# Patient Record
Sex: Female | Born: 1940 | Race: White | Hispanic: No | Smoking: Never smoker
Health system: Southern US, Community
[De-identification: ages and names within clinical notes are randomized; demographics above are authoritative.]

## PROBLEM LIST (undated history)

## (undated) DIAGNOSIS — R251 Tremor, unspecified: Secondary | ICD-10-CM

## (undated) DIAGNOSIS — Z800 Family history of malignant neoplasm of digestive organs: Secondary | ICD-10-CM

## (undated) DIAGNOSIS — M6530 Trigger finger, unspecified finger: Secondary | ICD-10-CM

## (undated) DIAGNOSIS — Z98811 Dental restoration status: Secondary | ICD-10-CM

## (undated) DIAGNOSIS — R350 Frequency of micturition: Secondary | ICD-10-CM

## (undated) DIAGNOSIS — K219 Gastro-esophageal reflux disease without esophagitis: Secondary | ICD-10-CM

## (undated) DIAGNOSIS — G20A1 Parkinson's disease without dyskinesia, without mention of fluctuations: Secondary | ICD-10-CM

## (undated) DIAGNOSIS — K649 Unspecified hemorrhoids: Secondary | ICD-10-CM

## (undated) DIAGNOSIS — R112 Nausea with vomiting, unspecified: Secondary | ICD-10-CM

## (undated) DIAGNOSIS — Z8052 Family history of malignant neoplasm of bladder: Secondary | ICD-10-CM

## (undated) DIAGNOSIS — E785 Hyperlipidemia, unspecified: Secondary | ICD-10-CM

## (undated) DIAGNOSIS — J302 Other seasonal allergic rhinitis: Secondary | ICD-10-CM

## (undated) DIAGNOSIS — L309 Dermatitis, unspecified: Secondary | ICD-10-CM

## (undated) DIAGNOSIS — Z9889 Other specified postprocedural states: Secondary | ICD-10-CM

## (undated) DIAGNOSIS — I10 Essential (primary) hypertension: Secondary | ICD-10-CM

## (undated) DIAGNOSIS — IMO0002 Reserved for concepts with insufficient information to code with codable children: Secondary | ICD-10-CM

## (undated) DIAGNOSIS — G20 Parkinson's disease: Secondary | ICD-10-CM

## (undated) DIAGNOSIS — Z806 Family history of leukemia: Secondary | ICD-10-CM

## (undated) DIAGNOSIS — C50919 Malignant neoplasm of unspecified site of unspecified female breast: Secondary | ICD-10-CM

## (undated) DIAGNOSIS — Z8041 Family history of malignant neoplasm of ovary: Secondary | ICD-10-CM

## (undated) HISTORY — DX: Family history of malignant neoplasm of bladder: Z80.52

## (undated) HISTORY — DX: Malignant neoplasm of unspecified site of unspecified female breast: C50.919

## (undated) HISTORY — PX: KNEE ARTHROSCOPY: SUR90

## (undated) HISTORY — DX: Hyperlipidemia, unspecified: E78.5

## (undated) HISTORY — PX: CATARACT EXTRACTION, BILATERAL: SHX1313

## (undated) HISTORY — DX: Parkinson's disease: G20

## (undated) HISTORY — PX: TUMOR EXCISION: SHX421

## (undated) HISTORY — PX: APPENDECTOMY: SHX54

## (undated) HISTORY — DX: Family history of leukemia: Z80.6

## (undated) HISTORY — DX: Essential (primary) hypertension: I10

## (undated) HISTORY — DX: Family history of malignant neoplasm of ovary: Z80.41

## (undated) HISTORY — DX: Tremor, unspecified: R25.1

## (undated) HISTORY — DX: Parkinson's disease without dyskinesia, without mention of fluctuations: G20.A1

## (undated) HISTORY — PX: NASAL SINUS SURGERY: SHX719

## (undated) HISTORY — DX: Family history of malignant neoplasm of digestive organs: Z80.0

---

## 1967-02-23 DIAGNOSIS — C4912 Malignant neoplasm of connective and soft tissue of left upper limb, including shoulder: Secondary | ICD-10-CM

## 1967-02-23 HISTORY — DX: Malignant neoplasm of connective and soft tissue of left upper limb, including shoulder: C49.12

## 1969-02-22 DIAGNOSIS — B0239 Other herpes zoster eye disease: Secondary | ICD-10-CM

## 1969-02-22 DIAGNOSIS — Z8619 Personal history of other infectious and parasitic diseases: Secondary | ICD-10-CM

## 1969-02-22 HISTORY — DX: Other herpes zoster eye disease: B02.39

## 1969-02-22 HISTORY — DX: Personal history of other infectious and parasitic diseases: Z86.19

## 1997-02-22 HISTORY — PX: BREAST SURGERY: SHX581

## 1997-10-03 ENCOUNTER — Other Ambulatory Visit: Admission: RE | Admit: 1997-10-03 | Discharge: 1997-10-03 | Payer: Self-pay | Admitting: Obstetrics and Gynecology

## 1998-10-08 ENCOUNTER — Other Ambulatory Visit: Admission: RE | Admit: 1998-10-08 | Discharge: 1998-10-08 | Payer: Self-pay | Admitting: Obstetrics and Gynecology

## 1999-10-13 ENCOUNTER — Emergency Department (HOSPITAL_COMMUNITY): Admission: EM | Admit: 1999-10-13 | Discharge: 1999-10-13 | Payer: Self-pay | Admitting: Emergency Medicine

## 1999-10-13 ENCOUNTER — Encounter: Payer: Self-pay | Admitting: Emergency Medicine

## 1999-11-10 ENCOUNTER — Other Ambulatory Visit: Admission: RE | Admit: 1999-11-10 | Discharge: 1999-11-10 | Payer: Self-pay | Admitting: Obstetrics and Gynecology

## 1999-11-27 ENCOUNTER — Other Ambulatory Visit: Admission: RE | Admit: 1999-11-27 | Discharge: 1999-11-27 | Payer: Self-pay | Admitting: Obstetrics and Gynecology

## 1999-11-27 ENCOUNTER — Encounter (INDEPENDENT_AMBULATORY_CARE_PROVIDER_SITE_OTHER): Payer: Self-pay

## 2000-12-02 ENCOUNTER — Other Ambulatory Visit: Admission: RE | Admit: 2000-12-02 | Discharge: 2000-12-02 | Payer: Self-pay | Admitting: Obstetrics and Gynecology

## 2001-11-24 ENCOUNTER — Other Ambulatory Visit: Admission: RE | Admit: 2001-11-24 | Discharge: 2001-11-24 | Payer: Self-pay | Admitting: Obstetrics and Gynecology

## 2002-12-10 ENCOUNTER — Other Ambulatory Visit: Admission: RE | Admit: 2002-12-10 | Discharge: 2002-12-10 | Payer: Self-pay | Admitting: Obstetrics and Gynecology

## 2004-03-05 ENCOUNTER — Encounter: Admission: RE | Admit: 2004-03-05 | Discharge: 2004-03-05 | Payer: Self-pay | Admitting: Otolaryngology

## 2005-03-12 ENCOUNTER — Ambulatory Visit (HOSPITAL_COMMUNITY): Admission: RE | Admit: 2005-03-12 | Discharge: 2005-03-12 | Payer: Self-pay | Admitting: Orthopedic Surgery

## 2005-03-12 HISTORY — PX: KNEE ARTHROSCOPY: SUR90

## 2007-02-23 DIAGNOSIS — Z860100 Personal history of colon polyps, unspecified: Secondary | ICD-10-CM

## 2007-02-23 HISTORY — DX: Personal history of colon polyps, unspecified: Z86.0100

## 2007-02-23 HISTORY — DX: Personal history of colonic polyps: Z86.010

## 2008-04-22 DIAGNOSIS — Z905 Acquired absence of kidney: Secondary | ICD-10-CM

## 2008-04-22 HISTORY — PX: NEPHRECTOMY LIVING DONOR: SUR877

## 2008-04-22 HISTORY — DX: Acquired absence of kidney: Z90.5

## 2009-05-23 DIAGNOSIS — C50919 Malignant neoplasm of unspecified site of unspecified female breast: Secondary | ICD-10-CM

## 2009-05-23 HISTORY — DX: Malignant neoplasm of unspecified site of unspecified female breast: C50.919

## 2009-05-30 ENCOUNTER — Encounter: Admission: RE | Admit: 2009-05-30 | Discharge: 2009-05-30 | Payer: Self-pay | Admitting: Radiology

## 2009-06-12 ENCOUNTER — Encounter: Admission: RE | Admit: 2009-06-12 | Discharge: 2009-06-12 | Payer: Self-pay | Admitting: General Surgery

## 2009-06-16 ENCOUNTER — Ambulatory Visit (HOSPITAL_BASED_OUTPATIENT_CLINIC_OR_DEPARTMENT_OTHER): Admission: RE | Admit: 2009-06-16 | Discharge: 2009-06-16 | Payer: Self-pay | Admitting: General Surgery

## 2009-06-16 HISTORY — PX: BREAST LUMPECTOMY: SHX2

## 2009-07-01 ENCOUNTER — Ambulatory Visit (HOSPITAL_BASED_OUTPATIENT_CLINIC_OR_DEPARTMENT_OTHER): Admission: RE | Admit: 2009-07-01 | Discharge: 2009-07-01 | Payer: Self-pay | Admitting: General Surgery

## 2009-07-01 HISTORY — PX: BREAST LUMPECTOMY: SHX2

## 2009-07-16 ENCOUNTER — Ambulatory Visit: Payer: Self-pay | Admitting: Oncology

## 2009-07-23 ENCOUNTER — Ambulatory Visit: Admission: RE | Admit: 2009-07-23 | Discharge: 2009-09-03 | Payer: Self-pay | Admitting: Radiation Oncology

## 2009-07-23 LAB — CBC WITH DIFFERENTIAL/PLATELET
BASO%: 0.6 % (ref 0.0–2.0)
Basophils Absolute: 0 10*3/uL (ref 0.0–0.1)
EOS%: 15.5 % — ABNORMAL HIGH (ref 0.0–7.0)
Eosinophils Absolute: 0.6 10*3/uL — ABNORMAL HIGH (ref 0.0–0.5)
HCT: 35.9 % (ref 34.8–46.6)
HGB: 12.5 g/dL (ref 11.6–15.9)
LYMPH%: 35.6 % (ref 14.0–49.7)
MCH: 31.8 pg (ref 25.1–34.0)
MCHC: 34.8 g/dL (ref 31.5–36.0)
MCV: 91.4 fL (ref 79.5–101.0)
MONO#: 0.5 10*3/uL (ref 0.1–0.9)
MONO%: 11.4 % (ref 0.0–14.0)
NEUT#: 1.5 10*3/uL (ref 1.5–6.5)
NEUT%: 36.9 % — ABNORMAL LOW (ref 38.4–76.8)
Platelets: 307 10*3/uL (ref 145–400)
RBC: 3.93 10*6/uL (ref 3.70–5.45)
RDW: 12.4 % (ref 11.2–14.5)
WBC: 4.1 10*3/uL (ref 3.9–10.3)
lymph#: 1.5 10*3/uL (ref 0.9–3.3)

## 2009-07-23 LAB — COMPREHENSIVE METABOLIC PANEL
ALT: 13 U/L (ref 0–35)
AST: 16 U/L (ref 0–37)
Albumin: 4.3 g/dL (ref 3.5–5.2)
Alkaline Phosphatase: 53 U/L (ref 39–117)
BUN: 14 mg/dL (ref 6–23)
CO2: 25 mEq/L (ref 19–32)
Calcium: 9 mg/dL (ref 8.4–10.5)
Chloride: 98 mEq/L (ref 96–112)
Creatinine, Ser: 0.84 mg/dL (ref 0.40–1.20)
Glucose, Bld: 82 mg/dL (ref 70–99)
Potassium: 4.7 mEq/L (ref 3.5–5.3)
Sodium: 135 mEq/L (ref 135–145)
Total Bilirubin: 0.3 mg/dL (ref 0.3–1.2)
Total Protein: 7 g/dL (ref 6.0–8.3)

## 2009-07-23 LAB — CANCER ANTIGEN 27.29: CA 27.29: 27 U/mL (ref 0–39)

## 2009-08-22 ENCOUNTER — Ambulatory Visit: Payer: Self-pay | Admitting: Genetic Counselor

## 2009-09-02 ENCOUNTER — Ambulatory Visit: Payer: Self-pay | Admitting: Oncology

## 2009-10-22 ENCOUNTER — Ambulatory Visit: Payer: Self-pay | Admitting: Oncology

## 2010-05-06 ENCOUNTER — Other Ambulatory Visit: Payer: Self-pay | Admitting: Obstetrics and Gynecology

## 2010-05-07 ENCOUNTER — Other Ambulatory Visit: Payer: Self-pay | Admitting: Oncology

## 2010-05-07 ENCOUNTER — Encounter (HOSPITAL_BASED_OUTPATIENT_CLINIC_OR_DEPARTMENT_OTHER): Payer: Medicare Other | Admitting: Oncology

## 2010-05-07 DIAGNOSIS — Z170 Estrogen receptor positive status [ER+]: Secondary | ICD-10-CM

## 2010-05-07 DIAGNOSIS — C50119 Malignant neoplasm of central portion of unspecified female breast: Secondary | ICD-10-CM

## 2010-05-07 LAB — CBC WITH DIFFERENTIAL/PLATELET
BASO%: 0.6 % (ref 0.0–2.0)
Basophils Absolute: 0 10*3/uL (ref 0.0–0.1)
EOS%: 7.7 % — ABNORMAL HIGH (ref 0.0–7.0)
Eosinophils Absolute: 0.4 10*3/uL (ref 0.0–0.5)
HCT: 37.2 % (ref 34.8–46.6)
HGB: 12.7 g/dL (ref 11.6–15.9)
LYMPH%: 25 % (ref 14.0–49.7)
MCH: 30.4 pg (ref 25.1–34.0)
MCHC: 34.1 g/dL (ref 31.5–36.0)
MCV: 89 fL (ref 79.5–101.0)
MONO#: 0.6 10*3/uL (ref 0.1–0.9)
MONO%: 11 % (ref 0.0–14.0)
NEUT#: 2.9 10*3/uL (ref 1.5–6.5)
NEUT%: 55.7 % (ref 38.4–76.8)
Platelets: 264 10*3/uL (ref 145–400)
RBC: 4.18 10*6/uL (ref 3.70–5.45)
RDW: 12.4 % (ref 11.2–14.5)
WBC: 5.2 10*3/uL (ref 3.9–10.3)
lymph#: 1.3 10*3/uL (ref 0.9–3.3)

## 2010-05-08 LAB — CANCER ANTIGEN 27.29: CA 27.29: 28 U/mL (ref 0–39)

## 2010-05-08 LAB — COMPREHENSIVE METABOLIC PANEL
ALT: 10 U/L (ref 0–35)
AST: 17 U/L (ref 0–37)
Albumin: 4.4 g/dL (ref 3.5–5.2)
Alkaline Phosphatase: 64 U/L (ref 39–117)
BUN: 14 mg/dL (ref 6–23)
CO2: 25 mEq/L (ref 19–32)
Calcium: 9.3 mg/dL (ref 8.4–10.5)
Chloride: 99 mEq/L (ref 96–112)
Creatinine, Ser: 0.87 mg/dL (ref 0.40–1.20)
Glucose, Bld: 84 mg/dL (ref 70–99)
Potassium: 4.2 mEq/L (ref 3.5–5.3)
Sodium: 136 mEq/L (ref 135–145)
Total Bilirubin: 0.3 mg/dL (ref 0.3–1.2)
Total Protein: 7.4 g/dL (ref 6.0–8.3)

## 2010-05-08 LAB — VITAMIN D 25 HYDROXY (VIT D DEFICIENCY, FRACTURES): Vit D, 25-Hydroxy: 63 ng/mL (ref 30–89)

## 2010-05-12 LAB — BASIC METABOLIC PANEL
BUN: 10 mg/dL (ref 6–23)
CO2: 27 mEq/L (ref 19–32)
Calcium: 9.1 mg/dL (ref 8.4–10.5)
Chloride: 100 mEq/L (ref 96–112)
Creatinine, Ser: 0.78 mg/dL (ref 0.4–1.2)
GFR calc Af Amer: >60 mL/min (ref >60)
GFR calc non Af Amer: >60 mL/min (ref >60)
Glucose, Bld: 99 mg/dL (ref 70–99)
Potassium: 4.6 mEq/L (ref 3.5–5.1)
Sodium: 134 mEq/L — ABNORMAL LOW (ref 135–145)

## 2010-05-12 LAB — POCT HEMOGLOBIN-HEMACUE
Hemoglobin: 12.9 g/dL (ref 12.0–15.0)
Hemoglobin: 13.2 g/dL (ref 12.0–15.0)

## 2010-05-14 ENCOUNTER — Encounter (HOSPITAL_BASED_OUTPATIENT_CLINIC_OR_DEPARTMENT_OTHER): Payer: Medicare Other | Admitting: Oncology

## 2010-05-14 DIAGNOSIS — C50119 Malignant neoplasm of central portion of unspecified female breast: Secondary | ICD-10-CM

## 2010-05-14 DIAGNOSIS — Z170 Estrogen receptor positive status [ER+]: Secondary | ICD-10-CM

## 2010-07-10 NOTE — Op Note (Signed)
[name], April Church           ACCOUNT NO.:  [id number]   MEDICAL RECORD NO.:  [id number]          PATIENT TYPE:  AMB   LOCATION:  DAY                          FACILITY:  [facility]   PHYSICIAN:  Marlowe Kays, M.D.  DATE OF BIRTH:  1941/01/29   DATE OF PROCEDURE:  03/12/2005  DATE OF DISCHARGE:                                 OPERATIVE REPORT   PREOPERATIVE DIAGNOSIS:  Torn meniscus right knee.   POSTOPERATIVE DIAGNOSES:  1.  Torn medial meniscus.  2.  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  3.  Large suprapatellar plica, right knee.   OPERATION:  Right knee arthroscopy with 1) partial medial meniscectomy, 2)  debridement of patella, 3) excision of suprapatellar plica.   SURGEON:  Marlowe Kays, M.D.   ASSISTANT:  Nurse.   ANESTHESIA:  General.   PATHOLOGY AND JUSTIFICATION FOR PROCEDURE:  She has had medial right knee  pain for a number of months. This has become progressive in nature with an  MRI demonstrating a complex tear of the posterior medial meniscus. She has a  history of left knee arthroscopy years ago with a good result. See operative  description below for additional pathology.   DESCRIPTION OF PROCEDURE:  Satisfactory general anesthesia, pneumatic  tourniquet, leg was esmarched out nonsterilely and then thigh stabilizer was  applied. Ace wrap and knee support to the left knee, right leg was prepped  from stabilizer to ankle with DuraPrep, draped in a sterile field. Superior  medial saline inflow. First through an anterolateral portal, the medial  compartment of the knee joint was evaluated, performed a limited synovectomy  anteriorly getting better visualization posteriorly. At this point, I noted  the inflow was a problem and I just had a hard time getting consistent  inflow. Looking up into the suprapatellar area, I found that she had an  extensive plica throughout the entire suprapatellar area just superior to  the patella and that my  inflow nozzle had broke the hole through it in one  location, but was obviously a deterrent to continued satisfactory flow and I  excised the plicae with the 3.5 shaver. She also had full-thickness  chondromalacia of the medial facette of the patella which I smoothed down  with the same. I then went back to the medial joint and had smooth inflow,  found substantial tear involving the entire posterior 40% of the meniscus. I  resected a large tongue or flap with scissors and trimmed up the remainder  with baskets and then smoothed it down with a 3.5 shaver until the small  residual rim was nice and stable on probing at the posterior curve. This  went back almost to the root of the meniscus. I then reversed portals  laterally, she had a normal looking joint. A representative picture was  taken. The knee joint was then irrigated until clear and all fluid possible  removed. The two anterior portals were closed with 4-0 nylon, 20 mL of  0.5%  Marcaine with adrenalin was then instilled through the inflow apparatus  which I removed and closed this portal with 4-0 nylon as  well. Betadine adaptic dry sterile dressing were applied, tourniquet was  released. She tolerated the procedure well and at the time of this dictation  she was on her way to the recovery room in satisfactory condition with no  known complications.           ______________________________  Marlowe Kays, M.D.     JA/MEDQ  D:  03/12/2005  T:  03/12/2005  Job:  [id number]

## 2010-07-30 ENCOUNTER — Encounter (INDEPENDENT_AMBULATORY_CARE_PROVIDER_SITE_OTHER): Payer: Self-pay | Admitting: General Surgery

## 2010-09-10 ENCOUNTER — Encounter (INDEPENDENT_AMBULATORY_CARE_PROVIDER_SITE_OTHER): Payer: Self-pay | Admitting: General Surgery

## 2010-09-10 ENCOUNTER — Ambulatory Visit (INDEPENDENT_AMBULATORY_CARE_PROVIDER_SITE_OTHER): Payer: Medicare Other | Admitting: General Surgery

## 2010-09-10 VITALS — BP 130/80 | HR 64 | Temp 97.0°F | Ht 62.0 in | Wt 127.4 lb

## 2010-09-10 DIAGNOSIS — C50919 Malignant neoplasm of unspecified site of unspecified female breast: Secondary | ICD-10-CM

## 2010-09-10 DIAGNOSIS — C50512 Malignant neoplasm of lower-outer quadrant of left female breast: Secondary | ICD-10-CM | POA: Insufficient documentation

## 2010-09-10 NOTE — Patient Instructions (Signed)
Call as needed for problems or questions 

## 2010-09-10 NOTE — Progress Notes (Signed)
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 She completed radiation and is on anti estrogen Rx without any C/O side effects. No breast lumps, pain or other concerns.  PE Breasts:No mass or skin changes with negative exam of lumpectomy site LNs: No cervical, supraclavicular or axillary nodes palpable  Recent mammogram at [facility] negative  A/P Doing well with no evidence of recurrent or new breast CA        Return in 6 mo

## 2010-10-27 ENCOUNTER — Other Ambulatory Visit: Payer: Self-pay | Admitting: Gastroenterology

## 2010-11-13 ENCOUNTER — Encounter (HOSPITAL_BASED_OUTPATIENT_CLINIC_OR_DEPARTMENT_OTHER): Payer: Medicare Other | Admitting: Oncology

## 2010-11-13 ENCOUNTER — Other Ambulatory Visit: Payer: Self-pay | Admitting: Oncology

## 2010-11-13 DIAGNOSIS — Z170 Estrogen receptor positive status [ER+]: Secondary | ICD-10-CM

## 2010-11-13 DIAGNOSIS — C50119 Malignant neoplasm of central portion of unspecified female breast: Secondary | ICD-10-CM

## 2010-11-13 LAB — CBC WITH DIFFERENTIAL/PLATELET
BASO%: 1 % (ref 0.0–2.0)
Basophils Absolute: 0 10*3/uL (ref 0.0–0.1)
EOS%: 10.4 % — ABNORMAL HIGH (ref 0.0–7.0)
Eosinophils Absolute: 0.4 10*3/uL (ref 0.0–0.5)
HCT: 33.7 % — ABNORMAL LOW (ref 34.8–46.6)
HGB: 11.7 g/dL (ref 11.6–15.9)
LYMPH%: 27.7 % (ref 14.0–49.7)
MCH: 31.7 pg (ref 25.1–34.0)
MCHC: 34.7 g/dL (ref 31.5–36.0)
MCV: 91.3 fL (ref 79.5–101.0)
MONO#: 0.4 10*3/uL (ref 0.1–0.9)
MONO%: 9.7 % (ref 0.0–14.0)
NEUT#: 2.2 10*3/uL (ref 1.5–6.5)
NEUT%: 51.2 % (ref 38.4–76.8)
Platelets: 288 10*3/uL (ref 145–400)
RBC: 3.7 10*6/uL (ref 3.70–5.45)
RDW: 11.9 % (ref 11.2–14.5)
WBC: 4.3 10*3/uL (ref 3.9–10.3)
lymph#: 1.2 10*3/uL (ref 0.9–3.3)

## 2010-11-14 LAB — COMPREHENSIVE METABOLIC PANEL
ALT: 12 U/L (ref 0–35)
AST: 17 U/L (ref 0–37)
Albumin: 4.3 g/dL (ref 3.5–5.2)
Alkaline Phosphatase: 69 U/L (ref 39–117)
BUN: 22 mg/dL (ref 6–23)
CO2: 24 mEq/L (ref 19–32)
Calcium: 8.5 mg/dL (ref 8.4–10.5)
Chloride: 97 mEq/L (ref 96–112)
Creatinine, Ser: 0.78 mg/dL (ref 0.50–1.10)
Glucose, Bld: 137 mg/dL — ABNORMAL HIGH (ref 70–99)
Potassium: 4.7 mEq/L (ref 3.5–5.3)
Sodium: 134 mEq/L — ABNORMAL LOW (ref 135–145)
Total Bilirubin: 0.2 mg/dL — ABNORMAL LOW (ref 0.3–1.2)
Total Protein: 6.9 g/dL (ref 6.0–8.3)

## 2010-11-14 LAB — VITAMIN D 25 HYDROXY (VIT D DEFICIENCY, FRACTURES): Vit D, 25-Hydroxy: 83 ng/mL (ref 30–89)

## 2010-11-20 ENCOUNTER — Encounter (HOSPITAL_BASED_OUTPATIENT_CLINIC_OR_DEPARTMENT_OTHER): Payer: Medicare Other | Admitting: Oncology

## 2010-11-20 DIAGNOSIS — C50119 Malignant neoplasm of central portion of unspecified female breast: Secondary | ICD-10-CM

## 2010-11-20 DIAGNOSIS — Z170 Estrogen receptor positive status [ER+]: Secondary | ICD-10-CM

## 2011-01-18 ENCOUNTER — Telehealth: Payer: Self-pay | Admitting: *Deleted

## 2011-01-18 NOTE — Telephone Encounter (Signed)
Returned patient's call r/t side-effects of Tamoxifen.  Pt states she has muscle pain and is wondering if this is a side effect.  RN advised that bone pain can be a side effect of Tamoxifen.  Pt states that she can take Tylenol and will call if side effects increase or if she has no relief.

## 2011-03-11 ENCOUNTER — Encounter (INDEPENDENT_AMBULATORY_CARE_PROVIDER_SITE_OTHER): Payer: Self-pay | Admitting: General Surgery

## 2011-03-11 ENCOUNTER — Ambulatory Visit (INDEPENDENT_AMBULATORY_CARE_PROVIDER_SITE_OTHER): Payer: Medicare Other | Admitting: General Surgery

## 2011-03-11 VITALS — BP 124/84 | HR 92 | Resp 20 | Ht 62.0 in | Wt 122.0 lb

## 2011-03-11 DIAGNOSIS — N6459 Other signs and symptoms in breast: Secondary | ICD-10-CM | POA: Diagnosis not present

## 2011-03-11 DIAGNOSIS — C50919 Malignant neoplasm of unspecified site of unspecified female breast: Secondary | ICD-10-CM | POA: Diagnosis not present

## 2011-03-11 DIAGNOSIS — N63 Unspecified lump in unspecified breast: Secondary | ICD-10-CM | POA: Diagnosis not present

## 2011-03-11 DIAGNOSIS — Z803 Family history of malignant neoplasm of breast: Secondary | ICD-10-CM | POA: Diagnosis not present

## 2011-03-11 NOTE — Progress Notes (Signed)
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 She has been on hormonal treatment post radiation. She is getting some aching in her legs and she wonders if this is due to the tamoxifen. She will discuss this with Dr. Donnie Coffin. On questioning she has been able to feel a small lump just beneath her left nipple for a couple of months. She is not sure if this is new or not.  Exam: Gen.: Petite female in no acute distress Skin: Warm and dry without rash or infection Lymph nodes: No palpable cervical, subclavicular, or axillary nodes Lungs: Clear without wheezing, breath sounds equal Breasts: Status post reduction bilaterally. Well-healed incisions. Just beneath the superior area on the left is approximately 8 mm discrete superficial mass. I cannot feel any other masses in either breast.  Imaging: Due in March  Assessment and plan: History of left breast cancer status post lumpectomy and negative sentinel lymph node biopsy, radiation and hormonal treatment. Possible new mass beneath the left nipple versus scar from previous surgery. This may be new and we will go ahead and remove up her imaging with mammogram and ultrasound at [facility] and I will call her with the results. We'll see her no later than 6 months.

## 2011-03-11 NOTE — Patient Instructions (Signed)
We will discuss results of your mammogram and ultrasound as soon as they are complete

## 2011-03-12 ENCOUNTER — Telehealth (INDEPENDENT_AMBULATORY_CARE_PROVIDER_SITE_OTHER): Payer: Self-pay

## 2011-03-12 NOTE — Telephone Encounter (Signed)
Signature request from [facility] for Left Breast Core Biopsy on 03/16/11, order signed by Dr. Daphine Deutscher (Dr. Johna Sheriff on vac) faxed to [phone].

## 2011-03-16 ENCOUNTER — Other Ambulatory Visit: Payer: Self-pay

## 2011-03-16 DIAGNOSIS — R928 Other abnormal and inconclusive findings on diagnostic imaging of breast: Secondary | ICD-10-CM | POA: Diagnosis not present

## 2011-03-16 DIAGNOSIS — N6039 Fibrosclerosis of unspecified breast: Secondary | ICD-10-CM | POA: Diagnosis not present

## 2011-03-16 DIAGNOSIS — Z0189 Encounter for other specified special examinations: Secondary | ICD-10-CM | POA: Diagnosis not present

## 2011-03-18 ENCOUNTER — Encounter (INDEPENDENT_AMBULATORY_CARE_PROVIDER_SITE_OTHER): Payer: Self-pay

## 2011-03-31 ENCOUNTER — Encounter: Payer: Self-pay | Admitting: Oncology

## 2011-04-01 DIAGNOSIS — J309 Allergic rhinitis, unspecified: Secondary | ICD-10-CM | POA: Diagnosis not present

## 2011-04-01 DIAGNOSIS — J45909 Unspecified asthma, uncomplicated: Secondary | ICD-10-CM | POA: Diagnosis not present

## 2011-04-01 DIAGNOSIS — T782XXA Anaphylactic shock, unspecified, initial encounter: Secondary | ICD-10-CM | POA: Diagnosis not present

## 2011-04-01 DIAGNOSIS — Z87892 Personal history of anaphylaxis: Secondary | ICD-10-CM | POA: Diagnosis not present

## 2011-04-02 ENCOUNTER — Telehealth: Payer: Self-pay | Admitting: Oncology

## 2011-04-02 NOTE — Telephone Encounter (Signed)
called pts him s/w husband and he asked that I mail pts appt for march 2013.  pt is scheduled to have mammogram on 09/14/2011 @ [facility] and bone density on 07/30/2011 @ [facility].  will mail appt to pt today

## 2011-04-03 ENCOUNTER — Telehealth: Payer: Self-pay | Admitting: Oncology

## 2011-04-03 NOTE — Telephone Encounter (Signed)
called pts home [facility] for appt in march2013.  called [facility] and they stated that pt has a mammogram on 07/23 and bone density on 06/07

## 2011-04-05 DIAGNOSIS — N6009 Solitary cyst of unspecified breast: Secondary | ICD-10-CM | POA: Diagnosis not present

## 2011-04-05 DIAGNOSIS — Z09 Encounter for follow-up examination after completed treatment for conditions other than malignant neoplasm: Secondary | ICD-10-CM | POA: Diagnosis not present

## 2011-04-12 ENCOUNTER — Encounter (INDEPENDENT_AMBULATORY_CARE_PROVIDER_SITE_OTHER): Payer: Self-pay

## 2011-04-20 DIAGNOSIS — IMO0001 Reserved for inherently not codable concepts without codable children: Secondary | ICD-10-CM | POA: Diagnosis not present

## 2011-04-20 DIAGNOSIS — I10 Essential (primary) hypertension: Secondary | ICD-10-CM | POA: Diagnosis not present

## 2011-04-20 DIAGNOSIS — Z0000 Encounter for general adult medical examination without abnormal findings: Secondary | ICD-10-CM | POA: Diagnosis not present

## 2011-05-21 ENCOUNTER — Ambulatory Visit (HOSPITAL_BASED_OUTPATIENT_CLINIC_OR_DEPARTMENT_OTHER): Payer: Medicare Other | Admitting: Oncology

## 2011-05-21 ENCOUNTER — Other Ambulatory Visit (HOSPITAL_BASED_OUTPATIENT_CLINIC_OR_DEPARTMENT_OTHER): Payer: Medicare Other | Admitting: Lab

## 2011-05-21 ENCOUNTER — Telehealth: Payer: Self-pay | Admitting: *Deleted

## 2011-05-21 VITALS — BP 127/83 | HR 89 | Temp 98.1°F | Ht 62.0 in | Wt 123.4 lb

## 2011-05-21 DIAGNOSIS — C50919 Malignant neoplasm of unspecified site of unspecified female breast: Secondary | ICD-10-CM

## 2011-05-21 DIAGNOSIS — Z170 Estrogen receptor positive status [ER+]: Secondary | ICD-10-CM

## 2011-05-21 DIAGNOSIS — M79609 Pain in unspecified limb: Secondary | ICD-10-CM

## 2011-05-21 DIAGNOSIS — M899 Disorder of bone, unspecified: Secondary | ICD-10-CM

## 2011-05-21 DIAGNOSIS — C50119 Malignant neoplasm of central portion of unspecified female breast: Secondary | ICD-10-CM

## 2011-05-21 DIAGNOSIS — E559 Vitamin D deficiency, unspecified: Secondary | ICD-10-CM

## 2011-05-21 DIAGNOSIS — M949 Disorder of cartilage, unspecified: Secondary | ICD-10-CM

## 2011-05-21 LAB — CBC WITH DIFFERENTIAL/PLATELET
BASO%: 1.3 % (ref 0.0–2.0)
Basophils Absolute: 0 10*3/uL (ref 0.0–0.1)
EOS%: 10.8 % — ABNORMAL HIGH (ref 0.0–7.0)
Eosinophils Absolute: 0.4 10*3/uL (ref 0.0–0.5)
HCT: 33.6 % — ABNORMAL LOW (ref 34.8–46.6)
HGB: 11.4 g/dL — ABNORMAL LOW (ref 11.6–15.9)
LYMPH%: 38.6 % (ref 14.0–49.7)
MCH: 31.4 pg (ref 25.1–34.0)
MCHC: 33.8 g/dL (ref 31.5–36.0)
MCV: 92.8 fL (ref 79.5–101.0)
MONO#: 0.4 10*3/uL (ref 0.1–0.9)
MONO%: 10.3 % (ref 0.0–14.0)
NEUT#: 1.4 10*3/uL — ABNORMAL LOW (ref 1.5–6.5)
NEUT%: 39 % (ref 38.4–76.8)
Platelets: 242 10*3/uL (ref 145–400)
RBC: 3.63 10*6/uL — ABNORMAL LOW (ref 3.70–5.45)
RDW: 11.8 % (ref 11.2–14.5)
WBC: 3.5 10*3/uL — ABNORMAL LOW (ref 3.9–10.3)
lymph#: 1.3 10*3/uL (ref 0.9–3.3)
nRBC: 0 % (ref 0–0)

## 2011-05-21 MED ORDER — ANASTROZOLE 1 MG PO TABS: 1.0000 mg | ORAL_TABLET | Freq: Every day | ORAL | Status: DC

## 2011-05-21 NOTE — Progress Notes (Signed)
Hematology and Oncology Follow Up Visit  April Church [id number] Sep 25, 1940 71 y.o. 05/21/2011 4:15 PM PCP  Principle Diagnosis: T1C No er/pr+ breast cancer s/p lumpectomy 06/16/09, xrt complted 09/03/09, s/p femara x 2 y now on tamoxifen.  Interim History:  There have been no intercurrent illness, hospitalizations or medication changes. She thinks she has more joint pain on tamoxifen.Had a recent  biopsy of the left breast which turned out to be fibrosis.  Medications: I have reviewed the patient's current medications.  Allergies:  Allergies  Allergen Reactions  . Sulfa Drugs Cross Reactors Anxiety and Rash  . Codeine Anxiety    Past Medical History, Surgical history, Social history, and Family History were reviewed and updated.  Review of Systems: Constitutional:  Negative for fever, chills, night sweats, anorexia, weight loss, pain. Cardiovascular: no chest pain or dyspnea on exertion Respiratory: no cough, shortness of breath, or wheezing Neurological: no TIA or stroke symptoms Dermatological: negative ENT: negative Skin Gastrointestinal: no abdominal pain, change in bowel habits, or black or bloody stools Genito-Urinary: no dysuria, trouble voiding, or hematuria Hematological and Lymphatic: negative Breast: negative Musculoskeletal: negative Remaining ROS negative.  Physical Exam: Blood pressure 127/83, pulse 89, temperature 98.1 F (36.7 C), temperature source Oral, height 5\' 2"  (1.575 m), weight 123 lb 6.4 oz (55.974 kg). ECOG: 0 General appearance: alert, cooperative and appears stated age Head: Normocephalic, without obvious abnormality, atraumatic Neck: no adenopathy, no carotid bruit, no JVD, supple, symmetrical, trachea midline and thyroid not enlarged, symmetric, no tenderness/mass/nodules Lymph nodes: Cervical, supraclavicular, and axillary nodes normal. Cardiac : regular rate and rhythm, no murmurs or gallops Pulmonary:clear to auscultation bilaterally  and normal percussion bilaterally Breasts: inspection negative, no nipple discharge or bleeding, no masses or nodularity palpable, no obvious evidence of local recurrence.both axillae are normal. Abdomen:soft, non-tender; bowel sounds normal; no masses,  no organomegaly Extremities negative Neuro: alert, oriented, normal speech, no focal findings or movement disorder noted  Lab Results: Lab Results  Component Value Date   WBC 3.5* 05/21/2011   HGB 11.4* 05/21/2011   HCT 33.6* 05/21/2011   MCV 92.8 05/21/2011   PLT 242 05/21/2011     Chemistry      Component Value Date/Time   NA 134* 11/13/2010 1315   NA 134* 11/13/2010 1315   NA 134* 11/13/2010 1315   K 4.7 11/13/2010 1315   K 4.7 11/13/2010 1315   K 4.7 11/13/2010 1315   CL 97 11/13/2010 1315   CL 97 11/13/2010 1315   CL 97 11/13/2010 1315   CO2 24 11/13/2010 1315   CO2 24 11/13/2010 1315   CO2 24 11/13/2010 1315   BUN 22 11/13/2010 1315   BUN 22 11/13/2010 1315   BUN 22 11/13/2010 1315   CREATININE 0.78 11/13/2010 1315   CREATININE 0.78 11/13/2010 1315   CREATININE 0.78 11/13/2010 1315      Component Value Date/Time   CALCIUM 8.5 11/13/2010 1315   CALCIUM 8.5 11/13/2010 1315   CALCIUM 8.5 11/13/2010 1315   ALKPHOS 69 11/13/2010 1315   ALKPHOS 69 11/13/2010 1315   ALKPHOS 69 11/13/2010 1315   AST 17 11/13/2010 1315   AST 17 11/13/2010 1315   AST 17 11/13/2010 1315   ALT 12 11/13/2010 1315   ALT 12 11/13/2010 1315   ALT 12 11/13/2010 1315   BILITOT 0.2* 11/13/2010 1315   BILITOT 0.2* 11/13/2010 1315   BILITOT 0.2* 11/13/2010 1315      .pathology. Radiological Studies: chest X-ray n/a Mammogram 03/17/11- wnl  Bone density n/a  Impression and Plan: 71 yo woman with hx of breast cancer on AI therapy doing well with out evidence of recurrence.I will see her in 6 months.  More than 50% of the visit was spent in patient-related counselling   Pierce Crane, MD 3/29/20134:15 PM

## 2011-05-21 NOTE — Telephone Encounter (Signed)
gave patient appointment for 12-21-2011 printed out calendar and gave to the patient

## 2011-05-22 LAB — COMPREHENSIVE METABOLIC PANEL
ALT: 10 U/L (ref 0–35)
AST: 18 U/L (ref 0–37)
Albumin: 4 g/dL (ref 3.5–5.2)
Alkaline Phosphatase: 44 U/L (ref 39–117)
BUN: 15 mg/dL (ref 6–23)
CO2: 25 mEq/L (ref 19–32)
Calcium: 8.8 mg/dL (ref 8.4–10.5)
Chloride: 93 mEq/L — ABNORMAL LOW (ref 96–112)
Creatinine, Ser: 0.8 mg/dL (ref 0.50–1.10)
Glucose, Bld: 124 mg/dL — ABNORMAL HIGH (ref 70–99)
Potassium: 4.1 mEq/L (ref 3.5–5.3)
Sodium: 129 mEq/L — ABNORMAL LOW (ref 135–145)
Total Bilirubin: 0.2 mg/dL — ABNORMAL LOW (ref 0.3–1.2)
Total Protein: 6.7 g/dL (ref 6.0–8.3)

## 2011-05-22 LAB — VITAMIN D 25 HYDROXY (VIT D DEFICIENCY, FRACTURES): Vit D, 25-Hydroxy: 58 ng/mL (ref 30–89)

## 2011-06-07 DIAGNOSIS — M898X9 Other specified disorders of bone, unspecified site: Secondary | ICD-10-CM | POA: Diagnosis not present

## 2011-06-07 DIAGNOSIS — M21619 Bunion of unspecified foot: Secondary | ICD-10-CM | POA: Diagnosis not present

## 2011-06-07 DIAGNOSIS — M2040 Other hammer toe(s) (acquired), unspecified foot: Secondary | ICD-10-CM | POA: Diagnosis not present

## 2011-06-07 DIAGNOSIS — M2010 Hallux valgus (acquired), unspecified foot: Secondary | ICD-10-CM | POA: Diagnosis not present

## 2011-06-09 DIAGNOSIS — Z124 Encounter for screening for malignant neoplasm of cervix: Secondary | ICD-10-CM | POA: Diagnosis not present

## 2011-06-09 DIAGNOSIS — G250 Essential tremor: Secondary | ICD-10-CM | POA: Diagnosis not present

## 2011-06-09 DIAGNOSIS — Z01419 Encounter for gynecological examination (general) (routine) without abnormal findings: Secondary | ICD-10-CM | POA: Diagnosis not present

## 2011-06-10 DIAGNOSIS — Z1151 Encounter for screening for human papillomavirus (HPV): Secondary | ICD-10-CM | POA: Diagnosis not present

## 2011-06-10 DIAGNOSIS — Z01419 Encounter for gynecological examination (general) (routine) without abnormal findings: Secondary | ICD-10-CM | POA: Diagnosis not present

## 2011-06-10 DIAGNOSIS — Z124 Encounter for screening for malignant neoplasm of cervix: Secondary | ICD-10-CM | POA: Diagnosis not present

## 2011-06-14 DIAGNOSIS — M21619 Bunion of unspecified foot: Secondary | ICD-10-CM | POA: Diagnosis not present

## 2011-06-14 DIAGNOSIS — M2040 Other hammer toe(s) (acquired), unspecified foot: Secondary | ICD-10-CM | POA: Diagnosis not present

## 2011-06-14 DIAGNOSIS — M2010 Hallux valgus (acquired), unspecified foot: Secondary | ICD-10-CM | POA: Diagnosis not present

## 2011-06-18 DIAGNOSIS — J45909 Unspecified asthma, uncomplicated: Secondary | ICD-10-CM | POA: Diagnosis not present

## 2011-06-18 DIAGNOSIS — I10 Essential (primary) hypertension: Secondary | ICD-10-CM | POA: Diagnosis not present

## 2011-06-18 DIAGNOSIS — T782XXA Anaphylactic shock, unspecified, initial encounter: Secondary | ICD-10-CM | POA: Diagnosis not present

## 2011-06-18 DIAGNOSIS — Z524 Kidney donor: Secondary | ICD-10-CM | POA: Diagnosis not present

## 2011-06-18 DIAGNOSIS — R490 Dysphonia: Secondary | ICD-10-CM | POA: Diagnosis not present

## 2011-06-18 DIAGNOSIS — J309 Allergic rhinitis, unspecified: Secondary | ICD-10-CM | POA: Diagnosis not present

## 2011-06-22 DIAGNOSIS — M21619 Bunion of unspecified foot: Secondary | ICD-10-CM | POA: Diagnosis not present

## 2011-06-22 DIAGNOSIS — I10 Essential (primary) hypertension: Secondary | ICD-10-CM | POA: Diagnosis not present

## 2011-06-22 DIAGNOSIS — M6740 Ganglion, unspecified site: Secondary | ICD-10-CM | POA: Diagnosis not present

## 2011-06-22 DIAGNOSIS — M898X9 Other specified disorders of bone, unspecified site: Secondary | ICD-10-CM | POA: Diagnosis not present

## 2011-06-22 DIAGNOSIS — M21549 Acquired clubfoot, unspecified foot: Secondary | ICD-10-CM | POA: Diagnosis not present

## 2011-06-22 DIAGNOSIS — M2040 Other hammer toe(s) (acquired), unspecified foot: Secondary | ICD-10-CM | POA: Diagnosis not present

## 2011-06-22 DIAGNOSIS — M2010 Hallux valgus (acquired), unspecified foot: Secondary | ICD-10-CM | POA: Diagnosis not present

## 2011-06-22 HISTORY — PX: FOOT SURGERY: SHX648

## 2011-06-28 DIAGNOSIS — M2010 Hallux valgus (acquired), unspecified foot: Secondary | ICD-10-CM | POA: Diagnosis not present

## 2011-07-12 DIAGNOSIS — M2010 Hallux valgus (acquired), unspecified foot: Secondary | ICD-10-CM | POA: Diagnosis not present

## 2011-07-30 DIAGNOSIS — Z13820 Encounter for screening for osteoporosis: Secondary | ICD-10-CM | POA: Diagnosis not present

## 2011-08-05 DIAGNOSIS — T782XXA Anaphylactic shock, unspecified, initial encounter: Secondary | ICD-10-CM | POA: Diagnosis not present

## 2011-08-05 DIAGNOSIS — J45909 Unspecified asthma, uncomplicated: Secondary | ICD-10-CM | POA: Diagnosis not present

## 2011-08-05 DIAGNOSIS — K219 Gastro-esophageal reflux disease without esophagitis: Secondary | ICD-10-CM | POA: Diagnosis not present

## 2011-08-05 DIAGNOSIS — J309 Allergic rhinitis, unspecified: Secondary | ICD-10-CM | POA: Diagnosis not present

## 2011-08-09 DIAGNOSIS — M2010 Hallux valgus (acquired), unspecified foot: Secondary | ICD-10-CM | POA: Diagnosis not present

## 2011-09-01 ENCOUNTER — Other Ambulatory Visit: Payer: Self-pay | Admitting: Oncology

## 2011-09-01 DIAGNOSIS — C50919 Malignant neoplasm of unspecified site of unspecified female breast: Secondary | ICD-10-CM

## 2011-09-14 DIAGNOSIS — R928 Other abnormal and inconclusive findings on diagnostic imaging of breast: Secondary | ICD-10-CM | POA: Diagnosis not present

## 2011-09-14 DIAGNOSIS — Z09 Encounter for follow-up examination after completed treatment for conditions other than malignant neoplasm: Secondary | ICD-10-CM | POA: Diagnosis not present

## 2011-09-14 DIAGNOSIS — Z853 Personal history of malignant neoplasm of breast: Secondary | ICD-10-CM | POA: Diagnosis not present

## 2011-09-15 DIAGNOSIS — M6530 Trigger finger, unspecified finger: Secondary | ICD-10-CM | POA: Diagnosis not present

## 2011-10-08 ENCOUNTER — Ambulatory Visit (INDEPENDENT_AMBULATORY_CARE_PROVIDER_SITE_OTHER): Payer: Medicare Other | Admitting: General Surgery

## 2011-10-08 ENCOUNTER — Encounter (INDEPENDENT_AMBULATORY_CARE_PROVIDER_SITE_OTHER): Payer: Self-pay | Admitting: General Surgery

## 2011-10-08 VITALS — BP 116/86 | HR 97 | Temp 98.3°F | Ht 61.5 in | Wt 126.0 lb

## 2011-10-08 DIAGNOSIS — C50919 Malignant neoplasm of unspecified site of unspecified female breast: Secondary | ICD-10-CM

## 2011-10-08 NOTE — Progress Notes (Signed)
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 At her last visit she had some thickening beneath the nipple which subsequently was imaged and biopsied and showed only scar tissue. She feels this area has not changed. She has no other concerns about skin changes or lumps or unusual pain. She has been changed from tamoxifen to Arimadex due to side effects and is tolerating this well.  Exam: General: Well-appearing female Lymph nodes: No cervical, supraclavicular, or axillary nodes palpable Breasts: Healed reduction incisions bilaterally. There is some persistent thickening beneath the superior aspect of the left nipple which is unchanged from 6 months ago. No other palpable abnormalities in either breast.  Imaging: Mammogram last month at [facility] was negative for suspicious abnormalities  Assessment and plan: Doing well 2 years post diagnosis of left breast cancer with no clinical evidence of recurrence. Return in 6 months to

## 2011-10-11 DIAGNOSIS — R609 Edema, unspecified: Secondary | ICD-10-CM | POA: Diagnosis not present

## 2011-10-30 ENCOUNTER — Other Ambulatory Visit: Payer: Self-pay | Admitting: Oncology

## 2011-10-30 DIAGNOSIS — C50919 Malignant neoplasm of unspecified site of unspecified female breast: Secondary | ICD-10-CM

## 2011-11-01 DIAGNOSIS — M6530 Trigger finger, unspecified finger: Secondary | ICD-10-CM | POA: Diagnosis not present

## 2011-11-01 DIAGNOSIS — I10 Essential (primary) hypertension: Secondary | ICD-10-CM | POA: Diagnosis not present

## 2011-11-18 DIAGNOSIS — Z23 Encounter for immunization: Secondary | ICD-10-CM | POA: Diagnosis not present

## 2011-11-23 DIAGNOSIS — IMO0002 Reserved for concepts with insufficient information to code with codable children: Secondary | ICD-10-CM

## 2011-11-23 DIAGNOSIS — M6530 Trigger finger, unspecified finger: Secondary | ICD-10-CM

## 2011-11-23 HISTORY — DX: Trigger finger, unspecified finger: M65.30

## 2011-11-23 HISTORY — DX: Reserved for concepts with insufficient information to code with codable children: IMO0002

## 2011-11-29 DIAGNOSIS — H43399 Other vitreous opacities, unspecified eye: Secondary | ICD-10-CM | POA: Diagnosis not present

## 2011-11-29 DIAGNOSIS — H43819 Vitreous degeneration, unspecified eye: Secondary | ICD-10-CM | POA: Diagnosis not present

## 2011-11-29 DIAGNOSIS — H04129 Dry eye syndrome of unspecified lacrimal gland: Secondary | ICD-10-CM | POA: Diagnosis not present

## 2011-12-10 DIAGNOSIS — M6740 Ganglion, unspecified site: Secondary | ICD-10-CM | POA: Diagnosis not present

## 2011-12-10 DIAGNOSIS — M6530 Trigger finger, unspecified finger: Secondary | ICD-10-CM | POA: Diagnosis not present

## 2011-12-13 ENCOUNTER — Other Ambulatory Visit: Payer: Self-pay | Admitting: Orthopedic Surgery

## 2011-12-14 ENCOUNTER — Encounter (HOSPITAL_BASED_OUTPATIENT_CLINIC_OR_DEPARTMENT_OTHER): Payer: Self-pay | Admitting: *Deleted

## 2011-12-14 NOTE — Pre-Procedure Instructions (Addendum)
EKG req. from [facility] Pt. to come for BMET

## 2011-12-15 ENCOUNTER — Encounter (HOSPITAL_BASED_OUTPATIENT_CLINIC_OR_DEPARTMENT_OTHER)
Admission: RE | Admit: 2011-12-15 | Discharge: 2011-12-15 | Disposition: A | Discharge status: Home / Self Care | Payer: Medicare Other | Source: Ambulatory Visit | Attending: Orthopedic Surgery | Admitting: Orthopedic Surgery

## 2011-12-15 ENCOUNTER — Encounter (HOSPITAL_BASED_OUTPATIENT_CLINIC_OR_DEPARTMENT_OTHER): Payer: Self-pay | Admitting: Orthopedic Surgery

## 2011-12-15 DIAGNOSIS — I10 Essential (primary) hypertension: Secondary | ICD-10-CM | POA: Diagnosis not present

## 2011-12-15 DIAGNOSIS — M6530 Trigger finger, unspecified finger: Secondary | ICD-10-CM | POA: Diagnosis not present

## 2011-12-15 DIAGNOSIS — M6740 Ganglion, unspecified site: Secondary | ICD-10-CM | POA: Diagnosis not present

## 2011-12-15 LAB — BASIC METABOLIC PANEL
BUN: 15 mg/dL (ref 6–23)
CO2: 29 mEq/L (ref 19–32)
Calcium: 9.7 mg/dL (ref 8.4–10.5)
Chloride: 98 mEq/L (ref 96–112)
Creatinine, Ser: 0.81 mg/dL (ref 0.50–1.10)
GFR calc Af Amer: 83 mL/min — ABNORMAL LOW (ref >90)
GFR calc non Af Amer: 71 mL/min — ABNORMAL LOW (ref >90)
Glucose, Bld: 98 mg/dL (ref 70–99)
Potassium: 4.4 mEq/L (ref 3.5–5.1)
Sodium: 136 mEq/L (ref 135–145)

## 2011-12-15 NOTE — H&P (Signed)
April Church is an 71 y.o. female.   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  Has been injected x 2 with recurrence.  She would like trigger release.  Also has noted a bump at proximal finger flexion crease and would like that removed as well.  Painful with palpation and bothersome to her.  Past Medical History  Diagnosis Date  . Tremors of nervous system     hands  . GERD (gastroesophageal reflux disease)   . 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 Cyst of finger 11/2011    annular cyst right long finger  . Hypertension     under control, has been on med. x 2 yrs.  . Dental crowns present   . Asthma     daily inhaler    Past Surgical History  Procedure Date  . Nasal sinus surgery     x 2  . Foot surgery 06/22/11    left  . Knee arthroscopy 03/12/2005    right  . Nephrectomy living donor 04/2008  . Appendectomy age 52  . Tumor excision age 80    right arm  . Cataract extraction, bilateral   . Breast surgery 1999    reduction  . Breast lumpectomy 06/16/2009    left; SLN bx.  . Breast lumpectomy 07/01/2009    re-excision  . Knee arthroscopy     left    Family History  Problem Relation Age of Onset  . Kidney disease Mother   . Stroke Father   . Diabetes Brother   . Heart disease Brother   . Cancer Sister    Social History:  reports that she has never smoked. She has never used smokeless tobacco. She reports that she does not drink alcohol or use illicit drugs.  Allergies:  Allergies  Allergen Reactions  . Shellfish Allergy Shortness Of Breath  . Sulfa Drugs Cross Reactors Anxiety and Rash  . Codeine Anxiety    No prescriptions prior to admission    Results for orders placed during the hospital encounter of 12/16/11 (from the past 48 hour(s))  BASIC METABOLIC PANEL     Status: Abnormal   Collection Time   12/15/11 10:00 AM      Component Value Range Comment   Sodium 136  135 - 145 mEq/L    Potassium 4.4  3.5 - 5.1 mEq/L    Chloride 98  96 - 112 mEq/L    CO2 29  19 - 32 mEq/L    Glucose, Bld 98  70 - 99 mg/dL    BUN 15  6 - 23 mg/dL    Creatinine, Ser 1.61  0.50 - 1.10 mg/dL    Calcium 9.7  8.4 - 09.6 mg/dL    GFR calc non Af Amer 71 (*) >90 mL/min    GFR calc Af Amer 83 (*) >90 mL/min     No results found.   A comprehensive review of systems was negative except for: Eyes: positive for cataracts Ears, nose, mouth, throat, and face: positive for hoarseness  Height 5' 1.5" (1.562 m), weight 56.7 kg (125 lb).  General appearance: alert, cooperative and appears stated age Head: Normocephalic, without obvious abnormality, atraumatic Neck: supple, symmetrical, trachea midline Resp: clear to auscultation bilaterally Cardio: regular rate and rhythm GI: non tender Extremities: intact sensation and capillary refill all digits.  +epl/fpl/io.  palpable and tender flexor tendon nodule  right long finger and triggering.  palpable and tender cyst at proximal flexion crease of long finger. Pulses: 2+ and symmetric Skin: Skin color, texture, turgor normal. 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  Non operative and operative treatment options were discussed with the patient and patient wishes to proceed with operative treatment.  Risks, benefits, and alternatives of surgery were discussed and the patient agrees with the plan of care.   Nicoles Sedlacek R 12/15/2011, 9:35 PM

## 2011-12-16 ENCOUNTER — Ambulatory Visit (HOSPITAL_BASED_OUTPATIENT_CLINIC_OR_DEPARTMENT_OTHER)
Admission: RE | Admit: 2011-12-16 | Discharge: 2011-12-16 | Disposition: A | Discharge status: Home / Self Care | Payer: Medicare Other | Source: Ambulatory Visit | Attending: Orthopedic Surgery | Admitting: Orthopedic Surgery

## 2011-12-16 ENCOUNTER — Encounter (HOSPITAL_BASED_OUTPATIENT_CLINIC_OR_DEPARTMENT_OTHER)
Admission: RE | Disposition: A | Discharge status: Home / Self Care | Payer: Self-pay | Source: Ambulatory Visit | Attending: Orthopedic Surgery

## 2011-12-16 ENCOUNTER — Encounter (HOSPITAL_BASED_OUTPATIENT_CLINIC_OR_DEPARTMENT_OTHER): Payer: Self-pay | Admitting: *Deleted

## 2011-12-16 ENCOUNTER — Encounter (HOSPITAL_BASED_OUTPATIENT_CLINIC_OR_DEPARTMENT_OTHER): Payer: Self-pay

## 2011-12-16 ENCOUNTER — Ambulatory Visit (HOSPITAL_BASED_OUTPATIENT_CLINIC_OR_DEPARTMENT_OTHER): Payer: Medicare Other | Admitting: *Deleted

## 2011-12-16 DIAGNOSIS — I10 Essential (primary) hypertension: Secondary | ICD-10-CM | POA: Diagnosis not present

## 2011-12-16 DIAGNOSIS — M6530 Trigger finger, unspecified finger: Secondary | ICD-10-CM | POA: Insufficient documentation

## 2011-12-16 DIAGNOSIS — M6740 Ganglion, unspecified site: Secondary | ICD-10-CM | POA: Insufficient documentation

## 2011-12-16 HISTORY — DX: Gastro-esophageal reflux disease without esophagitis: K21.9

## 2011-12-16 HISTORY — DX: Reserved for concepts with insufficient information to code with codable children: IMO0002

## 2011-12-16 HISTORY — PX: TRIGGER FINGER RELEASE: SHX641

## 2011-12-16 HISTORY — DX: Dental restoration status: Z98.811

## 2011-12-16 HISTORY — DX: Trigger finger, unspecified finger: M65.30

## 2011-12-16 LAB — POCT HEMOGLOBIN-HEMACUE: Hemoglobin: 12.3 g/dL (ref 12.0–15.0)

## 2011-12-16 SURGERY — RELEASE, A1 PULLEY, FOR TRIGGER FINGER
Anesthesia: General | Site: Finger | Laterality: Right | Wound class: Clean

## 2011-12-16 MED ORDER — CEFAZOLIN SODIUM-DEXTROSE 2-3 GM-% IV SOLR
2.0000 g | Freq: Once | INTRAVENOUS | Status: AC
  Administered 2011-12-16: 2 g via INTRAVENOUS

## 2011-12-16 MED ORDER — EPHEDRINE SULFATE 50 MG/ML IJ SOLN
INTRAMUSCULAR | Status: DC | PRN
  Administered 2011-12-16: 10 mg via INTRAVENOUS

## 2011-12-16 MED ORDER — HYDROMORPHONE HCL PF 1 MG/ML IJ SOLN: 0.2500 mg | INTRAMUSCULAR | Status: DC | PRN

## 2011-12-16 MED ORDER — PROPOFOL 10 MG/ML IV BOLUS
INTRAVENOUS | Status: DC | PRN
  Administered 2011-12-16: 100 mg via INTRAVENOUS

## 2011-12-16 MED ORDER — BUPIVACAINE HCL (PF) 0.25 % IJ SOLN
INTRAMUSCULAR | Status: DC | PRN
  Administered 2011-12-16: 10 mL

## 2011-12-16 MED ORDER — OXYCODONE HCL 5 MG/5ML PO SOLN: 5.0000 mg | Freq: Once | ORAL | Status: DC | PRN

## 2011-12-16 MED ORDER — LACTATED RINGERS IV SOLN
INTRAVENOUS | Status: DC
  Administered 2011-12-16 (×2): via INTRAVENOUS

## 2011-12-16 MED ORDER — DEXAMETHASONE SODIUM PHOSPHATE 10 MG/ML IJ SOLN
INTRAMUSCULAR | Status: DC | PRN
  Administered 2011-12-16: 4 mg via INTRAVENOUS

## 2011-12-16 MED ORDER — ONDANSETRON HCL 4 MG/2ML IJ SOLN
INTRAMUSCULAR | Status: DC | PRN
  Administered 2011-12-16: 4 mg via INTRAVENOUS

## 2011-12-16 MED ORDER — FENTANYL CITRATE 0.05 MG/ML IJ SOLN
INTRAMUSCULAR | Status: DC | PRN
  Administered 2011-12-16: 50 ug via INTRAVENOUS
  Administered 2011-12-16: 25 ug via INTRAVENOUS

## 2011-12-16 MED ORDER — CHLORHEXIDINE GLUCONATE 4 % EX LIQD: 60.0000 mL | Freq: Once | CUTANEOUS | Status: DC

## 2011-12-16 MED ORDER — OXYCODONE HCL 5 MG PO TABS: 5.0000 mg | ORAL_TABLET | Freq: Once | ORAL | Status: DC | PRN

## 2011-12-16 MED ORDER — LIDOCAINE HCL (CARDIAC) 20 MG/ML IV SOLN
INTRAVENOUS | Status: DC | PRN
  Administered 2011-12-16: 40 mg via INTRAVENOUS

## 2011-12-16 MED ORDER — HYDROCODONE-ACETAMINOPHEN 5-325 MG PO TABS: ORAL_TABLET | ORAL | Status: DC

## 2011-12-16 SURGICAL SUPPLY — 38 items
BANDAGE COBAN STERILE 2 (GAUZE/BANDAGES/DRESSINGS) ×2 IMPLANT
BANDAGE CONFORM 2  STR LF (GAUZE/BANDAGES/DRESSINGS) ×2 IMPLANT
BLADE MINI RND TIP GREEN BEAV (BLADE) IMPLANT
BLADE SURG 15 STRL LF DISP TIS (BLADE) ×2 IMPLANT
BLADE SURG 15 STRL SS (BLADE) ×4
BNDG CMPR 9X4 STRL LF SNTH (GAUZE/BANDAGES/DRESSINGS) ×1
BNDG CMPR MD 5X2 ELC HKLP STRL (GAUZE/BANDAGES/DRESSINGS)
BNDG ELASTIC 2 VLCR STRL LF (GAUZE/BANDAGES/DRESSINGS) IMPLANT
BNDG ESMARK 4X9 LF (GAUZE/BANDAGES/DRESSINGS) ×1 IMPLANT
CHLORAPREP W/TINT 26ML (MISCELLANEOUS) ×2 IMPLANT
CLOTH BEACON ORANGE TIMEOUT ST (SAFETY) ×2 IMPLANT
CORDS BIPOLAR (ELECTRODE) ×2 IMPLANT
COVER MAYO STAND STRL (DRAPES) ×2 IMPLANT
COVER TABLE BACK 60X90 (DRAPES) ×2 IMPLANT
CUFF TOURNIQUET SINGLE 18IN (TOURNIQUET CUFF) ×2 IMPLANT
DRAPE EXTREMITY T 121X128X90 (DRAPE) ×2 IMPLANT
DRAPE SURG 17X23 STRL (DRAPES) ×2 IMPLANT
GAUZE XEROFORM 1X8 LF (GAUZE/BANDAGES/DRESSINGS) ×2 IMPLANT
GLOVE BIO SURGEON STRL SZ 6.5 (GLOVE) ×1 IMPLANT
GLOVE BIO SURGEON STRL SZ7.5 (GLOVE) ×2 IMPLANT
GLOVE BIOGEL PI IND STRL 7.0 (GLOVE) IMPLANT
GLOVE BIOGEL PI INDICATOR 7.0 (GLOVE) ×1
GOWN PREVENTION PLUS XLARGE (GOWN DISPOSABLE) ×2 IMPLANT
GOWN STRL REIN XL XLG (GOWN DISPOSABLE) ×2 IMPLANT
NDL HYPO 25X1 1.5 SAFETY (NEEDLE) IMPLANT
NEEDLE HYPO 25X1 1.5 SAFETY (NEEDLE) IMPLANT
NS IRRIG 1000ML POUR BTL (IV SOLUTION) ×2 IMPLANT
PACK BASIN DAY SURGERY FS (CUSTOM PROCEDURE TRAY) ×2 IMPLANT
PADDING CAST ABS 4INX4YD NS (CAST SUPPLIES) ×1
PADDING CAST ABS COTTON 4X4 ST (CAST SUPPLIES) ×1 IMPLANT
SPONGE GAUZE 4X4 12PLY (GAUZE/BANDAGES/DRESSINGS) ×2 IMPLANT
STOCKINETTE 4X48 STRL (DRAPES) ×2 IMPLANT
SUT ETHILON 4 0 PS 2 18 (SUTURE) ×2 IMPLANT
SYR BULB 3OZ (MISCELLANEOUS) ×2 IMPLANT
SYR CONTROL 10ML LL (SYRINGE) ×1 IMPLANT
TOWEL OR 17X24 6PK STRL BLUE (TOWEL DISPOSABLE) ×4 IMPLANT
UNDERPAD 30X30 INCONTINENT (UNDERPADS AND DIAPERS) ×2 IMPLANT
WATER STERILE IRR 1000ML POUR (IV SOLUTION) ×2 IMPLANT

## 2011-12-16 NOTE — Anesthesia Postprocedure Evaluation (Signed)
  Anesthesia Post-op Note  Patient: April Church  Procedure(s) Performed: Procedure(s) (LRB) with comments: RELEASE TRIGGER FINGER/A-1 PULLEY (Right) - RIGHT LONG FINGER TRIGGER RELEASE & ANNULAR CYST EXCISION  Patient Location: PACU  Anesthesia Type: General  Level of Consciousness: awake, alert  and oriented  Airway and Oxygen Therapy: Patient Spontanous Breathing  Post-op Pain: none  Post-op Assessment: Post-op Vital signs reviewed, Patient's Cardiovascular Status Stable, Respiratory Function Stable, Patent Airway and No signs of Nausea or vomiting  Post-op Vital Signs: Reviewed and stable  Complications: No apparent anesthesia complications

## 2011-12-16 NOTE — Interval H&P Note (Signed)
History and Physical Interval Note:  12/16/2011 9:25 AM  April Church  has presented today for surgery, with the diagnosis of RIGHT LONG FINGER TRIGGER/727.03, DIGIT & ANNULAR LIGAMENT CYST/727.43  The various methods of treatment have been discussed with the patient and family. After consideration of risks, benefits and other options for treatment, the patient has consented to  Procedure(s) (LRB) with comments: RELEASE TRIGGER FINGER/A-1 PULLEY (Right) - RIGHT LONG FINGER TRIGGER RELEASE & ANNULAR CYST EXCISION as a surgical intervention .  The patient's history has been reviewed, patient examined, no change in status, stable for surgery.  I have reviewed the patient's chart and labs.  Questions were answered to the patient's satisfaction.     Sandi Towe R

## 2011-12-16 NOTE — Transfer of Care (Signed)
Immediate Anesthesia Transfer of Care Note  Patient: April Church  Procedure(s) Performed: Procedure(s) (LRB) with comments: RELEASE TRIGGER FINGER/A-1 PULLEY (Right) - RIGHT LONG FINGER TRIGGER RELEASE & ANNULAR CYST EXCISION  Patient Location: PACU  Anesthesia Type: General  Level of Consciousness: awake, alert  and oriented  Airway & Oxygen Therapy: Patient Spontanous Breathing and Patient connected to face mask oxygen  Post-op Assessment: Report given to PACU RN, Post -op Vital signs reviewed and stable and Patient moving all extremities  Post vital signs: Reviewed and stable  Complications: No apparent anesthesia complications

## 2011-12-16 NOTE — Op Note (Signed)
Dictation [id number]

## 2011-12-16 NOTE — Anesthesia Procedure Notes (Signed)
Procedure Name: LMA Insertion Date/Time: 12/16/2011 9:42 AM Performed by: Meyer Russel Pre-anesthesia Checklist: Patient identified, Emergency Drugs available, Suction available and Patient being monitored Patient Re-evaluated:Patient Re-evaluated prior to inductionOxygen Delivery Method: Circle System Utilized Preoxygenation: Pre-oxygenation with 100% oxygen Intubation Type: IV induction Ventilation: Mask ventilation without difficulty LMA: LMA inserted LMA Size: 3.0 Number of attempts: 1 Airway Equipment and Method: bite block Placement Confirmation: positive ETCO2 and breath sounds checked- equal and bilateral Tube secured with: Tape Dental Injury: Teeth and Oropharynx as per pre-operative assessment

## 2011-12-16 NOTE — Anesthesia Preprocedure Evaluation (Addendum)
Anesthesia Evaluation  Patient identified by MRN, date of birth, ID band Patient awake    Reviewed: Allergy & Precautions, H&P , NPO status , Patient's Chart, lab work & pertinent test results  Airway Mallampati: II TM Distance: >3 FB Neck ROM: Full    Dental No notable dental hx. (+) Teeth Intact and Dental Advisory Given   Pulmonary asthma ,  breath sounds clear to auscultation  Pulmonary exam normal       Cardiovascular hypertension, On Medications Rhythm:Regular Rate:Normal     Neuro/Psych  Neuromuscular disease negative psych ROS   GI/Hepatic Neg liver ROS, GERD-  Medicated and Controlled,  Endo/Other  negative endocrine ROS  Renal/GU negative Renal ROS  negative genitourinary   Musculoskeletal   Abdominal   Peds  Hematology negative hematology ROS (+)   Anesthesia Other Findings   Reproductive/Obstetrics negative OB ROS                           Anesthesia Physical Anesthesia Plan  ASA: II  Anesthesia Plan: General   Post-op Pain Management:    Induction: Intravenous  Airway Management Planned: LMA  Additional Equipment:   Intra-op Plan:   Post-operative Plan: Extubation in OR  Informed Consent: I have reviewed the patients History and Physical, chart, labs and discussed the procedure including the risks, benefits and alternatives for the proposed anesthesia with the patient or authorized representative who has indicated his/her understanding and acceptance.   Dental advisory given  Plan Discussed with: CRNA  Anesthesia Plan Comments:         Anesthesia Quick Evaluation

## 2011-12-17 ENCOUNTER — Encounter (HOSPITAL_BASED_OUTPATIENT_CLINIC_OR_DEPARTMENT_OTHER): Payer: Self-pay | Admitting: Orthopedic Surgery

## 2011-12-17 NOTE — Op Note (Signed)
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  PROCEDURE:  Right long finger trigger digit release and annular ligament cyst excision.  SURGEON:  Betha Loa, MD  ASSISTANT:  None.  ANESTHESIA:  General.  IV FLUIDS:  Per anesthesia flow sheet.  ESTIMATED BLOOD LOSS:  Minimal.  COMPLICATIONS:  None.  SPECIMENS:  Annular ligament cyst to Pathology.  DISPOSITION:  Stable to PACU.  TOURNIQUET TIME:  25 minutes.  INDICATIONS:  Ms. Laitinen is a 71 year old female who has had triggering of the right long finger for approximately 1 year.  She has had injected twice and continues to recur.  She has also noticed a cyst on the volar aspect of the finger at the proximal finger flexion crease.  She wishes to have the trigger digit released and the cyst excised.  Risks, benefits, and alternatives of surgery were discussed including risk of blood loss, infection, damage to nerves, vessels, tendons, ligaments, bone; failure of surgery; need for additional surgery, complications with wound healing, continued pain, continued triggering, and recurrence of the cyst.  She voiced understanding of these risks and elected to proceed.  OPERATIVE COURSE:  After being identified preoperatively by myself, the patient and I agreed upon procedure and site of procedure.  Surgical site was marked.  The risks, benefits, and alternatives of surgery were reviewed and she wished to proceed.  Surgical consent had been signed. She was given 2 g of IV Ancef as preoperative  prophylaxis.  She was transported to the operating room, placed on the operative table in a supine position with the right upper extremity on arm board.  General anesthesia was induced by the anesthesiologist.  Right upper extremity was prepped and draped in normal sterile orthopedic fashion.  Surgical pause was performed between surgeons, anesthesia, and operating staff, and all were in agreement as to the patient, procedure, and site of procedure.  Tourniquet at the proximal aspect of the extremity was inflated to 250 mmHg.  After exsanguination of the limb with an Esmarch bandage, a Brunner-type incision was made at the level of the MP joint. This was carried into subcutaneous tissues by spreading technique.  The radial and ulnar digital neurovascular bundles were identified and were protected throughout the case.  The annular ligament cyst was easily identified.  The A1 pulley was identified and incised sharply.  The finger was placed through a range of motion, no longer triggered.  It did trigger prior to releasing of the pulley with passive motion of the finger.  There was an A0 pulley, which was released as well.  The cyst was excised sharply from the A2 pulley with a  small window.  There was an additional cyst on the radial side, which was also removed.  The skin was placed back over top of the pulley and palpated and no mass was able to be felt anymore.  The wound was copiously irrigated with sterile saline.  It was closed with 4-0 nylon in a horizontal mattress fashion. It was then injected with 10 mL of 0.25% plain Marcaine to aid in postoperative analgesia.  The wound was dressed with sterile Xeroform, 4x4s, and wrapped with Kling and a Coban dressing lightly.  The tourniquet was deflated 25 minutes.  Fingertips were pink with brisk capillary refill after deflation of tourniquet.  Operative drapes were broken down.  The patient was awoken from anesthesia safely.  She  was transferred back to stretcher and taken to PACU in stable condition.  I will see her back in the office in 1 week for postop followup.  I will give her Norco 5/325, 1-2 p.o. q.6 hours p.r.n. pain, dispensed #40. She has had Vicodin in the past without problems.     Betha Loa, MD     KK/MEDQ  D:  12/16/2011  T:  12/17/2011  Job:  [id number]

## 2011-12-21 ENCOUNTER — Other Ambulatory Visit (HOSPITAL_BASED_OUTPATIENT_CLINIC_OR_DEPARTMENT_OTHER): Payer: Medicare Other | Admitting: Lab

## 2011-12-21 ENCOUNTER — Ambulatory Visit (HOSPITAL_BASED_OUTPATIENT_CLINIC_OR_DEPARTMENT_OTHER): Payer: Medicare Other | Admitting: Oncology

## 2011-12-21 ENCOUNTER — Telehealth: Payer: Self-pay | Admitting: Oncology

## 2011-12-21 VITALS — BP 146/92 | HR 81 | Temp 97.4°F | Resp 20 | Ht 61.5 in | Wt 128.0 lb

## 2011-12-21 DIAGNOSIS — C50919 Malignant neoplasm of unspecified site of unspecified female breast: Secondary | ICD-10-CM

## 2011-12-21 DIAGNOSIS — M79609 Pain in unspecified limb: Secondary | ICD-10-CM

## 2011-12-21 DIAGNOSIS — M899 Disorder of bone, unspecified: Secondary | ICD-10-CM

## 2011-12-21 DIAGNOSIS — M949 Disorder of cartilage, unspecified: Secondary | ICD-10-CM

## 2011-12-21 DIAGNOSIS — C50119 Malignant neoplasm of central portion of unspecified female breast: Secondary | ICD-10-CM | POA: Diagnosis not present

## 2011-12-21 DIAGNOSIS — Z170 Estrogen receptor positive status [ER+]: Secondary | ICD-10-CM

## 2011-12-21 LAB — COMPREHENSIVE METABOLIC PANEL (CC13)
ALT: 11 U/L (ref 0–55)
AST: 14 U/L (ref 5–34)
Albumin: 4 g/dL (ref 3.5–5.0)
Alkaline Phosphatase: 75 U/L (ref 40–150)
BUN: 24 mg/dL (ref 7.0–26.0)
CO2: 27 mEq/L (ref 22–29)
Calcium: 9.4 mg/dL (ref 8.4–10.4)
Chloride: 100 mEq/L (ref 98–107)
Creatinine: 0.9 mg/dL (ref 0.6–1.1)
Glucose: 133 mg/dl — ABNORMAL HIGH (ref 70–99)
Potassium: 4.4 mEq/L (ref 3.5–5.1)
Sodium: 134 mEq/L — ABNORMAL LOW (ref 136–145)
Total Bilirubin: 0.26 mg/dL (ref 0.20–1.20)
Total Protein: 7.3 g/dL (ref 6.4–8.3)

## 2011-12-21 LAB — CBC WITH DIFFERENTIAL/PLATELET
BASO%: 1 % (ref 0.0–2.0)
Basophils Absolute: 0.1 10*3/uL (ref 0.0–0.1)
EOS%: 13.4 % — ABNORMAL HIGH (ref 0.0–7.0)
Eosinophils Absolute: 0.7 10*3/uL — ABNORMAL HIGH (ref 0.0–0.5)
HCT: 36.5 % (ref 34.8–46.6)
HGB: 12.7 g/dL (ref 11.6–15.9)
LYMPH%: 31.3 % (ref 14.0–49.7)
MCH: 31.9 pg (ref 25.1–34.0)
MCHC: 34.7 g/dL (ref 31.5–36.0)
MCV: 91.8 fL (ref 79.5–101.0)
MONO#: 0.5 10*3/uL (ref 0.1–0.9)
MONO%: 9.2 % (ref 0.0–14.0)
NEUT#: 2.3 10*3/uL (ref 1.5–6.5)
NEUT%: 45.1 % (ref 38.4–76.8)
Platelets: 266 10*3/uL (ref 145–400)
RBC: 3.98 10*6/uL (ref 3.70–5.45)
RDW: 12.2 % (ref 11.2–14.5)
WBC: 5.1 10*3/uL (ref 3.9–10.3)
lymph#: 1.6 10*3/uL (ref 0.9–3.3)

## 2011-12-21 LAB — URIC ACID (CC13): Uric Acid, Serum: 4.9 mg/dl (ref 2.6–7.4)

## 2011-12-21 NOTE — Patient Instructions (Signed)
Ask about gabapentin for leg pain

## 2011-12-21 NOTE — Progress Notes (Signed)
Hematology and Oncology Follow Up Visit  April Church [id number] October 01, 1940 71 y.o. 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  Interim History:  There have been no intercurrent illness, hospitalizations or medication changes. She thinks she has more joint pain on tamoxifen. Essentially show she has his leg pain mostly at nighttime. She takes medication I personally. She recently had hand surgery for trigger finger and took that medication as well at 89 which is also kept her sleep and fairly free of pain. Medications: I have reviewed the patient's current medications.  Allergies:  Allergies  Allergen Reactions  . Shellfish Allergy Shortness Of Breath  . Sulfa Drugs Cross Reactors Anxiety and Rash  . Codeine Anxiety    Past Medical History, Surgical history, Social history, and Family History were reviewed and updated.  Review of Systems: Constitutional:  Negative for fever, chills, night sweats, anorexia, weight loss, pain. Cardiovascular: no chest pain or dyspnea on exertion Respiratory: no cough, shortness of breath, or wheezing Neurological: no TIA or stroke symptoms Dermatological: negative ENT: negative Skin Gastrointestinal: no abdominal pain, change in bowel habits, or black or bloody stools Genito-Urinary: no dysuria, trouble voiding, or hematuria Hematological and Lymphatic: negative Breast: negative Musculoskeletal: negative Remaining ROS negative.  Physical Exam: Blood pressure 146/92, pulse 81, temperature 97.4 F (36.3 C), resp. rate 20, height 5' 1.5" (1.562 m), weight 128 lb (58.06 kg). ECOG: 0 General appearance: alert, cooperative and appears stated age Head: Normocephalic, without obvious abnormality, atraumatic Neck: no adenopathy, no carotid bruit, no JVD, supple, symmetrical, trachea midline and thyroid not enlarged, symmetric, no  tenderness/mass/nodules Lymph nodes: Cervical, supraclavicular, and axillary nodes normal. Cardiac : regular rate and rhythm, no murmurs or gallops Pulmonary:clear to auscultation bilaterally and normal percussion bilaterally Breasts: inspection negative, no nipple discharge or bleeding, no masses or nodularity palpable, no obvious evidence of local recurrence.both axillae are normal. Abdomen:soft, non-tender; bowel sounds normal; no masses,  no organomegaly Extremities negative Neuro: alert, oriented, normal speech, no focal findings or movement disorder noted  Lab Results: Lab Results  Component Value Date   WBC 5.1 12/21/2011   HGB 12.7 12/21/2011   HCT 36.5 12/21/2011   MCV 91.8 12/21/2011   PLT 266 12/21/2011     Chemistry      Component Value Date/Time   NA 134* 12/21/2011 1302   NA 136 12/15/2011 1000   K 4.4 12/21/2011 1302   K 4.4 12/15/2011 1000   CL 100 12/21/2011 1302   CL 98 12/15/2011 1000   CO2 27 12/21/2011 1302   CO2 29 12/15/2011 1000   BUN 24.0 12/21/2011 1302   BUN 15 12/15/2011 1000   CREATININE 0.9 12/21/2011 1302   CREATININE 0.81 12/15/2011 1000      Component Value Date/Time   CALCIUM 9.4 12/21/2011 1302   CALCIUM 9.7 12/15/2011 1000   ALKPHOS 75 12/21/2011 1302   ALKPHOS 44 05/21/2011 1423   AST 14 12/21/2011 1302   AST 18 05/21/2011 1423   ALT 11 12/21/2011 1302   ALT 10 05/21/2011 1423   BILITOT 0.26 12/21/2011 1302   BILITOT 0.2* 05/21/2011 1423      .pathology. Radiological Studies: chest X-ray n/a Mammogram June 2014 Bone density Awaiting result  Impression and Plan: 71 yo woman with hx of breast cancer on  On tamoxifen, are well from a breast cancer point of view. I recommended she try gabapentin  for her leg pain. Because she is one kidney she will discuss this with her nephrologist. I plan to see her back in followup in 6 months. More than 50% of the visit was spent in patient-related counselling   Pierce Crane,  MD 10/29/20132:42 PM

## 2011-12-21 NOTE — Telephone Encounter (Signed)
gve the pt her may 2014 appt calendar 

## 2011-12-22 ENCOUNTER — Encounter (HOSPITAL_BASED_OUTPATIENT_CLINIC_OR_DEPARTMENT_OTHER): Payer: Self-pay

## 2011-12-22 LAB — VITAMIN D 25 HYDROXY (VIT D DEFICIENCY, FRACTURES): Vit D, 25-Hydroxy: 68 ng/mL (ref 30–89)

## 2011-12-23 NOTE — Addendum Note (Signed)
Addendum  created 12/23/11 1225 by Ronnette Hila, CRNA   Modules edited:Anesthesia Events

## 2011-12-27 ENCOUNTER — Other Ambulatory Visit: Payer: Self-pay | Admitting: Oncology

## 2011-12-27 DIAGNOSIS — C50919 Malignant neoplasm of unspecified site of unspecified female breast: Secondary | ICD-10-CM

## 2011-12-30 DIAGNOSIS — B351 Tinea unguium: Secondary | ICD-10-CM | POA: Diagnosis not present

## 2011-12-30 DIAGNOSIS — L821 Other seborrheic keratosis: Secondary | ICD-10-CM | POA: Diagnosis not present

## 2012-01-03 ENCOUNTER — Other Ambulatory Visit: Payer: Self-pay | Admitting: *Deleted

## 2012-01-03 DIAGNOSIS — C50919 Malignant neoplasm of unspecified site of unspecified female breast: Secondary | ICD-10-CM

## 2012-01-03 MED ORDER — GABAPENTIN 100 MG PO CAPS: ORAL_CAPSULE | ORAL | Status: DC

## 2012-01-04 DIAGNOSIS — G250 Essential tremor: Secondary | ICD-10-CM | POA: Diagnosis not present

## 2012-01-04 DIAGNOSIS — R490 Dysphonia: Secondary | ICD-10-CM | POA: Diagnosis not present

## 2012-01-04 DIAGNOSIS — G252 Other specified forms of tremor: Secondary | ICD-10-CM | POA: Diagnosis not present

## 2012-01-05 DIAGNOSIS — H43819 Vitreous degeneration, unspecified eye: Secondary | ICD-10-CM | POA: Diagnosis not present

## 2012-01-05 DIAGNOSIS — Z961 Presence of intraocular lens: Secondary | ICD-10-CM | POA: Diagnosis not present

## 2012-01-05 DIAGNOSIS — H43399 Other vitreous opacities, unspecified eye: Secondary | ICD-10-CM | POA: Diagnosis not present

## 2012-01-05 DIAGNOSIS — H04129 Dry eye syndrome of unspecified lacrimal gland: Secondary | ICD-10-CM | POA: Diagnosis not present

## 2012-01-26 DIAGNOSIS — T782XXA Anaphylactic shock, unspecified, initial encounter: Secondary | ICD-10-CM | POA: Diagnosis not present

## 2012-01-26 DIAGNOSIS — J45909 Unspecified asthma, uncomplicated: Secondary | ICD-10-CM | POA: Diagnosis not present

## 2012-01-26 DIAGNOSIS — J309 Allergic rhinitis, unspecified: Secondary | ICD-10-CM | POA: Diagnosis not present

## 2012-02-23 ENCOUNTER — Other Ambulatory Visit: Payer: Self-pay | Admitting: Oncology

## 2012-03-02 DIAGNOSIS — L821 Other seborrheic keratosis: Secondary | ICD-10-CM | POA: Diagnosis not present

## 2012-03-02 DIAGNOSIS — L719 Rosacea, unspecified: Secondary | ICD-10-CM | POA: Diagnosis not present

## 2012-03-02 DIAGNOSIS — D2370 Other benign neoplasm of skin of unspecified lower limb, including hip: Secondary | ICD-10-CM | POA: Diagnosis not present

## 2012-03-06 DIAGNOSIS — M6740 Ganglion, unspecified site: Secondary | ICD-10-CM | POA: Diagnosis not present

## 2012-03-14 DIAGNOSIS — Z853 Personal history of malignant neoplasm of breast: Secondary | ICD-10-CM | POA: Diagnosis not present

## 2012-04-20 DIAGNOSIS — Z1331 Encounter for screening for depression: Secondary | ICD-10-CM | POA: Diagnosis not present

## 2012-04-20 DIAGNOSIS — IMO0002 Reserved for concepts with insufficient information to code with codable children: Secondary | ICD-10-CM | POA: Diagnosis not present

## 2012-04-20 DIAGNOSIS — Z0000 Encounter for general adult medical examination without abnormal findings: Secondary | ICD-10-CM | POA: Diagnosis not present

## 2012-04-20 DIAGNOSIS — I10 Essential (primary) hypertension: Secondary | ICD-10-CM | POA: Diagnosis not present

## 2012-04-21 ENCOUNTER — Encounter (INDEPENDENT_AMBULATORY_CARE_PROVIDER_SITE_OTHER): Payer: Self-pay | Admitting: General Surgery

## 2012-04-21 ENCOUNTER — Ambulatory Visit (INDEPENDENT_AMBULATORY_CARE_PROVIDER_SITE_OTHER): Payer: Medicare Other | Admitting: General Surgery

## 2012-04-21 VITALS — BP 126/78 | HR 88 | Resp 18 | Ht 61.5 in | Wt 128.0 lb

## 2012-04-21 DIAGNOSIS — C50919 Malignant neoplasm of unspecified site of unspecified female breast: Secondary | ICD-10-CM

## 2012-04-21 DIAGNOSIS — C50912 Malignant neoplasm of unspecified site of left female breast: Secondary | ICD-10-CM

## 2012-04-21 NOTE — Progress Notes (Signed)
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 She is on adjuvant Arimidex. She reports she is doing well other than some nighttime leg aching related to the antiestrogen. She is just increased her gabapentin for this. She denies any changes in her breast or other concerns.  Exam: BP 126/78  Pulse 88  Resp 18  Ht 5' 1.5" (1.562 m)  Wt 128 lb (58.06 kg)  BMI 23.8 kg/m2 General: Appears well Lymph nodes: No cervical, supraclavicular, or axillary nodes palpable Lungs: Clear equal breath sounds bilaterally. Breast: Status post bilateral reduction. No palpable masses in either breast. No skin changes or nipple retraction.  Imaging: Mammogram at [facility] in January was negative  Assessment and plan: Doing well at 2-1/2 years post diagnosis with no evidence of recurrence or new breast cancer. 2 return in 6 months.

## 2012-05-02 ENCOUNTER — Telehealth: Payer: Self-pay | Admitting: *Deleted

## 2012-05-02 NOTE — Telephone Encounter (Signed)
Left message for a return phone call to reschedule her appt.  Awaiting patient response I have cancelled her appts.

## 2012-05-04 ENCOUNTER — Telehealth: Payer: Self-pay | Admitting: *Deleted

## 2012-05-04 NOTE — Telephone Encounter (Signed)
Received a return phone call from patient to reschedule her appt.  Confirmed appt. For 06/30/12 at 11am with Bernell List.  Then will become Dr. Welton Flakes.

## 2012-05-24 ENCOUNTER — Other Ambulatory Visit: Payer: Self-pay | Admitting: Oncology

## 2012-05-25 ENCOUNTER — Other Ambulatory Visit: Payer: Self-pay | Admitting: *Deleted

## 2012-05-25 DIAGNOSIS — C50919 Malignant neoplasm of unspecified site of unspecified female breast: Secondary | ICD-10-CM

## 2012-05-25 MED ORDER — VENLAFAXINE HCL 37.5 MG PO TABS: ORAL_TABLET | ORAL | Status: DC

## 2012-05-29 DIAGNOSIS — Z961 Presence of intraocular lens: Secondary | ICD-10-CM | POA: Diagnosis not present

## 2012-05-29 DIAGNOSIS — H43819 Vitreous degeneration, unspecified eye: Secondary | ICD-10-CM | POA: Diagnosis not present

## 2012-05-29 DIAGNOSIS — H04129 Dry eye syndrome of unspecified lacrimal gland: Secondary | ICD-10-CM | POA: Diagnosis not present

## 2012-06-07 ENCOUNTER — Ambulatory Visit (INDEPENDENT_AMBULATORY_CARE_PROVIDER_SITE_OTHER): Payer: Medicare Other | Admitting: Diagnostic Neuroimaging

## 2012-06-07 ENCOUNTER — Encounter: Payer: Self-pay | Admitting: Diagnostic Neuroimaging

## 2012-06-07 VITALS — BP 158/103 | HR 74 | Temp 97.9°F | Ht 61.5 in | Wt 131.0 lb

## 2012-06-07 DIAGNOSIS — G250 Essential tremor: Secondary | ICD-10-CM

## 2012-06-07 DIAGNOSIS — G252 Other specified forms of tremor: Secondary | ICD-10-CM | POA: Diagnosis not present

## 2012-06-07 NOTE — Patient Instructions (Signed)
Continue primidone. May try increasing to 6 tablets (2 tablets 3 times per day) for better tremor control.

## 2012-06-07 NOTE — Progress Notes (Signed)
[facility]  PATIENT: April Church DOB: [birth year]  REFERRING CLINICIAN:  HISTORY FROM: patient REASON FOR VISIT: follow up   HISTORICAL  CHIEF COMPLAINT:  Chief Complaint  Patient presents with  . Tremors    HISTORY OF PRESENT ILLNESS:   UPDATE 06/07/12: Doing well. BUE tremor is stable and controlled. Voice tremor is slightly progressing. Tolerating primidone.  PRIOR HPI (06/09/11):  72 yrs old female returns for F/U. Last seen 06/09/10.  She has been followed in this office for many years for essential tremor. She takes primidone 50 mg, 1.5 TID. She tolerates it well. She comes in today saying that her tremor is a little worse before lunch.  The tremor limits her a little bit with eating and writing but for the most part she is happy with her level of function.She has hx of  breast cancer.  No neurologic complaints.   REVIEW OF SYSTEMS: Full 14 system review of systems performed and notable only for tremor.  ALLERGIES: Allergies  Allergen Reactions  . Shellfish Allergy Shortness Of Breath  . Sulfa Drugs Cross Reactors Anxiety and Rash  . Codeine Anxiety    HOME MEDICATIONS: Outpatient Prescriptions Prior to Visit  Medication Sig Dispense Refill  . anastrozole (ARIMIDEX) 1 MG tablet TAKE ONE TABLET BY MOUTH EVERY DAY  30 tablet  5  . aspirin 81 MG tablet Take 81 mg by mouth daily.        . beclomethasone (QVAR) 80 MCG/ACT inhaler Inhale 1 puff into the lungs 2 (two) times daily.       . Calcium Carbonate (CALTRATE 600 PO) Take by mouth 2 (two) times daily.       . Cholecalciferol (VITAMIN D) 1000 UNITS capsule Take 1,000 Units by mouth 2 (two) times daily.       . fluticasone (FLONASE) 50 MCG/ACT nasal spray Place 2 sprays into the nose daily.        [name] gabapentin (NEURONTIN) 100 MG capsule 1  Po at night, may titrate up to 1 po 3 times daily  90 capsule  3  . losartan (COZAAR) 50 MG tablet Take 50 mg by mouth daily.      . Omega-3 Fatty Acids  (FISH OIL) 1200 MG CAPS Take by mouth.        . primidone (MYSOLINE) 50 MG tablet Take 50 mg by mouth 5 (five) times daily. 2 in morning, 2 at noon, 1 at dinner      . ranitidine (ZANTAC) 150 MG capsule Take 150 mg by mouth 2 (two) times daily.        [name] venlafaxine (EFFEXOR) 37.5 MG tablet TAKE ONE TABLET BY MOUTH EVERY DAY  90 tablet  0   No facility-administered medications prior to visit.    PAST MEDICAL HISTORY: Past Medical History  Diagnosis Date  . Tremors of nervous system     hands  . GERD (gastroesophageal reflux disease)   . 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 Cyst of finger 11/2011    annular cyst right long finger  . Hypertension     under control, has been on med. x 2 yrs.  . Dental crowns present   . Asthma     daily inhaler  . Hyperlipidemia     PAST SURGICAL HISTORY: Past Surgical History  Procedure Laterality Date  . Nasal sinus surgery      x 2  . Foot surgery  06/22/11  left  . Knee arthroscopy  03/12/2005    right  . Nephrectomy living donor  04/2008  . Appendectomy  age 8  . Tumor excision  age 13    right arm  . Cataract extraction, bilateral    . Breast surgery  1999    reduction  . Breast lumpectomy  06/16/2009    left; SLN bx.  . Breast lumpectomy  07/01/2009    re-excision  . Knee arthroscopy      left  . Trigger finger release  12/16/2011    Procedure: RELEASE TRIGGER FINGER/A-1 PULLEY;  Surgeon: Tami Ribas, MD;  Location: [facility];  Service: Orthopedics;  Laterality: Right;  RIGHT LONG FINGER TRIGGER RELEASE & ANNULAR CYST EXCISION    FAMILY HISTORY: Family History  Problem Relation Age of Onset  . Kidney disease Mother   . Stroke Father   . Diabetes Brother   . Heart disease Brother   . Cancer Sister     SOCIAL HISTORY:  History   Social History  . Marital Status: Married    Spouse Name: N/A    Number of Children: N/A  . Years of Education: N/A   Occupational History  . Not on  file.   Social History Main Topics  . Smoking status: Never Smoker   . Smokeless tobacco: Never Used  . Alcohol Use: No  . Drug Use: No  . Sexually Active: Not on file   Other Topics Concern  . Not on file   Social History Narrative      Pt lives at home with her spouse.            PHYSICAL EXAM  Filed Vitals:   06/07/12 1543  BP: 158/103  Pulse: 74  Temp: 97.9 F (36.6 C)  TempSrc: Oral  Height: 5' 1.5" (1.562 m)  Weight: 131 lb (59.421 kg)   Body mass index is 24.35 kg/(m^2).  GENERAL EXAM: Patient is in no distress  CARDIOVASCULAR: Regular rate and rhythm, no murmurs, no carotid bruits  NEUROLOGIC: MENTAL STATUS: awake, alert, language fluent, comprehension intact, naming intact CRANIAL NERVE: no papilledema on fundoscopic exam, pupils equal and reactive to light, visual fields full to confrontation, extraocular muscles intact, no nystagmus, facial sensation and strength symmetric, uvula midline, shoulder shrug symmetric, tongue midline; VOICE TREMOR. MOTOR: normal bulk and tone, full strength in the BUE, BLE; MILD POSTURAL TREMOR IN BUE. SENSORY: normal and symmetric to light touch COORDINATION: finger-nose-finger, fine finger movements normal REFLEXES: deep tendon reflexes present and symmetric GAIT/STATION: narrow based gait; able to tandem; romberg is negative   DIAGNOSTIC DATA (LABS, IMAGING, TESTING) - I reviewed patient records, labs, notes, testing and imaging myself where available.  Lab Results  Component Value Date   WBC 5.1 12/21/2011   HGB 12.7 12/21/2011   HCT 36.5 12/21/2011   MCV 91.8 12/21/2011   PLT 266 12/21/2011      Component Value Date/Time   NA 134* 12/21/2011 1302   NA 136 12/15/2011 1000   K 4.4 12/21/2011 1302   K 4.4 12/15/2011 1000   CL 100 12/21/2011 1302   CL 98 12/15/2011 1000   CO2 27 12/21/2011 1302   CO2 29 12/15/2011 1000   GLUCOSE 133* 12/21/2011 1302   GLUCOSE 98 12/15/2011 1000   BUN 24.0 12/21/2011 1302    BUN 15 12/15/2011 1000   CREATININE 0.9 12/21/2011 1302   CREATININE 0.81 12/15/2011 1000   CALCIUM 9.4 12/21/2011 1302   CALCIUM 9.7  12/15/2011 1000   PROT 7.3 12/21/2011 1302   PROT 6.7 05/21/2011 1423   ALBUMIN 4.0 12/21/2011 1302   ALBUMIN 4.0 05/21/2011 1423   AST 14 12/21/2011 1302   AST 18 05/21/2011 1423   ALT 11 12/21/2011 1302   ALT 10 05/21/2011 1423   ALKPHOS 75 12/21/2011 1302   ALKPHOS 44 05/21/2011 1423   BILITOT 0.26 12/21/2011 1302   BILITOT 0.2* 05/21/2011 1423   GFRNONAA 71* 12/15/2011 1000   GFRAA 83* 12/15/2011 1000   No results found for this basename: CHOL, HDL, LDLCALC, LDLDIRECT, TRIG, CHOLHDL   No results found for this basename: HGBA1C   No results found for this basename: VITAMINB12   No results found for this basename: TSH    ASSESSMENT AND PLAN  72 y.o. year old female  has a past medical history of Tremors of nervous system; GERD (gastroesophageal reflux disease); Trigger finger of right hand (11/2011); Cyst of finger (11/2011); Hypertension; Dental crowns present; Asthma; and Hyperlipidemia. here with essential tremor.  PLAN: 1. Could try to increase primidone to 100mg  TID  Suanne Marker, MD 06/07/2012, 4:07 PM Certified in Neurology, Neurophysiology and Neuroimaging  [facility] [address] [place], [place] [address] [phone]

## 2012-06-09 ENCOUNTER — Other Ambulatory Visit: Payer: Self-pay | Admitting: Nurse Practitioner

## 2012-06-14 DIAGNOSIS — Z124 Encounter for screening for malignant neoplasm of cervix: Secondary | ICD-10-CM | POA: Diagnosis not present

## 2012-06-14 DIAGNOSIS — Z01419 Encounter for gynecological examination (general) (routine) without abnormal findings: Secondary | ICD-10-CM | POA: Diagnosis not present

## 2012-06-22 DIAGNOSIS — Z524 Kidney donor: Secondary | ICD-10-CM | POA: Diagnosis not present

## 2012-06-22 DIAGNOSIS — I10 Essential (primary) hypertension: Secondary | ICD-10-CM | POA: Diagnosis not present

## 2012-06-29 ENCOUNTER — Ambulatory Visit: Payer: Medicare Other | Admitting: Oncology

## 2012-06-29 ENCOUNTER — Other Ambulatory Visit: Payer: Medicare Other | Admitting: Lab

## 2012-06-30 ENCOUNTER — Ambulatory Visit (HOSPITAL_BASED_OUTPATIENT_CLINIC_OR_DEPARTMENT_OTHER): Payer: Medicare Other | Admitting: Lab

## 2012-06-30 ENCOUNTER — Ambulatory Visit (HOSPITAL_BASED_OUTPATIENT_CLINIC_OR_DEPARTMENT_OTHER): Payer: Medicare Other | Admitting: Oncology

## 2012-06-30 ENCOUNTER — Encounter: Payer: Self-pay | Admitting: Oncology

## 2012-06-30 ENCOUNTER — Telehealth: Payer: Self-pay | Admitting: *Deleted

## 2012-06-30 VITALS — BP 123/80 | HR 96 | Temp 98.3°F | Resp 20 | Ht 61.5 in | Wt 127.6 lb

## 2012-06-30 DIAGNOSIS — Z170 Estrogen receptor positive status [ER+]: Secondary | ICD-10-CM

## 2012-06-30 DIAGNOSIS — C50912 Malignant neoplasm of unspecified site of left female breast: Secondary | ICD-10-CM

## 2012-06-30 DIAGNOSIS — C50119 Malignant neoplasm of central portion of unspecified female breast: Secondary | ICD-10-CM | POA: Diagnosis not present

## 2012-06-30 DIAGNOSIS — M79609 Pain in unspecified limb: Secondary | ICD-10-CM

## 2012-06-30 LAB — COMPREHENSIVE METABOLIC PANEL (CC13)
ALT: 15 U/L (ref 0–55)
AST: 19 U/L (ref 5–34)
Albumin: 3.8 g/dL (ref 3.5–5.0)
Alkaline Phosphatase: 98 U/L (ref 40–150)
BUN: 17.6 mg/dL (ref 7.0–26.0)
CO2: 28 mEq/L (ref 22–29)
Calcium: 9 mg/dL (ref 8.4–10.4)
Chloride: 102 mEq/L (ref 98–107)
Creatinine: 0.8 mg/dL (ref 0.6–1.1)
Glucose: 77 mg/dl (ref 70–99)
Potassium: 4.5 mEq/L (ref 3.5–5.1)
Sodium: 138 mEq/L (ref 136–145)
Total Bilirubin: 0.26 mg/dL (ref 0.20–1.20)
Total Protein: 7.8 g/dL (ref 6.4–8.3)

## 2012-06-30 LAB — CBC WITH DIFFERENTIAL/PLATELET
BASO%: 1 % (ref 0.0–2.0)
Basophils Absolute: 0 10*3/uL (ref 0.0–0.1)
EOS%: 13.8 % — ABNORMAL HIGH (ref 0.0–7.0)
Eosinophils Absolute: 0.6 10*3/uL — ABNORMAL HIGH (ref 0.0–0.5)
HCT: 38.6 % (ref 34.8–46.6)
HGB: 13 g/dL (ref 11.6–15.9)
LYMPH%: 29.9 % (ref 14.0–49.7)
MCH: 30.5 pg (ref 25.1–34.0)
MCHC: 33.8 g/dL (ref 31.5–36.0)
MCV: 90.3 fL (ref 79.5–101.0)
MONO#: 0.4 10*3/uL (ref 0.1–0.9)
MONO%: 8.9 % (ref 0.0–14.0)
NEUT#: 2.2 10*3/uL (ref 1.5–6.5)
NEUT%: 46.4 % (ref 38.4–76.8)
Platelets: 276 10*3/uL (ref 145–400)
RBC: 4.27 10*6/uL (ref 3.70–5.45)
RDW: 13 % (ref 11.2–14.5)
WBC: 4.7 10*3/uL (ref 3.9–10.3)
lymph#: 1.4 10*3/uL (ref 0.9–3.3)

## 2012-06-30 NOTE — Telephone Encounter (Signed)
appts made and printed...td 

## 2012-06-30 NOTE — Patient Instructions (Addendum)
Doing well continue arimidex daily  Try glucosamine chondroitin for aches and pain  Tylenol as needed  I will see you back in 6 months

## 2012-07-03 NOTE — Progress Notes (Signed)
OFFICE PROGRESS NOTE  CC  [name], MD [address] [place], [address] [facility] [place], [place]. [place] [address]  DIAGNOSIS: 72 year old female with history of stage I invasive ductal carcinoma status post lumpectomy on 06/16/2009  PRIOR THERAPY:  #1 patient underwent a lumpectomy on 06/16/2009 for a T1 C. N0 ER/PR positive HER-2/neu negative breast cancer.  #2 she then started on radiation therapy which he completed on 09/03/2009.  #3 she was then started on letrozole 2.5 mg for 2 years then switch to tamoxifen 20 mg daily but patient could not tolerate tamoxifen and therefore she was begun on Arimidex in October 2013.  CURRENT THERAPY: arimidex 1 mg   INTERVAL HISTORY: April Church 72 y.o. female returns for followup visit. Overall she seems to be doing well she denies any fevers chills night sweats headaches shortness of breath chest pains palpitations no myalgias no vaginal bleeding or discharge. She has not noticed any swelling in her lower extremities.  MEDICAL HISTORY: Past Medical History  Diagnosis Date  . Tremors of nervous system     hands  . GERD (gastroesophageal reflux disease)   . 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 Cyst of finger 11/2011    annular cyst right long finger  . Hypertension     under control, has been on med. x 2 yrs.  . Dental crowns present   . Asthma     daily inhaler  . Hyperlipidemia     ALLERGIES:  is allergic to shellfish allergy; sulfa drugs cross reactors; and codeine.  MEDICATIONS:  Current Outpatient Prescriptions  Medication Sig Dispense Refill  . anastrozole (ARIMIDEX) 1 MG tablet TAKE ONE TABLET BY MOUTH EVERY DAY  30 tablet  5  . aspirin 81 MG tablet Take 81 mg by mouth daily.        . beclomethasone (QVAR) 80 MCG/ACT inhaler Inhale 1 puff into the lungs 2 (two) times daily.       . Calcium Carbonate (CALTRATE 600 PO) Take by mouth 2 (two) times daily.       .  Cholecalciferol (VITAMIN D) 1000 UNITS capsule Take 1,000 Units by mouth 2 (two) times daily.       . fluticasone (FLONASE) 50 MCG/ACT nasal spray Place 2 sprays into the nose daily.        [name] losartan (COZAAR) 50 MG tablet Take 50 mg by mouth daily.      . Omega-3 Fatty Acids (FISH OIL) 1200 MG CAPS Take by mouth.        . primidone (MYSOLINE) 50 MG tablet Take 2 tablets (100 mg total) by mouth 3 (three) times daily.  180 tablet  6  . ranitidine (ZANTAC) 150 MG capsule Take 150 mg by mouth daily after breakfast.       . simvastatin (ZOCOR) 20 MG tablet Take 20 mg by mouth every evening.      . venlafaxine (EFFEXOR) 37.5 MG tablet TAKE ONE TABLET BY MOUTH EVERY DAY  90 tablet  0   No current facility-administered medications for this visit.    SURGICAL HISTORY:  Past Surgical History  Procedure Laterality Date  . Nasal sinus surgery      x 2  . Foot surgery  06/22/11    left  . Knee arthroscopy  03/12/2005    right  . Nephrectomy living donor  04/2008  . Appendectomy  age 28  . Tumor excision  age 22  right arm  . Cataract extraction, bilateral    . Breast surgery  1999    reduction  . Breast lumpectomy  06/16/2009    left; SLN bx.  . Breast lumpectomy  07/01/2009    re-excision  . Knee arthroscopy      left  . Trigger finger release  12/16/2011    Procedure: RELEASE TRIGGER FINGER/A-1 PULLEY;  Surgeon: Tami Ribas, MD;  Location: [facility];  Service: Orthopedics;  Laterality: Right;  RIGHT LONG FINGER TRIGGER RELEASE & ANNULAR CYST EXCISION    REVIEW OF SYSTEMS:  Pertinent items are noted in HPI.   HEALTH MAINTENANCE:   PHYSICAL EXAMINATION: Blood pressure 123/80, pulse 96, temperature 98.3 F (36.8 C), temperature source Oral, resp. rate 20, height 5' 1.5" (1.562 m), weight 127 lb 9.6 oz (57.879 kg). Body mass index is 23.72 kg/(m^2). ECOG PERFORMANCE STATUS: 1 - Symptomatic but completely ambulatory  Patient is awake alert in no acute distress  HEENT  exam EOMI PERRLA, sclerae anicteric no conjunctival pallor oral mucosa is moist supple no palpable adenopathy Lungs clear Cardiovascular regular rate rhythm Abdomen soft nontender no HSM Extremities trace edema Left breast: Well-healed surgical scar no nodularity no thickening. Right breast no masses or nipple discharge.    LABORATORY DATA: Lab Results  Component Value Date   WBC 4.7 06/30/2012   HGB 13.0 06/30/2012   HCT 38.6 06/30/2012   MCV 90.3 06/30/2012   PLT 276 06/30/2012      Chemistry      Component Value Date/Time   NA 138 06/30/2012 1231   NA 136 12/15/2011 1000   K 4.5 06/30/2012 1231   K 4.4 12/15/2011 1000   CL 102 06/30/2012 1231   CL 98 12/15/2011 1000   CO2 28 06/30/2012 1231   CO2 29 12/15/2011 1000   BUN 17.6 06/30/2012 1231   BUN 15 12/15/2011 1000   CREATININE 0.8 06/30/2012 1231   CREATININE 0.81 12/15/2011 1000      Component Value Date/Time   CALCIUM 9.0 06/30/2012 1231   CALCIUM 9.7 12/15/2011 1000   ALKPHOS 98 06/30/2012 1231   ALKPHOS 44 05/21/2011 1423   AST 19 06/30/2012 1231   AST 18 05/21/2011 1423   ALT 15 06/30/2012 1231   ALT 10 05/21/2011 1423   BILITOT 0.26 06/30/2012 1231   BILITOT 0.2* 05/21/2011 1423       RADIOGRAPHIC STUDIES:  No results found.  ASSESSMENT: 72 year old female with  #1 history of T1 N0 invasive carcinoma of the left breast status post lumpectomy with negative sentinel node. Overall she is doing well. She is currently on Arimidex 1 mg daily.  #2 patient with nighttime leg pain she is on gabapentin but she discontinued it on her own and she feels it is not helping   PLAN:   #1 continue Arimidex 1 mg daily.  #2 we discussed trying glucosamine/chondroitin for aches and pains, she can take Tylenol as needed.  #3 I will see her back in 6 months time for followup.   All questions were answered. The patient knows to call the clinic with any problems, questions or concerns. We can certainly see the patient much sooner if  necessary.  I spent 25 minutes counseling the patient face to face. The total time spent in the appointment was 30 minutes.    Drue Second, MD Medical/Oncology [facility] [phone] (beeper) [phone] (Office)

## 2012-07-25 ENCOUNTER — Other Ambulatory Visit: Payer: Self-pay | Admitting: Oncology

## 2012-07-25 DIAGNOSIS — C50919 Malignant neoplasm of unspecified site of unspecified female breast: Secondary | ICD-10-CM

## 2012-08-08 DIAGNOSIS — J309 Allergic rhinitis, unspecified: Secondary | ICD-10-CM | POA: Diagnosis not present

## 2012-08-08 DIAGNOSIS — Z5189 Encounter for other specified aftercare: Secondary | ICD-10-CM | POA: Diagnosis not present

## 2012-08-08 DIAGNOSIS — J45909 Unspecified asthma, uncomplicated: Secondary | ICD-10-CM | POA: Diagnosis not present

## 2012-08-24 ENCOUNTER — Other Ambulatory Visit: Payer: Self-pay | Admitting: Oncology

## 2012-10-18 DIAGNOSIS — I10 Essential (primary) hypertension: Secondary | ICD-10-CM | POA: Diagnosis not present

## 2012-10-18 DIAGNOSIS — E7800 Pure hypercholesterolemia, unspecified: Secondary | ICD-10-CM | POA: Diagnosis not present

## 2012-10-18 DIAGNOSIS — M5412 Radiculopathy, cervical region: Secondary | ICD-10-CM | POA: Diagnosis not present

## 2012-11-09 DIAGNOSIS — J45909 Unspecified asthma, uncomplicated: Secondary | ICD-10-CM | POA: Diagnosis not present

## 2012-11-09 DIAGNOSIS — J309 Allergic rhinitis, unspecified: Secondary | ICD-10-CM | POA: Diagnosis not present

## 2012-11-09 DIAGNOSIS — Z5189 Encounter for other specified aftercare: Secondary | ICD-10-CM | POA: Diagnosis not present

## 2012-11-20 DIAGNOSIS — Z23 Encounter for immunization: Secondary | ICD-10-CM | POA: Diagnosis not present

## 2012-11-22 ENCOUNTER — Other Ambulatory Visit: Payer: Self-pay | Admitting: Oncology

## 2012-11-22 DIAGNOSIS — C50919 Malignant neoplasm of unspecified site of unspecified female breast: Secondary | ICD-10-CM

## 2012-12-19 ENCOUNTER — Telehealth: Payer: Self-pay | Admitting: Oncology

## 2012-12-25 DIAGNOSIS — R21 Rash and other nonspecific skin eruption: Secondary | ICD-10-CM | POA: Diagnosis not present

## 2012-12-25 DIAGNOSIS — L820 Inflamed seborrheic keratosis: Secondary | ICD-10-CM | POA: Diagnosis not present

## 2012-12-25 DIAGNOSIS — L259 Unspecified contact dermatitis, unspecified cause: Secondary | ICD-10-CM | POA: Diagnosis not present

## 2012-12-25 DIAGNOSIS — L821 Other seborrheic keratosis: Secondary | ICD-10-CM | POA: Diagnosis not present

## 2013-01-02 ENCOUNTER — Ambulatory Visit (HOSPITAL_BASED_OUTPATIENT_CLINIC_OR_DEPARTMENT_OTHER): Payer: Medicare Other | Admitting: Oncology

## 2013-01-02 ENCOUNTER — Encounter (INDEPENDENT_AMBULATORY_CARE_PROVIDER_SITE_OTHER): Payer: Self-pay

## 2013-01-02 ENCOUNTER — Telehealth: Payer: Self-pay | Admitting: Oncology

## 2013-01-02 ENCOUNTER — Other Ambulatory Visit (HOSPITAL_BASED_OUTPATIENT_CLINIC_OR_DEPARTMENT_OTHER): Payer: Medicare Other | Admitting: Lab

## 2013-01-02 ENCOUNTER — Encounter: Payer: Self-pay | Admitting: Oncology

## 2013-01-02 VITALS — BP 122/81 | HR 84 | Temp 97.8°F | Resp 20 | Ht 61.5 in | Wt 133.0 lb

## 2013-01-02 DIAGNOSIS — C50119 Malignant neoplasm of central portion of unspecified female breast: Secondary | ICD-10-CM

## 2013-01-02 DIAGNOSIS — R21 Rash and other nonspecific skin eruption: Secondary | ICD-10-CM

## 2013-01-02 DIAGNOSIS — Z170 Estrogen receptor positive status [ER+]: Secondary | ICD-10-CM | POA: Diagnosis not present

## 2013-01-02 DIAGNOSIS — C50912 Malignant neoplasm of unspecified site of left female breast: Secondary | ICD-10-CM

## 2013-01-02 LAB — CBC WITH DIFFERENTIAL/PLATELET
BASO%: 1.1 % (ref 0.0–2.0)
Basophils Absolute: 0 10*3/uL (ref 0.0–0.1)
EOS%: 3 % (ref 0.0–7.0)
Eosinophils Absolute: 0.1 10*3/uL (ref 0.0–0.5)
HCT: 34.4 % — ABNORMAL LOW (ref 34.8–46.6)
HGB: 11.5 g/dL — ABNORMAL LOW (ref 11.6–15.9)
LYMPH%: 34.5 % (ref 14.0–49.7)
MCH: 30.5 pg (ref 25.1–34.0)
MCHC: 33.5 g/dL (ref 31.5–36.0)
MCV: 91 fL (ref 79.5–101.0)
MONO#: 0.5 10*3/uL (ref 0.1–0.9)
MONO%: 13 % (ref 0.0–14.0)
NEUT#: 2 10*3/uL (ref 1.5–6.5)
NEUT%: 48.4 % (ref 38.4–76.8)
Platelets: 263 10*3/uL (ref 145–400)
RBC: 3.78 10*6/uL (ref 3.70–5.45)
RDW: 12.4 % (ref 11.2–14.5)
WBC: 4.1 10*3/uL (ref 3.9–10.3)
lymph#: 1.4 10*3/uL (ref 0.9–3.3)

## 2013-01-02 LAB — COMPREHENSIVE METABOLIC PANEL (CC13)
ALT: 16 U/L (ref 0–55)
AST: 18 U/L (ref 5–34)
Albumin: 3.5 g/dL (ref 3.5–5.0)
Alkaline Phosphatase: 88 U/L (ref 40–150)
Anion Gap: 8 mEq/L (ref 3–11)
BUN: 15.5 mg/dL (ref 7.0–26.0)
CO2: 26 mEq/L (ref 22–29)
Calcium: 9 mg/dL (ref 8.4–10.4)
Chloride: 100 mEq/L (ref 98–109)
Creatinine: 0.8 mg/dL (ref 0.6–1.1)
Glucose: 99 mg/dl (ref 70–140)
Potassium: 4.3 mEq/L (ref 3.5–5.1)
Sodium: 134 mEq/L — ABNORMAL LOW (ref 136–145)
Total Bilirubin: 0.22 mg/dL (ref 0.20–1.20)
Total Protein: 7 g/dL (ref 6.4–8.3)

## 2013-01-02 NOTE — Progress Notes (Signed)
OFFICE PROGRESS NOTE  CC  April Bos, MD [address], [address] [place] [address]  DIAGNOSIS: 72 year old female with history of stage I invasive ductal carcinoma status post lumpectomy on 06/16/2009  PRIOR THERAPY:  #1 patient underwent a lumpectomy on 06/16/2009 for a T1 C. N0 ER/PR positive HER-2/neu negative breast cancer. Originally seen by Dr. Pierce Crane  #2 she then started on radiation therapy which he completed on 09/03/2009.  #3 she was then started on letrozole 2.5 mg for 2 years then switch to tamoxifen 20 mg daily but patient could not tolerate tamoxifen and therefore she was begun on Arimidex in October 2013.  CURRENT THERAPY: arimidex 1 mg   INTERVAL HISTORY: April Church 72 y.o. female returns for followup visit. Overall she seems to be doing well she denies any fevers chills night sweats headaches shortness of breath chest pains palpitations no myalgias no vaginal bleeding or discharge. She has not noticed any swelling in her lower extremities. Patient does have a new rash which she states is related to the possible Cymbalta use. She has discontinued this. She has been seen by dermatology she will followup with them for the rash. Remainder of the 10 point review of systems is negative.  MEDICAL HISTORY: Past Medical History  Diagnosis Date  . Tremors of nervous system     hands  . GERD (gastroesophageal reflux disease)   . 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 Cyst of finger 11/2011    annular cyst right long finger  . Hypertension     under control, has been on med. x 2 yrs.  . Dental crowns present   . Asthma     daily inhaler  . Hyperlipidemia   . Breast cancer     ALLERGIES:  is allergic to shellfish allergy; sulfa drugs cross reactors; and codeine.  MEDICATIONS:  Current Outpatient Prescriptions  Medication Sig Dispense Refill  . anastrozole (ARIMIDEX) 1 MG tablet TAKE ONE TABLET BY MOUTH EVERY DAY   30 tablet  5  . aspirin 81 MG tablet Take 81 mg by mouth daily.        . beclomethasone (QVAR) 80 MCG/ACT inhaler Inhale 1 puff into the lungs 2 (two) times daily.       . Calcium Carbonate (CALTRATE 600 PO) Take by mouth 2 (two) times daily.       . Cholecalciferol (VITAMIN D) 1000 UNITS capsule Take 1,000 Units by mouth 2 (two) times daily.       . fluticasone (FLONASE) 50 MCG/ACT nasal spray Place 2 sprays into the nose daily.        . hydrOXYzine (ATARAX/VISTARIL) 25 MG tablet Take 25 mg by mouth at bedtime as needed and may repeat dose one time if needed.      [name] losartan (COZAAR) 50 MG tablet Take 50 mg by mouth daily.      . Omega-3 Fatty Acids (FISH OIL) 1200 MG CAPS Take by mouth.        . primidone (MYSOLINE) 50 MG tablet Take 2 tablets (100 mg total) by mouth 3 (three) times daily.  180 tablet  6  . ranitidine (ZANTAC) 150 MG capsule Take 150 mg by mouth daily after breakfast.       . triamcinolone cream (KENALOG) 0.1 % Apply 1 application topically 2 (two) times daily.      [name] venlafaxine (EFFEXOR) 37.5 MG tablet TAKE ONE TABLET BY MOUTH ONCE DAILY  90  tablet  0   No current facility-administered medications for this visit.    SURGICAL HISTORY:  Past Surgical History  Procedure Laterality Date  . Nasal sinus surgery      x 2  . Foot surgery  06/22/11    left  . Knee arthroscopy  03/12/2005    right  . Nephrectomy living donor  04/2008  . Appendectomy  age 41  . Tumor excision  age 30    right arm  . Cataract extraction, bilateral    . Breast surgery  1999    reduction  . Breast lumpectomy  06/16/2009    left; SLN bx.  . Breast lumpectomy  07/01/2009    re-excision  . Knee arthroscopy      left  . Trigger finger release  12/16/2011    Procedure: RELEASE TRIGGER FINGER/A-1 PULLEY;  Surgeon: Tami Ribas, MD;  Location: [facility];  Service: Orthopedics;  Laterality: Right;  RIGHT LONG FINGER TRIGGER RELEASE & ANNULAR CYST EXCISION    REVIEW OF SYSTEMS:   Pertinent items are noted in HPI.   HEALTH MAINTENANCE:   PHYSICAL EXAMINATION: Blood pressure 122/81, pulse 84, temperature 97.8 F (36.6 C), temperature source Oral, resp. rate 20, height 5' 1.5" (1.562 m), weight 133 lb (60.328 kg). Body mass index is 24.73 kg/(m^2). ECOG PERFORMANCE STATUS: 1 - Symptomatic but completely ambulatory  Patient is awake alert in no acute distress  HEENT exam EOMI PERRLA, sclerae anicteric no conjunctival pallor oral mucosa is moist supple no palpable adenopathy Lungs clear Cardiovascular regular rate rhythm Abdomen soft nontender no HSM Extremities trace edema Left breast: Well-healed surgical scar no nodularity no thickening. Right breast no masses or nipple discharge.    LABORATORY DATA: Lab Results  Component Value Date   WBC 4.1 01/02/2013   HGB 11.5* 01/02/2013   HCT 34.4* 01/02/2013   MCV 91.0 01/02/2013   PLT 263 01/02/2013      Chemistry      Component Value Date/Time   NA 138 06/30/2012 1231   NA 136 12/15/2011 1000   K 4.5 06/30/2012 1231   K 4.4 12/15/2011 1000   CL 102 06/30/2012 1231   CL 98 12/15/2011 1000   CO2 28 06/30/2012 1231   CO2 29 12/15/2011 1000   BUN 17.6 06/30/2012 1231   BUN 15 12/15/2011 1000   CREATININE 0.8 06/30/2012 1231   CREATININE 0.81 12/15/2011 1000      Component Value Date/Time   CALCIUM 9.0 06/30/2012 1231   CALCIUM 9.7 12/15/2011 1000   ALKPHOS 98 06/30/2012 1231   ALKPHOS 44 05/21/2011 1423   AST 19 06/30/2012 1231   AST 18 05/21/2011 1423   ALT 15 06/30/2012 1231   ALT 10 05/21/2011 1423   BILITOT 0.26 06/30/2012 1231   BILITOT 0.2* 05/21/2011 1423       RADIOGRAPHIC STUDIES:  No results found.  ASSESSMENT: 72 year old female with  #1 history of T1 N0 invasive carcinoma of the left breast status post lumpectomy with negative sentinel node. Overall she is doing well. She is currently on Arimidex 1 mg daily.  #2 Rash on Trunk and legs; ?drug rash   PLAN:   #1 continue Arimidex 1 mg daily.  #2  follow up with dermatologist  #3 I will see her back in 6 months time for followup.   All questions were answered. The patient knows to call the clinic with any problems, questions or concerns. We can certainly see the patient much sooner  if necessary.  I spent 15 minutes counseling the patient face to face. The total time spent in the appointment was 20 minutes.    Drue Second, MD Medical/Oncology [facility] [phone] (beeper) [phone] (Office)

## 2013-01-05 ENCOUNTER — Ambulatory Visit: Payer: Medicare Other | Admitting: Oncology

## 2013-01-05 ENCOUNTER — Other Ambulatory Visit: Payer: Medicare Other | Admitting: Lab

## 2013-01-22 ENCOUNTER — Other Ambulatory Visit: Payer: Self-pay | Admitting: Oncology

## 2013-01-23 DIAGNOSIS — L259 Unspecified contact dermatitis, unspecified cause: Secondary | ICD-10-CM | POA: Diagnosis not present

## 2013-02-11 ENCOUNTER — Other Ambulatory Visit: Payer: Self-pay | Admitting: Diagnostic Neuroimaging

## 2013-02-19 ENCOUNTER — Other Ambulatory Visit: Payer: Self-pay | Admitting: Oncology

## 2013-02-19 DIAGNOSIS — C50912 Malignant neoplasm of unspecified site of left female breast: Secondary | ICD-10-CM

## 2013-03-15 DIAGNOSIS — Z853 Personal history of malignant neoplasm of breast: Secondary | ICD-10-CM | POA: Diagnosis not present

## 2013-03-19 ENCOUNTER — Encounter (INDEPENDENT_AMBULATORY_CARE_PROVIDER_SITE_OTHER): Payer: Self-pay

## 2013-03-23 ENCOUNTER — Other Ambulatory Visit: Payer: Self-pay | Admitting: Oncology

## 2013-03-23 DIAGNOSIS — M6740 Ganglion, unspecified site: Secondary | ICD-10-CM | POA: Diagnosis not present

## 2013-03-23 DIAGNOSIS — C50919 Malignant neoplasm of unspecified site of unspecified female breast: Secondary | ICD-10-CM

## 2013-03-26 DIAGNOSIS — Z5189 Encounter for other specified aftercare: Secondary | ICD-10-CM | POA: Diagnosis not present

## 2013-03-26 DIAGNOSIS — J309 Allergic rhinitis, unspecified: Secondary | ICD-10-CM | POA: Diagnosis not present

## 2013-03-26 DIAGNOSIS — L299 Pruritus, unspecified: Secondary | ICD-10-CM | POA: Diagnosis not present

## 2013-03-26 DIAGNOSIS — J45909 Unspecified asthma, uncomplicated: Secondary | ICD-10-CM | POA: Diagnosis not present

## 2013-04-04 ENCOUNTER — Encounter (INDEPENDENT_AMBULATORY_CARE_PROVIDER_SITE_OTHER): Payer: Self-pay | Admitting: General Surgery

## 2013-04-13 ENCOUNTER — Telehealth: Payer: Self-pay | Admitting: Adult Health

## 2013-04-13 NOTE — Telephone Encounter (Signed)
, °

## 2013-04-23 DIAGNOSIS — L299 Pruritus, unspecified: Secondary | ICD-10-CM | POA: Diagnosis not present

## 2013-04-23 DIAGNOSIS — Z0000 Encounter for general adult medical examination without abnormal findings: Secondary | ICD-10-CM | POA: Diagnosis not present

## 2013-04-23 DIAGNOSIS — R21 Rash and other nonspecific skin eruption: Secondary | ICD-10-CM | POA: Diagnosis not present

## 2013-04-23 DIAGNOSIS — E7800 Pure hypercholesterolemia, unspecified: Secondary | ICD-10-CM | POA: Diagnosis not present

## 2013-05-01 DIAGNOSIS — L570 Actinic keratosis: Secondary | ICD-10-CM | POA: Diagnosis not present

## 2013-05-01 DIAGNOSIS — L820 Inflamed seborrheic keratosis: Secondary | ICD-10-CM | POA: Diagnosis not present

## 2013-05-01 DIAGNOSIS — L259 Unspecified contact dermatitis, unspecified cause: Secondary | ICD-10-CM | POA: Diagnosis not present

## 2013-05-29 DIAGNOSIS — Z23 Encounter for immunization: Secondary | ICD-10-CM | POA: Diagnosis not present

## 2013-06-13 ENCOUNTER — Encounter: Payer: Self-pay | Admitting: Nurse Practitioner

## 2013-06-13 ENCOUNTER — Ambulatory Visit (INDEPENDENT_AMBULATORY_CARE_PROVIDER_SITE_OTHER): Payer: Medicare Other | Admitting: Nurse Practitioner

## 2013-06-13 VITALS — BP 118/82 | HR 89 | Ht 61.0 in | Wt 132.0 lb

## 2013-06-13 DIAGNOSIS — G250 Essential tremor: Secondary | ICD-10-CM | POA: Diagnosis not present

## 2013-06-13 DIAGNOSIS — G252 Other specified forms of tremor: Secondary | ICD-10-CM

## 2013-06-13 DIAGNOSIS — M47812 Spondylosis without myelopathy or radiculopathy, cervical region: Secondary | ICD-10-CM | POA: Diagnosis not present

## 2013-06-13 MED ORDER — PRIMIDONE 50 MG PO TABS: 100.0000 mg | ORAL_TABLET | Freq: Three times a day (TID) | ORAL | Status: DC

## 2013-06-13 NOTE — Progress Notes (Signed)
[facility]  PATIENT: April Church DOB: Dec 18, 1940   REASON FOR VISIT: Followup for essential tremor   HISTORY OF PRESENT ILLNESS: April Church, 73 year old female returns for followup. She has  history of benign essential tremor which is stable. She also has a vocal tremor. Her tremor does not limit any of her activities of daily life. She denies any side effects of the Mysoline. She also reports today that she's had some numbness and tingling down her right arm from her neck and shoulder. It has been bothering her more in the last 6 months. 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 She saw Dr. Arnoldo Morale back in 2004.  She returns for reevaluation   HISTORY: BUE tremor is stable and controlled. Voice tremor is slightly progressing. Tolerating primidone.  PRIOR HPI (06/09/11): 73 yrs old female returns for F/U. Last seen 06/09/10. She has been followed in this office for many years for essential tremor. She takes primidone 50 mg, 1.5 TID. She tolerates it well. She comes in today saying that her tremor is a little worse before lunch. The tremor limits her a little bit with eating and writing but for the most part she is happy with her level of function.She has hx of breast cancer. No neurologic complaints.    REVIEW OF SYSTEMS: Full 14 system review of systems performed and notable only for those listed, all others are neg:  Constitutional: N/A  Cardiovascular: N/A  Ear/Nose/Throat: N/A  Skin: N/A  Eyes: Sensitivity to light Respiratory: N/A  Gastroitestinal: N/A  Hematology/Lymphatic: N/A  Endocrine: N/A Musculoskeletal: Neck pain, aching muscles Allergy/Immunology: N/A  Neurological: Tremors, numbness tingling down right arm Psychiatric: N/A   ALLERGIES: Allergies  Allergen Reactions  . Shellfish Allergy Shortness Of Breath  . Sulfa Drugs Cross Reactors Anxiety and Rash  . Codeine Anxiety     HOME MEDICATIONS: Outpatient Prescriptions Prior to Visit  Medication Sig Dispense Refill  . anastrozole (ARIMIDEX) 1 MG tablet TAKE ONE TABLET BY MOUTH ONCE DAILY  30 tablet  4  . aspirin 81 MG tablet Take 81 mg by mouth daily.        . beclomethasone (QVAR) 80 MCG/ACT inhaler Inhale 1 puff into the lungs 2 (two) times daily.       . Calcium Carbonate (CALTRATE 600 PO) Take by mouth 2 (two) times daily.       . Cholecalciferol (VITAMIN D) 1000 UNITS capsule Take 1,000 Units by mouth 2 (two) times daily.       . fluticasone (FLONASE) 50 MCG/ACT nasal spray Place 2 sprays into the nose daily.        . hydrOXYzine (ATARAX/VISTARIL) 25 MG tablet Take 25 mg by mouth at bedtime as needed and may repeat dose one time if needed.      [name] losartan (COZAAR) 50 MG tablet Take 50 mg by mouth daily.      . Omega-3 Fatty Acids (FISH OIL) 1200 MG CAPS Take by mouth.        . primidone (MYSOLINE) 50 MG tablet TAKE 2 TABLETS BY MOUTH THREE TIMES DAILY  180 tablet  3  . ranitidine (ZANTAC) 150 MG capsule Take 150 mg by mouth daily after breakfast.       . triamcinolone cream (KENALOG) 0.1 % Apply 1 application topically 2 (two) times daily.      [name] venlafaxine (EFFEXOR) 37.5 MG tablet TAKE ONE TABLET BY MOUTH ONCE DAILY  90  tablet  1   No facility-administered medications prior to visit.    PAST MEDICAL HISTORY: Past Medical History  Diagnosis Date  . Tremors of nervous system     hands  . GERD (gastroesophageal reflux disease)   . 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 Cyst of finger 11/2011    annular cyst right long finger  . Hypertension     under control, has been on med. x 2 yrs.  . Dental crowns present   . Asthma     daily inhaler  . Hyperlipidemia   . Breast cancer     PAST SURGICAL HISTORY: Past Surgical History  Procedure Laterality Date  . Nasal sinus surgery      x 2  . Foot surgery  06/22/11    left  . Knee arthroscopy  03/12/2005    right  . Nephrectomy  living donor  04/2008  . Appendectomy  age 8  . Tumor excision  age 15    right arm  . Cataract extraction, bilateral    . Breast surgery  1999    reduction  . Breast lumpectomy  06/16/2009    left; SLN bx.  . Breast lumpectomy  07/01/2009    re-excision  . Knee arthroscopy      left  . Trigger finger release  12/16/2011    Procedure: RELEASE TRIGGER FINGER/A-1 PULLEY;  Surgeon: Tennis Must, MD;  Location: [place];  Service: Orthopedics;  Laterality: Right;  RIGHT LONG FINGER TRIGGER RELEASE & ANNULAR CYST EXCISION    FAMILY HISTORY: Family History  Problem Relation Age of Onset  . Kidney disease Mother   . Stroke Father   . Diabetes Brother   . Heart disease Brother   . Cancer Sister     SOCIAL HISTORY: History   Social History  . Marital Status: Married    Spouse Name: Marcello Moores     Number of Children: 1  . Years of Education: HS   Occupational History  . Retired    Social History Main Topics  . Smoking status: Never Smoker   . Smokeless tobacco: Never Used  . Alcohol Use: No  . Drug Use: No  . Sexual Activity: Yes   Other Topics Concern  . Not on file   Social History Narrative      Pt lives at home with her spouse.  [name]    Patient has 1 child.    Patient is retired.    Patient has a HS education.    Patient is right handed.    Patient drinks caffeine occasionally.                PHYSICAL EXAM  Filed Vitals:   06/13/13 1329  BP: 118/82  Pulse: 89  Height: 5\' 1"  (1.549 m)  Weight: 132 lb (59.875 kg)   Body mass index is 24.95 kg/(m^2).  Generalized: Well developed, in no acute distress  Head: normocephalic and atraumatic,. Oropharynx benign  Neck: Supple, no carotid bruits  Cardiac: Regular rate rhythm, no murmur  Musculoskeletal: No deformity   Neurological examination   Mentation: Alert oriented to time, place, history taking. Follows all commands speech and language fluent  Cranial nerve II-XII: Pupils were  equal round reactive to light extraocular movements were full, visual field were full on confrontational test. Facial sensation and strength were normal. hearing was intact to finger rubbing bilaterally. Uvula tongue midline. head turning and shoulder shrug were  normal and symmetric.Tongue protrusion into cheek strength was normal. Vocal  tremor Motor: normal bulk and tone, full strength in the BUE, BLE,  No focal weakness. Very mild postural tremor in both extremities Sensory: normal and symmetric to light touch, pinprick, and  vibration in the upper extremities  Coordination: finger-nose-finger, heel-to-shin bilaterally, no dysmetria Reflexes: Brachioradialis 2/2, biceps 2/2, triceps 2/2, patellar 2/2, Achilles 2/2, plantar responses were flexor bilaterally. Gait and Station: Rising up from seated position without assistance, normal stance,  moderate stride, good arm swing, smooth turning, able to perform tiptoe, and heel walking without difficulty. Tandem gait is steady  DIAGNOSTIC DATA (LABS, IMAGING, TESTING) - I reviewed patient records, labs, notes, testing and imaging myself where available.  Lab Results  Component Value Date   WBC 4.1 01/02/2013   HGB 11.5* 01/02/2013   HCT 34.4* 01/02/2013   MCV 91.0 01/02/2013   PLT 263 01/02/2013      Component Value Date/Time   NA 134* 01/02/2013 1343   NA 136 12/15/2011 1000   K 4.3 01/02/2013 1343   K 4.4 12/15/2011 1000   CL 102 06/30/2012 1231   CL 98 12/15/2011 1000   CO2 26 01/02/2013 1343   CO2 29 12/15/2011 1000   GLUCOSE 99 01/02/2013 1343   GLUCOSE 77 06/30/2012 1231   GLUCOSE 98 12/15/2011 1000   BUN 15.5 01/02/2013 1343   BUN 15 12/15/2011 1000   CREATININE 0.8 01/02/2013 1343   CREATININE 0.81 12/15/2011 1000   CALCIUM 9.0 01/02/2013 1343   CALCIUM 9.7 12/15/2011 1000   PROT 7.0 01/02/2013 1343   PROT 6.7 05/21/2011 1423   ALBUMIN 3.5 01/02/2013 1343   ALBUMIN 4.0 05/21/2011 1423   AST 18 01/02/2013 1343   AST 18  05/21/2011 1423   ALT 16 01/02/2013 1343   ALT 10 05/21/2011 1423   ALKPHOS 88 01/02/2013 1343   ALKPHOS 44 05/21/2011 1423   BILITOT 0.22 01/02/2013 1343   BILITOT 0.2* 05/21/2011 1423   GFRNONAA 71* 12/15/2011 1000   GFRAA 34* 12/15/2011 1000    ASSESSMENT AND PLAN  73 y.o. year old female  has a past medical history of Tremors of nervous system;  Trigger finger of right hand (11/2011); Hypertension;  Hyperlipidemia; and Breast cancer and cervical spondylosis  here to follow up.   Will get EMG nerve conduction  right upper extremity for  complaint of numbness, tingling (Has seen Dr. Arnoldo Morale in the past) Tremor appears stable Will renew Mysoline Followup yearly and when necessary Dennie Bible, [facility], [facility], [facility] [address] [place],  [address] [phone]

## 2013-06-13 NOTE — Patient Instructions (Addendum)
Will get EMG nerve conduction right upper extremity for  complaint of numbness, tingling (Has seen Dr. Arnoldo Morale in the past) Tremor appears stable Will renew Mysoline Followup yearly and when necessary

## 2013-06-14 ENCOUNTER — Other Ambulatory Visit: Payer: Self-pay

## 2013-06-14 DIAGNOSIS — L821 Other seborrheic keratosis: Secondary | ICD-10-CM | POA: Diagnosis not present

## 2013-06-14 DIAGNOSIS — L2089 Other atopic dermatitis: Secondary | ICD-10-CM | POA: Diagnosis not present

## 2013-06-14 DIAGNOSIS — D485 Neoplasm of uncertain behavior of skin: Secondary | ICD-10-CM | POA: Diagnosis not present

## 2013-06-14 DIAGNOSIS — L820 Inflamed seborrheic keratosis: Secondary | ICD-10-CM | POA: Diagnosis not present

## 2013-06-15 NOTE — Progress Notes (Signed)
I reviewed note and agree with plan.   Penni Bombard, MD 4/31/5400, 8:67 AM Certified in Neurology, Neurophysiology and Neuroimaging  [facility] [address] [place], [place] [address] [phone]

## 2013-06-25 ENCOUNTER — Ambulatory Visit (INDEPENDENT_AMBULATORY_CARE_PROVIDER_SITE_OTHER): Payer: Medicare Other | Admitting: Diagnostic Neuroimaging

## 2013-06-25 ENCOUNTER — Encounter (INDEPENDENT_AMBULATORY_CARE_PROVIDER_SITE_OTHER): Payer: Self-pay

## 2013-06-25 DIAGNOSIS — G250 Essential tremor: Secondary | ICD-10-CM

## 2013-06-25 DIAGNOSIS — Z0289 Encounter for other administrative examinations: Secondary | ICD-10-CM

## 2013-06-25 DIAGNOSIS — M47812 Spondylosis without myelopathy or radiculopathy, cervical region: Secondary | ICD-10-CM | POA: Diagnosis not present

## 2013-06-25 NOTE — Procedures (Signed)
   [facility]  NCS (NERVE CONDUCTION STUDY) WITH EMG (ELECTROMYOGRAPHY) REPORT   STUDY DATE: 06/25/13 PATIENT NAME: April Church DOB: Mar 07, 1940 MRN: [id number]  ORDERING CLINICIAN: Andrey Spearman, MD   TECHNOLOGIST: Laretta Alstrom  ELECTROMYOGRAPHER: Earlean Polka. Loral Campi, MD  CLINICAL INFORMATION: 73 year old female with right neck and right arm pain.  FINDINGS: NERVE CONDUCTION STUDY: Bilateral median and ulnar motor responses and F-wave latencies are normal. Bilateral median and ulnar sensory responses are normal.  NEEDLE ELECTROMYOGRAPHY: Needle exam of right upper extremity (deltoid, triceps, biceps, flexor carpi radialis, first dorsal interosseous) shows no abnormal spontaneous activity at rest and decreased motor unit recruitment in the right triceps muscle on exertion. Other muscles are normal.  Right C6-7 paraspinal muscles demonstrate 1-2+ positive sharp waves and fibrillation potentials.  IMPRESSION:  Abnormal study demonstrating: 1. 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 2. No evidence of widespread underlying large fiber neuropathy.   INTERPRETING PHYSICIAN:  Penni Bombard, MD Certified in Neurology, Neurophysiology and Neuroimaging  [id number] [address], [place], [place] [address] [phone]

## 2013-06-26 ENCOUNTER — Other Ambulatory Visit: Payer: Self-pay | Admitting: Nurse Practitioner

## 2013-06-26 DIAGNOSIS — M47812 Spondylosis without myelopathy or radiculopathy, cervical region: Secondary | ICD-10-CM

## 2013-06-29 ENCOUNTER — Other Ambulatory Visit: Payer: Self-pay | Admitting: Diagnostic Neuroimaging

## 2013-07-09 ENCOUNTER — Telehealth: Payer: Self-pay | Admitting: *Deleted

## 2013-07-09 NOTE — Telephone Encounter (Signed)
Appt for pt with Dr. Arnoldo Morale at [facility] 08-14-13 at 1030.  Per fax pt has been notified.

## 2013-07-16 ENCOUNTER — Ambulatory Visit: Payer: Medicare Other | Admitting: Oncology

## 2013-07-16 ENCOUNTER — Other Ambulatory Visit: Payer: Medicare Other

## 2013-07-19 ENCOUNTER — Other Ambulatory Visit (HOSPITAL_BASED_OUTPATIENT_CLINIC_OR_DEPARTMENT_OTHER): Payer: Medicare Other

## 2013-07-19 ENCOUNTER — Ambulatory Visit (HOSPITAL_BASED_OUTPATIENT_CLINIC_OR_DEPARTMENT_OTHER): Payer: Medicare Other | Admitting: Adult Health

## 2013-07-19 ENCOUNTER — Encounter: Payer: Self-pay | Admitting: Adult Health

## 2013-07-19 VITALS — BP 145/90 | HR 80 | Temp 98.3°F | Resp 20 | Ht 61.0 in | Wt 132.9 lb

## 2013-07-19 DIAGNOSIS — Z170 Estrogen receptor positive status [ER+]: Secondary | ICD-10-CM | POA: Diagnosis not present

## 2013-07-19 DIAGNOSIS — M79609 Pain in unspecified limb: Secondary | ICD-10-CM

## 2013-07-19 DIAGNOSIS — C50912 Malignant neoplasm of unspecified site of left female breast: Secondary | ICD-10-CM

## 2013-07-19 DIAGNOSIS — C50919 Malignant neoplasm of unspecified site of unspecified female breast: Secondary | ICD-10-CM | POA: Diagnosis not present

## 2013-07-19 DIAGNOSIS — C50119 Malignant neoplasm of central portion of unspecified female breast: Secondary | ICD-10-CM | POA: Diagnosis not present

## 2013-07-19 DIAGNOSIS — E2839 Other primary ovarian failure: Secondary | ICD-10-CM

## 2013-07-19 LAB — CBC WITH DIFFERENTIAL/PLATELET
BASO%: 0.2 % (ref 0.0–2.0)
Basophils Absolute: 0 10*3/uL (ref 0.0–0.1)
EOS%: 0.9 % (ref 0.0–7.0)
Eosinophils Absolute: 0 10*3/uL (ref 0.0–0.5)
HCT: 34.5 % — ABNORMAL LOW (ref 34.8–46.6)
HGB: 11.6 g/dL (ref 11.6–15.9)
LYMPH%: 30.2 % (ref 14.0–49.7)
MCH: 30.1 pg (ref 25.1–34.0)
MCHC: 33.6 g/dL (ref 31.5–36.0)
MCV: 89.4 fL (ref 79.5–101.0)
MONO#: 0.5 10*3/uL (ref 0.1–0.9)
MONO%: 10.4 % (ref 0.0–14.0)
NEUT#: 2.7 10*3/uL (ref 1.5–6.5)
NEUT%: 58.3 % (ref 38.4–76.8)
Platelets: 244 10*3/uL (ref 145–400)
RBC: 3.86 10*6/uL (ref 3.70–5.45)
RDW: 12.6 % (ref 11.2–14.5)
WBC: 4.6 10*3/uL (ref 3.9–10.3)
lymph#: 1.4 10*3/uL (ref 0.9–3.3)

## 2013-07-19 LAB — COMPREHENSIVE METABOLIC PANEL (CC13)
ALT: 15 U/L (ref 0–55)
AST: 18 U/L (ref 5–34)
Albumin: 3.8 g/dL (ref 3.5–5.0)
Alkaline Phosphatase: 77 U/L (ref 40–150)
Anion Gap: 11 mEq/L (ref 3–11)
BUN: 19.5 mg/dL (ref 7.0–26.0)
CO2: 23 mEq/L (ref 22–29)
Calcium: 8.9 mg/dL (ref 8.4–10.4)
Chloride: 98 mEq/L (ref 98–109)
Creatinine: 0.8 mg/dL (ref 0.6–1.1)
Glucose: 135 mg/dl (ref 70–140)
Potassium: 4.3 mEq/L (ref 3.5–5.1)
Sodium: 131 mEq/L — ABNORMAL LOW (ref 136–145)
Total Bilirubin: 0.25 mg/dL (ref 0.20–1.20)
Total Protein: 7.1 g/dL (ref 6.4–8.3)

## 2013-07-19 NOTE — Patient Instructions (Signed)
You are doing well.  You have no sign of recurrence.  Continue taking Arimidex daily.  I recommend a healthy diet, exercise, and monthly self breast exams.  We will see you back in 6 months.  Please call us if you have any questions or concerns.

## 2013-07-19 NOTE — Progress Notes (Signed)
OFFICE PROGRESS NOTE  CC  Horton Finer, MD [address], [address] [place] [address]  DIAGNOSIS: 73 year old female with history of stage I invasive ductal carcinoma status post lumpectomy on 06/16/2009  PRIOR THERAPY:  #1 patient underwent a lumpectomy on 06/16/2009 for a T1 C. N0 ER/PR positive HER-2/neu negative breast cancer. Originally seen by Dr. Eston Esters  #2 she then started on radiation therapy which he completed on 09/03/2009.  #3 she was then started on letrozole 2.5 mg for 2 years then switch to tamoxifen 20 mg daily but patient could not tolerate tamoxifen and therefore she was begun on Arimidex in October 2013.  CURRENT THERAPY: arimidex 1 mg   INTERVAL HISTORY: Adira M Flammia 73 y.o. female returns for followup visit. She is taking Arimidex $RemoveBeforeD'1mg'[id number]$  daily and tolerating it moderately well.  She does have some mild joint aches in her legs with taking the Arimidex.  This is worse at night than during the day.  She does have dry eyes that she puts over the counter drops in.  She has vaginal dryness, but does not experience any dyspareunia, or frequent UTI's.  She is otherwise doing well, and a 10 point ROS is otherwise neg.   MEDICAL HISTORY: Past Medical History  Diagnosis Date  . Tremors of nervous system     hands  . GERD (gastroesophageal reflux disease)   . 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 Cyst of finger 11/2011    annular cyst right long finger  . Hypertension     under control, has been on med. x 2 yrs.  . Dental crowns present   . Asthma     daily inhaler  . Hyperlipidemia   . Breast cancer     ALLERGIES:  is allergic to shellfish allergy; sulfa drugs cross reactors; and codeine.  MEDICATIONS:  Current Outpatient Prescriptions  Medication Sig Dispense Refill  . anastrozole (ARIMIDEX) 1 MG tablet TAKE ONE TABLET BY MOUTH ONCE DAILY  30 tablet  4  . aspirin 81 MG tablet Take 81 mg by mouth daily.        .  beclomethasone (QVAR) 80 MCG/ACT inhaler Inhale 1 puff into the lungs 2 (two) times daily.       . Calcium Carbonate (CALTRATE 600 PO) Take by mouth 2 (two) times daily.       . Cholecalciferol (VITAMIN D) 1000 UNITS capsule Take 1,000 Units by mouth 2 (two) times daily.       [name] losartan (COZAAR) 50 MG tablet Take 50 mg by mouth daily.      . Omega-3 Fatty Acids (FISH OIL) 1200 MG CAPS Take by mouth.        . primidone (MYSOLINE) 50 MG tablet Take 2 tablets (100 mg total) by mouth 3 (three) times daily.  540 tablet  1  . ranitidine (ZANTAC) 150 MG capsule Take 150 mg by mouth daily after breakfast.       . triamcinolone cream (KENALOG) 0.1 % Apply 1 application topically 2 (two) times daily.      [name] venlafaxine (EFFEXOR) 37.5 MG tablet TAKE ONE TABLET BY MOUTH ONCE DAILY  90 tablet  1  . fluticasone (FLONASE) 50 MCG/ACT nasal spray Place 2 sprays into the nose daily.        . hydrOXYzine (ATARAX/VISTARIL) 25 MG tablet Take 25 mg by mouth at bedtime as needed and may repeat dose one time if needed.  No current facility-administered medications for this visit.    SURGICAL HISTORY:  Past Surgical History  Procedure Laterality Date  . Nasal sinus surgery      x 2  . Foot surgery  06/22/11    left  . Knee arthroscopy  03/12/2005    right  . Nephrectomy living donor  04/2008  . Appendectomy  age 53  . Tumor excision  age 61    right arm  . Cataract extraction, bilateral    . Breast surgery  1999    reduction  . Breast lumpectomy  06/16/2009    left; SLN bx.  . Breast lumpectomy  07/01/2009    re-excision  . Knee arthroscopy      left  . Trigger finger release  12/16/2011    Procedure: RELEASE TRIGGER FINGER/A-1 PULLEY;  Surgeon: Tami Ribas, MD;  Location: [facility];  Service: Orthopedics;  Laterality: Right;  RIGHT LONG FINGER TRIGGER RELEASE & ANNULAR CYST EXCISION    REVIEW OF SYSTEMS:  A 10 point review of systems was conducted and is otherwise negative except  for what is noted above.     HEALTH MAINTENANCE:  Mammogram:02/2013 Colonoscopy: due, patient undergoes every 5 years Bone Density Scan: 07/2011 Pap Smear: 07/23/13 Eye Exam: 12/2012 Vitamin D Level: 11/2011 Lipid Panel: 04/2013    PHYSICAL EXAMINATION: Blood pressure 145/90, pulse 80, temperature 98.3 F (36.8 C), temperature source Oral, resp. rate 20, height 5\' 1"  (1.549 m), weight 132 lb 14.4 oz (60.283 kg). Body mass index is 25.12 kg/(m^2).  GENERAL: Patient is a well appearing female in no acute distress HEENT:  Sclerae anicteric.  Oropharynx clear and moist. No ulcerations or evidence of oropharyngeal candidiasis. Neck is supple.  NODES:  No cervical, supraclavicular, or axillary lymphadenopathy palpated.  BREAST EXAM:  S/p left lumpectomy, no nodularity, no masses, right breast no masses or nodules, benign bilateral breast exam LUNGS:  Clear to auscultation bilaterally.  No wheezes or rhonchi. HEART:  Regular rate and rhythm. No murmur appreciated. ABDOMEN:  Soft, nontender.  Positive, normoactive bowel sounds. No organomegaly palpated. MSK:  No focal spinal tenderness to palpation. Full range of motion bilaterally in the upper extremities. EXTREMITIES:  No peripheral edema.   SKIN:  Clear with no obvious rashes or skin changes. No nail dyscrasia. NEURO:  Nonfocal. Well oriented.  Appropriate affect. ECOG PERFORMANCE STATUS: 1 - Symptomatic but completely ambulatory      LABORATORY DATA: Lab Results  Component Value Date   WBC 4.6 07/19/2013   HGB 11.6 07/19/2013   HCT 34.5* 07/19/2013   MCV 89.4 07/19/2013   PLT 244 07/19/2013      Chemistry      Component Value Date/Time   NA 131* 07/19/2013 1324   NA 136 12/15/2011 1000   K 4.3 07/19/2013 1324   K 4.4 12/15/2011 1000   CL 102 06/30/2012 1231   CL 98 12/15/2011 1000   CO2 23 07/19/2013 1324   CO2 29 12/15/2011 1000   BUN 19.5 07/19/2013 1324   BUN 15 12/15/2011 1000   CREATININE 0.8 07/19/2013 1324   CREATININE  0.81 12/15/2011 1000      Component Value Date/Time   CALCIUM 8.9 07/19/2013 1324   CALCIUM 9.7 12/15/2011 1000   ALKPHOS 77 07/19/2013 1324   ALKPHOS 44 05/21/2011 1423   AST 18 07/19/2013 1324   AST 18 05/21/2011 1423   ALT 15 07/19/2013 1324   ALT 10 05/21/2011 1423   BILITOT 0.25 07/19/2013  1324   BILITOT 0.2* 05/21/2011 1423       RADIOGRAPHIC STUDIES:  No results found.  ASSESSMENT: 73 year old female with  #1 history of T1 N0 invasive carcinoma of the left breast status post lumpectomy with negative sentinel node. Overall she is doing well. She is currently on Arimidex 1 mg daily.   PLAN:   Ms. Pfefferle is doing well today.  She has no sign of recurrence.  She will continue taking Arimidex daily and she is tolerating this well.  I reviewed her health maintenance with her in detail.  She is due for a bone density again, I will order this.  We discussed survivorship and I recommended healthy diet, exercise, and monthly self breast exams.    The patient will return in 6 months for labs and evaluation.   She knows to call us in the interim for any questions or concerns.  We can certainly see her sooner if needed.   I spent 25 minutes counseling the patient face to face. The total time spent in the appointment was 30 minutes.  Minette Headland, [place] [phone]

## 2013-07-24 ENCOUNTER — Telehealth: Payer: Self-pay | Admitting: Hematology and Oncology

## 2013-07-24 NOTE — Telephone Encounter (Signed)
, °

## 2013-07-25 DIAGNOSIS — Z923 Personal history of irradiation: Secondary | ICD-10-CM | POA: Diagnosis not present

## 2013-07-25 DIAGNOSIS — Z524 Kidney donor: Secondary | ICD-10-CM | POA: Diagnosis not present

## 2013-07-25 DIAGNOSIS — C50919 Malignant neoplasm of unspecified site of unspecified female breast: Secondary | ICD-10-CM | POA: Diagnosis not present

## 2013-07-25 DIAGNOSIS — I10 Essential (primary) hypertension: Secondary | ICD-10-CM | POA: Diagnosis not present

## 2013-07-25 DIAGNOSIS — Z905 Acquired absence of kidney: Secondary | ICD-10-CM | POA: Diagnosis not present

## 2013-07-26 DIAGNOSIS — Z780 Asymptomatic menopausal state: Secondary | ICD-10-CM | POA: Diagnosis not present

## 2013-07-27 ENCOUNTER — Telehealth: Payer: Self-pay

## 2013-07-27 NOTE — Telephone Encounter (Signed)
Faxed bone density order to [place].  Sent to scan.

## 2013-07-31 ENCOUNTER — Telehealth: Payer: Self-pay

## 2013-07-31 NOTE — Telephone Encounter (Signed)
Received by mail bone density report from [facility] dtd 07/26/13 Dr. Marcelo Baldy.  Copy to [place].  Original to scan.

## 2013-07-31 NOTE — Telephone Encounter (Signed)
Received by mail bone density report from [facility] mammography dtd 07/26/13 Dr. Marcelo Baldy.  Copy to [place].  Original to scan.

## 2013-08-01 ENCOUNTER — Telehealth: Payer: Self-pay | Admitting: *Deleted

## 2013-08-01 NOTE — Telephone Encounter (Signed)
Called pt & informed per April Massed NP that bone density is normal & encouraged to cont calcium, Vit D, & wt bearing exercises.  She expressed appreciation for call.

## 2013-08-03 DIAGNOSIS — M6530 Trigger finger, unspecified finger: Secondary | ICD-10-CM | POA: Diagnosis not present

## 2013-08-10 DIAGNOSIS — Z124 Encounter for screening for malignant neoplasm of cervix: Secondary | ICD-10-CM | POA: Diagnosis not present

## 2013-08-10 DIAGNOSIS — Z01419 Encounter for gynecological examination (general) (routine) without abnormal findings: Secondary | ICD-10-CM | POA: Diagnosis not present

## 2013-08-13 ENCOUNTER — Other Ambulatory Visit: Payer: Self-pay | Admitting: Oncology

## 2013-08-14 DIAGNOSIS — L820 Inflamed seborrheic keratosis: Secondary | ICD-10-CM | POA: Diagnosis not present

## 2013-08-14 DIAGNOSIS — D1801 Hemangioma of skin and subcutaneous tissue: Secondary | ICD-10-CM | POA: Diagnosis not present

## 2013-08-14 DIAGNOSIS — M542 Cervicalgia: Secondary | ICD-10-CM | POA: Diagnosis not present

## 2013-08-14 DIAGNOSIS — M5412 Radiculopathy, cervical region: Secondary | ICD-10-CM | POA: Diagnosis not present

## 2013-08-29 DIAGNOSIS — M5030 Other cervical disc degeneration, unspecified cervical region: Secondary | ICD-10-CM | POA: Diagnosis not present

## 2013-08-29 DIAGNOSIS — R1909 Other intra-abdominal and pelvic swelling, mass and lump: Secondary | ICD-10-CM | POA: Diagnosis not present

## 2013-08-29 DIAGNOSIS — M4802 Spinal stenosis, cervical region: Secondary | ICD-10-CM | POA: Diagnosis not present

## 2013-08-29 DIAGNOSIS — M5412 Radiculopathy, cervical region: Secondary | ICD-10-CM | POA: Diagnosis not present

## 2013-08-29 DIAGNOSIS — Z8041 Family history of malignant neoplasm of ovary: Secondary | ICD-10-CM | POA: Diagnosis not present

## 2013-08-29 DIAGNOSIS — Z853 Personal history of malignant neoplasm of breast: Secondary | ICD-10-CM | POA: Diagnosis not present

## 2013-08-29 DIAGNOSIS — C50919 Malignant neoplasm of unspecified site of unspecified female breast: Secondary | ICD-10-CM | POA: Diagnosis not present

## 2013-09-12 ENCOUNTER — Other Ambulatory Visit: Payer: Self-pay

## 2013-09-12 DIAGNOSIS — L820 Inflamed seborrheic keratosis: Secondary | ICD-10-CM | POA: Diagnosis not present

## 2013-09-12 DIAGNOSIS — D485 Neoplasm of uncertain behavior of skin: Secondary | ICD-10-CM | POA: Diagnosis not present

## 2013-09-17 DIAGNOSIS — M5412 Radiculopathy, cervical region: Secondary | ICD-10-CM | POA: Diagnosis not present

## 2013-09-17 DIAGNOSIS — M542 Cervicalgia: Secondary | ICD-10-CM | POA: Diagnosis not present

## 2013-09-24 ENCOUNTER — Other Ambulatory Visit: Payer: Self-pay | Admitting: Adult Health

## 2013-09-28 DIAGNOSIS — L299 Pruritus, unspecified: Secondary | ICD-10-CM | POA: Diagnosis not present

## 2013-09-28 DIAGNOSIS — J3801 Paralysis of vocal cords and larynx, unilateral: Secondary | ICD-10-CM | POA: Diagnosis not present

## 2013-09-28 DIAGNOSIS — J392 Other diseases of pharynx: Secondary | ICD-10-CM | POA: Diagnosis not present

## 2013-09-28 DIAGNOSIS — J45909 Unspecified asthma, uncomplicated: Secondary | ICD-10-CM | POA: Diagnosis not present

## 2013-09-28 DIAGNOSIS — J387 Other diseases of larynx: Secondary | ICD-10-CM | POA: Diagnosis not present

## 2013-09-28 DIAGNOSIS — J309 Allergic rhinitis, unspecified: Secondary | ICD-10-CM | POA: Diagnosis not present

## 2013-09-28 DIAGNOSIS — T782XXA Anaphylactic shock, unspecified, initial encounter: Secondary | ICD-10-CM | POA: Diagnosis not present

## 2013-09-28 DIAGNOSIS — Z79899 Other long term (current) drug therapy: Secondary | ICD-10-CM | POA: Diagnosis not present

## 2013-09-28 DIAGNOSIS — J069 Acute upper respiratory infection, unspecified: Secondary | ICD-10-CM | POA: Diagnosis not present

## 2013-09-28 DIAGNOSIS — J029 Acute pharyngitis, unspecified: Secondary | ICD-10-CM | POA: Diagnosis not present

## 2013-10-04 ENCOUNTER — Other Ambulatory Visit: Payer: Self-pay | Admitting: Neurosurgery

## 2013-10-10 DIAGNOSIS — Z961 Presence of intraocular lens: Secondary | ICD-10-CM | POA: Diagnosis not present

## 2013-10-10 DIAGNOSIS — H02839 Dermatochalasis of unspecified eye, unspecified eyelid: Secondary | ICD-10-CM | POA: Diagnosis not present

## 2013-10-22 ENCOUNTER — Other Ambulatory Visit: Payer: Self-pay | Admitting: Adult Health

## 2013-11-01 ENCOUNTER — Other Ambulatory Visit: Payer: Self-pay | Admitting: *Deleted

## 2013-11-01 DIAGNOSIS — C50919 Malignant neoplasm of unspecified site of unspecified female breast: Secondary | ICD-10-CM

## 2013-11-01 MED ORDER — VENLAFAXINE HCL 37.5 MG PO TABS: ORAL_TABLET | ORAL | Status: DC

## 2013-11-05 ENCOUNTER — Encounter (HOSPITAL_COMMUNITY): Payer: Self-pay | Admitting: Pharmacy Technician

## 2013-11-06 ENCOUNTER — Encounter (HOSPITAL_COMMUNITY)
Admission: RE | Admit: 2013-11-06 | Discharge: 2013-11-06 | Disposition: A | Discharge status: Home / Self Care | Payer: Medicare Other | Source: Ambulatory Visit | Attending: Neurosurgery | Admitting: Neurosurgery

## 2013-11-06 ENCOUNTER — Encounter (HOSPITAL_COMMUNITY): Payer: Self-pay

## 2013-11-06 DIAGNOSIS — Z01810 Encounter for preprocedural cardiovascular examination: Secondary | ICD-10-CM | POA: Insufficient documentation

## 2013-11-06 DIAGNOSIS — Z01812 Encounter for preprocedural laboratory examination: Secondary | ICD-10-CM | POA: Insufficient documentation

## 2013-11-06 DIAGNOSIS — Z01818 Encounter for other preprocedural examination: Secondary | ICD-10-CM | POA: Diagnosis not present

## 2013-11-06 DIAGNOSIS — M4712 Other spondylosis with myelopathy, cervical region: Secondary | ICD-10-CM | POA: Diagnosis not present

## 2013-11-06 DIAGNOSIS — Z87891 Personal history of nicotine dependence: Secondary | ICD-10-CM | POA: Diagnosis not present

## 2013-11-06 HISTORY — DX: Frequency of micturition: R35.0

## 2013-11-06 HISTORY — DX: Other seasonal allergic rhinitis: J30.2

## 2013-11-06 HISTORY — DX: Nausea with vomiting, unspecified: R11.2

## 2013-11-06 HISTORY — DX: Dermatitis, unspecified: L30.9

## 2013-11-06 HISTORY — DX: Other specified postprocedural states: Z98.890

## 2013-11-06 LAB — CBC
HCT: 36.5 % (ref 36.0–46.0)
Hemoglobin: 12.4 g/dL (ref 12.0–15.0)
MCH: 30.2 pg (ref 26.0–34.0)
MCHC: 34 g/dL (ref 30.0–36.0)
MCV: 88.8 fL (ref 78.0–100.0)
Platelets: 298 10*3/uL (ref 150–400)
RBC: 4.11 MIL/uL (ref 3.87–5.11)
RDW: 12.6 % (ref 11.5–15.5)
WBC: 4.3 10*3/uL (ref 4.0–10.5)

## 2013-11-06 LAB — BASIC METABOLIC PANEL
Anion gap: 11 (ref 5–15)
BUN: 13 mg/dL (ref 6–23)
CO2: 27 mEq/L (ref 19–32)
Calcium: 9.6 mg/dL (ref 8.4–10.5)
Chloride: 100 mEq/L (ref 96–112)
Creatinine, Ser: 0.73 mg/dL (ref 0.50–1.10)
GFR calc Af Amer: >90 mL/min (ref >90)
GFR calc non Af Amer: 83 mL/min — ABNORMAL LOW (ref >90)
Glucose, Bld: 86 mg/dL (ref 70–99)
Potassium: 4.3 mEq/L (ref 3.7–5.3)
Sodium: 138 mEq/L (ref 137–147)

## 2013-11-06 LAB — SURGICAL PCR SCREEN
MRSA, PCR: NEGATIVE
Staphylococcus aureus: POSITIVE — AB

## 2013-11-06 NOTE — Pre-Procedure Instructions (Signed)
April Church  11/06/2013   Your procedure is scheduled on:  Wednesday November 14, 2013 at 8:30 AM.   Report to [facility] Admitting at 6:30 AM.  Call this number if you have problems the morning of surgery: [phone]   Remember:   Do not eat food or drink liquids after midnight.   Take these medicines the morning of surgery with A SIP OF WATER: Anastrozole (Arimidex), Qvar inhaler, Flonase, Priimidone (Mysoline), Ranitidine (Zantac),  Venlafaxine (Effexor)   Discontinue aspirin and herbal medications 7 days before surgery   Do not wear jewelry, make-up or nail polish.  Do not wear lotions, powders, or perfumes.   Do not shave 48 hours prior to surgery.   Do not bring valuables to the hospital.  [facility] is not responsible for any belongings or valuables.               Contacts, dentures or bridgework may not be worn into surgery.  Leave suitcase in the car. After surgery it may be brought to your room.  For patients admitted to the hospital, discharge time is determined by your treatment team.               Patients discharged the day of surgery will not be allowed to drive home.  Name and phone number of your driver: Family/Friend  Special Instructions: Shower using CHG soap the night before and the morning of your surgery   Please read over the following fact sheets that you were given: Pain Booklet, Coughing and Deep Breathing and MRSA Information

## 2013-11-06 NOTE — Progress Notes (Signed)
Patient denied having any acute cardiac or pulmonary issues PCP is Development worker, community on [address].

## 2013-11-07 NOTE — Progress Notes (Signed)
Anesthesia Chart Review:  Pt is 73 year old female scheduled for C3-4, C4-5, C5-6, C6-7 anterior cervical decompression with fusion interbody prosthesis plating and bone graft on 11/14/13 with Dr. Arnoldo Morale.   PMH: HTN, hyperlipidemia, asthma, breast cancer  Medications include: ASA, losartan, beclomethasone, mysoline, arimidex  Preoperative labs reviewed.  Chest x-ray reviewed. No acute cardiopulmonary disease  EKG: NSR, anterior infarct (age undetermined).   Discussed with Dr. Tobias Alexander.   If no changes, I anticipate pt can proceed with surgery as scheduled.   Willeen Cass, FNP-BC [facility]/Anesthesiology Phone: [phone] 11/07/2013 4:26 PM

## 2013-11-10 ENCOUNTER — Other Ambulatory Visit: Payer: Self-pay | Admitting: Adult Health

## 2013-11-13 MED ORDER — CEFAZOLIN SODIUM-DEXTROSE 2-3 GM-% IV SOLR
2.0000 g | INTRAVENOUS | Status: AC
  Administered 2013-11-14 (×2): 2 g via INTRAVENOUS
  Filled 2013-11-13: qty 50

## 2013-11-14 ENCOUNTER — Inpatient Hospital Stay (HOSPITAL_COMMUNITY)
Admission: RE | Admit: 2013-11-14 | Discharge: 2013-11-16 | DRG: 473 | Disposition: A | Discharge status: Home Health | Payer: Medicare Other | Source: Ambulatory Visit | Attending: Neurosurgery | Admitting: Neurosurgery

## 2013-11-14 ENCOUNTER — Inpatient Hospital Stay (HOSPITAL_COMMUNITY): Payer: Medicare Other | Admitting: Anesthesiology

## 2013-11-14 ENCOUNTER — Encounter (HOSPITAL_COMMUNITY): Payer: Medicare Other | Admitting: Emergency Medicine

## 2013-11-14 ENCOUNTER — Encounter (HOSPITAL_COMMUNITY): Payer: Self-pay | Admitting: *Deleted

## 2013-11-14 ENCOUNTER — Encounter (HOSPITAL_COMMUNITY)
Admission: RE | Disposition: A | Discharge status: Home Health | Payer: Self-pay | Source: Ambulatory Visit | Attending: Neurosurgery

## 2013-11-14 ENCOUNTER — Inpatient Hospital Stay (HOSPITAL_COMMUNITY): Payer: Medicare Other

## 2013-11-14 DIAGNOSIS — Z7982 Long term (current) use of aspirin: Secondary | ICD-10-CM | POA: Diagnosis not present

## 2013-11-14 DIAGNOSIS — M4712 Other spondylosis with myelopathy, cervical region: Principal | ICD-10-CM | POA: Diagnosis present

## 2013-11-14 DIAGNOSIS — I10 Essential (primary) hypertension: Secondary | ICD-10-CM | POA: Diagnosis present

## 2013-11-14 DIAGNOSIS — Z79899 Other long term (current) drug therapy: Secondary | ICD-10-CM

## 2013-11-14 DIAGNOSIS — M4802 Spinal stenosis, cervical region: Secondary | ICD-10-CM | POA: Diagnosis not present

## 2013-11-14 DIAGNOSIS — K219 Gastro-esophageal reflux disease without esophagitis: Secondary | ICD-10-CM | POA: Diagnosis present

## 2013-11-14 DIAGNOSIS — J45909 Unspecified asthma, uncomplicated: Secondary | ICD-10-CM | POA: Diagnosis present

## 2013-11-14 DIAGNOSIS — M4722 Other spondylosis with radiculopathy, cervical region: Secondary | ICD-10-CM

## 2013-11-14 DIAGNOSIS — Z853 Personal history of malignant neoplasm of breast: Secondary | ICD-10-CM

## 2013-11-14 DIAGNOSIS — M5020 Other cervical disc displacement, unspecified cervical region: Secondary | ICD-10-CM | POA: Diagnosis not present

## 2013-11-14 DIAGNOSIS — M542 Cervicalgia: Secondary | ICD-10-CM | POA: Diagnosis not present

## 2013-11-14 DIAGNOSIS — M5412 Radiculopathy, cervical region: Secondary | ICD-10-CM | POA: Diagnosis not present

## 2013-11-14 DIAGNOSIS — M5090 Cervical disc disorder, unspecified, unspecified cervical region: Secondary | ICD-10-CM | POA: Diagnosis not present

## 2013-11-14 DIAGNOSIS — E785 Hyperlipidemia, unspecified: Secondary | ICD-10-CM | POA: Diagnosis not present

## 2013-11-14 DIAGNOSIS — Z9849 Cataract extraction status, unspecified eye: Secondary | ICD-10-CM | POA: Diagnosis not present

## 2013-11-14 HISTORY — PX: ANTERIOR CERVICAL DECOMPRESSION/DISCECTOMY FUSION 4 LEVELS: SHX5556

## 2013-11-14 LAB — POCT I-STAT 4, (NA,K, GLUC, HGB,HCT)
Glucose, Bld: 176 mg/dL — ABNORMAL HIGH (ref 70–99)
HCT: 28 % — ABNORMAL LOW (ref 36.0–46.0)
Hemoglobin: 9.5 g/dL — ABNORMAL LOW (ref 12.0–15.0)
Potassium: 4.1 mEq/L (ref 3.7–5.3)
Sodium: 134 mEq/L — ABNORMAL LOW (ref 137–147)

## 2013-11-14 SURGERY — ANTERIOR CERVICAL DECOMPRESSION/DISCECTOMY FUSION 4 LEVELS
Anesthesia: General | Site: Neck

## 2013-11-14 MED ORDER — HYDROCODONE-ACETAMINOPHEN 5-325 MG PO TABS: 1.0000 | ORAL_TABLET | ORAL | Status: DC | PRN

## 2013-11-14 MED ORDER — ARTIFICIAL TEARS OP OINT
TOPICAL_OINTMENT | OPHTHALMIC | Status: DC | PRN
  Administered 2013-11-14: 1 via OPHTHALMIC

## 2013-11-14 MED ORDER — PHENOL 1.4 % MT LIQD: 1.0000 | OROMUCOSAL | Status: DC | PRN

## 2013-11-14 MED ORDER — DIPHENHYDRAMINE HCL 25 MG PO TABS: 25.0000 mg | ORAL_TABLET | Freq: Every evening | ORAL | Status: DC | PRN

## 2013-11-14 MED ORDER — PROPOFOL 10 MG/ML IV BOLUS
INTRAVENOUS | Status: AC
  Filled 2013-11-14: qty 20

## 2013-11-14 MED ORDER — ANASTROZOLE 1 MG PO TABS: 1.0000 mg | ORAL_TABLET | Freq: Every day | ORAL | Status: DC

## 2013-11-14 MED ORDER — ACETAMINOPHEN 325 MG PO TABS
650.0000 mg | ORAL_TABLET | ORAL | Status: DC | PRN
Start: 2013-11-14 — End: 2013-11-16

## 2013-11-14 MED ORDER — LIDOCAINE HCL 4 % MT SOLN
OROMUCOSAL | Status: DC | PRN
  Administered 2013-11-14: 4 mL via TOPICAL

## 2013-11-14 MED ORDER — DEXAMETHASONE 4 MG PO TABS
4.0000 mg | ORAL_TABLET | Freq: Four times a day (QID) | ORAL | Status: AC
  Filled 2013-11-14 (×4): qty 1

## 2013-11-14 MED ORDER — VECURONIUM BROMIDE 10 MG IV SOLR
INTRAVENOUS | Status: DC | PRN
  Administered 2013-11-14 (×2): 2 mg via INTRAVENOUS
  Administered 2013-11-14: 3 mg via INTRAVENOUS
  Administered 2013-11-14 (×2): 2 mg via INTRAVENOUS
  Administered 2013-11-14: 3 mg via INTRAVENOUS

## 2013-11-14 MED ORDER — FLUTICASONE PROPIONATE 50 MCG/ACT NA SUSP
2.0000 | Freq: Every day | NASAL | Status: DC
  Administered 2013-11-16: 2 via NASAL
  Filled 2013-11-14 (×2): qty 16

## 2013-11-14 MED ORDER — GLYCOPYRROLATE 0.2 MG/ML IJ SOLN
INTRAMUSCULAR | Status: DC | PRN
  Administered 2013-11-14: .4 mg via INTRAVENOUS

## 2013-11-14 MED ORDER — VENLAFAXINE HCL 37.5 MG PO TABS: 37.5000 mg | ORAL_TABLET | Freq: Every day | ORAL | Status: DC

## 2013-11-14 MED ORDER — HYDROMORPHONE HCL 1 MG/ML IJ SOLN
INTRAMUSCULAR | Status: AC
  Filled 2013-11-14: qty 1

## 2013-11-14 MED ORDER — PROPOFOL 10 MG/ML IV BOLUS
INTRAVENOUS | Status: DC | PRN
  Administered 2013-11-14: 120 mg via INTRAVENOUS

## 2013-11-14 MED ORDER — ONDANSETRON HCL 4 MG/2ML IJ SOLN: 4.0000 mg | Freq: Once | INTRAMUSCULAR | Status: DC | PRN

## 2013-11-14 MED ORDER — DIPHENHYDRAMINE HCL 25 MG PO TABS
25.0000 mg | ORAL_TABLET | Freq: Every evening | ORAL | Status: DC | PRN
  Filled 2013-11-14: qty 1

## 2013-11-14 MED ORDER — ONDANSETRON HCL 4 MG/2ML IJ SOLN
INTRAMUSCULAR | Status: DC | PRN
  Administered 2013-11-14: 4 mg via INTRAVENOUS

## 2013-11-14 MED ORDER — VECURONIUM BROMIDE 10 MG IV SOLR
INTRAVENOUS | Status: AC
  Filled 2013-11-14: qty 10

## 2013-11-14 MED ORDER — MORPHINE SULFATE 2 MG/ML IJ SOLN
1.0000 mg | INTRAMUSCULAR | Status: DC | PRN
  Administered 2013-11-14 – 2013-11-15 (×3): 2 mg via INTRAVENOUS
  Administered 2013-11-15: 4 mg via INTRAVENOUS
  Administered 2013-11-15: 2 mg via INTRAVENOUS
  Filled 2013-11-14: qty 1
  Filled 2013-11-14: qty 2
  Filled 2013-11-14 (×3): qty 1

## 2013-11-14 MED ORDER — OXYCODONE-ACETAMINOPHEN 5-325 MG PO TABS
1.0000 | ORAL_TABLET | ORAL | Status: DC | PRN
  Administered 2013-11-15 (×2): 2 via ORAL
  Administered 2013-11-16: 1 via ORAL
  Administered 2013-11-16: 2 via ORAL
  Filled 2013-11-14: qty 2
  Filled 2013-11-14: qty 1
  Filled 2013-11-14 (×2): qty 2

## 2013-11-14 MED ORDER — MENTHOL 3 MG MT LOZG
1.0000 | LOZENGE | OROMUCOSAL | Status: DC | PRN
  Filled 2013-11-14: qty 9

## 2013-11-14 MED ORDER — LOSARTAN POTASSIUM 50 MG PO TABS
50.0000 mg | ORAL_TABLET | Freq: Every day | ORAL | Status: DC
  Administered 2013-11-14: 50 mg via ORAL
  Filled 2013-11-14 (×3): qty 1

## 2013-11-14 MED ORDER — ACETAMINOPHEN 650 MG RE SUPP: 650.0000 mg | RECTAL | Status: DC | PRN

## 2013-11-14 MED ORDER — FENTANYL CITRATE 0.05 MG/ML IJ SOLN
INTRAMUSCULAR | Status: DC | PRN
  Administered 2013-11-14 (×6): 50 ug via INTRAVENOUS
  Administered 2013-11-14: 100 ug via INTRAVENOUS
  Administered 2013-11-14 (×2): 50 ug via INTRAVENOUS

## 2013-11-14 MED ORDER — INFLUENZA VAC SPLIT QUAD 0.5 ML IM SUSY
0.5000 mL | PREFILLED_SYRINGE | INTRAMUSCULAR | Status: AC
  Administered 2013-11-16: 0.5 mL via INTRAMUSCULAR
  Filled 2013-11-14: qty 0.5

## 2013-11-14 MED ORDER — LACTATED RINGERS IV SOLN
INTRAVENOUS | Status: DC
  Administered 2013-11-14 – 2013-11-15 (×2): via INTRAVENOUS

## 2013-11-14 MED ORDER — NEOSTIGMINE METHYLSULFATE 10 MG/10ML IV SOLN
INTRAVENOUS | Status: DC | PRN
  Administered 2013-11-14: 3 mg via INTRAVENOUS

## 2013-11-14 MED ORDER — DOCUSATE SODIUM 100 MG PO CAPS
100.0000 mg | ORAL_CAPSULE | Freq: Two times a day (BID) | ORAL | Status: DC
  Administered 2013-11-14 – 2013-11-16 (×3): 100 mg via ORAL
  Filled 2013-11-14 (×3): qty 1

## 2013-11-14 MED ORDER — DEXAMETHASONE SODIUM PHOSPHATE 4 MG/ML IJ SOLN
4.0000 mg | Freq: Four times a day (QID) | INTRAMUSCULAR | Status: AC
  Administered 2013-11-14 – 2013-11-15 (×4): 4 mg via INTRAVENOUS
  Filled 2013-11-14 (×4): qty 1

## 2013-11-14 MED ORDER — BUPIVACAINE-EPINEPHRINE (PF) 0.5% -1:200000 IJ SOLN
INTRAMUSCULAR | Status: DC | PRN
  Administered 2013-11-14: 10 mL via PERINEURAL

## 2013-11-14 MED ORDER — FAMOTIDINE 20 MG PO TABS
20.0000 mg | ORAL_TABLET | Freq: Two times a day (BID) | ORAL | Status: DC
  Administered 2013-11-14 – 2013-11-16 (×3): 20 mg via ORAL
  Filled 2013-11-14 (×4): qty 1

## 2013-11-14 MED ORDER — FENTANYL CITRATE 0.05 MG/ML IJ SOLN
INTRAMUSCULAR | Status: AC
  Filled 2013-11-14: qty 5

## 2013-11-14 MED ORDER — EPHEDRINE SULFATE 50 MG/ML IJ SOLN
INTRAMUSCULAR | Status: DC | PRN
  Administered 2013-11-14 (×2): 10 mg via INTRAVENOUS

## 2013-11-14 MED ORDER — PHENYLEPHRINE HCL 10 MG/ML IJ SOLN
10.0000 mg | INTRAVENOUS | Status: DC | PRN
  Administered 2013-11-14: 15 ug/min via INTRAVENOUS

## 2013-11-14 MED ORDER — CEFAZOLIN SODIUM-DEXTROSE 2-3 GM-% IV SOLR
2.0000 g | Freq: Three times a day (TID) | INTRAVENOUS | Status: AC
  Administered 2013-11-15: 2 g via INTRAVENOUS
  Filled 2013-11-14 (×3): qty 50

## 2013-11-14 MED ORDER — ROCURONIUM BROMIDE 100 MG/10ML IV SOLN
INTRAVENOUS | Status: DC | PRN
  Administered 2013-11-14: 30 mg via INTRAVENOUS
  Administered 2013-11-14: 20 mg via INTRAVENOUS

## 2013-11-14 MED ORDER — HYDROMORPHONE HCL 1 MG/ML IJ SOLN
0.2500 mg | INTRAMUSCULAR | Status: DC | PRN
  Administered 2013-11-14 (×3): 0.5 mg via INTRAVENOUS
  Administered 2013-11-14: 0.25 mg via INTRAVENOUS

## 2013-11-14 MED ORDER — CEFAZOLIN SODIUM-DEXTROSE 2-3 GM-% IV SOLR
INTRAVENOUS | Status: AC
  Filled 2013-11-14: qty 50

## 2013-11-14 MED ORDER — DEXAMETHASONE SODIUM PHOSPHATE 10 MG/ML IJ SOLN
INTRAMUSCULAR | Status: DC | PRN
  Administered 2013-11-14: 10 mg via INTRAVENOUS

## 2013-11-14 MED ORDER — 0.9 % SODIUM CHLORIDE (POUR BTL) OPTIME
TOPICAL | Status: DC | PRN
  Administered 2013-11-14: 1000 mL

## 2013-11-14 MED ORDER — LIDOCAINE HCL (CARDIAC) 20 MG/ML IV SOLN
INTRAVENOUS | Status: DC | PRN
  Administered 2013-11-14: 100 mg via INTRAVENOUS

## 2013-11-14 MED ORDER — GLYCOPYRROLATE 0.2 MG/ML IJ SOLN
INTRAMUSCULAR | Status: AC
  Filled 2013-11-14: qty 2

## 2013-11-14 MED ORDER — DEXAMETHASONE SODIUM PHOSPHATE 10 MG/ML IJ SOLN
INTRAMUSCULAR | Status: AC
  Filled 2013-11-14: qty 1

## 2013-11-14 MED ORDER — MIDAZOLAM HCL 2 MG/2ML IJ SOLN
INTRAMUSCULAR | Status: AC
  Filled 2013-11-14: qty 2

## 2013-11-14 MED ORDER — VENLAFAXINE HCL 37.5 MG PO TABS
37.5000 mg | ORAL_TABLET | Freq: Every day | ORAL | Status: DC
  Administered 2013-11-16: 37.5 mg via ORAL
  Filled 2013-11-14 (×3): qty 1

## 2013-11-14 MED ORDER — PRIMIDONE 50 MG PO TABS
100.0000 mg | ORAL_TABLET | Freq: Three times a day (TID) | ORAL | Status: DC
  Administered 2013-11-14 – 2013-11-16 (×4): 100 mg via ORAL
  Filled 2013-11-14 (×8): qty 2

## 2013-11-14 MED ORDER — LACTATED RINGERS IV SOLN
INTRAVENOUS | Status: DC | PRN
  Administered 2013-11-14 (×3): via INTRAVENOUS

## 2013-11-14 MED ORDER — SURGIFOAM 100 EX MISC
CUTANEOUS | Status: DC | PRN
  Administered 2013-11-14 (×2): via TOPICAL

## 2013-11-14 MED ORDER — BACITRACIN ZINC 500 UNIT/GM EX OINT
TOPICAL_OINTMENT | CUTANEOUS | Status: DC | PRN
  Administered 2013-11-14: 1 via TOPICAL

## 2013-11-14 MED ORDER — SODIUM CHLORIDE 0.9 % IV SOLN
10.0000 mg | INTRAVENOUS | Status: DC | PRN
  Administered 2013-11-14: 100 ug/min via INTRAVENOUS

## 2013-11-14 MED ORDER — DIAZEPAM 5 MG PO TABS: 5.0000 mg | ORAL_TABLET | Freq: Four times a day (QID) | ORAL | Status: DC | PRN

## 2013-11-14 MED ORDER — FLUTICASONE PROPIONATE HFA 44 MCG/ACT IN AERO
1.0000 | INHALATION_SPRAY | Freq: Two times a day (BID) | RESPIRATORY_TRACT | Status: DC
  Administered 2013-11-14 – 2013-11-16 (×3): 1 via RESPIRATORY_TRACT
  Filled 2013-11-14: qty 10.6

## 2013-11-14 MED ORDER — ALUM & MAG HYDROXIDE-SIMETH 200-200-20 MG/5ML PO SUSP: 30.0000 mL | Freq: Four times a day (QID) | ORAL | Status: DC | PRN

## 2013-11-14 MED ORDER — BACITRACIN 50000 UNITS IM SOLR
INTRAMUSCULAR | Status: DC | PRN
  Administered 2013-11-14: 09:00:00

## 2013-11-14 MED ORDER — ONDANSETRON HCL 4 MG/2ML IJ SOLN
4.0000 mg | INTRAMUSCULAR | Status: DC | PRN
  Administered 2013-11-14 – 2013-11-15 (×4): 4 mg via INTRAVENOUS
  Filled 2013-11-14 (×4): qty 2

## 2013-11-14 MED ORDER — HYDROMORPHONE HCL 1 MG/ML IJ SOLN
INTRAMUSCULAR | Status: AC
  Administered 2013-11-14: 0.5 mg via INTRAVENOUS
  Filled 2013-11-14: qty 1

## 2013-11-14 MED ORDER — ANASTROZOLE 1 MG PO TABS
1.0000 mg | ORAL_TABLET | Freq: Every day | ORAL | Status: DC
  Administered 2013-11-16: 1 mg via ORAL
  Filled 2013-11-14 (×3): qty 1

## 2013-11-14 SURGICAL SUPPLY — 72 items
APL SKNCLS STERI-STRIP NONHPOA (GAUZE/BANDAGES/DRESSINGS) ×1
BAG DECANTER FOR FLEXI CONT (MISCELLANEOUS) ×3 IMPLANT
BENZOIN TINCTURE PRP APPL 2/3 (GAUZE/BANDAGES/DRESSINGS) ×4 IMPLANT
BIT DRILL NEURO 2X3.1 SFT TUCH (MISCELLANEOUS) ×1 IMPLANT
BLADE SURG 15 STRL LF DISP TIS (BLADE) ×1 IMPLANT
BLADE SURG 15 STRL SS (BLADE) ×3
BLADE ULTRA TIP 2M (BLADE) ×3 IMPLANT
BRUSH SCRUB EZ PLAIN DRY (MISCELLANEOUS) ×3 IMPLANT
BUR BARREL STRAIGHT FLUTE 4.0 (BURR) ×3 IMPLANT
BUR MATCHSTICK NEURO 3.0 LAGG (BURR) ×2 IMPLANT
CANISTER SUCT 3000ML (MISCELLANEOUS) ×3 IMPLANT
CLOSURE WOUND 1/2 X4 (GAUZE/BANDAGES/DRESSINGS) ×1
CONT SPEC 4OZ CLIKSEAL STRL BL (MISCELLANEOUS) ×3 IMPLANT
COVER MAYO STAND STRL (DRAPES) ×3 IMPLANT
DEVICE FUSION VIST S 14X14X5MM (Cage) IMPLANT
DEVICE FUSION VIST S 14X14X6MM (Trauma) IMPLANT
DRAIN JACKSON PRATT 10MM FLAT (MISCELLANEOUS) ×2 IMPLANT
DRAPE LAPAROTOMY 100X72 PEDS (DRAPES) ×3 IMPLANT
DRAPE MICROSCOPE LEICA (MISCELLANEOUS) IMPLANT
DRAPE POUCH INSTRU U-SHP 10X18 (DRAPES) ×3 IMPLANT
DRAPE SURG 17X23 STRL (DRAPES) ×6 IMPLANT
DRILL NEURO 2X3.1 SOFT TOUCH (MISCELLANEOUS) ×3
ELECT REM PT RETURN 9FT ADLT (ELECTROSURGICAL) ×3
ELECTRODE REM PT RTRN 9FT ADLT (ELECTROSURGICAL) ×1 IMPLANT
EVACUATOR SILICONE 100CC (DRAIN) ×2 IMPLANT
GAUZE SPONGE 4X4 12PLY STRL (GAUZE/BANDAGES/DRESSINGS) ×3 IMPLANT
GAUZE SPONGE 4X4 16PLY XRAY LF (GAUZE/BANDAGES/DRESSINGS) IMPLANT
GLOVE BIO SURGEON STRL SZ8.5 (GLOVE) ×3 IMPLANT
GLOVE BIOGEL PI IND STRL 7.0 (GLOVE) IMPLANT
GLOVE BIOGEL PI INDICATOR 7.0 (GLOVE) ×4
GLOVE ECLIPSE 6.5 STRL STRAW (GLOVE) ×2 IMPLANT
GLOVE ECLIPSE 7.0 STRL STRAW (GLOVE) ×6 IMPLANT
GLOVE EXAM NITRILE LRG STRL (GLOVE) IMPLANT
GLOVE EXAM NITRILE MD LF STRL (GLOVE) IMPLANT
GLOVE EXAM NITRILE XL STR (GLOVE) IMPLANT
GLOVE EXAM NITRILE XS STR PU (GLOVE) IMPLANT
GLOVE SS BIOGEL STRL SZ 8 (GLOVE) ×1 IMPLANT
GLOVE SS N UNI LF 7.0 STRL (GLOVE) ×6 IMPLANT
GLOVE SUPERSENSE BIOGEL SZ 8 (GLOVE) ×2
GOWN STRL REUS W/ TWL LRG LVL3 (GOWN DISPOSABLE) IMPLANT
GOWN STRL REUS W/ TWL XL LVL3 (GOWN DISPOSABLE) IMPLANT
GOWN STRL REUS W/TWL LRG LVL3 (GOWN DISPOSABLE) ×6
GOWN STRL REUS W/TWL XL LVL3 (GOWN DISPOSABLE) ×6
KIT BASIN OR (CUSTOM PROCEDURE TRAY) ×3 IMPLANT
KIT ROOM TURNOVER OR (KITS) ×3 IMPLANT
MARKER SKIN DUAL TIP RULER LAB (MISCELLANEOUS) ×3 IMPLANT
NDL SPNL 18GX3.5 QUINCKE PK (NEEDLE) ×1 IMPLANT
NEEDLE HYPO 22GX1.5 SAFETY (NEEDLE) ×3 IMPLANT
NEEDLE SPNL 18GX3.5 QUINCKE PK (NEEDLE) ×3 IMPLANT
NS IRRIG 1000ML POUR BTL (IV SOLUTION) ×3 IMPLANT
PACK LAMINECTOMY NEURO (CUSTOM PROCEDURE TRAY) ×3 IMPLANT
PATTIES SURGICAL .5 X.5 (GAUZE/BANDAGES/DRESSINGS) ×2 IMPLANT
PATTIES SURGICAL 1X1 (DISPOSABLE) ×2 IMPLANT
PIN DISTRACTION 14MM (PIN) ×6 IMPLANT
PLATE ANT CERV XTEND 4 LV 60 (Plate) ×2 IMPLANT
PUTTY BIOACTIVE 10CC KINEX (Putty) ×2 IMPLANT
RUBBERBAND STERILE (MISCELLANEOUS) IMPLANT
SCREW XTD VAR 4.2 SELF TAP 12 (Screw) ×20 IMPLANT
SPONGE INTESTINAL PEANUT (DISPOSABLE) ×6 IMPLANT
SPONGE SURGIFOAM ABS GEL 100 (HEMOSTASIS) ×3 IMPLANT
STRIP CLOSURE SKIN 1/2X4 (GAUZE/BANDAGES/DRESSINGS) ×2 IMPLANT
SUT VIC AB 0 CT1 27 (SUTURE) ×3
SUT VIC AB 0 CT1 27XBRD ANTBC (SUTURE) ×1 IMPLANT
SUT VIC AB 3-0 SH 8-18 (SUTURE) ×3 IMPLANT
SYR 20ML ECCENTRIC (SYRINGE) ×3 IMPLANT
TAPE CLOTH SURG 4X10 WHT LF (GAUZE/BANDAGES/DRESSINGS) ×2 IMPLANT
TAPE STRIPS DRAPE STRL (GAUZE/BANDAGES/DRESSINGS) ×2 IMPLANT
TOWEL OR 17X24 6PK STRL BLUE (TOWEL DISPOSABLE) ×3 IMPLANT
TOWEL OR 17X26 10 PK STRL BLUE (TOWEL DISPOSABLE) ×3 IMPLANT
VISTA S 14X14X5MM (Cage) ×6 IMPLANT
VISTA S O 14X14X6MM (Trauma) ×6 IMPLANT
WATER STERILE IRR 1000ML POUR (IV SOLUTION) ×3 IMPLANT

## 2013-11-14 NOTE — Anesthesia Procedure Notes (Signed)
Procedure Name: Intubation Date/Time: 11/14/2013 8:29 AM Performed by: Storm Frisk E Pre-anesthesia Checklist: Patient identified, Timeout performed, Emergency Drugs available, Suction available and Patient being monitored Patient Re-evaluated:Patient Re-evaluated prior to inductionOxygen Delivery Method: Circle system utilized Preoxygenation: Pre-oxygenation with 100% oxygen Intubation Type: IV induction and Cricoid Pressure applied Ventilation: Mask ventilation without difficulty Laryngoscope Size: Mac and 3 Grade View: Grade I Tube type: Oral Tube size: 7.0 mm Number of attempts: 2 Airway Equipment and Method: Stylet,  Bougie stylet and LTA kit utilized Placement Confirmation: ETT inserted through vocal cords under direct vision,  breath sounds checked- equal and bilateral and positive ETCO2 Secured at: 20 cm Tube secured with: Tape Dental Injury: Teeth and Oropharynx as per pre-operative assessment  Comments: DL x 1 with 7.5 Oral ETT resulted in recognized esophageal intubation d/t very small glottic opening.  DL x 1 with 7.0 Oral ETT and Bougie resulted in successful intubation.  + EtCO2, B/L breath sounds confirmed by Dr. Tamala Julian, and + chest rise.

## 2013-11-14 NOTE — Progress Notes (Signed)
Subjective:  The patient is somnolent but easily arousable. She looks well. She is in no apparent distress.  Objective: Vital signs in last 24 hours: Temp:  [97 F (36.1 C)-97.6 F (36.4 C)] 97.6 F (36.4 C) (09/23 1315) Pulse Rate:  [89-91] 89 (09/23 1315) Resp:  [18-25] 25 (09/23 1315) BP: (119-141)/(70-92) 119/70 mmHg (09/23 1315) SpO2:  [95 %-99 %] 95 % (09/23 1315) Weight:  [59.784 kg (131 lb 12.8 oz)] 59.784 kg (131 lb 12.8 oz) (09/23 0634)  Intake/Output from previous day:   Intake/Output this shift: Total I/O In: 2600 [I.V.:2600] Out: 1065 [Urine:615; Blood:450]  Physical exam the patient is somnolent but easily arousable. She is moving all 4 extremities well. Her deltoid strength is normal bilaterally. Her dressing is clean and dry. There is no evidence of hematoma or shift.  Lab Results: No results found for this basename: WBC, HGB, HCT, PLT,  in the last 72 hours BMET No results found for this basename: NA, K, CL, CO2, GLUCOSE, BUN, CREATININE, CALCIUM,  in the last 72 hours  Studies/Results: Dg Cervical Spine 2-3 Views  11/14/2013   CLINICAL DATA:  Anterior cervical disc fusion localization images  EXAM: CERVICAL SPINE - 2-3 VIEW  COMPARISON:  MRI, 08/29/2013  FINDINGS: The initial portable lateral view shows a surgical probe inserted from an anterior approach to have its tip superimposed over the anterior aspect of the C3-C4 disc. Followup image shows placement of a fusion plate and fixation screws and disc spacers from C3 through C6. Orthopedic hardware is well-seated. No acute fracture or evidence of an operative complication.  IMPRESSION: Anterior cervical disc fusion localization images as described.   Electronically Signed   By: Lajean Manes M.D.   On: 11/14/2013 12:57    Assessment/Plan: The patient is doing well.  LOS: 0 days     Einar Nolasco D 11/14/2013, 1:35 PM

## 2013-11-14 NOTE — Anesthesia Preprocedure Evaluation (Addendum)
Anesthesia Evaluation  Patient identified by MRN, date of birth, ID band Patient awake    Reviewed: Allergy & Precautions, H&P , NPO status , Patient's Chart, lab work & pertinent test results  History of Anesthesia Complications (+) PONV  Airway Mallampati: II TM Distance: >3 FB Neck ROM: Full  Mouth opening: Limited Mouth Opening  Dental  (+) Teeth Intact, Dental Advisory Given   Pulmonary asthma ,          Cardiovascular hypertension, Pt. on medications     Neuro/Psych essential tremor-She also has a vocal tremor.      GI/Hepatic GERD-  Medicated and Controlled,  Endo/Other    Renal/GU      Musculoskeletal  (+) Arthritis -,   Abdominal   Peds  Hematology   Anesthesia Other Findings Pt. C/o right shoulder pain in addition to RUE.  Reproductive/Obstetrics                         Anesthesia Physical Anesthesia Plan  ASA: II  Anesthesia Plan: General   Post-op Pain Management:    Induction: Intravenous  Airway Management Planned: Oral ETT  Additional Equipment:   Intra-op Plan:   Post-operative Plan: Extubation in OR  Informed Consent: I have reviewed the patients History and Physical, chart, labs and discussed the procedure including the risks, benefits and alternatives for the proposed anesthesia with the patient or authorized representative who has indicated his/her understanding and acceptance.     Plan Discussed with: CRNA, Anesthesiologist and Surgeon  Anesthesia Plan Comments:         Anesthesia Quick Evaluation

## 2013-11-14 NOTE — Op Note (Signed)
Brief history: The patient is a 73 year old white female who has had chronic neck and arm pain consistent with a cervical radiculopathy. She has failed medical management and was worked up with a cervical MRI. This demonstrated multilevel degenerative changes and stenosis. I discussed the various treatment options with the patient including surgery. She has weighed the risks, benefits, and alternatives surgery and decided proceed with a C3-4, C4-5, C5-6 and C6-7 anterior cervical discectomy, fusion, and plating.  Preoperative diagnosis: C3-4, C4-5, C5-6 and C6-7 disc degeneration, spondylosis, stenosis, cervicalgia, cervical radiculopathy, cervical myelopathy  Postoperative diagnosis: The same  Procedure: C3-4, C4-5, C5-6 and C6-7 Anterior cervical discectomy/decompression; C3-4, C4-5, C5-6 and C6-7 interbody arthrodesis with local morcellized autograft bone and Actifuse bone graft extender; insertion of interbody prosthesis at C3-4, C4-5, C5-6 and C6-7 (Zimmer peek interbody prosthesis); anterior cervical plating from C3-C7 with globus titanium plate  Surgeon: Dr. Earle Gell  Asst.: None  Anesthesia: Gen. endotracheal  Estimated blood loss: 250 cc  Drains: One 10 mm flat Jackson-Pratt drain in the prevertebral space.  Complications: None  Description of procedure: The patient was brought to the operating room by the anesthesia team. General endotracheal anesthesia was induced. A roll was placed under the patient's shoulders to keep the neck in the neutral position. The patient's anterior cervical region was then prepared with Betadine scrub and Betadine solution. Sterile drapes were applied.  The area to be incised was then injected with Marcaine with epinephrine solution. I then used a scalpel to make a transverse incision in the patient's left anterior neck. I used the Metzenbaum scissors to divide the platysmal muscle and then to dissect medial to the sternocleidomastoid muscle, jugular  vein, and carotid artery. I carefully dissected down towards the anterior cervical spine identifying the esophagus and retracting it medially. Then using Kitner swabs to clear soft tissue from the anterior cervical spine. We then inserted a bent spinal needle into the upper exposed intervertebral disc space. We then obtained intraoperative radiographs confirm our location.  I then used electrocautery to detach the medial border of the longus colli muscle bilaterally from the C3-4, C4-5, C5-6 and C6-7 intervertebral disc spaces. I then inserted the Caspar self-retaining retractor underneath the longus colli muscle bilaterally to provide exposure.  We then incised the intervertebral disc at C3-4. We then performed a partial intervertebral discectomy with a pituitary forceps and the Karlin curettes. I then inserted distraction screws into the vertebral bodies at C3-4. We then distracted the interspace. We then used the high-speed drill to decorticate the vertebral endplates at T4-1, to drill away the remainder of the intervertebral disc, to drill away some posterior spondylosis, and to thin out the posterior longitudinal ligament. I then incised ligament with the arachnoid knife. We then removed the ligament with a Kerrison punches undercutting the vertebral endplates and decompressing the thecal sac. We then performed foraminotomies about the bilateral C4 nerve roots. This completed the decompression at this level.  We then repeated this procedure in an analogous fashion at C4-5, C5-6 and C6-7 decompressing the thecal sac at these levels as well as the bilateral C5, C6 and C7 nerve roots.  We now turned our to attention to the interbody fusion. We used the trial spacers to determine the appropriate size for the interbody prosthesis. We then pre-filled prosthesis with a combination of local morcellized autograft bone that we obtained during decompression as well as Actifuse bone graft extender. We then inserted  the prosthesis into the distracted interspace at C3-4, C4-5,  C5-6 and C6-7. We then removed the distraction screws. There was a good snug fit of the prosthesis in the interspace.  Having completed the fusion we now turned attention to the anterior spinal instrumentation. We used the high-speed drill to drill away some anterior spondylosis at the disc spaces so that the plate lay down flat. We selected the appropriate length titanium anterior cervical plate. We laid it along the anterior aspect of the vertebral bodies from C3-C7. We then drilled 12 mm holes at C3, C4, C5, C6 and C7. We then secured the plate to the vertebral bodies by placing two 12 mm self-tapping screws at C3, C4, C5, C6 and C7. We then obtained intraoperative radiograph. The demonstrating good position of the instrumentation. We therefore secured the screws the plate the locking each cam. This completed the instrumentation.  We then obtained hemostasis using bipolar electrocautery. We irrigated the wound out with bacitracin solution. We then removed the retractor. We inspected the esophagus for any damage. There was none apparent. I placed a 10 mm flat Jackson-Pratt drain in the prevertebral space. I tunneled it out through a separate stab wound. We then reapproximated patient's platysmal muscle with interrupted 3-0 Vicryl suture. We then reapproximated the subcutaneous tissue with interrupted 3-0 Vicryl suture. The skin was reapproximated with Steri-Strips and benzoin. The wound was then covered with bacitracin ointment. A sterile dressing was applied. The drapes were removed. Patient was subsequently extubated by the anesthesia team and transported to the post anesthesia care unit in stable condition. All sponge instrument and needle counts were reportedly correct at the end of this case.

## 2013-11-14 NOTE — Progress Notes (Signed)
Pt admitted from neuro PACU - alert, ox3, VSS, oriented to unit, pain management and safety discussed, pt verbalizes understanding - F/C placed prior to arrival - family at bedside

## 2013-11-14 NOTE — Anesthesia Postprocedure Evaluation (Signed)
  Anesthesia Post-op Note  Patient: April Church  Procedure(s) Performed: Procedure(s) with comments: ANTERIOR CERVICAL DECOMPRESSION/DISCECTOMY FUSION 4 LEVELS (N/A) - C34 C45 C56 C67 anterior cervical fusion with interbody prosthesis plating and  bonegraft  Patient Location: PACU  Anesthesia Type:General  Level of Consciousness: awake, oriented, sedated and patient cooperative  Airway and Oxygen Therapy: Patient Spontanous Breathing  Post-op Pain: mild  Post-op Assessment: Post-op Vital signs reviewed, Patient's Cardiovascular Status Stable, Respiratory Function Stable, Patent Airway and No signs of Nausea or vomiting  Post-op Vital Signs: stable  Last Vitals:  Filed Vitals:   11/14/13 1430  BP: 129/79  Pulse: 111  Temp:   Resp: 16    Complications: No apparent anesthesia complications

## 2013-11-14 NOTE — Transfer of Care (Signed)
Immediate Anesthesia Transfer of Care Note  Patient: April Church  Procedure(s) Performed: Procedure(s) with comments: ANTERIOR CERVICAL DECOMPRESSION/DISCECTOMY FUSION 4 LEVELS (N/A) - C34 C45 C56 C67 anterior cervical fusion with interbody prosthesis plating and  bonegraft  Patient Location: PACU  Anesthesia Type:General  Level of Consciousness: awake, lethargic and responds to stimulation  Airway & Oxygen Therapy: Patient Spontanous Breathing and Patient connected to nasal cannula oxygen  Post-op Assessment: Report given to PACU RN and Post -op Vital signs reviewed and stable  Post vital signs: Reviewed and stable  Complications: No apparent anesthesia complications

## 2013-11-14 NOTE — Progress Notes (Signed)
Utilization review completed.  

## 2013-11-14 NOTE — H&P (Signed)
Subjective: The patient is a 72 year old white female is complaining of neck arm pain consistent with a cervical radiculopathy. She has failed medical management and was worked up with a cervical MRI. This demonstrated multilevel degenerative changes and spondylosis most prominent at C3-4, C4-5, C5-6 and C6-7. I discussed the various treatment options with the patient including surgery. The patient has weighed the risks, benefits, and alternatives surgery and decided proceed with a 4 level anterior cervical discectomy fusion and plating.   Past Medical History  Diagnosis Date  . Tremors of nervous system     hands  . GERD (gastroesophageal reflux disease)   . 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 Cyst of finger 11/2011    annular cyst right long finger  . Hypertension     under control, has been on med. x 2 yrs.  . Dental crowns present   . Asthma     daily inhaler  . Hyperlipidemia   . PONV (postoperative nausea and vomiting)   . Frequency of urination   . Breast cancer     left  . Seasonal allergies   . Dermatitis     Past Surgical History  Procedure Laterality Date  . Nasal sinus surgery      x 2  . Foot surgery  06/22/11    left  . Knee arthroscopy  03/12/2005    right  . Nephrectomy living donor Left 04/2008  . Appendectomy  age 65  . Tumor excision  age 69    right arm  . Cataract extraction, bilateral    . Breast surgery  1999    reduction  . Breast lumpectomy  06/16/2009    left; SLN bx.  . Breast lumpectomy  07/01/2009    re-excision  . Knee arthroscopy      left  . Trigger finger release  12/16/2011    Procedure: RELEASE TRIGGER FINGER/A-1 PULLEY;  Surgeon: Tennis Must, MD;  Location: [facility];  Service: Orthopedics;  Laterality: Right;  RIGHT LONG FINGER TRIGGER RELEASE & ANNULAR CYST EXCISION    Allergies  Allergen Reactions  . Shellfish Allergy Shortness Of Breath  . Sulfa Drugs Cross Reactors Anxiety and Rash  .  Codeine Anxiety    History  Substance Use Topics  . Smoking status: Never Smoker   . Smokeless tobacco: Never Used  . Alcohol Use: No    Family History  Problem Relation Age of Onset  . Kidney disease Mother   . Stroke Father   . Diabetes Brother   . Heart disease Brother   . Cancer Sister    Prior to Admission medications   Medication Sig Start Date End Date Taking? Authorizing Provider  anastrozole (ARIMIDEX) 1 MG tablet Take 1 mg by mouth daily.   Yes Historical Provider, MD  anastrozole (ARIMIDEX) 1 MG tablet TAKE ONE TABLET BY MOUTH ONCE DAILY 11/12/13  Yes Minette Headland, NP  aspirin 81 MG tablet Take 81 mg by mouth daily.     Yes Historical Provider, MD  beclomethasone (QVAR) 80 MCG/ACT inhaler Inhale 1 puff into the lungs 2 (two) times daily.    Yes Historical Provider, MD  CALCIUM CARBONATE PO Take 1 tablet by mouth 2 (two) times daily.   Yes Historical Provider, MD  Cholecalciferol (VITAMIN D) 1000 UNITS capsule Take 1,000 Units by mouth 2 (two) times daily.    Yes Historical Provider, MD  diphenhydrAMINE (BENADRYL) 25 MG tablet Take 25 mg  by mouth at bedtime as needed for itching.   Yes Historical Provider, MD  fluticasone (FLONASE) 50 MCG/ACT nasal spray Place 2 sprays into the nose daily.     Yes Historical Provider, MD  losartan (COZAAR) 50 MG tablet Take 50 mg by mouth daily.   Yes Historical Provider, MD  Omega-3 Fatty Acids (FISH OIL) 1200 MG CAPS Take 1,200 mg by mouth daily.    Yes Historical Provider, MD  primidone (MYSOLINE) 50 MG tablet Take 2 tablets (100 mg total) by mouth 3 (three) times daily. 06/13/13  Yes Dennie Bible, NP  ranitidine (ZANTAC) 150 MG capsule Take 150 mg by mouth daily after breakfast.    Yes Historical Provider, MD  triamcinolone cream (KENALOG) 0.1 % Apply 1 application topically 2 (two) times daily.   Yes Historical Provider, MD  venlafaxine (EFFEXOR) 37.5 MG tablet Take 37.5 mg by mouth daily.   Yes Historical Provider, MD   venlafaxine (EFFEXOR) 37.5 MG tablet TAKE ONE TABLET BY MOUTH ONCE DAILY 11/12/13  Yes Minette Headland, NP     Review of Systems  Positive ROS: As above  All other systems have been reviewed and were otherwise negative with the exception of those mentioned in the HPI and as above.  Objective: Vital signs in last 24 hours: Temp:  [97 F (36.1 C)] 97 F (36.1 C) (09/23 0654) Pulse Rate:  [91] 91 (09/23 0654) Resp:  [18] 18 (09/23 0654) BP: (141)/(92) 141/92 mmHg (09/23 0654) SpO2:  [99 %] 99 % (09/23 0654) Weight:  [59.784 kg (131 lb 12.8 oz)] 59.784 kg (131 lb 12.8 oz) (09/23 0634)  General Appearance: Alert, cooperative, no distress, Head: Normocephalic, without obvious abnormality, atraumatic Eyes: PERRL, conjunctiva/corneas clear, EOM's intact,    Ears: Normal  Throat: Normal  Neck: Supple, symmetrical, trachea midline, no adenopathy; thyroid: No enlargement/tenderness/nodules; no carotid bruit or JVD Back: Symmetric, no curvature, ROM normal, no CVA tenderness Lungs: Clear to auscultation bilaterally, respirations unlabored Heart: Regular rate and rhythm, no murmur, rub or gallop Abdomen: Soft, non-tender,, no masses, no organomegaly Extremities: Extremities normal, atraumatic, no cyanosis or edema Pulses: 2+ and symmetric all extremities Skin: Skin color, texture, turgor normal, no rashes or lesions  NEUROLOGIC:   Mental status: alert and oriented, no aphasia, good attention span, Fund of knowledge/ memory ok Motor Exam - grossly normal Sensory Exam - grossly normal Reflexes:  Coordination - grossly normal Gait - grossly normal Balance - grossly normal Cranial Nerves: I: smell Not tested  II: visual acuity  OS: Normal  OD: Normal   II: visual fields Full to confrontation  II: pupils Equal, round, reactive to light  III,VII: ptosis None  III,IV,VI: extraocular muscles  Full ROM  V: mastication Normal  V: facial light touch sensation  Normal  V,VII: corneal  reflex  Present  VII: facial muscle function - upper  Normal  VII: facial muscle function - lower Normal  VIII: hearing Not tested  IX: soft palate elevation  Normal  IX,X: gag reflex Present  XI: trapezius strength  5/5  XI: sternocleidomastoid strength 5/5  XI: neck flexion strength  5/5  XII: tongue strength  Normal    Data Review Lab Results  Component Value Date   WBC 4.3 11/06/2013   HGB 12.4 11/06/2013   HCT 36.5 11/06/2013   MCV 88.8 11/06/2013   PLT 298 11/06/2013   Lab Results  Component Value Date   NA 138 11/06/2013   K 4.3 11/06/2013   CL 100  11/06/2013   CO2 27 11/06/2013   BUN 13 11/06/2013   CREATININE 0.73 11/06/2013   GLUCOSE 86 11/06/2013   No results found for this basename: INR, PROTIME    Assessment/Plan: C3-4, C4-5, C5-6 and C6-7 disc degeneration, spondylosis, stenosis, cervical radiculopathy, cervical myelopathy: I discussed the situation with the patient. I have reviewed her MRI with her and pointed out the abnormalities. We have discussed the various treatment options including surgery. I have described the surgical option of a C3-4, C4-5, C5-6 and C6-7 anterior cervical discectomy, fusion, and plating. I've shown her surgical models. We have discussed the risks, benefits, alternatives, and likelihood of achieving our goals with surgery. I have answered all the patient's questions. She has decided proceed with surgery.   Manraj Yeo D 11/14/2013 8:07 AM

## 2013-11-15 MED ORDER — PROMETHAZINE HCL 25 MG/ML IJ SOLN
12.5000 mg | Freq: Four times a day (QID) | INTRAMUSCULAR | Status: DC | PRN
  Administered 2013-11-15: 12.5 mg via INTRAVENOUS
  Filled 2013-11-15: qty 1

## 2013-11-15 NOTE — Progress Notes (Signed)
Patient ID: April Church, female   DOB: 22-May-1940, 73 y.o.   MRN: [id number] Subjective:  The patient is alert and pleasant. She is appropriately sore.  Objective: Vital signs in last 24 hours: Temp:  [97.4 F (36.3 C)-98.9 F (37.2 C)] 97.4 F (36.3 C) (09/24 0549) Pulse Rate:  [89-120] 98 (09/24 0549) Resp:  [10-25] 10 (09/24 0549) BP: (107-139)/(62-88) 109/62 mmHg (09/24 0549) SpO2:  [95 %-100 %] 98 % (09/24 0549)  Intake/Output from previous day: 09/23 0701 - 09/24 0700 In: 4125 [I.V.:4125] Out: 1840 [Urine:1365; Drains:25; Blood:450] Intake/Output this shift:    Physical exam the patient is alert and oriented x3. Her strength is normal in her bilateral upper extremities. Her dressing is clean and dry. There is no evidence of hematoma or shift. I removed the drain.  Lab Results:  Recent Labs  11/14/13 1309  HGB 9.5*  HCT 28.0*   BMET  Recent Labs  11/14/13 1309  NA 134*  K 4.1  GLUCOSE 176*    Studies/Results: Dg Cervical Spine 2-3 Views  11/14/2013   CLINICAL DATA:  Anterior cervical disc fusion localization images  EXAM: CERVICAL SPINE - 2-3 VIEW  COMPARISON:  MRI, 08/29/2013  FINDINGS: The initial portable lateral view shows a surgical probe inserted from an anterior approach to have its tip superimposed over the anterior aspect of the C3-C4 disc. Followup image shows placement of a fusion plate and fixation screws and disc spacers from C3 through C6. Orthopedic hardware is well-seated. No acute fracture or evidence of an operative complication.  IMPRESSION: Anterior cervical disc fusion localization images as described.   Electronically Signed   By: Lajean Manes M.D.   On: 11/14/2013 12:57    Assessment/Plan: Postop day 1: The patient is doing well. We will mobilize her. We will transfer her to  4 N.  LOS: 1 day     Shaila Gilchrest D 11/15/2013, 7:44 AM

## 2013-11-15 NOTE — Progress Notes (Signed)
PT Cancellation Note  Patient Details Name: April Church MRN: [id number] DOB: 06-01-1940   Cancelled Treatment:    Reason Eval/Treat Not Completed: Medical issues which prohibited therapy (Pt with nausea/vomitting. Will check back)   [name] 11/15/2013, 10:19 AM Levonne Hubert, SPT

## 2013-11-15 NOTE — Care Management Note (Signed)
    Page 1 of 1   11/15/2013     10:38:10 AM CARE MANAGEMENT NOTE 11/15/2013  Patient:  April Church, April Church   Account Number:  [id number]  Date Initiated:  11/15/2013  Documentation initiated by:  Marvetta Gibbons  Subjective/Objective Assessment:   Pt admitted s/p ANTERIOR CERVICAL DECOMPRESSION/DISCECTOMY FUSION 4 LEVELS     Action/Plan:   PTA pt lived at home with spouse- PT eval pending- will f/u for recommendations   Anticipated DC Date:  11/17/2013   Anticipated DC Plan:        DC Planning Services  CM consult      Choice offered to / List presented to:             Status of service:  In process, will continue to follow Medicare Important Message given?   (If response is "NO", the following Medicare IM given date fields will be blank) Date Medicare IM given:   Medicare IM given by:   Date Additional Medicare IM given:   Additional Medicare IM given by:    Discharge Disposition:    Per UR Regulation:  Reviewed for med. necessity/level of care/duration of stay  If discussed at [place] of Stay Meetings, dates discussed:    Comments:

## 2013-11-15 NOTE — Evaluation (Signed)
Physical Therapy Evaluation Patient Details Name: April Church MRN: [id number] DOB: 1940/09/05 Today's Date: 11/15/2013   History of Present Illness  Pt admitted s/p ANTERIOR CERVICAL DECOMPRESSION/DISCECTOMY FUSION 4 LEVELS   Clinical Impression  Patient demonstrates deficits in functional mobility as indicated below. Will benefit from continued skilled PT to address deficits and maximize function. Will see as indicated and progress as tolerated.    Follow Up Recommendations Home health PT;Supervision/Assistance - 24 hour    Equipment Recommendations  Rolling walker with 5" wheels (may not need pending progress)    Recommendations for Other Services       Precautions / Restrictions Precautions Precautions: Fall Required Braces or Orthoses: Cervical Brace Cervical Brace: Hard collar      Mobility  Bed Mobility Overal bed mobility: Needs Assistance Bed Mobility: Rolling;Sidelying to Sit Rolling: Min assist Sidelying to sit: Min assist       General bed mobility comments: VCs for technique and sequencing  Transfers Overall transfer level: Needs assistance Equipment used: None Transfers: Sit to/from Stand Sit to Stand: Min guard         General transfer comment: no physical assist required, guard for stability  Ambulation/Gait Ambulation/Gait assistance: Min guard Ambulation Distance (Feet): 110 Feet Assistive device: 1 person hand held assist Gait Pattern/deviations: Step-through pattern;Decreased stride length;Shuffle Gait velocity: decreased Gait velocity interpretation: Below normal speed for age/gender General Gait Details: Max cues for increased cadence, min gaurd for stability, assist for static standing as patient became nauseated during ambulation and vommited x4.  Stairs            Wheelchair Mobility    Modified Rankin (Stroke Patients Only)       Balance Overall balance assessment: Needs assistance   Sitting balance-Leahy  Scale: Good       Standing balance-Leahy Scale: Fair                               Pertinent Vitals/Pain Pain Assessment: No/denies pain    Home Living Family/patient expects to be discharged to:: Private residence Living Arrangements: Spouse/significant other Available Help at Discharge: Family Type of Home: House (condo) Home Access: Stairs to enter   Technical brewer of Steps: 2 Home Layout: One level Home Equipment: Josephine - single point      Prior Function Level of Independence: Independent               Hand Dominance   Dominant Hand: Right    Extremity/Trunk Assessment   Upper Extremity Assessment: Defer to OT evaluation           Lower Extremity Assessment: Generalized weakness      Cervical / Trunk Assessment: Kyphotic  Communication   Communication: No difficulties  Cognition Arousal/Alertness: Awake/alert Behavior During Therapy: WFL for tasks assessed/performed Overall Cognitive Status: Within Functional Limits for tasks assessed                      General Comments      Exercises        Assessment/Plan    PT Assessment Patient needs continued PT services  PT Diagnosis Difficulty walking;Abnormality of gait;Generalized weakness   PT Problem List Decreased strength;Decreased range of motion;Decreased activity tolerance;Decreased balance;Decreased mobility;Pain  PT Treatment Interventions DME instruction;Gait training;Functional mobility training;Stair training;Therapeutic activities;Therapeutic exercise;Balance training;Patient/family education   PT Goals (Current goals can be found in the Care Plan section) Acute Rehab PT Goals Patient Stated  Goal: to go home PT Goal Formulation: With patient/family Time For Goal Achievement: 11/29/13 Potential to Achieve Goals: Good    Frequency Min 4X/week   Barriers to discharge        Co-evaluation               End of Session Equipment Utilized  During Treatment: Gait belt Activity Tolerance: Patient tolerated treatment well (limited by nausea and vommiting) Patient left: in chair;with call bell/phone within reach;with family/visitor present Nurse Communication: Mobility status         Time: 1203-1230 PT Time Calculation (min): 27 min   Charges:   PT Evaluation $Initial PT Evaluation Tier I: 1 Procedure PT Treatments $Gait Training: 8-22 mins $Therapeutic Activity: 8-22 mins   PT G CodesDuncan Dull 11/15/2013, 5:04 PM  Alben Deeds, [place] DPT  [phone]

## 2013-11-16 ENCOUNTER — Encounter (HOSPITAL_COMMUNITY): Payer: Self-pay | Admitting: Neurosurgery

## 2013-11-16 DIAGNOSIS — M4712 Other spondylosis with myelopathy, cervical region: Secondary | ICD-10-CM | POA: Diagnosis not present

## 2013-11-16 MED ORDER — DSS 100 MG PO CAPS: 100.0000 mg | ORAL_CAPSULE | Freq: Two times a day (BID) | ORAL | Status: DC

## 2013-11-16 MED ORDER — OXYCODONE-ACETAMINOPHEN 5-325 MG PO TABS: 1.0000 | ORAL_TABLET | ORAL | Status: DC | PRN

## 2013-11-16 MED ORDER — DIAZEPAM 5 MG PO TABS: 5.0000 mg | ORAL_TABLET | Freq: Four times a day (QID) | ORAL | Status: DC | PRN

## 2013-11-16 NOTE — Discharge Summary (Signed)
Physician Discharge Summary  Patient ID: April Church MRN: [id number] DOB/AGE: 1940-03-08 73 y.o.  Admit date: 11/14/2013 Discharge date: 11/16/2013  Admission Diagnoses: C3-4, C4-5, C5-6 and C6-7 disc degeneration, spondylosis, stenosis, cervicalgia, cervical radiculopathy, cervical myelopathy  Discharge Diagnoses: The same Active Problems:   Cervical spondylosis with myelopathy and radiculopathy   Discharged Condition: good  Hospital Course: I performed a C3-4, C4-5, C5-6 and C6-7 anterior cervical discectomy, fusion, and plating on the patient on 11/14/2013. The surgery went well.  The patient's postoperative course was remarkable for some nausea and vomiting. This was treated with medications and resolved.  On postoperative day #2 the patient felt better and wanted to go home. She was given oral and written discharge instructions. All her questions were answered.  Consults: None Significant Diagnostic Studies: None Treatments: C3-4, C4-5, C5-6 and C6-7 anterior cervical discectomy, fusion, and plating. Discharge Exam: Blood pressure 148/84, pulse 95, temperature 97.8 F (36.6 C), temperature source Oral, resp. rate 18, height 5\' 1"  (1.549 m), weight 60.2 kg (132 lb 11.5 oz), SpO2 95.00%. The patient is alert and pleasant. She looks well. Her dressing has a bloodstained. There is no evidence of hematoma or shift. Her strength is normal. She is swallowing well  Disposition: Home     Medication List         anastrozole 1 MG tablet  Commonly known as:  ARIMIDEX  Take 1 mg by mouth daily.     anastrozole 1 MG tablet  Commonly known as:  ARIMIDEX  TAKE ONE TABLET BY MOUTH ONCE DAILY     aspirin 81 MG tablet  Take 81 mg by mouth daily.     beclomethasone 80 MCG/ACT inhaler  Commonly known as:  QVAR  Inhale 1 puff into the lungs 2 (two) times daily.     CALCIUM CARBONATE PO  Take 1 tablet by mouth 2 (two) times daily.     diazepam 5 MG tablet  Commonly  known as:  VALIUM  Take 1 tablet (5 mg total) by mouth every 6 (six) hours as needed for muscle spasms.     diphenhydrAMINE 25 MG tablet  Commonly known as:  BENADRYL  Take 25 mg by mouth at bedtime as needed for itching.     DSS 100 MG Caps  Take 100 mg by mouth 2 (two) times daily.     Fish Oil 1200 MG Caps  Take 1,200 mg by mouth daily.     FLONASE 50 MCG/ACT nasal spray  Generic drug:  fluticasone  Place 2 sprays into the nose daily.     losartan 50 MG tablet  Commonly known as:  COZAAR  Take 50 mg by mouth daily.     oxyCODONE-acetaminophen 5-325 MG per tablet  Commonly known as:  PERCOCET/ROXICET  Take 1-2 tablets by mouth every 4 (four) hours as needed for moderate pain.     primidone 50 MG tablet  Commonly known as:  MYSOLINE  Take 2 tablets (100 mg total) by mouth 3 (three) times daily.     ranitidine 150 MG capsule  Commonly known as:  ZANTAC  Take 150 mg by mouth daily after breakfast.     triamcinolone cream 0.1 %  Commonly known as:  KENALOG  Apply 1 application topically 2 (two) times daily.     venlafaxine 37.5 MG tablet  Commonly known as:  EFFEXOR  Take 37.5 mg by mouth daily.     venlafaxine 37.5 MG tablet  Commonly known as:  EFFEXOR  TAKE ONE TABLET BY MOUTH ONCE DAILY     Vitamin D 1000 UNITS capsule  Take 1,000 Units by mouth 2 (two) times daily.         SignedOphelia Church 11/16/2013, 7:58 AM

## 2013-11-16 NOTE — Progress Notes (Signed)
Discharge orders received.  Discharge instructions and follow-up appointments reviewed with the patient and her husband.  IV removed, neck dressing changed and education complete.  VSS upon discharge.  Transported out via wheelchair, husband present.  Cori Razor, RN 11/16/2013

## 2013-11-16 NOTE — Progress Notes (Signed)
Talked to patient about D[facility]/ [place] choices, patient chose [facility] for HHPT; [facility] with [facility] called for arrangements; patient does not want a rolling walker, she stated that she already have one at home; Aneta Mins [phone]

## 2013-11-16 NOTE — Progress Notes (Signed)
Pt transfered from 3S to unit s/p AC DF on 11/13/13 Pt alert awake  And oriented x 4 ,follows command .  Oriented to room and use of call light. Pt denied any c/o of pain or N/vomitting at present.  [place]  Collar in use. anterior neck dressing dry with old stain.[name] RN will continue to monitor.

## 2013-11-16 NOTE — Progress Notes (Signed)
UR complete.  Elany Felix RN, MSN 

## 2013-11-16 NOTE — Progress Notes (Signed)
Physical Therapy Treatment Patient Details Name: April Church MRN: [id number] DOB: 1940-11-09 Today's Date: 11/16/2013    History of Present Illness Pt admitted s/p ANTERIOR CERVICAL DECOMPRESSION/DISCECTOMY FUSION 4 LEVELS     PT Comments    Pt progressing well with mobility. Able to manage steps at supervision level for safety. Cervical collar readjusted and [employer] called regarding extra pads for comfort. Pt safe to D/C home from mobility standpoint.   Follow Up Recommendations  Home health PT;Supervision/Assistance - 24 hour     Equipment Recommendations  Rolling walker with 5" wheels    Recommendations for Other Services       Precautions / Restrictions Precautions Precautions: Cervical Precaution Comments: pt given handout on cervical precautions and reviewed with pt and family Required Braces or Orthoses: Cervical Brace Cervical Brace: Hard collar Restrictions Weight Bearing Restrictions: No    Mobility  Bed Mobility Overal bed mobility: Needs Assistance Bed Mobility: Rolling;Sidelying to Sit;Sit to Sidelying Rolling: Supervision Sidelying to sit: Supervision     Sit to sidelying: Supervision General bed mobility comments: cues for log rolling technique to adhere to cervical precautions   Transfers Overall transfer level: Needs assistance Equipment used: None Transfers: Sit to/from Stand Sit to Stand: Supervision         General transfer comment: no LOB or sway noted; supervision for safety only   Ambulation/Gait Ambulation/Gait assistance: Supervision Ambulation Distance (Feet): 220 Feet Assistive device: None Gait Pattern/deviations: Step-through pattern;Decreased stride length;Narrow base of support Gait velocity: guarded  Gait velocity interpretation: Below normal speed for age/gender General Gait Details: no LOB noted with mobility; pt was guarded with gt but able to progress well without use of AD   Stairs Stairs: Yes Stairs  assistance: Supervision Stair Management: One rail Right;Step to pattern;Forwards Number of Stairs: 3 General stair comments: supervision for cues for safe technique; pt demo good balance  Wheelchair Mobility    Modified Rankin (Stroke Patients Only)       Balance Overall balance assessment: Needs assistance Sitting-balance support: Feet supported;No upper extremity supported Sitting balance-Leahy Scale: Good     Standing balance support: During functional activity;No upper extremity supported Standing balance-Leahy Scale: Fair               High level balance activites: Direction changes;Turns;Sudden stops High Level Balance Comments: no LOB    Cognition Arousal/Alertness: Awake/alert Behavior During Therapy: WFL for tasks assessed/performed Overall Cognitive Status: Within Functional Limits for tasks assessed                      Exercises      General Comments General comments (skin integrity, edema, etc.): readjusted aspen collar and called [employer] due to missing parts      Pertinent Vitals/Pain Pain Assessment: 0-10 Pain Score: 6  Pain Location: shoulders and throat with swallowing Pain Descriptors / Indicators: Aching Pain Intervention(s): Monitored during session;Premedicated before session;Repositioned    Home Living                      Prior Function            PT Goals (current goals can now be found in the care plan section) Acute Rehab PT Goals Patient Stated Goal: to go home PT Goal Formulation: With patient/family Time For Goal Achievement: 11/29/13 Potential to Achieve Goals: Good Progress towards PT goals: Progressing toward goals    Frequency  Min 4X/week    PT Plan Current plan remains appropriate  Co-evaluation             End of Session Equipment Utilized During Treatment: Gait belt;Cervical collar Activity Tolerance: Patient tolerated treatment well Patient left: in bed;with call bell/phone within  reach;with family/visitor present     Time: 9323-5573 PT Time Calculation (min): 25 min  Charges:  [employer] Training: 23-37 mins                    G Codes:      Elie Confer [place], [place] 11/16/2013, 12:11 PM

## 2013-11-17 DIAGNOSIS — M542 Cervicalgia: Secondary | ICD-10-CM | POA: Diagnosis not present

## 2013-11-17 DIAGNOSIS — M5412 Radiculopathy, cervical region: Secondary | ICD-10-CM | POA: Diagnosis not present

## 2013-11-17 DIAGNOSIS — I10 Essential (primary) hypertension: Secondary | ICD-10-CM | POA: Diagnosis not present

## 2013-11-17 DIAGNOSIS — Q76412 Congenital kyphosis, cervical region: Secondary | ICD-10-CM | POA: Diagnosis not present

## 2013-11-17 DIAGNOSIS — E785 Hyperlipidemia, unspecified: Secondary | ICD-10-CM | POA: Diagnosis not present

## 2013-11-17 DIAGNOSIS — R258 Other abnormal involuntary movements: Secondary | ICD-10-CM | POA: Diagnosis not present

## 2013-11-17 DIAGNOSIS — Z4889 Encounter for other specified surgical aftercare: Secondary | ICD-10-CM | POA: Diagnosis not present

## 2013-11-19 DIAGNOSIS — Q76412 Congenital kyphosis, cervical region: Secondary | ICD-10-CM | POA: Diagnosis not present

## 2013-11-19 DIAGNOSIS — I10 Essential (primary) hypertension: Secondary | ICD-10-CM | POA: Diagnosis not present

## 2013-11-19 DIAGNOSIS — M5412 Radiculopathy, cervical region: Secondary | ICD-10-CM | POA: Diagnosis not present

## 2013-11-19 DIAGNOSIS — E785 Hyperlipidemia, unspecified: Secondary | ICD-10-CM | POA: Diagnosis not present

## 2013-11-19 DIAGNOSIS — Z4889 Encounter for other specified surgical aftercare: Secondary | ICD-10-CM | POA: Diagnosis not present

## 2013-11-19 DIAGNOSIS — M542 Cervicalgia: Secondary | ICD-10-CM | POA: Diagnosis not present

## 2013-11-20 NOTE — Progress Notes (Signed)
Kele Barthelemy, PT DPT  [phone]  

## 2013-11-21 DIAGNOSIS — Q76412 Congenital kyphosis, cervical region: Secondary | ICD-10-CM | POA: Diagnosis not present

## 2013-11-21 DIAGNOSIS — M5412 Radiculopathy, cervical region: Secondary | ICD-10-CM | POA: Diagnosis not present

## 2013-11-21 DIAGNOSIS — M542 Cervicalgia: Secondary | ICD-10-CM | POA: Diagnosis not present

## 2013-11-21 DIAGNOSIS — I10 Essential (primary) hypertension: Secondary | ICD-10-CM | POA: Diagnosis not present

## 2013-11-21 DIAGNOSIS — E785 Hyperlipidemia, unspecified: Secondary | ICD-10-CM | POA: Diagnosis not present

## 2013-11-21 DIAGNOSIS — Z4889 Encounter for other specified surgical aftercare: Secondary | ICD-10-CM | POA: Diagnosis not present

## 2013-11-23 DIAGNOSIS — E785 Hyperlipidemia, unspecified: Secondary | ICD-10-CM | POA: Diagnosis not present

## 2013-11-23 DIAGNOSIS — M542 Cervicalgia: Secondary | ICD-10-CM | POA: Diagnosis not present

## 2013-11-23 DIAGNOSIS — Z4889 Encounter for other specified surgical aftercare: Secondary | ICD-10-CM | POA: Diagnosis not present

## 2013-11-23 DIAGNOSIS — I10 Essential (primary) hypertension: Secondary | ICD-10-CM | POA: Diagnosis not present

## 2013-11-23 DIAGNOSIS — Q76412 Congenital kyphosis, cervical region: Secondary | ICD-10-CM | POA: Diagnosis not present

## 2013-11-23 DIAGNOSIS — M5412 Radiculopathy, cervical region: Secondary | ICD-10-CM | POA: Diagnosis not present

## 2013-11-26 DIAGNOSIS — Z4889 Encounter for other specified surgical aftercare: Secondary | ICD-10-CM | POA: Diagnosis not present

## 2013-11-26 DIAGNOSIS — E785 Hyperlipidemia, unspecified: Secondary | ICD-10-CM | POA: Diagnosis not present

## 2013-11-26 DIAGNOSIS — I10 Essential (primary) hypertension: Secondary | ICD-10-CM | POA: Diagnosis not present

## 2013-11-26 DIAGNOSIS — M5412 Radiculopathy, cervical region: Secondary | ICD-10-CM | POA: Diagnosis not present

## 2013-11-26 DIAGNOSIS — M542 Cervicalgia: Secondary | ICD-10-CM | POA: Diagnosis not present

## 2013-11-26 DIAGNOSIS — Q76412 Congenital kyphosis, cervical region: Secondary | ICD-10-CM | POA: Diagnosis not present

## 2013-11-29 DIAGNOSIS — M5412 Radiculopathy, cervical region: Secondary | ICD-10-CM | POA: Diagnosis not present

## 2013-11-29 DIAGNOSIS — Q76412 Congenital kyphosis, cervical region: Secondary | ICD-10-CM | POA: Diagnosis not present

## 2013-11-29 DIAGNOSIS — M542 Cervicalgia: Secondary | ICD-10-CM | POA: Diagnosis not present

## 2013-11-29 DIAGNOSIS — I10 Essential (primary) hypertension: Secondary | ICD-10-CM | POA: Diagnosis not present

## 2013-11-29 DIAGNOSIS — E785 Hyperlipidemia, unspecified: Secondary | ICD-10-CM | POA: Diagnosis not present

## 2013-11-29 DIAGNOSIS — Z4889 Encounter for other specified surgical aftercare: Secondary | ICD-10-CM | POA: Diagnosis not present

## 2013-12-05 DIAGNOSIS — M6281 Muscle weakness (generalized): Secondary | ICD-10-CM | POA: Diagnosis not present

## 2013-12-05 DIAGNOSIS — M542 Cervicalgia: Secondary | ICD-10-CM | POA: Diagnosis not present

## 2013-12-05 DIAGNOSIS — R293 Abnormal posture: Secondary | ICD-10-CM | POA: Diagnosis not present

## 2013-12-05 DIAGNOSIS — M25519 Pain in unspecified shoulder: Secondary | ICD-10-CM | POA: Diagnosis not present

## 2013-12-06 DIAGNOSIS — M25519 Pain in unspecified shoulder: Secondary | ICD-10-CM | POA: Diagnosis not present

## 2013-12-06 DIAGNOSIS — R293 Abnormal posture: Secondary | ICD-10-CM | POA: Diagnosis not present

## 2013-12-06 DIAGNOSIS — M6281 Muscle weakness (generalized): Secondary | ICD-10-CM | POA: Diagnosis not present

## 2013-12-06 DIAGNOSIS — M542 Cervicalgia: Secondary | ICD-10-CM | POA: Diagnosis not present

## 2013-12-10 DIAGNOSIS — M25519 Pain in unspecified shoulder: Secondary | ICD-10-CM | POA: Diagnosis not present

## 2013-12-10 DIAGNOSIS — M542 Cervicalgia: Secondary | ICD-10-CM | POA: Diagnosis not present

## 2013-12-10 DIAGNOSIS — R293 Abnormal posture: Secondary | ICD-10-CM | POA: Diagnosis not present

## 2013-12-10 DIAGNOSIS — M6281 Muscle weakness (generalized): Secondary | ICD-10-CM | POA: Diagnosis not present

## 2013-12-11 DIAGNOSIS — M542 Cervicalgia: Secondary | ICD-10-CM | POA: Diagnosis not present

## 2013-12-11 DIAGNOSIS — I10 Essential (primary) hypertension: Secondary | ICD-10-CM | POA: Diagnosis not present

## 2013-12-11 DIAGNOSIS — Z48811 Encounter for surgical aftercare following surgery on the nervous system: Secondary | ICD-10-CM | POA: Diagnosis not present

## 2013-12-12 DIAGNOSIS — R293 Abnormal posture: Secondary | ICD-10-CM | POA: Diagnosis not present

## 2013-12-12 DIAGNOSIS — M25519 Pain in unspecified shoulder: Secondary | ICD-10-CM | POA: Diagnosis not present

## 2013-12-12 DIAGNOSIS — M542 Cervicalgia: Secondary | ICD-10-CM | POA: Diagnosis not present

## 2013-12-12 DIAGNOSIS — M6281 Muscle weakness (generalized): Secondary | ICD-10-CM | POA: Diagnosis not present

## 2013-12-13 DIAGNOSIS — M542 Cervicalgia: Secondary | ICD-10-CM | POA: Diagnosis not present

## 2013-12-13 DIAGNOSIS — R293 Abnormal posture: Secondary | ICD-10-CM | POA: Diagnosis not present

## 2013-12-13 DIAGNOSIS — M6281 Muscle weakness (generalized): Secondary | ICD-10-CM | POA: Diagnosis not present

## 2013-12-13 DIAGNOSIS — M25519 Pain in unspecified shoulder: Secondary | ICD-10-CM | POA: Diagnosis not present

## 2013-12-17 DIAGNOSIS — R293 Abnormal posture: Secondary | ICD-10-CM | POA: Diagnosis not present

## 2013-12-17 DIAGNOSIS — M6281 Muscle weakness (generalized): Secondary | ICD-10-CM | POA: Diagnosis not present

## 2013-12-17 DIAGNOSIS — M542 Cervicalgia: Secondary | ICD-10-CM | POA: Diagnosis not present

## 2013-12-17 DIAGNOSIS — M25519 Pain in unspecified shoulder: Secondary | ICD-10-CM | POA: Diagnosis not present

## 2013-12-19 DIAGNOSIS — M542 Cervicalgia: Secondary | ICD-10-CM | POA: Diagnosis not present

## 2013-12-19 DIAGNOSIS — R293 Abnormal posture: Secondary | ICD-10-CM | POA: Diagnosis not present

## 2013-12-19 DIAGNOSIS — M25519 Pain in unspecified shoulder: Secondary | ICD-10-CM | POA: Diagnosis not present

## 2013-12-19 DIAGNOSIS — M6281 Muscle weakness (generalized): Secondary | ICD-10-CM | POA: Diagnosis not present

## 2013-12-20 DIAGNOSIS — M542 Cervicalgia: Secondary | ICD-10-CM | POA: Diagnosis not present

## 2013-12-20 DIAGNOSIS — M6281 Muscle weakness (generalized): Secondary | ICD-10-CM | POA: Diagnosis not present

## 2013-12-20 DIAGNOSIS — M25519 Pain in unspecified shoulder: Secondary | ICD-10-CM | POA: Diagnosis not present

## 2013-12-20 DIAGNOSIS — R293 Abnormal posture: Secondary | ICD-10-CM | POA: Diagnosis not present

## 2013-12-24 ENCOUNTER — Other Ambulatory Visit: Payer: Self-pay | Admitting: Diagnostic Neuroimaging

## 2013-12-24 DIAGNOSIS — M542 Cervicalgia: Secondary | ICD-10-CM | POA: Diagnosis not present

## 2013-12-24 DIAGNOSIS — R293 Abnormal posture: Secondary | ICD-10-CM | POA: Diagnosis not present

## 2013-12-24 DIAGNOSIS — M6281 Muscle weakness (generalized): Secondary | ICD-10-CM | POA: Diagnosis not present

## 2013-12-24 DIAGNOSIS — M25519 Pain in unspecified shoulder: Secondary | ICD-10-CM | POA: Diagnosis not present

## 2013-12-26 DIAGNOSIS — M25519 Pain in unspecified shoulder: Secondary | ICD-10-CM | POA: Diagnosis not present

## 2013-12-26 DIAGNOSIS — R293 Abnormal posture: Secondary | ICD-10-CM | POA: Diagnosis not present

## 2013-12-26 DIAGNOSIS — M542 Cervicalgia: Secondary | ICD-10-CM | POA: Diagnosis not present

## 2013-12-26 DIAGNOSIS — M6281 Muscle weakness (generalized): Secondary | ICD-10-CM | POA: Diagnosis not present

## 2013-12-27 DIAGNOSIS — M25519 Pain in unspecified shoulder: Secondary | ICD-10-CM | POA: Diagnosis not present

## 2013-12-27 DIAGNOSIS — M542 Cervicalgia: Secondary | ICD-10-CM | POA: Diagnosis not present

## 2013-12-27 DIAGNOSIS — M6281 Muscle weakness (generalized): Secondary | ICD-10-CM | POA: Diagnosis not present

## 2013-12-27 DIAGNOSIS — R293 Abnormal posture: Secondary | ICD-10-CM | POA: Diagnosis not present

## 2013-12-31 DIAGNOSIS — M542 Cervicalgia: Secondary | ICD-10-CM | POA: Diagnosis not present

## 2013-12-31 DIAGNOSIS — R293 Abnormal posture: Secondary | ICD-10-CM | POA: Diagnosis not present

## 2013-12-31 DIAGNOSIS — M25519 Pain in unspecified shoulder: Secondary | ICD-10-CM | POA: Diagnosis not present

## 2013-12-31 DIAGNOSIS — M6281 Muscle weakness (generalized): Secondary | ICD-10-CM | POA: Diagnosis not present

## 2014-01-02 DIAGNOSIS — M25519 Pain in unspecified shoulder: Secondary | ICD-10-CM | POA: Diagnosis not present

## 2014-01-02 DIAGNOSIS — R293 Abnormal posture: Secondary | ICD-10-CM | POA: Diagnosis not present

## 2014-01-02 DIAGNOSIS — M542 Cervicalgia: Secondary | ICD-10-CM | POA: Diagnosis not present

## 2014-01-02 DIAGNOSIS — M6281 Muscle weakness (generalized): Secondary | ICD-10-CM | POA: Diagnosis not present

## 2014-01-03 DIAGNOSIS — M542 Cervicalgia: Secondary | ICD-10-CM | POA: Diagnosis not present

## 2014-01-03 DIAGNOSIS — M6281 Muscle weakness (generalized): Secondary | ICD-10-CM | POA: Diagnosis not present

## 2014-01-03 DIAGNOSIS — M25519 Pain in unspecified shoulder: Secondary | ICD-10-CM | POA: Diagnosis not present

## 2014-01-03 DIAGNOSIS — R293 Abnormal posture: Secondary | ICD-10-CM | POA: Diagnosis not present

## 2014-01-08 DIAGNOSIS — M6281 Muscle weakness (generalized): Secondary | ICD-10-CM | POA: Diagnosis not present

## 2014-01-08 DIAGNOSIS — M542 Cervicalgia: Secondary | ICD-10-CM | POA: Diagnosis not present

## 2014-01-08 DIAGNOSIS — M25519 Pain in unspecified shoulder: Secondary | ICD-10-CM | POA: Diagnosis not present

## 2014-01-08 DIAGNOSIS — R293 Abnormal posture: Secondary | ICD-10-CM | POA: Diagnosis not present

## 2014-01-09 ENCOUNTER — Encounter: Payer: Self-pay | Admitting: Neurology

## 2014-01-09 DIAGNOSIS — M25519 Pain in unspecified shoulder: Secondary | ICD-10-CM | POA: Diagnosis not present

## 2014-01-09 DIAGNOSIS — R293 Abnormal posture: Secondary | ICD-10-CM | POA: Diagnosis not present

## 2014-01-09 DIAGNOSIS — M6281 Muscle weakness (generalized): Secondary | ICD-10-CM | POA: Diagnosis not present

## 2014-01-09 DIAGNOSIS — M542 Cervicalgia: Secondary | ICD-10-CM | POA: Diagnosis not present

## 2014-01-10 DIAGNOSIS — M25519 Pain in unspecified shoulder: Secondary | ICD-10-CM | POA: Diagnosis not present

## 2014-01-10 DIAGNOSIS — M542 Cervicalgia: Secondary | ICD-10-CM | POA: Diagnosis not present

## 2014-01-10 DIAGNOSIS — R293 Abnormal posture: Secondary | ICD-10-CM | POA: Diagnosis not present

## 2014-01-10 DIAGNOSIS — M6281 Muscle weakness (generalized): Secondary | ICD-10-CM | POA: Diagnosis not present

## 2014-01-14 DIAGNOSIS — R293 Abnormal posture: Secondary | ICD-10-CM | POA: Diagnosis not present

## 2014-01-14 DIAGNOSIS — M6281 Muscle weakness (generalized): Secondary | ICD-10-CM | POA: Diagnosis not present

## 2014-01-14 DIAGNOSIS — M542 Cervicalgia: Secondary | ICD-10-CM | POA: Diagnosis not present

## 2014-01-14 DIAGNOSIS — M25519 Pain in unspecified shoulder: Secondary | ICD-10-CM | POA: Diagnosis not present

## 2014-01-15 ENCOUNTER — Encounter: Payer: Self-pay | Admitting: Neurology

## 2014-01-15 DIAGNOSIS — M25519 Pain in unspecified shoulder: Secondary | ICD-10-CM | POA: Diagnosis not present

## 2014-01-15 DIAGNOSIS — R293 Abnormal posture: Secondary | ICD-10-CM | POA: Diagnosis not present

## 2014-01-15 DIAGNOSIS — M6281 Muscle weakness (generalized): Secondary | ICD-10-CM | POA: Diagnosis not present

## 2014-01-15 DIAGNOSIS — M542 Cervicalgia: Secondary | ICD-10-CM | POA: Diagnosis not present

## 2014-01-16 ENCOUNTER — Encounter: Payer: Self-pay | Admitting: Diagnostic Neuroimaging

## 2014-01-16 DIAGNOSIS — R293 Abnormal posture: Secondary | ICD-10-CM | POA: Diagnosis not present

## 2014-01-16 DIAGNOSIS — M25519 Pain in unspecified shoulder: Secondary | ICD-10-CM | POA: Diagnosis not present

## 2014-01-16 DIAGNOSIS — M542 Cervicalgia: Secondary | ICD-10-CM | POA: Diagnosis not present

## 2014-01-16 DIAGNOSIS — M6281 Muscle weakness (generalized): Secondary | ICD-10-CM | POA: Diagnosis not present

## 2014-01-18 ENCOUNTER — Other Ambulatory Visit: Payer: Self-pay

## 2014-01-18 DIAGNOSIS — C50919 Malignant neoplasm of unspecified site of unspecified female breast: Secondary | ICD-10-CM

## 2014-01-21 ENCOUNTER — Telehealth: Payer: Self-pay | Admitting: Hematology and Oncology

## 2014-01-21 ENCOUNTER — Other Ambulatory Visit (HOSPITAL_BASED_OUTPATIENT_CLINIC_OR_DEPARTMENT_OTHER): Payer: Medicare Other

## 2014-01-21 ENCOUNTER — Ambulatory Visit (HOSPITAL_BASED_OUTPATIENT_CLINIC_OR_DEPARTMENT_OTHER): Payer: Medicare Other | Admitting: Hematology and Oncology

## 2014-01-21 VITALS — BP 162/91 | HR 78 | Temp 98.3°F | Resp 18 | Ht 61.0 in | Wt 134.6 lb

## 2014-01-21 DIAGNOSIS — F329 Major depressive disorder, single episode, unspecified: Secondary | ICD-10-CM | POA: Diagnosis not present

## 2014-01-21 DIAGNOSIS — C50919 Malignant neoplasm of unspecified site of unspecified female breast: Secondary | ICD-10-CM

## 2014-01-21 DIAGNOSIS — C50812 Malignant neoplasm of overlapping sites of left female breast: Secondary | ICD-10-CM

## 2014-01-21 DIAGNOSIS — C50512 Malignant neoplasm of lower-outer quadrant of left female breast: Secondary | ICD-10-CM

## 2014-01-21 DIAGNOSIS — N951 Menopausal and female climacteric states: Secondary | ICD-10-CM

## 2014-01-21 LAB — CBC WITH DIFFERENTIAL/PLATELET
BASO%: 0.8 % (ref 0.0–2.0)
Basophils Absolute: 0 10*3/uL (ref 0.0–0.1)
EOS%: 4.6 % (ref 0.0–7.0)
Eosinophils Absolute: 0.3 10*3/uL (ref 0.0–0.5)
HCT: 32.7 % — ABNORMAL LOW (ref 34.8–46.6)
HGB: 10.5 g/dL — ABNORMAL LOW (ref 11.6–15.9)
LYMPH%: 31 % (ref 14.0–49.7)
MCH: 28 pg (ref 25.1–34.0)
MCHC: 32.1 g/dL (ref 31.5–36.0)
MCV: 87.1 fL (ref 79.5–101.0)
MONO#: 0.6 10*3/uL (ref 0.1–0.9)
MONO%: 9.5 % (ref 0.0–14.0)
NEUT#: 3.1 10*3/uL (ref 1.5–6.5)
NEUT%: 54.1 % (ref 38.4–76.8)
Platelets: 384 10*3/uL (ref 145–400)
RBC: 3.76 10*6/uL (ref 3.70–5.45)
RDW: 12.9 % (ref 11.2–14.5)
WBC: 5.8 10*3/uL (ref 3.9–10.3)
lymph#: 1.8 10*3/uL (ref 0.9–3.3)

## 2014-01-21 LAB — COMPREHENSIVE METABOLIC PANEL (CC13)
ALT: 13 U/L (ref 0–55)
AST: 19 U/L (ref 5–34)
Albumin: 3.9 g/dL (ref 3.5–5.0)
Alkaline Phosphatase: 89 U/L (ref 40–150)
Anion Gap: 10 mEq/L (ref 3–11)
BUN: 11 mg/dL (ref 7.0–26.0)
CO2: 26 mEq/L (ref 22–29)
Calcium: 9.2 mg/dL (ref 8.4–10.4)
Chloride: 101 mEq/L (ref 98–109)
Creatinine: 0.8 mg/dL (ref 0.6–1.1)
Glucose: 82 mg/dl (ref 70–140)
Potassium: 4.2 mEq/L (ref 3.5–5.1)
Sodium: 137 mEq/L (ref 136–145)
Total Bilirubin: <0.2 mg/dL (ref 0.20–1.20)
Total Protein: 7.6 g/dL (ref 6.4–8.3)

## 2014-01-21 MED ORDER — VENLAFAXINE HCL 37.5 MG PO TABS: 37.5000 mg | ORAL_TABLET | Freq: Every day | ORAL | Status: DC

## 2014-01-21 MED ORDER — ANASTROZOLE 1 MG PO TABS: 1.0000 mg | ORAL_TABLET | Freq: Every day | ORAL | Status: DC

## 2014-01-21 NOTE — Assessment & Plan Note (Signed)
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  Aromatase inhibitor toxicities: Patient is a following toxicities to Arimidex 1. Hot flashes 2. Depression 3. Mood changes I instructed her that she can stop Arimidex therapy in August of 2016 as complete 5 years of treatment.  Surveillance: Today's breast exam was normal. I reviewed the mammograms were done in January 2015 which were normal. She will have another mammogram to be done in January 2016.

## 2014-01-21 NOTE — Telephone Encounter (Signed)
gv pt appt schedule for nov 2016.

## 2014-01-21 NOTE — Progress Notes (Signed)
Patient Care Team: Horton Finer, MD as PCP - General (Internal Medicine)  DIAGNOSIS: 73 year old female with history of stage I invasive ductal carcinoma status post lumpectomy on 06/16/2009  PRIOR THERAPY:  #1 patient underwent a lumpectomy on 06/16/2009 for a T1 C. N0 ER/PR positive HER-2/neu negative breast cancer. Originally seen by Dr. Eston Esters  #2 she then started on radiation therapy which he completed on 09/03/2009.  #3 she was then started on letrozole 2.5 mg for 2 years then switch to tamoxifen 20 mg daily but patient could not tolerate tamoxifen and therefore she was begun on Arimidex in October 2013.  CURRENT THERAPY: arimidex 1 mg  CHIEF COMPLIANT: followup on Arimidex therapy for breast cancer  INTERVAL HISTORY: April Church is a 73 year old Caucasian with above-mentioned history of left-sided breast cancer treated with lumpectomy and radiation currently on antiestrogen therapy the sudden August 2011. She has been tolerating Arimidex much better than the prior to antiestrogen therapies. She takes Effexor for hot flashes as well as mood swings. She will finish 5 years of therapy in August 2016. She gets annual mammograms all been normal. She denies any new lumps or nodules. Her bone density done July 2015 was normal. Patient was recently hospitalized for neck fusion surgery and she underwent surgery successfully.  REVIEW OF SYSTEMS:   Constitutional: Denies fevers, chills or abnormal weight loss Eyes: Denies blurriness of vision Ears, nose, mouth, throat, and face: Denies mucositis or sore throat Respiratory: Denies cough, dyspnea or wheezes Cardiovascular: Denies palpitation, chest discomfort or lower extremity swelling Gastrointestinal:  Denies nausea, heartburn or change in bowel habits Skin: Denies abnormal skin rashes Lymphatics: Denies new lymphadenopathy or easy bruising Neurological:Denies numbness, tingling or new weaknesses Behavioral/Psych: Mood  is stable, no new changes  Breast:  denies any pain or lumps or nodules in either breasts All other systems were reviewed with the patient and are negative.  I have reviewed the past medical history, past surgical history, social history and family history with the patient and they are unchanged from previous note.  ALLERGIES:  is allergic to shellfish allergy; sulfa drugs cross reactors; and codeine.  MEDICATIONS:  Current Outpatient Prescriptions  Medication Sig Dispense Refill  . anastrozole (ARIMIDEX) 1 MG tablet Take 1 tablet (1 mg total) by mouth daily. 90 tablet 2  . aspirin 81 MG tablet Take 81 mg by mouth daily.      . beclomethasone (QVAR) 80 MCG/ACT inhaler Inhale 1 puff into the lungs 2 (two) times daily.     [name] CALCIUM CARBONATE PO Take 1 tablet by mouth 2 (two) times daily.    . Cholecalciferol (VITAMIN D) 1000 UNITS capsule Take 1,000 Units by mouth 2 (two) times daily.     . diazepam (VALIUM) 5 MG tablet Take 1 tablet (5 mg total) by mouth every 6 (six) hours as needed for muscle spasms. 50 tablet 1  . diphenhydrAMINE (BENADRYL) 25 MG tablet Take 25 mg by mouth at bedtime as needed for itching.    . docusate sodium 100 MG CAPS Take 100 mg by mouth 2 (two) times daily. 60 capsule 0  . fluticasone (FLONASE) 50 MCG/ACT nasal spray Place 2 sprays into the nose daily.      [name] losartan (COZAAR) 50 MG tablet Take 50 mg by mouth daily.    . Omega-3 Fatty Acids (FISH OIL) 1200 MG CAPS Take 1,200 mg by mouth daily.     [name] oxyCODONE-acetaminophen (PERCOCET/ROXICET) 5-325 MG per tablet Take 1-2 tablets by  mouth every 4 (four) hours as needed for moderate pain. 100 tablet 0  . primidone (MYSOLINE) 50 MG tablet TAKE 2 TABLETS BY MOUTH THREE TIMES DAILY 540 tablet 0  . ranitidine (ZANTAC) 150 MG capsule Take 150 mg by mouth daily after breakfast.     . triamcinolone cream (KENALOG) 0.1 % Apply 1 application topically 2 (two) times daily.    [name] venlafaxine (EFFEXOR) 37.5 MG tablet Take 1 tablet  (37.5 mg total) by mouth daily. 90 tablet 3   No current facility-administered medications for this visit.    PHYSICAL EXAMINATION: ECOG PERFORMANCE STATUS: 0 - Asymptomatic  Filed Vitals:   01/21/14 1355  BP: 162/91  Pulse: 78  Temp: 98.3 F (36.8 C)  Resp: 18   Filed Weights   01/21/14 1355  Weight: 134 lb 9.6 oz (61.054 kg)    GENERAL:alert, no distress and comfortable SKIN: skin color, texture, turgor are normal, no rashes or significant lesions EYES: normal, Conjunctiva are pink and non-injected, sclera clear OROPHARYNX:no exudate, no erythema and lips, buccal mucosa, and tongue normal  NECK: supple, thyroid normal size, non-tender, without nodularity LYMPH:  no palpable lymphadenopathy in the cervical, axillary or inguinal LUNGS: clear to auscultation and percussion with normal breathing effort HEART: regular rate & rhythm and no murmurs and no lower extremity edema ABDOMEN:abdomen soft, non-tender and normal bowel sounds Musculoskeletal:no cyanosis of digits and no clubbing  NEURO: alert & oriented x 3 with fluent speech, no focal motor/sensory deficits BREAST: No palpable masses or nodules in either right or left breasts. No palpable axillary supraclavicular or infraclavicular adenopathy no breast tenderness or nipple discharge.   LABORATORY DATA:  I have reviewed the data as listed   Chemistry      Component Value Date/Time   NA 134* 11/14/2013 1309   NA 131* 07/19/2013 1324   K 4.1 11/14/2013 1309   K 4.3 07/19/2013 1324   CL 100 11/06/2013 1032   CL 102 06/30/2012 1231   CO2 27 11/06/2013 1032   CO2 23 07/19/2013 1324   BUN 13 11/06/2013 1032   BUN 19.5 07/19/2013 1324   CREATININE 0.73 11/06/2013 1032   CREATININE 0.8 07/19/2013 1324      Component Value Date/Time   CALCIUM 9.6 11/06/2013 1032   CALCIUM 8.9 07/19/2013 1324   ALKPHOS 77 07/19/2013 1324   ALKPHOS 44 05/21/2011 1423   AST 18 07/19/2013 1324   AST 18 05/21/2011 1423   ALT 15  07/19/2013 1324   ALT 10 05/21/2011 1423   BILITOT 0.25 07/19/2013 1324   BILITOT 0.2* 05/21/2011 1423       Lab Results  Component Value Date   WBC 5.8 01/21/2014   HGB 10.5* 01/21/2014   HCT 32.7* 01/21/2014   MCV 87.1 01/21/2014   PLT 384 01/21/2014   NEUTROABS 3.1 01/21/2014     RADIOGRAPHIC STUDIES: I have personally reviewed the radiology reports and agreed with their findings. Mammogram done January 2015 was normal  ASSESSMENT & PLAN:  Breast cancer of lower-outer quadrant of left female breast Left breast invasive ductal carcinoma status post lobectomy 06/16/2009 ER/PR positive HER-2 negative status post radiation therapy and currently on Arimidex 1 mg daily that started August 2011.  Aromatase inhibitor toxicities: Patient is a following toxicities to Arimidex 1. Hot flashes 2. Depression 3. Mood changes I instructed her that she can stop Arimidex therapy in August of 2016 as complete 5 years of treatment.  Surveillance: Today's breast exam was normal. I reviewed the  mammograms were done in January 2015 which were normal. She will have another mammogram to be done in January 2016.  Return to clinic in one year for follow up  Orders Placed This Encounter  Procedures  . CBC with Differential    Standing Status: Future     Number of Occurrences:      Standing Expiration Date: 01/21/2015  . Comprehensive metabolic panel (Cmet) - [facility]    Standing Status: Future     Number of Occurrences:      Standing Expiration Date: 01/21/2015   The patient has a good understanding of the overall plan. she agrees with it. She will call with any problems that may develop before her next visit here.   Rulon Eisenmenger, MD 01/21/2014 2:20 PM

## 2014-01-22 DIAGNOSIS — M25519 Pain in unspecified shoulder: Secondary | ICD-10-CM | POA: Diagnosis not present

## 2014-01-22 DIAGNOSIS — M542 Cervicalgia: Secondary | ICD-10-CM | POA: Diagnosis not present

## 2014-01-22 DIAGNOSIS — M6281 Muscle weakness (generalized): Secondary | ICD-10-CM | POA: Diagnosis not present

## 2014-01-22 DIAGNOSIS — R293 Abnormal posture: Secondary | ICD-10-CM | POA: Diagnosis not present

## 2014-01-31 ENCOUNTER — Other Ambulatory Visit: Payer: Self-pay

## 2014-01-31 DIAGNOSIS — D485 Neoplasm of uncertain behavior of skin: Secondary | ICD-10-CM | POA: Diagnosis not present

## 2014-01-31 DIAGNOSIS — L820 Inflamed seborrheic keratosis: Secondary | ICD-10-CM | POA: Diagnosis not present

## 2014-01-31 DIAGNOSIS — L821 Other seborrheic keratosis: Secondary | ICD-10-CM | POA: Diagnosis not present

## 2014-02-26 DIAGNOSIS — Z8041 Family history of malignant neoplasm of ovary: Secondary | ICD-10-CM | POA: Diagnosis not present

## 2014-02-26 DIAGNOSIS — R102 Pelvic and perineal pain: Secondary | ICD-10-CM | POA: Diagnosis not present

## 2014-02-26 DIAGNOSIS — R140 Abdominal distension (gaseous): Secondary | ICD-10-CM | POA: Diagnosis not present

## 2014-02-27 ENCOUNTER — Other Ambulatory Visit: Payer: Self-pay | Admitting: *Deleted

## 2014-02-27 DIAGNOSIS — C50512 Malignant neoplasm of lower-outer quadrant of left female breast: Secondary | ICD-10-CM

## 2014-03-01 ENCOUNTER — Other Ambulatory Visit: Payer: Self-pay | Admitting: *Deleted

## 2014-03-01 DIAGNOSIS — C50512 Malignant neoplasm of lower-outer quadrant of left female breast: Secondary | ICD-10-CM

## 2014-03-06 ENCOUNTER — Telehealth: Payer: Self-pay

## 2014-03-06 NOTE — Telephone Encounter (Signed)
Order sent to [facility] for mammogram/us

## 2014-03-12 DIAGNOSIS — I10 Essential (primary) hypertension: Secondary | ICD-10-CM | POA: Diagnosis not present

## 2014-03-12 DIAGNOSIS — M542 Cervicalgia: Secondary | ICD-10-CM | POA: Diagnosis not present

## 2014-03-18 DIAGNOSIS — Z853 Personal history of malignant neoplasm of breast: Secondary | ICD-10-CM | POA: Diagnosis not present

## 2014-03-18 DIAGNOSIS — R928 Other abnormal and inconclusive findings on diagnostic imaging of breast: Secondary | ICD-10-CM | POA: Diagnosis not present

## 2014-03-19 ENCOUNTER — Other Ambulatory Visit: Payer: Self-pay

## 2014-03-19 DIAGNOSIS — D485 Neoplasm of uncertain behavior of skin: Secondary | ICD-10-CM | POA: Diagnosis not present

## 2014-03-19 DIAGNOSIS — L821 Other seborrheic keratosis: Secondary | ICD-10-CM | POA: Diagnosis not present

## 2014-03-19 DIAGNOSIS — D045 Carcinoma in situ of skin of trunk: Secondary | ICD-10-CM | POA: Diagnosis not present

## 2014-03-26 ENCOUNTER — Other Ambulatory Visit: Payer: Self-pay | Admitting: Diagnostic Neuroimaging

## 2014-04-08 DIAGNOSIS — Z961 Presence of intraocular lens: Secondary | ICD-10-CM | POA: Diagnosis not present

## 2014-04-08 DIAGNOSIS — H02831 Dermatochalasis of right upper eyelid: Secondary | ICD-10-CM | POA: Diagnosis not present

## 2014-04-08 DIAGNOSIS — H02834 Dermatochalasis of left upper eyelid: Secondary | ICD-10-CM | POA: Diagnosis not present

## 2014-04-29 DIAGNOSIS — Z0000 Encounter for general adult medical examination without abnormal findings: Secondary | ICD-10-CM | POA: Diagnosis not present

## 2014-04-29 DIAGNOSIS — Z1389 Encounter for screening for other disorder: Secondary | ICD-10-CM | POA: Diagnosis not present

## 2014-04-29 DIAGNOSIS — E780 Pure hypercholesterolemia: Secondary | ICD-10-CM | POA: Diagnosis not present

## 2014-04-29 DIAGNOSIS — I10 Essential (primary) hypertension: Secondary | ICD-10-CM | POA: Diagnosis not present

## 2014-04-29 DIAGNOSIS — Z86010 Personal history of colonic polyps: Secondary | ICD-10-CM | POA: Diagnosis not present

## 2014-04-29 DIAGNOSIS — C4491 Basal cell carcinoma of skin, unspecified: Secondary | ICD-10-CM | POA: Diagnosis not present

## 2014-05-14 ENCOUNTER — Telehealth: Payer: Self-pay | Admitting: *Deleted

## 2014-05-14 NOTE — Telephone Encounter (Signed)
Spoke with the pt on the phone and was able to get her appt changed.

## 2014-05-22 ENCOUNTER — Ambulatory Visit (INDEPENDENT_AMBULATORY_CARE_PROVIDER_SITE_OTHER): Payer: Medicare Other | Admitting: Diagnostic Neuroimaging

## 2014-05-22 ENCOUNTER — Encounter: Payer: Self-pay | Admitting: Diagnostic Neuroimaging

## 2014-05-22 VITALS — BP 129/89 | HR 92 | Temp 97.0°F | Resp 16 | Ht 61.0 in | Wt 134.0 lb

## 2014-05-22 DIAGNOSIS — G250 Essential tremor: Secondary | ICD-10-CM | POA: Diagnosis not present

## 2014-05-22 MED ORDER — PRIMIDONE 250 MG PO TABS: 250.0000 mg | ORAL_TABLET | Freq: Two times a day (BID) | ORAL | Status: DC

## 2014-05-22 NOTE — Progress Notes (Signed)
[facility]  PATIENT: April Church DOB: 12/04/1940  REFERRING CLINICIAN:  HISTORY FROM: patient REASON FOR VISIT: follow up   Chief Complaint  Patient presents with  . Essential Tremor    Rm 6 F/U  . Cervical Spondylosis without myelopathy    HISTORICAL  CHIEF COMPLAINT:  Chief Complaint  Patient presents with  . Essential Tremor    Rm 6 F/U  . Cervical Spondylosis without myelopathy    HISTORY OF PRESENT ILLNESS:   UPDATE 05/22/14: Since last visit, tremor progressing. Now notes new resting tremor in the last year. Some balance and walking issues. She is s/p cervical decompression in Sept 2015, with good results.   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 Her tremor does not limit any of her activities of daily life. She denies any side effects of the Mysoline. She also reports today that she's had some numbness and tingling down her right arm from her neck and shoulder. It has been bothering her more in the last 6 months. 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 She saw Dr. Arnoldo Morale back in 2004. She returns for reevaluation  UPDATE 06/07/12 (VRP): Doing well. BUE tremor is stable and controlled. Voice tremor is slightly progressing. Tolerating primidone.  PRIOR HPI (06/09/11, VRP):  74 yrs old female returns for F/U. Last seen 06/09/10.  She has been followed in this office for many years for essential tremor. She takes primidone 50 mg, 1.5 TID. She tolerates it well. She comes in today saying that her tremor is a little worse before lunch.  The tremor limits her a little bit with eating and writing but for the most part she is happy with her level of function.She has hx of  breast cancer.  No neurologic complaints.   REVIEW OF SYSTEMS: Full 14 system review of systems performed and notable only for  tremor.  ALLERGIES: Allergies  Allergen Reactions  . Shellfish Allergy Shortness Of Breath  . Sulfa Drugs Cross Reactors Anxiety and Rash  . Codeine Anxiety    HOME MEDICATIONS: Outpatient Prescriptions Prior to Visit  Medication Sig Dispense Refill  . anastrozole (ARIMIDEX) 1 MG tablet Take 1 tablet (1 mg total) by mouth daily. 90 tablet 2  . aspirin 81 MG tablet Take 81 mg by mouth daily.      . beclomethasone (QVAR) 80 MCG/ACT inhaler Inhale 1 puff into the lungs 2 (two) times daily.     [name] CALCIUM CARBONATE PO Take 1 tablet by mouth 2 (two) times daily.    . Cholecalciferol (VITAMIN D) 1000 UNITS capsule Take 1,000 Units by mouth 2 (two) times daily.     . diphenhydrAMINE (BENADRYL) 25 MG tablet Take 25 mg by mouth at bedtime as needed for itching.    . docusate sodium 100 MG CAPS Take 100 mg by mouth 2 (two) times daily. 60 capsule 0  . fluticasone (FLONASE) 50 MCG/ACT nasal spray Place 2 sprays into the nose daily.      . Omega-3 Fatty Acids (FISH OIL) 1200 MG CAPS Take 1,200 mg by mouth daily.     [name] oxyCODONE-acetaminophen (PERCOCET/ROXICET) 5-325 MG per tablet Take 1-2 tablets by mouth every 4 (four) hours as needed for moderate pain. 100 tablet 0  . ranitidine (ZANTAC) 150 MG capsule Take 150 mg by mouth daily after breakfast.     . triamcinolone cream (  KENALOG) 0.1 % Apply 1 application topically 2 (two) times daily.    [name] venlafaxine (EFFEXOR) 37.5 MG tablet Take 1 tablet (37.5 mg total) by mouth daily. 90 tablet 3  . diazepam (VALIUM) 5 MG tablet Take 1 tablet (5 mg total) by mouth every 6 (six) hours as needed for muscle spasms. 50 tablet 1  . losartan (COZAAR) 50 MG tablet Take 50 mg by mouth daily.    . primidone (MYSOLINE) 50 MG tablet TAKE 2 TABLETS BY MOUTH THREE TIMES DAILY 540 tablet 0   No facility-administered medications prior to visit.    PAST MEDICAL HISTORY: Past Medical History  Diagnosis Date  . Tremors of nervous system     hands  . GERD  (gastroesophageal reflux disease)   . 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 Cyst of finger 11/2011    annular cyst right long finger  . Hypertension     under control, has been on med. x 2 yrs.  . Dental crowns present   . Asthma     daily inhaler  . Hyperlipidemia   . PONV (postoperative nausea and vomiting)   . Frequency of urination   . Breast cancer     left  . Seasonal allergies   . Dermatitis     PAST SURGICAL HISTORY: Past Surgical History  Procedure Laterality Date  . Nasal sinus surgery      x 2  . Foot surgery  06/22/11    left  . Knee arthroscopy  03/12/2005    right  . Nephrectomy living donor Left 04/2008  . Appendectomy  age 2  . Tumor excision  age 10    right arm  . Cataract extraction, bilateral    . Breast surgery  1999    reduction  . Breast lumpectomy  06/16/2009    left; SLN bx.  . Breast lumpectomy  07/01/2009    re-excision  . Knee arthroscopy      left  . Trigger finger release  12/16/2011    Procedure: RELEASE TRIGGER FINGER/A-1 PULLEY;  Surgeon: Tennis Must, MD;  Location: [facility];  Service: Orthopedics;  Laterality: Right;  RIGHT LONG FINGER TRIGGER RELEASE & ANNULAR CYST EXCISION  . Anterior cervical decompression/discectomy fusion 4 levels N/A 11/14/2013    Procedure: ANTERIOR CERVICAL DECOMPRESSION/DISCECTOMY FUSION 4 LEVELS;  Surgeon: Newman Pies, MD;  Location: [place] [facility];  Service: Neurosurgery;  Laterality: N/A;  C34 C45 C56 C67 anterior cervical fusion with interbody prosthesis plating and  bonegraft    FAMILY HISTORY: Family History  Problem Relation Age of Onset  . Kidney disease Mother   . Stroke Father   . Diabetes Brother   . Heart disease Brother   . Cancer Sister     SOCIAL HISTORY:  History   Social History  . Marital Status: Married    Spouse Name: April Church   . Number of Children: 1  . Years of Education: HS   Occupational History  . Retired    Social History Main  Topics  . Smoking status: Never Smoker   . Smokeless tobacco: Never Used  . Alcohol Use: No  . Drug Use: No  . Sexual Activity: Yes   Other Topics Concern  . Not on file   Social History Narrative      Pt lives at home with her spouse.  [name]    Patient has 1 child.    Patient is retired.  Patient has a HS education.    Patient is right handed.    Patient drinks caffeine occasionally.                PHYSICAL EXAM  Filed Vitals:   05/22/14 1255  BP: 129/89  Pulse: 92  Temp: 97 F (36.1 C)  TempSrc: Oral  Resp: 16  Height: 5\' 1"  (1.549 m)  Weight: 134 lb (60.782 kg)   Body mass index is 25.33 kg/(m^2).  GENERAL EXAM: Patient is in no distress  CARDIOVASCULAR: Regular rate and rhythm, no murmurs, no carotid bruits  NEUROLOGIC: MENTAL STATUS: awake, alert, language fluent, comprehension intact, naming intact; MASKED FACIES; NEG FRONTAL RELEASE SIGNS CRANIAL NERVE: pupils equal and reactive to light, visual fields full to confrontation, extraocular muscles intact, no nystagmus, facial sensation and strength symmetric, uvula midline, shoulder shrug symmetric, tongue midline; VOICE TREMOR. MOTOR: normal bulk, POSTURAL AND ACTION TREMOR; INT REST TREMOR; BRADYKINESIA AND COGWHEELING IN LUE > RUE; BRADYKINESIA IN LLE > RLE; full strength in the BUE, BLE SENSORY: normal and symmetric to light touch COORDINATION: finger-nose-finger, fine finger movements normal REFLEXES: deep tendon reflexes present and symmetric GAIT/STATION: narrow based gait; able to tandem; romberg is negative   DIAGNOSTIC DATA (LABS, IMAGING, TESTING) - I reviewed patient records, labs, notes, testing and imaging myself where available.  Lab Results  Component Value Date   WBC 5.8 01/21/2014   HGB 10.5* 01/21/2014   HCT 32.7* 01/21/2014   MCV 87.1 01/21/2014   PLT 384 01/21/2014      Component Value Date/Time   NA 137 01/21/2014 1341   NA 134* 11/14/2013 1309   K 4.2 01/21/2014 1341    K 4.1 11/14/2013 1309   CL 100 11/06/2013 1032   CL 102 06/30/2012 1231   CO2 26 01/21/2014 1341   CO2 27 11/06/2013 1032   GLUCOSE 82 01/21/2014 1341   GLUCOSE 176* 11/14/2013 1309   GLUCOSE 77 06/30/2012 1231   BUN 11.0 01/21/2014 1341   BUN 13 11/06/2013 1032   CREATININE 0.8 01/21/2014 1341   CREATININE 0.73 11/06/2013 1032   CALCIUM 9.2 01/21/2014 1341   CALCIUM 9.6 11/06/2013 1032   PROT 7.6 01/21/2014 1341   PROT 6.7 05/21/2011 1423   ALBUMIN 3.9 01/21/2014 1341   ALBUMIN 4.0 05/21/2011 1423   AST 19 01/21/2014 1341   AST 18 05/21/2011 1423   ALT 13 01/21/2014 1341   ALT 10 05/21/2011 1423   ALKPHOS 89 01/21/2014 1341   ALKPHOS 44 05/21/2011 1423   BILITOT <0.20 01/21/2014 1341   BILITOT 0.2* 05/21/2011 1423   GFRNONAA 83* 11/06/2013 1032   GFRAA >90 11/06/2013 1032   No results found for: CHOL No results found for: HGBA1C No results found for: VITAMINB12 No results found for: TSH   06/25/13 EMG/NCS 1. 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 2. No evidence of widespread underlying large fiber neuropathy.   ASSESSMENT AND PLAN  74 y.o. year old female  has a past medical history of Tremors of nervous system; GERD (gastroesophageal reflux disease); Trigger finger of right hand (11/2011); Cyst of finger (11/2011); Hypertension; Dental crowns present; Asthma; Hyperlipidemia; PONV (postoperative nausea and vomiting); Frequency of urination; Breast cancer; Seasonal allergies; and Dermatitis. here with essential tremor since 1990's. Now with intermittent resting tremor, bradykinesia, cogwheeling since 2016. May represent essential tremor with superimposed parkinson's disease.  Dx: essential tremor + possible parkinsonism  PLAN: 1. Increase primidone to 250mg  BID 2. Caution with walking and balance 3. May consider trial of carbidopa/levodopa at next  visit  Return in about 3 months (around 08/22/2014).     Penni Bombard, MD 1/61/0960, 4:54 PM Certified in Neurology, Neurophysiology and Neuroimaging  [facility] [address] [place], [place] [address] [phone]

## 2014-05-24 ENCOUNTER — Telehealth: Payer: Self-pay | Admitting: Hematology and Oncology

## 2014-05-24 NOTE — Telephone Encounter (Signed)
Mailed pt medical records to [facility]

## 2014-05-30 DIAGNOSIS — J309 Allergic rhinitis, unspecified: Secondary | ICD-10-CM | POA: Diagnosis not present

## 2014-05-30 DIAGNOSIS — T887XXD Unspecified adverse effect of drug or medicament, subsequent encounter: Secondary | ICD-10-CM | POA: Diagnosis not present

## 2014-05-30 DIAGNOSIS — T782XXD Anaphylactic shock, unspecified, subsequent encounter: Secondary | ICD-10-CM | POA: Diagnosis not present

## 2014-05-30 DIAGNOSIS — J387 Other diseases of larynx: Secondary | ICD-10-CM | POA: Diagnosis not present

## 2014-05-30 DIAGNOSIS — J3801 Paralysis of vocal cords and larynx, unilateral: Secondary | ICD-10-CM | POA: Diagnosis not present

## 2014-05-30 DIAGNOSIS — R21 Rash and other nonspecific skin eruption: Secondary | ICD-10-CM | POA: Diagnosis not present

## 2014-05-30 DIAGNOSIS — J4530 Mild persistent asthma, uncomplicated: Secondary | ICD-10-CM | POA: Diagnosis not present

## 2014-05-30 DIAGNOSIS — R0600 Dyspnea, unspecified: Secondary | ICD-10-CM | POA: Diagnosis not present

## 2014-06-14 ENCOUNTER — Ambulatory Visit: Payer: Medicare Other | Admitting: Diagnostic Neuroimaging

## 2014-06-14 ENCOUNTER — Ambulatory Visit: Payer: Medicare Other | Admitting: Nurse Practitioner

## 2014-06-17 DIAGNOSIS — C50919 Malignant neoplasm of unspecified site of unspecified female breast: Secondary | ICD-10-CM | POA: Diagnosis not present

## 2014-06-17 DIAGNOSIS — Z9889 Other specified postprocedural states: Secondary | ICD-10-CM | POA: Diagnosis not present

## 2014-06-17 DIAGNOSIS — I10 Essential (primary) hypertension: Secondary | ICD-10-CM | POA: Diagnosis not present

## 2014-06-17 DIAGNOSIS — Z524 Kidney donor: Secondary | ICD-10-CM | POA: Diagnosis not present

## 2014-06-17 DIAGNOSIS — Z905 Acquired absence of kidney: Secondary | ICD-10-CM | POA: Diagnosis not present

## 2014-07-10 DIAGNOSIS — L820 Inflamed seborrheic keratosis: Secondary | ICD-10-CM | POA: Diagnosis not present

## 2014-07-10 DIAGNOSIS — L570 Actinic keratosis: Secondary | ICD-10-CM | POA: Diagnosis not present

## 2014-08-13 ENCOUNTER — Ambulatory Visit (INDEPENDENT_AMBULATORY_CARE_PROVIDER_SITE_OTHER): Payer: Medicare Other | Admitting: Diagnostic Neuroimaging

## 2014-08-13 ENCOUNTER — Encounter: Payer: Self-pay | Admitting: Diagnostic Neuroimaging

## 2014-08-13 VITALS — BP 139/90 | HR 82 | Wt 132.0 lb

## 2014-08-13 DIAGNOSIS — G20 Parkinson's disease: Secondary | ICD-10-CM

## 2014-08-13 DIAGNOSIS — G250 Essential tremor: Secondary | ICD-10-CM

## 2014-08-13 MED ORDER — PRIMIDONE 50 MG PO TABS: 100.0000 mg | ORAL_TABLET | Freq: Three times a day (TID) | ORAL | Status: DC

## 2014-08-13 NOTE — Progress Notes (Signed)
[facility]  PATIENT: April Church DOB: 1940/10/09  REFERRING CLINICIAN:  HISTORY FROM: patient REASON FOR VISIT: follow up   Chief Complaint  Patient presents with  . Tremors    rm 6,  Cervical Spondylosis without myelopathy  . Follow-up    HISTORICAL  CHIEF COMPLAINT:  Chief Complaint  Patient presents with  . Tremors    rm 6,  Cervical Spondylosis without myelopathy  . Follow-up    HISTORY OF PRESENT ILLNESS:   UPDATE 08/13/14: Since last visit, tried primidone 250mg  BID, but not much tremor benefit; also having more sleepiness in the day time. Still with hand > voice > chin tremor. No falls. Balance getting slightly worse.  UPDATE 05/22/14: Since last visit, tremor progressing. Now notes new resting tremor in the last year. Some balance and walking issues. She is s/p cervical decompression in Sept 2015, with good results.   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 Her tremor does not limit any of her activities of daily life. She denies any side effects of the Mysoline. She also reports today that she's had some numbness and tingling down her right arm from her neck and shoulder. It has been bothering her more in the last 6 months. 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 She saw Dr. Arnoldo Morale back in 2004. She returns for reevaluation  UPDATE 06/07/12 (VRP): Doing well. BUE tremor is stable and controlled. Voice tremor is slightly progressing. Tolerating primidone.  PRIOR HPI (06/09/11, VRP):  74 yrs old female returns for F/U. Last seen 06/09/10.  She has been followed in this office for many years for essential tremor. She takes primidone 50 mg, 1.5 TID. She tolerates it well. She comes in today saying that her tremor is a little worse before lunch.  The tremor limits her a little bit with eating and writing but for  the most part she is happy with her level of function.She has hx of  breast cancer.  No neurologic complaints.   REVIEW OF SYSTEMS: Full 14 system review of systems performed and notable only for tremor fatigue wheezing joint pain.  ALLERGIES: Allergies  Allergen Reactions  . Shellfish Allergy Shortness Of Breath  . Sulfa Drugs Cross Reactors Anxiety and Rash  . Codeine Anxiety    HOME MEDICATIONS: Outpatient Prescriptions Prior to Visit  Medication Sig Dispense Refill  . amLODipine (NORVASC) 5 MG tablet Take 5 mg by mouth daily.    [name] anastrozole (ARIMIDEX) 1 MG tablet Take 1 tablet (1 mg total) by mouth daily. 90 tablet 2  . aspirin 81 MG tablet Take 81 mg by mouth daily.      . beclomethasone (QVAR) 80 MCG/ACT inhaler Inhale 1 puff into the lungs 2 (two) times daily.     [name] CALCIUM CARBONATE PO Take 1 tablet by mouth 2 (two) times daily.    . Cholecalciferol (VITAMIN D) 1000 UNITS capsule Take 1,000 Units by mouth 2 (two) times daily.     . diphenhydrAMINE (BENADRYL) 25 MG tablet Take 25 mg by mouth at bedtime as needed for itching.    . docusate sodium 100 MG CAPS Take 100 mg by mouth 2 (two) times daily. 60 capsule 0  . fluticasone (FLONASE) 50 MCG/ACT nasal spray Place 2 sprays into the nose daily.      . Omega-3 Fatty Acids (FISH OIL) 1200 MG CAPS  Take 1,200 mg by mouth daily.     [name] oxyCODONE-acetaminophen (PERCOCET/ROXICET) 5-325 MG per tablet Take 1-2 tablets by mouth every 4 (four) hours as needed for moderate pain. 100 tablet 0  . primidone (MYSOLINE) 250 MG tablet Take 1 tablet (250 mg total) by mouth 2 (two) times daily. 60 tablet 12  . ranitidine (ZANTAC) 150 MG capsule Take 150 mg by mouth daily after breakfast.     . triamcinolone cream (KENALOG) 0.1 % Apply 1 application topically 2 (two) times daily.    [name] venlafaxine (EFFEXOR) 37.5 MG tablet Take 1 tablet (37.5 mg total) by mouth daily. 90 tablet 3   No facility-administered medications prior to visit.    PAST  MEDICAL HISTORY: Past Medical History  Diagnosis Date  . Tremors of nervous system     hands  . GERD (gastroesophageal reflux disease)   . 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 Cyst of finger 11/2011    annular cyst right long finger  . Hypertension     under control, has been on med. x 2 yrs.  . Dental crowns present   . Asthma     daily inhaler  . Hyperlipidemia   . PONV (postoperative nausea and vomiting)   . Frequency of urination   . Breast cancer     left  . Seasonal allergies   . Dermatitis     PAST SURGICAL HISTORY: Past Surgical History  Procedure Laterality Date  . Nasal sinus surgery      x 2  . Foot surgery  06/22/11    left  . Knee arthroscopy  03/12/2005    right  . Nephrectomy living donor Left 04/2008  . Appendectomy  age 27  . Tumor excision  age 13    right arm  . Cataract extraction, bilateral    . Breast surgery  1999    reduction  . Breast lumpectomy  06/16/2009    left; SLN bx.  . Breast lumpectomy  07/01/2009    re-excision  . Knee arthroscopy      left  . Trigger finger release  12/16/2011    Procedure: RELEASE TRIGGER FINGER/A-1 PULLEY;  Surgeon: Tennis Must, MD;  Location: [facility];  Service: Orthopedics;  Laterality: Right;  RIGHT LONG FINGER TRIGGER RELEASE & ANNULAR CYST EXCISION  . Anterior cervical decompression/discectomy fusion 4 levels N/A 11/14/2013    Procedure: ANTERIOR CERVICAL DECOMPRESSION/DISCECTOMY FUSION 4 LEVELS;  Surgeon: Newman Pies, MD;  Location: [facility];  Service: Neurosurgery;  Laterality: N/A;  C34 C45 C56 C67 anterior cervical fusion with interbody prosthesis plating and  bonegraft    FAMILY HISTORY: Family History  Problem Relation Age of Onset  . Kidney disease Mother   . Stroke Father   . Diabetes Brother   . Heart disease Brother   . Cancer Sister     SOCIAL HISTORY:  History   Social History  . Marital Status: Married    Spouse Name: Marcello Moores   . Number of  Children: 1  . Years of Education: HS   Occupational History  . Retired    Social History Main Topics  . Smoking status: Never Smoker   . Smokeless tobacco: Never Used  . Alcohol Use: No  . Drug Use: No  . Sexual Activity: Yes   Other Topics Concern  . Not on file   Social History Narrative      Pt lives at home with her  spouse.  [name]    Patient has 1 child.    Patient is retired.    Patient has a HS education.    Patient is right handed.    Patient drinks caffeine occasionally.                PHYSICAL EXAM  Filed Vitals:   08/13/14 1133  BP: 139/90  Pulse: 82  Weight: 132 lb (59.875 kg)   Body mass index is 24.95 kg/(m^2).  GENERAL EXAM: Patient is in no distress  CARDIOVASCULAR: Regular rate and rhythm, no murmurs, no carotid bruits  NEUROLOGIC: MENTAL STATUS: awake, alert, language fluent, comprehension intact, naming intact; MASKED FACIES; NEG FRONTAL RELEASE SIGNS CRANIAL NERVE: pupils equal and reactive to light, visual fields full to confrontation, extraocular muscles intact, no nystagmus, facial sensation and strength symmetric, uvula midline, shoulder shrug symmetric, tongue midline; VOICE TREMOR. MOTOR: normal bulk, POSTURAL AND ACTION TREMOR; INT REST TREMOR; BRADYKINESIA IN LUE > RUE; BRADYKINESIA IN LLE > RLE; NO RIGIDITY; full strength in the BUE, BLE SENSORY: normal and symmetric to light touch COORDINATION: finger-nose-finger, fine finger movements normal REFLEXES: deep tendon reflexes present and symmetric GAIT/STATION: narrow based gait; able to tandem; romberg is negative; SLOW AND CAUTIOUS, SLIGHTLY DECR ARM SWING    DIAGNOSTIC DATA (LABS, IMAGING, TESTING) - I reviewed patient records, labs, notes, testing and imaging myself where available.  Lab Results  Component Value Date   WBC 5.8 01/21/2014   HGB 10.5* 01/21/2014   HCT 32.7* 01/21/2014   MCV 87.1 01/21/2014   PLT 384 01/21/2014      Component Value Date/Time   NA 137  01/21/2014 1341   NA 134* 11/14/2013 1309   K 4.2 01/21/2014 1341   K 4.1 11/14/2013 1309   CL 100 11/06/2013 1032   CL 102 06/30/2012 1231   CO2 26 01/21/2014 1341   CO2 27 11/06/2013 1032   GLUCOSE 82 01/21/2014 1341   GLUCOSE 176* 11/14/2013 1309   GLUCOSE 77 06/30/2012 1231   BUN 11.0 01/21/2014 1341   BUN 13 11/06/2013 1032   CREATININE 0.8 01/21/2014 1341   CREATININE 0.73 11/06/2013 1032   CALCIUM 9.2 01/21/2014 1341   CALCIUM 9.6 11/06/2013 1032   PROT 7.6 01/21/2014 1341   PROT 6.7 05/21/2011 1423   ALBUMIN 3.9 01/21/2014 1341   ALBUMIN 4.0 05/21/2011 1423   AST 19 01/21/2014 1341   AST 18 05/21/2011 1423   ALT 13 01/21/2014 1341   ALT 10 05/21/2011 1423   ALKPHOS 89 01/21/2014 1341   ALKPHOS 44 05/21/2011 1423   BILITOT <0.20 01/21/2014 1341   BILITOT 0.2* 05/21/2011 1423   GFRNONAA 83* 11/06/2013 1032   GFRAA >90 11/06/2013 1032   No results found for: CHOL No results found for: HGBA1C No results found for: VITAMINB12 No results found for: TSH   06/25/13 EMG/NCS 1. 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 2. No evidence of widespread underlying large fiber neuropathy.   ASSESSMENT AND PLAN  74 y.o. year old female  has a past medical history of Tremors of nervous system; GERD (gastroesophageal reflux disease); Trigger finger of right hand (11/2011); Cyst of finger (11/2011); Hypertension; Dental crowns present; Asthma; Hyperlipidemia; PONV (postoperative nausea and vomiting); Frequency of urination; Breast cancer; Seasonal allergies; and Dermatitis. here with essential tremor since 1990's. Now with intermittent resting tremor AND bradykinesia since 2016. May represent essential tremor with superimposed (parkinson's  disease vs cervical myelopathy sequelae).   Dx: essential tremor + possible parkinsonism vs cervical myelopathy sequelae   PLAN: I  spent 15 minutes of face to face time with patient. Greater than 50% of time was spent in counseling and coordination of care with patient. In summary we discussed:  1. Reduce primidone back to 100mg  TID (due to ceiling effect of benefit and too many side effects at 250mg  BID) 2. Caution with walking and balance 3. In future may consider gabapentin or topiramate for tremor control; will hold off on carb/levo for now  Return in about 6 months (around 02/12/2015).     Penni Bombard, MD 1/61/0960, 45:40 PM Certified in Neurology, Neurophysiology and Neuroimaging  [facility] [address] [place], [place] [address] [phone]

## 2014-08-13 NOTE — Patient Instructions (Signed)
Reduce primidone to 100mg  three times a day.

## 2014-08-15 DIAGNOSIS — C50919 Malignant neoplasm of unspecified site of unspecified female breast: Secondary | ICD-10-CM | POA: Diagnosis not present

## 2014-08-15 DIAGNOSIS — Z124 Encounter for screening for malignant neoplasm of cervix: Secondary | ICD-10-CM | POA: Diagnosis not present

## 2014-08-15 DIAGNOSIS — Z01419 Encounter for gynecological examination (general) (routine) without abnormal findings: Secondary | ICD-10-CM | POA: Diagnosis not present

## 2014-08-15 DIAGNOSIS — Z8041 Family history of malignant neoplasm of ovary: Secondary | ICD-10-CM | POA: Diagnosis not present

## 2014-08-15 DIAGNOSIS — D259 Leiomyoma of uterus, unspecified: Secondary | ICD-10-CM | POA: Diagnosis not present

## 2014-08-15 DIAGNOSIS — R300 Dysuria: Secondary | ICD-10-CM | POA: Diagnosis not present

## 2014-09-06 DIAGNOSIS — M542 Cervicalgia: Secondary | ICD-10-CM | POA: Diagnosis not present

## 2014-10-02 ENCOUNTER — Other Ambulatory Visit: Payer: Self-pay | Admitting: Gastroenterology

## 2014-11-17 DIAGNOSIS — R42 Dizziness and giddiness: Secondary | ICD-10-CM | POA: Diagnosis not present

## 2014-11-17 DIAGNOSIS — I10 Essential (primary) hypertension: Secondary | ICD-10-CM | POA: Diagnosis not present

## 2014-11-17 DIAGNOSIS — R8290 Unspecified abnormal findings in urine: Secondary | ICD-10-CM | POA: Diagnosis not present

## 2014-11-20 DIAGNOSIS — I10 Essential (primary) hypertension: Secondary | ICD-10-CM | POA: Diagnosis not present

## 2014-11-20 DIAGNOSIS — R42 Dizziness and giddiness: Secondary | ICD-10-CM | POA: Diagnosis not present

## 2014-11-20 DIAGNOSIS — Z23 Encounter for immunization: Secondary | ICD-10-CM | POA: Diagnosis not present

## 2014-12-02 ENCOUNTER — Other Ambulatory Visit: Payer: Self-pay | Admitting: Gastroenterology

## 2014-12-02 ENCOUNTER — Encounter (HOSPITAL_COMMUNITY): Payer: Self-pay | Admitting: *Deleted

## 2014-12-03 ENCOUNTER — Ambulatory Visit (HOSPITAL_COMMUNITY)
Admission: RE | Admit: 2014-12-03 | Discharge: 2014-12-03 | Disposition: A | Discharge status: Home / Self Care | Payer: Medicare Other | Source: Ambulatory Visit | Attending: Gastroenterology | Admitting: Gastroenterology

## 2014-12-03 ENCOUNTER — Ambulatory Visit (HOSPITAL_COMMUNITY): Admit: 2014-12-03 | Payer: Self-pay | Admitting: Gastroenterology

## 2014-12-03 ENCOUNTER — Encounter (HOSPITAL_COMMUNITY): Payer: Self-pay

## 2014-12-03 ENCOUNTER — Ambulatory Visit (HOSPITAL_COMMUNITY): Payer: Medicare Other | Admitting: Anesthesiology

## 2014-12-03 ENCOUNTER — Encounter (HOSPITAL_COMMUNITY)
Admission: RE | Disposition: A | Discharge status: Home / Self Care | Payer: Self-pay | Source: Ambulatory Visit | Attending: Gastroenterology

## 2014-12-03 ENCOUNTER — Other Ambulatory Visit: Payer: Self-pay | Admitting: Gastroenterology

## 2014-12-03 DIAGNOSIS — J45909 Unspecified asthma, uncomplicated: Secondary | ICD-10-CM | POA: Diagnosis not present

## 2014-12-03 DIAGNOSIS — I10 Essential (primary) hypertension: Secondary | ICD-10-CM | POA: Insufficient documentation

## 2014-12-03 DIAGNOSIS — Z9849 Cataract extraction status, unspecified eye: Secondary | ICD-10-CM | POA: Insufficient documentation

## 2014-12-03 DIAGNOSIS — Z86010 Personal history of colonic polyps: Secondary | ICD-10-CM | POA: Diagnosis not present

## 2014-12-03 DIAGNOSIS — Z1211 Encounter for screening for malignant neoplasm of colon: Secondary | ICD-10-CM | POA: Diagnosis not present

## 2014-12-03 DIAGNOSIS — Z524 Kidney donor: Secondary | ICD-10-CM | POA: Diagnosis not present

## 2014-12-03 DIAGNOSIS — Z853 Personal history of malignant neoplasm of breast: Secondary | ICD-10-CM | POA: Diagnosis not present

## 2014-12-03 DIAGNOSIS — E7800 Pure hypercholesterolemia, unspecified: Secondary | ICD-10-CM | POA: Insufficient documentation

## 2014-12-03 DIAGNOSIS — K921 Melena: Secondary | ICD-10-CM | POA: Diagnosis not present

## 2014-12-03 HISTORY — PX: COLONOSCOPY WITH PROPOFOL: SHX5780

## 2014-12-03 HISTORY — DX: Unspecified hemorrhoids: K64.9

## 2014-12-03 SURGERY — COLONOSCOPY WITH PROPOFOL
Anesthesia: Monitor Anesthesia Care

## 2014-12-03 MED ORDER — LACTATED RINGERS IV SOLN
INTRAVENOUS | Status: DC
  Administered 2014-12-03: 1000 mL via INTRAVENOUS

## 2014-12-03 MED ORDER — PROPOFOL 10 MG/ML IV BOLUS
INTRAVENOUS | Status: AC
Start: 2014-12-03 — End: 2014-12-03
  Filled 2014-12-03: qty 20

## 2014-12-03 MED ORDER — PROPOFOL 500 MG/50ML IV EMUL
INTRAVENOUS | Status: DC | PRN
  Administered 2014-12-03: 300 ug/kg/min via INTRAVENOUS

## 2014-12-03 MED ORDER — ONDANSETRON HCL 4 MG/2ML IJ SOLN
INTRAMUSCULAR | Status: DC | PRN
  Administered 2014-12-03: 4 mg via INTRAVENOUS

## 2014-12-03 MED ORDER — PROPOFOL 10 MG/ML IV BOLUS
INTRAVENOUS | Status: AC
  Filled 2014-12-03: qty 20

## 2014-12-03 MED ORDER — ONDANSETRON HCL 4 MG/2ML IJ SOLN
INTRAMUSCULAR | Status: AC
  Filled 2014-12-03: qty 2

## 2014-12-03 SURGICAL SUPPLY — 22 items

## 2014-12-03 NOTE — Transfer of Care (Signed)
Immediate Anesthesia Transfer of Care Note  Patient: April Church  Procedure(s) Performed: Procedure(s): COLONOSCOPY WITH PROPOFOL (N/A)  Patient Location: PACU  Anesthesia Type:MAC  Level of Consciousness:  sedated, patient cooperative and responds to stimulation  Airway & Oxygen Therapy:Patient Spontanous Breathing and Patient connected to face mask oxgen  Post-op Assessment:  Report given to PACU RN and Post -op Vital signs reviewed and stable  Post vital signs:  Reviewed and stable  Last Vitals:  Filed Vitals:   12/03/14 0637  BP: 146/85  Pulse: 82  Temp: 36.4 C  Resp: 16    Complications: No apparent anesthesia complications

## 2014-12-03 NOTE — H&P (Signed)
  Procedure: Surveillance colonoscopy. 10/27/2010 colonoscopy performed with removal of a 4 mm ascending colon tubular adenomatous polyp. 05/11/2007 colonoscopy performed with removal of a 4 mm cecal adenomatous polyp. Intermittent painless small-volume hematochezia.  History: The patient is a 74 year old female born [birth year]. She has chronic, intermittent painless hematochezia and a history of adenomatous colon polyps removed colonoscopically in the past. She is scheduled to undergo surveillance colonoscopy today.  Past medical history: Hypercholesterolemia. Cervical spinal stenosis. Asthma. Breast cancer diagnosed in April 2011. Hypertension. Shingles at age 56. Hypercholesterolemia. Bilateral knee arthroscopy. Sinus surgery. Cataract surgery. 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 Appendectomy. Tubal ligation. Breast reduction surgery. Breast cyst aspiration. Lumpectomy of the breast. Left foot surgery. Kidney donor in 2010.  Medication allergies: Codeine. Sulfa. Lisinopril.  Exam: The patient is alert and lying comfortably on the endoscopy stretcher. Abdomen is soft and nontender to palpation. Lungs are clear to auscultation. Cardiac exam reveals a regular rhythm.  Plan: Proceed with surveillance colonoscopy

## 2014-12-03 NOTE — Anesthesia Postprocedure Evaluation (Signed)
  Anesthesia Post-op Note  Patient: April Church  Procedure(s) Performed: Procedure(s) (LRB): COLONOSCOPY WITH PROPOFOL (N/A)  Patient Location: PACU  Anesthesia Type: MAC  Level of Consciousness: awake and alert   Airway and Oxygen Therapy: Patient Spontanous Breathing  Post-op Pain: mild  Post-op Assessment: Post-op Vital signs reviewed, Patient's Cardiovascular Status Stable, Respiratory Function Stable, Patent Airway and No signs of Nausea or vomiting  Last Vitals:  Filed Vitals:   12/03/14 0854  BP: 89/65  Pulse: 78  Temp:   Resp: 18    Post-op Vital Signs: stable   Complications: No apparent anesthesia complications

## 2014-12-03 NOTE — Anesthesia Preprocedure Evaluation (Signed)
Anesthesia Evaluation  Patient identified by MRN, date of birth, ID band Patient awake    Reviewed: Allergy & Precautions, NPO status , Patient's Chart, lab work & pertinent test results  Airway Mallampati: II  TM Distance: >3 FB Neck ROM: Full    Dental no notable dental hx.    Pulmonary asthma ,    Pulmonary exam normal breath sounds clear to auscultation       Cardiovascular hypertension, Normal cardiovascular exam Rhythm:Regular Rate:Normal     Neuro/Psych negative neurological ROS  negative psych ROS   GI/Hepatic negative GI ROS, Neg liver ROS,   Endo/Other  negative endocrine ROS  Renal/GU negative Renal ROS  negative genitourinary   Musculoskeletal negative musculoskeletal ROS (+)   Abdominal   Peds negative pediatric ROS (+)  Hematology negative hematology ROS (+)   Anesthesia Other Findings   Reproductive/Obstetrics negative OB ROS                             Anesthesia Physical Anesthesia Plan  ASA: II  Anesthesia Plan: MAC   Post-op Pain Management:    Induction: Intravenous  Airway Management Planned: Nasal Cannula  Additional Equipment:   Intra-op Plan:   Post-operative Plan:   Informed Consent: I have reviewed the patients History and Physical, chart, labs and discussed the procedure including the risks, benefits and alternatives for the proposed anesthesia with the patient or authorized representative who has indicated his/her understanding and acceptance.   Dental advisory given  Plan Discussed with: CRNA and Surgeon  Anesthesia Plan Comments:         Anesthesia Quick Evaluation

## 2014-12-03 NOTE — Op Note (Signed)
Procedure: Surveillance colonoscopy. Chronic, intermittent painless small-volume hematochezia. 10/27/2010 colonoscopy performed with removal of a 4 mm ascending colon tubular adenomatous polyp. 05/11/2007 colonoscopy performed with removal of a 4 mm cecal adenomatous polyp.  Endoscopist: Earle Gell  Premedication: Propofol administered by anesthesia  Procedure: The patient was placed in the left lateral decubitus position. Anal inspection and digital rectal exam were normal. The Pentax pediatric colonoscope was introduced into the rectum and advanced to the cecum. A normal-appearing ileocecal valve and appendiceal orifice were identified. Colonic preparation for the exam today was good. Withdrawal time was 10 minutes  Rectum. Normal. Retroflexed view of the distal rectum was normal  Sigmoid colon and descending colon. Normal  Splenic flexure. Normal  Transverse colon. Normal  Hepatic flexure. Normal  Ascending colon. Normal  Cecum and ileocecal valve. Normal  Assessment: Normal surveillance colonoscopy

## 2014-12-03 NOTE — Discharge Instructions (Signed)

## 2014-12-04 ENCOUNTER — Encounter (HOSPITAL_COMMUNITY): Payer: Self-pay | Admitting: Gastroenterology

## 2014-12-18 DIAGNOSIS — T50995A Adverse effect of other drugs, medicaments and biological substances, initial encounter: Secondary | ICD-10-CM | POA: Diagnosis not present

## 2014-12-18 DIAGNOSIS — J309 Allergic rhinitis, unspecified: Secondary | ICD-10-CM | POA: Diagnosis not present

## 2014-12-18 DIAGNOSIS — T782XXD Anaphylactic shock, unspecified, subsequent encounter: Secondary | ICD-10-CM | POA: Diagnosis not present

## 2014-12-18 DIAGNOSIS — Z79899 Other long term (current) drug therapy: Secondary | ICD-10-CM | POA: Diagnosis not present

## 2014-12-18 DIAGNOSIS — J4530 Mild persistent asthma, uncomplicated: Secondary | ICD-10-CM | POA: Diagnosis not present

## 2014-12-18 DIAGNOSIS — Z7951 Long term (current) use of inhaled steroids: Secondary | ICD-10-CM | POA: Diagnosis not present

## 2014-12-23 DIAGNOSIS — S76919A Strain of unspecified muscles, fascia and tendons at thigh level, unspecified thigh, initial encounter: Secondary | ICD-10-CM | POA: Diagnosis not present

## 2014-12-30 ENCOUNTER — Other Ambulatory Visit: Payer: Self-pay | Admitting: Internal Medicine

## 2014-12-30 ENCOUNTER — Ambulatory Visit
Admission: RE | Admit: 2014-12-30 | Discharge: 2014-12-30 | Disposition: A | Discharge status: Home / Self Care | Payer: Medicare Other | Source: Ambulatory Visit | Attending: Internal Medicine | Admitting: Internal Medicine

## 2014-12-30 DIAGNOSIS — E7800 Pure hypercholesterolemia, unspecified: Secondary | ICD-10-CM | POA: Diagnosis not present

## 2014-12-30 DIAGNOSIS — I10 Essential (primary) hypertension: Secondary | ICD-10-CM | POA: Diagnosis not present

## 2014-12-30 DIAGNOSIS — J4530 Mild persistent asthma, uncomplicated: Secondary | ICD-10-CM | POA: Diagnosis not present

## 2014-12-30 DIAGNOSIS — G250 Essential tremor: Secondary | ICD-10-CM | POA: Diagnosis not present

## 2014-12-30 DIAGNOSIS — M7989 Other specified soft tissue disorders: Secondary | ICD-10-CM

## 2015-01-07 DIAGNOSIS — M79662 Pain in left lower leg: Secondary | ICD-10-CM | POA: Diagnosis not present

## 2015-01-07 DIAGNOSIS — M1712 Unilateral primary osteoarthritis, left knee: Secondary | ICD-10-CM | POA: Diagnosis not present

## 2015-01-20 ENCOUNTER — Encounter: Payer: Self-pay | Admitting: Hematology and Oncology

## 2015-01-20 ENCOUNTER — Other Ambulatory Visit (HOSPITAL_BASED_OUTPATIENT_CLINIC_OR_DEPARTMENT_OTHER): Payer: Medicare Other

## 2015-01-20 ENCOUNTER — Telehealth: Payer: Self-pay | Admitting: Nurse Practitioner

## 2015-01-20 ENCOUNTER — Ambulatory Visit (HOSPITAL_BASED_OUTPATIENT_CLINIC_OR_DEPARTMENT_OTHER): Payer: Medicare Other | Admitting: Hematology and Oncology

## 2015-01-20 VITALS — BP 134/84 | HR 81 | Temp 97.7°F | Resp 18 | Ht 61.0 in | Wt 132.4 lb

## 2015-01-20 DIAGNOSIS — Z170 Estrogen receptor positive status [ER+]: Secondary | ICD-10-CM

## 2015-01-20 DIAGNOSIS — C50919 Malignant neoplasm of unspecified site of unspecified female breast: Secondary | ICD-10-CM

## 2015-01-20 DIAGNOSIS — C50512 Malignant neoplasm of lower-outer quadrant of left female breast: Secondary | ICD-10-CM

## 2015-01-20 LAB — CBC WITH DIFFERENTIAL/PLATELET
BASO%: 0.8 % (ref 0.0–2.0)
Basophils Absolute: 0 10*3/uL (ref 0.0–0.1)
EOS%: 5.9 % (ref 0.0–7.0)
Eosinophils Absolute: 0.3 10*3/uL (ref 0.0–0.5)
HCT: 36.1 % (ref 34.8–46.6)
HGB: 12 g/dL (ref 11.6–15.9)
LYMPH%: 32.6 % (ref 14.0–49.7)
MCH: 29.3 pg (ref 25.1–34.0)
MCHC: 33.1 g/dL (ref 31.5–36.0)
MCV: 88.4 fL (ref 79.5–101.0)
MONO#: 0.6 10*3/uL (ref 0.1–0.9)
MONO%: 11.4 % (ref 0.0–14.0)
NEUT#: 2.6 10*3/uL (ref 1.5–6.5)
NEUT%: 49.3 % (ref 38.4–76.8)
Platelets: 293 10*3/uL (ref 145–400)
RBC: 4.08 10*6/uL (ref 3.70–5.45)
RDW: 13.9 % (ref 11.2–14.5)
WBC: 5.2 10*3/uL (ref 3.9–10.3)
lymph#: 1.7 10*3/uL (ref 0.9–3.3)

## 2015-01-20 LAB — COMPREHENSIVE METABOLIC PANEL (CC13)
ALT: 13 U/L (ref 0–55)
AST: 17 U/L (ref 5–34)
Albumin: 3.6 g/dL (ref 3.5–5.0)
Alkaline Phosphatase: 86 U/L (ref 40–150)
Anion Gap: 8 mEq/L (ref 3–11)
BUN: 13.9 mg/dL (ref 7.0–26.0)
CO2: 27 mEq/L (ref 22–29)
Calcium: 8.8 mg/dL (ref 8.4–10.4)
Chloride: 101 mEq/L (ref 98–109)
Creatinine: 0.9 mg/dL (ref 0.6–1.1)
EGFR: 66 mL/min/{1.73_m2} — ABNORMAL LOW (ref >90)
Glucose: 66 mg/dl — ABNORMAL LOW (ref 70–140)
Potassium: 4.1 mEq/L (ref 3.5–5.1)
Sodium: 136 mEq/L (ref 136–145)
Total Bilirubin: <0.3 mg/dL (ref 0.20–1.20)
Total Protein: 7.4 g/dL (ref 6.4–8.3)

## 2015-01-20 MED ORDER — AMLODIPINE BESYLATE 5 MG PO TABS
5.0000 mg | ORAL_TABLET | Freq: Two times a day (BID) | ORAL | Status: DC
Start: 2015-01-20 — End: 2020-05-12

## 2015-01-20 MED ORDER — VENLAFAXINE HCL 37.5 MG PO TABS: 37.5000 mg | ORAL_TABLET | Freq: Every day | ORAL | Status: DC

## 2015-01-20 NOTE — Telephone Encounter (Signed)
Gave and printed appt sched and avs for pt for NOV 2017 °

## 2015-01-20 NOTE — Progress Notes (Signed)
Patient Care Team: April Dawson, MD as PCP - General (Internal Medicine)  DIAGNOSIS: No matching staging information was found for the patient.  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   07/23/2009 - 09/03/2009 Radiation Therapy Adjuvant radiation therapy   10/13/2009 - 10/15/2013 Anti-estrogen oral therapy Initially letrozole 2.5 mg daily 2 years then switched to tamoxifen then once again switched the Arimidex October 2013    CHIEF COMPLIANT: completed antiestrogen therapy  INTERVAL HISTORY: April Church is a 74 year old with above-mentioned history left breast cancer treated with lumpectomy radiation in this completed 5 years of oral antiestrogen therapy. She reports that the myalgias have completely resolved since stopping this treatment in August. She reports no new health issues. Her blood pressure is now under better control.  REVIEW OF SYSTEMS:   Constitutional: Denies fevers, chills or abnormal weight loss Eyes: Denies blurriness of vision Ears, nose, mouth, throat, and face: Denies mucositis or sore throat Respiratory: Denies cough, dyspnea or wheezes Cardiovascular: Denies palpitation, chest discomfort or lower extremity swelling Gastrointestinal:  Denies nausea, heartburn or change in bowel habits Skin: Denies abnormal skin rashes Lymphatics: Denies new lymphadenopathy or easy bruising Neurological:Denies numbness, tingling or new weaknesses Behavioral/Psych: Mood is stable, no new changes  Breast:  denies any pain or lumps or nodules in either breasts All other systems were reviewed with the patient and are negative.  I have reviewed the past medical history, past surgical history, social history and family history with the patient and they are unchanged from previous  note.  ALLERGIES:  is allergic to shellfish allergy; sulfa drugs cross reactors; and codeine.  MEDICATIONS:  Current Outpatient Prescriptions  Medication Sig Dispense Refill  . albuterol (PROVENTIL HFA;VENTOLIN HFA) 108 (90 BASE) MCG/ACT inhaler Inhale 1 puff into the lungs every 6 (six) hours as needed for wheezing or shortness of breath.    [name] amLODipine (NORVASC) 5 MG tablet Take 1 tablet (5 mg total) by mouth 2 (two) times daily.    [name] aspirin 81 MG tablet Take 81 mg by mouth daily.      . beclomethasone (QVAR) 80 MCG/ACT inhaler Inhale 1 puff into the lungs 2 (two) times daily.     [name] CALCIUM CARBONATE PO Take 1 tablet by mouth 2 (two) times daily.    . Cholecalciferol (VITAMIN D) 1000 UNITS capsule Take 1,000 Units by mouth 2 (two) times daily.     . fexofenadine (ALLEGRA) 180 MG tablet Take 180 mg by mouth daily.    . fluticasone (FLONASE) 50 MCG/ACT nasal spray Place 1 spray into the nose 2 (two) times daily.     [name] ipratropium (ATROVENT) 0.03 % nasal spray Place 2 sprays into both nostrils 2 (two) times daily.    . Omega-3 Fatty Acids (FISH OIL) 1200 MG CAPS Take 1,200 mg by mouth 2 (two) times daily.     Vladimir Faster Glycol-Propyl Glycol (SYSTANE OP) Place 1 drop into both eyes 2 (two) times daily.    . primidone (MYSOLINE) 50 MG tablet Take 2 tablets (100 mg total) by mouth 3 (three) times daily. 540 tablet 4  . ranitidine (ZANTAC) 150 MG capsule Take 150 mg by mouth daily after breakfast.     . triamcinolone cream (KENALOG) 0.1 % Apply 1 application topically 2 (two) times daily as needed (FOR RASH).     [name]  venlafaxine (EFFEXOR) 37.5 MG tablet Take 1 tablet (37.5 mg total) by mouth daily. 90 tablet 3   No current facility-administered medications for this visit.    PHYSICAL EXAMINATION: ECOG PERFORMANCE STATUS: 0 - Asymptomatic  Filed Vitals:   01/20/15 1335  BP: 134/84  Pulse: 81  Temp: 97.7 F (36.5 C)  Resp: 18   Filed Weights   01/20/15 1335  Weight: 132 lb 6.4 oz  (60.056 kg)    GENERAL:alert, no distress and comfortable SKIN: skin color, texture, turgor are normal, no rashes or significant lesions EYES: normal, Conjunctiva are pink and non-injected, sclera clear OROPHARYNX:no exudate, no erythema and lips, buccal mucosa, and tongue normal  NECK: supple, thyroid normal size, non-tender, without nodularity LYMPH:  no palpable lymphadenopathy in the cervical, axillary or inguinal LUNGS: clear to auscultation and percussion with normal breathing effort HEART: regular rate & rhythm and no murmurs and no lower extremity edema ABDOMEN:abdomen soft, non-tender and normal bowel sounds Musculoskeletal:no cyanosis of digits and no clubbing  NEURO: alert & oriented x 3 with fluent speech, no focal motor/sensory deficits BREAST: No palpable masses or nodules in either right or left breasts. No palpable axillary supraclavicular or infraclavicular adenopathy no breast tenderness or nipple discharge. (exam performed in the presence of a chaperone)  LABORATORY DATA:  I have reviewed the data as listed   Chemistry      Component Value Date/Time   NA 137 01/21/2014 1341   NA 134* 11/14/2013 1309   K 4.2 01/21/2014 1341   K 4.1 11/14/2013 1309   CL 100 11/06/2013 1032   CL 102 06/30/2012 1231   CO2 26 01/21/2014 1341   CO2 27 11/06/2013 1032   BUN 11.0 01/21/2014 1341   BUN 13 11/06/2013 1032   CREATININE 0.8 01/21/2014 1341   CREATININE 0.73 11/06/2013 1032      Component Value Date/Time   CALCIUM 9.2 01/21/2014 1341   CALCIUM 9.6 11/06/2013 1032   ALKPHOS 89 01/21/2014 1341   ALKPHOS 44 05/21/2011 1423   AST 19 01/21/2014 1341   AST 18 05/21/2011 1423   ALT 13 01/21/2014 1341   ALT 10 05/21/2011 1423   BILITOT <0.20 01/21/2014 1341   BILITOT 0.2* 05/21/2011 1423       Lab Results  Component Value Date   WBC 5.2 01/20/2015   HGB 12.0 01/20/2015   HCT 36.1 01/20/2015   MCV 88.4 01/20/2015   PLT 293 01/20/2015   NEUTROABS 2.6 01/20/2015      RADIOGRAPHIC STUDIES: I have personally reviewed the radiology reports and agreed with their findings. 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  Breast Cancer Surveillance: 1. Breast exam  01/20/2015: Normal 2. Mammogram 03/18/14 No abnormalities. Postsurgical changes. Breast Density Category B. I recommended that she get 3-D mammograms for surveillance. Discussed the differences between different breast density categories. I sent a refill of Effexor to [facility] Return to clinic in 1 year for survivorship clinic  No orders of the defined types were placed in this encounter.   The patient has a good understanding of the overall plan. she agrees with it. she will call with any problems that may develop before the next visit here.   Rulon Eisenmenger,  MD 01/20/2015

## 2015-01-20 NOTE — Addendum Note (Signed)
Addended by: Prentiss Bells on: 01/20/2015 06:24 PM   Modules accepted: Medications

## 2015-01-20 NOTE — Assessment & Plan Note (Addendum)
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  Aromatase inhibitor toxicities: Patient is a following toxicities to Arimidex 1. Hot flashes 2. Depression 3. Mood changes  Breast Cancer Surveillance: 1. Breast exam  01/20/2015: Normal 2. Mammogram 03/18/14 No abnormalities. Postsurgical changes. Breast Density Category B. I recommended that she get 3-D mammograms for surveillance. Discussed the differences between different breast density categories.  Return to clinic in 1 year for survivorship

## 2015-01-30 DIAGNOSIS — D692 Other nonthrombocytopenic purpura: Secondary | ICD-10-CM | POA: Diagnosis not present

## 2015-01-30 DIAGNOSIS — D1801 Hemangioma of skin and subcutaneous tissue: Secondary | ICD-10-CM | POA: Diagnosis not present

## 2015-01-30 DIAGNOSIS — L853 Xerosis cutis: Secondary | ICD-10-CM | POA: Diagnosis not present

## 2015-01-30 DIAGNOSIS — L814 Other melanin hyperpigmentation: Secondary | ICD-10-CM | POA: Diagnosis not present

## 2015-01-30 DIAGNOSIS — D2271 Melanocytic nevi of right lower limb, including hip: Secondary | ICD-10-CM | POA: Diagnosis not present

## 2015-01-30 DIAGNOSIS — L820 Inflamed seborrheic keratosis: Secondary | ICD-10-CM | POA: Diagnosis not present

## 2015-01-30 DIAGNOSIS — L821 Other seborrheic keratosis: Secondary | ICD-10-CM | POA: Diagnosis not present

## 2015-02-12 ENCOUNTER — Ambulatory Visit (INDEPENDENT_AMBULATORY_CARE_PROVIDER_SITE_OTHER): Payer: Medicare Other | Admitting: Diagnostic Neuroimaging

## 2015-02-12 ENCOUNTER — Encounter: Payer: Self-pay | Admitting: Diagnostic Neuroimaging

## 2015-02-12 VITALS — BP 129/85 | HR 95 | Ht 61.0 in | Wt 136.0 lb

## 2015-02-12 DIAGNOSIS — G250 Essential tremor: Secondary | ICD-10-CM

## 2015-02-12 DIAGNOSIS — G20 Parkinson's disease: Secondary | ICD-10-CM

## 2015-02-12 MED ORDER — PRIMIDONE 50 MG PO TABS: 100.0000 mg | ORAL_TABLET | Freq: Three times a day (TID) | ORAL | Status: DC

## 2015-02-12 MED ORDER — CARBIDOPA-LEVODOPA 25-100 MG PO TABS
1.0000 | ORAL_TABLET | Freq: Three times a day (TID) | ORAL | Status: DC
Start: 2015-02-12 — End: 2015-05-13

## 2015-02-12 NOTE — Progress Notes (Signed)
[facility]  PATIENT: April Church DOB: 08/27/1940  REFERRING CLINICIAN:  HISTORY FROM: patient REASON FOR VISIT: follow up   Chief Complaint  Patient presents with  . Essential tremor    rm 7  . Follow-up    6 month    HISTORICAL  CHIEF COMPLAINT:  Chief Complaint  Patient presents with  . Essential tremor    rm 7  . Follow-up    6 month    HISTORY OF PRESENT ILLNESS:   UPDATE 02/12/15: Since last visit, tremor is stable. More voice strangulation issues. Tolerating primidone 100mg  TID.  UPDATE 08/13/14: Since last visit, tried primidone 250mg  BID, but not much tremor benefit; also having more sleepiness in the day time. Still with hand > voice > chin tremor. No falls. Balance getting slightly worse.  UPDATE 05/22/14: Since last visit, tremor progressing. Now notes new resting tremor in the last year. Some balance and walking issues. She is s/p cervical decompression in Sept 2015, with good results.   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 Her tremor does not limit any of her activities of daily life. She denies any side effects of the Mysoline. She also reports today that she's had some numbness and tingling down her right arm from her neck and shoulder. It has been bothering her more in the last 6 months. 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 She saw Dr. Arnoldo Morale back in 2004. She returns for reevaluation  UPDATE 06/07/12 (VRP): Doing well. BUE tremor is stable and controlled. Voice tremor is slightly progressing. Tolerating primidone.  PRIOR HPI (06/09/11, VRP):  74 yrs old female returns for F/U. Last seen 06/09/10.  She has been followed in this office for many years for essential tremor. She takes primidone 50 mg, 1.5 TID. She tolerates it well. She comes in today saying that her tremor is a little worse  before lunch.  The tremor limits her a little bit with eating and writing but for the most part she is happy with her level of function.She has hx of  breast cancer.  No neurologic complaints.   REVIEW OF SYSTEMS: Full 14 system review of systems performed and notable only for tremor fatigue wheezing joint pain.  ALLERGIES: Allergies  Allergen Reactions  . Shellfish Allergy Shortness Of Breath  . Sulfa Drugs Cross Reactors Anxiety and Rash  . Codeine Anxiety    HOME MEDICATIONS: Outpatient Prescriptions Prior to Visit  Medication Sig Dispense Refill  . albuterol (PROVENTIL HFA;VENTOLIN HFA) 108 (90 BASE) MCG/ACT inhaler Inhale 1 puff into the lungs every 6 (six) hours as needed for wheezing or shortness of breath.    [name] amLODipine (NORVASC) 5 MG tablet Take 1 tablet (5 mg total) by mouth 2 (two) times daily.    [name] aspirin 81 MG tablet Take 81 mg by mouth daily.      . beclomethasone (QVAR) 80 MCG/ACT inhaler Inhale 1 puff into the lungs 2 (two) times daily.     [name] CALCIUM CARBONATE PO Take 1 tablet by mouth 2 (two) times daily.    . Cholecalciferol (VITAMIN D) 1000 UNITS capsule Take 1,000 Units by mouth 2 (two) times daily.     . fexofenadine (ALLEGRA) 180 MG tablet Take 180 mg by mouth daily.    . fluticasone (FLONASE) 50 MCG/ACT nasal spray Place 1 spray into the nose 2 (two)  times daily.     [name] ipratropium (ATROVENT) 0.03 % nasal spray Place 2 sprays into both nostrils 2 (two) times daily.    . Omega-3 Fatty Acids (FISH OIL) 1200 MG CAPS Take 1,200 mg by mouth 2 (two) times daily.     Vladimir Faster Glycol-Propyl Glycol (SYSTANE OP) Place 1 drop into both eyes 2 (two) times daily.    . primidone (MYSOLINE) 50 MG tablet Take 2 tablets (100 mg total) by mouth 3 (three) times daily. 540 tablet 4  . ranitidine (ZANTAC) 150 MG capsule Take 150 mg by mouth daily after breakfast.     . triamcinolone cream (KENALOG) 0.1 % Apply 1 application topically 2 (two) times daily as needed (FOR RASH).     [name]  venlafaxine (EFFEXOR) 37.5 MG tablet Take 1 tablet (37.5 mg total) by mouth daily. 90 tablet 3   No facility-administered medications prior to visit.    PAST MEDICAL HISTORY: Past Medical History  Diagnosis Date  . Tremors of nervous system     hands  . GERD (gastroesophageal reflux disease)   . 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 Cyst of finger 11/2011    annular cyst right long finger  . Hypertension     under control, has been on med. x 2 yrs.  . Dental crowns present   . Asthma     daily inhaler  . Hyperlipidemia   . PONV (postoperative nausea and vomiting)   . Frequency of urination   . Seasonal allergies   . Dermatitis   . Breast cancer ([place])     left- radiation and surgery -dx. 2011- no further tx. now- [name] , [name]  . Hemorrhoid     PAST SURGICAL HISTORY: Past Surgical History  Procedure Laterality Date  . Nasal sinus surgery      x 2  . Foot surgery  06/22/11    left  . Knee arthroscopy  03/12/2005    right  . Nephrectomy living donor Left 04/2008    donated to spouse 2010([facility])  . Appendectomy  age 80  . Tumor excision  age 63    right arm  . Cataract extraction, bilateral    . Breast surgery  1999    reduction  . Breast lumpectomy  06/16/2009    left; SLN bx.  . Breast lumpectomy  07/01/2009    re-excision  . Knee arthroscopy      left  . Trigger finger release  12/16/2011    Procedure: RELEASE TRIGGER FINGER/A-1 PULLEY;  Surgeon: Tennis Must, MD;  Location: [facility];  Service: Orthopedics;  Laterality: Right;  RIGHT LONG FINGER TRIGGER RELEASE & ANNULAR CYST EXCISION  . Anterior cervical decompression/discectomy fusion 4 levels N/A 11/14/2013    Procedure: ANTERIOR CERVICAL DECOMPRESSION/DISCECTOMY FUSION 4 LEVELS;  Surgeon: Newman Pies, MD;  Location: [facility];  Service: Neurosurgery;  Laterality: N/A;  C34 C45 C56 C67 anterior cervical fusion with interbody prosthesis plating and  bonegraft  .  Colonoscopy with propofol N/A 12/03/2014    Procedure: COLONOSCOPY WITH PROPOFOL;  Surgeon: [name], MD;  Location: [facility];  Service: Endoscopy;  Laterality: N/A;    FAMILY HISTORY: Family History  Problem Relation Age of Onset  . Kidney disease Mother   . Stroke Father   . Diabetes Brother   . Heart disease Brother   . Cancer Sister     SOCIAL HISTORY:  Social History  Social History  . Marital Status: Married    Spouse Name: Marcello Moores   . Number of Children: 1  . Years of Education: HS   Occupational History  . Retired    Social History Main Topics  . Smoking status: Never Smoker   . Smokeless tobacco: Never Used  . Alcohol Use: No  . Drug Use: No  . Sexual Activity: Yes   Other Topics Concern  . Not on file   Social History Narrative      Pt lives at home with her spouse.  [name]    Patient has 1 child.    Patient is retired.    Patient has a HS education.    Patient is right handed.    Patient drinks caffeine occasionally.               PHYSICAL EXAM  Filed Vitals:   02/12/15 1308  BP: 129/85  Pulse: 95  Height: 5\' 1"  (1.549 m)  Weight: 136 lb (61.689 kg)   Body mass index is 25.71 kg/(m^2).  GENERAL EXAM: Patient is in no distress  CARDIOVASCULAR: Regular rate and rhythm, no murmurs, no carotid bruits  NEUROLOGIC: MENTAL STATUS: awake, alert, language fluent, comprehension intact, naming intact; MASKED FACIES; NEG FRONTAL RELEASE SIGNS CRANIAL NERVE: pupils equal and reactive to light, visual fields full to confrontation, extraocular muscles intact, no nystagmus, facial sensation and strength symmetric, uvula midline, shoulder shrug symmetric, tongue midline; VOICE TREMOR; SLIGHTLY STRANGULATED SOUND TO VOICE QUALITY MOTOR: normal bulk, POSTURAL AND ACTION TREMOR; INT REST TREMOR IN RUE WITH CONTRALATERAL RAM; BRADYKINESIA IN RUE > LUE; BRADYKINESIA IN LLE > RLE; NO RIGIDITY; full strength in the BUE, BLE; INTERMITTENT MOUTH  TREMOR SENSORY: normal and symmetric to light touch COORDINATION: finger-nose-finger, fine finger movements normal REFLEXES: deep tendon reflexes present and symmetric GAIT/STATION: narrow based gait; SLOW AND CAUTIOUS, SLIGHTLY DECR ARM SWING; SLIGHT TREMOR IN RUE WITH WALKING    DIAGNOSTIC DATA (LABS, IMAGING, TESTING) - I reviewed patient records, labs, notes, testing and imaging myself where available.  Lab Results  Component Value Date   WBC 5.2 01/20/2015   HGB 12.0 01/20/2015   HCT 36.1 01/20/2015   MCV 88.4 01/20/2015   PLT 293 01/20/2015      Component Value Date/Time   NA 136 01/20/2015 1326   NA 134* 11/14/2013 1309   K 4.1 01/20/2015 1326   K 4.1 11/14/2013 1309   CL 100 11/06/2013 1032   CL 102 06/30/2012 1231   CO2 27 01/20/2015 1326   CO2 27 11/06/2013 1032   GLUCOSE 66* 01/20/2015 1326   GLUCOSE 176* 11/14/2013 1309   GLUCOSE 77 06/30/2012 1231   BUN 13.9 01/20/2015 1326   BUN 13 11/06/2013 1032   CREATININE 0.9 01/20/2015 1326   CREATININE 0.73 11/06/2013 1032   CALCIUM 8.8 01/20/2015 1326   CALCIUM 9.6 11/06/2013 1032   PROT 7.4 01/20/2015 1326   PROT 6.7 05/21/2011 1423   ALBUMIN 3.6 01/20/2015 1326   ALBUMIN 4.0 05/21/2011 1423   AST 17 01/20/2015 1326   AST 18 05/21/2011 1423   ALT 13 01/20/2015 1326   ALT 10 05/21/2011 1423   ALKPHOS 86 01/20/2015 1326   ALKPHOS 44 05/21/2011 1423   BILITOT <0.30 01/20/2015 1326   BILITOT 0.2* 05/21/2011 1423   GFRNONAA 83* 11/06/2013 1032   GFRAA >90 11/06/2013 1032   No results found for: CHOL No results found for: HGBA1C No results found for: VITAMINB12 No results found for: TSH  06/25/13 EMG/NCS 1. 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 2. No evidence of widespread underlying large fiber neuropathy.   ASSESSMENT AND PLAN  73 y.o. year old female  has a past medical history of  Tremors of nervous system; GERD (gastroesophageal reflux disease); Trigger finger of right hand (11/2011); Cyst of finger (11/2011); Hypertension; Dental crowns present; Asthma; Hyperlipidemia; PONV (postoperative nausea and vomiting); Frequency of urination; Seasonal allergies; Dermatitis; Breast cancer ([place]); and Hemorrhoid. here with essential tremor since 1990's. Now with intermittent resting tremor AND bradykinesia since 2016. May represent essential tremor with superimposed (parkinson's disease vs cervical myelopathy sequelae).   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 Greater than 50% of time was spent in counseling and coordination of care with patient. In summary we discussed:  1. Continue primidone 100mg  TID; may try additional 50mg  in AM 2. Try carb/levo half tab TID x 2 weeks; then 1 tab TID 3. Caution with walking and balance --> will set up PT evaluation 4. 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 Ambulatory referral to ENT  . Ambulatory referral to Physical Therapy  . Ambulatory referral to Speech Therapy   Meds ordered this encounter  Medications  . primidone (MYSOLINE) 50 MG tablet    Sig: Take 2 tablets (100 mg total) by mouth 3 (three) times daily.    Dispense:  540 tablet    Refill:  4  . carbidopa-levodopa (SINEMET IR) 25-100 MG tablet    Sig: Take 1 tablet by mouth 3 (three) times daily before meals.    Dispense:  90 tablet    Refill:  6   Return in about 3 months (around 05/13/2015).     Penni Bombard, MD [id number], [id number] PM Certified in Neurology, Neurophysiology and Neuroimaging  [facility] [address], [place], [place] [address] [phone]

## 2015-02-12 NOTE — Patient Instructions (Signed)
Thank you for coming to see [place] at [facility]. I hope we have been able to provide you high quality care today.  You may receive a patient satisfaction survey over the next few weeks. We would appreciate your feedback and comments so that we may continue to improve ourselves and the health of our patients.  - I will refer you to physical therapy - try carbidopa / levodopa half tab three time per day; after 2 weeks increase to 1 full tab three times per day   ~~~~~~~~~~~~~~~~~~~~~~~~~~~~~~~~~~~~~~~~~~~~~~~~~~~~~~~~~~~~~~~~~  DR. Kadyn Guild'S GUIDE TO HAPPY AND HEALTHY LIVING These are some of my general health and wellness recommendations. Some of them may apply to you better than others. Please use common sense as you try these suggestions and feel free to ask me any questions.   ACTIVITY/FITNESS Mental, social, emotional and physical stimulation are very important for brain and body health. Try learning a new activity (arts, music, language, sports, games).  Keep moving your body to the best of your abilities. You can do this at home, inside or outside, the park, community center, gym or anywhere you like. Consider a physical therapist or personal trainer to get started. Consider the app Sworkit. Fitness trackers such as smart-watches, smart-phones or Fitbits can help as well.   NUTRITION Eat more plants: colorful vegetables, nuts, seeds and berries.  Eat less sugar, salt, preservatives and processed foods.  Avoid toxins such as cigarettes and alcohol.  Drink water when you are thirsty. Warm water with a slice of lemon is an excellent morning drink to start the day.  Consider these websites for more information The Nutrition Source (https://www.henry-hernandez.biz/) Precision Nutrition (WindowBlog.ch)   RELAXATION Consider practicing mindfulness meditation or other relaxation techniques such as deep breathing, prayer,  yoga, tai chi, massage. See website mindful.org or the apps Headspace or Calm to help get started.   SLEEP Try to get at least 7-8+ hours sleep per day. Regular exercise and reduced caffeine will help you sleep better. Practice good sleep hygeine techniques. See website sleep.org for more information.   PLANNING Prepare estate planning, living will, healthcare POA documents. Sometimes this is best planned with the help of an attorney. Theconversationproject.org and agingwithdignity.org are excellent resources.

## 2015-03-06 DIAGNOSIS — Z8041 Family history of malignant neoplasm of ovary: Secondary | ICD-10-CM | POA: Diagnosis not present

## 2015-03-06 DIAGNOSIS — Z8049 Family history of malignant neoplasm of other genital organs: Secondary | ICD-10-CM | POA: Diagnosis not present

## 2015-03-06 DIAGNOSIS — C50912 Malignant neoplasm of unspecified site of left female breast: Secondary | ICD-10-CM | POA: Diagnosis not present

## 2015-03-06 DIAGNOSIS — D259 Leiomyoma of uterus, unspecified: Secondary | ICD-10-CM | POA: Diagnosis not present

## 2015-03-11 ENCOUNTER — Ambulatory Visit
Payer: Medicare Other | Attending: Diagnostic Neuroimaging | Admitting: Rehabilitative and Restorative Service Providers"

## 2015-03-11 ENCOUNTER — Telehealth: Payer: Self-pay | Admitting: Rehabilitative and Restorative Service Providers"

## 2015-03-11 ENCOUNTER — Ambulatory Visit: Payer: Medicare Other

## 2015-03-11 DIAGNOSIS — R2689 Other abnormalities of gait and mobility: Secondary | ICD-10-CM | POA: Diagnosis not present

## 2015-03-11 DIAGNOSIS — R258 Other abnormal involuntary movements: Secondary | ICD-10-CM | POA: Diagnosis not present

## 2015-03-11 DIAGNOSIS — R2681 Unsteadiness on feet: Secondary | ICD-10-CM | POA: Diagnosis not present

## 2015-03-11 DIAGNOSIS — R251 Tremor, unspecified: Secondary | ICD-10-CM | POA: Insufficient documentation

## 2015-03-11 DIAGNOSIS — G20C Parkinsonism, unspecified: Secondary | ICD-10-CM

## 2015-03-11 DIAGNOSIS — R29898 Other symptoms and signs involving the musculoskeletal system: Secondary | ICD-10-CM | POA: Diagnosis not present

## 2015-03-11 DIAGNOSIS — R279 Unspecified lack of coordination: Secondary | ICD-10-CM | POA: Diagnosis not present

## 2015-03-11 DIAGNOSIS — G20 Parkinson's disease: Secondary | ICD-10-CM

## 2015-03-11 DIAGNOSIS — R490 Dysphonia: Secondary | ICD-10-CM | POA: Diagnosis not present

## 2015-03-11 DIAGNOSIS — R269 Unspecified abnormalities of gait and mobility: Secondary | ICD-10-CM | POA: Diagnosis not present

## 2015-03-11 DIAGNOSIS — G250 Essential tremor: Secondary | ICD-10-CM

## 2015-03-11 NOTE — Therapy (Signed)
[facility] [address] [place], [place], [address] Phone: [phone]   Fax:  [phone]  Patient Details  Name: April Church MRN: [id number] Date of Birth: December 06, 1940 Referring Provider: Andrey Spearman, M.D.  Encounter Date: 03/11/2015  SLP began evaluation for dysphonia and 7 minutes into evaluation pt told SLP she was scheduled for [facility] appointment on 03-26-15.  SLP believes deferring ST eval until after 03-26-15 is in pt's best interest, to create a more appropriate ST care plan than if eval were to occur today without the information obtained on 03-26-15.  Pt to reschedule eval for after 03-26-15.   [facility] , [place], [place] 03/11/2015, 9:56 AM  [facility] [address] [place], [place], [address] Phone: [phone]   Fax:  [phone]

## 2015-03-11 NOTE — Telephone Encounter (Signed)
Mrs. April Church was evaluated today in PT and ST.  She may benefit from OT order due to UE tremors and dyscoordination.  Please submit in EPIC if you agree. Thank you for the referral of this patient. Rudell Cobb, MPT

## 2015-03-11 NOTE — Therapy (Signed)
[facility] [address] [place], [place], [address] Phone: [phone]   Fax:  [phone]  Physical Therapy Evaluation  Patient Details  Name: April Church MRN: [id number] Date of Birth: 12/22/1940 Referring Provider: Penni Bombard  Encounter Date: 03/11/2015      PT End of Session - 03/11/15 1001    Visit Number 1   Number of Visits 16   Date for PT Re-Evaluation 05/06/15   Authorization Type G code due every 10th visit   PT Start Time 0840   PT Stop Time 0925   PT Time Calculation (min) 45 min   Equipment Utilized During Treatment Gait belt   Activity Tolerance Patient tolerated treatment well   Behavior During Therapy [facility] for tasks assessed/performed      Past Medical History  Diagnosis Date  . Tremors of nervous system     hands  . GERD (gastroesophageal reflux disease)   . 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 Cyst of finger 11/2011    annular cyst right long finger  . Hypertension     under control, has been on med. x 2 yrs.  . Dental crowns present   . Asthma     daily inhaler  . Hyperlipidemia   . PONV (postoperative nausea and vomiting)   . Frequency of urination   . Seasonal allergies   . Dermatitis   . Breast cancer ([place])     left- radiation and surgery -dx. 2011- no further tx. now- [name] , [name]  . Hemorrhoid     Past Surgical History  Procedure Laterality Date  . Nasal sinus surgery      x 2  . Foot surgery  06/22/11    left  . Knee arthroscopy  03/12/2005    right  . Nephrectomy living donor Left 04/2008    donated to spouse 2010([facility])  . Appendectomy  age 19  . Tumor excision  age 26    right arm  . Cataract extraction, bilateral    . Breast surgery  1999    reduction  . Breast lumpectomy  06/16/2009    left; SLN bx.  . Breast lumpectomy  07/01/2009    re-excision  . Knee arthroscopy      left  . Trigger finger release   12/16/2011    Procedure: RELEASE TRIGGER FINGER/A-1 PULLEY;  Surgeon: Tennis Must, MD;  Location: [facility];  Service: Orthopedics;  Laterality: Right;  RIGHT LONG FINGER TRIGGER RELEASE & ANNULAR CYST EXCISION  . Anterior cervical decompression/discectomy fusion 4 levels N/A 11/14/2013    Procedure: ANTERIOR CERVICAL DECOMPRESSION/DISCECTOMY FUSION 4 LEVELS;  Surgeon: Newman Pies, MD;  Location: [facility];  Service: Neurosurgery;  Laterality: N/A;  C34 C45 C56 C67 anterior cervical fusion with interbody prosthesis plating and  bonegraft  . Colonoscopy with propofol N/A 12/03/2014    Procedure: COLONOSCOPY WITH PROPOFOL;  Surgeon: [name], MD;  Location: [facility];  Service: Endoscopy;  Laterality: N/A;    There were no vitals filed for this visit.  Visit Diagnosis:  Abnormality of gait  Unsteadiness  Poor balance      Subjective Assessment - 03/11/15 0850    Subjective The pt arrives today with reports of benign tremors in her hands and voice for >10 years. Pt reports trouble with her balance when she first stands up. She reports having to sit on edge of bed to collect  herself before she rises and uses the wall at times to walk. She denies any falls at this time. Describes difficulty with stair ascent/descent at home, says she feels she has "really slowed down." Her husband has some health issues and she expresses she would like to maintain her current status to be able to take care of herself and husband.   Pertinent History ACDF 2015, intermittent L knee pain   Currently in Pain? No/denies   Multiple Pain Sites No            [facility] PT Assessment - 03/11/15 0852    Assessment   Medical Diagnosis Parkinson's, balance   Referring Provider Leta Baptist, Vikram R   Balance Screen   Has the patient fallen in the past 6 months No   Has the patient had a decrease in activity level because of a fear of falling?  Yes   Is the patient reluctant to leave  their home because of a fear of falling?  Yes   [place] Private residence   Living Arrangements Spouse/significant other   Available Help at Discharge Other (Comment)  Has help come once/week   Type of [place] to enter   Entrance Stairs-Number of Steps 2   Entrance Stairs-Rails --   Home Layout Two level   Alternate Level Stairs-Number of Steps 16   Alternate Level Stairs-Rails Left;Right  railing changes half way    Prior Function   Level of Independence Independent with basic ADLs   Vocation Retired   Observation/Other Assessments   Observations Pt exhibits tremor in voice, fasciculation L eye, chin fasciculation with speech   Coordination   Gross Motor Movements are Fluid and Coordinated Yes   Finger Nose Finger Test Dysmetria noted bilaterally, R > L   Functional Tests   Functional tests Single leg stance   Single Leg Stance   Comments 3 sec bilaterally   ROM / Strength   AROM / PROM / Strength Strength;AROM   AROM   Overall AROM  Within functional limits for tasks performed   AROM Assessment Site Other (comment)  Bilateral UE, LE   Strength   Overall Strength Within functional limits for tasks performed   Strength Assessment Site Shoulder;Elbow;Wrist;Hip;Knee;Ankle   Right/Left Shoulder Right;Left   Right Shoulder Flexion 5/5   Right Shoulder ABduction 5/5   Left Shoulder Flexion 5/5   Left Shoulder ABduction 5/5   Right/Left Elbow Right;Left   Right Elbow Flexion 5/5   Right Elbow Extension 5/5   Left Elbow Flexion 5/5   Left Elbow Extension 5/5   Right/Left Wrist Right;Left   Right Wrist Flexion 5/5   Right Wrist Extension 5/5   Left Wrist Flexion 5/5   Left Wrist Extension 5/5   Right/Left Hip Left;Right   Right Hip Flexion 4+/5   Left Hip Flexion 4-/5   Right/Left Knee Right;Left   Right Knee Flexion 5/5   Right Knee Extension 5/5   Left Knee Flexion 5/5   Left Knee Extension 5/5    Right/Left Ankle Right;Left   Right Ankle Dorsiflexion 5/5   Left Ankle Dorsiflexion 5/5   Ambulation/Gait   Ambulation/Gait Yes   Ambulation/Gait Assistance 7: Independent   Ambulation Distance (Feet) 250 Feet   Assistive device None   Gait Pattern Step-through pattern;Decreased stride length;Narrow base of support   Ambulation Surface Level;Indoor   Gait velocity 11.93   Stairs Yes   Stairs Assistance 6: Modified  independent (Device/Increase time)   Stair Management Technique One rail Right;Alternating pattern   Number of Stairs 4   Gait Comments 4 stair ascent/descent = 12.53 sec   Standardized Balance Assessment   Standardized Balance Assessment 10 meter walk test;Timed Up and Go Test   10 Meter Walk 11.93 sec/ 2.75 ft/sec   Timed Up and Go Test   TUG Normal TUG;Manual TUG;Cognitive TUG   Normal TUG (seconds) 13.62   Manual TUG (seconds) 18.48   Cognitive TUG (seconds) 27.74   TUG Comments Pt exhibits decreased postural control when rising during sit to stand with TUG with cognitive task.   Functional Gait  Assessment   Gait assessed  Yes   Gait Level Surface Walks 20 ft, slow speed, abnormal gait pattern, evidence for imbalance or deviates 10-15 in outside of the 12 in walkway width. Requires more than 7 sec to ambulate 20 ft.   Change in Gait Speed Able to change speed, demonstrates mild gait deviations, deviates 6-10 in outside of the 12 in walkway width, or no gait deviations, unable to achieve a major change in velocity, or uses a change in velocity, or uses an assistive device.   Gait with Horizontal Head Turns Performs head turns with moderate changes in gait velocity, slows down, deviates 10-15 in outside 12 in walkway width but recovers, can continue to walk.   Gait with Vertical Head Turns Performs task with slight change in gait velocity (eg, minor disruption to smooth gait path), deviates 6 - 10 in outside 12 in walkway width or uses assistive device   Gait and Pivot  Turn Pivot turns safely within 3 sec and stops quickly with no loss of balance.   Step Over Obstacle Is able to step over one shoe box (4.5 in total height) but must slow down and adjust steps to clear box safely. May require verbal cueing.   Gait with Narrow Base of Support Ambulates less than 4 steps heel to toe or cannot perform without assistance.   Gait with Eyes Closed Walks 20 ft, slow speed, abnormal gait pattern, evidence for imbalance, deviates 10-15 in outside 12 in walkway width. Requires more than 9 sec to ambulate 20 ft.   Ambulating Backwards Walks 20 ft, slow speed, abnormal gait pattern, evidence for imbalance, deviates 10-15 in outside 12 in walkway width.   Steps Alternating feet, must use rail.   Total Score 14           PT Education - 03/11/15 0959    Education provided Yes   Education Details Pt was given community resources for Power over Parkinson's (POP) support group.   Person(s) Educated Patient   Methods Explanation;Handout          PT Short Term Goals - 03/11/15 1038    PT SHORT TERM GOAL #1   Title The patient will improve FGA score from 14/30 to 18/30 to decrease risk of falls and improve dynamic and functional gait.   Baseline 14/30   Time 4   Period Weeks   Status New   PT SHORT TERM GOAL #2   Title The patient will reduce TUG with cognitive task from 27.74 seconds to < 20 seconds to improve dual tasking    Time 4   Period Weeks   Status New   PT SHORT TERM GOAL #3   Title The patient will demonstrate the ability to perform single leg stance with eyes open for at least 10 seconds bilaterally to improve ability to  navigate stairs in a safe and timely manner.   Baseline --   Time 4   Period Weeks   Status New   PT SHORT TERM GOAL #4   Title The patient will initiate an HEP to maximize functional gains from physical therapy.   Time 4   Period Weeks   Status New           PT Long Term Goals - March 18, 2015 1054    PT LONG TERM GOAL #1    Title The patient will improve FGA score from 14/30 to 23/30 to decrease risk of falls and improve functional and dynamic gait.   Baseline 14/30   Time 8   Period Weeks   Status New   PT LONG TERM GOAL #2   Title The patient will demonstrate the ability to ambulate 250' over varying, unlevel terrains (ramps, curbs, stairs) independently while performing cognitive task to demonstrate safety while dual tasking in community environments.   Time 8   Period Weeks   Status New   PT LONG TERM GOAL #3   Title The patient will ascend/descend 4 stairs mod I while using 1 railing in < 8 seconds to improve entrance/exit at home environment.   Baseline 12.53 seconds   Time 8   Period Weeks   Status New   PT LONG TERM GOAL #4   Title The patient will demonstrate independence with HEP to maintain functional gains made during physical therapy.   Time 8   Period Weeks   Status New          Plan - 03-18-15 1010    Clinical Impression Statement The pt is a 75 y/o F with recent diagnosis of PD presenting today with decreases in balance and mobility. At this time, the pt is classified as at risk for falls as evidenced by her scores on the FGA, TUG, TUG with cognitive task and TUG with manual. The pt exhibits decreased postural control, increased postural sway, and occasional LOB primarily when dual tasking and in open, busy environments. The pt's balance and mobility deficits are currently limiting her ability to ascend/descend stairs into her home. The pt would benefit from skilled PT 2x/wk for 8 weeks in order to improve her dynamic balance and mobility in order to improve her quality of life and continue serving as the primary caregiver to her husband.   Pt will benefit from skilled therapeutic intervention in order to improve on the following deficits Abnormal gait;Decreased balance;Decreased mobility;Difficulty walking   Rehab Potential Good   PT Frequency 2x / week   PT Duration 8 weeks   PT  Treatment/Interventions ADLs/Self Care Home Management;Gait training;Stair training;Functional mobility training;Therapeutic activities;Therapeutic exercise;Balance training;Neuromuscular re-education;Patient/family education;Manual techniques;Vestibular   PT Next Visit Plan Gait training, balance exercises, vary environment, incorporate dual tasking, develop HEP    Recommended Other Services Plan to request OT order   Consulted and Agree with Plan of Care Patient         G-Codes - 18-Mar-2015 1404    Functional Assessment Tool Used FGA 14/30   Functional Limitation Mobility: Walking and moving around   Mobility: Walking and Moving Around Current Status [phone]) At least 40 percent but less than 60 percent impaired, limited or restricted   Mobility: Walking and Moving Around Goal Status [phone]) At least 20 percent but less than 40 percent impaired, limited or restricted         Problem List Patient Active Problem List   Diagnosis Date Noted  .  Cervical spondylosis with myelopathy and radiculopathy 11/14/2013  . Essential tremor 06/13/2013  . Cervical spondylosis without myelopathy 06/13/2013  . Breast cancer of lower-outer quadrant of left female breast ([place]) 09/10/2010   This entire session was performed under direct supervision and direction of a licensed therapist/therapist assistant . I have personally read, edited and approve of the note as written. WEAVER,CHRISTINA, PT  De Nurse, SPT 03/11/2015, 11:40 AM  [facility] [address] [place], [place], [address] Phone: [phone]   Fax:  [phone]  Name: April Church MRN: [id number] Date of Birth: [birth year]

## 2015-03-14 DIAGNOSIS — M542 Cervicalgia: Secondary | ICD-10-CM | POA: Diagnosis not present

## 2015-03-17 DIAGNOSIS — M1712 Unilateral primary osteoarthritis, left knee: Secondary | ICD-10-CM | POA: Diagnosis not present

## 2015-03-19 ENCOUNTER — Ambulatory Visit: Payer: Medicare Other | Admitting: Rehabilitative and Restorative Service Providers"

## 2015-03-19 DIAGNOSIS — R2681 Unsteadiness on feet: Secondary | ICD-10-CM

## 2015-03-19 DIAGNOSIS — R269 Unspecified abnormalities of gait and mobility: Secondary | ICD-10-CM | POA: Diagnosis not present

## 2015-03-19 DIAGNOSIS — R258 Other abnormal involuntary movements: Secondary | ICD-10-CM | POA: Diagnosis not present

## 2015-03-19 DIAGNOSIS — R251 Tremor, unspecified: Secondary | ICD-10-CM | POA: Diagnosis not present

## 2015-03-19 DIAGNOSIS — R2689 Other abnormalities of gait and mobility: Secondary | ICD-10-CM | POA: Diagnosis not present

## 2015-03-19 DIAGNOSIS — R279 Unspecified lack of coordination: Secondary | ICD-10-CM | POA: Diagnosis not present

## 2015-03-19 NOTE — Patient Instructions (Signed)
Feet Heel-Toe "Tandem"    Arms outstretched, walk a straight line bringing one foot directly in front of the other. Repeat for _10 steps along a countertop or a long wall.  Repeat 3 times down/back. Do __2__ sessions per day.  Copyright  VHI. All rights reserved.   Feet Apart (Compliant Surface) Varied Arm Positions - Eyes Closed    Stand on compliant surface: _pillow_______ with feet shoulder width apart and arms at your side. Close eyes and visualize upright position. Hold__30__ seconds. Repeat _3___ times per session. Do __2__ sessions per day.  Copyright  VHI. All rights reserved.   Balance: One-Leg    Stand on left leg on foot. Use hand supports. Use support as little as possible but as much as needed. Stand _10__ seconds. Repeat, standing on right leg. 3 times on each leg.  Copyright  VHI. All rights reserved.

## 2015-03-20 DIAGNOSIS — Z853 Personal history of malignant neoplasm of breast: Secondary | ICD-10-CM | POA: Diagnosis not present

## 2015-03-20 NOTE — Therapy (Signed)
April Church [address] [place], [place], [address] Phone: [phone]   Fax:  [phone]  Physical Therapy Treatment  Patient Details  Name: April Church MRN: [id number] Date of Birth: 1940-12-03 Referring Provider: Penni Bombard  Encounter Date: 03/19/2015      PT End of Session - 03/20/15 1443    Visit Number 2   Number of Visits 16   Date for PT Re-Evaluation 05/06/15   Authorization Type G code due every 10th visit   PT Start Time 1235   PT Stop Time 1320   PT Time Calculation (min) 45 min   Equipment Utilized During Treatment Gait belt   Activity Tolerance Patient tolerated treatment well   Behavior During Therapy [facility] for tasks assessed/performed      Past Medical History  Diagnosis Date  . Tremors of nervous system     hands  . GERD (gastroesophageal reflux disease)   . 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 Cyst of finger 11/2011    annular cyst right long finger  . Hypertension     under control, has been on med. x 2 yrs.  . Dental crowns present   . Asthma     daily inhaler  . Hyperlipidemia   . PONV (postoperative nausea and vomiting)   . Frequency of urination   . Seasonal allergies   . Dermatitis   . Breast cancer ([place])     left- radiation and surgery -dx. 2011- no further tx. now- [name] , [name]  . Hemorrhoid     Past Surgical History  Procedure Laterality Date  . Nasal sinus surgery      x 2  . Foot surgery  06/22/11    left  . Knee arthroscopy  03/12/2005    right  . Nephrectomy living donor Left 04/2008    donated to spouse 2010([facility])  . Appendectomy  age 42  . Tumor excision  age 24    right arm  . Cataract extraction, bilateral    . Breast surgery  1999    reduction  . Breast lumpectomy  06/16/2009    left; SLN bx.  . Breast lumpectomy  07/01/2009    re-excision  . Knee arthroscopy      left  . Trigger finger release   12/16/2011    Procedure: RELEASE TRIGGER FINGER/A-1 PULLEY;  Surgeon: Tennis Must, MD;  Location: [facility];  Service: Orthopedics;  Laterality: Right;  RIGHT LONG FINGER TRIGGER RELEASE & ANNULAR CYST EXCISION  . Anterior cervical decompression/discectomy fusion 4 levels N/A 11/14/2013    Procedure: ANTERIOR CERVICAL DECOMPRESSION/DISCECTOMY FUSION 4 LEVELS;  Surgeon: Newman Pies, MD;  Location: [place] [facility];  Service: Neurosurgery;  Laterality: N/A;  C34 C45 C56 C67 anterior cervical fusion with interbody prosthesis plating and  bonegraft  . Colonoscopy with propofol N/A 12/03/2014    Procedure: COLONOSCOPY WITH PROPOFOL;  Surgeon: [name], MD;  [facility];  Service: Endoscopy;  Laterality: N/A;    There were no vitals filed for this visit.  Visit Diagnosis:  Abnormality of gait  Unsteadiness      Subjective Assessment - 03/19/15 1236    Subjective The patient reports that she received injection in her L knee at orthopedic MD office last week.   Patient Stated Goals "I would like to see some improvement in my voice."   Currently in Pain? No/denies  [facility]) - 03/19/15 1259    PWR! Up power standing position emphasized   PWR Step --   PWR! Rock and Step Turn 5 reps stepping posteriorly         [facility]) - 03/19/15 1300    Step and Reach Forward 10 reps   Step and Reach Backward --   5 reps right and left   Step and Reach Sideways 10 reps   Rock and Reach Forward/Backward 10 reps    Gait: Dynamic gait activities with side stepping, forwards/backwards walking, gait with head turns, and multi-directional changes x 450 feet, 230 feet x 3 times.        PT Education - 03/20/15 1442    Education provided Yes   Education Details HEP: tandem gait, eyes closed on pillow stand, single limb stance.   Person(s) Educated Patient   Methods Explanation;Demonstration;Handout   Comprehension Verbalized understanding;Returned  demonstration          PT Short Term Goals - 03/11/15 1038    PT SHORT TERM GOAL #1   Title The patient will improve FGA score from 14/30 to 18/30 to decrease risk of falls and improve dynamic and functional gait.   Baseline 14/30   Time 4   Period Weeks   Status New   PT SHORT TERM GOAL #2   Title The patient will reduce TUG with cognitive task from 27.74 seconds to < 20 seconds to improve dual tasking    Time 4   Period Weeks   Status New   PT SHORT TERM GOAL #3   Title The patient will demonstrate the ability to perform single leg stance with eyes open for at least 10 seconds bilaterally to improve ability to navigate stairs in a safe and timely manner.   Baseline --   Time 4   Period Weeks   Status New   PT SHORT TERM GOAL #4   Title The patient will initiate an HEP to maximize functional gains from physical therapy.   Time 4   Period Weeks   Status New           PT Long Term Goals - 03/11/15 1054    PT LONG TERM GOAL #1   Title The patient will improve FGA score from 14/30 to 23/30 to decrease risk of falls and improve functional and dynamic gait.   Baseline 14/30   Time 8   Period Weeks   Status New   PT LONG TERM GOAL #2   Title The patient will demonstrate the ability to ambulate 250' over varying, unlevel terrains (ramps, curbs, stairs) independently while performing cognitive task to demonstrate safety while dual tasking in community environments.   Time 8   Period Weeks   Status New   PT LONG TERM GOAL #3   Title The patient will ascend/descend 4 stairs mod I while using 1 railing in < 8 seconds to improve entrance/exit at home environment.   Baseline 12.53 seconds   Time 8   Period Weeks   Status New   PT LONG TERM GOAL #4   Title The patient will demonstrate independence with HEP to maintain functional gains made during physical therapy.   Time 8   Period Weeks   Status New               Plan - 03/20/15 1444    Clinical Impression  Statement The patient's clinical presentation today was at a higher level than eval.  This may  have to do to with medication timeframes.  PT reduced to 1x/week as she has HEP to challenge balance and narrow base of support.   PT Next Visit Plan Gait training, balance exercises, vary environment, incorporate dual tasking, develop HEP    Consulted and Agree with Plan of Care Patient        Problem List Patient Active Problem List   Diagnosis Date Noted  . Cervical spondylosis with myelopathy and radiculopathy 11/14/2013  . Essential tremor 06/13/2013  . Cervical spondylosis without myelopathy 06/13/2013  . Breast cancer of lower-outer quadrant of left female breast ([place]) 09/10/2010    April Church, PT 03/20/2015, 5:31 PM  [facility] [address] [place], [place], [address] Phone: [phone]   Fax:  [phone]  Name: April Church MRN: [id number] Date of Birth: 07/29/1940

## 2015-03-21 ENCOUNTER — Ambulatory Visit: Payer: Medicare Other | Admitting: Rehabilitative and Restorative Service Providers"

## 2015-03-24 ENCOUNTER — Encounter: Payer: Medicare Other | Admitting: Occupational Therapy

## 2015-03-24 ENCOUNTER — Ambulatory Visit: Payer: Medicare Other | Admitting: Rehabilitative and Restorative Service Providers"

## 2015-03-24 DIAGNOSIS — R251 Tremor, unspecified: Secondary | ICD-10-CM | POA: Diagnosis not present

## 2015-03-24 DIAGNOSIS — R269 Unspecified abnormalities of gait and mobility: Secondary | ICD-10-CM

## 2015-03-24 DIAGNOSIS — R258 Other abnormal involuntary movements: Secondary | ICD-10-CM | POA: Diagnosis not present

## 2015-03-24 DIAGNOSIS — R2689 Other abnormalities of gait and mobility: Secondary | ICD-10-CM | POA: Diagnosis not present

## 2015-03-24 DIAGNOSIS — R2681 Unsteadiness on feet: Secondary | ICD-10-CM

## 2015-03-24 DIAGNOSIS — R279 Unspecified lack of coordination: Secondary | ICD-10-CM | POA: Diagnosis not present

## 2015-03-24 NOTE — Patient Instructions (Signed)
Return to treadmill use (as long as the L knee doesn't hurt): Begin with 8 minutes using handrails on treadmill. Begin at 2.5 mph   Can increase time and speed as you feel you can safely tolerate.

## 2015-03-24 NOTE — Therapy (Signed)
[facility] [address] [place], [place], [address] Phone: [phone]   Fax:  [phone]  Physical Therapy Treatment  Patient Details  Name: April Church MRN: [id number] Date of Birth: Jan 31, 1941 Referring Provider: Penni Bombard  Encounter Date: 03/24/2015      PT End of Session - 03/24/15 1238    Visit Number 3   Number of Visits 16   Date for PT Re-Evaluation 05/06/15   Authorization Type G code due every 10th visit   PT Start Time 1240   PT Stop Time 1320   PT Time Calculation (min) 40 min   Activity Tolerance Patient tolerated treatment well   Behavior During Therapy [facility] for tasks assessed/performed      Past Medical History  Diagnosis Date  . Tremors of nervous system     hands  . GERD (gastroesophageal reflux disease)   . 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 Cyst of finger 11/2011    annular cyst right long finger  . Hypertension     under control, has been on med. x 2 yrs.  . Dental crowns present   . Asthma     daily inhaler  . Hyperlipidemia   . PONV (postoperative nausea and vomiting)   . Frequency of urination   . Seasonal allergies   . Dermatitis   . Breast cancer ([place])     left- radiation and surgery -dx. 2011- no further tx. now- [name] , [name]  . Hemorrhoid     Past Surgical History  Procedure Laterality Date  . Nasal sinus surgery      x 2  . Foot surgery  06/22/11    left  . Knee arthroscopy  03/12/2005    right  . Nephrectomy living donor Left 04/2008    donated to spouse 2010([facility])  . Appendectomy  age 18  . Tumor excision  age 70    right arm  . Cataract extraction, bilateral    . Breast surgery  1999    reduction  . Breast lumpectomy  06/16/2009    left; SLN bx.  . Breast lumpectomy  07/01/2009    re-excision  . Knee arthroscopy      left  . Trigger finger release  12/16/2011    Procedure: RELEASE TRIGGER FINGER/A-1  PULLEY;  Surgeon: Tennis Must, MD;  Location: [facility];  Service: Orthopedics;  Laterality: Right;  RIGHT LONG FINGER TRIGGER RELEASE & ANNULAR CYST EXCISION  . Anterior cervical decompression/discectomy fusion 4 levels N/A 11/14/2013    Procedure: ANTERIOR CERVICAL DECOMPRESSION/DISCECTOMY FUSION 4 LEVELS;  Surgeon: Newman Pies, MD;  Location: [place] [facility];  Service: Neurosurgery;  Laterality: N/A;  C34 C45 C56 C67 anterior cervical fusion with interbody prosthesis plating and  bonegraft  . Colonoscopy with propofol N/A 12/03/2014    Procedure: COLONOSCOPY WITH PROPOFOL;  Surgeon: [name], MD;  [facility];  Service: Endoscopy;  Laterality: N/A;    There were no vitals filed for this visit.  Visit Diagnosis:  Abnormality of gait  Unsteadiness      Subjective Assessment - 03/24/15 1356    Subjective The patient reports she is doing HEP regularly.  She used to use a treadmill at home during the winter, but has avoided due to L knee pain.  Her knee pain is imporoved after injection last week.   Patient Stated Goals "I would like to see some improvement  in my voice."   Currently in Pain? No/denies            [facility] PT Assessment - 03/24/15 1248    Functional Gait  Assessment   Gait assessed  Yes   Gait Level Surface Walks 20 ft in less than 7 sec but greater than 5.5 sec, uses assistive device, slower speed, mild gait deviations, or deviates 6-10 in outside of the 12 in walkway width.   Change in Gait Speed Able to change speed, demonstrates mild gait deviations, deviates 6-10 in outside of the 12 in walkway width, or no gait deviations, unable to achieve a major change in velocity, or uses a change in velocity, or uses an assistive device.   Gait with Horizontal Head Turns Performs head turns with moderate changes in gait velocity, slows down, deviates 10-15 in outside 12 in walkway width but recovers, can continue to walk.   Gait with Vertical  Head Turns Performs task with slight change in gait velocity (eg, minor disruption to smooth gait path), deviates 6 - 10 in outside 12 in walkway width or uses assistive device   Gait and Pivot Turn Pivot turns safely within 3 sec and stops quickly with no loss of balance.   Step Over Obstacle Is able to step over one shoe box (4.5 in total height) but must slow down and adjust steps to clear box safely. May require verbal cueing.   Gait with Narrow Base of Support Ambulates 4-7 steps.   Gait with Eyes Closed Walks 20 ft, uses assistive device, slower speed, mild gait deviations, deviates 6-10 in outside 12 in walkway width. Ambulates 20 ft in less than 9 sec but greater than 7 sec.   Ambulating Backwards Walks 20 ft, uses assistive device, slower speed, mild gait deviations, deviates 6-10 in outside 12 in walkway width.   Steps Alternating feet, must use rail.   Total Score 18                     [facility] Adult PT Treatment/Exercise - 03/24/15 1400    Ambulation/Gait   Ambulation/Gait Yes   Ambulation/Gait Assistance 7: Independent   Ambulation Distance (Feet) 500 Feet   Assistive device None   Stairs Yes   Stairs Assistance 6: Modified independent (Device/Increase time)   Stair Management Technique One rail Right;No rails  one rail x 3 reps (4 steps), no rails x 2 reps   Number of Stairs 4   Gait Comments The patient ascended/descended steps in 7.90 seconds without a rail today.  PT emphasized big steps up/down.  She has decreased arm swing worked on during gait activities.  Multi-directional gait with side step, backwards/forwards and quick start/stops.  Added head turns horiz/vertical for further challenge.  SBA to CGA provided during dynamic tasks.   Neuro Re-ed    Neuro Re-ed Details  Single limb stance activities.  Sit<>bilateral sidelying without dizziness checked due to reports of "having to sit for minutes" in the morning.           [facility]) - 03/24/15 1358     PWR! [employer]! rock x 10 reps to each side with cues on greater weight shift to the left side.   PWR! Twist STanding rotation R<>L with verbal and tactile cues to hit middle position of "T" with upper extremities.   PWR Step Lateral stepping R<>L with emphasis on turning to look to each side during movements.  PT Education - 03/24/15 1318    Education provided Yes   Education Details HEP: PWR standing with trunk rotation, rock and reach, and side step to midline.   Person(s) Educated Patient   Methods Explanation;Demonstration;Handout   Comprehension Verbalized understanding;Returned demonstration          PT Short Term Goals - 03/24/15 1242    PT SHORT TERM GOAL #1   Title The patient will improve FGA score from 14/30 to 18/30 to decrease risk of falls and improve dynamic and functional gait.   Baseline Pt scores 18/30 on 03/24/2015   Time 4   Period Weeks   Status Achieved   PT SHORT TERM GOAL #2   Title The patient will reduce TUG with cognitive task from 27.74 seconds to < 20 seconds to improve dual tasking    Baseline Pt performed in 15.8 seconds counting backwards by 3's.   Time 4   Period Weeks   Status Achieved   PT SHORT TERM GOAL #3   Title The patient will demonstrate the ability to perform single leg stance with eyes open for at least 10 seconds bilaterally to improve ability to navigate stairs in a safe and timely manner.   Time 4   Period Weeks   Status On-going   PT SHORT TERM GOAL #4   Title The patient will initiate an HEP to maximize functional gains from physical therapy.   Time 4   Period Weeks   Status On-going           PT Long Term Goals - 03/24/15 1302    PT LONG TERM GOAL #1   Title The patient will improve FGA score from 14/30 to 23/30 to decrease risk of falls and improve functional and dynamic gait.   Baseline 14/30   Time 8   Period Weeks   Status On-going   PT LONG TERM GOAL #2   Title The patient will  demonstrate the ability to ambulate 250' over varying, unlevel terrains (ramps, curbs, stairs) independently while performing cognitive task to demonstrate safety while dual tasking in community environments.   Time 8   Period Weeks   Status On-going   PT LONG TERM GOAL #3   Title The patient will ascend/descend 4 stairs mod I while using 1 railing in < 8 seconds to improve entrance/exit at home environment.   Baseline Performed in 7.9 seconds   Time 8   Period Weeks   Status Achieved   PT LONG TERM GOAL #4   Title The patient will demonstrate independence with HEP to maintain functional gains made during physical therapy.   Time 8   Period Weeks   Status On-going               Plan - 03/24/15 1357    Clinical Impression Statement The patient has met 2 STGs and 1 LTG.  She is progressing well with HEP and PT added PWR! program in standing for progression of current activities.  Plan to reassess and plan for early d/c due to improvement since initial evaluation.   PT Next Visit Plan Gait training, balance exercises, vary environment, incorporate dual tasking, develop HEP         Problem List Patient Active Problem List   Diagnosis Date Noted  . Cervical spondylosis with myelopathy and radiculopathy 11/14/2013  . Essential tremor 06/13/2013  . Cervical spondylosis without myelopathy 06/13/2013  . Breast cancer of lower-outer quadrant of left female breast ([place]) 09/10/2010  [place], [place] 03/24/2015, 2:03 PM  [facility] [address] [place], [place], [address] Phone: [phone]   Fax:  [phone]  Name: April Church MRN: [id number] Date of Birth: Dec 06, 1940

## 2015-03-25 ENCOUNTER — Ambulatory Visit: Payer: Medicare Other | Admitting: Occupational Therapy

## 2015-03-25 DIAGNOSIS — R278 Other lack of coordination: Secondary | ICD-10-CM

## 2015-03-25 DIAGNOSIS — R269 Unspecified abnormalities of gait and mobility: Secondary | ICD-10-CM | POA: Diagnosis not present

## 2015-03-25 DIAGNOSIS — R279 Unspecified lack of coordination: Secondary | ICD-10-CM | POA: Diagnosis not present

## 2015-03-25 DIAGNOSIS — R251 Tremor, unspecified: Secondary | ICD-10-CM | POA: Diagnosis not present

## 2015-03-25 DIAGNOSIS — R258 Other abnormal involuntary movements: Secondary | ICD-10-CM

## 2015-03-25 DIAGNOSIS — R2689 Other abnormalities of gait and mobility: Secondary | ICD-10-CM | POA: Diagnosis not present

## 2015-03-25 DIAGNOSIS — R2681 Unsteadiness on feet: Secondary | ICD-10-CM | POA: Diagnosis not present

## 2015-03-25 DIAGNOSIS — R29898 Other symptoms and signs involving the musculoskeletal system: Secondary | ICD-10-CM

## 2015-03-25 NOTE — Therapy (Addendum)
[facility] [address] [place], [place], [address] Phone: [phone]   Fax:  [phone]  Occupational Therapy Evaluation  Patient Details  Name: April Church MRN: [id number] Date of Birth: [birth year] Referring Provider: Dr. Andrey Spearman  Encounter Date: 03/25/2015      OT End of Session - 03/25/15 1336    Visit Number 1   Number of Visits 13   Date for OT Re-Evaluation 05/09/15   Authorization Type Medicare primary, BCBS secondary (g-code needed)   Authorization Time Period 03/25/15-05/23/15   Authorization - Visit Number 1   Authorization - Number of Visits 10   OT Start Time 1147   OT Stop Time 1239   OT Time Calculation (min) 52 min   Activity Tolerance Patient tolerated treatment well   Behavior During Therapy [facility] for tasks assessed/performed      Past Medical History  Diagnosis Date  . Tremors of nervous system     hands  . GERD (gastroesophageal reflux disease)   . 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 Cyst of finger 11/2011    annular cyst right long finger  . Hypertension     under control, has been on med. x 2 yrs.  . Dental crowns present   . Asthma     daily inhaler  . Hyperlipidemia   . PONV (postoperative nausea and vomiting)   . Frequency of urination   . Seasonal allergies   . Dermatitis   . Breast cancer ([place])     left- radiation and surgery -dx. 2011- no further tx. now- [name] , [name]  . Hemorrhoid     Past Surgical History  Procedure Laterality Date  . Nasal sinus surgery      x 2  . Foot surgery  06/22/11    left  . Knee arthroscopy  03/12/2005    right  . Nephrectomy living donor Left 04/2008    donated to spouse 2010([facility])  . Appendectomy  age 1  . Tumor excision  age 60    right arm  . Cataract extraction, bilateral    . Breast surgery  1999    reduction  . Breast lumpectomy  06/16/2009    left; SLN bx.  . Breast lumpectomy   07/01/2009    re-excision  . Knee arthroscopy      left  . Trigger finger release  12/16/2011    Procedure: RELEASE TRIGGER FINGER/A-1 PULLEY;  Surgeon: Tennis Must, MD;  Location: [place];  Service: Orthopedics;  Laterality: Right;  RIGHT LONG FINGER TRIGGER RELEASE & ANNULAR CYST EXCISION  . Anterior cervical decompression/discectomy fusion 4 levels N/A 11/14/2013    Procedure: ANTERIOR CERVICAL DECOMPRESSION/DISCECTOMY FUSION 4 LEVELS;  Surgeon: Newman Pies, MD;  Location: [place] [facility];  Service: Neurosurgery;  Laterality: N/A;  C34 C45 C56 C67 anterior cervical fusion with interbody prosthesis plating and  bonegraft  . Colonoscopy with propofol N/A 12/03/2014    Procedure: COLONOSCOPY WITH PROPOFOL;  Surgeon: [name], MD;  [facility];  Service: Endoscopy;  Laterality: N/A;    There were no vitals filed for this visit.  Visit Diagnosis:  Decreased coordination  Tremor  Bradykinesia  Rigidity      Subjective Assessment - 03/25/15 1201    Subjective  Pt did not take PD meds today due to some nausea (and was leaving for therapy), but did take tremor meds.  Pt reports that  she has only been taking 1 pill 3x/day for 1 week.   Pertinent History benign essential tremor mostly in RUE (and voice) for >53yrs; cervical decompression 10/2014   Patient Stated Goals improve use of hands, writing, using utensils, care for herself/husband (who has medical issues)   Currently in Pain? No/denies           [facility] OT Assessment - 03/25/15 0001    Assessment   Diagnosis Parkinsonism   Referring Provider Dr. Andrey Spearman   Onset Date --  approx 6 months    Prior Therapy currently PT, will also start speech soon   Precautions   Precautions None   Balance Screen   Has the patient fallen in the past 6 months No   Home  Environment   Family/patient expects to be discharged to: Private residence   Type of [place] with most living area  on 1st floor   Lives With Spouse   Prior Function   Level of Independence Independent with basic ADLs;Independent with homemaking with ambulation   Vocation Retired   [employer] preschool, clarical    Leisure walking outside, read, church activities   ADL   Eating/Feeding --  min spills with utensils, spills with holding glass   Eating/Feeding Patient Percentage --  difficulty opening containers, manipulating utensils   Grooming --  incr time   Upper Body Bathing --  incr time   Lower Body Bathing Increased time   Upper Body Dressing --  difficulty with jewerly, min difficulty with buttons   Lower Body Dressing --  incr time   Toilet Tranfer Modified independent   Toileting - Clothing Manipulation Modified independent   [place] Transfer Modified independent  seat available   IADL   Shopping Takes care of all shopping needs independently   Light Housekeeping --  performs light activities; has help for heavier activities   Prior Level of Function Meal Prep independently   Meal Prep --  mod I   Community Mobility Drives own vehicle   Medication Management Is responsible for taking medication in correct dosages at correct time   Physiological scientist financial matters independently (budgets, writes checks, pays rent, bills goes to bank), collects and keeps track of income  mostly on Insurance risk surveyor Status Independent   Written Expression   Dominant Hand Right   Handwriting Increased time  mild tremor, gets messier with longer writing per pt   Vision - History   Baseline Vision Wears glasses only for reading   Cognition   Bradyphrenia Yes  mild per pt   Observation/Other Assessments   Other Surveys  Select   Physical Performance Test   Yes   Simulated Eating Time (seconds) 16.28   Donning Doffing Jacket Time (seconds) 14.15   Donning Doffing Jacket Comments buttoning/unbuttoning 3  buttons on table in 41.31sec   Coordination   Gross Motor Movements are Fluid and Coordinated No  RUE min difficulty with action tremor   9 Hole Peg Test Right;Left   Right 9 Hole Peg Test 39.56   Left 9 Hole Peg Test 30.79   Box and Blocks R-39blocks, L-39blocks   Tremors R hand appears to have more action tremor and L hand appears to have more resting tremor.     Tone   Assessment Location Right Upper Extremity;Left Upper Extremity   ROM / Strength   AROM / PROM / Strength AROM  AROM   Overall AROM  Within functional limits for tasks performed  BUEs   RUE Tone   RUE Tone Mild   LUE Tone   LUE Tone Mild                       OT Education - 03/25/15 1309    Education provided Yes   Education Details Importance of timing of medication and avoiding protein when taking meals, ok to eat non-protein with meds; POC; difference between types of tremors (resting/PD related vs. ET); early intervention can make greater functional changes/prevent future complications   Person(s) Educated Patient   Methods Explanation   Comprehension Verbalized understanding          OT Short Term Goals - 03/25/15 1342    OT SHORT TERM GOAL #1   Title Pt will be independent with HEP--check STGs 04/23/15   Time 4   Period Weeks   Status New   OT SHORT TERM GOAL #2   Title Pt will verbalize understanding of ways to prevent future complications and appropriate community resources.   Time 4   Period Weeks   Status New           OT Long Term Goals - 03/25/15 1343    OT LONG TERM GOAL #1   Title Pt will verbalize understanding of strategies/AE to incr ease with ADLs (including eating, writing, buttoning).--check LTGs 05/09/15   Time 6   Period Weeks   Status New   OT LONG TERM GOAL #2   Title Pt will improve coordination for ADLs (fastening jewerly) as shown by improving time on 9-hole peg test by at least 5sec with R hand.   Baseline 39.56sec   Time 6   Period Weeks   Status  New   OT LONG TERM GOAL #3   Title Pt will improve coordination/functional reaching for ADLs as shown by improving score on box and blocks by at least 4 bilaterally.   Baseline 39 blocks bilaterally   Time 6   Period Weeks   Status New   OT LONG TERM GOAL #4   Title Pt will button/unbutton 3 buttons on tabletop in 36sec or less.   Baseline 41.31sec   Time 6   Period Weeks   Status New   OT LONG TERM GOAL #5   Title Pt will improve time on PPT#2 (simulated eating) to 13sec or less using AE/strategies prn.   Baseline 16.28sec   Time 6   Period Weeks   Status New               Plan - 03/25/15 1342    Clinical Impression Statement Pt is 75 y.o. female with hx of benign essential tremors (primarily affecting RUE and voice) as well as cervical decompression 10/2013 and Parkinsonism diagnosed approx 6 months ago.  Pt reports new tremors in the LUE (appear to be more resting), bradykinesia, and incr difficulty with ADLs.  Pt presents with bradykinesia, rigidity, decr coordination, and tremors affecting UE functional use.  Pt would benefit from occupational therapy to address these deficits in order to prevent future complications, improve UE functional use, and ADL performance in order to be able to continue to care for herself and husband who has medical issues (pt's goals).   Pt will benefit from skilled therapeutic intervention in order to improve on the following deficits (Retired) Impaired UE functional use;Decreased knowledge of use of DME;Decreased balance;Impaired tone;Decreased coordination  bradykinesia  Rehab Potential Good   OT Frequency 2x / week   OT Duration 6 weeks  +eval   OT Treatment/Interventions Moist Heat;Visual/perceptual remediation/compensation;Cognitive remediation/compensation;Fluidtherapy;Electrical Stimulation;Parrafin;DME and/or AE instruction;Cryotherapy;Therapeutic activities;Passive range of motion;Contrast Bath;Therapeutic exercises;Energy  conservation;Splinting;Manual Therapy;Neuromuscular education;Ultrasound;Self-care/ADL training;Therapeutic exercise;Functional Mobility Training;Patient/family education  PWR! techniques   Plan education regarding strategies for eating, coordination HEP   Consulted and Agree with Plan of Care Patient          G-Codes - 04-22-15 1402    Functional Assessment Tool Used 9-hole peg test:  R 39.56sec; box and blocks test:  34 blocks bilaterally; difficulty with opening packages/containers, manipulating utensils, and writing   Functional Limitation Carrying, moving and handling objects   Carrying, Moving and Handling Objects Current Status [id number]) At least 20 percent but less than 40 percent impaired, limited or restricted   Carrying, Moving and Handling Objects Goal Status [id number]) At least 1 percent but less than 20 percent impaired, limited or restricted      Problem List Patient Active Problem List   Diagnosis Date Noted  . Cervical spondylosis with myelopathy and radiculopathy 11/14/2013  . Essential tremor 06/13/2013  . Cervical spondylosis without myelopathy 06/13/2013  . Breast cancer of lower-outer quadrant of left female breast ([place]) 09/10/2010    Vianne Bulls Apr 22, 2015, 2:08 PM  [facility] [address] [place], [place], [address] Phone: [phone]   Fax:  [phone]  Name: April Church MRN: [id number] Date of Birth: Jun 25, 1940  Vianne Bulls, OTR/L 04-22-15 2:08 PM

## 2015-03-26 ENCOUNTER — Encounter: Payer: Medicare Other | Admitting: Occupational Therapy

## 2015-03-26 ENCOUNTER — Ambulatory Visit: Payer: Medicare Other | Admitting: Physical Therapy

## 2015-03-26 ENCOUNTER — Telehealth: Payer: Self-pay

## 2015-03-26 DIAGNOSIS — I10 Essential (primary) hypertension: Secondary | ICD-10-CM | POA: Diagnosis not present

## 2015-03-26 DIAGNOSIS — J384 Edema of larynx: Secondary | ICD-10-CM | POA: Diagnosis not present

## 2015-03-26 DIAGNOSIS — J45909 Unspecified asthma, uncomplicated: Secondary | ICD-10-CM | POA: Diagnosis not present

## 2015-03-26 DIAGNOSIS — Z7951 Long term (current) use of inhaled steroids: Secondary | ICD-10-CM | POA: Diagnosis not present

## 2015-03-26 DIAGNOSIS — R490 Dysphonia: Secondary | ICD-10-CM | POA: Diagnosis not present

## 2015-03-26 DIAGNOSIS — R498 Other voice and resonance disorders: Secondary | ICD-10-CM | POA: Diagnosis not present

## 2015-03-26 DIAGNOSIS — Z79899 Other long term (current) drug therapy: Secondary | ICD-10-CM | POA: Diagnosis not present

## 2015-03-26 DIAGNOSIS — J385 Laryngeal spasm: Secondary | ICD-10-CM | POA: Diagnosis not present

## 2015-03-26 NOTE — Telephone Encounter (Signed)
Pt informed front office staff that her ENT at [facility] wants pt to receive ST at that facility and so she called to cancel remaining ST. Although pt could cont to receive ST services at this facility where she is receiving PT and OT, she will be discharged at this time.

## 2015-03-28 ENCOUNTER — Ambulatory Visit: Payer: Medicare Other | Admitting: Rehabilitative and Restorative Service Providers"

## 2015-03-28 DIAGNOSIS — J385 Laryngeal spasm: Secondary | ICD-10-CM | POA: Diagnosis not present

## 2015-03-28 DIAGNOSIS — R490 Dysphonia: Secondary | ICD-10-CM | POA: Diagnosis not present

## 2015-04-02 ENCOUNTER — Ambulatory Visit
Payer: Medicare Other | Attending: Diagnostic Neuroimaging | Admitting: Rehabilitative and Restorative Service Providers"

## 2015-04-02 ENCOUNTER — Encounter: Payer: Self-pay | Admitting: Rehabilitative and Restorative Service Providers"

## 2015-04-02 DIAGNOSIS — R2681 Unsteadiness on feet: Secondary | ICD-10-CM | POA: Diagnosis not present

## 2015-04-02 DIAGNOSIS — R251 Tremor, unspecified: Secondary | ICD-10-CM | POA: Insufficient documentation

## 2015-04-02 DIAGNOSIS — R279 Unspecified lack of coordination: Secondary | ICD-10-CM | POA: Insufficient documentation

## 2015-04-02 DIAGNOSIS — R29898 Other symptoms and signs involving the musculoskeletal system: Secondary | ICD-10-CM | POA: Insufficient documentation

## 2015-04-02 DIAGNOSIS — R269 Unspecified abnormalities of gait and mobility: Secondary | ICD-10-CM | POA: Diagnosis not present

## 2015-04-02 DIAGNOSIS — G250 Essential tremor: Secondary | ICD-10-CM | POA: Insufficient documentation

## 2015-04-02 DIAGNOSIS — R278 Other lack of coordination: Secondary | ICD-10-CM

## 2015-04-02 DIAGNOSIS — R258 Other abnormal involuntary movements: Secondary | ICD-10-CM | POA: Insufficient documentation

## 2015-04-02 NOTE — Therapy (Signed)
[facility] [address] [place], [place], [address] Phone: [phone]   Fax:  [phone]  Patient Details  Name: April Church MRN: [id number] Date of Birth: 03/10/1940 Referring Provider:  No ref. provider found  Encounter Date: 04/02/2015  PHYSICAL THERAPY DISCHARGE SUMMARY  Visits from Start of Care: 4  Current functional level related to goals / functional outcomes:     PT Short Term Goals - 04/02/15 1310    PT SHORT TERM GOAL #1   Title The patient will improve FGA score from 14/30 to 18/30 to decrease risk of falls and improve dynamic and functional gait.   Baseline Pt scores 18/30 on 03/24/2015   Time 4   Period Weeks   Status Achieved   PT SHORT TERM GOAL #2   Title The patient will reduce TUG with cognitive task from 27.74 seconds to < 20 seconds to improve dual tasking    Baseline Pt performed in 15.8 seconds counting backwards by 3's.   Time 4   Period Weeks   Status Achieved   PT SHORT TERM GOAL #3   Title The patient will demonstrate the ability to perform single leg stance with eyes open for at least 10 seconds bilaterally to improve ability to navigate stairs in a safe and timely manner.   Baseline Patient able to maintain x 5 seconds.   Time 4   Period Weeks   Status Partially Met   PT SHORT TERM GOAL #4   Title The patient will initiate an HEP to maximize functional gains from physical therapy.   Baseline Met on 04/02/2015   Time 4   Period Weeks   Status Achieved         PT Long Term Goals - 04/02/15 1305    PT LONG TERM GOAL #1   Title The patient will improve FGA score from 14/30 to 23/30 to decrease risk of falls and improve functional and dynamic gait.   Baseline Met on 04/02/2015   Time 8   Period Weeks   Status Achieved   PT LONG TERM GOAL #2   Title The patient will demonstrate the ability to ambulate 250' over varying, unlevel terrains (ramps, curbs, stairs) independently  while performing cognitive task to demonstrate safety while dual tasking in community environments.   Baseline Met on 04/02/2015   Time 8   Period Weeks   Status Achieved   PT LONG TERM GOAL #3   Title The patient will ascend/descend 4 stairs mod I while using 1 railing in < 8 seconds to improve entrance/exit at home environment.   Baseline Performed in 7.9 seconds   Time 8   Period Weeks   Status Achieved   PT LONG TERM GOAL #4   Title The patient will demonstrate independence with HEP to maintain functional gains made during physical therapy.   Time 8   Period Weeks   Status Achieved        Remaining deficits: Decreased high level balance and coordination tasks.   Education / Equipment: HEP, community resources for [employer], home walking program.  Plan: Patient agrees to discharge.  Patient goals were met. Patient is being discharged due to meeting the stated rehab goals.  ?????        Thank you for the referral of this patient. Margretta Ditty, MPT   Griffin Dewilde 04/02/2015, 2:31 PM  [facility] [address] [place], [place], [address] Phone: [phone]   Fax:  [phone]

## 2015-04-02 NOTE — Therapy (Signed)
[facility] [address] [place], [place], [address] Phone: [phone]   Fax:  [phone]  Physical Therapy Treatment  Patient Details  Name: April Church MRN: [id number] Date of Birth: 1940/03/29 Referring Provider: Penni Bombard  Encounter Date: 04/02/2015      PT End of Session - 04/02/15 1249    Visit Number 4   Number of Visits 16   Date for PT Re-Evaluation 05/06/15   Authorization Type G code due every 10th visit   PT Start Time 1245   PT Stop Time 1315   PT Time Calculation (min) 30 min   Activity Tolerance Patient tolerated treatment well   Behavior During Therapy [facility] for tasks assessed/performed      Past Medical History  Diagnosis Date  . Tremors of nervous system     hands  . GERD (gastroesophageal reflux disease)   . 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 Cyst of finger 11/2011    annular cyst right long finger  . Hypertension     under control, has been on med. x 2 yrs.  . Dental crowns present   . Asthma     daily inhaler  . Hyperlipidemia   . PONV (postoperative nausea and vomiting)   . Frequency of urination   . Seasonal allergies   . Dermatitis   . Breast cancer ([place])     left- radiation and surgery -dx. 2011- no further tx. now- [name] , [name]  . Hemorrhoid     Past Surgical History  Procedure Laterality Date  . Nasal sinus surgery      x 2  . Foot surgery  06/22/11    left  . Knee arthroscopy  03/12/2005    right  . Nephrectomy living donor Left 04/2008    donated to spouse 2010([facility])  . Appendectomy  age 81  . Tumor excision  age 28    right arm  . Cataract extraction, bilateral    . Breast surgery  1999    reduction  . Breast lumpectomy  06/16/2009    left; SLN bx.  . Breast lumpectomy  07/01/2009    re-excision  . Knee arthroscopy      left  . Trigger finger release  12/16/2011    Procedure: RELEASE TRIGGER FINGER/A-1  PULLEY;  Surgeon: Tennis Must, MD;  Location: [facility];  Service: Orthopedics;  Laterality: Right;  RIGHT LONG FINGER TRIGGER RELEASE & ANNULAR CYST EXCISION  . Anterior cervical decompression/discectomy fusion 4 levels N/A 11/14/2013    Procedure: ANTERIOR CERVICAL DECOMPRESSION/DISCECTOMY FUSION 4 LEVELS;  Surgeon: Newman Pies, MD;  Location: [facility];  Service: Neurosurgery;  Laterality: N/A;  C34 C45 C56 C67 anterior cervical fusion with interbody prosthesis plating and  bonegraft  . Colonoscopy with propofol N/A 12/03/2014    Procedure: COLONOSCOPY WITH PROPOFOL;  Surgeon: [name], MD;  Location: [facility];  Service: Endoscopy;  Laterality: N/A;    There were no vitals filed for this visit.  Visit Diagnosis:  Abnormality of gait  Decreased coordination  Unsteadiness      Subjective Assessment - 04/02/15 1248    Subjective The patient reports that she tolerated walking on the treadmill and outdoors well, without pain in the left knee.  She feels that her left knee bothers her more at night when trying to get comfortable.   Patient Stated Goals "I would like to see some  improvement in my voice."   Currently in Pain? No/denies            [facility] PT Assessment - 04/02/15 1259    Functional Gait  Assessment   Gait assessed  Yes   Gait Level Surface Walks 20 ft in less than 5.5 sec, no assistive devices, good speed, no evidence for imbalance, normal gait pattern, deviates no more than 6 in outside of the 12 in walkway width.   Change in Gait Speed Able to change speed, demonstrates mild gait deviations, deviates 6-10 in outside of the 12 in walkway width, or no gait deviations, unable to achieve a major change in velocity, or uses a change in velocity, or uses an assistive device.   Gait with Horizontal Head Turns Performs head turns smoothly with slight change in gait velocity (eg, minor disruption to smooth gait path), deviates 6-10 in outside 12  in walkway width, or uses an assistive device.   Gait with Vertical Head Turns Performs head turns with no change in gait. Deviates no more than 6 in outside 12 in walkway width.   Gait and Pivot Turn Pivot turns safely within 3 sec and stops quickly with no loss of balance.   Step Over Obstacle Is able to step over one shoe box (4.5 in total height) but must slow down and adjust steps to clear box safely. May require verbal cueing.   Gait with Narrow Base of Support Is able to ambulate for 10 steps heel to toe with no staggering.   Gait with Eyes Closed Walks 20 ft, uses assistive device, slower speed, mild gait deviations, deviates 6-10 in outside 12 in walkway width. Ambulates 20 ft in less than 9 sec but greater than 7 sec.   Ambulating Backwards Walks 20 ft, uses assistive device, slower speed, mild gait deviations, deviates 6-10 in outside 12 in walkway width.   Steps Alternating feet, must use rail.   Total Score 23      Gait: FGA=23/30 Community gait activities without loss of balance on unlevel surfaces independently  NEUROMUSCULAR RE-EDUCATION: Reviewed PWR! HEP and patient tolerating well. See prior handouts--all reviewed.        PT Education - 04/02/15 1428    Education provided Yes   Education Details reviewed HEP   Person(s) Educated Patient   Methods Explanation;Demonstration   Comprehension Verbalized understanding;Returned demonstration          PT Short Term Goals - 04/02/15 1310    PT SHORT TERM GOAL #1   Title The patient will improve FGA score from 14/30 to 18/30 to decrease risk of falls and improve dynamic and functional gait.   Baseline Pt scores 18/30 on 03/24/2015   Time 4   Period Weeks   Status Achieved   PT SHORT TERM GOAL #2   Title The patient will reduce TUG with cognitive task from 27.74 seconds to < 20 seconds to improve dual tasking    Baseline Pt performed in 15.8 seconds counting backwards by 3's.   Time 4   Period Weeks   Status  Achieved   PT SHORT TERM GOAL #3   Title The patient will demonstrate the ability to perform single leg stance with eyes open for at least 10 seconds bilaterally to improve ability to navigate stairs in a safe and timely manner.   Baseline Patient able to maintain x 5 seconds.   Time 4   Period Weeks   Status Partially Met   PT SHORT TERM  GOAL #4   Title The patient will initiate an HEP to maximize functional gains from physical therapy.   Baseline Met on 04/02/2015   Time 4   Period Weeks   Status Achieved           PT Long Term Goals - 04/02/15 1305    PT LONG TERM GOAL #1   Title The patient will improve FGA score from 14/30 to 23/30 to decrease risk of falls and improve functional and dynamic gait.   Baseline Met on 04/02/2015   Time 8   Period Weeks   Status Achieved   PT LONG TERM GOAL #2   Title The patient will demonstrate the ability to ambulate 250' over varying, unlevel terrains (ramps, curbs, stairs) independently while performing cognitive task to demonstrate safety while dual tasking in community environments.   Baseline Met on 04/02/2015   Time 8   Period Weeks   Status Achieved   PT LONG TERM GOAL #3   Title The patient will ascend/descend 4 stairs mod I while using 1 railing in < 8 seconds to improve entrance/exit at home environment.   Baseline Performed in 7.9 seconds   Time 8   Period Weeks   Status Achieved   PT LONG TERM GOAL #4   Title The patient will demonstrate independence with HEP to maintain functional gains made during physical therapy.   Time 8   Period Weeks   Status Achieved               Plan - 04/02/15 1429    Clinical Impression Statement The patient met STGs and LTGs.  She is tolerating HEP well.  PT discharged.   PT Next Visit Plan Discharge.   Consulted and Agree with Plan of Care Patient        Problem List Patient Active Problem List   Diagnosis Date Noted  . Cervical spondylosis with myelopathy and radiculopathy  11/14/2013  . Essential tremor 06/13/2013  . Cervical spondylosis without myelopathy 06/13/2013  . Breast cancer of lower-outer quadrant of left female breast ([place]) 09/10/2010    ,, PT 04/02/2015, 2:29 PM  [facility] [address] [place], [place], [address] Phone: [phone]   Fax:  [phone]  Name: LIBBI TOWNER MRN: [id number] Date of Birth: 11/29/1940

## 2015-04-03 DIAGNOSIS — J385 Laryngeal spasm: Secondary | ICD-10-CM | POA: Diagnosis not present

## 2015-04-03 DIAGNOSIS — R490 Dysphonia: Secondary | ICD-10-CM | POA: Diagnosis not present

## 2015-04-04 ENCOUNTER — Ambulatory Visit: Payer: Medicare Other | Admitting: Rehabilitative and Restorative Service Providers"

## 2015-04-07 DIAGNOSIS — M1712 Unilateral primary osteoarthritis, left knee: Secondary | ICD-10-CM | POA: Diagnosis not present

## 2015-04-08 ENCOUNTER — Encounter: Payer: Medicare Other | Admitting: Occupational Therapy

## 2015-04-08 ENCOUNTER — Encounter: Payer: Medicare Other | Admitting: Speech Pathology

## 2015-04-08 ENCOUNTER — Ambulatory Visit: Payer: Medicare Other | Admitting: Rehabilitative and Restorative Service Providers"

## 2015-04-08 DIAGNOSIS — R490 Dysphonia: Secondary | ICD-10-CM | POA: Diagnosis not present

## 2015-04-08 DIAGNOSIS — J385 Laryngeal spasm: Secondary | ICD-10-CM | POA: Diagnosis not present

## 2015-04-09 ENCOUNTER — Ambulatory Visit: Payer: Medicare Other | Admitting: Occupational Therapy

## 2015-04-09 DIAGNOSIS — R258 Other abnormal involuntary movements: Secondary | ICD-10-CM | POA: Diagnosis not present

## 2015-04-09 DIAGNOSIS — G250 Essential tremor: Secondary | ICD-10-CM | POA: Diagnosis not present

## 2015-04-09 DIAGNOSIS — R29898 Other symptoms and signs involving the musculoskeletal system: Secondary | ICD-10-CM | POA: Diagnosis not present

## 2015-04-09 DIAGNOSIS — R2681 Unsteadiness on feet: Secondary | ICD-10-CM | POA: Diagnosis not present

## 2015-04-09 DIAGNOSIS — R251 Tremor, unspecified: Secondary | ICD-10-CM

## 2015-04-09 DIAGNOSIS — R269 Unspecified abnormalities of gait and mobility: Secondary | ICD-10-CM | POA: Diagnosis not present

## 2015-04-09 DIAGNOSIS — R279 Unspecified lack of coordination: Secondary | ICD-10-CM | POA: Diagnosis not present

## 2015-04-09 NOTE — Patient Instructions (Signed)
Coordination Exercises  Perform the following exercises for 20 minutes 1 times per day. Perform with both hand(s). Perform using big movements.   Flipping Cards: Place deck of cards on the table. Flip cards over by opening your hand big to grasp and then turn your palm up big.  Deal cards: Hold 1/2 or whole deck in your hand. Use thumb to push card off top of deck with one big push.  Rotate ball with fingertips: Pick up with fingers/thumb and move as much as you can with each turn/movement (clockwise and counter-clockwise).  Toss ball from one hand to the other: Toss big/high.  Toss ball in the air and catch with the same hand: Toss big/high.  Pick up coins and place in coin bank or container: Pick up with big, intentional movements. Do not drag coin to the edge.  Pick up coins and stack one at a time: Pick up with big, intentional movements. Do not drag coin to the edge. (5-10 in a stack)  Pick up 5-10 coins one at a time and hold in palm. Then, move coins from palm to fingertips one at time and place in coin bank/container.  Pick up 5-10 coins one at a time and hold in palm. Then, move coins from palm to fingertips one at a time to stack.  Practice writing: Slow down, write big, and focus on forming each letter.  Perform "Flicks"/hand stretches (PWR! Hands): Close hands then flick out your fingers with focus on opening hands, pulling wrists back, and extending elbows like you are pushing. 

## 2015-04-10 DIAGNOSIS — Z0100 Encounter for examination of eyes and vision without abnormal findings: Secondary | ICD-10-CM | POA: Diagnosis not present

## 2015-04-10 DIAGNOSIS — Z961 Presence of intraocular lens: Secondary | ICD-10-CM | POA: Diagnosis not present

## 2015-04-10 DIAGNOSIS — H02831 Dermatochalasis of right upper eyelid: Secondary | ICD-10-CM | POA: Diagnosis not present

## 2015-04-10 DIAGNOSIS — H02834 Dermatochalasis of left upper eyelid: Secondary | ICD-10-CM | POA: Diagnosis not present

## 2015-04-10 NOTE — Therapy (Signed)
[facility] [address] [place], [place], [address] Phone: [phone]   Fax:  [phone]  Occupational Therapy Treatment  Patient Details  Name: April Church MRN: [id number] Date of Birth: [birth year] Referring Provider: Dr. Andrey Spearman  Encounter Date: 04/09/2015      OT End of Session - 04/10/15 2040    Visit Number 2   Number of Visits 13   Date for OT Re-Evaluation 05/09/15   Authorization Type Medicare primary, BCBS secondary (g-code needed)   Authorization Time Period 03/25/15-05/23/15   Authorization - Visit Number 2   Authorization - Number of Visits 10   OT Start Time 1405   OT Stop Time 1445   OT Time Calculation (min) 40 min      Past Medical History  Diagnosis Date  . Tremors of nervous system     hands  . GERD (gastroesophageal reflux disease)   . 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 Cyst of finger 11/2011    annular cyst right long finger  . Hypertension     under control, has been on med. x 2 yrs.  . Dental crowns present   . Asthma     daily inhaler  . Hyperlipidemia   . PONV (postoperative nausea and vomiting)   . Frequency of urination   . Seasonal allergies   . Dermatitis   . Breast cancer ([place])     left- radiation and surgery -dx. 2011- no further tx. now- [name] , [name]  . Hemorrhoid     Past Surgical History  Procedure Laterality Date  . Nasal sinus surgery      x 2  . Foot surgery  06/22/11    left  . Knee arthroscopy  03/12/2005    right  . Nephrectomy living donor Left 04/2008    donated to spouse 2010([facility])  . Appendectomy  age 39  . Tumor excision  age 58    right arm  . Cataract extraction, bilateral    . Breast surgery  1999    reduction  . Breast lumpectomy  06/16/2009    left; SLN bx.  . Breast lumpectomy  07/01/2009    re-excision  . Knee arthroscopy      left  . Trigger finger release  12/16/2011    Procedure:  RELEASE TRIGGER FINGER/A-1 PULLEY;  Surgeon: Tennis Must, MD;  Location: [facility];  Service: Orthopedics;  Laterality: Right;  RIGHT LONG FINGER TRIGGER RELEASE & ANNULAR CYST EXCISION  . Anterior cervical decompression/discectomy fusion 4 levels N/A 11/14/2013    Procedure: ANTERIOR CERVICAL DECOMPRESSION/DISCECTOMY FUSION 4 LEVELS;  Surgeon: Newman Pies, MD;  Location: [facility];  Service: Neurosurgery;  Laterality: N/A;  C34 C45 C56 C67 anterior cervical fusion with interbody prosthesis plating and  bonegraft  . Colonoscopy with propofol N/A 12/03/2014    Procedure: COLONOSCOPY WITH PROPOFOL;  Surgeon: [name], MD;  [facility];  Service: Endoscopy;  Laterality: N/A;    There were no vitals filed for this visit.  Visit Diagnosis:  Bradykinesia  Rigidity  Essential tremor  Tremor      Subjective Assessment - 04/09/15 1404    Pertinent History benign essential tremor mostly in RUE (and voice) for >107yrs; cervical decompression 10/2014   Patient Stated Goals improve use of hands, writing, using utensils, care for herself/husband (who has medical issues)   Currently in Pain? No/denies  Treatment:PWR! Basic 4 in seated 10 reps each. Eduction regarding coordination HEP, pt returned demonstration Education regarding tips for feeding(ie: stabilize elbow, better grasp), handwriting strategies, pt improved with felt tip pen.                       OT Education - 04/10/15 2039    Education provided Yes   Education Details coordination HEP   Person(s) Educated Patient   Methods Explanation;Demonstration   Comprehension Verbalized understanding;Returned demonstration          OT Short Term Goals - 03/25/15 1342    OT SHORT TERM GOAL #1   Title Pt will be independent with HEP--check STGs 04/23/15   Time 4   Period Weeks   Status New   OT SHORT TERM GOAL #2   Title Pt will verbalize understanding of ways to prevent  future complications and appropriate community resources.   Time 4   Period Weeks   Status New           OT Long Term Goals - 03/25/15 1343    OT LONG TERM GOAL #1   Title Pt will verbalize understanding of strategies/AE to incr ease with ADLs (including eating, writing, buttoning).--check LTGs 05/09/15   Time 6   Period Weeks   Status New   OT LONG TERM GOAL #2   Title Pt will improve coordination for ADLs (fastening jewerly) as shown by improving time on 9-hole peg test by at least 5sec with R hand.   Baseline 39.56sec   Time 6   Period Weeks   Status New   OT LONG TERM GOAL #3   Title Pt will improve coordination/functional reaching for ADLs as shown by improving score on box and blocks by at least 4 bilaterally.   Baseline 39 blocks bilaterally   Time 6   Period Weeks   Status New   OT LONG TERM GOAL #4   Title Pt will button/unbutton 3 buttons on tabletop in 36sec or less.   Baseline 41.31sec   Time 6   Period Weeks   Status New   OT LONG TERM GOAL #5   Title Pt will improve time on PPT#2 (simulated eating) to 13sec or less using AE/strategies prn.   Baseline 16.28sec   Time 6   Period Weeks   Status New               Plan - 04/09/15 1413    Clinical Impression Statement Pt is progressing towards goals for coordination. Pt verbalizes understanding of HEP for coordination   Pt will benefit from skilled therapeutic intervention in order to improve on the following deficits (Retired) Impaired UE functional use;Decreased knowledge of use of DME;Decreased balance;Impaired tone;Decreased coordination   Rehab Potential Good   OT Frequency 2x / week   OT Duration 6 weeks   OT Treatment/Interventions Moist Heat;Visual/perceptual remediation/compensation;Cognitive remediation/compensation;Fluidtherapy;Electrical Stimulation;Parrafin;DME and/or AE instruction;Cryotherapy;Therapeutic activities;Passive range of motion;Contrast Bath;Therapeutic exercises;Energy  conservation;Splinting;Manual Therapy;Neuromuscular education;Ultrasound;Self-care/ADL training;Therapeutic exercise;Functional Mobility Training;Patient/family education   Plan reinforce coordination tasks   OT Home Exercise Plan issued coordination HEP   Consulted and Agree with Plan of Care Patient        Problem List Patient Active Problem List   Diagnosis Date Noted  . Cervical spondylosis with myelopathy and radiculopathy 11/14/2013  . Essential tremor 06/13/2013  . Cervical spondylosis without myelopathy 06/13/2013  . Breast cancer of lower-outer quadrant of left female breast ([place]) 09/10/2010    [name],[name] 04/10/2015, 8:41  PM  [facility] [address] [place], [place], [address] Phone: [phone]   Fax:  [phone]  Name: April Church MRN: [id number] Date of Birth: [birth year]

## 2015-04-11 ENCOUNTER — Ambulatory Visit: Payer: Medicare Other | Admitting: Occupational Therapy

## 2015-04-11 ENCOUNTER — Ambulatory Visit: Payer: Medicare Other | Admitting: Rehabilitative and Restorative Service Providers"

## 2015-04-11 DIAGNOSIS — R258 Other abnormal involuntary movements: Secondary | ICD-10-CM | POA: Diagnosis not present

## 2015-04-11 DIAGNOSIS — R251 Tremor, unspecified: Secondary | ICD-10-CM

## 2015-04-11 DIAGNOSIS — R29898 Other symptoms and signs involving the musculoskeletal system: Secondary | ICD-10-CM | POA: Diagnosis not present

## 2015-04-11 DIAGNOSIS — R2681 Unsteadiness on feet: Secondary | ICD-10-CM | POA: Diagnosis not present

## 2015-04-11 DIAGNOSIS — R278 Other lack of coordination: Secondary | ICD-10-CM

## 2015-04-11 DIAGNOSIS — G250 Essential tremor: Secondary | ICD-10-CM | POA: Diagnosis not present

## 2015-04-11 DIAGNOSIS — R279 Unspecified lack of coordination: Secondary | ICD-10-CM | POA: Diagnosis not present

## 2015-04-11 DIAGNOSIS — R269 Unspecified abnormalities of gait and mobility: Secondary | ICD-10-CM | POA: Diagnosis not present

## 2015-04-11 NOTE — Therapy (Signed)
[facility] [address] [place], [place], [address] Phone: [phone]   Fax:  [phone]  Occupational Therapy Treatment  Patient Details  Name: April Church MRN: [id number] Date of Birth: 12/27/1940 Referring Provider: Dr. Andrey Spearman  Encounter Date: 04/11/2015      OT End of Session - 04/11/15 1210    Visit Number 3   Number of Visits 13   Date for OT Re-Evaluation 05/09/15   Authorization Type Medicare primary, BCBS secondary (g-code needed)   Authorization Time Period 03/25/15-05/23/15   Authorization - Visit Number 2   Authorization - Number of Visits 10   OT Start Time 1140   OT Stop Time 1225   OT Time Calculation (min) 45 min   Activity Tolerance Patient tolerated treatment well   Behavior During Therapy [facility] for tasks assessed/performed      Past Medical History  Diagnosis Date  . Tremors of nervous system     hands  . GERD (gastroesophageal reflux disease)   . 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 Cyst of finger 11/2011    annular cyst right long finger  . Hypertension     under control, has been on med. x 2 yrs.  . Dental crowns present   . Asthma     daily inhaler  . Hyperlipidemia   . PONV (postoperative nausea and vomiting)   . Frequency of urination   . Seasonal allergies   . Dermatitis   . Breast cancer ([place])     left- radiation and surgery -dx. 2011- no further tx. now- [name] , [name]  . Hemorrhoid     Past Surgical History  Procedure Laterality Date  . Nasal sinus surgery      x 2  . Foot surgery  06/22/11    left  . Knee arthroscopy  03/12/2005    right  . Nephrectomy living donor Left 04/2008    donated to spouse 2010([facility])  . Appendectomy  age 56  . Tumor excision  age 77    right arm  . Cataract extraction, bilateral    . Breast surgery  1999    reduction  . Breast lumpectomy  06/16/2009    left; SLN bx.  . Breast lumpectomy   07/01/2009    re-excision  . Knee arthroscopy      left  . Trigger finger release  12/16/2011    Procedure: RELEASE TRIGGER FINGER/A-1 PULLEY;  Surgeon: Tennis Must, MD;  Location: [facility];  Service: Orthopedics;  Laterality: Right;  RIGHT LONG FINGER TRIGGER RELEASE & ANNULAR CYST EXCISION  . Anterior cervical decompression/discectomy fusion 4 levels N/A 11/14/2013    Procedure: ANTERIOR CERVICAL DECOMPRESSION/DISCECTOMY FUSION 4 LEVELS;  Surgeon: Newman Pies, MD;  Location: [facility];  Service: Neurosurgery;  Laterality: N/A;  C34 C45 C56 C67 anterior cervical fusion with interbody prosthesis plating and  bonegraft  . Colonoscopy with propofol N/A 12/03/2014    Procedure: COLONOSCOPY WITH PROPOFOL;  Surgeon: [name], MD;  [facility];  Service: Endoscopy;  Laterality: N/A;    There were no vitals filed for this visit.  Visit Diagnosis:  Decreased coordination  Bradykinesia  Rigidity  Essential tremor  Tremor      Subjective Assessment - 04/11/15 1207    Pertinent History benign essential tremor mostly in RUE (and voice) for >21yrs; cervical decompression 10/2014   Patient Stated Goals improve use of hands,  writing, using utensils, care for herself/husband (who has medical issues)   Currently in Pain? No/denies   Multiple Pain Sites No         PWR! hands followed by placing removing grooved pegs from pegboard with right and left hands for improved fine motor coordination, min-mod difficulty                [facility]) - 04/11/15 1211    PWR! exercises Moves in [place];Moves in prone   PWR! Up 10   PWR! Rock 10   PWR! Twist 10   PWR! Step 10   Comments modified quadraped over tabletop, min v.c./ demonstration   PWR! Up 10   PWR! Rock 10   PWR! Twist 10   PWR! Step 10   Comments min-mod v.c. initally   PWR! Up x10             OT Education - 04/11/15 1207    Education provided Yes   Education Details  PWR! prone and modified quadraped   Person(s) Educated Patient   Methods Explanation;Demonstration   Comprehension Verbalized understanding;Returned demonstration          OT Short Term Goals - 03/25/15 1342    OT SHORT TERM GOAL #1   Title Pt will be independent with HEP--check STGs 04/23/15   Time 4   Period Weeks   Status New   OT SHORT TERM GOAL #2   Title Pt will verbalize understanding of ways to prevent future complications and appropriate community resources.   Time 4   Period Weeks   Status New           OT Long Term Goals - 03/25/15 1343    OT LONG TERM GOAL #1   Title Pt will verbalize understanding of strategies/AE to incr ease with ADLs (including eating, writing, buttoning).--check LTGs 05/09/15   Time 6   Period Weeks   Status New   OT LONG TERM GOAL #2   Title Pt will improve coordination for ADLs (fastening jewerly) as shown by improving time on 9-hole peg test by at least 5sec with R hand.   Baseline 39.56sec   Time 6   Period Weeks   Status New   OT LONG TERM GOAL #3   Title Pt will improve coordination/functional reaching for ADLs as shown by improving score on box and blocks by at least 4 bilaterally.   Baseline 39 blocks bilaterally   Time 6   Period Weeks   Status New   OT LONG TERM GOAL #4   Title Pt will button/unbutton 3 buttons on tabletop in 36sec or less.   Baseline 41.31sec   Time 6   Period Weeks   Status New   OT LONG TERM GOAL #5   Title Pt will improve time on PPT#2 (simulated eating) to 13sec or less using AE/strategies prn.   Baseline 16.28sec   Time 6   Period Weeks   Status New               Plan - 04/11/15 1208    Clinical Impression Statement Pt is progressing towards goals. She verbalizes understanding of PWR! modified quadraped, and prone. she can benefit fro review.   Rehab Potential Good   OT Frequency 2x / week   OT Duration 6 weeks   OT Treatment/Interventions Moist Heat;Visual/perceptual  remediation/compensation;Cognitive remediation/compensation;Fluidtherapy;Electrical Stimulation;Parrafin;DME and/or AE instruction;Cryotherapy;Therapeutic activities;Passive range of motion;Contrast Bath;Therapeutic exercises;Energy conservation;Splinting;Manual Therapy;Neuromuscular education;Ultrasound;Self-care/ADL training;Therapeutic exercise;Functional Mobility Training;Patient/family education   Plan reinforce  PWR! prone/ modified quadraped   OT Home Exercise Plan issued coordination HEP, PWR! prone, modified quadraped   Consulted and Agree with Plan of Care Patient        Problem List Patient Active Problem List   Diagnosis Date Noted  . Cervical spondylosis with myelopathy and radiculopathy 11/14/2013  . Essential tremor 06/13/2013  . Cervical spondylosis without myelopathy 06/13/2013  . Breast cancer of lower-outer quadrant of left female breast ([place]) 09/10/2010    Glynis Hunsucker 04/11/2015, 12:53 PM Theone Murdoch, OTR/L [phone] Phone: [phone] 12:55 PM 04/11/2015 [facility] [address] [place], [place], [address] Phone: [phone]   Fax:  [phone]  Name: April Church MRN: [id number] Date of Birth: 03-14-1940

## 2015-04-14 DIAGNOSIS — M1712 Unilateral primary osteoarthritis, left knee: Secondary | ICD-10-CM | POA: Diagnosis not present

## 2015-04-16 ENCOUNTER — Ambulatory Visit: Payer: Medicare Other | Admitting: Occupational Therapy

## 2015-04-16 DIAGNOSIS — R2681 Unsteadiness on feet: Secondary | ICD-10-CM

## 2015-04-16 DIAGNOSIS — R258 Other abnormal involuntary movements: Secondary | ICD-10-CM

## 2015-04-16 DIAGNOSIS — R279 Unspecified lack of coordination: Secondary | ICD-10-CM | POA: Diagnosis not present

## 2015-04-16 DIAGNOSIS — R251 Tremor, unspecified: Secondary | ICD-10-CM

## 2015-04-16 DIAGNOSIS — G250 Essential tremor: Secondary | ICD-10-CM

## 2015-04-16 DIAGNOSIS — R278 Other lack of coordination: Secondary | ICD-10-CM

## 2015-04-16 DIAGNOSIS — R269 Unspecified abnormalities of gait and mobility: Secondary | ICD-10-CM | POA: Diagnosis not present

## 2015-04-16 DIAGNOSIS — R29898 Other symptoms and signs involving the musculoskeletal system: Secondary | ICD-10-CM | POA: Diagnosis not present

## 2015-04-16 NOTE — Patient Instructions (Signed)
Performing Daily Activities with Big Movements  Pick at least one activity a day and perform with BIG, DELIBERATE movements/effort. This can make the activity easier and turn daily activities into exercise!  If you are standing during the activity, make sure to keep feet apart and stand with good/big/PWR! UP posture.  Examples:  Dressing - Push arms in sleeves, twist when putting on jacket, push foot into pants, open hands to pull down shirt/put on socks/pull up pants  Bathing - Wash/dry with long strokes  Brushing your teeth - Big, slow movements  Cutting food - Long deliberate cuts  Opening jar/bottle - Move as much as you can with each turn  Picking up a cup/bottle - Open hand up big and get object all the way in palm  Hanging up clothes/getting clothes down from closet - Reach with big effort  Putting away groceries/dishes - Reach with big effort  Wiping counter/table - Move in big, long strokes  Stirring while cooking - Exaggerate movement  Cleaning windows - Move in big, long strokes  Sweeping - Move arms in big, long strokes  Vacuuming - Push with big movement  Folding clothes - Exaggerate arm movements  Washing car - Move in big, long strokes  Raking - Move arms in big, long strokes  Changing light bulb - Move as much as you can with each turn  Using a screwdriver - Move as much as you can with each turn  Walking into a store/restaurant - Walk with big steps, swing arms if able  Standing up from a chair/recliner/sofa - Scoot forward, lean forward, and stand with big effort  Putting on seatbelt by reaching back with hand open and twisting and turning head.

## 2015-04-16 NOTE — Therapy (Signed)
[facility] [address] [place], [place], [address] Phone: [phone]   Fax:  [phone]  Occupational Therapy Treatment  Patient Details  Name: April Church MRN: [id number] Date of Birth: 1940/10/03 Referring Provider: Dr. Andrey Spearman  Encounter Date: 04/16/2015      OT End of Session - 04/16/15 1318    Visit Number 4   Number of Visits 13   Date for OT Re-Evaluation 05/09/15   Authorization Type Medicare primary, BCBS secondary (g-code needed)   Authorization Time Period 03/25/15-05/23/15   Authorization - Visit Number 4   Authorization - Number of Visits 10   OT Start Time 1100   OT Stop Time 1150   OT Time Calculation (min) 50 min   Activity Tolerance Patient tolerated treatment well   Behavior During Therapy [facility] for tasks assessed/performed      Past Medical History  Diagnosis Date  . Tremors of nervous system     hands  . GERD (gastroesophageal reflux disease)   . 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 Cyst of finger 11/2011    annular cyst right long finger  . Hypertension     under control, has been on med. x 2 yrs.  . Dental crowns present   . Asthma     daily inhaler  . Hyperlipidemia   . PONV (postoperative nausea and vomiting)   . Frequency of urination   . Seasonal allergies   . Dermatitis   . Breast cancer ([place])     left- radiation and surgery -dx. 2011- no further tx. now- [name] , [name]  . Hemorrhoid     Past Surgical History  Procedure Laterality Date  . Nasal sinus surgery      x 2  . Foot surgery  06/22/11    left  . Knee arthroscopy  03/12/2005    right  . Nephrectomy living donor Left 04/2008    donated to spouse 2010([facility])  . Appendectomy  age 74  . Tumor excision  age 66    right arm  . Cataract extraction, bilateral    . Breast surgery  1999    reduction  . Breast lumpectomy  06/16/2009    left; SLN bx.  . Breast lumpectomy   07/01/2009    re-excision  . Knee arthroscopy      left  . Trigger finger release  12/16/2011    Procedure: RELEASE TRIGGER FINGER/A-1 PULLEY;  Surgeon: Tennis Must, MD;  Location: [facility];  Service: Orthopedics;  Laterality: Right;  RIGHT LONG FINGER TRIGGER RELEASE & ANNULAR CYST EXCISION  . Anterior cervical decompression/discectomy fusion 4 levels N/A 11/14/2013    Procedure: ANTERIOR CERVICAL DECOMPRESSION/DISCECTOMY FUSION 4 LEVELS;  Surgeon: Newman Pies, MD;  Location: [facility];  Service: Neurosurgery;  Laterality: N/A;  C34 C45 C56 C67 anterior cervical fusion with interbody prosthesis plating and  bonegraft  . Colonoscopy with propofol N/A 12/03/2014    Procedure: COLONOSCOPY WITH PROPOFOL;  Surgeon: [name], MD;  Location: [facility];  Service: Endoscopy;  Laterality: N/A;    There were no vitals filed for this visit.  Visit Diagnosis:  Bradykinesia  Rigidity  Essential tremor  Tremor  Decreased coordination  Unsteadiness      Subjective Assessment - 04/16/15 1104    Subjective  Pt reports performing HEP at home.  Pt is going to [facility] for ST and may get Botox to help  with vocal tremor.   Pertinent History benign essential tremor mostly in RUE (and voice) for >30yrs; cervical decompression 10/2014   Patient Stated Goals improve use of hands, writing, using utensils, care for herself/husband (who has medical issues)   Currently in Pain? No/denies                      OT Treatments/Exercises (OP) - 04/16/15 0001    ADLs   ADL Comments Pt instructed in big movement strategies for ADLs and discussed compensation strategies for tremors.  pt verbalized understanding.  Also discussed importance of standing with larger base of support and trunk movmements to prevent future complications.  Pt verbalized understanding.           PWR [facility]) - 04/16/15 1417    PWR! exercises Moves in [employer];Moves in prone   PWR! Up 10    PWR! Rock 10   PWR! Twist 10 to each side   PWR! Step 10 to each side   Comments modified quadraped, min cueing initially for movement   PWR! Up 20   PWR! Rock 10 to each side   PWR! Twist 10 to each side   PWR! Step 10 to each side   Comments min cueing for movement (prone)             OT Education - 04/16/15 1414    Education Details PWR! prone and modified quadraped;  Big movement strategies for ADLs (to incr ease and prevent future complications)   Person(s) Educated Patient   Methods Explanation;Demonstration;Verbal cues;Handout   Comprehension Verbalized understanding;Returned demonstration;Verbal cues required;Need further instruction          OT Short Term Goals - 03/25/15 1342    OT SHORT TERM GOAL #1   Title Pt will be independent with HEP--check STGs 04/23/15   Time 4   Period Weeks   Status New   OT SHORT TERM GOAL #2   Title Pt will verbalize understanding of ways to prevent future complications and appropriate community resources.   Time 4   Period Weeks   Status New           OT Long Term Goals - 03/25/15 1343    OT LONG TERM GOAL #1   Title Pt will verbalize understanding of strategies/AE to incr ease with ADLs (including eating, writing, buttoning).--check LTGs 05/09/15   Time 6   Period Weeks   Status New   OT LONG TERM GOAL #2   Title Pt will improve coordination for ADLs (fastening jewerly) as shown by improving time on 9-hole peg test by at least 5sec with R hand.   Baseline 39.56sec   Time 6   Period Weeks   Status New   OT LONG TERM GOAL #3   Title Pt will improve coordination/functional reaching for ADLs as shown by improving score on box and blocks by at least 4 bilaterally.   Baseline 39 blocks bilaterally   Time 6   Period Weeks   Status New   OT LONG TERM GOAL #4   Title Pt will button/unbutton 3 buttons on tabletop in 36sec or less.   Baseline 41.31sec   Time 6   Period Weeks   Status New   OT LONG TERM GOAL #5   Title  Pt will improve time on PPT#2 (simulated eating) to 13sec or less using AE/strategies prn.   Baseline 16.28sec   Time 6   Period Weeks   Status New  Plan - 04/16/15 1420    Clinical Impression Statement Pt is progressing towards goals and demo improved movement amplitude when cued but needs cueing for sequencing movement/alternating initially.   Pt will benefit from skilled therapeutic intervention in order to improve on the following deficits (Retired) Impaired UE functional use;Decreased knowledge of use of DME;Decreased balance;Impaired tone;Decreased coordination   Rehab Potential Good   OT Frequency 2x / week   OT Duration 6 weeks   OT Treatment/Interventions Moist Heat;Visual/perceptual remediation/compensation;Cognitive remediation/compensation;Fluidtherapy;Electrical Stimulation;Parrafin;DME and/or AE instruction;Cryotherapy;Therapeutic activities;Passive range of motion;Contrast Bath;Therapeutic exercises;Energy conservation;Splinting;Manual Therapy;Neuromuscular education;Ultrasound;Self-care/ADL training;Therapeutic exercise;Functional Mobility Training;Patient/family education   Plan practice large amplitude movements in functional tasks (ADLs, IADLs, reaching), strategies for writing, eating, buttoning   OT Home Exercise Plan issued coordination HEP, PWR! prone, modified quadraped; 04/16/15 using big movement strategies for ADLs, initiated prevention of future complications   Consulted and Agree with Plan of Care Patient        Problem List Patient Active Problem List   Diagnosis Date Noted  . Cervical spondylosis with myelopathy and radiculopathy 11/14/2013  . Essential tremor 06/13/2013  . Cervical spondylosis without myelopathy 06/13/2013  . Breast cancer of lower-outer quadrant of left female breast ([place]) 09/10/2010    [facility] 04/16/2015, 2:23 PM  [facility] [address] [place], [place], [address] Phone: [phone]   Fax:  [phone]  Name: April Church MRN: [id number] Date of Birth: 1940/08/20  Vianne Bulls, OTR/L [facility] [address] [place], [place]  [id number] [phone] phone [phone] 04/16/2015 2:23 PM

## 2015-04-18 ENCOUNTER — Ambulatory Visit: Payer: Medicare Other | Admitting: Occupational Therapy

## 2015-04-18 DIAGNOSIS — R279 Unspecified lack of coordination: Secondary | ICD-10-CM

## 2015-04-18 DIAGNOSIS — R29898 Other symptoms and signs involving the musculoskeletal system: Secondary | ICD-10-CM

## 2015-04-18 DIAGNOSIS — G250 Essential tremor: Secondary | ICD-10-CM

## 2015-04-18 DIAGNOSIS — R269 Unspecified abnormalities of gait and mobility: Secondary | ICD-10-CM | POA: Diagnosis not present

## 2015-04-18 DIAGNOSIS — R258 Other abnormal involuntary movements: Secondary | ICD-10-CM

## 2015-04-18 DIAGNOSIS — R278 Other lack of coordination: Secondary | ICD-10-CM

## 2015-04-18 DIAGNOSIS — R2681 Unsteadiness on feet: Secondary | ICD-10-CM | POA: Diagnosis not present

## 2015-04-18 DIAGNOSIS — R251 Tremor, unspecified: Secondary | ICD-10-CM

## 2015-04-18 NOTE — Patient Instructions (Signed)
Compensation Strategies for Tremors  When eating, try the following Eat out of bowls, divided plates, or use a plate guard (available at a medical supply store) and eat with a spoon so that you have an edge to scoop up food. Try raising your plate so that there is less distance between the plate and mouth.Try stabilizing elbows on the tables or against your body. Use utensil with built-up/larger grips as they are easier to hold.  When writing, try the following: Stabilize forearm on the table. Take your time as rushing/being stressed can increase tremors. Try a felt-tipped pen, it does not glide as much.  Avoid gel pens ( they move to much ). Consider using pre-printed labels with your name and address (carry them with you when you go out) or you can get stamps with your address or signature on it. Use a small tape recorder to record messages/reminders for yourself. Use pens with bigger grips.  When brushing your teeth, putting on make-up, or styling hair, try the following: Use an electric toothbrush. Use items with built-up grips. Stabilize your elbows against your body or on the counter. Use long-handled brushes/combs. Use a hair dryer with a stand.  In general: Avoid stress, fatigue or rushing as this can increase tremors. Sit down for activities that require more control/coordination. Perform "flicks".  

## 2015-04-18 NOTE — Therapy (Signed)
[facility] [address] [place], [place], [address] Phone: [phone]   Fax:  [phone]  Occupational Therapy Treatment  Patient Details  Name: April Church MRN: [id number] Date of Birth: 07/17/1940 Referring Provider: Dr. Andrey Spearman  Encounter Date: 04/18/2015      OT End of Session - 04/18/15 1359    Visit Number 5   Number of Visits 13   Date for OT Re-Evaluation 05/09/15   Authorization Type Medicare primary, BCBS secondary (g-code needed)   Authorization Time Period 03/25/15-05/23/15   Authorization - Visit Number 5   Authorization - Number of Visits 10   OT Start Time 1320   OT Stop Time 1400   OT Time Calculation (min) 40 min   Activity Tolerance Patient tolerated treatment well   Behavior During Therapy [facility] for tasks assessed/performed      Past Medical History  Diagnosis Date  . Tremors of nervous system     hands  . GERD (gastroesophageal reflux disease)   . 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 Cyst of finger 11/2011    annular cyst right long finger  . Hypertension     under control, has been on med. x 2 yrs.  . Dental crowns present   . Asthma     daily inhaler  . Hyperlipidemia   . PONV (postoperative nausea and vomiting)   . Frequency of urination   . Seasonal allergies   . Dermatitis   . Breast cancer ([place])     left- radiation and surgery -dx. 2011- no further tx. now- [name] , [name]  . Hemorrhoid     Past Surgical History  Procedure Laterality Date  . Nasal sinus surgery      x 2  . Foot surgery  06/22/11    left  . Knee arthroscopy  03/12/2005    right  . Nephrectomy living donor Left 04/2008    donated to spouse 2010([facility])  . Appendectomy  age 16  . Tumor excision  age 37    right arm  . Cataract extraction, bilateral    . Breast surgery  1999    reduction  . Breast lumpectomy  06/16/2009    left; SLN bx.  . Breast lumpectomy   07/01/2009    re-excision  . Knee arthroscopy      left  . Trigger finger release  12/16/2011    Procedure: RELEASE TRIGGER FINGER/A-1 PULLEY;  Surgeon: Tennis Must, MD;  Location: [facility];  Service: Orthopedics;  Laterality: Right;  RIGHT LONG FINGER TRIGGER RELEASE & ANNULAR CYST EXCISION  . Anterior cervical decompression/discectomy fusion 4 levels N/A 11/14/2013    Procedure: ANTERIOR CERVICAL DECOMPRESSION/DISCECTOMY FUSION 4 LEVELS;  Surgeon: Newman Pies, MD;  Location: [facility];  Service: Neurosurgery;  Laterality: N/A;  C34 C45 C56 C67 anterior cervical fusion with interbody prosthesis plating and  bonegraft  . Colonoscopy with propofol N/A 12/03/2014    Procedure: COLONOSCOPY WITH PROPOFOL;  Surgeon: [name], MD;  [facility]: [facility];  Service: Endoscopy;  Laterality: N/A;    There were no vitals filed for this visit.  Visit Diagnosis:  Bradykinesia  Rigidity  Essential tremor  Tremor  Decreased coordination                Pt practiced writing using felt tip pen with improved legibility. Pt demonstrates improved ease with feeding with elbow stabilized on table for  feeding and use of foam grip. Pt returned demonstration of buttonhook with increased ease. Dynamic step and reach with trunk rotation to place graded clothespins on vertical antennae, min v.c. For large amplitude movements for simulated ADLs.            OT Education - 04/18/15 1419    Education provided Yes   Education Details compensation for tremors, strategies for handwriting, feeding, and use of button hook   Person(s) Educated Patient   Methods Explanation;Demonstration;Verbal cues;Handout   Comprehension Verbalized understanding;Returned demonstration          OT Short Term Goals - 03/25/15 1342    OT SHORT TERM GOAL #1   Title Pt will be independent with HEP--check STGs 04/23/15   Time 4   Period Weeks   Status New   OT SHORT TERM GOAL #2    Title Pt will verbalize understanding of ways to prevent future complications and appropriate community resources.   Time 4   Period Weeks   Status New           OT Long Term Goals - 03/25/15 1343    OT LONG TERM GOAL #1   Title Pt will verbalize understanding of strategies/AE to incr ease with ADLs (including eating, writing, buttoning).--check LTGs 05/09/15   Time 6   Period Weeks   Status New   OT LONG TERM GOAL #2   Title Pt will improve coordination for ADLs (fastening jewerly) as shown by improving time on 9-hole peg test by at least 5sec with R hand.   Baseline 39.56sec   Time 6   Period Weeks   Status New   OT LONG TERM GOAL #3   Title Pt will improve coordination/functional reaching for ADLs as shown by improving score on box and blocks by at least 4 bilaterally.   Baseline 39 blocks bilaterally   Time 6   Period Weeks   Status New   OT LONG TERM GOAL #4   Title Pt will button/unbutton 3 buttons on tabletop in 36sec or less.   Baseline 41.31sec   Time 6   Period Weeks   Status New   OT LONG TERM GOAL #5   Title Pt will improve time on PPT#2 (simulated eating) to 13sec or less using AE/strategies prn.   Baseline 16.28sec   Time 6   Period Weeks   Status New               Plan - 04/18/15 1415    Clinical Impression Statement Pt is progressing towards goals.She verbalizes understanding of strategies for compensation of tremors and adapted stretegies/ equipment for ADLs.   Pt will benefit from skilled therapeutic intervention in order to improve on the following deficits (Retired) Impaired UE functional use;Decreased knowledge of use of DME;Decreased balance;Impaired tone;Decreased coordination   Rehab Potential Good   OT Frequency 2x / week   OT Duration 6 weeks   OT Treatment/Interventions Moist Heat;Visual/perceptual remediation/compensation;Cognitive remediation/compensation;Fluidtherapy;Electrical Stimulation;Parrafin;DME and/or AE  instruction;Cryotherapy;Therapeutic activities;Passive range of motion;Contrast Bath;Therapeutic exercises;Energy conservation;Splinting;Manual Therapy;Neuromuscular education;Ultrasound;Self-care/ADL training;Therapeutic exercise;Functional Mobility Training;Patient/family education   Plan continue to practice large amplitude movements with ADLs/IADLS, try buttons with PWR! hands   OT Home Exercise Plan issued coordination HEP, PWR! prone, modified quadraped; 04/16/15 using big movement strategies for ADLs, initiated prevention of future complications, strategies for writing, eating buttoning        Problem List Patient Active Problem List   Diagnosis Date Noted  . Cervical spondylosis with myelopathy and radiculopathy 11/14/2013  .  Essential tremor 06/13/2013  . Cervical spondylosis without myelopathy 06/13/2013  . Breast cancer of lower-outer quadrant of left female breast ([place]) 09/10/2010    [facility] 04/18/2015, 2:20 PM  [facility] [address] [place], [place], [address] Phone: [phone]   Fax:  [phone]  Name: April Church MRN: [id number] Date of Birth: 12-18-1940

## 2015-04-21 DIAGNOSIS — M1712 Unilateral primary osteoarthritis, left knee: Secondary | ICD-10-CM | POA: Diagnosis not present

## 2015-04-22 ENCOUNTER — Ambulatory Visit: Payer: Medicare Other | Admitting: Occupational Therapy

## 2015-04-22 DIAGNOSIS — R258 Other abnormal involuntary movements: Secondary | ICD-10-CM | POA: Diagnosis not present

## 2015-04-22 DIAGNOSIS — G250 Essential tremor: Secondary | ICD-10-CM | POA: Diagnosis not present

## 2015-04-22 DIAGNOSIS — R29898 Other symptoms and signs involving the musculoskeletal system: Secondary | ICD-10-CM | POA: Diagnosis not present

## 2015-04-22 DIAGNOSIS — R279 Unspecified lack of coordination: Secondary | ICD-10-CM

## 2015-04-22 DIAGNOSIS — R278 Other lack of coordination: Secondary | ICD-10-CM

## 2015-04-22 DIAGNOSIS — R2681 Unsteadiness on feet: Secondary | ICD-10-CM | POA: Diagnosis not present

## 2015-04-22 DIAGNOSIS — R251 Tremor, unspecified: Secondary | ICD-10-CM

## 2015-04-22 DIAGNOSIS — R269 Unspecified abnormalities of gait and mobility: Secondary | ICD-10-CM | POA: Diagnosis not present

## 2015-04-22 NOTE — Therapy (Signed)
[facility] [address] [place], [place], [address] Phone: [phone]   Fax:  [phone]  Occupational Therapy Treatment  Patient Details  Name: April Church MRN: [id number] Date of Birth: 1940/09/28 Referring Provider: Dr. Andrey Spearman  Encounter Date: 04/22/2015      OT End of Session - 04/22/15 1541    Visit Number 6   Number of Visits 13   Date for OT Re-Evaluation 05/09/15   Authorization Type Medicare primary, BCBS secondary (g-code needed)   Authorization Time Period 03/25/15-05/23/15   Authorization - Visit Number 6   Authorization - Number of Visits 10   OT Start Time 1536   OT Stop Time 1629   OT Time Calculation (min) 53 min   Activity Tolerance Patient tolerated treatment well   Behavior During Therapy [facility] for tasks assessed/performed      Past Medical History  Diagnosis Date  . Tremors of nervous system     hands  . GERD (gastroesophageal reflux disease)   . 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 Cyst of finger 11/2011    annular cyst right long finger  . Hypertension     under control, has been on med. x 2 yrs.  . Dental crowns present   . Asthma     daily inhaler  . Hyperlipidemia   . PONV (postoperative nausea and vomiting)   . Frequency of urination   . Seasonal allergies   . Dermatitis   . Breast cancer ([place])     left- radiation and surgery -dx. 2011- no further tx. now- [name] , [name]  . Hemorrhoid     Past Surgical History  Procedure Laterality Date  . Nasal sinus surgery      x 2  . Foot surgery  06/22/11    left  . Knee arthroscopy  03/12/2005    right  . Nephrectomy living donor Left 04/2008    donated to spouse 2010([facility])  . Appendectomy  age 35  . Tumor excision  age 11    right arm  . Cataract extraction, bilateral    . Breast surgery  1999    reduction  . Breast lumpectomy  06/16/2009    left; SLN bx.  . Breast lumpectomy   07/01/2009    re-excision  . Knee arthroscopy      left  . Trigger finger release  12/16/2011    Procedure: RELEASE TRIGGER FINGER/A-1 PULLEY;  Surgeon: Tennis Must, MD;  Location: [facility];  Service: Orthopedics;  Laterality: Right;  RIGHT LONG FINGER TRIGGER RELEASE & ANNULAR CYST EXCISION  . Anterior cervical decompression/discectomy fusion 4 levels N/A 11/14/2013    Procedure: ANTERIOR CERVICAL DECOMPRESSION/DISCECTOMY FUSION 4 LEVELS;  Surgeon: Newman Pies, MD;  Location: [facility];  Service: Neurosurgery;  Laterality: N/A;  C34 C45 C56 C67 anterior cervical fusion with interbody prosthesis plating and  bonegraft  . Colonoscopy with propofol N/A 12/03/2014    Procedure: COLONOSCOPY WITH PROPOFOL;  Surgeon: [name], MD;  [facility]: [facility];  Service: Endoscopy;  Laterality: N/A;    There were no vitals filed for this visit.  Visit Diagnosis:  Bradykinesia  Rigidity  Essential tremor  Tremor  Decreased coordination      Subjective Assessment - 04/22/15 1538    Subjective  Pt reports that she got last of series of 3 shots for her L knee.  Pt reports difficulty threading needle  Pertinent History benign essential tremor mostly in RUE (and voice) for >41yr; cervical decompression 10/2014   Patient Stated Goals improve use of hands, writing, using utensils, care for herself/husband (who has medical issues)   Currently in Pain? No/denies                      OT Treatments/Exercises (OP) - 04/22/15 0001    ADLs   Eating Practiced grasp/release/functional reaching of various size/weight cups/mugs with focus on large amplitude and deliberate, controlled movements.  Pt did better with focus on slow movements/PWR! hands with incr ease with light weight cup.  Recommended pt consider wt. of objects at home.  Reviewed strategies for eating.  Pt reports improvement.   UB Dressing Practiced buttoning/unbuttoning with use of PWR! hands with  improvement noted.  LTG met--see goal section.   Writing Practiced writing name and copying 5 sentences using felt pen with good legibility and no decr in letter size but mild decr control/tremors noted in 4-5th sentences.  Pt instructed in use of PWR! hands if hands begin to get tight.  pt verbalizedd understanding.                OT Education - 04/22/15 1659    Education Details PD specific community resources; General PD info (issued POP presentation notes)   Person(s) Educated Patient   Methods Explanation;Verbal cues;Handout   Comprehension Verbalized understanding          OT Short Term Goals - 04/22/15 1649    OT SHORT TERM GOAL #1   Title Pt will be independent with HEP--check STGs 04/23/15   Time 4   Period Weeks   Status New   OT SHORT TERM GOAL #2   Title Pt will verbalize understanding of ways to prevent future complications and appropriate community resources.   Time 4   Period Weeks   Status Achieved  04/22/15           OT Long Term Goals - 04/22/15 1552    OT LONG TERM GOAL #1   Title Pt will verbalize understanding of strategies/AE to incr ease with ADLs (including eating, writing, buttoning).--check LTGs 05/09/15   Time 6   Period Weeks   Status New   OT LONG TERM GOAL #2   Title Pt will improve coordination for ADLs (fastening jewerly) as shown by improving time on 9-hole peg test by at least 5sec with R hand.   Baseline 39.56sec   Time 6   Period Weeks   Status New   OT LONG TERM GOAL #3   Title Pt will improve coordination/functional reaching for ADLs as shown by improving score on box and blocks by at least 4 bilaterally.   Baseline 39 blocks bilaterally   Time 6   Period Weeks   Status New   OT LONG TERM GOAL #4   Title Pt will button/unbutton 3 buttons on tabletop in 36sec or less.   Baseline 41.31sec   Time 6   Period Weeks   Status Achieved  04/22/15:  29.62sec   OT LONG TERM GOAL #5   Title Pt will improve time on PPT#2  (simulated eating) to 13sec or less using AE/strategies prn.   Baseline 16.28sec   Time 6   Period Weeks   Status New               Plan - 04/22/15 1651    Clinical Impression Statement Pt is progressing towards goals and demo improved  writing and buttoning with strategies.   Plan practice/explore strategies for theading needle if pt brings in; strategies for ADLs; discuss continuing vs. d/c next week (add appts if pt wants to continue).   Consulted and Agree with Plan of Care Patient        Problem List Patient Active Problem List   Diagnosis Date Noted  . Cervical spondylosis with myelopathy and radiculopathy 11/14/2013  . Essential tremor 06/13/2013  . Cervical spondylosis without myelopathy 06/13/2013  . Breast cancer of lower-outer quadrant of left female breast ([place]) 09/10/2010    [facility] 04/22/2015, 5:03 PM  [facility] [address] [place], [place], [address] Phone: [phone]   Fax:  [phone]  Name: April Church MRN: [id number] Date of Birth: 1940/11/20  Vianne Bulls, OTR/L [facility] [address] [place], [place]  [address] [phone] phone [phone] 04/22/2015 5:03 PM

## 2015-04-25 ENCOUNTER — Ambulatory Visit: Payer: Medicare Other | Attending: Diagnostic Neuroimaging | Admitting: Occupational Therapy

## 2015-04-25 DIAGNOSIS — G250 Essential tremor: Secondary | ICD-10-CM | POA: Diagnosis not present

## 2015-04-25 DIAGNOSIS — R258 Other abnormal involuntary movements: Secondary | ICD-10-CM | POA: Diagnosis not present

## 2015-04-25 DIAGNOSIS — R251 Tremor, unspecified: Secondary | ICD-10-CM | POA: Insufficient documentation

## 2015-04-25 DIAGNOSIS — R29898 Other symptoms and signs involving the musculoskeletal system: Secondary | ICD-10-CM | POA: Insufficient documentation

## 2015-04-25 DIAGNOSIS — R279 Unspecified lack of coordination: Secondary | ICD-10-CM | POA: Diagnosis not present

## 2015-04-25 NOTE — Patient Instructions (Signed)
Hold onto counter, bend left knee while extending right leg behind you until you feel a stretch in right leg 5-10 reps, hold 10 secs

## 2015-04-25 NOTE — Therapy (Signed)
[facility] [address] [place], [place], [address] Phone: [phone]   Fax:  [phone]  Occupational Therapy Treatment  Patient Details  Name: April Church MRN: [id number] Date of Birth: [birth year] Referring Provider: Dr. Andrey Spearman  Encounter Date: 04/25/2015      OT End of Session - 04/25/15 1409    Visit Number 7   Number of Visits 13   Date for OT Re-Evaluation 05/09/15   Authorization Type Medicare primary, BCBS secondary (g-code needed)   Authorization Time Period 03/25/15-05/23/15   Authorization - Visit Number 7   Authorization - Number of Visits 10   OT Start Time 1405   OT Stop Time 1445   OT Time Calculation (min) 40 min   Activity Tolerance Patient tolerated treatment well   Behavior During Therapy [facility] for tasks assessed/performed      Past Medical History  Diagnosis Date  . Tremors of nervous system     hands  . GERD (gastroesophageal reflux disease)   . 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 Cyst of finger 11/2011    annular cyst right long finger  . Hypertension     under control, has been on med. x 2 yrs.  . Dental crowns present   . Asthma     daily inhaler  . Hyperlipidemia   . PONV (postoperative nausea and vomiting)   . Frequency of urination   . Seasonal allergies   . Dermatitis   . Breast cancer ([place])     left- radiation and surgery -dx. 2011- no further tx. now- [name] , [name]  . Hemorrhoid     Past Surgical History  Procedure Laterality Date  . Nasal sinus surgery      x 2  . Foot surgery  06/22/11    left  . Knee arthroscopy  03/12/2005    right  . Nephrectomy living donor Left 04/2008    donated to spouse 2010([facility])  . Appendectomy  age 88  . Tumor excision  age 75    right arm  . Cataract extraction, bilateral    . Breast surgery  1999    reduction  . Breast lumpectomy  06/16/2009    left; SLN bx.  . Breast lumpectomy   07/01/2009    re-excision  . Knee arthroscopy      left  . Trigger finger release  12/16/2011    Procedure: RELEASE TRIGGER FINGER/A-1 PULLEY;  Surgeon: Tennis Must, MD;  Location: [facility];  Service: Orthopedics;  Laterality: Right;  RIGHT LONG FINGER TRIGGER RELEASE & ANNULAR CYST EXCISION  . Anterior cervical decompression/discectomy fusion 4 levels N/A 11/14/2013    Procedure: ANTERIOR CERVICAL DECOMPRESSION/DISCECTOMY FUSION 4 LEVELS;  Surgeon: Newman Pies, MD;  Location: [facility];  Service: Neurosurgery;  Laterality: N/A;  C34 C45 C56 C67 anterior cervical fusion with interbody prosthesis plating and  bonegraft  . Colonoscopy with propofol N/A 12/03/2014    Procedure: COLONOSCOPY WITH PROPOFOL;  Surgeon: [name], MD;  [facility];  Service: Endoscopy;  Laterality: N/A;    There were no vitals filed for this visit.  Visit Diagnosis:  Bradykinesia  Rigidity  Essential tremor  Tremor      Subjective Assessment - 04/25/15 1406    Pertinent History benign essential tremor mostly in RUE (and voice) for >3yrs; cervical decompression 10/2014   Currently in Pain? No/denies  Education provided regarding donning/ doffing jacket with adapted technique, with improved performance following practice donning (like a cape). Pt was educated in runner's stretch due to c/o of cramps at night. Pt returned demonstration.             PWR [facility]) - 04/25/15 1508    PWR! exercises Moves in [place];Moves in sitting   PWR! Up 20   PWR! Rock 20   PWR! Twist 20   Comments modified quadraped  min v.c. for positioning   PWR! Up 20   PWR! Rock 20   PWR! Twist 20               OT Short Term Goals - 04/22/15 1649    OT SHORT TERM GOAL #1   Title Pt will be independent with HEP--check STGs 04/23/15   Time 4   Period Weeks   Status New   OT SHORT TERM GOAL #2   Title Pt will verbalize understanding of ways to prevent  future complications and appropriate community resources.   Time 4   Period Weeks   Status Achieved  04/22/15           OT Long Term Goals - 04/22/15 1552    OT LONG TERM GOAL #1   Title Pt will verbalize understanding of strategies/AE to incr ease with ADLs (including eating, writing, buttoning).--check LTGs 05/09/15   Time 6   Period Weeks   Status New   OT LONG TERM GOAL #2   Title Pt will improve coordination for ADLs (fastening jewerly) as shown by improving time on 9-hole peg test by at least 5sec with R hand.   Baseline 39.56sec   Time 6   Period Weeks   Status New   OT LONG TERM GOAL #3   Title Pt will improve coordination/functional reaching for ADLs as shown by improving score on box and blocks by at least 4 bilaterally.   Baseline 39 blocks bilaterally   Time 6   Period Weeks   Status New   OT LONG TERM GOAL #4   Title Pt will button/unbutton 3 buttons on tabletop in 36sec or less.   Baseline 41.31sec   Time 6   Period Weeks   Status Achieved  04/22/15:  29.62sec   OT LONG TERM GOAL #5   Title Pt will improve time on PPT#2 (simulated eating) to 13sec or less using AE/strategies prn.   Baseline 16.28sec   Time 6   Period Weeks   Status New               Plan - 04/25/15 1456    Clinical Impression Statement Pt is progressing towards goals . She demonstates good understanding of PWR! exercises. Pt can benefit from reinforcement of large amplitude strategies with ADLs.   Pt will benefit from skilled therapeutic intervention in order to improve on the following deficits (Retired) Impaired UE functional use;Decreased knowledge of use of DME;Decreased balance;Impaired tone;Decreased coordination   Rehab Potential Good   OT Frequency 2x / week   OT Duration 6 weeks   OT Treatment/Interventions Moist Heat;Visual/perceptual remediation/compensation;Cognitive remediation/compensation;Fluidtherapy;Electrical Stimulation;Parrafin;DME and/or AE  instruction;Cryotherapy;Therapeutic activities;Passive range of motion;Contrast Bath;Therapeutic exercises;Energy conservation;Splinting;Manual Therapy;Neuromuscular education;Ultrasound;Self-care/ADL training;Therapeutic exercise;Functional Mobility Training;Patient/family education   Plan Pt to bring in a needle for practice, reinforce big movements with ADLs   OT Home Exercise Plan issued coordination HEP, PWR! prone, modified quadraped; 04/16/15 using big movement strategies for ADLs, initiated prevention of future complications, strategies for writing, eating buttoning  Consulted and Agree with Plan of Care Patient        Problem List Patient Active Problem List   Diagnosis Date Noted  . Cervical spondylosis with myelopathy and radiculopathy 11/14/2013  . Essential tremor 06/13/2013  . Cervical spondylosis without myelopathy 06/13/2013  . Breast cancer of lower-outer quadrant of left female breast ([place]) 09/10/2010  Theone Murdoch, OTR/L [phone] Phone: [phone] 3:14 PM 04/25/2015  [name],[name] 04/25/2015, 3:14 [facility]lth [place] [address] [place], [place], [address] Phone: [phone]   Fax:  [phone]  Name: LATAMARA RIPPER MRN: [id number] Date of Birth: [birth year]

## 2015-04-28 ENCOUNTER — Ambulatory Visit: Payer: Medicare Other | Admitting: Occupational Therapy

## 2015-04-28 DIAGNOSIS — R278 Other lack of coordination: Secondary | ICD-10-CM

## 2015-04-28 DIAGNOSIS — R251 Tremor, unspecified: Secondary | ICD-10-CM

## 2015-04-28 DIAGNOSIS — G250 Essential tremor: Secondary | ICD-10-CM

## 2015-04-28 DIAGNOSIS — R258 Other abnormal involuntary movements: Secondary | ICD-10-CM

## 2015-04-28 DIAGNOSIS — R29898 Other symptoms and signs involving the musculoskeletal system: Secondary | ICD-10-CM | POA: Diagnosis not present

## 2015-04-28 DIAGNOSIS — R279 Unspecified lack of coordination: Secondary | ICD-10-CM | POA: Diagnosis not present

## 2015-04-28 NOTE — Therapy (Signed)
[facility] [address] [place], [place], [address] Phone: [phone]   Fax:  [phone]  Occupational Therapy Treatment  Patient Details  Name: April Church MRN: [id number] Date of Birth: 1941-01-30 Referring Provider: Dr. Andrey Spearman  Encounter Date: 04/28/2015      OT End of Session - 04/28/15 1149    Visit Number 8   Number of Visits 13   Date for OT Re-Evaluation 05/09/15   Authorization Type Medicare primary, BCBS secondary (g-code needed)   Authorization Time Period 03/25/15-05/23/15   Authorization - Visit Number 8   Authorization - Number of Visits 10   OT Start Time 1148   OT Stop Time 1230   OT Time Calculation (min) 42 min   Activity Tolerance Patient tolerated treatment well   Behavior During Therapy [facility] for tasks assessed/performed      Past Medical History  Diagnosis Date  . Tremors of nervous system     hands  . GERD (gastroesophageal reflux disease)   . 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 Cyst of finger 11/2011    annular cyst right long finger  . Hypertension     under control, has been on med. x 2 yrs.  . Dental crowns present   . Asthma     daily inhaler  . Hyperlipidemia   . PONV (postoperative nausea and vomiting)   . Frequency of urination   . Seasonal allergies   . Dermatitis   . Breast cancer ([place])     left- radiation and surgery -dx. 2011- no further tx. now- [name] , [name]  . Hemorrhoid     Past Surgical History  Procedure Laterality Date  . Nasal sinus surgery      x 2  . Foot surgery  06/22/11    left  . Knee arthroscopy  03/12/2005    right  . Nephrectomy living donor Left 04/2008    donated to spouse 2010([facility])  . Appendectomy  age 45  . Tumor excision  age 50    right arm  . Cataract extraction, bilateral    . Breast surgery  1999    reduction  . Breast lumpectomy  06/16/2009    left; SLN bx.  . Breast lumpectomy   07/01/2009    re-excision  . Knee arthroscopy      left  . Trigger finger release  12/16/2011    Procedure: RELEASE TRIGGER FINGER/A-1 PULLEY;  Surgeon: Tennis Must, MD;  Location: [facility];  Service: Orthopedics;  Laterality: Right;  RIGHT LONG FINGER TRIGGER RELEASE & ANNULAR CYST EXCISION  . Anterior cervical decompression/discectomy fusion 4 levels N/A 11/14/2013    Procedure: ANTERIOR CERVICAL DECOMPRESSION/DISCECTOMY FUSION 4 LEVELS;  Surgeon: Newman Pies, MD;  Location: [facility];  Service: Neurosurgery;  Laterality: N/A;  C34 C45 C56 C67 anterior cervical fusion with interbody prosthesis plating and  bonegraft  . Colonoscopy with propofol N/A 12/03/2014    Procedure: COLONOSCOPY WITH PROPOFOL;  Surgeon: [name], MD;  [facility];  Service: Endoscopy;  Laterality: N/A;    There were no vitals filed for this visit.  Visit Diagnosis:  Bradykinesia  Rigidity  Essential tremor  Tremor  Decreased coordination                    OT Treatments/Exercises (OP) - 04/28/15 0001    ADLs   ADL Comments Attempted to thread needle  using needle threader in L hand and needle stuck in rolled towel.  Pt demo difficulty putting needle threader in hole (but forgot glasses).  Once threader in, pt able to thread needle. Pt to practice at home with glasses.  Discussed/recommended magnetic closures for jewelry.    Fine Motor Coordination   Fine Motor Coordination Grooved pegs   Grooved pegs Placing grooved pegs in pegboard with each hand with min difficulty for in-hand manipulation                OT Education - 04/28/15 1242    Education Details Reviewed coordination HEP   Person(s) Educated Patient   Methods Explanation;Demonstration;Verbal cues  min v.c./demo for larger amplitude movements   Comprehension Verbalized understanding;Returned demonstration          OT Short Term Goals - 04/28/15 1254    OT SHORT TERM GOAL #1    Title Pt will be independent with HEP--check STGs 04/23/15   Time 4   Period Weeks   Status Achieved  04/28/15   OT SHORT TERM GOAL #2   Title Pt will verbalize understanding of ways to prevent future complications and appropriate community resources.   Time 4   Period Weeks   Status Achieved  04/22/15           OT Long Term Goals - 04/22/15 1552    OT LONG TERM GOAL #1   Title Pt will verbalize understanding of strategies/AE to incr ease with ADLs (including eating, writing, buttoning).--check LTGs 05/09/15   Time 6   Period Weeks   Status New   OT LONG TERM GOAL #2   Title Pt will improve coordination for ADLs (fastening jewerly) as shown by improving time on 9-hole peg test by at least 5sec with R hand.   Baseline 39.56sec   Time 6   Period Weeks   Status New   OT LONG TERM GOAL #3   Title Pt will improve coordination/functional reaching for ADLs as shown by improving score on box and blocks by at least 4 bilaterally.   Baseline 39 blocks bilaterally   Time 6   Period Weeks   Status New   OT LONG TERM GOAL #4   Title Pt will button/unbutton 3 buttons on tabletop in 36sec or less.   Baseline 41.31sec   Time 6   Period Weeks   Status Achieved  04/22/15:  29.62sec   OT LONG TERM GOAL #5   Title Pt will improve time on PPT#2 (simulated eating) to 13sec or less using AE/strategies prn.   Baseline 16.28sec   Time 6   Period Weeks   Status New               Plan - 04/28/15 1253    Clinical Impression Statement Pt progressing towards goalswith good performance of coordination HEP, but would benefit from additional reinforcement for strategies for ADLs and big amplitude movement.   Plan big amplitude movements for ADLs/strategies for ADLs, functional reach   OT Home Exercise Plan issued coordination HEP, PWR! prone, modified quadraped; 04/16/15 using big movement strategies for ADLs, initiated prevention of future complications, strategies for writing, eating  buttoning   Consulted and Agree with Plan of Care Patient        Problem List Patient Active Problem List   Diagnosis Date Noted  . Cervical spondylosis with myelopathy and radiculopathy 11/14/2013  . Essential tremor 06/13/2013  . Cervical spondylosis without myelopathy 06/13/2013  . Breast cancer of lower-outer quadrant  of left female breast [facility]) 09/10/2010    [facility] 04/28/2015, 12:57 PM  [facility] [address] [place], [place], [address] Phone: [phone]   Fax:  [phone]  Name: MARLAYNE RUTTEN MRN: [id number] Date of Birth: 12-22-1940  Vianne Bulls, OTR/L [facility] [address] [place], [place]  [address] [phone] phone [phone] 04/28/2015 12:58 PM

## 2015-04-30 DIAGNOSIS — E785 Hyperlipidemia, unspecified: Secondary | ICD-10-CM | POA: Diagnosis not present

## 2015-04-30 DIAGNOSIS — G20 Parkinson's disease: Secondary | ICD-10-CM | POA: Diagnosis not present

## 2015-04-30 DIAGNOSIS — I10 Essential (primary) hypertension: Secondary | ICD-10-CM | POA: Diagnosis not present

## 2015-04-30 DIAGNOSIS — Z1389 Encounter for screening for other disorder: Secondary | ICD-10-CM | POA: Diagnosis not present

## 2015-04-30 DIAGNOSIS — J4530 Mild persistent asthma, uncomplicated: Secondary | ICD-10-CM | POA: Diagnosis not present

## 2015-04-30 DIAGNOSIS — G250 Essential tremor: Secondary | ICD-10-CM | POA: Diagnosis not present

## 2015-04-30 DIAGNOSIS — Z0000 Encounter for general adult medical examination without abnormal findings: Secondary | ICD-10-CM | POA: Diagnosis not present

## 2015-05-01 ENCOUNTER — Ambulatory Visit: Payer: Medicare Other | Admitting: Occupational Therapy

## 2015-05-01 DIAGNOSIS — R279 Unspecified lack of coordination: Secondary | ICD-10-CM | POA: Diagnosis not present

## 2015-05-01 DIAGNOSIS — R251 Tremor, unspecified: Secondary | ICD-10-CM | POA: Diagnosis not present

## 2015-05-01 DIAGNOSIS — G250 Essential tremor: Secondary | ICD-10-CM | POA: Diagnosis not present

## 2015-05-01 DIAGNOSIS — R258 Other abnormal involuntary movements: Secondary | ICD-10-CM | POA: Diagnosis not present

## 2015-05-01 DIAGNOSIS — R29898 Other symptoms and signs involving the musculoskeletal system: Secondary | ICD-10-CM

## 2015-05-01 DIAGNOSIS — R278 Other lack of coordination: Secondary | ICD-10-CM

## 2015-05-01 NOTE — Therapy (Signed)
[facility] [address] [place], [place], [address] Phone: [phone]   Fax:  [phone]  Occupational Therapy Treatment  Patient Details  Name: April Church MRN: [id number] Date of Birth: 08-17-1940 Referring Provider: Dr. Andrey Spearman  Encounter Date: 05/01/2015      OT End of Session - 05/01/15 1318    Visit Number 9   Number of Visits 13   Date for OT Re-Evaluation 05/09/15   Authorization Type Medicare primary, BCBS secondary (g-code needed)   Authorization Time Period 03/25/15-05/23/15   Authorization - Visit Number 9   Authorization - Number of Visits 10   OT Start Time 1319   OT Stop Time 1405   OT Time Calculation (min) 46 min   Activity Tolerance Patient tolerated treatment well   Behavior During Therapy [facility] for tasks assessed/performed      Past Medical History  Diagnosis Date  . Tremors of nervous system     hands  . GERD (gastroesophageal reflux disease)   . 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 Cyst of finger 11/2011    annular cyst right long finger  . Hypertension     under control, has been on med. x 2 yrs.  . Dental crowns present   . Asthma     daily inhaler  . Hyperlipidemia   . PONV (postoperative nausea and vomiting)   . Frequency of urination   . Seasonal allergies   . Dermatitis   . Breast cancer ([place])     left- radiation and surgery -dx. 2011- no further tx. now- [name] , [name]  . Hemorrhoid     Past Surgical History  Procedure Laterality Date  . Nasal sinus surgery      x 2  . Foot surgery  06/22/11    left  . Knee arthroscopy  03/12/2005    right  . Nephrectomy living donor Left 04/2008    donated to spouse 2010([facility])  . Appendectomy  age 6  . Tumor excision  age 86    right arm  . Cataract extraction, bilateral    . Breast surgery  1999    reduction  . Breast lumpectomy  06/16/2009    left; SLN bx.  . Breast lumpectomy   07/01/2009    re-excision  . Knee arthroscopy      left  . Trigger finger release  12/16/2011    Procedure: RELEASE TRIGGER FINGER/A-1 PULLEY;  Surgeon: Tennis Must, MD;  Location: [place] [facility];  Service: Orthopedics;  Laterality: Right;  RIGHT LONG FINGER TRIGGER RELEASE & ANNULAR CYST EXCISION  . Anterior cervical decompression/discectomy fusion 4 levels N/A 11/14/2013    Procedure: ANTERIOR CERVICAL DECOMPRESSION/DISCECTOMY FUSION 4 LEVELS;  Surgeon: Newman Pies, MD;  Location: [place] [facility];  Service: Neurosurgery;  Laterality: N/A;  C34 C45 C56 C67 anterior cervical fusion with interbody prosthesis plating and  bonegraft  . Colonoscopy with propofol N/A 12/03/2014    Procedure: COLONOSCOPY WITH PROPOFOL;  Surgeon: [name], MD;  [facility];  Service: Endoscopy;  Laterality: N/A;    There were no vitals filed for this visit.  Visit Diagnosis:  Bradykinesia  Rigidity  Tremor  Essential tremor  Decreased coordination      Subjective Assessment - 05/01/15 1321    Subjective  Pt reports that she was able to thread needle using strategies we discussed/tried last session (and glasses)   Pertinent History benign  essential tremor mostly in RUE (and voice) for >70yr; cervical decompression 10/2014   Patient Stated Goals improve use of hands, writing, using utensils, care for herself/husband (who has medical issues)   Currently in Pain? No/denies            [facility] - [facility]    Coordination   Right 9 Hole Peg Test 31.82   Left 9 Hole Peg Test 27.94            Checked goals and discussed progress.--see goals section below.      Placing/removing grooved pegs in pegboard for coordination with min difficulty each hand.  Reviewed use of big amplitude movements for ADLs.            OT Education - 05/01/15 1722    Education Details Reviewed community resources; importance of continuing to perform HEP;  appropriate community exercise/benefits of aerobic exercise and safe ways to perform; recommendation for OT, PT, ST screens in approx 6 months    Person(s) Educated Patient   Methods Explanation   Comprehension Verbalized understanding          OT Short Term Goals - 04/28/15 1254    OT SHORT TERM GOAL #1   Title Pt will be independent with HEP--check STGs 04/23/15   Time 4   Period Weeks   Status Achieved  04/28/15   OT SHORT TERM GOAL #2   Title Pt will verbalize understanding of ways to prevent future complications and appropriate community resources.   Time 4   Period Weeks   Status Achieved  04/22/15           OT Long Term Goals - 05/01/15 1322    OT LONG TERM GOAL #1   Title Pt will verbalize understanding of strategies/AE to incr ease with ADLs (including eating, writing, buttoning).--check LTGs 05/09/15   Time 6   Period Weeks   Status Achieved  05/01/15   OT LONG TERM GOAL #2   Title Pt will improve coordination for ADLs (fastening jewerly) as shown by improving time on 9-hole peg test by at least 5sec with R hand.   Baseline 39.56sec   Time 6   Period Weeks   Status Achieved  31.82sec   OT LONG TERM GOAL #3   Title Pt will improve coordination/functional reaching for ADLs as shown by improving score on box and blocks by at least 4 bilaterally.   Baseline 39 blocks bilaterally   Time 6   Period Weeks   Status Achieved  05/01/15:  R- 46 blocks, L-44 blocks    OT LONG TERM GOAL #4   Title Pt will button/unbutton 3 buttons on tabletop in 36sec or less.   Baseline 41.31sec   Time 6   Period Weeks   Status Achieved  04/22/15:  29.62sec   OT LONG TERM GOAL #5   Title Pt will improve time on PPT#2 (simulated eating) to 13sec or less using AE/strategies prn.   Baseline 16.28sec   Time 6   Period Weeks   Status Achieved  05/01/15:  9.58sec               Plan - 05/01/15 1742    Clinical Impression Statement Pt has made excellent progress and has met all  goals.   Plan d/c OT, schedule screen in approx 6 months   OT Home Exercise Plan issued coordination HEP, PWR! prone, modified quadraped; 04/16/15 using big movement strategies for ADLs, initiated prevention of future complications, strategies  for writing, eating buttoning   Consulted and Agree with Plan of Care Patient          G-Codes - May 15, 2015 1740    Functional Assessment Tool Used 9-hole peg test:  R 31.81sec; box and blocks test: R-46 blocks, L-44 blocks; improved ability to open packages/containers, manipulating utensils, and writing   Functional Limitation Carrying, moving and handling objects   Carrying, Moving and Handling Objects Goal Status ([id number]) At least 1 percent but less than 20 percent impaired, limited or restricted   Carrying, Moving and Handling Objects Discharge Status [phone]) At least 1 percent but less than 20 percent impaired, limited or restricted      OCCUPATIONAL THERAPY DISCHARGE SUMMARY  Visits from Start of Care: 9  Current functional level related to goals / functional outcomes: See above   Remaining deficits: See above   Education / Equipment: Pt instructed in ways to prevent future complications, compensation/adaptive strategies and AE for incr ease with ADLs, general PD education, appropriate PD-related community resources, and PD-specific HEP.  Pt verbalized understanding of all education provided.  Plan: Patient agrees to discharge.  Patient goals were met. Patient is being discharged due to meeting the stated rehab goals.  ?????Pt would benefit from re-evaluation/occupational therapy screen in approx 6 months to assess for need for further therapy/functional changes due to progressive nature of diagnosis.           Problem List Patient Active Problem List   Diagnosis Date Noted  . Cervical spondylosis with myelopathy and radiculopathy 11/14/2013  . Essential tremor 06/13/2013  . Cervical spondylosis without myelopathy 06/13/2013  .  Breast cancer of lower-outer quadrant of left female breast [facility]) 09/10/2010    [facility] 05/15/2015, 5:45 PM  [facility] [address] [place], [place], [address] Phone: [phone]   Fax:  [phone]  Name: April Church MRN: [id number] Date of Birth: 21-Dec-1940  Vianne Bulls, OTR/L [facility] [address] [place], [place]  [address] [phone] phone [phone] 05-15-15 5:45 PM

## 2015-05-02 DIAGNOSIS — J385 Laryngeal spasm: Secondary | ICD-10-CM | POA: Diagnosis not present

## 2015-05-05 ENCOUNTER — Encounter: Payer: Medicare Other | Admitting: Occupational Therapy

## 2015-05-09 ENCOUNTER — Ambulatory Visit: Payer: Medicare Other | Admitting: Occupational Therapy

## 2015-05-13 ENCOUNTER — Encounter: Payer: Self-pay | Admitting: Diagnostic Neuroimaging

## 2015-05-13 ENCOUNTER — Ambulatory Visit (INDEPENDENT_AMBULATORY_CARE_PROVIDER_SITE_OTHER): Payer: Medicare Other | Admitting: Diagnostic Neuroimaging

## 2015-05-13 VITALS — BP 128/80 | HR 91 | Ht 61.0 in | Wt 131.6 lb

## 2015-05-13 DIAGNOSIS — R498 Other voice and resonance disorders: Secondary | ICD-10-CM | POA: Diagnosis not present

## 2015-05-13 DIAGNOSIS — G250 Essential tremor: Secondary | ICD-10-CM

## 2015-05-13 DIAGNOSIS — G20 Parkinson's disease: Secondary | ICD-10-CM

## 2015-05-13 DIAGNOSIS — M4712 Other spondylosis with myelopathy, cervical region: Secondary | ICD-10-CM

## 2015-05-13 DIAGNOSIS — M4722 Other spondylosis with radiculopathy, cervical region: Secondary | ICD-10-CM

## 2015-05-13 MED ORDER — PRIMIDONE 50 MG PO TABS: 100.0000 mg | ORAL_TABLET | Freq: Three times a day (TID) | ORAL | Status: DC

## 2015-05-13 MED ORDER — CARBIDOPA-LEVODOPA 25-100 MG PO TABS: 1.0000 | ORAL_TABLET | Freq: Three times a day (TID) | ORAL | Status: DC

## 2015-05-13 NOTE — Progress Notes (Signed)
[facility]  PATIENT: April Church DOB: October 26, 1940  REFERRING CLINICIAN:  HISTORY FROM: patient REASON FOR VISIT: follow up   Chief Complaint  Patient presents with  . Tremors    rm 7, "Botox at [facility] on 05/02/15; got to where I couldn't talk but voice coming back"  . Follow-up    3 month    HISTORICAL  CHIEF COMPLAINT:  Chief Complaint  Patient presents with  . Tremors    rm 7, "Botox at [facility] on 05/02/15; got to where I couldn't talk but voice coming back"  . Follow-up    3 month    HISTORY OF PRESENT ILLNESS:   UPDATE 05/13/15: Since last visit, tremor in arms better. Had ST and botox for voice issues. Some wearing off of meds before mid-day dosing. No neck pain issues.   UPDATE 02/12/15: Since last visit, tremor is stable. More voice strangulation issues. Tolerating primidone 100mg  TID.  UPDATE 08/13/14: Since last visit, tried primidone 250mg  BID, but not much tremor benefit; also having more sleepiness in the day time. Still with hand > voice > chin tremor. No falls. Balance getting slightly worse.  UPDATE 05/22/14: Since last visit, tremor progressing. Now notes new resting tremor in the last year. Some balance and walking issues. She is s/p cervical decompression in Sept 2015, with good results.   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 Her tremor does not limit any of her activities of daily life. She denies any side effects of the Mysoline. She also reports today that she's had some numbness and tingling down her right arm from her neck and shoulder. It has been bothering her more in the last 6 months. 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 She saw Dr. Arnoldo Morale back in 2004. She returns for reevaluation  UPDATE 06/07/12 (VRP): Doing well. BUE tremor is stable and controlled. Voice tremor is slightly  progressing. Tolerating primidone.  PRIOR HPI (06/09/11, VRP):  75 yrs old female returns for F/U. Last seen 06/09/10.  She has been followed in this office for many years for essential tremor. She takes primidone 50 mg, 1.5 TID. She tolerates it well. She comes in today saying that her tremor is a little worse before lunch.  The tremor limits her a little bit with eating and writing but for the most part she is happy with her level of function.She has hx of  breast cancer.  No neurologic complaints.     REVIEW OF SYSTEMS: Full 14 system review of systems performed and negative except for speech diff tremors.    ALLERGIES: Allergies  Allergen Reactions  . Shellfish Allergy Shortness Of Breath  . Duloxetine Itching and Rash  . Sulfa Drugs Cross Reactors Anxiety and Rash  . Codeine Anxiety    HOME MEDICATIONS: Outpatient Prescriptions Prior to Visit  Medication Sig Dispense Refill  . albuterol (PROVENTIL HFA;VENTOLIN HFA) 108 (90 BASE) MCG/ACT inhaler Inhale 1 puff into the lungs every 6 (six) hours as needed for wheezing or shortness of breath.    [name] amLODipine (NORVASC) 5 MG tablet Take 1 tablet (5 mg total) by mouth 2 (two) times daily.    [name] aspirin 81 MG tablet Take 81 mg by mouth daily.      . beclomethasone (QVAR) 80 MCG/ACT inhaler Inhale 1 puff into the lungs 2 (two) times daily.     [name]  CALCIUM CARBONATE PO Take 1 tablet by mouth 2 (two) times daily.    . carbidopa-levodopa (SINEMET IR) 25-100 MG tablet Take 1 tablet by mouth 3 (three) times daily before meals. 90 tablet 6  . Cholecalciferol (VITAMIN D) 1000 UNITS capsule Take 1,000 Units by mouth 2 (two) times daily.     . fexofenadine (ALLEGRA) 180 MG tablet Take 180 mg by mouth daily.    . fluticasone (FLONASE) 50 MCG/ACT nasal spray Place 1 spray into the nose 2 (two) times daily.     [name] ipratropium (ATROVENT) 0.03 % nasal spray Place 2 sprays into both nostrils 2 (two) times daily.    . Omega-3 Fatty Acids (FISH OIL) 1200 MG CAPS  Take 1,200 mg by mouth 2 (two) times daily.     Vladimir Faster Glycol-Propyl Glycol (SYSTANE OP) Place 1 drop into both eyes 2 (two) times daily.    . primidone (MYSOLINE) 50 MG tablet Take 2 tablets (100 mg total) by mouth 3 (three) times daily. 540 tablet 4  . ranitidine (ZANTAC) 150 MG capsule Take 150 mg by mouth daily after breakfast.     . triamcinolone cream (KENALOG) 0.1 % Apply 1 application topically 2 (two) times daily as needed (FOR RASH).     [name] venlafaxine (EFFEXOR) 37.5 MG tablet Take 1 tablet (37.5 mg total) by mouth daily. 90 tablet 3   No facility-administered medications prior to visit.    PAST MEDICAL HISTORY: Past Medical History  Diagnosis Date  . Tremors of nervous system     hands  . GERD (gastroesophageal reflux disease)   . 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 Cyst of finger 11/2011    annular cyst right long finger  . Hypertension     under control, has been on med. x 2 yrs.  . Dental crowns present   . Asthma     daily inhaler  . Hyperlipidemia   . PONV (postoperative nausea and vomiting)   . Frequency of urination   . Seasonal allergies   . Dermatitis   . Breast cancer ([place])     left- radiation and surgery -dx. 2011- no further tx. now- [name] , [name]  . Hemorrhoid     PAST SURGICAL HISTORY: Past Surgical History  Procedure Laterality Date  . Nasal sinus surgery      x 2  . Foot surgery  06/22/11    left  . Knee arthroscopy  03/12/2005    right  . Nephrectomy living donor Left 04/2008    donated to spouse 2010([facility])  . Appendectomy  age 75  . Tumor excision  age 75    right arm  . Cataract extraction, bilateral    . Breast surgery  1999    reduction  . Breast lumpectomy  06/16/2009    left; SLN bx.  . Breast lumpectomy  07/01/2009    re-excision  . Knee arthroscopy      left  . Trigger finger release  12/16/2011    Procedure: RELEASE TRIGGER FINGER/A-1 PULLEY;  Surgeon: Tennis Must, MD;  Location: [facility];  Service: Orthopedics;  Laterality: Right;  RIGHT LONG FINGER TRIGGER RELEASE & ANNULAR CYST EXCISION  . Anterior cervical decompression/discectomy fusion 4 levels N/A 11/14/2013    Procedure: ANTERIOR CERVICAL DECOMPRESSION/DISCECTOMY FUSION 4 LEVELS;  Surgeon: Newman Pies, MD;  Location: [facility];  Service: Neurosurgery;  Laterality: N/A;  C34 C45 C56 C67 anterior cervical fusion  with interbody prosthesis plating and  bonegraft  . Colonoscopy with propofol N/A 12/03/2014    Procedure: COLONOSCOPY WITH PROPOFOL;  Surgeon: [name], MD;  [facility];  Service: Endoscopy;  Laterality: N/A;    FAMILY HISTORY: Family History  Problem Relation Age of Onset  . Kidney disease Mother   . Stroke Father   . Diabetes Brother   . Heart disease Brother   . Cancer Sister     SOCIAL HISTORY:  Social History   Social History  . Marital Status: Married    Spouse Name: Marcello Moores   . Number of Children: 1  . Years of Education: HS   Occupational History  . Retired    Social History Main Topics  . Smoking status: Never Smoker   . Smokeless tobacco: Never Used  . Alcohol Use: No  . Drug Use: No  . Sexual Activity: Yes   Other Topics Concern  . Not on file   Social History Narrative      Pt lives at home with her spouse.  [name]    Patient has 1 child.    Patient is retired.    Patient has a HS education.    Patient is right handed.    Patient drinks caffeine occasionally.               PHYSICAL EXAM  Filed Vitals:   05/13/15 1333  BP: 128/80  Pulse: 91  Height: 5\' 1"  (1.549 m)  Weight: 131 lb 9.6 oz (59.693 kg)   Body mass index is 24.88 kg/(m^2).  GENERAL EXAM: Patient is in no distress  CARDIOVASCULAR: Regular rate and rhythm, no murmurs, no carotid bruits  NEUROLOGIC: MENTAL STATUS: awake, alert, language fluent, comprehension intact, naming intact; MASKED FACIES; NEG FRONTAL RELEASE SIGNS CRANIAL NERVE: pupils equal and  reactive to light, visual fields full to confrontation, extraocular muscles intact, no nystagmus, facial sensation and strength symmetric, uvula midline, shoulder shrug symmetric, tongue midline; SOFT HOARSE VOICE MOTOR: normal bulk, POSTURAL AND ACTION TREMOR; INT REST TREMOR IN LUE WITH CONTRALATERAL RAM; BRADYKINESIA IN BUE and BLE; NO RIGIDITY; full strength in the BUE, BLE; INTERMITTENT MOUTH TREMOR SENSORY: normal and symmetric to light touch COORDINATION: finger-nose-finger, fine finger movements normal REFLEXES: deep tendon reflexes present and symmetric GAIT/STATION: narrow based gait; SLOW AND CAUTIOUS, DECR ARM SWING    DIAGNOSTIC DATA (LABS, IMAGING, TESTING) - I reviewed patient records, labs, notes, testing and imaging myself where available.  Lab Results  Component Value Date   WBC 5.2 01/20/2015   HGB 12.0 01/20/2015   HCT 36.1 01/20/2015   MCV 88.4 01/20/2015   PLT 293 01/20/2015      Component Value Date/Time   NA 136 01/20/2015 1326   NA 134* 11/14/2013 1309   K 4.1 01/20/2015 1326   K 4.1 11/14/2013 1309   CL 100 11/06/2013 1032   CL 102 06/30/2012 1231   CO2 27 01/20/2015 1326   CO2 27 11/06/2013 1032   GLUCOSE 66* 01/20/2015 1326   GLUCOSE 176* 11/14/2013 1309   GLUCOSE 77 06/30/2012 1231   BUN 13.9 01/20/2015 1326   BUN 13 11/06/2013 1032   CREATININE 0.9 01/20/2015 1326   CREATININE 0.73 11/06/2013 1032   CALCIUM 8.8 01/20/2015 1326   CALCIUM 9.6 11/06/2013 1032   PROT 7.4 01/20/2015 1326   PROT 6.7 05/21/2011 1423   ALBUMIN 3.6 01/20/2015 1326   ALBUMIN 4.0 05/21/2011 1423   AST 17 01/20/2015 1326   AST 18 05/21/2011  1423   ALT 13 01/20/2015 1326   ALT 10 05/21/2011 1423   ALKPHOS 86 01/20/2015 1326   ALKPHOS 44 05/21/2011 1423   BILITOT <0.30 01/20/2015 1326   BILITOT 0.2* 05/21/2011 1423   GFRNONAA 83* 11/06/2013 1032   GFRAA >90 11/06/2013 1032   No results found for: CHOL No results found for: HGBA1C No results found for:  VITAMINB12 No results found for: TSH   06/25/13 EMG/NCS 1. 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 2. No evidence of widespread underlying large fiber neuropathy.  11/14/13 CXR [I reviewed images myself and agree with interpretation. -VRP]  - Anterior cervical disc fusion localization images as described.    ASSESSMENT AND PLAN  75 y.o. year old female  has a past medical history of Tremors of nervous system; GERD (gastroesophageal reflux disease); Trigger finger of right hand (11/2011); Cyst of finger (11/2011); Hypertension; Dental crowns present; Asthma; Hyperlipidemia; PONV (postoperative nausea and vomiting); Frequency of urination; Seasonal allergies; Dermatitis; Breast cancer ([place]); and Hemorrhoid. here with essential tremor since 1990's. Now with intermittent resting tremor AND bradykinesia since 2016. May represent essential tremor with superimposed (parkinson's disease vs cervical myelopathy sequelae).   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)  Essential tremor  Parkinsonism, unspecified Parkinsonism type ([place])  Cervical spondylosis with myelopathy and radiculopathy  Voice tremor     PLAN: 1. Continue primidone 100mg  TID 2. Continue carb/levo 1 tab TID 3. Caution with walking and balance  4. 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 primidone (MYSOLINE) 50 MG tablet    Sig: Take 2 tablets (100 mg total) by mouth 3 (three) times daily.    Dispense:  540 tablet    Refill:  4  . carbidopa-levodopa (SINEMET IR) 25-100 MG tablet    Sig: Take 1 tablet by mouth 3 (three) times daily before meals.    Dispense:  90 tablet    Refill:  6   Return in about 6 months (around 11/13/2015).      Penni Bombard, MD [id number], [id number] PM Certified in Neurology, Neurophysiology and Neuroimaging  [facility] [address], [place], [place] [address] [phone]

## 2015-05-13 NOTE — Patient Instructions (Signed)
-  continue current medications

## 2015-05-19 DIAGNOSIS — H0279 Other degenerative disorders of eyelid and periocular area: Secondary | ICD-10-CM | POA: Diagnosis not present

## 2015-05-19 DIAGNOSIS — H53483 Generalized contraction of visual field, bilateral: Secondary | ICD-10-CM | POA: Diagnosis not present

## 2015-05-19 DIAGNOSIS — H02834 Dermatochalasis of left upper eyelid: Secondary | ICD-10-CM | POA: Diagnosis not present

## 2015-05-19 DIAGNOSIS — H02831 Dermatochalasis of right upper eyelid: Secondary | ICD-10-CM | POA: Diagnosis not present

## 2015-06-23 DIAGNOSIS — J385 Laryngeal spasm: Secondary | ICD-10-CM | POA: Diagnosis not present

## 2015-06-23 DIAGNOSIS — R490 Dysphonia: Secondary | ICD-10-CM | POA: Diagnosis not present

## 2015-06-30 DIAGNOSIS — H02831 Dermatochalasis of right upper eyelid: Secondary | ICD-10-CM | POA: Diagnosis not present

## 2015-06-30 DIAGNOSIS — H02834 Dermatochalasis of left upper eyelid: Secondary | ICD-10-CM | POA: Diagnosis not present

## 2015-06-30 DIAGNOSIS — H02413 Mechanical ptosis of bilateral eyelids: Secondary | ICD-10-CM | POA: Diagnosis not present

## 2015-07-30 DIAGNOSIS — Z853 Personal history of malignant neoplasm of breast: Secondary | ICD-10-CM | POA: Diagnosis not present

## 2015-07-30 DIAGNOSIS — Z7982 Long term (current) use of aspirin: Secondary | ICD-10-CM | POA: Diagnosis not present

## 2015-07-30 DIAGNOSIS — Z79899 Other long term (current) drug therapy: Secondary | ICD-10-CM | POA: Diagnosis not present

## 2015-07-30 DIAGNOSIS — G20 Parkinson's disease: Secondary | ICD-10-CM | POA: Diagnosis not present

## 2015-07-30 DIAGNOSIS — I10 Essential (primary) hypertension: Secondary | ICD-10-CM | POA: Diagnosis not present

## 2015-07-30 DIAGNOSIS — Z01818 Encounter for other preprocedural examination: Secondary | ICD-10-CM | POA: Diagnosis not present

## 2015-07-30 DIAGNOSIS — R05 Cough: Secondary | ICD-10-CM | POA: Diagnosis not present

## 2015-07-30 DIAGNOSIS — Z524 Kidney donor: Secondary | ICD-10-CM | POA: Diagnosis not present

## 2015-08-20 DIAGNOSIS — Z124 Encounter for screening for malignant neoplasm of cervix: Secondary | ICD-10-CM | POA: Diagnosis not present

## 2015-08-20 DIAGNOSIS — Z01419 Encounter for gynecological examination (general) (routine) without abnormal findings: Secondary | ICD-10-CM | POA: Diagnosis not present

## 2015-08-20 DIAGNOSIS — Z8041 Family history of malignant neoplasm of ovary: Secondary | ICD-10-CM | POA: Diagnosis not present

## 2015-08-20 DIAGNOSIS — Z77128 Contact with and (suspected) exposure to other hazards in the physical environment: Secondary | ICD-10-CM | POA: Diagnosis not present

## 2015-08-20 DIAGNOSIS — D259 Leiomyoma of uterus, unspecified: Secondary | ICD-10-CM | POA: Diagnosis not present

## 2015-10-20 DIAGNOSIS — L910 Hypertrophic scar: Secondary | ICD-10-CM | POA: Diagnosis not present

## 2015-10-20 DIAGNOSIS — H0279 Other degenerative disorders of eyelid and periocular area: Secondary | ICD-10-CM | POA: Diagnosis not present

## 2015-10-21 DIAGNOSIS — D1801 Hemangioma of skin and subcutaneous tissue: Secondary | ICD-10-CM | POA: Diagnosis not present

## 2015-10-21 DIAGNOSIS — L821 Other seborrheic keratosis: Secondary | ICD-10-CM | POA: Diagnosis not present

## 2015-10-30 ENCOUNTER — Encounter: Payer: Medicare Other | Admitting: Speech Pathology

## 2015-10-30 ENCOUNTER — Ambulatory Visit: Payer: Medicare Other | Admitting: Occupational Therapy

## 2015-10-30 ENCOUNTER — Ambulatory Visit: Payer: Medicare Other | Admitting: Physical Therapy

## 2015-11-06 ENCOUNTER — Ambulatory Visit: Payer: Medicare Other | Attending: Internal Medicine | Admitting: Physical Therapy

## 2015-11-06 ENCOUNTER — Ambulatory Visit: Payer: Medicare Other

## 2015-11-06 ENCOUNTER — Ambulatory Visit: Payer: Medicare Other | Admitting: Occupational Therapy

## 2015-11-06 DIAGNOSIS — C50512 Malignant neoplasm of lower-outer quadrant of left female breast: Secondary | ICD-10-CM | POA: Diagnosis not present

## 2015-11-06 DIAGNOSIS — F329 Major depressive disorder, single episode, unspecified: Secondary | ICD-10-CM | POA: Diagnosis not present

## 2015-11-06 DIAGNOSIS — J45909 Unspecified asthma, uncomplicated: Secondary | ICD-10-CM | POA: Diagnosis not present

## 2015-11-06 DIAGNOSIS — G20 Parkinson's disease: Secondary | ICD-10-CM | POA: Diagnosis not present

## 2015-11-06 DIAGNOSIS — R1310 Dysphagia, unspecified: Secondary | ICD-10-CM | POA: Insufficient documentation

## 2015-11-06 DIAGNOSIS — R471 Dysarthria and anarthria: Secondary | ICD-10-CM | POA: Insufficient documentation

## 2015-11-06 DIAGNOSIS — R293 Abnormal posture: Secondary | ICD-10-CM | POA: Insufficient documentation

## 2015-11-06 DIAGNOSIS — R490 Dysphonia: Secondary | ICD-10-CM | POA: Insufficient documentation

## 2015-11-06 DIAGNOSIS — E785 Hyperlipidemia, unspecified: Secondary | ICD-10-CM | POA: Diagnosis not present

## 2015-11-06 DIAGNOSIS — R2689 Other abnormalities of gait and mobility: Secondary | ICD-10-CM | POA: Insufficient documentation

## 2015-11-06 DIAGNOSIS — G250 Essential tremor: Secondary | ICD-10-CM | POA: Diagnosis not present

## 2015-11-06 DIAGNOSIS — C4491 Basal cell carcinoma of skin, unspecified: Secondary | ICD-10-CM | POA: Diagnosis not present

## 2015-11-06 DIAGNOSIS — I10 Essential (primary) hypertension: Secondary | ICD-10-CM | POA: Diagnosis not present

## 2015-11-06 NOTE — Therapy (Signed)
[facility] [address] [place], [place], [address] Phone: [phone]   Fax:  [phone]  Occupational Therapy Treatment  Patient Details  Name: April Church MRN: [id number] Date of Birth: 1941/02/02 Referring Provider: Dr. Andrey Spearman  Encounter Date: 11/06/2015    Past Medical History:  Diagnosis Date  . Asthma    daily inhaler  . Breast cancer ([place])    left- radiation and surgery -dx. 2011- no further tx. now- [name] , [name]  . Cyst of finger 11/2011   annular cyst right long finger  . Dental crowns present   . Dermatitis   . Frequency of urination   . GERD (gastroesophageal reflux disease)   . Hemorrhoid   . Hyperlipidemia   . Hypertension    under control, has been on med. x 2 yrs.  [name] PONV (postoperative nausea and vomiting)   . Seasonal allergies   . Tremors of nervous system    hands  . Trigger finger of right hand 11/2011   long finger    Past Surgical History:  Procedure Laterality Date  . ANTERIOR CERVICAL DECOMPRESSION/DISCECTOMY FUSION 4 LEVELS N/A 11/14/2013   Procedure: ANTERIOR CERVICAL DECOMPRESSION/DISCECTOMY FUSION 4 LEVELS;  Surgeon: Newman Pies, MD;  Location: [place];  Service: Neurosurgery;  Laterality: N/A;  C34 C45 C56 C67 anterior cervical fusion with interbody prosthesis plating and  bonegraft  . APPENDECTOMY  age 72  . BREAST LUMPECTOMY  06/16/2009   left; SLN bx.  [name] BREAST LUMPECTOMY  07/01/2009   re-excision  . BREAST SURGERY  1999   reduction  . CATARACT EXTRACTION, BILATERAL    . COLONOSCOPY WITH PROPOFOL N/A 12/03/2014   Procedure: COLONOSCOPY WITH PROPOFOL;  Surgeon: [name], MD;  [facility];  Service: Endoscopy;  Laterality: N/A;  . FOOT SURGERY  06/22/11   left  . KNEE ARTHROSCOPY  03/12/2005   right  . KNEE ARTHROSCOPY     left  . NASAL SINUS SURGERY     x 2  . 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 TRIGGER FINGER RELEASE  12/16/2011   Procedure: RELEASE TRIGGER FINGER/A-1 PULLEY;  Surgeon: Tennis Must, MD;  Location: [facility];  Service: Orthopedics;  Laterality: Right;  RIGHT LONG FINGER TRIGGER RELEASE & ANNULAR CYST EXCISION  . TUMOR EXCISION  age 80   right arm    There were no vitals filed for this visit.         Occupational Therapy Parkinson's Disease Screen  Physical Performance Test item #2 (simulated eating):  13.25 sec  9-hole peg test:    RUE  33.28 secs sec        LUE  32.56 sec  Change in ability to perform ADLs/IADLs:  No denies change   Pt does not require occupation therapy services at this time.  Recommended occupational therapy screen in   6 mons                              Patient will benefit from skilled therapeutic intervention in order to improve the following deficits and impairments:     Visit Diagnosis: Abnormal posture    Problem List Patient Active Problem List   Diagnosis Date Noted  . Cervical spondylosis with myelopathy and radiculopathy 11/14/2013  . Essential tremor 06/13/2013  . Cervical spondylosis without myelopathy 06/13/2013  . Breast  cancer of lower-outer quadrant of left female breast ([place]) 09/10/2010    [name],[name] 11/06/2015, 12:21 PM  [facility] [address] [place], [place], [address] Phone: [phone]   Fax:  [phone]  Name: April Church MRN: [id number] Date of Birth: 01-23-1941

## 2015-11-06 NOTE — Therapy (Signed)
[facility] [address] [place], [place], [address] Phone: [phone]   Fax:  [phone]  Patient Details  Name: April Church MRN: [id number] Date of Birth: [birth year] Referring Provider:  Ronnell Guadalajara*  Encounter Date: 11/06/2015  Speech Therapy Parkinson's Disease Screen   Decibel Level today: 68dB  (WNL=70-72 dB) with sound level meter 30cm away from pt's mouth. Pt's conversational is lower than WNL.  She rec'd Botox injection at [facility] earlier this year to normalize her voice. and is scheduled to return in late October for another injection, per pt. 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 Recommend she receive speech-language eval for dysarthria and/or voice at [facility] as she has had a course of ST there earlier this year.   Pt reports difficulty in swallowing during meals warranting objective evaluation.Re: reported swallowing difficulties - recommend modified barium swallow evaluation. If agreed, please order this evaluation to be completed at either [facility] or [facility].  Thank you. Feel free to contact me should you have any questions.   [facility] ,[place], [place]  11/06/2015, 8:41 AM  [facility] [address] [place], [place], [address] Phone: [phone]   Fax:  [phone]

## 2015-11-06 NOTE — Therapy (Signed)
[facility] [address] [place], [place], [address] Phone: [phone]   Fax:  [phone]  Patient Details  Name: April Church MRN: [id number] Date of Birth: [birth year] Referring Provider:  Ronnell Guadalajara*  Encounter Date: 11/06/2015   Physical Therapy Parkinson's Disease Screen   Timed Up and Go test:14.57 sec  10 meter walk test: 2.98 ft/sec  5 time sit to stand test: 21.60 sec    Patient does not require Physical Therapy services at this time.  Recommend Physical Therapy screen in no significant change in functional status since previous bout of therapy.  She is continuing to do therapy exercises; she is following up with orthopedist.  Recommend PT screen in 6 months.    Artice Bergerson W. 11/06/2015, 8:30 AM  Frazier Butt., PT  [facility] [address] [place], [place], [address] Phone: [phone]   Fax:  [phone]

## 2015-11-07 DIAGNOSIS — T43215A Adverse effect of selective serotonin and norepinephrine reuptake inhibitors, initial encounter: Secondary | ICD-10-CM | POA: Diagnosis not present

## 2015-11-07 DIAGNOSIS — J4530 Mild persistent asthma, uncomplicated: Secondary | ICD-10-CM | POA: Diagnosis not present

## 2015-11-07 DIAGNOSIS — T782XXD Anaphylactic shock, unspecified, subsequent encounter: Secondary | ICD-10-CM | POA: Diagnosis not present

## 2015-11-07 DIAGNOSIS — J300 Vasomotor rhinitis: Secondary | ICD-10-CM | POA: Diagnosis not present

## 2015-11-07 DIAGNOSIS — L270 Generalized skin eruption due to drugs and medicaments taken internally: Secondary | ICD-10-CM | POA: Diagnosis not present

## 2015-11-12 ENCOUNTER — Encounter: Payer: Self-pay | Admitting: Diagnostic Neuroimaging

## 2015-11-12 ENCOUNTER — Telehealth: Payer: Self-pay | Admitting: Diagnostic Neuroimaging

## 2015-11-12 ENCOUNTER — Ambulatory Visit (INDEPENDENT_AMBULATORY_CARE_PROVIDER_SITE_OTHER): Payer: Medicare Other | Admitting: Diagnostic Neuroimaging

## 2015-11-12 VITALS — BP 118/82 | HR 88 | Wt 133.2 lb

## 2015-11-12 DIAGNOSIS — R1310 Dysphagia, unspecified: Secondary | ICD-10-CM

## 2015-11-12 DIAGNOSIS — G250 Essential tremor: Secondary | ICD-10-CM | POA: Diagnosis not present

## 2015-11-12 DIAGNOSIS — G20 Parkinson's disease: Secondary | ICD-10-CM | POA: Diagnosis not present

## 2015-11-12 DIAGNOSIS — R498 Other voice and resonance disorders: Secondary | ICD-10-CM | POA: Diagnosis not present

## 2015-11-12 DIAGNOSIS — M4712 Other spondylosis with myelopathy, cervical region: Secondary | ICD-10-CM

## 2015-11-12 DIAGNOSIS — M4722 Other spondylosis with radiculopathy, cervical region: Secondary | ICD-10-CM

## 2015-11-12 MED ORDER — CARBIDOPA-LEVODOPA 25-100 MG PO TABS: 1.0000 | ORAL_TABLET | Freq: Three times a day (TID) | ORAL | 4 refills | Status: DC

## 2015-11-12 MED ORDER — CYCLOBENZAPRINE HCL 5 MG PO TABS: 5.0000 mg | ORAL_TABLET | Freq: Every evening | ORAL | 3 refills | Status: DC | PRN

## 2015-11-12 NOTE — Telephone Encounter (Signed)
Spoke to patient she is aware of all details of her apt . Swallow study.

## 2015-11-12 NOTE — Progress Notes (Signed)
[facility]  PATIENT: April Church DOB: 26-Aug-1940  REFERRING CLINICIAN:  HISTORY FROM: patient REASON FOR VISIT: follow up   Chief Complaint  Patient presents with  . Tremors    rm 7, "tremors are the same; had re eval for PT/OT- no need to return; may need swallowing therapy?"  . Follow-up    6 month    HISTORICAL  CHIEF COMPLAINT:  Chief Complaint  Patient presents with  . Tremors    rm 7, "tremors are the same; had re eval for PT/OT- no need to return; may need swallowing therapy?"  . Follow-up    6 month    HISTORY OF PRESENT ILLNESS:   UPDATE 11/12/15: Since last visit, tremors are stable. Balance stable. Now with night time neck, shoulder, spine and leg pain (spasm). Had PT and OT evals as well.   UPDATE 05/13/15: Since last visit, tremor in arms better. Had ST and botox for voice issues. Some wearing off of meds before mid-day dosing. No neck pain issues.   UPDATE 02/12/15: Since last visit, tremor is stable. More voice strangulation issues. Tolerating primidone 100mg  TID.  UPDATE 08/13/14: Since last visit, tried primidone 250mg  BID, but not much tremor benefit; also having more sleepiness in the day time. Still with hand > voice > chin tremor. No falls. Balance getting slightly worse.  UPDATE 05/22/14: Since last visit, tremor progressing. Now notes new resting tremor in the last year. Some balance and walking issues. She is s/p cervical decompression in Sept 2015, with good results.   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 Her tremor does not limit any of her activities of daily life. She denies any side effects of the Mysoline. She also reports today that she's had some numbness and tingling down her right arm from her neck and shoulder. It has been bothering her more in the last 6 months. 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 She saw Dr. Arnoldo Morale back in 2004. She returns for reevaluation  UPDATE 06/07/12 (VRP): Doing well. BUE tremor is stable and controlled. Voice tremor is slightly progressing. Tolerating primidone.  PRIOR HPI (06/09/11, VRP):  75 yrs old female returns for F/U. Last seen 06/09/10.  She has been followed in this office for many years for essential tremor. She takes primidone 50 mg, 1.5 TID. She tolerates it well. She comes in today saying that her tremor is a little worse before lunch.  The tremor limits her a little bit with eating and writing but for the most part she is happy with her level of function.She has hx of  breast cancer.  No neurologic complaints.     REVIEW OF SYSTEMS: Full 14 system review of systems performed and negative except for speech diff tremors.    ALLERGIES: Allergies  Allergen Reactions  . Shellfish Allergy Shortness Of Breath  . Duloxetine Itching and Rash  . Sulfa Drugs Cross Reactors Anxiety and Rash  . Codeine Anxiety    HOME MEDICATIONS: Outpatient Medications Prior to Visit  Medication Sig Dispense Refill  . albuterol (PROVENTIL HFA;VENTOLIN HFA) 108 (90 BASE) MCG/ACT inhaler Inhale 1 puff into the lungs every 6 (six) hours as needed for wheezing or shortness of breath.    [name] amLODipine (NORVASC) 5 MG tablet Take 1 tablet (5 mg total) by mouth 2 (two) times daily.    [name] aspirin  81 MG tablet Take 81 mg by mouth daily.      . beclomethasone (QVAR) 80 MCG/ACT inhaler Inhale 1 puff into the lungs 2 (two) times daily.     [name] CALCIUM CARBONATE PO Take 1 tablet by mouth 2 (two) times daily.    . carbidopa-levodopa (SINEMET IR) 25-100 MG tablet Take 1 tablet by mouth 3 (three) times daily before meals. 90 tablet 6  . Cholecalciferol (VITAMIN D) 1000 UNITS capsule Take 1,000 Units by mouth 2 (two) times daily.     . fexofenadine (ALLEGRA) 180 MG tablet Take 180 mg by mouth daily.    . fluticasone (FLONASE) 50 MCG/ACT nasal spray Place 1  spray into the nose 2 (two) times daily.     [name] ipratropium (ATROVENT) 0.03 % nasal spray Place 2 sprays into both nostrils 2 (two) times daily.    . Omega-3 Fatty Acids (FISH OIL) 1200 MG CAPS Take 1,200 mg by mouth 2 (two) times daily.     Vladimir Faster Glycol-Propyl Glycol (SYSTANE OP) Place 1 drop into both eyes 2 (two) times daily.    . primidone (MYSOLINE) 50 MG tablet Take 2 tablets (100 mg total) by mouth 3 (three) times daily. 540 tablet 4  . ranitidine (ZANTAC) 150 MG capsule Take 150 mg by mouth daily after breakfast.     . triamcinolone cream (KENALOG) 0.1 % Apply 1 application topically 2 (two) times daily as needed (FOR RASH).     [name] venlafaxine (EFFEXOR) 37.5 MG tablet Take 1 tablet (37.5 mg total) by mouth daily. 90 tablet 3   No facility-administered medications prior to visit.     PAST MEDICAL HISTORY: Past Medical History:  Diagnosis Date  . Asthma    daily inhaler  . Breast cancer ([place])    left- radiation and surgery -dx. 2011- no further tx. now- [name] , [name]  . Cyst of finger 11/2011   annular cyst right long finger  . Dental crowns present   . Dermatitis   . Frequency of urination   . GERD (gastroesophageal reflux disease)   . Hemorrhoid   . Hyperlipidemia   . Hypertension    under control, has been on med. x 2 yrs.  [name] PONV (postoperative nausea and vomiting)   . Seasonal allergies   . Tremors of nervous system    hands  . Trigger finger of right hand 11/2011   long finger    PAST SURGICAL HISTORY: Past Surgical History:  Procedure Laterality Date  . ANTERIOR CERVICAL DECOMPRESSION/DISCECTOMY FUSION 4 LEVELS N/A 11/14/2013   Procedure: ANTERIOR CERVICAL DECOMPRESSION/DISCECTOMY FUSION 4 LEVELS;  Surgeon: Newman Pies, MD;  Location: [facility];  Service: Neurosurgery;  Laterality: N/A;  C34 C45 C56 C67 anterior cervical fusion with interbody prosthesis plating and  bonegraft  . APPENDECTOMY  age 64  . BREAST LUMPECTOMY  06/16/2009   left;  SLN bx.  [name] BREAST LUMPECTOMY  07/01/2009   re-excision  . BREAST SURGERY  1999   reduction  . CATARACT EXTRACTION, BILATERAL    . COLONOSCOPY WITH PROPOFOL N/A 12/03/2014   Procedure: COLONOSCOPY WITH PROPOFOL;  Surgeon: [name], MD;  Location: [facility];  Service: Endoscopy;  Laterality: N/A;  . FOOT SURGERY  06/22/11   left  . KNEE ARTHROSCOPY  03/12/2005   right  . KNEE ARTHROSCOPY     left  . NASAL SINUS SURGERY     x 2  . 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 TRIGGER FINGER RELEASE  12/16/2011   Procedure: RELEASE TRIGGER FINGER/A-1 PULLEY;  Surgeon: Tennis Must, MD;  Location: [facility];  Service: Orthopedics;  Laterality: Right;  RIGHT LONG FINGER TRIGGER RELEASE & ANNULAR CYST EXCISION  . TUMOR EXCISION  age 68   right arm    FAMILY HISTORY: Family History  Problem Relation Age of Onset  . Kidney disease Mother   . Stroke Father   . Stroke Sister   . Diabetes Brother   . Heart disease Brother   . Cancer Sister     SOCIAL HISTORY:  Social History   Social History  . Marital status: Married    Spouse name: Marcello Moores   . Number of children: 1  . Years of education: HS   Occupational History  . Retired    Social History Main Topics  . Smoking status: Never Smoker  . Smokeless tobacco: Never Used  . Alcohol use No  . Drug use: No  . Sexual activity: Yes   Other Topics Concern  . Not on file   Social History Narrative      Pt lives at home with her spouse.  [name]    Patient has 1 child.    Patient is retired.    Patient has a HS education.    Patient is right handed.    Patient drinks caffeine occasionally.               PHYSICAL EXAM  Vitals:   11/12/15 1439  BP: 118/82  Pulse: 88  Weight: 133 lb 3.2 oz (60.4 kg)   Body mass index is 25.17 kg/m.  GENERAL EXAM: Patient is in no distress  CARDIOVASCULAR: Regular rate and rhythm, no murmurs, no carotid  bruits  NEUROLOGIC: MENTAL STATUS: awake, alert, language fluent, comprehension intact, naming intact; MASKED FACIES; NEG FRONTAL RELEASE SIGNS CRANIAL NERVE: pupils equal and reactive to light, visual fields full to confrontation, extraocular muscles intact, no nystagmus, facial sensation and strength symmetric, uvula midline, shoulder shrug symmetric, tongue midline; SOFT HOARSE VOICE MOTOR: normal bulk, POSTURAL AND ACTION TREMOR; INT REST TREMOR IN LUE WITH CONTRALATERAL RAM; BRADYKINESIA IN BUE and BLE; NO RIGIDITY; full strength in the BUE, BLE; INTERMITTENT MOUTH TREMOR SENSORY: normal and symmetric to light touch COORDINATION: finger-nose-finger, fine finger movements normal REFLEXES: deep tendon reflexes present and symmetric GAIT/STATION: narrow based gait; SLOW AND CAUTIOUS, DECR ARM SWING    DIAGNOSTIC DATA (LABS, IMAGING, TESTING) - I reviewed patient records, labs, notes, testing and imaging myself where available.  Lab Results  Component Value Date   WBC 5.2 01/20/2015   HGB 12.0 01/20/2015   HCT 36.1 01/20/2015   MCV 88.4 01/20/2015   PLT 293 01/20/2015      Component Value Date/Time   NA 136 01/20/2015 1326   K 4.1 01/20/2015 1326   CL 100 11/06/2013 1032   CL 102 06/30/2012 1231   CO2 27 01/20/2015 1326   GLUCOSE 66 (L) 01/20/2015 1326   GLUCOSE 77 06/30/2012 1231   BUN 13.9 01/20/2015 1326   CREATININE 0.9 01/20/2015 1326   CALCIUM 8.8 01/20/2015 1326   PROT 7.4 01/20/2015 1326   ALBUMIN 3.6 01/20/2015 1326   AST 17 01/20/2015 1326   ALT 13 01/20/2015 1326   ALKPHOS 86 01/20/2015 1326   BILITOT <0.30 01/20/2015 1326   GFRNONAA 83 (L) 11/06/2013 1032   GFRAA >90 11/06/2013 1032   No results found for: CHOL No results found for: HGBA1C  No results found for: VITAMINB12 No results found for: TSH   06/25/13 EMG/NCS 1. Possible right C7 radiculopathy, with chronic denervation changes in the right triceps muscle and spontaneous activity in the right C6-7  paraspinal muscles. 2. No evidence of widespread underlying large fiber neuropathy.  11/14/13 CXR [I reviewed images myself and agree with interpretation. -VRP]  - Anterior cervical disc fusion localization images as described.    ASSESSMENT AND PLAN  75 y.o. year old female  has a past medical history of Asthma; Breast cancer ([place]); Cyst of finger (11/2011); Dental crowns present; Dermatitis; Frequency of urination; GERD (gastroesophageal reflux disease); Hemorrhoid; Hyperlipidemia; Hypertension; PONV (postoperative nausea and vomiting); Seasonal allergies; Tremors of nervous system; and Trigger finger of right hand (11/2011). here with essential tremor since 1990's. Now with intermittent resting tremor AND bradykinesia since 2016. May represent essential tremor with superimposed (parkinson's disease vs cervical myelopathy sequelae).   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)  Parkinsonism, unspecified Parkinsonism type (HCC)  Cervical spondylosis with myelopathy and radiculopathy  Voice tremor  Essential tremor     PLAN: - add cyclobenzaprine 5mg  at bedtime for nocturnal muscle spasms (? 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 SLP modified barium swallow   Meds ordered this encounter  Medications  . carbidopa-levodopa (SINEMET IR) 25-100 MG tablet    Sig: Take 1 tablet by mouth 3 (three) times daily before meals.    Dispense:  270 tablet    Refill:  4  . cyclobenzaprine (FLEXERIL) 5 MG tablet    Sig: Take 1 tablet (5 mg total) by mouth at bedtime as needed for muscle spasms.    Dispense:  30 tablet    Refill:  3   Return in about 6  months (around 05/11/2016).     Penni Bombard, MD [id number], [id number] PM Certified in Neurology, Neurophysiology and Neuroimaging  [facility], [facility] [address], [place], [place] [address] [phone]

## 2015-11-12 NOTE — Telephone Encounter (Signed)
Called  Abigail Butts at [facility] study [id number]. Patient is scheduled  October 2nd arrive at 10:45 am. Check in Radiology 1st floor. No restrictions.  If  Patient can't make this apt please call number above for rescheduling.

## 2015-11-13 ENCOUNTER — Other Ambulatory Visit (HOSPITAL_COMMUNITY): Payer: Self-pay | Admitting: Diagnostic Neuroimaging

## 2015-11-13 DIAGNOSIS — R1310 Dysphagia, unspecified: Secondary | ICD-10-CM

## 2015-11-17 ENCOUNTER — Ambulatory Visit: Payer: Medicare Other | Admitting: Diagnostic Neuroimaging

## 2015-11-24 ENCOUNTER — Other Ambulatory Visit (HOSPITAL_COMMUNITY): Payer: Medicare Other

## 2015-11-24 ENCOUNTER — Ambulatory Visit (HOSPITAL_COMMUNITY): Payer: Medicare Other

## 2015-12-01 ENCOUNTER — Ambulatory Visit (HOSPITAL_COMMUNITY)
Admission: RE | Admit: 2015-12-01 | Discharge: 2015-12-01 | Disposition: A | Discharge status: Home / Self Care | Payer: Medicare Other | Source: Ambulatory Visit | Attending: Diagnostic Neuroimaging | Admitting: Diagnostic Neuroimaging

## 2015-12-01 DIAGNOSIS — G20 Parkinson's disease: Secondary | ICD-10-CM | POA: Diagnosis not present

## 2015-12-01 DIAGNOSIS — R1310 Dysphagia, unspecified: Secondary | ICD-10-CM

## 2015-12-04 DIAGNOSIS — E7800 Pure hypercholesterolemia, unspecified: Secondary | ICD-10-CM | POA: Diagnosis not present

## 2015-12-12 DIAGNOSIS — F329 Major depressive disorder, single episode, unspecified: Secondary | ICD-10-CM | POA: Diagnosis not present

## 2015-12-12 DIAGNOSIS — Z780 Asymptomatic menopausal state: Secondary | ICD-10-CM | POA: Diagnosis not present

## 2015-12-12 DIAGNOSIS — G20 Parkinson's disease: Secondary | ICD-10-CM | POA: Diagnosis not present

## 2015-12-12 DIAGNOSIS — C50512 Malignant neoplasm of lower-outer quadrant of left female breast: Secondary | ICD-10-CM | POA: Diagnosis not present

## 2015-12-12 DIAGNOSIS — E785 Hyperlipidemia, unspecified: Secondary | ICD-10-CM | POA: Diagnosis not present

## 2015-12-12 DIAGNOSIS — Z23 Encounter for immunization: Secondary | ICD-10-CM | POA: Diagnosis not present

## 2015-12-12 DIAGNOSIS — J385 Laryngeal spasm: Secondary | ICD-10-CM | POA: Diagnosis not present

## 2015-12-12 DIAGNOSIS — J45909 Unspecified asthma, uncomplicated: Secondary | ICD-10-CM | POA: Diagnosis not present

## 2015-12-15 DIAGNOSIS — B0230 Zoster ocular disease, unspecified: Secondary | ICD-10-CM | POA: Diagnosis not present

## 2015-12-15 DIAGNOSIS — B0239 Other herpes zoster eye disease: Secondary | ICD-10-CM | POA: Diagnosis not present

## 2015-12-29 DIAGNOSIS — B0229 Other postherpetic nervous system involvement: Secondary | ICD-10-CM | POA: Diagnosis not present

## 2015-12-29 DIAGNOSIS — I10 Essential (primary) hypertension: Secondary | ICD-10-CM | POA: Diagnosis not present

## 2015-12-30 DIAGNOSIS — B0239 Other herpes zoster eye disease: Secondary | ICD-10-CM | POA: Diagnosis not present

## 2016-01-07 ENCOUNTER — Telehealth: Payer: Self-pay | Admitting: Adult Health

## 2016-01-07 NOTE — Telephone Encounter (Signed)
Returned call to patient in regards to rescheduling 11/16 appointment. LVM

## 2016-01-08 ENCOUNTER — Ambulatory Visit (HOSPITAL_BASED_OUTPATIENT_CLINIC_OR_DEPARTMENT_OTHER): Payer: Medicare Other | Admitting: Adult Health

## 2016-01-08 VITALS — BP 126/82 | HR 98 | Temp 97.8°F | Resp 17 | Ht 61.0 in | Wt 132.4 lb

## 2016-01-08 DIAGNOSIS — C50512 Malignant neoplasm of lower-outer quadrant of left female breast: Secondary | ICD-10-CM

## 2016-01-08 DIAGNOSIS — Z8041 Family history of malignant neoplasm of ovary: Secondary | ICD-10-CM

## 2016-01-08 DIAGNOSIS — G20 Parkinson's disease: Secondary | ICD-10-CM

## 2016-01-08 DIAGNOSIS — Z853 Personal history of malignant neoplasm of breast: Secondary | ICD-10-CM

## 2016-01-08 DIAGNOSIS — Z170 Estrogen receptor positive status [ER+]: Principal | ICD-10-CM

## 2016-01-08 NOTE — Progress Notes (Signed)
CLINIC:  Survivorship   REASON FOR VISIT:  Routine follow-up for history of breast cancer.   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      07/23/2009 - 09/03/2009 Radiation Therapy    Adjuvant radiation therapy      10/13/2009 - 10/15/2013 Anti-estrogen oral therapy    Initially letrozole 2.5 mg daily 2 years then switched to tamoxifen then once again switched the Arimidex October 2013        INTERVAL HISTORY:  Ms. Leppla presents to the [facility] today for routine follow-up for her history of breast cancer. Regarding her breast cancer, she is doing very well. Her last mammogram was in 02/2015 and was negative. She does have periodic sharp/shooting pain to the left breast, which has been chronic since her lumpectomy.  She was recently diagnosed with shingles to her face, involving her right eyelid about 4 weeks ago.  She was treated and seen by her ophthalmologist and was told "everything is okay." She continues to have some neuropathic pain, but the gabapentin helps. The shingles lesions are healing and no longer open.   She endorses hand weakness, which has been chronic for quite some time. She tells me that she was diagnosed with Parkinson's disease about one year ago, and sees a neurologist.  Of note, she is a kidney donor. She donated her kidney to her husband about 8 years ago. She sees returns plan specialist at [facility] annually.  She sees her PCP and gynecologist regularly. She has had her flu and pneumonia vaccines. She was told she could not get the shingles vaccine because she is a kidney donor.    REVIEW OF SYSTEMS:  Review of Systems  Constitutional: Negative.   HENT:  Negative.   Eyes: Negative.   Respiratory: Negative.   Cardiovascular: Negative.   Gastrointestinal:  Negative.   Endocrine: Negative.   Genitourinary: Negative.    Skin: Negative.        Resolving shingles outbreak to the right side of the face  Neurological: Positive for extremity weakness.       Hand weakness & occasional tremor secondary to Parkinson's disease  Psychiatric/Behavioral: Negative.   Breast: Denies any new nodularity, masses, nipple changes, or nipple discharge. (+) chronic sharp/shooting pain to left breast; largely unchanged from previous.    A 14-point review of systems was completed and was negative, except as noted above.    PAST MEDICAL/SURGICAL HISTORY:  Past Medical History:  Diagnosis Date  . Asthma    daily inhaler  . Breast cancer ([place])    left- radiation and surgery -dx. 2011- no further tx. now- [name] , [name]  . Cyst of finger 11/2011   annular cyst right long finger  . Dental crowns present   . Dermatitis   . Frequency of urination   . GERD (gastroesophageal reflux disease)   . Hemorrhoid   . Hyperlipidemia   . Hypertension    under control, has been on med. x 2 yrs.  [name] PONV (postoperative nausea and vomiting)   . Seasonal allergies   . Tremors of nervous system    hands  . Trigger finger of right hand 11/2011   long finger   Past Surgical History:  Procedure Laterality Date  . ANTERIOR CERVICAL DECOMPRESSION/DISCECTOMY FUSION 4 LEVELS N/A 11/14/2013  Procedure: ANTERIOR CERVICAL DECOMPRESSION/DISCECTOMY FUSION 4 LEVELS;  Surgeon: Newman Pies, MD;  Location: [facility];  Service: Neurosurgery;  Laterality: N/A;  C34 C45 C56 C67 anterior cervical fusion with interbody prosthesis plating and  bonegraft  . APPENDECTOMY  age 15  . BREAST LUMPECTOMY  06/16/2009   left; SLN bx.  [name] BREAST LUMPECTOMY  07/01/2009   re-excision  . BREAST SURGERY  1999   reduction  . CATARACT EXTRACTION, BILATERAL    . COLONOSCOPY WITH PROPOFOL N/A 12/03/2014   Procedure: COLONOSCOPY WITH PROPOFOL;  Surgeon: [name], MD;  Location: [facility]  ENDOSCOPY;  Service: Endoscopy;  Laterality: N/A;  . FOOT SURGERY  06/22/11   left  . KNEE ARTHROSCOPY  03/12/2005   right  . KNEE ARTHROSCOPY     left  . NASAL SINUS SURGERY     x 2  . 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 TRIGGER FINGER RELEASE  12/16/2011   Procedure: RELEASE TRIGGER FINGER/A-1 PULLEY;  Surgeon: Tennis Must, MD;  Location: [facility];  Service: Orthopedics;  Laterality: Right;  RIGHT LONG FINGER TRIGGER RELEASE & ANNULAR CYST EXCISION  . TUMOR EXCISION  age 35   right arm     ALLERGIES:  Allergies  Allergen Reactions  . Shellfish Allergy Shortness Of Breath  . Duloxetine Itching and Rash  . Sulfa Drugs Cross Reactors Anxiety and Rash  . Codeine Anxiety     CURRENT MEDICATIONS:  Outpatient Encounter Prescriptions as of 01/08/2016  Medication Sig Note  . albuterol (PROVENTIL HFA;VENTOLIN HFA) 108 (90 BASE) MCG/ACT inhaler Inhale 1 puff into the lungs every 6 (six) hours as needed for wheezing or shortness of breath.   [name] amLODipine (NORVASC) 5 MG tablet Take 1 tablet (5 mg total) by mouth 2 (two) times daily.   [name] aspirin 81 MG tablet Take 81 mg by mouth daily.     [name] atorvastatin (LIPITOR) 20 MG tablet Take 20 mg by mouth daily.   . beclomethasone (QVAR) 80 MCG/ACT inhaler Inhale 1 puff into the lungs 2 (two) times daily.    [name] CALCIUM CARBONATE PO Take 1 tablet by mouth 2 (two) times daily.   . carbidopa-levodopa (SINEMET IR) 25-100 MG tablet Take 1 tablet by mouth 3 (three) times daily before meals.   . Cholecalciferol (VITAMIN D) 1000 UNITS capsule Take 1,000 Units by mouth 2 (two) times daily.    . Coenzyme Q10 (CO Q 10) 100 MG CAPS Take by mouth.   . cyclobenzaprine (FLEXERIL) 5 MG tablet Take 1 tablet (5 mg total) by mouth at bedtime as needed for muscle spasms.   [name] EPINEPHrine 0.3 mg/0.3 mL IJ SOAJ injection as needed. 11/12/2015: Received from: [facility] Received Sig: Use as  directed.  . fexofenadine (ALLEGRA) 180 MG tablet Take 180 mg by mouth daily.   . fluticasone (FLONASE) 50 MCG/ACT nasal spray Place 1 spray into the nose 2 (two) times daily.    [name] ipratropium (ATROVENT) 0.03 % nasal spray Place 2 sprays into both nostrils 2 (two) times daily.   . Omega-3 Fatty Acids (FISH OIL) 1200 MG CAPS Take 1,200 mg by mouth 2 (two) times daily.    Vladimir Faster Glycol-Propyl Glycol (SYSTANE OP) Place 1 drop into both eyes 2 (two) times daily.   . primidone (MYSOLINE) 50 MG tablet Take 2 tablets (100 mg total) by mouth 3 (three) times daily.   . ranitidine (ZANTAC) 150 MG capsule  Take 150 mg by mouth daily after breakfast.    . triamcinolone cream (KENALOG) 0.1 % Apply 1 application topically 2 (two) times daily as needed (FOR RASH).    [name] venlafaxine (EFFEXOR) 37.5 MG tablet Take 1 tablet (37.5 mg total) by mouth daily.    No facility-administered encounter medications on file as of 01/08/2016.      ONCOLOGIC FAMILY HISTORY:  Family History  Problem Relation Age of Onset  . Kidney disease Mother   . Stroke Father   . Stroke Sister   . Diabetes Brother   . Heart disease Brother   . Cancer Sister     GENETIC COUNSELING/TESTING: No records available for review.    SOCIAL HISTORY:  NEVIN KOZUCH is married to her husband of 69 years. They live in [place], [place]. They have 1 adult daughter, who lives in [place]. They have 2 grandsons, ages 77 and 2. The 46 year old grandson was recently married and lives in [place]; the 33 year old grandsonis a sophomore at [employer]. Ms. Desanto is retired.  She denies any current tobacco, alcohol, or illicit drug use.    PHYSICAL EXAMINATION:  Vital Signs: Vitals:   01/08/16 1308  BP: 126/82  Pulse: 98  Resp: 17  Temp: 97.8 F (36.6 C)   Filed Weights   01/08/16 1308  Weight: 132 lb 6.4 oz (60.1 kg)   General: Well-nourished, well-appearing female in no acute distress.  Unaccompanied today.     HEENT: Head is normocephalic.  Pupils equal and reactive to light. Conjunctivae clear without exudate.  Sclerae anicteric. Healed vesicular rash on right forehead to right eyelid; no open lesions. Oral mucosa is pink, moist.  Oropharynx is pink without lesions or erythema.  Lymph: No cervical, supraclavicular, or infraclavicular lymphadenopathy noted on palpation.  Cardiovascular: Regular rate and rhythm. Respiratory: Clear to auscultation bilaterally. Chest expansion symmetric; breathing non-labored.  Breast Exam:  -Left breast: s/p mammoplasty. About 1 cm area of nodularity noted to periareola region at approx 1 o'clock; previously imaged/biopsied and shown to be dense stromal fibrosis; No other appreciable masses. No skin redness, thickening, or peau d'orange appearance; healed scar without erythema or nodularity.  -Right breast: No appreciable masses on palpation. No skin redness, thickening, or peau d'orange appearance; no nipple retraction or nipple discharge. -Axilla: No axillary adenopathy bilaterally.  GI: Abdomen soft and round; non-tender, non-distended. Bowel sounds normoactive. No hepatosplenomegaly.   GU: Deferred.  Neuro: No focal deficits. Steady gait.  Psych: Mood and affect normal and appropriate for situation.  Extremities: No edema. Skin: Warm and dry.  LABORATORY DATA:  None for this visit.   DIAGNOSTIC IMAGING:  Most recent mammogram: 03/20/15 [facility])      ASSESSMENT AND PLAN:  Ms.. Kilbride is a pleasant 75 y.o. female with history of Stage IA left breast invasive ductal carcinoma, ER+/PR+/HER2-, diagnosed in 05/2009;  treated with lumpectomy, adjuvant radiation therapy, and anti-estrogen therapy with Letrozole beginning in 09/2009; she was later switched to Tamoxifen and then switched again to Anastrozole in 12/2011; she completed 5 years of anti-estrogen therapy in 09/2013.  [facility] for surveillance and routine follow-up.   1. History  of Stage IA left breast cancer:  Ms. Moldovan is currently clinically and radiographically without evidence of disease or recurrence of breast cancer. She will be due for mammogram in 02/2016; orders placed today.  She has completed 5 years of anti-estrogen therapy.  She will return to the cancer center to see Survivorship NP, in  12/2016.  I encouraged her to call me with any questions or concerns before her next visit at the cancer center, and I would be happy to see her sooner, if needed.    2. Bone health:  Given Ms. Gent's age, history of breast cancer, and her previous anti-estrogen therapy with aromatase inhibitors, she is at risk for bone demineralization. Her last DEXA scan was on 07/26/13 and was normal. She is scheduled for biennial DEXA scan on 01/20/16 at [facility]. Her bone density is managed by her PCP.  In the meantime, she was encouraged to increase her consumption of foods rich in calcium, as well as increase her weight-bearing activities.  She was given education on specific food and activities to promote bone health.  3. Cancer screening:  Due to Ms. Cegielski's history and her age, she should receive screening for skin cancers, colon cancer, and gynecologic cancers. Of note, her sister died from ovarian cancer. Therefore, Ms. Chea sees her gynecologist regularly (Dr. Ronita Hipps) and he performs pelvis ultrasound every 6 months.  She was encouraged to follow-up with her PCP/gynecologist for appropriate cancer screenings.   4. Health maintenance and wellness promotion: Ms. Connell was encouraged to consume 5-7 servings of fruits and vegetables per day. She was also encouraged to engage in moderate to vigorous exercise for 30 minutes per day most days of the week. She was instructed to limit her alcohol consumption and continue to abstain from tobacco use.    Dispo:  -Annual mammogram due in 02/2016; orders placed today.  -Return to cancer center to see Survivorship NP in  12/2016.   A total of 30 minutes of face-to-face time was spent with this patient with greater than 50% of that time in counseling and care-coordination.   Mike Craze, NP Survivorship Program [facility] [address] [phone]   Note: PRIMARY CARE PROVIDER Ileana Roup, [place] [phone]

## 2016-01-09 ENCOUNTER — Encounter: Payer: Self-pay | Admitting: Adult Health

## 2016-01-20 ENCOUNTER — Encounter: Payer: Medicare Other | Admitting: Nurse Practitioner

## 2016-01-29 DIAGNOSIS — L820 Inflamed seborrheic keratosis: Secondary | ICD-10-CM | POA: Diagnosis not present

## 2016-01-29 DIAGNOSIS — D1801 Hemangioma of skin and subcutaneous tissue: Secondary | ICD-10-CM | POA: Diagnosis not present

## 2016-01-29 DIAGNOSIS — L821 Other seborrheic keratosis: Secondary | ICD-10-CM | POA: Diagnosis not present

## 2016-01-29 DIAGNOSIS — L814 Other melanin hyperpigmentation: Secondary | ICD-10-CM | POA: Diagnosis not present

## 2016-02-03 DIAGNOSIS — Z780 Asymptomatic menopausal state: Secondary | ICD-10-CM | POA: Diagnosis not present

## 2016-02-18 DIAGNOSIS — J45909 Unspecified asthma, uncomplicated: Secondary | ICD-10-CM | POA: Diagnosis not present

## 2016-03-15 ENCOUNTER — Other Ambulatory Visit: Payer: Self-pay | Admitting: Diagnostic Neuroimaging

## 2016-03-23 ENCOUNTER — Encounter: Payer: Self-pay | Admitting: Hematology and Oncology

## 2016-03-23 DIAGNOSIS — Z853 Personal history of malignant neoplasm of breast: Secondary | ICD-10-CM | POA: Diagnosis not present

## 2016-03-23 DIAGNOSIS — M542 Cervicalgia: Secondary | ICD-10-CM | POA: Diagnosis not present

## 2016-03-23 DIAGNOSIS — I10 Essential (primary) hypertension: Secondary | ICD-10-CM | POA: Diagnosis not present

## 2016-03-23 DIAGNOSIS — M4712 Other spondylosis with myelopathy, cervical region: Secondary | ICD-10-CM | POA: Diagnosis not present

## 2016-04-12 DIAGNOSIS — B0230 Zoster ocular disease, unspecified: Secondary | ICD-10-CM | POA: Diagnosis not present

## 2016-04-12 DIAGNOSIS — Z961 Presence of intraocular lens: Secondary | ICD-10-CM | POA: Diagnosis not present

## 2016-04-29 ENCOUNTER — Ambulatory Visit: Payer: Medicare Other | Attending: Diagnostic Neuroimaging

## 2016-04-29 ENCOUNTER — Ambulatory Visit: Payer: Medicare Other | Admitting: Occupational Therapy

## 2016-04-29 ENCOUNTER — Ambulatory Visit: Payer: Medicare Other | Admitting: Physical Therapy

## 2016-04-29 DIAGNOSIS — R2689 Other abnormalities of gait and mobility: Secondary | ICD-10-CM

## 2016-04-29 DIAGNOSIS — R278 Other lack of coordination: Secondary | ICD-10-CM | POA: Insufficient documentation

## 2016-04-29 DIAGNOSIS — R1312 Dysphagia, oropharyngeal phase: Secondary | ICD-10-CM | POA: Insufficient documentation

## 2016-04-29 DIAGNOSIS — R471 Dysarthria and anarthria: Secondary | ICD-10-CM | POA: Insufficient documentation

## 2016-04-29 DIAGNOSIS — R29898 Other symptoms and signs involving the musculoskeletal system: Secondary | ICD-10-CM | POA: Insufficient documentation

## 2016-04-29 DIAGNOSIS — R490 Dysphonia: Secondary | ICD-10-CM | POA: Insufficient documentation

## 2016-04-29 DIAGNOSIS — R293 Abnormal posture: Secondary | ICD-10-CM | POA: Insufficient documentation

## 2016-04-29 DIAGNOSIS — R29818 Other symptoms and signs involving the nervous system: Secondary | ICD-10-CM | POA: Insufficient documentation

## 2016-04-29 NOTE — Therapy (Signed)
[facility] [address] [place], [place], [address] Phone: [phone]   Fax:  [phone]  Patient Details  Name: April Church MRN: [id number] Date of Birth: [birth year] Referring Provider:  Penni Bombard, MD  Encounter Date: 04/29/2016  Occupational Therapy Parkinson's Disease Screen  Hand dominance: RUE   Physical Performance Test item #2 (simulated eating):  14.81 sec  9-hole peg test:    RUE  37.72 sec, pt noted to have increased difficulty manipulating pegs, bradykinesia present.        LUE  28.15 sec  Change in ability to perform ADLs/IADLs:  Increased difficulty fastening buttons, opening bags and containers. Pt reports handwriting becomes small after writing for a while.  **  Pt would benefit from occupational therapy evaluation due to  Change in ADLS and decline in fine motor coordination.  [name],[name] 04/29/2016, 8:57 AM  [facility] [address] [place], [place], [address] Phone: [phone]   Fax:  [phone]

## 2016-04-29 NOTE — Therapy (Signed)
[facility] [address] [place], [place], [address] Phone: [phone]   Fax:  [phone]  Patient Details  Name: April Church MRN: [id number] Date of Birth: 09-30-1940 Referring Provider:  Penni Bombard, MD  Encounter Date: 04/29/2016  Physical Therapy Parkinson's Disease Screen   Timed Up and Go test:12.97 sec  10 meter walk test:3.56 ft/sec  5 time sit to stand test:18.34 sec  Patient does not require Physical Therapy services at this time.  Recommend Physical Therapy screen in 6-9 months.  Pt's mobility measures compared to previous screen appear to be improved.  No reported falls.         Trejuan Matherne W. 04/29/2016, 8:31 AM [facility] [address] [place], [place], [address] Phone: [phone]   Fax:  [phone]

## 2016-04-29 NOTE — Therapy (Signed)
[facility] [address] [place], [place], [address] Phone: [phone]   Fax:  [phone]  Patient Details  Name: April Church MRN: [id number] Date of Birth: 02-01-1941 Referring Provider:  Penni Bombard, MD  Encounter Date: 04/29/2016  Speech Therapy Parkinson's Disease Screen   Decibel Level today: 71dB  (WNL=70-72 dB) with sound level meter 30cm away from pt's mouth. Pt's conversational volume is WNL.  Pt has experienced difficulty in swallowing since last screen, reporting she gets "strangled" approx 3 times per week with liquids. Modified barium swallow exam in Oct 2017 indicated pt should cont with thin liquids along with regular diet. SLP today suggested that with liquids, pt pause after taking a single sip, and then swallow with effort.  Regarding her voice, pt agreed to revisit material and suggestions made to her from [place] [facility] over the next 6 months and see if these improve her reported breathiness and hoarseness. Pt appears to be speaking at the end of breath cycle and straining to articulate the last 1-2 words of utterances approx 25-50% of the time. SLP suggested pt stop and take another breath whenever she notices this.  Pt does does not require speech therapy services at this time. Recommend ST screen in another 6 months.   [place] ,[place], [place]  04/29/2016, 8:48 AM  [facility] [address] [place], [place], [address] Phone: [phone]   Fax:  [phone]

## 2016-05-03 ENCOUNTER — Other Ambulatory Visit: Payer: Self-pay | Admitting: Diagnostic Neuroimaging

## 2016-05-03 DIAGNOSIS — G20 Parkinson's disease: Secondary | ICD-10-CM

## 2016-05-06 ENCOUNTER — Ambulatory Visit: Payer: Medicare Other | Admitting: Occupational Therapy

## 2016-05-06 DIAGNOSIS — R293 Abnormal posture: Secondary | ICD-10-CM

## 2016-05-06 DIAGNOSIS — R278 Other lack of coordination: Secondary | ICD-10-CM | POA: Diagnosis not present

## 2016-05-06 DIAGNOSIS — R471 Dysarthria and anarthria: Secondary | ICD-10-CM | POA: Diagnosis not present

## 2016-05-06 DIAGNOSIS — R29818 Other symptoms and signs involving the nervous system: Secondary | ICD-10-CM

## 2016-05-06 DIAGNOSIS — R2689 Other abnormalities of gait and mobility: Secondary | ICD-10-CM | POA: Diagnosis not present

## 2016-05-06 DIAGNOSIS — R1312 Dysphagia, oropharyngeal phase: Secondary | ICD-10-CM | POA: Diagnosis not present

## 2016-05-06 DIAGNOSIS — R29898 Other symptoms and signs involving the musculoskeletal system: Secondary | ICD-10-CM | POA: Diagnosis not present

## 2016-05-06 DIAGNOSIS — R490 Dysphonia: Secondary | ICD-10-CM | POA: Diagnosis not present

## 2016-05-06 NOTE — Therapy (Signed)
[facility] [address] [place], [place], [address] Phone: [phone]   Fax:  [phone]  Occupational Therapy Treatment  Patient Details  Name: April Church MRN: [id number] Date of Birth: [birth year] Referring Provider: Dr. Leta Baptist  Encounter Date: 05/06/2016      OT End of Session - 05/06/16 1124    Visit Number 1   Number of Visits 13   Date for OT Re-Evaluation 07/04/16   Authorization Type Medicare   Authorization Time Period 25 days(05/06/16-07/04/16)   Authorization - Visit Number 1   Authorization - Number of Visits 10   OT Start Time 1021   OT Stop Time 1100   OT Time Calculation (min) 39 min   Activity Tolerance Patient tolerated treatment well   Behavior During Therapy [facility] for tasks assessed/performed      Past Medical History:  Diagnosis Date  . Asthma    daily inhaler  . Breast cancer ([place])    left- radiation and surgery -dx. 2011- no further tx. now- [name] , [name]  . Cyst of finger 11/2011   annular cyst right long finger  . Dental crowns present   . Dermatitis   . Frequency of urination   . GERD (gastroesophageal reflux disease)   . Hemorrhoid   . Hyperlipidemia   . Hypertension    under control, has been on med. x 2 yrs.  [name] PONV (postoperative nausea and vomiting)   . Seasonal allergies   . Tremors of nervous system    hands  . Trigger finger of right hand 11/2011   long finger    Past Surgical History:  Procedure Laterality Date  . ANTERIOR CERVICAL DECOMPRESSION/DISCECTOMY FUSION 4 LEVELS N/A 11/14/2013   Procedure: ANTERIOR CERVICAL DECOMPRESSION/DISCECTOMY FUSION 4 LEVELS;  Surgeon: Newman Pies, MD;  Location: [facility];  Service: Neurosurgery;  Laterality: N/A;  C34 C45 C56 C67 anterior cervical fusion with interbody prosthesis plating and  bonegraft  . APPENDECTOMY  age 11  . BREAST LUMPECTOMY  06/16/2009   left; SLN bx.  [name] BREAST LUMPECTOMY   07/01/2009   re-excision  . BREAST SURGERY  1999   reduction  . CATARACT EXTRACTION, BILATERAL    . COLONOSCOPY WITH PROPOFOL N/A 12/03/2014   Procedure: COLONOSCOPY WITH PROPOFOL;  Surgeon: [name], MD;  Location: [facility];  Service: Endoscopy;  Laterality: N/A;  . FOOT SURGERY  06/22/11   left  . KNEE ARTHROSCOPY  03/12/2005   right  . KNEE ARTHROSCOPY     left  . NASAL SINUS SURGERY     x 2  . 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 TRIGGER FINGER RELEASE  12/16/2011   Procedure: RELEASE TRIGGER FINGER/A-1 PULLEY;  Surgeon: Tennis Must, MD;  Location: [facility];  Service: Orthopedics;  Laterality: Right;  RIGHT LONG FINGER TRIGGER RELEASE & ANNULAR CYST EXCISION  . TUMOR EXCISION  age 39   right arm    There were no vitals filed for this visit.      Subjective Assessment - 05/06/16 1113    Subjective  Pt with PD and essential tremor presents with decreased coordination which impacts ADLS   Pertinent History see Epic, PD, essential tremor, hx of breast CA, hx of cervical decompression   Patient Stated Goals improve coordination for ADLs   Currently in Pain? No/denies            [facility] OT Assessment -  05/06/16 1036      Assessment   Diagnosis Parkinson's disease, essential tremor   Referring Provider Dr. Leta Baptist   Onset Date 05/03/16  referral   Prior Therapy OT     Precautions   Precautions Fall     Balance Screen   Has the patient fallen in the past 6 months No   Has the patient had a decrease in activity level because of a fear of falling?  No   Is the patient reluctant to leave their home because of a fear of falling?  No     Home  Environment   Family/patient expects to be discharged to: Private residence   Living Arrangements --  Townhouse   Type of [place]   Lives With Spouse     ADL   Eating/Feeding Modified independent  increased spills   Grooming Modified  independent  difficulty fastening chains   Upper Body Bathing Modified independent   Lower Body Bathing Modified independent   Upper Body Dressing Needs assist for fasteners;Increased time   Lower Body Dressing Increased time   Toilet Tranfer Modified independent   Tub/Shower Transfer Modified independent  walk in shower   ADL comments difficultly opening containers opening plastic bags, carrying a coffee cup without spills     IADL   Shopping Takes care of all shopping needs independently   Light Housekeeping Performs light daily tasks such as dishwashing, bed making  has assist with heavier cleaning   Meal Prep Plans, prepares and serves adequate meals independently   Medication Management Is responsible for taking medication in correct dosages at correct time   Financial Management --  does mostly online bill pay     Mobility   Mobility Status Independent     Written Expression   Dominant Hand Right   Handwriting 100% legible  Pt reports decreased legibility after several sentences.     Vision Assessment   Vision Assessment Vision not tested     Cognition   Overall Cognitive Status Within Functional Limits for tasks assessed   Memory --  Pt reports possbilble short term memory deficits.     Observation/Other Assessments   Other Surveys  Select   Physical Performance Test   Yes   Simulated Eating Time (seconds) 27.53 sec  1 drop, significant tremor noted    Donning Doffing Jacket Time (seconds) 13.78 secs   Donning Doffing Jacket Comments 3 button/ unbutton: 38.22 secs     Sensation   Light Touch Appears Intact     Coordination   Fine Motor Movements are Fluid and Coordinated No   9 Hole Peg Test Right;Left   Right 9 Hole Peg Test 41.75 secs   Left 9 Hole Peg Test 26.68 secs   Box and Blocks RUE 44 blocks  LUE 38 blocks     Tone   Assessment Location Right Upper Extremity     ROM / Strength   AROM / PROM / Strength AROM     AROM   Overall AROM Comments  Pt demonstrates bilateral UE A/ROM grossly WFLS except for elbow extension is slightly limited bilaterally, RUE-20(hx of elbow surgery)  LUE -10( elbow ext)     RUE Tone   RUE Tone Mild  rigidity                            OT Short Term Goals - 05/06/16 1137  OT SHORT TERM GOAL #1   Title Pt will be independent with HEP--check 06/06/16   Time 4   Period Weeks   Status New     OT SHORT TERM GOAL #2   Title Pt will verbalize understanding of ways to prevent future complications and verbalize understanding of community resources.   Time 4   Period Weeks   Status New           OT Long Term Goals - 05/06/16 1139      OT LONG TERM GOAL #1   Title Pt will verbalize understanding of strategies/AE to incr ease with ADLs (including eating, writing, buttoning).   Time 8   Period Weeks   Status New     OT LONG TERM GOAL #2   Title Pt will improve coordination for ADLs (fastening jewerly) as shown by improving time on 9-hole peg test by at least 5sec with R hand.   Baseline 41.75 secs   Time 8   Period Weeks   Status New     OT LONG TERM GOAL #3   Title Pt will improve coordination/functional reaching for ADLs as shown by improving score on box and blocks with LUE by at least 4 blocks   Baseline LUE 38 blocks   Time 8   Period Weeks   Status New     OT LONG TERM GOAL #4   Title Pt will button/unbutton 3 buttons on tabletop in 33 sec or less.   Baseline 38.22   Time 8   Period Weeks   Status New     OT LONG TERM GOAL #5   Title Pt will improve time on PPT#2 (simulated eating) to 20 sec or less using AE/strategies prn.   Baseline 27.53 secs with 1 drop   Time 8   Period Weeks   Status New               Plan - 05/06/16 1126    Clinical Impression Statement Pt with Parkinson's disease, hx of essential tremor, with PMH significant for breast CA, cervical decompression and kidney donation to her spouse, presents with decreased  coordination, tremor, bradykinesia, mild rigidity, and abnormal posture which impedes performance of daily activities. Pt can benefit from skilled occupational therapy to maximize functional indepedence with ADLs/IADLS and to maintain quality of life.   Rehab Potential Good   OT Frequency --  12 visits plus eval   OT Duration 8 weeks   OT Treatment/Interventions Self-care/ADL training;Moist Heat;Fluidtherapy;DME and/or AE instruction;Patient/family education;Balance training;Therapeutic exercises;Ultrasound;Therapeutic exercise;Therapeutic activities;Passive range of motion;Cognitive remediation/compensation;Energy conservation;Manual Therapy;Cryotherapy;Neuromuscular education;Functional Mobility Training   Plan coordination HEP   Consulted and Agree with Plan of Care Patient      Patient will benefit from skilled therapeutic intervention in order to improve the following deficits and impairments:  Abnormal gait, Decreased knowledge of use of DME, Impaired flexibility, Decreased coordination, Decreased endurance, Decreased range of motion, Decreased strength, Impaired tone, Decreased balance, Decreased knowledge of precautions, Decreased safety awareness, Impaired perceived functional ability, Impaired UE functional use  Visit Diagnosis: Other lack of coordination - Plan: Ot plan of care cert/re-cert  Abnormal posture - Plan: Ot plan of care cert/re-cert  Other symptoms and signs involving the musculoskeletal system - Plan: Ot plan of care cert/re-cert  Other symptoms and signs involving the nervous system - Plan: Ot plan of care cert/re-cert      G-Codes - 16/10/96 1150    Functional Assessment Tool Used (Outpatient only) 9 hole peg test  RUE 41.75, LUE 26.68 secs, 3 button/ unbutton 38.22 secs, PPT#2: 27.53 with 1 drop   Functional Limitation Self care   Self Care Current Status (T5176) At least 20 percent but less than 40 percent impaired, limited or restricted   Self Care Goal Status  (H6073) At least 1 percent but less than 20 percent impaired, limited or restricted      Problem List Patient Active Problem List   Diagnosis Date Noted  . Cervical spondylosis with myelopathy and radiculopathy 11/14/2013  . Essential tremor 06/13/2013  . Cervical spondylosis without myelopathy 06/13/2013  . Breast cancer of lower-outer quadrant of left female breast ([place]) 09/10/2010    April Church 05/06/2016, 12:19 PM [facility] [address] [place], [place], [address] Phone: [phone]   Fax:  [phone]  Name: April Church MRN: [id number] Date of Birth: Oct 27, 1940

## 2016-05-10 ENCOUNTER — Encounter: Payer: Self-pay | Admitting: Diagnostic Neuroimaging

## 2016-05-10 ENCOUNTER — Ambulatory Visit (INDEPENDENT_AMBULATORY_CARE_PROVIDER_SITE_OTHER): Payer: Medicare Other | Admitting: Diagnostic Neuroimaging

## 2016-05-10 VITALS — BP 119/81 | HR 106 | Wt 131.8 lb

## 2016-05-10 DIAGNOSIS — R498 Other voice and resonance disorders: Secondary | ICD-10-CM | POA: Diagnosis not present

## 2016-05-10 DIAGNOSIS — M4712 Other spondylosis with myelopathy, cervical region: Secondary | ICD-10-CM

## 2016-05-10 DIAGNOSIS — G20 Parkinson's disease: Secondary | ICD-10-CM

## 2016-05-10 DIAGNOSIS — G250 Essential tremor: Secondary | ICD-10-CM | POA: Diagnosis not present

## 2016-05-10 DIAGNOSIS — M4722 Other spondylosis with radiculopathy, cervical region: Secondary | ICD-10-CM

## 2016-05-10 MED ORDER — CYCLOBENZAPRINE HCL 5 MG PO TABS: 5.0000 mg | ORAL_TABLET | Freq: Every day | ORAL | 4 refills | Status: DC

## 2016-05-10 MED ORDER — PRIMIDONE 50 MG PO TABS: 100.0000 mg | ORAL_TABLET | Freq: Three times a day (TID) | ORAL | 4 refills | Status: DC

## 2016-05-10 MED ORDER — CARBIDOPA-LEVODOPA 25-100 MG PO TABS: 1.0000 | ORAL_TABLET | Freq: Three times a day (TID) | ORAL | 4 refills | Status: DC

## 2016-05-10 NOTE — Progress Notes (Signed)
[facility]  PATIENT: April Church DOB: 11/14/1940  REFERRING CLINICIAN:  HISTORY FROM: patient REASON FOR VISIT: follow up   Chief Complaint  Patient presents with  . Parkinsonism    rm 7, "tremors are probably the same; will begin new round of OT"  . Follow-up    6 month    HISTORICAL  CHIEF COMPLAINT:  Chief Complaint  Patient presents with  . Parkinsonism    rm 7, "tremors are probably the same; will begin new round of OT"  . Follow-up    6 month    HISTORY OF PRESENT ILLNESS:   UPDATE 05/10/16: Since last visit, stable. Some mild tremors in left hand. Mild balance issues. Mild memory issues. Cyclobenz helps with night time spasms.   UPDATE 11/12/15: Since last visit, tremors are stable. Balance stable. Now with night time neck, shoulder, spine and leg pain (spasm). Had PT and OT evals as well.   UPDATE 05/13/15: Since last visit, tremor in arms better. Had ST and botox for voice issues. Some wearing off of meds before mid-day dosing. No neck pain issues.   UPDATE 02/12/15: Since last visit, tremor is stable. More voice strangulation issues. Tolerating primidone 100mg  TID.  UPDATE 08/13/14: Since last visit, tried primidone 250mg  BID, but not much tremor benefit; also having more sleepiness in the day time. Still with hand > voice > chin tremor. No falls. Balance getting slightly worse.  UPDATE 05/22/14: Since last visit, tremor progressing. Now notes new resting tremor in the last year. Some balance and walking issues. She is s/p cervical decompression in Sept 2015, with good results.   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 Her tremor does not limit any of her activities of daily life. She denies any side effects of the [place]. She also reports today that she's had some numbness and tingling down her right arm from her neck and shoulder. It has been bothering her more  in the last 6 months. 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 She saw Dr. Arnoldo Morale back in 2004. She returns for reevaluation  UPDATE 06/07/12 (VRP): Doing well. BUE tremor is stable and controlled. Voice tremor is slightly progressing. Tolerating primidone.  PRIOR HPI (06/09/11, VRP):  76 yrs old female returns for F/U. Last seen 06/09/10.  She has been followed in this office for many years for essential tremor. She takes primidone 50 mg, 1.5 TID. She tolerates it well. She comes in today saying that her tremor is a little worse before lunch.  The tremor limits her a little bit with eating and writing but for the most part she is happy with her level of function.She has hx of breast cancer.  No neurologic complaints.    REVIEW OF SYSTEMS: Full 14 system review of systems performed and negative except: food allergies tremors aching muscles muscle cramps.   ALLERGIES: Allergies  Allergen Reactions  . Shellfish Allergy Shortness Of Breath  . Duloxetine Itching and Rash  . Sulfa Drugs Cross Reactors Anxiety and Rash  . Codeine Anxiety    HOME MEDICATIONS: Outpatient Medications Prior to Visit  Medication Sig Dispense Refill  . albuterol (PROVENTIL HFA;VENTOLIN HFA) 108 (90 BASE) MCG/ACT inhaler Inhale 1 puff into the lungs every 6 (six) hours as needed for wheezing or shortness of breath.    [name] amLODipine (NORVASC) 5 MG tablet Take 1 tablet (5  mg total) by mouth 2 (two) times daily.    [name] aspirin 81 MG tablet Take 81 mg by mouth daily.      [name] atorvastatin (LIPITOR) 20 MG tablet Take 20 mg by mouth daily.    . beclomethasone (QVAR) 80 MCG/ACT inhaler Inhale 1 puff into the lungs 2 (two) times daily.     [name] CALCIUM CARBONATE PO Take 1 tablet by mouth 2 (two) times daily.    . carbidopa-levodopa (SINEMET IR) 25-100 MG tablet Take 1 tablet by mouth 3 (three) times daily before meals. 270 tablet 4  . Cholecalciferol (VITAMIN D) 1000 UNITS  capsule Take 1,000 Units by mouth 2 (two) times daily.     . Coenzyme Q10 (CO Q 10) 100 MG CAPS Take by mouth.    . cyclobenzaprine (FLEXERIL) 5 MG tablet TAKE ONE TABLET BY MOUTH AT BEDTIME AS NEEDED FOR MUSCLE SPASM 90 tablet 0  . EPINEPHrine 0.3 mg/0.3 mL IJ SOAJ injection as needed.    . fexofenadine (ALLEGRA) 180 MG tablet Take 180 mg by mouth daily.    . fluticasone (FLONASE) 50 MCG/ACT nasal spray Place 1 spray into the nose 2 (two) times daily.     [name] ipratropium (ATROVENT) 0.03 % nasal spray Place 2 sprays into both nostrils 2 (two) times daily.    . Omega-3 Fatty Acids (FISH OIL) 1200 MG CAPS Take 1,200 mg by mouth 2 (two) times daily.     Vladimir Faster Glycol-Propyl Glycol (SYSTANE OP) Place 1 drop into both eyes 2 (two) times daily.    . primidone (MYSOLINE) 50 MG tablet Take 2 tablets (100 mg total) by mouth 3 (three) times daily. 540 tablet 4  . ranitidine (ZANTAC) 150 MG capsule Take 150 mg by mouth daily after breakfast.     . triamcinolone cream (KENALOG) 0.1 % Apply 1 application topically 2 (two) times daily as needed (FOR RASH).     [name] venlafaxine (EFFEXOR) 37.5 MG tablet Take 1 tablet (37.5 mg total) by mouth daily. 90 tablet 3   No facility-administered medications prior to visit.     PAST MEDICAL HISTORY: Past Medical History:  Diagnosis Date  . Asthma    daily inhaler  . Breast cancer ([place])    left- radiation and surgery -dx. 2011- no further tx. now- [name] , [name]  . Cyst of finger 11/2011   annular cyst right long finger  . Dental crowns present   . Dermatitis   . Frequency of urination   . GERD (gastroesophageal reflux disease)   . Hemorrhoid   . Hyperlipidemia   . Hypertension    under control, has been on med. x 2 yrs.  [name] PONV (postoperative nausea and vomiting)   . Seasonal allergies   . Tremors of nervous system    hands  . Trigger finger of right hand 11/2011   long finger    PAST SURGICAL HISTORY: Past Surgical History:  Procedure  Laterality Date  . ANTERIOR CERVICAL DECOMPRESSION/DISCECTOMY FUSION 4 LEVELS N/A 11/14/2013   Procedure: ANTERIOR CERVICAL DECOMPRESSION/DISCECTOMY FUSION 4 LEVELS;  Surgeon: Newman Pies, MD;  Location: [facility];  Service: Neurosurgery;  Laterality: N/A;  C34 C45 C56 C67 anterior cervical fusion with interbody prosthesis plating and  bonegraft  . APPENDECTOMY  age 19  . BREAST LUMPECTOMY  06/16/2009   left; SLN bx.  [name] BREAST LUMPECTOMY  07/01/2009   re-excision  . BREAST SURGERY  1999   reduction  . CATARACT EXTRACTION, BILATERAL    .  COLONOSCOPY WITH PROPOFOL N/A 12/03/2014   Procedure: COLONOSCOPY WITH PROPOFOL;  Surgeon: [name], MD;  [facility];  Service: Endoscopy;  Laterality: N/A;  . FOOT SURGERY  06/22/11   left  . KNEE ARTHROSCOPY  03/12/2005   right  . KNEE ARTHROSCOPY     left  . NASAL SINUS SURGERY     x 2  . 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 TRIGGER FINGER RELEASE  12/16/2011   Procedure: RELEASE TRIGGER FINGER/A-1 PULLEY;  Surgeon: Tennis Must, MD;  Location: [facility];  Service: Orthopedics;  Laterality: Right;  RIGHT LONG FINGER TRIGGER RELEASE & ANNULAR CYST EXCISION  . TUMOR EXCISION  age 36   right arm    FAMILY HISTORY: Family History  Problem Relation Age of Onset  . Kidney disease Mother   . Stroke Father   . Stroke Sister     08/2015  . Diabetes Brother   . Heart disease Brother   . Cancer Sister     SOCIAL HISTORY:  Social History   Social History  . Marital status: Married    Spouse name: Marcello Moores   . Number of children: 1  . Years of education: HS   Occupational History  . Retired    Social History Main Topics  . Smoking status: Never Smoker  . Smokeless tobacco: Never Used  . Alcohol use No  . Drug use: No  . Sexual activity: Yes   Other Topics Concern  . Not on file   Social History Narrative      Pt lives at home with her spouse.  [name]     Patient has 1 child.    Patient is retired.    Patient has a HS education.    Patient is right handed.    Patient drinks caffeine occasionally.               PHYSICAL EXAM  Vitals:   05/10/16 1419  BP: 119/81  Pulse: (!) 106  Weight: 131 lb 12.8 oz (59.8 kg)   Body mass index is 24.9 kg/m.  GENERAL EXAM: Patient is in no distress  CARDIOVASCULAR: Regular rate and rhythm, no murmurs, no carotid bruits  NEUROLOGIC: MENTAL STATUS: awake, alert, language fluent, comprehension intact, naming intact; MASKED FACIES; NEG FRONTAL RELEASE SIGNS CRANIAL NERVE: pupils equal and reactive to light, visual fields full to confrontation, extraocular muscles intact, no nystagmus, facial sensation and strength symmetric, uvula midline, shoulder shrug symmetric, tongue midline; SOFT HOARSE VOICE MOTOR: normal bulk, POSTURAL AND ACTION TREMOR; INT REST TREMOR IN LUE WITH CONTRALATERAL RAM; BRADYKINESIA IN BUE and BLE; NO RIGIDITY; full strength in the BUE, BLE; INTERMITTENT MOUTH TREMOR SENSORY: normal and symmetric to light touch COORDINATION: finger-nose-finger, fine finger movements normal REFLEXES: deep tendon reflexes present and symmetric GAIT/STATION: narrow based gait; SLOW AND CAUTIOUS, DECR ARM SWING    DIAGNOSTIC DATA (LABS, IMAGING, TESTING) - I reviewed patient records, labs, notes, testing and imaging myself where available.  Lab Results  Component Value Date   WBC 5.2 01/20/2015   HGB 12.0 01/20/2015   HCT 36.1 01/20/2015   MCV 88.4 01/20/2015   PLT 293 01/20/2015      Component Value Date/Time   NA 136 01/20/2015 1326   K 4.1 01/20/2015 1326   CL 100 11/06/2013 1032   CL 102 06/30/2012 1231   CO2 27 01/20/2015 1326   GLUCOSE 66 (L) 01/20/2015 1326   GLUCOSE 77  06/30/2012 1231   BUN 13.9 01/20/2015 1326   CREATININE 0.9 01/20/2015 1326   CALCIUM 8.8 01/20/2015 1326   PROT 7.4 01/20/2015 1326   ALBUMIN 3.6 01/20/2015 1326   AST 17 01/20/2015 1326   ALT 13  01/20/2015 1326   ALKPHOS 86 01/20/2015 1326   BILITOT <0.30 01/20/2015 1326   GFRNONAA 83 (L) 11/06/2013 1032   GFRAA >90 11/06/2013 1032   No results found for: CHOL No results found for: HGBA1C No results found for: VITAMINB12 No results found for: TSH   MRI brain - unremarkable per patient (done ~ early 2000's)  06/25/13 EMG/NCS 1. 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 2. No evidence of widespread underlying large fiber neuropathy.  11/14/13 CXR [I reviewed images myself and agree with interpretation. -VRP]  - Anterior cervical disc fusion localization images as described.    ASSESSMENT AND PLAN  76 y.o. year old female  has a past medical history of Asthma; Breast cancer ([place]); Cyst of finger (11/2011); Dental crowns present; Dermatitis; Frequency of urination; GERD (gastroesophageal reflux disease); Hemorrhoid; Hyperlipidemia; Hypertension; PONV (postoperative nausea and vomiting); Seasonal allergies; Tremors of nervous system; and Trigger finger of right hand (11/2011). here with essential tremor since 1990's. Now with intermittent resting tremor AND bradykinesia since 2016. May represent essential tremor with superimposed (parkinson's disease vs cervical myelopathy sequelae).   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)  Parkinsonism, unspecified Parkinsonism type ([place]) - Plan: AMB referral to nuclear medicine  Cervical spondylosis with myelopathy and radiculopathy  Voice tremor  Essential tremor     PLAN: - [facility] (to confirm superimposed parkinsonism on top of essential tremor and cervical myelopathy) - continue cyclobenzaprine 5mg  at bedtime for nocturnal muscle spasms (? 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 AMB referral to nuclear medicine   Meds ordered this encounter  Medications  . carbidopa-levodopa (SINEMET IR) 25-100 MG tablet    Sig: Take 1 tablet by mouth 3 (three) times daily before meals.    Dispense:  270 tablet    Refill:  4  . cyclobenzaprine (FLEXERIL) 5 MG tablet    Sig: Take 1 tablet (5 mg total) by mouth at bedtime.    Dispense:  90 tablet    Refill:  4  . primidone (MYSOLINE) 50 MG tablet    Sig: Take 2 tablets (100 mg total) by mouth 3 (three) times daily.    Dispense:  540 tablet    Refill:  4   Return in about 6 months (around 11/10/2016).     Penni Bombard, MD 4/70/9628, 3:66 PM Certified in Neurology, Neurophysiology and Neuroimaging  [facility] [address], [place], [place] [address] [phone]

## 2016-05-11 DIAGNOSIS — G20 Parkinson's disease: Secondary | ICD-10-CM | POA: Diagnosis not present

## 2016-05-11 DIAGNOSIS — F329 Major depressive disorder, single episode, unspecified: Secondary | ICD-10-CM | POA: Diagnosis not present

## 2016-05-11 DIAGNOSIS — Z1389 Encounter for screening for other disorder: Secondary | ICD-10-CM | POA: Diagnosis not present

## 2016-05-11 DIAGNOSIS — C50512 Malignant neoplasm of lower-outer quadrant of left female breast: Secondary | ICD-10-CM | POA: Diagnosis not present

## 2016-05-11 DIAGNOSIS — B0229 Other postherpetic nervous system involvement: Secondary | ICD-10-CM | POA: Diagnosis not present

## 2016-05-11 DIAGNOSIS — J45909 Unspecified asthma, uncomplicated: Secondary | ICD-10-CM | POA: Diagnosis not present

## 2016-05-11 DIAGNOSIS — E785 Hyperlipidemia, unspecified: Secondary | ICD-10-CM | POA: Diagnosis not present

## 2016-05-11 DIAGNOSIS — Z1231 Encounter for screening mammogram for malignant neoplasm of breast: Secondary | ICD-10-CM | POA: Diagnosis not present

## 2016-05-11 DIAGNOSIS — Z0000 Encounter for general adult medical examination without abnormal findings: Secondary | ICD-10-CM | POA: Diagnosis not present

## 2016-05-11 DIAGNOSIS — I10 Essential (primary) hypertension: Secondary | ICD-10-CM | POA: Diagnosis not present

## 2016-05-12 ENCOUNTER — Ambulatory Visit: Payer: Medicare Other | Admitting: Occupational Therapy

## 2016-05-12 DIAGNOSIS — R29898 Other symptoms and signs involving the musculoskeletal system: Secondary | ICD-10-CM

## 2016-05-12 DIAGNOSIS — R490 Dysphonia: Secondary | ICD-10-CM | POA: Diagnosis not present

## 2016-05-12 DIAGNOSIS — R471 Dysarthria and anarthria: Secondary | ICD-10-CM | POA: Diagnosis not present

## 2016-05-12 DIAGNOSIS — R29818 Other symptoms and signs involving the nervous system: Secondary | ICD-10-CM

## 2016-05-12 DIAGNOSIS — R2689 Other abnormalities of gait and mobility: Secondary | ICD-10-CM

## 2016-05-12 DIAGNOSIS — R293 Abnormal posture: Secondary | ICD-10-CM | POA: Diagnosis not present

## 2016-05-12 DIAGNOSIS — R1312 Dysphagia, oropharyngeal phase: Secondary | ICD-10-CM | POA: Diagnosis not present

## 2016-05-12 DIAGNOSIS — R278 Other lack of coordination: Secondary | ICD-10-CM

## 2016-05-12 NOTE — Patient Instructions (Signed)
Coordination Exercises  Perform the following exercises for 20 minutes 1 times per day. Perform with both hand(s). Perform using big movements.   Flipping Cards: Place deck of cards on the table. Flip cards over by opening your hand big to grasp and then turn your palm up big.  Deal cards: Hold 1/2 or whole deck in your hand. Use thumb to push card off top of deck with one big push.  Rotate ball with fingertips: Pick up with fingers/thumb and move as much as you can with each turn/movement (clockwise and counter-clockwise).  Toss ball from one hand to the other: Toss big/high.  Toss ball in the air and catch with the same hand: Toss big/high.  Pick up coins and place in coin bank or container: Pick up with big, intentional movements. Do not drag coin to the edge.  Pick up 5-10 coins one at a time and hold in palm. Then, move coins from palm to fingertips one at time and place in coin bank/container.  Practice writing: Slow down, write big, and focus on forming each letter.  Perform "Flicks"/hand stretches (PWR! Hands): Close hands then flick out your fingers with focus on opening hands, pulling wrists back, and extending elbows like you are pushing.

## 2016-05-12 NOTE — Therapy (Signed)
[facility] [address] [place], [place], [address] Phone: [phone]   Fax:  [phone]  Occupational Therapy Treatment  Patient Details  Name: April Church MRN: [id number] Date of Birth: May 27, 1940 Referring Provider: Dr. Leta Baptist  Encounter Date: 05/12/2016      OT End of Session - 05/12/16 1638    Visit Number 2   Number of Visits 13   Date for OT Re-Evaluation 07/04/16   Authorization Type Medicare   Authorization Time Period 32 days(05/06/16-07/04/16)   Authorization - Visit Number 2   Authorization - Number of Visits 10   OT Start Time 1537   OT Stop Time 1620   OT Time Calculation (min) 43 min   Activity Tolerance Patient tolerated treatment well   Behavior During Therapy [facility] for tasks assessed/performed      Past Medical History:  Diagnosis Date  . Asthma    daily inhaler  . Breast cancer ([place])    left- radiation and surgery -dx. 2011- no further tx. now- [name] , [name]  . Cyst of finger 11/2011   annular cyst right long finger  . Dental crowns present   . Dermatitis   . Frequency of urination   . GERD (gastroesophageal reflux disease)   . Hemorrhoid   . Hyperlipidemia   . Hypertension    under control, has been on med. x 2 yrs.  [name] PONV (postoperative nausea and vomiting)   . Seasonal allergies   . Tremors of nervous system    hands  . Trigger finger of right hand 11/2011   long finger    Past Surgical History:  Procedure Laterality Date  . ANTERIOR CERVICAL DECOMPRESSION/DISCECTOMY FUSION 4 LEVELS N/A 11/14/2013   Procedure: ANTERIOR CERVICAL DECOMPRESSION/DISCECTOMY FUSION 4 LEVELS;  Surgeon: Newman Pies, MD;  Location: [facility];  Service: Neurosurgery;  Laterality: N/A;  C34 C45 C56 C67 anterior cervical fusion with interbody prosthesis plating and  bonegraft  . APPENDECTOMY  age 53  . BREAST LUMPECTOMY  06/16/2009   left; SLN bx.  [name] BREAST LUMPECTOMY   07/01/2009   re-excision  . BREAST SURGERY  1999   reduction  . CATARACT EXTRACTION, BILATERAL    . COLONOSCOPY WITH PROPOFOL N/A 12/03/2014   Procedure: COLONOSCOPY WITH PROPOFOL;  Surgeon: [name], MD;  Location: [facility];  Service: Endoscopy;  Laterality: N/A;  . FOOT SURGERY  06/22/11   left  . KNEE ARTHROSCOPY  03/12/2005   right  . KNEE ARTHROSCOPY     left  . NASAL SINUS SURGERY     x 2  . 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 TRIGGER FINGER RELEASE  12/16/2011   Procedure: RELEASE TRIGGER FINGER/A-1 PULLEY;  Surgeon: Tennis Must, MD;  Location: [facility];  Service: Orthopedics;  Laterality: Right;  RIGHT LONG FINGER TRIGGER RELEASE & ANNULAR CYST EXCISION  . TUMOR EXCISION  age 49   right arm    There were no vitals filed for this visit.      Subjective Assessment - 05/12/16 1537    Subjective  Denies pain   Pertinent History see Epic, PD, essential tremor, hx of breast CA, hx of cervical decompression   Patient Stated Goals improve coordination for ADLs   Currently in Pain? No/denies  OT Education - 05/12/16 1635    Education provided Yes   Education Details coordination HEP, and adapted strategies for eating, handwriting strategies and practice with improved legibility,  practice opening containers   Person(s) Educated Patient   Methods Explanation;Demonstration;Handout   Comprehension Verbalized understanding;Returned demonstration;Verbal cues required          OT Short Term Goals - 05/06/16 1137      OT SHORT TERM GOAL #1   Title Pt will be independent with HEP--check 06/06/16   Time 4   Period Weeks   Status New     OT SHORT TERM GOAL #2   Title Pt will verbalize understanding of ways to prevent future complications and verbalize understanding of community resources.   Time 4   Period Weeks   Status New           OT Long Term  Goals - 05/06/16 1139      OT LONG TERM GOAL #1   Title Pt will verbalize understanding of strategies/AE to incr ease with ADLs (including eating, writing, buttoning).   Time 8   Period Weeks   Status New     OT LONG TERM GOAL #2   Title Pt will improve coordination for ADLs (fastening jewerly) as shown by improving time on 9-hole peg test by at least 5sec with R hand.   Baseline 41.75 secs   Time 8   Period Weeks   Status New     OT LONG TERM GOAL #3   Title Pt will improve coordination/functional reaching for ADLs as shown by improving score on box and blocks with LUE by at least 4 blocks   Baseline LUE 38 blocks   Time 8   Period Weeks   Status New     OT LONG TERM GOAL #4   Title Pt will button/unbutton 3 buttons on tabletop in 33 sec or less.   Baseline 38.22   Time 8   Period Weeks   Status New     OT LONG TERM GOAL #5   Title Pt will improve time on PPT#2 (simulated eating) to 20 sec or less using AE/strategies prn.   Baseline 27.53 secs with 1 drop   Time 8   Period Weeks   Status New               Plan - 05/12/16 1554    Clinical Impression Statement Pt is progressing towards goals. She demonstrates understanding of HEP and understanding of beginning strategies for ADLS.   Rehab Potential Good   OT Frequency --  12 visits   OT Duration 8 weeks   OT Treatment/Interventions Self-care/ADL training;Moist Heat;Fluidtherapy;DME and/or AE instruction;Patient/family education;Balance training;Therapeutic exercises;Ultrasound;Therapeutic exercise;Therapeutic activities;Passive range of motion;Cognitive remediation/compensation;Energy conservation;Manual Therapy;Cryotherapy;Neuromuscular education;Functional Mobility Training   Plan adapted strategies for ADLs, PWR! exercises   Consulted and Agree with Plan of Care Patient      Patient will benefit from skilled therapeutic intervention in order to improve the following deficits and impairments:  Abnormal gait,  Decreased knowledge of use of DME, Impaired flexibility, Decreased coordination, Decreased endurance, Decreased range of motion, Decreased strength, Impaired tone, Decreased balance, Decreased knowledge of precautions, Decreased safety awareness, Impaired perceived functional ability, Impaired UE functional use  Visit Diagnosis: Other lack of coordination  Abnormal posture  Other symptoms and signs involving the musculoskeletal system  Other symptoms and signs involving the nervous system  Other abnormalities of gait and mobility    Problem List Patient Active Problem List  Diagnosis Date Noted  . Cervical spondylosis with myelopathy and radiculopathy 11/14/2013  . Essential tremor 06/13/2013  . Cervical spondylosis without myelopathy 06/13/2013  . Breast cancer of lower-outer quadrant of left female breast ([place]) 09/10/2010    [name],[name] 05/12/2016, 4:40 PM Theone Murdoch, OTR/L [phone] Phone: [phone] 4:41 PM 05/12/16 [facility] [address] [place], [place], [address] Phone: [phone]   Fax:  [phone]  Name: SHYNA DUIGNAN MRN: [id number] Date of Birth: [birth year]

## 2016-05-14 ENCOUNTER — Ambulatory Visit: Payer: Medicare Other | Admitting: Occupational Therapy

## 2016-05-14 DIAGNOSIS — R1312 Dysphagia, oropharyngeal phase: Secondary | ICD-10-CM | POA: Diagnosis not present

## 2016-05-14 DIAGNOSIS — R490 Dysphonia: Secondary | ICD-10-CM | POA: Diagnosis not present

## 2016-05-14 DIAGNOSIS — R278 Other lack of coordination: Secondary | ICD-10-CM

## 2016-05-14 DIAGNOSIS — R293 Abnormal posture: Secondary | ICD-10-CM | POA: Diagnosis not present

## 2016-05-14 DIAGNOSIS — R29898 Other symptoms and signs involving the musculoskeletal system: Secondary | ICD-10-CM

## 2016-05-14 DIAGNOSIS — R29818 Other symptoms and signs involving the nervous system: Secondary | ICD-10-CM

## 2016-05-14 DIAGNOSIS — R471 Dysarthria and anarthria: Secondary | ICD-10-CM | POA: Diagnosis not present

## 2016-05-14 NOTE — Therapy (Signed)
[facility] [address] [place], [place], [address] Phone: [phone]   Fax:  [phone]  Occupational Therapy Treatment  Patient Details  Name: April Church MRN: [id number] Date of Birth: 1940-09-30 Referring Provider: Dr. Leta Baptist  Encounter Date: 05/14/2016      OT End of Session - 05/14/16 1749    Visit Number 3   Number of Visits 13   Date for OT Re-Evaluation 07/04/16   Authorization Type Medicare   Authorization Time Period 79 days(05/06/16-07/04/16)   Authorization - Visit Number 3   Authorization - Number of Visits 10   OT Start Time 1020   OT Stop Time 1100   OT Time Calculation (min) 40 min      Past Medical History:  Diagnosis Date  . Asthma    daily inhaler  . Breast cancer ([place])    left- radiation and surgery -dx. 2011- no further tx. now- [name] , [name]  . Cyst of finger 11/2011   annular cyst right long finger  . Dental crowns present   . Dermatitis   . Frequency of urination   . GERD (gastroesophageal reflux disease)   . Hemorrhoid   . Hyperlipidemia   . Hypertension    under control, has been on med. x 2 yrs.  [name] PONV (postoperative nausea and vomiting)   . Seasonal allergies   . Tremors of nervous system    hands  . Trigger finger of right hand 11/2011   long finger    Past Surgical History:  Procedure Laterality Date  . ANTERIOR CERVICAL DECOMPRESSION/DISCECTOMY FUSION 4 LEVELS N/A 11/14/2013   Procedure: ANTERIOR CERVICAL DECOMPRESSION/DISCECTOMY FUSION 4 LEVELS;  Surgeon: Newman Pies, MD;  Location: [facility];  Service: Neurosurgery;  Laterality: N/A;  C34 C45 C56 C67 anterior cervical fusion with interbody prosthesis plating and  bonegraft  . APPENDECTOMY  age 83  . BREAST LUMPECTOMY  06/16/2009   left; SLN bx.  [name] BREAST LUMPECTOMY  07/01/2009   re-excision  . BREAST SURGERY  1999   reduction  . CATARACT EXTRACTION, BILATERAL    . COLONOSCOPY WITH  PROPOFOL N/A 12/03/2014   Procedure: COLONOSCOPY WITH PROPOFOL;  Surgeon: [name], MD;  [facility];  Service: Endoscopy;  Laterality: N/A;  . FOOT SURGERY  06/22/11   left  . KNEE ARTHROSCOPY  03/12/2005   right  . KNEE ARTHROSCOPY     left  . NASAL SINUS SURGERY     x 2  . 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 TRIGGER FINGER RELEASE  12/16/2011   Procedure: RELEASE TRIGGER FINGER/A-1 PULLEY;  Surgeon: Tennis Must, MD;  Location: [facility];  Service: Orthopedics;  Laterality: Right;  RIGHT LONG FINGER TRIGGER RELEASE & ANNULAR CYST EXCISION  . TUMOR EXCISION  age 11   right arm    There were no vitals filed for this visit.      Subjective Assessment - 05/14/16 1753    Subjective  Denies pain   Pertinent History see Epic, PD, essential tremor, hx of breast CA, hx of cervical decompression   Patient Stated Goals improve coordination for ADLs   Currently in Pain? No/denies              Arm bike x 6 mins level 1, pt maintained 40 rpm. Fine motor coordination task, with cognitive component to copy small peg design with bilateral UE's, mod/ max difficulty/ v.c.  For RUE, min difficulty for design and LUE performance.                 OT Education - 05/14/16 1745    Education provided Yes   Education Details PWR! modified quadraped basic 4 x 10-20 reps each   Person(s) Educated Patient   Methods Explanation;Demonstration;Verbal cues;Handout   Comprehension Verbalized understanding;Returned demonstration;Verbal cues required          OT Short Term Goals - 05/06/16 1137      OT SHORT TERM GOAL #1   Title Pt will be independent with HEP--check 06/06/16   Time 4   Period Weeks   Status New     OT SHORT TERM GOAL #2   Title Pt will verbalize understanding of ways to prevent future complications and verbalize understanding of community resources.   Time 4   Period Weeks   Status New            OT Long Term Goals - 05/06/16 1139      OT LONG TERM GOAL #1   Title Pt will verbalize understanding of strategies/AE to incr ease with ADLs (including eating, writing, buttoning).   Time 8   Period Weeks   Status New     OT LONG TERM GOAL #2   Title Pt will improve coordination for ADLs (fastening jewerly) as shown by improving time on 9-hole peg test by at least 5sec with R hand.   Baseline 41.75 secs   Time 8   Period Weeks   Status New     OT LONG TERM GOAL #3   Title Pt will improve coordination/functional reaching for ADLs as shown by improving score on box and blocks with LUE by at least 4 blocks   Baseline LUE 38 blocks   Time 8   Period Weeks   Status New     OT LONG TERM GOAL #4   Title Pt will button/unbutton 3 buttons on tabletop in 33 sec or less.   Baseline 38.22   Time 8   Period Weeks   Status New     OT LONG TERM GOAL #5   Title Pt will improve time on PPT#2 (simulated eating) to 20 sec or less using AE/strategies prn.   Baseline 27.53 secs with 1 drop   Time 8   Period Weeks   Status New               Plan - 05/14/16 1748    Clinical Impression Statement Pt is progressing towards goals. She demonstrates understanding of PWR! modified quadraped.   Rehab Potential Good   OT Frequency --  12 visits   OT Duration 8 weeks   OT Treatment/Interventions Self-care/ADL training;Moist Heat;Fluidtherapy;DME and/or AE instruction;Patient/family education;Balance training;Therapeutic exercises;Ultrasound;Therapeutic exercise;Therapeutic activities;Passive range of motion;Cognitive remediation/compensation;Energy conservation;Manual Therapy;Cryotherapy;Neuromuscular education;Functional Mobility Training   Plan exercise flowsheet, review previously issued PWR!   Consulted and Agree with Plan of Care Patient      Patient will benefit from skilled therapeutic intervention in order to improve the following deficits and impairments:  Abnormal  gait, Decreased knowledge of use of DME, Impaired flexibility, Decreased coordination, Decreased endurance, Decreased range of motion, Decreased strength, Impaired tone, Decreased balance, Decreased knowledge of precautions, Decreased safety awareness, Impaired perceived functional ability, Impaired UE functional use  Visit Diagnosis: Other lack of coordination  Abnormal posture  Other symptoms and signs involving the musculoskeletal system  Other symptoms and signs involving the nervous system    Problem List  Patient Active Problem List   Diagnosis Date Noted  . Cervical spondylosis with myelopathy and radiculopathy 11/14/2013  . Essential tremor 06/13/2013  . Cervical spondylosis without myelopathy 06/13/2013  . Breast cancer of lower-outer quadrant of left female breast ([place]) 09/10/2010    [name],[name] 05/14/2016, 5:54 PM  [facility] [address] [place], [place], [address] Phone: [phone]   Fax:  [phone]  Name: April Church MRN: [id number] Date of Birth: 04/08/1940

## 2016-05-17 ENCOUNTER — Ambulatory Visit: Payer: Medicare Other | Admitting: Occupational Therapy

## 2016-05-17 DIAGNOSIS — R29898 Other symptoms and signs involving the musculoskeletal system: Secondary | ICD-10-CM

## 2016-05-17 DIAGNOSIS — R471 Dysarthria and anarthria: Secondary | ICD-10-CM | POA: Diagnosis not present

## 2016-05-17 DIAGNOSIS — R1312 Dysphagia, oropharyngeal phase: Secondary | ICD-10-CM | POA: Diagnosis not present

## 2016-05-17 DIAGNOSIS — R293 Abnormal posture: Secondary | ICD-10-CM

## 2016-05-17 DIAGNOSIS — R29818 Other symptoms and signs involving the nervous system: Secondary | ICD-10-CM

## 2016-05-17 DIAGNOSIS — R278 Other lack of coordination: Secondary | ICD-10-CM | POA: Diagnosis not present

## 2016-05-17 DIAGNOSIS — R490 Dysphonia: Secondary | ICD-10-CM | POA: Diagnosis not present

## 2016-05-17 NOTE — Therapy (Signed)
[facility] [address] [place], [place], [address] Phone: [phone]   Fax:  [phone]  Occupational Therapy Treatment  Patient Details  Name: April Church MRN: [id number] Date of Birth: 1940/09/04 Referring Provider: Dr. Leta Baptist  Encounter Date: 05/17/2016      OT End of Session - 05/17/16 1254    Visit Number 4   Number of Visits 13   Date for OT Re-Evaluation 07/04/16   Authorization Type Medicare   Authorization Time Period 43 days(05/06/16-07/04/16)   Authorization - Visit Number 4   Authorization - Number of Visits 10   OT Start Time 0938   OT Stop Time 1232   OT Time Calculation (min) 40 min   Activity Tolerance Patient tolerated treatment well   Behavior During Therapy [facility] for tasks assessed/performed      Past Medical History:  Diagnosis Date  . Asthma    daily inhaler  . Breast cancer ([place])    left- radiation and surgery -dx. 2011- no further tx. now- [name] , [name]  . Cyst of finger 11/2011   annular cyst right long finger  . Dental crowns present   . Dermatitis   . Frequency of urination   . GERD (gastroesophageal reflux disease)   . Hemorrhoid   . Hyperlipidemia   . Hypertension    under control, has been on med. x 2 yrs.  [name] PONV (postoperative nausea and vomiting)   . Seasonal allergies   . Tremors of nervous system    hands  . Trigger finger of right hand 11/2011   long finger    Past Surgical History:  Procedure Laterality Date  . ANTERIOR CERVICAL DECOMPRESSION/DISCECTOMY FUSION 4 LEVELS N/A 11/14/2013   Procedure: ANTERIOR CERVICAL DECOMPRESSION/DISCECTOMY FUSION 4 LEVELS;  Surgeon: Newman Pies, MD;  Location: [facility];  Service: Neurosurgery;  Laterality: N/A;  C34 C45 C56 C67 anterior cervical fusion with interbody prosthesis plating and  bonegraft  . APPENDECTOMY  age 16  . BREAST LUMPECTOMY  06/16/2009   left; SLN bx.  [name] BREAST LUMPECTOMY   07/01/2009   re-excision  . BREAST SURGERY  1999   reduction  . CATARACT EXTRACTION, BILATERAL    . COLONOSCOPY WITH PROPOFOL N/A 12/03/2014   Procedure: COLONOSCOPY WITH PROPOFOL;  Surgeon: [name], MD;  [facility];  Service: Endoscopy;  Laterality: N/A;  . FOOT SURGERY  06/22/11   left  . KNEE ARTHROSCOPY  03/12/2005   right  . KNEE ARTHROSCOPY     left  . NASAL SINUS SURGERY     x 2  . 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 TRIGGER FINGER RELEASE  12/16/2011   Procedure: RELEASE TRIGGER FINGER/A-1 PULLEY;  Surgeon: Tennis Must, MD;  Location: [facility];  Service: Orthopedics;  Laterality: Right;  RIGHT LONG FINGER TRIGGER RELEASE & ANNULAR CYST EXCISION  . TUMOR EXCISION  age 71   right arm    There were no vitals filed for this visit.             Arm bike x 5 mins level 1 for conditioning, pt maintained 40 rpm               OT Education - 05/17/16 1250    Education provided Yes   Education Details PWR! basic 4 in prone 10-20 reps each, modified quadraped PWR! up, rock twist, standing basic 4, 10 reps each, plan  to issue flowsheet next visit, time di not allow today   Person(s) Educated Patient   Methods Explanation;Demonstration;Verbal cues;Handout   Comprehension Verbalized understanding;Returned demonstration;Verbal cues required  min-mod v.c          OT Short Term Goals - 05/06/16 1137      OT SHORT TERM GOAL #1   Title Pt will be independent with HEP--check 06/06/16   Time 4   Period Weeks   Status New     OT SHORT TERM GOAL #2   Title Pt will verbalize understanding of ways to prevent future complications and verbalize understanding of community resources.   Time 4   Period Weeks   Status New           OT Long Term Goals - 05/06/16 1139      OT LONG TERM GOAL #1   Title Pt will verbalize understanding of strategies/AE to incr ease with ADLs (including  eating, writing, buttoning).   Time 8   Period Weeks   Status New     OT LONG TERM GOAL #2   Title Pt will improve coordination for ADLs (fastening jewerly) as shown by improving time on 9-hole peg test by at least 5sec with R hand.   Baseline 41.75 secs   Time 8   Period Weeks   Status New     OT LONG TERM GOAL #3   Title Pt will improve coordination/functional reaching for ADLs as shown by improving score on box and blocks with LUE by at least 4 blocks   Baseline LUE 38 blocks   Time 8   Period Weeks   Status New     OT LONG TERM GOAL #4   Title Pt will button/unbutton 3 buttons on tabletop in 33 sec or less.   Baseline 38.22   Time 8   Period Weeks   Status New     OT LONG TERM GOAL #5   Title Pt will improve time on PPT#2 (simulated eating) to 20 sec or less using AE/strategies prn.   Baseline 27.53 secs with 1 drop   Time 8   Period Weeks   Status New               Plan - 05/17/16 1252    Clinical Impression Statement Pt is progressing towards goals. She can benefit from review of standing PWR! exercises and flowsheet to organize her overall exercise program.   Rehab Potential Good   OT Frequency --  12 visits   OT Duration 8 weeks   OT Treatment/Interventions Self-care/ADL training;Moist Heat;Fluidtherapy;DME and/or AE instruction;Patient/family education;Balance training;Therapeutic exercises;Ultrasound;Therapeutic exercise;Therapeutic activities;Passive range of motion;Cognitive remediation/compensation;Energy conservation;Manual Therapy;Cryotherapy;Neuromuscular education;Functional Mobility Training   Plan issue exericise flowsheet, review PWR! standing   Consulted and Agree with Plan of Care Patient      Patient will benefit from skilled therapeutic intervention in order to improve the following deficits and impairments:  Abnormal gait, Decreased knowledge of use of DME, Impaired flexibility, Decreased coordination, Decreased endurance, Decreased range  of motion, Decreased strength, Impaired tone, Decreased balance, Decreased knowledge of precautions, Decreased safety awareness, Impaired perceived functional ability, Impaired UE functional use  Visit Diagnosis: Other lack of coordination  Abnormal posture  Other symptoms and signs involving the musculoskeletal system  Other symptoms and signs involving the nervous system    Problem List Patient Active Problem List   Diagnosis Date Noted  . Cervical spondylosis with myelopathy and radiculopathy 11/14/2013  . Essential tremor 06/13/2013  . Cervical  spondylosis without myelopathy 06/13/2013  . Breast cancer of lower-outer quadrant of left female breast ([place]) 09/10/2010    [name],[name] 05/17/2016, 12:56 PM  [facility] [address] [place], [place], [address] Phone: [phone]   Fax:  [phone]  Name: April Church MRN: [id number] Date of Birth: 1940/12/10

## 2016-05-17 NOTE — Patient Instructions (Signed)
(  Exercise) Monday Tuesday Wednesday Thursday Friday Saturday Sunday  PWR! On back          PWR! Lean over table          PWR standing            coordination           walking

## 2016-05-18 ENCOUNTER — Ambulatory Visit: Payer: Medicare Other | Admitting: Occupational Therapy

## 2016-05-18 ENCOUNTER — Encounter: Payer: Medicare Other | Admitting: Occupational Therapy

## 2016-05-19 ENCOUNTER — Telehealth: Payer: Self-pay | Admitting: Diagnostic Neuroimaging

## 2016-05-19 NOTE — Telephone Encounter (Signed)
Patient's insurance does not cover her DAT - Scan . Patient will have to pay ten - fourteen thousand. Patient and her husband will discuss and call me back.

## 2016-05-26 ENCOUNTER — Ambulatory Visit: Payer: Medicare Other | Attending: Diagnostic Neuroimaging | Admitting: Occupational Therapy

## 2016-05-26 DIAGNOSIS — R293 Abnormal posture: Secondary | ICD-10-CM | POA: Diagnosis not present

## 2016-05-26 DIAGNOSIS — R29818 Other symptoms and signs involving the nervous system: Secondary | ICD-10-CM | POA: Diagnosis not present

## 2016-05-26 DIAGNOSIS — R29898 Other symptoms and signs involving the musculoskeletal system: Secondary | ICD-10-CM | POA: Insufficient documentation

## 2016-05-26 DIAGNOSIS — R278 Other lack of coordination: Secondary | ICD-10-CM | POA: Insufficient documentation

## 2016-05-26 NOTE — Patient Instructions (Signed)
(  Exercise) Monday Tuesday Wednesday Thursday Friday Saturday Sunday   PWR! Lean over table           PWR! laying on stomach          PWR! stand         Balance ex            coordi- nation           walking

## 2016-05-27 NOTE — Therapy (Signed)
[facility] [address] [place], [place], [address] Phone: [phone]   Fax:  [phone]  Occupational Therapy Treatment  Patient Details  Name: April Church MRN: [id number] Date of Birth: May 20, 1940 Referring Provider: Dr. Leta Baptist  Encounter Date: 05/26/2016      OT End of Session - 05/27/16 0919    Visit Number 4   Number of Visits 13   Date for OT Re-Evaluation 07/04/16   Authorization Type Medicare   Authorization - Visit Number 5   Authorization - Number of Visits 10   OT Start Time 2595   OT Stop Time 1615   OT Time Calculation (min) 38 min   Activity Tolerance Patient tolerated treatment well   Behavior During Therapy [facility] for tasks assessed/performed      Past Medical History:  Diagnosis Date  . Asthma    daily inhaler  . Breast cancer ([place])    left- radiation and surgery -dx. 2011- no further tx. now- [name] , [name]  . Cyst of finger 11/2011   annular cyst right long finger  . Dental crowns present   . Dermatitis   . Frequency of urination   . GERD (gastroesophageal reflux disease)   . Hemorrhoid   . Hyperlipidemia   . Hypertension    under control, has been on med. x 2 yrs.  [name] PONV (postoperative nausea and vomiting)   . Seasonal allergies   . Tremors of nervous system    hands  . Trigger finger of right hand 11/2011   long finger    Past Surgical History:  Procedure Laterality Date  . ANTERIOR CERVICAL DECOMPRESSION/DISCECTOMY FUSION 4 LEVELS N/A 11/14/2013   Procedure: ANTERIOR CERVICAL DECOMPRESSION/DISCECTOMY FUSION 4 LEVELS;  Surgeon: Newman Pies, MD;  Location: [facility];  Service: Neurosurgery;  Laterality: N/A;  C34 C45 C56 C67 anterior cervical fusion with interbody prosthesis plating and  bonegraft  . APPENDECTOMY  age 38  . BREAST LUMPECTOMY  06/16/2009   left; SLN bx.  [name] BREAST LUMPECTOMY  07/01/2009   re-excision  . BREAST SURGERY  1999   reduction   . CATARACT EXTRACTION, BILATERAL    . COLONOSCOPY WITH PROPOFOL N/A 12/03/2014   Procedure: COLONOSCOPY WITH PROPOFOL;  Surgeon: [name], MD;  Location: [facility];  Service: Endoscopy;  Laterality: N/A;  . FOOT SURGERY  06/22/11   left  . KNEE ARTHROSCOPY  03/12/2005   right  . KNEE ARTHROSCOPY     left  . NASAL SINUS SURGERY     x 2  . 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 TRIGGER FINGER RELEASE  12/16/2011   Procedure: RELEASE TRIGGER FINGER/A-1 PULLEY;  Surgeon: Tennis Must, MD;  Location: [facility];  Service: Orthopedics;  Laterality: Right;  RIGHT LONG FINGER TRIGGER RELEASE & ANNULAR CYST EXCISION  . TUMOR EXCISION  age 62   right arm    There were no vitals filed for this visit.      Subjective Assessment - 05/27/16 0922    Subjective  Denies pain   Pertinent History see Epic, PD, essential tremor, hx of breast CA, hx of cervical decompression   Patient Stated Goals improve coordination for ADLs   Currently in Pain? No/denies                              OT Education - 05/27/16  67    Education provided Yes   Education Details reviewed PWR! exercises in prone, modified quadraped, and standing 10 reps each, min v.c., use of exercise flowsheet   Person(s) Educated Patient   Methods Explanation;Demonstration;Verbal cues;Handout   Comprehension Returned demonstration;Verbal cues required;Verbalized understanding          OT Short Term Goals - 05/06/16 1137      OT SHORT TERM GOAL #1   Title Pt will be independent with HEP--check 06/06/16   Time 4   Period Weeks   Status New     OT SHORT TERM GOAL #2   Title Pt will verbalize understanding of ways to prevent future complications and verbalize understanding of community resources.   Time 4   Period Weeks   Status New           OT Long Term Goals - 05/06/16 1139      OT LONG TERM GOAL #1   Title Pt will verbalize  understanding of strategies/AE to incr ease with ADLs (including eating, writing, buttoning).   Time 8   Period Weeks   Status New     OT LONG TERM GOAL #2   Title Pt will improve coordination for ADLs (fastening jewerly) as shown by improving time on 9-hole peg test by at least 5sec with R hand.   Baseline 41.75 secs   Time 8   Period Weeks   Status New     OT LONG TERM GOAL #3   Title Pt will improve coordination/functional reaching for ADLs as shown by improving score on box and blocks with LUE by at least 4 blocks   Baseline LUE 38 blocks   Time 8   Period Weeks   Status New     OT LONG TERM GOAL #4   Title Pt will button/unbutton 3 buttons on tabletop in 33 sec or less.   Baseline 38.22   Time 8   Period Weeks   Status New     OT LONG TERM GOAL #5   Title Pt will improve time on PPT#2 (simulated eating) to 20 sec or less using AE/strategies prn.   Baseline 27.53 secs with 1 drop   Time 8   Period Weeks   Status New               Plan - 05/27/16 3790    Clinical Impression Statement Pt is progressing towards goals. Pt returned demonstration of PWR! exercises from HEP.   Rehab Potential Good   OT Frequency --  12 visits   OT Duration 8 weeks   OT Treatment/Interventions Self-care/ADL training;Moist Heat;Fluidtherapy;DME and/or AE instruction;Patient/family education;Balance training;Therapeutic exercises;Ultrasound;Therapeutic exercise;Therapeutic activities;Passive range of motion;Cognitive remediation/compensation;Energy conservation;Manual Therapy;Cryotherapy;Neuromuscular education;Functional Mobility Training   Plan check on exercise flowsheet, adpated strategies for ADLs   OT Home Exercise Plan PWR! modified quadraped, coordination( seated, standing issued by PT   Consulted and Agree with Plan of Care Patient      Patient will benefit from skilled therapeutic intervention in order to improve the following deficits and impairments:  Abnormal gait,  Decreased knowledge of use of DME, Impaired flexibility, Decreased coordination, Decreased endurance, Decreased range of motion, Decreased strength, Impaired tone, Decreased balance, Decreased knowledge of precautions, Decreased safety awareness, Impaired perceived functional ability, Impaired UE functional use  Visit Diagnosis: Other lack of coordination  Abnormal posture  Other symptoms and signs involving the musculoskeletal system  Other symptoms and signs involving the nervous system    Problem List Patient Active  Problem List   Diagnosis Date Noted  . Cervical spondylosis with myelopathy and radiculopathy 11/14/2013  . Essential tremor 06/13/2013  . Cervical spondylosis without myelopathy 06/13/2013  . Breast cancer of lower-outer quadrant of left female breast ([place]) 09/10/2010    [name],[name] 05/27/2016, 9:22 AM  [facility] [address] [place], [place], [address] Phone: [phone]   Fax:  [phone]  Name: April Church MRN: [id number] Date of Birth: [birth year]

## 2016-05-28 ENCOUNTER — Ambulatory Visit: Payer: Medicare Other | Admitting: Occupational Therapy

## 2016-05-28 DIAGNOSIS — R29818 Other symptoms and signs involving the nervous system: Secondary | ICD-10-CM | POA: Diagnosis not present

## 2016-05-28 DIAGNOSIS — R278 Other lack of coordination: Secondary | ICD-10-CM | POA: Diagnosis not present

## 2016-05-28 DIAGNOSIS — R293 Abnormal posture: Secondary | ICD-10-CM

## 2016-05-28 DIAGNOSIS — R29898 Other symptoms and signs involving the musculoskeletal system: Secondary | ICD-10-CM | POA: Diagnosis not present

## 2016-05-28 NOTE — Therapy (Signed)
[facility] [address] [place], [place], [address] Phone: [phone]   Fax:  [phone]  Occupational Therapy Treatment  Patient Details  Name: April Church MRN: [id number] Date of Birth: 18-May-1940 Referring Provider: Dr. Leta Baptist  Encounter Date: 05/28/2016      OT End of Session - 05/28/16 1607    Visit Number 5   Number of Visits 13   Date for OT Re-Evaluation 07/04/16   Authorization Type Medicare   Authorization Time Period 35 days(05/06/16-07/04/16)   Authorization - Visit Number 6   Authorization - Number of Visits 10   OT Start Time 6962   OT Stop Time 1230   OT Time Calculation (min) 39 min      Past Medical History:  Diagnosis Date  . Asthma    daily inhaler  . Breast cancer ([place])    left- radiation and surgery -dx. 2011- no further tx. now- [name] , [name]  . Cyst of finger 11/2011   annular cyst right long finger  . Dental crowns present   . Dermatitis   . Frequency of urination   . GERD (gastroesophageal reflux disease)   . Hemorrhoid   . Hyperlipidemia   . Hypertension    under control, has been on med. x 2 yrs.  [name] PONV (postoperative nausea and vomiting)   . Seasonal allergies   . Tremors of nervous system    hands  . Trigger finger of right hand 11/2011   long finger    Past Surgical History:  Procedure Laterality Date  . ANTERIOR CERVICAL DECOMPRESSION/DISCECTOMY FUSION 4 LEVELS N/A 11/14/2013   Procedure: ANTERIOR CERVICAL DECOMPRESSION/DISCECTOMY FUSION 4 LEVELS;  Surgeon: Newman Pies, MD;  Location: [facility];  Service: Neurosurgery;  Laterality: N/A;  C34 C45 C56 C67 anterior cervical fusion with interbody prosthesis plating and  bonegraft  . APPENDECTOMY  age 35  . BREAST LUMPECTOMY  06/16/2009   left; SLN bx.  [name] BREAST LUMPECTOMY  07/01/2009   re-excision  . BREAST SURGERY  1999   reduction  . CATARACT EXTRACTION, BILATERAL    . COLONOSCOPY WITH  PROPOFOL N/A 12/03/2014   Procedure: COLONOSCOPY WITH PROPOFOL;  Surgeon: [name], MD;  Location[facility];  Service: Endoscopy;  Laterality: N/A;  . FOOT SURGERY  06/22/11   left  . KNEE ARTHROSCOPY  03/12/2005   right  . KNEE ARTHROSCOPY     left  . NASAL SINUS SURGERY     x 2  . 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 TRIGGER FINGER RELEASE  12/16/2011   Procedure: RELEASE TRIGGER FINGER/A-1 PULLEY;  Surgeon: Tennis Must, MD;  Location: [facility];  Service: Orthopedics;  Laterality: Right;  RIGHT LONG FINGER TRIGGER RELEASE & ANNULAR CYST EXCISION  . TUMOR EXCISION  age 74   right arm    There were no vitals filed for this visit.      Subjective Assessment - 05/27/16 0922    Subjective  Denies pain   Pertinent History see Epic, PD, essential tremor, hx of breast CA, hx of cervical decompression   Patient Stated Goals improve coordination for ADLs   Currently in Pain? No/denies          Treatment:Flipping and dealing playing cards with bilateral UE's cues to rest RUE on table top to stabilize and decrease tremors min v.c for performance.  OT Education - 05/28/16 1603    Education provided Yes   Education Details education performed regarding buttonhook and magnetic clasps for jewelry, pt was provided with information for purchase, community resources, and ways to prevent future PD related complications.Terence Lux) Educated Patient   Methods Explanation;Demonstration;Verbal cues   Comprehension Verbalized understanding;Returned demonstration;Verbal cues required          OT Short Term Goals - 05/28/16 1151      OT SHORT TERM GOAL #1   Title Pt will be independent with HEP--check 06/06/16   Time 4   Period Weeks   Status Achieved     OT SHORT TERM GOAL #2   Title Pt will verbalize understanding of ways to prevent future complications and verbalize understanding of  community resources.   Time 4   Period Weeks   Status Achieved           OT Long Term Goals - 05/06/16 1139      OT LONG TERM GOAL #1   Title Pt will verbalize understanding of strategies/AE to incr ease with ADLs (including eating, writing, buttoning).   Time 8   Period Weeks   Status New     OT LONG TERM GOAL #2   Title Pt will improve coordination for ADLs (fastening jewerly) as shown by improving time on 9-hole peg test by at least 5sec with R hand.   Baseline 41.75 secs   Time 8   Period Weeks   Status New     OT LONG TERM GOAL #3   Title Pt will improve coordination/functional reaching for ADLs as shown by improving score on box and blocks with LUE by at least 4 blocks   Baseline LUE 38 blocks   Time 8   Period Weeks   Status New     OT LONG TERM GOAL #4   Title Pt will button/unbutton 3 buttons on tabletop in 33 sec or less.   Baseline 38.22   Time 8   Period Weeks   Status New     OT LONG TERM GOAL #5   Title Pt will improve time on PPT#2 (simulated eating) to 20 sec or less using AE/strategies prn.   Baseline 27.53 secs with 1 drop   Time 8   Period Weeks   Status New               Plan - 05/28/16 1605    Clinical Impression Statement Pt is progressing towards goals. She demonstrates understanding of AE suggestions   Rehab Potential Good   OT Frequency --  12   OT Duration 8 weeks   OT Treatment/Interventions Self-care/ADL training;Moist Heat;Fluidtherapy;DME and/or AE instruction;Patient/family education;Balance training;Therapeutic exercises;Ultrasound;Therapeutic exercise;Therapeutic activities;Passive range of motion;Cognitive remediation/compensation;Energy conservation;Manual Therapy;Cryotherapy;Neuromuscular education;Functional Mobility Training   Plan check progress towards goals, anticipate d/c in next sceral visits   OT Home Exercise Plan PWR! modified quadraped, coordination( seated, standing issued by PT   Consulted and Agree with  Plan of Care Patient      Patient will benefit from skilled therapeutic intervention in order to improve the following deficits and impairments:  Abnormal gait, Decreased knowledge of use of DME, Impaired flexibility, Decreased coordination, Decreased endurance, Decreased range of motion, Decreased strength, Impaired tone, Decreased balance, Decreased knowledge of precautions, Decreased safety awareness, Impaired perceived functional ability, Impaired UE functional use  Visit Diagnosis: Other lack of coordination  Abnormal posture    Problem List Patient Active Problem List   Diagnosis Date Noted  .  Cervical spondylosis with myelopathy and radiculopathy 11/14/2013  . Essential tremor 06/13/2013  . Cervical spondylosis without myelopathy 06/13/2013  . Breast cancer of lower-outer quadrant of left female breast ([place]) 09/10/2010    [name],[name] 05/28/2016, 4:09 PM  [facility] [address] [place], [place], [address] Phone: [phone]   Fax:  [phone]  Name: April Church MRN: [id number] Date of Birth: [birth year]

## 2016-06-01 ENCOUNTER — Ambulatory Visit: Payer: Medicare Other | Admitting: Occupational Therapy

## 2016-06-01 DIAGNOSIS — R278 Other lack of coordination: Secondary | ICD-10-CM | POA: Diagnosis not present

## 2016-06-01 DIAGNOSIS — R29898 Other symptoms and signs involving the musculoskeletal system: Secondary | ICD-10-CM

## 2016-06-01 DIAGNOSIS — R29818 Other symptoms and signs involving the nervous system: Secondary | ICD-10-CM | POA: Diagnosis not present

## 2016-06-01 DIAGNOSIS — R293 Abnormal posture: Secondary | ICD-10-CM

## 2016-06-01 NOTE — Telephone Encounter (Signed)
Noted  

## 2016-06-01 NOTE — Therapy (Signed)
[facility] [name] [place], [place], [address] Phone: [phone]   Fax:  [phone]  Occupational Therapy Treatment  Patient Details  Name: April Church MRN: [id number] Date of Birth: March 26, 1940 Referring Provider: Dr. Leta Baptist  Encounter Date: 06/01/2016      OT End of Session - 06/01/16 1712    Visit Number 6   Number of Visits 13   Date for OT Re-Evaluation 07/04/16   Authorization Type Medicare   Authorization Time Period 49 days(05/06/16-07/04/16)   Authorization - Visit Number 7   Authorization - Number of Visits 10   OT Start Time 1104   OT Stop Time 1145   OT Time Calculation (min) 41 min   Activity Tolerance Patient tolerated treatment well   Behavior During Therapy [facility] for tasks assessed/performed      Past Medical History:  Diagnosis Date  . Asthma    daily inhaler  . Breast cancer ([place])    left- radiation and surgery -dx. 2011- no further tx. now- [name] , [name]  . Cyst of finger 11/2011   annular cyst right long finger  . Dental crowns present   . Dermatitis   . Frequency of urination   . GERD (gastroesophageal reflux disease)   . Hemorrhoid   . Hyperlipidemia   . Hypertension    under control, has been on med. x 2 yrs.  [name] PONV (postoperative nausea and vomiting)   . Seasonal allergies   . Tremors of nervous system    hands  . Trigger finger of right hand 11/2011   long finger    Past Surgical History:  Procedure Laterality Date  . ANTERIOR CERVICAL DECOMPRESSION/DISCECTOMY FUSION 4 LEVELS N/A 11/14/2013   Procedure: ANTERIOR CERVICAL DECOMPRESSION/DISCECTOMY FUSION 4 LEVELS;  Surgeon: Newman Pies, MD;  Location: [facility];  Service: Neurosurgery;  Laterality: N/A;  C34 C45 C56 C67 anterior cervical fusion with interbody prosthesis plating and  bonegraft  . APPENDECTOMY  age 29  . BREAST LUMPECTOMY  06/16/2009   left; SLN bx.  [name] BREAST LUMPECTOMY   07/01/2009   re-excision  . BREAST SURGERY  1999   reduction  . CATARACT EXTRACTION, BILATERAL    . COLONOSCOPY WITH PROPOFOL N/A 12/03/2014   Procedure: COLONOSCOPY WITH PROPOFOL;  Surgeon: [name], MD;  [facility];  Service: Endoscopy;  Laterality: N/A;  . FOOT SURGERY  06/22/11   left  . KNEE ARTHROSCOPY  03/12/2005   right  . KNEE ARTHROSCOPY     left  . NASAL SINUS SURGERY     x 2  . 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 TRIGGER FINGER RELEASE  12/16/2011   Procedure: RELEASE TRIGGER FINGER/A-1 PULLEY;  Surgeon: Tennis Must, MD;  Location: [facility];  Service: Orthopedics;  Laterality: Right;  RIGHT LONG FINGER TRIGGER RELEASE & ANNULAR CYST EXCISION  . TUMOR EXCISION  age 47   right arm    There were no vitals filed for this visit.    Reviewed PWR! Modified quadraped and standing exercises from HEP, 10-20 reps each, min v.c. For large amplitude movements and to avoid arching back. Therapist started checking progress towards goals. Pt was educated regarding use of a buttonhook, particularly for increased ease with wrist cuff buttons, pt returned demonstration. Reviewed coordination activities: flipping / dealing cards, stacking/ picking up coins with large amplitude movements. Strategy for carrying a plate of food and a  beverage with out spills, pt returned demonstration.                          OT Short Term Goals - 05/28/16 1151      OT SHORT TERM GOAL #1   Title Pt will be independent with HEP--check 06/06/16   Time 4   Period Weeks   Status Achieved     OT SHORT TERM GOAL #2   Title Pt will verbalize understanding of ways to prevent future complications and verbalize understanding of community resources.   Time 4   Period Weeks   Status Achieved           OT Long Term Goals - 06/01/16 1107      OT LONG TERM GOAL #1   Title Pt will verbalize understanding of  strategies/AE to incr ease with ADLs (including eating, writing, buttoning).   Time 8   Period Weeks   Status Achieved     OT LONG TERM GOAL #2   Title Pt will improve coordination for ADLs (fastening jewerly) as shown by improving time on 9-hole peg test by at least 5sec with R hand.   Baseline 41.75 secs   Time 8   Period Weeks   Status On-going     OT LONG TERM GOAL #3   Title Pt will improve coordination/functional reaching for ADLs as shown by improving score on box and blocks with LUE by at least 4 blocks   Time 8   Period Weeks   Status On-going     OT LONG TERM GOAL #4   Title Pt will button/unbutton 3 buttons on tabletop in 33 sec or less.   Time 8   Period Weeks   Status Not Met  39.62, 34.72 secs     OT LONG TERM GOAL #5   Title Pt will improve time on PPT#2 (simulated eating) to 20 sec or less using AE/strategies prn.   Period Weeks   Status Achieved  15.56 secs               Plan - 06/01/16 1711    Clinical Impression Statement Pt is progressing towards goals. she agrees with plans to d/c next visit.   Rehab Potential Good   OT Frequency --  12   OT Duration 8 weeks   OT Treatment/Interventions Self-care/ADL training;Moist Heat;Fluidtherapy;DME and/or AE instruction;Patient/family education;Balance training;Therapeutic exercises;Ultrasound;Therapeutic exercise;Therapeutic activities;Passive range of motion;Cognitive remediation/compensation;Energy conservation;Manual Therapy;Cryotherapy;Neuromuscular education;Functional Mobility Training   Plan check goals , anticipate d/c next visit   Consulted and Agree with Plan of Care Patient      Patient will benefit from skilled therapeutic intervention in order to improve the following deficits and impairments:  Abnormal gait, Decreased knowledge of use of DME, Impaired flexibility, Decreased coordination, Decreased endurance, Decreased range of motion, Decreased strength, Impaired tone, Decreased balance,  Decreased knowledge of precautions, Decreased safety awareness, Impaired perceived functional ability, Impaired UE functional use  Visit Diagnosis: Other lack of coordination  Abnormal posture  Other symptoms and signs involving the musculoskeletal system  Other symptoms and signs involving the nervous system    Problem List Patient Active Problem List   Diagnosis Date Noted  . Cervical spondylosis with myelopathy and radiculopathy 11/14/2013  . Essential tremor 06/13/2013  . Cervical spondylosis without myelopathy 06/13/2013  . Breast cancer of lower-outer quadrant of left female breast ([place]) 09/10/2010    Lashaya Kienitz 06/01/2016, 5:14 PM  [facility]  [address] [place], [place], [address] Phone: [phone]   Fax:  [phone]  Name: April Church MRN: [id number] Date of Birth: 11/01/1940

## 2016-06-01 NOTE — Telephone Encounter (Signed)
Patient's husband called back they are not going to move forward with DAT - Scan her insurance does not cover any of the test.

## 2016-06-04 ENCOUNTER — Ambulatory Visit: Payer: Medicare Other | Admitting: Occupational Therapy

## 2016-06-04 DIAGNOSIS — R278 Other lack of coordination: Secondary | ICD-10-CM | POA: Diagnosis not present

## 2016-06-04 DIAGNOSIS — R29818 Other symptoms and signs involving the nervous system: Secondary | ICD-10-CM | POA: Diagnosis not present

## 2016-06-04 DIAGNOSIS — R293 Abnormal posture: Secondary | ICD-10-CM

## 2016-06-04 DIAGNOSIS — R29898 Other symptoms and signs involving the musculoskeletal system: Secondary | ICD-10-CM

## 2016-06-04 NOTE — Therapy (Signed)
[facility] [address] [place], [place], [address] Phone: [phone]   Fax:  [phone]  Occupational Therapy Treatment  Patient Details  Name: ZAELYNN FUCHS MRN: [id number] Date of Birth: Jan 21, 1941 Referring Provider: Dr. Leta Baptist  Encounter Date: 06/04/2016    Past Medical History:  Diagnosis Date  . Asthma    daily inhaler  . Breast cancer ([place])    left- radiation and surgery -dx. 2011- no further tx. now- [name] , [name]  . Cyst of finger 11/2011   annular cyst right long finger  . Dental crowns present   . Dermatitis   . Frequency of urination   . GERD (gastroesophageal reflux disease)   . Hemorrhoid   . Hyperlipidemia   . Hypertension    under control, has been on med. x 2 yrs.  [name] PONV (postoperative nausea and vomiting)   . Seasonal allergies   . Tremors of nervous system    hands  . Trigger finger of right hand 11/2011   long finger    Past Surgical History:  Procedure Laterality Date  . ANTERIOR CERVICAL DECOMPRESSION/DISCECTOMY FUSION 4 LEVELS N/A 11/14/2013   Procedure: ANTERIOR CERVICAL DECOMPRESSION/DISCECTOMY FUSION 4 LEVELS;  Surgeon: Newman Pies, MD;  Location: [facility];  Service: Neurosurgery;  Laterality: N/A;  C34 C45 C56 C67 anterior cervical fusion with interbody prosthesis plating and  bonegraft  . APPENDECTOMY  age 69  . BREAST LUMPECTOMY  06/16/2009   left; SLN bx.  [name] BREAST LUMPECTOMY  07/01/2009   re-excision  . BREAST SURGERY  1999   reduction  . CATARACT EXTRACTION, BILATERAL    . COLONOSCOPY WITH PROPOFOL N/A 12/03/2014   Procedure: COLONOSCOPY WITH PROPOFOL;  Surgeon: [name], MD;  Location: [facility];  Service: Endoscopy;  Laterality: N/A;  . FOOT SURGERY  06/22/11   left  . KNEE ARTHROSCOPY  03/12/2005   right  . KNEE ARTHROSCOPY     left  . NASAL SINUS SURGERY     x 2  . 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 TRIGGER FINGER RELEASE  12/16/2011   Procedure: RELEASE TRIGGER FINGER/A-1 PULLEY;  Surgeon: Tennis Must, MD;  Location: [facility];  Service: Orthopedics;  Laterality: Right;  RIGHT LONG FINGER TRIGGER RELEASE & ANNULAR CYST EXCISION  . TUMOR EXCISION  age 30   right arm    There were no vitals filed for this visit.      Subjective Assessment - 06/04/16 1146    Subjective  Denies pain   Pertinent History see Epic, PD, essential tremor, hx of breast CA, hx of cervical decompression   Patient Stated Goals improve coordination for ADLs   Currently in Pain? No/denies                    Therapist checked progress towards goals.          OT Education - 06/04/16 1304    Education provided Yes   Education Details PWR ! up, rock and twist modified quadraped, ways to keep thinking skills sharp, multi directional stepping following instructions, flipping/ dealing playing cards with large amplitude movements.   Person(s) Educated Patient   Methods Explanation   Comprehension Verbalized understanding;Returned demonstration;Verbal cues required          OT Short Term Goals - 05/28/16 1151      OT SHORT TERM GOAL #1   Title Pt will  be independent with HEP--check 06/06/16   Time 4   Period Weeks   Status Achieved     OT SHORT TERM GOAL #2   Title Pt will verbalize understanding of ways to prevent future complications and verbalize understanding of community resources.   Time 4   Period Weeks   Status Achieved           OT Long Term Goals - Jun 15, 2016 1202      OT LONG TERM GOAL #1   Title Pt will verbalize understanding of strategies/AE to incr ease with ADLs (including eating, writing, buttoning).   Time 8   Period Weeks   Status Achieved     OT LONG TERM GOAL #2   Title Pt will improve coordination for ADLs (fastening jewerly) as shown by improving time on 9-hole peg test by at least 5sec with R hand.   Baseline 41.75  secs   Time 8   Period Weeks   Status Deferred  RUE not tested, LUE 31.22 secs     OT LONG TERM GOAL #3   Title Pt will improve coordination/functional reaching for ADLs as shown by improving score on box and blocks with LUE by at least 4 blocks   Baseline LUE 38 blocks   Time 8   Period Weeks   Status Achieved  44 blocks     OT LONG TERM GOAL #4   Title Pt will button/unbutton 3 buttons on tabletop in 33 sec or less.   Time 8   Period Weeks   Status Not Met  39.62, 34.72 secs     OT LONG TERM GOAL #5   Title Pt will improve time on PPT#2 (simulated eating) to 20 sec or less using AE/strategies prn.   Period Weeks   Status Achieved  15.56 secs               Plan - 2016-06-15 1306    Clinical Impression Statement Pt agrees with plans for discharge. She can benefit from a multidisciplinary PD screen in 6 mons.   Rehab Potential Good   OT Frequency --  12   OT Duration 8 weeks   OT Treatment/Interventions Self-care/ADL training;Moist Heat;Fluidtherapy;DME and/or AE instruction;Patient/family education;Balance training;Therapeutic exercises;Ultrasound;Therapeutic exercise;Therapeutic activities;Passive range of motion;Cognitive remediation/compensation;Energy conservation;Manual Therapy;Cryotherapy;Neuromuscular education;Functional Mobility Training   Plan discharge OT   OT Home Exercise Plan PWR! modified quadraped, coordination( seated, standing issued by PT   Consulted and Agree with Plan of Care Patient      Patient will benefit from skilled therapeutic intervention in order to improve the following deficits and impairments:  Abnormal gait, Decreased knowledge of use of DME, Impaired flexibility, Decreased coordination, Decreased endurance, Decreased range of motion, Decreased strength, Impaired tone, Decreased balance, Decreased knowledge of precautions, Decreased safety awareness, Impaired perceived functional ability, Impaired UE functional use  Visit  Diagnosis: Other lack of coordination  Abnormal posture  Other symptoms and signs involving the musculoskeletal system  Other symptoms and signs involving the nervous system      G-Codes - 06/15/2016 1307    Functional Assessment Tool Used (Outpatient only) 3 button/ unbutton 39.62, 34.72 secs, PPT#2: 15.56 secs   Functional Limitation Self care   Self Care Goal Status (J2878) At least 1 percent but less than 20 percent impaired, limited or restricted   Self Care Discharge Status (M7672) At least 1 percent but less than 20 percent impaired, limited or restricted    OCCUPATIONAL THERAPY DISCHARGE SUMMARY   Current functional level related  to goals / functional outcomes:  Pt made good overall progress towards goals. See above. Remaining deficits: Tremor, decreased coordination, decreased balance, abnormal posture, bradykinesia    Education / Equipment: Pt was educated regarding adapted strategies for ADLs, ways to keep thinking skills sharp, and HEP. Pt verbalizes understanding of all education.  Plan: Patient agrees to discharge.  Patient goals were partially met. Patient is being discharged due to being pleased with the current functional level.  ?????      Problem List Patient Active Problem List   Diagnosis Date Noted  . Cervical spondylosis with myelopathy and radiculopathy 11/14/2013  . Essential tremor 06/13/2013  . Cervical spondylosis without myelopathy 06/13/2013  . Breast cancer of lower-outer quadrant of left female breast ([place]) 09/10/2010    Ignacio Lowder 06/04/2016, 3:19 PM  [facility] [address] [place], [place], [address] Phone: [phone]   Fax:  [phone]  Name: KACELYN ROWZEE MRN: [id number] Date of Birth: [birth year]

## 2016-06-07 ENCOUNTER — Encounter: Payer: Medicare Other | Admitting: Occupational Therapy

## 2016-06-09 ENCOUNTER — Encounter: Payer: Medicare Other | Admitting: Occupational Therapy

## 2016-06-15 ENCOUNTER — Encounter: Payer: Medicare Other | Admitting: Occupational Therapy

## 2016-06-16 DIAGNOSIS — Z524 Kidney donor: Secondary | ICD-10-CM | POA: Diagnosis not present

## 2016-06-17 ENCOUNTER — Ambulatory Visit: Payer: Medicare Other | Admitting: Allergy

## 2016-06-18 ENCOUNTER — Encounter: Payer: Medicare Other | Admitting: Occupational Therapy

## 2016-07-14 ENCOUNTER — Ambulatory Visit (INDEPENDENT_AMBULATORY_CARE_PROVIDER_SITE_OTHER): Payer: Medicare Other | Admitting: Allergy

## 2016-07-14 ENCOUNTER — Encounter: Payer: Self-pay | Admitting: Allergy

## 2016-07-14 VITALS — BP 128/68 | HR 80 | Temp 97.7°F | Resp 20 | Ht 60.5 in | Wt 132.2 lb

## 2016-07-14 DIAGNOSIS — T782XXD Anaphylactic shock, unspecified, subsequent encounter: Secondary | ICD-10-CM | POA: Diagnosis not present

## 2016-07-14 DIAGNOSIS — J310 Chronic rhinitis: Secondary | ICD-10-CM | POA: Diagnosis not present

## 2016-07-14 DIAGNOSIS — J4530 Mild persistent asthma, uncomplicated: Secondary | ICD-10-CM | POA: Diagnosis not present

## 2016-07-14 DIAGNOSIS — Z91018 Allergy to other foods: Secondary | ICD-10-CM

## 2016-07-14 DIAGNOSIS — J3802 Paralysis of vocal cords and larynx, bilateral: Secondary | ICD-10-CM | POA: Diagnosis not present

## 2016-07-14 DIAGNOSIS — Z524 Kidney donor: Secondary | ICD-10-CM | POA: Diagnosis not present

## 2016-07-14 MED ORDER — FLUTICASONE PROPIONATE 50 MCG/ACT NA SUSP: 1.0000 | Freq: Two times a day (BID) | NASAL | 5 refills | Status: DC

## 2016-07-14 MED ORDER — FLUTICASONE PROPIONATE HFA 110 MCG/ACT IN AERO: 2.0000 | INHALATION_SPRAY | Freq: Two times a day (BID) | RESPIRATORY_TRACT | 3 refills | Status: DC

## 2016-07-14 MED ORDER — AZELASTINE HCL 0.15 % NA SOLN: 2.0000 | Freq: Two times a day (BID) | NASAL | 5 refills | Status: DC

## 2016-07-14 NOTE — Patient Instructions (Addendum)
History of Anaphylaxis --     - continue avoidance of shellfish and wine    - have access to self-injectable epinephrine (AuviQ sample provided today) in case of allergic reaction. Follow emergency action plan in case of allergic reaction    - continue Allegra 180mg  daily    - we will try to get your old labs from previous allergist to have in our records  Asthma --    - will change your Qvar to Flovent 168mcg (due to insurance coverage) take 1 puff twice a day.   During illness/flares increase to 2 puffs twice a day.     - have access to albuterol inhaler use 2 puffs every 4-6 hours as needed for cough, wheeze, shortness of breath or difficulty breathing  Asthma control goals:   Full participation in all desired activities (may need albuterol before activity)  Albuterol use two time or less a week on average (not counting use with activity)  Cough interfering with sleep two time or less a month  Oral steroids no more than once a year  No hospitalizations  Non-allergic rhinitis --     - continue use of Flonase 1-2 sprays each nostril as needed for nasal congestion/drainage     - continue as needed use of Atrovent 1-2 sprays each nostril up to 4 times a day for nasal drainage    - Allegra as above  Vocal fold paralysis --    - continue routine follow-up with Dr. Joya Gaskins with ENT for periodic botox injections .   Would advise you follow-up with me after you have injection in the future to repeat your breathing function test.    Follow-up 4-6 months or sooner if needed

## 2016-07-14 NOTE — Progress Notes (Signed)
New Patient Note  RE: April Church MRN: [id number] DOB: June 01, 1940 Date of Office Visit: 07/14/2016  Referring provider: Leeroy Cha,* Primary care provider: Leeroy Cha, MD  Chief Complaint: establish care for allergies and asthma  History of present illness: April Church is a 76 y.o. female presenting today for consultation for history of asthma, anaphylaxis and non-allergic rhinitis.  She has previously seen a PA at Allergy/Immunology at [facility] for idiopathic anaphylaxis, asthma, non-allergic rhinitis and possible drug allergy. Her last visit with Kathie Dike, PA was on 11/07/2015.   Her allergist office however moved from [place] to [place], [place] and this has become inconvenient for her thus she presents today for establishment of care. She reports she has been seeing an allergist since 1970s.    For anaphylaxis she has had previous testing done for food allergy that was negative to shrimp, soy, corn, cashew, hazelnut, [place] nut, almond, pecan and strawberry per available notes in EMR (no lab data is available at this time).   She reports she had anaphylaxis to unknown trigger while she was eating at a restaurant about 7-8 years ago.  Testing did not reveal any sensitivities.  She has not had any further reactions since and has not needed to use Epipen which she carries (her current EpiPen has an expiration date of 2016).  She has been advised to take Allegra once or twice daily and have access to her EpiPen. She does avoid shellfish - reports symptoms of being "choked up", sneezing, runny nose; wine (both red and white) - reports symptoms of turns red all over, sneezing and runny nose.   She is able to eat fish without issue and doesn't avoid any other foods.    She is on an ICS Qvar 1 puff twice a day for asthma control.  She feel like her asthma control is good.  She has albuterol which she reports she last used about 3 months ago for cough and  wheeze.  She reports she may need to use her albuterol several times every 3-4 months or so on average. She reports triggers are changes in weather.  She denies any oral steroid use and denies any nighttime symptoms.    She has history of allergic rhinitis and was treated in the past with allergen immunotherapy for approximately 9 years before she stopped.    Since it appears per her records that she does not have any more sensitivity to environmental allergens as she has been labeled to have nonallergic rhinitis and/or vasomotor rhinitis.  She reports runny nose, sneezing which is worse in the morning.  She uses Flonase at night as well as Atrovent a needed nasal sprays previously prescribed. She states these to nasal sprays are very helpful in controlling her nasal symptoms.   She take allegra daily as above.   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 She does follow with Dr. Joya Gaskins in ENT.   She does endorse having hoarseness currently.   She sees an neurologist for tremors who believes she is also experiencing tremors of her voice box.  She has received botox inections into her vocal folds periodically through her ENT which does help with her voice. her last injection was about 3 months ago and she feels she is due for another injection.  She has also done for voice therapy for voice retraining through the speech therapists at [facility].   She also has possible drug allergy for which she  reports developing a rash about 4 days in to duloxetine.  She stopped the duloxetine and the rash resolved within a few weeks.  At this time she did have a tryptase, CBC, CMP and TSH that were checked that was reported to be normal.  She sees neurology for her tremors as well as nephrology for history of kidney donation.   Review of systems: Review of Systems  Constitutional: Negative for chills, fever and malaise/fatigue.  HENT: Positive for congestion. Negative for ear  discharge, ear pain, nosebleeds, sinus pain, sore throat and tinnitus.   Eyes: Negative for discharge and redness.  Respiratory: Negative for cough, shortness of breath and wheezing.   Cardiovascular: Negative for chest pain.  Gastrointestinal: Negative for abdominal pain, diarrhea, heartburn, nausea and vomiting.  Musculoskeletal: Negative for myalgias.  Skin: Negative for itching and rash.  Neurological: Positive for tremors and speech change. Negative for focal weakness and headaches.    All other systems negative unless noted above in HPI  Past medical history: Past Medical History:  Diagnosis Date  . Asthma    daily inhaler  . Breast cancer ([place])    left- radiation and surgery -dx. 2011- no further tx. now- [name] , [name]  . Cyst of finger 11/2011   annular cyst right long finger  . Dental crowns present   . Dermatitis   . Frequency of urination   . GERD (gastroesophageal reflux disease)   . Hemorrhoid   . Hyperlipidemia   . Hypertension    under control, has been on med. x 2 yrs.  [name] PONV (postoperative nausea and vomiting)   . Seasonal allergies   . Tremors of nervous system    hands  . Trigger finger of right hand 11/2011   long finger    Past surgical history: Past Surgical History:  Procedure Laterality Date  . ANTERIOR CERVICAL DECOMPRESSION/DISCECTOMY FUSION 4 LEVELS N/A 11/14/2013   Procedure: ANTERIOR CERVICAL DECOMPRESSION/DISCECTOMY FUSION 4 LEVELS;  Surgeon: Newman Pies, MD;  Location: [facility];  Service: Neurosurgery;  Laterality: N/A;  C34 C45 C56 C67 anterior cervical fusion with interbody prosthesis plating and  bonegraft  . APPENDECTOMY  age 21  . BREAST LUMPECTOMY  06/16/2009   left; SLN bx.  [name] BREAST LUMPECTOMY  07/01/2009   re-excision  . BREAST SURGERY  1999   reduction  . CATARACT EXTRACTION, BILATERAL    . COLONOSCOPY WITH PROPOFOL N/A 12/03/2014   Procedure: COLONOSCOPY WITH PROPOFOL;  Surgeon: [name], MD;   Location: [facility];  Service: Endoscopy;  Laterality: N/A;  . FOOT SURGERY  06/22/11   left  . KNEE ARTHROSCOPY  03/12/2005   right  . KNEE ARTHROSCOPY     left  . NASAL SINUS SURGERY     x 2  . 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 TRIGGER FINGER RELEASE  12/16/2011   Procedure: RELEASE TRIGGER FINGER/A-1 PULLEY;  Surgeon: Tennis Must, MD;  Location: [facility];  Service: Orthopedics;  Laterality: Right;  RIGHT LONG FINGER TRIGGER RELEASE & ANNULAR CYST EXCISION  . TUMOR EXCISION  age 27   right arm    Family history:  Family History  Problem Relation Age of Onset  . Kidney disease Mother   . Stroke Father   . Stroke Sister        08/2015  . Diabetes Brother   . Heart disease Brother   . Cancer Sister  Social history:   Pt lives at home with her spouse,[name], In a town home with carpeting in the bedroom with gas heating and central cooling. There are no pets in the home. There is no concern for water damage, mildew or roaches in the home.    Patient has 1 child.    Patient is retired. She has no smoking history     Medication List: Allergies as of 07/14/2016      Reactions   Shellfish Allergy Shortness Of Breath   Duloxetine Itching, Rash   Sulfa Drugs Cross Reactors Anxiety, Rash   Codeine Anxiety      Medication List       Accurate as of 07/14/16  2:46 PM. Always use your most recent med list.          albuterol 108 (90 Base) MCG/ACT inhaler Commonly known as:  PROVENTIL HFA;VENTOLIN HFA Inhale 1 puff into the lungs every 6 (six) hours as needed for wheezing or shortness of breath.   amLODipine 5 MG tablet Commonly known as:  NORVASC Take 1 tablet (5 mg total) by mouth 2 (two) times daily.   aspirin 81 MG tablet Take 81 mg by mouth daily.   atorvastatin 20 MG tablet Commonly known as:  LIPITOR Take 20 mg by mouth daily.   beclomethasone 80 MCG/ACT inhaler Commonly known as:  QVAR Inhale 1  puff into the lungs 2 (two) times daily.   CALCIUM CARBONATE PO Take 1 tablet by mouth 2 (two) times daily.   carbidopa-levodopa 25-100 MG tablet Commonly known as:  SINEMET IR Take 1 tablet by mouth 3 (three) times daily before meals.   Co Q 10 100 MG Caps Take by mouth.   cyclobenzaprine 5 MG tablet Commonly known as:  FLEXERIL Take 1 tablet (5 mg total) by mouth at bedtime.   EPINEPHrine 0.3 mg/0.3 mL Soaj injection Commonly known as:  EPI-PEN as needed.   fexofenadine 180 MG tablet Commonly known as:  ALLEGRA Take 180 mg by mouth daily.   Fish Oil 1200 MG Caps Take 1,200 mg by mouth 2 (two) times daily.   FLONASE 50 MCG/ACT nasal spray Generic drug:  fluticasone Place 1 spray into the nose 2 (two) times daily.   ipratropium 0.03 % nasal spray Commonly known as:  ATROVENT Place 2 sprays into both nostrils 2 (two) times daily.   primidone 50 MG tablet Commonly known as:  MYSOLINE Take 2 tablets (100 mg total) by mouth 3 (three) times daily.   ranitidine 150 MG capsule Commonly known as:  ZANTAC Take 150 mg by mouth daily after breakfast.   SYSTANE OP Place 1 drop into both eyes 2 (two) times daily.   triamcinolone cream 0.1 % Commonly known as:  KENALOG Apply 1 application topically 2 (two) times daily as needed (FOR RASH).   venlafaxine 37.5 MG tablet Commonly known as:  EFFEXOR Take 1 tablet (37.5 mg total) by mouth daily.   Vitamin D 1000 units capsule Take 1,000 Units by mouth 2 (two) times daily.       Known medication allergies: Allergies  Allergen Reactions  . Shellfish Allergy Shortness Of Breath  . Duloxetine Itching and Rash  . Sulfa Drugs Cross Reactors Anxiety and Rash  . Codeine Anxiety     Physical examination: Blood pressure 128/68, pulse 80, temperature 97.7 F (36.5 C), temperature source Oral, resp. rate 20, height 5' 0.5" (1.537 m), weight 132 lb 3.2 oz (60 kg).  General: Alert, interactive, in no acute  distress. HEENT:  TMs pearly gray, turbinates mildly edematous without discharge, post-pharynx non erythematous.  Voice is hoarse Neck: Supple without lymphadenopathy. Lungs: Clear to auscultation without wheezing, rhonchi or rales. {no increased work of breathing. CV: Normal S1, S2 without murmurs. Abdomen: Nondistended, nontender. Skin: Warm and dry, without lesions or rashes. Extremities:  No clubbing, cyanosis or edema. Positive tremor bilaterally more so in right hand  Neuro:   Grossly intact.  Diagnositics/Labs:  Spirometry: FEV1: 1.22L  66%, FVC: 1.41L  59%  Assessment and plan:   History of Anaphylaxis --     - continue avoidance of shellfish and wine    - have access to self-injectable epinephrine (AuviQ sample provided today) in case of allergic reaction. Follow emergency action plan in case of allergic reaction    - continue Allegra 180mg  daily    - we will try to get your old labs from previous allergist to have in our records  Asthma, mild persistent --    - will change your Qvar to Flovent 110mcg (due to insurance coverage) take 1 puff twice a day.   During illness/flares increase to 2 puffs twice a day.     - have access to albuterol inhaler use 2 puffs every 4-6 hours as needed for cough, wheeze, shortness of breath or difficulty breathing  Asthma control goals:   Full participation in all desired activities (may need albuterol before activity)  Albuterol use two time or less a week on average (not counting use with activity)  Cough interfering with sleep two time or less a month  Oral steroids no more than once a year  No hospitalizations  Non-allergic rhinitis --     - continue use of Flonase 1-2 sprays each nostril as needed for nasal congestion/drainage     - continue as needed use of Atrovent 1-2 sprays each nostril up to 4 times a day for nasal drainage    - Allegra as above  Vocal fold paralysis/vocal tremor --    - continue routine follow-up with Dr. Joya Gaskins with ENT  for periodic botox injections .   Would advise you follow-up with me after you have injection in the future to repeat your breathing function test.  I believe her lung functions today are somewhat reduced in part to her vocal fold dysfunction. She endorses having difficulty getting air in and out that she related to her vocal cord dysfunction.    Follow-up 4-6 months or sooner if needed  I appreciate the opportunity to take part in Kaytee's care. Please do not hesitate to contact me with questions.  Sincerely,   Prudy Feeler, MD Allergy/[facility]

## 2016-08-19 DIAGNOSIS — D1801 Hemangioma of skin and subcutaneous tissue: Secondary | ICD-10-CM | POA: Diagnosis not present

## 2016-08-19 DIAGNOSIS — D485 Neoplasm of uncertain behavior of skin: Secondary | ICD-10-CM | POA: Diagnosis not present

## 2016-09-01 DIAGNOSIS — D259 Leiomyoma of uterus, unspecified: Secondary | ICD-10-CM | POA: Diagnosis not present

## 2016-09-01 DIAGNOSIS — C50919 Malignant neoplasm of unspecified site of unspecified female breast: Secondary | ICD-10-CM | POA: Diagnosis not present

## 2016-09-01 DIAGNOSIS — Z77128 Contact with and (suspected) exposure to other hazards in the physical environment: Secondary | ICD-10-CM | POA: Diagnosis not present

## 2016-09-01 DIAGNOSIS — Z8041 Family history of malignant neoplasm of ovary: Secondary | ICD-10-CM | POA: Diagnosis not present

## 2016-09-01 DIAGNOSIS — Z124 Encounter for screening for malignant neoplasm of cervix: Secondary | ICD-10-CM | POA: Diagnosis not present

## 2016-09-01 DIAGNOSIS — Z01419 Encounter for gynecological examination (general) (routine) without abnormal findings: Secondary | ICD-10-CM | POA: Diagnosis not present

## 2016-11-10 DIAGNOSIS — R8290 Unspecified abnormal findings in urine: Secondary | ICD-10-CM | POA: Diagnosis not present

## 2016-11-10 DIAGNOSIS — Z23 Encounter for immunization: Secondary | ICD-10-CM | POA: Diagnosis not present

## 2016-11-10 DIAGNOSIS — G250 Essential tremor: Secondary | ICD-10-CM | POA: Diagnosis not present

## 2016-11-10 DIAGNOSIS — I10 Essential (primary) hypertension: Secondary | ICD-10-CM | POA: Diagnosis not present

## 2016-11-10 DIAGNOSIS — E785 Hyperlipidemia, unspecified: Secondary | ICD-10-CM | POA: Diagnosis not present

## 2016-11-10 DIAGNOSIS — C4491 Basal cell carcinoma of skin, unspecified: Secondary | ICD-10-CM | POA: Diagnosis not present

## 2016-11-12 DIAGNOSIS — J385 Laryngeal spasm: Secondary | ICD-10-CM | POA: Diagnosis not present

## 2016-11-15 ENCOUNTER — Encounter: Payer: Self-pay | Admitting: Diagnostic Neuroimaging

## 2016-11-15 ENCOUNTER — Ambulatory Visit (INDEPENDENT_AMBULATORY_CARE_PROVIDER_SITE_OTHER): Payer: Medicare Other | Admitting: Diagnostic Neuroimaging

## 2016-11-15 VITALS — BP 125/91 | HR 99 | Ht 60.5 in | Wt 129.0 lb

## 2016-11-15 DIAGNOSIS — M4712 Other spondylosis with myelopathy, cervical region: Secondary | ICD-10-CM | POA: Diagnosis not present

## 2016-11-15 DIAGNOSIS — G20 Parkinson's disease: Secondary | ICD-10-CM

## 2016-11-15 DIAGNOSIS — M4722 Other spondylosis with radiculopathy, cervical region: Secondary | ICD-10-CM

## 2016-11-15 DIAGNOSIS — G250 Essential tremor: Secondary | ICD-10-CM

## 2016-11-15 MED ORDER — PRIMIDONE 50 MG PO TABS: 100.0000 mg | ORAL_TABLET | Freq: Three times a day (TID) | ORAL | 4 refills | Status: DC

## 2016-11-15 MED ORDER — CARBIDOPA-LEVODOPA 25-100 MG PO TABS: 1.0000 | ORAL_TABLET | Freq: Three times a day (TID) | ORAL | 4 refills | Status: DC

## 2016-11-15 NOTE — Progress Notes (Signed)
[facility]  PATIENT: April Church DOB: 05/01/1940  REFERRING CLINICIAN:  HISTORY FROM: patient REASON FOR VISIT: follow up   Chief Complaint  Patient presents with  . Follow-up  . PD    doing alittle worse.  (L is worse then it has been). Had botox for throat this last Friday, at [facility].      HISTORICAL  CHIEF COMPLAINT:  Chief Complaint  Patient presents with  . Follow-up  . PD    doing alittle worse.  (L is worse then it has been). Had botox for throat this last Friday, at [facility].      HISTORY OF PRESENT ILLNESS:   UPDATE 11/15/16: Since last visit, overall doing well. Tremor stable. Some memory loss issues continue, but not affecting ADLs. Tolerating meds. No falls. 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   UPDATE 05/10/16: Since last visit, stable. Some mild tremors in left hand. Mild balance issues. Mild memory issues. Cyclobenz helps with night time spasms.   UPDATE 11/12/15: Since last visit, tremors are stable. Balance stable. Now with night time neck, shoulder, spine and leg pain (spasm). Had PT and OT evals as well.   UPDATE 05/13/15: Since last visit, tremor in arms better. Had ST and botox for voice issues. Some wearing off of meds before mid-day dosing. No neck pain issues.   UPDATE 02/12/15: Since last visit, tremor is stable. More voice strangulation issues. Tolerating primidone 100mg  TID.  UPDATE 08/13/14: Since last visit, tried primidone 250mg  BID, but not much tremor benefit; also having more sleepiness in the day time. Still with hand > voice > chin tremor. No falls. Balance getting slightly worse.  UPDATE 05/22/14: Since last visit, tremor progressing. Now notes new resting tremor in the last year. Some balance and walking issues. She is s/p cervical decompression in Sept 2015, with good results.   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 Her tremor does  not limit any of her activities of daily life. She denies any side effects of the Mysoline. She also reports today that she's had some numbness and tingling down her right arm from her neck and shoulder. It has been bothering her more in the last 6 months. 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 She saw Dr. Arnoldo Morale back in 2004. She returns for reevaluation  UPDATE 06/07/12 (VRP): Doing well. BUE tremor is stable and controlled. Voice tremor is slightly progressing. Tolerating primidone.  PRIOR HPI (06/09/11, VRP):  76 year old female returns for F/U. Last seen 06/09/10.  She has been followed in this office for many years for essential tremor. She takes primidone 50 mg, 1.5 TID. She tolerates it well. She comes in today saying that her tremor is a little worse before lunch.  The tremor limits her a little bit with eating and writing but for the most part she is happy with her level of function.She has hx of breast cancer.  No neurologic complaints.    REVIEW OF SYSTEMS: Full 14 system review of systems performed and negative except: memory loss food allergies light sens muscle cramps.     ALLERGIES: Allergies  Allergen Reactions  . Shellfish Allergy Shortness Of Breath  . Duloxetine Itching and Rash  . Sulfa Drugs Cross Reactors Anxiety and Rash  . Codeine Anxiety    HOME MEDICATIONS: Outpatient Medications Prior to Visit  Medication Sig Dispense  Refill  . albuterol (PROVENTIL HFA;VENTOLIN HFA) 108 (90 BASE) MCG/ACT inhaler Inhale 1 puff into the lungs every 6 (six) hours as needed for wheezing or shortness of breath.    [name] amLODipine (NORVASC) 5 MG tablet Take 1 tablet (5 mg total) by mouth 2 (two) times daily.    [name] aspirin 81 MG tablet Take 81 mg by mouth daily.      [name] atorvastatin (LIPITOR) 20 MG tablet Take 20 mg by mouth every other day.     . Azelastine HCl 0.15 % SOLN Place 2 sprays into both nostrils 2 (two) times daily. 30  mL 5  . CALCIUM CARBONATE PO Take 1 tablet by mouth 2 (two) times daily.    . carbidopa-levodopa (SINEMET IR) 25-100 MG tablet Take 1 tablet by mouth 3 (three) times daily before meals. 270 tablet 4  . Cholecalciferol (VITAMIN D) 1000 UNITS capsule Take 1,000 Units by mouth 2 (two) times daily.     . Coenzyme Q10 (CO Q 10) 100 MG CAPS Take by mouth.    . cyclobenzaprine (FLEXERIL) 5 MG tablet Take 1 tablet (5 mg total) by mouth at bedtime. 90 tablet 4  . EPINEPHrine 0.3 mg/0.3 mL IJ SOAJ injection as needed.    . fexofenadine (ALLEGRA) 180 MG tablet Take 180 mg by mouth daily.    . fluticasone (FLONASE) 50 MCG/ACT nasal spray Place 1 spray into both nostrils 2 (two) times daily. 16 g 5  . fluticasone (FLOVENT HFA) 110 MCG/ACT inhaler Inhale 2 puffs into the lungs 2 (two) times daily. 1 Inhaler 3  . ipratropium (ATROVENT) 0.03 % nasal spray Place 2 sprays into both nostrils 2 (two) times daily.    . Omega-3 Fatty Acids (FISH OIL) 1200 MG CAPS Take 1,200 mg by mouth 2 (two) times daily.     Vladimir Faster Glycol-Propyl Glycol (SYSTANE OP) Place 1 drop into both eyes 2 (two) times daily.    . primidone (MYSOLINE) 50 MG tablet Take 2 tablets (100 mg total) by mouth 3 (three) times daily. 540 tablet 4  . ranitidine (ZANTAC) 150 MG capsule Take 150 mg by mouth daily after breakfast.     . triamcinolone cream (KENALOG) 0.1 % Apply 1 application topically 2 (two) times daily as needed (FOR RASH).     [name] venlafaxine (EFFEXOR) 37.5 MG tablet Take 1 tablet (37.5 mg total) by mouth daily. 90 tablet 3  . beclomethasone (QVAR) 80 MCG/ACT inhaler Inhale 1 puff into the lungs 2 (two) times daily.      No facility-administered medications prior to visit.     PAST MEDICAL HISTORY: Past Medical History:  Diagnosis Date  . Asthma    daily inhaler  . Breast cancer ([place])    left- radiation and surgery -dx. 2011- no further tx. now- [name] , [name]  . Cyst of finger 11/2011   annular cyst right long finger   . Dental crowns present   . Dermatitis   . Frequency of urination   . GERD (gastroesophageal reflux disease)   . Hemorrhoid   . Hyperlipidemia   . Hypertension    under control, has been on med. x 2 yrs.  [name] PONV (postoperative nausea and vomiting)   . Seasonal allergies   . Tremors of nervous system    hands  . Trigger finger of right hand 11/2011   long finger    PAST SURGICAL HISTORY: Past Surgical History:  Procedure Laterality Date  . ANTERIOR CERVICAL DECOMPRESSION/DISCECTOMY FUSION 4  LEVELS N/A 11/14/2013   Procedure: ANTERIOR CERVICAL DECOMPRESSION/DISCECTOMY FUSION 4 LEVELS;  Surgeon: Newman Pies, MD;  Location: [facility];  Service: Neurosurgery;  Laterality: N/A;  C34 C45 C56 C67 anterior cervical fusion with interbody prosthesis plating and  bonegraft  . APPENDECTOMY  age 68  . BREAST LUMPECTOMY  06/16/2009   left; SLN bx.  [name] BREAST LUMPECTOMY  07/01/2009   re-excision  . BREAST SURGERY  1999   reduction  . CATARACT EXTRACTION, BILATERAL    . COLONOSCOPY WITH PROPOFOL N/A 12/03/2014   Procedure: COLONOSCOPY WITH PROPOFOL;  Surgeon: [name], MD;  Location: [facility];  Service: Endoscopy;  Laterality: N/A;  . FOOT SURGERY  06/22/11   left  . KNEE ARTHROSCOPY  03/12/2005   right  . KNEE ARTHROSCOPY     left  . NASAL SINUS SURGERY     x 2  . 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 TRIGGER FINGER RELEASE  12/16/2011   Procedure: RELEASE TRIGGER FINGER/A-1 PULLEY;  Surgeon: Tennis Must, MD;  Location: [facility];  Service: Orthopedics;  Laterality: Right;  RIGHT LONG FINGER TRIGGER RELEASE & ANNULAR CYST EXCISION  . TUMOR EXCISION  age 32   right arm    FAMILY HISTORY: Family History  Problem Relation Age of Onset  . Kidney disease Mother   . Stroke Father   . Stroke Sister        08/2015  . Diabetes Brother   . Heart disease Brother   . Cancer Sister     SOCIAL HISTORY:  Social History     Social History  . Marital status: Married    Spouse name: Marcello Moores   . Number of children: 1  . Years of education: HS   Occupational History  . Retired    Social History Main Topics  . Smoking status: Never Smoker  . Smokeless tobacco: Never Used  . Alcohol use No  . Drug use: No  . Sexual activity: Yes   Other Topics Concern  . Not on file   Social History Narrative      Pt lives at home with her spouse.  [name]    Patient has 1 child.    Patient is retired.    Patient has a HS education.    Patient is right handed.    Patient drinks caffeine occasionally.               PHYSICAL EXAM  Vitals:   11/15/16 1311  BP: (!) 125/91  Pulse: 99  Weight: 129 lb (58.5 kg)  Height: 5' 0.5" (1.537 m)   Body mass index is 24.78 kg/m.  GENERAL EXAM: Patient is in no distress  CARDIOVASCULAR: Regular rate and rhythm, no murmurs, no carotid bruits  NEUROLOGIC: MENTAL STATUS: awake, alert, language fluent, comprehension intact, naming intact; MASKED FACIES; NEG FRONTAL RELEASE SIGNS CRANIAL NERVE: pupils equal and reactive to light, visual fields full to confrontation, extraocular muscles intact, no nystagmus, facial sensation and strength symmetric, uvula midline, shoulder shrug symmetric, tongue midline; SOFT HOARSE VOICE MOTOR: normal bulk, POSTURAL AND ACTION TREMOR; INT REST TREMOR IN RUE > LUE; INT MOUTH TREMOR; BRADYKINESIA IN BUE and BLE; MILD COGWHEELING IN RUE > LUE; full strength in the BUE, BLE; VOICE TREMOR SENSORY: normal and symmetric to light touch COORDINATION: finger-nose-finger, fine finger movements normal REFLEXES: deep tendon reflexes present and symmetric GAIT/STATION: narrow based gait; SLOW AND CAUTIOUS, DECR ARM SWING  DIAGNOSTIC DATA (LABS, IMAGING, TESTING) - I reviewed patient records, labs, notes, testing and imaging myself where available.  Lab Results  Component Value Date   WBC 5.2 01/20/2015   HGB 12.0 01/20/2015   HCT 36.1  01/20/2015   MCV 88.4 01/20/2015   PLT 293 01/20/2015      Component Value Date/Time   NA 136 01/20/2015 1326   K 4.1 01/20/2015 1326   CL 100 11/06/2013 1032   CL 102 06/30/2012 1231   CO2 27 01/20/2015 1326   GLUCOSE 66 (L) 01/20/2015 1326   GLUCOSE 77 06/30/2012 1231   BUN 13.9 01/20/2015 1326   CREATININE 0.9 01/20/2015 1326   CALCIUM 8.8 01/20/2015 1326   PROT 7.4 01/20/2015 1326   ALBUMIN 3.6 01/20/2015 1326   AST 17 01/20/2015 1326   ALT 13 01/20/2015 1326   ALKPHOS 86 01/20/2015 1326   BILITOT <0.30 01/20/2015 1326   GFRNONAA 83 (L) 11/06/2013 1032   GFRAA >90 11/06/2013 1032   No results found for: CHOL No results found for: HGBA1C No results found for: VITAMINB12 No results found for: TSH   MRI brain - unremarkable per patient (done ~ early 2000's)  06/25/13 EMG/NCS 1. 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 2. No evidence of widespread underlying large fiber neuropathy.  11/14/13 CXR [I reviewed images myself and agree with interpretation. -VRP]  - Anterior cervical disc fusion localization images as described.    ASSESSMENT AND PLAN  76 y.o. year old female  has a past medical history of Asthma; Breast cancer ([place]); Cyst of finger (11/2011); Dental crowns present; Dermatitis; Frequency of urination; GERD (gastroesophageal reflux disease); Hemorrhoid; Hyperlipidemia; Hypertension; PONV (postoperative nausea and vomiting); Seasonal allergies; Tremors of nervous system; and Trigger finger of right hand (11/2011). here with essential tremor since 1990's. Now with intermittent resting tremor AND bradykinesia since 2016. May represent essential tremor with superimposed parkinson's disease + cervical myelopathy sequelae.   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)  Parkinsonism, unspecified Parkinsonism type  ([place])  Cervical spondylosis with myelopathy and radiculopathy  Essential tremor     PLAN:  PARKINSONISM  - I ordered DATscan at [place] (to confirm superimposed parkinsonism on top of essential tremor and cervical myelopathy) but not covered by insurance at this time - continue carb/levo 1 tab three times a day  - caution with walking and balance   MUSCLE SPASMS - try weaning off cyclobenzaprine 5mg  at bedtime for nocturnal muscle spasms (? 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 primidone (MYSOLINE) 50 MG tablet    Sig: Take 2 tablets (100 mg total) by mouth 3 (three) times daily.    Dispense:  540 tablet    Refill:  4  . carbidopa-levodopa (SINEMET IR) 25-100 MG tablet    Sig: Take 1 tablet by mouth 3 (three) times daily before meals.    Dispense:  270 tablet    Refill:  4   Return in about 6 months (around 05/15/2017).     Penni Bombard, MD 6/64/4034, 7:42 PM Certified in Neurology, Neurophysiology and Neuroimaging  [facility] [phone]  [address] [place], [place] [address] [phone]

## 2016-11-30 DIAGNOSIS — L821 Other seborrheic keratosis: Secondary | ICD-10-CM | POA: Diagnosis not present

## 2016-11-30 DIAGNOSIS — L905 Scar conditions and fibrosis of skin: Secondary | ICD-10-CM | POA: Diagnosis not present

## 2016-12-29 ENCOUNTER — Ambulatory Visit (INDEPENDENT_AMBULATORY_CARE_PROVIDER_SITE_OTHER): Payer: Medicare Other | Admitting: Allergy

## 2016-12-29 ENCOUNTER — Encounter: Payer: Self-pay | Admitting: Allergy

## 2016-12-29 VITALS — BP 112/76 | HR 96 | Resp 16

## 2016-12-29 DIAGNOSIS — J310 Chronic rhinitis: Secondary | ICD-10-CM

## 2016-12-29 DIAGNOSIS — K219 Gastro-esophageal reflux disease without esophagitis: Secondary | ICD-10-CM | POA: Diagnosis not present

## 2016-12-29 DIAGNOSIS — T782XXD Anaphylactic shock, unspecified, subsequent encounter: Secondary | ICD-10-CM

## 2016-12-29 DIAGNOSIS — J4530 Mild persistent asthma, uncomplicated: Secondary | ICD-10-CM

## 2016-12-29 DIAGNOSIS — J3802 Paralysis of vocal cords and larynx, bilateral: Secondary | ICD-10-CM | POA: Diagnosis not present

## 2016-12-29 DIAGNOSIS — T782XXA Anaphylactic shock, unspecified, initial encounter: Secondary | ICD-10-CM | POA: Insufficient documentation

## 2016-12-29 MED ORDER — FLUTICASONE PROPIONATE HFA 110 MCG/ACT IN AERO: 2.0000 | INHALATION_SPRAY | Freq: Two times a day (BID) | RESPIRATORY_TRACT | 3 refills | Status: DC

## 2016-12-29 MED ORDER — ALBUTEROL SULFATE HFA 108 (90 BASE) MCG/ACT IN AERS: 1.0000 | INHALATION_SPRAY | Freq: Four times a day (QID) | RESPIRATORY_TRACT | 3 refills | Status: DC | PRN

## 2016-12-29 MED ORDER — AZELASTINE HCL 0.15 % NA SOLN: 2.0000 | Freq: Two times a day (BID) | NASAL | 5 refills | Status: DC

## 2016-12-29 NOTE — Patient Instructions (Addendum)
Anaphylaxis, subsequent encounter Continue avoidance of shellfish and wine Have access to self injectable epinephrine in case of allergic reaction. Follow emergency action plan in case of allergic reaction Continue Allegra 180 mg daily  Chronic nonallergic rhinitis Allegra as above Continue Astelin. Begin 1 spray in each nostril in the morning and 1 spray in each nostril in the evening. May stop fluticasone nasal spray if your nose is not stuffy.  Mild persistent asthma, uncomplicated Continue Flovent 110, 2puffs in the a.m. and 2 puffs in the p.m. Continue Proventil HFA 1 puff into the lungs every 6 hours as needed for shortness of breath or wheezing  Vocal fold paralysis, bilateral Continue routine follow-up with Dr. Joya Gaskins with ENT for periodic Botox injections  GERD Continue ranitidine 150 in the am  Follow up in 6 months

## 2016-12-29 NOTE — Progress Notes (Signed)
[place] [address] Dept: [phone]  FAMILY NURSE PRACTITIONER FOLLOW UP NOTE  Patient ID: April Church, female    DOB: 1940/05/02  Age: 76 y.o. MRN: [id number] Date of Office Visit: 12/29/2016  Assessment  Chief Complaint: Asthma (states she gets sneezing fits in the AM)  HPI April Church is a 76 year old female presenting to the clinic today for follow-up of asthma, anaphylaxis, and non-allergic rhinitis, and GERD. She was last seen in this office on 07/14/2016 by Dr. Nelva Bush. At that visit she reported an anaphylactic reaction to an unknown trigger about 8 years prior, for which she was given an EpiPen. Testing at that time did not reveal any sensitivities. She continued to avoid shellfish and wine as these foods have caused her to feel "choked up" with sneezing and a runny nose. She was placed on an inhaled corticosteroid for asthma control. She reports that she had received immunotherapy for approximately 9 years at another allergy office and no longer shows sensitivity to environmental allergens. 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 Joya Gaskins who is an ENT specialist. She has received Botox injections into her vocal cords periodically through her ENT specialist.  April Church's asthma has been well controlled. She has required rescue medication once a week for wheezing at nighttime. She has not experienced nocturnal awakenings due to lower respiratory symptoms, nor have activities of daily living been limited. She has required no Emergency Department or Urgent Care visits for her asthma. She has required zero courses of systemic steroids for asthma exacerbations since the last visit. She is currently using Flovent 110, 2 puffs in the morning and 2 puffs in the evening and Proventil for her rescue inhaler.  April Church reports her rhinitis is not well controlled as she is sneezing every  morning and frequently has a runny nose in the afternoon. She uses Astelin nasal spray in the morning and fluticasone nasal spray in evening as well as taking an Allegra daily. She occasionally takes Benadryl at nighttime which relieves her runny nose.  She reports reflux as well controlled with only an occasional bout of heartburn. She takes ranitidine 150 mg every morning. She received her last Botox injection 3 weeks ago.   She continues to avoid shellfish and wine and currently has been Auvi-Q that is within date and she has not needed to use.   Drug Allergies:  Allergies  Allergen Reactions  . Shellfish Allergy Shortness Of Breath  . Duloxetine Itching and Rash  . Sulfa Drugs Cross Reactors Anxiety and Rash  . Codeine Anxiety    Physical Exam: BP 112/76 (BP Location: Left Arm, Patient Position: Sitting, Cuff Size: Normal)   Pulse 96   Resp 16    Physical Exam  Constitutional: She is oriented to person, place, and time. She appears well-developed and well-nourished.  HENT:  Right Ear: External ear normal.  Left Ear: External ear normal.  Eyes normal. Ears normal. Nares slightly erythematous. Pharynx normal  Eyes: Conjunctivae are normal.  Neck: Normal range of motion. Neck supple.  Pulmonary/Chest:  Lungs clear to auscultation  Musculoskeletal: Normal range of motion.  Neurological: She is alert and oriented to person, place, and time.  Skin: Skin is warm and dry.  Psychiatric: She has a normal mood and affect. Her behavior is normal.    Diagnostics: FEV1: 1.17, FVC: 1.52. Predicted FEV1 1.77, predicted FVC 2.38. Mild restriction noted. Spirometry is consistent with  previous visit.    Assessment and Plan: 1. Anaphylaxis, subsequent encounter   2. Chronic nonallergic rhinitis   3. Mild persistent asthma, uncomplicated   4. Vocal fold paralysis, bilateral   5. Gastroesophageal reflux disease, esophagitis presence not specified     Meds ordered this encounter    Medications  . fluticasone (FLOVENT HFA) 110 MCG/ACT inhaler    Sig: Inhale 2 puffs 2 (two) times daily into the lungs.    Dispense:  1 Inhaler    Refill:  3  . Azelastine HCl 0.15 % SOLN    Sig: Place 2 sprays 2 (two) times daily into both nostrils.    Dispense:  30 mL    Refill:  5  . albuterol (PROVENTIL HFA;VENTOLIN HFA) 108 (90 Base) MCG/ACT inhaler    Sig: Inhale 1 puff every 6 (six) hours as needed into the lungs for wheezing or shortness of breath.    Dispense:  1 Inhaler    Refill:  3    Patient Instructions  Anaphylaxis, subsequent encounter Continue avoidance of shellfish and wine Have access to self injectable epinephrine in case of allergic reaction. Follow emergency action plan in case of allergic reaction Continue Allegra 180 mg daily  Chronic nonallergic rhinitis Allegra as above Continue Astelin. Begin 1 spray in each nostril in the morning and 1 spray in each nostril in the evening. May stop fluticasone nasal spray if your nose is not stuffy.  Mild persistent asthma, uncomplicated Continue Flovent 110, 2puffs in the a.m. and 2 puffs in the p.m. Continue Proventil HFA 1 puff into the lungs every 6 hours as needed for shortness of breath or wheezing  Vocal fold paralysis, bilateral Continue routine follow-up with Dr. Joya Gaskins with ENT for periodic Botox injections  GERD Continue ranitidine 150 in the am  Follow up in 6 months    Return in about 6 months (around 06/28/2017), or if symptoms worsen or fail to improve.    Thank you for the opportunity to care for this patient.  Please do not hesitate to contact me with questions.  Gareth Morgan, FNP [facility]

## 2017-01-04 DIAGNOSIS — M7541 Impingement syndrome of right shoulder: Secondary | ICD-10-CM | POA: Diagnosis not present

## 2017-01-05 ENCOUNTER — Ambulatory Visit: Payer: Medicare Other | Admitting: Allergy

## 2017-01-07 ENCOUNTER — Encounter: Payer: Self-pay | Admitting: Adult Health

## 2017-01-07 ENCOUNTER — Encounter: Payer: Medicare Other | Admitting: Adult Health

## 2017-01-07 ENCOUNTER — Ambulatory Visit (HOSPITAL_BASED_OUTPATIENT_CLINIC_OR_DEPARTMENT_OTHER): Payer: Medicare Other | Admitting: Adult Health

## 2017-01-07 VITALS — BP 139/90 | HR 104 | Temp 97.9°F | Resp 18 | Ht 60.5 in | Wt 128.7 lb

## 2017-01-07 DIAGNOSIS — Z170 Estrogen receptor positive status [ER+]: Principal | ICD-10-CM

## 2017-01-07 DIAGNOSIS — I10 Essential (primary) hypertension: Secondary | ICD-10-CM | POA: Diagnosis not present

## 2017-01-07 DIAGNOSIS — Z853 Personal history of malignant neoplasm of breast: Secondary | ICD-10-CM | POA: Diagnosis not present

## 2017-01-07 DIAGNOSIS — C50512 Malignant neoplasm of lower-outer quadrant of left female breast: Secondary | ICD-10-CM

## 2017-01-07 NOTE — Progress Notes (Signed)
CLINIC:  Survivorship   REASON FOR VISIT:  Routine follow-up for history of breast cancer.   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      07/23/2009 - 09/03/2009 Radiation Therapy    Adjuvant radiation therapy      10/13/2009 - 10/15/2013 Anti-estrogen oral therapy    Initially letrozole 2.5 mg daily 2 years then switched to tamoxifen then once again switched the Arimidex October 2013        INTERVAL HISTORY:  Ms. Mckellips presents to the [facility] today for routine follow-up for her history of breast cancer.  Overall, she reports feeling quite well. She has no issues today.  She has a PCP she sees regularly.  She is up to date with her cancer screenings.  She gets some exercise, but admits it is not as much as she should get.      REVIEW OF SYSTEMS:  Review of Systems  Constitutional: Negative for appetite change, chills, fatigue and unexpected weight change.  HENT:   Negative for hearing loss and lump/mass.   Eyes: Negative for eye problems and icterus.  Respiratory: Negative for chest tightness, cough and shortness of breath.   Cardiovascular: Negative for chest pain, leg swelling and palpitations.  Gastrointestinal: Negative for abdominal distention and abdominal pain.  Endocrine: Negative for hot flashes.  Skin: Negative for itching and rash.  Neurological: Negative for dizziness, extremity weakness and headaches.  Hematological: Negative for adenopathy. Does not bruise/bleed easily.  Breast: Denies any new nodularity, masses, tenderness, nipple changes, or nipple discharge.       PAST MEDICAL/SURGICAL HISTORY:  Past Medical History:  Diagnosis Date  . Asthma    daily inhaler  . Breast cancer ([place])    left- radiation and surgery -dx. 2011- no further tx. now- [name] , Dr.  Valere Dross  . Cyst of finger 11/2011   annular cyst right long finger  . Dental crowns present   . Dermatitis   . Frequency of urination   . GERD (gastroesophageal reflux disease)   . Hemorrhoid   . Hyperlipidemia   . Hypertension    under control, has been on med. x 2 yrs.  [name] PONV (postoperative nausea and vomiting)   . Seasonal allergies   . Tremors of nervous system    hands  . Trigger finger of right hand 11/2011   long finger   Past Surgical History:  Procedure Laterality Date  . ANTERIOR CERVICAL DECOMPRESSION/DISCECTOMY FUSION 4 LEVELS N/A 11/14/2013   Performed by Newman Pies, MD at [facility] NEURO ORS  . APPENDECTOMY  age 72  . BREAST LUMPECTOMY  06/16/2009   left; SLN bx.  [name] BREAST LUMPECTOMY  07/01/2009   re-excision  . BREAST SURGERY  1999   reduction  . CATARACT EXTRACTION, BILATERAL    . COLONOSCOPY WITH PROPOFOL N/A 12/03/2014   Performed by [name], MD at [facility]  . FOOT SURGERY  06/22/11   left  . KNEE ARTHROSCOPY  03/12/2005   right  . KNEE ARTHROSCOPY     left  . NASAL SINUS SURGERY     x 2  . 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 RELEASE TRIGGER FINGER/A-1 PULLEY Right 12/16/2011   Performed by Tennis Must, MD at [facility]  .  TUMOR EXCISION  age 62   right arm     ALLERGIES:  Allergies  Allergen Reactions  . Shellfish Allergy Shortness Of Breath  . Duloxetine Itching and Rash  . Sulfa Drugs Cross Reactors Anxiety and Rash  . Codeine Anxiety     CURRENT MEDICATIONS:  Outpatient Encounter Medications as of 01/07/2017  Medication Sig Note  . albuterol (PROVENTIL HFA;VENTOLIN HFA) 108 (90 Base) MCG/ACT inhaler Inhale 1 puff every 6 (six) hours as needed into the lungs for wheezing or shortness of breath.   [name] amLODipine (NORVASC) 5 MG tablet Take 1 tablet (5 mg total) by mouth 2 (two) times daily.   [name] aspirin 81 MG tablet Take 81 mg by mouth daily.     [name] atorvastatin (LIPITOR) 20  MG tablet Take 20 mg by mouth every other day.  05/10/2016: 05/10/16 taking twice a day per PCP  . Azelastine HCl 0.15 % SOLN Place 2 sprays 2 (two) times daily into both nostrils.   [name] CALCIUM CARBONATE PO Take 1 tablet by mouth 2 (two) times daily.   . carbidopa-levodopa (SINEMET IR) 25-100 MG tablet Take 1 tablet by mouth 3 (three) times daily before meals.   . Cholecalciferol (VITAMIN D) 1000 UNITS capsule Take 1,000 Units by mouth 2 (two) times daily.    . Coenzyme Q10 (CO Q 10) 100 MG CAPS Take by mouth.   . cyclobenzaprine (FLEXERIL) 5 MG tablet Take 1 tablet (5 mg total) by mouth at bedtime.   [name] EPINEPHrine 0.3 mg/0.3 mL IJ SOAJ injection as needed. 11/12/2015: Received from: [facility] Received Sig: Use as directed.  . fexofenadine (ALLEGRA) 180 MG tablet Take 180 mg by mouth daily.   . fluticasone (FLONASE) 50 MCG/ACT nasal spray Place 1 spray into both nostrils 2 (two) times daily.   . fluticasone (FLOVENT HFA) 110 MCG/ACT inhaler Inhale 2 puffs 2 (two) times daily into the lungs.   [name] ipratropium (ATROVENT) 0.03 % nasal spray Place 2 sprays into both nostrils 2 (two) times daily.   . Omega-3 Fatty Acids (FISH OIL) 1200 MG CAPS Take 1,200 mg by mouth 2 (two) times daily.    Vladimir Faster Glycol-Propyl Glycol (SYSTANE OP) Place 1 drop into both eyes 2 (two) times daily.   . primidone (MYSOLINE) 50 MG tablet Take 2 tablets (100 mg total) by mouth 3 (three) times daily.   . ranitidine (ZANTAC) 150 MG capsule Take 150 mg by mouth daily after breakfast.    . triamcinolone cream (KENALOG) 0.1 % Apply 1 application topically 2 (two) times daily as needed (FOR RASH).    [name] venlafaxine (EFFEXOR) 37.5 MG tablet Take 1 tablet (37.5 mg total) by mouth daily.    No facility-administered encounter medications on file as of 01/07/2017.      ONCOLOGIC FAMILY HISTORY:  Family History  Problem Relation Age of Onset  . Kidney disease Mother   . Stroke Father   . Stroke Sister         08/2015  . Diabetes Brother   . Heart disease Brother   . Cancer Sister       SOCIAL HISTORY:  ALANTRA POPOCA is married and lives with her husband in [place], [place].  She has a daughter who lives in [place], [place].  Ms. Scobey is currently retired.  She denies any current or history of tobacco, alcohol, or illicit drug use.     PHYSICAL EXAMINATION:  Vital Signs: Vitals:   01/07/17 1224  BP: 139/90  Pulse: (!) 104  Resp: 18  Temp: 97.9 F (36.6 C)  SpO2: 97%   Filed Weights   01/07/17 1224  Weight: 128 lb 11.2 oz (58.4 kg)   General: Well-nourished, well-appearing female in no acute distress.  Unaccompanied today.   HEENT: Head is normocephalic.  Pupils equal and reactive to light. Conjunctivae clear without exudate.  Sclerae anicteric. Oral mucosa is pink, moist.  Oropharynx is pink without lesions or erythema.  Lymph: No cervical, supraclavicular, or infraclavicular lymphadenopathy noted on palpation.  Cardiovascular: Regular rate and rhythm.[name] Respiratory: Clear to auscultation bilaterally. Chest expansion symmetric; breathing non-labored.  Breast Exam:  -Left breast: No appreciable masses on palpation. No skin redness, thickening, or peau d'orange appearance; no nipple retraction or nipple discharge; mild distortion in symmetry at previous lumpectomy site well healed scar without erythema or nodularity.  -Right breast: No appreciable masses on palpation. No skin redness, thickening, or peau d'orange appearance; no nipple retraction or nipple discharge;  -Axilla: No axillary adenopathy bilaterally.  GI: Abdomen soft and round; non-tender, non-distended. Bowel sounds normoactive. No hepatosplenomegaly.   GU: Deferred.  Neuro: No focal deficits. Steady gait.  Psych: Mood and affect normal and appropriate for situation.  MSK: No focal spinal tenderness to palpation, full range of motion in bilateral upper extremities Extremities: No edema. Skin: Warm and  dry.  LABORATORY DATA:  None for this visit   DIAGNOSTIC IMAGING:  Most recent mammogram:   Normal, done at [facility] in 02/2016   ASSESSMENT AND PLAN:  Ms.. Cockerill is a pleasant 76 y.o. female with history of Stage IA left breast invasive ductal carcinoma, ER+/PR+/HER2-, diagnosed in 05/2009, treated with lumpectomy, adjuvant radiation therapy, and anti-estrogen therapy with Letrozole x 2years followed by  Tamoxifen, then Anastrozole completing therapy in 09/2013.  She [facility] for surveillance and routine follow-up.   1. History of breast cancer:  Ms. Eckstein is currently clinically and radiographically without evidence of disease or recurrence of breast cancer. She will be due for mammogram in 02/2017   I encouraged her to call me with any questions or concerns before her next visit at the cancer center, and I would be happy to see her sooner, if needed.    2 Bone health:  Given Ms. Leclaire's age, history of breast cancer, she is at risk for bone demineralization. She was given education on specific food and activities to promote bone health.  3. Cancer screening:  Due to Ms. Bonneau's history and her age, she should receive screening for skin cancers, colon cancer, and gynecologic cancers. She was encouraged to follow-up with her PCP for appropriate cancer screenings.   4. Health maintenance and wellness promotion: Ms. Sher was encouraged to consume 5-7 servings of fruits and vegetables per day. She was also encouraged to engage in moderate to vigorous exercise for 30 minutes per day most days of the week. She was instructed to limit her alcohol consumption and continue to abstain from tobacco use.    Dispo:  -Return to cancer center in one year for lts follow up -Mammogram in 02/2017   A total of (30) minutes of face-to-face time was spent with this patient with greater than 50% of that time in counseling and care-coordination.   Gardenia Phlegm,  NP Survivorship Program [facility] [phone]   Note: PRIMARY CARE PROVIDER Leeroy Cha, Frannie [phone]

## 2017-01-14 ENCOUNTER — Telehealth: Payer: Self-pay | Admitting: Adult Health

## 2017-01-14 NOTE — Telephone Encounter (Signed)
Spoke to patient regarding upcoming November 2019 appointments. Mailed calendar to patient.

## 2017-01-18 ENCOUNTER — Ambulatory Visit: Payer: Medicare Other | Admitting: Occupational Therapy

## 2017-01-18 ENCOUNTER — Ambulatory Visit: Payer: Medicare Other | Admitting: Physical Therapy

## 2017-01-18 ENCOUNTER — Telehealth: Payer: Self-pay | Admitting: Occupational Therapy

## 2017-01-18 ENCOUNTER — Ambulatory Visit: Payer: Medicare Other | Attending: Diagnostic Neuroimaging

## 2017-01-18 DIAGNOSIS — R278 Other lack of coordination: Secondary | ICD-10-CM | POA: Insufficient documentation

## 2017-01-18 DIAGNOSIS — R1312 Dysphagia, oropharyngeal phase: Secondary | ICD-10-CM | POA: Insufficient documentation

## 2017-01-18 DIAGNOSIS — G20 Parkinson's disease: Secondary | ICD-10-CM

## 2017-01-18 DIAGNOSIS — R2689 Other abnormalities of gait and mobility: Secondary | ICD-10-CM

## 2017-01-18 NOTE — Therapy (Signed)
[facility] [address] [place], [place], [address] Phone: [phone]   Fax:  [phone]  Patient Details  Name: April Church MRN: [id number] Date of Birth: 08/08/1940 Referring Provider:  Penni Bombard, MD  Encounter Date: 01/18/2017  Speech Therapy Parkinson's Disease Screen   Decibel Level today: low 70s dB  (WNL=70-72 dB) with sound level meter 30cm away from pt's mouth. Pt's conversational volume has remained essentially the same since last screening. Pt's vocal quality is better; pt reports botox injection in September.  Pt is still experiencing difficulty in swallowing warranting an objective swallowing evaluation. She reports consistent coughing with liquids. No overt s/s aspiration PNA were reported nor observed today. Her last modified barium swallow evaluation was in July 2017.   *Pt would benefit from modified barium swallow eval - please order via EPIC or call [phone] to schedule.Garald Balding ,[place], CCC-SLP  01/18/2017, 4:44 PM  [facility] [address] [place], [place], [address] Phone: [phone]   Fax:  [phone]

## 2017-01-18 NOTE — Telephone Encounter (Signed)
Order placed. -VRP 

## 2017-01-18 NOTE — Therapy (Signed)
[facility] [address] [place], [place], [address] Phone: [phone]   Fax:  [phone]  Patient Details  Name: SIRA ADSIT MRN: [id number] Date of Birth: 1940/11/20 Referring Provider:  Leeroy Cha,*  Encounter Date: 01/18/2017  Occupational Therapy Parkinson's Disease Screen  Hand dominance:  right   9-hole peg test:    RUE  35.47 sec        LUE  29.07 sec  Box & Blocks Test:   RUE  39 blocks        LUE  35 blocks . Change in ability to perform ADLs/IADLs:  yes  Other Comments:  Mild tremor in handwriting, but good legibility with name/simple phrase (but pt reports inconsistent), difficulty picking up card/coin from the floor.  R shoulder pain/ache at scapula (seen by Ortho MD and received cortisone injection due to bursitis).  Shoulder hurts in the morning.    Pt would benefit from occupational therapy evaluation due to  R shoulder pain/stiffness, slowed functional reaching/coordination (box and blocks measures) and pt reports of incr difficulty performing activities with hands.   [facility] 01/18/2017, 1:08 PM  [facility] [address] [place], [place], [address] Phone: [phone]   Fax:  [place], OTR/L [facility] [address] [place], [place]  [address] [phone] phone [phone] 01/18/17 4:13 PM

## 2017-01-18 NOTE — Therapy (Signed)
[facility] [address] [place], [place], [address] Phone: [phone]   Fax:  [phone]  Patient Details  Name: April Church MRN: [id number] Date of Birth: September 04, 1940 Referring Provider:  Leeroy Cha,*  Encounter Date: 01/18/2017  Physical Therapy Parkinson's Disease Screen   Timed Up and Go test: 13.12 sec  10 meter walk test: 10 sec (3.28 ft/sec)  5 time sit to stand test: 17.97 sec    Patient does not require Physical Therapy services at this time.  Recommend Physical Therapy screen in 6-9 months.     Eragon Hammond W. 01/18/2017, 1:44 PM  Frazier Butt., PT   [facility] [address] [place], [place], [address] Phone: [phone]   Fax:  [phone]

## 2017-01-18 NOTE — Telephone Encounter (Signed)
Dr. Leta Baptist,   Ardis Hughs was seen for Parkinsons OT, PT, and ST screens 01/18/17.   Pt may benefit from OT referral due to R shoulder pain/stiffness, slowed functional reaching/coordination (per box and blocks measures) and pt reports of incr difficulty performing activities with hands.  If you are in agreement, please send order for Occupational therapy via epic.  Thank you,  Vianne Bulls, OTR/L [facility] [address] [place], [place]  [address] [phone] phone [phone] 01/18/17 4:17 PM

## 2017-01-20 DIAGNOSIS — M5412 Radiculopathy, cervical region: Secondary | ICD-10-CM | POA: Diagnosis not present

## 2017-01-20 DIAGNOSIS — M7541 Impingement syndrome of right shoulder: Secondary | ICD-10-CM | POA: Diagnosis not present

## 2017-01-24 DIAGNOSIS — M7541 Impingement syndrome of right shoulder: Secondary | ICD-10-CM | POA: Diagnosis not present

## 2017-01-25 ENCOUNTER — Ambulatory Visit: Payer: Medicare Other | Attending: Diagnostic Neuroimaging | Admitting: Occupational Therapy

## 2017-01-25 ENCOUNTER — Encounter: Payer: Self-pay | Admitting: Occupational Therapy

## 2017-01-25 DIAGNOSIS — R29898 Other symptoms and signs involving the musculoskeletal system: Secondary | ICD-10-CM | POA: Diagnosis not present

## 2017-01-25 DIAGNOSIS — R2689 Other abnormalities of gait and mobility: Secondary | ICD-10-CM

## 2017-01-25 DIAGNOSIS — M6281 Muscle weakness (generalized): Secondary | ICD-10-CM

## 2017-01-25 DIAGNOSIS — R251 Tremor, unspecified: Secondary | ICD-10-CM | POA: Diagnosis not present

## 2017-01-25 DIAGNOSIS — M25511 Pain in right shoulder: Secondary | ICD-10-CM | POA: Diagnosis not present

## 2017-01-25 DIAGNOSIS — R293 Abnormal posture: Secondary | ICD-10-CM

## 2017-01-25 DIAGNOSIS — R2681 Unsteadiness on feet: Secondary | ICD-10-CM

## 2017-01-25 DIAGNOSIS — R29818 Other symptoms and signs involving the nervous system: Secondary | ICD-10-CM | POA: Diagnosis not present

## 2017-01-25 DIAGNOSIS — R278 Other lack of coordination: Secondary | ICD-10-CM | POA: Diagnosis not present

## 2017-01-25 NOTE — Therapy (Signed)
[facility] [address] [place], [place], [address] Phone: [phone]   Fax:  [phone]  Occupational Therapy Evaluation  Patient Details  Name: April Church MRN: [id number] Date of Birth: Jul 06, 1940 Referring Provider: Dr. Leta Baptist   Encounter Date: 01/25/2017  OT End of Session - 01/25/17 2307    Visit Number  1    Number of Visits  17    Date for OT Re-Evaluation  03/26/17    Authorization Type  Medicare/BCBS, need g-code    Authorization Time Period  cert. dates 01/25/17-03/26/17    Authorization - Visit Number  1    Authorization - Number of Visits  10    OT Start Time  2458    OT Stop Time  1240    OT Time Calculation (min)  55 min    Activity Tolerance  Patient tolerated treatment well    Behavior During Therapy  WFL for tasks assessed/performed       Past Medical History:  Diagnosis Date  . Asthma    daily inhaler  . Breast cancer ([place])    left- radiation and surgery -dx. 2011- no further tx. now- [name] , [name]  . Cyst of finger 11/2011   annular cyst right long finger  . Dental crowns present   . Dermatitis   . Frequency of urination   . GERD (gastroesophageal reflux disease)   . Hemorrhoid   . Hyperlipidemia   . Hypertension    under control, has been on med. x 2 yrs.  [name] PONV (postoperative nausea and vomiting)   . Seasonal allergies   . Tremors of nervous system    hands  . Trigger finger of right hand 11/2011   long finger    Past Surgical History:  Procedure Laterality Date  . ANTERIOR CERVICAL DECOMPRESSION/DISCECTOMY FUSION 4 LEVELS N/A 11/14/2013   Procedure: ANTERIOR CERVICAL DECOMPRESSION/DISCECTOMY FUSION 4 LEVELS;  Surgeon: Newman Pies, MD;  Location: [facility];  Service: Neurosurgery;  Laterality: N/A;  C34 C45 C56 C67 anterior cervical fusion with interbody prosthesis plating and  bonegraft  . APPENDECTOMY  age 40  . BREAST LUMPECTOMY  06/16/2009    left; SLN bx.  [name] BREAST LUMPECTOMY  07/01/2009   re-excision  . BREAST SURGERY  1999   reduction  . CATARACT EXTRACTION, BILATERAL    . COLONOSCOPY WITH PROPOFOL N/A 12/03/2014   Procedure: COLONOSCOPY WITH PROPOFOL;  Surgeon: [name], MD;  Location: [facility];  Service: Endoscopy;  Laterality: N/A;  . FOOT SURGERY  06/22/11   left  . KNEE ARTHROSCOPY  03/12/2005   right  . KNEE ARTHROSCOPY     left  . NASAL SINUS SURGERY     x 2  . 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 TRIGGER FINGER RELEASE  12/16/2011   Procedure: RELEASE TRIGGER FINGER/A-1 PULLEY;  Surgeon: Tennis Must, MD;  Location: [facility];  Service: Orthopedics;  Laterality: Right;  RIGHT LONG FINGER TRIGGER RELEASE & ANNULAR CYST EXCISION  . TUMOR EXCISION  age 58   right arm    There were no vitals filed for this visit.  Subjective Assessment - 01/25/17 1208    Subjective   Pt reports that she had nerve conduction test for RUE yesterday with noted wasting of R hyperthenar eminance per pt.  Pt also reports MRI scheduled for Thursday.  Had cortisone injection to R shoulder approx 6 weeks ago  with no improvement.    Pertinent History  Parkinson's disease, essential tremor, hx of L breast CA s/p lumpectomy/radiation in 2011, HTN, GERD, hyperlipidema, ?bursitis R shoulder, hx of R elbow tumor removal, hx of mild scoliosis per pt, hx of R 3rd digit trigger finger release and cyst removal    Patient Stated Goals  improve coordination for ADLs    Currently in Pain?  No/denies    Pain Score  -- 0-10/10, none current    Pain Location  Scapula    Pain Orientation  Right    Pain Descriptors / Indicators  Burning;Aching    Pain Type  Acute pain    Pain Onset  More than a month ago    Pain Frequency  Intermittent    Aggravating Factors   unknown    Pain Relieving Factors  tylenol, heating pad        [facility] OT Assessment - 01/25/17 0001      Assessment    Diagnosis  Parkinson's disease    Referring Provider  Dr. Leta Baptist    Onset Date  01/19/17 OT PD screen    Prior Therapy  OT      Precautions   Precautions  Fall      Balance Screen   Has the patient fallen in the past 6 months  No      Home  Environment   Family/patient expects to be discharged to:  Private residence    Additional Comments  spouse with medical problems so that pt has to assist at times, perform all household responsibilities, spouse cannot perform heavy lifting     Lives With  Spouse      Prior Function   Level of Independence  Independent      ADL   Eating/Feeding  -- min spills, uses spoon more, cup w/BUEs    Grooming  Modified independent min difficulty with jewerly     Upper Body Bathing  Modified independent    Lower Body Bathing  Modified independent    Upper Body Dressing  Increased time difficulty with buttons    Lower Body Dressing  Increased time    Toilet Transfer  Modified independent    Toileting - Clothing Manipulation  Modified independent    [place] Transfer  Modified independent    ADL comments  difficulty opening packages, uses bottle opener, drops lightweight items      IADL   Shopping  Takes care of all shopping needs independently    Light Housekeeping  Performs light daily tasks such as dishwashing, bed making has cleaning person for heavier tasks    Meal Prep  Plans, prepares and serves adequate meals independently    Investment banker, corporate own vehicle    Medication Management  Is responsible for taking medication in correct dosages at correct time difficulty picking up small pills or credit card    Financial Management  -- does most of bill paying online      Mobility   Mobility Status  Independent      Written Expression   Dominant Hand  Right    Handwriting  -- fluctuates per pt with tremors    Effective Techniques  -- good legibility for name/phrase      Vision - History    Baseline Vision  No visual deficits per pt       Cognition   Overall Cognitive Status  Within Functional Limits for tasks assessed  Observation/Other Assessments   Standing Functional Reach Test  R-6", L-7"    Simulated Eating Time (seconds)  10.25sec with gross grasp    Donning Doffing Jacket Time (seconds)  18.0sec with no trunk rotation    Donning Doffing Jacket Comments  3 button/ unbutton: 29.19 secs      Posture/Postural Control   Posture/Postural Control  Postural limitations    Postural Limitations  Rounded Shoulders    Posture Comments  Pt reports mild scoliosis, pt with R scapular winging/elevation      Sensation   Light Touch  Appears Intact    Additional Comments  pt denies numbness/tingling drops light objects      Coordination   Right 9 Hole Peg Test  32.56    Left 9 Hole Peg Test  25.65    Box and Blocks  RUE 41 blocks  LUE 46 blocks    Tremors  RUE mild resting and action noted, L mild resting tremor      Tone   Assessment Location  Left Upper Extremity      ROM / Strength   AROM / PROM / Strength  AROM      AROM   Overall AROM   Within functional limits for tasks performed      Hand Function   Right Hand Grip (lbs)  28    Right Hand Lateral Pinch  7 lbs    Right Hand 3 Point Pinch  4 lbs    Left Hand Grip (lbs)  35    Left Hand Lateral Pinch  12 lbs    Left 3 point pinch  9 lbs      RUE Tone   RUE Tone  Mild      LUE Tone   LUE Tone  Mild                      OT Education - 01/25/17 2251    Education Details  OT eval results/POC; how PD can affect shoulder movement and pain; difference between essential tremor and typical PD tremor; typical PD diagnosis process; Recommended supporting RUE with pillow when laying on L side to decr pain/improve positioning    Person(s) Educated  Patient    Methods  Explanation    Comprehension  Verbalized understanding       OT Short Term Goals - 01/25/17 2320      OT SHORT TERM GOAL #1    Title  Pt will be independent with updated HEP--check STGs 02/23/17.    Time  4    Period  Weeks    Status  New      OT SHORT TERM GOAL #2   Title  Pt will improve R grip strength by at least 5lbs for improved ability to hold objects/open containers.    Baseline  R-28lbs    Time  4    Period  Weeks    Status  New      OT SHORT TERM GOAL #3   Title  Pt will improve R lateral and 3point pinch strengths by at least 3lbs each for ADLs.    Baseline  R lateral-7lbs, R 3point 4lbs.    Time  4    Period  Weeks    Status  New      OT SHORT TERM GOAL #4   Title  Pt will improve ability to dress and prevent future complications by improving PPT#4 by at least 3sec and demo trunk rotation.  Baseline  18sec with decr trunk rotation    Time  4    Period  Weeks    Status  New      OT SHORT TERM GOAL #5   Title  Pt will report pain in R scapula consistently less than or equal to 5/10.    Baseline  up to 10/10 at times    Time  4    Period  Weeks    Status  New        OT Long Term Goals - 01/25/17 2325      OT LONG TERM GOAL #1   Title  Pt will verbalize understanding of strategies/AE to incr ease with ADLs (including writing, buttoning/dressing, picking up objects).--check LTGs 03/26/17.    Time  8    Period  Weeks    Status  New      OT LONG TERM GOAL #2   Title  Pt will improve coordination for ADLs as shown by improving time on 9-hole peg test by at least 4sec with R hand.    Baseline  32.56sec    Time  8    Period  Weeks    Status  New      OT LONG TERM GOAL #3   Title  Pt will improve coordination/functional reaching for ADLs as shown by improving score on box and blocks with RUE by at least 4 blocks    Baseline  RUE 41 blocks    Time  8    Period  Weeks    Status  New      OT LONG TERM GOAL #4   Title  Pt will fasten/unfasten 3 buttons on tabletop in 326 sec or less.    Baseline  29.19sec    Time  8    Period  Weeks    Status  New      OT LONG TERM GOAL #5    Title  Pt will improve balance/functional reaching for ADLs/IADLs as shown by improving standing functional reach by at least 2 inches bilaterally.    Baseline  R-6", L-7"    Time  8    Period  Weeks    Status  New            Plan - 01/25/17 2308    Clinical Impression Statement  Pt presents today with bradykinesia, rigidity, tremor, decr posture, R scapula pain, decr coordination, decr strength R hand, decr balance/functional mobility, and decr UE functional use.  Pt would benefit from occupational therapy to address these deficits for incr ease/efficiency with ADLs/IADLs, prevent future complications, and address new complaints of R shoulder pain and hand weakness as able, and improve RUE functional use    Occupational Profile and client history currently impacting functional performance  Pt is a 76 y.o. female with diagnosis of Parkinson's disease.  PMH also includes:  essential tremor, hx of L breast CA s/p lumpectomy/radiation in 2011, HTN, GERD, hyperlipidema, ?bursitis R shoulder, hx of R elbow tumor removal, hx of mild scoliosis per pt, hx of R 3rd digit trigger finger release and cyst removal.  Pt reports incr difficulty using hands and picking up items.  Pt also reports dropping items from R hand and scapular pain with gradual onset over last 6 weeks.  Pt is currently being seen for further evaluation of R scapular pain by Ortho MD.   Pt is independent and assists in caring for husband prn.      Occupational performance deficits (Please  refer to evaluation for details):  ADL's;IADL's;Social Participation;Leisure;Rest and Sleep    Rehab Potential  Good    Current Impairments/barriers affecting progress:  R scapular pain with unknown cause    OT Frequency  2x / week    OT Duration  8 weeks +eval    OT Treatment/Interventions  Self-care/ADL training;Moist Heat;Fluidtherapy;DME and/or AE instruction;Splinting;Balance training;Therapeutic activities;Ultrasound;Therapeutic exercise;Cognitive  remediation/compensation;Passive range of motion;Functional Mobility Training;Neuromuscular education;Cryotherapy;Patient/family education;Manual Therapy;Energy conservation;Paraffin;Electrical Stimulation    Plan  see if pt has received MRI results, scapular facilitation RUE with trunk movements, R putty HEP    Clinical Decision Making  Several treatment options, min-mod task modification necessary    Consulted and Agree with Plan of Care  Patient       Patient will benefit from skilled therapeutic intervention in order to improve the following deficits and impairments:  Abnormal gait, Decreased knowledge of use of DME, Impaired flexibility, Decreased coordination, Decreased endurance, Decreased range of motion, Decreased strength, Impaired tone, Decreased balance, Decreased knowledge of precautions, Decreased safety awareness, Impaired perceived functional ability, Impaired UE functional use  Visit Diagnosis: Other symptoms and signs involving the nervous system  Other symptoms and signs involving the musculoskeletal system  Tremor  Abnormal posture  Other abnormalities of gait and mobility  Unsteadiness  Other lack of coordination  Muscle weakness (generalized)  Acute pain of right shoulder  G-Codes - 2017/02/18 Jun 13, 2328    Functional Assessment Tool Used (Outpatient only)  R scapular pain up to 10/10 at times, R 9-hole peg test-32.56sec, R grip strength 28lbs, R lateral pinch strength 7lbs, R 3 point pinch 4lbs, R box and blocks test-41 blocks    Functional Limitation  Carrying, moving and handling objects    Carrying, Moving and Handling Objects Current Status (Z6109)  At least 40 percent but less than 60 percent impaired, limited or restricted    Carrying, Moving and Handling Objects Goal Status (U0454)  At least 1 percent but less than 20 percent impaired, limited or restricted       Problem List Patient Active Problem List   Diagnosis Date Noted  . Anaphylactic syndrome  12/29/2016  . Chronic nonallergic rhinitis 12/29/2016  . Mild persistent asthma, uncomplicated 09/81/1914  . 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 Gastroesophageal reflux disease 12/29/2016  . Cervical spondylosis with myelopathy and radiculopathy 11/14/2013  . Essential tremor 06/13/2013  . Cervical spondylosis without myelopathy 06/13/2013  . Breast cancer of lower-outer quadrant of left female breast [facility]) 09/10/2010    [facility] Feb 18, 2017, 11:32 PM  [facility] [address] [place], [place], [address] Phone: [phone]   Fax:  [phone]  Name: April Church MRN: [id number] Date of Birth: [birth year]   Vianne Bulls, OTR/L [facility] [address] [place], [place]  [address] [phone] phone [phone] 02-18-17 11:32 PM

## 2017-01-27 DIAGNOSIS — M5412 Radiculopathy, cervical region: Secondary | ICD-10-CM | POA: Diagnosis not present

## 2017-02-01 ENCOUNTER — Ambulatory Visit: Payer: Medicare Other | Admitting: Occupational Therapy

## 2017-02-02 DIAGNOSIS — M5030 Other cervical disc degeneration, unspecified cervical region: Secondary | ICD-10-CM | POA: Diagnosis not present

## 2017-02-02 DIAGNOSIS — M5412 Radiculopathy, cervical region: Secondary | ICD-10-CM | POA: Diagnosis not present

## 2017-02-02 DIAGNOSIS — G5601 Carpal tunnel syndrome, right upper limb: Secondary | ICD-10-CM | POA: Diagnosis not present

## 2017-02-07 ENCOUNTER — Encounter: Payer: Medicare Other | Admitting: Occupational Therapy

## 2017-02-10 ENCOUNTER — Encounter: Payer: Medicare Other | Admitting: Occupational Therapy

## 2017-02-21 ENCOUNTER — Ambulatory Visit: Payer: Medicare Other | Admitting: Occupational Therapy

## 2017-02-21 ENCOUNTER — Encounter: Payer: Self-pay | Admitting: Occupational Therapy

## 2017-02-21 DIAGNOSIS — R293 Abnormal posture: Secondary | ICD-10-CM | POA: Diagnosis not present

## 2017-02-21 DIAGNOSIS — R251 Tremor, unspecified: Secondary | ICD-10-CM

## 2017-02-21 DIAGNOSIS — R2681 Unsteadiness on feet: Secondary | ICD-10-CM

## 2017-02-21 DIAGNOSIS — R29818 Other symptoms and signs involving the nervous system: Secondary | ICD-10-CM

## 2017-02-21 DIAGNOSIS — R278 Other lack of coordination: Secondary | ICD-10-CM

## 2017-02-21 DIAGNOSIS — R2689 Other abnormalities of gait and mobility: Secondary | ICD-10-CM | POA: Diagnosis not present

## 2017-02-21 DIAGNOSIS — R29898 Other symptoms and signs involving the musculoskeletal system: Secondary | ICD-10-CM

## 2017-02-21 DIAGNOSIS — M6281 Muscle weakness (generalized): Secondary | ICD-10-CM

## 2017-02-21 NOTE — Addendum Note (Signed)
Addended by: Vianne Bulls D on: 02/21/2017 02:39 PM   Modules accepted: Orders

## 2017-02-21 NOTE — Patient Instructions (Addendum)
1. Grip Strengthening (Resistive Putty)   Squeeze putty using thumb and all fingers. Repeat 20 times. Do 1 sessions per day.   Extension (Assistive Putty)   Roll putty back and forth, being sure to use all fingertips. Repeat 2-3 times. Do 1 sessions per day.  Then pinch as below.   Palmar Pinch Strengthening (Resistive Putty)   Pinch putty between thumb and each fingertip in turn after rolling out    Finger and Thumb Extension (Resistive Putty)   With thumb and all fingers in center of putty donut, stretch out. Repeat 5 times. Do 1 sessions per day.     MP Flexion (Resistive Putty)   Bending only at large knuckles, press putty down against thumb. Keep fingertips straight. Repeat 5 times. Do 1 sessions per day.    Lateral Pinch Strengthening (Resistive Putty)    Squeeze between thumb and side of each finger in turn. Repeat 10 times. Do 1 sessions per day.

## 2017-02-21 NOTE — Therapy (Addendum)
[facility] [address] [place], [place], [address] Phone: [phone]   Fax:  [phone]  Occupational Therapy Treatment  Patient Details  Name: April Church MRN: [id number] Date of Birth: [birth year] Referring Provider (Historical): Dr. Leta Baptist   Encounter Date: 02/21/2017  OT End of Session - 02/21/17 1022    Visit Number  2    Number of Visits  17    Date for OT Re-Evaluation  04/22/17    Authorization Type  Medicare/BCBS, need g-code    Authorization Time Period  cert. dates 02/21/17-04/22/17    Authorization - Visit Number  2    Authorization - Number of Visits  10    OT Start Time  1020    OT Stop Time  1100    OT Time Calculation (min)  40 min    Activity Tolerance  Patient tolerated treatment well    Behavior During Therapy  WFL for tasks assessed/performed       Past Medical History:  Diagnosis Date  . Asthma    daily inhaler  . Breast cancer ([place])    left- radiation and surgery -dx. 2011- no further tx. now- [name] , [name]  . Cyst of finger 11/2011   annular cyst right long finger  . Dental crowns present   . Dermatitis   . Frequency of urination   . GERD (gastroesophageal reflux disease)   . Hemorrhoid   . Hyperlipidemia   . Hypertension    under control, has been on med. x 2 yrs.  [name] PONV (postoperative nausea and vomiting)   . Seasonal allergies   . Tremors of nervous system    hands  . Trigger finger of right hand 11/2011   long finger    Past Surgical History:  Procedure Laterality Date  . ANTERIOR CERVICAL DECOMPRESSION/DISCECTOMY FUSION 4 LEVELS N/A 11/14/2013   Procedure: ANTERIOR CERVICAL DECOMPRESSION/DISCECTOMY FUSION 4 LEVELS;  Surgeon: Newman Pies, MD;  Location: [facility];  Service: Neurosurgery;  Laterality: N/A;  C34 C45 C56 C67 anterior cervical fusion with interbody prosthesis plating and  bonegraft  . APPENDECTOMY  age 76  . BREAST LUMPECTOMY   06/16/2009   left; SLN bx.  [name] BREAST LUMPECTOMY  07/01/2009   re-excision  . BREAST SURGERY  1999   reduction  . CATARACT EXTRACTION, BILATERAL    . COLONOSCOPY WITH PROPOFOL N/A 12/03/2014   Procedure: COLONOSCOPY WITH PROPOFOL;  Surgeon: [name], MD;  Location: [facility];  Service: Endoscopy;  Laterality: N/A;  . FOOT SURGERY  06/22/11   left  . KNEE ARTHROSCOPY  03/12/2005   right  . KNEE ARTHROSCOPY     left  . NASAL SINUS SURGERY     x 2  . 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 TRIGGER FINGER RELEASE  12/16/2011   Procedure: RELEASE TRIGGER FINGER/A-1 PULLEY;  Surgeon: Tennis Must, MD;  Location: [facility];  Service: Orthopedics;  Laterality: Right;  RIGHT LONG FINGER TRIGGER RELEASE & ANNULAR CYST EXCISION  . TUMOR EXCISION  age 58   right arm    There were no vitals filed for this visit.  Subjective Assessment - 02/21/17 1020    Subjective   Pt reports that her husband is doing well (pt missed appts due to husband having cardiac stent replacement).  Pt had MRI and reports that she will see neurosurgeon 1/10.  Reports burning sensation with laying down on  back or on R side.  Pt reports that she thinks that the pinched nerve is next to shoulder blade.    Pertinent History  Parkinson's disease, essential tremor, hx of L breast CA s/p lumpectomy/radiation in 2011, HTN, GERD, hyperlipidema, ?bursitis R shoulder, hx of R elbow tumor removal, hx of mild scoliosis per pt, hx of R 3rd digit trigger finger release and cyst removal    Patient Stated Goals  improve coordination for ADLs    Currently in Pain?  No/denies    Pain Onset  More than a month ago        Recommended pt try to sleep on L side and "hug" pillow with RUE to decr pain and improve positioning.  Pt verbalized understanding.  Placing grooved pegs in pegboard with R hand with min-mod difficulty, particularly with in-hand manipulation.  Min cueing for  shoulder compensation.  Discussed writing strategies including taking frequent breaks, stretching, using built-up grip, and spreading out writing tasks, and writing when meds are working well (pt has cards to write today).  Pt verbalized understanding.  Discussed MRI results (per pt) and that pt sees neurosurgeon 03/03/16.  Pt only reports pain when lying down on R side or on back (burning pain), no pain typically with functional use.                   OT Education - 02/21/17 1034    Education Details  Red putty HEP for R hand    Person(s) Educated  Patient    Methods  Explanation;Demonstration;Verbal cues;Handout    Comprehension  Verbalized understanding;Returned demonstration       OT Short Term Goals - 02/21/17 1434      OT SHORT TERM GOAL #1   Title  Pt will be independent with updated HEP--check STGs  03/23/17.    Time  4    Period  Weeks    Status  New      OT SHORT TERM GOAL #2   Title  Pt will improve R grip strength by at least 5lbs for improved ability to hold objects/open containers.    Baseline  R-28lbs    Time  4    Period  Weeks    Status  New      OT SHORT TERM GOAL #3   Title  Pt will improve R lateral and 3point pinch strengths by at least 3lbs each for ADLs.    Baseline  R lateral-7lbs, R 3point 4lbs.    Time  4    Period  Weeks    Status  New      OT SHORT TERM GOAL #4   Title  Pt will improve ability to dress and prevent future complications by improving PPT#4 by at least 3sec and demo trunk rotation.    Baseline  18sec with decr trunk rotation    Time  4    Period  Weeks    Status  New      OT SHORT TERM GOAL #5   Title  Pt will report pain in R scapula consistently less than or equal to 5/10.    Baseline  up to 10/10 at times    Time  4    Period  Weeks    Status  New        OT Long Term Goals - 02/21/17 1434      OT LONG TERM GOAL #1   Title  Pt will verbalize understanding of strategies/AE to incr ease  with ADLs  (including writing, buttoning/dressing, picking up objects).--check LTGs 04/22/17.    Time  8    Period  Weeks    Status  New      OT LONG TERM GOAL #2   Title  Pt will improve coordination for ADLs as shown by improving time on 9-hole peg test by at least 4sec with R hand.    Baseline  32.56sec    Time  8    Period  Weeks    Status  New      OT LONG TERM GOAL #3   Title  Pt will improve coordination/functional reaching for ADLs as shown by improving score on box and blocks with RUE by at least 4 blocks    Baseline  RUE 41 blocks    Time  8    Period  Weeks    Status  New      OT LONG TERM GOAL #4   Title  Pt will fasten/unfasten 3 buttons on tabletop in 26 sec or less.    Baseline  29.19sec    Time  8    Period  Weeks    Status  New      OT LONG TERM GOAL #5   Title  Pt will improve balance/functional reaching for ADLs/IADLs as shown by improving standing functional reach by at least 2 inches bilaterally.    Baseline  R-6", L-7"    Time  8    Period  Weeks    Status  New            Plan - 02/21/17 1022    Clinical Impression Statement  Pt reports pinched nerve in scapula and is scheduled to see neurosurgeon 03/03/16.  Initiated updated HEP and pt verbalized understanding today.    Occupational Profile and client history currently impacting functional performance  Pt is a 76 y.o. female with diagnosis of Parkinson's disease.  PMH also includes:  essential tremor, hx of L breast CA s/p lumpectomy/radiation in 2011, HTN, GERD, hyperlipidema, ?bursitis R shoulder, hx of R elbow tumor removal, hx of mild scoliosis per pt, hx of R 3rd digit trigger finger release and cyst removal.  Pt reports incr difficulty using hands and picking up items.  Pt also reports dropping items from R hand and scapular pain with gradual onset over last 6 weeks.  Pt is currently being seen for further evaluation of R scapular pain by Ortho MD.   Pt is independent and assists in caring for husband prn.       Occupational performance deficits (Please refer to evaluation for details):  ADL's;IADL's;Social Participation;Leisure;Rest and Sleep    Rehab Potential  Good    Current Impairments/barriers affecting progress:  R scapular pain with unknown cause    OT Frequency  2x / week    OT Duration  8 weeks +eval    OT Treatment/Interventions  Self-care/ADL training;Moist Heat;Fluidtherapy;DME and/or AE instruction;Splinting;Balance training;Therapeutic activities;Ultrasound;Therapeutic exercise;Cognitive remediation/compensation;Passive range of motion;Functional Mobility Training;Neuromuscular education;Cryotherapy;Patient/family education;Manual Therapy;Energy conservation;Paraffin;Electrical Stimulation    Plan  review putty HEP for RUE, coordination HEP; extend goal date/re-cert due to missed visits with inclement weather and husband's surgery    Clinical Decision Making  Several treatment options, min-mod task modification necessary    OT Home Exercise Plan  Education provided:  red putty HEP for RUE    Consulted and Agree with Plan of Care  Patient       Patient will benefit from skilled therapeutic intervention in order to  improve the following deficits and impairments:  Abnormal gait, Decreased knowledge of use of DME, Impaired flexibility, Decreased coordination, Decreased endurance, Decreased range of motion, Decreased strength, Impaired tone, Decreased balance, Decreased knowledge of precautions, Decreased safety awareness, Impaired perceived functional ability, Impaired UE functional use  Visit Diagnosis: Other symptoms and signs involving the nervous system  Other symptoms and signs involving the musculoskeletal system  Tremor  Abnormal posture  Other abnormalities of gait and mobility  Other lack of coordination  Unsteadiness  Muscle weakness (generalized)    Problem List Patient Active Problem List   Diagnosis Date Noted  . 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 Chronic  nonallergic rhinitis 12/29/2016  . Mild persistent asthma, uncomplicated 44/31/5400  . 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 Gastroesophageal reflux disease 12/29/2016  . Cervical spondylosis with myelopathy and radiculopathy 11/14/2013  . Essential tremor 06/13/2013  . Cervical spondylosis without myelopathy 06/13/2013  . Breast cancer of lower-outer quadrant of left female breast [facility]) 09/10/2010    [facility] 02/21/2017, 2:36 PM  [facility] [address] [place], [place], [address] Phone: [phone]   Fax:  [phone]  Name: BABS DABBS MRN: [id number] Date of Birth: [birth year]    Vianne Bulls, OTR/L [facility] [address] [place], [place]  [address] [phone] phone [phone] 02/21/17 2:36 PM

## 2017-02-24 ENCOUNTER — Ambulatory Visit: Payer: Medicare Other | Attending: Diagnostic Neuroimaging | Admitting: Occupational Therapy

## 2017-02-24 ENCOUNTER — Encounter: Payer: Self-pay | Admitting: Occupational Therapy

## 2017-02-24 DIAGNOSIS — R293 Abnormal posture: Secondary | ICD-10-CM | POA: Insufficient documentation

## 2017-02-24 DIAGNOSIS — R2689 Other abnormalities of gait and mobility: Secondary | ICD-10-CM

## 2017-02-24 DIAGNOSIS — M25511 Pain in right shoulder: Secondary | ICD-10-CM | POA: Insufficient documentation

## 2017-02-24 DIAGNOSIS — R278 Other lack of coordination: Secondary | ICD-10-CM | POA: Diagnosis not present

## 2017-02-24 DIAGNOSIS — R2681 Unsteadiness on feet: Secondary | ICD-10-CM | POA: Diagnosis not present

## 2017-02-24 DIAGNOSIS — R29818 Other symptoms and signs involving the nervous system: Secondary | ICD-10-CM | POA: Diagnosis not present

## 2017-02-24 DIAGNOSIS — R29898 Other symptoms and signs involving the musculoskeletal system: Secondary | ICD-10-CM | POA: Insufficient documentation

## 2017-02-24 DIAGNOSIS — R251 Tremor, unspecified: Secondary | ICD-10-CM | POA: Diagnosis not present

## 2017-02-24 DIAGNOSIS — M6281 Muscle weakness (generalized): Secondary | ICD-10-CM | POA: Diagnosis not present

## 2017-02-24 NOTE — Therapy (Signed)
[facility] [address] [place], [place], [address] Phone: [phone]   Fax:  [phone]  Occupational Therapy Treatment  Patient Details  Name: April Church MRN: [id number] Date of Birth: 23-Apr-1940 Referring Provider (Historical): Dr. Leta Baptist   Encounter Date: 02/24/2017  OT End of Session - 02/24/17 1326    Visit Number  3    Number of Visits  17    Date for OT Re-Evaluation  04/22/17    Authorization Type  Medicare/BCBS, need g-code    Authorization Time Period  cert. dates 02/21/17-04/22/17    Authorization - Visit Number  3    Authorization - Number of Visits  10    OT Start Time  1320    OT Stop Time  1405    OT Time Calculation (min)  45 min    Activity Tolerance  Patient tolerated treatment well    Behavior During Therapy  WFL for tasks assessed/performed       Past Medical History:  Diagnosis Date  . Asthma    daily inhaler  . Breast cancer ([place])    left- radiation and surgery -dx. 2011- no further tx. now- [name] , [name]  . Cyst of finger 11/2011   annular cyst right long finger  . Dental crowns present   . Dermatitis   . Frequency of urination   . GERD (gastroesophageal reflux disease)   . Hemorrhoid   . Hyperlipidemia   . Hypertension    under control, has been on med. x 2 yrs.  [name] PONV (postoperative nausea and vomiting)   . Seasonal allergies   . Tremors of nervous system    hands  . Trigger finger of right hand 11/2011   long finger    Past Surgical History:  Procedure Laterality Date  . ANTERIOR CERVICAL DECOMPRESSION/DISCECTOMY FUSION 4 LEVELS N/A 11/14/2013   Procedure: ANTERIOR CERVICAL DECOMPRESSION/DISCECTOMY FUSION 4 LEVELS;  Surgeon: Newman Pies, MD;  Location: [facility];  Service: Neurosurgery;  Laterality: N/A;  C34 C45 C56 C67 anterior cervical fusion with interbody prosthesis plating and  bonegraft  . APPENDECTOMY  age 77  . BREAST LUMPECTOMY   06/16/2009   left; SLN bx.  [name] BREAST LUMPECTOMY  07/01/2009   re-excision  . BREAST SURGERY  1999   reduction  . CATARACT EXTRACTION, BILATERAL    . COLONOSCOPY WITH PROPOFOL N/A 12/03/2014   Procedure: COLONOSCOPY WITH PROPOFOL;  Surgeon: [name], MD;  Location: [facility];  Service: Endoscopy;  Laterality: N/A;  . FOOT SURGERY  06/22/11   left  . KNEE ARTHROSCOPY  03/12/2005   right  . KNEE ARTHROSCOPY     left  . NASAL SINUS SURGERY     x 2  . 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 TRIGGER FINGER RELEASE  12/16/2011   Procedure: RELEASE TRIGGER FINGER/A-1 PULLEY;  Surgeon: Tennis Must, MD;  Location: [facility];  Service: Orthopedics;  Laterality: Right;  RIGHT LONG FINGER TRIGGER RELEASE & ANNULAR CYST EXCISION  . TUMOR EXCISION  age 77   right arm    There were no vitals filed for this visit.  Subjective Assessment - 02/24/17 1321    Subjective   Pt reports that she is unsure if pain is helped by improved by positioning of RUE with sleep    Pertinent History  Parkinson's disease, essential tremor, hx of L breast CA s/p lumpectomy/radiation in 2011, HTN,  GERD, hyperlipidema, ?bursitis R shoulder, hx of R elbow tumor removal, hx of mild scoliosis per pt, hx of R 3rd digit trigger finger release and cyst removal    Patient Stated Goals  improve coordination for ADLs    Currently in Pain?  No/denies    Pain Onset  More than a month ago         Reviewed finger ext and MP flex/palmar adduction with red putty.  Pt needed min-mod cueing for proper positioning/technique.    Recommended pt request Ortho MD and neurosurgeon fax office notes regarding R shoulder pain to OT (provided OT's card).  Pt verbalized understanding/agreement                  OT Education - 02/24/17 1523    Education Details  Coordination HEP, particularly for R hand--see pt instructions    Person(s) Educated  Patient    Methods   Explanation;Demonstration;Verbal cues;Handout    Comprehension  Verbalized understanding;Returned demonstration;Verbal cues required min cueing   min cueing      OT Short Term Goals - 02/21/17 1434      OT SHORT TERM GOAL #1   Title  Pt will be independent with updated HEP--check STGs  03/23/17.    Time  4    Period  Weeks    Status  New      OT SHORT TERM GOAL #2   Title  Pt will improve R grip strength by at least 5lbs for improved ability to hold objects/open containers.    Baseline  R-28lbs    Time  4    Period  Weeks    Status  New      OT SHORT TERM GOAL #3   Title  Pt will improve R lateral and 3point pinch strengths by at least 3lbs each for ADLs.    Baseline  R lateral-7lbs, R 3point 4lbs.    Time  4    Period  Weeks    Status  New      OT SHORT TERM GOAL #4   Title  Pt will improve ability to dress and prevent future complications by improving PPT#4 by at least 3sec and demo trunk rotation.    Baseline  18sec with decr trunk rotation    Time  4    Period  Weeks    Status  New      OT SHORT TERM GOAL #5   Title  Pt will report pain in R scapula consistently less than or equal to 5/10.    Baseline  up to 10/10 at times    Time  4    Period  Weeks    Status  New        OT Long Term Goals - 02/21/17 1434      OT LONG TERM GOAL #1   Title  Pt will verbalize understanding of strategies/AE to incr ease with ADLs (including writing, buttoning/dressing, picking up objects).--check LTGs 04/22/17.    Time  8    Period  Weeks    Status  New      OT LONG TERM GOAL #2   Title  Pt will improve coordination for ADLs as shown by improving time on 9-hole peg test by at least 4sec with R hand.    Baseline  32.56sec    Time  8    Period  Weeks    Status  New      OT LONG TERM GOAL #3  Title  Pt will improve coordination/functional reaching for ADLs as shown by improving score on box and blocks with RUE by at least 4 blocks    Baseline  RUE 41 blocks    Time  8     Period  Weeks    Status  New      OT LONG TERM GOAL #4   Title  Pt will fasten/unfasten 3 buttons on tabletop in 26 sec or less.    Baseline  29.19sec    Time  8    Period  Weeks    Status  New      OT LONG TERM GOAL #5   Title  Pt will improve balance/functional reaching for ADLs/IADLs as shown by improving standing functional reach by at least 2 inches bilaterally.    Baseline  R-6", L-7"    Time  8    Period  Weeks    Status  New            Plan - 02/24/17 1329    Clinical Impression Statement  Pt demo difficulty with R hand functional use due to decr ability to perform palmar abduction/adduction affecting tip pinch.    Occupational Profile and client history currently impacting functional performance  Pt is a 77 y.o. female with diagnosis of Parkinson's disease.  PMH also includes:  essential tremor, hx of L breast CA s/p lumpectomy/radiation in 2011, HTN, GERD, hyperlipidema, ?bursitis R shoulder, hx of R elbow tumor removal, hx of mild scoliosis per pt, hx of R 3rd digit trigger finger release and cyst removal.  Pt reports incr difficulty using hands and picking up items.  Pt also reports dropping items from R hand and scapular pain with gradual onset over last 6 weeks.  Pt is currently being seen for further evaluation of R scapular pain by Ortho MD.   Pt is independent and assists in caring for husband prn.      Occupational performance deficits (Please refer to evaluation for details):  ADL's;IADL's;Social Participation;Leisure;Rest and Sleep    Rehab Potential  Good    Current Impairments/barriers affecting progress:  R scapular pain with unknown cause    OT Frequency  2x / week    OT Duration  8 weeks +eval    OT Treatment/Interventions  Self-care/ADL training;Moist Heat;Fluidtherapy;DME and/or AE instruction;Splinting;Balance training;Therapeutic activities;Ultrasound;Therapeutic exercise;Cognitive remediation/compensation;Passive range of motion;Functional Mobility  Training;Neuromuscular education;Cryotherapy;Patient/family education;Manual Therapy;Energy conservation;Paraffin;Electrical Stimulation    Plan  review HEP for coordination    Clinical Decision Making  Several treatment options, min-mod task modification necessary    OT Home Exercise Plan  Education provided:  red putty HEP for RUE; coordination HEP    Consulted and Agree with Plan of Care  Patient       Patient will benefit from skilled therapeutic intervention in order to improve the following deficits and impairments:  Abnormal gait, Decreased knowledge of use of DME, Impaired flexibility, Decreased coordination, Decreased endurance, Decreased range of motion, Decreased strength, Impaired tone, Decreased balance, Decreased knowledge of precautions, Decreased safety awareness, Impaired perceived functional ability, Impaired UE functional use  Visit Diagnosis: Other symptoms and signs involving the nervous system  Other symptoms and signs involving the musculoskeletal system  Tremor  Abnormal posture  Acute pain of right shoulder  Other abnormalities of gait and mobility  Other lack of coordination  Unsteadiness  Muscle weakness (generalized)    Problem List Patient Active Problem List   Diagnosis Date Noted  . 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 Chronic  nonallergic rhinitis 12/29/2016  . Mild persistent asthma, uncomplicated 95/18/8416  . 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 Gastroesophageal reflux disease 12/29/2016  . Cervical spondylosis with myelopathy and radiculopathy 11/14/2013  . Essential tremor 06/13/2013  . Cervical spondylosis without myelopathy 06/13/2013  . Breast cancer of lower-outer quadrant of left female breast ([place]) 09/10/2010    [facility] 02/24/2017, 3:30 PM  [facility] [address] [place], [place], [address] Phone: [phone]   Fax:  [phone]  Name: AHNI BRADWELL MRN: [id number] Date of Birth: Jul 18, 1940   Vianne Bulls, OTR/L [facility] [address] [place], [place]  [address] [phone] phone [phone] 02/24/17 3:30 PM

## 2017-02-24 NOTE — Patient Instructions (Addendum)
Coordination Exercises  Perform the following exercises for 20 minutes 1 times per day. Perform with both hand(s). Perform using big movements.   Touch each finger to your thumb and focus on making it round.  Flipping Cards: Place deck of cards on the table. Flip cards over by opening your hand big to grasp and then turn your palm up big, opening hand fully to release.  Deal cards: Hold 1/2 or whole deck in your hand. Use thumb to push card off top of deck with one big push.  Rotate ball with fingertips: Pick up with fingers/thumb and move as much as you can with each turn/movement (clockwise and counter-clockwise).  Rotate 2 golf balls in your hand: Both directions.  Pick up coins and stack one at a time: Pick up with big, intentional movements. Do not drag coin to the edge. (5-10 in a stack)  Use fingertips  Pick up 5-10 coins one at a time and hold in palm. Then, move coins from palm to fingertips one at time and place in coin bank/container.   Palmar Adduction/Abduction (Active)    Move thumb down, away from palm. Move back to rest along palm. Repeat 10 times. Do 1 sessions per day.

## 2017-02-28 ENCOUNTER — Encounter: Payer: Self-pay | Admitting: Occupational Therapy

## 2017-02-28 ENCOUNTER — Ambulatory Visit: Payer: Medicare Other | Admitting: Occupational Therapy

## 2017-02-28 DIAGNOSIS — M6281 Muscle weakness (generalized): Secondary | ICD-10-CM

## 2017-02-28 DIAGNOSIS — R251 Tremor, unspecified: Secondary | ICD-10-CM

## 2017-02-28 DIAGNOSIS — R293 Abnormal posture: Secondary | ICD-10-CM

## 2017-02-28 DIAGNOSIS — M25511 Pain in right shoulder: Secondary | ICD-10-CM | POA: Diagnosis not present

## 2017-02-28 DIAGNOSIS — R2681 Unsteadiness on feet: Secondary | ICD-10-CM

## 2017-02-28 DIAGNOSIS — R29898 Other symptoms and signs involving the musculoskeletal system: Secondary | ICD-10-CM

## 2017-02-28 DIAGNOSIS — R29818 Other symptoms and signs involving the nervous system: Secondary | ICD-10-CM

## 2017-02-28 DIAGNOSIS — R278 Other lack of coordination: Secondary | ICD-10-CM

## 2017-02-28 DIAGNOSIS — R2689 Other abnormalities of gait and mobility: Secondary | ICD-10-CM

## 2017-02-28 NOTE — Therapy (Signed)
[facility] [address] [place], [place], [address] Phone: [phone]   Fax:  [phone]  Occupational Therapy Treatment  Patient Details  Name: April Church MRN: [id number] Date of Birth: 03-09-1940 Referring Provider (Historical): Dr. Leta Baptist   Encounter Date: 02/28/2017  OT End of Session - 02/28/17 1025    Visit Number  4    Number of Visits  17    Date for OT Re-Evaluation  04/22/17    Authorization Type  Medicare/BCBS, need g-code    Authorization Time Period  cert. dates 02/21/17-04/22/17    Authorization - Visit Number  4    Authorization - Number of Visits  10    OT Start Time  1020    OT Stop Time  1100    OT Time Calculation (min)  40 min    Activity Tolerance  Patient tolerated treatment well    Behavior During Therapy  WFL for tasks assessed/performed       Past Medical History:  Diagnosis Date  . Asthma    daily inhaler  . Breast cancer ([place])    left- radiation and surgery -dx. 2011- no further tx. now- [name] , [name]  . Cyst of finger 11/2011   annular cyst right long finger  . Dental crowns present   . Dermatitis   . Frequency of urination   . GERD (gastroesophageal reflux disease)   . Hemorrhoid   . Hyperlipidemia   . Hypertension    under control, has been on med. x 2 yrs.  [name] PONV (postoperative nausea and vomiting)   . Seasonal allergies   . Tremors of nervous system    hands  . Trigger finger of right hand 11/2011   long finger    Past Surgical History:  Procedure Laterality Date  . ANTERIOR CERVICAL DECOMPRESSION/DISCECTOMY FUSION 4 LEVELS N/A 11/14/2013   Procedure: ANTERIOR CERVICAL DECOMPRESSION/DISCECTOMY FUSION 4 LEVELS;  Surgeon: Newman Pies, MD;  Location: [facility];  Service: Neurosurgery;  Laterality: N/A;  C34 C45 C56 C67 anterior cervical fusion with interbody prosthesis plating and  bonegraft  . APPENDECTOMY  age 86  . BREAST LUMPECTOMY   06/16/2009   left; SLN bx.  [name] BREAST LUMPECTOMY  07/01/2009   re-excision  . BREAST SURGERY  1999   reduction  . CATARACT EXTRACTION, BILATERAL    . COLONOSCOPY WITH PROPOFOL N/A 12/03/2014   Procedure: COLONOSCOPY WITH PROPOFOL;  Surgeon: [name], MD;  Location: [facility];  Service: Endoscopy;  Laterality: N/A;  . FOOT SURGERY  06/22/11   left  . KNEE ARTHROSCOPY  03/12/2005   right  . KNEE ARTHROSCOPY     left  . NASAL SINUS SURGERY     x 2  . 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 TRIGGER FINGER RELEASE  12/16/2011   Procedure: RELEASE TRIGGER FINGER/A-1 PULLEY;  Surgeon: Tennis Must, MD;  Location: [facility];  Service: Orthopedics;  Laterality: Right;  RIGHT LONG FINGER TRIGGER RELEASE & ANNULAR CYST EXCISION  . TUMOR EXCISION  age 58   right arm    There were no vitals filed for this visit.  Subjective Assessment - 02/28/17 1021    Subjective   pain has been "not good"  Pt reports that as the day goes on the pain gets worse.  Pt reports holding pillow as recommended helps.    Pertinent History  Parkinson's disease, essential tremor, hx of  L breast CA s/p lumpectomy/radiation in 2011, HTN, GERD, hyperlipidema, ?bursitis R shoulder, hx of R elbow tumor removal, hx of mild scoliosis per pt, hx of R 3rd digit trigger finger release and cyst removal    Patient Stated Goals  improve coordination for ADLs    Currently in Pain?  No/denies    Pain Onset  More than a month ago         Removing pegs from red putty for incr pinch strength with min difficulty.  Placing grooved pegs in pegboard with R hand with mod difficulty with coordination with min cues for PWR! Hands.                 OT Education - 02/28/17 1038    Education Details  Reviewed coordination HEP (emphasis on R hand)    Person(s) Educated  Patient    Methods  Explanation;Demonstration    Comprehension  Verbalized understanding;Returned  demonstration min v.c.   min v.c.      OT Short Term Goals - 02/21/17 1434      OT SHORT TERM GOAL #1   Title  Pt will be independent with updated HEP--check STGs  03/23/17.    Time  4    Period  Weeks    Status  New      OT SHORT TERM GOAL #2   Title  Pt will improve R grip strength by at least 5lbs for improved ability to hold objects/open containers.    Baseline  R-28lbs    Time  4    Period  Weeks    Status  New      OT SHORT TERM GOAL #3   Title  Pt will improve R lateral and 3point pinch strengths by at least 3lbs each for ADLs.    Baseline  R lateral-7lbs, R 3point 4lbs.    Time  4    Period  Weeks    Status  New      OT SHORT TERM GOAL #4   Title  Pt will improve ability to dress and prevent future complications by improving PPT#4 by at least 3sec and demo trunk rotation.    Baseline  18sec with decr trunk rotation    Time  4    Period  Weeks    Status  New      OT SHORT TERM GOAL #5   Title  Pt will report pain in R scapula consistently less than or equal to 5/10.    Baseline  up to 10/10 at times    Time  4    Period  Weeks    Status  New        OT Long Term Goals - 02/21/17 1434      OT LONG TERM GOAL #1   Title  Pt will verbalize understanding of strategies/AE to incr ease with ADLs (including writing, buttoning/dressing, picking up objects).--check LTGs 04/22/17.    Time  8    Period  Weeks    Status  New      OT LONG TERM GOAL #2   Title  Pt will improve coordination for ADLs as shown by improving time on 9-hole peg test by at least 4sec with R hand.    Baseline  32.56sec    Time  8    Period  Weeks    Status  New      OT LONG TERM GOAL #3   Title  Pt will improve coordination/functional reaching for ADLs  as shown by improving score on box and blocks with RUE by at least 4 blocks    Baseline  RUE 41 blocks    Time  8    Period  Weeks    Status  New      OT LONG TERM GOAL #4   Title  Pt will fasten/unfasten 3 buttons on tabletop in 26 sec  or less.    Baseline  29.19sec    Time  8    Period  Weeks    Status  New      OT LONG TERM GOAL #5   Title  Pt will improve balance/functional reaching for ADLs/IADLs as shown by improving standing functional reach by at least 2 inches bilaterally.    Baseline  R-6", L-7"    Time  8    Period  Weeks    Status  New            Plan - 02/28/17 1025    Clinical Impression Statement  Pt demo difficulty with R hand functional use due to decr ability to perform palmar abduction/adduction affecting tip pinch.    Occupational Profile and client history currently impacting functional performance  Pt is a 77 y.o. female with diagnosis of Parkinson's disease.  PMH also includes:  essential tremor, hx of L breast CA s/p lumpectomy/radiation in 2011, HTN, GERD, hyperlipidema, ?bursitis R shoulder, hx of R elbow tumor removal, hx of mild scoliosis per pt, hx of R 3rd digit trigger finger release and cyst removal.  Pt reports incr difficulty using hands and picking up items.  Pt also reports dropping items from R hand and scapular pain with gradual onset over last 6 weeks.  Pt is currently being seen for further evaluation of R scapular pain by Ortho MD.   Pt is independent and assists in caring for husband prn.      Occupational performance deficits (Please refer to evaluation for details):  ADL's;IADL's;Social Participation;Leisure;Rest and Sleep    Rehab Potential  Good    Current Impairments/barriers affecting progress:  R scapular pain with unknown cause    OT Frequency  2x / week    OT Duration  8 weeks +eval    OT Treatment/Interventions  Self-care/ADL training;Moist Heat;Fluidtherapy;DME and/or AE instruction;Splinting;Balance training;Therapeutic activities;Ultrasound;Therapeutic exercise;Cognitive remediation/compensation;Passive range of motion;Functional Mobility Training;Neuromuscular education;Cryotherapy;Patient/family education;Manual Therapy;Energy conservation;Paraffin;Electrical  Stimulation    Plan  ADLs with large amplitude movements/strategies    Clinical Decision Making  Several treatment options, min-mod task modification necessary    OT Home Exercise Plan  Education provided:  red putty HEP for RUE; coordination HEP    Consulted and Agree with Plan of Care  Patient       Patient will benefit from skilled therapeutic intervention in order to improve the following deficits and impairments:  Abnormal gait, Decreased knowledge of use of DME, Impaired flexibility, Decreased coordination, Decreased endurance, Decreased range of motion, Decreased strength, Impaired tone, Decreased balance, Decreased knowledge of precautions, Decreased safety awareness, Impaired perceived functional ability, Impaired UE functional use  Visit Diagnosis: Other symptoms and signs involving the nervous system  Other symptoms and signs involving the musculoskeletal system  Tremor  Abnormal posture  Acute pain of right shoulder  Other abnormalities of gait and mobility  Other lack of coordination  Unsteadiness  Muscle weakness (generalized)    Problem List Patient Active Problem List   Diagnosis Date Noted  . 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 Chronic nonallergic rhinitis 12/29/2016  . Mild persistent asthma,  uncomplicated 21/12/7354  . 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 Gastroesophageal reflux disease 12/29/2016  . Cervical spondylosis with myelopathy and radiculopathy 11/14/2013  . Essential tremor 06/13/2013  . Cervical spondylosis without myelopathy 06/13/2013  . Breast cancer of lower-outer quadrant of left female breast [facility]) 09/10/2010    [facility] 02/28/2017, 10:51 AM  [facility] [address] [place], [place], [address] Phone: [phone]   Fax:  [phone]  Name: SHARILYNN CASSITY MRN: [id number] Date of Birth: May 29, 1940   Vianne Bulls, OTR/L [facility] [address] [place], [place]  [address] [phone] phone [phone] 02/28/17 10:51 AM

## 2017-03-02 DIAGNOSIS — I10 Essential (primary) hypertension: Secondary | ICD-10-CM | POA: Diagnosis not present

## 2017-03-02 DIAGNOSIS — M5020 Other cervical disc displacement, unspecified cervical region: Secondary | ICD-10-CM | POA: Diagnosis not present

## 2017-03-02 DIAGNOSIS — M542 Cervicalgia: Secondary | ICD-10-CM | POA: Diagnosis not present

## 2017-03-02 DIAGNOSIS — M25511 Pain in right shoulder: Secondary | ICD-10-CM | POA: Diagnosis not present

## 2017-03-03 ENCOUNTER — Ambulatory Visit: Payer: Medicare Other | Admitting: Occupational Therapy

## 2017-03-03 ENCOUNTER — Encounter: Payer: Self-pay | Admitting: Occupational Therapy

## 2017-03-03 DIAGNOSIS — R29898 Other symptoms and signs involving the musculoskeletal system: Secondary | ICD-10-CM

## 2017-03-03 DIAGNOSIS — R29818 Other symptoms and signs involving the nervous system: Secondary | ICD-10-CM

## 2017-03-03 DIAGNOSIS — R293 Abnormal posture: Secondary | ICD-10-CM | POA: Diagnosis not present

## 2017-03-03 DIAGNOSIS — R2689 Other abnormalities of gait and mobility: Secondary | ICD-10-CM

## 2017-03-03 DIAGNOSIS — R251 Tremor, unspecified: Secondary | ICD-10-CM

## 2017-03-03 DIAGNOSIS — R2681 Unsteadiness on feet: Secondary | ICD-10-CM

## 2017-03-03 DIAGNOSIS — M25511 Pain in right shoulder: Secondary | ICD-10-CM | POA: Diagnosis not present

## 2017-03-03 DIAGNOSIS — M6281 Muscle weakness (generalized): Secondary | ICD-10-CM

## 2017-03-03 DIAGNOSIS — R278 Other lack of coordination: Secondary | ICD-10-CM

## 2017-03-03 NOTE — Therapy (Signed)
[facility] [address] [place], [place], [address] Phone: [phone]   Fax:  [phone]  Occupational Therapy Treatment  Patient Details  Name: April Church MRN: [id number] Date of Birth: 1940-09-16 No Data Recorded  Encounter Date: 03/03/2017  OT End of Session - 03/03/17 1030    Visit Number  5    Number of Visits  17    Date for OT Re-Evaluation  04/22/17    Authorization Type  Medicare/BCBS, need g-code    Authorization Time Period  cert. dates 02/21/17-04/22/17    Authorization - Visit Number  0    Authorization - Number of Visits  0    OT Start Time  1022    OT Stop Time  1110    OT Time Calculation (min)  48 min    Activity Tolerance  Patient tolerated treatment well    Behavior During Therapy  WFL for tasks assessed/performed       Past Medical History:  Diagnosis Date  . Asthma    daily inhaler  . Breast cancer ([place])    left- radiation and surgery -dx. 2011- no further tx. now- [name] , [name]  . Cyst of finger 11/2011   annular cyst right long finger  . Dental crowns present   . Dermatitis   . Frequency of urination   . GERD (gastroesophageal reflux disease)   . Hemorrhoid   . Hyperlipidemia   . Hypertension    under control, has been on med. x 2 yrs.  [name] PONV (postoperative nausea and vomiting)   . Seasonal allergies   . Tremors of nervous system    hands  . Trigger finger of right hand 11/2011   long finger    Past Surgical History:  Procedure Laterality Date  . ANTERIOR CERVICAL DECOMPRESSION/DISCECTOMY FUSION 4 LEVELS N/A 11/14/2013   Procedure: ANTERIOR CERVICAL DECOMPRESSION/DISCECTOMY FUSION 4 LEVELS;  Surgeon: Newman Pies, MD;  Location: [facility];  Service: Neurosurgery;  Laterality: N/A;  C34 C45 C56 C67 anterior cervical fusion with interbody prosthesis plating and  bonegraft  . APPENDECTOMY  age 53  . BREAST LUMPECTOMY  06/16/2009   left; SLN bx.  [name]  BREAST LUMPECTOMY  07/01/2009   re-excision  . BREAST SURGERY  1999   reduction  . CATARACT EXTRACTION, BILATERAL    . COLONOSCOPY WITH PROPOFOL N/A 12/03/2014   Procedure: COLONOSCOPY WITH PROPOFOL;  Surgeon: [name], MD;  Location: [facility];  Service: Endoscopy;  Laterality: N/A;  . FOOT SURGERY  06/22/11   left  . KNEE ARTHROSCOPY  03/12/2005   right  . KNEE ARTHROSCOPY     left  . NASAL SINUS SURGERY     x 2  . 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 TRIGGER FINGER RELEASE  12/16/2011   Procedure: RELEASE TRIGGER FINGER/A-1 PULLEY;  Surgeon: Tennis Must, MD;  Location: [facility];  Service: Orthopedics;  Laterality: Right;  RIGHT LONG FINGER TRIGGER RELEASE & ANNULAR CYST EXCISION  . TUMOR EXCISION  age 1   right arm    There were no vitals filed for this visit.  Subjective Assessment - 03/03/17 1023    Subjective   Pt reports that the pinched nerve is not in the spine, but is beside it.--did not bring in office notes.   Pt reports that MD recommended meds (see updated list) and will send to therapy.  Pertinent History  Parkinson's disease, essential tremor, hx of L breast CA s/p lumpectomy/radiation in 2011, HTN, GERD, hyperlipidema, ?bursitis R shoulder, hx of R elbow tumor removal, hx of mild scoliosis per pt, hx of R 3rd digit trigger finger release and cyst removal    Patient Stated Goals  improve coordination for ADLs    Currently in Pain?  No/denies    Pain Onset  More than a month ago          Practiced buttoning/unbuttoning shirt on table top with min cues for use of PWR! Hands prior to buttoning and use of deliberate/large amplitude movements after instruction.  Pt demo improvement with repetition and use of large amplitude movements.   Practiced grasp/release of cylinder objects (bottles, cups) with use of PWR! Hands/large amplitude movements with min cueing.  Opening/closing bottles with use  of large amplitude movement strategies with min cueing.                    OT Education - 03/03/17 1259    Education Details  Large amplitude movement strategies for ADLs/IADLs; Parkinson's etiology (per pt questions); difference between PD tremor and ET; discussed how hyperthenar eminance wasting affecting coordination    Person(s) Educated  Patient    Methods  Explanation;Handout    Comprehension  Verbalized understanding       OT Short Term Goals - 02/21/17 1434      OT SHORT TERM GOAL #1   Title  Pt will be independent with updated HEP--check STGs  03/23/17.    Time  4    Period  Weeks    Status  New      OT SHORT TERM GOAL #2   Title  Pt will improve R grip strength by at least 5lbs for improved ability to hold objects/open containers.    Baseline  R-28lbs    Time  4    Period  Weeks    Status  New      OT SHORT TERM GOAL #3   Title  Pt will improve R lateral and 3point pinch strengths by at least 3lbs each for ADLs.    Baseline  R lateral-7lbs, R 3point 4lbs.    Time  4    Period  Weeks    Status  New      OT SHORT TERM GOAL #4   Title  Pt will improve ability to dress and prevent future complications by improving PPT#4 by at least 3sec and demo trunk rotation.    Baseline  18sec with decr trunk rotation    Time  4    Period  Weeks    Status  New      OT SHORT TERM GOAL #5   Title  Pt will report pain in R scapula consistently less than or equal to 5/10.    Baseline  up to 10/10 at times    Time  4    Period  Weeks    Status  New        OT Long Term Goals - 02/21/17 1434      OT LONG TERM GOAL #1   Title  Pt will verbalize understanding of strategies/AE to incr ease with ADLs (including writing, buttoning/dressing, picking up objects).--check LTGs 04/22/17.    Time  8    Period  Weeks    Status  New      OT LONG TERM GOAL #2   Title  Pt will improve coordination for ADLs  as shown by improving time on 9-hole peg test by at least 4sec with  R hand.    Baseline  32.56sec    Time  8    Period  Weeks    Status  New      OT LONG TERM GOAL #3   Title  Pt will improve coordination/functional reaching for ADLs as shown by improving score on box and blocks with RUE by at least 4 blocks    Baseline  RUE 41 blocks    Time  8    Period  Weeks    Status  New      OT LONG TERM GOAL #4   Title  Pt will fasten/unfasten 3 buttons on tabletop in 26 sec or less.    Baseline  29.19sec    Time  8    Period  Weeks    Status  New      OT LONG TERM GOAL #5   Title  Pt will improve balance/functional reaching for ADLs/IADLs as shown by improving standing functional reach by at least 2 inches bilaterally.    Baseline  R-6", L-7"    Time  8    Period  Weeks    Status  New            Plan - 03/03/17 1031    Clinical Impression Statement  Pt verbalized understanding of large amplitude movement strategies for ADLs/IADLs.  Pt primary limiting factor with RUE functional use appears to be related to R hyperthenar eminance wasting.    Occupational Profile and client history currently impacting functional performance  Pt is a 77 y.o. female with diagnosis of Parkinson's disease.  PMH also includes:  essential tremor, hx of L breast CA s/p lumpectomy/radiation in 2011, HTN, GERD, hyperlipidema, ?bursitis R shoulder, hx of R elbow tumor removal, hx of mild scoliosis per pt, hx of R 3rd digit trigger finger release and cyst removal.  Pt reports incr difficulty using hands and picking up items.  Pt also reports dropping items from R hand and scapular pain with gradual onset over last 6 weeks.  Pt is currently being seen for further evaluation of R scapular pain by Ortho MD.   Pt is independent and assists in caring for husband prn.      Occupational performance deficits (Please refer to evaluation for details):  ADL's;IADL's;Social Participation;Leisure;Rest and Sleep    Rehab Potential  Good    Current Impairments/barriers affecting progress:  R  scapular pain with unknown cause    OT Frequency  2x / week    OT Duration  8 weeks +eval    OT Treatment/Interventions  Self-care/ADL training;Moist Heat;Fluidtherapy;DME and/or AE instruction;Splinting;Balance training;Therapeutic activities;Ultrasound;Therapeutic exercise;Cognitive remediation/compensation;Passive range of motion;Functional Mobility Training;Neuromuscular education;Cryotherapy;Patient/family education;Manual Therapy;Energy conservation;Paraffin;Electrical Stimulation    Plan  coordination; supine PWR!/modified quadraped PWR!, functional reaching/pinch    Clinical Decision Making  Several treatment options, min-mod task modification necessary    OT Home Exercise Plan  Education provided:  red putty HEP for RUE; coordination HEP    Consulted and Agree with Plan of Care  Patient       Patient will benefit from skilled therapeutic intervention in order to improve the following deficits and impairments:  Abnormal gait, Decreased knowledge of use of DME, Impaired flexibility, Decreased coordination, Decreased endurance, Decreased range of motion, Decreased strength, Impaired tone, Decreased balance, Decreased knowledge of precautions, Decreased safety awareness, Impaired perceived functional ability, Impaired UE functional use  Visit Diagnosis: Other symptoms and signs  involving the nervous system  Other symptoms and signs involving the musculoskeletal system  Tremor  Abnormal posture  Acute pain of right shoulder  Other abnormalities of gait and mobility  Other lack of coordination  Unsteadiness  Muscle weakness (generalized)    Problem List Patient Active Problem List   Diagnosis Date Noted  . 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 Chronic nonallergic rhinitis 12/29/2016  . Mild persistent asthma, uncomplicated 17/61/6073  . 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 Gastroesophageal reflux disease 12/29/2016  . Cervical spondylosis with myelopathy and  radiculopathy 11/14/2013  . Essential tremor 06/13/2013  . Cervical spondylosis without myelopathy 06/13/2013  . Breast cancer of lower-outer quadrant of left female breast ([place]) 09/10/2010    [facility] 03/03/2017, 1:27 PM  [facility] [address] [place], [place], [address] Phone: [phone]   Fax:  [phone]  Name: XENIA NILE MRN: [id number] Date of Birth: [birth year]   Vianne Bulls, OTR/L [facility] [address] [place], [place]  [address] [phone] phone [phone] 03/03/17 1:27 PM

## 2017-03-07 ENCOUNTER — Encounter: Payer: Self-pay | Admitting: Occupational Therapy

## 2017-03-07 ENCOUNTER — Ambulatory Visit: Payer: Medicare Other | Admitting: Occupational Therapy

## 2017-03-07 DIAGNOSIS — R29898 Other symptoms and signs involving the musculoskeletal system: Secondary | ICD-10-CM | POA: Diagnosis not present

## 2017-03-07 DIAGNOSIS — R2681 Unsteadiness on feet: Secondary | ICD-10-CM

## 2017-03-07 DIAGNOSIS — R2689 Other abnormalities of gait and mobility: Secondary | ICD-10-CM

## 2017-03-07 DIAGNOSIS — M25511 Pain in right shoulder: Secondary | ICD-10-CM | POA: Diagnosis not present

## 2017-03-07 DIAGNOSIS — R29818 Other symptoms and signs involving the nervous system: Secondary | ICD-10-CM | POA: Diagnosis not present

## 2017-03-07 DIAGNOSIS — R251 Tremor, unspecified: Secondary | ICD-10-CM | POA: Diagnosis not present

## 2017-03-07 DIAGNOSIS — R278 Other lack of coordination: Secondary | ICD-10-CM

## 2017-03-07 DIAGNOSIS — R293 Abnormal posture: Secondary | ICD-10-CM

## 2017-03-07 NOTE — Therapy (Signed)
[facility] [address] [place], [place], [address] Phone: [phone]   Fax:  [phone]  Occupational Therapy Treatment  Patient Details  Name: April Church MRN: [id number] Date of Birth: 11-12-1940 No Data Recorded  Encounter Date: 03/07/2017  OT End of Session - 03/07/17 1021    Visit Number  6    Number of Visits  17    Date for OT Re-Evaluation  04/22/17    Authorization Type  Medicare/BCBS, need g-code    Authorization Time Period  cert. dates 02/21/17-04/22/17    Authorization - Visit Number  0    Authorization - Number of Visits  0    OT Start Time  1019    OT Stop Time  1100    OT Time Calculation (min)  41 min    Activity Tolerance  Patient tolerated treatment well    Behavior During Therapy  WFL for tasks assessed/performed       Past Medical History:  Diagnosis Date  . Asthma    daily inhaler  . Breast cancer ([place])    left- radiation and surgery -dx. 2011- no further tx. now- [name] , [name]  . Cyst of finger 11/2011   annular cyst right long finger  . Dental crowns present   . Dermatitis   . Frequency of urination   . GERD (gastroesophageal reflux disease)   . Hemorrhoid   . Hyperlipidemia   . Hypertension    under control, has been on med. x 2 yrs.  [name] PONV (postoperative nausea and vomiting)   . Seasonal allergies   . Tremors of nervous system    hands  . Trigger finger of right hand 11/2011   long finger    Past Surgical History:  Procedure Laterality Date  . ANTERIOR CERVICAL DECOMPRESSION/DISCECTOMY FUSION 4 LEVELS N/A 11/14/2013   Procedure: ANTERIOR CERVICAL DECOMPRESSION/DISCECTOMY FUSION 4 LEVELS;  Surgeon: Newman Pies, MD;  Location: [facility];  Service: Neurosurgery;  Laterality: N/A;  C34 C45 C56 C67 anterior cervical fusion with interbody prosthesis plating and  bonegraft  . APPENDECTOMY  age 26  . BREAST LUMPECTOMY  06/16/2009   left; SLN bx.  [name]  BREAST LUMPECTOMY  07/01/2009   re-excision  . BREAST SURGERY  1999   reduction  . CATARACT EXTRACTION, BILATERAL    . COLONOSCOPY WITH PROPOFOL N/A 12/03/2014   Procedure: COLONOSCOPY WITH PROPOFOL;  Surgeon: [name], MD;  Location: [facility];  Service: Endoscopy;  Laterality: N/A;  . FOOT SURGERY  06/22/11   left  . KNEE ARTHROSCOPY  03/12/2005   right  . KNEE ARTHROSCOPY     left  . NASAL SINUS SURGERY     x 2  . 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 TRIGGER FINGER RELEASE  12/16/2011   Procedure: RELEASE TRIGGER FINGER/A-1 PULLEY;  Surgeon: Tennis Must, MD;  Location: [facility];  Service: Orthopedics;  Laterality: Right;  RIGHT LONG FINGER TRIGGER RELEASE & ANNULAR CYST EXCISION  . TUMOR EXCISION  age 17   right arm    There were no vitals filed for this visit.  Subjective Assessment - 03/07/17 1019    Subjective   Pt reports shoulder hurt a lot last night    Pertinent History  Parkinson's disease, essential tremor, hx of L breast CA s/p lumpectomy/radiation in 2011, HTN, GERD, hyperlipidema, ?bursitis R shoulder, hx of R elbow tumor removal, hx of  mild scoliosis per pt, hx of R 3rd digit trigger finger release and cyst removal    Patient Stated Goals  improve coordination for ADLs    Currently in Pain?  Yes    Pain Score  1     Pain Location  -- scapula    Pain Orientation  Right    Pain Descriptors / Indicators  Aching    Pain Type  Chronic pain    Pain Onset  More than a month ago    Pain Frequency  Intermittent    Aggravating Factors   laying on it    Pain Relieving Factors  tylenol, heating pad          PWR! Moves (up, rock, twist) in modified quadraped x 10-20 each with min cues for incr movement amplitude--no pain reported.   Functional reaching to place/remove clothespins with 1-6lbs resistance on vertical pole with RUE for incr strength, coordination, and functional reach.  Emphasis on tip pinch  for yellow, 3point pinch for red and green, lateral pinch for blue, unable to complete black.  Min cueing for shoulder hike.  Placing small pegs in pegboard with R hand with emphasis/cueing on tip pinch as able, cueing for PWR! Hands, and difficulty due to tremors as well.  Min-mod difficulty with coordination.  Also removing with tip pinch and min cues for copying design correctly.  Reviewed importance of tying to pick up small objects with tip pinch at home.                             OT Short Term Goals - 03/07/17 1221      OT SHORT TERM GOAL #1   Title  Pt will be independent with updated HEP--check STGs  03/23/17.    Time  4    Period  Weeks    Status  Achieved      OT SHORT TERM GOAL #2   Title  Pt will improve R grip strength by at least 5lbs for improved ability to hold objects/open containers.    Baseline  R-28lbs    Time  4    Period  Weeks    Status  New      OT SHORT TERM GOAL #3   Title  Pt will improve R lateral and 3point pinch strengths by at least 3lbs each for ADLs.    Baseline  R lateral-7lbs, R 3point 4lbs.    Time  4    Period  Weeks    Status  New      OT SHORT TERM GOAL #4   Title  Pt will improve ability to dress and prevent future complications by improving PPT#4 by at least 3sec and demo trunk rotation.    Baseline  18sec with decr trunk rotation    Time  4    Period  Weeks    Status  New      OT SHORT TERM GOAL #5   Title  Pt will report pain in R scapula consistently less than or equal to 5/10.    Baseline  up to 10/10 at times    Time  4    Period  Weeks    Status  New        OT Long Term Goals - 02/21/17 1434      OT LONG TERM GOAL #1   Title  Pt will verbalize understanding of strategies/AE to incr ease with ADLs (including  writing, buttoning/dressing, picking up objects).--check LTGs 04/22/17.    Time  8    Period  Weeks    Status  New      OT LONG TERM GOAL #2   Title  Pt will improve coordination for ADLs  as shown by improving time on 9-hole peg test by at least 4sec with R hand.    Baseline  32.56sec    Time  8    Period  Weeks    Status  New      OT LONG TERM GOAL #3   Title  Pt will improve coordination/functional reaching for ADLs as shown by improving score on box and blocks with RUE by at least 4 blocks    Baseline  RUE 41 blocks    Time  8    Period  Weeks    Status  New      OT LONG TERM GOAL #4   Title  Pt will fasten/unfasten 3 buttons on tabletop in 26 sec or less.    Baseline  29.19sec    Time  8    Period  Weeks    Status  New      OT LONG TERM GOAL #5   Title  Pt will improve balance/functional reaching for ADLs/IADLs as shown by improving standing functional reach by at least 2 inches bilaterally.    Baseline  R-6", L-7"    Time  8    Period  Weeks    Status  New            Plan - 03/07/17 1021    Clinical Impression Statement  Pt is progressing towards goals and demo improved tip pinch when cued to use, but continues to be difficult.    Occupational Profile and client history currently impacting functional performance  Pt is a 77 y.o. female with diagnosis of Parkinson's disease.  PMH also includes:  essential tremor, hx of L breast CA s/p lumpectomy/radiation in 2011, HTN, GERD, hyperlipidema, ?bursitis R shoulder, hx of R elbow tumor removal, hx of mild scoliosis per pt, hx of R 3rd digit trigger finger release and cyst removal.  Pt reports incr difficulty using hands and picking up items.  Pt also reports dropping items from R hand and scapular pain with gradual onset over last 6 weeks.  Pt is currently being seen for further evaluation of R scapular pain by Ortho MD.   Pt is independent and assists in caring for husband prn.      Occupational performance deficits (Please refer to evaluation for details):  ADL's;IADL's;Social Participation;Leisure;Rest and Sleep    Rehab Potential  Good    Current Impairments/barriers affecting progress:  R scapular pain with  unknown cause    OT Frequency  2x / week    OT Duration  8 weeks +eval    OT Treatment/Interventions  Self-care/ADL training;Moist Heat;Fluidtherapy;DME and/or AE instruction;Splinting;Balance training;Therapeutic activities;Ultrasound;Therapeutic exercise;Cognitive remediation/compensation;Passive range of motion;Functional Mobility Training;Neuromuscular education;Cryotherapy;Patient/family education;Manual Therapy;Energy conservation;Paraffin;Electrical Stimulation    Plan  begin checking STGs; standing functional reaching, coordination; donning/doffing jacket    Clinical Decision Making  Several treatment options, min-mod task modification necessary    OT Home Exercise Plan  Education provided:  red putty HEP for RUE; coordination HEP    Consulted and Agree with Plan of Care  Patient       Patient will benefit from skilled therapeutic intervention in order to improve the following deficits and impairments:  Abnormal gait, Decreased knowledge of use of DME,  Impaired flexibility, Decreased coordination, Decreased endurance, Decreased range of motion, Decreased strength, Impaired tone, Decreased balance, Decreased knowledge of precautions, Decreased safety awareness, Impaired perceived functional ability, Impaired UE functional use  Visit Diagnosis: Other symptoms and signs involving the nervous system  Other symptoms and signs involving the musculoskeletal system  Abnormal posture  Acute pain of right shoulder  Other lack of coordination  Unsteadiness  Other abnormalities of gait and mobility    Problem List Patient Active Problem List   Diagnosis Date Noted  . 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 Chronic nonallergic rhinitis 12/29/2016  . Mild persistent asthma, uncomplicated 41/58/3094  . 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 Gastroesophageal reflux disease 12/29/2016  . Cervical spondylosis with myelopathy and radiculopathy 11/14/2013  . Essential tremor 06/13/2013   . Cervical spondylosis without myelopathy 06/13/2013  . Breast cancer of lower-outer quadrant of left female breast [facility]) 09/10/2010    [facility] 03/07/2017, 12:21 PM  [facility] [address] [place], [place], [address] Phone: [phone]   Fax:  [phone]  Name: April Church MRN: [id number] Date of Birth: 06-11-1940   Vianne Bulls, OTR/L [facility] [address] [place], [place]  [address] [phone] phone [phone] 03/07/17 12:21 PM

## 2017-03-10 ENCOUNTER — Ambulatory Visit: Payer: Medicare Other | Admitting: Occupational Therapy

## 2017-03-10 ENCOUNTER — Encounter: Payer: Self-pay | Admitting: Occupational Therapy

## 2017-03-10 DIAGNOSIS — R2681 Unsteadiness on feet: Secondary | ICD-10-CM

## 2017-03-10 DIAGNOSIS — M6281 Muscle weakness (generalized): Secondary | ICD-10-CM

## 2017-03-10 DIAGNOSIS — R251 Tremor, unspecified: Secondary | ICD-10-CM

## 2017-03-10 DIAGNOSIS — R293 Abnormal posture: Secondary | ICD-10-CM | POA: Diagnosis not present

## 2017-03-10 DIAGNOSIS — R29818 Other symptoms and signs involving the nervous system: Secondary | ICD-10-CM

## 2017-03-10 DIAGNOSIS — R278 Other lack of coordination: Secondary | ICD-10-CM

## 2017-03-10 DIAGNOSIS — M25511 Pain in right shoulder: Secondary | ICD-10-CM | POA: Diagnosis not present

## 2017-03-10 DIAGNOSIS — R29898 Other symptoms and signs involving the musculoskeletal system: Secondary | ICD-10-CM | POA: Diagnosis not present

## 2017-03-10 DIAGNOSIS — R2689 Other abnormalities of gait and mobility: Secondary | ICD-10-CM | POA: Diagnosis not present

## 2017-03-10 NOTE — Therapy (Signed)
[facility] [address] [place], [place], [address] Phone: [phone]   Fax:  [phone]  Occupational Therapy Treatment  Patient Details  Name: April Church MRN: [id number] Date of Birth: [birth year] No Data Recorded  Encounter Date: 03/10/2017  OT End of Session - 03/10/17 1116    Visit Number  7    Number of Visits  17    Date for OT Re-Evaluation  04/22/17    Authorization Type  Medicare/BCBS, need g-code    Authorization Time Period  cert. dates 02/21/17-04/22/17    Authorization - Visit Number  0    Authorization - Number of Visits  0    OT Start Time  1106    OT Stop Time  1146    OT Time Calculation (min)  40 min    Activity Tolerance  Patient tolerated treatment well    Behavior During Therapy  WFL for tasks assessed/performed       Past Medical History:  Diagnosis Date  . Asthma    daily inhaler  . Breast cancer ([place])    left- radiation and surgery -dx. 2011- no further tx. now- [name] , [name]  . Cyst of finger 11/2011   annular cyst right long finger  . Dental crowns present   . Dermatitis   . Frequency of urination   . GERD (gastroesophageal reflux disease)   . Hemorrhoid   . Hyperlipidemia   . Hypertension    under control, has been on med. x 2 yrs.  [name] PONV (postoperative nausea and vomiting)   . Seasonal allergies   . Tremors of nervous system    hands  . Trigger finger of right hand 11/2011   long finger    Past Surgical History:  Procedure Laterality Date  . ANTERIOR CERVICAL DECOMPRESSION/DISCECTOMY FUSION 4 LEVELS N/A 11/14/2013   Procedure: ANTERIOR CERVICAL DECOMPRESSION/DISCECTOMY FUSION 4 LEVELS;  Surgeon: Newman Pies, MD;  Location: [facility];  Service: Neurosurgery;  Laterality: N/A;  C34 C45 C56 C67 anterior cervical fusion with interbody prosthesis plating and  bonegraft  . APPENDECTOMY  age 74  . BREAST LUMPECTOMY  06/16/2009   left; SLN bx.  [name]  BREAST LUMPECTOMY  07/01/2009   re-excision  . BREAST SURGERY  1999   reduction  . CATARACT EXTRACTION, BILATERAL    . COLONOSCOPY WITH PROPOFOL N/A 12/03/2014   Procedure: COLONOSCOPY WITH PROPOFOL;  Surgeon: [name], MD;  Location: [facility];  Service: Endoscopy;  Laterality: N/A;  . FOOT SURGERY  06/22/11   left  . KNEE ARTHROSCOPY  03/12/2005   right  . KNEE ARTHROSCOPY     left  . NASAL SINUS SURGERY     x 2  . 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 TRIGGER FINGER RELEASE  12/16/2011   Procedure: RELEASE TRIGGER FINGER/A-1 PULLEY;  Surgeon: Tennis Must, MD;  Location: [facility];  Service: Orthopedics;  Laterality: Right;  RIGHT LONG FINGER TRIGGER RELEASE & ANNULAR CYST EXCISION  . TUMOR EXCISION  age 36   right arm    There were no vitals filed for this visit.  Subjective Assessment - 03/10/17 1107    Subjective   pain is a little better    Pertinent History  Parkinson's disease, essential tremor, hx of L breast CA s/p lumpectomy/radiation in 2011, HTN, GERD, hyperlipidema, ?bursitis R shoulder, hx of R elbow tumor removal, hx of mild scoliosis per  pt, hx of R 3rd digit trigger finger release and cyst removal    Patient Stated Goals  improve coordination for ADLs    Currently in Pain?  No/denies    Pain Onset  More than a month ago          Functional reaching to place/remove clothespins with 1-8lbs resistance on vertical pole with RUE for incr strength, coordination, and functional reach.  Emphasis on tip pinch for yellow, 3point pinch for red and green, lateral pinch for blue, unable to complete black.  Min cueing for shoulder hike.  Placing small pegs in pegboard with R hand with emphasis/cueing on tip pinch as able.  Min-mod difficulty with coordination.  Also removing with tip pinch and min cues for copying design correctly.  Picking up blocks with gripper set on level 1 (black spring) for sustained grip  strength with min difficulty.  Checked STGs and discussed progress--see goals section below.  In standing, functional reaching with trunk rotation and wt. Shift to pick up large pegs using 3point pinch and place in vertical pegboard with min cueing.  Reviewed strategy for donning/doffing jacket with emphasis on trunk rotation.  Pt returned demo.                     OT Short Term Goals - 03/10/17 1117      OT SHORT TERM GOAL #1   Title  Pt will be independent with updated HEP--check STGs  03/23/17.    Time  4    Period  Weeks    Status  Achieved      OT SHORT TERM GOAL #2   Title  Pt will improve R grip strength by at least 5lbs for improved ability to hold objects/open containers.    Baseline  R-28lbs    Time  4    Period  Weeks    Status  On-going 03/10/17:  31lbs      OT SHORT TERM GOAL #3   Title  Pt will improve R lateral and 3point pinch strengths by at least 3lbs each for ADLs.    Baseline  R lateral-7lbs, R 3point 4lbs.    Time  4    Period  Weeks    Status  On-going 03/10/17:  lateral-7lbs, 3 point pinch 5lbs       OT SHORT TERM GOAL #4   Title  Pt will improve ability to dress and prevent future complications by improving PPT#4 by at least 3sec and demo trunk rotation.    Baseline  18sec with decr trunk rotation    Time  4    Period  Weeks    Status  On-going 03/10/17:  16.68sec with trunk rotation      OT SHORT TERM GOAL #5   Title  Pt will report pain in R scapula consistently less than or equal to 5/10.    Baseline  up to 10/10 at times    Time  4    Period  Weeks    Status  Deferred 03/10/17:  being medically mangaged by ortho MD        OT Long Term Goals - 03/10/17 1127      OT LONG TERM GOAL #1   Title  Pt will verbalize understanding of strategies/AE to incr ease with ADLs (including writing, buttoning/dressing, picking up objects).--check LTGs 04/22/17.    Time  8    Period  Weeks    Status  New      OT  LONG TERM GOAL #2   Title  Pt  will improve coordination for ADLs as shown by improving time on 9-hole peg test by at least 4sec with R hand.    Baseline  32.56sec    Time  8    Period  Weeks    Status  New      OT LONG TERM GOAL #3   Title  Pt will improve coordination/functional reaching for ADLs as shown by improving score on box and blocks with RUE by at least 4 blocks    Baseline  RUE 41 blocks    Time  8    Period  Weeks    Status  New      OT LONG TERM GOAL #4   Title  Pt will fasten/unfasten 3 buttons on tabletop in 26 sec or less.    Baseline  29.19sec    Time  8    Period  Weeks    Status  New      OT LONG TERM GOAL #5   Title  Pt will improve balance/functional reaching for ADLs/IADLs as shown by improving standing functional reach by at least 2 inches bilaterally.    Baseline  R-6", L-7"    Time  8    Period  Weeks    Status  New            Plan - 03/10/17 1116    Clinical Impression Statement  Pt is progressing towards goals and demo improved tip pinch when cued to use, but continues to be difficult.    Occupational Profile and client history currently impacting functional performance  Pt is a 77 y.o. female with diagnosis of Parkinson's disease.  PMH also includes:  essential tremor, hx of L breast CA s/p lumpectomy/radiation in 2011, HTN, GERD, hyperlipidema, ?bursitis R shoulder, hx of R elbow tumor removal, hx of mild scoliosis per pt, hx of R 3rd digit trigger finger release and cyst removal.  Pt reports incr difficulty using hands and picking up items.  Pt also reports dropping items from R hand and scapular pain with gradual onset over last 6 weeks.  Pt is currently being seen for further evaluation of R scapular pain by Ortho MD.   Pt is independent and assists in caring for husband prn.      Occupational performance deficits (Please refer to evaluation for details):  ADL's;IADL's;Social Participation;Leisure;Rest and Sleep    Rehab Potential  Good    Current Impairments/barriers  affecting progress:  R scapular pain with unknown cause    OT Frequency  2x / week    OT Duration  8 weeks +eval    OT Treatment/Interventions  Self-care/ADL training;Moist Heat;Fluidtherapy;DME and/or AE instruction;Splinting;Balance training;Therapeutic activities;Ultrasound;Therapeutic exercise;Cognitive remediation/compensation;Passive range of motion;Functional Mobility Training;Neuromuscular education;Cryotherapy;Patient/family education;Manual Therapy;Energy conservation;Paraffin;Electrical Stimulation    Plan  begin checking STGs; standing functional reaching, coordination; donning/doffing jacket    Clinical Decision Making  Several treatment options, min-mod task modification necessary    OT Home Exercise Plan  Education provided:  red putty HEP for RUE; coordination HEP    Consulted and Agree with Plan of Care  Patient       Patient will benefit from skilled therapeutic intervention in order to improve the following deficits and impairments:  Abnormal gait, Decreased knowledge of use of DME, Impaired flexibility, Decreased coordination, Decreased endurance, Decreased range of motion, Decreased strength, Impaired tone, Decreased balance, Decreased knowledge of precautions, Decreased safety awareness, Impaired perceived functional ability, Impaired UE functional use  Visit Diagnosis: Other symptoms and signs involving the nervous system  Other symptoms and signs involving the musculoskeletal system  Abnormal posture  Acute pain of right shoulder  Other lack of coordination  Unsteadiness  Other abnormalities of gait and mobility  Tremor  Muscle weakness (generalized)    Problem List Patient Active Problem List   Diagnosis Date Noted  . 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 Chronic nonallergic rhinitis 12/29/2016  . Mild persistent asthma, uncomplicated 78/93/8101  . 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 Gastroesophageal reflux disease 12/29/2016  . Cervical  spondylosis with myelopathy and radiculopathy 11/14/2013  . Essential tremor 06/13/2013  . Cervical spondylosis without myelopathy 06/13/2013  . Breast cancer of lower-outer quadrant of left female breast [facility]) 09/10/2010    [facility] 03/10/2017, 11:28 AM  [facility] [address] [place], [place], [address] Phone: [phone]   Fax:  [phone]  Name: April Church MRN: [id number] Date of Birth: Jan 02, 1941   Vianne Bulls, OTR/L [facility] [address] [place], [place]  [address] [phone] phone [phone] 03/10/17 11:29 AM

## 2017-03-16 ENCOUNTER — Ambulatory Visit: Payer: Medicare Other | Admitting: Occupational Therapy

## 2017-03-16 DIAGNOSIS — R29898 Other symptoms and signs involving the musculoskeletal system: Secondary | ICD-10-CM

## 2017-03-16 DIAGNOSIS — R293 Abnormal posture: Secondary | ICD-10-CM | POA: Diagnosis not present

## 2017-03-16 DIAGNOSIS — R29818 Other symptoms and signs involving the nervous system: Secondary | ICD-10-CM

## 2017-03-16 DIAGNOSIS — R251 Tremor, unspecified: Secondary | ICD-10-CM | POA: Diagnosis not present

## 2017-03-16 DIAGNOSIS — M25511 Pain in right shoulder: Secondary | ICD-10-CM | POA: Diagnosis not present

## 2017-03-16 DIAGNOSIS — R278 Other lack of coordination: Secondary | ICD-10-CM

## 2017-03-16 DIAGNOSIS — R2689 Other abnormalities of gait and mobility: Secondary | ICD-10-CM | POA: Diagnosis not present

## 2017-03-16 NOTE — Therapy (Signed)
[facility] [address] [place], [place], [address] Phone: [phone]   Fax:  [phone]  Occupational Therapy Treatment  Patient Details  Name: April Church MRN: [id number] Date of Birth: 06-02-1940 No Data Recorded  Encounter Date: 03/16/2017  OT End of Session - 03/16/17 1338    Visit Number  8    Number of Visits  17    Date for OT Re-Evaluation  04/22/17    Authorization Type  Medicare/BCBS, need g-code    Authorization Time Period  cert. dates 02/21/17-04/22/17    OT Start Time  1020    OT Stop Time  1100    OT Time Calculation (min)  40 min       Past Medical History:  Diagnosis Date  . Asthma    daily inhaler  . Breast cancer ([place])    left- radiation and surgery -dx. 2011- no further tx. now- [name] , [name]  . Cyst of finger 11/2011   annular cyst right long finger  . Dental crowns present   . Dermatitis   . Frequency of urination   . GERD (gastroesophageal reflux disease)   . Hemorrhoid   . Hyperlipidemia   . Hypertension    under control, has been on med. x 2 yrs.  [name] PONV (postoperative nausea and vomiting)   . Seasonal allergies   . Tremors of nervous system    hands  . Trigger finger of right hand 11/2011   long finger    Past Surgical History:  Procedure Laterality Date  . ANTERIOR CERVICAL DECOMPRESSION/DISCECTOMY FUSION 4 LEVELS N/A 11/14/2013   Procedure: ANTERIOR CERVICAL DECOMPRESSION/DISCECTOMY FUSION 4 LEVELS;  Surgeon: Newman Pies, MD;  Location: [facility];  Service: Neurosurgery;  Laterality: N/A;  C34 C45 C56 C67 anterior cervical fusion with interbody prosthesis plating and  bonegraft  . APPENDECTOMY  age 67  . BREAST LUMPECTOMY  06/16/2009   left; SLN bx.  [name] BREAST LUMPECTOMY  07/01/2009   re-excision  . BREAST SURGERY  1999   reduction  . CATARACT EXTRACTION, BILATERAL    . COLONOSCOPY WITH PROPOFOL N/A 12/03/2014   Procedure: COLONOSCOPY WITH  PROPOFOL;  Surgeon: [name], MD;  Location: [facility];  Service: Endoscopy;  Laterality: N/A;  . FOOT SURGERY  06/22/11   left  . KNEE ARTHROSCOPY  03/12/2005   right  . KNEE ARTHROSCOPY     left  . NASAL SINUS SURGERY     x 2  . 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 TRIGGER FINGER RELEASE  12/16/2011   Procedure: RELEASE TRIGGER FINGER/A-1 PULLEY;  Surgeon: Tennis Must, MD;  Location: [facility];  Service: Orthopedics;  Laterality: Right;  RIGHT LONG FINGER TRIGGER RELEASE & ANNULAR CYST EXCISION  . TUMOR EXCISION  age 39   right arm    There were no vitals filed for this visit.  Subjective Assessment - 03/16/17 1340    Pertinent History  Parkinson's disease, essential tremor, hx of L breast CA s/p lumpectomy/radiation in 2011, HTN, GERD, hyperlipidema, ?bursitis R shoulder, hx of R elbow tumor removal, hx of mild scoliosis per pt, hx of R 3rd digit trigger finger release and cyst removal    Patient Stated Goals  improve coordination for ADLs    Currently in Pain?  No/denies                Treatment: dynamic step and reach,  over target to flip large playing cards in standing, mod v.c for larger amplitude movements/ steps. Functional reaching with RUE to remove graded clothespins from vertical antennae with tip and 3 pt pinch, min v.c Placing grooved pegs into pegboard with RUE mod difficulty/ v.c for using tip pinch. Therapist started checking progress towards goals.            OT Education - 03/16/17 1340    Education provided  Yes    Education Details  Reviewed coordination activities with big movements, cards and coins    Person(s) Educated  Patient    Methods  Explanation;Demonstration;Verbal cues    Comprehension  Verbalized understanding;Returned demonstration       OT Short Term Goals - 03/10/17 1117      OT SHORT TERM GOAL #1   Title  Pt will be independent with updated HEP--check STGs   03/23/17.    Time  4    Period  Weeks    Status  Achieved      OT SHORT TERM GOAL #2   Title  Pt will improve R grip strength by at least 5lbs for improved ability to hold objects/open containers.    Baseline  R-28lbs    Time  4    Period  Weeks    Status  On-going 03/10/17:  31lbs      OT SHORT TERM GOAL #3   Title  Pt will improve R lateral and 3point pinch strengths by at least 3lbs each for ADLs.    Baseline  R lateral-7lbs, R 3point 4lbs.    Time  4    Period  Weeks    Status  On-going 03/10/17:  lateral-7lbs, 3 point pinch 5lbs       OT SHORT TERM GOAL #4   Title  Pt will improve ability to dress and prevent future complications by improving PPT#4 by at least 3sec and demo trunk rotation.    Baseline  18sec with decr trunk rotation    Time  4    Period  Weeks    Status  On-going 03/10/17:  16.68sec with trunk rotation      OT SHORT TERM GOAL #5   Title  Pt will report pain in R scapula consistently less than or equal to 5/10.    Baseline  up to 10/10 at times    Time  4    Period  Weeks    Status  Deferred 03/10/17:  being medically mangaged by ortho MD        OT Long Term Goals - 03/16/17 1034      OT LONG TERM GOAL #1   Title  Pt will verbalize understanding of strategies/AE to incr ease with ADLs (including writing, buttoning/dressing, picking up objects).--check LTGs 04/22/17.    Status  On-going      OT LONG TERM GOAL #2   Title  Pt will improve coordination for ADLs as shown by improving time on 9-hole peg test by at least 4sec with R hand.    Status  On-going 50.28      OT LONG TERM GOAL #3   Title  Pt will improve coordination/functional reaching for ADLs as shown by improving score on box and blocks with RUE by at least 4 blocks    Status  On-going      OT LONG TERM GOAL #4   Title  Pt will fasten/unfasten 3 buttons on tabletop in 26 sec or less.    Status  On-going 32.24  secs      OT LONG TERM GOAL #5   Title  Pt will improve balance/functional  reaching for ADLs/IADLs as shown by improving standing functional reach by at least 2 inches bilaterally.    Status  On-going            Plan - 03/16/17 1040    Clinical Impression Statement  Pt is progressing slowly towards goals. she agrees with plans for d/c tomorrow.    Rehab Potential  Good    Current Impairments/barriers affecting progress:  R scapular pain with unknown cause    OT Frequency  2x / week    OT Duration  8 weeks    OT Treatment/Interventions  Self-care/ADL training;Moist Heat;Fluidtherapy;DME and/or AE instruction;Splinting;Balance training;Therapeutic activities;Ultrasound;Therapeutic exercise;Cognitive remediation/compensation;Passive range of motion;Functional Mobility Training;Neuromuscular education;Cryotherapy;Patient/family education;Manual Therapy;Energy conservation;Paraffin;Electrical Stimulation    Plan  check goals and anticipate d/c next visit    Consulted and Agree with Plan of Care  Patient       Patient will benefit from skilled therapeutic intervention in order to improve the following deficits and impairments:  Abnormal gait, Decreased knowledge of use of DME, Impaired flexibility, Decreased coordination, Decreased endurance, Decreased range of motion, Decreased strength, Impaired tone, Decreased balance, Decreased knowledge of precautions, Decreased safety awareness, Impaired perceived functional ability, Impaired UE functional use  Visit Diagnosis: Other symptoms and signs involving the nervous system  Other symptoms and signs involving the musculoskeletal system  Abnormal posture  Other lack of coordination    Problem List Patient Active Problem List   Diagnosis Date Noted  . 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 Chronic nonallergic rhinitis 12/29/2016  . Mild persistent asthma, uncomplicated 38/88/2800  . 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 Gastroesophageal reflux disease 12/29/2016  . Cervical spondylosis with myelopathy  and radiculopathy 11/14/2013  . Essential tremor 06/13/2013  . Cervical spondylosis without myelopathy 06/13/2013  . Breast cancer of lower-outer quadrant of left female breast ([place]) 09/10/2010    [name],[name] 03/16/2017, 1:41 PM  [facility] [address] [place], [place], [address] Phone: [phone]   Fax:  [phone]  Name: April Church MRN: [id number] Date of Birth: 09/05/1940

## 2017-03-17 ENCOUNTER — Ambulatory Visit: Payer: Medicare Other | Admitting: Occupational Therapy

## 2017-03-17 ENCOUNTER — Encounter: Payer: Self-pay | Admitting: Occupational Therapy

## 2017-03-17 DIAGNOSIS — R2689 Other abnormalities of gait and mobility: Secondary | ICD-10-CM

## 2017-03-17 DIAGNOSIS — R278 Other lack of coordination: Secondary | ICD-10-CM

## 2017-03-17 DIAGNOSIS — R2681 Unsteadiness on feet: Secondary | ICD-10-CM

## 2017-03-17 DIAGNOSIS — M25511 Pain in right shoulder: Secondary | ICD-10-CM | POA: Diagnosis not present

## 2017-03-17 DIAGNOSIS — R251 Tremor, unspecified: Secondary | ICD-10-CM | POA: Diagnosis not present

## 2017-03-17 DIAGNOSIS — R293 Abnormal posture: Secondary | ICD-10-CM | POA: Diagnosis not present

## 2017-03-17 DIAGNOSIS — R29818 Other symptoms and signs involving the nervous system: Secondary | ICD-10-CM | POA: Diagnosis not present

## 2017-03-17 DIAGNOSIS — R29898 Other symptoms and signs involving the musculoskeletal system: Secondary | ICD-10-CM

## 2017-03-17 DIAGNOSIS — M6281 Muscle weakness (generalized): Secondary | ICD-10-CM

## 2017-03-17 NOTE — Patient Instructions (Signed)
   Ways to prevent future Parkinson's related complications:  1.   Exercise regularly.  Perform your therapy exercises and incorporate safe aerobic exercise when possible (swimming, stationary bike, arm bike, seated stepper) 2.   Focus on BIGGER movements during daily activities- really reach overhead, straighten elbows and extend fingers 3.   When dressing or reaching for your seatbelt make sure to use your body to assist by twisting and looking at where you are reaching while you reach--this can help to minimize stress on the shoulder and reduce the risk of a rotator cuff tear 4.   Swing your arms when you walk!  People with PD are at increased risk for frozen shoulder and swinging your arms can reduce this risk. 5.  Keep you feet apart when you are standing to allow you to have better balance and reach further.  Make sure your feet are apart when you are sitting before you stand up.

## 2017-03-17 NOTE — Therapy (Signed)
[facility] [address] [place], [place], [address] Phone: [phone]   Fax:  [phone]  Occupational Therapy Treatment  Patient Details  Name: April Church MRN: [id number] Date of Birth: Jun 27, 1940 No Data Recorded  Encounter Date: 03/17/2017  OT End of Session - 03/17/17 1022    Visit Number  9    Number of Visits  17    Date for OT Re-Evaluation  04/22/17    Authorization Type  Medicare/BCBS, need g-code    Authorization Time Period  cert. dates 02/21/17-04/22/17    OT Start Time  1020    OT Stop Time  1100    OT Time Calculation (min)  40 min    Activity Tolerance  Patient tolerated treatment well    Behavior During Therapy  WFL for tasks assessed/performed       Past Medical History:  Diagnosis Date  . Asthma    daily inhaler  . Breast cancer ([facility])    left- radiation and surgery -dx. 2011- no further tx. now- [name] , [name]  . Cyst of finger 11/2011   annular cyst right long finger  . Dental crowns present   . Dermatitis   . Frequency of urination   . GERD (gastroesophageal reflux disease)   . Hemorrhoid   . Hyperlipidemia   . Hypertension    under control, has been on med. x 2 yrs.  [name] PONV (postoperative nausea and vomiting)   . Seasonal allergies   . Tremors of nervous system    hands  . Trigger finger of right hand 11/2011   long finger    Past Surgical History:  Procedure Laterality Date  . ANTERIOR CERVICAL DECOMPRESSION/DISCECTOMY FUSION 4 LEVELS N/A 11/14/2013   Procedure: ANTERIOR CERVICAL DECOMPRESSION/DISCECTOMY FUSION 4 LEVELS;  Surgeon: Newman Pies, MD;  Location: [facility];  Service: Neurosurgery;  Laterality: N/A;  C34 C45 C56 C67 anterior cervical fusion with interbody prosthesis plating and  bonegraft  . APPENDECTOMY  age 51  . BREAST LUMPECTOMY  06/16/2009   left; SLN bx.  [name] BREAST LUMPECTOMY  07/01/2009   re-excision  . BREAST SURGERY  1999   reduction   . CATARACT EXTRACTION, BILATERAL    . COLONOSCOPY WITH PROPOFOL N/A 12/03/2014   Procedure: COLONOSCOPY WITH PROPOFOL;  Surgeon: [name], MD;  Location: [facility];  Service: Endoscopy;  Laterality: N/A;  . FOOT SURGERY  06/22/11   left  . KNEE ARTHROSCOPY  03/12/2005   right  . KNEE ARTHROSCOPY     left  . NASAL SINUS SURGERY     x 2  . 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 TRIGGER FINGER RELEASE  12/16/2011   Procedure: RELEASE TRIGGER FINGER/A-1 PULLEY;  Surgeon: Tennis Must, MD;  Location: [facility];  Service: Orthopedics;  Laterality: Right;  RIGHT LONG FINGER TRIGGER RELEASE & ANNULAR CYST EXCISION  . TUMOR EXCISION  age 19   right arm    There were no vitals filed for this visit.  Subjective Assessment - 03/17/17 1020    Subjective   Pt reports that if she takes a pain pill at night then she doesn't wake up with pain.  Starting PT for shoulder Tues.    Pertinent History  Parkinson's disease, essential tremor, hx of L breast CA s/p lumpectomy/radiation in 2011, HTN, GERD, hyperlipidema, ?bursitis R shoulder, hx of R elbow tumor removal, hx of mild scoliosis per  pt, hx of R 3rd digit trigger finger release and cyst removal    Patient Stated Goals  improve coordination for ADLs    Currently in Pain?  No/denies       Placing grooved pegs in pegboard for in-hand manipulation and tip pinch with min v.c. And min difficulty.  Writing with built-up grip and the pencil grip with incr ease with built-up grip (pt has one at home).  Pt with good legibility and size.    Checked remaining goals and discussed progress and plan to d/c OT with pt continuing with HEP and recommendations.  Reviewed recommendations/HEP to continue after d/c.  Pt to schedule therapy screens in approx 6 months.                     OT Education - 03/17/17 1156    Education Details  Ways to prevent future complications/shoulder pain;  [employer] (upcoming events related to PD) including appropriate exercise opportunities; benefits of safe aerobic exercise; Recommendation for screens in approx 74month    Person(s) Educated  Patient    Methods  Explanation;Handout    Comprehension  Verbalized understanding       OT Short Term Goals - 03/17/17 1028      OT SHORT TERM GOAL #1   Title  Pt will be independent with updated HEP--check STGs  03/23/17.    Time  4    Period  Weeks    Status  Achieved      OT SHORT TERM GOAL #2   Title  Pt will improve R grip strength by at least 5lbs for improved ability to hold objects/open containers.    Baseline  R-28lbs    Time  4    Period  Weeks    Status  Not Met 03/10/17:  31lbs      OT SHORT TERM GOAL #3   Title  Pt will improve R lateral and 3point pinch strengths by at least 3lbs each for ADLs.    Baseline  R lateral-7lbs, R 3point 4lbs.    Time  4    Period  Weeks    Status  Not Met 03/10/17:  lateral-7lbs, 3 point pinch 5lbs.  03/17/16:  lateral pinch 8lbs      OT SHORT TERM GOAL #4   Title  Pt will improve ability to dress and prevent future complications by improving PPT#4 by at least 3sec and demo trunk rotation.    Baseline  18sec with decr trunk rotation    Time  4    Period  Weeks    Status  Not Met 03/10/17:  16.68sec with trunk rotation      OT SHORT TERM GOAL #5   Title  Pt will report pain in R scapula consistently less than or equal to 5/10.    Baseline  up to 10/10 at times    Time  4    Period  Weeks    Status  Deferred 03/10/17:  being medically mangaged by ortho MD        OT Long Term Goals - 03/17/17 1027      OT LONG TERM GOAL #1   Title  Pt will verbalize understanding of strategies/AE to incr ease with ADLs (including writing, buttoning/dressing, picking up objects).--check LTGs 04/22/17.    Status  Achieved      OT LONG TERM GOAL #2   Title  Pt will improve coordination for ADLs as shown by improving time on 9-hole peg  test by at least  4sec with R hand.    Baseline  32.56sec    Status  Not Met 50.28      OT LONG TERM GOAL #3   Title  Pt will improve coordination/functional reaching for ADLs as shown by improving score on box and blocks with RUE by at least 4 blocks    Baseline  RUE 41 blocks    Status  Not Met 03/17/17:  38 blocks (but using tip versus lateral pinch now)      OT LONG TERM GOAL #4   Title  Pt will fasten/unfasten 3 buttons on tabletop in 26 sec or less.    Baseline  29.19sec    Status  Not Met 32.24 secs      OT LONG TERM GOAL #5   Title  Pt will improve balance/functional reaching for ADLs/IADLs as shown by improving standing functional reach by at least 2 inches bilaterally.    Baseline  R-6", L-7"    Status  Achieved 03/17/17:  R-11.5, L-11.5"            Plan - 03/17/17 1024    Clinical Impression Statement  Pt has made some slow progress with coordination and use of tip pinch and agrees with d/c at this time.  Pt plans to start Ortho PT next week for shoulder pain.    Rehab Potential  Good    Current Impairments/barriers affecting progress:  R scapular pain with unknown cause    OT Frequency  2x / week    OT Duration  8 weeks    OT Treatment/Interventions  Self-care/ADL training;Moist Heat;Fluidtherapy;DME and/or AE instruction;Splinting;Balance training;Therapeutic activities;Ultrasound;Therapeutic exercise;Cognitive remediation/compensation;Passive range of motion;Functional Mobility Training;Neuromuscular education;Cryotherapy;Patient/family education;Manual Therapy;Energy conservation;Paraffin;Electrical Stimulation    Plan  d/c OT; screens in approx 6 months    OT Home Exercise Plan  Education provided:  red putty HEP for RUE; coordination HEP    Consulted and Agree with Plan of Care  Patient       Patient will benefit from skilled therapeutic intervention in order to improve the following deficits and impairments:  Abnormal gait, Decreased knowledge of use of DME, Impaired flexibility,  Decreased coordination, Decreased endurance, Decreased range of motion, Decreased strength, Impaired tone, Decreased balance, Decreased knowledge of precautions, Decreased safety awareness, Impaired perceived functional ability, Impaired UE functional use  Visit Diagnosis: Other symptoms and signs involving the nervous system  Other symptoms and signs involving the musculoskeletal system  Abnormal posture  Other lack of coordination  Tremor  Unsteadiness  Other abnormalities of gait and mobility  Muscle weakness (generalized)    Problem List Patient Active Problem List   Diagnosis Date Noted  . 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 Chronic nonallergic rhinitis 12/29/2016  . Mild persistent asthma, uncomplicated 56/43/3295  . 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 Gastroesophageal reflux disease 12/29/2016  . Cervical spondylosis with myelopathy and radiculopathy 11/14/2013  . Essential tremor 06/13/2013  . Cervical spondylosis without myelopathy 06/13/2013  . Breast cancer of lower-outer quadrant of left female breast ([facility]) 09/10/2010     OCCUPATIONAL THERAPY DISCHARGE SUMMARY  Visits from Start of Care: 9  Current functional level related to goals / functional outcomes: See above   Remaining deficits: Tremors, decr coordination, bradykinesia, R scapula pain (being addressed by Ortho), decr balance for ADLs, decr strength R hand, decr tip pinch   Education / Equipment: Pt instructed in updated HEP, strengthening for R hand, ways to prevent future complications, strategies for ADLs, PD-related community  exercise.  Pt verbalize understanding of all education provided.   Plan: Patient agrees to discharge.  Patient goals were partially met. Patient is being discharged due to                                                     Reaching maximal functional level at this time.  Pt would benefit from re-evaluation/occupational therapy screen in approx 6 months to assess  for need for further therapy/functional changes due to progressive nature of diagnosis. ?????       [employer] 03/17/2017, 11:58 AM  [facility] [address] [place], [place], [address] Phone: [phone]   Fax:  [phone]  Name: April Church MRN: [id number] Date of Birth: 1941-02-16   Vianne Bulls, OTR/L [facility] [address] [place], [place]  [address] [phone] phone [phone] 03/17/17 11:58 AM

## 2017-03-22 DIAGNOSIS — M2560 Stiffness of unspecified joint, not elsewhere classified: Secondary | ICD-10-CM | POA: Diagnosis not present

## 2017-03-22 DIAGNOSIS — M546 Pain in thoracic spine: Secondary | ICD-10-CM | POA: Diagnosis not present

## 2017-03-22 DIAGNOSIS — M542 Cervicalgia: Secondary | ICD-10-CM | POA: Diagnosis not present

## 2017-03-22 DIAGNOSIS — M6281 Muscle weakness (generalized): Secondary | ICD-10-CM | POA: Diagnosis not present

## 2017-03-24 ENCOUNTER — Encounter: Payer: Self-pay | Admitting: Hematology and Oncology

## 2017-03-24 DIAGNOSIS — Z853 Personal history of malignant neoplasm of breast: Secondary | ICD-10-CM | POA: Diagnosis not present

## 2017-03-24 DIAGNOSIS — R928 Other abnormal and inconclusive findings on diagnostic imaging of breast: Secondary | ICD-10-CM | POA: Diagnosis not present

## 2017-03-29 DIAGNOSIS — M6281 Muscle weakness (generalized): Secondary | ICD-10-CM | POA: Diagnosis not present

## 2017-03-29 DIAGNOSIS — M546 Pain in thoracic spine: Secondary | ICD-10-CM | POA: Diagnosis not present

## 2017-03-29 DIAGNOSIS — M2560 Stiffness of unspecified joint, not elsewhere classified: Secondary | ICD-10-CM | POA: Diagnosis not present

## 2017-03-29 DIAGNOSIS — M542 Cervicalgia: Secondary | ICD-10-CM | POA: Diagnosis not present

## 2017-04-05 DIAGNOSIS — M6281 Muscle weakness (generalized): Secondary | ICD-10-CM | POA: Diagnosis not present

## 2017-04-05 DIAGNOSIS — M542 Cervicalgia: Secondary | ICD-10-CM | POA: Diagnosis not present

## 2017-04-05 DIAGNOSIS — M546 Pain in thoracic spine: Secondary | ICD-10-CM | POA: Diagnosis not present

## 2017-04-05 DIAGNOSIS — M2560 Stiffness of unspecified joint, not elsewhere classified: Secondary | ICD-10-CM | POA: Diagnosis not present

## 2017-04-06 DIAGNOSIS — F329 Major depressive disorder, single episode, unspecified: Secondary | ICD-10-CM | POA: Diagnosis not present

## 2017-04-06 DIAGNOSIS — J45909 Unspecified asthma, uncomplicated: Secondary | ICD-10-CM | POA: Diagnosis not present

## 2017-04-06 DIAGNOSIS — I10 Essential (primary) hypertension: Secondary | ICD-10-CM | POA: Diagnosis not present

## 2017-04-06 DIAGNOSIS — C50512 Malignant neoplasm of lower-outer quadrant of left female breast: Secondary | ICD-10-CM | POA: Diagnosis not present

## 2017-04-06 DIAGNOSIS — E785 Hyperlipidemia, unspecified: Secondary | ICD-10-CM | POA: Diagnosis not present

## 2017-04-07 DIAGNOSIS — M6281 Muscle weakness (generalized): Secondary | ICD-10-CM | POA: Diagnosis not present

## 2017-04-07 DIAGNOSIS — M546 Pain in thoracic spine: Secondary | ICD-10-CM | POA: Diagnosis not present

## 2017-04-07 DIAGNOSIS — M2560 Stiffness of unspecified joint, not elsewhere classified: Secondary | ICD-10-CM | POA: Diagnosis not present

## 2017-04-07 DIAGNOSIS — M542 Cervicalgia: Secondary | ICD-10-CM | POA: Diagnosis not present

## 2017-04-12 DIAGNOSIS — M542 Cervicalgia: Secondary | ICD-10-CM | POA: Diagnosis not present

## 2017-04-12 DIAGNOSIS — M2560 Stiffness of unspecified joint, not elsewhere classified: Secondary | ICD-10-CM | POA: Diagnosis not present

## 2017-04-12 DIAGNOSIS — H524 Presbyopia: Secondary | ICD-10-CM | POA: Diagnosis not present

## 2017-04-12 DIAGNOSIS — M546 Pain in thoracic spine: Secondary | ICD-10-CM | POA: Diagnosis not present

## 2017-04-12 DIAGNOSIS — M6281 Muscle weakness (generalized): Secondary | ICD-10-CM | POA: Diagnosis not present

## 2017-04-12 DIAGNOSIS — Z961 Presence of intraocular lens: Secondary | ICD-10-CM | POA: Diagnosis not present

## 2017-04-14 DIAGNOSIS — M2560 Stiffness of unspecified joint, not elsewhere classified: Secondary | ICD-10-CM | POA: Diagnosis not present

## 2017-04-14 DIAGNOSIS — M542 Cervicalgia: Secondary | ICD-10-CM | POA: Diagnosis not present

## 2017-04-14 DIAGNOSIS — M546 Pain in thoracic spine: Secondary | ICD-10-CM | POA: Diagnosis not present

## 2017-04-14 DIAGNOSIS — M6281 Muscle weakness (generalized): Secondary | ICD-10-CM | POA: Diagnosis not present

## 2017-04-19 DIAGNOSIS — M2560 Stiffness of unspecified joint, not elsewhere classified: Secondary | ICD-10-CM | POA: Diagnosis not present

## 2017-04-19 DIAGNOSIS — M546 Pain in thoracic spine: Secondary | ICD-10-CM | POA: Diagnosis not present

## 2017-04-19 DIAGNOSIS — M542 Cervicalgia: Secondary | ICD-10-CM | POA: Diagnosis not present

## 2017-04-19 DIAGNOSIS — M6281 Muscle weakness (generalized): Secondary | ICD-10-CM | POA: Diagnosis not present

## 2017-04-21 DIAGNOSIS — M542 Cervicalgia: Secondary | ICD-10-CM | POA: Diagnosis not present

## 2017-04-21 DIAGNOSIS — M2560 Stiffness of unspecified joint, not elsewhere classified: Secondary | ICD-10-CM | POA: Diagnosis not present

## 2017-04-21 DIAGNOSIS — M546 Pain in thoracic spine: Secondary | ICD-10-CM | POA: Diagnosis not present

## 2017-04-21 DIAGNOSIS — M6281 Muscle weakness (generalized): Secondary | ICD-10-CM | POA: Diagnosis not present

## 2017-04-28 DIAGNOSIS — M542 Cervicalgia: Secondary | ICD-10-CM | POA: Diagnosis not present

## 2017-04-28 DIAGNOSIS — M2560 Stiffness of unspecified joint, not elsewhere classified: Secondary | ICD-10-CM | POA: Diagnosis not present

## 2017-04-28 DIAGNOSIS — M546 Pain in thoracic spine: Secondary | ICD-10-CM | POA: Diagnosis not present

## 2017-04-28 DIAGNOSIS — M6281 Muscle weakness (generalized): Secondary | ICD-10-CM | POA: Diagnosis not present

## 2017-05-02 DIAGNOSIS — M542 Cervicalgia: Secondary | ICD-10-CM | POA: Diagnosis not present

## 2017-05-02 DIAGNOSIS — M6281 Muscle weakness (generalized): Secondary | ICD-10-CM | POA: Diagnosis not present

## 2017-05-02 DIAGNOSIS — M546 Pain in thoracic spine: Secondary | ICD-10-CM | POA: Diagnosis not present

## 2017-05-02 DIAGNOSIS — M2560 Stiffness of unspecified joint, not elsewhere classified: Secondary | ICD-10-CM | POA: Diagnosis not present

## 2017-05-04 DIAGNOSIS — M546 Pain in thoracic spine: Secondary | ICD-10-CM | POA: Diagnosis not present

## 2017-05-04 DIAGNOSIS — M2560 Stiffness of unspecified joint, not elsewhere classified: Secondary | ICD-10-CM | POA: Diagnosis not present

## 2017-05-04 DIAGNOSIS — M542 Cervicalgia: Secondary | ICD-10-CM | POA: Diagnosis not present

## 2017-05-04 DIAGNOSIS — M6281 Muscle weakness (generalized): Secondary | ICD-10-CM | POA: Diagnosis not present

## 2017-05-10 DIAGNOSIS — M546 Pain in thoracic spine: Secondary | ICD-10-CM | POA: Diagnosis not present

## 2017-05-10 DIAGNOSIS — M542 Cervicalgia: Secondary | ICD-10-CM | POA: Diagnosis not present

## 2017-05-10 DIAGNOSIS — M6281 Muscle weakness (generalized): Secondary | ICD-10-CM | POA: Diagnosis not present

## 2017-05-10 DIAGNOSIS — M2560 Stiffness of unspecified joint, not elsewhere classified: Secondary | ICD-10-CM | POA: Diagnosis not present

## 2017-05-10 DIAGNOSIS — I10 Essential (primary) hypertension: Secondary | ICD-10-CM | POA: Diagnosis not present

## 2017-05-11 DIAGNOSIS — F329 Major depressive disorder, single episode, unspecified: Secondary | ICD-10-CM | POA: Diagnosis not present

## 2017-05-11 DIAGNOSIS — Z0000 Encounter for general adult medical examination without abnormal findings: Secondary | ICD-10-CM | POA: Diagnosis not present

## 2017-05-11 DIAGNOSIS — Z8041 Family history of malignant neoplasm of ovary: Secondary | ICD-10-CM | POA: Diagnosis not present

## 2017-05-11 DIAGNOSIS — I10 Essential (primary) hypertension: Secondary | ICD-10-CM | POA: Diagnosis not present

## 2017-05-11 DIAGNOSIS — C50512 Malignant neoplasm of lower-outer quadrant of left female breast: Secondary | ICD-10-CM | POA: Diagnosis not present

## 2017-05-11 DIAGNOSIS — G20 Parkinson's disease: Secondary | ICD-10-CM | POA: Diagnosis not present

## 2017-05-11 DIAGNOSIS — E785 Hyperlipidemia, unspecified: Secondary | ICD-10-CM | POA: Diagnosis not present

## 2017-05-11 DIAGNOSIS — Z1389 Encounter for screening for other disorder: Secondary | ICD-10-CM | POA: Diagnosis not present

## 2017-05-11 DIAGNOSIS — J45909 Unspecified asthma, uncomplicated: Secondary | ICD-10-CM | POA: Diagnosis not present

## 2017-05-12 DIAGNOSIS — M542 Cervicalgia: Secondary | ICD-10-CM | POA: Diagnosis not present

## 2017-05-12 DIAGNOSIS — M2560 Stiffness of unspecified joint, not elsewhere classified: Secondary | ICD-10-CM | POA: Diagnosis not present

## 2017-05-12 DIAGNOSIS — M546 Pain in thoracic spine: Secondary | ICD-10-CM | POA: Diagnosis not present

## 2017-05-12 DIAGNOSIS — M6281 Muscle weakness (generalized): Secondary | ICD-10-CM | POA: Diagnosis not present

## 2017-05-17 DIAGNOSIS — M2560 Stiffness of unspecified joint, not elsewhere classified: Secondary | ICD-10-CM | POA: Diagnosis not present

## 2017-05-17 DIAGNOSIS — M6281 Muscle weakness (generalized): Secondary | ICD-10-CM | POA: Diagnosis not present

## 2017-05-17 DIAGNOSIS — M546 Pain in thoracic spine: Secondary | ICD-10-CM | POA: Diagnosis not present

## 2017-05-17 DIAGNOSIS — M542 Cervicalgia: Secondary | ICD-10-CM | POA: Diagnosis not present

## 2017-05-19 DIAGNOSIS — M546 Pain in thoracic spine: Secondary | ICD-10-CM | POA: Diagnosis not present

## 2017-05-19 DIAGNOSIS — M6281 Muscle weakness (generalized): Secondary | ICD-10-CM | POA: Diagnosis not present

## 2017-05-19 DIAGNOSIS — M2560 Stiffness of unspecified joint, not elsewhere classified: Secondary | ICD-10-CM | POA: Diagnosis not present

## 2017-05-19 DIAGNOSIS — M542 Cervicalgia: Secondary | ICD-10-CM | POA: Diagnosis not present

## 2017-05-20 ENCOUNTER — Ambulatory Visit: Payer: Medicare Other | Admitting: Diagnostic Neuroimaging

## 2017-05-23 ENCOUNTER — Encounter: Payer: Self-pay | Admitting: Diagnostic Neuroimaging

## 2017-05-23 ENCOUNTER — Ambulatory Visit (INDEPENDENT_AMBULATORY_CARE_PROVIDER_SITE_OTHER): Payer: Medicare Other | Admitting: Diagnostic Neuroimaging

## 2017-05-23 VITALS — BP 127/84 | HR 84 | Ht 60.5 in | Wt 134.2 lb

## 2017-05-23 DIAGNOSIS — G20C Parkinsonism, unspecified: Secondary | ICD-10-CM

## 2017-05-23 DIAGNOSIS — G20 Parkinson's disease: Secondary | ICD-10-CM | POA: Diagnosis not present

## 2017-05-23 DIAGNOSIS — R498 Other voice and resonance disorders: Secondary | ICD-10-CM

## 2017-05-23 DIAGNOSIS — M4722 Other spondylosis with radiculopathy, cervical region: Secondary | ICD-10-CM | POA: Diagnosis not present

## 2017-05-23 DIAGNOSIS — M4712 Other spondylosis with myelopathy, cervical region: Secondary | ICD-10-CM | POA: Diagnosis not present

## 2017-05-23 DIAGNOSIS — G250 Essential tremor: Secondary | ICD-10-CM

## 2017-05-23 MED ORDER — PRIMIDONE 50 MG PO TABS: 100.0000 mg | ORAL_TABLET | Freq: Three times a day (TID) | ORAL | 4 refills | Status: DC

## 2017-05-23 MED ORDER — CARBIDOPA-LEVODOPA 25-100 MG PO TABS: 1.0000 | ORAL_TABLET | Freq: Three times a day (TID) | ORAL | 4 refills | Status: DC

## 2017-05-23 NOTE — Patient Instructions (Addendum)
PARKINSONISM  - continue carb/levo 1 tab three times a day; may add half or 1 full tab extra in AM - caution with walking and balance   ESSENTIAL TREMOR - continue primidone 100mg  three times a day

## 2017-05-23 NOTE — Progress Notes (Addendum)
[facility]  PATIENT: April Church DOB: 1940-07-26  REFERRING CLINICIAN:  HISTORY FROM: patient REASON FOR VISIT: follow up    HISTORICAL  CHIEF COMPLAINT:  Chief Complaint  Patient presents with  . Tremors    rm 6, "I think my tremors have gotten worse; completed another round of OT, will be re-eval in 6 months"  MMSE 25  . Follow-up    6 month    HISTORY OF PRESENT ILLNESS:   UPDATE (05/23/17, VRP): Since last visit, doing well. Tolerating meds. No alleviating or aggravating factors. Tremor slightly worse. Some memory issues. Taking care of own ADLs. No major changes.  UPDATE 11/15/16: Since last visit, overall doing well. Tremor stable. Some memory loss issues continue, but not affecting ADLs. Tolerating meds. No falls. 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   UPDATE 05/10/16: Since last visit, stable. Some mild tremors in left hand. Mild balance issues. Mild memory issues. Cyclobenz helps with night time spasms.   UPDATE 11/12/15: Since last visit, tremors are stable. Balance stable. Now with night time neck, shoulder, spine and leg pain (spasm). Had PT and OT evals as well.   UPDATE 05/13/15: Since last visit, tremor in arms better. Had ST and botox for voice issues. Some wearing off of meds before mid-day dosing. No neck pain issues.   UPDATE 02/12/15: Since last visit, tremor is stable. More voice strangulation issues. Tolerating primidone 100mg  TID.  UPDATE 08/13/14: Since last visit, tried primidone 250mg  BID, but not much tremor benefit; also having more sleepiness in the day time. Still with hand > voice > chin tremor. No falls. Balance getting slightly worse.  UPDATE 05/22/14: Since last visit, tremor progressing. Now notes new resting tremor in the last year. Some balance and walking issues. She is s/p cervical decompression in Sept 2015, with good results.   UPDATE 06/13/13 (CM): She has history of benign essential tremor which is  stable. She also has a vocal tremor. Her tremor does not limit any of her activities of daily life. She denies any side effects of the Mysoline. She also reports today that she's had some numbness and tingling down her right arm from her neck and shoulder. It has been bothering her more in the last 6 months. 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 She saw Dr. Arnoldo Morale back in 2004. She returns for reevaluation  UPDATE 06/07/12 (VRP): Doing well. BUE tremor is stable and controlled. Voice tremor is slightly progressing. Tolerating primidone.  PRIOR HPI (06/09/11, VRP):  77 year old female returns for F/U. Last seen 06/09/10.  She has been followed in this office for many years for essential tremor. She takes primidone 50 mg, 1.5 TID. She tolerates it well. She comes in today saying that her tremor is a little worse before lunch.  The tremor limits her a little bit with eating and writing but for the most part she is happy with her level of function.She has hx of breast cancer.  No neurologic complaints.    REVIEW OF SYSTEMS: Full 14 system review of systems performed and negative except: tremors freq urination aching muscles.    ALLERGIES: Allergies  Allergen Reactions  . Shellfish Allergy Shortness Of Breath  . Duloxetine Itching and Rash  . Sulfa Drugs Cross Reactors Anxiety and Rash  . Codeine Anxiety    HOME MEDICATIONS: Outpatient Medications Prior to Visit  Medication Sig Dispense Refill  . albuterol (PROVENTIL HFA;VENTOLIN HFA)  108 (90 Base) MCG/ACT inhaler Inhale 1 puff every 6 (six) hours as needed into the lungs for wheezing or shortness of breath. 1 Inhaler 3  . amLODipine (NORVASC) 5 MG tablet Take 1 tablet (5 mg total) by mouth 2 (two) times daily.    [name] aspirin 81 MG tablet Take 81 mg by mouth daily.      [name] atorvastatin (LIPITOR) 20 MG tablet Take 20 mg by mouth every other day.     . Azelastine HCl 0.15 % SOLN Place 2  sprays 2 (two) times daily into both nostrils. 30 mL 5  . CALCIUM CARBONATE PO Take 1 tablet by mouth 2 (two) times daily.    . carbidopa-levodopa (SINEMET IR) 25-100 MG tablet Take 1 tablet by mouth 3 (three) times daily before meals. 270 tablet 4  . Cholecalciferol (VITAMIN D) 1000 UNITS capsule Take 1,000 Units by mouth 2 (two) times daily.     . Coenzyme Q10 (CO Q 10) 100 MG CAPS Take by mouth.    . cyclobenzaprine (FLEXERIL) 5 MG tablet Take 1 tablet (5 mg total) by mouth at bedtime. 90 tablet 4  . EPINEPHrine 0.3 mg/0.3 mL IJ SOAJ injection as needed.    . fexofenadine (ALLEGRA) 180 MG tablet Take 180 mg by mouth daily.    . fluticasone (FLONASE) 50 MCG/ACT nasal spray Place 1 spray into both nostrils 2 (two) times daily. 16 g 5  . fluticasone (FLOVENT HFA) 110 MCG/ACT inhaler Inhale 2 puffs 2 (two) times daily into the lungs. 1 Inhaler 3  . ipratropium (ATROVENT) 0.03 % nasal spray Place 2 sprays into both nostrils 2 (two) times daily.    . methylPREDNISolone (MEDROL) 4 MG tablet Take 4 mg by mouth daily.    . Omega-3 Fatty Acids (FISH OIL) 1200 MG CAPS Take 1,200 mg by mouth 2 (two) times daily.     Vladimir Faster Glycol-Propyl Glycol (SYSTANE OP) Place 1 drop into both eyes 2 (two) times daily.    . primidone (MYSOLINE) 50 MG tablet Take 2 tablets (100 mg total) by mouth 3 (three) times daily. 540 tablet 4  . ranitidine (ZANTAC) 150 MG capsule Take 150 mg by mouth daily after breakfast.     . triamcinolone cream (KENALOG) 0.1 % Apply 1 application topically 2 (two) times daily as needed (FOR RASH).     [name] venlafaxine (EFFEXOR) 37.5 MG tablet Take 1 tablet (37.5 mg total) by mouth daily. 90 tablet 3  . traMADol (ULTRAM) 50 MG tablet Take by mouth every 6 (six) hours as needed.    . gabapentin (NEURONTIN) 100 MG capsule Take 100 mg by mouth 3 (three) times daily.     No facility-administered medications prior to visit.     PAST MEDICAL HISTORY: Past Medical History:  Diagnosis Date  .  Asthma    daily inhaler  . Breast cancer ([place])    left- radiation and surgery -dx. 2011- no further tx. now- [name] , [name]  . Cyst of finger 11/2011   annular cyst right long finger  . Dental crowns present   . Dermatitis   . Frequency of urination   . GERD (gastroesophageal reflux disease)   . Hemorrhoid   . Hyperlipidemia   . Hypertension    under control, has been on med. x 2 yrs.  [name] PONV (postoperative nausea and vomiting)   . Seasonal allergies   . Tremors of nervous system    hands  . Trigger finger of right hand  11/2011   long finger    PAST SURGICAL HISTORY: Past Surgical History:  Procedure Laterality Date  . ANTERIOR CERVICAL DECOMPRESSION/DISCECTOMY FUSION 4 LEVELS N/A 11/14/2013   Procedure: ANTERIOR CERVICAL DECOMPRESSION/DISCECTOMY FUSION 4 LEVELS;  Surgeon: Newman Pies, MD;  Location: [facility];  Service: Neurosurgery;  Laterality: N/A;  C34 C45 C56 C67 anterior cervical fusion with interbody prosthesis plating and  bonegraft  . APPENDECTOMY  age 64  . BREAST LUMPECTOMY  06/16/2009   left; SLN bx.  [name] BREAST LUMPECTOMY  07/01/2009   re-excision  . BREAST SURGERY  1999   reduction  . CATARACT EXTRACTION, BILATERAL    . COLONOSCOPY WITH PROPOFOL N/A 12/03/2014   Procedure: COLONOSCOPY WITH PROPOFOL;  Surgeon: [name], MD;  Location: [facility];  Service: Endoscopy;  Laterality: N/A;  . FOOT SURGERY  06/22/11   left  . KNEE ARTHROSCOPY  03/12/2005   right  . KNEE ARTHROSCOPY     left  . NASAL SINUS SURGERY     x 2  . 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 TRIGGER FINGER RELEASE  12/16/2011   Procedure: RELEASE TRIGGER FINGER/A-1 PULLEY;  Surgeon: Tennis Must, MD;  Location: [facility];  Service: Orthopedics;  Laterality: Right;  RIGHT LONG FINGER TRIGGER RELEASE & ANNULAR CYST EXCISION  . TUMOR EXCISION  age 61   right arm    FAMILY HISTORY: Family History  Problem Relation Age  of Onset  . Kidney disease Mother   . Stroke Father   . Stroke Sister        08/2015  . Diabetes Brother   . Heart disease Brother   . Cancer Sister     SOCIAL HISTORY:  Social History   Socioeconomic History  . Marital status: Married    Spouse name: Marcello Moores   . Number of children: 1  . Years of education: HS  . Highest education level: Not on file  Occupational History  . Occupation: Retired  Scientific laboratory technician  . Financial resource strain: Not on file  . Food insecurity:    Worry: Not on file    Inability: Not on file  . Transportation needs:    Medical: Not on file    Non-medical: Not on file  Tobacco Use  . Smoking status: Never Smoker  . Smokeless tobacco: Never Used  Substance and Sexual Activity  . Alcohol use: No    Alcohol/week: 0.0 oz  . Drug use: No  . Sexual activity: Yes  Lifestyle  . Physical activity:    Days per week: Not on file    Minutes per session: Not on file  . Stress: Not on file  Relationships  . Social connections:    Talks on phone: Not on file    Gets together: Not on file    Attends religious service: Not on file    Active member of club or organization: Not on file    Attends meetings of clubs or organizations: Not on file    Relationship status: Not on file  . Intimate partner violence:    Fear of current or ex partner: Not on file    Emotionally abused: Not on file    Physically abused: Not on file    Forced sexual activity: Not on file  Other Topics Concern  . Not on file  Social History Narrative      Pt lives at home with her spouse.  Marcello Moores  Patient has 1 child.    Patient is retired.    Patient has a HS education.    Patient is right handed.    Patient drinks caffeine occasionally.               PHYSICAL EXAM  Vitals:   05/23/17 1522  BP: 127/84  Pulse: 84  Weight: 134 lb 3.2 oz (60.9 kg)  Height: 5' 0.5" (1.537 m)   Body mass index is 25.78 kg/m.  GENERAL EXAM: Patient is in no  distress  CARDIOVASCULAR: Regular rate and rhythm, no murmurs, no carotid bruits  NEUROLOGIC: MENTAL STATUS: awake, alert, language fluent, comprehension intact, naming intact; MASKED FACIES; NEG FRONTAL RELEASE SIGNS MMSE - Mini Mental State Exam 05/23/2017  Orientation to time 5  Orientation to Place 5  Registration 3  Attention/ Calculation 1  Recall 2  Language- name 2 objects 2  Language- repeat 1  Language- follow 3 step command 3  Language- read & follow direction 1  Write a sentence 1  Copy design 1  Total score 25    CRANIAL NERVE: pupils equal and reactive to light, visual fields full to confrontation, extraocular muscles intact, no nystagmus, facial sensation and strength symmetric, uvula midline, shoulder shrug symmetric, tongue midline; SOFT HOARSE VOICE MOTOR: normal bulk, POSTURAL AND ACTION TREMOR; INT REST TREMOR IN RUE > LUE; INT MOUTH TREMOR; BRADYKINESIA IN BUE and BLE; MILD COGWHEELING IN RUE > LUE; full strength in the BUE, BLE; VOICE TREMOR SENSORY: normal and symmetric to light touch COORDINATION: finger-nose-finger, fine finger movements normal REFLEXES: deep tendon reflexes present and symmetric GAIT/STATION: narrow based gait; SLOW AND CAUTIOUS, DECR RIGHT ARM SWING    DIAGNOSTIC DATA (LABS, IMAGING, TESTING) - I reviewed patient records, labs, notes, testing and imaging myself where available.  Lab Results  Component Value Date   WBC 5.2 01/20/2015   HGB 12.0 01/20/2015   HCT 36.1 01/20/2015   MCV 88.4 01/20/2015   PLT 293 01/20/2015      Component Value Date/Time   NA 136 01/20/2015 1326   K 4.1 01/20/2015 1326   CL 100 11/06/2013 1032   CL 102 06/30/2012 1231   CO2 27 01/20/2015 1326   GLUCOSE 66 (L) 01/20/2015 1326   GLUCOSE 77 06/30/2012 1231   BUN 13.9 01/20/2015 1326   CREATININE 0.9 01/20/2015 1326   CALCIUM 8.8 01/20/2015 1326   PROT 7.4 01/20/2015 1326   ALBUMIN 3.6 01/20/2015 1326   AST 17 01/20/2015 1326   ALT 13 01/20/2015  1326   ALKPHOS 86 01/20/2015 1326   BILITOT <0.30 01/20/2015 1326   GFRNONAA 83 (L) 11/06/2013 1032   GFRAA >90 11/06/2013 1032   No results found for: CHOL No results found for: HGBA1C No results found for: VITAMINB12 No results found for: TSH   MRI brain - unremarkable per patient (done ~ early 2000's)  06/25/13 EMG/NCS 1. 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 2. No evidence of widespread underlying large fiber neuropathy.  11/14/13 CXR [I reviewed images myself and agree with interpretation. -VRP]  - Anterior cervical disc fusion localization images as described.    ASSESSMENT AND PLAN  77 y.o. year old female  has a past medical history of Asthma, Breast cancer ([place]), Cyst of finger (11/2011), Dental crowns present, Dermatitis, Frequency of urination, GERD (gastroesophageal reflux disease), Hemorrhoid, Hyperlipidemia, Hypertension, PONV (postoperative nausea and vomiting), Seasonal allergies, Tremors of  nervous system, and Trigger finger of right hand (11/2011). here with essential tremor since 1990's. Now with intermittent resting tremor AND bradykinesia since 2016. May represent essential tremor with superimposed parkinson's disease + cervical myelopathy sequelae.   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)  Essential tremor  Cervical spondylosis with myelopathy and radiculopathy  Parkinsonism, unspecified Parkinsonism type ([place])  Voice tremor     PLAN:  PARKINSONISM  - I ordered DATscan at [facility] (to confirm superimposed parkinsonism on top of essential tremor and cervical myelopathy) but not covered by insurance at this time - continue carb/levo 1 tab three times a day; may add half or 1 full tab extra in AM - caution with walking and balance   ESSENTIAL TREMOR - continue primidone 100mg  three times a  day  SPASMODIC DYSPHONIA - continue ENT / 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 primidone (MYSOLINE) 50 MG tablet    Sig: Take 2 tablets (100 mg total) by mouth 3 (three) times daily.    Dispense:  540 tablet    Refill:  4  . carbidopa-levodopa (SINEMET IR) 25-100 MG tablet    Sig: Take 1 tablet by mouth 3 (three) times daily before meals.    Dispense:  270 tablet    Refill:  4   Return in about 1 year (around 05/24/2018).     Penni Bombard, MD 08/28/7339, 9:37 PM Certified in Neurology, Neurophysiology and Neuroimaging  [facility] [address], [place], [place] [address] [phone]

## 2017-06-02 DIAGNOSIS — D259 Leiomyoma of uterus, unspecified: Secondary | ICD-10-CM | POA: Diagnosis not present

## 2017-06-02 DIAGNOSIS — Z8041 Family history of malignant neoplasm of ovary: Secondary | ICD-10-CM | POA: Diagnosis not present

## 2017-06-22 DIAGNOSIS — H903 Sensorineural hearing loss, bilateral: Secondary | ICD-10-CM | POA: Diagnosis not present

## 2017-06-22 DIAGNOSIS — H9113 Presbycusis, bilateral: Secondary | ICD-10-CM | POA: Diagnosis not present

## 2017-06-29 ENCOUNTER — Ambulatory Visit (INDEPENDENT_AMBULATORY_CARE_PROVIDER_SITE_OTHER): Payer: Medicare Other | Admitting: Allergy

## 2017-06-29 ENCOUNTER — Encounter: Payer: Self-pay | Admitting: Allergy

## 2017-06-29 VITALS — BP 114/86 | HR 90 | Temp 97.5°F | Resp 16 | Ht 61.0 in | Wt 132.8 lb

## 2017-06-29 DIAGNOSIS — J310 Chronic rhinitis: Secondary | ICD-10-CM | POA: Diagnosis not present

## 2017-06-29 DIAGNOSIS — T782XXD Anaphylactic shock, unspecified, subsequent encounter: Secondary | ICD-10-CM | POA: Diagnosis not present

## 2017-06-29 DIAGNOSIS — J4530 Mild persistent asthma, uncomplicated: Secondary | ICD-10-CM | POA: Diagnosis not present

## 2017-06-29 DIAGNOSIS — K219 Gastro-esophageal reflux disease without esophagitis: Secondary | ICD-10-CM

## 2017-06-29 DIAGNOSIS — J3802 Paralysis of vocal cords and larynx, bilateral: Secondary | ICD-10-CM

## 2017-06-29 MED ORDER — BUDESONIDE-FORMOTEROL FUMARATE 160-4.5 MCG/ACT IN AERO: 2.0000 | INHALATION_SPRAY | Freq: Two times a day (BID) | RESPIRATORY_TRACT | 5 refills | Status: DC

## 2017-06-29 NOTE — Progress Notes (Signed)
Follow-up Note  RE: JANAH MCCULLOH MRN: [id number] DOB: [birth year] Date of Office Visit: 06/29/2017   History of present illness: April Church is a 77 y.o. female presenting today for follow-up of asthma, allergic rhinitis, food allergy, GERD.  She also has a history of vocal fold paralysis followed by ENT.  She was last seen in the office on 12/29/16 by myself and NP Ambs.  She denies any major health changes, surgeries or hospitalization since last visit.  She does believe she has had one botox injection since last visit.     She states currently she is having more sneezing in AM, runny nose and cough with pollen exposure.  She was changed from [place] to [place] by her PCP.  She does not feel the xyzal is any more effective than the allegra and states its more expensive.  She has never tried zyrtec.  She is also use astelin 1 spray each nostril twice a day.     With her asthma she states she has had more wheezing in the last month and has been using her albuterol more than 2 times a week with relief of symptoms.  Denies any nighttime awakenings.  She is taking medium dose flovent 1-2 puffs twice a day.       Continues on ranitidine daily for reflux control.     She continues to avoid shellfish and wine and has had no accidental ingestions or need to use her epipen.    Review of systems: Review of Systems  Constitutional: Negative for chills, fever and malaise/fatigue.  HENT: Positive for congestion. Negative for ear discharge, ear pain, nosebleeds and sore throat.   Eyes: Negative for pain, discharge and redness.  Respiratory: Positive for cough and wheezing. Negative for sputum production and shortness of breath.   Cardiovascular: Negative for chest pain.  Gastrointestinal: Negative for abdominal pain, constipation, diarrhea, heartburn, nausea and vomiting.  Musculoskeletal: Negative for joint pain.  Skin: Negative for itching and rash.  Neurological: Negative for headaches.      All other systems negative unless noted above in HPI  Past medical/social/surgical/family history have been reviewed and are unchanged unless specifically indicated below.  No changes  Medication List: Allergies as of 06/29/2017      Reactions   Shellfish Allergy Shortness Of Breath   Duloxetine Itching, Rash   Sulfa Drugs Cross Reactors Anxiety, Rash   Codeine Anxiety      Medication List        Accurate as of 06/29/17  5:01 PM. Always use your most recent med list.          albuterol 108 (90 Base) MCG/ACT inhaler Commonly known as:  PROVENTIL HFA;VENTOLIN HFA Inhale 1 puff every 6 (six) hours as needed into the lungs for wheezing or shortness of breath.   amLODipine 5 MG tablet Commonly known as:  NORVASC Take 1 tablet (5 mg total) by mouth 2 (two) times daily.   aspirin 81 MG tablet Take 81 mg by mouth daily.   atorvastatin 20 MG tablet Commonly known as:  LIPITOR Take 20 mg by mouth every other day.   Azelastine HCl 0.15 % Soln Place 2 sprays 2 (two) times daily into both nostrils.   CALCIUM CARBONATE PO Take 1 tablet by mouth 2 (two) times daily.   carbidopa-levodopa 25-100 MG tablet Commonly known as:  SINEMET IR Take 1 tablet by mouth 3 (three) times daily before meals.   Co Q 10 100 MG Caps Take  by mouth.   cyclobenzaprine 5 MG tablet Commonly known as:  FLEXERIL Take 1 tablet (5 mg total) by mouth at bedtime.   EPINEPHrine 0.3 mg/0.3 mL Soaj injection Commonly known as:  EPI-PEN as needed.   fexofenadine 180 MG tablet Commonly known as:  ALLEGRA Take 180 mg by mouth daily.   Fish Oil 1200 MG Caps Take 1,200 mg by mouth 2 (two) times daily.   fluticasone 110 MCG/ACT inhaler Commonly known as:  FLOVENT HFA Inhale 2 puffs 2 (two) times daily into the lungs.   fluticasone 50 MCG/ACT nasal spray Commonly known as:  FLONASE Place 1 spray into both nostrils 2 (two) times daily.   ipratropium 0.03 % nasal spray Commonly known as:   ATROVENT Place 2 sprays into both nostrils 2 (two) times daily.   methylPREDNISolone 4 MG tablet Commonly known as:  MEDROL Take 4 mg by mouth daily.   primidone 50 MG tablet Commonly known as:  MYSOLINE Take 2 tablets (100 mg total) by mouth 3 (three) times daily.   ranitidine 150 MG capsule Commonly known as:  ZANTAC Take 150 mg by mouth daily after breakfast.   SYSTANE OP Place 1 drop into both eyes 2 (two) times daily.   triamcinolone cream 0.1 % Commonly known as:  KENALOG Apply 1 application topically 2 (two) times daily as needed (FOR RASH).   venlafaxine 37.5 MG tablet Commonly known as:  EFFEXOR Take 1 tablet (37.5 mg total) by mouth daily.   Vitamin D 1000 units capsule Take 1,000 Units by mouth 2 (two) times daily.       Known medication allergies: Allergies  Allergen Reactions  . Shellfish Allergy Shortness Of Breath  . Duloxetine Itching and Rash  . Sulfa Drugs Cross Reactors Anxiety and Rash  . Codeine Anxiety     Physical examination: Blood pressure 114/86, pulse 90, temperature (!) 97.5 F (36.4 C), temperature source Oral, resp. rate 16, height 5' 1" (1.549 m), weight 132 lb 12.8 oz (60.2 kg), SpO2 93 %.  General: Alert, interactive, in no acute distress. HEENT: PERRLA, TMs pearly gray, turbinates mildly edematous with clear discharge, post-pharynx non erythematous. Neck: Supple without lymphadenopathy. Lungs: Clear to auscultation without wheezing, rhonchi or rales. {no increased work of breathing. CV: Normal S1, S2 without murmurs. Abdomen: Nondistended, nontender. Skin: Warm and dry, without lesions or rashes. Extremities:  No clubbing, cyanosis or edema. Neuro:   Grossly intact.  Diagnositics/Labs:  Spirometry: FEV1: 1.27L 72%, FVC: 1.43L  60% c/w with restrictive pattern.  ATS criteria not met.    Assessment and plan:   Anaphylaxis, subsequent encounter Continue avoidance of shellfish and wine Have access to self injectable  epinephrine in case of allergic reaction. Follow    emergency action plan in case of allergic reaction  Chronic nonallergic rhinitis It is likely that her symptoms are irritant effect especially with high pollen activity May try Zyrtec 85m daily.  This can replace Xyzal or Allergra Continue Astelin and increase to 2 sprays in each nostril in the AM and 2 sprays     in each nostril in the PM.  May stop fluticasone nasal spray if your nose is not stuffy  Mild persistent asthma, uncomplicated Stop Flovent at this time Start Symbicort 1685m  2puffs in the AM  and 2 puffs in the PM Continue Proventil HFA 2 puff into the lungs every 4-6 hours as needed for    shortness of breath or wheezing  Vocal fold paralysis, bilateral Continue routine follow-up  with Dr. Joya Gaskins with ENT for periodic Botox injections  GERD Continue ranitidine 150 in the AM  Follow up in 6 months or sooner if needed  I appreciate the opportunity to take part in Chevy's care. Please do not hesitate to contact me with questions.  Sincerely,   Prudy Feeler, MD Allergy/Immunology [facility]

## 2017-06-29 NOTE — Patient Instructions (Addendum)
Anaphylaxis, subsequent encounter Continue avoidance of shellfish and wine Have access to self injectable epinephrine in case of allergic reaction. Follow emergency action plan in case of allergic reaction  Chronic nonallergic rhinitis May try Zyrtec 10mg  daily.  This can replace Xyzal or Allergra Continue Astelin and increase to 2 sprays in each nostril in the AM and 2 sprays in each nostril in the PM.  May stop fluticasone nasal spray if your nose is not stuffy  Mild persistent asthma, uncomplicated Stop Flovent at this time Start Symbicort 163mcg  2puffs in the AM  and 2 puffs in the PM Continue Proventil HFA 2 puff into the lungs every 4-6 hours as needed for shortness of breath or wheezing  Vocal fold paralysis, bilateral Continue routine follow-up with Dr. Joya Gaskins with ENT for periodic Botox injections  GERD Continue ranitidine 150 in the AM  Follow up in 6 months or sooner if needed

## 2017-09-06 DIAGNOSIS — Z8049 Family history of malignant neoplasm of other genital organs: Secondary | ICD-10-CM | POA: Diagnosis not present

## 2017-09-06 DIAGNOSIS — Z124 Encounter for screening for malignant neoplasm of cervix: Secondary | ICD-10-CM | POA: Diagnosis not present

## 2017-10-05 ENCOUNTER — Encounter: Payer: Self-pay | Admitting: Diagnostic Neuroimaging

## 2017-10-10 ENCOUNTER — Telehealth: Payer: Self-pay | Admitting: *Deleted

## 2017-10-10 NOTE — Telephone Encounter (Signed)
Insurance will only cover Ipratropium, Cetirizine, Flunisolide, or Astine. Will you please advise treatment course?

## 2017-10-17 ENCOUNTER — Other Ambulatory Visit: Payer: Self-pay | Admitting: *Deleted

## 2017-10-17 NOTE — Telephone Encounter (Signed)
What medication did the insurance deny?

## 2017-10-18 NOTE — Telephone Encounter (Signed)
I called the pharmacy and they could not see anything on file that was currently denied by insurance.

## 2017-10-20 ENCOUNTER — Ambulatory Visit: Payer: Medicare Other

## 2017-10-20 ENCOUNTER — Ambulatory Visit: Payer: Medicare Other | Admitting: Occupational Therapy

## 2017-10-20 ENCOUNTER — Ambulatory Visit: Payer: Medicare Other | Admitting: Physical Therapy

## 2017-10-20 ENCOUNTER — Ambulatory Visit: Payer: Medicare Other | Attending: Internal Medicine | Admitting: Occupational Therapy

## 2017-10-20 DIAGNOSIS — R2689 Other abnormalities of gait and mobility: Secondary | ICD-10-CM | POA: Insufficient documentation

## 2017-10-20 DIAGNOSIS — R29818 Other symptoms and signs involving the nervous system: Secondary | ICD-10-CM | POA: Insufficient documentation

## 2017-10-20 DIAGNOSIS — R1312 Dysphagia, oropharyngeal phase: Secondary | ICD-10-CM | POA: Insufficient documentation

## 2017-10-20 DIAGNOSIS — R471 Dysarthria and anarthria: Secondary | ICD-10-CM | POA: Insufficient documentation

## 2017-10-20 NOTE — Therapy (Signed)
[facility] [address] [place], [place], [address] Phone: [phone]   Fax:  [phone]  Patient Details  Name: April Church MRN: [id number] Date of Birth: [birth year] Referring Provider:  Leeroy Cha,*  Encounter Date: 10/20/2017   Occupational Therapy Parkinson's Disease Screen  Hand dominance:  right   Fastening/unfastening 3 buttons in:  27.85sec  9-hole peg test:    RUE  47.69 sec        LUE  29.85 sec  Change in ability to perform ADLs/IADLs:  Pt reports incr difficulty using hands and with writing, feels weaker  Other Comments:  Pt reports pain all over, particularly in hands, has been cramps (last 3-4 months).   Lateral pinch-6lbs, 3point pinch-4lbs.  Pt would benefit from occupational therapy evaluation due to  incr difficulty and pain with use of hands.    [facility] 10/20/2017, 10:20 AM  [facility] [address] [place], [place], [address] Phone: [phone]   Fax:  [place], OTR/L [facility] [address] [place], [place]  [address] [phone] phone [phone] 10/20/17 10:21 AM

## 2017-10-20 NOTE — Therapy (Signed)
[facility] [address] [place], [place], [address] Phone: [phone]   Fax:  [phone]  Patient Details  Name: April Church MRN: [id number] Date of Birth: [birth year] Referring Provider:  Leeroy Cha,*  Encounter Date: 10/20/2017   Physical Therapy Parkinson's Disease Screen   Timed Up and Go test: 13.96 sec, then 12.13 sec  10 meter walk test:  11.15 sec = 2.94 ft/sec (slowed from 32.8 ft/sec)  5 time sit to stand test:18.75 sec  Patient would benefit from Physical Therapy evaluation due to slowed mobility measures, increased reports in pain and stiffness.       Donyetta Ogletree W. 10/20/2017, 10:34 AM  Frazier Butt., PT   [facility] [address] [place], [place], [address] Phone: [phone]   Fax:  [phone]

## 2017-10-21 ENCOUNTER — Telehealth: Payer: Self-pay

## 2017-10-21 DIAGNOSIS — G20 Parkinson's disease: Secondary | ICD-10-CM

## 2017-10-21 NOTE — Therapy (Signed)
[facility] [address] [place], [place], [address] Phone: [phone]   Fax:  [phone]  Patient Details  Name: April Church MRN: [id number] Date of Birth: 1940/12/26 Referring Provider:  Curt Bears., MD  Encounter Date: 10/20/2017  Speech Therapy Parkinson's Disease Screen   Decibel Level today: low 70s dB  (WNL=70-72 dB) with sound level meter 30cm away from pt's mouth. Pt's conversational volume is WNL. Pt's voice is moderately tremulous. Pt has not been back to [facility] for a follow up injection of Botox. Pt responded "I know I have to go, I just don't like it." SLP enocuraged pt to think about cost/benefit ratio of going to follow up. Pt reported what appears to be apraxic-like symptoms to SLP. None were observed today in the screening, but SLP provided tongue twisters for pt to practice. If she does not think speech is any better in approx 4 weeks a speech language eval may be warranted at a later date.  Pt has again experienced difficulty in swallowing warranting instrumental evaluation. Another recommendation was made to pt's neurologist for a swallow eval (MBSS or FEES).   [facility] ,[place], [place]  10/21/2017, 1:57 PM  [facility] [address] [place], [place], [address] Phone: [phone]   Fax:  [phone]

## 2017-10-24 NOTE — Telephone Encounter (Signed)
  Orders Placed This Encounter  Procedures  . Ambulatory referral to Physical Therapy  . Ambulatory referral to Occupational Therapy  . SLP eval and treat   Penni Bombard, MD 7/0/9643, 83:81 PM Certified in Neurology, Neurophysiology and [facility] [address] [place], [place] [address] [phone]

## 2017-10-25 ENCOUNTER — Other Ambulatory Visit: Payer: Self-pay | Admitting: Diagnostic Neuroimaging

## 2017-10-25 DIAGNOSIS — R1310 Dysphagia, unspecified: Secondary | ICD-10-CM

## 2017-10-25 NOTE — Telephone Encounter (Addendum)
Dr. Leta Baptist , Please see note below  From [place] Docker he needs to know what swallow you would like to order I will put a list on your desk with order numbers' thanks Hinton Dyer.

## 2017-10-25 NOTE — Progress Notes (Signed)
Orders Placed This Encounter  Procedures  . SLP modified barium swallow

## 2017-10-25 NOTE — Telephone Encounter (Signed)
Dr. Leta Baptist,  has updated order swallow study SLP -1002 . Abigail Butts will call patient to schedule. And update Glendell Docker .

## 2017-10-28 ENCOUNTER — Other Ambulatory Visit (HOSPITAL_COMMUNITY): Payer: Self-pay | Admitting: Diagnostic Neuroimaging

## 2017-10-28 DIAGNOSIS — R1310 Dysphagia, unspecified: Secondary | ICD-10-CM

## 2017-11-07 ENCOUNTER — Encounter (HOSPITAL_COMMUNITY): Payer: Medicare Other

## 2017-11-07 ENCOUNTER — Ambulatory Visit (HOSPITAL_COMMUNITY): Payer: Medicare Other

## 2017-11-22 ENCOUNTER — Ambulatory Visit (HOSPITAL_COMMUNITY)
Admission: RE | Admit: 2017-11-22 | Discharge: 2017-11-22 | Disposition: A | Discharge status: Home / Self Care | Payer: Medicare Other | Source: Ambulatory Visit | Attending: Diagnostic Neuroimaging | Admitting: Diagnostic Neuroimaging

## 2017-11-22 DIAGNOSIS — I10 Essential (primary) hypertension: Secondary | ICD-10-CM | POA: Insufficient documentation

## 2017-11-22 DIAGNOSIS — E785 Hyperlipidemia, unspecified: Secondary | ICD-10-CM | POA: Insufficient documentation

## 2017-11-22 DIAGNOSIS — Z853 Personal history of malignant neoplasm of breast: Secondary | ICD-10-CM | POA: Insufficient documentation

## 2017-11-22 DIAGNOSIS — K219 Gastro-esophageal reflux disease without esophagitis: Secondary | ICD-10-CM | POA: Insufficient documentation

## 2017-11-22 DIAGNOSIS — R1310 Dysphagia, unspecified: Secondary | ICD-10-CM

## 2017-11-22 DIAGNOSIS — G20 Parkinson's disease: Secondary | ICD-10-CM | POA: Diagnosis not present

## 2017-11-22 DIAGNOSIS — J45909 Unspecified asthma, uncomplicated: Secondary | ICD-10-CM | POA: Insufficient documentation

## 2017-11-24 DIAGNOSIS — M25561 Pain in right knee: Secondary | ICD-10-CM | POA: Diagnosis not present

## 2017-11-24 DIAGNOSIS — M25562 Pain in left knee: Secondary | ICD-10-CM | POA: Diagnosis not present

## 2017-11-28 ENCOUNTER — Ambulatory Visit: Payer: Medicare Other | Admitting: Physical Therapy

## 2017-11-28 ENCOUNTER — Ambulatory Visit: Payer: Medicare Other | Admitting: Diagnostic Neuroimaging

## 2017-11-28 ENCOUNTER — Encounter: Payer: Medicare Other | Admitting: Occupational Therapy

## 2017-11-28 ENCOUNTER — Ambulatory Visit: Payer: Medicare Other | Admitting: Occupational Therapy

## 2017-12-06 DIAGNOSIS — E785 Hyperlipidemia, unspecified: Secondary | ICD-10-CM | POA: Diagnosis not present

## 2017-12-06 DIAGNOSIS — Z23 Encounter for immunization: Secondary | ICD-10-CM | POA: Diagnosis not present

## 2017-12-06 DIAGNOSIS — J45909 Unspecified asthma, uncomplicated: Secondary | ICD-10-CM | POA: Diagnosis not present

## 2017-12-06 DIAGNOSIS — G20 Parkinson's disease: Secondary | ICD-10-CM | POA: Diagnosis not present

## 2017-12-06 DIAGNOSIS — G4700 Insomnia, unspecified: Secondary | ICD-10-CM | POA: Diagnosis not present

## 2017-12-06 DIAGNOSIS — C50512 Malignant neoplasm of lower-outer quadrant of left female breast: Secondary | ICD-10-CM | POA: Diagnosis not present

## 2017-12-06 DIAGNOSIS — F329 Major depressive disorder, single episode, unspecified: Secondary | ICD-10-CM | POA: Diagnosis not present

## 2017-12-06 DIAGNOSIS — M542 Cervicalgia: Secondary | ICD-10-CM | POA: Diagnosis not present

## 2017-12-06 DIAGNOSIS — I10 Essential (primary) hypertension: Secondary | ICD-10-CM | POA: Diagnosis not present

## 2017-12-16 DIAGNOSIS — J385 Laryngeal spasm: Secondary | ICD-10-CM | POA: Diagnosis not present

## 2018-01-10 ENCOUNTER — Encounter: Payer: Medicare Other | Admitting: Adult Health

## 2018-01-13 ENCOUNTER — Encounter: Payer: Self-pay | Admitting: Adult Health

## 2018-01-13 ENCOUNTER — Inpatient Hospital Stay: Payer: Medicare Other | Attending: Adult Health | Admitting: Adult Health

## 2018-01-13 VITALS — BP 142/98 | HR 91 | Temp 97.7°F | Resp 18 | Ht 61.0 in | Wt 133.1 lb

## 2018-01-13 DIAGNOSIS — E785 Hyperlipidemia, unspecified: Secondary | ICD-10-CM | POA: Insufficient documentation

## 2018-01-13 DIAGNOSIS — R490 Dysphonia: Secondary | ICD-10-CM

## 2018-01-13 DIAGNOSIS — Z79811 Long term (current) use of aromatase inhibitors: Secondary | ICD-10-CM | POA: Insufficient documentation

## 2018-01-13 DIAGNOSIS — Z79899 Other long term (current) drug therapy: Secondary | ICD-10-CM | POA: Insufficient documentation

## 2018-01-13 DIAGNOSIS — R350 Frequency of micturition: Secondary | ICD-10-CM | POA: Insufficient documentation

## 2018-01-13 DIAGNOSIS — K219 Gastro-esophageal reflux disease without esophagitis: Secondary | ICD-10-CM | POA: Insufficient documentation

## 2018-01-13 DIAGNOSIS — R251 Tremor, unspecified: Secondary | ICD-10-CM | POA: Insufficient documentation

## 2018-01-13 DIAGNOSIS — I10 Essential (primary) hypertension: Secondary | ICD-10-CM | POA: Insufficient documentation

## 2018-01-13 DIAGNOSIS — Z7982 Long term (current) use of aspirin: Secondary | ICD-10-CM | POA: Insufficient documentation

## 2018-01-13 DIAGNOSIS — C50512 Malignant neoplasm of lower-outer quadrant of left female breast: Secondary | ICD-10-CM

## 2018-01-13 DIAGNOSIS — Z170 Estrogen receptor positive status [ER+]: Secondary | ICD-10-CM | POA: Insufficient documentation

## 2018-01-13 NOTE — Progress Notes (Signed)
CLINIC:  Survivorship   REASON FOR VISIT:  Routine follow-up for history of breast cancer.   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    07/23/2009 - 09/03/2009 Radiation Therapy    Adjuvant radiation therapy    10/13/2009 - 10/15/2013 Anti-estrogen oral therapy    Initially letrozole 2.5 mg daily 2 years then switched to tamoxifen then once again switched the Arimidex October 2013      INTERVAL HISTORY:  Ms. Kendra presents to the [facility] today for routine follow-up for her history of breast cancer.  Overall, she reports feeling quite well.  Janisse has benign tremors in her hands and voice box.  She undergoes botox every 6 months injected in her vocal cords.  This impacts her voice.  Recently, her hoarseness has been worse.   Since her last visit, [place] has undergone diagnostic bilateral 3d mammogram that shows a breast density of category b, and no evidence of malignancy.    She sees her PCP regularly.  She does not exercise much.  She is up to date with colon, gyn, and skin cancers.    Keishia notes that her previous scar tissue at her left nipple is worse over the past month or so, and she is concerned about this.  She says it was determined to be scar tissue initially by [facility] with a biopsy.  This was about 7 years ago.  REVIEW OF SYSTEMS:  Review of Systems  Constitutional: Negative for appetite change, chills, fatigue, fever and unexpected weight change.  HENT:   Negative for hearing loss, lump/mass, sore throat and trouble swallowing.   Eyes: Negative for eye problems and icterus.  Respiratory: Negative for chest tightness, cough and shortness of breath.   Cardiovascular: Negative for chest pain, leg swelling and palpitations.  Gastrointestinal: Negative for abdominal  distention, abdominal pain, constipation, diarrhea, nausea and vomiting.  Endocrine: Negative for hot flashes.  Genitourinary: Negative for difficulty urinating.   Musculoskeletal: Negative for arthralgias.  Skin: Negative for itching and rash.  Neurological: Negative for dizziness, extremity weakness and numbness.  Hematological: Negative for adenopathy. Does not bruise/bleed easily.  Psychiatric/Behavioral: Negative for depression. The patient is not nervous/anxious.   Breast: Denies any new nodularity, masses, tenderness, nipple changes, or nipple discharge.       PAST MEDICAL/SURGICAL HISTORY:  Past Medical History:  Diagnosis Date  . Asthma    daily inhaler  . Breast cancer ([facility])    left- radiation and surgery -dx. 2011- no further tx. now- [name] , [name]  . Cyst of finger 11/2011   annular cyst right long finger  . Dental crowns present   . Dermatitis   . Frequency of urination   . GERD (gastroesophageal reflux disease)   . Hemorrhoid   . Hyperlipidemia   . Hypertension    under control, has been on med. x 2 yrs.  [name] PONV (postoperative nausea and vomiting)   . Seasonal allergies   . Tremors of nervous system    hands  . Trigger finger of right hand 11/2011   long finger   Past Surgical History:  Procedure Laterality Date  . ANTERIOR CERVICAL DECOMPRESSION/DISCECTOMY FUSION 4 LEVELS N/A 11/14/2013   Procedure: ANTERIOR CERVICAL DECOMPRESSION/DISCECTOMY FUSION 4 LEVELS;  Surgeon: Newman Pies, MD;  Location: [facility];  Service: Neurosurgery;  Laterality: N/A;  C34 C45 C56 C67 anterior cervical fusion with interbody prosthesis plating and  bonegraft  . APPENDECTOMY  age 9  . BREAST LUMPECTOMY  06/16/2009   left; [facility] bx.  [name] BREAST LUMPECTOMY  07/01/2009   re-excision  . BREAST SURGERY  1999   reduction  . CATARACT EXTRACTION, BILATERAL    . COLONOSCOPY WITH PROPOFOL N/A 12/03/2014   Procedure: COLONOSCOPY WITH PROPOFOL;  Surgeon: [name], MD;   Location: [facility];  Service: Endoscopy;  Laterality: N/A;  . FOOT SURGERY  06/22/11   left  . KNEE ARTHROSCOPY  03/12/2005   right  . KNEE ARTHROSCOPY     left  . NASAL SINUS SURGERY     x 2  . 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 TRIGGER FINGER RELEASE  12/16/2011   Procedure: RELEASE TRIGGER FINGER/A-1 PULLEY;  Surgeon: Tennis Must, MD;  Location: [facility];  Service: Orthopedics;  Laterality: Right;  RIGHT LONG FINGER TRIGGER RELEASE & ANNULAR CYST EXCISION  . TUMOR EXCISION  age 23   right arm     ALLERGIES:  Allergies  Allergen Reactions  . Shellfish Allergy Shortness Of Breath  . Duloxetine Itching and Rash  . Sulfa Drugs Cross Reactors Anxiety and Rash  . Codeine Anxiety     CURRENT MEDICATIONS:  Outpatient Encounter Medications as of 01/13/2018  Medication Sig Note  . albuterol (PROVENTIL HFA;VENTOLIN HFA) 108 (90 Base) MCG/ACT inhaler Inhale 1 puff every 6 (six) hours as needed into the lungs for wheezing or shortness of breath.   [name] amLODipine (NORVASC) 5 MG tablet Take 1 tablet (5 mg total) by mouth 2 (two) times daily.   [name] aspirin 81 MG tablet Take 81 mg by mouth daily.     [name] atorvastatin (LIPITOR) 20 MG tablet Take 20 mg by mouth every other day.    . Azelastine HCl 0.15 % SOLN Place 2 sprays 2 (two) times daily into both nostrils.   . budesonide-formoterol (SYMBICORT) 160-4.5 MCG/ACT inhaler Inhale 2 puffs into the lungs 2 (two) times daily.   [name] CALCIUM CARBONATE PO Take 1 tablet by mouth 2 (two) times daily.   . carbidopa-levodopa (SINEMET IR) 25-100 MG tablet Take 1 tablet by mouth 3 (three) times daily before meals.   . Cholecalciferol (VITAMIN D) 1000 UNITS capsule Take 1,000 Units by mouth 2 (two) times daily.    . Coenzyme Q10 (CO Q 10) 100 MG CAPS Take by mouth.   . EPINEPHrine 0.3 mg/0.3 mL IJ SOAJ injection as needed. 11/12/2015: Received from: [facility] Received Sig: Use  as directed.  . fexofenadine (ALLEGRA) 180 MG tablet Take 180 mg by mouth daily.   . fluticasone (FLONASE) 50 MCG/ACT nasal spray Place 1 spray into both nostrils 2 (two) times daily.   . fluticasone (FLOVENT HFA) 110 MCG/ACT inhaler Inhale 2 puffs 2 (two) times daily into the lungs.   [name] ipratropium (ATROVENT) 0.03 % nasal spray Place 2 sprays into both nostrils 2 (two) times daily.   . methylPREDNISolone (MEDROL) 4 MG tablet Take 4 mg by mouth daily.   . Omega-3 Fatty Acids (FISH OIL) 1200 MG CAPS Take 1,200 mg by mouth 2 (two) times daily.    Vladimir Faster Glycol-Propyl Glycol (SYSTANE OP) Place 1 drop into both eyes 2 (two) times daily.   . primidone (MYSOLINE) 50 MG tablet Take 2 tablets (100 mg total) by mouth 3 (three) times daily.   [name]  ranitidine (ZANTAC) 150 MG capsule Take 150 mg by mouth daily after breakfast.    . triamcinolone cream (KENALOG) 0.1 % Apply 1 application topically 2 (two) times daily as needed (FOR RASH).    [name] venlafaxine (EFFEXOR) 37.5 MG tablet Take 1 tablet (37.5 mg total) by mouth daily.   . [DISCONTINUED] cyclobenzaprine (FLEXERIL) 5 MG tablet Take 1 tablet (5 mg total) by mouth at bedtime. (Patient not taking: Reported on 01/13/2018)    No facility-administered encounter medications on file as of 01/13/2018.      ONCOLOGIC FAMILY HISTORY:  Family History  Problem Relation Age of Onset  . Kidney disease Mother   . Stroke Father   . Stroke Sister        08/2015  . Diabetes Brother   . Heart disease Brother   . Ovarian cancer Sister       SOCIAL HISTORY:  DIARA CHAUDHARI is married and lives with her husband in [place], [place].  She has a daughter who lives in [place], [place].  Ms. Angelucci is currently retired.  She denies any current or history of tobacco, alcohol, or illicit drug use.  (updated 01/13/2018)   PHYSICAL EXAMINATION:  Vital Signs: Vitals:   01/13/18 1055  BP: (!) 142/98  Pulse: 91  Resp: 18  Temp: 97.7 F (36.5 C)  SpO2:  100%   Filed Weights   01/13/18 1055  Weight: 133 lb 1.6 oz (60.4 kg)   General: Well-nourished, well-appearing female in no acute distress.  Unaccompanied today.   HEENT: Head is normocephalic.  Pupils equal and reactive to light. Conjunctivae clear without exudate.  Sclerae anicteric. Oral mucosa is pink, moist.  Oropharynx is pink without lesions or erythema.  Lymph: No cervical, supraclavicular, or infraclavicular lymphadenopathy noted on palpation.  Cardiovascular: Regular rate and rhythm.[name] Respiratory: Clear to auscultation bilaterally. Chest expansion symmetric; breathing non-labored.  Breast Exam:  -Left breast: swelling just underneath left nipple hard, nodular area of about 1cm  -Right breast: No appreciable masses on palpation. No skin redness, thickening, or peau d'orange appearance; no nipple retraction or nipple discharge;  -Axilla: No axillary adenopathy bilaterally.  GI: Abdomen soft and round; non-tender, non-distended. Bowel sounds normoactive. No hepatosplenomegaly.   GU: Deferred.  Neuro: No focal deficits. Steady gait.  Psych: Mood and affect normal and appropriate for situation.  MSK: No focal spinal tenderness to palpation, full range of motion in bilateral upper extremities Extremities: No edema. Skin: Warm and dry.  LABORATORY DATA:  None for this visit   DIAGNOSTIC IMAGING:  Most recent mammogram:   Normal, done at [facility] in 02/2017   ASSESSMENT AND PLAN:  Ms.. Weatherly is a pleasant 77 y.o. female with history of Stage IA left breast invasive ductal carcinoma, ER+/PR+/HER2-, diagnosed in 05/2009, treated with lumpectomy, adjuvant radiation therapy, and anti-estrogen therapy with Letrozole x 2years followed by Tamoxifen, then Anastrozole completing therapy in 09/2013.  [facility] for surveillance and routine follow-up.   1. History of breast cancer:  Ms. Krog is currently clinically and radiographically without evidence of  disease or recurrence of breast cancer. She will be due for mammogram in 02/2018.  She will return in one year for follow up and monitoring.  I did offer her graduation today which she declined.  I did send a message to genetic counseling to see if she is eligible for re evaluation and testing.   I encouraged her to call me with any questions or concerns before her  next visit at the cancer center, and I would be happy to see her sooner, if needed.    2. Bone health:  Given Ms. Stmarie's age, history of breast cancer, she is at risk for bone demineralization. She was given education on specific food and activities to promote bone health.  I recommended she f/u with her PCP regarding future bone density testing and management.  3. Cancer screening:  Due to Ms. Steil's history and her age, she should receive screening for skin cancers, colon cancer. She was encouraged to follow-up with her PCP for appropriate cancer screenings.   4. Health maintenance and wellness promotion: Ms. Lutter was encouraged to consume 5-7 servings of fruits and vegetables per day. She was also encouraged to engage in moderate to vigorous exercise for 30 minutes per day most days of the week. She was instructed to limit her alcohol consumption and continue to abstain from tobacco use.  5.  Nipple worsening: Left breast mammogram and [facility] at [facility].      Dispo:  -Return to cancer center in one year for lts follow up -Mammogram in 02/2018   A total of (30) minutes of face-to-face time was spent with this patient with greater than 50% of that time in counseling and care-coordination.   Gardenia Phlegm, NP Survivorship Program [facility] [phone]   Note: PRIMARY CARE PROVIDER Leeroy Cha, [place] [phone]

## 2018-01-13 NOTE — Patient Instructions (Signed)
Bone Health Bones protect organs, store calcium, and anchor muscles. Good health habits, such as eating nutritious foods and exercising regularly, are important for maintaining healthy bones. They can also help to prevent a condition that causes bones to lose density and become weak and brittle (osteoporosis). Why is bone mass important? Bone mass refers to the amount of bone tissue that you have. The higher your bone mass, the stronger your bones. An important step toward having healthy bones throughout life is to have strong and dense bones during childhood. A young adult who has a high bone mass is more likely to have a high bone mass later in life. Bone mass at its greatest it is called peak bone mass. A large decline in bone mass occurs in older adults. In women, it occurs about the time of menopause. During this time, it is important to practice good health habits, because if more bone is lost than what is replaced, the bones will become less healthy and more likely to break (fracture). If you find that you have a low bone mass, you may be able to prevent osteoporosis or further bone loss by changing your diet and lifestyle. How can I find out if my bone mass is low? Bone mass can be measured with an X-ray test that is called a bone mineral density (BMD) test. This test is recommended for all women who are age 65 or older. It may also be recommended for men who are age 70 or older, or for people who are more likely to develop osteoporosis due to:  Having bones that break easily.  Having a long-term disease that weakens bones, such as kidney disease or rheumatoid arthritis.  Having menopause earlier than normal.  Taking medicine that weakens bones, such as steroids, thyroid hormones, or hormone treatment for breast cancer or prostate cancer.  Smoking.  Drinking three or more alcoholic drinks each day.  What are the nutritional recommendations for healthy bones? To have healthy bones, you  need to get enough of the right minerals and vitamins. Most nutrition experts recommend getting these nutrients from the foods that you eat. Nutritional recommendations vary from person to person. Ask your health care provider what is healthy for you. Here are some general guidelines. Calcium Recommendations Calcium is the most important (essential) mineral for bone health. Most people can get enough calcium from their diet, but supplements may be recommended for people who are at risk for osteoporosis. Good sources of calcium include:  Dairy products, such as low-fat or nonfat milk, cheese, and yogurt.  Dark green leafy vegetables, such as bok choy and broccoli.  Calcium-fortified foods, such as orange juice, cereal, bread, soy beverages, and tofu products.  Nuts, such as almonds.  Follow these recommended amounts for daily calcium intake:  Children, age 1?3: 700 mg.  Children, age 4?8: 1,000 mg.  Children, age 9?13: 1,300 mg.  Teens, age 14?18: 1,300 mg.  Adults, age 19?50: 1,000 mg.  Adults, age 51?70: ? Men: 1,000 mg. ? Women: 1,200 mg.  Adults, age 71 or older: 1,200 mg.  Pregnant and breastfeeding females: ? Teens: 1,300 mg. ? Adults: 1,000 mg.  Vitamin D Recommendations Vitamin D is the most essential vitamin for bone health. It helps the body to absorb calcium. Sunlight stimulates the skin to make vitamin D, so be sure to get enough sunlight. If you live in a cold climate or you do not get outside often, your health care provider may recommend that you take vitamin   D supplements. Good sources of vitamin D in your diet include:  Egg yolks.  Saltwater fish.  Milk and cereal fortified with vitamin D.  Follow these recommended amounts for daily vitamin D intake:  Children and teens, age 1?18: 600 international units.  Adults, age 50 or younger: 400-800 international units.  Adults, age 51 or older: 800-1,000 international units.  Other Nutrients Other nutrients  for bone health include:  Phosphorus. This mineral is found in meat, poultry, dairy foods, nuts, and legumes. The recommended daily intake for adult men and adult women is 700 mg.  Magnesium. This mineral is found in seeds, nuts, dark green vegetables, and legumes. The recommended daily intake for adult men is 400?420 mg. For adult women, it is 310?320 mg.  Vitamin K. This vitamin is found in green leafy vegetables. The recommended daily intake is 120 mg for adult men and 90 mg for adult women.  What type of physical activity is best for building and maintaining healthy bones? Weight-bearing and strength-building activities are important for building and maintaining peak bone mass. Weight-bearing activities cause muscles and bones to work against gravity. Strength-building activities increases muscle strength that supports bones. Weight-bearing and muscle-building activities include:  Walking and hiking.  Jogging and running.  Dancing.  Gym exercises.  Lifting weights.  Tennis and racquetball.  Climbing stairs.  Aerobics.  Adults should get at least 30 minutes of moderate physical activity on most days. Children should get at least 60 minutes of moderate physical activity on most days. Ask your health care provide what type of exercise is best for you. Where can I find more information? For more information, check out the following websites:  National Osteoporosis Foundation: http://nof.org/learn/basics  National Institutes of Health: http://www.niams.nih.gov/Health_Info/Bone/Bone_Health/bone_health_for_life.asp  This information is not intended to replace advice given to you by your health care provider. Make sure you discuss any questions you have with your health care provider. Document Released: 05/01/2003 Document Revised: 08/29/2015 Document Reviewed: 02/13/2014 Elsevier Interactive Patient Education  2018 Elsevier Inc.  

## 2018-01-16 ENCOUNTER — Telehealth: Payer: Self-pay

## 2018-01-16 NOTE — Telephone Encounter (Signed)
-----  Message from Gardenia Phlegm, NP sent at 01/16/2018  7:48 AM EST -----  Please call patient and let her know our recommendations ----- Message ----- From: Clarene Essex, Counselor Sent: 01/13/2018   2:32 PM EST To: Gardenia Phlegm, NP  Yes, the guidelines have changed over the last 8 years.  She would meet criteria now.  Santiago Glad ----- Message ----- From: Gardenia Phlegm, NP Sent: 01/13/2018  11:21 AM EST To: Clarene Essex, Counselor  Patient has sister with ovarian cancer.  She says she previously met with genetic counselor at her diagnosis who told her that genetic testing wasn't indicated, and insurance wouldn't pay for it.  She asked me to reach out and see if this is different since it has been 8 years since she was diagnosed.    Thanks,  Mendel Ryder

## 2018-01-16 NOTE — Telephone Encounter (Signed)
LVM for patient asking to return call. Center number left for call back.

## 2018-01-17 ENCOUNTER — Telehealth: Payer: Self-pay | Admitting: Adult Health

## 2018-01-17 NOTE — Telephone Encounter (Signed)
Called patient per 11/26 sch message to set up genetics appt - no answer - left message for patient to call back if appt was still wanted.

## 2018-01-17 NOTE — Telephone Encounter (Signed)
Patient returned call.  Spoke with her about genetic counseling. She agreed to have an appointment set up. Schedule message sent.

## 2018-01-18 ENCOUNTER — Encounter: Payer: Self-pay | Admitting: Hematology and Oncology

## 2018-01-18 DIAGNOSIS — N6342 Unspecified lump in left breast, subareolar: Secondary | ICD-10-CM | POA: Diagnosis not present

## 2018-01-18 DIAGNOSIS — R921 Mammographic calcification found on diagnostic imaging of breast: Secondary | ICD-10-CM | POA: Diagnosis not present

## 2018-01-18 DIAGNOSIS — Z853 Personal history of malignant neoplasm of breast: Secondary | ICD-10-CM | POA: Diagnosis not present

## 2018-01-31 ENCOUNTER — Encounter: Payer: Self-pay | Admitting: Diagnostic Neuroimaging

## 2018-01-31 ENCOUNTER — Ambulatory Visit (INDEPENDENT_AMBULATORY_CARE_PROVIDER_SITE_OTHER): Payer: Medicare Other | Admitting: Diagnostic Neuroimaging

## 2018-01-31 VITALS — BP 118/82 | HR 96 | Ht 61.0 in | Wt 131.4 lb

## 2018-01-31 DIAGNOSIS — G20 Parkinson's disease: Secondary | ICD-10-CM | POA: Diagnosis not present

## 2018-01-31 DIAGNOSIS — G250 Essential tremor: Secondary | ICD-10-CM | POA: Diagnosis not present

## 2018-01-31 MED ORDER — CARBIDOPA-LEVODOPA 25-100 MG PO TABS: 1.0000 | ORAL_TABLET | Freq: Three times a day (TID) | ORAL | 4 refills | Status: DC

## 2018-01-31 MED ORDER — PRIMIDONE 50 MG PO TABS: 100.0000 mg | ORAL_TABLET | Freq: Three times a day (TID) | ORAL | 4 refills | Status: DC

## 2018-01-31 NOTE — Patient Instructions (Signed)
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

## 2018-01-31 NOTE — Progress Notes (Signed)
[facility]  PATIENT: April Church DOB: 31-Mar-1940  REFERRING CLINICIAN:  HISTORY FROM: patient REASON FOR VISIT: follow up    HISTORICAL  CHIEF COMPLAINT:  Chief Complaint  Patient presents with  . Essential tremor    rm 6 "when I get up my body aches; was going to take more PT/OT but couldn't due to knee issues; will call to reschedule PT/OT"  . Follow-up    requested FU    HISTORY OF PRESENT ILLNESS:   UPDATE (01/31/18, VRP): Since last visit, doing about the same, except slightly more cramps and pain in arms and back. No alleviating or aggravating factors. Tolerating carb/levo.    UPDATE (05/23/17, VRP): Since last visit, doing well. Tolerating meds. No alleviating or aggravating factors. Tremor slightly worse. Some memory issues. Taking care of own ADLs. No major changes.  UPDATE 11/15/16: Since last visit, overall doing well. Tremor stable. Some memory loss issues continue, but not affecting ADLs. Tolerating meds. No falls. 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   UPDATE 05/10/16: Since last visit, stable. Some mild tremors in left hand. Mild balance issues. Mild memory issues. Cyclobenz helps with night time spasms.   UPDATE 11/12/15: Since last visit, tremors are stable. Balance stable. Now with night time neck, shoulder, spine and leg pain (spasm). Had PT and OT evals as well.   UPDATE 05/13/15: Since last visit, tremor in arms better. Had ST and botox for voice issues. Some wearing off of meds before mid-day dosing. No neck pain issues.   UPDATE 02/12/15: Since last visit, tremor is stable. More voice strangulation issues. Tolerating primidone 100mg  TID.  UPDATE 08/13/14: Since last visit, tried primidone 250mg  BID, but not much tremor benefit; also having more sleepiness in the day time. Still with hand > voice > chin tremor. No falls. Balance getting slightly worse.  UPDATE 05/22/14: Since last visit, tremor progressing. Now notes  new resting tremor in the last year. Some balance and walking issues. She is s/p cervical decompression in Sept 2015, with good results.   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 Her tremor does not limit any of her activities of daily life. She denies any side effects of the Mysoline. She also reports today that she's had some numbness and tingling down her right arm from her neck and shoulder. It has been bothering her more in the last 6 months. 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 She saw Dr. Arnoldo Morale back in 2004. She returns for reevaluation  UPDATE 06/07/12 (VRP): Doing well. BUE tremor is stable and controlled. Voice tremor is slightly progressing. Tolerating primidone.  PRIOR HPI (06/09/11, VRP):  77 year old female returns for F/U. Last seen 06/09/10.  She has been followed in this office for many years for essential tremor. She takes primidone 50 mg, 1.5 TID. She tolerates it well. She comes in today saying that her tremor is a little worse before lunch.  The tremor limits her a little bit with eating and writing but for the most part she is happy with her level of function.She has hx of breast cancer.  No neurologic complaints.    REVIEW OF SYSTEMS: Full 14 system review of systems performed and negative except: speech diff food allergies excessive thirst ftigue.  aching muscles.    ALLERGIES: Allergies  Allergen Reactions  . Shellfish Allergy Shortness Of Breath  .  Duloxetine Itching and Rash  . Sulfa Drugs Cross Reactors Anxiety and Rash  . Codeine Anxiety    HOME MEDICATIONS: Outpatient Medications Prior to Visit  Medication Sig Dispense Refill  . albuterol (PROVENTIL HFA;VENTOLIN HFA) 108 (90 Base) MCG/ACT inhaler Inhale 1 puff every 6 (six) hours as needed into the lungs for wheezing or shortness of breath. 1 Inhaler 3  . amLODipine (NORVASC) 5  MG tablet Take 1 tablet (5 mg total) by mouth 2 (two) times daily.    [name] aspirin 81 MG tablet Take 81 mg by mouth daily.      [name] atorvastatin (LIPITOR) 20 MG tablet Take 20 mg by mouth every other day.     . Azelastine HCl 0.15 % SOLN Place 2 sprays 2 (two) times daily into both nostrils. 30 mL 5  . budesonide-formoterol (SYMBICORT) 160-4.5 MCG/ACT inhaler Inhale 2 puffs into the lungs 2 (two) times daily. 1 Inhaler 5  . CALCIUM CARBONATE PO Take 1 tablet by mouth 2 (two) times daily.    . carbidopa-levodopa (SINEMET IR) 25-100 MG tablet Take 1 tablet by mouth 3 (three) times daily before meals. 270 tablet 4  . Cholecalciferol (VITAMIN D) 1000 UNITS capsule Take 1,000 Units by mouth 2 (two) times daily.     . Coenzyme Q10 (CO Q 10) 100 MG CAPS Take by mouth.    . EPINEPHrine 0.3 mg/0.3 mL IJ SOAJ injection as needed.    . fluticasone (FLONASE) 50 MCG/ACT nasal spray Place 1 spray into both nostrils 2 (two) times daily. 16 g 5  . fluticasone (FLOVENT HFA) 110 MCG/ACT inhaler Inhale 2 puffs 2 (two) times daily into the lungs. 1 Inhaler 3  . ipratropium (ATROVENT) 0.03 % nasal spray Place 2 sprays into both nostrils 2 (two) times daily.    . methylPREDNISolone (MEDROL) 4 MG tablet Take 4 mg by mouth daily.    . Omega-3 Fatty Acids (FISH OIL) 1200 MG CAPS Take 1,200 mg by mouth 2 (two) times daily.     Vladimir Faster Glycol-Propyl Glycol (SYSTANE OP) Place 1 drop into both eyes 2 (two) times daily.    . primidone (MYSOLINE) 50 MG tablet Take 2 tablets (100 mg total) by mouth 3 (three) times daily. 540 tablet 4  . triamcinolone cream (KENALOG) 0.1 % Apply 1 application topically 2 (two) times daily as needed (FOR RASH).     [name] venlafaxine (EFFEXOR) 37.5 MG tablet Take 1 tablet (37.5 mg total) by mouth daily. 90 tablet 3  . fexofenadine (ALLEGRA) 180 MG tablet Take 180 mg by mouth daily.    . ranitidine (ZANTAC) 150 MG capsule Take 150 mg by mouth daily after breakfast.      No facility-administered  medications prior to visit.     PAST MEDICAL HISTORY: Past Medical History:  Diagnosis Date  . Asthma    daily inhaler  . Breast cancer ([place])    left- radiation and surgery -dx. 2011- no further tx. now- [name] , [name]  . Cyst of finger 11/2011   annular cyst right long finger  . Dental crowns present   . Dermatitis   . Frequency of urination   . GERD (gastroesophageal reflux disease)   . Hemorrhoid   . Hyperlipidemia   . Hypertension    under control, has been on med. x 2 yrs.  [name] PONV (postoperative nausea and vomiting)   . Seasonal allergies   . Tremors of nervous system    hands  . Trigger finger of  right hand 11/2011   long finger    PAST SURGICAL HISTORY: Past Surgical History:  Procedure Laterality Date  . ANTERIOR CERVICAL DECOMPRESSION/DISCECTOMY FUSION 4 LEVELS N/A 11/14/2013   Procedure: ANTERIOR CERVICAL DECOMPRESSION/DISCECTOMY FUSION 4 LEVELS;  Surgeon: Newman Pies, MD;  Location: [facility];  Service: Neurosurgery;  Laterality: N/A;  C34 C45 C56 C67 anterior cervical fusion with interbody prosthesis plating and  bonegraft  . APPENDECTOMY  age 48  . BREAST LUMPECTOMY  06/16/2009   left; SLN bx.  [name] BREAST LUMPECTOMY  07/01/2009   re-excision  . BREAST SURGERY  1999   reduction  . CATARACT EXTRACTION, BILATERAL    . COLONOSCOPY WITH PROPOFOL N/A 12/03/2014   Procedure: COLONOSCOPY WITH PROPOFOL;  Surgeon: [name], MD;  Location: [facility];  Service: Endoscopy;  Laterality: N/A;  . FOOT SURGERY  06/22/11   left  . KNEE ARTHROSCOPY  03/12/2005   right  . KNEE ARTHROSCOPY     left  . NASAL SINUS SURGERY     x 2  . 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 TRIGGER FINGER RELEASE  12/16/2011   Procedure: RELEASE TRIGGER FINGER/A-1 PULLEY;  Surgeon: Tennis Must, MD;  Location: [facility];  Service: Orthopedics;  Laterality: Right;  RIGHT LONG FINGER TRIGGER RELEASE & ANNULAR CYST EXCISION    . TUMOR EXCISION  age 51   right arm    FAMILY HISTORY: Family History  Problem Relation Age of Onset  . Kidney disease Mother   . Stroke Father   . Stroke Sister        08/2015  . Diabetes Brother   . Heart disease Brother   . Ovarian cancer Sister     SOCIAL HISTORY:  Social History   Socioeconomic History  . Marital status: Married    Spouse name: Marcello Moores   . Number of children: 1  . Years of education: HS  . Highest education level: Not on file  Occupational History  . Occupation: Retired  Scientific laboratory technician  . Financial resource strain: Not on file  . Food insecurity:    Worry: Not on file    Inability: Not on file  . Transportation needs:    Medical: Not on file    Non-medical: Not on file  Tobacco Use  . Smoking status: Never Smoker  . Smokeless tobacco: Never Used  Substance and Sexual Activity  . Alcohol use: No    Alcohol/week: 0.0 standard drinks  . Drug use: No  . Sexual activity: Yes  Lifestyle  . Physical activity:    Days per week: Not on file    Minutes per session: Not on file  . Stress: Not on file  Relationships  . Social connections:    Talks on phone: Not on file    Gets together: Not on file    Attends religious service: Not on file    Active member of club or organization: Not on file    Attends meetings of clubs or organizations: Not on file    Relationship status: Not on file  . Intimate partner violence:    Fear of current or ex partner: Not on file    Emotionally abused: Not on file    Physically abused: Not on file    Forced sexual activity: Not on file  Other Topics Concern  . Not on file  Social History Narrative      Pt lives at home with her  spouse.  [name]    Patient has 1 child.    Patient is retired.    Patient has a HS education.    Patient is right handed.    Patient drinks caffeine occasionally.               PHYSICAL EXAM  Vitals:   01/31/18 1116  BP: 118/82  Pulse: 96  Weight: 131 lb 6.4 oz (59.6 kg)   Height: 5\' 1"  (1.549 m)   Body mass index is 24.83 kg/m.  GENERAL EXAM: Patient is in no distress  CARDIOVASCULAR: Regular rate and rhythm, no murmurs, no carotid bruits  NEUROLOGIC: MENTAL STATUS: awake, alert, language fluent, comprehension intact, naming intact; MASKED FACIES; NEG FRONTAL RELEASE SIGNS MMSE - Mini Mental State Exam 05/23/2017  Orientation to time 5  Orientation to Place 5  Registration 3  Attention/ Calculation 1  Recall 2  Language- name 2 objects 2  Language- repeat 1  Language- follow 3 step command 3  Language- read & follow direction 1  Write a sentence 1  Copy design 1  Total score 25    CRANIAL NERVE: pupils equal and reactive to light, visual fields full to confrontation, extraocular muscles intact, no nystagmus, facial sensation and strength symmetric, uvula midline, shoulder shrug symmetric, tongue midline; SOFT HOARSE VOICE MOTOR: normal bulk, POSTURAL AND ACTION TREMOR; INT REST TREMOR IN RUE > LUE; INT MOUTH TREMOR; BRADYKINESIA IN LUE >> RUE; AND BLE; MILD COGWHEELING IN RUE > LUE; full strength in the BUE, BLE; VOICE TREMOR SENSORY: normal and symmetric to light touch COORDINATION: finger-nose-finger, fine finger movements normal REFLEXES: deep tendon reflexes present and symmetric GAIT/STATION: narrow based gait; SLOW AND CAUTIOUS, DECR RIGHT ARM SWING    DIAGNOSTIC DATA (LABS, IMAGING, TESTING) - I reviewed patient records, labs, notes, testing and imaging myself where available.  Lab Results  Component Value Date   WBC 5.2 01/20/2015   HGB 12.0 01/20/2015   HCT 36.1 01/20/2015   MCV 88.4 01/20/2015   PLT 293 01/20/2015      Component Value Date/Time   NA 136 01/20/2015 1326   K 4.1 01/20/2015 1326   CL 100 11/06/2013 1032   CL 102 06/30/2012 1231   CO2 27 01/20/2015 1326   GLUCOSE 66 (L) 01/20/2015 1326   GLUCOSE 77 06/30/2012 1231   BUN 13.9 01/20/2015 1326   CREATININE 0.9 01/20/2015 1326   CALCIUM 8.8 01/20/2015 1326    PROT 7.4 01/20/2015 1326   ALBUMIN 3.6 01/20/2015 1326   AST 17 01/20/2015 1326   ALT 13 01/20/2015 1326   ALKPHOS 86 01/20/2015 1326   BILITOT <0.30 01/20/2015 1326   GFRNONAA 83 (L) 11/06/2013 1032   GFRAA >90 11/06/2013 1032   No results found for: CHOL No results found for: HGBA1C No results found for: VITAMINB12 No results found for: TSH   MRI brain - unremarkable per patient (done ~ early 2000's)  06/25/13 EMG/NCS 1. 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 2. No evidence of widespread underlying large fiber neuropathy.  11/14/13 CXR [I reviewed images myself and agree with interpretation. -VRP]  - Anterior cervical disc fusion localization images as described.    ASSESSMENT AND PLAN  77 y.o. year old female  has a past medical history of Asthma, Breast cancer ([place]), Cyst of finger (11/2011), Dental crowns present, Dermatitis, Frequency of urination, GERD (gastroesophageal reflux disease), Hemorrhoid, Hyperlipidemia, Hypertension, PONV (  postoperative nausea and vomiting), Seasonal allergies, Tremors of nervous system, and Trigger finger of right hand (11/2011). here with essential tremor since 1990's. Now with intermittent resting tremor AND bradykinesia since 2016. May represent essential tremor with superimposed parkinson's disease + cervical myelopathy sequelae.   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)  Parkinson's disease ([place])  Essential tremor     PLAN:  PARKINSONISM  - I ordered DATscan at [facility] (to confirm superimposed parkinsonism on top of essential tremor and cervical myelopathy) but not covered by insurance at this time - continue carb/levo 1 tab three times a day; may increase up to 2 tabs three times a day  - caution with walking and balance  - consider DBS  ESSENTIAL TREMOR - continue primidone  100mg  three times a day - consider DBS  SPASMODIC DYSPHONIA - continue ENT / 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 carbidopa-levodopa (SINEMET IR) 25-100 MG tablet    Sig: Take 1-2 tablets by mouth 3 (three) times daily before meals.    Dispense:  540 tablet    Refill:  4  . primidone (MYSOLINE) 50 MG tablet    Sig: Take 2 tablets (100 mg total) by mouth 3 (three) times daily.    Dispense:  540 tablet    Refill:  4   Return in about 6 months (around 08/02/2018).     Penni Bombard, MD 97/53/0051, 10:21 PM Certified in Neurology, Neurophysiology and Neuroimaging  [facility] [address], [place], [place] [address] [phone]

## 2018-02-27 ENCOUNTER — Ambulatory Visit: Payer: Medicare Other | Attending: Internal Medicine | Admitting: Occupational Therapy

## 2018-02-27 ENCOUNTER — Telehealth: Payer: Self-pay | Admitting: *Deleted

## 2018-02-27 ENCOUNTER — Encounter: Payer: Self-pay | Admitting: Occupational Therapy

## 2018-02-27 ENCOUNTER — Ambulatory Visit: Payer: Medicare Other | Admitting: Physical Therapy

## 2018-02-27 DIAGNOSIS — G20 Parkinson's disease: Secondary | ICD-10-CM

## 2018-02-27 DIAGNOSIS — M6281 Muscle weakness (generalized): Secondary | ICD-10-CM | POA: Insufficient documentation

## 2018-02-27 DIAGNOSIS — R278 Other lack of coordination: Secondary | ICD-10-CM | POA: Diagnosis not present

## 2018-02-27 DIAGNOSIS — R293 Abnormal posture: Secondary | ICD-10-CM | POA: Insufficient documentation

## 2018-02-27 DIAGNOSIS — R2689 Other abnormalities of gait and mobility: Secondary | ICD-10-CM | POA: Diagnosis not present

## 2018-02-27 DIAGNOSIS — R29898 Other symptoms and signs involving the musculoskeletal system: Secondary | ICD-10-CM | POA: Diagnosis not present

## 2018-02-27 DIAGNOSIS — R29818 Other symptoms and signs involving the nervous system: Secondary | ICD-10-CM | POA: Insufficient documentation

## 2018-02-27 DIAGNOSIS — R251 Tremor, unspecified: Secondary | ICD-10-CM | POA: Insufficient documentation

## 2018-02-27 DIAGNOSIS — R2681 Unsteadiness on feet: Secondary | ICD-10-CM | POA: Diagnosis not present

## 2018-02-27 NOTE — Telephone Encounter (Signed)
Orders Placed This Encounter  Procedures  . Ambulatory referral to Neurology    Penni Bombard, MD 04/24/231, 4:35 PM Certified in Neurology, Neurophysiology and Neuroimaging  [facility] [address] [place], [place] [address] [phone]

## 2018-02-27 NOTE — Telephone Encounter (Signed)
The patient came into office stating that Dr Leta Baptist suggested she may want to get referral to discuss deep brain stimulator. She would like the referral placed to either a physician in [place] or at [facility]. This RN stated will let Dr Leta Baptist know. She will get a call to set up appointment; advised she can call and check on referral if she has not gotten a call in 1-2 weeks. She verbalized understanding, appreciation.

## 2018-02-28 NOTE — Therapy (Signed)
[place] [address] [place], [place], [address] Phone: [phone]   Fax:  [phone]  Occupational Therapy Evaluation  Patient Details  Name: April Church MRN: [id number] Date of Birth: 1940-10-16 Referring Provider (OT): Dr. Andrey Spearman   Encounter Date: 02/27/2018  OT End of Session - 02/28/18 0946    Visit Number  1    Number of Visits  17    Date for OT Re-Evaluation  05/28/18    Authorization Type  Medicare & BCBS, covered 100%     Authorization - Visit Number  1    Authorization - Number of Visits  10    OT Start Time  1153    OT Stop Time  1235    OT Time Calculation (min)  42 min    Activity Tolerance  Patient tolerated treatment well    Behavior During Therapy  [facility] for tasks assessed/performed       Past Medical History:  Diagnosis Date  . Asthma    daily inhaler  . Breast cancer ([place])    left- radiation and surgery -dx. 2011- no further tx. now- [name] , [name]  . Cyst of finger 11/2011   annular cyst right long finger  . Dental crowns present   . Dermatitis   . Frequency of urination   . GERD (gastroesophageal reflux disease)   . Hemorrhoid   . Hyperlipidemia   . Hypertension    under control, has been on med. x 2 yrs.  [name] PONV (postoperative nausea and vomiting)   . Seasonal allergies   . Tremors of nervous system    hands  . Trigger finger of right hand 11/2011   long finger    Past Surgical History:  Procedure Laterality Date  . ANTERIOR CERVICAL DECOMPRESSION/DISCECTOMY FUSION 4 LEVELS N/A 11/14/2013   Procedure: ANTERIOR CERVICAL DECOMPRESSION/DISCECTOMY FUSION 4 LEVELS;  Surgeon: Newman Pies, MD;  Location: [place] [facility];  Service: Neurosurgery;  Laterality: N/A;  C34 C45 C56 C67 anterior cervical fusion with interbody prosthesis plating and  bonegraft  . APPENDECTOMY  age 40  . BREAST LUMPECTOMY  06/16/2009   left; SLN bx.  [name] BREAST LUMPECTOMY  07/01/2009    re-excision  . BREAST SURGERY  1999   reduction  . CATARACT EXTRACTION, BILATERAL    . COLONOSCOPY WITH PROPOFOL N/A 12/03/2014   Procedure: COLONOSCOPY WITH PROPOFOL;  Surgeon: [name], MD;  Location: [facility];  Service: Endoscopy;  Laterality: N/A;  . FOOT SURGERY  06/22/11   left  . KNEE ARTHROSCOPY  03/12/2005   right  . KNEE ARTHROSCOPY     left  . NASAL SINUS SURGERY     x 2  . 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 TRIGGER FINGER RELEASE  12/16/2011   Procedure: RELEASE TRIGGER FINGER/A-1 PULLEY;  Surgeon: Tennis Must, MD;  Location: [facility];  Service: Orthopedics;  Laterality: Right;  RIGHT LONG FINGER TRIGGER RELEASE & ANNULAR CYST EXCISION  . TUMOR EXCISION  age 61   right arm    There were no vitals filed for this visit.  Subjective Assessment - 02/27/18 1157    Subjective   Pt reports that Dr. Tish Frederickson talked to her about referral for DBS consultation    Pertinent History  Parkinson's disease; hx of Kidney donor, hx of L breast CA, ET, cervical spondylosis and cervical decompression/fusion surgery, hx of R 3rd digit trigger finger  release  and cyst, HTN, hyperlipidemia, hx of RUE tumor excision     Patient Stated Goals  improve ability to pick up coins/pils with R hand, writing, use of utensils    Currently in Pain?  No/denies        [facility] OT Assessment - 02/28/18 0001      Assessment   Medical Diagnosis  Parkinson's disease    Referring Provider (OT)  Dr. Andrey Spearman    Onset Date/Surgical Date  10/20/17   PD screens   Hand Dominance  Right    Prior Therapy  last OT d/c 03/17/17      Precautions   Precautions  Fall      Balance Screen   Has the patient fallen in the past 6 months  No      Home  Environment   Family/patient expects to be discharged to:  Private residence    Living Arrangements  --   spouse     ADL   Eating/Feeding  --   50% with R hand, 50% with L hand    Grooming  --   min difficulty   Upper Body Dressing  --   incr time buttons   ADL comments  Pt reports drops things frequently when putting dishes away if she doesn't use BUEs, has difficulty opening containers/packages, difficulty picking up coins/utensils, difficulty writing.  All ADLs mod I       IADL   Shopping  Takes care of all shopping needs independently    Light Housekeeping  Performs light daily tasks such as dishwashing, bed making   cleaning person for heavier tasks   Meal Prep  Plans, prepares and serves adequate meals independently    Investment banker, corporate own vehicle    Medication Management  --   difficulty picking up pills    Financial Management  Manages financial matters independently (budgets, writes checks, pays rent, bills goes to bank), collects and keeps track of income   most bill paying online     Written Expression   Dominant Hand  Right    Handwriting  90% legible;Increased time   tremors noted     Cognition   Overall Cognitive Status  Within Functional Limits for tasks assessed    Bradyphrenia  Yes   per pt    Cognition Comments  pt reports mild memory deficits, but no change in last year      Observation/Other Assessments   Other Surveys   Select    Physical Performance Test    Yes    Simulated Eating Time (seconds)  21.44    Simulated Eating Comments  mod tremor (action), holds spoon at end    Donning [place]offing Jacket Time (seconds)  11.50    Donning Doffing Jacket Comments  fastening/unfastening 3 buttons in 32.82sec      Coordination   9 Hole Peg Test  Right;Left    Right 9 Hole Peg Test  39.47    Left 9 Hole Peg Test  32.28    Box and Blocks  R-38blocks, L-42blocks    Tremors  moderate action tremors LUE>RUE    Coordination  pt uses lateral pinch with R hand to pick up items      Tone   Assessment Location  Right Upper Extremity;Left Upper Extremity      ROM / Strength   AROM / PROM / Strength  AROM      AROM   Overall AROM    Deficits  Overall AROM Comments  Bilateral UEs grossly WNL except R shoulder flex 130* with -15* elbow ext (L 145*), and decr ability for tip pinch with R hand with muscle wasting noted at hyperthenar eminance      Hand Function   Right Hand Grip (lbs)  33    Right Hand Lateral Pinch  7.5 lbs    Right Hand 3 Point Pinch  3.5 lbs   7 point, no true tip pinch   Left Hand Grip (lbs)  40    Left Hand Lateral Pinch  8.5 lbs    Left 3 point pinch  10 lbs   7.5 tip     RUE Tone   RUE Tone  Mild      LUE Tone   LUE Tone  Within Functional Limits                      OT Education - 02/28/18 0945    Education Details  OT eval results & POC    Person(s) Educated  Patient    Methods  Explanation    Comprehension  Verbalized understanding       OT Short Term Goals - 02/28/18 1152      OT SHORT TERM GOAL #1   Title  Pt will be independent with updated HEP--check STGs  03/29/18.    Time  4    Period  Weeks    Status  New      OT SHORT TERM GOAL #2   Title  Pt will be able to write short paragraph with 100% legibility using strategies/AE prn.    Baseline  -    Time  4    Period  Days    Status  New      OT SHORT TERM GOAL #3   Title  Pt will demo incr ease with use of utensils as shown by improving PPT#2 to less than 19sec.    Baseline  -    Period  Weeks    Status  New      OT SHORT TERM GOAL #4   Title  Pt will be able to fasten/unfasten 3 buttons in less than 30sec.    Baseline  -    Time  4    Period  Weeks    Status  New      OT SHORT TERM GOAL #5   Title  Pt will be able to pick up small objects using tip pinch at least 75% of the time with R hand.    Baseline  uses lateral pinch    Time  4    Period  Weeks    Status  New        OT Long Term Goals - 02/28/18 1156      OT LONG TERM GOAL #1   Title  Pt will verbalize understanding of strategies/AE to incr ease with ADLs (including writing, buttoning/dressing, picking up objects).--check LTGs  04/27/18.    Time  8    Period  Weeks    Status  New      OT LONG TERM GOAL #2   Title  Pt will improve coordination for ADLs as shown by improving time on 9-hole peg test by at least 4sec with R hand.    Baseline  39.47sec    Time  8    Period  Weeks    Status  New      OT LONG TERM GOAL #3  Title  Pt will improve coordination/functional reaching for ADLs as shown by improving score on box and blocks with RUE by at least 4 blocks    Baseline  RUE 38 blocks    Time  8    Period  Weeks    Status  New      OT LONG TERM GOAL #4   Title  Pt will improve R grip strength by at least 4lbs to assist in opening containers.    Baseline  33lbs    Time  8    Period  Weeks    Status  New      OT LONG TERM GOAL #5   Title  Pt will demo at least 140* R shoulder flex with -10* elbow ext for functional reaching for ADLs.    Baseline  130* with -15* elbow ext    Time  8    Period  Weeks    Status  New            Plan - 02/28/18 0953    Clinical Impression Statement  Pt is a 78 y.o. female diagnosed with Parkinson's disease.  Pt is familiar to this therapist from previous OT.  OT was recommended after PD screen 10/20/17, but was delayed at pt request due to having injections.  Pt with PMH that includes:  hx of Kidney donor, hx of L breast CA, ET, cervical spondylosis and cervical decompression/fusion surgery, hx of R 3rd digit trigger finger release  and cyst, HTN, hyperlipidemia, hx of RUE tumor excision.  Pt presents today with bradykinesia, rigidity, tremor, decr coordination, decr posture, decr balance/functional mobility for ADLs, R hand weakness.  Pt would benefit from occupational therapy to address these deficits for improved dominant R hand functional use, improved ease/efficiency with ADLs/IADLs, and to reduce risk for future complications.    Occupational Profile and client history currently impacting functional performance  Pt is mod I for ADLs/IADLs, but reports that she is using L  nondominant UE more for ADLs due to incr difficulty with RUE.      Occupational performance deficits (Please refer to evaluation for details):  ADL's;IADL's;Leisure;Social Participation    Rehab Potential  Good    OT Frequency  2x / week    OT Duration  8 weeks   +eval   OT Treatment/Interventions  Aquatic Therapy;Paraffin;Neuromuscular education;Splinting;Patient/family education;Therapeutic activities;Functional Mobility Training;Energy conservation;Fluidtherapy;Cryotherapy;Ultrasound;DME and/or AE instruction;Manual Therapy;Passive range of motion;Cognitive remediation/compensation;Therapeutic exercise;Self-care/ADL training;Moist Heat    Plan  review/update coordination HEP, R hand strength    Clinical Decision Making  Several treatment options, min-mod task modification necessary    Recommended Other Services  scheduled for PT eval    Consulted and Agree with Plan of Care  Patient       Patient will benefit from skilled therapeutic intervention in order to improve the following deficits and impairments:  Decreased cognition, Decreased mobility, Decreased coordination, Decreased activity tolerance, Decreased range of motion, Decreased strength, Impaired tone, Improper spinal/pelvic alignment, Impaired UE functional use, Impaired perceived functional ability, Decreased balance(bradykinesia)  Visit Diagnosis: Other lack of coordination  Other symptoms and signs involving the nervous system  Other symptoms and signs involving the musculoskeletal system  Muscle weakness (generalized)  Other abnormalities of gait and mobility  Unsteadiness  Tremor  Abnormal posture    Problem List Patient Active Problem List   Diagnosis Date Noted  . 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 Chronic nonallergic rhinitis 12/29/2016  . Mild persistent asthma, uncomplicated 34/19/3790  . 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 Gastroesophageal reflux disease 12/29/2016  . Cervical spondylosis  with myelopathy and radiculopathy 11/14/2013  . Essential tremor 06/13/2013  . Cervical spondylosis without myelopathy 06/13/2013  . Breast cancer of lower-outer quadrant of left female breast ([place]) 09/10/2010    [facility] 02/28/2018, 12:00 PM  [place] [address] [place], [place], [address] Phone: [phone]   Fax:  [phone]  Name: April Church MRN: [id number] Date of Birth: [birth year]   Vianne Bulls, OTR/L [facility] [address] [place], [place]  [address] [phone] phone [phone] 02/28/18 12:01 PM

## 2018-03-08 ENCOUNTER — Ambulatory Visit: Payer: Medicare Other | Admitting: Occupational Therapy

## 2018-03-08 ENCOUNTER — Encounter: Payer: Self-pay | Admitting: Physical Therapy

## 2018-03-08 ENCOUNTER — Ambulatory Visit: Payer: Medicare Other | Admitting: Physical Therapy

## 2018-03-08 DIAGNOSIS — R29898 Other symptoms and signs involving the musculoskeletal system: Secondary | ICD-10-CM

## 2018-03-08 DIAGNOSIS — R29818 Other symptoms and signs involving the nervous system: Secondary | ICD-10-CM

## 2018-03-08 DIAGNOSIS — R2689 Other abnormalities of gait and mobility: Secondary | ICD-10-CM | POA: Diagnosis not present

## 2018-03-08 DIAGNOSIS — R2681 Unsteadiness on feet: Secondary | ICD-10-CM

## 2018-03-08 DIAGNOSIS — M6281 Muscle weakness (generalized): Secondary | ICD-10-CM | POA: Diagnosis not present

## 2018-03-08 DIAGNOSIS — R278 Other lack of coordination: Secondary | ICD-10-CM | POA: Diagnosis not present

## 2018-03-08 NOTE — Patient Instructions (Signed)
Coordination Exercises  Perform the following exercises for 20 minutes 1 times per day. Perform with both hand(s). Perform using big movements.   Flipping Cards: Place deck of cards on the table. Flip cards over by opening your hand big to grasp and then turn your palm up big.  Deal cards: Hold 1/2 or whole deck in your hand. Use thumb to push card off top of deck with one big push.  Rotate ball with fingertips: Pick up with fingers/thumb and move as much as you can with each turn/movement (clockwise and counter-clockwise).  Toss ball from one hand to the other: Toss big/high.  Pick up coins and place in coin bank or container: Pick up with big, intentional movements. Do not drag coin to the edge.  Pick up 5-10 coins one at a time and hold in palm. Then, move coins from palm to fingertips one at time and place in coin bank/container.  Perform "Flicks"/hand stretches (PWR! Hands): Close hands then flick out your fingers with focus on opening hands, pulling wrists back, and extending elbows like you are pushing.     1. Grip Strengthening (Resistive Putty)   Squeeze putty using thumb and all fingers. Repeat _20___ times. Do __2__ sessions per day.   2. Roll putty into tube on table and pinch between each finger and thumb x 10 reps each. (can do ring and small finger together)  3. Repeat with key pinch 10 reps each hand     Copyright  VHI. All rights reserved.

## 2018-03-08 NOTE — Therapy (Addendum)
[place] [address] [place], [place], [address] Phone: [phone]   Fax:  [phone]  Occupational Therapy Treatment  Patient Details  Name: April Church MRN: [id number] Date of Birth: Oct 20, 1940 Referring Provider (OT): Dr. Andrey Spearman   Encounter Date: 03/08/2018  OT End of Session - 03/08/18 1115    Visit Number  2    Number of Visits  17    Date for OT Re-Evaluation  05/28/18    Authorization Type  Medicare & BCBS, covered 100%     Authorization - Visit Number  2    Authorization - Number of Visits  10    OT Start Time  0935    OT Stop Time  1015    OT Time Calculation (min)  40 min    Activity Tolerance  Patient tolerated treatment well    Behavior During Therapy  [facility] for tasks assessed/performed       Past Medical History:  Diagnosis Date  . Asthma    daily inhaler  . Breast cancer ([place])    left- radiation and surgery -dx. 2011- no further tx. now- [name] , [name]  . Cyst of finger 11/2011   annular cyst right long finger  . Dental crowns present   . Dermatitis   . Frequency of urination   . GERD (gastroesophageal reflux disease)   . Hemorrhoid   . Hyperlipidemia   . Hypertension    under control, has been on med. x 2 yrs.  [name] PONV (postoperative nausea and vomiting)   . Seasonal allergies   . Tremors of nervous system    hands  . Trigger finger of right hand 11/2011   long finger    Past Surgical History:  Procedure Laterality Date  . ANTERIOR CERVICAL DECOMPRESSION/DISCECTOMY FUSION 4 LEVELS N/A 11/14/2013   Procedure: ANTERIOR CERVICAL DECOMPRESSION/DISCECTOMY FUSION 4 LEVELS;  Surgeon: Newman Pies, MD;  Location: [place] [facility];  Service: Neurosurgery;  Laterality: N/A;  C34 C45 C56 C67 anterior cervical fusion with interbody prosthesis plating and  bonegraft  . APPENDECTOMY  age 74  . BREAST LUMPECTOMY  06/16/2009   left; SLN bx.  [name] BREAST LUMPECTOMY  07/01/2009    re-excision  . BREAST SURGERY  1999   reduction  . CATARACT EXTRACTION, BILATERAL    . COLONOSCOPY WITH PROPOFOL N/A 12/03/2014   Procedure: COLONOSCOPY WITH PROPOFOL;  Surgeon: [name], MD;  Location: [facility];  Service: Endoscopy;  Laterality: N/A;  . FOOT SURGERY  06/22/11   left  . KNEE ARTHROSCOPY  03/12/2005   right  . KNEE ARTHROSCOPY     left  . NASAL SINUS SURGERY     x 2  . 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 TRIGGER FINGER RELEASE  12/16/2011   Procedure: RELEASE TRIGGER FINGER/A-1 PULLEY;  Surgeon: Tennis Must, MD;  Location: [place];  Service: Orthopedics;  Laterality: Right;  RIGHT LONG FINGER TRIGGER RELEASE & ANNULAR CYST EXCISION  . TUMOR EXCISION  age 63   right arm    There were no vitals filed for this visit.  Subjective Assessment - 03/08/18 0936    Pertinent History  Parkinson's disease; hx of Kidney donor, hx of L breast CA, ET, cervical spondylosis and cervical decompression/fusion surgery, hx of R 3rd digit trigger finger release  and cyst, HTN, hyperlipidemia, hx of RUE tumor excision     Patient Stated Goals  improve ability to pick up coins/pils with R hand, writing, use of utensils    Currently in Pain?  No/denies                 Treatment: PWR! Hands basic  4, 10 reps each. Pt was educated regarding initial HEP, see pt instructions. Pt reports she is schedule for an appointment to see if she is a candidate for DBS. Therapist will try to provide some information for pt. At next visit.           OT Education - 03/08/18 1119    Education Details  intial HEP for coordination and hand strength    Person(s) Educated  Patient    Methods  Explanation;Demonstration;Verbal cues;Handout    Comprehension  Verbalized understanding;Returned demonstration;Verbal cues required       OT Short Term Goals - 02/28/18 1152      OT SHORT TERM GOAL #1   Title  Pt will be  independent with updated HEP--check STGs  03/29/18.    Time  4    Period  Weeks    Status  New      OT SHORT TERM GOAL #2   Title  Pt will be able to write short paragraph with 100% legibility using strategies/AE prn.    Baseline  -    Time  4    Period  Days    Status  New      OT SHORT TERM GOAL #3   Title  Pt will demo incr ease with use of utensils as shown by improving PPT#2 to less than 19sec.    Baseline  -    Period  Weeks    Status  New      OT SHORT TERM GOAL #4   Title  Pt will be able to fasten/unfasten 3 buttons in less than 30sec.    Baseline  -    Time  4    Period  Weeks    Status  New      OT SHORT TERM GOAL #5   Title  Pt will be able to pick up small objects using tip pinch at least 75% of the time with R hand.    Baseline  uses lateral pinch    Time  4    Period  Weeks    Status  New        OT Long Term Goals - 02/28/18 1156      OT LONG TERM GOAL #1   Title  Pt will verbalize understanding of strategies/AE to incr ease with ADLs (including writing, buttoning/dressing, picking up objects).--check LTGs 04/27/18.    Time  8    Period  Weeks    Status  New      OT LONG TERM GOAL #2   Title  Pt will improve coordination for ADLs as shown by improving time on 9-hole peg test by at least 4sec with R hand.    Baseline  39.47sec    Time  8    Period  Weeks    Status  New      OT LONG TERM GOAL #3   Title  Pt will improve coordination/functional reaching for ADLs as shown by improving score on box and blocks with RUE by at least 4 blocks    Baseline  RUE 38 blocks    Time  8    Period  Weeks    Status  New      OT LONG  TERM GOAL #4   Title  Pt will improve R grip strength by at least 4lbs to assist in opening containers.    Baseline  33lbs    Time  8    Period  Weeks    Status  New      OT LONG TERM GOAL #5   Title  Pt will demo at least 140* R shoulder flex with -10* elbow ext for functional reaching for ADLs.    Baseline  130* with -15* elbow  ext    Time  8    Period  Weeks    Status  New            Plan - 03/08/18 1117    Clinical Impression Statement  Pt is progressing towards goals. She demonstrates understanding of inital HEP.    Occupational Profile and client history currently impacting functional performance  Pt is mod I for ADLs/IADLs, but reports that she is using L nondominant UE more for ADLs due to incr difficulty with RUE.      Occupational performance deficits (Please refer to evaluation for details):  ADL's;IADL's;Leisure;Social Participation    Rehab Potential  Good    OT Frequency  2x / week    OT Duration  8 weeks    OT Treatment/Interventions  Aquatic Therapy;Paraffin;Neuromuscular education;Splinting;Patient/family education;Therapeutic activities;Functional Mobility Training;Energy conservation;Fluidtherapy;Cryotherapy;Ultrasound;DME and/or AE instruction;Manual Therapy;Passive range of motion;Cognitive remediation/compensation;Therapeutic exercise;Self-care/ADL training;Moist Heat    Plan  check on HEP, info regarding DBS    Consulted and Agree with Plan of Care  Patient       Patient will benefit from skilled therapeutic intervention in order to improve the following deficits and impairments:  Decreased cognition, Decreased mobility, Decreased coordination, Decreased activity tolerance, Decreased range of motion, Decreased strength, Impaired tone, Improper spinal/pelvic alignment, Impaired UE functional use, Impaired perceived functional ability, Decreased balance  Visit Diagnosis: Other symptoms and signs involving the nervous system  Other symptoms and signs involving the musculoskeletal system  Muscle weakness (generalized)  Other lack of coordination    Problem List Patient Active Problem List   Diagnosis Date Noted  . 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 Chronic nonallergic rhinitis 12/29/2016  . Mild persistent asthma, uncomplicated 19/62/2297  . Vocal fold paralysis, bilateral  12/29/2016  . Gastroesophageal reflux disease 12/29/2016  . Cervical spondylosis with myelopathy and radiculopathy 11/14/2013  . Essential tremor 06/13/2013  . Cervical spondylosis without myelopathy 06/13/2013  . Breast cancer of lower-outer quadrant of left female breast ([place]) 09/10/2010    Yuko Coventry 03/08/2018, 11:19 AM  [facility] [address] [place], [place], [address] Phone: [phone]   Fax:  [phone]  Name: April Church MRN: [id number] Date of Birth: 19-Feb-1941

## 2018-03-09 NOTE — Therapy (Signed)
[facility] [address] [place], [place], [address] Phone: [phone]   Fax:  [phone]  Physical Therapy Evaluation  Patient Details  Name: April Church MRN: [id number] Date of Birth: September 28, 1940 Referring Provider (PT): Penumalli   Encounter Date: 03/08/2018  PT End of Session - 03/09/18 1205    Visit Number  1    Number of Visits  13    Date for PT Re-Evaluation  06/07/18    Authorization Type  Medicare, BCBS supplement-10th visit progress note    PT Start Time  1106    PT Stop Time  1150    PT Time Calculation (min)  44 min    Activity Tolerance  Patient tolerated treatment well    Behavior During Therapy  [facility] for tasks assessed/performed       Past Medical History:  Diagnosis Date  . Asthma    daily inhaler  . Breast cancer ([place])    left- radiation and surgery -dx. 2011- no further tx. now- [name] , [name]  . Cyst of finger 11/2011   annular cyst right long finger  . Dental crowns present   . Dermatitis   . Frequency of urination   . GERD (gastroesophageal reflux disease)   . Hemorrhoid   . Hyperlipidemia   . Hypertension    under control, has been on med. x 2 yrs.  [name] PONV (postoperative nausea and vomiting)   . Seasonal allergies   . Tremors of nervous system    hands  . Trigger finger of right hand 11/2011   long finger    Past Surgical History:  Procedure Laterality Date  . ANTERIOR CERVICAL DECOMPRESSION/DISCECTOMY FUSION 4 LEVELS N/A 11/14/2013   Procedure: ANTERIOR CERVICAL DECOMPRESSION/DISCECTOMY FUSION 4 LEVELS;  Surgeon: Newman Pies, MD;  Location: [facility];  Service: Neurosurgery;  Laterality: N/A;  C34 C45 C56 C67 anterior cervical fusion with interbody prosthesis plating and  bonegraft  . APPENDECTOMY  age 84  . BREAST LUMPECTOMY  06/16/2009   left; SLN bx.  [name] BREAST LUMPECTOMY  07/01/2009   re-excision  . BREAST SURGERY  1999   reduction  . CATARACT  EXTRACTION, BILATERAL    . COLONOSCOPY WITH PROPOFOL N/A 12/03/2014   Procedure: COLONOSCOPY WITH PROPOFOL;  Surgeon: [name], MD;  Location: [facility];  Service: Endoscopy;  Laterality: N/A;  . FOOT SURGERY  06/22/11   left  . KNEE ARTHROSCOPY  03/12/2005   right  . KNEE ARTHROSCOPY     left  . NASAL SINUS SURGERY     x 2  . 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 TRIGGER FINGER RELEASE  12/16/2011   Procedure: RELEASE TRIGGER FINGER/A-1 PULLEY;  Surgeon: Tennis Must, MD;  Location: [facility];  Service: Orthopedics;  Laterality: Right;  RIGHT LONG FINGER TRIGGER RELEASE & ANNULAR CYST EXCISION  . TUMOR EXCISION  age 63   right arm    There were no vitals filed for this visit.   Subjective Assessment - 03/08/18 1110    Subjective  Has had trouble with her right knee-had injection and has gotten better since then.  Feels like she is getting up slower, back bothers her first thing in the morning, better as the day goes on.  Feels that balance is not as good, but no falls.    Pertinent History  Parkinson's disease    Patient Stated Goals  Pt's goal for  therapy is to be able to get back to moving better, moving more.    Currently in Pain?  No/denies         [facility] PT Assessment - 03/08/18 1116      Assessment   Medical Diagnosis  Parkinson's disease    Referring Provider (PT)  Penumalli    Onset Date/Surgical Date  10/20/17   screen   Hand Dominance  Right      Precautions   Precautions  Fall      Balance Screen   Has the patient fallen in the past 6 months  No    Has the patient had a decrease in activity level because of a fear of falling?   No   More cautious   Is the patient reluctant to leave their home because of a fear of falling?   No      Home Film/video editor residence    Living Arrangements  Spouse/significant other    Available Help at Discharge  Family    Type of [place] to enter    Entrance Stairs-Number of Steps  3    Entrance Stairs-Rails  Right;Left    Home Layout  Two level    Alternate Level Stairs-Number of Steps  166    Alternate Level Stairs-Rails  Left;Right      Prior Function   Level of Independence  Independent    Leisure  Is not doing too much exercise at this time      Observation/Other Assessments   Focus on Therapeutic Outcomes (FOTO)   NA      Tone   Assessment Location  Right Lower Extremity;Left Lower Extremity      ROM / Strength   AROM / PROM / Strength  Strength      Strength   Overall Strength  Deficits    Overall Strength Comments  Grossly tested 5/5 LLE, 4/5 RLE      Transfers   Transfers  Sit to Stand;Stand to Sit    Sit to Stand  6: Modified independent (Device/Increase time);Without upper extremity assist;From chair/3-in-1    Five time sit to stand comments   18.63    Stand to Sit  6: Modified independent (Device/Increase time);Without upper extremity assist;To chair/3-in-1      Ambulation/Gait   Ambulation/Gait  Yes    Ambulation/Gait Assistance  6: Modified independent (Device/Increase time)    Ambulation Distance (Feet)  200 Feet    Assistive device  None    Gait Pattern  Step-through pattern;Decreased arm swing - right;Decreased step length - right;Narrow base of support    Ambulation Surface  Level;Indoor    Gait velocity  3.05 ft/sec      Standardized Balance Assessment   Standardized Balance Assessment  Timed Up and Go Test      Timed Up and Go Test   Normal TUG (seconds)  11.4    Manual TUG (seconds)  14.1    Cognitive TUG (seconds)  14.79    TUG Comments  TUG scores >10% difference in TUG and TUG manual/cognitive indicate increased difficulty with dual tasking      High Level Balance   High Level Balance Comments  MiniBESTest score 15/28 (Scores <22/28 indicate increased fall risk)      RLE Tone   RLE Tone  Mild      LLE Tone   LLE Tone  Mild  Mini-BESTest: Balance Evaluation Systems Test  2005-2013 [place]. All rights reserved. ________________________________________________________________________________________Anticipatory_________Subscore___4__/6 1. SIT TO STAND Instruction: "Cross your arms across your chest. Try not to use your hands unless you must.Do not let your legs lean against the back of the chair when you stand. Please stand up now." X(2) Normal: Comes to stand without use of hands and stabilizes independently. (1) Moderate: Comes to stand WITH use of hands on first attempt. (0) Severe: Unable to stand up from chair without assistance, OR needs several attempts with use of hands. 2. RISE TO TOES Instruction: "Place your feet shoulder width apart. Place your hands on your hips. Try to rise as high as you can onto your toes. I will count out loud to 3 seconds. Try to hold this pose for at least 3 seconds. Look straight ahead. Rise now." (2) Normal: Stable for 3 s with maximum height. X(1) Moderate: Heels up, but not full range (smaller than when holding hands), OR noticeable instability for 3 s. (0) Severe: < 3 s. 3. STAND ON ONE LEG Instruction: "Look straight ahead. Keep your hands on your hips. Lift your leg off of the ground behind you without touching or resting your raised leg upon your other standing leg. Stay standing on one leg as long as you can. Look straight ahead. Lift now." Left: Time in Seconds Trial 1:__7.07___Trial 2:__4.75___ (2) Normal: 20 s. X(1) Moderate: < 20 s. (0) Severe: Unable. Right: Time in Seconds Trial 1:__4.15___Trial 2:__1.54___ (2) Normal: 20 s. X(1) Moderate: < 20 s. (0) Severe: Unable To score each side separately use the trial with the longest time. To calculate the sub-score and total score use the side [left or right] with the lowest numerical score [i.e. the worse  side]. ______________________________________________________________________________________Reactive Postural Control__Hip strategy only_________Subscore:___0__/6 4. COMPENSATORY STEPPING CORRECTION- FORWARD Instruction: "Stand with your feet shoulder width apart, arms at your sides. Lean forward against my hands beyond your forward limits. When I let go, do whatever is necessary, including taking a step, to avoid a fall." (2) Normal: Recovers independently with a single, large step (second realignment step is allowed). (1) Moderate: More than one step used to recover equilibrium. X(0) Severe: No step, OR would fall if not caught, OR falls spontaneously. 5. COMPENSATORY STEPPING CORRECTION- BACKWARD Instruction: "Stand with your feet shoulder width apart, arms at your sides. Lean backward against my hands beyond your backward limits. When I let go, do whatever is necessary, including taking a step, to avoid a fall." (2) Normal: Recovers independently with a single, large step. (1) Moderate: More than one step used to recover equilibrium. X(0) Severe: No step, OR would fall if not caught, OR falls spontaneously. 6. COMPENSATORY STEPPING CORRECTION- LATERAL Instruction: "Stand with your feet together, arms down at your sides. Lean into my hand beyond your sideways limit. When I let go, do whatever is necessary, including taking a step, to avoid a fall." Left (2) Normal: Recovers independently with 1 step (crossover or lateral OK). (1) Moderate: Several steps to recover equilibrium. X(0) Severe: Falls, or cannot step. Right (2) Normal: Recovers independently with 1 step (crossover or lateral OK). (1) Moderate: Several steps to recover equilibrium. X(0) Severe: Falls, or cannot step. Use the side with the lowest score to calculate sub-score and total score. ____________________________________________________________________________________Sensory  Orientation_____________Subscore:______4___/6 7. STANCE (FEET TOGETHER); EYES OPEN, FIRM SURFACE Instruction: "Place your hands on your hips. Place your feet together until almost touching. Look straight ahead. Be as stable and still as possible,  until I say stop." Time in seconds:________ X(2) Normal: 30 s. (1) Moderate: < 30 s. (0) Severe: Unable. 8. STANCE (FEET TOGETHER); EYES CLOSED, FOAM SURFACE Instruction: "Step onto the foam. Place your hands on your hips. Place your feet together until almost touching. Be as stable and still as possible, until I say stop. I will start timing when you close your eyes." Time in seconds:___15.72_____ (2) Normal: 30 s. X(1) Moderate: < 30 s. (0) Severe: Unable. 9. INCLINE- EYES CLOSED Instruction: "Step onto the incline ramp. Please stand on the incline ramp with your toes toward the top. Place your feet shoulder width apart and have your arms down at your sides. I will start timing when you close your eyes." Time in seconds:________ (2) Normal: Stands independently 30 s and aligns with gravity. X(1) Moderate: Stands independently <30 s OR aligns with surface. (0) Severe: Unable. _________________________________________________________________________________________Dynamic Gait ______Subscore_____7___/10 10. CHANGE IN GAIT SPEED Instruction: "Begin walking at your normal speed, when I tell you 'fast', walk as fast as you can. When I say 'slow', walk very slowly." X(2) Normal: Significantly changes walking speed without imbalance. (1) Moderate: Unable to change walking speed or signs of imbalance. (0) Severe: Unable to achieve significant change in walking speed AND signs of imbalance. [place] - HORIZONTAL Instruction: "Begin walking at your normal speed, when I say "right", turn your head and look to the right. When I say "left" turn your head and look to the left. Try to keep yourself walking in a straight line." (2) Normal:  performs head turns with no change in gait speed and good balance. X(1) Moderate: performs head turns with reduction in gait speed. (0) Severe: performs head turns with imbalance. 12. WALK WITH PIVOT TURNS Instruction: "Begin walking at your normal speed. When I tell you to 'turn and stop', turn as quickly as you can, face the opposite direction, and stop. After the turn, your feet should be close together." X(2) Normal: Turns with feet close FAST (< 3 steps) with good balance. (1) Moderate: Turns with feet close SLOW (>4 steps) with good balance. (0) Severe: Cannot turn with feet close at any speed without imbalance. 13. STEP OVER OBSTACLES Instruction: "Begin walking at your normal speed. When you get to the box, step over it, not around it and keep walking." (2) Normal: Able to step over box with minimal change of gait speed and with good balance. X(1) Moderate: Steps over box but touches box OR displays cautious behavior by slowing gait. (0) Severe: Unable to step over box OR steps around box. 14. TIMED UP & GO WITH DUAL TASK [3 METER WALK] Instruction TUG: "When I say 'Go', stand up from chair, walk at your normal speed across the tape on the floor, turn around, and come back to sit in the chair." Instruction TUG with Dual Task: "Count backwards by threes starting at ___. When I say 'Go', stand up from chair, walk at your normal speed across the tape on the floor, turn around, and come back to sit in the chair. Continue counting backwards the entire time." TUG: ________seconds; Dual Task TUG: ________seconds (2) Normal: No noticeable change in sitting, standing or walking while backward counting when compared to TUG without Dual Task. X(1) Moderate: Dual Task affects either counting OR walking (>10%) when compared to the TUG without Dual Task. (0) Severe: Stops counting while walking OR stops walking while counting. When scoring item 14, if subject's gait speed slows more than 10%  between the TUG without and with a Dual Task the score should be decreased by a point. TOTAL SCORE: ______15__/28        Objective measurements completed on examination: See above findings.                PT Short Term Goals - 03/09/18 1309      PT SHORT TERM GOAL #1   Title  Pt will be independent with HEP for improved balance, functional strength and gait.  TARGET 04/07/2018    Time  4    Period  Weeks    Status  New    Target Date  04/07/18      PT SHORT TERM GOAL #2   Title  Pt will improve 5x sit<>stand to less than or equal to 15 seconds for decreased fall risk.    Time  4    Period  Weeks    Status  New    Target Date  04/07/18      PT SHORT TERM GOAL #3   Title  Pt will improve TUG cognitive score to less than or equal to 10% difference of TUG score, for improved dual tasking with gait.    Time  4    Period  Weeks    Status  New    Target Date  04/07/18      PT SHORT TERM GOAL #4   Title  Pt will verbalize understanding of fall prevention in home environment.    Time  4    Period  Weeks    Target Date  04/07/18        PT Long Term Goals - 03/09/18 1312      PT LONG TERM GOAL #1   Title  Pt will be independent with progression of HEP for improved mobility, strength, balance.  04/11/2018    Time  6    Period  Weeks    Status  New    Target Date  04/21/18      PT LONG TERM GOAL #2   Title  Pt will improve MiniBESTest score to at least 20/28 for decreased fall risk.    Time  6    Period  Weeks    Status  New    Target Date  04/21/18      PT LONG TERM GOAL #3   Title  Pt will ambulate at least 1000 ft, independently on indoor and outdoor surfaces for improved outdoor and unlevel surfaces gait.    Time  6    Period  Weeks    Status  New    Target Date  04/21/18      PT LONG TERM GOAL #4   Title  Pt will verbalize plans for ongoing community fitness upon d/c from PT.    Time  6    Period  Weeks    Status  New    Target Date  04/21/18              Plan - 03/09/18 1206    Clinical Impression Statement  Pt is a 78 year old female who presents to OPPT as recommendation from follow-up PD screen in August 2018.  She has history of Parkinson's disease, and she was delayed in returning for eval due to knee pain requiring injection(knee pain is resolved).  Pt presents with decreased functional strength in lower extremities, decreased balance, difficulty with dual tasking, slowed gait velocity, bradykinesia, and rigidity, tremors.  Pt will benefit from skilled  PT to address balance, strength, gait for improved funcitonal mobility and decreased fall risk.    History and Personal Factors relevant to plan of care:  PMH > 3 co-morbidities, slowed mobility compared to previous screens    Clinical Presentation  Evolving    Clinical Presentation due to:  PD as neurodegenerative disease, fall risk per MIniBESTest    Clinical Decision Making  Moderate    Rehab Potential  Good    PT Frequency  2x / week    PT Duration  6 weeks   plus eval   PT Treatment/Interventions  ADLs/Self Care Home Management;Gait training;Stair training;Functional mobility training;Therapeutic activities;Therapeutic exercise;Balance training;Neuromuscular re-education;Patient/family education    PT Next Visit Plan  Pt has questions about DBS based on probable consult at [facility] for deep brain stimulation-education as able.  Initiate HEP for functional strenght and balance    Consulted and Agree with Plan of Care  Patient       Patient will benefit from skilled therapeutic intervention in order to improve the following deficits and impairments:  Abnormal gait, Decreased balance, Decreased coordination, Decreased mobility, Decreased strength, Difficulty walking  Visit Diagnosis: Other abnormalities of gait and mobility  Unsteadiness  Muscle weakness (generalized)  Other symptoms and signs involving the nervous system     Problem List Patient Active  Problem List   Diagnosis Date Noted  . 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 Chronic nonallergic rhinitis 12/29/2016  . Mild persistent asthma, uncomplicated 18/29/9371  . 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 Gastroesophageal reflux disease 12/29/2016  . Cervical spondylosis with myelopathy and radiculopathy 11/14/2013  . Essential tremor 06/13/2013  . Cervical spondylosis without myelopathy 06/13/2013  . Breast cancer of lower-outer quadrant of left female breast ([place]) 09/10/2010    Chrisie Jankovich W. 03/09/2018, 2:47 PM  Mady Haagensen, PT 03/09/18 2:52 PM Phone: [phone] Fax: [facility] [address] [place], [place], [address] Phone: [phone]   Fax:  [phone]  Name: April Church MRN: [id number] Date of Birth: [birth year]

## 2018-03-10 ENCOUNTER — Ambulatory Visit: Payer: Medicare Other | Admitting: Occupational Therapy

## 2018-03-10 ENCOUNTER — Ambulatory Visit: Payer: Medicare Other | Admitting: Physical Therapy

## 2018-03-10 DIAGNOSIS — R2681 Unsteadiness on feet: Secondary | ICD-10-CM

## 2018-03-10 DIAGNOSIS — R2689 Other abnormalities of gait and mobility: Secondary | ICD-10-CM

## 2018-03-10 DIAGNOSIS — M6281 Muscle weakness (generalized): Secondary | ICD-10-CM

## 2018-03-10 DIAGNOSIS — R29818 Other symptoms and signs involving the nervous system: Secondary | ICD-10-CM

## 2018-03-10 DIAGNOSIS — R29898 Other symptoms and signs involving the musculoskeletal system: Secondary | ICD-10-CM | POA: Diagnosis not present

## 2018-03-10 DIAGNOSIS — R278 Other lack of coordination: Secondary | ICD-10-CM

## 2018-03-10 NOTE — Therapy (Signed)
[facility] [address] [place], [place], [address] Phone: [phone]   Fax:  [phone]  Physical Therapy Treatment  Patient Details  Name: April Church MRN: [id number] Date of Birth: Feb 23, 1941 Referring Provider (PT): Penumalli   Encounter Date: 03/10/2018  PT End of Session - 03/10/18 1213    Visit Number  2    Number of Visits  13    Date for PT Re-Evaluation  06/07/18    Authorization Type  Medicare, BCBS supplement-10th visit progress note    PT Start Time  0843    PT Stop Time  0925    PT Time Calculation (min)  42 min    Activity Tolerance  Patient tolerated treatment well    Behavior During Therapy  [facility] for tasks assessed/performed       Past Medical History:  Diagnosis Date  . Asthma    daily inhaler  . Breast cancer ([place])    left- radiation and surgery -dx. 2011- no further tx. now- [name] , [name]  . Cyst of finger 11/2011   annular cyst right long finger  . Dental crowns present   . Dermatitis   . Frequency of urination   . GERD (gastroesophageal reflux disease)   . Hemorrhoid   . Hyperlipidemia   . Hypertension    under control, has been on med. x 2 yrs.  [name] PONV (postoperative nausea and vomiting)   . Seasonal allergies   . Tremors of nervous system    hands  . Trigger finger of right hand 11/2011   long finger    Past Surgical History:  Procedure Laterality Date  . ANTERIOR CERVICAL DECOMPRESSION/DISCECTOMY FUSION 4 LEVELS N/A 11/14/2013   Procedure: ANTERIOR CERVICAL DECOMPRESSION/DISCECTOMY FUSION 4 LEVELS;  Surgeon: Newman Pies, MD;  Location: [facility];  Service: Neurosurgery;  Laterality: N/A;  C34 C45 C56 C67 anterior cervical fusion with interbody prosthesis plating and  bonegraft  . APPENDECTOMY  age 60  . BREAST LUMPECTOMY  06/16/2009   left; SLN bx.  [name] BREAST LUMPECTOMY  07/01/2009   re-excision  . BREAST SURGERY  1999   reduction  . CATARACT  EXTRACTION, BILATERAL    . COLONOSCOPY WITH PROPOFOL N/A 12/03/2014   Procedure: COLONOSCOPY WITH PROPOFOL;  Surgeon: [name], MD;  Location: [facility];  Service: Endoscopy;  Laterality: N/A;  . FOOT SURGERY  06/22/11   left  . KNEE ARTHROSCOPY  03/12/2005   right  . KNEE ARTHROSCOPY     left  . NASAL SINUS SURGERY     x 2  . 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 TRIGGER FINGER RELEASE  12/16/2011   Procedure: RELEASE TRIGGER FINGER/A-1 PULLEY;  Surgeon: Tennis Must, MD;  Location: [facility];  Service: Orthopedics;  Laterality: Right;  RIGHT LONG FINGER TRIGGER RELEASE & ANNULAR CYST EXCISION  . TUMOR EXCISION  age 9   right arm    There were no vitals filed for this visit.  Subjective Assessment - 03/10/18 1211    Subjective  Pt relays no new complaints today, she relays she wants to work on strength and balance.     Pertinent History  Parkinson's disease    Patient Stated Goals  Pt's goal for therapy is to be able to get back to moving better, moving more.    Currently in Pain?  No/denies       Therex for strength and  endurance and Neuro re-ed for balance training performed below: quadriped UE raises, LE raises, thread the needle stretch X 10 each bilat Standing shoulder Rows and ext Red Tband 2X10 Mini squats with UE support 2X10 Sit to stand without UE support 2X10 Balance: At counter top up/back X 3 ea for retro walking, walking around cones, sidestepping, stepping over hurdles step to progressed to reciprocal step over.Tandem stance at counter without UE support 3x30 sec bilat   PT Education - 03/10/18 1212    Education Details  beginning HEP for strength, endurance and balance, will add power exercises later    Person(s) Educated  Patient    Methods  Explanation;Demonstration;Verbal cues;Handout    Comprehension  Verbalized understanding;Returned demonstration       PT Short Term Goals - 03/09/18 1309       PT SHORT TERM GOAL #1   Title  Pt will be independent with HEP for improved balance, functional strength and gait.  TARGET 04/07/2018    Time  4    Period  Weeks    Status  New    Target Date  04/07/18      PT SHORT TERM GOAL #2   Title  Pt will improve 5x sit<>stand to less than or equal to 15 seconds for decreased fall risk.    Time  4    Period  Weeks    Status  New    Target Date  04/07/18      PT SHORT TERM GOAL #3   Title  Pt will improve TUG cognitive score to less than or equal to 10% difference of TUG score, for improved dual tasking with gait.    Time  4    Period  Weeks    Status  New    Target Date  04/07/18      PT SHORT TERM GOAL #4   Title  Pt will verbalize understanding of fall prevention in home environment.    Time  4    Period  Weeks    Target Date  04/07/18        PT Long Term Goals - 03/09/18 1312      PT LONG TERM GOAL #1   Title  Pt will be independent with progression of HEP for improved mobility, strength, balance.  04/11/2018    Time  6    Period  Weeks    Status  New    Target Date  04/21/18      PT LONG TERM GOAL #2   Title  Pt will improve MiniBESTest score to at least 20/28 for decreased fall risk.    Time  6    Period  Weeks    Status  New    Target Date  04/21/18      PT LONG TERM GOAL #3   Title  Pt will ambulate at least 1000 ft, independently on indoor and outdoor surfaces for improved outdoor and unlevel surfaces gait.    Time  6    Period  Weeks    Status  New    Target Date  04/21/18      PT LONG TERM GOAL #4   Title  Pt will verbalize plans for ongoing community fitness upon d/c from PT.    Time  6    Period  Weeks    Status  New    Target Date  04/21/18            Plan - 03/10/18 [id number]  Clinical Impression Statement  Session focused on initiation of HEP for functional strength, endurance, and balance. She was able to show good understanding and return demonstration of HEP and was overall supervision for  exercises so she was instructed to perform at counter top so she has UE support when needed for balance and this keeps her safe from falling. Quadriped exercises also perfomed today for core stabilization and thoracic mobility. PT will continue to progress toward functional goals as able.     Rehab Potential  Good    PT Frequency  2x / week    PT Duration  6 weeks    PT Treatment/Interventions  ADLs/Self Care Home Management;Gait training;Stair training;Functional mobility training;Therapeutic activities;Therapeutic exercise;Balance training;Neuromuscular re-education;Patient/family education    PT Next Visit Plan  Pt has questions about DBS based on probable consult at [facility] for deep brain stimulation-education as able. add power exercises for PD    PT Home Exercise Plan  Access Code: [email or web address]    Consulted and Agree with Plan of Care  Patient       Patient will benefit from skilled therapeutic intervention in order to improve the following deficits and impairments:  Abnormal gait, Decreased balance, Decreased coordination, Decreased mobility, Decreased strength, Difficulty walking  Visit Diagnosis: Other abnormalities of gait and mobility  Unsteadiness  Muscle weakness (generalized)  Other symptoms and signs involving the nervous system     Problem List Patient Active Problem List   Diagnosis Date Noted  . 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 Chronic nonallergic rhinitis 12/29/2016  . Mild persistent asthma, uncomplicated 93/23/5573  . 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 Gastroesophageal reflux disease 12/29/2016  . Cervical spondylosis with myelopathy and radiculopathy 11/14/2013  . Essential tremor 06/13/2013  . Cervical spondylosis without myelopathy 06/13/2013  . Breast cancer of lower-outer quadrant of left female breast [facility]) 09/10/2010    Silvestre Mesi 03/10/2018, 12:17 PM  [facility] [address] [place], [place], [address] Phone: [phone]   Fax:  [phone]  Name: April Church MRN: [id number] Date of Birth: [birth year]

## 2018-03-10 NOTE — Therapy (Signed)
[facility] [address] [place], [place], [address] Phone: [phone]   Fax:  [phone]  Occupational Therapy Treatment  Patient Details  Name: April Church MRN: [id number] Date of Birth: 01-26-1941 Referring Provider (OT): Dr. Andrey Spearman   Encounter Date: 03/10/2018  OT End of Session - 03/10/18 1721    Visit Number  3    Number of Visits  17    Date for OT Re-Evaluation  05/28/18    Authorization Type  Medicare & BCBS, covered 100%     Authorization - Visit Number  3    OT Start Time  0805    OT Stop Time  0845    OT Time Calculation (min)  40 min       Past Medical History:  Diagnosis Date  . Asthma    daily inhaler  . Breast cancer ([place])    left- radiation and surgery -dx. 2011- no further tx. now- [name] , [name]  . Cyst of finger 11/2011   annular cyst right long finger  . Dental crowns present   . Dermatitis   . Frequency of urination   . GERD (gastroesophageal reflux disease)   . Hemorrhoid   . Hyperlipidemia   . Hypertension    under control, has been on med. x 2 yrs.  [name] PONV (postoperative nausea and vomiting)   . Seasonal allergies   . Tremors of nervous system    hands  . Trigger finger of right hand 11/2011   long finger    Past Surgical History:  Procedure Laterality Date  . ANTERIOR CERVICAL DECOMPRESSION/DISCECTOMY FUSION 4 LEVELS N/A 11/14/2013   Procedure: ANTERIOR CERVICAL DECOMPRESSION/DISCECTOMY FUSION 4 LEVELS;  Surgeon: Newman Pies, MD;  Location: [facility];  Service: Neurosurgery;  Laterality: N/A;  C34 C45 C56 C67 anterior cervical fusion with interbody prosthesis plating and  bonegraft  . APPENDECTOMY  age 78  . BREAST LUMPECTOMY  06/16/2009   left; SLN bx.  [name] BREAST LUMPECTOMY  07/01/2009   re-excision  . BREAST SURGERY  1999   reduction  . CATARACT EXTRACTION, BILATERAL    . COLONOSCOPY WITH PROPOFOL N/A 12/03/2014   Procedure:  COLONOSCOPY WITH PROPOFOL;  Surgeon: [name], MD;  Location: [facility];  Service: Endoscopy;  Laterality: N/A;  . FOOT SURGERY  06/22/11   left  . KNEE ARTHROSCOPY  03/12/2005   right  . KNEE ARTHROSCOPY     left  . NASAL SINUS SURGERY     x 2  . 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 TRIGGER FINGER RELEASE  12/16/2011   Procedure: RELEASE TRIGGER FINGER/A-1 PULLEY;  Surgeon: Tennis Must, MD;  Location: [facility];  Service: Orthopedics;  Laterality: Right;  RIGHT LONG FINGER TRIGGER RELEASE & ANNULAR CYST EXCISION  . TUMOR EXCISION  age 78   right arm    There were no vitals filed for this visit.  Subjective Assessment - 03/10/18 1722    Pertinent History  Parkinson's disease; hx of Kidney donor, hx of L breast CA, ET, cervical spondylosis and cervical decompression/fusion surgery, hx of R 3rd digit trigger finger release  and cyst, HTN, hyperlipidemia, hx of RUE tumor excision     Patient Stated Goals  improve ability to pick up coins/pils with R hand, writing, use of utensils    Currently in Pain?  No/denies  Treatment: PWR! hands basic 4 10 reps each. Followed by review of stacking and manipulating pennies, min v.c Following by handwriting strategies, and handwriting exercises, min v,c with improved techniques following practice. Pt practiced fastening buttons, with improved performance, using PWR! Hands. Reviewed putty exercises for grip and pinch, min v.c            OT Short Term Goals - 02/28/18 1152      OT SHORT TERM GOAL #1   Title  Pt will be independent with updated HEP--check STGs  03/29/18.    Time  4    Period  Weeks    Status  New      OT SHORT TERM GOAL #2   Title  Pt will be able to write short paragraph with 100% legibility using strategies/AE prn.    Baseline  -    Time  4    Period  Days    Status  New      OT SHORT TERM GOAL #3   Title  Pt will demo  incr ease with use of utensils as shown by improving PPT#2 to less than 19sec.    Baseline  -    Period  Weeks    Status  New      OT SHORT TERM GOAL #4   Title  Pt will be able to fasten/unfasten 3 buttons in less than 30sec.    Baseline  -    Time  4    Period  Weeks    Status  New      OT SHORT TERM GOAL #5   Title  Pt will be able to pick up small objects using tip pinch at least 75% of the time with R hand.    Baseline  uses lateral pinch    Time  4    Period  Weeks    Status  New        OT Long Term Goals - 02/28/18 1156      OT LONG TERM GOAL #1   Title  Pt will verbalize understanding of strategies/AE to incr ease with ADLs (including writing, buttoning/dressing, picking up objects).--check LTGs 04/27/18.    Time  8    Period  Weeks    Status  New      OT LONG TERM GOAL #2   Title  Pt will improve coordination for ADLs as shown by improving time on 9-hole peg test by at least 4sec with R hand.    Baseline  39.47sec    Time  8    Period  Weeks    Status  New      OT LONG TERM GOAL #3   Title  Pt will improve coordination/functional reaching for ADLs as shown by improving score on box and blocks with RUE by at least 4 blocks    Baseline  RUE 38 blocks    Time  8    Period  Weeks    Status  New      OT LONG TERM GOAL #4   Title  Pt will improve R grip strength by at least 4lbs to assist in opening containers.    Baseline  33lbs    Time  8    Period  Weeks    Status  New      OT LONG TERM GOAL #5   Title  Pt will demo at least 140* R shoulder flex with -10* elbow ext for functional reaching for ADLs.  Baseline  130* with -15* elbow ext    Time  8    Period  Weeks    Status  New            Plan - 03/10/18 1721    Clinical Impression Statement  Pt is progressing towards goals. She demonstrates improving handwrting with pactice following instructions.    Occupational Profile and client history currently impacting functional performance  Pt is mod I  for ADLs/IADLs, but reports that she is using L nondominant UE more for ADLs due to incr difficulty with RUE.      Occupational performance deficits (Please refer to evaluation for details):  ADL's;IADL's;Leisure;Social Participation    Rehab Potential  Good    OT Frequency  2x / week    OT Duration  8 weeks    Plan  check on HEP, info regarding DBS    Consulted and Agree with Plan of Care  Patient       Patient will benefit from skilled therapeutic intervention in order to improve the following deficits and impairments:  Decreased cognition, Decreased mobility, Decreased coordination, Decreased activity tolerance, Decreased range of motion, Decreased strength, Impaired tone, Improper spinal/pelvic alignment, Impaired UE functional use, Impaired perceived functional ability, Decreased balance  Visit Diagnosis: Other symptoms and signs involving the nervous system  Other symptoms and signs involving the musculoskeletal system  Other lack of coordination  Unsteadiness  Muscle weakness (generalized)    Problem List Patient Active Problem List   Diagnosis Date Noted  . 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 Chronic nonallergic rhinitis 12/29/2016  . Mild persistent asthma, uncomplicated 12/23/5943  . 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 Gastroesophageal reflux disease 12/29/2016  . Cervical spondylosis with myelopathy and radiculopathy 11/14/2013  . Essential tremor 06/13/2013  . Cervical spondylosis without myelopathy 06/13/2013  . Breast cancer of lower-outer quadrant of left female breast ([place]) 09/10/2010    Adrieanna Boteler 03/10/2018, 5:23 PM  [facility] [address] [place], [place], [address] Phone: [phone]   Fax:  [phone]  Name: TIMERA WINDT MRN: [id number] Date of Birth: [birth year]

## 2018-03-10 NOTE — Patient Instructions (Signed)
Access Code: [email or web address]  URL: [email or web address]  Date: 03/10/2018  Prepared by: Elsie Ra   Exercises  Mini Squat with Counter Support - 10 reps - 3 sets - 2x daily - 6x weekly  Backward Walking with Counter Support - 3-5 reps - 1 sets - 2x daily - 6x weekly  Walking Around Cones with Large Steps - 10 reps - 3 sets - 2x daily - 6x weekly  Side Stepping with Counter Support - 3-5 reps - 1 sets - 2x daily - 6x weekly  Standing Tandem Balance with Counter Support - 3 reps - 1 sets - 30 hold - 2x daily - 6x weekly  Standing Single Leg Stance with Counter Support - 10 reps - 3 sets - 2x daily - 6x weekly  Sit to Stand without Arm Support - 10 reps - 1-2 sets - 2x daily - 6x weekly

## 2018-03-13 ENCOUNTER — Ambulatory Visit: Payer: Medicare Other | Admitting: Occupational Therapy

## 2018-03-13 ENCOUNTER — Ambulatory Visit: Payer: Medicare Other | Admitting: Physical Therapy

## 2018-03-13 DIAGNOSIS — M6281 Muscle weakness (generalized): Secondary | ICD-10-CM

## 2018-03-13 DIAGNOSIS — R29898 Other symptoms and signs involving the musculoskeletal system: Secondary | ICD-10-CM

## 2018-03-13 DIAGNOSIS — R2681 Unsteadiness on feet: Secondary | ICD-10-CM | POA: Diagnosis not present

## 2018-03-13 DIAGNOSIS — R278 Other lack of coordination: Secondary | ICD-10-CM | POA: Diagnosis not present

## 2018-03-13 DIAGNOSIS — R29818 Other symptoms and signs involving the nervous system: Secondary | ICD-10-CM | POA: Diagnosis not present

## 2018-03-13 DIAGNOSIS — R293 Abnormal posture: Secondary | ICD-10-CM

## 2018-03-13 DIAGNOSIS — R2689 Other abnormalities of gait and mobility: Secondary | ICD-10-CM

## 2018-03-13 NOTE — Therapy (Signed)
[facility] [address] [place], [place], [address] Phone: [phone]   Fax:  [phone]  Occupational Therapy Treatment  Patient Details  Name: April Church MRN: [id number] Date of Birth: [birth year] Referring Provider (OT): Dr. Andrey Spearman   Encounter Date: 03/13/2018  OT End of Session - 03/13/18 1406    Visit Number  4    Number of Visits  17    Date for OT Re-Evaluation  05/28/18    Authorization Type  Medicare & BCBS, covered 100%     Authorization - Visit Number  4    Authorization - Number of Visits  10    OT Start Time  1404    OT Stop Time  1445    OT Time Calculation (min)  41 min    Activity Tolerance  Patient tolerated treatment well    Behavior During Therapy  [facility] for tasks assessed/performed       Past Medical History:  Diagnosis Date  . Asthma    daily inhaler  . Breast cancer ([place])    left- radiation and surgery -dx. 2011- no further tx. now- [name] , [name]  . Cyst of finger 11/2011   annular cyst right long finger  . Dental crowns present   . Dermatitis   . Frequency of urination   . GERD (gastroesophageal reflux disease)   . Hemorrhoid   . Hyperlipidemia   . Hypertension    under control, has been on med. x 2 yrs.  [name] PONV (postoperative nausea and vomiting)   . Seasonal allergies   . Tremors of nervous system    hands  . Trigger finger of right hand 11/2011   long finger    Past Surgical History:  Procedure Laterality Date  . ANTERIOR CERVICAL DECOMPRESSION/DISCECTOMY FUSION 4 LEVELS N/A 11/14/2013   Procedure: ANTERIOR CERVICAL DECOMPRESSION/DISCECTOMY FUSION 4 LEVELS;  Surgeon: Newman Pies, MD;  Location: [facility];  Service: Neurosurgery;  Laterality: N/A;  C34 C45 C56 C67 anterior cervical fusion with interbody prosthesis plating and  bonegraft  . APPENDECTOMY  age 63  . BREAST LUMPECTOMY  06/16/2009   left; SLN bx.  [name] BREAST LUMPECTOMY  07/01/2009    re-excision  . BREAST SURGERY  1999   reduction  . CATARACT EXTRACTION, BILATERAL    . COLONOSCOPY WITH PROPOFOL N/A 12/03/2014   Procedure: COLONOSCOPY WITH PROPOFOL;  Surgeon: [name], MD;  Location: [facility];  Service: Endoscopy;  Laterality: N/A;  . FOOT SURGERY  06/22/11   left  . KNEE ARTHROSCOPY  03/12/2005   right  . KNEE ARTHROSCOPY     left  . NASAL SINUS SURGERY     x 2  . 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 TRIGGER FINGER RELEASE  12/16/2011   Procedure: RELEASE TRIGGER FINGER/A-1 PULLEY;  Surgeon: Tennis Must, MD;  Location: [facility];  Service: Orthopedics;  Laterality: Right;  RIGHT LONG FINGER TRIGGER RELEASE & ANNULAR CYST EXCISION  . TUMOR EXCISION  age 61   right arm    There were no vitals filed for this visit.  Subjective Assessment - 03/13/18 1701    Subjective   Pt reports she sees MD on Wed about DBS, Amy reviewed info with pt regarding DBS    Pertinent History  Parkinson's disease; hx of Kidney donor, hx of L breast CA, ET, cervical spondylosis and cervical decompression/fusion surgery, hx of R 3rd  digit trigger finger release  and cyst, HTN, hyperlipidemia, hx of RUE tumor excision     Patient Stated Goals  improve ability to pick up coins/pils with R hand, writing, use of utensils    Currently in Pain?  No/denies                 Treatment: Dynamic step and reach to copy small peg design, mod v.c for larger amplitude movements, mod-max difficulty with RUE functional use, min difficulty with LUE. Seated handwriting activities using felt tip pen, min v.c for handwriting strategy, improved legibility and size with arm propped on table. Dual tasking, Ambulating while tossing a ball between her hands, and carrying on a conversation, min difficulty/ v.c Pt tripped over a curb and fell on her way in today, she cut her head but reports she is overall fine. Pt was educated by PT for any  signs and symptoms of head injury that would require her to call MD.             OT Short Term Goals - 02/28/18 1152      OT SHORT TERM GOAL #1   Title  Pt will be independent with updated HEP--check STGs  03/29/18.    Time  4    Period  Weeks    Status  New      OT SHORT TERM GOAL #2   Title  Pt will be able to write short paragraph with 100% legibility using strategies/AE prn.    Baseline  -    Time  4    Period  Days    Status  New      OT SHORT TERM GOAL #3   Title  Pt will demo incr ease with use of utensils as shown by improving PPT#2 to less than 19sec.    Baseline  -    Period  Weeks    Status  New      OT SHORT TERM GOAL #4   Title  Pt will be able to fasten/unfasten 3 buttons in less than 30sec.    Baseline  -    Time  4    Period  Weeks    Status  New      OT SHORT TERM GOAL #5   Title  Pt will be able to pick up small objects using tip pinch at least 75% of the time with R hand.    Baseline  uses lateral pinch    Time  4    Period  Weeks    Status  New        OT Long Term Goals - 02/28/18 1156      OT LONG TERM GOAL #1   Title  Pt will verbalize understanding of strategies/AE to incr ease with ADLs (including writing, buttoning/dressing, picking up objects).--check LTGs 04/27/18.    Time  8    Period  Weeks    Status  New      OT LONG TERM GOAL #2   Title  Pt will improve coordination for ADLs as shown by improving time on 9-hole peg test by at least 4sec with R hand.    Baseline  39.47sec    Time  8    Period  Weeks    Status  New      OT LONG TERM GOAL #3   Title  Pt will improve coordination/functional reaching for ADLs as shown by improving score on box and blocks with RUE by at  least 4 blocks    Baseline  RUE 38 blocks    Time  8    Period  Weeks    Status  New      OT LONG TERM GOAL #4   Title  Pt will improve R grip strength by at least 4lbs to assist in opening containers.    Baseline  33lbs    Time  8    Period  Weeks     Status  New      OT LONG TERM GOAL #5   Title  Pt will demo at least 140* R shoulder flex with -10* elbow ext for functional reaching for ADLs.    Baseline  130* with -15* elbow ext    Time  8    Period  Weeks    Status  New            Plan - 03/13/18 1409    Clinical Impression Statement  Pt is progressing towards goals. She demonstrates improving handwriting using felt tip pen and min v.c    Occupational Profile and client history currently impacting functional performance  Pt is mod I for ADLs/IADLs, but reports that she is using L nondominant UE more for ADLs due to incr difficulty with RUE.      Occupational performance deficits (Please refer to evaluation for details):  ADL's;IADL's;Leisure;Social Participation    Rehab Potential  Good    OT Frequency  2x / week    OT Duration  8 weeks    OT Treatment/Interventions  Aquatic Therapy;Paraffin;Neuromuscular education;Splinting;Patient/family education;Therapeutic activities;Functional Mobility Training;Energy conservation;Fluidtherapy;Cryotherapy;Ultrasound;DME and/or AE instruction;Manual Therapy;Passive range of motion;Cognitive remediation/compensation;Therapeutic exercise;Self-care/ADL training;Moist Heat    Plan  continue to work towards short term goals, ADL strategies.    Consulted and Agree with Plan of Care  Patient       Patient will benefit from skilled therapeutic intervention in order to improve the following deficits and impairments:  Decreased cognition, Decreased mobility, Decreased coordination, Decreased activity tolerance, Decreased range of motion, Decreased strength, Impaired tone, Improper spinal/pelvic alignment, Impaired UE functional use, Impaired perceived functional ability, Decreased balance  Visit Diagnosis: No diagnosis found.    Problem List Patient Active Problem List   Diagnosis Date Noted  . 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 Chronic nonallergic rhinitis 12/29/2016  . Mild persistent  asthma, uncomplicated 69/45/0388  . 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 Gastroesophageal reflux disease 12/29/2016  . Cervical spondylosis with myelopathy and radiculopathy 11/14/2013  . Essential tremor 06/13/2013  . Cervical spondylosis without myelopathy 06/13/2013  . Breast cancer of lower-outer quadrant of left female breast ([place]) 09/10/2010    Yohance Hathorne 03/13/2018, 5:02 PM  [facility] [address] [place], [place], [address] Phone: [phone]   Fax:  [phone]  Name: April Church MRN: [id number] Date of Birth: 02/06/1941

## 2018-03-13 NOTE — Patient Instructions (Signed)

## 2018-03-13 NOTE — Therapy (Signed)
[facility] [address] [place], [place], [address] Phone: [phone]   Fax:  [phone]  Physical Therapy Treatment  Patient Details  Name: April Church MRN: [id number] Date of Birth: [birth year] Referring Provider (PT): Penumalli   Encounter Date: 03/13/2018  PT End of Session - 03/13/18 2045    Visit Number  3    Number of Visits  13    Date for PT Re-Evaluation  06/07/18    Authorization Type  Medicare, BCBS supplement-10th visit progress note    PT Start Time  1234    PT Stop Time  1324    PT Time Calculation (min)  50 min    Activity Tolerance  Patient tolerated treatment well    Behavior During Therapy  [facility] for tasks assessed/performed       Past Medical History:  Diagnosis Date  . Asthma    daily inhaler  . Breast cancer ([place])    left- radiation and surgery -dx. 2011- no further tx. now- [name] , [name]  . Cyst of finger 11/2011   annular cyst right long finger  . Dental crowns present   . Dermatitis   . Frequency of urination   . GERD (gastroesophageal reflux disease)   . Hemorrhoid   . Hyperlipidemia   . Hypertension    under control, has been on med. x 2 yrs.  [name] PONV (postoperative nausea and vomiting)   . Seasonal allergies   . Tremors of nervous system    hands  . Trigger finger of right hand 11/2011   long finger    Past Surgical History:  Procedure Laterality Date  . ANTERIOR CERVICAL DECOMPRESSION/DISCECTOMY FUSION 4 LEVELS N/A 11/14/2013   Procedure: ANTERIOR CERVICAL DECOMPRESSION/DISCECTOMY FUSION 4 LEVELS;  Surgeon: Newman Pies, MD;  Location: [facility];  Service: Neurosurgery;  Laterality: N/A;  C34 C45 C56 C67 anterior cervical fusion with interbody prosthesis plating and  bonegraft  . APPENDECTOMY  age 54  . BREAST LUMPECTOMY  06/16/2009   left; SLN bx.  [name] BREAST LUMPECTOMY  07/01/2009   re-excision  . BREAST SURGERY  1999   reduction  . CATARACT  EXTRACTION, BILATERAL    . COLONOSCOPY WITH PROPOFOL N/A 12/03/2014   Procedure: COLONOSCOPY WITH PROPOFOL;  Surgeon: [name], MD;  Location: [facility];  Service: Endoscopy;  Laterality: N/A;  . FOOT SURGERY  06/22/11   left  . KNEE ARTHROSCOPY  03/12/2005   right  . KNEE ARTHROSCOPY     left  . NASAL SINUS SURGERY     x 2  . 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 TRIGGER FINGER RELEASE  12/16/2011   Procedure: RELEASE TRIGGER FINGER/A-1 PULLEY;  Surgeon: Tennis Must, MD;  Location: [facility];  Service: Orthopedics;  Laterality: Right;  RIGHT LONG FINGER TRIGGER RELEASE & ANNULAR CYST EXCISION  . TUMOR EXCISION  age 14   right arm    Vitals:   03/13/18 1240  BP: (!) 124/94    Subjective Assessment - 03/13/18 2035    Subjective  Pt reports catching foot stepping up the curb from parking lot coming into therapy, causing fall.  No pain, and pt reports she was able to get up on her own.    Pertinent History  Parkinson's disease    Patient Stated Goals  Pt's goal for therapy is to be able to get back to moving better, moving more.  Currently in Pain?  No/denies                       [facility] Adult PT Treatment/Exercise - 03/13/18 0001      Ambulation/Gait   Stairs  Yes    Stairs Assistance  6: Modified independent (Device/Increase time)    Stair Management Technique  Two rails;Alternating pattern;Forwards   Cues for full foot placement   Number of Stairs  4   x 2   Height of Stairs  6      Self-Care   Self-Care  Other Self-Care Comments    Other Self-Care Comments   Provided fall prevention education with patient, including fall prevention in home environment and given recent fall.  Also discussed pt's questions on deep brain stimulation from eval.  Provided information on DBS from [facility], including indications for DBS and how to advocate with asking questions at upcoming Movement  Disorders specialist visit on Wednesday.      Also provided education on signs/symtpoms of head injury, concussion.  Pt does not complain of any headache, dizziness, or balance changes following fall upon coming into therapy building.    Therapeutic Exercise: Reviewed HEP given last visit for strength, balance:  Mini Squat with Counter Support - 5 reps-cues for widened BOS  Backward Walking with Counter Support - 3 reps along counter, cues for widened BOS and step length  Side Stepping with Counter Support - 2 reps along counter   Standing Tandem Balance with Counter Support - 2 reps 10 seconds   Standing Single Leg Stance with Counter Support - 3 reps  Sit to Stand without Arm Support - 5 reps     PT Education - 03/13/18 2044    Education Details  Fall prevention education, information on deep brain stimulation    Person(s) Educated  Patient    Methods  Explanation;Handout    Comprehension  Verbalized understanding       PT Short Term Goals - 03/09/18 1309      PT SHORT TERM GOAL #1   Title  Pt will be independent with HEP for improved balance, functional strength and gait.  TARGET 04/07/2018    Time  4    Period  Weeks    Status  New    Target Date  04/07/18      PT SHORT TERM GOAL #2   Title  Pt will improve 5x sit<>stand to less than or equal to 15 seconds for decreased fall risk.    Time  4    Period  Weeks    Status  New    Target Date  04/07/18      PT SHORT TERM GOAL #3   Title  Pt will improve TUG cognitive score to less than or equal to 10% difference of TUG score, for improved dual tasking with gait.    Time  4    Period  Weeks    Status  New    Target Date  04/07/18      PT SHORT TERM GOAL #4   Title  Pt will verbalize understanding of fall prevention in home environment.    Time  4    Period  Weeks    Target Date  04/07/18        PT Long Term Goals - 03/09/18 1312      PT LONG TERM GOAL #1   Title  Pt will be independent with progression  of HEP for improved  mobility, strength, balance.  04/11/2018    Time  6    Period  Weeks    Status  New    Target Date  04/21/18      PT LONG TERM GOAL #2   Title  Pt will improve MiniBESTest score to at least 20/28 for decreased fall risk.    Time  6    Period  Weeks    Status  New    Target Date  04/21/18      PT LONG TERM GOAL #3   Title  Pt will ambulate at least 1000 ft, independently on indoor and outdoor surfaces for improved outdoor and unlevel surfaces gait.    Time  6    Period  Weeks    Status  New    Target Date  04/21/18      PT LONG TERM GOAL #4   Title  Pt will verbalize plans for ongoing community fitness upon d/c from PT.    Time  6    Period  Weeks    Status  New    Target Date  04/21/18            Plan - 03/13/18 2046    Clinical Impression Statement  Session today focused on education on fall prevention, deep brain stimulation (in preparation for movement disorders specialist visit for DBS referral from Dr. Leta Baptist).  Pt had fall earlier today from parking lot into therapy, with no reported pain/able to recover independently.  Reviewed HEP and safety with stair and curb negotiation.  Pt will continue to benefit from skilled PT to address balance, strength and gait.    Rehab Potential  Good    PT Frequency  2x / week    PT Duration  6 weeks    PT Treatment/Interventions  ADLs/Self Care Home Management;Gait training;Stair training;Functional mobility training;Therapeutic activities;Therapeutic exercise;Balance training;Neuromuscular re-education;Patient/family education    PT Next Visit Plan  Continue balance and functional strengthening exercises, PWR! Moves for postural strength, balance and to offset bradykinesia;     PT Home Exercise Plan  Access Code: [email or web address]    Consulted and Agree with Plan of Care  Patient       Patient will benefit from skilled therapeutic intervention in order to improve the following deficits and impairments:  Abnormal gait,  Decreased balance, Decreased coordination, Decreased mobility, Decreased strength, Difficulty walking  Visit Diagnosis: Unsteadiness  Other abnormalities of gait and mobility  Other symptoms and signs involving the nervous system     Problem List Patient Active Problem List   Diagnosis Date Noted  . 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 Chronic nonallergic rhinitis 12/29/2016  . Mild persistent asthma, uncomplicated 68/01/7516  . 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 Gastroesophageal reflux disease 12/29/2016  . Cervical spondylosis with myelopathy and radiculopathy 11/14/2013  . Essential tremor 06/13/2013  . Cervical spondylosis without myelopathy 06/13/2013  . Breast cancer of lower-outer quadrant of left female breast ([place]) 09/10/2010    Wylee Dorantes W. 03/13/2018, 8:57 PM  Frazier Butt., PT   [facility] [address] [place], [place], [address] Phone: [phone]   Fax:  [phone]  Name: April Church MRN: [id number] Date of Birth: November 09, 1940

## 2018-03-15 ENCOUNTER — Ambulatory Visit: Payer: Medicare Other | Admitting: Physical Therapy

## 2018-03-15 ENCOUNTER — Ambulatory Visit: Payer: Medicare Other | Admitting: Occupational Therapy

## 2018-03-15 DIAGNOSIS — Z79899 Other long term (current) drug therapy: Secondary | ICD-10-CM | POA: Diagnosis not present

## 2018-03-15 DIAGNOSIS — G250 Essential tremor: Secondary | ICD-10-CM | POA: Diagnosis not present

## 2018-03-15 DIAGNOSIS — G20 Parkinson's disease: Secondary | ICD-10-CM | POA: Diagnosis not present

## 2018-03-15 DIAGNOSIS — R531 Weakness: Secondary | ICD-10-CM | POA: Diagnosis not present

## 2018-03-21 ENCOUNTER — Encounter: Payer: Self-pay | Admitting: Physical Therapy

## 2018-03-21 ENCOUNTER — Ambulatory Visit: Payer: Medicare Other | Admitting: Occupational Therapy

## 2018-03-21 ENCOUNTER — Ambulatory Visit: Payer: Medicare Other | Admitting: Physical Therapy

## 2018-03-21 ENCOUNTER — Encounter: Payer: Self-pay | Admitting: Occupational Therapy

## 2018-03-21 DIAGNOSIS — R29818 Other symptoms and signs involving the nervous system: Secondary | ICD-10-CM | POA: Diagnosis not present

## 2018-03-21 DIAGNOSIS — R251 Tremor, unspecified: Secondary | ICD-10-CM

## 2018-03-21 DIAGNOSIS — R278 Other lack of coordination: Secondary | ICD-10-CM | POA: Diagnosis not present

## 2018-03-21 DIAGNOSIS — M6281 Muscle weakness (generalized): Secondary | ICD-10-CM | POA: Diagnosis not present

## 2018-03-21 DIAGNOSIS — R2681 Unsteadiness on feet: Secondary | ICD-10-CM

## 2018-03-21 DIAGNOSIS — R2689 Other abnormalities of gait and mobility: Secondary | ICD-10-CM

## 2018-03-21 DIAGNOSIS — R29898 Other symptoms and signs involving the musculoskeletal system: Secondary | ICD-10-CM

## 2018-03-21 DIAGNOSIS — R293 Abnormal posture: Secondary | ICD-10-CM

## 2018-03-21 NOTE — Therapy (Signed)
[facility] [address] [place], [place], [address] Phone: [phone]   Fax:  [phone]  Occupational Therapy Treatment  Patient Details  Name: April Church MRN: [id number] Date of Birth: [birth year] Referring Provider (OT): Dr. Andrey Spearman   Encounter Date: 03/21/2018  OT End of Session - 03/21/18 1532    Visit Number  5    Number of Visits  17    Date for OT Re-Evaluation  05/28/18    Authorization Type  Medicare & BCBS, covered 100%     Authorization - Visit Number  5    Authorization - Number of Visits  10    OT Start Time  1105    OT Stop Time  1145    OT Time Calculation (min)  40 min    Activity Tolerance  Patient tolerated treatment well    Behavior During Therapy  [facility] for tasks assessed/performed       Past Medical History:  Diagnosis Date  . Asthma    daily inhaler  . Breast cancer ([place])    left- radiation and surgery -dx. 2011- no further tx. now- [name] , [name]  . Cyst of finger 11/2011   annular cyst right long finger  . Dental crowns present   . Dermatitis   . Frequency of urination   . GERD (gastroesophageal reflux disease)   . Hemorrhoid   . Hyperlipidemia   . Hypertension    under control, has been on med. x 2 yrs.  [name] PONV (postoperative nausea and vomiting)   . Seasonal allergies   . Tremors of nervous system    hands  . Trigger finger of right hand 11/2011   long finger    Past Surgical History:  Procedure Laterality Date  . ANTERIOR CERVICAL DECOMPRESSION/DISCECTOMY FUSION 4 LEVELS N/A 11/14/2013   Procedure: ANTERIOR CERVICAL DECOMPRESSION/DISCECTOMY FUSION 4 LEVELS;  Surgeon: Newman Pies, MD;  Location: [facility];  Service: Neurosurgery;  Laterality: N/A;  C34 C45 C56 C67 anterior cervical fusion with interbody prosthesis plating and  bonegraft  . APPENDECTOMY  age 30  . BREAST LUMPECTOMY  06/16/2009   left; SLN bx.  [name] BREAST LUMPECTOMY  07/01/2009    re-excision  . BREAST SURGERY  1999   reduction  . CATARACT EXTRACTION, BILATERAL    . COLONOSCOPY WITH PROPOFOL N/A 12/03/2014   Procedure: COLONOSCOPY WITH PROPOFOL;  Surgeon: [name], MD;  Location: [facility];  Service: Endoscopy;  Laterality: N/A;  . FOOT SURGERY  06/22/11   left  . KNEE ARTHROSCOPY  03/12/2005   right  . KNEE ARTHROSCOPY     left  . NASAL SINUS SURGERY     x 2  . 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 TRIGGER FINGER RELEASE  12/16/2011   Procedure: RELEASE TRIGGER FINGER/A-1 PULLEY;  Surgeon: Tennis Must, MD;  Location: [facility];  Service: Orthopedics;  Laterality: Right;  RIGHT LONG FINGER TRIGGER RELEASE & ANNULAR CYST EXCISION  . TUMOR EXCISION  age 38   right arm    There were no vitals filed for this visit.  Subjective Assessment - 03/21/18 1105    Subjective   Pt reports that she has seen the Dr. Linus Mako for DBS consult.  Pt reports that DAT scan is scheduled for 04/06/18.    Pertinent History  Parkinson's disease; hx of Kidney donor, hx of L breast CA, ET, cervical spondylosis and cervical  decompression/fusion surgery, hx of R 3rd digit trigger finger release  and cyst, HTN, hyperlipidemia, hx of RUE tumor excision     Patient Stated Goals  improve ability to pick up coins/pils with R hand, writing, use of utensils    Currently in Pain?  No/denies        Writing with felt-tip pen with coban wrap.  Pt wrote 3-4 sentences with good size/legibility.  Recommended placing sheet of paper between checks to prevent bleed-through of ink.  Picking up checkers to put in container with tip pinch then picking up medium pegs and placing/removing from pegboard with tip pinch with min-mod cueing with RUE.  Removing/replacing clothespins with 1-2lb resistance with R hand using 3point pinch as able with min cues.  Review of putty ex for tip pinch with min cues (yellow putty).   attempted isolated MP  flex/intrinsic (+) but unable to perform accurately (red putty).           OT Education - 03/21/18 1135    Education Details  Discussed possible DBS surgery, upcoming DAT scan, and functional considerations for surgery per pt/MD discussion.  Pt instructed to work on cylinder grasp/opening hand with RUE to pick up bottles/cups and use of tip pinch to pick up small objects    Person(s) Educated  Patient    Methods  Explanation;Verbal cues    Comprehension  Verbalized understanding;Returned demonstration       OT Short Term Goals - 02/28/18 1152      OT SHORT TERM GOAL #1   Title  Pt will be independent with updated HEP--check STGs  03/29/18.    Time  4    Period  Weeks    Status  New      OT SHORT TERM GOAL #2   Title  Pt will be able to write short paragraph with 100% legibility using strategies/AE prn.    Baseline  -    Time  4    Period  Days    Status  New      OT SHORT TERM GOAL #3   Title  Pt will demo incr ease with use of utensils as shown by improving PPT#2 to less than 19sec.    Baseline  -    Period  Weeks    Status  New      OT SHORT TERM GOAL #4   Title  Pt will be able to fasten/unfasten 3 buttons in less than 30sec.    Baseline  -    Time  4    Period  Weeks    Status  New      OT SHORT TERM GOAL #5   Title  Pt will be able to pick up small objects using tip pinch at least 75% of the time with R hand.    Baseline  uses lateral pinch    Time  4    Period  Weeks    Status  New        OT Long Term Goals - 02/28/18 1156      OT LONG TERM GOAL #1   Title  Pt will verbalize understanding of strategies/AE to incr ease with ADLs (including writing, buttoning/dressing, picking up objects).--check LTGs 04/27/18.    Time  8    Period  Weeks    Status  New      OT LONG TERM GOAL #2   Title  Pt will improve coordination for ADLs as shown by improving time on 9-hole  peg test by at least 4sec with R hand.    Baseline  39.47sec    Time  8    Period  Weeks     Status  New      OT LONG TERM GOAL #3   Title  Pt will improve coordination/functional reaching for ADLs as shown by improving score on box and blocks with RUE by at least 4 blocks    Baseline  RUE 38 blocks    Time  8    Period  Weeks    Status  New      OT LONG TERM GOAL #4   Title  Pt will improve R grip strength by at least 4lbs to assist in opening containers.    Baseline  33lbs    Time  8    Period  Weeks    Status  New      OT LONG TERM GOAL #5   Title  Pt will demo at least 140* R shoulder flex with -10* elbow ext for functional reaching for ADLs.    Baseline  130* with -15* elbow ext    Time  8    Period  Weeks    Status  New            Plan - 03/21/18 1533    Clinical Impression Statement  Pt is progressing towards goals. She demonstrates improving handwriting using felt tip pen and min v.c    Occupational Profile and client history currently impacting functional performance  Pt is mod I for ADLs/IADLs, but reports that she is using L nondominant UE more for ADLs due to incr difficulty with RUE.      Occupational performance deficits (Please refer to evaluation for details):  ADL's;IADL's;Leisure;Social Participation    Rehab Potential  Good    OT Frequency  2x / week    OT Duration  8 weeks    OT Treatment/Interventions  Aquatic Therapy;Paraffin;Neuromuscular education;Splinting;Patient/family education;Therapeutic activities;Functional Mobility Training;Energy conservation;Fluidtherapy;Cryotherapy;Ultrasound;DME and/or AE instruction;Manual Therapy;Passive range of motion;Cognitive remediation/compensation;Therapeutic exercise;Self-care/ADL training;Moist Heat    Plan  continue to work towards short term goals, ADL strategies, tip pinch for picking up small objects    Consulted and Agree with Plan of Care  Patient       Patient will benefit from skilled therapeutic intervention in order to improve the following deficits and impairments:  Decreased cognition,  Decreased mobility, Decreased coordination, Decreased activity tolerance, Decreased range of motion, Decreased strength, Impaired tone, Improper spinal/pelvic alignment, Impaired UE functional use, Impaired perceived functional ability, Decreased balance  Visit Diagnosis: Other lack of coordination  Other symptoms and signs involving the musculoskeletal system  Tremor  Other symptoms and signs involving the nervous system  Unsteadiness  Abnormal posture  Muscle weakness (generalized)  Other abnormalities of gait and mobility    Problem List Patient Active Problem List   Diagnosis Date Noted  . 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 Chronic nonallergic rhinitis 12/29/2016  . Mild persistent asthma, uncomplicated 23/55/7322  . 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 Gastroesophageal reflux disease 12/29/2016  . Cervical spondylosis with myelopathy and radiculopathy 11/14/2013  . Essential tremor 06/13/2013  . Cervical spondylosis without myelopathy 06/13/2013  . Breast cancer of lower-outer quadrant of left female breast [facility]) 09/10/2010    [facility] 03/21/2018, 3:35 PM  [facility] [address] [place], [place], [address] Phone: [phone]   Fax:  [phone]  Name: April Church MRN: [id number] Date of Birth: [birth year]  Vianne Bulls, OTR/L [facility] [address] [place], [place]  [address] [phone] phone [phone] 03/21/18 3:36 PM

## 2018-03-21 NOTE — Therapy (Signed)
[facility] [address] [place], [place], [address] Phone: [phone]   Fax:  [phone]  Physical Therapy Treatment  Patient Details  Name: April Church MRN: [id number] Date of Birth: 05/26/1940 Referring Provider (PT): Pen[place]   Encounter Date: 03/21/2018  PT End of Session - 03/21/18 1312    Visit Number  4    Number of Visits  13    Date for PT Re-Evaluation  06/07/18    Authorization Type  Medicare, BCBS supplement-10th visit progress note    PT Start Time  1018    PT Stop Time  1058    PT Time Calculation (min)  40 min    Activity Tolerance  Patient tolerated treatment well    Behavior During Therapy  [facility] for tasks assessed/performed       Past Medical History:  Diagnosis Date  . Asthma    daily inhaler  . Breast cancer ([place])    left- radiation and surgery -dx. 2011- no further tx. now- [name] , [name]  . Cyst of finger 11/2011   annular cyst right long finger  . Dental crowns present   . Dermatitis   . Frequency of urination   . GERD (gastroesophageal reflux disease)   . Hemorrhoid   . Hyperlipidemia   . Hypertension    under control, has been on med. x 2 yrs.  [name] PONV (postoperative nausea and vomiting)   . Seasonal allergies   . Tremors of nervous system    hands  . Trigger finger of right hand 11/2011   long finger    Past Surgical History:  Procedure Laterality Date  . ANTERIOR CERVICAL DECOMPRESSION/DISCECTOMY FUSION 4 LEVELS N/A 11/14/2013   Procedure: ANTERIOR CERVICAL DECOMPRESSION/DISCECTOMY FUSION 4 LEVELS;  Surgeon: Newman Pies, MD;  Location: [facility];  Service: Neurosurgery;  Laterality: N/A;  C34 C45 C56 C67 anterior cervical fusion with interbody prosthesis plating and  bonegraft  . APPENDECTOMY  age 2  . BREAST LUMPECTOMY  06/16/2009   left; SLN bx.  [name] BREAST LUMPECTOMY  07/01/2009   re-excision  . BREAST SURGERY  1999   reduction  . CATARACT  EXTRACTION, BILATERAL    . COLONOSCOPY WITH PROPOFOL N/A 12/03/2014   Procedure: COLONOSCOPY WITH PROPOFOL;  Surgeon: [name], MD;  Location: [facility];  Service: Endoscopy;  Laterality: N/A;  . FOOT SURGERY  06/22/11   left  . KNEE ARTHROSCOPY  03/12/2005   right  . KNEE ARTHROSCOPY     left  . NASAL SINUS SURGERY     x 2  . 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 TRIGGER FINGER RELEASE  12/16/2011   Procedure: RELEASE TRIGGER FINGER/A-1 PULLEY;  Surgeon: Tennis Must, MD;  Location: [facility];  Service: Orthopedics;  Laterality: Right;  RIGHT LONG FINGER TRIGGER RELEASE & ANNULAR CYST EXCISION  . TUMOR EXCISION  age 60   right arm    There were no vitals filed for this visit.  Subjective Assessment - 03/21/18 1019    Subjective  Went for my evaluation at [facility] for the deep brain stimulation with Dr. Thomasenia Sales like I need the surgery for the right side.    Pertinent History  Parkinson's disease    Patient Stated Goals  Pt's goal for therapy is to be able to get back to moving better, moving more.    Currently in Pain?  No/denies  [facility] Adult PT Treatment/Exercise - 03/21/18 0001      Ambulation/Gait   Ambulation/Gait  Yes    Ambulation/Gait Assistance  6: Modified independent (Device/Increase time)    Ambulation Distance (Feet)  800 Feet    Assistive device  None    Gait Pattern  Step-through pattern;Decreased arm swing - right;Decreased step length - right;Narrow base of support    Ambulation Surface  Level;Indoor    Gait Comments  Cues for increased step length, improved arm swing with gait.  Dynamic gait and balance activities:  forward/back walking, sidestepping with coordinated arm movements; marching with coordinated UE motions.  Pt has difficulty with coordination of reciprocal arm motions with marching activities.      Self-Care   Self-Care  Other Self-Care  Comments    Other Self-Care Comments   Discussed follow-up for appt for DBS (deep brain stimulation).  Pt appears to be moving forward with DBS, with next step being DaT scan.  Discussed again, based on pt's discussion, DBS in relation to balance and speech.        [facility]) - 03/21/18 1027    PWR! exercises  Moves in standing    PWR! Up  x 10 reps    PWR! Rock  x 10 reps each side    PWR! Twist  x 10 reps each side    PWR Step  x 10 reps each side    Comments  Verbal and visual cues for technique and amplitude of movement.  Also performed forward step and weightshift x 10 reps, back step and weigthshift x 10 reps (with UE support), for initial stepping and balance recovery.    PWR! Sit to Stand  x 10 reps, cues for positioning and technique      Explained rationale for each PWR! Move and its relation to daily functional activities    PT Education - 03/21/18 1312    Education Details  F/U on DBS (deep brain stimulation) MD visit       PT Short Term Goals - 03/09/18 1309      PT SHORT TERM GOAL #1   Title  Pt will be independent with HEP for improved balance, functional strength and gait.  TARGET 04/07/2018    Time  4    Period  Weeks    Status  New    Target Date  04/07/18      PT SHORT TERM GOAL #2   Title  Pt will improve 5x sit<>stand to less than or equal to 15 seconds for decreased fall risk.    Time  4    Period  Weeks    Status  New    Target Date  04/07/18      PT SHORT TERM GOAL #3   Title  Pt will improve TUG cognitive score to less than or equal to 10% difference of TUG score, for improved dual tasking with gait.    Time  4    Period  Weeks    Status  New    Target Date  04/07/18      PT SHORT TERM GOAL #4   Title  Pt will verbalize understanding of fall prevention in home environment.    Time  4    Period  Weeks    Target Date  04/07/18        PT Long Term Goals - 03/09/18 1312      PT LONG TERM GOAL #1   Title  Pt will be independent  with  progression of HEP for improved mobility, strength, balance.  04/11/2018    Time  6    Period  Weeks    Status  New    Target Date  04/21/18      PT LONG TERM GOAL #2   Title  Pt will improve MiniBESTest score to at least 20/28 for decreased fall risk.    Time  6    Period  Weeks    Status  New    Target Date  04/21/18      PT LONG TERM GOAL #3   Title  Pt will ambulate at least 1000 ft, independently on indoor and outdoor surfaces for improved outdoor and unlevel surfaces gait.    Time  6    Period  Weeks    Status  New    Target Date  04/21/18      PT LONG TERM GOAL #4   Title  Pt will verbalize plans for ongoing community fitness upon d/c from PT.    Time  6    Period  Weeks    Status  New    Target Date  04/21/18            Plan - 03/21/18 1327    Clinical Impression Statement  Skilled PT session today focused on PWR! Moves in standing added to HEP to address posture, weightshifting, trunk flexibility and stepping, as well as dynamic gait and coordination activities.  Pt has some difficulty with coordination of reciprocal arm motion with marching activities.  Pt will continue to benefit from skilled PT to further address balance, posture, functional strengthening and gait.    Rehab Potential  Good    PT Frequency  2x / week    PT Duration  6 weeks    PT Treatment/Interventions  ADLs/Self Care Home Management;Gait training;Stair training;Functional mobility training;Therapeutic activities;Therapeutic exercise;Balance training;Neuromuscular re-education;Patient/family education    PT Next Visit Plan  Review HEP including PWR! Moves in standing, work on compliant surface and try standing PWR! Moves Flow, functional strengthening and balance activities    PT Home Exercise Plan  Access Code: [email or web address]    Consulted and Agree with Plan of Care  Patient       Patient will benefit from skilled therapeutic intervention in order to improve the following deficits and impairments:   Abnormal gait, Decreased balance, Decreased coordination, Decreased mobility, Decreased strength, Difficulty walking  Visit Diagnosis: Other abnormalities of gait and mobility  Unsteadiness  Other symptoms and signs involving the nervous system     Problem List Patient Active Problem List   Diagnosis Date Noted  . 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 Chronic nonallergic rhinitis 12/29/2016  . Mild persistent asthma, uncomplicated 11/94/1740  . 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 Gastroesophageal reflux disease 12/29/2016  . Cervical spondylosis with myelopathy and radiculopathy 11/14/2013  . Essential tremor 06/13/2013  . Cervical spondylosis without myelopathy 06/13/2013  . Breast cancer of lower-outer quadrant of left female breast ([place]) 09/10/2010    Dashonda Bonneau W. 03/21/2018, 1:31 PM Frazier Butt., PT [facility] [address] [place], [place], [address] Phone: [phone]   Fax:  [phone]  Name: April Church MRN: [id number] Date of Birth: 07-07-1940

## 2018-03-24 ENCOUNTER — Ambulatory Visit: Payer: Medicare Other | Admitting: Occupational Therapy

## 2018-03-24 ENCOUNTER — Ambulatory Visit: Payer: Medicare Other | Admitting: Physical Therapy

## 2018-03-24 DIAGNOSIS — R2689 Other abnormalities of gait and mobility: Secondary | ICD-10-CM

## 2018-03-24 DIAGNOSIS — R29818 Other symptoms and signs involving the nervous system: Secondary | ICD-10-CM

## 2018-03-24 DIAGNOSIS — R29898 Other symptoms and signs involving the musculoskeletal system: Secondary | ICD-10-CM

## 2018-03-24 DIAGNOSIS — M6281 Muscle weakness (generalized): Secondary | ICD-10-CM | POA: Diagnosis not present

## 2018-03-24 DIAGNOSIS — R278 Other lack of coordination: Secondary | ICD-10-CM

## 2018-03-24 DIAGNOSIS — R2681 Unsteadiness on feet: Secondary | ICD-10-CM | POA: Diagnosis not present

## 2018-03-24 NOTE — Therapy (Signed)
[facility] [address] [place], [place], [address] Phone: [phone]   Fax:  [phone]  Occupational Therapy Treatment  Patient Details  Name: April Church MRN: [id number] Date of Birth: 21-Apr-1940 Referring Provider (OT): Dr. Andrey Spearman   Encounter Date: 03/24/2018    Past Medical History:  Diagnosis Date  . Asthma    daily inhaler  . Breast cancer ([place])    left- radiation and surgery -dx. 2011- no further tx. now- [name] , [name]  . Cyst of finger 11/2011   annular cyst right long finger  . Dental crowns present   . Dermatitis   . Frequency of urination   . GERD (gastroesophageal reflux disease)   . Hemorrhoid   . Hyperlipidemia   . Hypertension    under control, has been on med. x 2 yrs.  [name] PONV (postoperative nausea and vomiting)   . Seasonal allergies   . Tremors of nervous system    hands  . Trigger finger of right hand 11/2011   long finger    Past Surgical History:  Procedure Laterality Date  . ANTERIOR CERVICAL DECOMPRESSION/DISCECTOMY FUSION 4 LEVELS N/A 11/14/2013   Procedure: ANTERIOR CERVICAL DECOMPRESSION/DISCECTOMY FUSION 4 LEVELS;  Surgeon: Newman Pies, MD;  Location: [facility];  Service: Neurosurgery;  Laterality: N/A;  C34 C45 C56 C67 anterior cervical fusion with interbody prosthesis plating and  bonegraft  . APPENDECTOMY  age 21  . BREAST LUMPECTOMY  06/16/2009   left; SLN bx.  [name] BREAST LUMPECTOMY  07/01/2009   re-excision  . BREAST SURGERY  1999   reduction  . CATARACT EXTRACTION, BILATERAL    . COLONOSCOPY WITH PROPOFOL N/A 12/03/2014   Procedure: COLONOSCOPY WITH PROPOFOL;  Surgeon: [name], MD;  Location: [facility];  Service: Endoscopy;  Laterality: N/A;  . FOOT SURGERY  06/22/11   left  . KNEE ARTHROSCOPY  03/12/2005   right  . KNEE ARTHROSCOPY     left  . NASAL SINUS SURGERY     x 2  . 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 TRIGGER FINGER RELEASE  12/16/2011   Procedure: RELEASE TRIGGER FINGER/A-1 PULLEY;  Surgeon: Tennis Must, MD;  Location: [facility];  Service: Orthopedics;  Laterality: Right;  RIGHT LONG FINGER TRIGGER RELEASE & ANNULAR CYST EXCISION  . TUMOR EXCISION  age 69   right arm    There were no vitals filed for this visit.          Treatment: Standing at table for modified quadraped PWR! up, rock x10 reps each as warm up, min v.c  Placing/ removing various sized pegs into semi circle with right hand, using 3-pt pinch, min v.c for techniques Graded clothespins, yellow-blue for sutained pinch with RUE, min v.c Picking up and stacking coins then placing in bank with right and left UE's mod difficulty with RUE and occasional min difficulty with left  Handwriting with sharpie wrapped with coban , min v.c and increased legibility                 OT Short Term Goals - 02/28/18 1152      OT SHORT TERM GOAL #1   Title  Pt will be independent with updated HEP--check STGs  03/29/18.    Time  4    Period  Weeks    Status  New      OT SHORT TERM GOAL #2  Title  Pt will be able to write short paragraph with 100% legibility using strategies/AE prn.    Baseline  -    Time  4    Period  Days    Status  New      OT SHORT TERM GOAL #3   Title  Pt will demo incr ease with use of utensils as shown by improving PPT#2 to less than 19sec.    Baseline  -    Period  Weeks    Status  New      OT SHORT TERM GOAL #4   Title  Pt will be able to fasten/unfasten 3 buttons in less than 30sec.    Baseline  -    Time  4    Period  Weeks    Status  New      OT SHORT TERM GOAL #5   Title  Pt will be able to pick up small objects using tip pinch at least 75% of the time with R hand.    Baseline  uses lateral pinch    Time  4    Period  Weeks    Status  New        OT Long Term Goals - 02/28/18 1156      OT LONG TERM GOAL #1   Title   Pt will verbalize understanding of strategies/AE to incr ease with ADLs (including writing, buttoning/dressing, picking up objects).--check LTGs 04/27/18.    Time  8    Period  Weeks    Status  New      OT LONG TERM GOAL #2   Title  Pt will improve coordination for ADLs as shown by improving time on 9-hole peg test by at least 4sec with R hand.    Baseline  39.47sec    Time  8    Period  Weeks    Status  New      OT LONG TERM GOAL #3   Title  Pt will improve coordination/functional reaching for ADLs as shown by improving score on box and blocks with RUE by at least 4 blocks    Baseline  RUE 38 blocks    Time  8    Period  Weeks    Status  New      OT LONG TERM GOAL #4   Title  Pt will improve R grip strength by at least 4lbs to assist in opening containers.    Baseline  33lbs    Time  8    Period  Weeks    Status  New      OT LONG TERM GOAL #5   Title  Pt will demo at least 140* R shoulder flex with -10* elbow ext for functional reaching for ADLs.    Baseline  130* with -15* elbow ext    Time  8    Period  Weeks    Status  New            Plan - 03/24/18 1305    Clinical Impression Statement  Pt is progressing towards goals. She responds well to strategies for RUE use.    Occupational Profile and client history currently impacting functional performance  Pt is mod I for ADLs/IADLs, but reports that she is using L nondominant UE more for ADLs due to incr difficulty with RUE.      Occupational performance deficits (Please refer to evaluation for details):  ADL's;IADL's;Leisure;Social Participation    OT Treatment/Interventions  Aquatic Therapy;Paraffin;Neuromuscular  education;Splinting;Patient/family education;Therapeutic activities;Functional Mobility Training;Energy conservation;Fluidtherapy;Cryotherapy;Ultrasound;DME and/or AE instruction;Manual Therapy;Passive range of motion;Cognitive remediation/compensation;Therapeutic exercise;Self-care/ADL training;Moist Heat    Plan   continue to work towards short term goals, ADL strategies, tip pinch for picking up small objects    Consulted and Agree with Plan of Care  Patient       Patient will benefit from skilled therapeutic intervention in order to improve the following deficits and impairments:     Visit Diagnosis: No diagnosis found.    Problem List Patient Active Problem List   Diagnosis Date Noted  . 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 Chronic nonallergic rhinitis 12/29/2016  . Mild persistent asthma, uncomplicated 22/03/5425  . 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 Gastroesophageal reflux disease 12/29/2016  . Cervical spondylosis with myelopathy and radiculopathy 11/14/2013  . Essential tremor 06/13/2013  . Cervical spondylosis without myelopathy 06/13/2013  . Breast cancer of lower-outer quadrant of left female breast ([place]) 09/10/2010    [name],[name] 03/24/2018, 1:09 PM  [facility] [address] [place], [place], [address] Phone: [phone]   Fax:  [phone]  Name: April Church MRN: [id number] Date of Birth: 03/30/1940

## 2018-03-25 ENCOUNTER — Encounter: Payer: Self-pay | Admitting: Physical Therapy

## 2018-03-25 NOTE — Therapy (Signed)
[facility] [address] [place], [place], [address] Phone: [phone]   Fax:  [phone]  Physical Therapy Treatment  Patient Details  Name: April Church MRN: [id number] Date of Birth: Mar 24, 1940 Referring Provider (PT): Penumalli   Encounter Date: 03/24/2018  PT End of Session - 03/25/18 1506    Visit Number  5    Number of Visits  13    Date for PT Re-Evaluation  06/07/18    Authorization Type  Medicare, BCBS supplement-10th visit progress note    PT Start Time  1102    PT Stop Time  1144    PT Time Calculation (min)  42 min    Activity Tolerance  Patient tolerated treatment well    Behavior During Therapy  [facility] for tasks assessed/performed       Past Medical History:  Diagnosis Date  . Asthma    daily inhaler  . Breast cancer ([place])    left- radiation and surgery -dx. 2011- no further tx. now- [name] , [name]  . Cyst of finger 11/2011   annular cyst right long finger  . Dental crowns present   . Dermatitis   . Frequency of urination   . GERD (gastroesophageal reflux disease)   . Hemorrhoid   . Hyperlipidemia   . Hypertension    under control, has been on med. x 2 yrs.  [name] PONV (postoperative nausea and vomiting)   . Seasonal allergies   . Tremors of nervous system    hands  . Trigger finger of right hand 11/2011   long finger    Past Surgical History:  Procedure Laterality Date  . ANTERIOR CERVICAL DECOMPRESSION/DISCECTOMY FUSION 4 LEVELS N/A 11/14/2013   Procedure: ANTERIOR CERVICAL DECOMPRESSION/DISCECTOMY FUSION 4 LEVELS;  Surgeon: Newman Pies, MD;  Location: [facility];  Service: Neurosurgery;  Laterality: N/A;  C34 C45 C56 C67 anterior cervical fusion with interbody prosthesis plating and  bonegraft  . APPENDECTOMY  age 7  . BREAST LUMPECTOMY  06/16/2009   left; SLN bx.  [name] BREAST LUMPECTOMY  07/01/2009   re-excision  . BREAST SURGERY  1999   reduction  . CATARACT  EXTRACTION, BILATERAL    . COLONOSCOPY WITH PROPOFOL N/A 12/03/2014   Procedure: COLONOSCOPY WITH PROPOFOL;  Surgeon: [name], MD;  Location[facility];  Service: Endoscopy;  Laterality: N/A;  . FOOT SURGERY  06/22/11   left  . KNEE ARTHROSCOPY  03/12/2005   right  . KNEE ARTHROSCOPY     left  . NASAL SINUS SURGERY     x 2  . 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 TRIGGER FINGER RELEASE  12/16/2011   Procedure: RELEASE TRIGGER FINGER/A-1 PULLEY;  Surgeon: Tennis Must, MD;  Location: [facility];  Service: Orthopedics;  Laterality: Right;  RIGHT LONG FINGER TRIGGER RELEASE & ANNULAR CYST EXCISION  . TUMOR EXCISION  age 52   right arm    There were no vitals filed for this visit.  Subjective Assessment - 03/25/18 1447    Subjective  No changes since last visit.  "You'd think since I've done these (PWR!) exercises since the beginning, that I could do them better."    Pertinent History  Parkinson's disease    Patient Stated Goals  Pt's goal for therapy is to be able to get back to moving better, moving more.    Currently in Pain?  No/denies  [facility] Adult PT Treatment/Exercise - 03/25/18 1450      Transfers   Transfers  Sit to Stand;Stand to Sit    Sit to Stand  6: Modified independent (Device/Increase time);5: Supervision;Without upper extremity assist;From chair/3-in-1    Stand to Sit  6: Modified independent (Device/Increase time);5: Supervision;Without upper extremity assist;To chair/3-in-1    Stand to Sit Details (indicate cue type and reason)  Verbal cues for technique   Cues for slowed transition to sit   Number of Reps  2 sets;Other reps (comment)   5 reps from chair   Comments  then sit<>stand from low, soft surface, x 5 reps, replicating recliner.      Ambulation/Gait   Ambulation/Gait  Yes    Ambulation/Gait Assistance  6: Modified independent (Device/Increase time)     Ambulation Distance (Feet)  800 Feet    Assistive device  None    Gait Pattern  Step-through pattern;Decreased arm swing - right;Decreased step length - right;Narrow base of support    Ambulation Surface  Level;Indoor    Gait Comments  Conversation tasks during gait, no LOB; transition with forward/back walking 4 reps x 10 ft.      High Level Balance   High Level Balance Comments  Forward step and weightshift x 10 reps, then back step and weightshift x 10 reps; on foam Airex mat marching in place, forward step and weightshift x 10, back step and weightshift x 10, the forward<>back step and weightshift x 10 reps, cues for foot clearance.      Exercises   Exercises  Other Exercises    Other Exercises   Standing hamstring stretch (L-stretch) at counter, 5 reps; runner's stretch 3 x 15 seconds, standing stagger stance forward/back rocking for active ankle dorsiflexion (based on pt's reports of leg fatigue and cramping)        PWR ([facility]) - 03/24/18 1120    PWR! exercises  Moves in standing    PWR! Up  x 10 reps    PWR! Rock  x 10 reps each side    PWR! Twist  x 10 reps each side    PWR Step  x 10 reps each side    Comments  Review of HEP provided last visit for PWR! Moves in standing; pt needs min verbal cues to initiate correct technique and occasional cues to continue correct technique throughout full set.            PT Short Term Goals - 03/09/18 1309      PT SHORT TERM GOAL #1   Title  Pt will be independent with HEP for improved balance, functional strength and gait.  TARGET 04/07/2018    Time  4    Period  Weeks    Status  New    Target Date  04/07/18      PT SHORT TERM GOAL #2   Title  Pt will improve 5x sit<>stand to less than or equal to 15 seconds for decreased fall risk.    Time  4    Period  Weeks    Status  New    Target Date  04/07/18      PT SHORT TERM GOAL #3   Title  Pt will improve TUG cognitive score to less than or equal to 10% difference of TUG score,  for improved dual tasking with gait.    Time  4    Period  Weeks    Status  New    Target Date  04/07/18  PT SHORT TERM GOAL #4   Title  Pt will verbalize understanding of fall prevention in home environment.    Time  4    Period  Weeks    Target Date  04/07/18        PT Long Term Goals - 03/09/18 1312      PT LONG TERM GOAL #1   Title  Pt will be independent with progression of HEP for improved mobility, strength, balance.  04/11/2018    Time  6    Period  Weeks    Status  New    Target Date  04/21/18      PT LONG TERM GOAL #2   Title  Pt will improve MiniBESTest score to at least 20/28 for decreased fall risk.    Time  6    Period  Weeks    Status  New    Target Date  04/21/18      PT LONG TERM GOAL #3   Title  Pt will ambulate at least 1000 ft, independently on indoor and outdoor surfaces for improved outdoor and unlevel surfaces gait.    Time  6    Period  Weeks    Status  New    Target Date  04/21/18      PT LONG TERM GOAL #4   Title  Pt will verbalize plans for ongoing community fitness upon d/c from PT.    Time  6    Period  Weeks    Status  New    Target Date  04/21/18            Plan - 03/25/18 1506    Clinical Impression Statement  Review of standing PWR! Moves, with pt needing cues for technique through the exercises.  Because pt had some difficulty recalling these standing PWR! Moves, did not work on [employer]! Moves FLOW for higher level balance activity.  Worked also on varied direction stepping on compliant surfaces, as well as sit<>stand and gait.    Rehab Potential  Good    PT Frequency  2x / week    PT Duration  6 weeks    PT Treatment/Interventions  ADLs/Self Care Home Management;Gait training;Stair training;Functional mobility training;Therapeutic activities;Therapeutic exercise;Balance training;Neuromuscular re-education;Patient/family education    PT Next Visit Plan  PWR! Moves in standing, compliant surfaces, PWR! Moves FLOW, functional  strengthening and balance    PT Home Exercise Plan  Access Code: [email or web address]    Consulted and Agree with Plan of Care  Patient       Patient will benefit from skilled therapeutic intervention in order to improve the following deficits and impairments:  Abnormal gait, Decreased balance, Decreased coordination, Decreased mobility, Decreased strength, Difficulty walking  Visit Diagnosis: Other abnormalities of gait and mobility  Other symptoms and signs involving the nervous system     Problem List Patient Active Problem List   Diagnosis Date Noted  . 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 Chronic nonallergic rhinitis 12/29/2016  . Mild persistent asthma, uncomplicated 35/70/1779  . 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 Gastroesophageal reflux disease 12/29/2016  . Cervical spondylosis with myelopathy and radiculopathy 11/14/2013  . Essential tremor 06/13/2013  . Cervical spondylosis without myelopathy 06/13/2013  . Breast cancer of lower-outer quadrant of left female breast ([place]) 09/10/2010    Corbin Falck W. 03/25/2018, 3:17 PM  Frazier Butt., PT   [facility] [address] [place], [place], [address] Phone: [phone]   Fax:  [phone]  Name: ROANNA REAVES MRN: [id number] Date of Birth: [birth year]

## 2018-03-28 ENCOUNTER — Encounter: Payer: Self-pay | Admitting: Physical Therapy

## 2018-03-28 ENCOUNTER — Encounter: Payer: Self-pay | Admitting: Occupational Therapy

## 2018-03-28 ENCOUNTER — Ambulatory Visit: Payer: Medicare Other | Attending: Internal Medicine | Admitting: Occupational Therapy

## 2018-03-28 ENCOUNTER — Ambulatory Visit: Payer: Medicare Other | Admitting: Physical Therapy

## 2018-03-28 DIAGNOSIS — R29898 Other symptoms and signs involving the musculoskeletal system: Secondary | ICD-10-CM | POA: Diagnosis not present

## 2018-03-28 DIAGNOSIS — R2689 Other abnormalities of gait and mobility: Secondary | ICD-10-CM | POA: Insufficient documentation

## 2018-03-28 DIAGNOSIS — R29818 Other symptoms and signs involving the nervous system: Secondary | ICD-10-CM | POA: Insufficient documentation

## 2018-03-28 DIAGNOSIS — R2681 Unsteadiness on feet: Secondary | ICD-10-CM | POA: Insufficient documentation

## 2018-03-28 DIAGNOSIS — M6281 Muscle weakness (generalized): Secondary | ICD-10-CM | POA: Insufficient documentation

## 2018-03-28 DIAGNOSIS — R251 Tremor, unspecified: Secondary | ICD-10-CM | POA: Diagnosis not present

## 2018-03-28 DIAGNOSIS — R278 Other lack of coordination: Secondary | ICD-10-CM | POA: Diagnosis not present

## 2018-03-28 DIAGNOSIS — R293 Abnormal posture: Secondary | ICD-10-CM | POA: Insufficient documentation

## 2018-03-28 NOTE — Therapy (Signed)
[facility] [address] [place], [place], [address] Phone: [phone]   Fax:  [phone]  Physical Therapy Treatment  Patient Details  Name: April Church MRN: [id number] Date of Birth: [birth year] Referring Provider (PT): Penumalli   Encounter Date: 03/28/2018  PT End of Session - 03/28/18 1102    Visit Number  6    Number of Visits  13    Date for PT Re-Evaluation  06/07/18    Authorization Type  Medicare, BCBS supplement-10th visit progress note    PT Start Time  1103    PT Stop Time  1145    PT Time Calculation (min)  42 min    Activity Tolerance  Patient tolerated treatment well    Behavior During Therapy  [facility] for tasks assessed/performed       Past Medical History:  Diagnosis Date  . Asthma    daily inhaler  . Breast cancer ([place])    left- radiation and surgery -dx. 2011- no further tx. now- [name] , [name]  . Cyst of finger 11/2011   annular cyst right long finger  . Dental crowns present   . Dermatitis   . Frequency of urination   . GERD (gastroesophageal reflux disease)   . Hemorrhoid   . Hyperlipidemia   . Hypertension    under control, has been on med. x 2 yrs.  [name] PONV (postoperative nausea and vomiting)   . Seasonal allergies   . Tremors of nervous system    hands  . Trigger finger of right hand 11/2011   long finger    Past Surgical History:  Procedure Laterality Date  . ANTERIOR CERVICAL DECOMPRESSION/DISCECTOMY FUSION 4 LEVELS N/A 11/14/2013   Procedure: ANTERIOR CERVICAL DECOMPRESSION/DISCECTOMY FUSION 4 LEVELS;  Surgeon: Newman Pies, MD;  Location: [facility];  Service: Neurosurgery;  Laterality: N/A;  C34 C45 C56 C67 anterior cervical fusion with interbody prosthesis plating and  bonegraft  . APPENDECTOMY  age 78  . BREAST LUMPECTOMY  06/16/2009   left; SLN bx.  [name] BREAST LUMPECTOMY  07/01/2009   re-excision  . BREAST SURGERY  1999   reduction  . CATARACT  EXTRACTION, BILATERAL    . COLONOSCOPY WITH PROPOFOL N/A 12/03/2014   Procedure: COLONOSCOPY WITH PROPOFOL;  Surgeon: [name], MD;  [facility];  Service: Endoscopy;  Laterality: N/A;  . FOOT SURGERY  06/22/11   left  . KNEE ARTHROSCOPY  03/12/2005   right  . KNEE ARTHROSCOPY     left  . NASAL SINUS SURGERY     x 2  . 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 TRIGGER FINGER RELEASE  12/16/2011   Procedure: RELEASE TRIGGER FINGER/A-1 PULLEY;  Surgeon: Tennis Must, MD;  Location: [facility];  Service: Orthopedics;  Laterality: Right;  RIGHT LONG FINGER TRIGGER RELEASE & ANNULAR CYST EXCISION  . TUMOR EXCISION  age 8   right arm    There were no vitals filed for this visit.  Subjective Assessment - 03/28/18 1102    Subjective  Finding a little time to do my exercises. Reports she is doing them 3 days in addition to the 2 days/week she comes to PT    Pertinent History  Parkinson's disease    Patient Stated Goals  Pt's goal for therapy is to be able to get back to moving better, moving more.    Currently in Pain?  No/denies  [facility] Adult PT Treatment/Exercise - 03/28/18 0001      Ambulation/Gait   Ambulation/Gait Assistance  5: Supervision    Ambulation/Gait Assistance Details  vc for step length, heelstrike    Ambulation Distance (Feet)  60 Feet   120   Assistive device  None    Gait Pattern  Step-through pattern;Decreased arm swing - right;Narrow base of support;Decreased stride length    Gait Comments  last minute/sudden cues to turn/change direction without imbalance        [facility]) - 03/28/18 1112    PWR! exercises  Moves in standing    PWR! Up  20    PWR! Rock  20    PWR! Twist  20    PWR Step  20    Basic 4 Flow  5    Comments  mod-max cues for technique for Rock, Twist and Step; max cues and dmeonstration for flow     Patient given HEP handout for standing  PWR! Exercises, yet still required incr cues and assist for technique  Balance Exercises - 03/28/18 2027      Balance Exercises: Standing   Stepping Strategy  Anterior;Posterior;Lateral;Foam/compliant surface;10 reps   each leg each direction; blue foam mat   Rockerboard  Anterior/posterior;Head turns;EO;EC;Intermittent UE support   single foot step off fwd/step back on   Tandem Gait  Forward;Intermittent upper extremity support;Foam/compliant surface;4 reps    Retro Gait  Foam/compliant surface;4 reps          PT Short Term Goals - 03/09/18 1309      PT SHORT TERM GOAL #1   Title  Pt will be independent with HEP for improved balance, functional strength and gait.  TARGET 04/07/2018    Time  4    Period  Weeks    Status  New    Target Date  04/07/18      PT SHORT TERM GOAL #2   Title  Pt will improve 5x sit<>stand to less than or equal to 15 seconds for decreased fall risk.    Time  4    Period  Weeks    Status  New    Target Date  04/07/18      PT SHORT TERM GOAL #3   Title  Pt will improve TUG cognitive score to less than or equal to 10% difference of TUG score, for improved dual tasking with gait.    Time  4    Period  Weeks    Status  New    Target Date  04/07/18      PT SHORT TERM GOAL #4   Title  Pt will verbalize understanding of fall prevention in home environment.    Time  4    Period  Weeks    Target Date  04/07/18        PT Long Term Goals - 03/09/18 1312      PT LONG TERM GOAL #1   Title  Pt will be independent with progression of HEP for improved mobility, strength, balance.  04/11/2018    Time  6    Period  Weeks    Status  New    Target Date  04/21/18      PT LONG TERM GOAL #2   Title  Pt will improve MiniBESTest score to at least 20/28 for decreased fall risk.    Time  6    Period  Weeks    Status  New    Target Date  04/21/18  PT LONG TERM GOAL #3   Title  Pt will ambulate at least 1000 ft, independently on indoor and outdoor  surfaces for improved outdoor and unlevel surfaces gait.    Time  6    Period  Weeks    Status  New    Target Date  04/21/18      PT LONG TERM GOAL #4   Title  Pt will verbalize plans for ongoing community fitness upon d/c from PT.    Time  6    Period  Weeks    Status  New    Target Date  04/21/18            Plan - 03/28/18 2029    Clinical Impression Statement  Continued education on PWR! standing exercises for technique to include increased ROM and intensity of movements. Briefly discussed PWR! Moves classes to consider upon discharge for continued instruction in exercise technique. Also showed pt where to find exercises on YouTube for demonstration. Patient demonstrated difficulty focusing on a repetitive task during exercises, however had a very hard time attempting Flow with exercises as a novel task. Continued balance and stepping strategies for balance recovery along with ankle and hip strategy training. Patient can continue to benefit from PT working towards STGs to be assessed by 2/14.     Rehab Potential  Good    PT Frequency  2x / week    PT Duration  6 weeks    PT Treatment/Interventions  ADLs/Self Care Home Management;Gait training;Stair training;Functional mobility training;Therapeutic activities;Therapeutic exercise;Balance training;Neuromuscular re-education;Patient/family education    PT Next Visit Plan  PWR! Moves in standing, compliant surfaces, PWR! Moves FLOW, functional strengthening and balance    PT Home Exercise Plan  Access Code: [email or web address]    Consulted and Agree with Plan of Care  Patient       Patient will benefit from skilled therapeutic intervention in order to improve the following deficits and impairments:  Abnormal gait, Decreased balance, Decreased coordination, Decreased mobility, Decreased strength, Difficulty walking  Visit Diagnosis: Other abnormalities of gait and mobility  Other symptoms and signs involving the nervous  system  Unsteadiness     Problem List Patient Active Problem List   Diagnosis Date Noted  . 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 Chronic nonallergic rhinitis 12/29/2016  . Mild persistent asthma, uncomplicated 12/75/1700  . 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 Gastroesophageal reflux disease 12/29/2016  . Cervical spondylosis with myelopathy and radiculopathy 11/14/2013  . Essential tremor 06/13/2013  . Cervical spondylosis without myelopathy 06/13/2013  . Breast cancer of lower-outer quadrant of left female breast ([place]) 09/10/2010    Rexanne Mano, PT 03/28/2018, 8:35 PM  [facility] [address] [place], [place], [address] Phone: [phone]   Fax:  [phone]  Name: NILAYA BOUIE MRN: [id number] Date of Birth: Aug 15, 1940

## 2018-03-28 NOTE — Therapy (Signed)
[facility] [address] [place], [place], [address] Phone: [phone]   Fax:  [phone]  Occupational Therapy Treatment  Patient Details  Name: April Church MRN: [id number] Date of Birth: 08-13-1940 Referring Provider (OT): Dr. Andrey Spearman   Encounter Date: 03/28/2018  OT End of Session - 03/28/18 1158    Visit Number  7    Number of Visits  17    Date for OT Re-Evaluation  05/28/18    Authorization Type  Medicare & BCBS, covered 100%     Authorization - Visit Number  7    Authorization - Number of Visits  10    OT Start Time  7564    OT Stop Time  1232    OT Time Calculation (min)  40 min    Activity Tolerance  Patient tolerated treatment well    Behavior During Therapy  [facility] for tasks assessed/performed       Past Medical History:  Diagnosis Date  . Asthma    daily inhaler  . Breast cancer ([place])    left- radiation and surgery -dx. 2011- no further tx. now- [name] , [name]  . Cyst of finger 11/2011   annular cyst right long finger  . Dental crowns present   . Dermatitis   . Frequency of urination   . GERD (gastroesophageal reflux disease)   . Hemorrhoid   . Hyperlipidemia   . Hypertension    under control, has been on med. x 2 yrs.  [name] PONV (postoperative nausea and vomiting)   . Seasonal allergies   . Tremors of nervous system    hands  . Trigger finger of right hand 11/2011   long finger    Past Surgical History:  Procedure Laterality Date  . ANTERIOR CERVICAL DECOMPRESSION/DISCECTOMY FUSION 4 LEVELS N/A 11/14/2013   Procedure: ANTERIOR CERVICAL DECOMPRESSION/DISCECTOMY FUSION 4 LEVELS;  Surgeon: Newman Pies, MD;  Location: [facility];  Service: Neurosurgery;  Laterality: N/A;  C34 C45 C56 C67 anterior cervical fusion with interbody prosthesis plating and  bonegraft  . APPENDECTOMY  age 26  . BREAST LUMPECTOMY  06/16/2009   left; SLN bx.  [name] BREAST LUMPECTOMY  07/01/2009    re-excision  . BREAST SURGERY  1999   reduction  . CATARACT EXTRACTION, BILATERAL    . COLONOSCOPY WITH PROPOFOL N/A 12/03/2014   Procedure: COLONOSCOPY WITH PROPOFOL;  Surgeon: [name], MD;  Location: [facility];  Service: Endoscopy;  Laterality: N/A;  . FOOT SURGERY  06/22/11   left  . KNEE ARTHROSCOPY  03/12/2005   right  . KNEE ARTHROSCOPY     left  . NASAL SINUS SURGERY     x 2  . 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 TRIGGER FINGER RELEASE  12/16/2011   Procedure: RELEASE TRIGGER FINGER/A-1 PULLEY;  Surgeon: Tennis Must, MD;  Location: [facility];  Service: Orthopedics;  Laterality: Right;  RIGHT LONG FINGER TRIGGER RELEASE & ANNULAR CYST EXCISION  . TUMOR EXCISION  age 61   right arm    There were no vitals filed for this visit.  Subjective Assessment - 03/28/18 1156    Subjective   DAT scan scheduled for next Wednesday    Pertinent History  Parkinson's disease; hx of Kidney donor, hx of L breast CA, ET, cervical spondylosis and cervical decompression/fusion surgery, hx of R 3rd digit trigger finger release  and cyst, HTN, hyperlipidemia, hx  of RUE tumor excision     Patient Stated Goals  improve ability to pick up coins/pils with R hand, writing, use of utensils    Currently in Pain?  No/denies       Placing various sized pegs in arc pegboard with R hand with min cueing for tip pinch and min-mod difficulty with tremors.  Picking up dried beans with R hand with use of tip pinch with min-mod difficulty.  Reviewed use of shelf liner to make task easier.  Pt instructed in adaptive strategies for eating including:  Sipping soup from cup, getting close to table, supporting table on tabletop, trial of red foam grips for utensils (and issued 2 for home as pt demo incr control), having restaurant/husband cut food, using smaller sized food (e.g. small pasta noodle vs. Spaghetti), trial use of swivel spoon (difficulty  scooping), discussed Liftwear utensils for tremors (pt shown video), wraps vs. Sandwiches, travel mugs, eating from bowl for easier scooping.  Pt verbalized understanding.      OT Short Term Goals - 02/28/18 1152      OT SHORT TERM GOAL #1   Title  Pt will be independent with updated HEP--check STGs  03/29/18.    Time  4    Period  Weeks    Status  New      OT SHORT TERM GOAL #2   Title  Pt will be able to write short paragraph with 100% legibility using strategies/AE prn.    Baseline  -    Time  4    Period  Days    Status  New      OT SHORT TERM GOAL #3   Title  Pt will demo incr ease with use of utensils as shown by improving PPT#2 to less than 19sec.    Baseline  -    Period  Weeks    Status  New      OT SHORT TERM GOAL #4   Title  Pt will be able to fasten/unfasten 3 buttons in less than 30sec.    Baseline  -    Time  4    Period  Weeks    Status  New      OT SHORT TERM GOAL #5   Title  Pt will be able to pick up small objects using tip pinch at least 75% of the time with R hand.    Baseline  uses lateral pinch    Time  4    Period  Weeks    Status  New        OT Long Term Goals - 02/28/18 1156      OT LONG TERM GOAL #1   Title  Pt will verbalize understanding of strategies/AE to incr ease with ADLs (including writing, buttoning/dressing, picking up objects).--check LTGs 04/27/18.    Time  8    Period  Weeks    Status  New      OT LONG TERM GOAL #2   Title  Pt will improve coordination for ADLs as shown by improving time on 9-hole peg test by at least 4sec with R hand.    Baseline  39.47sec    Time  8    Period  Weeks    Status  New      OT LONG TERM GOAL #3   Title  Pt will improve coordination/functional reaching for ADLs as shown by improving score on box and blocks with RUE by at least 4 blocks  Baseline  RUE 38 blocks    Time  8    Period  Weeks    Status  New      OT LONG TERM GOAL #4   Title  Pt will improve R grip strength by at least 4lbs  to assist in opening containers.    Baseline  33lbs    Time  8    Period  Weeks    Status  New      OT LONG TERM GOAL #5   Title  Pt will demo at least 140* R shoulder flex with -10* elbow ext for functional reaching for ADLs.    Baseline  130* with -15* elbow ext    Time  8    Period  Weeks    Status  New            Plan - 03/28/18 1159    Clinical Impression Statement  Pt is progressing towards goals with understanding of strategies for ADLs.    Occupational Profile and client history currently impacting functional performance  Pt is mod I for ADLs/IADLs, but reports that she is using L nondominant UE more for ADLs due to incr difficulty with RUE.      Occupational performance deficits (Please refer to evaluation for details):  ADL's;IADL's;Leisure;Social Participation    Rehab Potential  Good    OT Frequency  2x / week    OT Duration  8 weeks    OT Treatment/Interventions  Aquatic Therapy;Paraffin;Neuromuscular education;Splinting;Patient/family education;Therapeutic activities;Functional Mobility Training;Energy conservation;Fluidtherapy;Cryotherapy;Ultrasound;DME and/or AE instruction;Manual Therapy;Passive range of motion;Cognitive remediation/compensation;Therapeutic exercise;Self-care/ADL training;Moist Heat    Plan  continue to work towards short term goals/begin checking STGs, ADL strategies, tip pinch for picking up small objects    Consulted and Agree with Plan of Care  Patient       Patient will benefit from skilled therapeutic intervention in order to improve the following deficits and impairments:  Decreased cognition, Decreased mobility, Decreased coordination, Decreased activity tolerance, Decreased range of motion, Decreased strength, Impaired tone, Improper spinal/pelvic alignment, Impaired UE functional use, Impaired perceived functional ability, Decreased balance  Visit Diagnosis: Other symptoms and signs involving the nervous system  Other lack of  coordination  Unsteadiness  Tremor  Other symptoms and signs involving the musculoskeletal system  Other abnormalities of gait and mobility  Muscle weakness (generalized)  Abnormal posture    Problem List Patient Active Problem List   Diagnosis Date Noted  . 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 Chronic nonallergic rhinitis 12/29/2016  . Mild persistent asthma, uncomplicated 98/26/4158  . 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 Gastroesophageal reflux disease 12/29/2016  . Cervical spondylosis with myelopathy and radiculopathy 11/14/2013  . Essential tremor 06/13/2013  . Cervical spondylosis without myelopathy 06/13/2013  . Breast cancer of lower-outer quadrant of left female breast ([place]) 09/10/2010    [facility] 03/28/2018, 3:10 PM  [facility] [address] [place], [place], [address] Phone: [phone]   Fax:  [phone]  Name: April Church MRN: [id number] Date of Birth: July 26, 1940   Vianne Bulls, OTR/L [facility] [address] [place], [place]  [address] [phone] phone [phone] 03/28/18 3:10 PM

## 2018-03-31 ENCOUNTER — Ambulatory Visit: Payer: Medicare Other | Admitting: Physical Therapy

## 2018-03-31 ENCOUNTER — Ambulatory Visit: Payer: Medicare Other | Admitting: Occupational Therapy

## 2018-03-31 ENCOUNTER — Encounter: Payer: Self-pay | Admitting: Physical Therapy

## 2018-03-31 DIAGNOSIS — M6281 Muscle weakness (generalized): Secondary | ICD-10-CM

## 2018-03-31 DIAGNOSIS — R251 Tremor, unspecified: Secondary | ICD-10-CM

## 2018-03-31 DIAGNOSIS — R2689 Other abnormalities of gait and mobility: Secondary | ICD-10-CM

## 2018-03-31 DIAGNOSIS — R2681 Unsteadiness on feet: Secondary | ICD-10-CM | POA: Diagnosis not present

## 2018-03-31 DIAGNOSIS — R29818 Other symptoms and signs involving the nervous system: Secondary | ICD-10-CM | POA: Diagnosis not present

## 2018-03-31 DIAGNOSIS — R278 Other lack of coordination: Secondary | ICD-10-CM | POA: Diagnosis not present

## 2018-03-31 DIAGNOSIS — R29898 Other symptoms and signs involving the musculoskeletal system: Secondary | ICD-10-CM | POA: Diagnosis not present

## 2018-03-31 NOTE — Patient Instructions (Signed)
  Also provided pictures of forward step and weightshift x 10, back step and weightshift (with counter support) x 10

## 2018-03-31 NOTE — Therapy (Signed)
[facility] [address] [place], [place], [address] Phone: [phone]   Fax:  [phone]  Physical Therapy Treatment  Patient Details  Name: April Church MRN: [id number] Date of Birth: [birth year] Referring Provider (PT): Penumalli   Encounter Date: 03/31/2018  PT End of Session - 03/31/18 1707    Visit Number  7    Number of Visits  13    Date for PT Re-Evaluation  06/07/18    Authorization Type  Medicare, BCBS supplement-10th visit progress note    PT Start Time  1104    PT Stop Time  1145    PT Time Calculation (min)  41 min    Activity Tolerance  Patient tolerated treatment well    Behavior During Therapy  [facility] for tasks assessed/performed       Past Medical History:  Diagnosis Date  . Asthma    daily inhaler  . Breast cancer ([place])    left- radiation and surgery -dx. 2011- no further tx. now- [name] , [name]  . Cyst of finger 11/2011   annular cyst right long finger  . Dental crowns present   . Dermatitis   . Frequency of urination   . GERD (gastroesophageal reflux disease)   . Hemorrhoid   . Hyperlipidemia   . Hypertension    under control, has been on med. x 2 yrs.  [name] PONV (postoperative nausea and vomiting)   . Seasonal allergies   . Tremors of nervous system    hands  . Trigger finger of right hand 11/2011   long finger    Past Surgical History:  Procedure Laterality Date  . ANTERIOR CERVICAL DECOMPRESSION/DISCECTOMY FUSION 4 LEVELS N/A 11/14/2013   Procedure: ANTERIOR CERVICAL DECOMPRESSION/DISCECTOMY FUSION 4 LEVELS;  Surgeon: Newman Pies, MD;  Location: [facility];  Service: Neurosurgery;  Laterality: N/A;  C34 C45 C56 C67 anterior cervical fusion with interbody prosthesis plating and  bonegraft  . APPENDECTOMY  age 59  . BREAST LUMPECTOMY  06/16/2009   left; SLN bx.  [name] BREAST LUMPECTOMY  07/01/2009   re-excision  . BREAST SURGERY  1999   reduction  . CATARACT  EXTRACTION, BILATERAL    . COLONOSCOPY WITH PROPOFOL N/A 12/03/2014   Procedure: COLONOSCOPY WITH PROPOFOL;  Surgeon: [name], MD;  Location: [facility];  Service: Endoscopy;  Laterality: N/A;  . FOOT SURGERY  06/22/11   left  . KNEE ARTHROSCOPY  03/12/2005   right  . KNEE ARTHROSCOPY     left  . NASAL SINUS SURGERY     x 2  . 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 TRIGGER FINGER RELEASE  12/16/2011   Procedure: RELEASE TRIGGER FINGER/A-1 PULLEY;  Surgeon: Tennis Must, MD;  Location: [facility];  Service: Orthopedics;  Laterality: Right;  RIGHT LONG FINGER TRIGGER RELEASE & ANNULAR CYST EXCISION  . TUMOR EXCISION  age 1   right arm    There were no vitals filed for this visit.  Subjective Assessment - 03/31/18 1108    Subjective  No changes, nothing new.  Exercises going okay.    Pertinent History  Parkinson's disease    Patient Stated Goals  Pt's goal for therapy is to be able to get back to moving better, moving more.    Currently in Pain?  No/denies  [facility] Adult PT Treatment/Exercise - 03/31/18 0001      Standardized Balance Assessment   Standardized Balance Assessment  Timed Up and Go Test      Timed Up and Go Test   TUG  Normal TUG;Cognitive TUG    Normal TUG (seconds)  11.87    Cognitive TUG (seconds)  14.75        PWR ([facility]) - 03/31/18 1659    PWR! exercises  Moves in standing    PWR! Up  x 20    PWR! Rock  x 20    PWR! Twist  x 20    PWR Step  x 20    Comments  Visual cues of PWR! Moves handout (pt says she didn't have it at home-provided again)       Balance Exercises - 03/31/18 1701      Balance Exercises: Standing   Stepping Strategy  Anterior;Posterior;UE support;10 reps    Gait with Head Turns  Forward;Intermittent upper extremity support;2 reps   slowed pace, more difficulty with head turns to L   Retro Gait  Upper extremity support;2 reps;Other  (comment)   Forward/back at [employer]  Foam/compliant support;Upper extremity support;3 reps    Other Standing Exercises  On compliant surface:  marching in place x 10, alternating forward kicks x 15 reps, alternating forward step taps x 10 reps for improved compliant surface balance-pt requries UE support and has posterior LOB at times        PT Education - 03/31/18 1706    Education Details  Handouts of standing PWR! Moves, as well as forward step and weigthshfit, back step and weightshift (thought PT gave it prior to 03/24/18 visit, but pt states she doesn't have the handout); progress towards goals, POC    Person(s) Educated  Patient    Methods  Explanation;Demonstration;Verbal cues;Handout    Comprehension  Verbalized understanding;Returned demonstration;Verbal cues required       PT Short Term Goals - 03/31/18 1108      PT SHORT TERM GOAL #1   Title  Pt will be independent with HEP for improved balance, functional strength and gait.  TARGET 04/07/2018    Baseline  Needs cues for technique of PWR! Moves    Time  4    Period  Weeks    Status  Not Met      PT SHORT TERM GOAL #2   Title  Pt will improve 5x sit<>stand to less than or equal to 15 seconds for decreased fall risk.    Baseline  17.94 03/31/2018    Time  4    Period  Weeks    Status  Not Met      PT SHORT TERM GOAL #3   Title  Pt will improve TUG cognitive score to less than or equal to 10% difference of TUG score, for improved dual tasking with gait.    Time  4    Period  Weeks    Status  Not Met      PT SHORT TERM GOAL #4   Title  Pt will verbalize understanding of fall prevention in home environment.    Time  4    Period  Weeks    Status  Achieved        PT Long Term Goals - 03/09/18 1312      PT LONG TERM GOAL #1   Title  Pt will be independent with progression of HEP for improved mobility, strength, balance.  04/11/2018    Time  6    Period  Weeks    Status  New    Target Date  04/21/18       PT LONG TERM GOAL #2   Title  Pt will improve MiniBESTest score to at least 20/28 for decreased fall risk.    Time  6    Period  Weeks    Status  New    Target Date  04/21/18      PT LONG TERM GOAL #3   Title  Pt will ambulate at least 1000 ft, independently on indoor and outdoor surfaces for improved outdoor and unlevel surfaces gait.    Time  6    Period  Weeks    Status  New    Target Date  04/21/18      PT LONG TERM GOAL #4   Title  Pt will verbalize plans for ongoing community fitness upon d/c from PT.    Time  6    Period  Weeks    Status  New    Target Date  04/21/18            Plan - 03/31/18 1707    Clinical Impression Statement  Assessed STGs this visit, with pt not meeting STG 1, 2, 3.  She continues to need cues for technique of PWR! Moves exercises and reports performing several times per week.  Improved by less than 1 second on 5x sit<>stand score and pt conitnues to demonstrate>10% difference on TUG and TUG cognitive scores.  Discussed limitations with dual tasking with gait and need to focus solely on gait activities.  Pt has met STG 4 for fall prevention education.  Pt will continue to benefit from skilled PT to further address balance and mobility to work towards [place].    Rehab Potential  Good    PT Frequency  2x / week    PT Duration  6 weeks    PT Treatment/Interventions  ADLs/Self Care Home Management;Gait training;Stair training;Functional mobility training;Therapeutic activities;Therapeutic exercise;Balance training;Neuromuscular re-education;Patient/family education    PT Next Visit Plan  Review PWR! Moves in standing, compliant surfaces, PWR! Moves FLOW, functional strengthening and balance towards LTGs    PT Home Exercise Plan  Access Code: [email or web address]    Consulted and Agree with Plan of Care  Patient       Patient will benefit from skilled therapeutic intervention in order to improve the following deficits and impairments:  Abnormal gait, Decreased  balance, Decreased coordination, Decreased mobility, Decreased strength, Difficulty walking  Visit Diagnosis: Other abnormalities of gait and mobility  Other symptoms and signs involving the nervous system     Problem List Patient Active Problem List   Diagnosis Date Noted  . 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 Chronic nonallergic rhinitis 12/29/2016  . Mild persistent asthma, uncomplicated 62/37/6283  . 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 Gastroesophageal reflux disease 12/29/2016  . Cervical spondylosis with myelopathy and radiculopathy 11/14/2013  . Essential tremor 06/13/2013  . Cervical spondylosis without myelopathy 06/13/2013  . Breast cancer of lower-outer quadrant of left female breast ([place]) 09/10/2010    MARRIOTT,AMY W. 03/31/2018, 5:10 PM  Frazier Butt., PT   [facility] [address] [place], [place], [address] Phone: [phone]   Fax:  [phone]  Name: April Church MRN: [id number] Date of Birth: 1940/03/18

## 2018-03-31 NOTE — Therapy (Signed)
[facility] [address] [place], [place], [address] Phone: [phone]   Fax:  [phone]  Occupational Therapy Treatment  Patient Details  Name: April Church MRN: [id number] Date of Birth: [birth year] Referring Provider (OT): Dr. Andrey Spearman   Encounter Date: 03/31/2018  OT End of Session - 03/31/18 1150    Visit Number  8    Number of Visits  17    Date for OT Re-Evaluation  05/28/18    Authorization Type  Medicare & BCBS, covered 100%     Authorization - Visit Number  8    Authorization - Number of Visits  10    OT Start Time  4315    OT Stop Time  1230    OT Time Calculation (min)  43 min       Past Medical History:  Diagnosis Date  . Asthma    daily inhaler  . Breast cancer ([place])    left- radiation and surgery -dx. 2011- no further tx. now- [name] , [name]  . Cyst of finger 11/2011   annular cyst right long finger  . Dental crowns present   . Dermatitis   . Frequency of urination   . GERD (gastroesophageal reflux disease)   . Hemorrhoid   . Hyperlipidemia   . Hypertension    under control, has been on med. x 2 yrs.  [name] PONV (postoperative nausea and vomiting)   . Seasonal allergies   . Tremors of nervous system    hands  . Trigger finger of right hand 11/2011   long finger    Past Surgical History:  Procedure Laterality Date  . ANTERIOR CERVICAL DECOMPRESSION/DISCECTOMY FUSION 4 LEVELS N/A 11/14/2013   Procedure: ANTERIOR CERVICAL DECOMPRESSION/DISCECTOMY FUSION 4 LEVELS;  Surgeon: Newman Pies, MD;  Location: [facility];  Service: Neurosurgery;  Laterality: N/A;  C34 C45 C56 C67 anterior cervical fusion with interbody prosthesis plating and  bonegraft  . APPENDECTOMY  age 73  . BREAST LUMPECTOMY  06/16/2009   left; SLN bx.  [name] BREAST LUMPECTOMY  07/01/2009   re-excision  . BREAST SURGERY  1999   reduction  . CATARACT EXTRACTION, BILATERAL    . COLONOSCOPY WITH PROPOFOL  N/A 12/03/2014   Procedure: COLONOSCOPY WITH PROPOFOL;  Surgeon: [name], MD;  Location: [facility];  Service: Endoscopy;  Laterality: N/A;  . FOOT SURGERY  06/22/11   left  . KNEE ARTHROSCOPY  03/12/2005   right  . KNEE ARTHROSCOPY     left  . NASAL SINUS SURGERY     x 2  . 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 TRIGGER FINGER RELEASE  12/16/2011   Procedure: RELEASE TRIGGER FINGER/A-1 PULLEY;  Surgeon: Tennis Must, MD;  Location: [facility];  Service: Orthopedics;  Laterality: Right;  RIGHT LONG FINGER TRIGGER RELEASE & ANNULAR CYST EXCISION  . TUMOR EXCISION  age 64   right arm    There were no vitals filed for this visit.  Subjective Assessment - 03/31/18 1150    Pertinent History  Parkinson's disease; hx of Kidney donor, hx of L breast CA, ET, cervical spondylosis and cervical decompression/fusion surgery, hx of R 3rd digit trigger finger release  and cyst, HTN, hyperlipidemia, hx of RUE tumor excision     Patient Stated Goals  improve ability to pick up coins/pils with R hand, writing, use of utensils    Currently in Pain?  No/denies  Treatment: Therapist started checking progress towards goals. PWR! Hands PWR! Up, rock and twist. Reviewed strategies for feeding including changing grip on spoon and support elbow on table, min v.c Placing medium pegs in pegboard and checkers in connect 4 frame, min v.c for tip pinch, min-mod difficulty Pt wrote a paragraph with grossly 90% legibility using felt tip pen with coban.  PWR! Up and rock modified quadraped 10 reps each min v.c Dynamic step and reach for overhead reach with left and right UE's, min v.c for larger amplitude movements.                OT Short Term Goals - 03/31/18 1152      OT SHORT TERM GOAL #1   Title  Pt will be independent with updated HEP--check STGs  03/29/18.    Status  On-going      OT SHORT TERM GOAL #2   Title  Pt  will be able to write short paragraph with 100% legibility using strategies/AE prn.    Status  On-going      OT SHORT TERM GOAL #3   Title  Pt will demo incr ease with use of utensils as shown by improving PPT#2 to less than 19sec.    Status  Partially Met   met after pt was cued to place elbow oon table and change hand positon 15.06 secs, not met when pt does not use strategies>34 secs     OT SHORT TERM GOAL #4   Title  Pt will be able to fasten/unfasten 3 buttons in less than 30sec.    Status  On-going   30.81, 46.19     OT SHORT TERM GOAL #5   Title  Pt will be able to pick up small objects using tip pinch at least 75% of the time with R hand.    Status  On-going        OT Long Term Goals - 02/28/18 1156      OT LONG TERM GOAL #1   Title  Pt will verbalize understanding of strategies/AE to incr ease with ADLs (including writing, buttoning/dressing, picking up objects).--check LTGs 04/27/18.    Time  8    Period  Weeks    Status  New      OT LONG TERM GOAL #2   Title  Pt will improve coordination for ADLs as shown by improving time on 9-hole peg test by at least 4sec with R hand.    Baseline  39.47sec    Time  8    Period  Weeks    Status  New      OT LONG TERM GOAL #3   Title  Pt will improve coordination/functional reaching for ADLs as shown by improving score on box and blocks with RUE by at least 4 blocks    Baseline  RUE 38 blocks    Time  8    Period  Weeks    Status  New      OT LONG TERM GOAL #4   Title  Pt will improve R grip strength by at least 4lbs to assist in opening containers.    Baseline  33lbs    Time  8    Period  Weeks    Status  New      OT LONG TERM GOAL #5   Title  Pt will demo at least 140* R shoulder flex with -10* elbow ext for functional reaching for ADLs.    Baseline  130* with -  15* elbow ext    Time  8    Period  Weeks    Status  New            Plan - 03/31/18 1244    Clinical Impression Statement  Pt is progressing towards  goals with understanding of strategies for ADLs however she can benefit from reinforcement to use consistently.    Occupational Profile and client history currently impacting functional performance  Pt is mod I for ADLs/IADLs, but reports that she is using L nondominant UE more for ADLs due to incr difficulty with RUE.      Occupational performance deficits (Please refer to evaluation for details):  ADL's;IADL's;Leisure;Social Participation    Rehab Potential  Good    OT Frequency  2x / week    OT Duration  8 weeks    OT Treatment/Interventions  Aquatic Therapy;Paraffin;Neuromuscular education;Splinting;Patient/family education;Therapeutic activities;Functional Mobility Training;Energy conservation;Fluidtherapy;Cryotherapy;Ultrasound;DME and/or AE instruction;Manual Therapy;Passive range of motion;Cognitive remediation/compensation;Therapeutic exercise;Self-care/ADL training;Moist Heat    Plan  continue to work towards short term Audiological scientist STGs, ADL strategies, tip pinch for picking up small objects    Consulted and Agree with Plan of Care  Patient       Patient will benefit from skilled therapeutic intervention in order to improve the following deficits and impairments:  Decreased cognition, Decreased mobility, Decreased coordination, Decreased activity tolerance, Decreased range of motion, Decreased strength, Impaired tone, Improper spinal/pelvic alignment, Impaired UE functional use, Impaired perceived functional ability, Decreased balance  Visit Diagnosis: Tremor  Other symptoms and signs involving the musculoskeletal system  Muscle weakness (generalized)  Other lack of coordination  Other symptoms and signs involving the nervous system    Problem List Patient Active Problem List   Diagnosis Date Noted  . 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 Chronic nonallergic rhinitis 12/29/2016  . Mild persistent asthma, uncomplicated 41/74/0814  . Vocal fold paralysis, bilateral  12/29/2016  . Gastroesophageal reflux disease 12/29/2016  . Cervical spondylosis with myelopathy and radiculopathy 11/14/2013  . Essential tremor 06/13/2013  . Cervical spondylosis without myelopathy 06/13/2013  . Breast cancer of lower-outer quadrant of left female breast ([place]) 09/10/2010    [name],[name] 03/31/2018, 12:46 PM  [facility] [address] [place], [place], [address] Phone: [phone]   Fax:  [phone]  Name: April Church MRN: [id number] Date of Birth: [birth year]

## 2018-04-04 ENCOUNTER — Encounter: Payer: Self-pay | Admitting: Physical Therapy

## 2018-04-04 ENCOUNTER — Encounter: Payer: Self-pay | Admitting: Occupational Therapy

## 2018-04-04 ENCOUNTER — Ambulatory Visit: Payer: Medicare Other | Admitting: Occupational Therapy

## 2018-04-04 ENCOUNTER — Ambulatory Visit: Payer: Medicare Other | Admitting: Physical Therapy

## 2018-04-04 DIAGNOSIS — R2689 Other abnormalities of gait and mobility: Secondary | ICD-10-CM

## 2018-04-04 DIAGNOSIS — R251 Tremor, unspecified: Secondary | ICD-10-CM

## 2018-04-04 DIAGNOSIS — R293 Abnormal posture: Secondary | ICD-10-CM

## 2018-04-04 DIAGNOSIS — R2681 Unsteadiness on feet: Secondary | ICD-10-CM | POA: Diagnosis not present

## 2018-04-04 DIAGNOSIS — R278 Other lack of coordination: Secondary | ICD-10-CM | POA: Diagnosis not present

## 2018-04-04 DIAGNOSIS — R29818 Other symptoms and signs involving the nervous system: Secondary | ICD-10-CM

## 2018-04-04 DIAGNOSIS — R29898 Other symptoms and signs involving the musculoskeletal system: Secondary | ICD-10-CM | POA: Diagnosis not present

## 2018-04-04 DIAGNOSIS — M6281 Muscle weakness (generalized): Secondary | ICD-10-CM

## 2018-04-04 NOTE — Therapy (Signed)
[facility] [address] [place], [place], [address] Phone: [phone]   Fax:  [phone]  Occupational Therapy Treatment  Patient Details  Name: April Church MRN: [id number] Date of Birth: [birth year] Referring Provider (OT): Dr. Andrey Spearman   Encounter Date: 04/04/2018  OT End of Session - 04/04/18 1104    Visit Number  9    Number of Visits  17    Date for OT Re-Evaluation  05/28/18    Authorization Type  Medicare & BCBS, covered 100%     Authorization - Visit Number  9    Authorization - Number of Visits  10    OT Start Time  1105    OT Stop Time  1145    OT Time Calculation (min)  40 min    Activity Tolerance  Patient tolerated treatment well    Behavior During Therapy  [facility] for tasks assessed/performed       Past Medical History:  Diagnosis Date  . Asthma    daily inhaler  . Breast cancer ([place])    left- radiation and surgery -dx. 2011- no further tx. now- [name] , [name]  . Cyst of finger 11/2011   annular cyst right long finger  . Dental crowns present   . Dermatitis   . Frequency of urination   . GERD (gastroesophageal reflux disease)   . Hemorrhoid   . Hyperlipidemia   . Hypertension    under control, has been on med. x 2 yrs.  [name] PONV (postoperative nausea and vomiting)   . Seasonal allergies   . Tremors of nervous system    hands  . Trigger finger of right hand 11/2011   long finger    Past Surgical History:  Procedure Laterality Date  . ANTERIOR CERVICAL DECOMPRESSION/DISCECTOMY FUSION 4 LEVELS N/A 11/14/2013   Procedure: ANTERIOR CERVICAL DECOMPRESSION/DISCECTOMY FUSION 4 LEVELS;  Surgeon: Newman Pies, MD;  Location: [facility];  Service: Neurosurgery;  Laterality: N/A;  C34 C45 C56 C67 anterior cervical fusion with interbody prosthesis plating and  bonegraft  . APPENDECTOMY  age 60  . BREAST LUMPECTOMY  06/16/2009   left; SLN bx.  [name] BREAST LUMPECTOMY  07/01/2009    re-excision  . BREAST SURGERY  1999   reduction  . CATARACT EXTRACTION, BILATERAL    . COLONOSCOPY WITH PROPOFOL N/A 12/03/2014   Procedure: COLONOSCOPY WITH PROPOFOL;  Surgeon: [name], MD;  Location: [facility];  Service: Endoscopy;  Laterality: N/A;  . FOOT SURGERY  06/22/11   left  . KNEE ARTHROSCOPY  03/12/2005   right  . KNEE ARTHROSCOPY     left  . NASAL SINUS SURGERY     x 2  . 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 TRIGGER FINGER RELEASE  12/16/2011   Procedure: RELEASE TRIGGER FINGER/A-1 PULLEY;  Surgeon: Tennis Must, MD;  Location: [facility];  Service: Orthopedics;  Laterality: Right;  RIGHT LONG FINGER TRIGGER RELEASE & ANNULAR CYST EXCISION  . TUMOR EXCISION  age 78   right arm    There were no vitals filed for this visit.  Subjective Assessment - 04/04/18 1103    Subjective   nothing new    Pertinent History  Parkinson's disease; hx of Kidney donor, hx of L breast CA, ET, cervical spondylosis and cervical decompression/fusion surgery, hx of R 3rd digit trigger finger release  and cyst, HTN, hyperlipidemia, hx of RUE tumor excision  Patient Stated Goals  improve ability to pick up coins/pils with R hand, writing, use of utensils    Currently in Pain?  No/denies       Practiced writing with felt pen/coban grip with approx 90% legibility  (6-7 sentences).   Min cueing to stop and stretch hand periodically.  Then continuous "l" (and reverse loops) with mod difficulty with spacing.  Then tried with "The Pencil Grip" with incr success.  Issued Pencil Grip for home after education in use.  Thumb palmar abduction over edge of cone for AAROM with min difficulty.  Thumb extension and palmar abduction AROM with min cueing to decr compensation.  Picking up medium pegs with min-mod cueing for use of tip pinch.  Pt uses lateral pinch more when tremors increased.    Stacking/unstacking blocks with focus/min-mod  cueing for tip pinch.  In standing, functional reaching laterally and across body incorporating trunk rotation and wt. Shift with min cueing for large amplitude hand movements with functional reach to encourage palmar abduction and incr ease of grasp.         OT Short Term Goals - 04/04/18 1111      OT SHORT TERM GOAL #1   Title  Pt will be independent with updated HEP--check STGs  03/29/18.    Status  On-going      OT SHORT TERM GOAL #2   Title  Pt will be able to write short paragraph with 100% legibility using strategies/AE prn.    Status  On-going   90% legibility- 03/31/18     OT SHORT TERM GOAL #3   Title  Pt will demo incr ease with use of utensils as shown by improving PPT#2 to less than 19sec.    Status  Partially Met   met after pt was cued to place elbow oon table and change hand positon 15.06 secs, not met when pt does not use strategies>34 secs 03/31/18     OT SHORT TERM GOAL #4   Title  Pt will be able to fasten/unfasten 3 buttons in less than 30sec.    Status  On-going   30.81, 46.19-03/31/18     OT SHORT TERM GOAL #5   Title  Pt will be able to pick up small objects using tip pinch at least 75% of the time with R hand.    Status  Partially Met   04/04/18:  not consistent (50% per observation in clinic without cueing).       OT Long Term Goals - 02/28/18 1156      OT LONG TERM GOAL #1   Title  Pt will verbalize understanding of strategies/AE to incr ease with ADLs (including writing, buttoning/dressing, picking up objects).--check LTGs 04/27/18.    Time  8    Period  Weeks    Status  New      OT LONG TERM GOAL #2   Title  Pt will improve coordination for ADLs as shown by improving time on 9-hole peg test by at least 4sec with R hand.    Baseline  39.47sec    Time  8    Period  Weeks    Status  New      OT LONG TERM GOAL #3   Title  Pt will improve coordination/functional reaching for ADLs as shown by improving score on box and blocks with RUE by at least 4  blocks    Baseline  RUE 38 blocks    Time  8    Period  Weeks    Status  New      OT LONG TERM GOAL #4   Title  Pt will improve R grip strength by at least 4lbs to assist in opening containers.    Baseline  33lbs    Time  8    Period  Weeks    Status  New      OT LONG TERM GOAL #5   Title  Pt will demo at least 140* R shoulder flex with -10* elbow ext for functional reaching for ADLs.    Baseline  130* with -15* elbow ext    Time  8    Period  Weeks    Status  New            Plan - 04/04/18 1106    Clinical Impression Statement  Pt is progressing towards goals with understanding of strategies for ADLs, however she can benefit from reinforcement to use consistently.  Pt continues to be inconsistent at use of tip pinch due to difficulty and reverts to lateral pinch at times.    Occupational Profile and client history currently impacting functional performance  Pt is mod I for ADLs/IADLs, but reports that she is using L nondominant UE more for ADLs due to incr difficulty with RUE.      Occupational performance deficits (Please refer to evaluation for details):  ADL's;IADL's;Leisure;Social Participation    Rehab Potential  Good    OT Frequency  2x / week    OT Duration  8 weeks    OT Treatment/Interventions  Aquatic Therapy;Paraffin;Neuromuscular education;Splinting;Patient/family education;Therapeutic activities;Functional Mobility Training;Energy conservation;Fluidtherapy;Cryotherapy;Ultrasound;DME and/or AE instruction;Manual Therapy;Passive range of motion;Cognitive remediation/compensation;Therapeutic exercise;Self-care/ADL training;Moist Heat    Plan  continue to work towards short term Audiological scientist STGs, ADL strategies, tip pinch for picking up small objects    Consulted and Agree with Plan of Care  Patient       Patient will benefit from skilled therapeutic intervention in order to improve the following deficits and impairments:  Decreased cognition, Decreased  mobility, Decreased coordination, Decreased activity tolerance, Decreased range of motion, Decreased strength, Impaired tone, Improper spinal/pelvic alignment, Impaired UE functional use, Impaired perceived functional ability, Decreased balance  Visit Diagnosis: Other symptoms and signs involving the nervous system  Other abnormalities of gait and mobility  Tremor  Other symptoms and signs involving the musculoskeletal system  Muscle weakness (generalized)  Other lack of coordination  Unsteadiness  Abnormal posture    Problem List Patient Active Problem List   Diagnosis Date Noted  . 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 Chronic nonallergic rhinitis 12/29/2016  . Mild persistent asthma, uncomplicated 27/74/1287  . 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 Gastroesophageal reflux disease 12/29/2016  . Cervical spondylosis with myelopathy and radiculopathy 11/14/2013  . Essential tremor 06/13/2013  . Cervical spondylosis without myelopathy 06/13/2013  . Breast cancer of lower-outer quadrant of left female breast [facility]) 09/10/2010    [facility] 04/04/2018, 3:27 PM  [facility] [address] [place], [place], [address] Phone: [phone]   Fax:  [phone]  Name: MAICEY BARRIENTEZ MRN: [id number] Date of Birth: [birth year]   Vianne Bulls, OTR/L [facility] [address] [place], [place]  [address] [phone] phone [phone] 04/04/18 3:27 PM

## 2018-04-04 NOTE — Therapy (Signed)
[facility] [address] [facility] [place], [place], [address] Phone: [phone]   Fax:  [phone]  Physical Therapy Treatment  Patient Details  Name: April Church MRN: [id number] Date of Birth: September 24, 1940 Referring Provider (PT): Penumalli   Encounter Date: 04/04/2018  PT End of Session - 04/04/18 2108    Visit Number  8    Number of Visits  13    Date for PT Re-Evaluation  06/07/18    Authorization Type  Medicare, BCBS supplement-10th visit progress note    PT Start Time  1151    PT Stop Time  1229    PT Time Calculation (min)  38 min    Activity Tolerance  Patient tolerated treatment well    Behavior During Therapy  [facility] for tasks assessed/performed       Past Medical History:  Diagnosis Date  . Asthma    daily inhaler  . Breast cancer ([place])    left- radiation and surgery -dx. 2011- no further tx. now- [name] , [name]  . Cyst of finger 11/2011   annular cyst right long finger  . Dental crowns present   . Dermatitis   . Frequency of urination   . GERD (gastroesophageal reflux disease)   . Hemorrhoid   . Hyperlipidemia   . Hypertension    under control, has been on med. x 2 yrs.  [name] PONV (postoperative nausea and vomiting)   . Seasonal allergies   . Tremors of nervous system    hands  . Trigger finger of right hand 11/2011   long finger    Past Surgical History:  Procedure Laterality Date  . ANTERIOR CERVICAL DECOMPRESSION/DISCECTOMY FUSION 4 LEVELS N/A 11/14/2013   Procedure: ANTERIOR CERVICAL DECOMPRESSION/DISCECTOMY FUSION 4 LEVELS;  Surgeon: Newman Pies, MD;  Location: [facility];  Service: Neurosurgery;  Laterality: N/A;  C34 C45 C56 C67 anterior cervical fusion with interbody prosthesis plating and  bonegraft  . APPENDECTOMY  age 71  . BREAST LUMPECTOMY  06/16/2009   left; SLN bx.  [name] BREAST LUMPECTOMY  07/01/2009   re-excision  . BREAST SURGERY  1999   reduction  . CATARACT  EXTRACTION, BILATERAL    . COLONOSCOPY WITH PROPOFOL N/A 12/03/2014   Procedure: COLONOSCOPY WITH PROPOFOL;  Surgeon: [name], MD;  [facility];  Service: Endoscopy;  Laterality: N/A;  . FOOT SURGERY  06/22/11   left  . KNEE ARTHROSCOPY  03/12/2005   right  . KNEE ARTHROSCOPY     left  . NASAL SINUS SURGERY     x 2  . 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 TRIGGER FINGER RELEASE  12/16/2011   Procedure: RELEASE TRIGGER FINGER/A-1 PULLEY;  Surgeon: Tennis Must, MD;  Location: [facility];  Service: Orthopedics;  Laterality: Right;  RIGHT LONG FINGER TRIGGER RELEASE & ANNULAR CYST EXCISION  . TUMOR EXCISION  age 66   right arm    There were no vitals filed for this visit.  Subjective Assessment - 04/04/18 1153    Subjective  I've been out in the rain all morning.  Nothing new, no changes.    Pertinent History  Parkinson's disease    Patient Stated Goals  Pt's goal for therapy is to be able to get back to moving better, moving more.    Currently in Pain?  No/denies  PWR [facility]) - 04/04/18 1156    PWR! exercises  Moves in standing    PWR! Up  x 20    PWR! Rock  x 20    PWR! Twist  x 20     PWR Step  x 20    Comments  Review of HEP of PWR! Moves in standing-pt return demo understanding with verbal cues only today.       Balance Exercises - 04/04/18 1216      Balance Exercises: Standing   Standing Eyes Opened  Wide (BOA);Narrow base of support (BOS);Head turns;Foam/compliant surface;5 reps   Head nods   Standing Eyes Closed  Narrow base of support (BOS);Wide (BOA);Foam/compliant surface;Head turns;5 reps   Head nods; head steady EC x 10 seconds   Stepping Strategy  Anterior;Posterior;UE support;10 reps   Review of HEP additions-pt return demo understanding   Sidestepping  3 reps   Up and down ramp with min guard/supervision   Other Standing Exercises  On  incline/decline of ramp:  EO with head turns, nods x 5 reps, then EC head steady x 10 seconds; marching in place x 10, forward step taps x 10; needs min guard/close supervision with several LOB-able to regain her balance        PT Education - 04/04/18 2108    Education Details  REview of HEP    Person(s) Educated  Patient    Methods  Explanation;Demonstration    Comprehension  Verbalized understanding;Returned demonstration       PT Short Term Goals - 03/31/18 1108      PT SHORT TERM GOAL #1   Title  Pt will be independent with HEP for improved balance, functional strength and gait.  TARGET 04/07/2018    Baseline  Needs cues for technique of PWR! Moves    Time  4    Period  Weeks    Status  Not Met      PT SHORT TERM GOAL #2   Title  Pt will improve 5x sit<>stand to less than or equal to 15 seconds for decreased fall risk.    Baseline  17.94 03/31/2018    Time  4    Period  Weeks    Status  Not Met      PT SHORT TERM GOAL #3   Title  Pt will improve TUG cognitive score to less than or equal to 10% difference of TUG score, for improved dual tasking with gait.    Time  4    Period  Weeks    Status  Not Met      PT SHORT TERM GOAL #4   Title  Pt will verbalize understanding of fall prevention in home environment.    Time  4    Period  Weeks    Status  Achieved        PT Long Term Goals - 03/09/18 1312      PT LONG TERM GOAL #1   Title  Pt will be independent with progression of HEP for improved mobility, strength, balance.  04/11/2018    Time  6    Period  Weeks    Status  New    Target Date  04/21/18      PT LONG TERM GOAL #2   Title  Pt will improve MiniBESTest score to at least 20/28 for decreased fall risk.    Time  6    Period  Weeks    Status  New    Target Date  04/21/18      PT LONG TERM GOAL #3   Title  Pt will ambulate at least 1000 ft, independently on indoor and outdoor surfaces for improved outdoor and unlevel surfaces gait.    Time  6    Period   Weeks    Status  New    Target Date  04/21/18      PT LONG TERM GOAL #4   Title  Pt will verbalize plans for ongoing community fitness upon d/c from PT.    Time  6    Period  Weeks    Status  New    Target Date  04/21/18            Plan - 04/04/18 2108    Clinical Impression Statement  Reviewed HEP, with pt performing standing PWR! Moves with much less cues today, much improved recall of those exercises.  Then focus of remainder of session was on balance activities on compliant surfaces iwth head motions, EC and EO to address reported balance and unsteadiness when pt first gets up in the morning.  Also discussed using rocking and slowed transitions to help with staiblity and safety of initially moving first thing in the morning.    Rehab Potential  Good    PT Frequency  2x / week    PT Duration  6 weeks    PT Treatment/Interventions  ADLs/Self Care Home Management;Gait training;Stair training;Functional mobility training;Therapeutic activities;Therapeutic exercise;Balance training;Neuromuscular re-education;Patient/family education    PT Next Visit Plan  Balance activities on compliant surfaces, PWR! Moves FLOW, functional strengthening and balance towards LTGs    PT Home Exercise Plan  Access Code: [email or web address]    Consulted and Agree with Plan of Care  Patient       Patient will benefit from skilled therapeutic intervention in order to improve the following deficits and impairments:  Abnormal gait, Decreased balance, Decreased coordination, Decreased mobility, Decreased strength, Difficulty walking  Visit Diagnosis: Other symptoms and signs involving the nervous system  Abnormal posture  Other abnormalities of gait and mobility     Problem List Patient Active Problem List   Diagnosis Date Noted  . 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 Chronic nonallergic rhinitis 12/29/2016  . Mild persistent asthma, uncomplicated 64/38/3779  . 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  Gastroesophageal reflux disease 12/29/2016  . Cervical spondylosis with myelopathy and radiculopathy 11/14/2013  . Essential tremor 06/13/2013  . Cervical spondylosis without myelopathy 06/13/2013  . Breast cancer of lower-outer quadrant of left female breast ([place]) 09/10/2010    Jagdeep Ancheta W. 04/04/2018, 9:12 PM  Frazier Butt., PT   [facility] [address] [place], [place], [address] Phone: [phone]   Fax:  [phone]  Name: April Church MRN: [id number] Date of Birth: 1940/11/27

## 2018-04-05 DIAGNOSIS — G250 Essential tremor: Secondary | ICD-10-CM | POA: Diagnosis not present

## 2018-04-05 DIAGNOSIS — G20 Parkinson's disease: Secondary | ICD-10-CM | POA: Diagnosis not present

## 2018-04-07 ENCOUNTER — Ambulatory Visit: Payer: Medicare Other | Admitting: Occupational Therapy

## 2018-04-07 ENCOUNTER — Encounter: Payer: Self-pay | Admitting: Physical Therapy

## 2018-04-07 ENCOUNTER — Ambulatory Visit: Payer: Medicare Other | Admitting: Physical Therapy

## 2018-04-07 DIAGNOSIS — R2689 Other abnormalities of gait and mobility: Secondary | ICD-10-CM | POA: Diagnosis not present

## 2018-04-07 DIAGNOSIS — R29818 Other symptoms and signs involving the nervous system: Secondary | ICD-10-CM | POA: Diagnosis not present

## 2018-04-07 DIAGNOSIS — R29898 Other symptoms and signs involving the musculoskeletal system: Secondary | ICD-10-CM

## 2018-04-07 DIAGNOSIS — R293 Abnormal posture: Secondary | ICD-10-CM

## 2018-04-07 DIAGNOSIS — R251 Tremor, unspecified: Secondary | ICD-10-CM | POA: Diagnosis not present

## 2018-04-07 DIAGNOSIS — R278 Other lack of coordination: Secondary | ICD-10-CM

## 2018-04-07 DIAGNOSIS — M6281 Muscle weakness (generalized): Secondary | ICD-10-CM

## 2018-04-07 DIAGNOSIS — R2681 Unsteadiness on feet: Secondary | ICD-10-CM | POA: Diagnosis not present

## 2018-04-07 NOTE — Therapy (Signed)
[facility] [address] [place], [place], [address] Phone: [phone]   Fax:  [phone]  Occupational Therapy Treatment  Patient Details  Name: April Church MRN: [id number] Date of Birth: 1940-08-18 Referring Provider (OT): Dr. Andrey Spearman   Encounter Date: 04/07/2018  OT End of Session - 04/07/18 1105    Visit Number  10    Number of Visits  17    Date for OT Re-Evaluation  05/28/18    Authorization Type  Medicare & BCBS, covered 100%     Authorization - Visit Number  10    Authorization - Number of Visits  10    OT Start Time  1103    OT Stop Time  1145    OT Time Calculation (min)  42 min    Activity Tolerance  Patient tolerated treatment well    Behavior During Therapy  [facility] for tasks assessed/performed       Past Medical History:  Diagnosis Date  . Asthma    daily inhaler  . Breast cancer ([place])    left- radiation and surgery -dx. 2011- no further tx. now- [name] , [name]  . Cyst of finger 11/2011   annular cyst right long finger  . Dental crowns present   . Dermatitis   . Frequency of urination   . GERD (gastroesophageal reflux disease)   . Hemorrhoid   . Hyperlipidemia   . Hypertension    under control, has been on med. x 2 yrs.  [name] PONV (postoperative nausea and vomiting)   . Seasonal allergies   . Tremors of nervous system    hands  . Trigger finger of right hand 11/2011   long finger    Past Surgical History:  Procedure Laterality Date  . ANTERIOR CERVICAL DECOMPRESSION/DISCECTOMY FUSION 4 LEVELS N/A 11/14/2013   Procedure: ANTERIOR CERVICAL DECOMPRESSION/DISCECTOMY FUSION 4 LEVELS;  Surgeon: Newman Pies, MD;  Location: [place] [facility];  Service: Neurosurgery;  Laterality: N/A;  C34 C45 C56 C67 anterior cervical fusion with interbody prosthesis plating and  bonegraft  . APPENDECTOMY  age 78  . BREAST LUMPECTOMY  06/16/2009   left; SLN bx.  [name] BREAST LUMPECTOMY   07/01/2009   re-excision  . BREAST SURGERY  1999   reduction  . CATARACT EXTRACTION, BILATERAL    . COLONOSCOPY WITH PROPOFOL N/A 12/03/2014   Procedure: COLONOSCOPY WITH PROPOFOL;  Surgeon: [name], MD;  [facility];  Service: Endoscopy;  Laterality: N/A;  . FOOT SURGERY  06/22/11   left  . KNEE ARTHROSCOPY  03/12/2005   right  . KNEE ARTHROSCOPY     left  . NASAL SINUS SURGERY     x 2  . 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 TRIGGER FINGER RELEASE  12/16/2011   Procedure: RELEASE TRIGGER FINGER/A-1 PULLEY;  Surgeon: Tennis Must, MD;  Location: [facility];  Service: Orthopedics;  Laterality: Right;  RIGHT LONG FINGER TRIGGER RELEASE & ANNULAR CYST EXCISION  . TUMOR EXCISION  age 78   right arm    There were no vitals filed for this visit.  Subjective Assessment - 04/07/18 1522    Pertinent History  Parkinson's disease; hx of Kidney donor, hx of L breast CA, ET, cervical spondylosis and cervical decompression/fusion surgery, hx of R 3rd digit trigger finger release  and cyst, HTN, hyperlipidemia, hx of RUE tumor excision     Patient Stated Goals  improve ability to pick up coins/pils with R hand, writing, use of utensils               Treatment:Reviewed modified quadraped PWR!up, rock and twist- pt is performing at home, pt verbalizes understanding of coordination HEP and reports performing. Handwriting activities, tracing using felt tip pen and coban followed by writing sentences with good legibility and letter size, min v.c for techniques. Dynamic step and reach to flip large playing cards with left and right UE's, min v.c for larger amplitude movements. Placing various sized pegs into semicircle with RUE, emphasis on tip pinch, min v.c             OT Short Term Goals - 04/07/18 1114      OT SHORT TERM GOAL #1   Title  Pt will be independent with updated HEP--check STGs  03/29/18.     Status  Achieved      OT SHORT TERM GOAL #2   Title  Pt will be able to write short paragraph with 100% legibility using strategies/AE prn.    Status  On-going   90% legibility- 03/31/18     OT SHORT TERM GOAL #3   Title  Pt will demo incr ease with use of utensils as shown by improving PPT#2 to less than 19sec.    Status  Partially Met   met after pt was cued to place elbow oon table and change hand positon 15.06 secs, not met when pt does not use strategies>34 secs 03/31/18     OT SHORT TERM GOAL #4   Title  Pt will be able to fasten/unfasten 3 buttons in less than 30sec.    Status  On-going   30.81, 46.19-03/31/18     OT SHORT TERM GOAL #5   Title  Pt will be able to pick up small objects using tip pinch at least 75% of the time with R hand.    Status  Partially Met   04/04/18:  not consistent (50% per observation in clinic without cueing).       OT Long Term Goals - 02/28/18 1156      OT LONG TERM GOAL #1   Title  Pt will verbalize understanding of strategies/AE to incr ease with ADLs (including writing, buttoning/dressing, picking up objects).--check LTGs 04/27/18.    Time  8    Period  Weeks    Status  New      OT LONG TERM GOAL #2   Title  Pt will improve coordination for ADLs as shown by improving time on 9-hole peg test by at least 4sec with R hand.    Baseline  39.47sec    Time  8    Period  Weeks    Status  New      OT LONG TERM GOAL #3   Title  Pt will improve coordination/functional reaching for ADLs as shown by improving score on box and blocks with RUE by at least 4 blocks    Baseline  RUE 38 blocks    Time  8    Period  Weeks    Status  New      OT LONG TERM GOAL #4   Title  Pt will improve R grip strength by at least 4lbs to assist in opening containers.    Baseline  33lbs    Time  8    Period  Weeks    Status  New      OT LONG TERM GOAL #5  Title  Pt will demo at least 140* R shoulder flex with -10* elbow ext for functional reaching for ADLs.     Baseline  130* with -15* elbow ext    Time  8    Period  Weeks    Status  New              Patient will benefit from skilled therapeutic intervention in order to improve the following deficits and impairments:     Visit Diagnosis: Other symptoms and signs involving the nervous system  Other symptoms and signs involving the musculoskeletal system  Muscle weakness (generalized)  Other lack of coordination    Problem List Patient Active Problem List   Diagnosis Date Noted  . 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 Chronic nonallergic rhinitis 12/29/2016  . Mild persistent asthma, uncomplicated 16/60/6004  . 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 Gastroesophageal reflux disease 12/29/2016  . Cervical spondylosis with myelopathy and radiculopathy 11/14/2013  . Essential tremor 06/13/2013  . Cervical spondylosis without myelopathy 06/13/2013  . Breast cancer of lower-outer quadrant of left female breast ([place]) 09/10/2010    Damyia Strider 04/07/2018, 3:25 PM  [employer] [address] [place], [place], [address] Phone: [phone]   Fax:  [phone]  Name: LUCINDY BOREL MRN: [id number] Date of Birth: 09/06/1940

## 2018-04-07 NOTE — Therapy (Signed)
[facility] [address] [place], [place], [address] Phone: [phone]   Fax:  [phone]  Physical Therapy Treatment  Patient Details  Name: April Church MRN: [id number] Date of Birth: 03-Feb-1941 Referring Provider (PT): Penumalli   Encounter Date: 04/07/2018  PT End of Session - 04/07/18 1247    Visit Number  9    Number of Visits  13    Date for PT Re-Evaluation  06/07/18    Authorization Type  Medicare, BCBS supplement-10th visit progress note    PT Start Time  1150    PT Stop Time  1231    PT Time Calculation (min)  41 min    Activity Tolerance  Patient tolerated treatment well    Behavior During Therapy  [facility] for tasks assessed/performed       Past Medical History:  Diagnosis Date  . Asthma    daily inhaler  . Breast cancer ([place])    left- radiation and surgery -dx. 2011- no further tx. now- [name] , [name]  . Cyst of finger 11/2011   annular cyst right long finger  . Dental crowns present   . Dermatitis   . Frequency of urination   . GERD (gastroesophageal reflux disease)   . Hemorrhoid   . Hyperlipidemia   . Hypertension    under control, has been on med. x 2 yrs.  [name] PONV (postoperative nausea and vomiting)   . Seasonal allergies   . Tremors of nervous system    hands  . Trigger finger of right hand 11/2011   long finger    Past Surgical History:  Procedure Laterality Date  . ANTERIOR CERVICAL DECOMPRESSION/DISCECTOMY FUSION 4 LEVELS N/A 11/14/2013   Procedure: ANTERIOR CERVICAL DECOMPRESSION/DISCECTOMY FUSION 4 LEVELS;  Surgeon: Newman Pies, MD;  Location: [facility] [facility];  Service: Neurosurgery;  Laterality: N/A;  C34 C45 C56 C67 anterior cervical fusion with interbody prosthesis plating and  bonegraft  . APPENDECTOMY  age 24  . BREAST LUMPECTOMY  06/16/2009   left; SLN bx.  [name] BREAST LUMPECTOMY  07/01/2009   re-excision  . BREAST SURGERY  1999   reduction  . CATARACT  EXTRACTION, BILATERAL    . COLONOSCOPY WITH PROPOFOL N/A 12/03/2014   Procedure: COLONOSCOPY WITH PROPOFOL;  Surgeon: [name], MD;  [facility];  Service: Endoscopy;  Laterality: N/A;  . FOOT SURGERY  06/22/11   left  . KNEE ARTHROSCOPY  03/12/2005   right  . KNEE ARTHROSCOPY     left  . NASAL SINUS SURGERY     x 2  . 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 TRIGGER FINGER RELEASE  12/16/2011   Procedure: RELEASE TRIGGER FINGER/A-1 PULLEY;  Surgeon: Tennis Must, MD;  Location: [facility];  Service: Orthopedics;  Laterality: Right;  RIGHT LONG FINGER TRIGGER RELEASE & ANNULAR CYST EXCISION  . TUMOR EXCISION  age 10   right arm    There were no vitals filed for this visit.  Subjective Assessment - 04/07/18 1236    Subjective  Denies falls or changes since last visit.  Did have  scan this week and will f/u with Dr Linus Mako.    Pertinent History  Parkinson's disease    Patient Stated Goals  Pt's goal for therapy is to be able to get back to moving better, moving more.    Currently in Pain?  No/denies  PWR [facility]) - 04/07/18 1239    PWR! exercises  Moves in standing    PWR! Twist  x 20   Performed modified quad twist at counter for rotation   PWR! Up  x 20    PWR! Rock  x 20    PWR! Twist  x20    PWR Step  x20    Basic 4 Flow  8    Comments  Pt needed visual, verbal and tactile cues today with PWR! standing.  Performed Flow with min-mod assist.  Standing in corner and performed TWIST with pt using wall as a target to promote rotation.    PWR! Step Through Forward/Back  x 20   20 step forward, 20 step back and 20 forward<>back in // bar      Balance Exercises - 04/07/18 1246      Balance Exercises: Standing   Other Standing Exercises  standing taps to 6" step x 20 intermittent UE then taps to 6", 12", 6", floor x 20 reps intermitent UE support        PT Education - 04/07/18 1250    Education  Details  Visiting [facility] exercise room to see what equipment they have for community fitness after d/c from PT    Person(s) Educated  Patient    Methods  Explanation    Comprehension  Verbalized understanding       PT Short Term Goals - 03/31/18 1108      PT SHORT TERM GOAL #1   Title  Pt will be independent with HEP for improved balance, functional strength and gait.  TARGET 04/07/2018    Baseline  Needs cues for technique of PWR! Moves    Time  4    Period  Weeks    Status  Not Met      PT SHORT TERM GOAL #2   Title  Pt will improve 5x sit<>stand to less than or equal to 15 seconds for decreased fall risk.    Baseline  17.94 03/31/2018    Time  4    Period  Weeks    Status  Not Met      PT SHORT TERM GOAL #3   Title  Pt will improve TUG cognitive score to less than or equal to 10% difference of TUG score, for improved dual tasking with gait.    Time  4    Period  Weeks    Status  Not Met      PT SHORT TERM GOAL #4   Title  Pt will verbalize understanding of fall prevention in home environment.    Time  4    Period  Weeks    Status  Achieved        PT Long Term Goals - 03/09/18 1312      PT LONG TERM GOAL #1   Title  Pt will be independent with progression of HEP for improved mobility, strength, balance.  04/11/2018    Time  6    Period  Weeks    Status  New    Target Date  04/21/18      PT LONG TERM GOAL #2   Title  Pt will improve MiniBESTest score to at least 20/28 for decreased fall risk.    Time  6    Period  Weeks    Status  New    Target Date  04/21/18      PT LONG TERM GOAL #3   Title  Pt will ambulate  at least 1000 ft, independently on indoor and outdoor surfaces for improved outdoor and unlevel surfaces gait.    Time  6    Period  Weeks    Status  New    Target Date  04/21/18      PT LONG TERM GOAL #4   Title  Pt will verbalize plans for ongoing community fitness upon d/c from PT.    Time  6    Period  Weeks    Status  New    Target Date   04/21/18            Plan - 04/07/18 1248    Clinical Impression Statement  Session focused on reviewing standing PWR! moves and Flow along with balance.  Pt appeared to have difficulty remembering technique for PWR standing from previous session.  Continue PT per POC.    Rehab Potential  Good    PT Frequency  2x / week    PT Duration  6 weeks    PT Treatment/Interventions  ADLs/Self Care Home Management;Gait training;Stair training;Functional mobility training;Therapeutic activities;Therapeutic exercise;Balance training;Neuromuscular re-education;Patient/family education    PT Next Visit Plan  10th visit progress note.  See if pt was able to visit exercise room at [facility] to look at what equipment they have available to prepare for community fitness.  Begin checking LTG's.      PT Home Exercise Plan  Access Code: [email or web address]    Consulted and Agree with Plan of Care  Patient       Patient will benefit from skilled therapeutic intervention in order to improve the following deficits and impairments:  Abnormal gait, Decreased balance, Decreased coordination, Decreased mobility, Decreased strength, Difficulty walking  Visit Diagnosis: Other symptoms and signs involving the nervous system  Abnormal posture  Other abnormalities of gait and mobility     Problem List Patient Active Problem List   Diagnosis Date Noted  . 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 Chronic nonallergic rhinitis 12/29/2016  . Mild persistent asthma, uncomplicated 89/48/3475  . 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 Gastroesophageal reflux disease 12/29/2016  . Cervical spondylosis with myelopathy and radiculopathy 11/14/2013  . Essential tremor 06/13/2013  . Cervical spondylosis without myelopathy 06/13/2013  . Breast cancer of lower-outer quadrant of left female breast ([place]) 09/10/2010    Narda Bonds, PTA [facility] 04/07/18 12:52 PM Phone:  [phone] Fax: [facility] [address] [place], [place], [address] Phone: [phone]   Fax:  [phone]  Name: ELVIRA LANGSTON MRN: [id number] Date of Birth: September 24, 1940

## 2018-04-11 ENCOUNTER — Encounter: Payer: Self-pay | Admitting: Physical Therapy

## 2018-04-11 ENCOUNTER — Ambulatory Visit: Payer: Medicare Other | Admitting: Occupational Therapy

## 2018-04-11 ENCOUNTER — Ambulatory Visit: Payer: Medicare Other | Admitting: Physical Therapy

## 2018-04-11 ENCOUNTER — Encounter: Payer: Self-pay | Admitting: Occupational Therapy

## 2018-04-11 DIAGNOSIS — M6281 Muscle weakness (generalized): Secondary | ICD-10-CM

## 2018-04-11 DIAGNOSIS — R2681 Unsteadiness on feet: Secondary | ICD-10-CM | POA: Diagnosis not present

## 2018-04-11 DIAGNOSIS — R278 Other lack of coordination: Secondary | ICD-10-CM | POA: Diagnosis not present

## 2018-04-11 DIAGNOSIS — R251 Tremor, unspecified: Secondary | ICD-10-CM

## 2018-04-11 DIAGNOSIS — R29818 Other symptoms and signs involving the nervous system: Secondary | ICD-10-CM

## 2018-04-11 DIAGNOSIS — R29898 Other symptoms and signs involving the musculoskeletal system: Secondary | ICD-10-CM

## 2018-04-11 DIAGNOSIS — R2689 Other abnormalities of gait and mobility: Secondary | ICD-10-CM

## 2018-04-11 DIAGNOSIS — R293 Abnormal posture: Secondary | ICD-10-CM

## 2018-04-11 NOTE — Therapy (Signed)
[facility] [address] [place], [place], [address] Phone: [phone]   Fax:  [phone]  Occupational Therapy Treatment  Patient Details  Name: April Church MRN: [id number] Date of Birth: 1940/12/22 Referring Provider (OT): Dr. Andrey Spearman   Encounter Date: 04/11/2018  OT End of Session - 04/11/18 1108    Visit Number  11    Number of Visits  17    Date for OT Re-Evaluation  05/28/18    Authorization Type  Medicare & BCBS, covered 100%     Authorization - Visit Number  11    Authorization - Number of Visits  20    OT Start Time  5277    OT Stop Time  1145    OT Time Calculation (min)  40 min    Activity Tolerance  Patient tolerated treatment well    Behavior During Therapy  [facility] for tasks assessed/performed       Past Medical History:  Diagnosis Date  . Asthma    daily inhaler  . Breast cancer ([place])    left- radiation and surgery -dx. 2011- no further tx. now- [name] , [name]  . Cyst of finger 11/2011   annular cyst right long finger  . Dental crowns present   . Dermatitis   . Frequency of urination   . GERD (gastroesophageal reflux disease)   . Hemorrhoid   . Hyperlipidemia   . Hypertension    under control, has been on med. x 2 yrs.  [name] PONV (postoperative nausea and vomiting)   . Seasonal allergies   . Tremors of nervous system    hands  . Trigger finger of right hand 11/2011   long finger    Past Surgical History:  Procedure Laterality Date  . ANTERIOR CERVICAL DECOMPRESSION/DISCECTOMY FUSION 4 LEVELS N/A 11/14/2013   Procedure: ANTERIOR CERVICAL DECOMPRESSION/DISCECTOMY FUSION 4 LEVELS;  Surgeon: Newman Pies, MD;  Location: [facility];  Service: Neurosurgery;  Laterality: N/A;  C34 C45 C56 C67 anterior cervical fusion with interbody prosthesis plating and  bonegraft  . APPENDECTOMY  age 21  . BREAST LUMPECTOMY  06/16/2009   left; SLN bx.  [name] BREAST LUMPECTOMY   07/01/2009   re-excision  . BREAST SURGERY  1999   reduction  . CATARACT EXTRACTION, BILATERAL    . COLONOSCOPY WITH PROPOFOL N/A 12/03/2014   Procedure: COLONOSCOPY WITH PROPOFOL;  Surgeon: [name], MD;  Location: [facility];  Service: Endoscopy;  Laterality: N/A;  . FOOT SURGERY  06/22/11   left  . KNEE ARTHROSCOPY  03/12/2005   right  . KNEE ARTHROSCOPY     left  . NASAL SINUS SURGERY     x 2  . 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 TRIGGER FINGER RELEASE  12/16/2011   Procedure: RELEASE TRIGGER FINGER/A-1 PULLEY;  Surgeon: Tennis Must, MD;  Location: [facility];  Service: Orthopedics;  Laterality: Right;  RIGHT LONG FINGER TRIGGER RELEASE & ANNULAR CYST EXCISION  . TUMOR EXCISION  age 37   right arm    There were no vitals filed for this visit.  Subjective Assessment - 04/11/18 1107    Subjective   Pt goes back in a week for ?testing.  Pt reports incr ease with eating soup with foam on spoon.      Pertinent History  Parkinson's disease; hx of Kidney donor, hx of L breast CA, ET, cervical spondylosis and cervical  decompression/fusion surgery, hx of R 3rd digit trigger finger release  and cyst, HTN, hyperlipidemia, hx of RUE tumor excision     Patient Stated Goals  improve ability to pick up coins/pils with R hand, writing, use of utensils    Currently in Pain?  No/denies       Flipping cards with R hand with emphasis on thumb opposition with grasping card, dealing cards with mod difficulty with R hand with emphasis/cues for thumb movement, Picking up coins with R hand (dragging off edge) with cueing/emphasis on tip pinch then manipulating in hand with min-mod cueing for tip pinch prior to placing in coin bank.    Began checking remaining goals and discussing progress--see goals section below.  Fastening/unfastening buttons with improvement noted--see goal section below.  Reviewed strategies for ADLs and pt verbalized  understanding.        OT Education - 04/11/18 1132    Education Details  Yellow putty HEP-see pt instructions    Person(s) Educated  Patient    Methods  Explanation;Verbal cues;Demonstration;Handout    Comprehension  Verbalized understanding;Returned demonstration;Verbal cues required       OT Short Term Goals - 04/11/18 1137      OT SHORT TERM GOAL #1   Title  Pt will be independent with updated HEP--check STGs  03/29/18.    Status  Achieved      OT SHORT TERM GOAL #2   Title  Pt will be able to write short paragraph with 100% legibility using strategies/AE prn.    Status  On-going   90% legibility- 03/31/18     OT SHORT TERM GOAL #3   Title  Pt will demo incr ease with use of utensils as shown by improving PPT#2 to less than 19sec.    Status  Partially Met   met after pt was cued to place elbow oon table and change hand positon 15.06 secs, not met when pt does not use strategies>34 secs 03/31/18     OT SHORT TERM GOAL #4   Title  Pt will be able to fasten/unfasten 3 buttons in less than 30sec.    Status  Achieved   30.81, 46.19-03/31/18.  04/11/18:  21.94sec     OT SHORT TERM GOAL #5   Title  Pt will be able to pick up small objects using tip pinch at least 75% of the time with R hand.    Status  Partially Met   04/04/18:  not consistent (50% per observation in clinic without cueing).       OT Long Term Goals - 04/11/18 1135      OT LONG TERM GOAL #1   Title  Pt will verbalize understanding of strategies/AE to incr ease with ADLs (including writing, buttoning/dressing, picking up objects).--check LTGs 04/27/18.    Time  8    Period  Weeks    Status  Achieved      OT LONG TERM GOAL #2   Title  Pt will improve coordination for ADLs as shown by improving time on 9-hole peg test by at least 4sec with R hand.    Baseline  39.47sec    Time  8    Period  Weeks    Status  Not Met   04/11/18:  53.07, 48.47sec     OT LONG TERM GOAL #3   Title  Pt will improve  coordination/functional reaching for ADLs as shown by improving score on box and blocks with RUE by at least 4 blocks  Baseline  RUE 38 blocks    Time  8    Period  Weeks    Status  New      OT LONG TERM GOAL #4   Title  Pt will improve R grip strength by at least 4lbs to assist in opening containers.    Baseline  33lbs    Time  8    Period  Weeks    Status  Not Met   04/11/18:  33lbs     OT LONG TERM GOAL #5   Title  Pt will demo at least 140* R shoulder flex with -10* elbow ext for functional reaching for ADLs.    Baseline  130* with -15* elbow ext    Time  8    Period  Weeks    Status  Partially Met   04/11/18:  135* with -10* elbow ext           Plan - 04/11/18 1109    Clinical Impression Statement  Pt demo improved ability to fasten buttons and improved RUE ROM.  Pt also demo improved tip pinch today.      Occupational Profile and client history currently impacting functional performance  Pt is mod I for ADLs/IADLs, but reports that she is using L nondominant UE more for ADLs due to incr difficulty with RUE.      Occupational performance deficits (Please refer to evaluation for details):  ADL's;IADL's;Leisure;Social Participation    Rehab Potential  Good    OT Frequency  2x / week    OT Duration  8 weeks    OT Treatment/Interventions  Aquatic Therapy;Paraffin;Neuromuscular education;Splinting;Patient/family education;Therapeutic activities;Functional Mobility Training;Energy conservation;Fluidtherapy;Cryotherapy;Ultrasound;DME and/or AE instruction;Manual Therapy;Passive range of motion;Cognitive remediation/compensation;Therapeutic exercise;Self-care/ADL training;Moist Heat    Plan  check remaining goals, anticipate d/c (writing, box and blocks)    Consulted and Agree with Plan of Care  Patient       Patient will benefit from skilled therapeutic intervention in order to improve the following deficits and impairments:  Decreased cognition, Decreased mobility, Decreased  coordination, Decreased activity tolerance, Decreased range of motion, Decreased strength, Impaired tone, Improper spinal/pelvic alignment, Impaired UE functional use, Impaired perceived functional ability, Decreased balance  Visit Diagnosis: Other symptoms and signs involving the nervous system  Abnormal posture  Other abnormalities of gait and mobility  Other symptoms and signs involving the musculoskeletal system  Other lack of coordination  Tremor  Muscle weakness (generalized)  Unsteadiness    Problem List Patient Active Problem List   Diagnosis Date Noted  . 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 Chronic nonallergic rhinitis 12/29/2016  . Mild persistent asthma, uncomplicated 63/78/5885  . 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 Gastroesophageal reflux disease 12/29/2016  . Cervical spondylosis with myelopathy and radiculopathy 11/14/2013  . Essential tremor 06/13/2013  . Cervical spondylosis without myelopathy 06/13/2013  . Breast cancer of lower-outer quadrant of left female breast [facility]) 09/10/2010    [facility] 04/11/2018, 3:47 PM  [facility] [address] [place], [place], [address] Phone: [phone]   Fax:  [phone]  Name: April Church MRN: [id number] Date of Birth: February 11, 1941   Vianne Bulls, OTR/L [facility] [address] [place],   [address] [phone] phone [phone] 04/11/18 3:47 PM

## 2018-04-11 NOTE — Patient Instructions (Signed)

## 2018-04-11 NOTE — Therapy (Signed)
[facility] [address] [place], [place], [address] Phone: [phone]   Fax:  [phone]  Physical Therapy Treatment  Patient Details  Name: April Church MRN: [id number] Date of Birth: 1940-04-14 Referring Provider (PT): Penumalli   Encounter Date: 04/11/2018  PT End of Session - 04/11/18 2049    Visit Number  10    Number of Visits  13    Date for PT Re-Evaluation  06/07/18    Authorization Type  Medicare, BCBS supplement-10th visit progress note    PT Start Time  1148    PT Stop Time  1231    PT Time Calculation (min)  43 min    Activity Tolerance  Patient tolerated treatment well    Behavior During Therapy  [facility] for tasks assessed/performed       Past Medical History:  Diagnosis Date  . Asthma    daily inhaler  . Breast cancer ([place])    left- radiation and surgery -dx. 2011- no further tx. now- [name] , [name]  . Cyst of finger 11/2011   annular cyst right long finger  . Dental crowns present   . Dermatitis   . Frequency of urination   . GERD (gastroesophageal reflux disease)   . Hemorrhoid   . Hyperlipidemia   . Hypertension    under control, has been on med. x 2 yrs.  [name] PONV (postoperative nausea and vomiting)   . Seasonal allergies   . Tremors of nervous system    hands  . Trigger finger of right hand 11/2011   long finger    Past Surgical History:  Procedure Laterality Date  . ANTERIOR CERVICAL DECOMPRESSION/DISCECTOMY FUSION 4 LEVELS N/A 11/14/2013   Procedure: ANTERIOR CERVICAL DECOMPRESSION/DISCECTOMY FUSION 4 LEVELS;  Surgeon: Newman Pies, MD;  Location: [facility];  Service: Neurosurgery;  Laterality: N/A;  C34 C45 C56 C67 anterior cervical fusion with interbody prosthesis plating and  bonegraft  . APPENDECTOMY  age 78  . BREAST LUMPECTOMY  06/16/2009   left; SLN bx.  [name] BREAST LUMPECTOMY  07/01/2009   re-excision  . BREAST SURGERY  1999   reduction  . CATARACT  EXTRACTION, BILATERAL    . COLONOSCOPY WITH PROPOFOL N/A 12/03/2014   Procedure: COLONOSCOPY WITH PROPOFOL;  Surgeon: [name], MD;  Location: [facility];  Service: Endoscopy;  Laterality: N/A;  . FOOT SURGERY  06/22/11   left  . KNEE ARTHROSCOPY  03/12/2005   right  . KNEE ARTHROSCOPY     left  . NASAL SINUS SURGERY     x 2  . 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 TRIGGER FINGER RELEASE  12/16/2011   Procedure: RELEASE TRIGGER FINGER/A-1 PULLEY;  Surgeon: Tennis Must, MD;  Location: [facility];  Service: Orthopedics;  Laterality: Right;  RIGHT LONG FINGER TRIGGER RELEASE & ANNULAR CYST EXCISION  . TUMOR EXCISION  age 78   right arm    There were no vitals filed for this visit.  Subjective Assessment - 04/11/18 1151    Subjective  Want to continue to work on balance.    Pertinent History  Parkinson's disease    Patient Stated Goals  Pt's goal for therapy is to be able to get back to moving better, moving more.    Currently in Pain?  No/denies  [facility] Treatment/Exercise - 04/11/18 0001      Ambulation/Gait   Ambulation/Gait  Yes    Ambulation/Gait Assistance  6: Modified independent (Device/Increase time)    Ambulation/Gait Assistance Details  Indoor gait this visit, as pt requests not to go outdoors due to cold, damp air    Ambulation Distance (Feet)  1100 Feet    Assistive device  None    Gait Pattern  Step-through pattern;Decreased arm swing - right;Narrow base of support;Decreased stride length    Ambulation Surface  Level;Indoor    Gait Comments  No overt LOB noted with gait.  Discussed walking for exercise and pt agreeable to walking in neighborhood for exercise.      High Level Balance   High Level Balance Comments  MiniBESTest score:  20/28 (see note for full details).  Reviewed forward, back, side step strategies x 5 reps each, with cues for deliberate step,  education of rationale of these exercises for step strategy for balance.      Self-Care   Self-Care  Other Self-Care Comments    Other Self-Care Comments   Discussed options for continued community community fitness upon d/c from PT:  continue PT HEP, walking for exercises, stationary bike at [employer].  Provided patient with (and discussed) optimal PD fitness upon d/c from PT.               PT Education - 04/11/18 2049    Education Details  Optimal fitness for people with PD post d/c from PT    Person(s) Educated  Patient    Methods  Explanation;Handout    Comprehension  Verbalized understanding       PT Short Term Goals - 03/31/18 1108      PT SHORT TERM GOAL #1   Title  Pt will be independent with HEP for improved balance, functional strength and gait.  TARGET 04/07/2018    Baseline  Needs cues for technique of PWR! Moves    Time  4    Period  Weeks    Status  Not Met      PT SHORT TERM GOAL #2   Title  Pt will improve 5x sit<>stand to less than or equal to 15 seconds for decreased fall risk.    Baseline  17.94 03/31/2018    Time  4    Period  Weeks    Status  Not Met      PT SHORT TERM GOAL #3   Title  Pt will improve TUG cognitive score to less than or equal to 10% difference of TUG score, for improved dual tasking with gait.    Time  4    Period  Weeks    Status  Not Met      PT SHORT TERM GOAL #4   Title  Pt will verbalize understanding of fall prevention in home environment.    Time  4    Period  Weeks    Status  Achieved        PT Long Term Goals - 04/11/18 2050      PT LONG TERM GOAL #1   Title  Pt will be independent with progression of HEP for improved mobility, strength, balance.  04/11/2018    Time  6    Period  Weeks    Status  New      PT LONG TERM GOAL #2   Title  Pt will improve MiniBESTest score to at least 20/28 for decreased fall risk.  Baseline  20/28 04/11/2018    Time  6    Period  Weeks    Status  Achieved      PT LONG TERM  GOAL #3   Title  Pt will ambulate at least 1000 ft, independently on indoor and outdoor surfaces for improved outdoor and unlevel surfaces gait.    Baseline  indoor surfaces (pt requests not outdoors due to weathera0    Time  6    Period  Weeks    Status  Partially Met      PT LONG TERM GOAL #4   Title  Pt will verbalize plans for ongoing community fitness upon d/c from PT.    Time  6    Period  Weeks    Status  Achieved            Plan - 04/11/18 2051    Clinical Impression Statement  10th Visit progress note:  (covering dates 03/08/2018-04/11/2018).  MiniBESTest score 20/28 (improved from 15/28), TUG 12.72 sec, TUG cognitive 14.84 sec (>10% difference indicates difficulty with dual tasking).  5x sit<>stand measured 03/31/2018 17.94 seconds (improved from 18.63 sec at eval).  Began assessing LTGs this visit, with pt meeting LTG 2 and 4; LTG 3 partially met for distance.  Pt is progressing towards remaining LTG for HEP and will likely be appropriate for d/c next visit.    Rehab Potential  Good    PT Frequency  2x / week    PT Duration  6 weeks    PT Treatment/Interventions  ADLs/Self Care Home Management;Gait training;Stair training;Functional mobility training;Therapeutic activities;Therapeutic exercise;Balance training;Neuromuscular re-education;Patient/family education    PT Next Visit Plan  perform 5x sit<>stand, gait velocity; set up screen for 6-9 months; check HEP goal; plan for d/c next visit.    PT Home Exercise Plan  Access Code: [email or web address]    Consulted and Agree with Plan of Care  Patient       Patient will benefit from skilled therapeutic intervention in order to improve the following deficits and impairments:  Abnormal gait, Decreased balance, Decreased coordination, Decreased mobility, Decreased strength, Difficulty walking  Visit Diagnosis: Other abnormalities of gait and mobility  Other symptoms and signs involving the nervous system     Problem List Patient Active  Problem List   Diagnosis Date Noted  . 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 Chronic nonallergic rhinitis 12/29/2016  . Mild persistent asthma, uncomplicated 37/54/3606  . 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 Gastroesophageal reflux disease 12/29/2016  . Cervical spondylosis with myelopathy and radiculopathy 11/14/2013  . Essential tremor 06/13/2013  . Cervical spondylosis without myelopathy 06/13/2013  . Breast cancer of lower-outer quadrant of left female breast ([place]) 09/10/2010    April Wetherell W. 04/11/2018, 8:55 PM Frazier Butt., PT  [facility] [address] [place], [place], [address] Phone: [phone]   Fax:  [phone]  Name: JORDYN HOFACKER MRN: [id number] Date of Birth: 10-25-1940

## 2018-04-11 NOTE — Patient Instructions (Signed)
   Extension (Assistive Putty)   Roll putty back and forth, being sure to use all fingertips. Repeat 3 times. Do 1-2 sessions per day.  Then pinch as below.   Palmar Pinch Strengthening (Resistive Putty)   Pinch putty between thumb and each fingertip in turn after rolling out     MP Flexion (Resistive Putty)   Bending only at large knuckles, press putty down against thumb. Keep fingertips straight. Repeat 10 times. Do 1-2 sessions per day. FINGERS: Extension (Putty)    Open hand and fingers to flatten putty. 5-10 reps per set

## 2018-04-13 ENCOUNTER — Ambulatory Visit: Payer: Medicare Other | Admitting: Occupational Therapy

## 2018-04-13 ENCOUNTER — Ambulatory Visit: Payer: Medicare Other | Admitting: Physical Therapy

## 2018-04-13 ENCOUNTER — Encounter: Payer: Self-pay | Admitting: Occupational Therapy

## 2018-04-13 ENCOUNTER — Encounter: Payer: Self-pay | Admitting: Physical Therapy

## 2018-04-13 DIAGNOSIS — R251 Tremor, unspecified: Secondary | ICD-10-CM

## 2018-04-13 DIAGNOSIS — R2689 Other abnormalities of gait and mobility: Secondary | ICD-10-CM

## 2018-04-13 DIAGNOSIS — R29818 Other symptoms and signs involving the nervous system: Secondary | ICD-10-CM

## 2018-04-13 DIAGNOSIS — R2681 Unsteadiness on feet: Secondary | ICD-10-CM

## 2018-04-13 DIAGNOSIS — R29898 Other symptoms and signs involving the musculoskeletal system: Secondary | ICD-10-CM | POA: Diagnosis not present

## 2018-04-13 DIAGNOSIS — R278 Other lack of coordination: Secondary | ICD-10-CM

## 2018-04-13 DIAGNOSIS — R293 Abnormal posture: Secondary | ICD-10-CM

## 2018-04-13 NOTE — Therapy (Signed)
[facility] [address] [place], [place], [address] Phone: [phone]   Fax:  [phone]  Physical Therapy Treatment  Patient Details  Name: April Church MRN: [id number] Date of Birth: Mar 21, 1940 Referring Provider (PT): Penumalli   Encounter Date: 04/13/2018  PT End of Session - 04/13/18 1524    Visit Number  11    Number of Visits  13    Date for PT Re-Evaluation  06/07/18    Authorization Type  Medicare, BCBS supplement-10th visit progress note    PT Start Time  1105    PT Stop Time  1130    PT Time Calculation (min)  25 min    Activity Tolerance  Patient tolerated treatment well    Behavior During Therapy  [facility] for tasks assessed/performed       Past Medical History:  Diagnosis Date  . Asthma    daily inhaler  . Breast cancer ([place])    left- radiation and surgery -dx. 2011- no further tx. now- [name] , [name]  . Cyst of finger 11/2011   annular cyst right long finger  . Dental crowns present   . Dermatitis   . Frequency of urination   . GERD (gastroesophageal reflux disease)   . Hemorrhoid   . Hyperlipidemia   . Hypertension    under control, has been on med. x 2 yrs.  [name] PONV (postoperative nausea and vomiting)   . Seasonal allergies   . Tremors of nervous system    hands  . Trigger finger of right hand 11/2011   long finger    Past Surgical History:  Procedure Laterality Date  . ANTERIOR CERVICAL DECOMPRESSION/DISCECTOMY FUSION 4 LEVELS N/A 11/14/2013   Procedure: ANTERIOR CERVICAL DECOMPRESSION/DISCECTOMY FUSION 4 LEVELS;  Surgeon: Newman Pies, MD;  Location: [facility];  Service: Neurosurgery;  Laterality: N/A;  C34 C45 C56 C67 anterior cervical fusion with interbody prosthesis plating and  bonegraft  . APPENDECTOMY  age 2  . BREAST LUMPECTOMY  06/16/2009   left; SLN bx.  [name] BREAST LUMPECTOMY  07/01/2009   re-excision  . BREAST SURGERY  1999   reduction  . CATARACT  EXTRACTION, BILATERAL    . COLONOSCOPY WITH PROPOFOL N/A 12/03/2014   Procedure: COLONOSCOPY WITH PROPOFOL;  Surgeon: [name], MD;  Location: [facility];  Service: Endoscopy;  Laterality: N/A;  . FOOT SURGERY  06/22/11   left  . KNEE ARTHROSCOPY  03/12/2005   right  . KNEE ARTHROSCOPY     left  . NASAL SINUS SURGERY     x 2  . 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 TRIGGER FINGER RELEASE  12/16/2011   Procedure: RELEASE TRIGGER FINGER/A-1 PULLEY;  Surgeon: Tennis Must, MD;  Location: [facility];  Service: Orthopedics;  Laterality: Right;  RIGHT LONG FINGER TRIGGER RELEASE & ANNULAR CYST EXCISION  . TUMOR EXCISION  age 78   right arm    There were no vitals filed for this visit.  Subjective Assessment - 04/13/18 1107    Subjective  No changes, no pain.    Pertinent History  Parkinson's disease    Patient Stated Goals  Pt's goal for therapy is to be able to get back to moving better, moving more.    Currently in Pain?  No/denies                       [facility]  Adult PT Treatment/Exercise - 04/13/18 0001      Transfers   Transfers  Sit to Stand;Stand to Sit    Sit to Stand  6: Modified independent (Device/Increase time);5: Supervision;Without upper extremity assist;From chair/3-in-1    Five time sit to stand comments   15.38    Stand to Sit  6: Modified independent (Device/Increase time);5: Supervision;Without upper extremity assist;To chair/3-in-1      Ambulation/Gait   Ambulation/Gait  Yes    Ambulation/Gait Assistance  6: Modified independent (Device/Increase time)    Assistive device  None    Gait Pattern  Step-through pattern;Decreased arm swing - right;Narrow base of support;Decreased stride length    Ambulation Surface  Level;Indoor    Gait velocity  9 sec = 3.64 ft/sec      Self-Care   Self-Care  Other Self-Care Comments    Other Self-Care Comments   REviewed community fitness options post-d/c;  discussed progress towards goals, plans for d/c today. Discussed return screen in 6-9 months or sooner if symptoms warrant.      Neuro Re-ed    Neuro Re-ed Details   Review of HEP, standing PWR! Moves:  PWR! Up x 10, PWR! Rock x 10 reps each side, PWR! Twist x 10 reps, PWR! Step to side, 2 sets x 10; then at counter back step and weightshift x 10 reps, forward step and weightshift x 10 reps.  Pt prefers to use UE support at counter and instructed to continue to do so; continue to do HEP daily.             PT Education - 04/13/18 1523    Education Details  See self-care info    Person(s) Educated  Patient    Methods  Explanation    Comprehension  Verbalized understanding       PT Short Term Goals - 03/31/18 1108      PT SHORT TERM GOAL #1   Title  Pt will be independent with HEP for improved balance, functional strength and gait.  TARGET 04/07/2018    Baseline  Needs cues for technique of PWR! Moves    Time  4    Period  Weeks    Status  Not Met      PT SHORT TERM GOAL #2   Title  Pt will improve 5x sit<>stand to less than or equal to 15 seconds for decreased fall risk.    Baseline  17.94 03/31/2018    Time  4    Period  Weeks    Status  Not Met      PT SHORT TERM GOAL #3   Title  Pt will improve TUG cognitive score to less than or equal to 10% difference of TUG score, for improved dual tasking with gait.    Time  4    Period  Weeks    Status  Not Met      PT SHORT TERM GOAL #4   Title  Pt will verbalize understanding of fall prevention in home environment.    Time  4    Period  Weeks    Status  Achieved        PT Long Term Goals - 04/13/18 1524      PT LONG TERM GOAL #1   Title  Pt will be independent with progression of HEP for improved mobility, strength, balance.  04/11/2018    Time  6    Period  Weeks    Status  Achieved  PT LONG TERM GOAL #2   Title  Pt will improve MiniBESTest score to at least 20/28 for decreased fall risk.    Baseline  20/28  04/11/2018    Time  6    Period  Weeks    Status  Achieved      PT LONG TERM GOAL #3   Title  Pt will ambulate at least 1000 ft, independently on indoor and outdoor surfaces for improved outdoor and unlevel surfaces gait.    Baseline  indoor surfaces (pt requests not outdoors due to weathera0    Time  6    Period  Weeks    Status  Partially Met      PT LONG TERM GOAL #4   Title  Pt will verbalize plans for ongoing community fitness upon d/c from PT.    Time  6    Period  Weeks    Status  Achieved            Plan - 04/13/18 1524    Clinical Impression Statement  Pt has met LTG 1 for independence with HEP.  She has improved overall functional mobility measures, including MiniBESTest and 5x sit<>stand and gait velocity scores since eval.  She verbalizes plans for continued community fitness and is appropriate for d/c from PT this visit.      Rehab Potential  Good    PT Frequency  2x / week    PT Duration  6 weeks    PT Treatment/Interventions  ADLs/Self Care Home Management;Gait training;Stair training;Functional mobility training;Therapeutic activities;Therapeutic exercise;Balance training;Neuromuscular re-education;Patient/family education    PT Next Visit Plan  d/c PT this visit; plan for return screens in 6-9 months    PT Home Exercise Plan  Access Code: [email or web address]    Consulted and Agree with Plan of Care  Patient       Patient will benefit from skilled therapeutic intervention in order to improve the following deficits and impairments:  Abnormal gait, Decreased balance, Decreased coordination, Decreased mobility, Decreased strength, Difficulty walking  Visit Diagnosis: Other abnormalities of gait and mobility     Problem List Patient Active Problem List   Diagnosis Date Noted  . 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 Chronic nonallergic rhinitis 12/29/2016  . Mild persistent asthma, uncomplicated 53/29/9242  . 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  Gastroesophageal reflux disease 12/29/2016  . Cervical spondylosis with myelopathy and radiculopathy 11/14/2013  . Essential tremor 06/13/2013  . Cervical spondylosis without myelopathy 06/13/2013  . Breast cancer of lower-outer quadrant of left female breast ([place]) 09/10/2010    Jamel Holzmann W. 04/13/2018, 3:26 PM Frazier Butt., PT  [facility] [address] [place], [place], [address] Phone: [phone]   Fax:  [phone]  Name: April Church MRN: [id number] Date of Birth: [birth year]  PHYSICAL THERAPY DISCHARGE SUMMARY  Visits from Start of Care: 11  Current functional level related to goals / functional outcomes: PT Long Term Goals - 04/13/18 1524      PT LONG TERM GOAL #1   Title  Pt will be independent with progression of HEP for improved mobility, strength, balance.  04/11/2018    Time  6    Period  Weeks    Status  Achieved      PT LONG TERM GOAL #2   Title  Pt will improve MiniBESTest score to at least 20/28 for decreased fall risk.    Baseline  20/28 04/11/2018    Time  6  Period  Weeks    Status  Achieved      PT LONG TERM GOAL #3   Title  Pt will ambulate at least 1000 ft, independently on indoor and outdoor surfaces for improved outdoor and unlevel surfaces gait.    Baseline  indoor surfaces (pt requests not outdoors due to weathera0    Time  6    Period  Weeks    Status  Partially Met      PT LONG TERM GOAL #4   Title  Pt will verbalize plans for ongoing community fitness upon d/c from PT.    Time  6    Period  Weeks    Status  Achieved      Pt has met 3 of 4 LTGs.   Remaining deficits: Tremors, balance   Education / Equipment: HEP, fall prevention, community fitness  Plan: Patient agrees to discharge.  Patient goals were partially met. Patient is being discharged due to being pleased with the current functional level.  ?????Recommend return PT screens in 6-9 months.      Mady Haagensen, PT 04/13/18 3:28 PM Phone: [phone] Fax: [phone]

## 2018-04-13 NOTE — Therapy (Signed)
[facility] [address] [place], [place], [address] Phone: [phone]   Fax:  [phone]  Occupational Therapy Treatment  Patient Details  Name: April Church MRN: [id number] Date of Birth: 1940/07/15 Referring Provider (OT): Dr. Andrey Spearman   Encounter Date: 04/13/2018  OT End of Session - 04/13/18 1023    Visit Number  12    Number of Visits  17    Date for OT Re-Evaluation  05/28/18    Authorization Type  Medicare & BCBS, covered 100%     Authorization - Visit Number  12    Authorization - Number of Visits  20    OT Start Time  1020    OT Stop Time  1100    OT Time Calculation (min)  40 min    Activity Tolerance  Patient tolerated treatment well    Behavior During Therapy  [facility] for tasks assessed/performed       Past Medical History:  Diagnosis Date  . Asthma    daily inhaler  . Breast cancer ([place])    left- radiation and surgery -dx. 2011- no further tx. now- [name] , [name]  . Cyst of finger 11/2011   annular cyst right long finger  . Dental crowns present   . Dermatitis   . Frequency of urination   . GERD (gastroesophageal reflux disease)   . Hemorrhoid   . Hyperlipidemia   . Hypertension    under control, has been on med. x 2 yrs.  [name] PONV (postoperative nausea and vomiting)   . Seasonal allergies   . Tremors of nervous system    hands  . Trigger finger of right hand 11/2011   long finger    Past Surgical History:  Procedure Laterality Date  . ANTERIOR CERVICAL DECOMPRESSION/DISCECTOMY FUSION 4 LEVELS N/A 11/14/2013   Procedure: ANTERIOR CERVICAL DECOMPRESSION/DISCECTOMY FUSION 4 LEVELS;  Surgeon: Newman Pies, MD;  Location: [facility];  Service: Neurosurgery;  Laterality: N/A;  C34 C45 C56 C67 anterior cervical fusion with interbody prosthesis plating and  bonegraft  . APPENDECTOMY  age 54  . BREAST LUMPECTOMY  06/16/2009   left; SLN bx.  [name] BREAST LUMPECTOMY   07/01/2009   re-excision  . BREAST SURGERY  1999   reduction  . CATARACT EXTRACTION, BILATERAL    . COLONOSCOPY WITH PROPOFOL N/A 12/03/2014   Procedure: COLONOSCOPY WITH PROPOFOL;  Surgeon: [name], MD;  [facility];  Service: Endoscopy;  Laterality: N/A;  . FOOT SURGERY  06/22/11   left  . KNEE ARTHROSCOPY  03/12/2005   right  . KNEE ARTHROSCOPY     left  . NASAL SINUS SURGERY     x 2  . 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 TRIGGER FINGER RELEASE  12/16/2011   Procedure: RELEASE TRIGGER FINGER/A-1 PULLEY;  Surgeon: Tennis Must, MD;  Location: [facility];  Service: Orthopedics;  Laterality: Right;  RIGHT LONG FINGER TRIGGER RELEASE & ANNULAR CYST EXCISION  . TUMOR EXCISION  age 59   right arm    There were no vitals filed for this visit.  Subjective Assessment - 04/13/18 1021    Pertinent History  Parkinson's disease; hx of Kidney donor, hx of L breast CA, ET, cervical spondylosis and cervical decompression/fusion surgery, hx of R 3rd digit trigger finger release  and cyst, HTN, hyperlipidemia, hx of RUE tumor excision     Patient Stated Goals  improve ability to pick up coins/pils with R hand, writing, use of utensils    Currently in Pain?  No/denies          Checked remaining goals and discussed progress--see goals section below.  Reviewed yellow putty HEP and pt returned demo with min cueing.  Writing with the pencil grip on felt pen with good legibility, incr time.  Min decr in legibility the more she wrote.  in standing, functional reaching at mid-level to place small pegs in vertical pegboard to copy design (for cognitive component) with min cueing for tip pinch with R hand coordination.    OT Education - 04/13/18 1218    Education Details  [employer]; Recommendations to decr risk of future complication; Follow-up recommendations    Person(s) Educated  Patient    Methods   Explanation;Verbal cues;Demonstration;Handout    Comprehension  Verbalized understanding;Verbal cues required       OT Short Term Goals - 04/13/18 1047      OT SHORT TERM GOAL #1   Title  Pt will be independent with updated HEP--check STGs  03/29/18.    Status  Achieved      OT SHORT TERM GOAL #2   Title  Pt will be able to write short paragraph with 100% legibility using strategies/AE prn.    Status  Achieved   90% legibility- 03/31/18.  04/13/18  met at approx this level      OT SHORT TERM GOAL #3   Title  Pt will demo incr ease with use of utensils as shown by improving PPT#2 to less than 19sec.    Status  Partially Met   met after pt was cued to place elbow oon table and change hand positon 15.06 secs, not met when pt does not use strategies>34 secs 03/31/18     OT SHORT TERM GOAL #4   Title  Pt will be able to fasten/unfasten 3 buttons in less than 30sec.    Status  Achieved   30.81, 46.19-03/31/18.  04/11/18:  21.94sec     OT SHORT TERM GOAL #5   Title  Pt will be able to pick up small objects using tip pinch at least 75% of the time with R hand.    Status  Partially Met   04/04/18:  not consistent (50% per observation in clinic without cueing).       OT Long Term Goals - 04/13/18 1039      OT LONG TERM GOAL #1   Title  Pt will verbalize understanding of strategies/AE to incr ease with ADLs (including writing, buttoning/dressing, picking up objects).--check LTGs 04/27/18.    Time  8    Period  Weeks    Status  Achieved      OT LONG TERM GOAL #2   Title  Pt will improve coordination for ADLs as shown by improving time on 9-hole peg test by at least 4sec with R hand.    Baseline  39.47sec    Time  8    Period  Weeks    Status  Not Met   04/11/18:  53.07, 48.47sec     OT LONG TERM GOAL #3   Title  Pt will improve coordination/functional reaching for ADLs as shown by improving score on box and blocks with RUE by at least 4 blocks    Baseline  RUE 38 blocks    Time  8     Period  Weeks    Status  Not Met  04/13/18:  40 blocks     OT LONG TERM GOAL #4   Title  Pt will improve R grip strength by at least 4lbs to assist in opening containers.    Baseline  33lbs    Time  8    Period  Weeks    Status  Not Met   04/11/18:  33lbs     OT LONG TERM GOAL #5   Title  Pt will demo at least 140* R shoulder flex with -10* elbow ext for functional reaching for ADLs.    Baseline  130* with -15* elbow ext    Time  8    Period  Weeks    Status  Partially Met   04/11/18:  135* with -10* elbow ext           Plan - 04/13/18 1028    Clinical Impression Statement  Pt has made progress with functional tasks, but R hand weakness and tremors continue to affect RUE functional use.  Pt to continue with HEP for this.    Occupational Profile and client history currently impacting functional performance  Pt is mod I for ADLs/IADLs, but reports that she is using L nondominant UE more for ADLs due to incr difficulty with RUE.      Occupational performance deficits (Please refer to evaluation for details):  ADL's;IADL's;Leisure;Social Participation    Rehab Potential  Good    OT Frequency  2x / week    OT Duration  8 weeks    OT Treatment/Interventions  Aquatic Therapy;Paraffin;Neuromuscular education;Splinting;Patient/family education;Therapeutic activities;Functional Mobility Training;Energy conservation;Fluidtherapy;Cryotherapy;Ultrasound;DME and/or AE instruction;Manual Therapy;Passive range of motion;Cognitive remediation/compensation;Therapeutic exercise;Self-care/ADL training;Moist Heat    Plan  d/c OT; recommend re-evaluation/screen in approx 6 months due to progressive nature of diagnosis and possible DBS surgery.    Consulted and Agree with Plan of Care  Patient       Patient will benefit from skilled therapeutic intervention in order to improve the following deficits and impairments:  Decreased cognition, Decreased mobility, Decreased coordination, Decreased activity  tolerance, Decreased range of motion, Decreased strength, Impaired tone, Improper spinal/pelvic alignment, Impaired UE functional use, Impaired perceived functional ability, Decreased balance  Visit Diagnosis: Other symptoms and signs involving the nervous system  Other abnormalities of gait and mobility  Abnormal posture  Other symptoms and signs involving the musculoskeletal system  Other lack of coordination  Tremor  Unsteadiness    Problem List Patient Active Problem List   Diagnosis Date Noted  . 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 Chronic nonallergic rhinitis 12/29/2016  . Mild persistent asthma, uncomplicated 37/62/8315  . 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 Gastroesophageal reflux disease 12/29/2016  . Cervical spondylosis with myelopathy and radiculopathy 11/14/2013  . Essential tremor 06/13/2013  . Cervical spondylosis without myelopathy 06/13/2013  . Breast cancer of lower-outer quadrant of left female breast ([place]) 09/10/2010    OCCUPATIONAL THERAPY DISCHARGE SUMMARY  Visits from Start of Care: 12  Current functional level related to goals / functional outcomes: See above   Remaining deficits: Tremor, bradykinesia, R hand weakness, decr balance, decr posture, decr RUE ROM, decr coordination    Education / Equipment: Pt was instructed in the following:  Updated HEP, adaptive strategies for ADLs/IADLs, ways to prevent future complications, appropriate community resources.  Pt verbalized understanding of all education provided.    Plan: Patient agrees to discharge.  Patient goals were partially met. Patient is being discharged due to being pleased with the current functional level.  Reaching maximal rehab potential  at this time.  Pt would benefit from re-evaluation/occupational therapy screen in approx 6 months to assess for need for further therapy/functional changes due to progressive nature of diagnosis.  ?????         [facility] 04/13/2018, 12:19 PM  [facility] [address] [place], [place], [address] Phone: [phone]   Fax:  [phone]  Name: April Church MRN: [id number] Date of Birth: July 27, 1940   Vianne Bulls, OTR/L [facility] [address] [place], [place]  [address] [phone] phone [phone] 04/13/18 12:19 PM

## 2018-04-17 DIAGNOSIS — Z961 Presence of intraocular lens: Secondary | ICD-10-CM | POA: Diagnosis not present

## 2018-04-18 DIAGNOSIS — G3184 Mild cognitive impairment, so stated: Secondary | ICD-10-CM | POA: Diagnosis not present

## 2018-04-18 DIAGNOSIS — G250 Essential tremor: Secondary | ICD-10-CM | POA: Diagnosis not present

## 2018-04-18 DIAGNOSIS — G20 Parkinson's disease: Secondary | ICD-10-CM | POA: Diagnosis not present

## 2018-04-18 DIAGNOSIS — F419 Anxiety disorder, unspecified: Secondary | ICD-10-CM | POA: Diagnosis not present

## 2018-05-30 DIAGNOSIS — Z1239 Encounter for other screening for malignant neoplasm of breast: Secondary | ICD-10-CM | POA: Diagnosis not present

## 2018-05-30 DIAGNOSIS — Z1211 Encounter for screening for malignant neoplasm of colon: Secondary | ICD-10-CM | POA: Diagnosis not present

## 2018-05-30 DIAGNOSIS — B0229 Other postherpetic nervous system involvement: Secondary | ICD-10-CM | POA: Diagnosis not present

## 2018-05-30 DIAGNOSIS — G20 Parkinson's disease: Secondary | ICD-10-CM | POA: Diagnosis not present

## 2018-05-30 DIAGNOSIS — I10 Essential (primary) hypertension: Secondary | ICD-10-CM | POA: Diagnosis not present

## 2018-05-30 DIAGNOSIS — Z0000 Encounter for general adult medical examination without abnormal findings: Secondary | ICD-10-CM | POA: Diagnosis not present

## 2018-05-30 DIAGNOSIS — Z1389 Encounter for screening for other disorder: Secondary | ICD-10-CM | POA: Diagnosis not present

## 2018-05-30 DIAGNOSIS — J45909 Unspecified asthma, uncomplicated: Secondary | ICD-10-CM | POA: Diagnosis not present

## 2018-05-30 DIAGNOSIS — F329 Major depressive disorder, single episode, unspecified: Secondary | ICD-10-CM | POA: Diagnosis not present

## 2018-05-30 DIAGNOSIS — C50512 Malignant neoplasm of lower-outer quadrant of left female breast: Secondary | ICD-10-CM | POA: Diagnosis not present

## 2018-05-30 DIAGNOSIS — E785 Hyperlipidemia, unspecified: Secondary | ICD-10-CM | POA: Diagnosis not present

## 2018-06-01 DIAGNOSIS — I10 Essential (primary) hypertension: Secondary | ICD-10-CM | POA: Diagnosis not present

## 2018-06-01 DIAGNOSIS — E785 Hyperlipidemia, unspecified: Secondary | ICD-10-CM | POA: Diagnosis not present

## 2018-06-12 ENCOUNTER — Other Ambulatory Visit: Payer: Self-pay | Admitting: Family Medicine

## 2018-06-12 DIAGNOSIS — J310 Chronic rhinitis: Secondary | ICD-10-CM

## 2018-06-14 ENCOUNTER — Telehealth: Payer: Self-pay

## 2018-06-14 NOTE — Telephone Encounter (Signed)
Fax from [facility]:  Azelastine 0.15% nasal spray not covered Alternatives include:   [employer]'s covered (formulary) drugs are fluticasone nasal spray, suspension, flunisolide nasal spray, ipratropium bromide nasal spray, azelastine 0.1% nasal spray, cetirizine oral solution.  PA started for Azelastine 0.15% Patient is currently taking Flonase, zyrtec

## 2018-06-22 MED ORDER — AZELASTINE HCL 0.1 % NA SOLN: 1.0000 | Freq: Two times a day (BID) | NASAL | 12 refills | Status: DC

## 2018-06-22 NOTE — Telephone Encounter (Signed)
PA for Azelastine denied.  Alternatives include:   [employer]'s covered (formulary) drugs are fluticasone nasal spray, suspension, flunisolide nasal spray, ipratropium bromide nasal spray, azelastine 0.1% nasal spray, cetirizine oral solution. Please advise.

## 2018-06-22 NOTE — Telephone Encounter (Signed)
Ok please send Azelastine 0.1% 1-2 sprays each nostril twice a day

## 2018-06-22 NOTE — Addendum Note (Signed)
Addended by: Horris Latino on: 06/22/2018 09:53 AM   Modules accepted: Orders

## 2018-06-22 NOTE — Telephone Encounter (Signed)
Script sent into pharmacy 

## 2018-06-27 ENCOUNTER — Telehealth: Payer: Self-pay | Admitting: *Deleted

## 2018-06-27 NOTE — Telephone Encounter (Signed)
Called patient and updated EMR. She stated she saw 2 drs at [facility], one was for neuropsych testing, and she had a scan (DAT). She hasn't heard back from them. She stated she is "anxious to hear from Dr Leta Baptist" about this. She verbalized understanding, appreciation of call.

## 2018-06-28 ENCOUNTER — Ambulatory Visit (INDEPENDENT_AMBULATORY_CARE_PROVIDER_SITE_OTHER): Payer: Medicare Other | Admitting: Diagnostic Neuroimaging

## 2018-06-28 ENCOUNTER — Other Ambulatory Visit: Payer: Self-pay

## 2018-06-28 DIAGNOSIS — G250 Essential tremor: Secondary | ICD-10-CM | POA: Diagnosis not present

## 2018-06-28 DIAGNOSIS — G20 Parkinson's disease: Secondary | ICD-10-CM | POA: Diagnosis not present

## 2018-06-28 NOTE — Progress Notes (Signed)
     Virtual Visit via Telephone Note  I connected with@ on 06/28/18 at  3:30 PM EDT by telephone and verified that I am speaking with the correct person using two identifiers.   I discussed the limitations, risks, security and privacy concerns of performing an evaluation and management service by telephone and the availability of in person appointments. I also discussed with the patient that there may be a patient responsible charge related to this service. The patient expressed understanding and agreed to proceed. Patient is at home and I am at the office.   History of Present Illness:  - overall doing about the same; tolerating carb/levo - c/o lower back pain and leg pain in lower ext - has gone to [facility] for second opinion; is a candidate for DBS per Dr. 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 follow up     I discussed the assessment and treatment plan with the patient. The patient was provided an opportunity to ask questions and all were answered. The patient agreed with the plan and demonstrated an understanding of the instructions.   The patient was advised to call back or seek an in-person evaluation if the symptoms worsen or if the condition fails to improve as anticipated.  I provided 15 minutes of non-face-to-face time during this encounter.    Penni Bombard, MD 03/30/35, 0:48 PM Certified in Neurology, Neurophysiology and Neuroimaging  [facility] [address] [place], [place] [address] [phone]

## 2018-07-07 DIAGNOSIS — G20 Parkinson's disease: Secondary | ICD-10-CM | POA: Diagnosis not present

## 2018-07-07 DIAGNOSIS — G250 Essential tremor: Secondary | ICD-10-CM | POA: Diagnosis not present

## 2018-07-12 DIAGNOSIS — I10 Essential (primary) hypertension: Secondary | ICD-10-CM | POA: Diagnosis not present

## 2018-07-12 DIAGNOSIS — J45909 Unspecified asthma, uncomplicated: Secondary | ICD-10-CM | POA: Diagnosis not present

## 2018-07-12 DIAGNOSIS — F329 Major depressive disorder, single episode, unspecified: Secondary | ICD-10-CM | POA: Diagnosis not present

## 2018-07-12 DIAGNOSIS — C50512 Malignant neoplasm of lower-outer quadrant of left female breast: Secondary | ICD-10-CM | POA: Diagnosis not present

## 2018-07-12 DIAGNOSIS — E785 Hyperlipidemia, unspecified: Secondary | ICD-10-CM | POA: Diagnosis not present

## 2018-08-02 ENCOUNTER — Ambulatory Visit: Payer: Medicare Other | Admitting: Diagnostic Neuroimaging

## 2018-08-30 DIAGNOSIS — G20 Parkinson's disease: Secondary | ICD-10-CM | POA: Diagnosis not present

## 2018-08-30 DIAGNOSIS — G250 Essential tremor: Secondary | ICD-10-CM | POA: Diagnosis not present

## 2018-09-05 ENCOUNTER — Telehealth: Payer: Self-pay | Admitting: Diagnostic Neuroimaging

## 2018-09-05 DIAGNOSIS — M4807 Spinal stenosis, lumbosacral region: Secondary | ICD-10-CM

## 2018-09-05 NOTE — Telephone Encounter (Signed)
Pt called in and stated she was going to [facility] for Deep Brain Surgery , but she decided not to do so , but she would like to speak with someone about her back aches and leg aches to see if it has anything to do with her parkinsons.

## 2018-09-06 NOTE — Telephone Encounter (Signed)
Consider MRI lumbar spine; then consider PT eval; pain mgmt (for injection). -VRP

## 2018-09-06 NOTE — Telephone Encounter (Signed)
Called patient and advised her Dr Leta Baptist recommends MRI of lumbar spine and then he'll consider PT therapy or pain management for injections. She stated that the aching pain concerns her, wakes her at night. She takes Tylenol ES as needed with short term relief. She cannot take NSAIDS because she only has one kidney. She agreed to having MRI lumbar spine. If she needs contrast, her [employer] physicians recently did labs on her. She is unsure if kidney function was tested. She stated re: DBS surgery-  due to complicated surgery and her family history of strokes she and her husband decided she will not get surgery.  She verbalized understanding, appreciation of call.

## 2018-09-07 ENCOUNTER — Telehealth: Payer: Self-pay | Admitting: Diagnostic Neuroimaging

## 2018-09-07 NOTE — Telephone Encounter (Signed)
Medicare/bcbs supp order sent to GI. No auth they will reach out to the patient to schedule.  

## 2018-09-18 DIAGNOSIS — E785 Hyperlipidemia, unspecified: Secondary | ICD-10-CM | POA: Diagnosis not present

## 2018-09-18 DIAGNOSIS — F329 Major depressive disorder, single episode, unspecified: Secondary | ICD-10-CM | POA: Diagnosis not present

## 2018-09-18 DIAGNOSIS — C50512 Malignant neoplasm of lower-outer quadrant of left female breast: Secondary | ICD-10-CM | POA: Diagnosis not present

## 2018-09-18 DIAGNOSIS — I10 Essential (primary) hypertension: Secondary | ICD-10-CM | POA: Diagnosis not present

## 2018-09-18 DIAGNOSIS — J45909 Unspecified asthma, uncomplicated: Secondary | ICD-10-CM | POA: Diagnosis not present

## 2018-10-11 ENCOUNTER — Other Ambulatory Visit: Payer: Self-pay

## 2018-10-11 ENCOUNTER — Ambulatory Visit
Admission: RE | Admit: 2018-10-11 | Discharge: 2018-10-11 | Disposition: A | Discharge status: Home / Self Care | Payer: Medicare Other | Source: Ambulatory Visit | Attending: Diagnostic Neuroimaging | Admitting: Diagnostic Neuroimaging

## 2018-10-11 DIAGNOSIS — M48061 Spinal stenosis, lumbar region without neurogenic claudication: Secondary | ICD-10-CM | POA: Diagnosis not present

## 2018-10-11 DIAGNOSIS — M4807 Spinal stenosis, lumbosacral region: Secondary | ICD-10-CM

## 2018-10-13 DIAGNOSIS — L720 Epidermal cyst: Secondary | ICD-10-CM | POA: Diagnosis not present

## 2018-10-13 DIAGNOSIS — D1801 Hemangioma of skin and subcutaneous tissue: Secondary | ICD-10-CM | POA: Diagnosis not present

## 2018-10-13 DIAGNOSIS — L814 Other melanin hyperpigmentation: Secondary | ICD-10-CM | POA: Diagnosis not present

## 2018-10-13 DIAGNOSIS — D224 Melanocytic nevi of scalp and neck: Secondary | ICD-10-CM | POA: Diagnosis not present

## 2018-10-13 DIAGNOSIS — L821 Other seborrheic keratosis: Secondary | ICD-10-CM | POA: Diagnosis not present

## 2018-10-26 ENCOUNTER — Telehealth: Payer: Self-pay | Admitting: Diagnostic Neuroimaging

## 2018-10-26 NOTE — Telephone Encounter (Signed)
Pt is asking for a call from RN with the results to her MRI

## 2018-11-01 ENCOUNTER — Telehealth: Payer: Self-pay | Admitting: *Deleted

## 2018-11-01 DIAGNOSIS — M4807 Spinal stenosis, lumbosacral region: Secondary | ICD-10-CM

## 2018-11-01 DIAGNOSIS — G20 Parkinson's disease: Secondary | ICD-10-CM

## 2018-11-01 DIAGNOSIS — G20A1 Parkinson's disease without dyskinesia, without mention of fluctuations: Secondary | ICD-10-CM

## 2018-11-01 DIAGNOSIS — R937 Abnormal findings on diagnostic imaging of other parts of musculoskeletal system: Secondary | ICD-10-CM

## 2018-11-01 NOTE — Telephone Encounter (Signed)
Spoke with patient and informed her the MRI lumbar spine showed multi-level pinched nerves in her lower back. Dr Leta Baptist stated she may consider pain management vs surgery evaluation. She stated she did not want to consider surgery unless it was a last resort. She didn't feel her pain was bad enough. She stated she gets Cortisone injections in her right knee for arthritis which is helpful. She would like to be referred to pain management. She then stated she will not be going to [facility] and would like to see Dr Leta Baptist for a 6 month FU from her telephone visit in May. We scheduled a 6 month FU. She verbalized understanding, appreciation. Pain management referral placed.

## 2018-11-02 DIAGNOSIS — M170 Bilateral primary osteoarthritis of knee: Secondary | ICD-10-CM | POA: Diagnosis not present

## 2018-11-09 DIAGNOSIS — Z23 Encounter for immunization: Secondary | ICD-10-CM | POA: Diagnosis not present

## 2018-11-15 DIAGNOSIS — D259 Leiomyoma of uterus, unspecified: Secondary | ICD-10-CM | POA: Diagnosis not present

## 2018-11-15 DIAGNOSIS — Z779 Other contact with and (suspected) exposures hazardous to health: Secondary | ICD-10-CM | POA: Diagnosis not present

## 2018-11-15 DIAGNOSIS — Z8041 Family history of malignant neoplasm of ovary: Secondary | ICD-10-CM | POA: Diagnosis not present

## 2018-11-15 DIAGNOSIS — Z124 Encounter for screening for malignant neoplasm of cervix: Secondary | ICD-10-CM | POA: Diagnosis not present

## 2018-11-15 DIAGNOSIS — Z01419 Encounter for gynecological examination (general) (routine) without abnormal findings: Secondary | ICD-10-CM | POA: Diagnosis not present

## 2018-12-04 DIAGNOSIS — K649 Unspecified hemorrhoids: Secondary | ICD-10-CM | POA: Diagnosis not present

## 2018-12-04 DIAGNOSIS — F329 Major depressive disorder, single episode, unspecified: Secondary | ICD-10-CM | POA: Diagnosis not present

## 2018-12-04 DIAGNOSIS — E785 Hyperlipidemia, unspecified: Secondary | ICD-10-CM | POA: Diagnosis not present

## 2018-12-04 DIAGNOSIS — I10 Essential (primary) hypertension: Secondary | ICD-10-CM | POA: Diagnosis not present

## 2018-12-04 DIAGNOSIS — M4726 Other spondylosis with radiculopathy, lumbar region: Secondary | ICD-10-CM | POA: Diagnosis not present

## 2018-12-08 ENCOUNTER — Encounter: Payer: Self-pay | Admitting: Physical Medicine and Rehabilitation

## 2018-12-08 ENCOUNTER — Encounter
Payer: Medicare Other | Attending: Physical Medicine and Rehabilitation | Admitting: Physical Medicine and Rehabilitation

## 2018-12-08 ENCOUNTER — Other Ambulatory Visit: Payer: Self-pay

## 2018-12-08 VITALS — BP 142/88 | HR 103 | Temp 97.7°F | Ht 61.0 in | Wt 127.0 lb

## 2018-12-08 DIAGNOSIS — M5416 Radiculopathy, lumbar region: Secondary | ICD-10-CM | POA: Insufficient documentation

## 2018-12-08 DIAGNOSIS — R2689 Other abnormalities of gait and mobility: Secondary | ICD-10-CM | POA: Diagnosis not present

## 2018-12-08 DIAGNOSIS — M7918 Myalgia, other site: Secondary | ICD-10-CM | POA: Diagnosis not present

## 2018-12-08 MED ORDER — PREGABALIN 50 MG PO CAPS: 50.0000 mg | ORAL_CAPSULE | Freq: Two times a day (BID) | ORAL | 3 refills | Status: DC

## 2018-12-08 NOTE — Patient Instructions (Signed)
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   1. Discussed options of Steroid epidural injections vs trigger point injections vs medication  2. PT evaluation suggest MacKenzie's and core strengthening exercises- you need to do the home exercise program 5 DAYS/week.   3. Lyrica/Pregabalin- 50 mg 2x/day x 1 week 100 mg 2x/day  4. Hold off on muscle relaxants by mouth  5. Replace stool softener- with Magnesium 400 mg 1-2x/day- over the counter.   6. Tennis balls- 3-4 minutes of PRESSURE- not massage, hold on "knots" in low back against the wall.   7. Wait on Dr Letta Pate- for possible referral at f/u.  8. F/U in 1 month

## 2018-12-08 NOTE — Progress Notes (Signed)
Subjective:    Patient ID: April Church, female    DOB: [birth year], 78 y.o.   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 Takes a low dose Effexor -37.5 mg - takes since Breast CA. PCP trying to take Effexor away from her.   Esp R flank pain when driving/sitting for prolonged period. Pain wakes her up sometimes- has to get up and move around. Moving around helps the pain.  Not sharp- more of an aching pain in back; RLE is an aching pain as well- not sharp/shooting pain; sometimes has burning in RLE- Denies traditional electrical/tingling pain.  Laying in bed makes it worse- so does sitting; esp the R flank pain. Made better by moving around- and takes 650 mg tylenol for pain. Never tried any Rx type pain med for pain.Tylenol helps- so can go to sleep; but pain is getting worse, so decided to see PM&R.    Had surgery 5 years ago for cervical impingement/compression- by Dr Arnoldo Morale- affected R arm- surgery on neck- was tingling and went to sleep.   Social Hx: Used to be a walker [place]- 10 minutes to gate and 10 minutes back- ~1x/week- can't handle further- due to RLE pain. Has stairs at house- in [facility] 1st floor- 3 STE to get into home and front door or garage.- has 1 rail to get up steps. Used to "bounce"  Up stairs- but not anymore- it's hard ot get upstairs. Married.   Hasn't had PT yet for Lumbar spine. [facility]. Sees them every 6 months -evaluates for writing/hand fine motor coordination and voice- for dysphonia. Has another appointment for re-evaluation in November.     MRI 10/10/2018 Lumbar spine MRI lumbar spine (without) demonstrating: - At L4-5: disc bulging and facet hypertrophy with  severe right and moderate left foraminal stenosis. - At L3-4: disc bulging and facet hypertrophy with moderate right and mild left foraminal stenosis. - At L5-S1: disc bulging and facet hypertrophy with mild right and moderate left foraminal stenosis.    Pain Inventory Average Pain 5 Pain Right Now 5 My pain is aching  In the last 24 hours, has pain interfered with the following? General activity 0 Relation with others 0 Enjoyment of life 0 What TIME of day is your pain at its worst? daytime Sleep (in general) Fair  Pain is worse with: walking and sitting Pain improves with: medication Relief from Meds: 4  Mobility walk without assistance ability to climb steps?  yes do you drive?  yes  Function not employed: date last employed . I need assistance with the following:  household duties  Neuro/Psych weakness tremor spasms  Prior Studies Any changes since last visit?  no  Physicians involved in your care Any changes since last visit?  no   Family History  Problem Relation Age of Onset  . Kidney disease Mother   . Stroke Father   . Stroke Sister        08/2015  . Diabetes Brother   . Heart disease Brother   . Ovarian cancer Sister    Social History   Socioeconomic History  . Marital status: Married    Spouse name: Marcello Moores   . Number of children:  1  . Years of education: HS  . Highest education level: Not on file  Occupational History  . Occupation: Retired  Scientific laboratory technician  . Financial resource strain: Not on file  . Food insecurity    Worry: Not on file    Inability: Not on file  . Transportation needs    Medical: Not on file    Non-medical: Not on file  Tobacco Use  . Smoking status: Never Smoker  . Smokeless tobacco: Never Used  Substance and Sexual Activity  . Alcohol use: No    Alcohol/week: 0.0 standard drinks  . Drug use: No  . Sexual activity: Yes  Lifestyle  . Physical activity    Days per week: Not on file    Minutes per session: Not  on file  . Stress: Not on file  Relationships  . Social Herbalist on phone: Not on file    Gets together: Not on file    Attends religious service: Not on file    Active member of club or organization: Not on file    Attends meetings of clubs or organizations: Not on file    Relationship status: Not on file  Other Topics Concern  . Not on file  Social History Narrative      Pt lives at home with her spouse.  [name]    Patient has 1 child.    Patient is retired.    Patient has a HS education.    Patient is right handed.    Patient drinks caffeine occasionally.              Past Surgical History:  Procedure Laterality Date  . ANTERIOR CERVICAL DECOMPRESSION/DISCECTOMY FUSION 4 LEVELS N/A 11/14/2013   Procedure: ANTERIOR CERVICAL DECOMPRESSION/DISCECTOMY FUSION 4 LEVELS;  Surgeon: Newman Pies, MD;  Location: [place];  Service: Neurosurgery;  Laterality: N/A;  C34 C45 C56 C67 anterior cervical fusion with interbody prosthesis plating and  bonegraft  . APPENDECTOMY  age 53  . BREAST LUMPECTOMY  06/16/2009   left; SLN bx.  [name] BREAST LUMPECTOMY  07/01/2009   re-excision  . BREAST SURGERY  1999   reduction  . CATARACT EXTRACTION, BILATERAL    . COLONOSCOPY WITH PROPOFOL N/A 12/03/2014   Procedure: COLONOSCOPY WITH PROPOFOL;  Surgeon: [name], MD;  Location: [facility];  Service: Endoscopy;  Laterality: N/A;  . FOOT SURGERY  06/22/11   left  . KNEE ARTHROSCOPY  03/12/2005   right  . KNEE ARTHROSCOPY     left  . NASAL SINUS SURGERY     x 2  . 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 TRIGGER FINGER RELEASE  12/16/2011   Procedure: RELEASE TRIGGER FINGER/A-1 PULLEY;  Surgeon: Tennis Must, MD;  Location: [facility];  Service: Orthopedics;  Laterality: Right;  RIGHT LONG FINGER TRIGGER RELEASE & ANNULAR CYST EXCISION  . TUMOR EXCISION  age 86   right arm   Past Medical History:  Diagnosis Date  .  Asthma    daily inhaler  . Breast cancer ([place])    left- radiation and surgery -dx. 2011- no further tx. now- [name] , [name]  . Cyst of finger 11/2011   annular cyst right long finger  . Dental crowns present   . Dermatitis   . Frequency of urination   . GERD (gastroesophageal reflux disease)   . Hemorrhoid   . Hyperlipidemia   . Hypertension  under control, has been on med. x 2 yrs.  [name] PONV (postoperative nausea and vomiting)   . Seasonal allergies   . Tremors of nervous system    hands  . Trigger finger of right hand 11/2011   long finger   BP (!) 142/88   Pulse (!) 103   Temp 97.7 F (36.5 C)   Ht 5\' 1"  (1.549 m)   Wt 127 lb (57.6 kg)   SpO2 96%   BMI 24.00 kg/m   Opioid Risk Score:   Fall Risk Score:  `1  Depression screen PHQ 2/9  No flowsheet data found.   Review of Systems  Constitutional: Negative.   HENT: Negative.   Eyes: Negative.   Respiratory: Negative.   Cardiovascular: Negative.   Gastrointestinal: Negative.   Endocrine: Negative.   Genitourinary: Negative.   Musculoskeletal: Positive for arthralgias, back pain and myalgias.  Skin: Negative.   Allergic/Immunologic: Negative.   Neurological: Positive for tremors and weakness.  Hematological: Negative.   Psychiatric/Behavioral: Negative.   All other systems reviewed and are negative.      Objective:   Physical Exam  Awake, alert, appropriate, trembly voice due to dysphonia, mild tremors seen in UEs, NAD MS: UEs 5/5 in UEs B/L- checked bicep/triceps/WE, grip and finger abd LEs- R HF 4+/5; otherwise 5/5 in B/L LEs Checked HF/KE/KF/DF/PF/and EHL (-) pain with hyperextension B/L Pulling sensation, but no increased pain with lumbar flexion Has tight musculature at R and L lower thoracic paraspinals R>L Multiple trigger points palpated R>L lumbar paraspinals   Neuro: Intact to light touch in UEs and LEs in all dermatomes B/L DTRs 2+ in patella and Achilles B/L         Assessment & Plan:  Patient is a 78 yr old female with 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   1. Discussed options of Steroid epidural injections vs trigger point injections vs medication  2. PT evaluation suggest MacKenzie's and core strengthening exercises  3. Lyrica/Pregabalin- 50 mg 2x/day x 1 week 100 mg 2x/day  4. Hold off on muscle relaxants by mouth  5. Replace stool softener- with Magnesium 400 mg 1-2x/day- over the counter.   6. Tennis balls- 3-4 minutes of PRESSURE- not massage, hold on "knots" in low back against the wall.   7. Wait on Dr Letta Pate- for possible referral at f/u.  8. F/U in 1 month  I spent a total of 45 minutes on appointment- more than 25 minutes making decision on plan and educating pt on myofascial release/muscel relaxation.

## 2018-12-11 ENCOUNTER — Encounter: Payer: Medicare Other | Admitting: Physical Medicine & Rehabilitation

## 2018-12-21 ENCOUNTER — Ambulatory Visit: Payer: Medicare Other | Admitting: Physical Therapy

## 2018-12-21 ENCOUNTER — Ambulatory Visit: Payer: Medicare Other | Admitting: Occupational Therapy

## 2018-12-21 ENCOUNTER — Ambulatory Visit: Payer: Medicare Other

## 2018-12-21 DIAGNOSIS — I10 Essential (primary) hypertension: Secondary | ICD-10-CM | POA: Diagnosis not present

## 2018-12-21 DIAGNOSIS — C50512 Malignant neoplasm of lower-outer quadrant of left female breast: Secondary | ICD-10-CM | POA: Diagnosis not present

## 2018-12-21 DIAGNOSIS — F329 Major depressive disorder, single episode, unspecified: Secondary | ICD-10-CM | POA: Diagnosis not present

## 2018-12-21 DIAGNOSIS — M4726 Other spondylosis with radiculopathy, lumbar region: Secondary | ICD-10-CM | POA: Diagnosis not present

## 2018-12-21 DIAGNOSIS — J45909 Unspecified asthma, uncomplicated: Secondary | ICD-10-CM | POA: Diagnosis not present

## 2018-12-21 DIAGNOSIS — E785 Hyperlipidemia, unspecified: Secondary | ICD-10-CM | POA: Diagnosis not present

## 2019-01-04 ENCOUNTER — Ambulatory Visit: Payer: Medicare Other

## 2019-01-04 ENCOUNTER — Other Ambulatory Visit: Payer: Self-pay

## 2019-01-04 ENCOUNTER — Ambulatory Visit: Payer: Medicare Other | Admitting: Occupational Therapy

## 2019-01-04 ENCOUNTER — Encounter: Payer: Self-pay | Admitting: Physical Therapy

## 2019-01-04 ENCOUNTER — Ambulatory Visit: Payer: Medicare Other | Admitting: Physical Therapy

## 2019-01-04 ENCOUNTER — Ambulatory Visit: Payer: Medicare Other | Attending: Internal Medicine | Admitting: Physical Therapy

## 2019-01-04 DIAGNOSIS — M5416 Radiculopathy, lumbar region: Secondary | ICD-10-CM | POA: Insufficient documentation

## 2019-01-04 DIAGNOSIS — R278 Other lack of coordination: Secondary | ICD-10-CM | POA: Insufficient documentation

## 2019-01-04 DIAGNOSIS — R2681 Unsteadiness on feet: Secondary | ICD-10-CM

## 2019-01-04 DIAGNOSIS — R1310 Dysphagia, unspecified: Secondary | ICD-10-CM | POA: Insufficient documentation

## 2019-01-04 DIAGNOSIS — M6281 Muscle weakness (generalized): Secondary | ICD-10-CM | POA: Diagnosis not present

## 2019-01-04 DIAGNOSIS — R2689 Other abnormalities of gait and mobility: Secondary | ICD-10-CM | POA: Diagnosis not present

## 2019-01-04 DIAGNOSIS — R471 Dysarthria and anarthria: Secondary | ICD-10-CM | POA: Insufficient documentation

## 2019-01-04 DIAGNOSIS — R293 Abnormal posture: Secondary | ICD-10-CM

## 2019-01-04 NOTE — Therapy (Signed)
[facility] [address] [place], [place], [address] Phone: [phone]   Fax:  [phone]  Patient Details  Name: April Church MRN: [id number] Date of Birth: 21-Dec-1940 Referring Provider:  Leeroy Cha,*  Encounter Date:   Occupational Therapy Parkinson's Disease Screen  Hand dominance:  RUE   Physical Performance Test item #2 (simulated eating):  25.22 sec   Fastening/unfastening 3 buttons in: 38.72.sec  9-hole peg test:    RUE  38.78sec        LUE  29.09 sec   Change in ability to perform ADLs/IADLs:  Increased difficulty with handwriting  Pt would benefit from occupational therapy evaluation due to  Decline in coordinaton and handwriting  P [name],[name] 01/04/2019, 8:07 AM  [facility] [address] [place], [place], [address] Phone: [phone]   Fax:  [phone]

## 2019-01-04 NOTE — Therapy (Signed)
[facility] [address] [place], [place], [address] Phone: [phone]   Fax:  [phone]  Patient Details  Name: April Church MRN: [id number] Date of Birth: Feb 13, 1941 Referring Provider:  Leeroy Cha,*  Encounter Date: 01/04/2019  Physical Therapy Parkinson's Disease Screen   Timed Up and Go test: 13.37 sec  10 meter walk test:11.78 sec = 2.78 ft/sec  5 time sit to stand test:20.75 sec  Patient would benefit from Physical Therapy evaluation due to slowed mobility measures compared to previous PT d/c 03/2018.  *Please note that pt states she has had a referral sent for back pain from Dr. Dagoberto Ligas.  PT unaware of this referral until today and will be able to fit her in for PT eval later this morning.    Nimrod Wendt W. 01/04/2019, 8:35 AM  Frazier Butt., PT  [facility] [address] [place], [place], [address] Phone: [phone]   Fax:  [phone]

## 2019-01-04 NOTE — Therapy (Signed)
[facility] [address] [place], [place], [address] Phone: [phone]   Fax:  [phone]  Patient Details  Name: April Church MRN: [id number] Date of Birth: [birth year] Referring Provider:  Andrey Spearman, MD  Encounter Date: 01/04/2019  Speech Therapy Parkinson's Disease Screen   Decibel Level today: low 70s dB  (WNL=70-72 dB) with sound level meter 30cm away from pt's mouth. Pt's conversational volume is WNL. 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 In the past it has been alleviated with Botox injection. Pt states she is not yet to the severity to warrant another injection.   Pt continues to experience difficulty in swallowing that may warrant objective swallowing evaluation. It has been approx one year since her last evaluation. Pt states she chokes on liquids "a few times a week" which, reportedly, has not incr'd in frequency since initially asked in a previous screening. No overt s/s aspiration PNA. SLP advised pt to cont with single sips which pt states greatly minimizes frequency of difficulty.  Pt would benefit from objective swallow study (MBSS or FEES)- please order via EPIC or call [phone] to schedule.  Pt does does not require speech therapy services for voice volume at this time. However, therapy may be recommended swallowing therapy following an objective swallow study, which pt could attend at this facility.   [facility] ,[place], [place]  01/04/2019, 8:40 AM  [facility] [address] [place], [place], [address] Phone: [phone]   Fax:  [phone]

## 2019-01-04 NOTE — Therapy (Signed)
[facility] [address] [place], [place], [address] Phone: [phone]   Fax:  [phone]  Physical Therapy Evaluation  Patient Details  Name: April Church MRN: [id number] Date of Birth: 1940-09-30 Referring Provider (PT): Dagoberto Ligas, [place]   Encounter Date: 01/04/2019  PT End of Session - 01/04/19 1547    Visit Number  1    Number of Visits  13    Date for PT Re-Evaluation  04/04/19    Authorization Type  Medicare/BCBS-Will need 10th visit progress note    PT Start Time  0930    PT Stop Time  1015    PT Time Calculation (min)  45 min    Activity Tolerance  Patient tolerated treatment well;No increased pain    Behavior During Therapy  WFL for tasks assessed/performed       Past Medical History:  Diagnosis Date  . Asthma    daily inhaler  . Breast cancer (April Church)    left- radiation and surgery -dx. 2011- no further tx. now- [name] , [name]  . Cyst of finger 11/2011   annular cyst right long finger  . Dental crowns present   . Dermatitis   . Frequency of urination   . GERD (gastroesophageal reflux disease)   . Hemorrhoid   . Hyperlipidemia   . Hypertension    under control, has been on med. x 2 yrs.  April Church PONV (postoperative nausea and vomiting)   . Seasonal allergies   . Tremors of nervous system    hands  . Trigger finger of right hand 11/2011   long finger    Past Surgical History:  Procedure Laterality Date  . ANTERIOR CERVICAL DECOMPRESSION/DISCECTOMY FUSION 4 LEVELS N/A 11/14/2013   Procedure: ANTERIOR CERVICAL DECOMPRESSION/DISCECTOMY FUSION 4 LEVELS;  Surgeon: Newman Pies, MD;  Location: [facility];  Service: Neurosurgery;  Laterality: N/A;  C34 C45 C56 C67 anterior cervical fusion with interbody prosthesis plating and  bonegraft  . APPENDECTOMY  age 73  . BREAST LUMPECTOMY  06/16/2009   left; SLN bx.  April Church BREAST LUMPECTOMY  07/01/2009   re-excision  . BREAST SURGERY  1999   reduction  . CATARACT EXTRACTION, BILATERAL    . COLONOSCOPY WITH PROPOFOL N/A 12/03/2014   Procedure: COLONOSCOPY WITH PROPOFOL;  Surgeon: [name], MD;  Location: [facility];  Service: Endoscopy;  Laterality: N/A;  . FOOT SURGERY  06/22/11   left  . KNEE ARTHROSCOPY  03/12/2005   right  . KNEE ARTHROSCOPY     left  . NASAL SINUS SURGERY     x 2  . NEPHRECTOMY LIVING DONOR Left 04/2008   donated to spouse 2010(April Church)  . TRIGGER FINGER RELEASE  12/16/2011   Procedure: RELEASE TRIGGER FINGER/A-1 PULLEY;  Surgeon: Tennis Must, MD;  Location: [facility];  Service: Orthopedics;  Laterality: Right;  RIGHT LONG FINGER TRIGGER RELEASE & ANNULAR CYST EXCISION  . TUMOR EXCISION  age 85   right arm    There were no vitals filed for this visit.   Subjective Assessment - 01/04/19 0935    Subjective  Pt reports back and R leg pain, has been going on at least  6 months.  Dr. Leta April Church sent me for MRI in August, and it looks like nerve damage low back.  Have had experience where it feels like R knee will buckle.  Have hard time getting up and sometimes cannot get comfortable in the bed.    Pertinent  History  Hx of Parkinson's disease; donated kidney to husband; hx of breast cancer    How long can you walk comfortably?  [place] and back; and that's the most she can do before leg pain limits    Diagnostic tests  MRI 09/2018, shows multi leve disc bulge lumbar spine    Patient Stated Goals  Pt's goal for therapy is to be more sure of balance and have less pain.    Currently in Pain?  Yes    Pain Score  3    Worst:  8/10; Best  2/10   Pain Location  Back    Pain Orientation  Right;Left;Lower    Pain Descriptors / Indicators  Aching    Pain Type  Chronic pain   >6 months   Pain Radiating Towards  RLE all the way down leg    Pain Onset  More than a month ago    Pain Frequency  Intermittent    Aggravating Factors   walking aggravates  pain (>5 minutes), getting up out of chair/low surfaces; laying in bed    Pain Relieving Factors  Tylenol, change of positions         [facility] PT Assessment - 01/04/19 0945      Assessment   Medical Diagnosis  lumbar radiculopathy, myofascial pain, gait and mobility    Referring Provider (PT)  Lovorn, Megan    Onset Date/Surgical Date  12/15/18   MD visit; pain > 6 months   Hand Dominance  Right      Precautions   Precautions  Fall      Balance Screen   Has the patient fallen in the past 6 months  No    Has the patient had a decrease in activity level because of a fear of falling?   Yes   Steps, overall more cautious   Is the patient reluctant to leave their home because of a fear of falling?   No      Home Film/video editor residence    Living Arrangements  Spouse/significant other    Available Help at Discharge  Family    Type of [place] to enter    Entrance Stairs-Number of Steps  3    Entrance Stairs-Rails  [place]  Two level;Able to live on main level with bedroom/bathroom    Alternate Level Stairs-Number of Steps  16    Alternate Level Stairs-Rails  Left;Right    Home Equipment  None      Prior Function   Level of Independence  Independent    Vocation  Retired    Leisure  Used to enjoy walking in neighborhood, but pain has limited.      Observation/Other Assessments   Observations  Level posture through hips, L shoulder lower than R; R scapular winging/protruding    Other Surveys   Oswestry Disability Index    Oswestry Disability Index   Oswestry score:  32% disability rating.  Rates Lifting (4), Walking (2), sitting (3), standing (3), sleeping (1)      Posture/Postural Control   Posture/Postural Control  Postural limitations    Postural Limitations  Rounded Shoulders;Forward head;Increased thoracic kyphosis   tightness noted in hamstrings with forward flexion tasks     ROM / Strength   AROM  / PROM / Strength  Strength;PROM      PROM  Overall PROM   Deficits    Overall PROM Comments  Passive ROM hamstrings, measured supine hip/knee flexion>into knee extension:  RLE -40 degrees from full extension; LLE -20 degrees from full extension.      Strength   Overall Strength Comments  Grossly tested at least 4+/5 bilateral lower extremities.        Palpation   Palpation comment  Tender to palpation along paraspinal musclulature lumbar spine      Special Tests   Other special tests  Repeated Movements:  flexion x 10 reps with c/o low back and RLE pain 3/10.  Repeated extension x 10 reps, with pain centralized to only low back, 3/10.  Sidebending R and L repeated x 5 no inc in pain; trunk rotation seated x 5, no increase in pain.  SLR test:  no pain noted in low back with SLR test; she c/o tightness in hamstrings in SLR at 70 degrees on RLE, 74 degrees on LLE.      Transfers   Transfers  Sit to Stand;Stand to Sit    Sit to Stand  6: Modified independent (Device/Increase time);Without upper extremity assist;From chair/3-in-1    Five time sit to stand comments   20.75    Stand to Sit  6: Modified independent (Device/Increase time);Without upper extremity assist;To chair/3-in-1      Ambulation/Gait   Ambulation/Gait  Yes    Ambulation/Gait Assistance  7: Independent    Ambulation Distance (Feet)  100 Feet    Assistive device  None    Gait Pattern  Step-through pattern;Decreased arm swing - right;Decreased step length - right;Narrow base of support    Ambulation Surface  Level;Indoor    Gait velocity  11.78 sec = 2.78 ft/sec                Objective measurements completed on examination: See above findings.          Pt performs standing lumbar extension x 5 reps, seated anterior/posterior pelvic tilts x 5 reps, seated hamstring stretch, RLE foot propped on 4" block (therapist's shoe today), 2 reps x 30 seconds.  Initiated HEP with these exercises.    PT Education  - 01/04/19 1546    Education Details  Initiated HEP to address lumbar radiculopathy pain; explained centralizing symptoms and lessening pain through appropriate exercises    Person(s) Educated  Patient    Methods  Explanation;Demonstration;Handout    Comprehension  Verbalized understanding;Returned demonstration       PT Short Term Goals - 01/04/19 1602      PT SHORT TERM GOAL #1   Title  Pt will be independent with HEP for decreased pain and improved functional strength and gait.  TARGET 02/02/2019 (may be delayed due to scheduling around holidays)    Time  4    Period  Weeks    Status  New    Target Date  02/02/19      PT SHORT TERM GOAL #2   Title  Pt will improve 5x sit<>stand to less than or equal to 17 seconds for improved functional lower extremity strength.    Baseline  5x sit<>stand at eval 20.75 sec    Time  4    Period  Weeks    Status  New    Target Date  02/02/19      PT SHORT TERM GOAL #3   Title  Pt will improve R hamstring ROM by at least 10 degrees for improved flexibility, decreased RLE pain.  Baseline  40 degrees from knee extension in 90/90 supine position    Time  4    Period  Weeks    Status  New    Target Date  02/02/19      PT SHORT TERM GOAL #4   Title  Pt will be able to ambulate > 5 minutes x 2 (simulating her neighborhood) without pain increase in low back and legs.    Baseline  able to walk 5 minutes, take break 5 additional minutes prior to needing to stop due to pain    Time  4    Period  Weeks    Status  New    Target Date  02/02/19      PT SHORT TERM GOAL #5   Title  Pt will verbalize understanding of posture/positioning with ADLs and sleep to decrease back pain with funcitonal activities.    Time  4    Period  Weeks    Status  New    Target Date  02/02/19        PT Long Term Goals - 01/04/19 1607      PT LONG TERM GOAL #1   Title  Pt will be independent with progression of HEP for decreased pain, improved mobility, strength,  balance.  02/22/2019, 6 weeks for all LTGs    Time  6    Period  Weeks    Status  New      PT LONG TERM GOAL #2   Title  FGA to be assessed, with goal to be written as apporpriate.    Time  6    Period  Weeks    Status  New      PT LONG TERM GOAL #3   Title  Pt will improve 5x sit<>stand to less than or equal to 15 seconds for improved functional lower extremity strength.    Time  6    Period  Weeks    Status  New      PT LONG TERM GOAL #4   Title  Pt will improve gait velocity to at least 3 ft/sec for improved gait efficiency and safety in community.    Time  6    Period  Weeks    Status  New      PT LONG TERM GOAL #5   Title  Pt will report at least 10 percentage point decrease in Oswestry Back pain score, to demonstrate improved low back pain with funcitonal mobility and daily tasks.    Baseline  Eval:  Oswestry 32%    Period  Weeks    Status  New             Plan - 01/04/19 1551    Clinical Impression Statement  Pt is a 78 year old female who presents to OPPT with referral for lumbar radiculopathy.  She is known to this PT based on her history of Parkinson's disease, and was in clinic earlier today for PD screen when she mentioned PT referral for back and leg pain.  PT able to see her at subsequent time today.  She presents to OPPT today with >6 month history of low back and RLE pain, worse with prolonged standing, sitting, walking activities.  MRI done in 09/2018 shows lumbar disc bulge, foraminal stenosis at L1-L5/S1.  Pt demonstrates centralization of symptoms with standing lumbar extension repeated movements.  She demonstrates decreased flexibility, abnormal posture, decreased functional strength in sit<>stand, slowed gait velocity compared to pervious bout of  therapy.  She will beneift from skilled PT to address the above stated deficits to lessen low back pain, improve participation in ADLs, improve funcitonal mobility.    Personal Factors and Comorbidities   Comorbidity 3+    Comorbidities  Parkinson's disease, breast cancer; gave kidney to husband for transplant    Examination-Activity Limitations  Bed Mobility;Transfers;Sit;Stand;Locomotion Level    Examination-Participation Restrictions  Meal Prep;Community Activity   Walking in neighborhood   Stability/Clinical Decision Making  Evolving/Moderate complexity    Clinical Decision Making  Moderate    Rehab Potential  Good    PT Frequency  2x / week    PT Duration  6 weeks   plus eval   PT Treatment/Interventions  ADLs/Self Care Home Management;Electrical Stimulation;Ultrasound;Traction;Moist Heat;Gait training;Functional mobility training;Therapeutic activities;Balance training;Therapeutic exercise;Neuromuscular re-education;Patient/family education    PT Next Visit Plan  Review HEP given at eval; work extension based activities if she has been tolerated standing extension well; try prone if able; SKTC stretch, trunk rotation/hamstring stretches; please complete DGI or FGA as balance measure    Consulted and Agree with Plan of Care  Patient       Patient will benefit from skilled therapeutic intervention in order to improve the following deficits and impairments:  Abnormal gait, Decreased mobility, Decreased balance, Difficulty walking, Decreased strength, Postural dysfunction, Impaired flexibility  Visit Diagnosis: Radiculopathy, lumbar region  Abnormal posture  Muscle weakness (generalized)  Other abnormalities of gait and mobility  Unsteadiness on feet     Problem List Patient Active Problem List   Diagnosis Date Noted  . Lumbar radiculopathy 12/08/2018  . Myofascial pain 12/08/2018  . Impaired gait and mobility 12/08/2018  . 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 Chronic nonallergic rhinitis 12/29/2016  . Mild persistent asthma, uncomplicated [id number]  . 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 Gastroesophageal reflux disease 12/29/2016  . Cervical spondylosis with  myelopathy and radiculopathy 11/14/2013  . Essential tremor 06/13/2013  . Cervical spondylosis without myelopathy 06/13/2013  . Breast cancer of lower-outer quadrant of left female breast ([place]) 09/10/2010    Hertha Gergen W. 01/04/2019, 4:12 PM Frazier Butt., PT  [facility] [address] [place], [place], [address] Phone: [phone]   Fax:  [phone]  Name: MARIELLY SPINALE MRN: [id number] Date of Birth: 09-20-1940

## 2019-01-04 NOTE — Patient Instructions (Signed)
Provided patient with handouts from basic back packet:  -Seated hamstring stretch, 3 reps, 30 sec each leg; foot propped on block or book -Seated anterior/posterior pelvic tilts x 5 reps -Standing lumbar extension x 5-10 reps, 1-2 x/day  Discussed use of pillow for positioning of legs during sleeping

## 2019-01-05 ENCOUNTER — Encounter: Payer: Self-pay | Admitting: Physical Medicine and Rehabilitation

## 2019-01-05 ENCOUNTER — Other Ambulatory Visit: Payer: Self-pay

## 2019-01-05 ENCOUNTER — Encounter
Payer: Medicare Other | Attending: Physical Medicine and Rehabilitation | Admitting: Physical Medicine and Rehabilitation

## 2019-01-05 VITALS — BP 135/86 | HR 89 | Temp 97.7°F | Ht 61.0 in | Wt 126.4 lb

## 2019-01-05 DIAGNOSIS — M5416 Radiculopathy, lumbar region: Secondary | ICD-10-CM | POA: Insufficient documentation

## 2019-01-05 DIAGNOSIS — M7918 Myalgia, other site: Secondary | ICD-10-CM | POA: Diagnosis not present

## 2019-01-05 DIAGNOSIS — R2689 Other abnormalities of gait and mobility: Secondary | ICD-10-CM | POA: Diagnosis not present

## 2019-01-05 NOTE — Patient Instructions (Signed)
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    1. Steroid epidural injections- discussed with pt- she wants to meet with PCP to discuss further next week and will give [place] a call.  2. Con't Magnesium but do want pt to increase to at least [phone] mg/day since taking 250 mg tabs.  3. Magnesium is a natural muscle relaxant.  4. Con't tennis balls- 2x/day as needed  5.  Get in with PT starting next week- 5 days/week  6. 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 Pt to discuss with PCP and then cancel if decides against do it.  7. Next choice for nerve pain meds is Duloxetine- 20 mg nightly x 1 weke then 40 mg nightly x 1 week then 60 mg nightly- for nerve pain. Has FDA approval for back and nerve pain. [phone] Dr Dagoberto Ligas and Dr Letta Pate (for injections)

## 2019-01-05 NOTE — Progress Notes (Signed)
Subjective:    Patient ID: April Church, female    DOB: [birth year], 78 y.o.   MRN: [id number]  HPI   Patient is a 78 yr old female with 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   Currently, having a lot of hoarseness- ENT thinks her hoarseness is allergy related and pt does too.   Couldn't take Lyrica- drunk when got up to go to the bathroom- so stopped it.     Doing the magnesium- hasn't noticed a difference- Mg 250 mg  - takes 1x/in AM and 1 in evening. Not having loose stools with it. Stopped taking senokot. Hasn't noticed much difference, but sounds like taking too little- still having harder stools.  Tennis balls help- esp under shoulders- can tell a difference definitely- which is good.   Starting therapy next week 2x/week- took awhile to be evaluated until yesterday. Is familiar with PT will be seeing.  Went back to Taking tylenol 500 mg - takes 1 during day and then takes another one.  Got up this AM with RLE aching-  So still really stiff/aching and bothering her more with weather related chilliness.   Has had steroid injections in past into hip and knees with good results-    Pain Inventory Average Pain 6 Pain Right Now 4 My pain is burning and dull  In the last 24 hours, has pain interfered with the following? General activity 3 Relation with others 0 Enjoyment of life 0 What TIME of day is your pain at its worst? morning and daytime Sleep (in general) Fair  Pain is worse with: walking and unsure Pain improves with: medication Relief from Meds: 5  Mobility walk without assistance how many minutes can you walk? 10 ability to climb steps?  yes do you drive?  yes  Function retired  Neuro/Psych tremor trouble  walking  Prior Studies Any changes since last visit?  no  Physicians involved in your care Any changes since last visit?  no   Family History  Problem Relation Age of Onset  . Kidney disease Mother   . Stroke Father   . Stroke Sister        08/2015  . Diabetes Brother   . Heart disease Brother   . Ovarian cancer Sister    Social History   Socioeconomic History  . Marital status: Married    Spouse name: Marcello Moores   . Number of children: 1  . Years of education: HS  . Highest education level: Not on file  Occupational History  . Occupation: Retired  Scientific laboratory technician  . Financial resource strain: Not on file  . Food insecurity    Worry: Not on file    Inability: Not on file  . Transportation needs    Medical: Not on file    Non-medical: Not on file  Tobacco Use  . Smoking status: Never Smoker  . Smokeless tobacco: Never Used  Substance and Sexual Activity  . Alcohol use: No    Alcohol/week: 0.0 standard drinks  . Drug use: No  . Sexual activity: Yes  Lifestyle  . Physical activity    Days per week: Not on file    Minutes per session: Not on file  . Stress: Not on file  Relationships  . Social Herbalist on phone: Not on file    Gets together: Not on file    Attends religious service: Not on file    Active member of club or organization: Not on file    Attends meetings of clubs or organizations: Not on file    Relationship status: Not on file  Other Topics Concern  . Not on file  Social History Narrative      Pt lives at home with her spouse.  [name]    Patient has 1 child.    Patient is retired.    Patient has a HS education.    Patient is right handed.    Patient drinks caffeine occasionally.              Past Surgical History:  Procedure Laterality Date  . ANTERIOR CERVICAL DECOMPRESSION/DISCECTOMY FUSION 4 LEVELS N/A 11/14/2013   Procedure: ANTERIOR CERVICAL DECOMPRESSION/DISCECTOMY FUSION 4 LEVELS;  Surgeon: Newman Pies, MD;  Location:  [facility];  Service: Neurosurgery;  Laterality: N/A;  C34 C45 C56 C67 anterior cervical fusion with interbody prosthesis plating and  bonegraft  . APPENDECTOMY  age 32  . BREAST LUMPECTOMY  06/16/2009   left; SLN bx.  [name] BREAST LUMPECTOMY  07/01/2009   re-excision  . BREAST SURGERY  1999   reduction  . CATARACT EXTRACTION, BILATERAL    . COLONOSCOPY WITH PROPOFOL N/A 12/03/2014   Procedure: COLONOSCOPY WITH PROPOFOL;  Surgeon: [name], MD;  Location: [facility];  Service: Endoscopy;  Laterality: N/A;  . FOOT SURGERY  06/22/11   left  . KNEE ARTHROSCOPY  03/12/2005   right  . KNEE ARTHROSCOPY     left  . NASAL SINUS SURGERY     x 2  . 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 TRIGGER FINGER RELEASE  12/16/2011   Procedure: RELEASE TRIGGER FINGER/A-1 PULLEY;  Surgeon: Tennis Must, MD;  Location: [facility];  Service: Orthopedics;  Laterality: Right;  RIGHT LONG FINGER TRIGGER RELEASE & ANNULAR CYST EXCISION  . TUMOR EXCISION  age 42   right arm   Past Medical History:  Diagnosis Date  . Asthma    daily inhaler  . Breast cancer ([place])    left- radiation and surgery -dx. 2011- no further tx. now- [name] , [name]  . Cyst of finger 11/2011   annular cyst right long finger  . Dental crowns present   . Dermatitis   . Frequency of urination   . GERD (gastroesophageal reflux disease)   . Hemorrhoid   . Hyperlipidemia   . Hypertension    under control, has been on med. x 2 yrs.  [name] PONV (postoperative nausea and vomiting)   . Seasonal allergies   . Tremors of nervous system    hands  . Trigger finger of right hand 11/2011   long finger   BP 135/86   Pulse 89   Temp 97.7 F (36.5 C)   Ht 5\' 1"  (1.549 m)   Wt 126 lb 6.4 oz (57.3 kg)   SpO2 97%   BMI 23.88 kg/m   Opioid Risk Score:   Fall Risk Score:  `1  Depression screen PHQ 2/9  No flowsheet data found.  Review of Systems  Musculoskeletal: Positive  for gait problem.  Neurological: Positive for tremors.  All other systems reviewed and are negative.      Objective:   Physical Exam  Awake, alert, appropriate, sitting on table, NAD No change in strength since last visit.   UEs 5/5 in UEs B/L- checked bicep/triceps/WE, grip and finger abd LEs- R HF 4+/5; otherwise 5/5 in B/L LEs Checked HF/KE/KF/DF/PF/and EHL    Assessment & Plan:   Patient is a 78 yr old female with 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    1. Steroid epidural injections- discussed with pt- she wants to meet with PCP to discuss further next week and will give [place] a call.  2. Con't Magnesium but do want pt to increase to at least [phone] mg/day since taking 250 mg tabs.  3. Magnesium is a natural muscle relaxant.  4. Con't tennis balls- 2x/day as needed  5.  Get in with PT starting next week- 5 days/week  6. 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 Pt to discuss with PCP and then cancel if decides against do it.  7. Next choice for nerve pain meds is Duloxetine- 20 mg nightly x 1 weke then 40 mg nightly x 1 week then 60 mg nightly- for nerve pain. Has FDA approval for back and nerve pain.

## 2019-01-08 ENCOUNTER — Other Ambulatory Visit: Payer: Self-pay | Admitting: *Deleted

## 2019-01-08 DIAGNOSIS — G20 Parkinson's disease: Secondary | ICD-10-CM

## 2019-01-08 DIAGNOSIS — R29898 Other symptoms and signs involving the musculoskeletal system: Secondary | ICD-10-CM

## 2019-01-09 ENCOUNTER — Other Ambulatory Visit: Payer: Self-pay

## 2019-01-09 ENCOUNTER — Encounter: Payer: Self-pay | Admitting: Diagnostic Neuroimaging

## 2019-01-09 ENCOUNTER — Ambulatory Visit (INDEPENDENT_AMBULATORY_CARE_PROVIDER_SITE_OTHER): Payer: Medicare Other | Admitting: Diagnostic Neuroimaging

## 2019-01-09 VITALS — BP 134/87 | HR 88 | Temp 97.8°F | Ht 61.0 in | Wt 127.8 lb

## 2019-01-09 DIAGNOSIS — G20 Parkinson's disease: Secondary | ICD-10-CM

## 2019-01-09 DIAGNOSIS — G250 Essential tremor: Secondary | ICD-10-CM | POA: Diagnosis not present

## 2019-01-09 DIAGNOSIS — R498 Other voice and resonance disorders: Secondary | ICD-10-CM

## 2019-01-09 DIAGNOSIS — M4722 Other spondylosis with radiculopathy, cervical region: Secondary | ICD-10-CM

## 2019-01-09 DIAGNOSIS — M4712 Other spondylosis with myelopathy, cervical region: Secondary | ICD-10-CM

## 2019-01-09 DIAGNOSIS — G20A1 Parkinson's disease without dyskinesia, without mention of fluctuations: Secondary | ICD-10-CM

## 2019-01-09 DIAGNOSIS — M4807 Spinal stenosis, lumbosacral region: Secondary | ICD-10-CM | POA: Diagnosis not present

## 2019-01-09 MED ORDER — CARBIDOPA-LEVODOPA 25-100 MG PO TABS: 2.0000 | ORAL_TABLET | Freq: Three times a day (TID) | ORAL | 4 refills | Status: DC

## 2019-01-09 MED ORDER — PRIMIDONE 50 MG PO TABS: 100.0000 mg | ORAL_TABLET | Freq: Three times a day (TID) | ORAL | 4 refills | Status: DC

## 2019-01-09 NOTE — Progress Notes (Signed)
[facility]  PATIENT: April Church DOB: [birth year]  REFERRING CLINICIAN:  HISTORY FROM: patient REASON FOR VISIT: follow up    HISTORICAL  CHIEF COMPLAINT:  Chief Complaint  Patient presents with  . Parkinson's disease    rm 7, 6 month FU, "going to pain management; have a question about injections"    HISTORY OF PRESENT ILLNESS:   UPDATE (01/09/19, VRP): Since last visit, doing well. Symptoms are stable. Severity is moderate. No alleviating or aggravating factors. Tolerating carb/levo 2 tabs three times a day. Planning for pain mgmt injections soon for low back pain.     UPDATE (01/31/18, VRP): Since last visit, doing about the same, except slightly more cramps and pain in arms and back. No alleviating or aggravating factors. Tolerating carb/levo.    UPDATE (05/23/17, VRP): Since last visit, doing well. Tolerating meds. No alleviating or aggravating factors. Tremor slightly worse. Some memory issues. Taking care of own ADLs. No major changes.  UPDATE 11/15/16: Since last visit, overall doing well. Tremor stable. Some memory loss issues continue, but not affecting ADLs. Tolerating meds. No falls. 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   UPDATE 05/10/16: Since last visit, stable. Some mild tremors in left hand. Mild balance issues. Mild memory issues. Cyclobenz helps with night time spasms.   UPDATE 11/12/15: Since last visit, tremors are stable. Balance stable. Now with night time neck, shoulder, spine and leg pain (spasm). Had PT and OT evals as well.   UPDATE 05/13/15: Since last visit, tremor in arms better. Had ST and botox for voice issues. Some wearing off of meds before mid-day dosing. No neck pain issues.   UPDATE 02/12/15: Since last visit, tremor is stable. More voice strangulation issues. Tolerating primidone 100mg  TID.  UPDATE 08/13/14: Since last visit, tried primidone 250mg  BID, but not much tremor benefit; also having more  sleepiness in the day time. Still with hand > voice > chin tremor. No falls. Balance getting slightly worse.  UPDATE 05/22/14: Since last visit, tremor progressing. Now notes new resting tremor in the last year. Some balance and walking issues. She is s/p cervical decompression in Sept 2015, with good results.   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 Her tremor does not limit any of her activities of daily life. She denies any side effects of the Mysoline. She also reports today that she's had some numbness and tingling down her right arm from her neck and shoulder. It has been bothering her more in the last 6 months. 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 She saw Dr. Arnoldo Morale back in 2004. She returns for reevaluation  UPDATE 06/07/12 (VRP): Doing well. BUE tremor is stable and controlled. Voice tremor is slightly progressing. Tolerating primidone.  PRIOR HPI (06/09/11, VRP):  78 year old female returns for F/U. Last seen 06/09/10.  She has been followed in this office for many years for essential tremor. She takes primidone 50 mg, 1.5 TID. She tolerates it well. She comes in today saying that her tremor is a little worse before lunch.  The tremor limits her a little bit with eating and writing but for the most part she is happy with her level of function.She has hx of breast cancer.  No neurologic complaints.    REVIEW OF SYSTEMS: Full 14 system review of systems performed and negative except: as per HPI.  ALLERGIES: Allergies  Allergen Reactions  . Shellfish Allergy Shortness Of Breath  . Duloxetine Itching and Rash  . Sulfa Drugs Cross Reactors Anxiety and Rash  . Lyrica [Pregabalin] Other (See Comments)    "made me drunk, couldn't function"  . Codeine Anxiety    HOME MEDICATIONS: Outpatient Medications Prior to Visit  Medication Sig Dispense Refill  .  albuterol (PROVENTIL HFA;VENTOLIN HFA) 108 (90 Base) MCG/ACT inhaler Inhale 1 puff every 6 (six) hours as needed into the lungs for wheezing or shortness of breath. 1 Inhaler 3  . amLODipine (NORVASC) 5 MG tablet Take 1 tablet (5 mg total) by mouth 2 (two) times daily.    [name] aspirin 81 MG tablet Take 81 mg by mouth daily.      [name] atorvastatin (LIPITOR) 20 MG tablet Take 20 mg by mouth every other day.     [name] azelastine (ASTELIN) 0.1 % nasal spray Place 1-2 sprays into both nostrils 2 (two) times daily. Use in each nostril as directed 30 mL 12  . budesonide-formoterol (SYMBICORT) 160-4.5 MCG/ACT inhaler Inhale 2 puffs into the lungs 2 (two) times daily. 1 Inhaler 5  . CALCIUM CARBONATE PO Take 1 tablet by mouth 2 (two) times daily.    . carbidopa-levodopa (SINEMET IR) 25-100 MG tablet Take 1-2 tablets by mouth 3 (three) times daily before meals. 540 tablet 4  . Cholecalciferol (VITAMIN D) 1000 UNITS capsule Take 1,000 Units by mouth 2 (two) times daily.     . Coenzyme Q10 (CO Q 10) 100 MG CAPS Take by mouth.    . EPINEPHrine 0.3 mg/0.3 mL IJ SOAJ injection as needed.    . fluticasone (FLOVENT HFA) 110 MCG/ACT inhaler Inhale 2 puffs 2 (two) times daily into the lungs. 1 Inhaler 3  . ipratropium (ATROVENT) 0.03 % nasal spray Place 2 sprays into both nostrils 2 (two) times daily.    Vladimir Faster Glycol-Propyl Glycol (SYSTANE OP) Place 1 drop into both eyes 2 (two) times daily.    . primidone (MYSOLINE) 50 MG tablet Take 2 tablets (100 mg total) by mouth 3 (three) times daily. 540 tablet 4  . triamcinolone cream (KENALOG) 0.1 % Apply 1 application topically 2 (two) times daily as needed (FOR RASH).     [name] venlafaxine (EFFEXOR) 37.5 MG tablet Take 1 tablet (37.5 mg total) by mouth daily. 90 tablet 3  . pregabalin (LYRICA) 50 MG capsule Take 1 capsule (50 mg total) by mouth 2 (two) times daily. X 1 week then 50 mg in AM and 100 mg nightly- for nerve pain 90 capsule 3   No facility-administered medications  prior to visit.     PAST MEDICAL HISTORY: Past Medical History:  Diagnosis Date  . Asthma    daily inhaler  . Breast cancer ([place])    left- radiation and surgery -dx. 2011- no further tx. now- [name] , [name]  . Cyst of finger 11/2011   annular cyst right long finger  . Dental crowns present   . Dermatitis   . Frequency of urination   . GERD (gastroesophageal reflux disease)   . Hemorrhoid   . Hyperlipidemia   . Hypertension    under control, has been on med. x 2 yrs.  [name] PONV (postoperative nausea and vomiting)   . Seasonal allergies   . Tremors of nervous system    hands  . Trigger finger of right hand 11/2011   long finger    PAST SURGICAL HISTORY: Past Surgical History:  Procedure  Laterality Date  . ANTERIOR CERVICAL DECOMPRESSION/DISCECTOMY FUSION 4 LEVELS N/A 11/14/2013   Procedure: ANTERIOR CERVICAL DECOMPRESSION/DISCECTOMY FUSION 4 LEVELS;  Surgeon: Newman Pies, MD;  Location: [facility] [place];  Service: Neurosurgery;  Laterality: N/A;  C34 C45 C56 C67 anterior cervical fusion with interbody prosthesis plating and  bonegraft  . APPENDECTOMY  age 29  . BREAST LUMPECTOMY  06/16/2009   left; SLN bx.  [name] BREAST LUMPECTOMY  07/01/2009   re-excision  . BREAST SURGERY  1999   reduction  . CATARACT EXTRACTION, BILATERAL    . COLONOSCOPY WITH PROPOFOL N/A 12/03/2014   Procedure: COLONOSCOPY WITH PROPOFOL;  Surgeon: [name], MD;  Location: [facility];  Service: Endoscopy;  Laterality: N/A;  . FOOT SURGERY  06/22/11   left  . KNEE ARTHROSCOPY  03/12/2005   right  . KNEE ARTHROSCOPY     left  . NASAL SINUS SURGERY     x 2  . 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 TRIGGER FINGER RELEASE  12/16/2011   Procedure: RELEASE TRIGGER FINGER/A-1 PULLEY;  Surgeon: Tennis Must, MD;  Location: [place];  Service: Orthopedics;  Laterality: Right;  RIGHT LONG FINGER TRIGGER RELEASE & ANNULAR CYST EXCISION  . TUMOR  EXCISION  age 80   right arm    FAMILY HISTORY: Family History  Problem Relation Age of Onset  . Kidney disease Mother   . Stroke Father   . Stroke Sister        08/2015  . Diabetes Brother   . Heart disease Brother   . Ovarian cancer Sister     SOCIAL HISTORY:  Social History   Socioeconomic History  . Marital status: Married    Spouse name: Marcello Moores   . Number of children: 1  . Years of education: HS  . Highest education level: Not on file  Occupational History  . Occupation: Retired  Scientific laboratory technician  . Financial resource strain: Not on file  . Food insecurity    Worry: Not on file    Inability: Not on file  . Transportation needs    Medical: Not on file    Non-medical: Not on file  Tobacco Use  . Smoking status: Never Smoker  . Smokeless tobacco: Never Used  Substance and Sexual Activity  . Alcohol use: No    Alcohol/week: 0.0 standard drinks  . Drug use: No  . Sexual activity: Yes  Lifestyle  . Physical activity    Days per week: Not on file    Minutes per session: Not on file  . Stress: Not on file  Relationships  . Social Herbalist on phone: Not on file    Gets together: Not on file    Attends religious service: Not on file    Active member of club or organization: Not on file    Attends meetings of clubs or organizations: Not on file    Relationship status: Not on file  . Intimate partner violence    Fear of current or ex partner: Not on file    Emotionally abused: Not on file    Physically abused: Not on file    Forced sexual activity: Not on file  Other Topics Concern  . Not on file  Social History Narrative      Pt lives at home with her spouse.  [name]    Patient has 1 child.    Patient is retired.  Patient has a HS education.    Patient is right handed.    Patient drinks caffeine occasionally.               PHYSICAL EXAM  Vitals:   01/09/19 1003  BP: 134/87  Pulse: 88  Temp: 97.8 F (36.6 C)  Weight: 127 lb 12.8  oz (58 kg)  Height: 5\' 1"  (1.549 m)   Body mass index is 24.15 kg/m.  GENERAL EXAM: Patient is in no distress  CARDIOVASCULAR: Regular rate and rhythm, no murmurs, no carotid bruits  NEUROLOGIC: MENTAL STATUS: awake, alert, language fluent, comprehension intact, naming intact; MASKED FACIES; NEG FRONTAL RELEASE SIGNS MMSE - Mini Mental State Exam 05/23/2017  Orientation to time 5  Orientation to Place 5  Registration 3  Attention/ Calculation 1  Recall 2  Language- name 2 objects 2  Language- repeat 1  Language- follow 3 step command 3  Language- read & follow direction 1  Write a sentence 1  Copy design 1  Total score 25    CRANIAL NERVE: pupils equal and reactive to light, visual fields full to confrontation, extraocular muscles intact, no nystagmus, facial sensation and strength symmetric, uvula midline, shoulder shrug symmetric, tongue midline; SOFT HOARSE VOICE MOTOR: normal bulk, POSTURAL AND ACTION TREMOR; INT REST TREMOR IN RUE > LUE; INT MOUTH TREMOR; BRADYKINESIA IN LUE >> RUE; AND BLE; MILD COGWHEELING IN RUE > LUE; full strength in the BUE, BLE; VOICE TREMOR SENSORY: normal and symmetric to light touch COORDINATION: finger-nose-finger, fine finger movements normal REFLEXES: deep tendon reflexes present and symmetric GAIT/STATION: narrow based gait; SLOW AND CAUTIOUS, DECR RIGHT ARM SWING    DIAGNOSTIC DATA (LABS, IMAGING, TESTING) - I reviewed patient records, labs, notes, testing and imaging myself where available.  Lab Results  Component Value Date   WBC 5.2 01/20/2015   HGB 12.0 01/20/2015   HCT 36.1 01/20/2015   MCV 88.4 01/20/2015   PLT 293 01/20/2015      Component Value Date/Time   NA 136 01/20/2015 1326   K 4.1 01/20/2015 1326   CL 100 11/06/2013 1032   CL 102 06/30/2012 1231   CO2 27 01/20/2015 1326   GLUCOSE 66 (L) 01/20/2015 1326   GLUCOSE 77 06/30/2012 1231   BUN 13.9 01/20/2015 1326   CREATININE 0.9 01/20/2015 1326   CALCIUM 8.8  01/20/2015 1326   PROT 7.4 01/20/2015 1326   ALBUMIN 3.6 01/20/2015 1326   AST 17 01/20/2015 1326   ALT 13 01/20/2015 1326   ALKPHOS 86 01/20/2015 1326   BILITOT <0.30 01/20/2015 1326   GFRNONAA 83 (L) 11/06/2013 1032   GFRAA >90 11/06/2013 1032   No results found for: CHOL No results found for: HGBA1C No results found for: VITAMINB12 No results found for: TSH   MRI brain - unremarkable per patient (done ~ early 2000's)  06/25/13 EMG/NCS 1. 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 2. No evidence of widespread underlying large fiber neuropathy.  11/14/13 CXR [I reviewed images myself and agree with interpretation. -VRP]  - Anterior cervical disc fusion localization images as described.    ASSESSMENT AND PLAN  78 y.o. year old female  has a past medical history of Asthma, Breast cancer ([place]), Cyst of finger (11/2011), Dental crowns present, Dermatitis, Frequency of urination, GERD (gastroesophageal reflux disease), Hemorrhoid, Hyperlipidemia, Hypertension, PONV (postoperative nausea and vomiting), Seasonal allergies, Tremors of nervous system, and Trigger finger  of right hand (11/2011). here with essential tremor since 1990's. Now with intermittent resting tremor AND bradykinesia since 2016. May represent essential tremor with superimposed parkinson's disease + cervical myelopathy sequelae. DATscan ordered but denied (to confirm superimposed parkinsonism on top of essential tremor and cervical myelopathy).  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 carbidopa-levodopa (SINEMET IR) 25-100 MG tablet    Sig: Take 2 tablets by mouth 3 (three) times daily before meals.    Dispense:  540 tablet    Refill:  4  . primidone (MYSOLINE) 50 MG tablet    Sig: Take 2 tablets (100 mg total) by mouth 3 (three) times daily.    Dispense:  540 tablet    Refill:  4   Return in about 9 months (around 10/09/2019).     Penni Bombard, MD [id number], [id number] AM Certified in Neurology, Neurophysiology and Neuroimaging  [facility] [address], [place], [place] [address] [phone]

## 2019-01-10 ENCOUNTER — Other Ambulatory Visit: Payer: Self-pay

## 2019-01-10 ENCOUNTER — Encounter: Payer: Self-pay | Admitting: Physical Therapy

## 2019-01-10 ENCOUNTER — Ambulatory Visit: Payer: Medicare Other | Admitting: Physical Therapy

## 2019-01-10 DIAGNOSIS — M6281 Muscle weakness (generalized): Secondary | ICD-10-CM | POA: Diagnosis not present

## 2019-01-10 DIAGNOSIS — R2681 Unsteadiness on feet: Secondary | ICD-10-CM | POA: Diagnosis not present

## 2019-01-10 DIAGNOSIS — R293 Abnormal posture: Secondary | ICD-10-CM | POA: Diagnosis not present

## 2019-01-10 DIAGNOSIS — R1310 Dysphagia, unspecified: Secondary | ICD-10-CM | POA: Diagnosis not present

## 2019-01-10 DIAGNOSIS — M5416 Radiculopathy, lumbar region: Secondary | ICD-10-CM | POA: Diagnosis not present

## 2019-01-10 DIAGNOSIS — R2689 Other abnormalities of gait and mobility: Secondary | ICD-10-CM | POA: Diagnosis not present

## 2019-01-11 NOTE — Therapy (Signed)
[facility] [address] [place], [place], [address] Phone: [phone]   Fax:  [phone]  Physical Therapy Treatment  Patient Details  Name: April Church MRN: [id number] Date of Birth: 17-Nov-1940 Referring Provider (PT): Dagoberto Ligas, [place]   Encounter Date: 01/10/2019  PT End of Session - 01/10/19 1452    Visit Number  2    Number of Visits  13    Date for PT Re-Evaluation  04/04/19    Authorization Type  Medicare/BCBS-Will need 10th visit progress note    PT Start Time  1447    PT Stop Time  1528    PT Time Calculation (min)  41 min    Activity Tolerance  Patient tolerated treatment well;No increased pain    Behavior During Therapy  WFL for tasks assessed/performed       Past Medical History:  Diagnosis Date  . Asthma    daily inhaler  . Breast cancer ([place])    left- radiation and surgery -dx. 2011- no further tx. now- [name] , [name]  . Cyst of finger 11/2011   annular cyst right long finger  . Dental crowns present   . Dermatitis   . Frequency of urination   . GERD (gastroesophageal reflux disease)   . Hemorrhoid   . Hyperlipidemia   . Hypertension    under control, has been on med. x 2 yrs.  [name] PONV (postoperative nausea and vomiting)   . Seasonal allergies   . Tremors of nervous system    hands  . Trigger finger of right hand 11/2011   long finger    Past Surgical History:  Procedure Laterality Date  . ANTERIOR CERVICAL DECOMPRESSION/DISCECTOMY FUSION 4 LEVELS N/A 11/14/2013   Procedure: ANTERIOR CERVICAL DECOMPRESSION/DISCECTOMY FUSION 4 LEVELS;  Surgeon: Newman Pies, MD;  Location: [facility];  Service: Neurosurgery;  Laterality: N/A;  C34 C45 C56 C67 anterior cervical fusion with interbody prosthesis plating and  bonegraft  . APPENDECTOMY  age 24  . BREAST LUMPECTOMY  06/16/2009   left; SLN bx.  [name] BREAST LUMPECTOMY  07/01/2009   re-excision  . BREAST SURGERY  1999   reduction  . CATARACT EXTRACTION, BILATERAL    . COLONOSCOPY WITH PROPOFOL N/A 12/03/2014   Procedure: COLONOSCOPY WITH PROPOFOL;  Surgeon: [name], MD;  Location: [facility];  Service: Endoscopy;  Laterality: N/A;  . FOOT SURGERY  06/22/11   left  . KNEE ARTHROSCOPY  03/12/2005   right  . KNEE ARTHROSCOPY     left  . NASAL SINUS SURGERY     x 2  . 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 TRIGGER FINGER RELEASE  12/16/2011   Procedure: RELEASE TRIGGER FINGER/A-1 PULLEY;  Surgeon: Tennis Must, MD;  Location: [facility];  Service: Orthopedics;  Laterality: Right;  RIGHT LONG FINGER TRIGGER RELEASE & ANNULAR CYST EXCISION  . TUMOR EXCISION  age 35   right arm    There were no vitals filed for this visit.  Subjective Assessment - 01/10/19 1448    Subjective  No new complaints. Still with low back pain that goes into her right LE. Scheduled to have an injection on 02/02/19 at [facility] on [address]. No falls.    Pertinent History  Hx of Parkinson's disease; donated kidney to husband; hx of breast cancer    How long can you walk comfortably?  [place]  [place] and back; and that's the most she can do before leg pain limits    Diagnostic tests  MRI 09/2018, shows multi leve disc bulge lumbar spine    Patient Stated Goals  Pt's goal for therapy is to be more sure of balance and have less pain.    Currently in Pain?  Yes    Pain Score  4     Pain Location  Back    Pain Orientation  Right;Left;Lower    Pain Descriptors / Indicators  Aching    Pain Type  Chronic pain    Pain Radiating Towards  right LE all the way down    Pain Onset  More than a month ago    Pain Frequency  Intermittent    Aggravating Factors   worst in am when she gets up, prolonged sitting    Pain Relieving Factors  changing positions, Tylenol help some             [facility] Adult PT Treatment/Exercise - 01/10/19 1453       Standardized Balance Assessment   Standardized Balance Assessment  Dynamic Gait Index      Dynamic Gait Index   Level Surface  Normal    Change in Gait Speed  Mild Impairment   minor change in speed   Gait with Horizontal Head Turns  Mild Impairment    Gait with Vertical Head Turns  Mild Impairment    Gait and Pivot Turn  Normal    Step Over Obstacle  Mild Impairment    Step Around Obstacles  Normal      Exercises   Exercises  Other Exercises    Other Exercises   hookling on mat table: single knee to chest stretch fro 30 sec holds x 3 each side with cues on technique/hold times; lower trunk rotation stretch for 30 sec holds for 3 reps each way with cues on techique/hold times; seated at edge of mat for hamsting stretch for 30 sec holds for 3 reps each side with cues on form/technique.       Manual Therapy   Manual therapy comments  sheet traction for 60 sec holds for 6 reps with reported decrease in pain; then right LE distraction for 30 sec holds x 4 reps with decreased pain and tightness reported.                PT Short Term Goals - 01/04/19 1602      PT SHORT TERM GOAL #1   Title  Pt will be independent with HEP for decreased pain and improved functional strength and gait.  TARGET 02/02/2019 (may be delayed due to scheduling around holidays)    Time  4    Period  Weeks    Status  New    Target Date  02/02/19      PT SHORT TERM GOAL #2   Title  Pt will improve 5x sit<>stand to less than or equal to 17 seconds for improved functional lower extremity strength.    Baseline  5x sit<>stand at eval 20.75 sec    Time  4    Period  Weeks    Status  New    Target Date  02/02/19      PT SHORT TERM GOAL #3   Title  Pt will improve R hamstring ROM by at least 10 degrees for improved flexibility, decreased RLE pain.    Baseline  40 degrees from knee extension in 90/90 supine position  Time  4    Period  Weeks    Status  New    Target Date  02/02/19      PT SHORT TERM  GOAL #4   Title  Pt will be able to ambulate > 5 minutes x 2 (simulating her neighborhood) without pain increase in low back and legs.    Baseline  able to walk 5 minutes, take break 5 additional minutes prior to needing to stop due to pain    Time  4    Period  Weeks    Status  New    Target Date  02/02/19      PT SHORT TERM GOAL #5   Title  Pt will verbalize understanding of posture/positioning with ADLs and sleep to decrease back pain with funcitonal activities.    Time  4    Period  Weeks    Status  New    Target Date  02/02/19        PT Long Term Goals - 01/04/19 1607      PT LONG TERM GOAL #1   Title  Pt will be independent with progression of HEP for decreased pain, improved mobility, strength, balance.  02/22/2019, 6 weeks for all LTGs    Time  6    Period  Weeks    Status  New      PT LONG TERM GOAL #2   Title  FGA to be assessed, with goal to be written as apporpriate.    Time  6    Period  Weeks    Status  New      PT LONG TERM GOAL #3   Title  Pt will improve 5x sit<>stand to less than or equal to 15 seconds for improved functional lower extremity strength.    Time  6    Period  Weeks    Status  New      PT LONG TERM GOAL #4   Title  Pt will improve gait velocity to at least 3 ft/sec for improved gait efficiency and safety in community.    Time  6    Period  Weeks    Status  New      PT LONG TERM GOAL #5   Title  Pt will report at least 10 percentage point decrease in Oswestry Back pain score, to demonstrate improved low back pain with funcitonal mobility and daily tasks.    Baseline  Eval:  Oswestry 32%    Period  Weeks    Status  New            Plan - 01/10/19 1452    Clinical Impression Statement  Today's skilled session initially focused on setting baseline value for dynamic gait index, however unable to get to stairs to complete the test. Will plan to complete test at next session. Remainder of session focused on pain reduction and LE  stretching with pt reporting pain at 1/10 after session. The pt is progressing toward goals and should benefit from continued PT to progress toward unmet goals.    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 transplant    Examination-Activity Limitations  Bed Mobility;Transfers;Sit;Stand;Locomotion Level    Examination-Participation Restrictions  Meal Prep;Community Activity   Walking in neighborhood   Stability/Clinical Decision Making  Evolving/Moderate complexity    Rehab Potential  Good    PT Frequency  2x / week  PT Duration  6 weeks   plus eval   PT Treatment/Interventions  ADLs/Self Care Home Management;Electrical Stimulation;Ultrasound;Traction;Moist Heat;Gait training;Functional mobility training;Therapeutic activities;Balance training;Therapeutic exercise;Neuromuscular re-education;Patient/family education    PT Next Visit Plan  complete DGI (stairs left); continue to work on extension based activities for decreased pain, try prone. continue to work on stretching and core strengthening as well.    Consulted and Agree with Plan of Care  Patient       Patient will benefit from skilled therapeutic intervention in order to improve the following deficits and impairments:  Abnormal gait, Decreased mobility, Decreased balance, Difficulty walking, Decreased strength, Postural dysfunction, Impaired flexibility  Visit Diagnosis: Radiculopathy, lumbar region  Abnormal posture  Muscle weakness (generalized)  Other abnormalities of gait and mobility  Unsteadiness on feet     Problem List Patient Active Problem List   Diagnosis Date Noted  . Lumbar radiculopathy 12/08/2018  . Myofascial pain 12/08/2018  . Impaired gait and mobility 12/08/2018  . 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 Chronic nonallergic rhinitis 12/29/2016  . Mild persistent asthma, uncomplicated [id number]  . 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 Gastroesophageal reflux disease 12/29/2016  . Cervical spondylosis with myelopathy and radiculopathy 11/14/2013  . Essential tremor 06/13/2013  . Cervical spondylosis without myelopathy 06/13/2013  . Breast cancer of lower-outer quadrant of left female breast ([facility]) 09/10/2010    Willow Ora, PTA, [facility] [address] [place], [place] [address] [phone] 01/11/19, 10:32 PM   Name: April Church MRN: [id number] Date of Birth: 10/14/1940

## 2019-01-12 ENCOUNTER — Other Ambulatory Visit: Payer: Self-pay

## 2019-01-12 ENCOUNTER — Encounter: Payer: Self-pay | Admitting: Family Medicine

## 2019-01-12 ENCOUNTER — Ambulatory Visit (INDEPENDENT_AMBULATORY_CARE_PROVIDER_SITE_OTHER): Payer: Medicare Other | Admitting: Family Medicine

## 2019-01-12 VITALS — BP 124/88 | HR 104 | Temp 98.2°F | Resp 20 | Ht 60.0 in | Wt 125.0 lb

## 2019-01-12 DIAGNOSIS — K219 Gastro-esophageal reflux disease without esophagitis: Secondary | ICD-10-CM

## 2019-01-12 DIAGNOSIS — J3802 Paralysis of vocal cords and larynx, bilateral: Secondary | ICD-10-CM | POA: Diagnosis not present

## 2019-01-12 DIAGNOSIS — T782XXD Anaphylactic shock, unspecified, subsequent encounter: Secondary | ICD-10-CM | POA: Diagnosis not present

## 2019-01-12 DIAGNOSIS — J4540 Moderate persistent asthma, uncomplicated: Secondary | ICD-10-CM | POA: Diagnosis not present

## 2019-01-12 DIAGNOSIS — J310 Chronic rhinitis: Secondary | ICD-10-CM | POA: Diagnosis not present

## 2019-01-12 MED ORDER — ALBUTEROL SULFATE HFA 108 (90 BASE) MCG/ACT IN AERS: 1.0000 | INHALATION_SPRAY | Freq: Four times a day (QID) | RESPIRATORY_TRACT | 1 refills | Status: DC | PRN

## 2019-01-12 MED ORDER — IPRATROPIUM BROMIDE 0.03 % NA SOLN: 2.0000 | Freq: Two times a day (BID) | NASAL | 5 refills | Status: DC

## 2019-01-12 MED ORDER — BUDESONIDE-FORMOTEROL FUMARATE 160-4.5 MCG/ACT IN AERO: 2.0000 | INHALATION_SPRAY | Freq: Two times a day (BID) | RESPIRATORY_TRACT | 5 refills | Status: DC

## 2019-01-12 MED ORDER — EPINEPHRINE 0.3 MG/0.3ML IJ SOAJ: 0.3000 mg | INTRAMUSCULAR | 1 refills | Status: DC | PRN

## 2019-01-12 MED ORDER — FAMOTIDINE 20 MG PO TABS: 20.0000 mg | ORAL_TABLET | Freq: Every day | ORAL | 5 refills | Status: DC

## 2019-01-12 MED ORDER — LEVOCETIRIZINE DIHYDROCHLORIDE 5 MG PO TABS: 5.0000 mg | ORAL_TABLET | Freq: Every evening | ORAL | 5 refills | Status: DC

## 2019-01-12 NOTE — Progress Notes (Signed)
[facility] [place] [address] Dept: [phone]  FOLLOW UP NOTE  Patient ID: April Church, female    DOB: [birth year]  Age: 78 y.o. MRN: [id number] Date of Office Visit: 01/12/2019  Assessment  Chief Complaint: Allergies (runny nose. sneezing very bad first thing in the morning. ongoing for 2-3 months. )  HPI April Church is a 78 year old female who presents to the clinic for a follow up visit. She was last seen in the clinic on 06/29/2017 by Dr. Nelva Bush for evaluation of asthma, allergic rhinitis, reflux, vocal cord paralysis on botox injections, and food allergies to shellfish and wine.  At today's visit, she reports that for the last 2 months she has been experiencing clear rhinorrhea and sneezing that occurs every morning.  She reports occasional post nasal drainage.  She has currently tried Zyrtec and Allegra and neither are helping to relieve symptoms.  She is using azelastine at this time.  Asthma is reported as well controlled with occasional shortness of breath and wheezing for which he uses albuterol about once a month with relief of symptoms.  She reports occasional dry cough occurring mostly in the morning.  She continues Symbicort 2 puffs twice a day with a spacer.  Reflux is reported as well controlled with Prevacid once a day.  She continues to avoid shellfish and wine with no accidental ingestions since her last visit to this clinic.  She has access to an Auvi-Q that expired in 2018.  Her current medications are listed in the chart.   Drug Allergies:  Allergies  Allergen Reactions  . Shellfish Allergy Shortness Of Breath  . Duloxetine Itching and Rash  . Sulfa Drugs Cross Reactors Anxiety and Rash  . Lyrica [Pregabalin] Other (See Comments)    "made me drunk, couldn't function"  . Codeine Anxiety    Physical Exam: BP 124/88 (BP Location: Right Arm, Patient Position: Sitting, Cuff Size: Normal)   Pulse (!) 104   Temp 98.2 F (36.8 C)  (Temporal)   Resp 20   Ht 5' (1.524 m)   Wt 125 lb (56.7 kg)   SpO2 98%   BMI 24.41 kg/m    Physical Exam Vitals signs reviewed.  Constitutional:      Appearance: Normal appearance.  HENT:     Head: Normocephalic and atraumatic.     Right Ear: Tympanic membrane normal.     Left Ear: Tympanic membrane normal.     Nose:     Comments: Bilateral nares slightly erythematous with clear nasal drainage noted.  Pharynx normal.  Ears normal.  Eyes normal.    Mouth/Throat:     Pharynx: Oropharynx is clear.  Eyes:     Conjunctiva/sclera: Conjunctivae normal.  Neck:     Musculoskeletal: Normal range of motion and neck supple.  Cardiovascular:     Rate and Rhythm: Normal rate and regular rhythm.     Heart sounds: Normal heart sounds. No murmur.  Pulmonary:     Effort: Pulmonary effort is normal.     Breath sounds: Normal breath sounds.     Comments: Lungs clear to auscultation Musculoskeletal: Normal range of motion.  Skin:    General: Skin is warm and dry.  Neurological:     Mental Status: She is alert and oriented to person, place, and time.  Psychiatric:        Mood and Affect: Mood normal.        Behavior: Behavior normal.  Thought Content: Thought content normal.        Judgment: Judgment normal.     Diagnostics: FVC 1.54, FEV1 1.24.  Predicted FVC 2.16, predicted FEV1 1.59.  Spirometry indicates mild restriction.  This is consistent with previous spirometry readings.  Assessment and Plan: 1. Moderate persistent asthma without complication   2. Chronic nonallergic rhinitis   3. Anaphylaxis, subsequent encounter   4. Vocal fold paralysis, bilateral   5. Gastroesophageal reflux disease, unspecified whether esophagitis present     Meds ordered this encounter  Medications  . levocetirizine (XYZAL) 5 MG tablet    Sig: Take 1 tablet (5 mg total) by mouth every evening.    Dispense:  30 tablet    Refill:  5  . EPINEPHrine 0.3 mg/0.3 mL IJ SOAJ injection    Sig:  Inject 0.3 mLs (0.3 mg total) into the muscle as needed.    Dispense:  2 each    Refill:  1  . budesonide-formoterol (SYMBICORT) 160-4.5 MCG/ACT inhaler    Sig: Inhale 2 puffs into the lungs 2 (two) times daily.    Dispense:  1 Inhaler    Refill:  5  . ipratropium (ATROVENT) 0.03 % nasal spray    Sig: Place 2 sprays into both nostrils 2 (two) times daily.    Dispense:  30 mL    Refill:  5  . albuterol (VENTOLIN HFA) 108 (90 Base) MCG/ACT inhaler    Sig: Inhale 1 puff into the lungs every 6 (six) hours as needed for wheezing or shortness of breath.    Dispense:  18 g    Refill:  1  . famotidine (PEPCID) 20 MG tablet    Sig: Take 1 tablet (20 mg total) by mouth daily.    Dispense:  30 tablet    Refill:  5    Patient Instructions  Anaphylaxis, subsequent encounter Continue avoidance of shellfish and wine Have access to self injectable epinephrine in case of allergic reaction.  Follow emergency action plan in case of allergic reaction  Chronic nonallergic rhinitis Begin Xyzal 5 mg once a day as needed for a runny nose. Remember to rotate to a different antihistamine about every 3 months. Some examples of over the counter antihistamines include Zyrtec (cetirizine), Xyzal (levocetirizine), Allegra (fexofenadine), and Claritin (loratidine).  Begin Atrovent 0.3% use 2 sprays in each nostril twice a day as needed for a runny nose Continue Astelin and increase to 2 sprays in each nostril in the AM and 2 sprays in each nostril in the PM.   Mild persistent asthma, uncomplicated Continue Symbicort 135mcg  2puffs in the AM  and 2 puffs in the PM Continue Proventil HFA 2 puff into the lungs every 4-6 hours as needed for shortness of breath or wheezing  Vocal fold paralysis, bilateral Continue routine follow-up with Dr. Joya Gaskins with ENT for periodic Botox injections  GERD Continue Pepcid once a day as you have been Continue dietary and lifestyle modifications as listed below  Follow up in 6  months or sooner if needed    Return in about 6 months (around 07/12/2019), or if symptoms worsen or fail to improve.    Thank you for the opportunity to care for this patient.  Please do not hesitate to contact me with questions.  Gareth Morgan, FNP [facility]

## 2019-01-12 NOTE — Patient Instructions (Addendum)
Anaphylaxis, subsequent encounter Continue avoidance of shellfish and wine Have access to self injectable epinephrine in case of allergic reaction.  Follow emergency action plan in case of allergic reaction  Chronic nonallergic rhinitis Begin Xyzal 5 mg once a day as needed for a runny nose. Remember to rotate to a different antihistamine about every 3 months. Some examples of over the counter antihistamines include Zyrtec (cetirizine), Xyzal (levocetirizine), Allegra (fexofenadine), and Claritin (loratidine).  Begin Atrovent 0.3% use 2 sprays in each nostril twice a day as needed for a runny nose Continue Astelin and increase to 2 sprays in each nostril in the AM and 2 sprays in each nostril in the PM.   Mild persistent asthma, uncomplicated Continue Symbicort 178mcg  2puffs in the AM  and 2 puffs in the PM Continue Proventil HFA 2 puff into the lungs every 4-6 hours as needed for shortness of breath or wheezing  Vocal fold paralysis, bilateral Continue routine follow-up with Dr. Joya Gaskins with ENT for periodic Botox injections  GERD Continue Pepcid once a day as you have been Continue dietary and lifestyle modifications as listed below  Follow up in 6 months or sooner if needed    Lifestyle Changes for Controlling GERD When you have GERD, stomach acid feels as if it's backing up toward your mouth. Whether or not you take medication to control your GERD, your symptoms can often be improved with lifestyle changes.   Raise Your Head  Reflux is more likely to strike when you're lying down flat, because stomach fluid can  flow backward more easily. Raising the head of your bed 4-6 inches can help. To do this:  Slide blocks or books under the legs at the head of your bed. Or, place a wedge under  the mattress. Many foam stores can make a suitable wedge for you. The wedge  should run from your waist to the top of your head.  Don't just prop your head on several pillows. This  increases pressure on your  stomach. It can make GERD worse.  Watch Your Eating Habits Certain foods may increase the acid in your stomach or relax the lower esophageal sphincter, making GERD more likely. It's best to avoid the following:  Coffee, tea, and carbonated drinks (with and without caffeine)  Fatty, fried, or spicy food  Mint, chocolate, onions, and tomatoes  Any other foods that seem to irritate your stomach or cause you pain  Relieve the Pressure  Eat smaller meals, even if you have to eat more often.  Don't lie down right after you eat. Wait a few hours for your stomach to empty.  Avoid tight belts and tight-fitting clothes.  Lose excess weight.  Tobacco and Alcohol  Avoid smoking tobacco and drinking alcohol. They can make GERD symptoms worse.

## 2019-01-19 ENCOUNTER — Other Ambulatory Visit: Payer: Self-pay | Admitting: Adult Health

## 2019-01-19 ENCOUNTER — Encounter: Payer: Self-pay | Admitting: Adult Health

## 2019-01-19 ENCOUNTER — Other Ambulatory Visit: Payer: Self-pay

## 2019-01-19 ENCOUNTER — Inpatient Hospital Stay: Payer: Medicare Other | Attending: Adult Health | Admitting: Adult Health

## 2019-01-19 ENCOUNTER — Telehealth: Payer: Self-pay | Admitting: Adult Health

## 2019-01-19 VITALS — BP 148/91 | HR 93 | Temp 98.7°F | Resp 16 | Ht 60.0 in | Wt 126.5 lb

## 2019-01-19 DIAGNOSIS — C50512 Malignant neoplasm of lower-outer quadrant of left female breast: Secondary | ICD-10-CM | POA: Insufficient documentation

## 2019-01-19 DIAGNOSIS — K219 Gastro-esophageal reflux disease without esophagitis: Secondary | ICD-10-CM | POA: Diagnosis not present

## 2019-01-19 DIAGNOSIS — J45909 Unspecified asthma, uncomplicated: Secondary | ICD-10-CM | POA: Diagnosis not present

## 2019-01-19 DIAGNOSIS — Z7982 Long term (current) use of aspirin: Secondary | ICD-10-CM | POA: Insufficient documentation

## 2019-01-19 DIAGNOSIS — Z853 Personal history of malignant neoplasm of breast: Secondary | ICD-10-CM

## 2019-01-19 DIAGNOSIS — Z170 Estrogen receptor positive status [ER+]: Secondary | ICD-10-CM | POA: Insufficient documentation

## 2019-01-19 DIAGNOSIS — Z923 Personal history of irradiation: Secondary | ICD-10-CM | POA: Insufficient documentation

## 2019-01-19 DIAGNOSIS — E785 Hyperlipidemia, unspecified: Secondary | ICD-10-CM | POA: Diagnosis not present

## 2019-01-19 DIAGNOSIS — I10 Essential (primary) hypertension: Secondary | ICD-10-CM | POA: Diagnosis not present

## 2019-01-19 DIAGNOSIS — Z79811 Long term (current) use of aromatase inhibitors: Secondary | ICD-10-CM | POA: Diagnosis not present

## 2019-01-19 DIAGNOSIS — M549 Dorsalgia, unspecified: Secondary | ICD-10-CM | POA: Diagnosis not present

## 2019-01-19 DIAGNOSIS — Z79899 Other long term (current) drug therapy: Secondary | ICD-10-CM | POA: Insufficient documentation

## 2019-01-19 DIAGNOSIS — Z1379 Encounter for other screening for genetic and chromosomal anomalies: Secondary | ICD-10-CM

## 2019-01-19 NOTE — Telephone Encounter (Signed)
Spoke with patient to inform that a referral has been placed for genetic testing and that scheduling will be reaching out to her for appointment.  Patient voiced understanding and thanks for call.

## 2019-01-19 NOTE — Progress Notes (Signed)
CLINIC:  Survivorship   REASON FOR VISIT:  Routine follow-up for history of breast cancer.   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   07/23/2009 - 09/03/2009 Radiation Therapy   Adjuvant radiation therapy   10/13/2009 - 10/15/2013 Anti-estrogen oral therapy   Initially letrozole 2.5 mg daily 2 years then switched to tamoxifen then once again switched the Arimidex October 2013      INTERVAL HISTORY:  Ms. Sher presents to the [facility] today for routine follow-up for her history of breast cancer.  Overall, she reports feeling quite well.  She underwent mammogram at [facility] that was normal.  She is happy with this report.  She notes she is up to date with PCP visits, and f/u with Dr. Ronita Hipps.  She has FH of sister with ovarian cancer, that happened about 10 years ago.  Jya was referred to genetic testing at her breast cancer diagnosis, however, the counselor said that she didn't meet criteria at the time.  Annually she and her daughter undergo transvaginal/abdominal ultrasound for surveillance.    Her activity level is decreased due to increased back pain.  She is seeing pain specialist for this and is going to undergo an injection on 02/02/2019.  She sees Dr. Dagoberto Ligas for her pain.      REVIEW OF SYSTEMS:  Review of Systems  Constitutional: Negative for appetite change, chills, fatigue, fever and unexpected weight change.  HENT:   Negative for hearing loss, lump/mass, sore throat and trouble swallowing.   Eyes: Negative for eye problems and icterus.  Respiratory: Negative for chest tightness, cough and shortness of breath.   Cardiovascular: Negative for chest pain, leg swelling and palpitations.  Gastrointestinal: Negative for abdominal distention, abdominal pain, constipation,  diarrhea, nausea and vomiting.  Endocrine: Negative for hot flashes.  Genitourinary: Negative for difficulty urinating.   Musculoskeletal: Positive for back pain. Negative for arthralgias.  Skin: Negative for itching and rash.  Neurological: Negative for dizziness, extremity weakness and numbness.  Hematological: Negative for adenopathy. Does not bruise/bleed easily.  Psychiatric/Behavioral: Negative for depression. The patient is not nervous/anxious.   Breast: Denies any new nodularity, masses, tenderness, nipple changes, or nipple discharge.       PAST MEDICAL/SURGICAL HISTORY:  Past Medical History:  Diagnosis Date  . Asthma    daily inhaler  . Breast cancer ([place])    left- radiation and surgery -dx. 2011- no further tx. now- [name] , [name]  . Cyst of finger 11/2011   annular cyst right long finger  . Dental crowns present   . Dermatitis   . Frequency of urination   . GERD (gastroesophageal reflux disease)   . Hemorrhoid   . Hyperlipidemia   . Hypertension    under control, has been on med. x 2 yrs.  [name] PONV (postoperative nausea and vomiting)   . Seasonal allergies   . Tremors of nervous system    hands  . Trigger finger of right hand 11/2011   long finger   Past Surgical History:  Procedure Laterality Date  . ANTERIOR CERVICAL DECOMPRESSION/DISCECTOMY FUSION 4 LEVELS N/A 11/14/2013   Procedure: ANTERIOR CERVICAL DECOMPRESSION/DISCECTOMY FUSION 4 LEVELS;  Surgeon: Newman Pies, MD;  Location: [facility];  Service: Neurosurgery;  Laterality: N/A;  C34 C45 C56 C67 anterior cervical fusion with interbody prosthesis plating  and  bonegraft  . APPENDECTOMY  age 78  . BREAST LUMPECTOMY  06/16/2009   left; SLN bx.  [name] BREAST LUMPECTOMY  07/01/2009   re-excision  . BREAST SURGERY  1999   reduction  . CATARACT EXTRACTION, BILATERAL    . COLONOSCOPY WITH PROPOFOL N/A 12/03/2014   Procedure: COLONOSCOPY WITH PROPOFOL;  Surgeon: [name], MD;  Location: [facility];  Service: Endoscopy;  Laterality: N/A;  . FOOT SURGERY  06/22/11   left  . KNEE ARTHROSCOPY  03/12/2005   right  . KNEE ARTHROSCOPY     left  . NASAL SINUS SURGERY     x 2  . 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 TRIGGER FINGER RELEASE  12/16/2011   Procedure: RELEASE TRIGGER FINGER/A-1 PULLEY;  Surgeon: Tennis Must, MD;  Location: [facility];  Service: Orthopedics;  Laterality: Right;  RIGHT LONG FINGER TRIGGER RELEASE & ANNULAR CYST EXCISION  . TUMOR EXCISION  age 48   right arm     ALLERGIES:  Allergies  Allergen Reactions  . Shellfish Allergy Shortness Of Breath  . Duloxetine Itching and Rash  . Sulfa Drugs Cross Reactors Anxiety and Rash  . Lyrica [Pregabalin] Other (See Comments)    "made me drunk, couldn't function"  . Codeine Anxiety     CURRENT MEDICATIONS:  Outpatient Encounter Medications as of 01/19/2019  Medication Sig Note  . albuterol (VENTOLIN HFA) 108 (90 Base) MCG/ACT inhaler Inhale 1 puff into the lungs every 6 (six) hours as needed for wheezing or shortness of breath.   [name] amLODipine (NORVASC) 5 MG tablet Take 1 tablet (5 mg total) by mouth 2 (two) times daily. 06/27/2018: 06/27/18 taking one 10 mg tab daily  . aspirin 81 MG tablet Take 81 mg by mouth daily.     [name] atorvastatin (LIPITOR) 20 MG tablet Take 20 mg by mouth every other day.    [name] azelastine (ASTELIN) 0.1 % nasal spray Place 1-2 sprays into both nostrils 2 (two) times daily. Use in each nostril as directed   . budesonide-formoterol (SYMBICORT) 160-4.5 MCG/ACT inhaler Inhale 2 puffs into the lungs 2 (two) times daily.   [name] CALCIUM CARBONATE PO Take 1 tablet by mouth 2 (two) times daily.   . carbidopa-levodopa (SINEMET IR) 25-100 MG tablet Take 2 tablets by mouth 3 (three) times daily before meals.   . Cholecalciferol (VITAMIN D) 1000 UNITS capsule Take 1,000 Units by mouth 2 (two) times daily.    . Coenzyme Q10 (CO Q 10) 100 MG CAPS Take by  mouth.   . EPINEPHrine 0.3 mg/0.3 mL IJ SOAJ injection Inject 0.3 mLs (0.3 mg total) into the muscle as needed.   . famotidine (PEPCID) 20 MG tablet Take 1 tablet (20 mg total) by mouth daily.   . fluticasone (FLOVENT HFA) 110 MCG/ACT inhaler Inhale 2 puffs 2 (two) times daily into the lungs.   . hydrOXYzine (ATARAX/VISTARIL) 10 MG tablet Take 10 mg by mouth at bedtime.   [name] ipratropium (ATROVENT) 0.03 % nasal spray Place 2 sprays into both nostrils 2 (two) times daily.   [name] levocetirizine (XYZAL) 5 MG tablet Take 1 tablet (5 mg total) by mouth every evening.   Vladimir Faster Glycol-Propyl Glycol (SYSTANE OP) Place 1 drop into both eyes 2 (two) times daily.   . primidone (MYSOLINE) 50 MG tablet Take 2 tablets (100 mg total) by mouth 3 (three) times daily.   [name] triamcinolone cream (KENALOG) 0.1 %  Apply 1 application topically 2 (two) times daily as needed (FOR RASH).    [name] venlafaxine (EFFEXOR) 37.5 MG tablet Take 1 tablet (37.5 mg total) by mouth daily.    No facility-administered encounter medications on file as of 01/19/2019.      ONCOLOGIC FAMILY HISTORY:  Family History  Problem Relation Age of Onset  . Kidney disease Mother   . Stroke Father   . Stroke Sister        08/2015  . Diabetes Brother   . Heart disease Brother   . Ovarian cancer Sister       SOCIAL HISTORY:  OVELLA MANYGOATS is married and lives with her husband in [place], [place].  She has a daughter who lives in [place], [place].  Ms. Kimmer is currently retired.  She denies any current or history of tobacco, alcohol, or illicit drug use.  (updated 12/2018)   PHYSICAL EXAMINATION:  Vital Signs: Vitals:   01/19/19 1124  BP: (!) 148/91  Pulse: 93  Resp: 16  Temp: 98.7 F (37.1 C)  SpO2: 100%   Filed Weights   01/19/19 1124  Weight: 126 lb 8 oz (57.4 kg)   General: Well-nourished, well-appearing female in no acute distress.  Unaccompanied today.   HEENT: Head is normocephalic.  Pupils equal and  reactive to light. Conjunctivae clear without exudate.  Sclerae anicteric. Oral mucosa is pink, moist.  Oropharynx is pink without lesions or erythema.  Lymph: No cervical, supraclavicular, or infraclavicular lymphadenopathy noted on palpation.  Cardiovascular: Regular rate and rhythm.[name] Respiratory: Clear to auscultation bilaterally. Chest expansion symmetric; breathing non-labored.  Breast Exam:  -Left breast: swelling just underneath left nipple hard, nodular area of about 1cm--unchanged -Right breast: No appreciable masses on palpation. No skin redness, thickening, or peau d'orange appearance; no nipple retraction or nipple discharge;  -Axilla: No axillary adenopathy bilaterally.  GI: Abdomen soft and round; non-tender, non-distended. Bowel sounds normoactive. No hepatosplenomegaly.   GU: Deferred.  Neuro: No focal deficits. Steady gait.  Psych: Mood and affect normal and appropriate for situation.  MSK: No focal spinal tenderness to palpation, full range of motion in bilateral upper extremities Extremities: No edema. Skin: Warm and dry.  LABORATORY DATA:  None for this visit   DIAGNOSTIC IMAGING:  Most recent mammogram:   Normal, done at [facility] in 12/2017   ASSESSMENT AND PLAN:  Ms.. Lasure is a pleasant 78 y.o. female with history of Stage IA left breast invasive ductal carcinoma, ER+/PR+/HER2-, diagnosed in 05/2009, treated with lumpectomy, adjuvant radiation therapy, and anti-estrogen therapy with Letrozole x 2years followed by Tamoxifen, then Anastrozole completing therapy in 09/2013.  She presents to the Survivorship Clinic for surveillance and routine follow-up.   1. History of breast cancer:  Ms. Sedgwick is currently clinically and radiographically without evidence of disease or recurrence of breast cancer.  She will repeat mammogram on Monday, 01/22/2019 at [facility] and this is scheduled.  I will f/u with genetics on whether or not she is a candidate for testing.  We will see her  back in one year for LTS follow up.  I encouraged her to call me with any questions or concerns before her next visit at the cancer center, and I would be happy to see her sooner, if needed.    2. Bone health:  Given Ms. Levesque's age, history of breast cancer, she is at risk for bone demineralization. She was given education on specific food and activities to promote bone health.  I recommended she  f/u with her PCP regarding future bone density testing and management.  3. Cancer screening:  Due to Ms. Copenhaver's history and her age, she should receive screening for skin cancers, colon cancer, and gynecologic cancers. She was encouraged to follow-up with her PCP for appropriate cancer screenings.   4. Health maintenance and wellness promotion: Ms. Safley was encouraged to consume 5-7 servings of fruits and vegetables per day. She was also encouraged to engage in moderate to vigorous exercise for 30 minutes per day most days of the week. She was instructed to limit her alcohol consumption and continue to abstain from tobacco use.   Dispo:  -Return to cancer center in one year for lts follow up -Mammogram in 12/2018   A total of (20) minutes of face-to-face time was spent with this patient with greater than 50% of that time in counseling and care-coordination.   Gardenia Phlegm, NP Survivorship Program [facility] [phone]   Note: PRIMARY CARE PROVIDER Leeroy Cha, [place] [phone]

## 2019-01-19 NOTE — Telephone Encounter (Signed)
-----   Message from Gardenia Phlegm, NP sent at 01/19/2019 12:54 PM EST ----- Please place referral and schedule message and call patient with recommmendations for testing ----- Message ----- From: Clarene Essex, Counselor Sent: 01/19/2019  12:26 PM EST To: Gardenia Phlegm, NP  Yep! ----- Message ----- From: Gardenia Phlegm, NP Sent: 01/19/2019  12:13 PM EST To: Clarene Essex, Counselor  Patient sister had ovarian cancer, and has personal h/o breast cancer.  Is she eligible for genetic testing?

## 2019-01-22 ENCOUNTER — Telehealth: Payer: Self-pay | Admitting: Adult Health

## 2019-01-22 NOTE — Telephone Encounter (Signed)
I talk with patient regarding schedule  

## 2019-01-22 NOTE — Telephone Encounter (Signed)
Confirmed 12/9 appointment with patient.

## 2019-01-25 ENCOUNTER — Ambulatory Visit: Payer: Medicare Other | Admitting: Occupational Therapy

## 2019-01-25 ENCOUNTER — Ambulatory Visit: Payer: Medicare Other | Admitting: Physical Therapy

## 2019-01-30 ENCOUNTER — Telehealth: Payer: Self-pay | Admitting: Genetic Counselor

## 2019-01-30 NOTE — Telephone Encounter (Signed)
Patient returned phone call regarding voicemail that was left, patient would prefer this to be a walk-in visit.

## 2019-01-30 NOTE — Telephone Encounter (Signed)
Called patient regarding upcoming virtual visit, left a voicemail. This will be a walk-in visit.

## 2019-01-31 ENCOUNTER — Encounter: Payer: Self-pay | Admitting: Genetic Counselor

## 2019-01-31 ENCOUNTER — Other Ambulatory Visit: Payer: Self-pay

## 2019-01-31 ENCOUNTER — Other Ambulatory Visit: Payer: Self-pay | Admitting: Genetic Counselor

## 2019-01-31 ENCOUNTER — Inpatient Hospital Stay: Payer: Medicare Other

## 2019-01-31 ENCOUNTER — Inpatient Hospital Stay: Payer: Medicare Other | Attending: Genetic Counselor | Admitting: Genetic Counselor

## 2019-01-31 DIAGNOSIS — Z8041 Family history of malignant neoplasm of ovary: Secondary | ICD-10-CM

## 2019-01-31 DIAGNOSIS — Z8052 Family history of malignant neoplasm of bladder: Secondary | ICD-10-CM | POA: Diagnosis not present

## 2019-01-31 DIAGNOSIS — Z170 Estrogen receptor positive status [ER+]: Secondary | ICD-10-CM

## 2019-01-31 DIAGNOSIS — Z800 Family history of malignant neoplasm of digestive organs: Secondary | ICD-10-CM

## 2019-01-31 DIAGNOSIS — Z803 Family history of malignant neoplasm of breast: Secondary | ICD-10-CM | POA: Diagnosis not present

## 2019-01-31 DIAGNOSIS — C50512 Malignant neoplasm of lower-outer quadrant of left female breast: Secondary | ICD-10-CM

## 2019-01-31 DIAGNOSIS — Z806 Family history of leukemia: Secondary | ICD-10-CM | POA: Diagnosis not present

## 2019-01-31 NOTE — Progress Notes (Signed)
REFERRING PROVIDER: Gardenia Phlegm, NP [place],  [place] [address]  PRIMARY PROVIDER:  Leeroy Cha, MD  PRIMARY REASON FOR VISIT:  1. Malignant neoplasm of lower-outer quadrant of left breast of female, estrogen receptor positive ([place])   2. Family history of ovarian cancer   3. Family history of bladder cancer   4. Family history of colon cancer   5. Family history of leukemia      HISTORY OF PRESENT ILLNESS:   Ms. Dannenberg, a 78 y.o. female, was seen for a [facility] cancer genetics consultation at the request of Wilber Bihari due to a personal and family history of cancer.  Ms. Urbanski presents to clinic today to discuss the possibility of a hereditary predisposition to cancer, genetic testing, and to further clarify her future cancer risks, as well as potential cancer risks for family members.   In 2011, at the age of 71, Ms. Bazin was diagnosed with invasive ductal carcinoma of the left breast. The treatment plan included lumpectomy, and radiation.  At that time she saw genetics, prior to her sister's diagnosis, and she did not qualify for testing.    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   07/23/2009 - 09/03/2009 Radiation Therapy   Adjuvant radiation therapy   10/13/2009 - 10/15/2013 Anti-estrogen oral therapy   Initially letrozole 2.5 mg daily 2 years then switched to tamoxifen then once again switched the Arimidex October 2013      RISK FACTORS:  Menarche was at age 31.  First live birth at age 33.  OCP use for approximately 5+ years.  Ovaries intact: yes.  Hysterectomy: no.  Menopausal status: postmenopausal.  HRT use: 20+ years. Colonoscopy: yes; normal. Mammogram within the last year: yes. Number of breast biopsies: 1. Up to date with pelvic  exams: n/a. Any excessive radiation exposure in the past: no  Past Medical History:  Diagnosis Date  . Asthma    daily inhaler  . Breast cancer ([place])    left- radiation and surgery -dx. 2011- no further tx. now- [name] , [name]  . Cyst of finger 11/2011   annular cyst right long finger  . Dental crowns present   . Dermatitis   . Family history of bladder cancer   . Family history of colon cancer   . Family history of leukemia   . Family history of ovarian cancer   . Frequency of urination   . GERD (gastroesophageal reflux disease)   . Hemorrhoid   . Hyperlipidemia   . Hypertension    under control, has been on med. x 2 yrs.  [name] PONV (postoperative nausea and vomiting)   . Seasonal allergies   . Tremors of nervous system    hands  . Trigger finger of right hand 11/2011   long finger    Past Surgical History:  Procedure Laterality Date  . ANTERIOR CERVICAL DECOMPRESSION/DISCECTOMY FUSION 4 LEVELS N/A 11/14/2013   Procedure: ANTERIOR CERVICAL DECOMPRESSION/DISCECTOMY FUSION 4 LEVELS;  Surgeon: Newman Pies, MD;  Location: [facility];  Service: Neurosurgery;  Laterality: N/A;  C34 C45 C56 C67 anterior cervical fusion with interbody prosthesis plating and  bonegraft  . APPENDECTOMY  age 55  . BREAST LUMPECTOMY  06/16/2009   left; SLN bx.  [name] BREAST LUMPECTOMY  07/01/2009   re-excision  .  BREAST SURGERY  1999   reduction  . CATARACT EXTRACTION, BILATERAL    . COLONOSCOPY WITH PROPOFOL N/A 12/03/2014   Procedure: COLONOSCOPY WITH PROPOFOL;  Surgeon: [name], MD;  Location: [facility];  Service: Endoscopy;  Laterality: N/A;  . FOOT SURGERY  06/22/11   left  . KNEE ARTHROSCOPY  03/12/2005   right  . KNEE ARTHROSCOPY     left  . NASAL SINUS SURGERY     x 2  . 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 TRIGGER FINGER RELEASE  12/16/2011   Procedure: RELEASE TRIGGER FINGER/A-1 PULLEY;  Surgeon: Tennis Must, MD;  Location:  [facility];  Service: Orthopedics;  Laterality: Right;  RIGHT LONG FINGER TRIGGER RELEASE & ANNULAR CYST EXCISION  . TUMOR EXCISION  age 20   right arm    Social History   Socioeconomic History  . Marital status: Married    Spouse name: Marcello Moores   . Number of children: 1  . Years of education: HS  . Highest education level: Not on file  Occupational History  . Occupation: Retired  Scientific laboratory technician  . Financial resource strain: Not on file  . Food insecurity    Worry: Not on file    Inability: Not on file  . Transportation needs    Medical: Not on file    Non-medical: Not on file  Tobacco Use  . Smoking status: Never Smoker  . Smokeless tobacco: Never Used  Substance and Sexual Activity  . Alcohol use: No    Alcohol/week: 0.0 standard drinks  . Drug use: No  . Sexual activity: Yes  Lifestyle  . Physical activity    Days per week: Not on file    Minutes per session: Not on file  . Stress: Not on file  Relationships  . Social Herbalist on phone: Not on file    Gets together: Not on file    Attends religious service: Not on file    Active member of club or organization: Not on file    Attends meetings of clubs or organizations: Not on file    Relationship status: Not on file  Other Topics Concern  . Not on file  Social History Narrative      Pt lives at home with her spouse.  [name]    Patient has 1 child.    Patient is retired.    Patient has a HS education.    Patient is right handed.    Patient drinks caffeine occasionally.                FAMILY HISTORY:  We obtained a detailed, 4-generation family history.  Significant diagnoses are listed below: Family History  Problem Relation Age of Onset  . Kidney disease Mother   . Stroke Father   . Stroke Sister        08/2015  . Diabetes Brother   . Heart disease Brother   . Ovarian cancer Sister 51  . Heart disease Paternal Grandmother   . Heart disease Paternal Grandfather   .  Leukemia Other 15       brother's grandson  . Bladder Cancer Brother 7  . Colon cancer Niece        dx in late 31s    The patient has one daughter who does not have cancer, but she had genetic testing that is reportedly negative.  The patient has three brothers and two  sisters.  One sister had ovarian cancer and one brother had bladder cancer.  This brother has a daughter who had colon cancer in her late 16's.  Another brother had a grandson who developed leukemia and died at 90.  Both parents are deceased.  The patient's mother died of kidney problems at 1.  She had four sisters and two brothers who are cancer free.  Her parents are deceased.  The patient's father died of a stroke.  He had 7-8 siblings who are cancer free.  His parents are deceased.  Ms. Wiehe is aware of previous family history of genetic testing for hereditary cancer risks. Patient's maternal ancestors are of Caucasian descent, and paternal ancestors are of Caucasian descent. There is no reported Ashkenazi Jewish ancestry. There is no known consanguinity.  GENETIC COUNSELING ASSESSMENT: Ms. Kubicki is a 78 y.o. female with a personal and family history of cancer which is somewhat suggestive of a hereditary cancer syndrome and predisposition to cancer given the combination of cancer. We, therefore, discussed and recommended the following at today's visit.   DISCUSSION: We discussed that 5 - 10% of breast cancer is hereditary, with most cases associated with BRCA mutations.  There are other genes that can be associated with hereditary breast cancer syndromes.  We discussed that testing is beneficial for several reasons including knowing how to follow individuals after completing their treatment, and understand if other family members could be at risk for cancer and allow them to undergo genetic testing.   We reviewed the characteristics, features and inheritance patterns of hereditary cancer syndromes. We also discussed  genetic testing, including the appropriate family members to test, the process of testing, insurance coverage and turn-around-time for results. We discussed the implications of a negative, positive, carrier and/or variant of uncertain significant result. We recommended Ms. Gerber pursue genetic testing for the common hereditary cancer gene panel. The Common Hereditary Gene Panel offered by [facility] includes sequencing and/or deletion duplication testing of the following 48 genes: APC, ATM, AXIN2, BARD1, BMPR1A, BRCA1, BRCA2, BRIP1, CDH1, CDK4, CDKN2A (p14ARF), CDKN2A (p16INK4a), CHEK2, CTNNA1, DICER1, EPCAM (Deletion/duplication testing only), GREM1 (promoter region deletion/duplication testing only), KIT, MEN1, MLH1, MSH2, MSH3, MSH6, MUTYH, NBN, NF1, NHTL1, PALB2, PDGFRA, PMS2, POLD1, POLE, PTEN, RAD50, RAD51C, RAD51D, RNF43, SDHB, SDHC, SDHD, SMAD4, SMARCA4. STK11, TP53, TSC1, TSC2, and VHL.  The following genes were evaluated for sequence changes only: SDHA and HOXB13 c.251G>A variant only.   Based on Ms. Comas's personal and family history of cancer, she meets medical criteria for genetic testing. Despite that she meets criteria, she may still have an out of pocket cost. We discussed that if her out of pocket cost for testing is over $100, the laboratory will call and confirm whether she wants to proceed with testing.  If the out of pocket cost of testing is less than $100 she will be billed by the genetic testing laboratory.   PLAN: After considering the risks, benefits, and limitations, Ms. Hefferan provided informed consent to pursue genetic testing and the blood sample was sent to [facility] for analysis of the common hereditary cancer panel. Results should be available within approximately 2-3 weeks' time, at which point they will be disclosed by telephone to Ms. Blanchette, as will any additional recommendations warranted by these results. Ms. Caputo will receive a summary of her genetic  counseling visit and a copy of her results once available. This information will also be available in Epic.   Lastly, we encouraged Ms. Vanderkolk to  remain in contact with cancer genetics annually so that we can continuously update the family history and inform her of any changes in cancer genetics and testing that may be of benefit for this family.   Ms. Bojarski questions were answered to her satisfaction today. Our contact information was provided should additional questions or concerns arise. Thank you for the referral and allowing [place] to share in the care of your patient.   Cas Tracz P. Florene Glen, [place], [facility] Licensed, Insurance risk surveyor [email or web address] phone: [phone]  The patient was seen for a total of 45 minutes in face-to-face genetic counseling.  This patient was discussed with Drs. Magrinat, Lindi Adie and/or Burr Medico who agrees with the above.    _______________________________________________________________________ For Office Staff:  Number of people involved in session: 1 Was an Intern/ student involved with case: no

## 2019-02-01 ENCOUNTER — Encounter: Payer: Self-pay | Admitting: Hematology and Oncology

## 2019-02-01 ENCOUNTER — Encounter: Payer: Medicare Other | Admitting: Occupational Therapy

## 2019-02-01 ENCOUNTER — Ambulatory Visit: Payer: Medicare Other | Admitting: Physical Therapy

## 2019-02-01 DIAGNOSIS — Z1231 Encounter for screening mammogram for malignant neoplasm of breast: Secondary | ICD-10-CM | POA: Diagnosis not present

## 2019-02-01 LAB — GENETIC SCREENING ORDER

## 2019-02-02 ENCOUNTER — Encounter: Payer: Medicare Other | Attending: Physical Medicine and Rehabilitation | Admitting: Physical Medicine & Rehabilitation

## 2019-02-02 ENCOUNTER — Encounter: Payer: Self-pay | Admitting: Physical Medicine & Rehabilitation

## 2019-02-02 ENCOUNTER — Other Ambulatory Visit: Payer: Self-pay

## 2019-02-02 VITALS — BP 136/92 | HR 86 | Temp 97.7°F | Ht 61.0 in | Wt 125.8 lb

## 2019-02-02 DIAGNOSIS — R2689 Other abnormalities of gait and mobility: Secondary | ICD-10-CM | POA: Insufficient documentation

## 2019-02-02 DIAGNOSIS — M7918 Myalgia, other site: Secondary | ICD-10-CM | POA: Diagnosis present

## 2019-02-02 DIAGNOSIS — M5416 Radiculopathy, lumbar region: Secondary | ICD-10-CM | POA: Diagnosis not present

## 2019-02-02 NOTE — Progress Notes (Signed)
Right L4-5 Lumbar transforaminal epidural steroid injection under fluoroscopic guidance with contrast enhancement  Indication: Lumbosacral radiculitis is not relieved by medication management or other conservative care and interfering with self-care and mobility.   Informed consent was obtained after describing risk and benefits of the procedure with the patient, this includes bleeding, bruising, infection, paralysis and medication side effects.  The patient wishes to proceed and has given written consent.  Patient was placed in prone position.  The lumbar area was marked and prepped with Betadine.  It was entered with a 25-gauge 1-1/2 inch needle and one mL of 1% lidocaine was injected into the skin and subcutaneous tissue.  Then a 22-gauge 3.5 in spinal needle was inserted into the Right L4-5  intervertebral foramen under AP, lateral, and oblique view.  Once needle tip was within the foramen on lateral views an dnor exceeding 6 o clock position on th epedical on AP viewed Isovue 200 was inected x 2ml Then a solution containing one mL of 10 mg per mL dexamethasone and 2 mL of 1% lidocaine was injected.  The patient tolerated procedure well.  Post procedure instructions were given.  Please see post procedure form.  

## 2019-02-02 NOTE — Progress Notes (Signed)
  PROCEDURE RECORD [facility]   Name: April Church DOB:[birth year] MRN: [id number]  Date:02/02/2019  Physician: Alysia Penna, MD    Nurse/CMA:   Allergies:  Allergies  Allergen Reactions  . Shellfish Allergy Shortness Of Breath  . Duloxetine Itching and Rash  . Sulfa Drugs Cross Reactors Anxiety and Rash  . Lyrica [Pregabalin] Other (See Comments)    "made me drunk, couldn't function"  . Codeine Anxiety    Consent Signed: Yes.    Is patient diabetic? No.  CBG today?   Pregnant: No. LMP: No LMP recorded. Patient is postmenopausal. (age 65-55)  Anticoagulants: no Anti-inflammatory: no Antibiotics: no  Procedure: right L4,5 transforaminal epidural steroid injection Position: Prone Start Time: 11:04am        End Time: 11:10am Fluoro Time: 20s  RN/CMA Truman Hayward, [employer], CMA    Time 10:30am 11:13am    BP 136/92 148/91    Pulse 86 87    Respirations 16 16    O2 Sat 98 98    S/S 6 6    Pain Level 3/10 0/10     D/C home with husband, patient A & O X 3, D/C instructions reviewed, and sits independently.

## 2019-02-02 NOTE — Patient Instructions (Signed)

## 2019-02-07 ENCOUNTER — Encounter: Payer: Self-pay | Admitting: Occupational Therapy

## 2019-02-07 ENCOUNTER — Other Ambulatory Visit: Payer: Self-pay

## 2019-02-07 ENCOUNTER — Ambulatory Visit: Payer: Medicare Other | Admitting: Occupational Therapy

## 2019-02-07 ENCOUNTER — Encounter: Payer: Self-pay | Admitting: Physical Therapy

## 2019-02-07 ENCOUNTER — Ambulatory Visit: Payer: Medicare Other | Attending: Internal Medicine | Admitting: Physical Therapy

## 2019-02-07 DIAGNOSIS — R278 Other lack of coordination: Secondary | ICD-10-CM | POA: Insufficient documentation

## 2019-02-07 DIAGNOSIS — R29818 Other symptoms and signs involving the nervous system: Secondary | ICD-10-CM

## 2019-02-07 DIAGNOSIS — R29898 Other symptoms and signs involving the musculoskeletal system: Secondary | ICD-10-CM

## 2019-02-07 DIAGNOSIS — R251 Tremor, unspecified: Secondary | ICD-10-CM | POA: Diagnosis present

## 2019-02-07 DIAGNOSIS — R2689 Other abnormalities of gait and mobility: Secondary | ICD-10-CM | POA: Diagnosis present

## 2019-02-07 DIAGNOSIS — M6281 Muscle weakness (generalized): Secondary | ICD-10-CM | POA: Insufficient documentation

## 2019-02-07 DIAGNOSIS — R293 Abnormal posture: Secondary | ICD-10-CM | POA: Insufficient documentation

## 2019-02-07 DIAGNOSIS — M5416 Radiculopathy, lumbar region: Secondary | ICD-10-CM | POA: Diagnosis not present

## 2019-02-07 DIAGNOSIS — R2681 Unsteadiness on feet: Secondary | ICD-10-CM

## 2019-02-07 NOTE — Therapy (Signed)
[facility] [address] [place], [place], [address] Phone: [phone]   Fax:  [phone]  Physical Therapy Treatment  Patient Details  Name: April Church MRN: [id number] Date of Birth: 1941/01/04 Referring Provider (PT): [place], [place]   Encounter Date: 02/07/2019  PT End of Session - 02/07/19 1409    Visit Number  3    Number of Visits  13    Date for PT Re-Evaluation  04/04/19    Authorization Type  Medicare/BCBS-Will need 10th visit progress note    PT Start Time  1405   running late with OT session prior to PT session   PT Stop Time  1445    PT Time Calculation (min)  40 min    Equipment Utilized During Treatment  Gait belt    Activity Tolerance  Patient tolerated treatment well;No increased pain    Behavior During Therapy  WFL for tasks assessed/performed       Past Medical History:  Diagnosis Date  . Asthma    daily inhaler  . Breast cancer ([facility])    left- radiation and surgery -dx. 2011- no further tx. now- [name] , [name]  . Cyst of finger 11/2011   annular cyst right long finger  . Dental crowns present   . Dermatitis   . Family history of bladder cancer   . Family history of colon cancer   . Family history of leukemia   . Family history of ovarian cancer   . Frequency of urination   . GERD (gastroesophageal reflux disease)   . Hemorrhoid   . Hyperlipidemia   . Hypertension    under control, has been on med. x 2 yrs.  [name] PONV (postoperative nausea and vomiting)   . Seasonal allergies   . Tremors of nervous system    hands  . Trigger finger of right hand 11/2011   long finger    Past Surgical History:  Procedure Laterality Date  . ANTERIOR CERVICAL DECOMPRESSION/DISCECTOMY FUSION 4 LEVELS N/A 11/14/2013   Procedure: ANTERIOR CERVICAL DECOMPRESSION/DISCECTOMY FUSION 4 LEVELS;  Surgeon: Newman Pies, MD;  Location: [facility];  Service: Neurosurgery;  Laterality: N/A;   C34 C45 C56 C67 anterior cervical fusion with interbody prosthesis plating and  bonegraft  . APPENDECTOMY  age 78  . BREAST LUMPECTOMY  06/16/2009   left; SLN bx.  [name] BREAST LUMPECTOMY  07/01/2009   re-excision  . BREAST SURGERY  1999   reduction  . CATARACT EXTRACTION, BILATERAL    . COLONOSCOPY WITH PROPOFOL N/A 12/03/2014   Procedure: COLONOSCOPY WITH PROPOFOL;  Surgeon: [name], MD;  Location: [facility];  Service: Endoscopy;  Laterality: N/A;  . FOOT SURGERY  06/22/11   left  . KNEE ARTHROSCOPY  03/12/2005   right  . KNEE ARTHROSCOPY     left  . NASAL SINUS SURGERY     x 2  . 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 TRIGGER FINGER RELEASE  12/16/2011   Procedure: RELEASE TRIGGER FINGER/A-1 PULLEY;  Surgeon: Tennis Must, MD;  Location: [facility];  Service: Orthopedics;  Laterality: Right;  RIGHT LONG FINGER TRIGGER RELEASE & ANNULAR CYST EXCISION  . TUMOR EXCISION  age 78   right arm    There were no vitals filed for this visit.  Subjective Assessment - 02/07/19 1402    Subjective  Missed some visits due to medical issues with spouse. No falls. Did  have her injection done on 02/02/19. Does report her back feeling much better now. Able to sleep at night now, where as her pain was keeping her up.    Pertinent History  Hx of Parkinson's disease; donated kidney to husband; hx of breast cancer    How long can you walk comfortably?  [place] and [place]; and that's the most she can do before leg pain limits    Diagnostic tests  MRI 09/2018, shows multi leve disc bulge lumbar spine    Patient Stated Goals  Pt's goal for therapy is to be more sure of balance and have less pain.    Currently in Pain?  Yes    Pain Score  2     Pain Location  Leg    Pain Orientation  Right    Pain Descriptors / Indicators  Aching    Pain Type  Chronic pain    Pain Radiating Towards  from back into right LE  all the way down    Pain Onset  More than a month ago    Pain Frequency  Intermittent    Aggravating Factors   immobility (staying in the bed)    Pain Relieving Factors  changing positions, getting up and moving around         [facility] PT Assessment - 02/07/19 1413      Flexibility   Soft Tissue Assessment /Muscle Length  yes    Hamstrings  Active hamstring ROM 65 degrees      Transfers   Transfers  Sit to Stand;Stand to Sit    Sit to Stand  6: Modified independent (Device/Increase time);Without upper extremity assist;From chair/3-in-1    Five time sit to stand comments   15.25 sec's no UE support using standard height chair    Stand to Sit  6: Modified independent (Device/Increase time);Without upper extremity assist;To chair/3-in-1      Ambulation/Gait   Ambulation/Gait  Yes    Ambulation/Gait Assistance  7: Independent    Ambulation/Gait Assistance Details  pt able to walk for 5 minutes at a steady pace with "slight" increase in pain. "I could keep going though". After a seated rest pt able to walk another 5 minutes without pain increase "I can feel it, it's not pain, pain though".     Assistive device  None    Gait Pattern  Step-through pattern;Decreased arm swing - right;Decreased step length - right;Narrow base of support    Ambulation Surface  Level;Indoor      Functional Gait  Assessment   Gait assessed   Yes          [facility] Adult PT Treatment/Exercise - 02/07/19 1413      Dynamic Gait Index   Level Surface  Normal    Change in Gait Speed  Mild Impairment   minimal change in gait speed   Gait with Horizontal Head Turns  Mild Impairment    Gait with Vertical Head Turns  Mild Impairment    Gait and Pivot Turn  Normal    Step Over Obstacle  Mild Impairment    Step Around Obstacles  Normal    Steps  Mild Impairment    Total Score  19      Self care- pt reports no issues with current HEP with review in session.     PT Short Term Goals - 02/07/19 1410      PT  SHORT TERM GOAL #1   Title  Pt will be independent with HEP for decreased pain and improved functional strength and gait.  TARGET 02/02/2019 (may be delayed due to scheduling around holidays)    Baseline  02/07/19: met with current HEP    Status  Achieved    Target Date  --      PT SHORT TERM GOAL #2   Title  Pt will improve 5x sit<>stand to less than or equal to 17 seconds for improved functional lower extremity strength.    Baseline  02/07/19: 15. 25 sec's no UE support using standard height chair    Time  --    Period  --    Status  Achieved    Target Date  --      PT SHORT TERM GOAL #3   Title  Pt will improve R hamstring ROM by at least 10 degrees for improved flexibility, decreased RLE pain.    Baseline  02/07/19: 65 degrees actively today    Time  --    Period  --    Status  Achieved    Target Date  02/02/19      PT SHORT TERM GOAL #4   Title  Pt will be able to ambulate > 5 minutes x 2 (simulating her neighborhood) without pain increase in low back and legs.    Baseline  02/07/19: met in session today    Time  --    Period  --    Status  Achieved    Target Date  02/02/19      PT SHORT TERM GOAL #5   Title  Pt will verbalize understanding of posture/positioning with ADLs and sleep to decrease back pain with funcitonal activities.    Baseline  02/07/19: has been using pillows for support with sleeping with it helping to reduce pain    Status  Achieved    Target Date  02/02/19        PT Long Term Goals - 01/04/19 1607      PT LONG TERM GOAL #1   Title  Pt will be independent with progression of HEP for decreased pain, improved mobility, strength, balance.  02/22/2019, 6 weeks for all LTGs    Time  6    Period  Weeks    Status  New      PT LONG TERM GOAL #2   Title  FGA to be assessed, with goal to be written as apporpriate.    Time  6    Period  Weeks    Status  New      PT LONG TERM GOAL #3   Title  Pt will improve 5x sit<>stand to less than or equal to 15  seconds for improved functional lower extremity strength.    Time  6    Period  Weeks    Status  New      PT LONG TERM GOAL #4   Title  Pt will improve gait velocity to at least 3 ft/sec for improved gait efficiency and safety in community.    Time  6    Period  Weeks    Status  New      PT LONG TERM GOAL #5   Title  Pt will report at least 10 percentage point decrease in Oswestry Back pain score, to demonstrate improved low back pain with funcitonal mobility and daily tasks.    Baseline  Eval:  Oswestry 32%    Period  Weeks    Status  New  Plan - 02/07/19 1410    Clinical Impression Statement  Pt met all STGs today. She scored 19/24 on Dynamic Gait Index as her baseline. The pt does still endorce minor pain in back/LE and should benefit from continued PT to address strengthening and balance.    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 transplant    Examination-Activity Limitations  Bed Mobility;Transfers;Sit;Stand;Locomotion Level    Examination-Participation Restrictions  Meal Prep;Community Activity   Walking in neighborhood   Stability/Clinical Decision Making  Evolving/Moderate complexity    Rehab Potential  Good    PT Frequency  2x / week    PT Duration  6 weeks   plus eval   PT Treatment/Interventions  ADLs/Self Care Home Management;Electrical Stimulation;Ultrasound;Traction;Moist Heat;Gait training;Functional mobility training;Therapeutic activities;Balance training;Therapeutic exercise;Neuromuscular re-education;Patient/family education    PT Next Visit Plan  continue to work on extension based activities for decreased pain, try prone. continue to work on stretching and core strengthening as well.    Consulted and Agree with Plan of Care  Patient       Patient will benefit from skilled therapeutic intervention in order to improve the following deficits and  impairments:  Abnormal gait, Decreased mobility, Decreased balance, Difficulty walking, Decreased strength, Postural dysfunction, Impaired flexibility  Visit Diagnosis: Radiculopathy, lumbar region  Muscle weakness (generalized)  Other abnormalities of gait and mobility  Unsteadiness on feet     Problem List Patient Active Problem List   Diagnosis Date Noted  . Family history of ovarian cancer   . Family history of bladder cancer   . Family history of colon cancer   . Family history of leukemia   . Lumbar radiculopathy 12/08/2018  . Myofascial pain 12/08/2018  . Impaired gait and mobility 12/08/2018  . 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 Chronic nonallergic rhinitis 12/29/2016  . Mild persistent asthma, uncomplicated 29/51/8841  . 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 Gastroesophageal reflux disease 12/29/2016  . Cervical spondylosis with myelopathy and radiculopathy 11/14/2013  . Essential tremor 06/13/2013  . Cervical spondylosis without myelopathy 06/13/2013  . Breast cancer of lower-outer quadrant of left female breast ([facility]) 09/10/2010    Willow Ora, PTA, [facility] [address], [place], [place] [address] [phone] 02/07/19, 3:14 PM   Name: ALAYZIAH TANGEMAN MRN: [id number] Date of Birth: [birth year]

## 2019-02-07 NOTE — Patient Instructions (Signed)
Sinemet Info  The most potent medication for Parkinson's disease (PD) is levodopa. Its development in the late 1960s represents one of the most important breakthroughs in the history of medicine. Plain levodopa produces nausea and vomiting. It is combined with carbidopa to prevent this side effect. The well-known combined carbidopa/levodopa name brand formulation is called Sinemet. There are many different preparations and strengths of carbidopa/levodopa, including long-acting forms, a combined long and short-acting capsule called Rytary, a formulation that dissolves in the mouth without water, called Parcopa, and a combined formulation that includes the COMT inhibitor entacapone, called Stalevo. It is important that people with PD are aware which levodopa preparation they are taking because there are so many different pill sizes, strengths and manufacturers. Be careful when renewing prescriptions at the pharmacy because the accidental substitute of a different formulation may lead to an overdose or underdose. Carbidopa/levodopa remains the most effective drug to treat PD. The addition of carbidopa prevents levodopa from being converted into dopamine prematurely in the bloodstream, allowing more of it to get to the brain. Therefore, a smaller dose of levodopa is needed to treat symptoms. Some people with PD have been reluctant to take it, believing it to be a last resort. But most neurologists agree that delaying treatment too long is unwise and may put a person with PD at risk for falling and decreased optimal, consistent symptom benefit. The decision about when to start carbidopa/levodopa is different for every person with PD and requires consideration of potential benefits, risks and the availability of alternatives. Unfortunately, with time, patients experience other side effects including dyskinesias (spontaneous, involuntary movements) and "on-off" periods when the medication will suddenly and  unpredictably start or stop working. It is unclear whether this is a symptom of starting the medication at an advanced stage of PD or whether it is related to prolonged use of levodopa (although there is some published evidence for the former explanation). Check with a doctor before taking any of the following to avoid possible interactions: antacids, anti-seizure drugs, anti-hypertensives, anti-depressants and high protein food. The same drugs that interact with carbidopa/levodopa and entacapone interact with Stalevo.  What are the facts? The drug levodopa is synthesized in the brain into dopamine. It is the most important first-line drug for the management of Parkinson's.  Levodopa is almost always given in combination with the drug carbidopa, which prevents the nausea that can be caused by levodopa alone. Carbidopa is also a levodopa enhancer. When added, carbidopa enables a much lower dose of levodopa (80 percent less) and helps reduce the side effects of nausea and vomiting. Pills containing both drugs are often labeled "carbidopa-levodopa," with the active components listed in alphabetical order.  Levodopa in pill form is absorbed in the blood from the small intestine and travels through the blood to the brain, where it is converted into dopamine, needed by the body for movement.  Carbidopa-levodopa tablets are available in immediate-release and slow-release forms as well as dissolvable tablets that are placed under the tongue.  Carbidopa-levodopa immediate/extended release combination capsules (RytaryT) maintain levodopa concentrations longer than the immediate-release or other available oral levodopa formulations. Following an initial peak at about one hour, plasma levodopa concentrations are maintained for about four to five hours before declining. Clinical trials indicate that patients with motor fluctuations on other oral carbidopa-levodopa products may be able to switch to RytaryT and experience  a reduction in "off" time while requiring fewer medication administrations. Carbidopa/levodopa can be taken with or without food, but high fat   meals may delay absorption. Dosages are not interchangeable with dosages other carbidopa-levodopa products. For more information relating to prescribing RytaryT, please see How to Dose Carbidopa and Levodopa Extended-Release Capsules (Rytary), by Dr. Robert A. Hauser of the [employer], a Center of Excellence.  Carbidopa/levodopa is also now available via a dopamine intestinal infusion pump (DUOPAT), which provides 16 continuous hours of carbidopa and levodopa for motor symptoms. The small, portable infusion pump delivers carbidopa and levodopa directly into the small intestine. In a clinical trial, the amount of "on" time without troublesome dyskinesia was better in the pump group when compared to the placebo group (4.1 vs. 2.2 hours). One of the major drawbacks to the pump approach is the need for a percutaneous gastrojejunostomy (a small feeding tube). For more information relating to prescribing DuopaT, please see Carbidopa/Levodopa Enteral Suspension (Duopa) by Rajesh Pahwa, MD, and Kelly Lyons, PhD, of the [employer], a Center of Excellence.  Common Side Effects Nausea  Vomiting  Loss of appetite  Lightheadedness  Lowered blood pressure  Confusion  Dyskinesia (if used as a long-term therapy; between three to five years)  People who use levodopa long term may experience dyskinesia at some point, usually three to five years after starting the medication.  The term dyskinesia describes involuntary, erratic, writhing movements of the face, arms, legs, and/or trunk, which usually occur one to two hours after a dose of levodopa has been absorbed into the bloodstream and is having its peak clinical effect.  Uncommon Side Effects Sleepiness, sudden onset sleep  Eating Proteins with Levodopa/Sinemet* With more advanced PD, it is best  to take Sinemet 30 to 60 minutes before eating a meal. This allows for quick absorption before food can interfere.  Take the Sinemet along with non-protein foods.  Ginger tea is a good choice for many people, because it often "settles the stomach".  A graham or soda cracker along with ginger tea may help and are low in protein so should not interfere with the absorption of Sinemet.  If you cannot tolerate Sinemet because of nausea, upset stomach, you may need to actually take it with food. Caution: PD medications may have interactions with certain foods, other medications, vitamins, herbal supplements, over the counter cold pills and other remedies. Anyone taking a PD medication should talk to their doctor and pharmacist about potential drug interactions.    

## 2019-02-07 NOTE — Therapy (Signed)
[facility] [address] [place], [place], [address] Phone: [phone]   Fax:  [phone]  Occupational Therapy Evaluation  Patient Details  Name: April Church MRN: [id number] Date of Birth: 22-Apr-1940 Referring Provider (OT): Dr. Andrey Spearman   Encounter Date: 02/07/2019  OT End of Session - 02/07/19 1425    Visit Number  1    Number of Visits  13    Date for OT Re-Evaluation  03/24/19    Authorization Type  Medicare & BCBS Supplement, covered 100%    Authorization Time Period  02/07/19-05/06/19    Authorization - Visit Number  1    Authorization - Number of Visits  10    OT Start Time  [id number]    OT Stop Time  1402    OT Time Calculation (min)  41 min    Activity Tolerance  Patient tolerated treatment well    Behavior During Therapy  [facility] for tasks assessed/performed       Past Medical History:  Diagnosis Date  . Asthma    daily inhaler  . Breast cancer ([place])    left- radiation and surgery -dx. 2011- no further tx. now- [name] , [name]  . Cyst of finger 11/2011   annular cyst right long finger  . Dental crowns present   . Dermatitis   . Family history of bladder cancer   . Family history of colon cancer   . Family history of leukemia   . Family history of ovarian cancer   . Frequency of urination   . GERD (gastroesophageal reflux disease)   . Hemorrhoid   . Hyperlipidemia   . Hypertension    under control, has been on med. x 2 yrs.  [name] PONV (postoperative nausea and vomiting)   . Seasonal allergies   . Tremors of nervous system    hands  . Trigger finger of right hand 11/2011   long finger    Past Surgical History:  Procedure Laterality Date  . ANTERIOR CERVICAL DECOMPRESSION/DISCECTOMY FUSION 4 LEVELS N/A 11/14/2013   Procedure: ANTERIOR CERVICAL DECOMPRESSION/DISCECTOMY FUSION 4 LEVELS;  Surgeon: Newman Pies, MD;  Location: [facility];  Service: Neurosurgery;  Laterality:  N/A;  C34 C45 C56 C67 anterior cervical fusion with interbody prosthesis plating and  bonegraft  . APPENDECTOMY  age 65  . BREAST LUMPECTOMY  06/16/2009   left; SLN bx.  [name] BREAST LUMPECTOMY  07/01/2009   re-excision  . BREAST SURGERY  1999   reduction  . CATARACT EXTRACTION, BILATERAL    . COLONOSCOPY WITH PROPOFOL N/A 12/03/2014   Procedure: COLONOSCOPY WITH PROPOFOL;  Surgeon: [name], MD;  Location: [facility];  Service: Endoscopy;  Laterality: N/A;  . FOOT SURGERY  06/22/11   left  . KNEE ARTHROSCOPY  03/12/2005   right  . KNEE ARTHROSCOPY     left  . NASAL SINUS SURGERY     x 2  . 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 TRIGGER FINGER RELEASE  12/16/2011   Procedure: RELEASE TRIGGER FINGER/A-1 PULLEY;  Surgeon: Tennis Must, MD;  Location: [facility];  Service: Orthopedics;  Laterality: Right;  RIGHT LONG FINGER TRIGGER RELEASE & ANNULAR CYST EXCISION  . TUMOR EXCISION  age 80   right arm    There were no vitals filed for this visit.  Subjective Assessment - 02/07/19 1324    Subjective   pt reports that back pain  was better after injection, but has come back some.  Pt reports that she decided not to get DBS.  Pt eports writing at times is difficult and today is a good day.    Pertinent History  Parkinson's disease.   PMH:  hx of kidney donor, hx of L breast CA, ET, cervical spondylosis and cervical decompression/fusion surgery, hx of R 3rd digit trigger finger release and cyst, HTN, hyperlipidemia, hx of RUE tumor excision, lumbar radiculopathy (currently being treated for this by Dr. Letta Pate and PT)    Limitations  currently being treated for lumbar radiculopathy    Patient Stated Goals  improve coordination, writing, R hand function    Currently in Pain?  Yes    Pain Score  2     Pain Location  Leg    Pain Orientation  Right;Left;Lower    Pain Descriptors / Indicators  Aching    Pain Type  Chronic pain    Pain  Onset  More than a month ago    Pain Frequency  Intermittent    Aggravating Factors   in mornings, moving, prolonged sitting    Pain Relieving Factors  changeing positions, tylenol helps some        [facility] OT Assessment - 02/07/19 0001      Assessment   Medical Diagnosis  Parkinson's Disease    Referring Provider (OT)  Dr. Andrey Spearman    Onset Date/Surgical Date  01/04/19   OT screen   Hand Dominance  Right    Prior Therapy  last OT d/c 2/20      Precautions   Precautions  Fall      Balance Screen   Has the patient fallen in the past 6 months  No      Home  Environment   Family/patient expects to be discharged to:  Private residence    Lives With  [employer]  Retired    Leisure  Used to enjoy walking in neighborhood, but pain has limited.      ADL   Eating/Feeding  --   using BUEs for sandwich or glass, has build-up grips   Grooming  Modified independent   electric tooth brush   Upper Body Bathing  Modified independent    Lower Body Bathing  Modified independent    Upper Body Dressing  --   difficulty with fasteners   Lower Body Dressing  Modified independent    Toilet Transfer  Modified independent    Toileting - Clothing Manipulation  Modified independent    Toileting -  Hygiene  Modified Independent    Tub/Shower Transfer  Modified independent    ADL comments  pt reports difficulty getting up from car seat or recliner, dropping items from R hand, has to carry items with BUEs.  Difficulty opening containers/packages      IADL   Shopping  Takes care of all shopping needs independently    Prior Level of Function Light Housekeeping  cleaning person for heavier task, but fatigues quickly for light daily tasks    Prior Level of Function Meal Prep  --    Meal Prep  Plans, prepares and serves adequate meals independently    [place] own vehicle   incr difficulty turning to look  prior to pulling out   Prior Level of Function Financial Management  takes longer on computer    Financial Management  Manages financial matters independently (budgets, writes checks, pays rent, bills goes to bank), collects and keeps track of income      Written Expression   Dominant Hand  Right    Handwriting  100% legible;Increased time   pt reports decr when writing more, good today for sentence      Activity Tolerance   Activity Tolerance Comments  pt reports that she fatigues quickly when cleaning      Observation/Other Assessments   Standing Functional Reach Test  NT    Other Surveys   Select    Physical Performance Test    Yes    Simulated Eating Time (seconds)  15.97    Simulated Eating Comments  holds spoon with gross grasp on end    Donning Doffing Jacket Time (seconds)  13.75    Donning Doffing Jacket Comments  fastening/unfastening 3 buttons in 42.84sec      Posture/Postural Control   Posture/Postural Control  Postural limitations    Postural Limitations  Rounded Shoulders;Forward head;Increased thoracic kyphosis      Sensation   Additional Comments  Tingling in R hand intermittently      Coordination   9 Hole Peg Test  Right;Left    Right 9 Hole Peg Test  40.72    Left 9 Hole Peg Test  27.19    Box and Blocks  R-30 blocks, L-36 blocks    Tremors  mod action tremors with R hand      ROM / Strength   AROM / PROM / Strength  AROM      AROM   Overall AROM Comments  improved R thumb/2nd digit opposition today, but continues to use lateral pinch      Hand Function   Right Hand Grip (lbs)  32.4    Right Hand Lateral Pinch  8 lbs    Right Hand 3 Point Pinch  --   unable to perform with resistance, but able to oppose/touch    Left Hand Grip (lbs)  36.4                      OT Education - 02/07/19 1424    Education Details  timing of PD medications and interaction with protein (recommended spacing medication evenly during waking hours and take at  least 72min-1 hour prior to meals, and take meds immediately upon waking in the morning).  Recommended using typed labels inside Christmas cards as well.    Person(s) Educated  Patient    Methods  Explanation    Comprehension  Verbalized understanding       OT Short Term Goals - 02/07/19 1440      OT SHORT TERM GOAL #1   Title  Pt will be independent with updated HEP--check STGs  03/09/19.    Time  4    Period  Weeks    Status  New      OT SHORT TERM GOAL #2   Title  Pt will be able to write short paragraph/check with 100% legibility using strategies/AE prn.    Time  4    Period  Weeks    Status  New      OT SHORT TERM GOAL #3   Title  Assess shoulder/elbow ROM and tone and set goal as appropriate.    Time  4    Period  Weeks    Status  New      OT SHORT TERM GOAL #4   Title  Pt will  be able to fasten/unfasten 3 buttons in less than 38sec.    Baseline  42.84    Time  4    Period  Weeks    Status  New      OT SHORT TERM GOAL #5   Title  ----        OT Long Term Goals - 02/07/19 1446      OT LONG TERM GOAL #1   Title  Pt will verbalize understanding of strategies/AE to incr ease with ADLs.--check LTGs 03/25/19    Time  6    Period  Weeks    Status  New      OT LONG TERM GOAL #2   Title  Pt will improve coordination for ADLs as shown by improving time on 9-hole peg test by at least 3sec with R hand.    Baseline  40.72    Time  6    Period  Weeks    Status  New      OT LONG TERM GOAL #3   Title  Pt will improve coordination/functional reaching for ADLs as shown by improving score on box and blocks with RUE by at least 4 blocks    Baseline  30 blocks    Time  6    Period  Weeks    Status  New      OT LONG TERM GOAL #4   Title  Pt will be able to pick up small objects using tip pinch at least 75% of the time with R hand.    Time  6    Period  Weeks    Status  New      OT LONG TERM GOAL #5   Title  ----            Plan - 02/07/19 1427    Clinical  Impression Statement  Pt is a 78y.o. female diagnosed with Parkinson's disease.  Pt is familiar to this therapist from previous OT (last discharged 04/13/18).  OT was recommended after PD screen 01/04/19 with noted decr coordination and decline in handwriting.  Pt with PMH that includes:  hx of Kidney donor, hx of L breast CA, ET, cervical spondylosis and cervical decompression/fusion surgery, hx of R 3rd digit trigger finger release  and cyst, HTN, hyperlipidemia, hx of RUE tumor excision.  Pt is also currently being treated for lumbar radiculopathy.  Pt presents today with bradykinesia, rigidity, tremor, decr coordination, decr posture, decr balance/functional mobility for ADLs, R hand weakness.  Pt would benefit from occupational therapy to address these deficits for improved dominant R hand functional use, improved ease/efficiency with ADLs/IADLs, and to reduce risk for future complications.    OT Occupational Profile and History  Detailed Assessment- Review of Records and additional review of physical, cognitive, psychosocial history related to current functional performance    Occupational performance deficits (Please refer to evaluation for details):  ADL's;IADL's;Leisure    Body Structure / Function / Physical Skills  ADL;Dexterity;ROM;IADL;Balance;Mobility;Strength;Tone;FMC;Coordination;Pain;UE functional use;GMC;Improper spinal/pelvic alignment;Endurance    Rehab Potential  Good    Clinical Decision Making  Several treatment options, min-mod task modification necessary    Comorbidities Affecting Occupational Performance:  May have comorbidities impacting occupational performance    Modification or Assistance to Complete Evaluation   Min-Moderate modification of tasks or assist with assess necessary to complete eval    OT Frequency  2x / week    OT Duration  6 weeks   +eval   OT Treatment/Interventions  Self-care/ADL  training;Moist Heat;Fluidtherapy;DME and/or AE instruction;Splinting;Balance  training;Therapeutic activities;Aquatic Therapy;Ultrasound;Therapeutic exercise;Cognitive remediation/compensation;Passive range of motion;Functional Mobility Training;Neuromuscular education;Energy conservation;Manual Therapy;Patient/family education;Paraffin    Plan  update HEP; Assess shoulder/elbow ROM and tone (not formally assessed today due to time restraints) and set establish goal as appropriate; check on medication schedule    Consulted and Agree with Plan of Care  Patient       Patient will benefit from skilled therapeutic intervention in order to improve the following deficits and impairments:   Body Structure / Function / Physical Skills: ADL, Dexterity, ROM, IADL, Balance, Mobility, Strength, Tone, FMC, Coordination, Pain, UE functional use, GMC, Improper spinal/pelvic alignment, Endurance       Visit Diagnosis: Other symptoms and signs involving the nervous system - Plan: Ot plan of care cert/re-cert  Other symptoms and signs involving the musculoskeletal system - Plan: Ot plan of care cert/re-cert  Tremor - Plan: Ot plan of care cert/re-cert  Other abnormalities of gait and mobility - Plan: Ot plan of care cert/re-cert  Unsteadiness on feet - Plan: Ot plan of care cert/re-cert  Other lack of coordination - Plan: Ot plan of care cert/re-cert  Abnormal posture - Plan: Ot plan of care cert/re-cert  Muscle weakness (generalized) - Plan: Ot plan of care cert/re-cert    Problem List Patient Active Problem List   Diagnosis Date Noted  . Family history of ovarian cancer   . Family history of bladder cancer   . Family history of colon cancer   . Family history of leukemia   . Lumbar radiculopathy 12/08/2018  . Myofascial pain 12/08/2018  . Impaired gait and mobility 12/08/2018  . 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 Chronic nonallergic rhinitis 12/29/2016  . Mild persistent asthma, uncomplicated [id number]  . 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  Gastroesophageal reflux disease 12/29/2016  . Cervical spondylosis with myelopathy and radiculopathy 11/14/2013  . Essential tremor 06/13/2013  . Cervical spondylosis without myelopathy 06/13/2013  . Breast cancer of lower-outer quadrant of left female breast [facility]) 09/10/2010    [facility] 02/07/2019, 2:53 PM  [facility] [address] [place], [place], [address] Phone: [phone]   Fax:  [phone]  Name: April Church MRN: [id number] Date of Birth: [birth year]   Vianne Bulls, OTR/L [facility] [address]. [place], [place]  [address] [phone] phone [phone] 02/07/19 2:53 PM

## 2019-02-12 ENCOUNTER — Ambulatory Visit: Payer: Medicare Other | Admitting: Occupational Therapy

## 2019-02-12 ENCOUNTER — Ambulatory Visit: Payer: Medicare Other | Admitting: Physical Therapy

## 2019-02-13 ENCOUNTER — Encounter: Payer: Self-pay | Admitting: Genetic Counselor

## 2019-02-13 ENCOUNTER — Telehealth: Payer: Self-pay | Admitting: Genetic Counselor

## 2019-02-13 ENCOUNTER — Ambulatory Visit: Payer: Self-pay | Admitting: Genetic Counselor

## 2019-02-13 DIAGNOSIS — Z1379 Encounter for other screening for genetic and chromosomal anomalies: Secondary | ICD-10-CM

## 2019-02-13 NOTE — Telephone Encounter (Signed)
Revealed negative genetic testing.  Discussed that we do not know why she has breast cancer or why there is cancer in the family. It could be due to a different gene that we are not testing, or maybe our current technology may not be able to pick something up.  It will be important for her to keep in contact with genetics to keep up with whether additional testing may be needed.  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  This will not change medical management.

## 2019-02-13 NOTE — Progress Notes (Addendum)
HPI:  April Church was previously seen in the [facility] clinic due to a personal and family history of cancer and concerns regarding a hereditary predisposition to cancer. Please refer to our prior cancer genetics clinic note for more information regarding our discussion, assessment and recommendations, at the time. April Church recent genetic test results were disclosed to her, as were recommendations warranted by these results. These results and recommendations are discussed in more detail below.  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   07/23/2009 - 09/03/2009 Radiation Therapy   Adjuvant radiation therapy   10/13/2009 - 10/15/2013 Anti-estrogen oral therapy   Initially letrozole 2.5 mg daily 2 years then switched to tamoxifen then once again switched the Arimidex October 2013   02/09/2019 Genetic Testing   ATM and RNF43 VUS identified on the common hereditary cancer panel.  The Common Hereditary Gene Panel offered by [facility] includes sequencing and/or deletion duplication testing of the following 48 genes: APC, ATM, AXIN2, BARD1, BMPR1A, BRCA1, BRCA2, BRIP1, CDH1, CDK4, CDKN2A (p14ARF), CDKN2A (p16INK4a), CHEK2, CTNNA1, DICER1, EPCAM (Deletion/duplication testing only), GREM1 (promoter region deletion/duplication testing only), KIT, MEN1, MLH1, MSH2, MSH3, MSH6, MUTYH, NBN, NF1, NHTL1, PALB2, PDGFRA, PMS2, POLD1, POLE, PTEN, RAD50, RAD51C, RAD51D, RNF43, SDHB, SDHC, SDHD, SMAD4, SMARCA4. STK11, TP53, TSC1, TSC2, and VHL.  The following genes were evaluated for sequence changes only: SDHA and HOXB13 c.251G>A variant only.  The report date is 02/09/2019.     FAMILY HISTORY:  We obtained a detailed, 4-generation family history.  Significant diagnoses are listed below: Family History  Problem Relation  Age of Onset   Kidney disease Mother    Stroke Father    Stroke Sister        08/2015   Diabetes Brother    Heart disease Brother    Ovarian cancer Sister 18   Heart disease Paternal Grandmother    Heart disease Paternal Grandfather    Leukemia Other 56       brother's grandson   Bladder Cancer Brother 54   Colon cancer Niece        dx in late 29s    The patient has one daughter who does not have cancer, but she had genetic testing that is reportedly negative.  The patient has three brothers and two sisters.  One sister had ovarian cancer and one brother had bladder cancer.  This brother has a daughter who had colon cancer in her late 55's.  Another brother had a grandson who developed leukemia and died at 18.  Both parents are deceased.   The patient's mother died of kidney problems at 59.  She had four sisters and two brothers who are cancer free.  Her parents are deceased.   The patient's father died of a stroke.  He had 7-8 siblings who are cancer free.  His parents are deceased.   April Church is aware of previous family history of genetic testing for hereditary cancer risks. Patient's maternal ancestors are of Caucasian descent, and paternal ancestors are of Caucasian descent. There is no reported Ashkenazi Jewish ancestry. There is no known consanguinity.  GENETIC TEST RESULTS: Genetic testing reported out on February 09, 2019 through the common hereditary cancer panel found no pathogenic mutations. The Common Hereditary Gene Panel offered by [facility] includes sequencing and/or deletion duplication testing of the following  48 genes: APC, ATM, AXIN2, BARD1, BMPR1A, BRCA1, BRCA2, BRIP1, CDH1, CDK4, CDKN2A (p14ARF), CDKN2A (p16INK4a), CHEK2, CTNNA1, DICER1, EPCAM (Deletion/duplication testing only), GREM1 (promoter region deletion/duplication testing only), KIT, MEN1, MLH1, MSH2, MSH3, MSH6, MUTYH, NBN, NF1, NHTL1, PALB2, PDGFRA, PMS2, POLD1, POLE, PTEN, RAD50, RAD51C, RAD51D, RNF43, SDHB,  SDHC, SDHD, SMAD4, SMARCA4. STK11, TP53, TSC1, TSC2, and VHL.  The following genes were evaluated for sequence changes only: SDHA and HOXB13 c.251G>A variant only. The test report has been scanned into EPIC and is located under the Molecular Pathology section of the Results Review tab.  A portion of the result report is included below for reference.     We discussed with April Church that because current genetic testing is not perfect, it is possible there may be a gene mutation in one of these genes that current testing cannot detect, but that chance is small.  We also discussed, that there could be another gene that has not yet been discovered, or that we have not yet tested, that is responsible for the cancer diagnoses in the family. It is also possible there is a hereditary cause for the cancer in the family that April Church did not inherit and therefore was not identified in her testing.  Therefore, it is important to remain in touch with cancer genetics in the future so that we can continue to offer April Church the most up to date genetic testing.   Genetic testing did identify two Variants of uncertain significance (VUS) - one in the ATM gene called c.2466A>G and a second in the RNF43 gene called c.2248G>C.  At this time, it is unknown if these variants are associated with increased cancer risk or if they are normal findings, but most variants such as these get reclassified to being inconsequential. They should not be used to make medical management decisions. With time, we suspect the lab will determine the significance of these variants, if any. If we do learn more about them, we will try to contact April Church to discuss it further. However, it is important to stay in touch with [place] periodically and keep the address and phone number up to date.  UPDATE: ATM VUS has been reclassified to Benign.  The amended report is September 15, 2020.  ADDITIONAL GENETIC TESTING: We discussed with April Church that  there are other genes that are associated with increased cancer risk that can be analyzed. Should April Church wish to pursue additional genetic testing, we are happy to discuss and coordinate this testing, at any time.    CANCER SCREENING RECOMMENDATIONS: April Church test result is considered negative (normal).  This means that we have not identified a hereditary cause for her personal and family history of cancer at this time. Most cancers happen by chance and this negative test suggests that her cancer may fall into this category.    While reassuring, this does not definitively rule out a hereditary predisposition to cancer. It is still possible that there could be genetic mutations that are undetectable by current technology. There could be genetic mutations in genes that have not been tested or identified to increase cancer risk.  Therefore, it is recommended she continue to follow the cancer management and screening guidelines provided by her oncology and primary healthcare provider.   An individual's cancer risk and medical management are not determined by genetic test results alone. Overall cancer risk assessment incorporates additional factors, including personal medical history, family history, and any available genetic information that may result in  a personalized plan for cancer prevention and surveillance  RECOMMENDATIONS FOR FAMILY MEMBERS:  Individuals in this family might be at some increased risk of developing cancer, over the general population risk, simply due to the family history of cancer.  We recommended women in this family have a yearly mammogram beginning at age 36, or 64 years younger than the earliest onset of cancer, an annual clinical breast exam, and perform monthly breast self-exams. Women in this family should also have a gynecological exam as recommended by their primary provider. All family members should have a colonoscopy by age 74.  FOLLOW-UP: Lastly, we discussed  with April Church that cancer genetics is a rapidly advancing field and it is possible that new genetic tests will be appropriate for her and/or her family members in the future. We encouraged her to remain in contact with cancer genetics on an annual basis so we can update her personal and family histories and let her know of advances in cancer genetics that may benefit this family.   Our contact number was provided. April Church questions were answered to her satisfaction, and she knows she is welcome to call us at anytime with additional questions or concerns.   Roma Kayser, [place], [facility] Licensed, Certified Genetic Counselor [email or web address]

## 2019-02-20 ENCOUNTER — Other Ambulatory Visit: Payer: Self-pay

## 2019-02-20 ENCOUNTER — Encounter: Payer: Self-pay | Admitting: Physical Therapy

## 2019-02-20 ENCOUNTER — Ambulatory Visit: Payer: Medicare Other | Admitting: Occupational Therapy

## 2019-02-20 ENCOUNTER — Ambulatory Visit: Payer: Medicare Other | Admitting: Physical Therapy

## 2019-02-20 DIAGNOSIS — R278 Other lack of coordination: Secondary | ICD-10-CM

## 2019-02-20 DIAGNOSIS — M6281 Muscle weakness (generalized): Secondary | ICD-10-CM

## 2019-02-20 DIAGNOSIS — R2689 Other abnormalities of gait and mobility: Secondary | ICD-10-CM

## 2019-02-20 DIAGNOSIS — R29898 Other symptoms and signs involving the musculoskeletal system: Secondary | ICD-10-CM

## 2019-02-20 DIAGNOSIS — R29818 Other symptoms and signs involving the nervous system: Secondary | ICD-10-CM

## 2019-02-20 DIAGNOSIS — R293 Abnormal posture: Secondary | ICD-10-CM

## 2019-02-20 DIAGNOSIS — R251 Tremor, unspecified: Secondary | ICD-10-CM

## 2019-02-20 DIAGNOSIS — R2681 Unsteadiness on feet: Secondary | ICD-10-CM

## 2019-02-20 DIAGNOSIS — M5416 Radiculopathy, lumbar region: Secondary | ICD-10-CM | POA: Diagnosis not present

## 2019-02-20 NOTE — Patient Instructions (Addendum)
Access Code: [email or web address]  URL: [email or web address]  Date: 02/20/2019  Prepared by: Mady Haagensen   Exercises Seated Hamstring Stretch - 3 reps - 1 sets - 30 sec hold - 1-2x daily - 7x weekly Seated Pelvic Tilt - 10 reps - 1-2 sets - 1-2x daily - 5x weekly Standing Lumbar Extension - 5-10 reps - 1 sets - 1-2x daily - 5x weekly  Updated HEP 02/20/2019:  Sit to Stand - 10 reps - 1-2 sets - 1-2x daily - 5x weekly Standing Gastroc Stretch on Step - 3 reps - 1 sets - 30 sec hold - 1-2x daily - 7x weekly Staggered Stance Forward Backward Weight Shift with Counter Support - 10 reps - 1-2 sets - 1x daily - 7x weekly

## 2019-02-20 NOTE — Therapy (Signed)
[facility] [address] [place], [place], [address] Phone: [phone]   Fax:  [phone]  Physical Therapy Treatment  Patient Details  Name: April Church MRN: [id number] Date of Birth: August 18, 1940 Referring Provider (PT): Dagoberto Ligas, [place]   Encounter Date: 02/20/2019  PT End of Session - 02/20/19 1542    Visit Number  4    Number of Visits  13    Date for PT Re-Evaluation  04/04/19    Authorization Type  Medicare/BCBS-Will need 10th visit progress note    PT Start Time  1148    PT Stop Time  1235    PT Time Calculation (min)  47 min    Equipment Utilized During Treatment  Gait belt    Activity Tolerance  Patient tolerated treatment well;No increased pain    Behavior During Therapy  WFL for tasks assessed/performed       Past Medical History:  Diagnosis Date  . Asthma    daily inhaler  . Breast cancer ([place])    left- radiation and surgery -dx. 2011- no further tx. now- [name] , [name]  . Cyst of finger 11/2011   annular cyst right long finger  . Dental crowns present   . Dermatitis   . Family history of bladder cancer   . Family history of colon cancer   . Family history of leukemia   . Family history of ovarian cancer   . Frequency of urination   . GERD (gastroesophageal reflux disease)   . Hemorrhoid   . Hyperlipidemia   . Hypertension    under control, has been on med. x 2 yrs.  [name] PONV (postoperative nausea and vomiting)   . Seasonal allergies   . Tremors of nervous system    hands  . Trigger finger of right hand 11/2011   long finger    Past Surgical History:  Procedure Laterality Date  . ANTERIOR CERVICAL DECOMPRESSION/DISCECTOMY FUSION 4 LEVELS N/A 11/14/2013   Procedure: ANTERIOR CERVICAL DECOMPRESSION/DISCECTOMY FUSION 4 LEVELS;  Surgeon: Newman Pies, MD;  Location: [facility];  Service: Neurosurgery;  Laterality: N/A;  C34 C45 C56 C67 anterior cervical fusion with  interbody prosthesis plating and  bonegraft  . APPENDECTOMY  age 11  . BREAST LUMPECTOMY  06/16/2009   left; SLN bx.  [name] BREAST LUMPECTOMY  07/01/2009   re-excision  . BREAST SURGERY  1999   reduction  . CATARACT EXTRACTION, BILATERAL    . COLONOSCOPY WITH PROPOFOL N/A 12/03/2014   Procedure: COLONOSCOPY WITH PROPOFOL;  Surgeon: [name], MD;  Location: [facility];  Service: Endoscopy;  Laterality: N/A;  . FOOT SURGERY  06/22/11   left  . KNEE ARTHROSCOPY  03/12/2005   right  . KNEE ARTHROSCOPY     left  . NASAL SINUS SURGERY     x 2  . 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 TRIGGER FINGER RELEASE  12/16/2011   Procedure: RELEASE TRIGGER FINGER/A-1 PULLEY;  Surgeon: Tennis Must, MD;  Location: [facility];  Service: Orthopedics;  Laterality: Right;  RIGHT LONG FINGER TRIGGER RELEASE & ANNULAR CYST EXCISION  . TUMOR EXCISION  age 46   right arm    There were no vitals filed for this visit.  Subjective Assessment - 02/20/19 1149    Subjective  Doing okay today.  Legs are hurting from the knees down.  Got the injection, and it took care of it, but  now the pain has started from knees down.  More difficult for sit>stand from car or from chair    Pertinent History  Hx of Parkinson's disease; donated kidney to husband; hx of breast cancer    How long can you walk comfortably?  [place] and back; and that's the most she can do before leg pain limits    Diagnostic tests  MRI 09/2018, shows multi leve disc bulge lumbar spine    Patient Stated Goals  Pt's goal for therapy is to be more sure of balance and have less pain.    Currently in Pain?  Yes    Pain Score  8     Pain Location  Calf    Pain Orientation  Right;Left    Pain Descriptors / Indicators  Aching;Cramping    Pain Type  Acute pain    Pain Onset  1 to 4 weeks ago    Pain Frequency  Intermittent    Aggravating Factors   worse first  things in the mornings    Pain Relieving Factors  better as the day goes on    Effect of Pain on Daily Activities  PT will monitor, but this pain is different from original c/o back and radiating leg pain                       [facility] Adult PT Treatment/Exercise - 02/20/19 0001      Transfers   Transfers  Sit to Stand;Stand to Sit    Sit to Stand  5: Supervision;With upper extremity assist;From chair/3-in-1;From bed;Without upper extremity assist    Stand to Sit  5: Supervision;With upper extremity assist;To chair/3-in-1;To bed;Without upper extremity assist    Comments  Sit<>stand from 18" chair, initiall without UE support, then transitioned to use of UE support, cues for correct foot positioning, scooting and forward lean for improved transfer.  Then transitioned to elevated mat surface (simulating bed height) x 5 reps, 2 sets no UE support with cues for positioning, forward lean, for improved lower extremity strengthening       Ambulation/Gait   Ambulation/Gait  Yes    Ambulation/Gait Assistance  7: Independent    Ambulation Distance (Feet)  345 Feet   80 ft x 2   Assistive device  None    Gait Pattern  Step-through pattern;Decreased arm swing - right;Decreased step length - right;Narrow base of support    Ambulation Surface  Level;Indoor      Exercises   Exercises  Knee/Hip      Knee/Hip Exercises: Stretches   Active Hamstring Stretch  Right;Left;3 reps;30 seconds    Active Hamstring Stretch Limitations  Cues for technique; foot propped at 4" block    Gastroc Stretch  Right;Left;3 reps;30 seconds   foot propped in standing at 2" block   Gastroc Stretch Limitations  Attempted runner's stretch and foot propped at 4" step-difficulty attaining correct stretch positions      Knee/Hip Exercises: Standing   Other Standing Knee Exercises  Stagger stance forward/back rocking x 10 reps each foot position, to work on dynamic ankle dorsiflexion       Knee/Hip Exercises:  Seated   Long Arc Quad  AROM;Right;Left;2 sets;5 reps    Long Arc Quad Limitations  gentle kicking forward/back as means for lubricating knee joing; explained "motion is lotion" for benefits of gentle ROM.    Other Seated Knee/Hip Exercises  Seated ankle pumps x 10 reps  PT Education - 02/20/19 1541    Education Details  Updates to HEP; importance of consistent stretching for improved mobility and (hopefully) decreased BLE pain    Person(s) Educated  Patient    Methods  Explanation;Demonstration;Handout    Comprehension  Verbalized understanding;Returned demonstration       PT Short Term Goals - 02/07/19 1410      PT SHORT TERM GOAL #1   Title  Pt will be independent with HEP for decreased pain and improved functional strength and gait.  TARGET 02/02/2019 (may be delayed due to scheduling around holidays)    Baseline  02/07/19: met with current HEP    Status  Achieved    Target Date  --      PT SHORT TERM GOAL #2   Title  Pt will improve 5x sit<>stand to less than or equal to 17 seconds for improved functional lower extremity strength.    Baseline  02/07/19: 15. 25 sec's no UE support using standard height chair    Time  --    Period  --    Status  Achieved    Target Date  --      PT SHORT TERM GOAL #3   Title  Pt will improve R hamstring ROM by at least 10 degrees for improved flexibility, decreased RLE pain.    Baseline  02/07/19: 65 degrees actively today    Time  --    Period  --    Status  Achieved    Target Date  02/02/19      PT SHORT TERM GOAL #4   Title  Pt will be able to ambulate > 5 minutes x 2 (simulating her neighborhood) without pain increase in low back and legs.    Baseline  02/07/19: met in session today    Time  --    Period  --    Status  Achieved    Target Date  02/02/19      PT SHORT TERM GOAL #5   Title  Pt will verbalize understanding of posture/positioning with ADLs and sleep to decrease back pain with funcitonal activities.     Baseline  02/07/19: has been using pillows for support with sleeping with it helping to reduce pain    Status  Achieved    Target Date  02/02/19        PT Long Term Goals - 01/04/19 1607      PT LONG TERM GOAL #1   Title  Pt will be independent with progression of HEP for decreased pain, improved mobility, strength, balance.  (03/09/2019)-extended, 6 weeks for all LTGs    Time  6    Period  Weeks    Status  New      PT LONG TERM GOAL #2   Title  FGA to be assessed, with goal to be written as apporpriate.    Time  6    Period  Weeks    Status  New      PT LONG TERM GOAL #3   Title  Pt will improve 5x sit<>stand to less than or equal to 15 seconds for improved functional lower extremity strength.    Time  6    Period  Weeks    Status  New      PT LONG TERM GOAL #4   Title  Pt will improve gait velocity to at least 3 ft/sec for improved gait efficiency and safety in community.    Time  6  Period  Weeks    Status  New      PT LONG TERM GOAL #5   Title  Pt will report at least 10 percentage point decrease in Oswestry Back pain score, to demonstrate improved low back pain with funcitonal mobility and daily tasks.    Baseline  Eval:  Oswestry 32%    Period  Weeks    Status  New            Plan - 02/20/19 1548    Clinical Impression Statement  This week is wk 4 of 6 in her POC.  She has been inconsistent with scheduling due to husband's health issues and holiday scheduling.  She returns today with overall improved back and radiating pain; however, new pain is in bilateral calves and lower legs (she is not sure this is back related).  Addressed with exercises to focus on static and dynamic stretches for hamstrings and gastrocs for improved overall mobility and decreased pain.  She continues to be motivated for therapy and is consistently scheduled going forward to address pain and fucntional mobility.    Personal Factors and Comorbidities  Comorbidity 3+    Comorbidities   Parkinson's disease, breast cancer; gave kidney to husband for transplant    Examination-Activity Limitations  Bed Mobility;Transfers;Sit;Stand;Locomotion Level    Examination-Participation Restrictions  Meal Prep;Community Activity   Walking in neighborhood   Stability/Clinical Decision Making  Evolving/Moderate complexity    Rehab Potential  Good    PT Frequency  2x / week    PT Duration  6 weeks   plus eval   PT Treatment/Interventions  ADLs/Self Care Home Management;Electrical Stimulation;Ultrasound;Traction;Moist Heat;Gait training;Functional mobility training;Therapeutic activities;Balance training;Therapeutic exercise;Neuromuscular re-education;Patient/family education    PT Next Visit Plan  Assess FGA (and route to PT for adding goal); this is week 4 of 6; at week 6-will check LTGs and renew if needed.  Focus on posture,balance, lower extremity strengthening and flexibility; review HEP added this visit    Consulted and Agree with Plan of Care  Patient       Patient will benefit from skilled therapeutic intervention in order to improve the following deficits and impairments:  Abnormal gait, Decreased mobility, Decreased balance, Difficulty walking, Decreased strength, Postural dysfunction, Impaired flexibility  Visit Diagnosis: Muscle weakness (generalized)  Unsteadiness on feet  Other abnormalities of gait and mobility     Problem List Patient Active Problem List   Diagnosis Date Noted  . Genetic testing 02/13/2019  . Family history of ovarian cancer   . Family history of bladder cancer   . Family history of colon cancer   . Family history of leukemia   . Lumbar radiculopathy 12/08/2018  . Myofascial pain 12/08/2018  . Impaired gait and mobility 12/08/2018  . 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 Chronic nonallergic rhinitis 12/29/2016  . Mild persistent asthma, uncomplicated 25/00/3704  . 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 Gastroesophageal reflux disease  12/29/2016  . Cervical spondylosis with myelopathy and radiculopathy 11/14/2013  . Essential tremor 06/13/2013  . Cervical spondylosis without myelopathy 06/13/2013  . Breast cancer of lower-outer quadrant of left female breast ([place]) 09/10/2010    Rylynn Kobs W. 02/20/2019, 4:03 PM  Frazier Butt., PT   [facility] [address] [place], [place], [address] Phone: [phone]   Fax:  [phone]  Name: AERICA RINCON MRN: [id number] Date of Birth: 09-22-1940

## 2019-02-20 NOTE — Therapy (Signed)
[facility] [address] [place], [place], [address] Phone: [phone]   Fax:  [phone]  Occupational Therapy Treatment  Patient Details  Name: April Church MRN: [id number] Date of Birth: Feb 12, 1941 Referring Provider (OT): Dr. Andrey Spearman   Encounter Date: 02/20/2019  OT End of Session - 02/20/19 1250    Visit Number  2    Number of Visits  13    Date for OT Re-Evaluation  03/24/19    Authorization Type  Medicare & BCBS Supplement, covered 100%    Authorization Time Period  02/07/19-05/06/19    Authorization - Visit Number  2    Authorization - Number of Visits  10    OT Start Time  [id number]   pt was mistakenly not arrived by front desk until after session started, pt had PT prior to OT.   OT Stop Time  1315    OT Time Calculation (min)  40 min    Activity Tolerance  Patient tolerated treatment well    Behavior During Therapy  WFL for tasks assessed/performed       Past Medical History:  Diagnosis Date  . Asthma    daily inhaler  . Breast cancer ([place])    left- radiation and surgery -dx. 2011- no further tx. now- [name] , [name]  . Cyst of finger 11/2011   annular cyst right long finger  . Dental crowns present   . Dermatitis   . Family history of bladder cancer   . Family history of colon cancer   . Family history of leukemia   . Family history of ovarian cancer   . Frequency of urination   . GERD (gastroesophageal reflux disease)   . Hemorrhoid   . Hyperlipidemia   . Hypertension    under control, has been on med. x 2 yrs.  [name] PONV (postoperative nausea and vomiting)   . Seasonal allergies   . Tremors of nervous system    hands  . Trigger finger of right hand 11/2011   long finger    Past Surgical History:  Procedure Laterality Date  . ANTERIOR CERVICAL DECOMPRESSION/DISCECTOMY FUSION 4 LEVELS N/A 11/14/2013   Procedure: ANTERIOR CERVICAL DECOMPRESSION/DISCECTOMY FUSION 4  LEVELS;  Surgeon: Newman Pies, MD;  Location: [facility];  Service: Neurosurgery;  Laterality: N/A;  C34 C45 C56 C67 anterior cervical fusion with interbody prosthesis plating and  bonegraft  . APPENDECTOMY  age 9  . BREAST LUMPECTOMY  06/16/2009   left; SLN bx.  [name] BREAST LUMPECTOMY  07/01/2009   re-excision  . BREAST SURGERY  1999   reduction  . CATARACT EXTRACTION, BILATERAL    . COLONOSCOPY WITH PROPOFOL N/A 12/03/2014   Procedure: COLONOSCOPY WITH PROPOFOL;  Surgeon: [name], MD;  Location: [facility];  Service: Endoscopy;  Laterality: N/A;  . FOOT SURGERY  06/22/11   left  . KNEE ARTHROSCOPY  03/12/2005   right  . KNEE ARTHROSCOPY     left  . NASAL SINUS SURGERY     x 2  . 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 TRIGGER FINGER RELEASE  12/16/2011   Procedure: RELEASE TRIGGER FINGER/A-1 PULLEY;  Surgeon: Tennis Must, MD;  Location: [facility];  Service: Orthopedics;  Laterality: Right;  RIGHT LONG FINGER TRIGGER RELEASE & ANNULAR CYST EXCISION  . TUMOR EXCISION  age 46   right arm    There were no vitals filed for  this visit.  Subjective Assessment - 02/20/19 1241    Subjective   pt reports that back pain was better after injection, but has come back some.  Pt reports that she decided not to get DBS.  Pt eports writing at times is difficult and today is a good day.    Pertinent History  Parkinson's disease.   PMH:  hx of kidney donor, hx of L breast CA, ET, cervical spondylosis and cervical decompression/fusion surgery, hx of R 3rd digit trigger finger release and cyst, HTN, hyperlipidemia, hx of RUE tumor excision, lumbar radiculopathy (currently being treated for this by Dr. Letta Pate and PT)    Limitations  currently being treated for lumbar radiculopathy    Patient Stated Goals  improve coordination, writing, R hand function    Currently in Pain?  Yes    Pain Score  1     Pain Location  Leg    Pain  Orientation  Right;Left    Pain Descriptors / Indicators  Aching    Pain Type  Chronic pain    Pain Onset  More than a month ago    Pain Frequency  Intermittent    Aggravating Factors   laying in bed    Pain Relieving Factors  injections               Treatment: Therapist assessed shoulder flexion/ elbow ext and set goal. Initial HEP issued. See pt instructions, min-mod v.c for larger amplitude movements and technique.            OT Education - 02/20/19 1330    Education Details  coordination HEP, PWR! hands basic 4 10 reps each    Person(s) Educated  Patient    Methods  Explanation;Demonstration;Verbal cues;Handout    Comprehension  Verbalized understanding;Returned demonstration;Verbal cues required       OT Short Term Goals - 02/20/19 1243      OT SHORT TERM GOAL #1   Title  Pt will be independent with updated HEP--check STGs  03/09/19.    Time  4    Period  Weeks    Status  On-going      OT SHORT TERM GOAL #2   Title  Pt will be able to write short paragraph/check with 100% legibility using strategies/AE prn.    Time  4    Period  Weeks    Status  On-going      OT SHORT TERM GOAL #3   Title  Pt will demonstrate ability to retrieve items from overhead shelf with -10 elbow extension for bilateral UE's    Baseline  RUE sh. flex 130, elbow ext -15 LUE sh. flex  125 elbow ext -15    Time  4    Period  Weeks    Status  On-going      OT SHORT TERM GOAL #4   Title  Pt will be able to fasten/unfasten 3 buttons in less than 38sec.    Baseline  42.84    Time  4    Period  Weeks    Status  On-going      OT SHORT TERM GOAL #5   Title  ----        OT Long Term Goals - 02/20/19 1244      OT LONG TERM GOAL #1   Title  Pt will verbalize understanding of strategies/AE to incr ease with ADLs.--check LTGs 03/25/19    Time  6    Period  Weeks  Status  On-going      OT LONG TERM GOAL #2   Title  Pt will improve coordination for ADLs as shown by  improving time on 9-hole peg test by at least 3sec with R hand.    Baseline  40.72    Time  6    Period  Weeks    Status  On-going      OT LONG TERM GOAL #3   Title  Pt will improve coordination/functional reaching for ADLs as shown by improving score on box and blocks with RUE by at least 4 blocks    Baseline  30 blocks    Time  6    Period  Weeks    Status  On-going      OT LONG TERM GOAL #4   Title  Pt will be able to pick up small objects using tip pinch at least 75% of the time with R hand.    Time  6    Period  Weeks    Status  On-going      OT LONG TERM GOAL #5   Title  ----            Plan - 02/20/19 1332    Clinical Impression Statement  Pt is progresing towards goals. Therapist issued coordination HEP and PWR! hands today.    OT Occupational Profile and History  Detailed Assessment- Review of Records and additional review of physical, cognitive, psychosocial history related to current functional performance    Occupational performance deficits (Please refer to evaluation for details):  ADL's;IADL's;Leisure    Body Structure / Function / Physical Skills  ADL;Dexterity;ROM;IADL;Balance;Mobility;Strength;Tone;FMC;Coordination;Pain;UE functional use;GMC;Improper spinal/pelvic alignment;Endurance    Rehab Potential  Good    Clinical Decision Making  Several treatment options, min-mod task modification necessary    Comorbidities Affecting Occupational Performance:  May have comorbidities impacting occupational performance    Modification or Assistance to Complete Evaluation   Min-Moderate modification of tasks or assist with assess necessary to complete eval    OT Frequency  2x / week    OT Duration  6 weeks   +eval   OT Treatment/Interventions  Self-care/ADL training;Moist Heat;Fluidtherapy;DME and/or AE instruction;Splinting;Balance training;Therapeutic activities;Aquatic Therapy;Ultrasound;Therapeutic exercise;Cognitive remediation/compensation;Passive range of  motion;Functional Mobility Training;Neuromuscular education;Energy conservation;Manual Therapy;Patient/family education;Paraffin    Plan  check on medication schedule, consider PWR! basic 4 seated    Consulted and Agree with Plan of Care  Patient       Patient will benefit from skilled therapeutic intervention in order to improve the following deficits and impairments:   Body Structure / Function / Physical Skills: ADL, Dexterity, ROM, IADL, Balance, Mobility, Strength, Tone, FMC, Coordination, Pain, UE functional use, GMC, Improper spinal/pelvic alignment, Endurance       Visit Diagnosis: Other symptoms and signs involving the nervous system  Other symptoms and signs involving the musculoskeletal system  Other lack of coordination  Abnormal posture  Muscle weakness (generalized)  Tremor    Problem List Patient Active Problem List   Diagnosis Date Noted  . Genetic testing 02/13/2019  . Family history of ovarian cancer   . Family history of bladder cancer   . Family history of colon cancer   . Family history of leukemia   . Lumbar radiculopathy 12/08/2018  . Myofascial pain 12/08/2018  . Impaired gait and mobility 12/08/2018  . 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 Chronic nonallergic rhinitis 12/29/2016  . Mild persistent asthma, uncomplicated [id number]  . 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 Gastroesophageal  reflux disease 12/29/2016  . Cervical spondylosis with myelopathy and radiculopathy 11/14/2013  . Essential tremor 06/13/2013  . Cervical spondylosis without myelopathy 06/13/2013  . Breast cancer of lower-outer quadrant of left female breast ([place]) 09/10/2010    April Church 02/20/2019, 1:35 PM  [facility] [address] [place], [place], [address] Phone: [phone]   Fax:  [phone]  Name: April Church MRN: [id number] Date of Birth: [birth year]

## 2019-02-20 NOTE — Patient Instructions (Signed)
Coordination Exercises  Perform the following exercises for 20 minutes 1 times per day. Perform with both hand(s). Perform using big movements.   Flipping Cards: Place deck of cards on the table. Flip cards over by opening your hand big to grasp and then turn your palm up big.  Deal cards: Hold 1/2 or whole deck in your hand. Use thumb to push card off top of deck with one big push.  Rotate ball with fingertips: Pick up with fingers/thumb and move as much as you can with each turn/movement (clockwise and counter-clockwise).  Toss ball from one hand to the other: Toss big/high.  Toss ball in the air and catch with the same hand: Toss big/high.  Pick up coins and place in coin bank or container: Pick up with big, intentional movements. Do not drag coin to the edge.  Pick up coins and stack one at a time: Pick up with big, intentional movements. Do not drag coin to the edge. (5-10 in a stack)  Pick up 5-10 coins one at a time and hold in palm. Then, move coins from palm to fingertips one at time and place in coin bank/container.  Pick up 5-10 coins one at a time and hold in palm. Then, move coins from palm to fingertips one at a time to stack.  Practice writing: Slow down, write big, and focus on forming each letter.  Perform "Flicks"/hand stretches (PWR! Hands): Close hands then flick out your fingers with focus on opening hands, pulling wrists back, and extending elbows like you are pushing.   PWR! Hands  With arms stretched out in front of you (elbows straight), perform the following:  PWR! Rock: Move wrists up and down [employer]! Twist: Twist palms up and down BIG  Then, start with elbows bent and hands closed.  PWR! Step: Touch index finger to thumb while keeping other fingers straight. Flick fingers out BIG (thumb out/straighten fingers). Repeat with other fingers. (Step your thumb to each finger).  PWR! Hands: Push hands out BIG. Elbows straight, wrists up, fingers open and  spread apart BIG. (Can also perform by pushing down on table, chair, knees. Push above head, out to the side, behind you, in front of you.)   ** Make each movement big and deliberate so that you feel the movement.  Perform at least 10 repetitions 1x/day, but perform PWR! hands throughout the day when you are having trouble using your hands (picking up/manipulating small objects, writing, eating, typing, sewing, buttoning, etc.).

## 2019-02-27 ENCOUNTER — Encounter: Payer: Self-pay | Admitting: Occupational Therapy

## 2019-02-27 ENCOUNTER — Ambulatory Visit: Payer: Medicare Other | Admitting: Occupational Therapy

## 2019-02-27 ENCOUNTER — Encounter: Payer: Self-pay | Admitting: Physical Therapy

## 2019-02-27 ENCOUNTER — Other Ambulatory Visit: Payer: Self-pay

## 2019-02-27 ENCOUNTER — Ambulatory Visit: Payer: Medicare Other | Attending: Internal Medicine | Admitting: Physical Therapy

## 2019-02-27 DIAGNOSIS — R251 Tremor, unspecified: Secondary | ICD-10-CM | POA: Insufficient documentation

## 2019-02-27 DIAGNOSIS — R293 Abnormal posture: Secondary | ICD-10-CM | POA: Diagnosis not present

## 2019-02-27 DIAGNOSIS — M6281 Muscle weakness (generalized): Secondary | ICD-10-CM | POA: Diagnosis not present

## 2019-02-27 DIAGNOSIS — R2681 Unsteadiness on feet: Secondary | ICD-10-CM

## 2019-02-27 DIAGNOSIS — R2689 Other abnormalities of gait and mobility: Secondary | ICD-10-CM

## 2019-02-27 DIAGNOSIS — R278 Other lack of coordination: Secondary | ICD-10-CM | POA: Insufficient documentation

## 2019-02-27 DIAGNOSIS — R29898 Other symptoms and signs involving the musculoskeletal system: Secondary | ICD-10-CM

## 2019-02-27 DIAGNOSIS — R29818 Other symptoms and signs involving the nervous system: Secondary | ICD-10-CM | POA: Diagnosis not present

## 2019-02-27 NOTE — Therapy (Addendum)
[facility] [address] [place], [place], [address] Phone: [phone]   Fax:  [phone]  Occupational Therapy Treatment  Patient Details  Name: April Church MRN: [id number] Date of Birth: 1940/10/02 Referring Provider (OT): Dr. Andrey Spearman   Encounter Date: 02/27/2019  OT End of Session - 02/27/19 1332    Visit Number  3    Number of Visits  13    Date for OT Re-Evaluation  03/24/19    Authorization Type  Medicare & BCBS Supplement, covered 100%    Authorization Time Period  02/07/19-05/06/19    Authorization - Visit Number  3    Authorization - Number of Visits  10    OT Start Time  1330    OT Stop Time  1402    OT Time Calculation (min)  32 min    Activity Tolerance  Patient tolerated treatment well    Behavior During Therapy  [facility] for tasks assessed/performed       Past Medical History:  Diagnosis Date  . Asthma    daily inhaler  . Breast cancer ([place])    left- radiation and surgery -dx. 2011- no further tx. now- [name] , [name]  . Cyst of finger 11/2011   annular cyst right long finger  . Dental crowns present   . Dermatitis   . Family history of bladder cancer   . Family history of colon cancer   . Family history of leukemia   . Family history of ovarian cancer   . Frequency of urination   . GERD (gastroesophageal reflux disease)   . Hemorrhoid   . Hyperlipidemia   . Hypertension    under control, has been on med. x 2 yrs.  [name] PONV (postoperative nausea and vomiting)   . Seasonal allergies   . Tremors of nervous system    hands  . Trigger finger of right hand 11/2011   long finger    Past Surgical History:  Procedure Laterality Date  . ANTERIOR CERVICAL DECOMPRESSION/DISCECTOMY FUSION 4 LEVELS N/A 11/14/2013   Procedure: ANTERIOR CERVICAL DECOMPRESSION/DISCECTOMY FUSION 4 LEVELS;  Surgeon: Newman Pies, MD;  Location: [facility];  Service: Neurosurgery;  Laterality:  N/A;  C34 C45 C56 C67 anterior cervical fusion with interbody prosthesis plating and  bonegraft  . APPENDECTOMY  age 6  . BREAST LUMPECTOMY  06/16/2009   left; SLN bx.  [name] BREAST LUMPECTOMY  07/01/2009   re-excision  . BREAST SURGERY  1999   reduction  . CATARACT EXTRACTION, BILATERAL    . COLONOSCOPY WITH PROPOFOL N/A 12/03/2014   Procedure: COLONOSCOPY WITH PROPOFOL;  Surgeon: [name], MD;  Location: [facility];  Service: Endoscopy;  Laterality: N/A;  . FOOT SURGERY  06/22/11   left  . KNEE ARTHROSCOPY  03/12/2005   right  . KNEE ARTHROSCOPY     left  . NASAL SINUS SURGERY     x 2  . 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 TRIGGER FINGER RELEASE  12/16/2011   Procedure: RELEASE TRIGGER FINGER/A-1 PULLEY;  Surgeon: Tennis Must, MD;  Location: [facility];  Service: Orthopedics;  Laterality: Right;  RIGHT LONG FINGER TRIGGER RELEASE & ANNULAR CYST EXCISION  . TUMOR EXCISION  age 52   right arm    There were no vitals filed for this visit.  Subjective Assessment - 02/27/19 1330    Subjective   Pt reports that she is  limping due to leg pain    Pertinent History  Parkinson's disease.   PMH:  hx of kidney donor, hx of L breast CA, ET, cervical spondylosis and cervical decompression/fusion surgery, hx of R 3rd digit trigger finger release and cyst, HTN, hyperlipidemia, hx of RUE tumor excision, lumbar radiculopathy (currently being treated for this by Dr. Letta Pate and PT)    Limitations  currently being treated for lumbar radiculopathy    Patient Stated Goals  improve coordination, writing, R hand function    Currently in Pain?  Yes    Pain Score  4     Pain Location  Leg   calves   Pain Orientation  Right;Left    Pain Descriptors / Indicators  Aching    Pain Type  Acute pain    Pain Onset  More than a month ago    Pain Frequency  Intermittent    Aggravating Factors   laying too long    Pain Relieving Factors  moving  around more         Practiced writing checks and thank you cards.  Pt able to print 2 checks with 100% legibility with good size/control.  Pt able to write thank you note with good size/legibility.  Recommended printing except signature.  PWR! Up and rock in sitting with min cueing for R lean (mirror also used).  Pt denies back pain.  Attempted PWR! Twist in sitting with mod cueing for turn at hips to decr stress on lower back, but pt with difficulty therefore discontinued.  In standing, trunk rotation with functional reaching across body with min cueing for rotation at hips vs. Back with good response to cueing.  Educated pt in importance incorporating this technique in functional activities at home.  Pt verbalized understanding.        OT Short Term Goals - 02/20/19 1243      OT SHORT TERM GOAL #1   Title  Pt will be independent with updated HEP--check STGs  03/09/19.    Time  4    Period  Weeks    Status  On-going      OT SHORT TERM GOAL #2   Title  Pt will be able to write short paragraph/check with 100% legibility using strategies/AE prn.    Time  4    Period  Weeks    Status  On-going      OT SHORT TERM GOAL #3   Title  Pt will demonstrate ability to retrieve items from overhead shelf with -10 elbow extension for bilateral UE's    Baseline  RUE sh. flex 130, elbow ext -15 LUE sh. flex  125 elbow ext -15    Time  4    Period  Weeks    Status  On-going      OT SHORT TERM GOAL #4   Title  Pt will be able to fasten/unfasten 3 buttons in less than 38sec.    Baseline  42.84    Time  4    Period  Weeks    Status  On-going      OT SHORT TERM GOAL #5   Title  ----        OT Long Term Goals - 02/20/19 1244      OT LONG TERM GOAL #1   Title  Pt will verbalize understanding of strategies/AE to incr ease with ADLs.--check LTGs 03/25/19    Time  6    Period  Weeks    Status  On-going  OT LONG TERM GOAL #2   Title  Pt will improve coordination for ADLs as shown  by improving time on 9-hole peg test by at least 3sec with R hand.    Baseline  40.72    Time  6    Period  Weeks    Status  On-going      OT LONG TERM GOAL #3   Title  Pt will improve coordination/functional reaching for ADLs as shown by improving score on box and blocks with RUE by at least 4 blocks    Baseline  30 blocks    Time  6    Period  Weeks    Status  On-going      OT LONG TERM GOAL #4   Title  Pt will be able to pick up small objects using tip pinch at least 75% of the time with R hand.    Time  6    Period  Weeks    Status  On-going      OT LONG TERM GOAL #5   Title  ----            Plan - 02/27/19 1332    Clinical Impression Statement  Pt is progressing towards goals, but needs cueing for posture and for adaptive techniques for trunk rotation with functional reaching to decr risk of back pain.    OT Occupational Profile and History  Detailed Assessment- Review of Records and additional review of physical, cognitive, psychosocial history related to current functional performance    Occupational performance deficits (Please refer to evaluation for details):  ADL's;IADL's;Leisure    Body Structure / Function / Physical Skills  ADL;Dexterity;ROM;IADL;Balance;Mobility;Strength;Tone;FMC;Coordination;Pain;UE functional use;GMC;Improper spinal/pelvic alignment;Endurance    Rehab Potential  Good    Clinical Decision Making  Several treatment options, min-mod task modification necessary    Comorbidities Affecting Occupational Performance:  May have comorbidities impacting occupational performance    Modification or Assistance to Complete Evaluation   Min-Moderate modification of tasks or assist with assess necessary to complete eval    OT Frequency  2x / week    OT Duration  6 weeks   +eval   OT Treatment/Interventions  Self-care/ADL training;Moist Heat;Fluidtherapy;DME and/or AE instruction;Splinting;Balance training;Therapeutic activities;Aquatic  Therapy;Ultrasound;Therapeutic exercise;Cognitive remediation/compensation;Passive range of motion;Functional Mobility Training;Neuromuscular education;Energy conservation;Manual Therapy;Patient/family education;Paraffin    Plan  check on medication schedule, continue with ADL strategies    Consulted and Agree with Plan of Care  Patient       Patient will benefit from skilled therapeutic intervention in order to improve the following deficits and impairments:   Body Structure / Function / Physical Skills: ADL, Dexterity, ROM, IADL, Balance, Mobility, Strength, Tone, FMC, Coordination, Pain, UE functional use, GMC, Improper spinal/pelvic alignment, Endurance       Visit Diagnosis: Other symptoms and signs involving the nervous system  Other symptoms and signs involving the musculoskeletal system  Other lack of coordination  Abnormal posture  Muscle weakness (generalized)  Tremor  Unsteadiness on feet  Other abnormalities of gait and mobility    Problem List Patient Active Problem List   Diagnosis Date Noted  . Genetic testing 02/13/2019  . Family history of ovarian cancer   . Family history of bladder cancer   . Family history of colon cancer   . Family history of leukemia   . Lumbar radiculopathy 12/08/2018  . Myofascial pain 12/08/2018  . Impaired gait and mobility 12/08/2018  . 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 Chronic nonallergic rhinitis 12/29/2016  .  Mild persistent asthma, uncomplicated [id number]  . 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 Gastroesophageal reflux disease 12/29/2016  . Cervical spondylosis with myelopathy and radiculopathy 11/14/2013  . Essential tremor 06/13/2013  . Cervical spondylosis without myelopathy 06/13/2013  . Breast cancer of lower-outer quadrant of left female breast [facility]) 09/10/2010    [facility] 02/27/2019, 2:52 PM  [facility] [address] [place], [place], [address] Phone: [phone]   Fax:  [phone]  Name: April Church MRN: [id number] Date of Birth: 1940-12-26   Vianne Bulls, OTR/L [facility] [address] [place], [place]  [address] [phone] phone [phone] 02/27/19 2:52 PM

## 2019-02-28 ENCOUNTER — Encounter: Payer: Self-pay | Admitting: Genetic Counselor

## 2019-02-28 NOTE — Therapy (Addendum)
[facility] [address] [place], [place], [address] Phone: [phone]   Fax:  [phone]  Physical Therapy Treatment  Patient Details  Name: April Church MRN: [id number] Date of Birth: 1940-11-09 Referring Provider (PT): Courtney Heys   Encounter Date: 02/27/2019  PT End of Session - 02/27/19 1405    Visit Number  5    Number of Visits  13    Date for PT Re-Evaluation  04/04/19    Authorization Type  Medicare/BCBS-Will need 10th visit progress note    PT Start Time  1402    PT Stop Time  1442    PT Time Calculation (min)  40 min    Equipment Utilized During Treatment  Gait belt    Activity Tolerance  Patient tolerated treatment well;No increased pain    Behavior During Therapy  WFL for tasks assessed/performed       Past Medical History:  Diagnosis Date  . Asthma    daily inhaler  . Breast cancer ([place])    left- radiation and surgery -dx. 2011- no further tx. now- [name] , [name]  . Cyst of finger 11/2011   annular cyst right long finger  . Dental crowns present   . Dermatitis   . Family history of bladder cancer   . Family history of colon cancer   . Family history of leukemia   . Family history of ovarian cancer   . Frequency of urination   . GERD (gastroesophageal reflux disease)   . Hemorrhoid   . Hyperlipidemia   . Hypertension    under control, has been on med. x 2 yrs.  [name] PONV (postoperative nausea and vomiting)   . Seasonal allergies   . Tremors of nervous system    hands  . Trigger finger of right hand 11/2011   long finger    Past Surgical History:  Procedure Laterality Date  . ANTERIOR CERVICAL DECOMPRESSION/DISCECTOMY FUSION 4 LEVELS N/A 11/14/2013   Procedure: ANTERIOR CERVICAL DECOMPRESSION/DISCECTOMY FUSION 4 LEVELS;  Surgeon: Newman Pies, MD;  Location: [facility];  Service: Neurosurgery;  Laterality: N/A;  C34 C45 C56 C67 anterior cervical fusion with interbody  prosthesis plating and  bonegraft  . APPENDECTOMY  age 54  . BREAST LUMPECTOMY  06/16/2009   left; SLN bx.  [name] BREAST LUMPECTOMY  07/01/2009   re-excision  . BREAST SURGERY  1999   reduction  . CATARACT EXTRACTION, BILATERAL    . COLONOSCOPY WITH PROPOFOL N/A 12/03/2014   Procedure: COLONOSCOPY WITH PROPOFOL;  Surgeon: [name], MD;  Location: [facility];  Service: Endoscopy;  Laterality: N/A;  . FOOT SURGERY  06/22/11   left  . KNEE ARTHROSCOPY  03/12/2005   right  . KNEE ARTHROSCOPY     left  . NASAL SINUS SURGERY     x 2  . 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 TRIGGER FINGER RELEASE  12/16/2011   Procedure: RELEASE TRIGGER FINGER/A-1 PULLEY;  Surgeon: Tennis Must, MD;  Location: [facility];  Service: Orthopedics;  Laterality: Right;  RIGHT LONG FINGER TRIGGER RELEASE & ANNULAR CYST EXCISION  . TUMOR EXCISION  age 38   right arm    There were no vitals filed for this visit.  Subjective Assessment - 02/27/19 1400    Subjective  Still with no back pain, moslty in the back of the calves from knees down. Has been walking more in the house  as well as it's too cold to go outside. No falls.    Pertinent History  Hx of Parkinson's disease; donated kidney to husband; hx of breast cancer    How long can you walk comfortably?  [place] and back; and that's the most she can do before leg pain limits    Diagnostic tests  MRI 09/2018, shows multi leve disc bulge lumbar spine    Patient Stated Goals  Pt's goal for therapy is to be more sure of balance and have less pain.    Currently in Pain?  Yes    Pain Score  3     Pain Location  Leg   calves   Pain Orientation  Right;Left    Pain Descriptors / Indicators  Aching    Pain Type  Acute pain    Pain Onset  1 to 4 weeks ago    Pain Frequency  Intermittent    Aggravating Factors   laying down or sitting too long    Pain Relieving Factors   moving around more    Effect of Pain on Daily Activities  PT will monitor, but this pain is different from original c/o back and radiating leg pain         [facility] PT Assessment - 02/27/19 1408      Functional Gait  Assessment   Gait assessed   Yes    Gait Level Surface  Walks 20 ft in less than 7 sec but greater than 5.5 sec, uses assistive device, slower speed, mild gait deviations, or deviates 6-10 in outside of the 12 in walkway width.   6.69 sec's   Change in Gait Speed  Able to change speed, demonstrates mild gait deviations, deviates 6-10 in outside of the 12 in walkway width, or no gait deviations, unable to achieve a major change in velocity, or uses a change in velocity, or uses an assistive device.    Gait with Horizontal Head Turns  Performs head turns smoothly with slight change in gait velocity (eg, minor disruption to smooth gait path), deviates 6-10 in outside 12 in walkway width, or uses an assistive device.    Gait with Vertical Head Turns  Performs head turns with no change in gait. Deviates no more than 6 in outside 12 in walkway width.    Gait and Pivot Turn  Pivot turns safely within 3 sec and stops quickly with no loss of balance.    Step Over Obstacle  Is able to step over one shoe box (4.5 in total height) but must slow down and adjust steps to clear box safely. May require verbal cueing.    Gait with Narrow Base of Support  Is able to ambulate for 10 steps heel to toe with no staggering.    Gait with Eyes Closed  Walks 20 ft, slow speed, abnormal gait pattern, evidence for imbalance, deviates 10-15 in outside 12 in walkway width. Requires more than 9 sec to ambulate 20 ft.   12.56 sec's   Ambulating Backwards  Walks 20 ft, uses assistive device, slower speed, mild gait deviations, deviates 6-10 in outside 12 in walkway width.    Steps  Alternating feet, must use rail.    Total Score  21            [facility] Adult PT Treatment/Exercise - 02/27/19 1406      Neuro  Re-ed    Neuro Re-ed Details   for balance/muscle re-ed:  in corner working on cross body reaching to touch targets on wall to work on increased trunk rotation/decreased stiffness. x 10 to shoulder level targets, then x 10 to above shoulder level targets (each side) with min guard assist/cues on form and technique;       Exercises   Exercises  Other Exercises    Other Exercises   at edge of mat- calf stretching with strap for 30 sec holds for 3 reps each side; then at counter runner's stretch for 30 sec holds x 2 reps each side. cues on form and technique/hold time/not to bounce with stretching.           Balance Exercises - 02/27/19 1433      Balance Exercises: Standing   Rockerboard  Anterior/posterior;Lateral;EO;EC;30 seconds;10 reps;Limitations    Rockerboard Limitations  performed both ways on balance board: rocking the board with EO, progressing to EC. Then holding the board steady for EC no head movements. Min guard to min assist for balance with touch to chair/walls as needed for balance recovery.          PT Short Term Goals - 02/07/19 1410      PT SHORT TERM GOAL #1   Title  Pt will be independent with HEP for decreased pain and improved functional strength and gait.  TARGET 02/02/2019 (may be delayed due to scheduling around holidays)    Baseline  02/07/19: met with current HEP    Status  Achieved    Target Date  --      PT SHORT TERM GOAL #2   Title  Pt will improve 5x sit<>stand to less than or equal to 17 seconds for improved functional lower extremity strength.    Baseline  02/07/19: 15. 25 sec's no UE support using standard height chair    Time  --    Period  --    Status  Achieved    Target Date  --      PT SHORT TERM GOAL #3   Title  Pt will improve R hamstring ROM by at least 10 degrees for improved flexibility, decreased RLE pain.    Baseline  02/07/19: 65 degrees actively today    Time  --    Period  --    Status  Achieved    Target Date  02/02/19       PT SHORT TERM GOAL #4   Title  Pt will be able to ambulate > 5 minutes x 2 (simulating her neighborhood) without pain increase in low back and legs.    Baseline  02/07/19: met in session today    Time  --    Period  --    Status  Achieved    Target Date  02/02/19      PT SHORT TERM GOAL #5   Title  Pt will verbalize understanding of posture/positioning with ADLs and sleep to decrease back pain with funcitonal activities.    Baseline  02/07/19: has been using pillows for support with sleeping with it helping to reduce pain    Status  Achieved    Target Date  02/02/19        PT Long Term Goals - 02/20/19 1604      PT LONG TERM GOAL #1   Title  Pt will be independent with progression of HEP for decreased pain, improved mobility, strength, balance.  03/09/2019, 6 weeks for all LTGs    Time  6    Period  Weeks  Status  New      PT LONG TERM GOAL #2   Title  FGA to be assessed, with goal to be written as apporpriate.    Time  6    Period  Weeks    Status  New      PT LONG TERM GOAL #3   Title  Pt will improve 5x sit<>stand to less than or equal to 15 seconds for improved functional lower extremity strength.    Time  6    Period  Weeks    Status  New      PT LONG TERM GOAL #4   Title  Pt will improve gait velocity to at least 3 ft/sec for improved gait efficiency and safety in community.    Time  6    Period  Weeks    Status  New      PT LONG TERM GOAL #5   Title  Pt will report at least 10 percentage point decrease in Oswestry Back pain score, to demonstrate improved low back pain with funcitonal mobility and daily tasks.    Baseline  Eval:  Oswestry 32%    Period  Weeks    Status  New            Plan - 02/27/19 1406    Clinical Impression Statement  Today's skilled session focused on setting up initial score for Functional Gait Index with pt scoring 21/30 which places her in a fall risk category. Remainder of the session addressed balance and trunk mobility due  to stiffness noted with gait/mobility. The pt is progressing toward goals and should benefit from continued PT to progress toward unmet goals.    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 transplant    Examination-Activity Limitations  Bed Mobility;Transfers;Sit;Stand;Locomotion Level    Examination-Participation Restrictions  Meal Prep;Community Activity   Walking in neighborhood   Stability/Clinical Decision Making  Evolving/Moderate complexity    Rehab Potential  Good    PT Frequency  2x / week    PT Duration  6 weeks   plus eval   PT Treatment/Interventions  ADLs/Self Care Home Management;Electrical Stimulation;Ultrasound;Traction;Moist Heat;Gait training;Functional mobility training;Therapeutic activities;Balance training;Therapeutic exercise;Neuromuscular re-education;Patient/family education    PT Next Visit Plan  at week 6-will check LTGs and renew if needed.  Focus on posture,balance, lower extremity strengthening and flexibility; review HEP added this visit    Consulted and Agree with Plan of Care  Patient       Patient will benefit from skilled therapeutic intervention in order to improve the following deficits and impairments:  Abnormal gait, Decreased mobility, Decreased balance, Difficulty walking, Decreased strength, Postural dysfunction, Impaired flexibility  Visit Diagnosis: Unsteadiness on feet  Muscle weakness (generalized)  Other abnormalities of gait and mobility     Problem List Patient Active Problem List   Diagnosis Date Noted  . Genetic testing 02/13/2019  . Family history of ovarian cancer   . Family history of bladder cancer   . Family history of colon cancer   . Family history of leukemia   . Lumbar radiculopathy 12/08/2018  . Myofascial pain 12/08/2018  . Impaired gait and mobility 12/08/2018  . 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 Chronic nonallergic rhinitis  12/29/2016  . Mild persistent asthma, uncomplicated 28/63/8177  . 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 Gastroesophageal reflux disease 12/29/2016  . Cervical spondylosis with myelopathy and radiculopathy 11/14/2013  . Essential  tremor 06/13/2013  . Cervical spondylosis without myelopathy 06/13/2013  . Breast cancer of lower-outer quadrant of left female breast ([place]) 09/10/2010    Willow Ora, PTA, [facility] [address], [place], [place] [address] [phone] 02/28/19, 9:24 PM   Name: April Church MRN: [id number] Date of Birth: [birth year]  Updated LTGs to reflect FGA goal:   03/08/19 1047  PT LONG TERM GOAL #1  Title Pt will be independent with progression of HEP for decreased pain, improved mobility, strength, balance.  03/09/2019, 6 weeks for all LTGs  Time 6  Period Weeks  Status New  PT LONG TERM GOAL #2  Title FGA score to improve to 23/30 to decrease fall risk.  Baseline 21/30 02/27/2019  Time 6  Period Weeks  Status New  PT LONG TERM GOAL #3  Title Pt will improve 5x sit<>stand to less than or equal to 15 seconds for improved functional lower extremity strength.  Time 6  Period Weeks  Status New  PT LONG TERM GOAL #4  Title Pt will improve gait velocity to at least 3 ft/sec for improved gait efficiency and safety in community.  Time 6  Period Weeks  Status New  PT LONG TERM GOAL #5  Title Pt will report at least 10 percentage point decrease in Oswestry Back pain score, to demonstrate improved low back pain with funcitonal mobility and daily tasks.  Baseline Eval:  Oswestry 32%  Period Weeks  Status Karoline Caldwell, PT 03/08/19 10:49 AM Phone: [phone] Fax: [phone]

## 2019-03-02 ENCOUNTER — Ambulatory Visit: Payer: Medicare Other | Admitting: Physical Therapy

## 2019-03-02 ENCOUNTER — Ambulatory Visit: Payer: Medicare Other | Admitting: Occupational Therapy

## 2019-03-05 ENCOUNTER — Other Ambulatory Visit: Payer: Self-pay

## 2019-03-05 ENCOUNTER — Encounter: Payer: Self-pay | Admitting: Physical Medicine and Rehabilitation

## 2019-03-05 ENCOUNTER — Encounter
Payer: Medicare Other | Attending: Physical Medicine and Rehabilitation | Admitting: Physical Medicine and Rehabilitation

## 2019-03-05 VITALS — BP 123/87 | HR 91 | Temp 97.7°F | Ht 61.5 in | Wt 122.6 lb

## 2019-03-05 DIAGNOSIS — R2689 Other abnormalities of gait and mobility: Secondary | ICD-10-CM | POA: Diagnosis not present

## 2019-03-05 DIAGNOSIS — M5416 Radiculopathy, lumbar region: Secondary | ICD-10-CM | POA: Insufficient documentation

## 2019-03-05 DIAGNOSIS — M7918 Myalgia, other site: Secondary | ICD-10-CM | POA: Insufficient documentation

## 2019-03-05 DIAGNOSIS — M79661 Pain in right lower leg: Secondary | ICD-10-CM | POA: Diagnosis not present

## 2019-03-05 DIAGNOSIS — M79662 Pain in left lower leg: Secondary | ICD-10-CM

## 2019-03-05 MED ORDER — CYCLOBENZAPRINE HCL 5 MG PO TABS: 5.0000 mg | ORAL_TABLET | Freq: Every evening | ORAL | 3 refills | Status: DC | PRN

## 2019-03-05 NOTE — Patient Instructions (Signed)
Patient is a 79 yr old female with 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 New B/L calves achiness that is very painful- not muscle spasms per pt, more achiness.   1. First, try bananas and OJ- because I've seen calve pain with Low potassium  2.  Effexor helps menopause Sx's and keeps her from fussing at her husband. Would continue the dose she's on since it's working.   3. Suggest low dose Muscle relaxant- Flexeril/Cyclobenzaprine-5 mg nightly as needed for calf achiness.- Can cause sedation- however that's why prescribing at night.    4. Unlikely to have DVT/clot in calves because is bilateral and no calf swelling.  5. Have PCP check general labs to make sure kidney/electrolytes are not the cause and encourage you to speak to PCP about  Calf pain.  6.  Will con't to  see Dr. Letta Pate for injections per his schedule.   7. Due for B.L knee steroid injections- has been 6-9 months- I will do every 3 months as needed- f/u 2 weeks

## 2019-03-05 NOTE — Progress Notes (Signed)
Subjective:    Patient ID: April Church, female    DOB: 07-Jan-1941, 79 y.o.   MRN: [id number]  HPI   After Epidural injection by Dr Letta Pate, pain on back and RLE were better.  But, B/L knees and back of calves are aching- which is new- knees hurting off and on  When gets up in the AM- can't just get up- has to get self situated even when getting out of seat- has to hold onto door/a piece of furniture, or feels like will fall.   Back of calves pain started 3 weeks ago. B/L-R a little worse than L, but both pretty noticeable.  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 Aching and only starts when stands up- aching, not like muscle spasms. Worst when gets out of bed in AM.    Hasn't gotten blood work lately but never has electrolyte issues prior.  Taking Ca 2x/day Taking low dose Magnesium- since cannot tolerate higher doses/due to loose stools Not on any fluid pills or K+.  Doing therapy 1x/week due to weather, various reasons Didn't start when started therapy.  Can't run up stairs at home anymore Doesn't need to go upstairs regularly- maybe 1x/day at most, since BR on 1st floor.   Pain Inventory Average Pain 6 Pain Right Now 3 My pain is aching  In the last 24 hours, has pain interfered with the following? General activity 5 Relation with others 0 Enjoyment of life 5 What TIME of day is your pain at its worst? morning, night  Sleep (in general) Fair  Pain is worse with: sitting and inactivity Pain improves with: heat/ice, therapy/exercise and pacing activities Relief from Meds: 0  Mobility walk without assistance ability to climb steps?  yes do you drive?  yes  Function retired  Neuro/Psych tremor spasms  Prior Studies Any changes since last visit?  no  Physicians involved in your care Any changes since last visit?  no   Family History  Problem Relation  Age of Onset  . Kidney disease Mother   . Stroke Father   . Stroke Sister        08/2015  . Diabetes Brother   . Heart disease Brother   . Ovarian cancer Sister 34  . Heart disease Paternal Grandmother   . Heart disease Paternal Grandfather   . Leukemia Other 15       brother's grandson  . Bladder Cancer Brother 41  . Colon cancer Niece        dx in late 17s   Social History   Socioeconomic History  . Marital status: Married    Spouse name: Marcello Moores   . Number of children: 1  . Years of education: HS  . Highest education level: Not on file  Occupational History  . Occupation: Retired  Tobacco Use  . Smoking status: Never Smoker  . Smokeless tobacco: Never Used  Substance and Sexual Activity  . Alcohol use: No    Alcohol/week: 0.0 standard drinks  . Drug use: No  . Sexual activity: Yes  Other Topics Concern  . Not on file  Social History Narrative      Pt lives at home with her spouse.  April Church    Patient has 1 child.    Patient is retired.    Patient has a HS education.    Patient is right handed.    Patient drinks caffeine  occasionally.              Social Determinants of Health   Financial Resource Strain:   . Difficulty of Paying Living Expenses: Not on file  Food Insecurity:   . Worried About Charity fundraiser in the Last Year: Not on file  . Ran Out of Food in the Last Year: Not on file  Transportation Needs:   . Lack of Transportation (Medical): Not on file  . Lack of Transportation (Non-Medical): Not on file  Physical Activity:   . Days of Exercise per Week: Not on file  . Minutes of Exercise per Session: Not on file  Stress:   . Feeling of Stress : Not on file  Social Connections:   . Frequency of Communication with Friends and Family: Not on file  . Frequency of Social Gatherings with Friends and Family: Not on file  . Attends Religious Services: Not on file  . Active Member of Clubs or Organizations: Not on file  . Attends Theatre manager Meetings: Not on file  . Marital Status: Not on file   Past Surgical History:  Procedure Laterality Date  . ANTERIOR CERVICAL DECOMPRESSION/DISCECTOMY FUSION 4 LEVELS N/A 11/14/2013   Procedure: ANTERIOR CERVICAL DECOMPRESSION/DISCECTOMY FUSION 4 LEVELS;  Surgeon: Newman Pies, MD;  Location: [facility];  Service: Neurosurgery;  Laterality: N/A;  C34 C45 C56 C67 anterior cervical fusion with interbody prosthesis plating and  bonegraft  . APPENDECTOMY  age 8  . BREAST LUMPECTOMY  06/16/2009   left; SLN bx.  [name] BREAST LUMPECTOMY  07/01/2009   re-excision  . BREAST SURGERY  1999   reduction  . CATARACT EXTRACTION, BILATERAL    . COLONOSCOPY WITH PROPOFOL N/A 12/03/2014   Procedure: COLONOSCOPY WITH PROPOFOL;  Surgeon: [name], MD;  Location: [facility];  Service: Endoscopy;  Laterality: N/A;  . FOOT SURGERY  06/22/11   left  . KNEE ARTHROSCOPY  03/12/2005   right  . KNEE ARTHROSCOPY     left  . NASAL SINUS SURGERY     x 2  . 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 TRIGGER FINGER RELEASE  12/16/2011   Procedure: RELEASE TRIGGER FINGER/A-1 PULLEY;  Surgeon: Tennis Must, MD;  Location: [facility];  Service: Orthopedics;  Laterality: Right;  RIGHT LONG FINGER TRIGGER RELEASE & ANNULAR CYST EXCISION  . TUMOR EXCISION  age 67   right arm   Past Medical History:  Diagnosis Date  . Asthma    daily inhaler  . Breast cancer ([place])    left- radiation and surgery -dx. 2011- no further tx. now- [name] , [name]  . Cyst of finger 11/2011   annular cyst right long finger  . Dental crowns present   . Dermatitis   . Family history of bladder cancer   . Family history of colon cancer   . Family history of leukemia   . Family history of ovarian cancer   . Frequency of urination   . GERD (gastroesophageal reflux disease)   . Hemorrhoid   . Hyperlipidemia   . Hypertension    under control, has been on med. x 2  yrs.  [name] PONV (postoperative nausea and vomiting)   . Seasonal allergies   . Tremors of nervous system    hands  . Trigger finger of right hand 11/2011   long finger   BP 123/87   Pulse 91   Temp  97.7 F (36.5 C)   Ht 5' 1.5" (1.562 m)   Wt 122 lb 9.6 oz (55.6 kg)   SpO2 96%   BMI 22.79 kg/m   Opioid Risk Score:   Fall Risk Score:  `1  Depression screen PHQ 2/9  No flowsheet data found.  Review of Systems  Constitutional: Negative.   HENT: Negative.   Eyes: Negative.   Respiratory: Negative.   Cardiovascular: Negative.   Gastrointestinal: Negative.   Endocrine: Negative.   Genitourinary: Negative.   Musculoskeletal: Positive for arthralgias and myalgias.       Spasms   Skin: Negative.   Allergic/Immunologic: Negative.   Neurological: Positive for speech difficulty.  Hematological: Negative.   All other systems reviewed and are negative.      Objective:   Physical Exam  Awake, alert, appropriate, NAD Sitting on table Calves pretty loose- not tight muscle on exam in posterior or anterior calves Some soreness/TTP over peroneus longus and brevus on B/L side, but not primary cause of pain Crepitus in knee B/L- but otherwise good ROM of B/L knees Has small Baker's cyst on R; not really on L Intermittent tremor in voice No swelling of calves over last visit.      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 New B/L calves achiness that is very painful- not muscle spasms per pt, more achiness.   1. First, try bananas and OJ- because I've seen calve pain with Low potassium  2.  Effexor helps menopause Sx's and keeps her from fussing at her husband. Would continue the dose she's on  since it's working.   3. Suggest low dose Muscle relaxant- Flexeril/Cyclobenzaprine-5 mg nightly as needed for calf achiness.- Can cause sedation- however that's why prescribing at night.    4. Unlikely to have DVT/clot in calves because is bilateral and no calf swelling.  5. Have PCP check general labs to make sure kidney/electrolytes are not the cause and encourage you to speak to PCP about  Calf pain.  6.  Will con't to  see Dr. Letta Pate for injections per his schedule.   7. Due for B.L knee steroid injections- has been 6-9 months- I will do every 3 months as needed- f/u 2 weeks.    I spent a total of 35 minutes on appointment- more than 20 minutes discussing options for calf pain and knee injections.

## 2019-03-07 ENCOUNTER — Ambulatory Visit: Payer: Medicare Other | Admitting: Occupational Therapy

## 2019-03-07 ENCOUNTER — Encounter: Payer: Self-pay | Admitting: Occupational Therapy

## 2019-03-07 ENCOUNTER — Other Ambulatory Visit: Payer: Self-pay

## 2019-03-07 ENCOUNTER — Encounter: Payer: Self-pay | Admitting: Physical Therapy

## 2019-03-07 ENCOUNTER — Ambulatory Visit: Payer: Medicare Other | Admitting: Physical Therapy

## 2019-03-07 DIAGNOSIS — R2689 Other abnormalities of gait and mobility: Secondary | ICD-10-CM

## 2019-03-07 DIAGNOSIS — R293 Abnormal posture: Secondary | ICD-10-CM

## 2019-03-07 DIAGNOSIS — M6281 Muscle weakness (generalized): Secondary | ICD-10-CM

## 2019-03-07 DIAGNOSIS — R251 Tremor, unspecified: Secondary | ICD-10-CM

## 2019-03-07 DIAGNOSIS — R29818 Other symptoms and signs involving the nervous system: Secondary | ICD-10-CM

## 2019-03-07 DIAGNOSIS — R278 Other lack of coordination: Secondary | ICD-10-CM | POA: Diagnosis not present

## 2019-03-07 DIAGNOSIS — R29898 Other symptoms and signs involving the musculoskeletal system: Secondary | ICD-10-CM

## 2019-03-07 DIAGNOSIS — R2681 Unsteadiness on feet: Secondary | ICD-10-CM

## 2019-03-07 NOTE — Therapy (Signed)
[facility] [address] [place], [place], [address] Phone: [phone]   Fax:  [phone]  Occupational Therapy Treatment  Patient Details  Name: April Church MRN: [id number] Date of Birth: 11/14/1940 Referring Provider (OT): Dr. Andrey Spearman   Encounter Date: 03/07/2019  OT End of Session - 03/07/19 1325    Visit Number  4    Number of Visits  13    Date for OT Re-Evaluation  03/24/19    Authorization Type  Medicare & BCBS Supplement, covered 100%    Authorization Time Period  02/07/19-05/06/19    Authorization - Visit Number  4    Authorization - Number of Visits  10    OT Start Time  1320    OT Stop Time  1400    OT Time Calculation (min)  40 min    Activity Tolerance  Patient tolerated treatment well    Behavior During Therapy  [facility] for tasks assessed/performed       Past Medical History:  Diagnosis Date  . Asthma    daily inhaler  . Breast cancer ([place])    left- radiation and surgery -dx. 2011- no further tx. now- [name] , [name]  . Cyst of finger 11/2011   annular cyst right long finger  . Dental crowns present   . Dermatitis   . Family history of bladder cancer   . Family history of colon cancer   . Family history of leukemia   . Family history of ovarian cancer   . Frequency of urination   . GERD (gastroesophageal reflux disease)   . Hemorrhoid   . Hyperlipidemia   . Hypertension    under control, has been on med. x 2 yrs.  [name] PONV (postoperative nausea and vomiting)   . Seasonal allergies   . Tremors of nervous system    hands  . Trigger finger of right hand 11/2011   long finger    Past Surgical History:  Procedure Laterality Date  . ANTERIOR CERVICAL DECOMPRESSION/DISCECTOMY FUSION 4 LEVELS N/A 11/14/2013   Procedure: ANTERIOR CERVICAL DECOMPRESSION/DISCECTOMY FUSION 4 LEVELS;  Surgeon: Newman Pies, MD;  Location: [facility];  Service: Neurosurgery;  Laterality:  N/A;  C34 C45 C56 C67 anterior cervical fusion with interbody prosthesis plating and  bonegraft  . APPENDECTOMY  age 79  . BREAST LUMPECTOMY  06/16/2009   left; SLN bx.  [name] BREAST LUMPECTOMY  07/01/2009   re-excision  . BREAST SURGERY  1999   reduction  . CATARACT EXTRACTION, BILATERAL    . COLONOSCOPY WITH PROPOFOL N/A 12/03/2014   Procedure: COLONOSCOPY WITH PROPOFOL;  Surgeon: [name], MD;  Location: [facility];  Service: Endoscopy;  Laterality: N/A;  . FOOT SURGERY  06/22/11   left  . KNEE ARTHROSCOPY  03/12/2005   right  . KNEE ARTHROSCOPY     left  . NASAL SINUS SURGERY     x 2  . 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 TRIGGER FINGER RELEASE  12/16/2011   Procedure: RELEASE TRIGGER FINGER/A-1 PULLEY;  Surgeon: Tennis Must, MD;  Location: [facility];  Service: Orthopedics;  Laterality: Right;  RIGHT LONG FINGER TRIGGER RELEASE & ANNULAR CYST EXCISION  . TUMOR EXCISION  age 79   right arm    There were no vitals filed for this visit.  Subjective Assessment - 03/07/19 1323    Subjective   Pt reports that she is  limping due to leg pain    Pertinent History  Parkinson's disease.   PMH:  hx of kidney donor, hx of L breast CA, ET, cervical spondylosis and cervical decompression/fusion surgery, hx of R 3rd digit trigger finger release and cyst, HTN, hyperlipidemia, hx of RUE tumor excision, lumbar radiculopathy (currently being treated for this by Dr. Letta Pate and PT)    Limitations  currently being treated for lumbar radiculopathy    Patient Stated Goals  improve coordination, writing, R hand function    Currently in Pain?  Yes    Pain Score  4     Pain Location  Leg    Pain Orientation  Right;Left    Pain Descriptors / Indicators  Aching    Pain Type  Acute pain    Pain Radiating Towards  from back of leg/calf    Pain Onset  More than a month ago    Pain Frequency  Intermittent    Aggravating Factors   laying down  or sitting too long    Pain Relieving Factors  moving around more         Practiced fastening/unfastening buttons with min difficulty/incr time.  Placing grooved pegs in pegboard with each hand with min-mod difficulty R hand (cueing for tip pinch), min difficulty L hand.  Mid-high range functional reaching with RUE to place/remove clothespins with 1-8lb resistance on vertical pole for incr pinch strength and ROM.         OT Education - 03/07/19 1325    Education Details  Recommended pt note time with pain in LEs to see if it relates to PD med off times.  Also, reviewed/discussed medication timing (pt reports improvement when taking last dose earlier and even timing)    Person(s) Educated  Patient    Methods  Explanation    Comprehension  Verbalized understanding       OT Short Term Goals - 02/20/19 1243      OT SHORT TERM GOAL #1   Title  Pt will be independent with updated HEP--check STGs  03/09/19.    Time  4    Period  Weeks    Status  On-going      OT SHORT TERM GOAL #2   Title  Pt will be able to write short paragraph/check with 100% legibility using strategies/AE prn.    Time  4    Period  Weeks    Status  On-going      OT SHORT TERM GOAL #3   Title  Pt will demonstrate ability to retrieve items from overhead shelf with -10 elbow extension for bilateral UE's    Baseline  RUE sh. flex 130, elbow ext -15 LUE sh. flex  125 elbow ext -15    Time  4    Period  Weeks    Status  On-going      OT SHORT TERM GOAL #4   Title  Pt will be able to fasten/unfasten 3 buttons in less than 38sec.    Baseline  42.84    Time  4    Period  Weeks    Status  On-going      OT SHORT TERM GOAL #5   Title  ----        OT Long Term Goals - 02/20/19 1244      OT LONG TERM GOAL #1   Title  Pt will verbalize understanding of strategies/AE to incr ease with ADLs.--check LTGs 03/25/19    Time  6  Period  Weeks    Status  On-going      OT LONG TERM GOAL #2   Title  Pt will  improve coordination for ADLs as shown by improving time on 9-hole peg test by at least 3sec with R hand.    Baseline  40.72    Time  6    Period  Weeks    Status  On-going      OT LONG TERM GOAL #3   Title  Pt will improve coordination/functional reaching for ADLs as shown by improving score on box and blocks with RUE by at least 4 blocks    Baseline  30 blocks    Time  6    Period  Weeks    Status  On-going      OT LONG TERM GOAL #4   Title  Pt will be able to pick up small objects using tip pinch at least 75% of the time with R hand.    Time  6    Period  Weeks    Status  On-going      OT LONG TERM GOAL #5   Title  ----            Plan - 03/07/19 1326    OT Occupational Profile and History  Detailed Assessment- Review of Records and additional review of physical, cognitive, psychosocial history related to current functional performance    Occupational performance deficits (Please refer to evaluation for details):  ADL's;IADL's;Leisure    Body Structure / Function / Physical Skills  ADL;Dexterity;ROM;IADL;Balance;Mobility;Strength;Tone;FMC;Coordination;Pain;UE functional use;GMC;Improper spinal/pelvic alignment;Endurance    Rehab Potential  Good    Clinical Decision Making  Several treatment options, min-mod task modification necessary    Comorbidities Affecting Occupational Performance:  May have comorbidities impacting occupational performance    Modification or Assistance to Complete Evaluation   Min-Moderate modification of tasks or assist with assess necessary to complete eval    OT Frequency  2x / week    OT Duration  6 weeks   +eval   OT Treatment/Interventions  Self-care/ADL training;Moist Heat;Fluidtherapy;DME and/or AE instruction;Splinting;Balance training;Therapeutic activities;Aquatic Therapy;Ultrasound;Therapeutic exercise;Cognitive remediation/compensation;Passive range of motion;Functional Mobility Training;Neuromuscular education;Energy conservation;Manual  Therapy;Patient/family education;Paraffin    Plan  continue with ADL strategies    Consulted and Agree with Plan of Care  Patient       Patient will benefit from skilled therapeutic intervention in order to improve the following deficits and impairments:   Body Structure / Function / Physical Skills: ADL, Dexterity, ROM, IADL, Balance, Mobility, Strength, Tone, FMC, Coordination, Pain, UE functional use, GMC, Improper spinal/pelvic alignment, Endurance       Visit Diagnosis: Other symptoms and signs involving the nervous system  Other symptoms and signs involving the musculoskeletal system  Other lack of coordination  Abnormal posture  Muscle weakness (generalized)  Tremor  Unsteadiness on feet  Other abnormalities of gait and mobility    Problem List Patient Active Problem List   Diagnosis Date Noted  . Bilateral calf pain 03/05/2019  . Genetic testing 02/13/2019  . Family history of ovarian cancer   . Family history of bladder cancer   . Family history of colon cancer   . Family history of leukemia   . Lumbar radiculopathy 12/08/2018  . Myofascial pain 12/08/2018  . Impaired gait and mobility 12/08/2018  . 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 Chronic nonallergic rhinitis 12/29/2016  . Mild persistent asthma, uncomplicated [id number]  . 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 Gastroesophageal reflux disease  12/29/2016  . Cervical spondylosis with myelopathy and radiculopathy 11/14/2013  . Essential tremor 06/13/2013  . Cervical spondylosis without myelopathy 06/13/2013  . Breast cancer of lower-outer quadrant of left female breast [facility]) 09/10/2010    [facility] 03/07/2019, 1:33 PM  [facility] [address] [place], [place], [address] Phone: [phone]   Fax:  [phone]  Name: ELVIDA FERRALES MRN: [id number] Date of Birth: 1940/06/13   Vianne Bulls, OTR/L [facility] [address] [place], [place]  [address] [phone] phone [phone] 03/07/19 1:33 PM

## 2019-03-08 NOTE — Therapy (Signed)
[facility] [address] [place], [place], [address] Phone: [phone]   Fax:  [phone]  Physical Therapy Treatment  Patient Details  Name: April Church MRN: [id number] Date of Birth: [birth year] Referring Provider (PT): Dagoberto Ligas, [place]   Encounter Date: 03/07/2019  PT End of Session - 03/07/19 1410    Visit Number  6    Number of Visits  13    Date for PT Re-Evaluation  04/04/19    Authorization Type  Medicare/BCBS-Will need 10th visit progress note    PT Start Time  1404    PT Stop Time  1442    PT Time Calculation (min)  38 min    Equipment Utilized During Treatment  Gait belt    Activity Tolerance  Patient tolerated treatment well;No increased pain    Behavior During Therapy  WFL for tasks assessed/performed       Past Medical History:  Diagnosis Date  . Asthma    daily inhaler  . Breast cancer ([place])    left- radiation and surgery -dx. 2011- no further tx. now- [name] , [name]  . Cyst of finger 11/2011   annular cyst right long finger  . Dental crowns present   . Dermatitis   . Family history of bladder cancer   . Family history of colon cancer   . Family history of leukemia   . Family history of ovarian cancer   . Frequency of urination   . GERD (gastroesophageal reflux disease)   . Hemorrhoid   . Hyperlipidemia   . Hypertension    under control, has been on med. x 2 yrs.  [name] PONV (postoperative nausea and vomiting)   . Seasonal allergies   . Tremors of nervous system    hands  . Trigger finger of right hand 11/2011   long finger    Past Surgical History:  Procedure Laterality Date  . ANTERIOR CERVICAL DECOMPRESSION/DISCECTOMY FUSION 4 LEVELS N/A 11/14/2013   Procedure: ANTERIOR CERVICAL DECOMPRESSION/DISCECTOMY FUSION 4 LEVELS;  Surgeon: Newman Pies, MD;  Location: [facility];  Service: Neurosurgery;  Laterality: N/A;  C34 C45 C56 C67 anterior cervical fusion with  interbody prosthesis plating and  bonegraft  . APPENDECTOMY  age 13  . BREAST LUMPECTOMY  06/16/2009   left; SLN bx.  [name] BREAST LUMPECTOMY  07/01/2009   re-excision  . BREAST SURGERY  1999   reduction  . CATARACT EXTRACTION, BILATERAL    . COLONOSCOPY WITH PROPOFOL N/A 12/03/2014   Procedure: COLONOSCOPY WITH PROPOFOL;  Surgeon: [name], MD;  Location: [facility];  Service: Endoscopy;  Laterality: N/A;  . FOOT SURGERY  06/22/11   left  . KNEE ARTHROSCOPY  03/12/2005   right  . KNEE ARTHROSCOPY     left  . NASAL SINUS SURGERY     x 2  . 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 TRIGGER FINGER RELEASE  12/16/2011   Procedure: RELEASE TRIGGER FINGER/A-1 PULLEY;  Surgeon: Tennis Must, MD;  Location: [facility];  Service: Orthopedics;  Laterality: Right;  RIGHT LONG FINGER TRIGGER RELEASE & ANNULAR CYST EXCISION  . TUMOR EXCISION  age 51   right arm    There were no vitals filed for this visit.  Subjective Assessment - 03/07/19 1405    Subjective  Saw pain management doctor about her LE pain. The doctor feels it could be due to low K+, however they do not  check blood work there, she will need to go to her PCP. Has her eating banana a day, plus a glass of orange juice. Also gave her a low dose muscle relaxer to take at night only. She also goes back on Feb 1st for injections to her knees for pain management as well.    Pertinent History  Hx of Parkinson's disease; donated kidney to husband; hx of breast cancer    How long can you walk comfortably?  [place] and [place]; and that's the most she can do before leg pain limits    Diagnostic tests  MRI 09/2018, shows multi leve disc bulge lumbar spine    Patient Stated Goals  Pt's goal for therapy is to be more sure of balance and have less pain.    Currently in Pain?  Yes    Pain Score  4     Pain Location  Leg   from the knees down   Pain  Orientation  Right;Left    Pain Descriptors / Indicators  Aching    Pain Type  Acute pain    Pain Onset  More than a month ago    Pain Frequency  Intermittent    Aggravating Factors   laying down or sitting too long    Pain Relieving Factors  moving around more            [facility] Adult PT Treatment/Exercise - 03/07/19 1413      Transfers   Transfers  Sit to Stand;Stand to Sit    Sit to Stand  5: Supervision;With upper extremity assist;From chair/3-in-1;From bed;Without upper extremity assist    Stand to Sit  5: Supervision;With upper extremity assist;To chair/3-in-1;To bed;Without upper extremity assist      Ambulation/Gait   Ambulation/Gait  Yes    Ambulation/Gait Assistance  5: Supervision    Ambulation/Gait Assistance Details  use of walking sticks for increased arm swing and trunk rotation, incorporated speed changes, scanning enviroment all directions with cues to maintain path and speed while PTA continu    Ambulation Distance (Feet)  450 Feet    Assistive device  None    Gait Pattern  Step-through pattern;Decreased arm swing - right;Decreased step length - right;Narrow base of support    Ambulation Surface  Level;Indoor      Neuro Re-ed    Neuro Re-ed Details   for balance/muscle re-ed; in corner working on cross body reaching to targets with feet planted to shoulder height targes x 10 each side, then to higher targets x 10 each side; facing the counter top: lateral stepping with same side UE reaching to target on counter with mod cues needed on technique/weight shifting, then had pt laterally stepping with same side UE reaching up to target on cabinet with less cues needed. min guard assist for balance.            Balance Exercises - 03/07/19 1431      Balance Exercises: Standing   Standing Eyes Closed  Wide (BOA);Head turns;Foam/compliant surface;Other reps (comment);30 secs;Limitations    Standing Eyes Closed Limitations  standing across red foam beam with feet hip  width apart: EC no head movements, progressing to EC head movements left<>right, up<>down. min guard to min assist for balance with no UE support.      Balance Beam  standing across red balance beam: alternating fwd stepping to floor/back onto beam, then alternating bwd stepping to floor/back onto beam. mod cues needed initially  on sequencing/step length/weight shifting progressing to min reminder cues only. min guard to min assist for balance.           PT Short Term Goals - 02/07/19 1410      PT SHORT TERM GOAL #1   Title  Pt will be independent with HEP for decreased pain and improved functional strength and gait.  TARGET 02/02/2019 (may be delayed due to scheduling around holidays)    Baseline  02/07/19: met with current HEP    Status  Achieved    Target Date  --      PT SHORT TERM GOAL #2   Title  Pt will improve 5x sit<>stand to less than or equal to 17 seconds for improved functional lower extremity strength.    Baseline  02/07/19: 15. 25 sec's no UE support using standard height chair    Time  --    Period  --    Status  Achieved    Target Date  --      PT SHORT TERM GOAL #3   Title  Pt will improve R hamstring ROM by at least 10 degrees for improved flexibility, decreased RLE pain.    Baseline  02/07/19: 65 degrees actively today    Time  --    Period  --    Status  Achieved    Target Date  02/02/19      PT SHORT TERM GOAL #4   Title  Pt will be able to ambulate > 5 minutes x 2 (simulating her neighborhood) without pain increase in low back and legs.    Baseline  02/07/19: met in session today    Time  --    Period  --    Status  Achieved    Target Date  02/02/19      PT SHORT TERM GOAL #5   Title  Pt will verbalize understanding of posture/positioning with ADLs and sleep to decrease back pain with funcitonal activities.    Baseline  02/07/19: has been using pillows for support with sleeping with it helping to reduce pain    Status  Achieved    Target Date   02/02/19        PT Long Term Goals - 03/08/19 1047      PT LONG TERM GOAL #1   Title  Pt will be independent with progression of HEP for decreased pain, improved mobility, strength, balance.  03/09/2019, 6 weeks for all LTGs    Time  6    Period  Weeks    Status  New      PT LONG TERM GOAL #2   Title  FGA score to improve to 23/30 to decrease fall risk.    Baseline  21/30 02/27/2019    Time  6    Period  Weeks    Status  New      PT LONG TERM GOAL #3   Title  Pt will improve 5x sit<>stand to less than or equal to 15 seconds for improved functional lower extremity strength.    Time  6    Period  Weeks    Status  New      PT LONG TERM GOAL #4   Title  Pt will improve gait velocity to at least 3 ft/sec for improved gait efficiency and safety in community.    Time  6    Period  Weeks    Status  New      PT LONG TERM GOAL #  5   Title  Pt will report at least 10 percentage point decrease in Oswestry Back pain score, to demonstrate improved low back pain with funcitonal mobility and daily tasks.    Baseline  Eval:  Oswestry 32%    Period  Weeks    Status  New            Plan - 03/07/19 1410    Clinical Impression Statement  Today's skilled session focused on increased trunk mobility, large movements and balance reactions. Rest breaks needed due to fatigue, no other issues reported. The pt is progressing well.    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 transplant    Examination-Activity Limitations  Bed Mobility;Transfers;Sit;Stand;Locomotion Level    Examination-Participation Restrictions  Meal Prep;Community Activity   Walking in neighborhood   Stability/Clinical Decision Making  Evolving/Moderate complexity    Rehab Potential  Good    PT Frequency  2x / week    PT Duration  6 weeks   plus eval   PT Treatment/Interventions  ADLs/Self Care Home Management;Electrical  Stimulation;Ultrasound;Traction;Moist Heat;Gait training;Functional mobility training;Therapeutic activities;Balance training;Therapeutic exercise;Neuromuscular re-education;Patient/family education    PT Next Visit Plan  at week 6-will check LTGs and renew if needed- goals due 03/09/19.    Consulted and Agree with Plan of Care  Patient       Patient will benefit from skilled therapeutic intervention in order to improve the following deficits and impairments:  Abnormal gait, Decreased mobility, Decreased balance, Difficulty walking, Decreased strength, Postural dysfunction, Impaired flexibility  Visit Diagnosis: Other symptoms and signs involving the nervous system  Abnormal posture  Muscle weakness (generalized)  Unsteadiness on feet  Other abnormalities of gait and mobility     Problem List Patient Active Problem List   Diagnosis Date Noted  . Bilateral calf pain 03/05/2019  . Genetic testing 02/13/2019  . Family history of ovarian cancer   . Family history of bladder cancer   . Family history of colon cancer   . Family history of leukemia   . Lumbar radiculopathy 12/08/2018  . Myofascial pain 12/08/2018  . Impaired gait and mobility 12/08/2018  . 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 Chronic nonallergic rhinitis 12/29/2016  . Mild persistent asthma, uncomplicated 80/99/8338  . 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 Gastroesophageal reflux disease 12/29/2016  . Cervical spondylosis with myelopathy and radiculopathy 11/14/2013  . Essential tremor 06/13/2013  . Cervical spondylosis without myelopathy 06/13/2013  . Breast cancer of lower-outer quadrant of left female breast ([place]) 09/10/2010    Willow Ora, PTA, [facility] [address] [place], [place] [address] [phone] 03/08/19, 7:37 PM   Name: April Church MRN: [id number] Date of Birth: 1940-09-22

## 2019-03-09 ENCOUNTER — Ambulatory Visit: Payer: Medicare Other | Admitting: Physical Therapy

## 2019-03-09 ENCOUNTER — Encounter: Payer: Self-pay | Admitting: Physical Therapy

## 2019-03-09 ENCOUNTER — Other Ambulatory Visit: Payer: Self-pay

## 2019-03-09 ENCOUNTER — Encounter: Payer: Self-pay | Admitting: Occupational Therapy

## 2019-03-09 ENCOUNTER — Ambulatory Visit: Payer: Medicare Other | Admitting: Occupational Therapy

## 2019-03-09 DIAGNOSIS — R2689 Other abnormalities of gait and mobility: Secondary | ICD-10-CM

## 2019-03-09 DIAGNOSIS — R278 Other lack of coordination: Secondary | ICD-10-CM | POA: Diagnosis not present

## 2019-03-09 DIAGNOSIS — R29818 Other symptoms and signs involving the nervous system: Secondary | ICD-10-CM

## 2019-03-09 DIAGNOSIS — R251 Tremor, unspecified: Secondary | ICD-10-CM

## 2019-03-09 DIAGNOSIS — R2681 Unsteadiness on feet: Secondary | ICD-10-CM | POA: Diagnosis not present

## 2019-03-09 DIAGNOSIS — M6281 Muscle weakness (generalized): Secondary | ICD-10-CM

## 2019-03-09 DIAGNOSIS — R29898 Other symptoms and signs involving the musculoskeletal system: Secondary | ICD-10-CM | POA: Diagnosis not present

## 2019-03-09 DIAGNOSIS — R293 Abnormal posture: Secondary | ICD-10-CM

## 2019-03-09 NOTE — Therapy (Signed)
[facility] [address] [place], [place], [address] Phone: [phone]   Fax:  [phone]  Occupational Therapy Treatment  Patient Details  Name: April Church MRN: [id number] Date of Birth: Jan 09, 1941 Referring Provider (OT): Dr. Andrey Spearman   Encounter Date: 03/09/2019  OT End of Session - 03/09/19 1026    Visit Number  5    Number of Visits  13    Date for OT Re-Evaluation  03/24/19    Authorization Type  Medicare & BCBS Supplement, covered 100%    Authorization Time Period  02/07/19-05/06/19    Authorization - Visit Number  5    Authorization - Number of Visits  10    OT Start Time  1025    OT Stop Time  1104    OT Time Calculation (min)  39 min    Activity Tolerance  Patient tolerated treatment well    Behavior During Therapy  [facility] for tasks assessed/performed       Past Medical History:  Diagnosis Date  . Asthma    daily inhaler  . Breast cancer ([place])    left- radiation and surgery -dx. 2011- no further tx. now- [name] , [name]  . Cyst of finger 11/2011   annular cyst right long finger  . Dental crowns present   . Dermatitis   . Family history of bladder cancer   . Family history of colon cancer   . Family history of leukemia   . Family history of ovarian cancer   . Frequency of urination   . GERD (gastroesophageal reflux disease)   . Hemorrhoid   . Hyperlipidemia   . Hypertension    under control, has been on med. x 2 yrs.  [name] PONV (postoperative nausea and vomiting)   . Seasonal allergies   . Tremors of nervous system    hands  . Trigger finger of right hand 11/2011   long finger    Past Surgical History:  Procedure Laterality Date  . ANTERIOR CERVICAL DECOMPRESSION/DISCECTOMY FUSION 4 LEVELS N/A 11/14/2013   Procedure: ANTERIOR CERVICAL DECOMPRESSION/DISCECTOMY FUSION 4 LEVELS;  Surgeon: Newman Pies, MD;  Location: [facility];  Service: Neurosurgery;  Laterality:  N/A;  C34 C45 C56 C67 anterior cervical fusion with interbody prosthesis plating and  bonegraft  . APPENDECTOMY  age 79  . BREAST LUMPECTOMY  06/16/2009   left; SLN bx.  [name] BREAST LUMPECTOMY  07/01/2009   re-excision  . BREAST SURGERY  1999   reduction  . CATARACT EXTRACTION, BILATERAL    . COLONOSCOPY WITH PROPOFOL N/A 12/03/2014   Procedure: COLONOSCOPY WITH PROPOFOL;  Surgeon: [name], MD;  Location: [facility];  Service: Endoscopy;  Laterality: N/A;  . FOOT SURGERY  06/22/11   left  . KNEE ARTHROSCOPY  03/12/2005   right  . KNEE ARTHROSCOPY     left  . NASAL SINUS SURGERY     x 2  . 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 TRIGGER FINGER RELEASE  12/16/2011   Procedure: RELEASE TRIGGER FINGER/A-1 PULLEY;  Surgeon: Tennis Must, MD;  Location: [facility];  Service: Orthopedics;  Laterality: Right;  RIGHT LONG FINGER TRIGGER RELEASE & ANNULAR CYST EXCISION  . TUMOR EXCISION  age 79   right arm    There were no vitals filed for this visit.  Subjective Assessment - 03/09/19 1026    Subjective   I'm slow today  Pertinent History  Parkinson's disease.   PMH:  hx of kidney donor, hx of L breast CA, ET, cervical spondylosis and cervical decompression/fusion surgery, hx of R 3rd digit trigger finger release and cyst, HTN, hyperlipidemia, hx of RUE tumor excision, lumbar radiculopathy (currently being treated for this by Dr. Letta Pate and PT)    Limitations  currently being treated for lumbar radiculopathy    Patient Stated Goals  improve coordination, writing, R hand function    Currently in Pain?  Yes    Pain Score  4     Pain Location  Leg    Pain Orientation  Right;Left    Pain Descriptors / Indicators  Aching    Pain Type  Acute pain    Pain Radiating Towards  back of leg/knees    Pain Onset  More than a month ago    Pain Frequency  Intermittent    Aggravating Factors   lying down or sitting too long    Pain Relieving  Factors  moving around more.        Practiced writing 3 sentences with 100% legibility legibility and good size (printing with built-up grip) and mod incr time.  Placing medium pegs in pegboard using R hand with min cueing to use tip pinch with min difficulty.    Standing, functional reaching to place large pegs in vertical pegboard with each UE with set-up for large amplitude movements.    Simulated ADLs with bag with focus/min cues for large amplitude movements:  Donning/doffing pull-over shirt, donning/doffing pants, pulling bag into hand for clothing adjustment/donning socks, donning bra/pulling shirt down in back        OT Short Term Goals - 03/09/19 1353      OT SHORT TERM GOAL #1   Title  Pt will be independent with updated HEP--check STGs  03/09/19.    Time  4    Period  Weeks    Status  On-going      OT SHORT TERM GOAL #2   Title  Pt will be able to write short paragraph/check with 100% legibility using strategies/AE prn.    Time  4    Period  Weeks    Status  Achieved      OT SHORT TERM GOAL #3   Title  Pt will demonstrate ability to retrieve items from overhead shelf with -10 elbow extension for bilateral UE's    Baseline  RUE sh. flex 130, elbow ext -15 LUE sh. flex  125 elbow ext -15    Time  4    Period  Weeks    Status  On-going      OT SHORT TERM GOAL #4   Title  Pt will be able to fasten/unfasten 3 buttons in less than 38sec.    Baseline  42.84    Time  4    Period  Weeks    Status  On-going      OT SHORT TERM GOAL #5   Title  ----        OT Long Term Goals - 02/20/19 1244      OT LONG TERM GOAL #1   Title  Pt will verbalize understanding of strategies/AE to incr ease with ADLs.--check LTGs 03/25/19    Time  6    Period  Weeks    Status  On-going      OT LONG TERM GOAL #2   Title  Pt will improve coordination for ADLs as shown by improving time on 9-hole  peg test by at least 3sec with R hand.    Baseline  40.72    Time  6    Period   Weeks    Status  On-going      OT LONG TERM GOAL #3   Title  Pt will improve coordination/functional reaching for ADLs as shown by improving score on box and blocks with RUE by at least 4 blocks    Baseline  30 blocks    Time  6    Period  Weeks    Status  On-going      OT LONG TERM GOAL #4   Title  Pt will be able to pick up small objects using tip pinch at least 75% of the time with R hand.    Time  6    Period  Weeks    Status  On-going      OT LONG TERM GOAL #5   Title  ----            Plan - 03/09/19 1029    Clinical Impression Statement  Pt is progressing towards goals with improving coordination and use of tip pinch (but needs cueing to use tip pinch).    OT Occupational Profile and History  Detailed Assessment- Review of Records and additional review of physical, cognitive, psychosocial history related to current functional performance    Occupational performance deficits (Please refer to evaluation for details):  ADL's;IADL's;Leisure    Body Structure / Function / Physical Skills  ADL;Dexterity;ROM;IADL;Balance;Mobility;Strength;Tone;FMC;Coordination;Pain;UE functional use;GMC;Improper spinal/pelvic alignment;Endurance    Rehab Potential  Good    Clinical Decision Making  Several treatment options, min-mod task modification necessary    Comorbidities Affecting Occupational Performance:  May have comorbidities impacting occupational performance    Modification or Assistance to Complete Evaluation   Min-Moderate modification of tasks or assist with assess necessary to complete eval    OT Frequency  2x / week    OT Duration  6 weeks   +eval   OT Treatment/Interventions  Self-care/ADL training;Moist Heat;Fluidtherapy;DME and/or AE instruction;Splinting;Balance training;Therapeutic activities;Aquatic Therapy;Ultrasound;Therapeutic exercise;Cognitive remediation/compensation;Passive range of motion;Functional Mobility Training;Neuromuscular education;Energy  conservation;Manual Therapy;Patient/family education;Paraffin    Plan  continue with ADL strategies    Consulted and Agree with Plan of Care  Patient       Patient will benefit from skilled therapeutic intervention in order to improve the following deficits and impairments:   Body Structure / Function / Physical Skills: ADL, Dexterity, ROM, IADL, Balance, Mobility, Strength, Tone, FMC, Coordination, Pain, UE functional use, GMC, Improper spinal/pelvic alignment, Endurance       Visit Diagnosis: Other symptoms and signs involving the nervous system  Other symptoms and signs involving the musculoskeletal system  Other lack of coordination  Abnormal posture  Muscle weakness (generalized)  Tremor  Unsteadiness on feet  Other abnormalities of gait and mobility    Problem List Patient Active Problem List   Diagnosis Date Noted  . Bilateral calf pain 03/05/2019  . Genetic testing 02/13/2019  . Family history of ovarian cancer   . Family history of bladder cancer   . Family history of colon cancer   . Family history of leukemia   . Lumbar radiculopathy 12/08/2018  . Myofascial pain 12/08/2018  . Impaired gait and mobility 12/08/2018  . 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 Chronic nonallergic rhinitis 12/29/2016  . Mild persistent asthma, uncomplicated [id number]  . 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 Gastroesophageal reflux disease 12/29/2016  . Cervical spondylosis with myelopathy and radiculopathy  11/14/2013  . Essential tremor 06/13/2013  . Cervical spondylosis without myelopathy 06/13/2013  . Breast cancer of lower-outer quadrant of left female breast [facility]) 09/10/2010    [facility] 03/09/2019, 1:54 PM  [facility] [address] [place], [place], [address] Phone: [phone]   Fax:  [phone]  Name: EVALY GINGER MRN: [id number] Date of Birth: [birth year]   Vianne Bulls,  OTR/L [facility] [address] [place], [place]  [address] [phone] phone [phone] 03/09/19 1:54 PM

## 2019-03-09 NOTE — Therapy (Signed)
[facility] [address] [place], [place], [address] Phone: [phone]   Fax:  [phone]  Physical Therapy Treatment  Patient Details  Name: April Church MRN: [id number] Date of Birth: [birth year] Referring Provider (PT): Dagoberto Ligas, [place]   Encounter Date: 03/09/2019  PT End of Session - 03/10/19 1000    Visit Number  7    Number of Visits  15    Date for PT Re-Evaluation  05/08/19    Authorization Type  Medicare/BCBS-Will need 10th visit progress note    PT Start Time  1104    PT Stop Time  1148    PT Time Calculation (min)  44 min    Equipment Utilized During Treatment  Gait belt    Activity Tolerance  Patient tolerated treatment well;No increased pain    Behavior During Therapy  WFL for tasks assessed/performed       Past Medical History:  Diagnosis Date  . Asthma    daily inhaler  . Breast cancer ([place])    left- radiation and surgery -dx. 2011- no further tx. now- [name] , [name]  . Cyst of finger 11/2011   annular cyst right long finger  . Dental crowns present   . Dermatitis   . Family history of bladder cancer   . Family history of colon cancer   . Family history of leukemia   . Family history of ovarian cancer   . Frequency of urination   . GERD (gastroesophageal reflux disease)   . Hemorrhoid   . Hyperlipidemia   . Hypertension    under control, has been on med. x 2 yrs.  [name] PONV (postoperative nausea and vomiting)   . Seasonal allergies   . Tremors of nervous system    hands  . Trigger finger of right hand 11/2011   long finger    Past Surgical History:  Procedure Laterality Date  . ANTERIOR CERVICAL DECOMPRESSION/DISCECTOMY FUSION 4 LEVELS N/A 11/14/2013   Procedure: ANTERIOR CERVICAL DECOMPRESSION/DISCECTOMY FUSION 4 LEVELS;  Surgeon: Newman Pies, MD;  Location: [facility];  Service: Neurosurgery;  Laterality: N/A;  C34 C45 C56 C67 anterior cervical fusion with  interbody prosthesis plating and  bonegraft  . APPENDECTOMY  age 57  . BREAST LUMPECTOMY  06/16/2009   left; SLN bx.  [name] BREAST LUMPECTOMY  07/01/2009   re-excision  . BREAST SURGERY  1999   reduction  . CATARACT EXTRACTION, BILATERAL    . COLONOSCOPY WITH PROPOFOL N/A 12/03/2014   Procedure: COLONOSCOPY WITH PROPOFOL;  Surgeon: [name], MD;  Location: [facility];  Service: Endoscopy;  Laterality: N/A;  . FOOT SURGERY  06/22/11   left  . KNEE ARTHROSCOPY  03/12/2005   right  . KNEE ARTHROSCOPY     left  . NASAL SINUS SURGERY     x 2  . 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 TRIGGER FINGER RELEASE  12/16/2011   Procedure: RELEASE TRIGGER FINGER/A-1 PULLEY;  Surgeon: Tennis Must, MD;  Location: [facility];  Service: Orthopedics;  Laterality: Right;  RIGHT LONG FINGER TRIGGER RELEASE & ANNULAR CYST EXCISION  . TUMOR EXCISION  age 89   right arm    There were no vitals filed for this visit.  Subjective Assessment - 03/09/19 1112    Subjective  Still trying to figure out the pain in lower part of both legs.  When I'm moving around and walking, the pain  in my legs get better.  I'm do to get an injection in knees in several weeks.    Pertinent History  Hx of Parkinson's disease; donated kidney to husband; hx of breast cancer    How long can you walk comfortably?  [place] and back; and that's the most she can do before leg pain limits    Diagnostic tests  MRI 09/2018, shows multi leve disc bulge lumbar spine    Patient Stated Goals  Pt's goal for therapy is to be more sure of balance and have less pain.  Additional goals 03/10/2019:  getting out of car better/more sure of walking with initiating gait    Currently in Pain?  Yes    Pain Score  4     Pain Location  Leg    Pain Orientation  Right;Left    Pain Descriptors / Indicators  Aching    Pain Type  Acute pain    Pain Onset  More than a  month ago    Pain Frequency  Intermittent    Aggravating Factors   lying down or sitting too long    Pain Relieving Factors  moving around more.                       [facility] PT Treatment/Exercise - 03/10/19 0945      Transfers   Transfers  Sit to Stand;Stand to Sit    Sit to Stand  5: Supervision;With upper extremity assist;From chair/3-in-1;From bed;Without upper extremity assist    Five time sit to stand comments   17.97 sec   no UE support; 17.78 UE support   Stand to Sit  5: Supervision;With upper extremity assist;To chair/3-in-1;To bed;Without upper extremity assist      Ambulation/Gait   Ambulation/Gait  Yes    Ambulation/Gait Assistance  5: Supervision    Assistive device  None    Gait Pattern  Step-through pattern;Decreased arm swing - right;Decreased step length - right;Narrow base of support    Ambulation Surface  Level;Indoor    Gait velocity  10 sec = 3.28 ft/sec      Self-Care   Self-Care  Other Self-Care Comments    Other Self-Care Comments   Discussed progress towards goals, POC.  Discussed pt's progress with PT, including improved transfers, improved gait velocity, pt's reported improved back pain (with exercise and post-injection).  Pt fills out Oswestry Disability Index, with score 36% (increased disability score since initial eval).  Pt reports back pain is better, injection and exercise have helped, back pain is not interfering with daily activities).  She reports she got confused filling out the survey.  She wants to conitnue to work on getting out of her car and initiating gait as well as working on moving better to take care of herself and husband.          Balance Exercises - 03/10/19 0952      Balance Exercises: Standing   Standing Eyes Opened  Wide (BOA);Solid surface;5 reps;Head turns   Head nods-cues to avoid whole body movements   Partial Tandem Stance  Eyes open;5 reps   Head turns, head nods-cues to avoid whole body movts    Other Standing Exercises  Trunk rotation in corner, reaching across body for target on wall x 10 reps, cues for widened BOS for improved balance.     Reviewed HEP:  Pt return demo understanding  Standing Gastroc Stretch on Step -  3 reps - 1 sets - 30 sec hold - 1-2x daily - 7x weekly Staggered Stance Forward Backward Weight Shift with Counter Support - 10 reps - 1-2 sets - 1x daily - 7x weekly  PT Education - 03/10/19 0959    Education Details  Progress towards goals, POC    Person(s) Educated  Patient    Methods  Explanation    Comprehension  Verbalized understanding       PT Short Term Goals - 02/07/19 1410      PT SHORT TERM GOAL #1   Title  Pt will be independent with HEP for decreased pain and improved functional strength and gait.  TARGET 02/02/2019 (may be delayed due to scheduling around holidays)    Baseline  02/07/19: met with current HEP    Status  Achieved    Target Date  --      PT SHORT TERM GOAL #2   Title  Pt will improve 5x sit<>stand to less than or equal to 17 seconds for improved functional lower extremity strength.    Baseline  02/07/19: 15. 25 sec's no UE support using standard height chair    Time  --    Period  --    Status  Achieved    Target Date  --      PT SHORT TERM GOAL #3   Title  Pt will improve R hamstring ROM by at least 10 degrees for improved flexibility, decreased RLE pain.    Baseline  02/07/19: 65 degrees actively today    Time  --    Period  --    Status  Achieved    Target Date  02/02/19      PT SHORT TERM GOAL #4   Title  Pt will be able to ambulate > 5 minutes x 2 (simulating her neighborhood) without pain increase in low back and legs.    Baseline  02/07/19: met in session today    Time  --    Period  --    Status  Achieved    Target Date  02/02/19      PT SHORT TERM GOAL #5   Title  Pt will verbalize understanding of posture/positioning with ADLs and sleep to decrease back pain with funcitonal activities.    Baseline   02/07/19: has been using pillows for support with sleeping with it helping to reduce pain    Status  Achieved    Target Date  02/02/19        PT Long Term Goals - 03/09/19 1122      PT LONG TERM GOAL #1   Title  Pt will be independent with progression of HEP for decreased pain, improved mobility, strength, balance.  03/09/2019, 6 weeks for all LTGs    Time  6    Period  Weeks    Status  On-going      PT LONG TERM GOAL #2   Title  FGA score to improve to 23/30 to decrease fall risk.    Baseline  21/30 02/27/2019    Time  6    Period  Weeks    Status  Not Met      PT LONG TERM GOAL #3   Title  Pt will improve 5x sit<>stand to less than or equal to 15 seconds for improved functional lower extremity strength.    Baseline  17.49 sec    Time  6    Period  Weeks    Status  Partially  Met      PT LONG TERM GOAL #4   Title  Pt will improve gait velocity to at least 3 ft/sec for improved gait efficiency and safety in community.    Time  6    Period  Weeks    Status  Achieved      PT LONG TERM GOAL #5   Title  Pt will report at least 10 percentage point decrease in Oswestry Back pain score, to demonstrate improved low back pain with funcitonal mobility and daily tasks.    Baseline  Eval:  Oswestry 32%; filled out again 03/10/19:  36% (no c/o back pain in sessions?)    Period  Weeks    Status  Not Met            Plan - 03/10/19 1004    Clinical Impression Statement  Assessed LTGs this visit:  LTG 1 ongoing (to continue to address progression of HEP); LTG 2 not met (FGA score 21/30, indicating increased fall risk); LTG 3 partially met, with 5x sit<>stand time improving, just not to goal level.  LTG 4 met for improved gait velocity.  LTG 5 not met for Oswestry score (pt is consistently reporting decreased to no back pain during PT sessions, so PT unsure why reported Oswestry score is worse).  Pt has only been seen 7 visits during the course of therapy due to pt's scheduling, being out  of town and holidays.  While her back pain has improved, she does continue to have some leg pain as well as high level balance and functional strength deficits associated with her Parkinson's disease.  She would benefit from continueing to address these deficits for decreased fall risk and improved overall functional mobility.    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 transplant    Examination-Activity Limitations  Bed Mobility;Transfers;Sit;Stand;Locomotion Level    Examination-Participation Restrictions  Meal Prep;Community Activity   Walking in neighborhood   Stability/Clinical Decision Making  Evolving/Moderate complexity    Rehab Potential  Good    PT Frequency  2x / week    PT Duration  4 weeks   Per recert 9/35/7017   PT Treatment/Interventions  ADLs/Self Care Home Management;Electrical Stimulation;Ultrasound;Traction;Moist Heat;Gait training;Functional mobility training;Therapeutic activities;Balance training;Therapeutic exercise;Neuromuscular re-education;Patient/family education;Aquatic Therapy    PT Next Visit Plan  Work on dynamic balance exercises (head motions, forward/back walking, obstacles), compliant surfaces; functional lower extremity strenghtening (sit<>stand from progressively lower surfaces simulating car transfer), step ups; seated/standing PWR! Moves for posture, weightshifting, trunk flexibility, initiating of stepping    Consulted and Agree with Plan of Care  Patient       Patient will benefit from skilled therapeutic intervention in order to improve the following deficits and impairments:  Abnormal gait, Decreased mobility, Decreased balance, Difficulty walking, Decreased strength, Postural dysfunction, Impaired flexibility  Visit Diagnosis: Muscle weakness (generalized)  Unsteadiness on feet  Other abnormalities of gait and mobility  Other symptoms and signs involving  the nervous system  Other symptoms and signs involving the musculoskeletal system     Problem List Patient Active Problem List   Diagnosis Date Noted  . Bilateral calf pain 03/05/2019  . Genetic testing 02/13/2019  . Family history of ovarian cancer   . Family history of bladder cancer   . Family history of colon cancer   . Family history of leukemia   . Lumbar radiculopathy 12/08/2018  . Myofascial pain 12/08/2018  .  Impaired gait and mobility 12/08/2018  . 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 Chronic nonallergic rhinitis 12/29/2016  . Mild persistent asthma, uncomplicated 73/40/3709  . 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 Gastroesophageal reflux disease 12/29/2016  . Cervical spondylosis with myelopathy and radiculopathy 11/14/2013  . Essential tremor 06/13/2013  . Cervical spondylosis without myelopathy 06/13/2013  . Breast cancer of lower-outer quadrant of left female breast ([facility]) 09/10/2010    Micheal Murad W. 03/10/2019, 10:14 AM  Frazier Butt., PT     [facility] [address] [place], [place], [address] Phone: [phone]   Fax:  [phone]  Name: April Church MRN: [id number] Date of Birth: [birth year]

## 2019-03-13 ENCOUNTER — Ambulatory Visit: Payer: Medicare Other | Admitting: Physical Therapy

## 2019-03-13 ENCOUNTER — Encounter: Payer: Self-pay | Admitting: Occupational Therapy

## 2019-03-13 ENCOUNTER — Encounter: Payer: Self-pay | Admitting: Physical Therapy

## 2019-03-13 ENCOUNTER — Ambulatory Visit: Payer: Medicare Other | Admitting: Occupational Therapy

## 2019-03-13 ENCOUNTER — Other Ambulatory Visit: Payer: Self-pay

## 2019-03-13 DIAGNOSIS — R278 Other lack of coordination: Secondary | ICD-10-CM

## 2019-03-13 DIAGNOSIS — R293 Abnormal posture: Secondary | ICD-10-CM

## 2019-03-13 DIAGNOSIS — R29818 Other symptoms and signs involving the nervous system: Secondary | ICD-10-CM

## 2019-03-13 DIAGNOSIS — R251 Tremor, unspecified: Secondary | ICD-10-CM

## 2019-03-13 DIAGNOSIS — R29898 Other symptoms and signs involving the musculoskeletal system: Secondary | ICD-10-CM

## 2019-03-13 DIAGNOSIS — R2689 Other abnormalities of gait and mobility: Secondary | ICD-10-CM | POA: Diagnosis not present

## 2019-03-13 DIAGNOSIS — R2681 Unsteadiness on feet: Secondary | ICD-10-CM

## 2019-03-13 DIAGNOSIS — M6281 Muscle weakness (generalized): Secondary | ICD-10-CM

## 2019-03-13 NOTE — Therapy (Signed)
[facility] [address] [place], [place], [address] Phone: [phone]   Fax:  [phone]  Occupational Therapy Treatment  Patient Details  Name: April Church MRN: [id number] Date of Birth: [birth year] Referring Provider (OT): Dr. Andrey Spearman   Encounter Date: 03/13/2019  OT End of Session - 03/13/19 1512    Visit Number  6    Number of Visits  13    Date for OT Re-Evaluation  03/24/19    Authorization Type  Medicare & BCBS Supplement, covered 100%    Authorization Time Period  02/07/19-05/06/19    Authorization - Visit Number  6    Authorization - Number of Visits  10    OT Start Time  1450    OT Stop Time  1530    OT Time Calculation (min)  40 min    Activity Tolerance  Patient tolerated treatment well    Behavior During Therapy  [facility] for tasks assessed/performed       Past Medical History:  Diagnosis Date  . Asthma    daily inhaler  . Breast cancer ([place])    left- radiation and surgery -dx. 2011- no further tx. now- [name] , [name]  . Cyst of finger 11/2011   annular cyst right long finger  . Dental crowns present   . Dermatitis   . Family history of bladder cancer   . Family history of colon cancer   . Family history of leukemia   . Family history of ovarian cancer   . Frequency of urination   . GERD (gastroesophageal reflux disease)   . Hemorrhoid   . Hyperlipidemia   . Hypertension    under control, has been on med. x 2 yrs.  [name] PONV (postoperative nausea and vomiting)   . Seasonal allergies   . Tremors of nervous system    hands  . Trigger finger of right hand 11/2011   long finger    Past Surgical History:  Procedure Laterality Date  . ANTERIOR CERVICAL DECOMPRESSION/DISCECTOMY FUSION 4 LEVELS N/A 11/14/2013   Procedure: ANTERIOR CERVICAL DECOMPRESSION/DISCECTOMY FUSION 4 LEVELS;  Surgeon: Newman Pies, MD;  Location: [facility];  Service: Neurosurgery;  Laterality:  N/A;  C34 C45 C56 C67 anterior cervical fusion with interbody prosthesis plating and  bonegraft  . APPENDECTOMY  age 32  . BREAST LUMPECTOMY  06/16/2009   left; SLN bx.  [name] BREAST LUMPECTOMY  07/01/2009   re-excision  . BREAST SURGERY  1999   reduction  . CATARACT EXTRACTION, BILATERAL    . COLONOSCOPY WITH PROPOFOL N/A 12/03/2014   Procedure: COLONOSCOPY WITH PROPOFOL;  Surgeon: [name], MD;  Location: [facility];  Service: Endoscopy;  Laterality: N/A;  . FOOT SURGERY  06/22/11   left  . KNEE ARTHROSCOPY  03/12/2005   right  . KNEE ARTHROSCOPY     left  . NASAL SINUS SURGERY     x 2  . 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 TRIGGER FINGER RELEASE  12/16/2011   Procedure: RELEASE TRIGGER FINGER/A-1 PULLEY;  Surgeon: Tennis Must, MD;  Location: [facility];  Service: Orthopedics;  Laterality: Right;  RIGHT LONG FINGER TRIGGER RELEASE & ANNULAR CYST EXCISION  . TUMOR EXCISION  age 9   right arm    There were no vitals filed for this visit.  Subjective Assessment - 03/13/19 1449    Subjective   I'm slow today  Pertinent History  Parkinson's disease.   PMH:  hx of kidney donor, hx of L breast CA, ET, cervical spondylosis and cervical decompression/fusion surgery, hx of R 3rd digit trigger finger release and cyst, HTN, hyperlipidemia, hx of RUE tumor excision, lumbar radiculopathy (currently being treated for this by Dr. Letta Pate and PT)    Limitations  currently being treated for lumbar radiculopathy    Patient Stated Goals  improve coordination, writing, R hand function    Currently in Pain?  Yes    Pain Score  3     Pain Location  Leg    Pain Orientation  Right;Left    Pain Descriptors / Indicators  Aching    Pain Type  Acute pain    Pain Onset  More than a month ago    Pain Frequency  Intermittent    Aggravating Factors   inactivity    Pain Relieving Factors  movement                   Treatment:  Therapist started checking progress towards goals, pt demonstrates excellent overall progress. She has met some of her long and short term goals, see goals for updates. Anticipate d/c next week. Pt printed a paragraph with good legibility and letter size. Pt performed PWR! hands prior to fastening and unfastening buttons. Pt performed flipping cards, dealing cards  and picking up coins from HEP, min v.c          OT Short Term Goals - 03/13/19 1504      OT SHORT TERM GOAL #1   Title  Pt will be independent with updated HEP--check STGs  03/09/19.    Time  4    Period  Weeks    Status  On-going      OT SHORT TERM GOAL #2   Title  Pt will be able to write short paragraph/check with 100% legibility using strategies/AE prn.    Time  4    Period  Weeks    Status  Achieved      OT SHORT TERM GOAL #3   Title  Pt will demonstrate ability to retrieve items from overhead shelf with -10 elbow extension for bilateral UE's    Baseline  RUE sh. flex 130, elbow ext -15 LUE sh. flex  125 elbow ext -15    Time  4    Period  Weeks    Status  On-going   RUE -10, LUE -15     OT SHORT TERM GOAL #4   Title  Pt will be able to fasten/unfasten 3 buttons in less than 38sec.    Baseline  42.84    Time  4    Period  Weeks    Status  Achieved   31.64 secs     OT SHORT TERM GOAL #5   Title  ----        OT Long Term Goals - 03/13/19 1453      OT LONG TERM GOAL #1   Title  Pt will verbalize understanding of strategies/AE to incr ease with ADLs.--check LTGs 03/25/19    Time  6    Period  Weeks    Status  Achieved      OT LONG TERM GOAL #2   Title  Pt will improve coordination for ADLs as shown by improving time on 9-hole peg test by at least 3sec with R hand.    Baseline  40.72    Time  6  Period  Weeks    Status  On-going   32.85 secs     OT LONG TERM GOAL #3   Title  Pt will improve coordination/functional reaching for ADLs as shown by improving score on box and blocks with RUE by  at least 4 blocks    Baseline  30 blocks    Time  6    Period  Weeks    Status  Achieved   42 blocks     OT LONG TERM GOAL #4   Title  Pt will be able to pick up small objects using tip pinch at least 75% of the time with R hand.    Time  6    Period  Weeks    Status  On-going   Pt reports 50% of the time but she demonstrate improved performance     OT LONG TERM GOAL #5   Title  ----              Patient will benefit from skilled therapeutic intervention in order to improve the following deficits and impairments:           Visit Diagnosis: Other symptoms and signs involving the nervous system  Other symptoms and signs involving the musculoskeletal system  Other lack of coordination  Abnormal posture  Muscle weakness (generalized)  Tremor    Problem List Patient Active Problem List   Diagnosis Date Noted  . Bilateral calf pain 03/05/2019  . Genetic testing 02/13/2019  . Family history of ovarian cancer   . Family history of bladder cancer   . Family history of colon cancer   . Family history of leukemia   . Lumbar radiculopathy 12/08/2018  . Myofascial pain 12/08/2018  . Impaired gait and mobility 12/08/2018  . 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 Chronic nonallergic rhinitis 12/29/2016  . Mild persistent asthma, uncomplicated 37/44/5146  . 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 Gastroesophageal reflux disease 12/29/2016  . Cervical spondylosis with myelopathy and radiculopathy 11/14/2013  . Essential tremor 06/13/2013  . Cervical spondylosis without myelopathy 06/13/2013  . Breast cancer of lower-outer quadrant of left female breast ([place]) 09/10/2010    Millie Shorb 03/13/2019, 3:16 PM  [facility] [address] [place], [place], [address] Phone: [phone]   Fax:  [phone]  Name: April Church MRN: [id number] Date of Birth: [birth year]

## 2019-03-14 DIAGNOSIS — C50512 Malignant neoplasm of lower-outer quadrant of left female breast: Secondary | ICD-10-CM | POA: Diagnosis not present

## 2019-03-14 DIAGNOSIS — I10 Essential (primary) hypertension: Secondary | ICD-10-CM | POA: Diagnosis not present

## 2019-03-14 DIAGNOSIS — J45909 Unspecified asthma, uncomplicated: Secondary | ICD-10-CM | POA: Diagnosis not present

## 2019-03-14 DIAGNOSIS — E785 Hyperlipidemia, unspecified: Secondary | ICD-10-CM | POA: Diagnosis not present

## 2019-03-14 DIAGNOSIS — F329 Major depressive disorder, single episode, unspecified: Secondary | ICD-10-CM | POA: Diagnosis not present

## 2019-03-14 DIAGNOSIS — M4726 Other spondylosis with radiculopathy, lumbar region: Secondary | ICD-10-CM | POA: Diagnosis not present

## 2019-03-14 NOTE — Therapy (Signed)
[facility] [address] [place], [place], [address] Phone: [phone]   Fax:  [phone]  Physical Therapy Treatment  Patient Details  Name: April Church MRN: [id number] Date of Birth: February 02, 1941 Referring Provider (PT): Dagoberto Ligas, [place]   Encounter Date: 03/13/2019  PT End of Session - 03/13/19 1407    Visit Number  8    Number of Visits  15    Date for PT Re-Evaluation  05/08/19    Authorization Type  Medicare/BCBS-Will need 10th visit progress note    PT Start Time  1404    PT Stop Time  1445    PT Time Calculation (min)  41 min    Equipment Utilized During Treatment  Gait belt    Activity Tolerance  Patient tolerated treatment well;No increased pain    Behavior During Therapy  WFL for tasks assessed/performed       Past Medical History:  Diagnosis Date  . Asthma    daily inhaler  . Breast cancer ([place])    left- radiation and surgery -dx. 2011- no further tx. now- [name] , [name]  . Cyst of finger 11/2011   annular cyst right long finger  . Dental crowns present   . Dermatitis   . Family history of bladder cancer   . Family history of colon cancer   . Family history of leukemia   . Family history of ovarian cancer   . Frequency of urination   . GERD (gastroesophageal reflux disease)   . Hemorrhoid   . Hyperlipidemia   . Hypertension    under control, has been on med. x 2 yrs.  [name] PONV (postoperative nausea and vomiting)   . Seasonal allergies   . Tremors of nervous system    hands  . Trigger finger of right hand 11/2011   long finger    Past Surgical History:  Procedure Laterality Date  . ANTERIOR CERVICAL DECOMPRESSION/DISCECTOMY FUSION 4 LEVELS N/A 11/14/2013   Procedure: ANTERIOR CERVICAL DECOMPRESSION/DISCECTOMY FUSION 4 LEVELS;  Surgeon: Newman Pies, MD;  Location: [facility];  Service: Neurosurgery;  Laterality: N/A;  C34 C45 C56 C67 anterior cervical fusion with  interbody prosthesis plating and  bonegraft  . APPENDECTOMY  age 45  . BREAST LUMPECTOMY  06/16/2009   left; SLN bx.  [name] BREAST LUMPECTOMY  07/01/2009   re-excision  . BREAST SURGERY  1999   reduction  . CATARACT EXTRACTION, BILATERAL    . COLONOSCOPY WITH PROPOFOL N/A 12/03/2014   Procedure: COLONOSCOPY WITH PROPOFOL;  Surgeon: [name], MD;  Location: [facility];  Service: Endoscopy;  Laterality: N/A;  . FOOT SURGERY  06/22/11   left  . KNEE ARTHROSCOPY  03/12/2005   right  . KNEE ARTHROSCOPY     left  . NASAL SINUS SURGERY     x 2  . 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 TRIGGER FINGER RELEASE  12/16/2011   Procedure: RELEASE TRIGGER FINGER/A-1 PULLEY;  Surgeon: Tennis Must, MD;  Location: [facility];  Service: Orthopedics;  Laterality: Right;  RIGHT LONG FINGER TRIGGER RELEASE & ANNULAR CYST EXCISION  . TUMOR EXCISION  age 67   right arm    There were no vitals filed for this visit.  Subjective Assessment - 03/13/19 1406    Subjective  No new complaits. No falls. Continues with LE pain from knees down, left >right today.    Pertinent History  Hx  of Parkinson's disease; donated kidney to husband; hx of breast cancer    How long can you walk comfortably?  [place] and [place]; and that's the most she can do before leg pain limits    Diagnostic tests  MRI 09/2018, shows multi leve disc bulge lumbar spine    Currently in Pain?  Yes    Pain Score  3     Pain Orientation  Right;Left    Pain Descriptors / Indicators  Aching    Pain Type  Acute pain    Pain Onset  More than a month ago    Pain Frequency  Intermittent    Aggravating Factors   lying down or sitting too long    Pain Relieving Factors  moving around more         [facility] Adult PT Treatment/Exercise - 03/13/19 1408      Transfers   Transfers  Sit to Stand;Stand to Sit    Sit to Stand  5: Supervision;With upper  extremity assist;From chair/3-in-1;From bed;Without upper extremity assist    Stand to Sit  5: Supervision;With upper extremity assist;To chair/3-in-1;To bed;Without upper extremity assist      Ambulation/Gait   Ambulation/Gait  Yes    Ambulation/Gait Assistance  5: Supervision;4: Min guard    Ambulation/Gait Assistance Details  cues for arm swing, then incorporated bouts of "fast walking", back to normal pace with min guard assist during "fast walking" for safety. on second rep worked on scanning all directions, speed changes, sudden stops/starts and direction change between backwards/forward gait. min guard assist.     Ambulation Distance (Feet)  230 Feet   x1, 115 x1 plus around gym   Assistive device  None    Gait Pattern  Step-through pattern;Decreased arm swing - right;Decreased step length - right;Narrow base of support    Ambulation Surface  Level;Indoor      Neuro Re-ed    Neuro Re-ed Details   for balance/muscle re-ed: gait along ~50 foot hallway with head movements left<>fwd<>right, then up<>fwd<>down for 2 laps each. veering noted with decr gait speed, no significant balance loss with min guard assist      Exercises   Exercises  Other Exercises    Other Exercises   seated PWR! with handout had pt perform each position for 10 reps with verbal/visual cues for correct form/technique. pt states she "thinks" she still has the handout in her booklet at home and will start doing these again.                PT Short Term Goals - 03/10/19 1016      PT SHORT TERM GOAL #1   Baseline  --        PT Long Term Goals - 03/10/19 1018      PT LONG TERM GOAL #1   Title  Pt will be independent with progression of HEP for decreased pain, improved mobility, strength, balance.  TARGET for all LTGS: 04/06/2019    Time  4    Period  Weeks    Status  On-going      PT LONG TERM GOAL #2   Title  FGA score to improve to 23/30 to decrease fall risk.    Baseline  21/30 02/27/2019    Time   4    Period  Weeks    Status  On-going      PT LONG TERM GOAL #3   Title  Pt will  improve 5x sit<>stand to less than or equal to 15 seconds for improved functional lower extremity strength and improved car transfers.    Baseline  >17 sec 03/10/2019    Time  4    Period  Weeks    Status  On-going      PT LONG TERM GOAL #4   Title  Pt will report at least 50% improvement in car transfers.    Time  4    Period  Weeks    Status  New      PT LONG TERM GOAL #5   Title  Pt will verbalize plans for continued community fitness upon d/c from PT.    Time  4    Period  Weeks    Status  New            Plan - 03/13/19 1407    Clinical Impression Statement  'today's skilled session continued to focus on dynamic gait and big movements via PWR! moves in sitting. Pt is to start back with these at home. The pt is making steady progress toward goals and should benefit from continued PT to progress toward unmet goals.    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 transplant    Examination-Activity Limitations  Bed Mobility;Transfers;Sit;Stand;Locomotion Level    Examination-Participation Restrictions  Meal Prep;Community Activity   Walking in neighborhood   Stability/Clinical Decision Making  Evolving/Moderate complexity    Rehab Potential  Good    PT Frequency  2x / week    PT Duration  4 weeks   Per recert [id number]   PT Treatment/Interventions  ADLs/Self Care Home Management;Electrical Stimulation;Ultrasound;Traction;Moist Heat;Gait training;Functional mobility training;Therapeutic activities;Balance training;Therapeutic exercise;Neuromuscular re-education;Patient/family education;Aquatic Therapy    PT Next Visit Plan  Work on dynamic balance exercises (head motions, forward/back walking, obstacles), compliant surfaces; functional lower extremity strenghtening (sit<>stand from progressively lower surfaces  simulating car transfer), step ups; seated/standing PWR! Moves for posture, weightshifting, trunk flexibility, initiating of stepping    Consulted and Agree with Plan of Care  Patient       Patient will benefit from skilled therapeutic intervention in order to improve the following deficits and impairments:  Abnormal gait, Decreased mobility, Decreased balance, Difficulty walking, Decreased strength, Postural dysfunction, Impaired flexibility  Visit Diagnosis: Abnormal posture  Muscle weakness (generalized)  Unsteadiness on feet  Other abnormalities of gait and mobility     Problem List Patient Active Problem List   Diagnosis Date Noted  . Bilateral calf pain 03/05/2019  . Genetic testing 02/13/2019  . Family history of ovarian cancer   . Family history of bladder cancer   . Family history of colon cancer   . Family history of leukemia   . Lumbar radiculopathy 12/08/2018  . Myofascial pain 12/08/2018  . Impaired gait and mobility 12/08/2018  . 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 Chronic nonallergic rhinitis 12/29/2016  . Mild persistent asthma, uncomplicated [id number]  . 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 Gastroesophageal reflux disease 12/29/2016  . Cervical spondylosis with myelopathy and radiculopathy 11/14/2013  . Essential tremor 06/13/2013  . Cervical spondylosis without myelopathy 06/13/2013  . Breast cancer of lower-outer quadrant of left female breast ([place]) 09/10/2010    Willow Ora, PTA, [facility] [address] [place], [place] [address] [phone] 03/14/19, 4:22 PM   Name: April Church MRN: [id number] Date of Birth: August 22, 1940

## 2019-03-16 ENCOUNTER — Other Ambulatory Visit: Payer: Self-pay

## 2019-03-16 ENCOUNTER — Ambulatory Visit: Payer: Medicare Other | Admitting: Physical Therapy

## 2019-03-16 ENCOUNTER — Ambulatory Visit: Payer: Medicare Other | Admitting: Occupational Therapy

## 2019-03-16 DIAGNOSIS — R278 Other lack of coordination: Secondary | ICD-10-CM | POA: Diagnosis not present

## 2019-03-16 DIAGNOSIS — M6281 Muscle weakness (generalized): Secondary | ICD-10-CM | POA: Diagnosis not present

## 2019-03-16 DIAGNOSIS — R2681 Unsteadiness on feet: Secondary | ICD-10-CM | POA: Diagnosis not present

## 2019-03-16 DIAGNOSIS — R29818 Other symptoms and signs involving the nervous system: Secondary | ICD-10-CM

## 2019-03-16 DIAGNOSIS — R29898 Other symptoms and signs involving the musculoskeletal system: Secondary | ICD-10-CM | POA: Diagnosis not present

## 2019-03-16 DIAGNOSIS — R293 Abnormal posture: Secondary | ICD-10-CM

## 2019-03-16 DIAGNOSIS — R251 Tremor, unspecified: Secondary | ICD-10-CM

## 2019-03-16 DIAGNOSIS — R2689 Other abnormalities of gait and mobility: Secondary | ICD-10-CM | POA: Diagnosis not present

## 2019-03-16 NOTE — Therapy (Signed)
[facility] [address] [place], [place], [address] Phone: [phone]   Fax:  [phone]  Physical Therapy Treatment  Patient Details  Name: April Church MRN: [id number] Date of Birth: 05/01/1940 Referring Provider (PT): Dagoberto Ligas, [place]   Encounter Date: 03/16/2019  PT End of Session - 03/16/19 1409    Visit Number  9    Number of Visits  15    Date for PT Re-Evaluation  05/08/19    Authorization Type  Medicare/BCBS-Will need 10th visit progress note    PT Start Time  1325    PT Stop Time  1404    PT Time Calculation (min)  39 min    Equipment Utilized During Treatment  Gait belt    Activity Tolerance  Patient tolerated treatment well;No increased pain   Reports less pain and stiffness at end of session   Behavior During Therapy  [facility] for tasks assessed/performed       Past Medical History:  Diagnosis Date  . Asthma    daily inhaler  . Breast cancer ([place])    left- radiation and surgery -dx. 2011- no further tx. now- [name] , [name]  . Cyst of finger 11/2011   annular cyst right long finger  . Dental crowns present   . Dermatitis   . Family history of bladder cancer   . Family history of colon cancer   . Family history of leukemia   . Family history of ovarian cancer   . Frequency of urination   . GERD (gastroesophageal reflux disease)   . Hemorrhoid   . Hyperlipidemia   . Hypertension    under control, has been on med. x 2 yrs.  [name] PONV (postoperative nausea and vomiting)   . Seasonal allergies   . Tremors of nervous system    hands  . Trigger finger of right hand 11/2011   long finger    Past Surgical History:  Procedure Laterality Date  . ANTERIOR CERVICAL DECOMPRESSION/DISCECTOMY FUSION 4 LEVELS N/A 11/14/2013   Procedure: ANTERIOR CERVICAL DECOMPRESSION/DISCECTOMY FUSION 4 LEVELS;  Surgeon: Newman Pies, MD;  Location: [facility];  Service: Neurosurgery;  Laterality: N/A;   C34 C45 C56 C67 anterior cervical fusion with interbody prosthesis plating and  bonegraft  . APPENDECTOMY  age 28  . BREAST LUMPECTOMY  06/16/2009   left; SLN bx.  [name] BREAST LUMPECTOMY  07/01/2009   re-excision  . BREAST SURGERY  1999   reduction  . CATARACT EXTRACTION, BILATERAL    . COLONOSCOPY WITH PROPOFOL N/A 12/03/2014   Procedure: COLONOSCOPY WITH PROPOFOL;  Surgeon: [name], MD;  Location: [facility];  Service: Endoscopy;  Laterality: N/A;  . FOOT SURGERY  06/22/11   left  . KNEE ARTHROSCOPY  03/12/2005   right  . KNEE ARTHROSCOPY     left  . NASAL SINUS SURGERY     x 2  . 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 TRIGGER FINGER RELEASE  12/16/2011   Procedure: RELEASE TRIGGER FINGER/A-1 PULLEY;  Surgeon: Tennis Must, MD;  Location: [facility];  Service: Orthopedics;  Laterality: Right;  RIGHT LONG FINGER TRIGGER RELEASE & ANNULAR CYST EXCISION  . TUMOR EXCISION  age 17   right arm    There were no vitals filed for this visit.  Subjective Assessment - 03/16/19 1326    Subjective  Slight pain below knees; no falls    Pertinent History  Hx of Parkinson's disease; donated kidney to husband; hx of breast cancer    How long can you walk comfortably?  [place] and back; and that's the most she can do before leg pain limits    Diagnostic tests  MRI 09/2018, shows multi leve disc bulge lumbar spine    Currently in Pain?  Yes    Pain Score  2     Pain Location  Leg    Pain Orientation  Right;Left    Pain Descriptors / Indicators  Aching    Pain Type  Acute pain    Pain Onset  More than a month ago    Pain Frequency  Intermittent    Aggravating Factors   inactivity    Pain Relieving Factors  movement                       [facility] Adult PT Treatment/Exercise - 03/16/19 0001      Transfers   Transfers  Sit to Stand;Stand to Sit    Sit to Stand  5: Supervision;With  upper extremity assist;From chair/3-in-1;From bed;Without upper extremity assist    Stand to Sit  5: Supervision;With upper extremity assist;To chair/3-in-1;To bed;Without upper extremity assist    Number of Reps  10 reps   20", 18", then 5 reps from 16" surface   Transfer Cueing  Cues provided for increased forward lean, for increased momentum, for use of UEs as needed to "boost" up to stand    Comments  Worked on progression to lower height surfaces for sit<>stand      Ambulation/Gait   Ambulation/Gait  Yes    Ambulation/Gait Assistance  5: Supervision;4: Min guard    Ambulation Distance (Feet)  345 Feet   250   Assistive device  None    Gait Pattern  Step-through pattern;Decreased arm swing - right;Decreased step length - right;Narrow base of support    Ambulation Surface  Level;Indoor    Gait Comments  Gait with conversation tasks; second bout of gait with short distance forward/backwards walking, with supervision.        [facility]) - 03/16/19 1338    PWR! exercises  Moves in sitting;Moves in standing    PWR! Up  x 10 reps     PWR! Rock  x 10 reps each side    PWR! Twist  x 10 reps each side    PWR Step  x 10 reps each side    Comments  PWR! Moves in standing, at outside of parallel bars (simulating counter at home)-visual, verbal cues for technique and amplitude    PWR! Up  x 10 reps    PWR! Rock  x 10 reps each side    PWR! Twist  x 10 reps each side    PWR! Step  x 10 reps each side (5 reps single step out and in, 5 reps both legs step out and in)    Comments  Seated PWR! Moves -performed with visual, verbal cues for technique and amplitude          PT Education - 03/16/19 1408    Education Details  Formally added in (again) to HEP-PWR! Moves for sitting, standing (pt has had these in the past and reports having handouts at home; does 2-3x/wk); educated to incr. frequency to 3-4 times per week at least    Person(s) Educated  Patient    Methods   Explanation;Demonstration;Verbal cues;Handout  Pt has handouts already at home   Comprehension  Verbalized understanding;Returned demonstration       PT Short Term Goals - 03/10/19 1016      PT SHORT TERM GOAL #1   Baseline  --        PT Long Term Goals - 03/10/19 1018      PT LONG TERM GOAL #1   Title  Pt will be independent with progression of HEP for decreased pain, improved mobility, strength, balance.  TARGET for all LTGS: 04/06/2019    Time  4    Period  Weeks    Status  On-going      PT LONG TERM GOAL #2   Title  FGA score to improve to 23/30 to decrease fall risk.    Baseline  21/30 02/27/2019    Time  4    Period  Weeks    Status  On-going      PT LONG TERM GOAL #3   Title  Pt will improve 5x sit<>stand to less than or equal to 15 seconds for improved functional lower extremity strength and improved car transfers.    Baseline  >17 sec 03/10/2019    Time  4    Period  Weeks    Status  On-going      PT LONG TERM GOAL #4   Title  Pt will report at least 50% improvement in car transfers.    Time  4    Period  Weeks    Status  New      PT LONG TERM GOAL #5   Title  Pt will verbalize plans for continued community fitness upon d/c from PT.    Time  4    Period  Weeks    Status  New            Plan - 03/16/19 1940    Clinical Impression Statement  Continued to work on seated, standing PWR! Moves for improved large intensity movement patterns as well as transfers from progressively lower surfaces.  Pt will continue to benefit from skilled PT to address balance, posture, and functional strengthe for improved overall functional mobility.    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 transplant    Examination-Activity Limitations  Bed Mobility;Transfers;Sit;Stand;Locomotion Level    Examination-Participation Restrictions  Meal Prep;Community Activity   Walking in  neighborhood   Stability/Clinical Decision Making  Evolving/Moderate complexity    Rehab Potential  Good    PT Frequency  2x / week    PT Duration  4 weeks   Per recert [id number]   PT Treatment/Interventions  ADLs/Self Care Home Management;Electrical Stimulation;Ultrasound;Traction;Moist Heat;Gait training;Functional mobility training;Therapeutic activities;Balance training;Therapeutic exercise;Neuromuscular re-education;Patient/family education;Aquatic Therapy    PT Next Visit Plan  Work on dynamic balance exercises (head motions, forward/back walking, obstacles), compliant surfaces; functional lower extremity strenghtening (sit<>stand from progressively lower surfaces simulating car transfer), step ups; seated/standing PWR! Moves for posture, weightshifting, trunk flexibility, initiating of stepping    Consulted and Agree with Plan of Care  Patient      *10th VISIT PROGRESS NOTE next visit-  Patient will benefit from skilled therapeutic intervention in order to improve the following deficits and impairments:  Abnormal gait, Decreased mobility, Decreased balance, Difficulty walking, Decreased strength, Postural dysfunction, Impaired flexibility  Visit Diagnosis: Muscle weakness (generalized)  Unsteadiness on feet  Other symptoms and signs involving the nervous system  Problem List Patient Active Problem List   Diagnosis Date Noted  . Bilateral calf pain 03/05/2019  . Genetic testing 02/13/2019  . Family history of ovarian cancer   . Family history of bladder cancer   . Family history of colon cancer   . Family history of leukemia   . Lumbar radiculopathy 12/08/2018  . Myofascial pain 12/08/2018  . Impaired gait and mobility 12/08/2018  . 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 Chronic nonallergic rhinitis 12/29/2016  . Mild persistent asthma, uncomplicated [id number]  . 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 Gastroesophageal reflux disease 12/29/2016  . Cervical  spondylosis with myelopathy and radiculopathy 11/14/2013  . Essential tremor 06/13/2013  . Cervical spondylosis without myelopathy 06/13/2013  . Breast cancer of lower-outer quadrant of left female breast ([place]) 09/10/2010    Samiyah Stupka W. 03/16/2019, 7:43 PM  Frazier Butt., PT   [facility] [address] [place], [place], [address] Phone: [phone]   Fax:  [phone]  Name: TAYTUM LEGACY MRN: [id number] Date of Birth: September 26, 1940

## 2019-03-16 NOTE — Therapy (Signed)
[facility] [address] [place], [place], [address] Phone: [phone]   Fax:  [phone]  Occupational Therapy Treatment  Patient Details  Name: April Church MRN: [id number] Date of Birth: [birth year] Referring Provider (OT): Dr. Andrey Spearman   Encounter Date: 03/16/2019  OT End of Session - 03/16/19 1235    Visit Number  7    Number of Visits  13    Date for OT Re-Evaluation  03/24/19    Authorization Type  Medicare & BCBS Supplement, covered 100%    Authorization Time Period  02/07/19-05/06/19    Authorization - Visit Number  7    Authorization - Number of Visits  10    OT Start Time  [id number]    OT Stop Time  1315    OT Time Calculation (min)  42 min    Activity Tolerance  Patient tolerated treatment well    Behavior During Therapy  [facility] for tasks assessed/performed       Past Medical History:  Diagnosis Date  . Asthma    daily inhaler  . Breast cancer ([place])    left- radiation and surgery -dx. 2011- no further tx. now- [name] , [name]  . Cyst of finger 11/2011   annular cyst right long finger  . Dental crowns present   . Dermatitis   . Family history of bladder cancer   . Family history of colon cancer   . Family history of leukemia   . Family history of ovarian cancer   . Frequency of urination   . GERD (gastroesophageal reflux disease)   . Hemorrhoid   . Hyperlipidemia   . Hypertension    under control, has been on med. x 2 yrs.  [name] PONV (postoperative nausea and vomiting)   . Seasonal allergies   . Tremors of nervous system    hands  . Trigger finger of right hand 11/2011   long finger    Past Surgical History:  Procedure Laterality Date  . ANTERIOR CERVICAL DECOMPRESSION/DISCECTOMY FUSION 4 LEVELS N/A 11/14/2013   Procedure: ANTERIOR CERVICAL DECOMPRESSION/DISCECTOMY FUSION 4 LEVELS;  Surgeon: Newman Pies, MD;  Location: [facility];  Service: Neurosurgery;  Laterality:  N/A;  C34 C45 C56 C67 anterior cervical fusion with interbody prosthesis plating and  bonegraft  . APPENDECTOMY  age 12  . BREAST LUMPECTOMY  06/16/2009   left; SLN bx.  [name] BREAST LUMPECTOMY  07/01/2009   re-excision  . BREAST SURGERY  1999   reduction  . CATARACT EXTRACTION, BILATERAL    . COLONOSCOPY WITH PROPOFOL N/A 12/03/2014   Procedure: COLONOSCOPY WITH PROPOFOL;  Surgeon: [name], MD;  Location: [facility];  Service: Endoscopy;  Laterality: N/A;  . FOOT SURGERY  06/22/11   left  . KNEE ARTHROSCOPY  03/12/2005   right  . KNEE ARTHROSCOPY     left  . NASAL SINUS SURGERY     x 2  . 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 TRIGGER FINGER RELEASE  12/16/2011   Procedure: RELEASE TRIGGER FINGER/A-1 PULLEY;  Surgeon: Tennis Must, MD;  Location: [facility];  Service: Orthopedics;  Laterality: Right;  RIGHT LONG FINGER TRIGGER RELEASE & ANNULAR CYST EXCISION  . TUMOR EXCISION  age 89   right arm    There were no vitals filed for this visit.  Subjective Assessment - 03/16/19 1234    Pertinent History  Parkinson's disease.   PMH:  hx of kidney donor, hx of L breast CA, ET, cervical spondylosis and cervical decompression/fusion surgery, hx of R 3rd digit trigger finger release and cyst, HTN, hyperlipidemia, hx of RUE tumor excision, lumbar radiculopathy (currently being treated for this by Dr. Letta Pate and PT)    Limitations  currently being treated for lumbar radiculopathy    Patient Stated Goals  improve coordination, writing, R hand function    Currently in Pain?  Yes    Pain Score  2     Pain Location  Leg    Pain Orientation  Right;Left    Pain Descriptors / Indicators  Aching    Pain Type  Acute pain    Pain Radiating Towards  back of legs/knees    Pain Onset  More than a month ago    Pain Frequency  Intermittent    Aggravating Factors   inactivity    Pain Relieving Factors  movement       Placing pieces in  Purdue Pegboard with R hand with min cueing for use of tip pinch with min difficulty.  Writing "Thank You" note on lined paper (printing) with good legibility and no decr in size.  Then copied sentences with good legibility/size with incr time.  Then continuous "l" working on size and smooth, controlled movement with min difficulty due to tremors.  Standing, functional step and reach to each side to flip cards with focus on trunk rotation, wt. Shift, supination, finger/elbow ext with min v.c. for large amplitude movements.       OT Short Term Goals - 03/13/19 1504      OT SHORT TERM GOAL #1   Title  Pt will be independent with updated HEP--check STGs  03/09/19.    Time  4    Period  Weeks    Status  On-going      OT SHORT TERM GOAL #2   Title  Pt will be able to write short paragraph/check with 100% legibility using strategies/AE prn.    Time  4    Period  Weeks    Status  Achieved      OT SHORT TERM GOAL #3   Title  Pt will demonstrate ability to retrieve items from overhead shelf with -10 elbow extension for bilateral UE's    Baseline  RUE sh. flex 130, elbow ext -15 LUE sh. flex  125 elbow ext -15    Time  4    Period  Weeks    Status  On-going   RUE -10, LUE -15     OT SHORT TERM GOAL #4   Title  Pt will be able to fasten/unfasten 3 buttons in less than 38sec.    Baseline  42.84    Time  4    Period  Weeks    Status  Achieved   31.64 secs     OT SHORT TERM GOAL #5   Title  ----        OT Long Term Goals - 03/16/19 1237      OT LONG TERM GOAL #1   Title  Pt will verbalize understanding of strategies/AE to incr ease with ADLs.--check LTGs 03/25/19    Time  6    Period  Weeks    Status  Achieved      OT LONG TERM GOAL #2   Title  Pt will improve coordination for ADLs as shown by improving time on 9-hole peg test by at least 3sec with R hand.  Baseline  40.72    Time  6    Period  Weeks    Status  Achieved   32.85 secs     OT LONG TERM GOAL #3   Title   Pt will improve coordination/functional reaching for ADLs as shown by improving score on box and blocks with RUE by at least 4 blocks    Baseline  30 blocks    Time  6    Period  Weeks    Status  Achieved   42 blocks     OT LONG TERM GOAL #4   Title  Pt will be able to pick up small objects using tip pinch at least 75% of the time with R hand.    Time  6    Period  Weeks    Status  On-going   Pt reports 50% of the time but she demonstrate improved performance     OT LONG TERM GOAL #5   Title  ----            Plan - 03/16/19 1235    Clinical Impression Statement  Pt is progressing towards goals with improving coordination and use of tip pinch (but needs cueing to use tip pinch).    OT Occupational Profile and History  Detailed Assessment- Review of Records and additional review of physical, cognitive, psychosocial history related to current functional performance    Occupational performance deficits (Please refer to evaluation for details):  ADL's;IADL's;Leisure    Body Structure / Function / Physical Skills  ADL;Dexterity;ROM;IADL;Balance;Mobility;Strength;Tone;FMC;Coordination;Pain;UE functional use;GMC;Improper spinal/pelvic alignment;Endurance    Rehab Potential  Good    Clinical Decision Making  Several treatment options, min-mod task modification necessary    Comorbidities Affecting Occupational Performance:  May have comorbidities impacting occupational performance    Modification or Assistance to Complete Evaluation   Min-Moderate modification of tasks or assist with assess necessary to complete eval    OT Frequency  2x / week    OT Duration  6 weeks   +eval   OT Treatment/Interventions  Self-care/ADL training;Moist Heat;Fluidtherapy;DME and/or AE instruction;Splinting;Balance training;Therapeutic activities;Aquatic Therapy;Ultrasound;Therapeutic exercise;Cognitive remediation/compensation;Passive range of motion;Functional Mobility Training;Neuromuscular education;Energy  conservation;Manual Therapy;Patient/family education;Paraffin    Plan  continue with ADL strategies    Consulted and Agree with Plan of Care  Patient       Patient will benefit from skilled therapeutic intervention in order to improve the following deficits and impairments:   Body Structure / Function / Physical Skills: ADL, Dexterity, ROM, IADL, Balance, Mobility, Strength, Tone, FMC, Coordination, Pain, UE functional use, GMC, Improper spinal/pelvic alignment, Endurance       Visit Diagnosis: Other symptoms and signs involving the nervous system  Other symptoms and signs involving the musculoskeletal system  Other lack of coordination  Abnormal posture  Muscle weakness (generalized)  Tremor  Unsteadiness on feet  Other abnormalities of gait and mobility    Problem List Patient Active Problem List   Diagnosis Date Noted  . Bilateral calf pain 03/05/2019  . Genetic testing 02/13/2019  . Family history of ovarian cancer   . Family history of bladder cancer   . Family history of colon cancer   . Family history of leukemia   . Lumbar radiculopathy 12/08/2018  . Myofascial pain 12/08/2018  . Impaired gait and mobility 12/08/2018  . 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 Chronic nonallergic rhinitis 12/29/2016  . Mild persistent asthma, uncomplicated [id number]  . 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 Gastroesophageal reflux disease 12/29/2016  .  Cervical spondylosis with myelopathy and radiculopathy 11/14/2013  . Essential tremor 06/13/2013  . Cervical spondylosis without myelopathy 06/13/2013  . Breast cancer of lower-outer quadrant of left female breast [facility]) 09/10/2010    [facility] 03/16/2019, 12:38 PM  [facility] [address] [place], [place], [address] Phone: [phone]   Fax:  [phone]  Name: April Church MRN: [id number] Date of Birth: 1940/08/07   Vianne Bulls,  OTR/L [facility] [address] [place], [place]  [address] [phone] phone [phone] 03/16/19 12:38 PM

## 2019-03-16 NOTE — Patient Instructions (Addendum)
      Recommend pt perform 3-4 times per week at least; 10 reps, once per day

## 2019-03-21 ENCOUNTER — Ambulatory Visit: Payer: Medicare Other | Admitting: Occupational Therapy

## 2019-03-21 ENCOUNTER — Other Ambulatory Visit: Payer: Self-pay

## 2019-03-21 ENCOUNTER — Ambulatory Visit: Payer: Medicare Other | Admitting: Physical Therapy

## 2019-03-21 DIAGNOSIS — R278 Other lack of coordination: Secondary | ICD-10-CM

## 2019-03-21 DIAGNOSIS — R2689 Other abnormalities of gait and mobility: Secondary | ICD-10-CM | POA: Diagnosis not present

## 2019-03-21 DIAGNOSIS — R29898 Other symptoms and signs involving the musculoskeletal system: Secondary | ICD-10-CM

## 2019-03-21 DIAGNOSIS — R2681 Unsteadiness on feet: Secondary | ICD-10-CM

## 2019-03-21 DIAGNOSIS — R293 Abnormal posture: Secondary | ICD-10-CM

## 2019-03-21 DIAGNOSIS — M6281 Muscle weakness (generalized): Secondary | ICD-10-CM

## 2019-03-21 DIAGNOSIS — R29818 Other symptoms and signs involving the nervous system: Secondary | ICD-10-CM

## 2019-03-21 NOTE — Therapy (Signed)
[facility] [address] [place], [place], [address] Phone: [phone]   Fax:  [phone]  Physical Therapy Treatment/10th Visit Progress Report  Patient Details  Name: April Church MRN: [id number] Date of Birth: [birth year] Referring Provider (PT): Courtney Heys   Encounter Date: 03/21/2019  PT End of Session - 03/21/19 1801    Visit Number  10    Number of Visits  15    Date for PT Re-Evaluation  05/08/19    Authorization Type  Medicare/BCBS-Will need 10th visit progress note    PT Start Time  1449    PT Stop Time  1529    PT Time Calculation (min)  40 min    Equipment Utilized During Treatment  Gait belt    Activity Tolerance  Patient tolerated treatment well    Behavior During Therapy  [facility] for tasks assessed/performed       Past Medical History:  Diagnosis Date  . Asthma    daily inhaler  . Breast cancer ([place])    left- radiation and surgery -dx. 2011- no further tx. now- [name] , [name]  . Cyst of finger 11/2011   annular cyst right long finger  . Dental crowns present   . Dermatitis   . Family history of bladder cancer   . Family history of colon cancer   . Family history of leukemia   . Family history of ovarian cancer   . Frequency of urination   . GERD (gastroesophageal reflux disease)   . Hemorrhoid   . Hyperlipidemia   . Hypertension    under control, has been on med. x 2 yrs.  [name] PONV (postoperative nausea and vomiting)   . Seasonal allergies   . Tremors of nervous system    hands  . Trigger finger of right hand 11/2011   long finger    Past Surgical History:  Procedure Laterality Date  . ANTERIOR CERVICAL DECOMPRESSION/DISCECTOMY FUSION 4 LEVELS N/A 11/14/2013   Procedure: ANTERIOR CERVICAL DECOMPRESSION/DISCECTOMY FUSION 4 LEVELS;  Surgeon: Newman Pies, MD;  Location: [facility];  Service: Neurosurgery;  Laterality: N/A;  C34 C45 C56 C67 anterior cervical fusion  with interbody prosthesis plating and  bonegraft  . APPENDECTOMY  age 79  . BREAST LUMPECTOMY  06/16/2009   left; SLN bx.  [name] BREAST LUMPECTOMY  07/01/2009   re-excision  . BREAST SURGERY  1999   reduction  . CATARACT EXTRACTION, BILATERAL    . COLONOSCOPY WITH PROPOFOL N/A 12/03/2014   Procedure: COLONOSCOPY WITH PROPOFOL;  Surgeon: [name], MD;  Location: [facility];  Service: Endoscopy;  Laterality: N/A;  . FOOT SURGERY  06/22/11   left  . KNEE ARTHROSCOPY  03/12/2005   right  . KNEE ARTHROSCOPY     left  . NASAL SINUS SURGERY     x 2  . 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 TRIGGER FINGER RELEASE  12/16/2011   Procedure: RELEASE TRIGGER FINGER/A-1 PULLEY;  Surgeon: Tennis Must, MD;  Location: [facility];  Service: Orthopedics;  Laterality: Right;  RIGHT LONG FINGER TRIGGER RELEASE & ANNULAR CYST EXCISION  . TUMOR EXCISION  age 79   right arm    There were no vitals filed for this visit.  Subjective Assessment - 03/21/19 1450    Subjective  Going to the orthopedist on 2/1 for injections in my knee, so I hope that helps.    Pertinent History  Hx of Parkinson's disease; donated kidney to husband; hx of breast cancer    How long can you walk comfortably?  [place] and [place]; and that's the most she can do before leg pain limits    Diagnostic tests  MRI 09/2018, shows multi leve disc bulge lumbar spine    Currently in Pain?  Yes    Pain Score  2     Pain Location  Knee   lower leg   Pain Orientation  Right;Left    Pain Descriptors / Indicators  Aching    Pain Onset  More than a month ago    Pain Frequency  Intermittent    Aggravating Factors   getting out of car, getting up from lower surfaces, first getting up from the bed    Pain Relieving Factors  movement                       [facility] Adult PT Treatment/Exercise - 03/21/19 0001      Transfers   Transfers   Sit to Stand;Stand to Sit    Sit to Stand  5: Supervision;Without upper extremity assist;From chair/3-in-1    Stand to Sit  5: Supervision;Without upper extremity assist;To chair/3-in-1    Number of Reps  Other reps (comment)   3 reps from chair,    Transfer Cueing  Cues provided for ease of transfers:  foot placement, forward lean, increased momentum    Comments  Cues upon standing, for widened BOS and lateral weightshifting.  Also, provided cues for quad activation for improved terminal knee extension.      Ambulation/Gait   Ambulation/Gait  Yes    Ambulation/Gait Assistance  5: Supervision;4: Min guard    Ambulation Distance (Feet)  300 Feet   720   Assistive device  None    Gait Pattern  Step-through pattern;Decreased arm swing - right;Decreased step length - right;Narrow base of support    Ambulation Surface  Level;Indoor    Gait velocity  10.12 sec = 3.24 ft/sec    Pre-Gait Activities  Along 20-40 ft distances in gym:  forward/back walking x 2 reps with supervision, forwasrd walking with head turns to name objects, head nods with supervision.    Gait Comments  3 Minute walk test:  660 ft       Knee/Hip Exercises: Stretches   Active Hamstring Stretch  Right;Left;3 reps;30 seconds         Additional 3-5 sit<>stand reps throughout session, cues for full knee extension upon standing.   Neuro Re-education  Pt performs PWR! Moves in seated position x 10 reps   PWR! Up for improved posture  PWR! Rock for improved weighshifting  PWR! Twist for improved trunk rotation   PWR! Step for improved step initiation   Cues provided for technique, large amplitude, pause with open posture at midline during PWR! Twist.         PT Short Term Goals - 03/10/19 1016      PT SHORT TERM GOAL #1   Baseline  --        PT Long Term Goals - 03/10/19 1018      PT LONG TERM GOAL #1   Title  Pt will be independent with progression of HEP for decreased pain, improved mobility,  strength, balance.  TARGET for all LTGS: 04/06/2019    Time  4    Period  Weeks    Status  On-going  PT LONG TERM GOAL #2   Title  FGA score to improve to 23/30 to decrease fall risk.    Baseline  21/30 02/27/2019    Time  4    Period  Weeks    Status  On-going      PT LONG TERM GOAL #3   Title  Pt will improve 5x sit<>stand to less than or equal to 15 seconds for improved functional lower extremity strength and improved car transfers.    Baseline  >17 sec 03/10/2019    Time  4    Period  Weeks    Status  On-going      PT LONG TERM GOAL #4   Title  Pt will report at least 50% improvement in car transfers.    Time  4    Period  Weeks    Status  New      PT LONG TERM GOAL #5   Title  Pt will verbalize plans for continued community fitness upon d/c from PT.    Time  4    Period  Weeks    Status  New            Plan - 03/21/19 1802    Clinical Impression Statement  10th Visit PROGRESS NOTE, covering dates 01/04/2019-03/21/2019:  Gait velocity 3.25 ft/sec, 3 minute walk test:  660 ft no device; FGA score 21/30 (02/27/2019).  Pt continues to have slowing with gait with head motions, environmental scanning with gait, she has difficulty with car transfers.  Her back pain has decreased per patient report, but bilateral knee and calf pain is a limiting factor with transfer training.  Goals were recently checked, with pt meeting 1 of 5 LTGs.  She will benefit from skilled PT to further address balance, gait training for improved overall functional mobility and decreased fall risk.    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 transplant    Examination-Activity Limitations  Bed Mobility;Transfers;Sit;Stand;Locomotion Level    Examination-Participation Restrictions  Meal Prep;Community Activity   Walking in neighborhood   Stability/Clinical Decision Making  Evolving/Moderate complexity    Rehab  Potential  Good    PT Frequency  2x / week    PT Duration  4 weeks   Per recert [id number]   PT Treatment/Interventions  ADLs/Self Care Home Management;Electrical Stimulation;Ultrasound;Traction;Moist Heat;Gait training;Functional mobility training;Therapeutic activities;Balance training;Therapeutic exercise;Neuromuscular re-education;Patient/family education;Aquatic Therapy    PT Next Visit Plan  Work on dynamic balance exercises (head motions, forward/back walking, obstacles), compliant surfaces; standing PWR! Moves-on compliant surfaces; functional lower extremity strenghtening (sit<>stand from progressively lower surfaces simulating car transfer), step ups; seated/standing PWR! Moves for posture, weightshifting, trunk flexibility, initiating of stepping    Consulted and Agree with Plan of Care  Patient       Patient will benefit from skilled therapeutic intervention in order to improve the following deficits and impairments:  Abnormal gait, Decreased mobility, Decreased balance, Difficulty walking, Decreased strength, Postural dysfunction, Impaired flexibility  Visit Diagnosis: Other abnormalities of gait and mobility  Unsteadiness on feet  Muscle weakness (generalized)  Other symptoms and signs involving the nervous system     Problem List Patient Active Problem List   Diagnosis Date Noted  . Bilateral calf pain 03/05/2019  . Genetic testing 02/13/2019  . Family history of ovarian cancer   . Family history of bladder cancer   . Family history of colon cancer   .  Family history of leukemia   . Lumbar radiculopathy 12/08/2018  . Myofascial pain 12/08/2018  . Impaired gait and mobility 12/08/2018  . 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 Chronic nonallergic rhinitis 12/29/2016  . Mild persistent asthma, uncomplicated [id number]  . 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 Gastroesophageal reflux disease 12/29/2016  . Cervical spondylosis with myelopathy and radiculopathy  11/14/2013  . Essential tremor 06/13/2013  . Cervical spondylosis without myelopathy 06/13/2013  . Breast cancer of lower-outer quadrant of left female breast ([place]) 09/10/2010    Deigo Alonso W. 03/21/2019, 6:10 PM  Frazier Butt., PT   [facility] [address] [place], [place], [address] Phone: [phone]   Fax:  [phone]  Name: JAALA NUTE MRN: [id number] Date of Birth: 07/25/1940

## 2019-03-22 NOTE — Therapy (Signed)
[facility] [address] [place], [place], [address] Phone: [phone]   Fax:  [phone]  Occupational Therapy Treatment  Patient Details  Name: April Church MRN: [id number] Date of Birth: 07/29/1940 Referring Provider (OT): Dr. Andrey Spearman   Encounter Date: 03/21/2019  OT End of Session - 03/22/19 0949    Number of Visits  13    Date for OT Re-Evaluation  03/24/19    Authorization Type  Medicare & BCBS Supplement, covered 100%    Authorization Time Period  02/07/19-05/06/19    Authorization - Visit Number  7    Authorization - Number of Visits  10    OT Start Time  1540    OT Stop Time  1620    OT Time Calculation (min)  40 min    Activity Tolerance  Patient tolerated treatment well    Behavior During Therapy  [facility] for tasks assessed/performed       Past Medical History:  Diagnosis Date  . Asthma    daily inhaler  . Breast cancer ([place])    left- radiation and surgery -dx. 2011- no further tx. now- [name] , [name]  . Cyst of finger 11/2011   annular cyst right long finger  . Dental crowns present   . Dermatitis   . Family history of bladder cancer   . Family history of colon cancer   . Family history of leukemia   . Family history of ovarian cancer   . Frequency of urination   . GERD (gastroesophageal reflux disease)   . Hemorrhoid   . Hyperlipidemia   . Hypertension    under control, has been on med. x 2 yrs.  [name] PONV (postoperative nausea and vomiting)   . Seasonal allergies   . Tremors of nervous system    hands  . Trigger finger of right hand 11/2011   long finger    Past Surgical History:  Procedure Laterality Date  . ANTERIOR CERVICAL DECOMPRESSION/DISCECTOMY FUSION 4 LEVELS N/A 11/14/2013   Procedure: ANTERIOR CERVICAL DECOMPRESSION/DISCECTOMY FUSION 4 LEVELS;  Surgeon: Newman Pies, MD;  Location: [place] [facility];  Service: Neurosurgery;  Laterality: N/A;  C34 C45 C56 C67  anterior cervical fusion with interbody prosthesis plating and  bonegraft  . APPENDECTOMY  age 66  . BREAST LUMPECTOMY  06/16/2009   left; SLN bx.  [name] BREAST LUMPECTOMY  07/01/2009   re-excision  . BREAST SURGERY  1999   reduction  . CATARACT EXTRACTION, BILATERAL    . COLONOSCOPY WITH PROPOFOL N/A 12/03/2014   Procedure: COLONOSCOPY WITH PROPOFOL;  Surgeon: [name], MD;  Location: [facility];  Service: Endoscopy;  Laterality: N/A;  . FOOT SURGERY  06/22/11   left  . KNEE ARTHROSCOPY  03/12/2005   right  . KNEE ARTHROSCOPY     left  . NASAL SINUS SURGERY     x 2  . 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 TRIGGER FINGER RELEASE  12/16/2011   Procedure: RELEASE TRIGGER FINGER/A-1 PULLEY;  Surgeon: Tennis Must, MD;  Location: [facility];  Service: Orthopedics;  Laterality: Right;  RIGHT LONG FINGER TRIGGER RELEASE & ANNULAR CYST EXCISION  . TUMOR EXCISION  age 79   right arm    There were no vitals filed for this visit.  Subjective Assessment - 03/22/19 0951    Pertinent History  Parkinson's disease.   PMH:  hx of kidney donor, hx of  L breast CA, ET, cervical spondylosis and cervical decompression/fusion surgery, hx of R 3rd digit trigger finger release and cyst, HTN, hyperlipidemia, hx of RUE tumor excision, lumbar radiculopathy (currently being treated for this by Dr. Letta Pate and PT)    Limitations  currently being treated for lumbar radiculopathy    Patient Stated Goals  improve coordination, writing, R hand function    Currently in Pain?  Yes    Pain Score  2     Pain Location  Knee    Pain Descriptors / Indicators  Aching    Pain Onset  More than a month ago    Pain Frequency  Intermittent    Aggravating Factors   walking    Pain Relieving Factors  rest           Treatment: PWR! Hands 10 reps each Pt picked up tiny connect 4 pieces with tip pinch to place in connect 4 frame, min difficulty/ v.c to simulate  taking pills. Grooved pegboard for increased fine motor coordination and tip pinch, min-mod difficulty, min v.c Bag exercises for simulated ADLS: crumpling bag with each hand to simulate donning socks, simulated donning pants and drying back, min v.c for techniques. Dual tasking: Ambulating while tossing ball between hands and performing category generation,  min difficulty/ v.c                  OT Short Term Goals - 03/13/19 1504      OT SHORT TERM GOAL #1   Title  Pt will be independent with updated HEP--check STGs  03/09/19.    Time  4    Period  Weeks    Status  On-going      OT SHORT TERM GOAL #2   Title  Pt will be able to write short paragraph/check with 100% legibility using strategies/AE prn.    Time  4    Period  Weeks    Status  Achieved      OT SHORT TERM GOAL #3   Title  Pt will demonstrate ability to retrieve items from overhead shelf with -10 elbow extension for bilateral UE's    Baseline  RUE sh. flex 130, elbow ext -15 LUE sh. flex  125 elbow ext -15    Time  4    Period  Weeks    Status  On-going   RUE -10, LUE -15     OT SHORT TERM GOAL #4   Title  Pt will be able to fasten/unfasten 3 buttons in less than 38sec.    Baseline  42.84    Time  4    Period  Weeks    Status  Achieved   31.64 secs     OT SHORT TERM GOAL #5   Title  ----        OT Long Term Goals - 03/16/19 1237      OT LONG TERM GOAL #1   Title  Pt will verbalize understanding of strategies/AE to incr ease with ADLs.--check LTGs 03/25/19    Time  6    Period  Weeks    Status  Achieved      OT LONG TERM GOAL #2   Title  Pt will improve coordination for ADLs as shown by improving time on 9-hole peg test by at least 3sec with R hand.    Baseline  40.72    Time  6    Period  Weeks    Status  Achieved   32.85 secs  OT LONG TERM GOAL #3   Title  Pt will improve coordination/functional reaching for ADLs as shown by improving score on box and blocks with RUE by at least 4  blocks    Baseline  30 blocks    Time  6    Period  Weeks    Status  Achieved   42 blocks     OT LONG TERM GOAL #4   Title  Pt will be able to pick up small objects using tip pinch at least 75% of the time with R hand.    Time  6    Period  Weeks    Status  On-going   Pt reports 50% of the time but she demonstrate improved performance     OT LONG TERM GOAL #5   Title  ----            Plan - 03/22/19 [id number]    Clinical Impression Statement  Pt demonstrates good overall progress. Anticipate d/c next visit.    OT Occupational Profile and History  Detailed Assessment- Review of Records and additional review of physical, cognitive, psychosocial history related to current functional performance    Occupational performance deficits (Please refer to evaluation for details):  ADL's;IADL's;Leisure    Body Structure / Function / Physical Skills  ADL;Dexterity;ROM;IADL;Balance;Mobility;Strength;Tone;FMC;Coordination;Pain;UE functional use;GMC;Improper spinal/pelvic alignment;Endurance    Rehab Potential  Good    Clinical Decision Making  Several treatment options, min-mod task modification necessary    Comorbidities Affecting Occupational Performance:  May have comorbidities impacting occupational performance    Modification or Assistance to Complete Evaluation   Min-Moderate modification of tasks or assist with assess necessary to complete eval    OT Frequency  2x / week    OT Duration  6 weeks   +eval   OT Treatment/Interventions  Self-care/ADL training;Moist Heat;Fluidtherapy;DME and/or AE instruction;Splinting;Balance training;Therapeutic activities;Aquatic Therapy;Ultrasound;Therapeutic exercise;Cognitive remediation/compensation;Passive range of motion;Functional Mobility Training;Neuromuscular education;Energy conservation;Manual Therapy;Patient/family education;Paraffin    Plan  d/c OT next visit    Consulted and Agree with Plan of Care  Patient       Patient will benefit from  skilled therapeutic intervention in order to improve the following deficits and impairments:   Body Structure / Function / Physical Skills: ADL, Dexterity, ROM, IADL, Balance, Mobility, Strength, Tone, FMC, Coordination, Pain, UE functional use, GMC, Improper spinal/pelvic alignment, Endurance       Visit Diagnosis: Other symptoms and signs involving the nervous system  Other symptoms and signs involving the musculoskeletal system  Other lack of coordination  Abnormal posture  Muscle weakness (generalized)    Problem List Patient Active Problem List   Diagnosis Date Noted  . Bilateral calf pain 03/05/2019  . Genetic testing 02/13/2019  . Family history of ovarian cancer   . Family history of bladder cancer   . Family history of colon cancer   . Family history of leukemia   . Lumbar radiculopathy 12/08/2018  . Myofascial pain 12/08/2018  . Impaired gait and mobility 12/08/2018  . 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 Chronic nonallergic rhinitis 12/29/2016  . Mild persistent asthma, uncomplicated [id number]  . 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 Gastroesophageal reflux disease 12/29/2016  . Cervical spondylosis with myelopathy and radiculopathy 11/14/2013  . Essential tremor 06/13/2013  . Cervical spondylosis without myelopathy 06/13/2013  . Breast cancer of lower-outer quadrant of left female breast ([place]) 09/10/2010    Quamaine Webb 03/22/2019, 9:52 AM  [facility] [address] [place], [place],  [id number] Phone: [phone]   Fax:  [phone]  Name: April Church MRN: [id number] Date of Birth: 06-May-1940

## 2019-03-23 ENCOUNTER — Ambulatory Visit: Payer: Medicare Other | Admitting: Physical Therapy

## 2019-03-23 ENCOUNTER — Encounter: Payer: Self-pay | Admitting: Occupational Therapy

## 2019-03-23 ENCOUNTER — Ambulatory Visit: Payer: Medicare Other | Admitting: Occupational Therapy

## 2019-03-23 ENCOUNTER — Encounter: Payer: Self-pay | Admitting: Physical Therapy

## 2019-03-23 ENCOUNTER — Other Ambulatory Visit: Payer: Self-pay

## 2019-03-23 DIAGNOSIS — R29898 Other symptoms and signs involving the musculoskeletal system: Secondary | ICD-10-CM

## 2019-03-23 DIAGNOSIS — R29818 Other symptoms and signs involving the nervous system: Secondary | ICD-10-CM | POA: Diagnosis not present

## 2019-03-23 DIAGNOSIS — R251 Tremor, unspecified: Secondary | ICD-10-CM

## 2019-03-23 DIAGNOSIS — R2689 Other abnormalities of gait and mobility: Secondary | ICD-10-CM | POA: Diagnosis not present

## 2019-03-23 DIAGNOSIS — R2681 Unsteadiness on feet: Secondary | ICD-10-CM

## 2019-03-23 DIAGNOSIS — M6281 Muscle weakness (generalized): Secondary | ICD-10-CM

## 2019-03-23 DIAGNOSIS — R278 Other lack of coordination: Secondary | ICD-10-CM | POA: Diagnosis not present

## 2019-03-23 DIAGNOSIS — R293 Abnormal posture: Secondary | ICD-10-CM

## 2019-03-23 NOTE — Therapy (Signed)
[facility] [address] [place], [place], [address] Phone: [phone]   Fax:  [phone]  Occupational Therapy Treatment  Patient Details  Name: SANIYYAH ELSTER MRN: [id number] Date of Birth: [birth year] Referring Provider (OT): Dr. Andrey Spearman   Encounter Date: 03/23/2019  OT End of Session - 03/23/19 1025    Visit Number  9    Number of Visits  13    Date for OT Re-Evaluation  03/24/19    Authorization Type  Medicare & BCBS Supplement, covered 100%    Authorization Time Period  02/07/19-05/06/19    Authorization - Visit Number  9    Authorization - Number of Visits  10    OT Start Time  1022    OT Stop Time  1100    OT Time Calculation (min)  38 min    Activity Tolerance  Patient tolerated treatment well    Behavior During Therapy  [facility] for tasks assessed/performed       Past Medical History:  Diagnosis Date  . Asthma    daily inhaler  . Breast cancer ([place])    left- radiation and surgery -dx. 2011- no further tx. now- [name] , [name]  . Cyst of finger 11/2011   annular cyst right long finger  . Dental crowns present   . Dermatitis   . Family history of bladder cancer   . Family history of colon cancer   . Family history of leukemia   . Family history of ovarian cancer   . Frequency of urination   . GERD (gastroesophageal reflux disease)   . Hemorrhoid   . Hyperlipidemia   . Hypertension    under control, has been on med. x 2 yrs.  [name] PONV (postoperative nausea and vomiting)   . Seasonal allergies   . Tremors of nervous system    hands  . Trigger finger of right hand 11/2011   long finger    Past Surgical History:  Procedure Laterality Date  . ANTERIOR CERVICAL DECOMPRESSION/DISCECTOMY FUSION 4 LEVELS N/A 11/14/2013   Procedure: ANTERIOR CERVICAL DECOMPRESSION/DISCECTOMY FUSION 4 LEVELS;  Surgeon: Newman Pies, MD;  Location: [place];  Service: Neurosurgery;  Laterality:  N/A;  C34 C45 C56 C67 anterior cervical fusion with interbody prosthesis plating and  bonegraft  . APPENDECTOMY  age 74  . BREAST LUMPECTOMY  06/16/2009   left; SLN bx.  [name] BREAST LUMPECTOMY  07/01/2009   re-excision  . BREAST SURGERY  1999   reduction  . CATARACT EXTRACTION, BILATERAL    . COLONOSCOPY WITH PROPOFOL N/A 12/03/2014   Procedure: COLONOSCOPY WITH PROPOFOL;  Surgeon: [name], MD;  [facility];  Service: Endoscopy;  Laterality: N/A;  . FOOT SURGERY  06/22/11   left  . KNEE ARTHROSCOPY  03/12/2005   right  . KNEE ARTHROSCOPY     left  . NASAL SINUS SURGERY     x 2  . 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 TRIGGER FINGER RELEASE  12/16/2011   Procedure: RELEASE TRIGGER FINGER/A-1 PULLEY;  Surgeon: Tennis Must, MD;  Location: [facility];  Service: Orthopedics;  Laterality: Right;  RIGHT LONG FINGER TRIGGER RELEASE & ANNULAR CYST EXCISION  . TUMOR EXCISION  age 32   right arm    There were no vitals filed for this visit.  Subjective Assessment - 03/23/19 1023    Subjective   my writing still gives me  the most trouble    Pertinent History  Parkinson's disease.   PMH:  hx of kidney donor, hx of L breast CA, ET, cervical spondylosis and cervical decompression/fusion surgery, hx of R 3rd digit trigger finger release and cyst, HTN, hyperlipidemia, hx of RUE tumor excision, lumbar radiculopathy (currently being treated for this by Dr. Letta Pate and PT)    Limitations  currently being treated for lumbar radiculopathy    Patient Stated Goals  improve coordination, writing, R hand function    Currently in Pain?  Yes    Pain Score  4     Pain Location  Leg    Pain Orientation  Right;Left    Pain Descriptors / Indicators  Aching    Pain Type  Acute pain    Pain Radiating Towards  back of legs/knees    Pain Onset  More than a month ago    Aggravating Factors   walking, sit>stand    Pain Relieving Factors  rest        Checked remaining goals and discussed progress.--see below  Placing medium cylinder pegs in pegboard with RUE with min-mod cueing for use of tip pinch.    Standing, PWR! Rock to reach to targets with min v.c.  Picking up mini connect 4 pieces to place in board with good ability to use tip pinch, min v.c.  Writing sentences in cursive with good legibility/size.  Continuous "l" with min difficulty for smooth, controlled movements due to tremors.  Placing small pegs in pegboard with focus on tip pinch with min difficulty/cueing.  Reviewed importance of HEP, extending arm with reaching.        OT Short Term Goals - 03/23/19 1026      OT SHORT TERM GOAL #1   Title  Pt will be independent with updated HEP--check STGs  03/09/19.    Time  4    Period  Weeks    Status  Achieved      OT SHORT TERM GOAL #2   Title  Pt will be able to write short paragraph/check with 100% legibility using strategies/AE prn.    Time  4    Period  Weeks    Status  Achieved      OT SHORT TERM GOAL #3   Title  Pt will demonstrate ability to retrieve items from overhead shelf with -10 elbow extension for bilateral UE's    Baseline  RUE sh. flex 130, elbow ext -15 LUE sh. flex  125 elbow ext -15    Time  4    Period  Weeks    Status  Achieved   RUE -10, LUE -15.  03/23/19:  RUE -10*, LUE -5     OT SHORT TERM GOAL #4   Title  Pt will be able to fasten/unfasten 3 buttons in less than 38sec.    Baseline  42.84    Time  4    Period  Weeks    Status  Achieved   31.64 secs     OT SHORT TERM GOAL #5   Title  ----        OT Long Term Goals - 03/23/19 1028      OT LONG TERM GOAL #1   Title  Pt will verbalize understanding of strategies/AE to incr ease with ADLs.--check LTGs 03/25/19    Time  6    Period  Weeks    Status  Achieved      OT LONG TERM GOAL #2   Title  Pt will improve coordination for ADLs as shown by improving time on 9-hole peg test by at least 3sec with R hand.    Baseline   40.72    Time  6    Period  Weeks    Status  Achieved   32.85 secs     OT LONG TERM GOAL #3   Title  Pt will improve coordination/functional reaching for ADLs as shown by improving score on box and blocks with RUE by at least 4 blocks    Baseline  30 blocks    Time  6    Period  Weeks    Status  Achieved   42 blocks     OT LONG TERM GOAL #4   Title  Pt will be able to pick up small objects using tip pinch at least 75% of the time with R hand.    Time  6    Period  Weeks    Status  Not Met   Pt reports 50% of the time but she demonstrate improved performance.  03/23/19:  inconsistent/needs cueing     OT LONG TERM GOAL #5   Title  ----            Plan - 03/23/19 1025    Clinical Impression Statement  Pt has made good progress and is appropriate for OT d/c at this time.    OT Occupational Profile and History  Detailed Assessment- Review of Records and additional review of physical, cognitive, psychosocial history related to current functional performance    Occupational performance deficits (Please refer to evaluation for details):  ADL's;IADL's;Leisure    Body Structure / Function / Physical Skills  ADL;Dexterity;ROM;IADL;Balance;Mobility;Strength;Tone;FMC;Coordination;Pain;UE functional use;GMC;Improper spinal/pelvic alignment;Endurance    Rehab Potential  Good    Clinical Decision Making  Several treatment options, min-mod task modification necessary    Comorbidities Affecting Occupational Performance:  May have comorbidities impacting occupational performance    Modification or Assistance to Complete Evaluation   Min-Moderate modification of tasks or assist with assess necessary to complete eval    OT Frequency  2x / week    OT Duration  6 weeks   +eval   OT Treatment/Interventions  Self-care/ADL training;Moist Heat;Fluidtherapy;DME and/or AE instruction;Splinting;Balance training;Therapeutic activities;Aquatic Therapy;Ultrasound;Therapeutic exercise;Cognitive  remediation/compensation;Passive range of motion;Functional Mobility Training;Neuromuscular education;Energy conservation;Manual Therapy;Patient/family education;Paraffin    Plan  d/c OT.  Recommend re-eval/screen in approx 6-67month    Consulted and Agree with Plan of Care  Patient       Patient will benefit from skilled therapeutic intervention in order to improve the following deficits and impairments:   Body Structure / Function / Physical Skills: ADL, Dexterity, ROM, IADL, Balance, Mobility, Strength, Tone, FMC, Coordination, Pain, UE functional use, GMC, Improper spinal/pelvic alignment, Endurance       Visit Diagnosis: Other symptoms and signs involving the nervous system  Other symptoms and signs involving the musculoskeletal system  Other lack of coordination  Abnormal posture  Muscle weakness (generalized)  Other abnormalities of gait and mobility  Unsteadiness on feet  Tremor    Problem List Patient Active Problem List   Diagnosis Date Noted  . Bilateral calf pain 03/05/2019  . Genetic testing 02/13/2019  . Family history of ovarian cancer   . Family history of bladder cancer   . Family history of colon cancer   . Family history of leukemia   . Lumbar radiculopathy 12/08/2018  . Myofascial pain 12/08/2018  . Impaired gait and mobility 12/08/2018  . Anaphylactic syndrome  12/29/2016  . Chronic nonallergic rhinitis 12/29/2016  . Mild persistent asthma, uncomplicated 36/07/7701  . 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 Gastroesophageal reflux disease 12/29/2016  . Cervical spondylosis with myelopathy and radiculopathy 11/14/2013  . Essential tremor 06/13/2013  . Cervical spondylosis without myelopathy 06/13/2013  . Breast cancer of lower-outer quadrant of left female breast ([place]) 09/10/2010     OCCUPATIONAL THERAPY DISCHARGE SUMMARY  Visits from Start of Care: 9  Current functional level related to goals / functional outcomes: See above    Remaining deficits: decr coordination (improved), decr ROM (improved), decr strength, decr balance for ADLs, decr posture, tremor, bradykinesia, rigidity  Education / Equipment: Education provided/reviewed:  Updated HEP, strategies for ADLs/IADLs.  Pt verbalized understanding of all education provided.  Plan: Patient agrees to discharge.  Patient goals were met. Patient is being discharged due to meeting the stated rehab goals.  Pt met 4/4 STGs and 3/4 LTGs (remaiing LTG inconsistent/pt needs cueing). Pt would benefit from re-evaluation/occupational therapy screen in approx 6 months to assess for need for further therapy/functional changes due to progressive nature of diagnosis.  ?????        [facility] 03/23/2019, 3:46 PM  [facility] [address] [place], [place], [address] Phone: [phone]   Fax:  [phone]  Name: CATTALEYA WIEN MRN: [id number] Date of Birth: 07/05/1940   Vianne Bulls, OTR/L [facility] [address] [place], [place]  [address] [phone] phone [phone] 03/23/19 3:46 PM

## 2019-03-23 NOTE — Therapy (Signed)
[facility] [address] [place], [place], [address] Phone: [phone]   Fax:  [phone]  Physical Therapy Treatment  Patient Details  Name: April Church MRN: [id number] Date of Birth: 1940-07-13 Referring Provider (PT): Dagoberto Ligas, [place]   Encounter Date: 03/23/2019  PT End of Session - 03/23/19 1103    Visit Number  11    Number of Visits  15    Date for PT Re-Evaluation  05/08/19    Authorization Type  Medicare/BCBS-Will need 10th visit progress note    PT Start Time  1101    PT Stop Time  1145    PT Time Calculation (min)  44 min    Equipment Utilized During Treatment  Gait belt    Activity Tolerance  Patient tolerated treatment well    Behavior During Therapy  [facility] for tasks assessed/performed       Past Medical History:  Diagnosis Date  . Asthma    daily inhaler  . Breast cancer ([place])    left- radiation and surgery -dx. 2011- no further tx. now- [name] , [name]  . Cyst of finger 11/2011   annular cyst right long finger  . Dental crowns present   . Dermatitis   . Family history of bladder cancer   . Family history of colon cancer   . Family history of leukemia   . Family history of ovarian cancer   . Frequency of urination   . GERD (gastroesophageal reflux disease)   . Hemorrhoid   . Hyperlipidemia   . Hypertension    under control, has been on med. x 2 yrs.  [name] PONV (postoperative nausea and vomiting)   . Seasonal allergies   . Tremors of nervous system    hands  . Trigger finger of right hand 11/2011   long finger    Past Surgical History:  Procedure Laterality Date  . ANTERIOR CERVICAL DECOMPRESSION/DISCECTOMY FUSION 4 LEVELS N/A 11/14/2013   Procedure: ANTERIOR CERVICAL DECOMPRESSION/DISCECTOMY FUSION 4 LEVELS;  Surgeon: Newman Pies, MD;  Location: [place] [facility];  Service: Neurosurgery;  Laterality: N/A;  C34 C45 C56 C67 anterior cervical fusion with interbody prosthesis  plating and  bonegraft  . APPENDECTOMY  age 16  . BREAST LUMPECTOMY  06/16/2009   left; SLN bx.  [name] BREAST LUMPECTOMY  07/01/2009   re-excision  . BREAST SURGERY  1999   reduction  . CATARACT EXTRACTION, BILATERAL    . COLONOSCOPY WITH PROPOFOL N/A 12/03/2014   Procedure: COLONOSCOPY WITH PROPOFOL;  Surgeon: [name], MD;  Location: [facility];  Service: Endoscopy;  Laterality: N/A;  . FOOT SURGERY  06/22/11   left  . KNEE ARTHROSCOPY  03/12/2005   right  . KNEE ARTHROSCOPY     left  . NASAL SINUS SURGERY     x 2  . 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 TRIGGER FINGER RELEASE  12/16/2011   Procedure: RELEASE TRIGGER FINGER/A-1 PULLEY;  Surgeon: Tennis Must, MD;  Location: [facility];  Service: Orthopedics;  Laterality: Right;  RIGHT LONG FINGER TRIGGER RELEASE & ANNULAR CYST EXCISION  . TUMOR EXCISION  age 80   right arm    There were no vitals filed for this visit.  Subjective Assessment - 03/23/19 1102    Subjective  Having knees injected on Monday. No falls to report.    Pertinent History  Hx of Parkinson's disease; donated kidney to husband; hx  of breast cancer    How long can you walk comfortably?  [place] and [place]; and that's the most she can do before leg pain limits    Diagnostic tests  MRI 09/2018, shows multi leve disc bulge lumbar spine    Patient Stated Goals  Pt's goal for therapy is to be more sure of balance and have less pain.  Additional goals 03/10/2019:  getting out of car better/more sure of walking with initiating gait    Currently in Pain?  Yes    Pain Score  4     Pain Location  Leg    Pain Orientation  Right;Left    Pain Descriptors / Indicators  Aching    Pain Type  Acute pain;Chronic pain    Pain Radiating Towards  back of knees/legs    Pain Onset  More than a month ago    Pain Frequency  Intermittent    Aggravating Factors   sitting>standing, walking     Pain Relieving Factors  rest             [facility] Adult PT Treatment/Exercise - 03/23/19 1104      Transfers   Transfers  Sit to Stand;Stand to Sit    Sit to Stand  5: Supervision;Without upper extremity assist;From chair/3-in-1    Stand to Sit  5: Supervision;Without upper extremity assist;To chair/3-in-1      Ambulation/Gait   Ambulation/Gait  Yes    Ambulation/Gait Assistance  5: Supervision;4: Min guard    Ambulation/Gait Assistance Details  gait around track working on speed changes, sudden stops/starts, scanning enviroment in all directions with cues for maintaining pace/speed with scanning, cues for arm swing with all gait.     Ambulation Distance (Feet)  500 Feet   x1   Assistive device  None    Gait Pattern  Step-through pattern;Decreased arm swing - right;Decreased step length - right;Narrow base of support    Ambulation Surface  Level;Indoor      High Level Balance   High Level Balance Activities  Marching forwards;Marching backwards;Head turns;Tandem walking;Negotiating over obstacles;Other (comment)   tandem and toe walk fwd/bwd   High Level Balance Comments  on red/blue mats next to counter top with intermittent UE touch to counter:  3 laps each of dynamic gait activities with intermittent touch to counter for balance; 4 small hurdles on red mat- reciprocal fwd stepping over the hurdles for 6 laps total with min guard to min assist for balance with single UE support at times at the counter; side stepping over the hurdles for 4 laps each way with cues on step length and weight shifting. min guard to min assist with occasional UE support on counter for balance       Neuro Re-ed    Neuro Re-ed Details   on red mat next to blue mat: performed standing PWR! moves exercies for PWR! UP with cues on wider stance and ampitude with movements, PWR! step with cues needed on form/increased step, PWR! twist with cues to pause between rotations with "open chest" and for increased  ampitude with twist, PWR! step                PT Short Term Goals - 03/10/19 1016      PT SHORT TERM GOAL #1   Baseline  --        PT Long Term Goals - 03/10/19 1018      PT LONG TERM GOAL #  1   Title  Pt will be independent with progression of HEP for decreased pain, improved mobility, strength, balance.  TARGET for all LTGS: 04/06/2019    Time  4    Period  Weeks    Status  On-going      PT LONG TERM GOAL #2   Title  FGA score to improve to 23/30 to decrease fall risk.    Baseline  21/30 02/27/2019    Time  4    Period  Weeks    Status  On-going      PT LONG TERM GOAL #3   Title  Pt will improve 5x sit<>stand to less than or equal to 15 seconds for improved functional lower extremity strength and improved car transfers.    Baseline  >17 sec 03/10/2019    Time  4    Period  Weeks    Status  On-going      PT LONG TERM GOAL #4   Title  Pt will report at least 50% improvement in car transfers.    Time  4    Period  Weeks    Status  New      PT LONG TERM GOAL #5   Title  Pt will verbalize plans for continued community fitness upon d/c from PT.    Time  4    Period  Weeks    Status  New            Plan - 03/23/19 1104    Clinical Impression Statement  Today's skilled session continued to focus on dynamic gait, strengthening and balance reactions with rest breaks taken due to fatigue and to allow pt's HR to return to resting rate. The pt is progressing toward goals and should benefit from continued PT to progress toward unmet goals.    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 transplant    Examination-Activity Limitations  Bed Mobility;Transfers;Sit;Stand;Locomotion Level    Examination-Participation Restrictions  Meal Prep;Community Activity   Walking in neighborhood   Stability/Clinical Decision Making  Evolving/Moderate complexity    Rehab Potential  Good    PT  Frequency  2x / week    PT Duration  4 weeks   Per recert [id number]   PT Treatment/Interventions  ADLs/Self Care Home Management;Electrical Stimulation;Ultrasound;Traction;Moist Heat;Gait training;Functional mobility training;Therapeutic activities;Balance training;Therapeutic exercise;Neuromuscular re-education;Patient/family education;Aquatic Therapy    PT Next Visit Plan  Work on dynamic balance exercises (head motions, forward/back walking, obstacles), compliant surfaces; standing PWR! Moves-on compliant surfaces; functional lower extremity strenghtening (sit<>stand from progressively lower surfaces simulating car transfer), step ups; seated/standing PWR! Moves for posture, weightshifting, trunk flexibility, initiating of stepping    Consulted and Agree with Plan of Care  Patient       Patient will benefit from skilled therapeutic intervention in order to improve the following deficits and impairments:  Abnormal gait, Decreased mobility, Decreased balance, Difficulty walking, Decreased strength, Postural dysfunction, Impaired flexibility  Visit Diagnosis: Other abnormalities of gait and mobility  Muscle weakness (generalized)  Unsteadiness on feet  Other symptoms and signs involving the musculoskeletal system     Problem List Patient Active Problem List   Diagnosis Date Noted  . Bilateral calf pain 03/05/2019  . Genetic testing 02/13/2019  . Family history of ovarian cancer   . Family history of bladder cancer   . Family history of colon cancer   . Family history of leukemia   .  Lumbar radiculopathy 12/08/2018  . Myofascial pain 12/08/2018  . Impaired gait and mobility 12/08/2018  . 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 Chronic nonallergic rhinitis 12/29/2016  . Mild persistent asthma, uncomplicated [id number]  . 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 Gastroesophageal reflux disease 12/29/2016  . Cervical spondylosis with myelopathy and radiculopathy 11/14/2013  .  Essential tremor 06/13/2013  . Cervical spondylosis without myelopathy 06/13/2013  . Breast cancer of lower-outer quadrant of left female breast ([place]) 09/10/2010    Willow Ora, PTA, [facility] [address], [place], [place] [address] [phone] 03/23/19, 10:50 PM   Name: April Church MRN: [id number] Date of Birth: 07/11/1940

## 2019-03-26 ENCOUNTER — Encounter
Payer: Medicare Other | Attending: Physical Medicine and Rehabilitation | Admitting: Physical Medicine and Rehabilitation

## 2019-03-26 ENCOUNTER — Other Ambulatory Visit: Payer: Self-pay

## 2019-03-26 ENCOUNTER — Encounter: Payer: Self-pay | Admitting: Physical Medicine and Rehabilitation

## 2019-03-26 VITALS — BP 123/82 | HR 100 | Temp 97.5°F | Ht 61.0 in | Wt 123.6 lb

## 2019-03-26 DIAGNOSIS — M170 Bilateral primary osteoarthritis of knee: Secondary | ICD-10-CM | POA: Diagnosis not present

## 2019-03-26 DIAGNOSIS — M7918 Myalgia, other site: Secondary | ICD-10-CM | POA: Insufficient documentation

## 2019-03-26 DIAGNOSIS — R2689 Other abnormalities of gait and mobility: Secondary | ICD-10-CM | POA: Insufficient documentation

## 2019-03-26 DIAGNOSIS — M79662 Pain in left lower leg: Secondary | ICD-10-CM | POA: Diagnosis not present

## 2019-03-26 DIAGNOSIS — M79661 Pain in right lower leg: Secondary | ICD-10-CM

## 2019-03-26 DIAGNOSIS — M5416 Radiculopathy, lumbar region: Secondary | ICD-10-CM | POA: Diagnosis not present

## 2019-03-26 NOTE — Progress Notes (Signed)
Injection in back by Dr Letta Pate was helpful for back pain.   Flexeril helps to sleep- more comfortable-     Was told if PT is OK- after knee injections. Still having calf tightness B/L- esp if gets out of a chair or out of car.- Has to hold on, will feel like will fall.  Working on these issues at PT. PT helping some.   Asking for B/L knee injections today.   Exam: Awake, alert, appropriate, no assistive device, NAD Mild effusion B/L knees; TTP over distal patella B/L     Plan: 1. steroid injection was performed at B/L knee steroid injections B/L using 1% plain Lidocaine and 40mg  /1cc of Kenalog. This was well tolerated.  Cleaned with betadine x3 and allowed to dry- then alcohol then injected using 27 gauge 1.5 inch needle- no bleeding or complications.    F/U in 3 months for steroid injections of shoulders/biceps tendons.  Lidocaine will kick in 15 minutes- and wear off tonight- the steroid will kick in tomorrow within 24 hours and take up to 72 hours to fully kick in.  2. Will see how things are going and have pt f/u with Dr Letta Pate in future.  3. Has 2 more refills on Flexeril 5 mg QHS.    4. F/U in 3 months or earlier if need be.

## 2019-03-26 NOTE — Patient Instructions (Signed)
F/U in 3 months for steroid injections of shoulders/biceps tendons.  Lidocaine will kick in 15 minutes- and wear off tonight- the steroid will kick in tomorrow within 24 hours and take up to 72 hours to fully kick in.

## 2019-03-27 ENCOUNTER — Encounter: Payer: Self-pay | Admitting: Physical Therapy

## 2019-03-27 ENCOUNTER — Ambulatory Visit: Payer: Medicare Other | Attending: Internal Medicine | Admitting: Physical Therapy

## 2019-03-27 ENCOUNTER — Other Ambulatory Visit: Payer: Self-pay

## 2019-03-27 DIAGNOSIS — R2689 Other abnormalities of gait and mobility: Secondary | ICD-10-CM

## 2019-03-27 DIAGNOSIS — R2681 Unsteadiness on feet: Secondary | ICD-10-CM | POA: Diagnosis not present

## 2019-03-27 DIAGNOSIS — M6281 Muscle weakness (generalized): Secondary | ICD-10-CM | POA: Diagnosis not present

## 2019-03-27 DIAGNOSIS — R293 Abnormal posture: Secondary | ICD-10-CM

## 2019-03-27 NOTE — Therapy (Signed)
[facility] [address] [place], [place], [address] Phone: [phone]   Fax:  [phone]  Physical Therapy Treatment  Patient Details  Name: April Church MRN: [id number] Date of Birth: 03/04/1940 Referring Provider (PT): Dagoberto Ligas, [place]   Encounter Date: 03/27/2019  PT End of Session - 03/27/19 1104    Visit Number  12    Number of Visits  15    Date for PT Re-Evaluation  05/08/19    Authorization Type  Medicare/BCBS-Will need 10th visit progress note    PT Start Time  1102    PT Stop Time  1144    PT Time Calculation (min)  42 min    Equipment Utilized During Treatment  Gait belt    Activity Tolerance  Patient tolerated treatment well    Behavior During Therapy  [facility] for tasks assessed/performed       Past Medical History:  Diagnosis Date  . Asthma    daily inhaler  . Breast cancer ([place])    left- radiation and surgery -dx. 2011- no further tx. now- [name] , [name]  . Cyst of finger 11/2011   annular cyst right long finger  . Dental crowns present   . Dermatitis   . Family history of bladder cancer   . Family history of colon cancer   . Family history of leukemia   . Family history of ovarian cancer   . Frequency of urination   . GERD (gastroesophageal reflux disease)   . Hemorrhoid   . Hyperlipidemia   . Hypertension    under control, has been on med. x 2 yrs.  [name] PONV (postoperative nausea and vomiting)   . Seasonal allergies   . Tremors of nervous system    hands  . Trigger finger of right hand 11/2011   long finger    Past Surgical History:  Procedure Laterality Date  . ANTERIOR CERVICAL DECOMPRESSION/DISCECTOMY FUSION 4 LEVELS N/A 11/14/2013   Procedure: ANTERIOR CERVICAL DECOMPRESSION/DISCECTOMY FUSION 4 LEVELS;  Surgeon: Newman Pies, MD;  Location: [place];  Service: Neurosurgery;  Laterality: N/A;  C34 C45 C56 C67 anterior cervical fusion with interbody prosthesis  plating and  bonegraft  . APPENDECTOMY  age 2  . BREAST LUMPECTOMY  06/16/2009   left; SLN bx.  [name] BREAST LUMPECTOMY  07/01/2009   re-excision  . BREAST SURGERY  1999   reduction  . CATARACT EXTRACTION, BILATERAL    . COLONOSCOPY WITH PROPOFOL N/A 12/03/2014   Procedure: COLONOSCOPY WITH PROPOFOL;  Surgeon: [name], MD;  [facility];  Service: Endoscopy;  Laterality: N/A;  . FOOT SURGERY  06/22/11   left  . KNEE ARTHROSCOPY  03/12/2005   right  . KNEE ARTHROSCOPY     left  . NASAL SINUS SURGERY     x 2  . 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 TRIGGER FINGER RELEASE  12/16/2011   Procedure: RELEASE TRIGGER FINGER/A-1 PULLEY;  Surgeon: Tennis Must, MD;  Location: [facility];  Service: Orthopedics;  Laterality: Right;  RIGHT LONG FINGER TRIGGER RELEASE & ANNULAR CYST EXCISION  . TUMOR EXCISION  age 77   right arm    There were no vitals filed for this visit.  Subjective Assessment - 03/27/19 1104    Subjective  Had her injections yesterday to her knees and reports "they feel great today". No other compaints.    Pertinent History  Hx of  Parkinson's disease; donated kidney to husband; hx of breast cancer    How long can you walk comfortably?  [place] and back; and that's the most she can do before leg pain limits    Diagnostic tests  MRI 09/2018, shows multi leve disc bulge lumbar spine    Patient Stated Goals  Pt's goal for therapy is to be more sure of balance and have less pain.  Additional goals 03/10/2019:  getting out of car better/more sure of walking with initiating gait    Currently in Pain?  No/denies    Pain Score  0-No pain            [facility] Adult PT Treatment/Exercise - 03/27/19 1106      Ambulation/Gait   Ambulation/Gait  Yes    Ambulation/Gait Assistance  5: Supervision;4: Min guard    Ambulation/Gait Assistance Details  working on fast walk/slow walk,  scanning all directions, sudden stops/starts, quick change to backward walking/forward walking with min guard to supervision for balance. reminder cues for reciprocal arm swing at times needed as well.     Assistive device  None    Gait Pattern  Step-through pattern;Decreased arm swing - right;Decreased step length - right;Narrow base of support    Ambulation Surface  Level;Indoor      Neuro Re-ed    Neuro Re-ed Details   for strengthening/muscle re-ed: standing with back to corner- cross body reaching with trunk rotation to targets on wall- x 10 reps to targets high on wall, then progressed to on pillows for 10 reps each side with target just above shoulder level, progressing to highter target for 10 reps each side. min guard to min assist for balance with ocassional touch to walls/chair for balance.       Knee/Hip Exercises: Aerobic   Other Aerobic  Scifit Level 2.5 with UE/LE for 8 minutes with goal >/= 35 rpm for strengthening and activity tolerance.       Performed seated PWR! Moves in session as follows: PWR! Up for 10 reps with emphasis on arm position when rising and velocity of movements. PWR! Rock for 10 reps each side with cues on technique, to look at hand with arm rising and for higher velocity movements. PWR! Twist for 10 reps each side with cues to "reset" between each rep for "loud" claps each time. PWR! Step for 10 reps each side with use of black foam bolsters to promote increased step height, cues for "power" with stepping to to make a stomp sound and weight shifting.         PT Short Term Goals - 03/10/19 1016      PT SHORT TERM GOAL #1   Baseline  --        PT Long Term Goals - 03/10/19 1018      PT LONG TERM GOAL #1   Title  Pt will be independent with progression of HEP for decreased pain, improved mobility, strength, balance.  TARGET for all LTGS: 04/06/2019    Time  4    Period  Weeks    Status  On-going      PT LONG TERM GOAL #2   Title  FGA score to  improve to 23/30 to decrease fall risk.    Baseline  21/30 02/27/2019    Time  4    Period  Weeks    Status  On-going      PT LONG TERM GOAL #3  Title  Pt will improve 5x sit<>stand to less than or equal to 15 seconds for improved functional lower extremity strength and improved car transfers.    Baseline  >17 sec 03/10/2019    Time  4    Period  Weeks    Status  On-going      PT LONG TERM GOAL #4   Title  Pt will report at least 50% improvement in car transfers.    Time  4    Period  Weeks    Status  New      PT LONG TERM GOAL #5   Title  Pt will verbalize plans for continued community fitness upon d/c from PT.    Time  4    Period  Weeks    Status  New            Plan - 03/27/19 1105    Clinical Impression Statement  Today's skilled session continued to focus on strengthening, activity tolerance and balance. Rest breaks taken due to fatigue with minimal soreness in left knee/LE reported at times in session that resolved with rest breaks. The pt is progressing toward goals and should benefit from continued PT to progress toward unmet goals.    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 transplant    Examination-Activity Limitations  Bed Mobility;Transfers;Sit;Stand;Locomotion Level    Examination-Participation Restrictions  Meal Prep;Community Activity   Walking in neighborhood   Stability/Clinical Decision Making  Evolving/Moderate complexity    Rehab Potential  Good    PT Frequency  2x / week    PT Duration  4 weeks   Per recert [id number]   PT Treatment/Interventions  ADLs/Self Care Home Management;Electrical Stimulation;Ultrasound;Traction;Moist Heat;Gait training;Functional mobility training;Therapeutic activities;Balance training;Therapeutic exercise;Neuromuscular re-education;Patient/family education;Aquatic Therapy    PT Next Visit Plan  Work on dynamic balance exercises (head  motions, forward/back walking, obstacles), compliant surfaces; standing PWR! Moves-on compliant surfaces; functional lower extremity strenghtening (sit<>stand from progressively lower surfaces simulating car transfer), step ups; seated/standing PWR! Moves for posture, weightshifting, trunk flexibility, initiating of stepping    Consulted and Agree with Plan of Care  Patient       Patient will benefit from skilled therapeutic intervention in order to improve the following deficits and impairments:  Abnormal gait, Decreased mobility, Decreased balance, Difficulty walking, Decreased strength, Postural dysfunction, Impaired flexibility  Visit Diagnosis: Muscle weakness (generalized)  Other abnormalities of gait and mobility  Unsteadiness on feet  Abnormal posture     Problem List Patient Active Problem List   Diagnosis Date Noted  . Bilateral calf pain 03/05/2019  . Genetic testing 02/13/2019  . Family history of ovarian cancer   . Family history of bladder cancer   . Family history of colon cancer   . Family history of leukemia   . Lumbar radiculopathy 12/08/2018  . Myofascial pain 12/08/2018  . Impaired gait and mobility 12/08/2018  . 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 Chronic nonallergic rhinitis 12/29/2016  . Mild persistent asthma, uncomplicated [id number]  . 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 Gastroesophageal reflux disease 12/29/2016  . Cervical spondylosis with myelopathy and radiculopathy 11/14/2013  . Essential tremor 06/13/2013  . Cervical spondylosis without myelopathy 06/13/2013  . Breast cancer of lower-outer quadrant of left female breast ([place]) 09/10/2010    Willow Ora, PTA, [facility] [address] [place], [place] [address] [phone] 03/27/19, 12:27 PM   Name:  April Church MRN: [id number] Date of Birth: Nov 02, 1940

## 2019-03-30 ENCOUNTER — Ambulatory Visit: Payer: Medicare Other | Admitting: Physical Therapy

## 2019-04-03 ENCOUNTER — Other Ambulatory Visit: Payer: Self-pay

## 2019-04-03 ENCOUNTER — Ambulatory Visit: Payer: Medicare Other | Admitting: Physical Therapy

## 2019-04-03 DIAGNOSIS — R2681 Unsteadiness on feet: Secondary | ICD-10-CM

## 2019-04-03 DIAGNOSIS — R2689 Other abnormalities of gait and mobility: Secondary | ICD-10-CM

## 2019-04-03 DIAGNOSIS — R293 Abnormal posture: Secondary | ICD-10-CM | POA: Diagnosis not present

## 2019-04-03 DIAGNOSIS — M6281 Muscle weakness (generalized): Secondary | ICD-10-CM | POA: Diagnosis not present

## 2019-04-03 NOTE — Therapy (Signed)
[facility] [address] [place], [place], [address] Phone: [phone]   Fax:  [phone]  Physical Therapy Treatment/Discharge Summary  Patient Details  Name: April Church MRN: [id number] Date of Birth: 1940-07-30 Referring Provider (PT): Dagoberto Ligas, [place]   Encounter Date: 04/03/2019  PT End of Session - 04/03/19 1949    Visit Number  13    Number of Visits  15    Date for PT Re-Evaluation  05/08/19    Authorization Type  Medicare/BCBS-Will need 10th visit progress note    PT Start Time  1102    PT Stop Time  1144    PT Time Calculation (min)  42 min    Equipment Utilized During Treatment  Gait belt    Activity Tolerance  Patient tolerated treatment well    Behavior During Therapy  [facility] for tasks assessed/performed       Past Medical History:  Diagnosis Date  . Asthma    daily inhaler  . Breast cancer ([place])    left- radiation and surgery -dx. 2011- no further tx. now- [name] , [name]  . Cyst of finger 11/2011   annular cyst right long finger  . Dental crowns present   . Dermatitis   . Family history of bladder cancer   . Family history of colon cancer   . Family history of leukemia   . Family history of ovarian cancer   . Frequency of urination   . GERD (gastroesophageal reflux disease)   . Hemorrhoid   . Hyperlipidemia   . Hypertension    under control, has been on med. x 2 yrs.  [name] PONV (postoperative nausea and vomiting)   . Seasonal allergies   . Tremors of nervous system    hands  . Trigger finger of right hand 11/2011   long finger    Past Surgical History:  Procedure Laterality Date  . ANTERIOR CERVICAL DECOMPRESSION/DISCECTOMY FUSION 4 LEVELS N/A 11/14/2013   Procedure: ANTERIOR CERVICAL DECOMPRESSION/DISCECTOMY FUSION 4 LEVELS;  Surgeon: Newman Pies, MD;  Location: [place] [facility];  Service: Neurosurgery;  Laterality: N/A;  C34 C45 C56 C67 anterior cervical fusion with  interbody prosthesis plating and  bonegraft  . APPENDECTOMY  age 65  . BREAST LUMPECTOMY  06/16/2009   left; SLN bx.  [name] BREAST LUMPECTOMY  07/01/2009   re-excision  . BREAST SURGERY  1999   reduction  . CATARACT EXTRACTION, BILATERAL    . COLONOSCOPY WITH PROPOFOL N/A 12/03/2014   Procedure: COLONOSCOPY WITH PROPOFOL;  Surgeon: [name], MD;  Location: [facility];  Service: Endoscopy;  Laterality: N/A;  . FOOT SURGERY  06/22/11   left  . KNEE ARTHROSCOPY  03/12/2005   right  . KNEE ARTHROSCOPY     left  . NASAL SINUS SURGERY     x 2  . 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 TRIGGER FINGER RELEASE  12/16/2011   Procedure: RELEASE TRIGGER FINGER/A-1 PULLEY;  Surgeon: Tennis Must, MD;  Location: [facility];  Service: Orthopedics;  Laterality: Right;  RIGHT LONG FINGER TRIGGER RELEASE & ANNULAR CYST EXCISION  . TUMOR EXCISION  age 30   right arm    There were no vitals filed for this visit.  Subjective Assessment - 04/03/19 1106    Subjective  My legs feel so much better.  To get my second COVID shot tomorrow.    Pertinent History  Hx of Parkinson's  disease; donated kidney to husband; hx of breast cancer    How long can you walk comfortably?  [place] and back; and that's the most she can do before leg pain limits    Diagnostic tests  MRI 09/2018, shows multi leve disc bulge lumbar spine    Patient Stated Goals  Pt's goal for therapy is to be more sure of balance and have less pain.  Additional goals 03/10/2019:  getting out of car better/more sure of walking with initiating gait    Currently in Pain?  No/denies         [facility] PT Assessment - 04/03/19 0001      Functional Gait  Assessment   Gait assessed   Yes    Gait Level Surface  Walks 20 ft in less than 7 sec but greater than 5.5 sec, uses assistive device, slower speed, mild gait deviations, or deviates 6-10 in outside of the 12  in walkway width.    Change in Gait Speed  Able to smoothly change walking speed without loss of balance or gait deviation. Deviate no more than 6 in outside of the 12 in walkway width.    Gait with Horizontal Head Turns  Performs head turns smoothly with slight change in gait velocity (eg, minor disruption to smooth gait path), deviates 6-10 in outside 12 in walkway width, or uses an assistive device.    Gait with Vertical Head Turns  Performs head turns with no change in gait. Deviates no more than 6 in outside 12 in walkway width.    Gait and Pivot Turn  Pivot turns safely within 3 sec and stops quickly with no loss of balance.    Step Over Obstacle  Is able to step over one shoe box (4.5 in total height) without changing gait speed. No evidence of imbalance.    Gait with Narrow Base of Support  Ambulates 4-7 steps.    Gait with Eyes Closed  Walks 20 ft, slow speed, abnormal gait pattern, evidence for imbalance, deviates 10-15 in outside 12 in walkway width. Requires more than 9 sec to ambulate 20 ft.    Ambulating Backwards  Walks 20 ft, uses assistive device, slower speed, mild gait deviations, deviates 6-10 in outside 12 in walkway width.    Steps  Alternating feet, must use rail.    Total Score  21    FGA comment:  unchanged from last check 03/09/2019                   [facility] Adult PT Treatment/Exercise - 04/03/19 0001      Transfers   Transfers  Sit to Stand;Stand to Sit    Sit to Stand  5: Supervision;Without upper extremity assist;From chair/3-in-1    Five time sit to stand comments   17.91    Stand to Sit  5: Supervision;Without upper extremity assist;To chair/3-in-1      Ambulation/Gait   Ambulation/Gait  Yes    Ambulation/Gait Assistance  5: Supervision;6: Modified independent (Device/Increase time)    Ambulation Distance (Feet)  400 Feet   345   Assistive device  None    Gait Pattern  Step-through pattern;Decreased arm swing - right;Decreased step length -  right;Narrow base of support    Ambulation Surface  Level;Indoor    Gait velocity  9.28 sec = 3.53 ft/sec      Standardized Balance Assessment   Standardized Balance Assessment  Timed Up and Go Test  Timed Up and Go Test   TUG  Normal TUG    Normal TUG (seconds)  11.94      Self-Care   Self-Care  Other Self-Care Comments    Other Self-Care Comments   Discussed progress towards goals, POC and plans for d/c this visit.  Pt in agreement.  Discussed ongoing fitness opportunities, with pt verbalizing continued performance of PWR! Moves, which she does daily and walking in neighborhood (as weather permits).  Discussed return PD eval vs screen in 6-9 months.         Neuro Re-education:  With pt standing on red mat surface:    Pt performs PWR! Moves in standing position x 10 reps   PWR! Up for improved posture  PWR! Rock for improved weighshifting-cues to incorporate head turns to focus on hands  PWR! Twist for improved trunk rotation -cues to incorporate head turns to focus on hands; pt has 2 posterior LOB and is able to regain  PWR! Step for improved step initiation -cues to incorporate head turns to focus on hands each side     Cues provided for technique and to incorporate head turns  Verbally reviewed seated PWR! Moves and stretches that pt verbalizes performing as part of HEP.       PT Education - 04/03/19 1948    Education Details  Progress towards goals, POC, plan for d/c this visit    Person(s) Educated  Patient    Methods  Explanation    Comprehension  Verbalized understanding       PT Short Term Goals - 03/10/19 1016      PT SHORT TERM GOAL #1   Baseline  --        PT Long Term Goals - 04/03/19 1950      PT LONG TERM GOAL #1   Title  Pt will be independent with progression of HEP for decreased pain, improved mobility, strength, balance.  TARGET for all LTGS: 04/06/2019    Time  4    Period  Weeks    Status  Achieved      PT LONG TERM GOAL #2    Title  FGA score to improve to 23/30 to decrease fall risk.    Baseline  21/30 02/27/2019    Time  4    Period  Weeks    Status  Not Met      PT LONG TERM GOAL #3   Title  Pt will improve 5x sit<>stand to less than or equal to 15 seconds for improved functional lower extremity strength and improved car transfers.    Baseline  >17 sec 03/10/2019    Time  4    Period  Weeks    Status  Not Met      PT LONG TERM GOAL #4   Title  Pt will report at least 50% improvement in car transfers.    Baseline  Reports improving at least 50% in car transfers    Time  4    Period  Weeks    Status  Achieved      PT LONG TERM GOAL #5   Title  Pt will verbalize plans for continued community fitness upon d/c from PT.    Baseline  Walking daily, HEP-PWR! Moves daily    Time  4    Period  Weeks    Status  Achieved            Plan - 04/03/19 1950    Clinical Impression Statement  Assessed LTGs this visit, with pt meeting LTG 1, 4, and 5.  LTG 2 and 3 not met for FGA or 5x sit<>stand.  Overall, pt has less c/o back pain and less c/o lower extremity pain following injections.  She continues to have LOB at times with head motions and on compliant surfaces.  In brief discussion regarding cane, she declines to use at this time.  Pt has HEP and walking program for home.  She is appropriate for d/c from PT at this time.    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 transplant    Examination-Activity Limitations  Bed Mobility;Transfers;Sit;Stand;Locomotion Level    Examination-Participation Restrictions  Meal Prep;Community Activity   Walking in neighborhood   Stability/Clinical Decision Making  Evolving/Moderate complexity    Rehab Potential  Good    PT Frequency  2x / week    PT Duration  4 weeks   Per recert 04/29/6576   PT Treatment/Interventions  ADLs/Self Care Home Management;Electrical  Stimulation;Ultrasound;Traction;Moist Heat;Gait training;Functional mobility training;Therapeutic activities;Balance training;Therapeutic exercise;Neuromuscular re-education;Patient/family education;Aquatic Therapy    PT Next Visit Plan  D/C this visit; plan for return eval in 6-9 months.    Consulted and Agree with Plan of Care  Patient       Patient will benefit from skilled therapeutic intervention in order to improve the following deficits and impairments:  Abnormal gait, Decreased mobility, Decreased balance, Difficulty walking, Decreased strength, Postural dysfunction, Impaired flexibility  Visit Diagnosis: Unsteadiness on feet  Other abnormalities of gait and mobility     Problem List Patient Active Problem List   Diagnosis Date Noted  . Bilateral calf pain 03/05/2019  . Genetic testing 02/13/2019  . Family history of ovarian cancer   . Family history of bladder cancer   . Family history of colon cancer   . Family history of leukemia   . Lumbar radiculopathy 12/08/2018  . Myofascial pain 12/08/2018  . Impaired gait and mobility 12/08/2018  . 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 Chronic nonallergic rhinitis 12/29/2016  . Mild persistent asthma, uncomplicated 46/96/2952  . 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 Gastroesophageal reflux disease 12/29/2016  . Cervical spondylosis with myelopathy and radiculopathy 11/14/2013  . Essential tremor 06/13/2013  . Cervical spondylosis without myelopathy 06/13/2013  . Breast cancer of lower-outer quadrant of left female breast ([place]) 09/10/2010    , W. 04/03/2019, 7:54 PM Frazier Butt., PT  [facility] [address] [place], [place], [address] Phone: [phone]   Fax:  [phone]  Name: KIRSTON LUTY MRN: [id number] Date of Birth: [birth year]

## 2019-04-06 ENCOUNTER — Ambulatory Visit: Payer: Medicare Other | Admitting: Physical Therapy

## 2019-05-09 DIAGNOSIS — Z13820 Encounter for screening for osteoporosis: Secondary | ICD-10-CM | POA: Diagnosis not present

## 2019-05-23 DIAGNOSIS — J45909 Unspecified asthma, uncomplicated: Secondary | ICD-10-CM | POA: Diagnosis not present

## 2019-05-23 DIAGNOSIS — F329 Major depressive disorder, single episode, unspecified: Secondary | ICD-10-CM | POA: Diagnosis not present

## 2019-05-23 DIAGNOSIS — E785 Hyperlipidemia, unspecified: Secondary | ICD-10-CM | POA: Diagnosis not present

## 2019-05-23 DIAGNOSIS — I10 Essential (primary) hypertension: Secondary | ICD-10-CM | POA: Diagnosis not present

## 2019-05-23 DIAGNOSIS — M4726 Other spondylosis with radiculopathy, lumbar region: Secondary | ICD-10-CM | POA: Diagnosis not present

## 2019-05-23 DIAGNOSIS — C50512 Malignant neoplasm of lower-outer quadrant of left female breast: Secondary | ICD-10-CM | POA: Diagnosis not present

## 2019-06-05 DIAGNOSIS — M4726 Other spondylosis with radiculopathy, lumbar region: Secondary | ICD-10-CM | POA: Diagnosis not present

## 2019-06-05 DIAGNOSIS — G20 Parkinson's disease: Secondary | ICD-10-CM | POA: Diagnosis not present

## 2019-06-05 DIAGNOSIS — R413 Other amnesia: Secondary | ICD-10-CM | POA: Diagnosis not present

## 2019-06-05 DIAGNOSIS — I10 Essential (primary) hypertension: Secondary | ICD-10-CM | POA: Diagnosis not present

## 2019-06-05 DIAGNOSIS — F329 Major depressive disorder, single episode, unspecified: Secondary | ICD-10-CM | POA: Diagnosis not present

## 2019-06-05 DIAGNOSIS — F3341 Major depressive disorder, recurrent, in partial remission: Secondary | ICD-10-CM | POA: Diagnosis not present

## 2019-06-05 DIAGNOSIS — J45909 Unspecified asthma, uncomplicated: Secondary | ICD-10-CM | POA: Diagnosis not present

## 2019-06-05 DIAGNOSIS — C50512 Malignant neoplasm of lower-outer quadrant of left female breast: Secondary | ICD-10-CM | POA: Diagnosis not present

## 2019-06-05 DIAGNOSIS — Z0000 Encounter for general adult medical examination without abnormal findings: Secondary | ICD-10-CM | POA: Diagnosis not present

## 2019-06-05 DIAGNOSIS — E785 Hyperlipidemia, unspecified: Secondary | ICD-10-CM | POA: Diagnosis not present

## 2019-06-05 DIAGNOSIS — G250 Essential tremor: Secondary | ICD-10-CM | POA: Diagnosis not present

## 2019-06-07 DIAGNOSIS — H524 Presbyopia: Secondary | ICD-10-CM | POA: Diagnosis not present

## 2019-06-07 DIAGNOSIS — Z961 Presence of intraocular lens: Secondary | ICD-10-CM | POA: Diagnosis not present

## 2019-06-18 ENCOUNTER — Encounter
Payer: Medicare Other | Attending: Physical Medicine and Rehabilitation | Admitting: Physical Medicine and Rehabilitation

## 2019-06-18 ENCOUNTER — Encounter: Payer: Self-pay | Admitting: Physical Medicine and Rehabilitation

## 2019-06-18 ENCOUNTER — Other Ambulatory Visit: Payer: Self-pay

## 2019-06-18 VITALS — BP 147/88 | HR 91 | Temp 97.7°F | Ht 60.0 in | Wt 123.0 lb

## 2019-06-18 DIAGNOSIS — M25562 Pain in left knee: Secondary | ICD-10-CM | POA: Diagnosis not present

## 2019-06-18 DIAGNOSIS — M5416 Radiculopathy, lumbar region: Secondary | ICD-10-CM | POA: Diagnosis not present

## 2019-06-18 DIAGNOSIS — G8929 Other chronic pain: Secondary | ICD-10-CM

## 2019-06-18 DIAGNOSIS — R2689 Other abnormalities of gait and mobility: Secondary | ICD-10-CM | POA: Insufficient documentation

## 2019-06-18 DIAGNOSIS — M7918 Myalgia, other site: Secondary | ICD-10-CM | POA: Insufficient documentation

## 2019-06-18 DIAGNOSIS — M25561 Pain in right knee: Secondary | ICD-10-CM

## 2019-06-18 MED ORDER — CYCLOBENZAPRINE HCL 5 MG PO TABS: 5.0000 mg | ORAL_TABLET | Freq: Every evening | ORAL | 11 refills | Status: DC | PRN

## 2019-06-18 NOTE — Patient Instructions (Signed)
Patient is a 79 yr old female with 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  1. Make appointment today for back injection. Just to have on the books.   2. steroid injection was performed at B/L knee steroid injections using 1% plain Lidocaine and 40mg  /1cc of Kenalog. This was well tolerated.  Cleaned with betadine x3 and allowed to dry- then alcohol then injected using 27 gauge 1.5 inch needle- no bleeding or complications.    F/U in 3 months for steroid injections of B/L knee injections  Lidocaine will kick in 15 minutes- and wear off tonight- the steroid will kick in tomorrow within 24 hours and take up to 72 hours to fully kick in.  3. F/U in 3+ months.   4. Will renew muscle relaxant as needed- flexeril #50 11 refills

## 2019-06-18 NOTE — Progress Notes (Signed)
Subjective:    Patient ID: April Church, female    DOB: [birth year], 79 y.o.   MRN: [id number]  HPI Patient is a 79 yr old female with 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  Walks to gate and walks back 10-15 minutes- total- beginning to feel it when gets out of chair and when walking distances.    A little burning in low back is coming up; but doesn't think she's ready for back injection yet.    Moved appointment up- from 5/5/- supposed to go on vacation.   [facility] a place at [facility].  Wants to go again.      Pain Inventory Average Pain 5 Pain Right Now 5 My pain is aching  In the last 24 hours, has pain interfered with the following? General activity 4 Relation with others 0 Enjoyment of life 0 What TIME of day is your pain at its worst? daytime Sleep (in general) Good  Pain is worse with: bending Pain improves with: injections Relief from Meds: 0  Mobility walk without assistance how many minutes can you walk? 10-15 ability to climb steps?  yes do you drive?  yes  Function I need assistance with the following:  household duties  Neuro/Psych No problems in this area  Prior Studies Any changes since last visit?  no  Physicians involved in your care Any changes since last visit?  no   Family History  Problem Relation Age of Onset  . Kidney disease Mother   . Stroke Father   . Stroke Sister        08/2015  . Diabetes Brother   . Heart disease Brother   . Ovarian cancer Sister 51  . Heart disease Paternal Grandmother   . Heart disease Paternal Grandfather   . Leukemia Other 15       brother's grandson  . Bladder  Cancer Brother 32  . Colon cancer Niece        dx in late 43s   Social History   Socioeconomic History  . Marital status: Married    Spouse name: Marcello Moores   . Number of children: 1  . Years of education: HS  . Highest education level: Not on file  Occupational History  . Occupation: Retired  Tobacco Use  . Smoking status: Never Smoker  . Smokeless tobacco: Never Used  Substance and Sexual Activity  . Alcohol use: No    Alcohol/week: 0.0 standard drinks  . Drug use: No  . Sexual activity: Yes  Other Topics Concern  . Not on file  Social History Narrative      Pt lives at home with her spouse.  [name]    Patient has 1 child.    Patient is retired.    Patient has a HS education.    Patient is right handed.    Patient drinks caffeine occasionally.              Social Determinants of Health   Financial Resource Strain:   . Difficulty of Paying Living Expenses:   Food Insecurity:   . Worried About Charity fundraiser in the Last Year:   . [employer]  of Food in the Last Year:   Transportation Needs:   . Film/video editor (Medical):   [name] Lack of Transportation (Non-Medical):   Physical Activity:   . Days of Exercise per Week:   . Minutes of Exercise per Session:   Stress:   . Feeling of Stress :   Social Connections:   . Frequency of Communication with Friends and Family:   . Frequency of Social Gatherings with Friends and Family:   . Attends Religious Services:   . Active Member of Clubs or Organizations:   . Attends Archivist Meetings:   [name] Marital Status:    Past Surgical History:  Procedure Laterality Date  . ANTERIOR CERVICAL DECOMPRESSION/DISCECTOMY FUSION 4 LEVELS N/A 11/14/2013   Procedure: ANTERIOR CERVICAL DECOMPRESSION/DISCECTOMY FUSION 4 LEVELS;  Surgeon: Newman Pies, MD;  Location: [place] [facility];  Service: Neurosurgery;  Laterality: N/A;  C34 C45 C56 C67 anterior cervical fusion with interbody prosthesis plating and  bonegraft  .  APPENDECTOMY  age 79  . BREAST LUMPECTOMY  06/16/2009   left; SLN bx.  [name] BREAST LUMPECTOMY  07/01/2009   re-excision  . BREAST SURGERY  1999   reduction  . CATARACT EXTRACTION, BILATERAL    . COLONOSCOPY WITH PROPOFOL N/A 12/03/2014   Procedure: COLONOSCOPY WITH PROPOFOL;  Surgeon: [name], MD;  Location: [facility];  Service: Endoscopy;  Laterality: N/A;  . FOOT SURGERY  06/22/11   left  . KNEE ARTHROSCOPY  03/12/2005   right  . KNEE ARTHROSCOPY     left  . NASAL SINUS SURGERY     x 2  . 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 TRIGGER FINGER RELEASE  12/16/2011   Procedure: RELEASE TRIGGER FINGER/A-1 PULLEY;  Surgeon: Tennis Must, MD;  Location: [place];  Service: Orthopedics;  Laterality: Right;  RIGHT LONG FINGER TRIGGER RELEASE & ANNULAR CYST EXCISION  . TUMOR EXCISION  age 57   right arm   Past Medical History:  Diagnosis Date  . Asthma    daily inhaler  . Breast cancer ([place])    left- radiation and surgery -dx. 2011- no further tx. now- [name] , [name]  . Cyst of finger 11/2011   annular cyst right long finger  . Dental crowns present   . Dermatitis   . Family history of bladder cancer   . Family history of colon cancer   . Family history of leukemia   . Family history of ovarian cancer   . Frequency of urination   . GERD (gastroesophageal reflux disease)   . Hemorrhoid   . Hyperlipidemia   . Hypertension    under control, has been on med. x 2 yrs.  [name] PONV (postoperative nausea and vomiting)   . Seasonal allergies   . Tremors of nervous system    hands  . Trigger finger of right hand 11/2011   long finger   BP (!) 147/88   Pulse 91   Temp 97.7 F (36.5 C)   Ht 5' (1.524 m)   Wt 123 lb (55.8 kg)   SpO2 97%   BMI 24.02 kg/m   Opioid Risk Score:   Fall Risk Score:  `1  Depression screen PHQ 2/9  No flowsheet data found.  Review of Systems  Constitutional: Positive for  diaphoresis and unexpected weight change.  HENT: Negative.   Eyes: Negative.   Respiratory: Negative.   Cardiovascular: Negative.   Gastrointestinal: Negative.  Endocrine: Negative.   Musculoskeletal: Positive for arthralgias and back pain.  Skin: Negative.   Allergic/Immunologic: Negative.   Neurological: Negative.   Psychiatric/Behavioral: Negative.   All other systems reviewed and are negative.      Objective:   Physical Exam  Awake, alert, appropriate, sitting on table, NAD Medial swelling of B/L knees- mild-moderate 1-2+ crepitus B/L TTP over medial and lateral knees B/L      Assessment & Plan:   Patient is a 79 yr old female with 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  1. Make appointment today for back injection. Just to have on the books.   2. steroid injection was performed at B/L knee steroid injections using 1% plain Lidocaine and 40mg  /1cc of Kenalog. This was well tolerated.  Cleaned with betadine x3 and allowed to dry- then alcohol then injected using 27 gauge 1.5 inch needle- no bleeding or complications.    F/U in 3 months for steroid injections of B/L knee injections  Lidocaine will kick in 15 minutes- and wear off tonight- the steroid will kick in tomorrow within 24 hours and take up to 72 hours to fully kick in.  3. F/U in 3+ months.   4. Will renew muscle relaxant as needed- flexeril #50 11 refills

## 2019-06-25 ENCOUNTER — Ambulatory Visit: Payer: Medicare Other | Admitting: Physical Medicine and Rehabilitation

## 2019-07-06 ENCOUNTER — Other Ambulatory Visit: Payer: Self-pay | Admitting: Family Medicine

## 2019-07-12 ENCOUNTER — Other Ambulatory Visit: Payer: Self-pay

## 2019-07-12 ENCOUNTER — Ambulatory Visit (INDEPENDENT_AMBULATORY_CARE_PROVIDER_SITE_OTHER): Payer: Medicare Other | Admitting: Allergy

## 2019-07-12 ENCOUNTER — Encounter: Payer: Self-pay | Admitting: Allergy

## 2019-07-12 VITALS — BP 106/60 | HR 97 | Resp 18 | Ht 60.0 in

## 2019-07-12 DIAGNOSIS — J4540 Moderate persistent asthma, uncomplicated: Secondary | ICD-10-CM

## 2019-07-12 DIAGNOSIS — J3802 Paralysis of vocal cords and larynx, bilateral: Secondary | ICD-10-CM

## 2019-07-12 DIAGNOSIS — J310 Chronic rhinitis: Secondary | ICD-10-CM | POA: Diagnosis not present

## 2019-07-12 DIAGNOSIS — K219 Gastro-esophageal reflux disease without esophagitis: Secondary | ICD-10-CM

## 2019-07-12 DIAGNOSIS — T782XXD Anaphylactic shock, unspecified, subsequent encounter: Secondary | ICD-10-CM | POA: Diagnosis not present

## 2019-07-12 MED ORDER — ALBUTEROL SULFATE HFA 108 (90 BASE) MCG/ACT IN AERS: 1.0000 | INHALATION_SPRAY | Freq: Four times a day (QID) | RESPIRATORY_TRACT | 1 refills | Status: DC | PRN

## 2019-07-12 MED ORDER — LEVOCETIRIZINE DIHYDROCHLORIDE 5 MG PO TABS: 5.0000 mg | ORAL_TABLET | Freq: Every evening | ORAL | 5 refills | Status: DC

## 2019-07-12 MED ORDER — FAMOTIDINE 20 MG PO TABS: 20.0000 mg | ORAL_TABLET | Freq: Every day | ORAL | 5 refills | Status: DC

## 2019-07-12 MED ORDER — AZELASTINE HCL 0.1 % NA SOLN: 1.0000 | Freq: Two times a day (BID) | NASAL | 12 refills | Status: DC

## 2019-07-12 MED ORDER — BUDESONIDE-FORMOTEROL FUMARATE 160-4.5 MCG/ACT IN AERO: 2.0000 | INHALATION_SPRAY | Freq: Two times a day (BID) | RESPIRATORY_TRACT | 5 refills | Status: DC

## 2019-07-12 NOTE — Progress Notes (Signed)
Follow-up Note  RE: April Church MRN: [id number] DOB: May 07, 1940 Date of Office Visit: 07/12/2019   History of present illness: April Church is a 79 y.o. female presenting today for follow-up of asthma, nonallergic rhinitis, anaphylaxis, vocal fold paralysis and reflux.  She was last seen in the office on 01/12/2019 by nurse practitioner Gareth Morgan.  Since this visit she states she has been doing well since she was changed to [place] which she is taking in the evening.  She states this has improved her cough and sneezing.  She also continues to use Astelin in the morning and evenings and will use Atrovent as needed during the day for runny nose. She states she has not needed to use any albuterol since her last visit.  She does continue on Symbicort 160 mcg 2 puffs twice a day.  She denies any ED or urgent care visits or any systemic steroid needs. She continues to avoid shellfish and wine without any accidental ingestions or need to use her epinephrine device. She states she has been trying to see if she can go longer periods between her Botox injections but she states at this point in time her voice is so shaky that she thinks she needs to get back in. She does continue on the Pepcid once a day for control of reflux symptoms.     Review of systems: Review of Systems  Constitutional: Negative.   HENT:       See HPI  Eyes: Negative.   Respiratory: Negative.   Cardiovascular: Negative.   Gastrointestinal: Negative.   Musculoskeletal: Negative.   Skin: Negative.   Neurological: Negative.     All other systems negative unless noted above in HPI  Past medical/social/surgical/family history have been reviewed and are unchanged unless specifically indicated below.  No changes  Medication List: Current Outpatient Medications  Medication Sig Dispense Refill  . albuterol (VENTOLIN HFA) 108 (90 Base) MCG/ACT inhaler Inhale 1 puff into the lungs every 6 (six) hours as needed  for wheezing or shortness of breath. 18 g 1  . amLODipine (NORVASC) 5 MG tablet Take 1 tablet (5 mg total) by mouth 2 (two) times daily.    [name] aspirin 81 MG tablet Take 81 mg by mouth daily.      [name] atorvastatin (LIPITOR) 20 MG tablet Take 20 mg by mouth every other day.     [name] azelastine (ASTELIN) 0.1 % nasal spray Place 1-2 sprays into both nostrils 2 (two) times daily. Use in each nostril as directed 30 mL 12  . budesonide-formoterol (SYMBICORT) 160-4.5 MCG/ACT inhaler Inhale 2 puffs into the lungs 2 (two) times daily. 1 Inhaler 5  . CALCIUM CARBONATE PO Take 1 tablet by mouth 2 (two) times daily.    . carbidopa-levodopa (SINEMET IR) 25-100 MG tablet Take 2 tablets by mouth 3 (three) times daily before meals. 540 tablet 4  . Cholecalciferol (VITAMIN D) 1000 UNITS capsule Take 1,000 Units by mouth 2 (two) times daily.     . Coenzyme Q10 (CO Q 10) 100 MG CAPS Take by mouth.    . cyclobenzaprine (FLEXERIL) 5 MG tablet Take 1 tablet (5 mg total) by mouth at bedtime as needed for muscle spasms (you can take up to 2 tabs/10 mg max if needed). 50 tablet 11  . EPINEPHrine 0.3 mg/0.3 mL IJ SOAJ injection Inject 0.3 mLs (0.3 mg total) into the muscle as needed. 2 each 1  . famotidine (PEPCID) 20 MG tablet Take 1 tablet (  20 mg total) by mouth daily. 30 tablet 5  . hydrOXYzine (ATARAX/VISTARIL) 10 MG tablet Take 10 mg by mouth at bedtime.    [name] ipratropium (ATROVENT) 0.03 % nasal spray Place 2 sprays into both nostrils 2 (two) times daily. 30 mL 5  . levocetirizine (XYZAL) 5 MG tablet Take 1 tablet (5 mg total) by mouth every evening. 30 tablet 5  . Polyethyl Glycol-Propyl Glycol (SYSTANE OP) Place 1 drop into both eyes 2 (two) times daily.    . primidone (MYSOLINE) 50 MG tablet Take 2 tablets (100 mg total) by mouth 3 (three) times daily. 540 tablet 4  . triamcinolone cream (KENALOG) 0.1 % Apply 1 application topically 2 (two) times daily as needed (FOR RASH).     [name] venlafaxine (EFFEXOR) 37.5 MG tablet Take 1  tablet (37.5 mg total) by mouth daily. 90 tablet 3   No current facility-administered medications for this visit.     Known medication allergies: Allergies  Allergen Reactions  . Shellfish Allergy Shortness Of Breath  . Duloxetine Itching and Rash  . Sulfa Drugs Cross Reactors Anxiety and Rash  . Lyrica [Pregabalin] Other (See Comments)    "made me drunk, couldn't function"  . Codeine Anxiety     Physical examination: Blood pressure 106/60, pulse 97, resp. rate 18, height 5' (1.524 m), SpO2 97 %.  General: Alert, interactive, in no acute distress. HEENT: PERRLA, TMs pearly gray, turbinates minimally edematous without discharge, post-pharynx non erythematous. Neck: Supple without lymphadenopathy. Lungs: Clear to auscultation without wheezing, rhonchi or rales. {no increased work of breathing. CV: Normal S1, S2 without murmurs. Abdomen: Nondistended, nontender. Skin: Warm and dry, without lesions or rashes. Extremities:  No clubbing, cyanosis or edema. Neuro:   Grossly intact.  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  Follow emergency action plan in case of allergic reaction  Chronic nonallergic rhinitis Continue Xyzal 5 mg once a day Continue Astelin 1-2 sprays in each nostril in the AM and 1-2 sprays in each nostril in the PM.  Can use Atrovent 2 sprays each nostril up to 3-4 times a day for episodic rhinorrhea  Mild persistent asthma Continue Symbicort 159mcg  2 puffs in the AM  and 2 puffs in the PM Continue Proventil HFA 2 puff into the lungs every 4-6 hours as needed for shortness of breath or wheezing  Vocal fold paralysis, bilateral Continue routine follow-up with Dr. Joya Gaskins with ENT for periodic Botox injections  GERD Continue Pepcid once a day as you have been Continue dietary and lifestyle modifications as listed  below  Follow up in 6 months or sooner if needed   I appreciate the opportunity to take part in Daianna's care. Please do not hesitate to contact me with questions.  Sincerely,   Prudy Feeler, MD Allergy/Immunology [facility]

## 2019-07-12 NOTE — Patient Instructions (Signed)
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  Follow emergency action plan in case of allergic reaction  Chronic nonallergic rhinitis Continue Xyzal 5 mg once a day Continue Astelin 1-2 sprays in each nostril in the AM and 1-2 sprays in each nostril in the PM.   Mild persistent asthma Continue Symbicort 120mcg  2 puffs in the AM  and 2 puffs in the PM Continue Proventil HFA 2 puff into the lungs every 4-6 hours as needed for shortness of breath or wheezing  Vocal fold paralysis, bilateral Continue routine follow-up with Dr. Joya Gaskins with ENT for periodic Botox injections  GERD Continue Pepcid once a day as you have been Continue dietary and lifestyle modifications as listed below  Follow up in 6 months or sooner if needed

## 2019-08-16 ENCOUNTER — Encounter: Payer: Medicare Other | Admitting: Physical Medicine & Rehabilitation

## 2019-09-02 ENCOUNTER — Other Ambulatory Visit: Payer: Self-pay | Admitting: Family Medicine

## 2019-09-13 ENCOUNTER — Telehealth: Payer: Self-pay | Admitting: Adult Health

## 2019-09-13 NOTE — Telephone Encounter (Signed)
Rescheduled appointment per 7/22 message. Patient is aware of updated appointment date and time.

## 2019-09-17 ENCOUNTER — Encounter: Payer: Self-pay | Admitting: Physical Medicine and Rehabilitation

## 2019-09-17 ENCOUNTER — Encounter
Payer: Medicare Other | Attending: Physical Medicine and Rehabilitation | Admitting: Physical Medicine and Rehabilitation

## 2019-09-17 ENCOUNTER — Other Ambulatory Visit: Payer: Self-pay

## 2019-09-17 DIAGNOSIS — M7918 Myalgia, other site: Secondary | ICD-10-CM | POA: Insufficient documentation

## 2019-09-17 DIAGNOSIS — M5416 Radiculopathy, lumbar region: Secondary | ICD-10-CM | POA: Insufficient documentation

## 2019-09-17 DIAGNOSIS — M1710 Unilateral primary osteoarthritis, unspecified knee: Secondary | ICD-10-CM

## 2019-09-17 DIAGNOSIS — M170 Bilateral primary osteoarthritis of knee: Secondary | ICD-10-CM | POA: Diagnosis not present

## 2019-09-17 DIAGNOSIS — R2689 Other abnormalities of gait and mobility: Secondary | ICD-10-CM | POA: Diagnosis not present

## 2019-09-17 NOTE — Patient Instructions (Signed)
1. steroid injection was performed at  B/L knees using 1% plain Lidocaine and 40mg  /1cc of Kenalog. This was well tolerated.  Cleaned with betadine x3 and allowed to dry- then alcohol then injected using 27 gauge 1.5 inch needle- no bleeding or complications.    F/U in 3 months for steroid injections of b/l knees  Lidocaine will kick in 15 minutes- and wear off tonight- the steroid will kick in tomorrow within 24 hours and take up to 72 hours to fully kick in.  2. Has appointment with Dr Letta Pate 09/25/19 for back steroid injection.   3. Con't cyclobenzaprine for muscle cramps.  - has 8 more RFs.   4. F/U in 3 months for knee injections.

## 2019-09-17 NOTE — Progress Notes (Signed)
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   Did injections for back- once.   Back started bothering her again last week.  Has appointment 09/25/19 for Dr Letta Pate.   Medicine for leg cramps has helped.  Just takes 1 (not 2)- helps muscle cramps a lot- takes 1x/nightly.      Plan: 1. steroid injection was performed at  B/L knees using 1% plain Lidocaine and 40mg  /1cc of Kenalog. This was well tolerated.  Cleaned with betadine x3 and allowed to dry- then alcohol then injected using 27 gauge 1.5 inch needle- no bleeding or complications.    F/U in 3 months for steroid injections of b/l knees  Lidocaine will kick in 15 minutes- and wear off tonight- the steroid will kick in tomorrow within 24 hours and take up to 72 hours to fully kick in.  2. Has appointment with Dr Letta Pate 09/25/19 for back steroid injection.   3. Con't cyclobenzaprine for muscle cramps.  - has 8 more RFs.   4. F/U in 3 months for knee injections.

## 2019-09-18 ENCOUNTER — Encounter (HOSPITAL_BASED_OUTPATIENT_CLINIC_OR_DEPARTMENT_OTHER): Payer: Medicare Other | Admitting: Physical Medicine & Rehabilitation

## 2019-09-18 ENCOUNTER — Encounter: Payer: Self-pay | Admitting: Physical Medicine & Rehabilitation

## 2019-09-18 VITALS — BP 142/94 | HR 106 | Temp 98.4°F | Ht 61.0 in | Wt 122.8 lb

## 2019-09-18 DIAGNOSIS — M5416 Radiculopathy, lumbar region: Secondary | ICD-10-CM | POA: Diagnosis not present

## 2019-09-18 DIAGNOSIS — R2689 Other abnormalities of gait and mobility: Secondary | ICD-10-CM | POA: Diagnosis not present

## 2019-09-18 DIAGNOSIS — M7918 Myalgia, other site: Secondary | ICD-10-CM | POA: Diagnosis not present

## 2019-09-18 NOTE — Patient Instructions (Signed)

## 2019-09-18 NOTE — Progress Notes (Signed)
  PROCEDURE RECORD [facility]   Name: ERMINIE FOULKS DOB:1940/09/21 MRN: [id number]  Date:09/18/2019  Physician: Alysia Penna, MD    Nurse/CMA: Jerline Pain MA Allergies:  Allergies  Allergen Reactions  . Shellfish Allergy Shortness Of Breath  . Duloxetine Itching and Rash  . Sulfa Drugs Cross Reactors Anxiety and Rash  . Lyrica [Pregabalin] Other (See Comments)    "made me drunk, couldn't function"  . Codeine Anxiety    Consent Signed: Yes.    Is patient diabetic? No.  CBG today N/A  Pregnant: No. LMP: No LMP recorded. Patient is postmenopausal. (age 21-55)  Anticoagulants: no Anti-inflammatory: no Antibiotics: no  Procedure: Right L4,5 Transforaminal Epidural Injection Position: Prone Start Time: 10:51am  End Time: 10:55am  Fluoro Time: 21  RN/CMA Truman Hayward, CMA Parker MA    Time 10:10am 11:00    BP 142/94 151/93    Pulse 16 16    Respirations 106 97    O2 Sat 96 96    S/S 6 6    Pain Level 5/10 5/10     D/C home with daughter, patient A & O X 3, D/C instructions reviewed, and sits independently.

## 2019-09-18 NOTE — Progress Notes (Signed)
Right L4-5 Lumbar transforaminal epidural steroid injection under fluoroscopic guidance with contrast enhancement  Indication: Lumbosacral radiculitis is not relieved by medication management or other conservative care and interfering with self-care and mobility.   Informed consent was obtained after describing risk and benefits of the procedure with the patient, this includes bleeding, bruising, infection, paralysis and medication side effects.  The patient wishes to proceed and has given written consent.  Patient was placed in prone position.  The lumbar area was marked and prepped with Betadine.  It was entered with a 25-gauge 1-1/2 inch needle and one mL of 1% lidocaine was injected into the skin and subcutaneous tissue.  Then a 22-gauge 3.5 in spinal needle was inserted into the Right L4-5  intervertebral foramen under AP, lateral, and oblique view.  Once needle tip was within the foramen on lateral views an dnor exceeding 6 o clock position on th epedical on AP viewed Isovue 200 was inected x 65ml Then a solution containing one mL of 10 mg per mL dexamethasone and 2 mL of 1% lidocaine was injected.  The patient tolerated procedure well.  Post procedure instructions were given.  Please see post procedure form.

## 2019-09-21 DIAGNOSIS — E785 Hyperlipidemia, unspecified: Secondary | ICD-10-CM | POA: Diagnosis not present

## 2019-09-21 DIAGNOSIS — J45909 Unspecified asthma, uncomplicated: Secondary | ICD-10-CM | POA: Diagnosis not present

## 2019-09-21 DIAGNOSIS — F3341 Major depressive disorder, recurrent, in partial remission: Secondary | ICD-10-CM | POA: Diagnosis not present

## 2019-09-21 DIAGNOSIS — C50512 Malignant neoplasm of lower-outer quadrant of left female breast: Secondary | ICD-10-CM | POA: Diagnosis not present

## 2019-09-21 DIAGNOSIS — M4726 Other spondylosis with radiculopathy, lumbar region: Secondary | ICD-10-CM | POA: Diagnosis not present

## 2019-09-21 DIAGNOSIS — I10 Essential (primary) hypertension: Secondary | ICD-10-CM | POA: Diagnosis not present

## 2019-09-25 ENCOUNTER — Ambulatory Visit: Payer: Medicare Other | Admitting: Physical Medicine & Rehabilitation

## 2019-10-10 ENCOUNTER — Encounter: Payer: Self-pay | Admitting: Diagnostic Neuroimaging

## 2019-10-10 ENCOUNTER — Ambulatory Visit (INDEPENDENT_AMBULATORY_CARE_PROVIDER_SITE_OTHER): Payer: Medicare Other | Admitting: Diagnostic Neuroimaging

## 2019-10-10 VITALS — BP 122/85 | HR 96 | Ht 61.0 in | Wt 122.0 lb

## 2019-10-10 DIAGNOSIS — M4722 Other spondylosis with radiculopathy, cervical region: Secondary | ICD-10-CM

## 2019-10-10 DIAGNOSIS — G20 Parkinson's disease: Secondary | ICD-10-CM

## 2019-10-10 DIAGNOSIS — M4712 Other spondylosis with myelopathy, cervical region: Secondary | ICD-10-CM | POA: Diagnosis not present

## 2019-10-10 DIAGNOSIS — G250 Essential tremor: Secondary | ICD-10-CM

## 2019-10-10 MED ORDER — PRIMIDONE 50 MG PO TABS: 100.0000 mg | ORAL_TABLET | Freq: Three times a day (TID) | ORAL | 4 refills | Status: DC

## 2019-10-10 MED ORDER — CARBIDOPA-LEVODOPA 25-100 MG PO TABS: 2.0000 | ORAL_TABLET | Freq: Three times a day (TID) | ORAL | 4 refills | Status: DC

## 2019-10-10 NOTE — Patient Instructions (Signed)
PARKINSONISM  - continue carb/levo 2 tabs three times a day  - caution with walking and balance    ESSENTIAL TREMOR - continue primidone 100mg  three times a day (may increase to 150 / 150 / 100)

## 2019-10-10 NOTE — Progress Notes (Signed)
[facility]  PATIENT: April Church DOB: 1940-11-18  REFERRING CLINICIAN:  HISTORY FROM: patient REASON FOR VISIT: follow up    HISTORICAL  CHIEF COMPLAINT:  Chief Complaint  Patient presents with  . Parkinson's disease    rm 7, 8 month FU "went to [facility] for pinched nerve in back, having injections which have helped a lot; tremors are worse in middle of day"    HISTORY OF PRESENT ILLNESS:   UPDATE (10/10/19, VRP): Since last visit, tremor slightly progressing. Symptoms are moderate. Severity is moderate. No alleviating or aggravating factors. Tolerating medications.  Lumbar spine injections helping with low back pain.  UPDATE (01/09/19, VRP): Since last visit, doing well. Symptoms are stable. Severity is moderate. No alleviating or aggravating factors. Tolerating carb/levo 2 tabs three times a day. Planning for pain mgmt injections soon for low back pain.     UPDATE (01/31/18, VRP): Since last visit, doing about the same, except slightly more cramps and pain in arms and back. No alleviating or aggravating factors. Tolerating carb/levo.    UPDATE (05/23/17, VRP): Since last visit, doing well. Tolerating meds. No alleviating or aggravating factors. Tremor slightly worse. Some memory issues. Taking care of own ADLs. No major changes.  UPDATE 11/15/16: Since last visit, overall doing well. Tremor stable. Some memory loss issues continue, but not affecting ADLs. Tolerating meds. No falls. 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   UPDATE 05/10/16: Since last visit, stable. Some mild tremors in left hand. Mild balance issues. Mild memory issues. Cyclobenz helps with night time spasms.   UPDATE 11/12/15: Since last visit, tremors are stable. Balance stable. Now with night time neck, shoulder, spine and leg pain (spasm). Had PT and OT evals as well.   UPDATE 05/13/15: Since last visit, tremor in arms better. Had ST and botox for voice issues.  Some wearing off of meds before mid-day dosing. No neck pain issues.   UPDATE 02/12/15: Since last visit, tremor is stable. More voice strangulation issues. Tolerating primidone 100mg  TID.  UPDATE 08/13/14: Since last visit, tried primidone 250mg  BID, but not much tremor benefit; also having more sleepiness in the day time. Still with hand > voice > chin tremor. No falls. Balance getting slightly worse.  UPDATE 05/22/14: Since last visit, tremor progressing. Now notes new resting tremor in the last year. Some balance and walking issues. She is s/p cervical decompression in Sept 2015, with good results.   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 Her tremor does not limit any of her activities of daily life. She denies any side effects of the Mysoline. She also reports today that she's had some numbness and tingling down her right arm from her neck and shoulder. It has been bothering her more in the last 6 months. 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 She saw Dr. Arnoldo Morale back in 2004. She returns for reevaluation  UPDATE 06/07/12 (VRP): Doing well. BUE tremor is stable and controlled. Voice tremor is slightly progressing. Tolerating primidone.  PRIOR HPI (06/09/11, VRP):  79 year old female returns for F/U. Last seen 06/09/10.  She has been followed in this office for many years for essential tremor. She takes primidone 50 mg, 1.5 TID. She tolerates it well. She comes in today saying that her tremor is a little worse before lunch.  The tremor limits her a little bit  with eating and writing but for the most part she is happy with her level of function.She has hx of breast cancer.  No neurologic complaints.    REVIEW OF SYSTEMS: Full 14 system review of systems performed and negative except: as per HPI.    ALLERGIES: Allergies  Allergen Reactions  . Shellfish Allergy  Shortness Of Breath  . Duloxetine Itching and Rash  . Sulfa Drugs Cross Reactors Anxiety and Rash  . Lyrica [Pregabalin] Other (See Comments)    "made me drunk, couldn't function"  . Codeine Anxiety    HOME MEDICATIONS: Outpatient Medications Prior to Visit  Medication Sig Dispense Refill  . albuterol (VENTOLIN HFA) 108 (90 Base) MCG/ACT inhaler Inhale 1 puff into the lungs every 6 (six) hours as needed for wheezing or shortness of breath. 18 g 1  . amLODipine (NORVASC) 5 MG tablet Take 1 tablet (5 mg total) by mouth 2 (two) times daily.    [name] aspirin 81 MG tablet Take 81 mg by mouth daily.      [name] atorvastatin (LIPITOR) 20 MG tablet Take 20 mg by mouth every other day.     [name] azelastine (ASTELIN) 0.1 % nasal spray Place 1-2 sprays into both nostrils 2 (two) times daily. Use in each nostril as directed 30 mL 12  . budesonide-formoterol (SYMBICORT) 160-4.5 MCG/ACT inhaler Inhale 2 puffs into the lungs 2 (two) times daily. 1 Inhaler 5  . CALCIUM CARBONATE PO Take 1 tablet by mouth 2 (two) times daily.    . carbidopa-levodopa (SINEMET IR) 25-100 MG tablet Take 2 tablets by mouth 3 (three) times daily before meals. 540 tablet 4  . Cholecalciferol (VITAMIN D) 1000 UNITS capsule Take 1,000 Units by mouth 2 (two) times daily.     . Coenzyme Q10 (CO Q 10) 100 MG CAPS Take by mouth.    . cyclobenzaprine (FLEXERIL) 5 MG tablet Take 1 tablet (5 mg total) by mouth at bedtime as needed for muscle spasms (you can take up to 2 tabs/10 mg max if needed). 50 tablet 11  . EPINEPHrine 0.3 mg/0.3 mL IJ SOAJ injection Inject 0.3 mLs (0.3 mg total) into the muscle as needed. 2 each 1  . famotidine (PEPCID) 20 MG tablet Take 1 tablet by mouth once daily 30 tablet 3  . hydrOXYzine (ATARAX/VISTARIL) 10 MG tablet Take 10 mg by mouth at bedtime.    [name] ipratropium (ATROVENT) 0.03 % nasal spray Place 2 sprays into both nostrils 2 (two) times daily. 30 mL 5  . levocetirizine (XYZAL) 5 MG tablet Take 1 tablet (5 mg total) by  mouth every evening. 30 tablet 5  . Polyethyl Glycol-Propyl Glycol (SYSTANE OP) Place 1 drop into both eyes 2 (two) times daily.    . primidone (MYSOLINE) 50 MG tablet Take 2 tablets (100 mg total) by mouth 3 (three) times daily. 540 tablet 4  . triamcinolone cream (KENALOG) 0.1 % Apply 1 application topically 2 (two) times daily as needed (FOR RASH).     [name] venlafaxine (EFFEXOR) 37.5 MG tablet Take 1 tablet (37.5 mg total) by mouth daily. 90 tablet 3   No facility-administered medications prior to visit.    PAST MEDICAL HISTORY: Past Medical History:  Diagnosis Date  . Asthma    daily inhaler  . Breast cancer ([place])    left- radiation and surgery -dx. 2011- no further tx. now- [name] , [name]  . Cyst of finger 11/2011   annular cyst right long finger  . Dental crowns  present   . Dermatitis   . Family history of bladder cancer   . Family history of colon cancer   . Family history of leukemia   . Family history of ovarian cancer   . Frequency of urination   . GERD (gastroesophageal reflux disease)   . Hemorrhoid   . Hyperlipidemia   . Hypertension    under control, has been on med. x 2 yrs.  [name] PONV (postoperative nausea and vomiting)   . Seasonal allergies   . Tremors of nervous system    hands  . Trigger finger of right hand 11/2011   long finger    PAST SURGICAL HISTORY: Past Surgical History:  Procedure Laterality Date  . ANTERIOR CERVICAL DECOMPRESSION/DISCECTOMY FUSION 4 LEVELS N/A 11/14/2013   Procedure: ANTERIOR CERVICAL DECOMPRESSION/DISCECTOMY FUSION 4 LEVELS;  Surgeon: Newman Pies, MD;  Location: [facility];  Service: Neurosurgery;  Laterality: N/A;  C34 C45 C56 C67 anterior cervical fusion with interbody prosthesis plating and  bonegraft  . APPENDECTOMY  age 62  . BREAST LUMPECTOMY  06/16/2009   left; SLN bx.  [name] BREAST LUMPECTOMY  07/01/2009   re-excision  . BREAST SURGERY  1999   reduction  . CATARACT EXTRACTION, BILATERAL    . COLONOSCOPY WITH  PROPOFOL N/A 12/03/2014   Procedure: COLONOSCOPY WITH PROPOFOL;  Surgeon: [name], MD;  Location: [facility];  Service: Endoscopy;  Laterality: N/A;  . FOOT SURGERY  06/22/11   left  . KNEE ARTHROSCOPY  03/12/2005   right  . KNEE ARTHROSCOPY     left  . NASAL SINUS SURGERY     x 2  . 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 TRIGGER FINGER RELEASE  12/16/2011   Procedure: RELEASE TRIGGER FINGER/A-1 PULLEY;  Surgeon: Tennis Must, MD;  Location: [facility];  Service: Orthopedics;  Laterality: Right;  RIGHT LONG FINGER TRIGGER RELEASE & ANNULAR CYST EXCISION  . TUMOR EXCISION  age 53   right arm    FAMILY HISTORY: Family History  Problem Relation Age of Onset  . Kidney disease Mother   . Stroke Father   . Stroke Sister        08/2015  . Diabetes Brother   . Heart disease Brother   . Ovarian cancer Sister 34  . Heart disease Paternal Grandmother   . Heart disease Paternal Grandfather   . Leukemia Other 15       brother's grandson  . Bladder Cancer Brother 27  . Colon cancer Niece        dx in late 54s    SOCIAL HISTORY:  Social History   Socioeconomic History  . Marital status: Married    Spouse name: Marcello Moores   . Number of children: 1  . Years of education: HS  . Highest education level: Not on file  Occupational History  . Occupation: Retired  Tobacco Use  . Smoking status: Never Smoker  . Smokeless tobacco: Never Used  Substance and Sexual Activity  . Alcohol use: No    Alcohol/week: 0.0 standard drinks  . Drug use: No  . Sexual activity: Yes  Other Topics Concern  . Not on file  Social History Narrative      Pt lives at home with her spouse.  [name]    Patient has 1 child.    Patient is retired.    Patient has a HS education.    Patient is right handed.  Patient drinks caffeine occasionally.              Social Determinants of Health   Financial Resource Strain:   . Difficulty of Paying  Living Expenses:   Food Insecurity:   . Worried About Charity fundraiser in the Last Year:   . Arboriculturist in the Last Year:   Transportation Needs:   . Film/video editor (Medical):   [name] Lack of Transportation (Non-Medical):   Physical Activity:   . Days of Exercise per Week:   . Minutes of Exercise per Session:   Stress:   . Feeling of Stress :   Social Connections:   . Frequency of Communication with Friends and Family:   . Frequency of Social Gatherings with Friends and Family:   . Attends Religious Services:   . Active Member of Clubs or Organizations:   . Attends Archivist Meetings:   [name] Marital Status:   Intimate Partner Violence:   . Fear of Current or Ex-Partner:   . Emotionally Abused:   [name] Physically Abused:   . Sexually Abused:     PHYSICAL EXAM  Vitals:   10/10/19 1340  BP: 122/85  Pulse: 96  Weight: 122 lb (55.3 kg)  Height: 5\' 1"  (1.549 m)   Body mass index is 23.05 kg/m.  GENERAL EXAM: Patient is in no distress  CARDIOVASCULAR: Regular rate and rhythm, no murmurs, no carotid bruits  NEUROLOGIC: MENTAL STATUS: awake, alert, language fluent, comprehension intact, naming intact; MASKED FACIES; NEG FRONTAL RELEASE SIGNS MMSE - Mini Mental State Exam 05/23/2017  Orientation to time 5  Orientation to Place 5  Registration 3  Attention/ Calculation 1  Recall 2  Language- name 2 objects 2  Language- repeat 1  Language- follow 3 step command 3  Language- read & follow direction 1  Write a sentence 1  Copy design 1  Total score 25    CRANIAL NERVE: pupils equal and reactive to light, visual fields full to confrontation, extraocular muscles intact, no nystagmus, facial sensation and strength symmetric, uvula midline, shoulder shrug symmetric, tongue midline; SOFT HOARSE VOICE MOTOR: normal bulk, POSTURAL AND ACTION TREMOR; INT REST TREMOR IN RUE > LUE; INT MOUTH TREMOR; BRADYKINESIA IN LUE >> RUE; AND BLE; MILD COGWHEELING IN RUE >  LUE; full strength in the BUE, BLE; VOICE TREMOR SENSORY: normal and symmetric to light touch COORDINATION: finger-nose-finger, fine finger movements normal REFLEXES: deep tendon reflexes present and symmetric GAIT/STATION: narrow based gait; SLOW AND CAUTIOUS, DECR RIGHT ARM SWING    DIAGNOSTIC DATA (LABS, IMAGING, TESTING) - I reviewed patient records, labs, notes, testing and imaging myself where available.  Lab Results  Component Value Date   WBC 5.2 01/20/2015   HGB 12.0 01/20/2015   HCT 36.1 01/20/2015   MCV 88.4 01/20/2015   PLT 293 01/20/2015      Component Value Date/Time   NA 136 01/20/2015 1326   K 4.1 01/20/2015 1326   CL 100 11/06/2013 1032   CL 102 06/30/2012 1231   CO2 27 01/20/2015 1326   GLUCOSE 66 (L) 01/20/2015 1326   GLUCOSE 77 06/30/2012 1231   BUN 13.9 01/20/2015 1326   CREATININE 0.9 01/20/2015 1326   CALCIUM 8.8 01/20/2015 1326   PROT 7.4 01/20/2015 1326   ALBUMIN 3.6 01/20/2015 1326   AST 17 01/20/2015 1326   ALT 13 01/20/2015 1326   ALKPHOS 86 01/20/2015 1326   BILITOT <0.30 01/20/2015 1326   GFRNONAA 83 (L)  11/06/2013 1032   GFRAA >90 11/06/2013 1032   No results found for: CHOL No results found for: HGBA1C No results found for: VITAMINB12 No results found for: TSH   MRI brain - unremarkable per patient (done ~ early 2000's)  06/25/13 EMG/NCS 1. 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 2. No evidence of widespread underlying large fiber neuropathy.  11/14/13 CXR [I reviewed images myself and agree with interpretation. -VRP]  - Anterior cervical disc fusion localization images as described.  10/11/18 MRI lumbar spine  MRI lumbar spine (without) demonstrating: - At L4-5: disc bulging and facet hypertrophy with severe right and moderate left foraminal stenosis. - At L3-4: disc bulging and facet hypertrophy with moderate right  and mild left foraminal stenosis. - At L5-S1: disc bulging and facet hypertrophy with mild right and moderate left foraminal stenosis.    ASSESSMENT AND PLAN  79 y.o. year old female  has a past medical history of Asthma, Breast cancer ([place]), Cyst of finger (11/2011), Dental crowns present, Dermatitis, Family history of bladder cancer, Family history of colon cancer, Family history of leukemia, Family history of ovarian cancer, Frequency of urination, GERD (gastroesophageal reflux disease), Hemorrhoid, Hyperlipidemia, Hypertension, PONV (postoperative nausea and vomiting), Seasonal allergies, Tremors of nervous system, and Trigger finger of right hand (11/2011). here with essential tremor since 1990's. Now with intermittent resting tremor AND bradykinesia since 2016. May represent essential tremor with superimposed parkinson's disease + cervical myelopathy sequelae. DATscan ordered but denied (to confirm superimposed parkinsonism on top of essential tremor and cervical myelopathy).  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)  Parkinson's disease (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 carbidopa-levodopa (SINEMET IR) 25-100 MG tablet    Sig: Take 2 tablets  by mouth 3 (three) times daily before meals.    Dispense:  540 tablet    Refill:  4  . primidone (MYSOLINE) 50 MG tablet    Sig: Take 2 tablets (100 mg total) by mouth 3 (three) times daily.    Dispense:  540 tablet    Refill:  4   Return in about 1 year (around 10/09/2020).     Penni Bombard, MD 8/85/0277, 4:12 PM Certified in Neurology, Neurophysiology and Neuroimaging  [facility] [address], [place], [place] [address] [phone]

## 2019-10-12 DIAGNOSIS — J385 Laryngeal spasm: Secondary | ICD-10-CM | POA: Diagnosis not present

## 2019-10-15 DIAGNOSIS — F3341 Major depressive disorder, recurrent, in partial remission: Secondary | ICD-10-CM | POA: Diagnosis not present

## 2019-10-15 DIAGNOSIS — I10 Essential (primary) hypertension: Secondary | ICD-10-CM | POA: Diagnosis not present

## 2019-10-15 DIAGNOSIS — M4726 Other spondylosis with radiculopathy, lumbar region: Secondary | ICD-10-CM | POA: Diagnosis not present

## 2019-10-15 DIAGNOSIS — E785 Hyperlipidemia, unspecified: Secondary | ICD-10-CM | POA: Diagnosis not present

## 2019-10-15 DIAGNOSIS — C50512 Malignant neoplasm of lower-outer quadrant of left female breast: Secondary | ICD-10-CM | POA: Diagnosis not present

## 2019-10-15 DIAGNOSIS — J45909 Unspecified asthma, uncomplicated: Secondary | ICD-10-CM | POA: Diagnosis not present

## 2019-10-22 DIAGNOSIS — Z23 Encounter for immunization: Secondary | ICD-10-CM | POA: Diagnosis not present

## 2019-11-21 DIAGNOSIS — I10 Essential (primary) hypertension: Secondary | ICD-10-CM | POA: Diagnosis not present

## 2019-11-21 DIAGNOSIS — D224 Melanocytic nevi of scalp and neck: Secondary | ICD-10-CM | POA: Diagnosis not present

## 2019-11-21 DIAGNOSIS — F3341 Major depressive disorder, recurrent, in partial remission: Secondary | ICD-10-CM | POA: Diagnosis not present

## 2019-11-21 DIAGNOSIS — L820 Inflamed seborrheic keratosis: Secondary | ICD-10-CM | POA: Diagnosis not present

## 2019-11-21 DIAGNOSIS — L821 Other seborrheic keratosis: Secondary | ICD-10-CM | POA: Diagnosis not present

## 2019-11-21 DIAGNOSIS — E785 Hyperlipidemia, unspecified: Secondary | ICD-10-CM | POA: Diagnosis not present

## 2019-11-21 DIAGNOSIS — Z23 Encounter for immunization: Secondary | ICD-10-CM | POA: Diagnosis not present

## 2019-11-21 DIAGNOSIS — B078 Other viral warts: Secondary | ICD-10-CM | POA: Diagnosis not present

## 2019-11-21 DIAGNOSIS — M4726 Other spondylosis with radiculopathy, lumbar region: Secondary | ICD-10-CM | POA: Diagnosis not present

## 2019-11-21 DIAGNOSIS — D1801 Hemangioma of skin and subcutaneous tissue: Secondary | ICD-10-CM | POA: Diagnosis not present

## 2019-11-22 ENCOUNTER — Ambulatory Visit: Payer: Medicare Other | Admitting: Physical Therapy

## 2019-11-22 ENCOUNTER — Other Ambulatory Visit: Payer: Self-pay

## 2019-11-22 ENCOUNTER — Ambulatory Visit: Payer: Medicare Other

## 2019-11-22 ENCOUNTER — Ambulatory Visit: Payer: Medicare Other | Attending: Internal Medicine | Admitting: Occupational Therapy

## 2019-11-22 DIAGNOSIS — R1310 Dysphagia, unspecified: Secondary | ICD-10-CM

## 2019-11-22 DIAGNOSIS — R2689 Other abnormalities of gait and mobility: Secondary | ICD-10-CM

## 2019-11-22 DIAGNOSIS — R29818 Other symptoms and signs involving the nervous system: Secondary | ICD-10-CM | POA: Insufficient documentation

## 2019-11-22 NOTE — Therapy (Signed)
[facility] [address] [place], [place], [address] Phone: [phone]   Fax:  [phone]  Patient Details  Name: [name] MRN: [id number] Date of Birth: 11-May-1940 Referring Provider:  Leeroy Cha,*  Encounter Date: 11/22/2019   Occupational Therapy Parkinson's Disease Screen  Hand dominance:  RUE   Physical Performance Test item #2 (simulated eating):  15.59 sec  Physical Performance Test item #4 (donning/doffing jacket):  12.62 sec  Fastening/unfastening 3 buttons in:  37.47sec  9-hole peg test:    RUE  37.15 sec        LUE  30.25 sec  Change in ability to perform ADLs/IADLs:  Writing and eating continue to be difficult due to tremor and depending on time/day, but reports no significant changes.  Other Comments:  Pt reports mild incr tremors in LUE and recent medication change to address.   Pt does not require occupational therapy services at this time.  Recommended occupational therapy screen in   approx 6 months.     [facility] 11/22/2019, 10:20 AM  [facility] [address] [place], [place], [address] Phone: [phone]   Fax:  [place], OTR/L [facility] [address]. [place], [place]  [phone] phone [phone] 11/22/19 10:20 AM

## 2019-11-22 NOTE — Therapy (Signed)
[facility] [address] [place], [place], [address] Phone: [phone]   Fax:  [phone]  Patient Details  Name: April Church MRN: [id number] Date of Birth: 04-19-1940 Referring Provider:  Curt Bears., MD  Encounter Date: 11/22/2019    Speech Therapy Parkinson's Disease Screen   Decibel Level today: low 70s dB  (WNL=70-72 dB) with sound level meter 30cm away from pt's mouth. Pt's conversational volume has remained essentially the same since last screen.  Pt continues to report difficulty in swallowing - reports coughing on liquids routinely. Given this report, further evaluation in pt's swallowing is warranted- a modified barium swallow exam (MBSS) or fiber-endoscopic evaluation of swalloiwng (FEES). Either of these evaluations could be ordered via EPIC or call [phone] to schedule.  Pt does does not require speech therapy at this time, however SLP recommends objective swallow evaluation. Swallowing therapy may be recommended after the objective swallowing evaluation and could be completed at this center.   [facility] ,[place], [place]  11/22/2019, 10:51 AM  [facility] [address] [place], [place], [address] Phone: [phone]   Fax:  [phone]

## 2019-11-22 NOTE — Therapy (Signed)
[facility] [address] [place], [place], [address] Phone: [phone]   Fax:  [phone]  Patient Details  Name: April Church MRN: [id number] Date of Birth: 1940/09/27 Referring Provider:  Leta Baptist  Encounter Date: 11/22/2019   Physical Therapy Parkinson's Disease Screen   Timed Up and Go test: 11.69 sec   10 meter walk test: 3.28 ft/sec  5 time sit to stand test: 17.41 sec  Patient does not require Physical Therapy services at this time.  Recommend Physical Therapy screen in 6-9 months.      [place] Bynum W. 11/22/2019, 10:51 AM  Frazier Butt., PT   [facility] [address] [place], [place], [address] Phone: [phone]   Fax:  [phone]

## 2019-12-21 ENCOUNTER — Encounter: Payer: Medicare Other | Admitting: Physical Medicine and Rehabilitation

## 2020-01-10 ENCOUNTER — Ambulatory Visit: Payer: Medicare Other | Admitting: Allergy

## 2020-01-15 ENCOUNTER — Ambulatory Visit: Payer: Medicare Other

## 2020-01-15 ENCOUNTER — Ambulatory Visit: Payer: Medicare Other | Admitting: Physical Therapy

## 2020-01-15 ENCOUNTER — Ambulatory Visit: Payer: Medicare Other | Admitting: Occupational Therapy

## 2020-01-20 ENCOUNTER — Other Ambulatory Visit: Payer: Self-pay | Admitting: Allergy

## 2020-01-20 ENCOUNTER — Other Ambulatory Visit: Payer: Self-pay | Admitting: Diagnostic Neuroimaging

## 2020-01-21 ENCOUNTER — Encounter: Payer: Medicare Other | Admitting: Physical Medicine and Rehabilitation

## 2020-01-21 DIAGNOSIS — G4700 Insomnia, unspecified: Secondary | ICD-10-CM | POA: Diagnosis not present

## 2020-01-21 DIAGNOSIS — C50512 Malignant neoplasm of lower-outer quadrant of left female breast: Secondary | ICD-10-CM | POA: Diagnosis not present

## 2020-01-21 DIAGNOSIS — E785 Hyperlipidemia, unspecified: Secondary | ICD-10-CM | POA: Diagnosis not present

## 2020-01-21 DIAGNOSIS — M4726 Other spondylosis with radiculopathy, lumbar region: Secondary | ICD-10-CM | POA: Diagnosis not present

## 2020-01-21 DIAGNOSIS — F3341 Major depressive disorder, recurrent, in partial remission: Secondary | ICD-10-CM | POA: Diagnosis not present

## 2020-01-21 DIAGNOSIS — J45909 Unspecified asthma, uncomplicated: Secondary | ICD-10-CM | POA: Diagnosis not present

## 2020-01-21 DIAGNOSIS — I10 Essential (primary) hypertension: Secondary | ICD-10-CM | POA: Diagnosis not present

## 2020-01-21 DIAGNOSIS — G20 Parkinson's disease: Secondary | ICD-10-CM | POA: Diagnosis not present

## 2020-02-08 DIAGNOSIS — J45909 Unspecified asthma, uncomplicated: Secondary | ICD-10-CM | POA: Diagnosis not present

## 2020-02-08 DIAGNOSIS — E785 Hyperlipidemia, unspecified: Secondary | ICD-10-CM | POA: Diagnosis not present

## 2020-02-08 DIAGNOSIS — C50512 Malignant neoplasm of lower-outer quadrant of left female breast: Secondary | ICD-10-CM | POA: Diagnosis not present

## 2020-02-08 DIAGNOSIS — M4726 Other spondylosis with radiculopathy, lumbar region: Secondary | ICD-10-CM | POA: Diagnosis not present

## 2020-02-08 DIAGNOSIS — G4700 Insomnia, unspecified: Secondary | ICD-10-CM | POA: Diagnosis not present

## 2020-02-08 DIAGNOSIS — F3341 Major depressive disorder, recurrent, in partial remission: Secondary | ICD-10-CM | POA: Diagnosis not present

## 2020-02-08 DIAGNOSIS — I10 Essential (primary) hypertension: Secondary | ICD-10-CM | POA: Diagnosis not present

## 2020-02-08 DIAGNOSIS — G20 Parkinson's disease: Secondary | ICD-10-CM | POA: Diagnosis not present

## 2020-02-12 ENCOUNTER — Encounter: Payer: Medicare Other | Admitting: Adult Health

## 2020-02-25 ENCOUNTER — Telehealth: Payer: Self-pay | Admitting: Adult Health

## 2020-02-25 ENCOUNTER — Inpatient Hospital Stay: Payer: Medicare Other | Admitting: Adult Health

## 2020-02-25 NOTE — Telephone Encounter (Signed)
Rescheduled appointment per 1/3 schedule message. Patient is aware of changes. 

## 2020-02-27 ENCOUNTER — Ambulatory Visit: Payer: Medicare Other | Admitting: Allergy

## 2020-02-29 ENCOUNTER — Encounter: Payer: Medicare Other | Admitting: Adult Health

## 2020-03-03 ENCOUNTER — Encounter: Payer: Medicare Other | Admitting: Physical Medicine and Rehabilitation

## 2020-03-04 ENCOUNTER — Other Ambulatory Visit: Payer: Self-pay | Admitting: Allergy

## 2020-03-11 DIAGNOSIS — Z1231 Encounter for screening mammogram for malignant neoplasm of breast: Secondary | ICD-10-CM | POA: Diagnosis not present

## 2020-03-24 DIAGNOSIS — G20 Parkinson's disease: Secondary | ICD-10-CM | POA: Diagnosis not present

## 2020-03-24 DIAGNOSIS — J45909 Unspecified asthma, uncomplicated: Secondary | ICD-10-CM | POA: Diagnosis not present

## 2020-03-24 DIAGNOSIS — G4700 Insomnia, unspecified: Secondary | ICD-10-CM | POA: Diagnosis not present

## 2020-03-24 DIAGNOSIS — E785 Hyperlipidemia, unspecified: Secondary | ICD-10-CM | POA: Diagnosis not present

## 2020-03-24 DIAGNOSIS — M4726 Other spondylosis with radiculopathy, lumbar region: Secondary | ICD-10-CM | POA: Diagnosis not present

## 2020-03-24 DIAGNOSIS — C50512 Malignant neoplasm of lower-outer quadrant of left female breast: Secondary | ICD-10-CM | POA: Diagnosis not present

## 2020-03-24 DIAGNOSIS — I10 Essential (primary) hypertension: Secondary | ICD-10-CM | POA: Diagnosis not present

## 2020-03-24 DIAGNOSIS — F3341 Major depressive disorder, recurrent, in partial remission: Secondary | ICD-10-CM | POA: Diagnosis not present

## 2020-03-26 ENCOUNTER — Ambulatory Visit: Payer: Medicare Other | Admitting: Allergy

## 2020-04-01 ENCOUNTER — Other Ambulatory Visit: Payer: Self-pay | Admitting: Allergy

## 2020-04-01 NOTE — Telephone Encounter (Signed)
Will call pt. to schedule an appointment she has cancelled 3 appointments and requested refills and then cancelled her appointments afterwards.

## 2020-04-04 ENCOUNTER — Other Ambulatory Visit: Payer: Medicare Other

## 2020-04-04 ENCOUNTER — Encounter: Payer: Medicare Other | Admitting: Physical Medicine and Rehabilitation

## 2020-04-04 DIAGNOSIS — Z20822 Contact with and (suspected) exposure to covid-19: Secondary | ICD-10-CM

## 2020-04-05 LAB — SARS-COV-2, NAA 2 DAY TAT

## 2020-04-05 LAB — NOVEL CORONAVIRUS, NAA: SARS-CoV-2, NAA: DETECTED — AB

## 2020-04-06 ENCOUNTER — Encounter: Payer: Self-pay | Admitting: Nurse Practitioner

## 2020-04-06 ENCOUNTER — Telehealth: Payer: Self-pay | Admitting: Nurse Practitioner

## 2020-04-06 DIAGNOSIS — E785 Hyperlipidemia, unspecified: Secondary | ICD-10-CM | POA: Insufficient documentation

## 2020-04-06 NOTE — Telephone Encounter (Signed)
I called DELARA SHEPHEARD to discuss Covid symptoms and the use of Sotrovimab, a monoclonal antibody infusion for those with mild to moderate Covid symptoms and at a high risk of hospitalization.    Pt does not qualify for infusion therapy as pt's symptoms first presented > 7 days prior to timing of infusion, and she is no longer symptomatic. Symptoms tier reviewed as well as criteria for ending isolation. Preventative practices reviewed. Patient verbalized understanding.     Murray Hodgkins, NP

## 2020-04-21 ENCOUNTER — Telehealth: Payer: Self-pay

## 2020-04-21 ENCOUNTER — Other Ambulatory Visit: Payer: Self-pay | Admitting: Allergy

## 2020-04-21 NOTE — Telephone Encounter (Signed)
Called and spoke to patient to inform her that she needed to be seen so that we can continue refilling her prescriptions.Patient stated that she has had to cancel her appointments due to her husband being ill and in the hospital. I informed her that we could do a televisit and she agreed.

## 2020-04-22 DIAGNOSIS — E785 Hyperlipidemia, unspecified: Secondary | ICD-10-CM | POA: Diagnosis not present

## 2020-04-22 DIAGNOSIS — J45909 Unspecified asthma, uncomplicated: Secondary | ICD-10-CM | POA: Diagnosis not present

## 2020-04-22 DIAGNOSIS — F3341 Major depressive disorder, recurrent, in partial remission: Secondary | ICD-10-CM | POA: Diagnosis not present

## 2020-04-22 DIAGNOSIS — M4726 Other spondylosis with radiculopathy, lumbar region: Secondary | ICD-10-CM | POA: Diagnosis not present

## 2020-04-22 DIAGNOSIS — I10 Essential (primary) hypertension: Secondary | ICD-10-CM | POA: Diagnosis not present

## 2020-04-22 DIAGNOSIS — G4700 Insomnia, unspecified: Secondary | ICD-10-CM | POA: Diagnosis not present

## 2020-04-22 DIAGNOSIS — C50512 Malignant neoplasm of lower-outer quadrant of left female breast: Secondary | ICD-10-CM | POA: Diagnosis not present

## 2020-04-22 DIAGNOSIS — G20 Parkinson's disease: Secondary | ICD-10-CM | POA: Diagnosis not present

## 2020-04-24 ENCOUNTER — Ambulatory Visit (INDEPENDENT_AMBULATORY_CARE_PROVIDER_SITE_OTHER): Payer: Medicare Other | Admitting: Family Medicine

## 2020-04-24 ENCOUNTER — Other Ambulatory Visit: Payer: Self-pay

## 2020-04-24 ENCOUNTER — Encounter: Payer: Self-pay | Admitting: Family Medicine

## 2020-04-24 DIAGNOSIS — J4540 Moderate persistent asthma, uncomplicated: Secondary | ICD-10-CM | POA: Diagnosis not present

## 2020-04-24 DIAGNOSIS — J3802 Paralysis of vocal cords and larynx, bilateral: Secondary | ICD-10-CM

## 2020-04-24 DIAGNOSIS — J310 Chronic rhinitis: Secondary | ICD-10-CM | POA: Diagnosis not present

## 2020-04-24 DIAGNOSIS — T782XXD Anaphylactic shock, unspecified, subsequent encounter: Secondary | ICD-10-CM | POA: Diagnosis not present

## 2020-04-24 DIAGNOSIS — K219 Gastro-esophageal reflux disease without esophagitis: Secondary | ICD-10-CM | POA: Diagnosis not present

## 2020-04-24 MED ORDER — FAMOTIDINE 20 MG PO TABS
20.0000 mg | ORAL_TABLET | Freq: Every day | ORAL | 5 refills | Status: DC
Start: 2020-04-24 — End: 2021-02-25

## 2020-04-24 MED ORDER — LEVOCETIRIZINE DIHYDROCHLORIDE 5 MG PO TABS: 5.0000 mg | ORAL_TABLET | Freq: Every evening | ORAL | 5 refills | Status: DC

## 2020-04-24 MED ORDER — IPRATROPIUM BROMIDE 0.03 % NA SOLN: 2.0000 | Freq: Two times a day (BID) | NASAL | 5 refills | Status: DC

## 2020-04-24 MED ORDER — EPINEPHRINE 0.3 MG/0.3ML IJ SOAJ: 0.3000 mg | Freq: Once | INTRAMUSCULAR | 1 refills | Status: AC

## 2020-04-24 MED ORDER — AZELASTINE HCL 0.1 % NA SOLN
1.0000 | Freq: Two times a day (BID) | NASAL | 5 refills | Status: DC
Start: 2020-04-24 — End: 2021-02-25

## 2020-04-24 MED ORDER — SYMBICORT 160-4.5 MCG/ACT IN AERO: 2.0000 | INHALATION_SPRAY | Freq: Two times a day (BID) | RESPIRATORY_TRACT | 5 refills | Status: DC

## 2020-04-24 NOTE — Patient Instructions (Addendum)
Asthma Continue Symbicort 160 mg  2puffs in the AM and 2 puffs in the PM Continue Proventil HFA 2 puff into the lungs every 4-6 hours as needed for shortness of breath or wheezing You may use albuterol 2 puffs 5 to 15 minutes before activity to decrease cough or wheeze  Chronic nonallergic rhinitis Continue Xyzal 5 mg once a day as needed for a runny nose. Remember to rotate to a different antihistamine about every 3 months. Some examples of over the counter antihistamines include Zyrtec (cetirizine), Xyzal (levocetirizine), Allegra (fexofenadine), and Claritin (loratidine).  Begin Atrovent 0.3% use 2 sprays in each nostril twice a day as needed for a runny nose Continue Astelin and increase to 2 sprays in each nostril in the AM and 2 sprays in each nostril in the PM.   Vocal fold paralysis, bilateral Continue routine follow-up with Dr. Joya Gaskins with ENT for periodic Botox injections as prescribed  GERD Continue famotidine 20 mg once a day as you have been Continue dietary and lifestyle modifications as listed below  Food allergy Continue avoidance of shellfish and wine  In case of an allergic reaction, take Benadryl 50 mg every 4 hours, and if life-threatening symptoms occur, inject with EpiPen 0.3 mg.  Follow up in 6 months or sooner if needed   Lifestyle Changes for Controlling GERD When you have GERD, stomach acid feels as if it's backing up toward your mouth. Whether or not you take medication to control your GERD, your symptoms can often be improved with lifestyle changes.   Raise Your Head  Reflux is more likely to strike when you're lying down flat, because stomach fluid can  flow backward more easily. Raising the head of your bed 4-6 inches can help. To do this:  Slide blocks or books under the legs at the head of your bed. Or, place a wedge under  the mattress. Many foam stores can make a suitable wedge for you. The wedge  should run from your waist to the top of your  head.  Don't just prop your head on several pillows. This increases pressure on your  stomach. It can make GERD worse.  Watch Your Eating Habits Certain foods may increase the acid in your stomach or relax the lower esophageal sphincter, making GERD more likely. It's best to avoid the following:  Coffee, tea, and carbonated drinks (with and without caffeine)  Fatty, fried, or spicy food  Mint, chocolate, onions, and tomatoes  Any other foods that seem to irritate your stomach or cause you pain  Relieve the Pressure  Eat smaller meals, even if you have to eat more often.  Don't lie down right after you eat. Wait a few hours for your stomach to empty.  Avoid tight belts and tight-fitting clothes.  Lose excess weight.  Tobacco and Alcohol  Avoid smoking tobacco and drinking alcohol. They can make GERD symptoms worse.

## 2020-04-24 NOTE — Progress Notes (Signed)
RE: April Church MRN: [id number] DOB: July 28, 1940 Date of Telemedicine Visit: 04/24/2020  Referring provider: Leeroy Church,* Primary care provider: Leeroy Cha, MD  Chief Complaint: Follow-up and Medication Refill   Telemedicine Follow Up Visit via Telephone: I connected with April Church for a follow up on 04/24/20 by telephone and verified that I am speaking with the correct person using two identifiers.   I discussed the limitations, risks, security and privacy concerns of performing an evaluation and management service by telephone and the availability of in person appointments. I also discussed with the patient that there may be a patient responsible charge related to this service. The patient expressed understanding and agreed to proceed.  Patient is at home  Provider is at the office.  Visit start time: 225 Visit end time: 45 Insurance consent/check in by: [facility] Medical consent and medical assistant/nurse: April Church  History of Present Illness: She is a 80 y.o. 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 Her previous allergy office visit was on 07/12/2019 with Dr. Nelva Church.  At today's visit she reports her asthma has been well controlled with no shortness of breath or wheeze with activity or rest.  She does report occasional cough which is reported as dry.  She continues Symbicort 160-2 puffs twice a day with a spacer and uses albuterol about once a month.  Chronic rhinitis is reported as well controlled with only occasional sneezing for which she continues Xyzal 5 mg once a day, azelastine 1 spray in each nostril in the morning and at night, and occasional use of Atrovent.  She reports significant improvement in her symptoms since beginning Xyzal.  Reflux is reported as well controlled with no symptoms including heartburn or vomiting.  She continues famotidine 20 mg once  a day.  She continues to avoid shellfish and wine with no accidental ingestion or EpiPen use since her last visit to this clinic.  She reports her last Botox injection for vocal cord paralysis was about 2 months ago with Dr. Joya Church, ENT.  She does report experiencing some hoarseness over the last couple weeks and is considering getting another Botox injection.  She reports that after receiving the Botox injection she is unable to speak for 7 to 10 days and reports that this would not be appropriate as her husband is currently in ill health and in and out of the hospital.  Her current medications are listed in the chart.  Assessment and Plan: April Church is a 80 y.o. female with: Patient Instructions  Asthma Continue Symbicort 160 mg  2puffs in the AM and 2 puffs in the PM Continue Proventil HFA 2 puff into the lungs every 4-6 hours as needed for shortness of breath or wheezing You may use albuterol 2 puffs 5 to 15 minutes before activity to decrease cough or wheeze  Chronic nonallergic rhinitis Continue Xyzal 5 mg once a day as needed for a runny nose. Remember to rotate to a different antihistamine about every 3 months. Some examples of over the counter antihistamines include Zyrtec (cetirizine), Xyzal (levocetirizine), Allegra (fexofenadine), and Claritin (loratidine).  Begin Atrovent 0.3% use 2 sprays in each nostril twice a day as needed for a runny nose Continue Astelin and increase to 2 sprays in each nostril in the AM and 2 sprays in each nostril in the PM.   Vocal fold paralysis, bilateral Continue routine follow-up with Dr. Joya Church with ENT for periodic Botox injections  as prescribed  GERD Continue famotidine 20 mg once a day as you have been Continue dietary and lifestyle modifications as listed below  Food allergy Continue avoidance of shellfish and wine  In case of an allergic reaction, take Benadryl 50 mg every 4 hours, and if life-threatening symptoms occur, inject with EpiPen 0.3  mg.  Follow up in 6 months or sooner if needed     No follow-ups on file.  Meds ordered this encounter  Medications  . azelastine (ASTELIN) 0.1 % nasal spray    Sig: Place 1-2 sprays into both nostrils 2 (two) times daily. Use in each nostril as directed    Dispense:  30 mL    Refill:  5    Please keep on file for when the patient needs to pick up.  . SYMBICORT 160-4.5 MCG/ACT inhaler    Sig: Inhale 2 puffs into the lungs 2 (two) times daily.    Dispense:  10.2 g    Refill:  5  . famotidine (PEPCID) 20 MG tablet    Sig: Take 1 tablet (20 mg total) by mouth daily.    Dispense:  30 tablet    Refill:  5  . ipratropium (ATROVENT) 0.03 % nasal spray    Sig: Place 2 sprays into both nostrils 2 (two) times daily.    Dispense:  30 mL    Refill:  5  . levocetirizine (XYZAL) 5 MG tablet    Sig: Take 1 tablet (5 mg total) by mouth every evening.    Dispense:  30 tablet    Refill:  5  . EPINEPHrine (EPIPEN 2-PAK) 0.3 mg/0.3 mL IJ SOAJ injection    Sig: Inject 0.3 mg into the muscle once for 1 dose.    Dispense:  0.3 mL    Refill:  1    Please keep on the phone for when the patient needs to pick up.    Medication List:  Current Outpatient Medications  Medication Sig Dispense Refill  . albuterol (VENTOLIN HFA) 108 (90 Base) MCG/ACT inhaler Inhale 1 puff into the lungs every 6 (six) hours as needed for wheezing or shortness of breath. 18 g 1  . amLODipine (NORVASC) 5 MG tablet Take 1 tablet (5 mg total) by mouth 2 (two) times daily.    [name] atorvastatin (LIPITOR) 20 MG tablet Take 20 mg by mouth every other day.     [name] CALCIUM CARBONATE PO Take 1 tablet by mouth 2 (two) times daily.    . carbidopa-levodopa (SINEMET IR) 25-100 MG tablet Take 2 tablets by mouth 3 (three) times daily before meals. 540 tablet 4  . Cholecalciferol (VITAMIN D) 1000 UNITS capsule Take 1,000 Units by mouth 2 (two) times daily.    . Coenzyme Q10 (CO Q 10) 100 MG CAPS Take by mouth.    . cyclobenzaprine (FLEXERIL)  5 MG tablet Take 1 tablet (5 mg total) by mouth at bedtime as needed for muscle spasms (you can take up to 2 tabs/10 mg max if needed). 50 tablet 11  . EPINEPHrine (EPIPEN 2-PAK) 0.3 mg/0.3 mL IJ SOAJ injection Inject 0.3 mg into the muscle once for 1 dose. 0.3 mL 1  . EPINEPHrine 0.3 mg/0.3 mL IJ SOAJ injection Inject 0.3 mLs (0.3 mg total) into the muscle as needed. 2 each 1  . hydrOXYzine (ATARAX/VISTARIL) 10 MG tablet Take 10 mg by mouth at bedtime.    Vladimir Faster Glycol-Propyl Glycol (SYSTANE OP) Place 1 drop into both eyes 2 (two) times daily.    [name]  primidone (MYSOLINE) 50 MG tablet Take 2 tablets (100 mg total) by mouth 3 (three) times daily. 540 tablet 4  . triamcinolone cream (KENALOG) 0.1 % Apply 1 application topically 2 (two) times daily as needed (FOR RASH).     [name] venlafaxine (EFFEXOR) 37.5 MG tablet Take 1 tablet (37.5 mg total) by mouth daily. 90 tablet 3  . azelastine (ASTELIN) 0.1 % nasal spray Place 1-2 sprays into both nostrils 2 (two) times daily. Use in each nostril as directed 30 mL 5  . famotidine (PEPCID) 20 MG tablet Take 1 tablet (20 mg total) by mouth daily. 30 tablet 5  . ipratropium (ATROVENT) 0.03 % nasal spray Place 2 sprays into both nostrils 2 (two) times daily. 30 mL 5  . levocetirizine (XYZAL) 5 MG tablet Take 1 tablet (5 mg total) by mouth every evening. 30 tablet 5  . SYMBICORT 160-4.5 MCG/ACT inhaler Inhale 2 puffs into the lungs 2 (two) times daily. 10.2 g 5   No current facility-administered medications for this visit.   Allergies: Allergies  Allergen Reactions  . Shellfish Allergy Shortness Of Breath  . Duloxetine Itching and Rash  . Sulfa Drugs Cross Reactors Anxiety and Rash  . Lyrica [Pregabalin] Other (See Comments)    "made me drunk, couldn't function"  . Codeine Anxiety   I reviewed her past medical history, social history, family history, and environmental history and no significant changes have been reported from previous visit on  07/17/2019.  Objective: Physical Exam Not obtained as encounter was done via telephone.   Previous notes and tests were reviewed.  I discussed the assessment and treatment plan with the patient. The patient was provided an opportunity to ask questions and all were answered. The patient agreed with the plan and demonstrated an understanding of the instructions.   The patient was advised to call back or seek an in-person evaluation if the symptoms worsen or if the condition fails to improve as anticipated.  I provided 26 minutes of non-face-to-face time during this encounter.  It was my pleasure to participate in [place] care today. Please feel free to contact me with any questions or concerns.   Sincerely,  Gareth Morgan, FNP

## 2020-04-25 ENCOUNTER — Ambulatory Visit: Payer: Self-pay | Admitting: Family Medicine

## 2020-04-28 ENCOUNTER — Encounter
Payer: Medicare Other | Attending: Physical Medicine and Rehabilitation | Admitting: Physical Medicine and Rehabilitation

## 2020-04-28 DIAGNOSIS — M5416 Radiculopathy, lumbar region: Secondary | ICD-10-CM | POA: Insufficient documentation

## 2020-04-28 DIAGNOSIS — M1712 Unilateral primary osteoarthritis, left knee: Secondary | ICD-10-CM | POA: Insufficient documentation

## 2020-04-28 DIAGNOSIS — M1711 Unilateral primary osteoarthritis, right knee: Secondary | ICD-10-CM | POA: Insufficient documentation

## 2020-04-28 DIAGNOSIS — R2689 Other abnormalities of gait and mobility: Secondary | ICD-10-CM | POA: Insufficient documentation

## 2020-05-12 ENCOUNTER — Other Ambulatory Visit: Payer: Self-pay

## 2020-05-12 ENCOUNTER — Encounter: Payer: Self-pay | Admitting: Physical Medicine and Rehabilitation

## 2020-05-12 ENCOUNTER — Encounter (HOSPITAL_BASED_OUTPATIENT_CLINIC_OR_DEPARTMENT_OTHER): Payer: Medicare Other | Admitting: Physical Medicine and Rehabilitation

## 2020-05-12 VITALS — BP 127/83 | HR 93 | Temp 98.1°F | Ht 60.0 in | Wt 124.6 lb

## 2020-05-12 DIAGNOSIS — M5416 Radiculopathy, lumbar region: Secondary | ICD-10-CM | POA: Diagnosis not present

## 2020-05-12 DIAGNOSIS — M1711 Unilateral primary osteoarthritis, right knee: Secondary | ICD-10-CM

## 2020-05-12 DIAGNOSIS — M1712 Unilateral primary osteoarthritis, left knee: Secondary | ICD-10-CM

## 2020-05-12 DIAGNOSIS — R2689 Other abnormalities of gait and mobility: Secondary | ICD-10-CM

## 2020-05-12 MED ORDER — VENLAFAXINE HCL 75 MG PO TABS: 75.0000 mg | ORAL_TABLET | Freq: Every day | ORAL | 3 refills | Status: DC

## 2020-05-12 NOTE — Progress Notes (Signed)
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  Also has B/L knee DJD arthritis s/p steroid injectionsand dysphonia s/p Botox. 09/18/19 Back injection- R L4-5 transforminal.  epidural steroid injection.   Here for B/ knee injections and grief issues due to husband passing.    Has been awhile since last injection- Husband passed away May 15, 2020- was married 69 years.   Had so many 911 calls to take to hospital- he was ready.  He decided no more ventilator- so, passed in the hospital.  Had heart issues since 80 yrs old- died at 80 yrs old. He required HD at the end, since only had wife's kidney.  Major MI in January.   Has been on Effexor 37.5 mg daily- since had breast cancer.   Sleeping OK- tries Melatonin over the counter.   Plan: 1. Suggest increasing Effexor to 75 mg daily-  Will do so for pt- sent in 75 mg daily- 3 months supply with 3 refills- so 1 year of meds.     2. Take melatonin 5 mg nightly- can take 2.5 mg if need be. For sleep- is over the counter.   3. steroid injection was performed at B/L knees B/L using 1% plain Lidocaine and 40mg  /1cc of Kenalog. This was well tolerated.  Cleaned with betadine x3 and allowed to dry- then alcohol then injected using 27 gauge 1.5 inch needle- no bleeding or complications.    F/U in 3 months for B/L knee steroid injections Lidocaine will kick in 15 minutes- and wear off tonight- the steroid will kick in tomorrow within 24 hours and take up to 72 hours to fully kick in.  4. Is allergic to shellfish, but not topical iodine.   5. F/U- 4 months For possible B/L knee steroid injections Will assess at f/u.

## 2020-05-12 NOTE — Patient Instructions (Signed)
Plan: 1. Suggest increasing Effexor to 75 mg daily-  Will do so for pt- sent in 75 mg daily- 3 months supply with 3 refills- so 1 year of meds.     2. Take melatonin 5 mg nightly- can take 2.5 mg if need be. For sleep- is over the counter.   3. steroid injection was performed at B/L knees B/L using 1% plain Lidocaine and 40mg  /1cc of Kenalog. This was well tolerated.  Cleaned with betadine x3 and allowed to dry- then alcohol then injected using 27 gauge 1.5 inch needle- no bleeding or complications.    F/U in 3 months for B/L knee steroid injections Lidocaine will kick in 15 minutes- and wear off tonight- the steroid will kick in tomorrow within 24 hours and take up to 72 hours to fully kick in.  4. Is allergic to shellfish, but not topical iodine.

## 2020-05-22 ENCOUNTER — Ambulatory Visit: Payer: Medicare Other | Attending: Internal Medicine

## 2020-05-22 ENCOUNTER — Ambulatory Visit: Payer: Medicare Other | Admitting: Physical Therapy

## 2020-05-22 ENCOUNTER — Ambulatory Visit: Payer: Medicare Other | Admitting: Occupational Therapy

## 2020-05-22 ENCOUNTER — Other Ambulatory Visit: Payer: Self-pay

## 2020-05-22 DIAGNOSIS — R278 Other lack of coordination: Secondary | ICD-10-CM

## 2020-05-22 DIAGNOSIS — R29818 Other symptoms and signs involving the nervous system: Secondary | ICD-10-CM

## 2020-05-22 DIAGNOSIS — R1310 Dysphagia, unspecified: Secondary | ICD-10-CM | POA: Insufficient documentation

## 2020-05-22 NOTE — Therapy (Signed)
[facility] [address] [place], [place], [address] Phone: [phone]   Fax:  [phone]  Patient Details  Name: April Church MRN: [id number] Date of Birth: 24-Sep-1940 Referring Provider:  Leeroy Cha,*  Encounter Date: 05/22/2020    Physical Therapy Parkinson's Disease Screen   Timed Up and Go test: 10.09 seconds   10 meter walk test: 10.19 seconds = 3.22 ft/sec  5 time sit to stand test: 18.3 seconds with no UE support  Patient does not require Physical Therapy services at this time.  Recommend Physical Therapy screen in 6-9 months.   Arliss Journey, PT, DPT  05/22/2020, 10:36 AM  [facility] [address] [place], [place], [address] Phone: [phone]   Fax:  [phone]

## 2020-05-22 NOTE — Therapy (Signed)
[facility] [address] [place], [place], [address] Phone: [phone]   Fax:  [phone]  Patient Details  Name: April Church MRN: [id number] Date of Birth: [birth year] Referring Provider:  Curt Bears., MD   Encounter Date: 05/22/2020    Speech Therapy Parkinson's Disease Screen         Decibel Level today: low 70s dB  (WNL=70-72 dB) with sound level meter 30cm away from pt's mouth. Pt's conversational volume has remained essentially unchanged since the pt's last screen. Pt does report more hoarseness in her voice compared to previous screen. SLP did not notice a change in pt's vocal quality today.   Pt continues to report difficulty in swallowing - reports routinely coughing on liquids. Pt tells SLP she would like to have this assessed. Given this report, further evaluation in pt's swallowing is warranted- a modified barium swallow exam (MBSS) or fiber-endoscopic evaluation of swalloiwng (FEES). Either of these evaluations could be ordered via Epic, or call [phone] to schedule.   Please note this is not the first time pt has complained of coughing/choking with liquids to this SLP, and she again voiced a concern about this to SLP: SLP told [name] her ENT or her PCP could also make a referral for an objective swallow assessment.    Pt may require speech therapy - to be determined after ENT visit at [facility] for consult about botox injection for vocal tremor. SLP cont to recommend objective swallow evaluation as suggested above. Swallowing therapy may be recommended after the objective swallowing evaluation and could be completed at this center.    [facility], [place]  05/22/2020, 10:50 AM  [facility] [address] [place], [place], [address] Phone: [phone]   Fax:  [phone]

## 2020-05-22 NOTE — Therapy (Signed)
[facility] [address] [place], [place], [address] Phone: [phone]   Fax:  [phone]  Patient Details  Name: STORY CONTI MRN: [id number] Date of Birth: Dec 19, 1940 Referring Provider:  Leeroy Cha,*  Encounter Date: 05/22/2020  Occupational Therapy Parkinson's Disease Screen  Hand dominance:  RUE   Physical Performance Test item #2 (simulated eating):  13.87 sec  Physical Performance Test item #4 (donning/doffing jacket):  17.60 sec (pt's jacket)  Fastening/unfastening 3 buttons in:  28.91sec  9-hole peg test:    RUE  36.09 sec        LUE  30.06 sec  Box & Blocks Test:   RUE  29 blocks        LUE  35 blocks  Change in ability to perform ADLs/IADLs:  No change per pt report  Pt does not require occupational therapy services at this time.  Recommended occupational therapy screen in   6-8 months    [facility] 05/22/2020, 10:38 AM  [facility] [address] [place], [place], [address] Phone: [phone]   Fax:  [place], OTR/L [facility] [address] [place] , [place]  [address] [phone] phone [phone] 05/22/20 10:38 AM

## 2020-06-05 DIAGNOSIS — E785 Hyperlipidemia, unspecified: Secondary | ICD-10-CM | POA: Diagnosis not present

## 2020-06-05 DIAGNOSIS — G250 Essential tremor: Secondary | ICD-10-CM | POA: Diagnosis not present

## 2020-06-05 DIAGNOSIS — J45909 Unspecified asthma, uncomplicated: Secondary | ICD-10-CM | POA: Diagnosis not present

## 2020-06-05 DIAGNOSIS — R413 Other amnesia: Secondary | ICD-10-CM | POA: Diagnosis not present

## 2020-06-05 DIAGNOSIS — G20 Parkinson's disease: Secondary | ICD-10-CM | POA: Diagnosis not present

## 2020-06-05 DIAGNOSIS — C4491 Basal cell carcinoma of skin, unspecified: Secondary | ICD-10-CM | POA: Diagnosis not present

## 2020-06-05 DIAGNOSIS — M4726 Other spondylosis with radiculopathy, lumbar region: Secondary | ICD-10-CM | POA: Diagnosis not present

## 2020-06-05 DIAGNOSIS — Z7189 Other specified counseling: Secondary | ICD-10-CM | POA: Diagnosis not present

## 2020-06-05 DIAGNOSIS — Z86010 Personal history of colonic polyps: Secondary | ICD-10-CM | POA: Diagnosis not present

## 2020-06-05 DIAGNOSIS — I10 Essential (primary) hypertension: Secondary | ICD-10-CM | POA: Diagnosis not present

## 2020-06-05 DIAGNOSIS — F3341 Major depressive disorder, recurrent, in partial remission: Secondary | ICD-10-CM | POA: Diagnosis not present

## 2020-06-05 DIAGNOSIS — Z0000 Encounter for general adult medical examination without abnormal findings: Secondary | ICD-10-CM | POA: Diagnosis not present

## 2020-06-05 DIAGNOSIS — C50512 Malignant neoplasm of lower-outer quadrant of left female breast: Secondary | ICD-10-CM | POA: Diagnosis not present

## 2020-06-09 DIAGNOSIS — Z23 Encounter for immunization: Secondary | ICD-10-CM | POA: Diagnosis not present

## 2020-06-26 ENCOUNTER — Other Ambulatory Visit: Payer: Self-pay | Admitting: Physical Medicine and Rehabilitation

## 2020-07-03 DIAGNOSIS — F3341 Major depressive disorder, recurrent, in partial remission: Secondary | ICD-10-CM | POA: Diagnosis not present

## 2020-07-03 DIAGNOSIS — E785 Hyperlipidemia, unspecified: Secondary | ICD-10-CM | POA: Diagnosis not present

## 2020-07-03 DIAGNOSIS — G4700 Insomnia, unspecified: Secondary | ICD-10-CM | POA: Diagnosis not present

## 2020-07-03 DIAGNOSIS — M4726 Other spondylosis with radiculopathy, lumbar region: Secondary | ICD-10-CM | POA: Diagnosis not present

## 2020-07-03 DIAGNOSIS — G20 Parkinson's disease: Secondary | ICD-10-CM | POA: Diagnosis not present

## 2020-07-03 DIAGNOSIS — J45909 Unspecified asthma, uncomplicated: Secondary | ICD-10-CM | POA: Diagnosis not present

## 2020-07-03 DIAGNOSIS — C50512 Malignant neoplasm of lower-outer quadrant of left female breast: Secondary | ICD-10-CM | POA: Diagnosis not present

## 2020-07-03 DIAGNOSIS — I10 Essential (primary) hypertension: Secondary | ICD-10-CM | POA: Diagnosis not present

## 2020-07-14 DIAGNOSIS — I83893 Varicose veins of bilateral lower extremities with other complications: Secondary | ICD-10-CM | POA: Diagnosis not present

## 2020-07-14 DIAGNOSIS — I8312 Varicose veins of left lower extremity with inflammation: Secondary | ICD-10-CM | POA: Diagnosis not present

## 2020-07-14 DIAGNOSIS — I8311 Varicose veins of right lower extremity with inflammation: Secondary | ICD-10-CM | POA: Diagnosis not present

## 2020-07-14 DIAGNOSIS — I83813 Varicose veins of bilateral lower extremities with pain: Secondary | ICD-10-CM | POA: Diagnosis not present

## 2020-07-17 DIAGNOSIS — L820 Inflamed seborrheic keratosis: Secondary | ICD-10-CM | POA: Diagnosis not present

## 2020-07-24 DIAGNOSIS — I8311 Varicose veins of right lower extremity with inflammation: Secondary | ICD-10-CM | POA: Diagnosis not present

## 2020-07-30 ENCOUNTER — Other Ambulatory Visit: Payer: Self-pay

## 2020-07-30 ENCOUNTER — Encounter
Payer: Medicare Other | Attending: Physical Medicine and Rehabilitation | Admitting: Physical Medicine and Rehabilitation

## 2020-07-30 ENCOUNTER — Other Ambulatory Visit: Payer: Self-pay | Admitting: Physical Medicine and Rehabilitation

## 2020-07-30 ENCOUNTER — Encounter: Payer: Self-pay | Admitting: Physical Medicine and Rehabilitation

## 2020-07-30 VITALS — BP 133/90 | HR 104 | Temp 98.3°F | Ht 60.0 in | Wt 124.8 lb

## 2020-07-30 DIAGNOSIS — M79662 Pain in left lower leg: Secondary | ICD-10-CM | POA: Diagnosis not present

## 2020-07-30 DIAGNOSIS — M7918 Myalgia, other site: Secondary | ICD-10-CM | POA: Diagnosis not present

## 2020-07-30 DIAGNOSIS — R29898 Other symptoms and signs involving the musculoskeletal system: Secondary | ICD-10-CM | POA: Insufficient documentation

## 2020-07-30 DIAGNOSIS — M79661 Pain in right lower leg: Secondary | ICD-10-CM

## 2020-07-30 DIAGNOSIS — M1712 Unilateral primary osteoarthritis, left knee: Secondary | ICD-10-CM | POA: Insufficient documentation

## 2020-07-30 DIAGNOSIS — N270 Small kidney, unilateral: Secondary | ICD-10-CM | POA: Insufficient documentation

## 2020-07-30 MED ORDER — TRAMADOL HCL 50 MG PO TABS: 50.0000 mg | ORAL_TABLET | Freq: Two times a day (BID) | ORAL | 0 refills | Status: DC

## 2020-07-30 NOTE — Progress Notes (Signed)
Subjective:    Patient ID: April Church, female    DOB: 1940/06/02, 80 y.o.   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  Also has B/L knee DJD arthritis s/p steroid injectionsand dysphonia s/p Botox. 09/18/19 Back injection- R L4-5 transforminal.  epidural steroid injection.  Last B/L knee injections 05/12/20 Here for f/u as well as eval for B/L calf pain/LE weakness  B/L legs hurt in the muscles- B/L posterior calves.  Radiates sometimes into R foot. Both legs but R>L.   Started 2 months ago- started getting unbearable last month.   Has been walking less- not doing a whole lot of walking- Was slowing down; and then got pain, so slowed down even more.   Has to  help get her out of the car- family afraid she'd fall-Just getting up from sitting is the main issue, but can move once in standing position.   Also has to push with arms- when gets out of bed; gets up form a chair, etc.    Has tried tylenol- eases it off. But doesn't last- tylenol only lasts 1-2 hours at most.   Has taken cholesterol medicine- same dose for >5 years 3x/week- backed off because made legs ache.  Doesn't ache the same way- this feels different.    Went for PT evaluation 2 months ago- and supposed to see q6 months. For evaluation.   Pain Inventory Average Pain 8 Pain Right Now 4 My pain is burning and aching  In the last 24 hours, has pain interfered with the following? General activity 4 Relation with others 4 Enjoyment of life 8 What TIME of day is your pain at its worst? morning  Sleep (in general) Good  Pain is worse with: standing Pain improves with: medication Relief from Meds: 3  Family History  Problem Relation Age of Onset  . Kidney disease Mother   . Stroke Father   . Stroke  Sister        08/2015  . Diabetes Brother   . Heart disease Brother   . Ovarian cancer Sister 33  . Heart disease Paternal Grandmother   . Heart disease Paternal Grandfather   . Leukemia Other 15       brother's grandson  . Bladder Cancer Brother 60  . Colon cancer Niece        dx in late 73s   Social History   Socioeconomic History  . Marital status: Married    Spouse name: Marcello Moores   . Number of children: 1  . Years of education: HS  . Highest education level: Not on file  Occupational History  . Occupation: Retired  Tobacco Use  . Smoking status: Never Smoker  . Smokeless tobacco: Never Used  Substance and Sexual Activity  . Alcohol use: No    Alcohol/week: 0.0 standard drinks  . Drug use: No  . Sexual activity: Yes  Other Topics Concern  . Not on file  Social History Narrative      Pt lives at home with her spouse.  [name]    Patient has 1 child.    Patient is retired.    Patient has a HS education.    Patient is right handed.    Patient drinks caffeine occasionally.  Social Determinants of Health   Financial Resource Strain: Not on file  Food Insecurity: Not on file  Transportation Needs: Not on file  Physical Activity: Not on file  Stress: Not on file  Social Connections: Not on file   Past Surgical History:  Procedure Laterality Date  . ANTERIOR CERVICAL DECOMPRESSION/DISCECTOMY FUSION 4 LEVELS N/A 11/14/2013   Procedure: ANTERIOR CERVICAL DECOMPRESSION/DISCECTOMY FUSION 4 LEVELS;  Surgeon: Newman Pies, MD;  Location: [facility];  Service: Neurosurgery;  Laterality: N/A;  C34 C45 C56 C67 anterior cervical fusion with interbody prosthesis plating and  bonegraft  . APPENDECTOMY  age 80  . BREAST LUMPECTOMY  06/16/2009   left; SLN bx.  [name] BREAST LUMPECTOMY  07/01/2009   re-excision  . BREAST SURGERY  1999   reduction  . CATARACT EXTRACTION, BILATERAL    . COLONOSCOPY WITH PROPOFOL N/A 12/03/2014   Procedure: COLONOSCOPY WITH PROPOFOL;   Surgeon: [name], MD;  Location: [facility];  Service: Endoscopy;  Laterality: N/A;  . FOOT SURGERY  06/22/11   left  . KNEE ARTHROSCOPY  03/12/2005   right  . KNEE ARTHROSCOPY     left  . NASAL SINUS SURGERY     x 2  . 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 TRIGGER FINGER RELEASE  12/16/2011   Procedure: RELEASE TRIGGER FINGER/A-1 PULLEY;  Surgeon: Tennis Must, MD;  Location: [facility];  Service: Orthopedics;  Laterality: Right;  RIGHT LONG FINGER TRIGGER RELEASE & ANNULAR CYST EXCISION  . TUMOR EXCISION  age 16   right arm   Past Surgical History:  Procedure Laterality Date  . ANTERIOR CERVICAL DECOMPRESSION/DISCECTOMY FUSION 4 LEVELS N/A 11/14/2013   Procedure: ANTERIOR CERVICAL DECOMPRESSION/DISCECTOMY FUSION 4 LEVELS;  Surgeon: Newman Pies, MD;  Location: [facility];  Service: Neurosurgery;  Laterality: N/A;  C34 C45 C56 C67 anterior cervical fusion with interbody prosthesis plating and  bonegraft  . APPENDECTOMY  age 78  . BREAST LUMPECTOMY  06/16/2009   left; SLN bx.  [name] BREAST LUMPECTOMY  07/01/2009   re-excision  . BREAST SURGERY  1999   reduction  . CATARACT EXTRACTION, BILATERAL    . COLONOSCOPY WITH PROPOFOL N/A 12/03/2014   Procedure: COLONOSCOPY WITH PROPOFOL;  Surgeon: [name], MD;  Location: [facility];  Service: Endoscopy;  Laterality: N/A;  . FOOT SURGERY  06/22/11   left  . KNEE ARTHROSCOPY  03/12/2005   right  . KNEE ARTHROSCOPY     left  . NASAL SINUS SURGERY     x 2  . 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 TRIGGER FINGER RELEASE  12/16/2011   Procedure: RELEASE TRIGGER FINGER/A-1 PULLEY;  Surgeon: Tennis Must, MD;  Location: [facility];  Service: Orthopedics;  Laterality: Right;  RIGHT LONG FINGER TRIGGER RELEASE & ANNULAR CYST EXCISION  . TUMOR EXCISION  age 6   right arm   Past Medical History:  Diagnosis Date  . Asthma     daily inhaler  . Breast cancer ([place])    left- radiation and surgery -dx. 2011- no further tx. now- [name] , [name]  . Cyst of finger 11/2011   annular cyst right long finger  . Dental crowns present   . Dermatitis   . Family history of bladder cancer   . Family history of colon cancer   . Family history of leukemia   . Family history  of ovarian cancer   . Frequency of urination   . GERD (gastroesophageal reflux disease)   . Hemorrhoid   . Hyperlipidemia   . Hypertension    under control, has been on med. x 2 yrs.  [name] PONV (postoperative nausea and vomiting)   . Seasonal allergies   . Tremors of nervous system    hands  . Trigger finger of right hand 11/2011   long finger   BP 133/90   Pulse (!) 104   Temp 98.3 F (36.8 C)   Ht 5' (1.524 m)   Wt 124 lb 12.8 oz (56.6 kg)   SpO2 96%   BMI 24.37 kg/m   Opioid Risk Score:   Fall Risk Score:  `1  Depression screen PHQ 2/9  Depression screen PHQ 2/9 05/12/2020  Decreased Interest 1  Down, Depressed, Hopeless 1  PHQ - 2 Score 2  Some recent data might be hidden    Review of Systems  Constitutional: Negative.   HENT: Negative.   Eyes: Negative.   Respiratory: Negative.   Cardiovascular: Negative.   Gastrointestinal: Negative.   Endocrine: Negative.   Genitourinary: Negative.   Musculoskeletal: Positive for gait problem.       Pain in both legs  Skin: Negative.   Allergic/Immunologic: Negative.   Hematological: Negative.   Psychiatric/Behavioral: Negative.        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  Don't see any skin color changes B/L No temperatures on the feet or calves and no significant muscle tightness on posterior calves or anterior tibialis.   MS: RLE: HF 4/5 , KE 5-/5 and DF/PF 5/5 LLE: HF 4+/5, KE 5/5, and DF/PF  are 5/5 Has extreme difficulty standing up from good surface height without using hands/arms-   2+ pulses in feet B/L trace ankle edema.      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  Also has B/L knee DJD arthritis s/p steroid injectionsand dysphonia s/p Botox. 09/18/19 Back injection- R L4-5 transforminal.  epidural steroid injection.  Last B/L knee injections 05/12/20 Here for f/u as well as eval for B/L calf pain/LE weakness  1. Has 1 kidney- hasn't had Cr checked in awhile and we need to know for pain meds.   2. Will try Tramadol - 50 mg 2x/day as needed- will change dose as required if kidney function comes back better than expected.  If this works- will get UDS and opiate contract  3. Will send for outpt PT and neurorehab for Parkinson' and LE weakness.   4. Supposed to see her 6/22 for knee injections.   I spent a total of 30 minutes on visit- doing work up, discussing what the possibilities of the weakness and pain are coming from, discussing tramadol, opiate and side effect chances, and UDS/opaite contract- ruled out vascular or statin causes.

## 2020-07-30 NOTE — Patient Instructions (Signed)
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  Also has B/L knee DJD arthritis s/p steroid injectionsand dysphonia s/p Botox. 09/18/19 Back injection- R L4-5 transforminal.  epidural steroid injection.  Last B/L knee injections 05/12/20 Here for f/u as well as eval for B/L calf pain/LE weakness  1. Has 1 kidney- hasn't had Cr checked in awhile and we need to know for pain meds.   2. Will try Tramadol - 50 mg 2x/day as needed- will change dose as required if kidney function comes back better than expected.  If this works- will get UDS and opiate contract  3. Will send for outpt PT and neurorehab for Parkinson' and LE weakness.   4. Supposed to see her 6/22 for knee injections.

## 2020-07-31 LAB — BASIC METABOLIC PANEL
BUN/Creatinine Ratio: 17 (ref 12–28)
BUN: 11 mg/dL (ref 8–27)
CO2: 25 mmol/L (ref 20–29)
Calcium: 9.1 mg/dL (ref 8.7–10.3)
Chloride: 100 mmol/L (ref 96–106)
Creatinine, Ser: 0.65 mg/dL (ref 0.57–1.00)
Glucose: 89 mg/dL (ref 65–99)
Potassium: 4.5 mmol/L (ref 3.5–5.2)
Sodium: 142 mmol/L (ref 134–144)
eGFR: 89 mL/min/{1.73_m2} (ref >59)

## 2020-08-06 DIAGNOSIS — G4700 Insomnia, unspecified: Secondary | ICD-10-CM | POA: Diagnosis not present

## 2020-08-06 DIAGNOSIS — E785 Hyperlipidemia, unspecified: Secondary | ICD-10-CM | POA: Diagnosis not present

## 2020-08-06 DIAGNOSIS — F3341 Major depressive disorder, recurrent, in partial remission: Secondary | ICD-10-CM | POA: Diagnosis not present

## 2020-08-06 DIAGNOSIS — G20 Parkinson's disease: Secondary | ICD-10-CM | POA: Diagnosis not present

## 2020-08-06 DIAGNOSIS — C50512 Malignant neoplasm of lower-outer quadrant of left female breast: Secondary | ICD-10-CM | POA: Diagnosis not present

## 2020-08-06 DIAGNOSIS — M4726 Other spondylosis with radiculopathy, lumbar region: Secondary | ICD-10-CM | POA: Diagnosis not present

## 2020-08-06 DIAGNOSIS — I10 Essential (primary) hypertension: Secondary | ICD-10-CM | POA: Diagnosis not present

## 2020-08-06 DIAGNOSIS — J45909 Unspecified asthma, uncomplicated: Secondary | ICD-10-CM | POA: Diagnosis not present

## 2020-08-11 ENCOUNTER — Other Ambulatory Visit: Payer: Self-pay

## 2020-08-11 ENCOUNTER — Ambulatory Visit: Payer: Medicare Other | Attending: Internal Medicine | Admitting: Physical Therapy

## 2020-08-11 DIAGNOSIS — R2689 Other abnormalities of gait and mobility: Secondary | ICD-10-CM | POA: Diagnosis not present

## 2020-08-11 DIAGNOSIS — M79662 Pain in left lower leg: Secondary | ICD-10-CM

## 2020-08-11 DIAGNOSIS — M6281 Muscle weakness (generalized): Secondary | ICD-10-CM | POA: Diagnosis not present

## 2020-08-11 DIAGNOSIS — R2681 Unsteadiness on feet: Secondary | ICD-10-CM | POA: Diagnosis not present

## 2020-08-11 DIAGNOSIS — M79661 Pain in right lower leg: Secondary | ICD-10-CM

## 2020-08-11 DIAGNOSIS — R293 Abnormal posture: Secondary | ICD-10-CM | POA: Diagnosis not present

## 2020-08-11 NOTE — Patient Instructions (Signed)
Access Code: [email or web address] URL: [email or web address] Date: 08/11/2020 Prepared by: Mady Haagensen  Exercises Supine Bridge - 1 x daily - 5 x weekly - 1 sets - 10 reps Supine Quadricep Sets - 1 x daily - 5 x weekly - 1 sets - 10 reps - 3 sec hold Seated Hamstring Stretch - 1 x daily - 5 x weekly - 1 sets - 3 reps - 15 sec hold

## 2020-08-11 NOTE — Therapy (Signed)
[facility] [address] [place], [place], [address] Phone: [phone]   Fax:  [phone]  Physical Therapy Evaluation  Patient Details  Name: April Church MRN: [id number] Date of Birth: 06-29-1940 Referring Provider (PT): Dr. Courtney Heys   Encounter Date: 08/11/2020   PT End of Session - 08/11/20 1541     Visit Number 1    Number of Visits 16    Date for PT Re-Evaluation 10/03/20    Authorization Type Medicare    Progress Note Due on Visit 10    PT Start Time 1316    PT Stop Time 1404    PT Time Calculation (min) 48 min    Activity Tolerance Patient tolerated treatment well    Behavior During Therapy [facility] for tasks assessed/performed             Past Medical History:  Diagnosis Date   Asthma    daily inhaler   Breast cancer ([place])    left- radiation and surgery -dx. 2011- no further tx. now- [name] , [name]   Cyst of finger 11/2011   annular cyst right long finger   Dental crowns present    Dermatitis    Family history of bladder cancer    Family history of colon cancer    Family history of leukemia    Family history of ovarian cancer    Frequency of urination    GERD (gastroesophageal reflux disease)    Hemorrhoid    Hyperlipidemia    Hypertension    under control, has been on med. x 2 yrs.   PONV (postoperative nausea and vomiting)    Seasonal allergies    Tremors of nervous system    hands   Trigger finger of right hand 11/2011   long finger    Past Surgical History:  Procedure Laterality Date   ANTERIOR CERVICAL DECOMPRESSION/DISCECTOMY FUSION 4 LEVELS N/A 11/14/2013   Procedure: ANTERIOR CERVICAL DECOMPRESSION/DISCECTOMY FUSION 4 LEVELS;  Surgeon: Newman Pies, MD;  Location: [facility];  Service: Neurosurgery;  Laterality: N/A;  C34 C45 C56 C67 anterior cervical fusion with interbody prosthesis plating and  bonegraft   APPENDECTOMY  age 80   BREAST LUMPECTOMY   06/16/2009   left; SLN bx.   BREAST LUMPECTOMY  07/01/2009   re-excision   BREAST SURGERY  1999   reduction   CATARACT EXTRACTION, BILATERAL     COLONOSCOPY WITH PROPOFOL N/A 12/03/2014   Procedure: COLONOSCOPY WITH PROPOFOL;  Surgeon: [name], MD;  Location: [facility];  Service: Endoscopy;  Laterality: N/A;   FOOT SURGERY  06/22/11   left   KNEE ARTHROSCOPY  03/12/2005   right   KNEE ARTHROSCOPY     left   NASAL SINUS SURGERY     x 2   NEPHRECTOMY LIVING DONOR Left 04/2008   donated to spouse 2010([facility])   TRIGGER FINGER RELEASE  12/16/2011   Procedure: RELEASE TRIGGER FINGER/A-1 PULLEY;  Surgeon: Tennis Must, MD;  Location: [facility];  Service: Orthopedics;  Laterality: Right;  RIGHT LONG FINGER TRIGGER RELEASE & ANNULAR CYST EXCISION   TUMOR EXCISION  age 72   right arm    There were no vitals filed for this visit.    Subjective Assessment - 08/11/20 1322     Subjective Having a hard time with my legs-I feel like I'm getting weak, especially if I've sat for too long, it's hard to get started walking.  Having a  lot of pain in bilateral calves.  Feel like the pain and weakness, even before beach trip [place] weekend.  No reported falls.    Limitations Walking;Standing    How long can you stand comfortably? < 5 minutes    How long can you walk comfortably? 10 minutes or less    Patient Stated Goals Pt's goals for therapy are to get rid of pain, and improve strength.    Currently in Pain? Yes    Pain Score 8     Pain Location Leg   bilateral calves   Pain Orientation Right;Left;Posterior;Lower   R>L   Pain Descriptors / Indicators Aching;Tightness    Pain Type Chronic pain    Pain Onset More than a month ago    Pain Frequency Intermittent    Aggravating Factors  worse when standing; hurts to go up stairs    Pain Relieving Factors when I start moving                [facility] PT Assessment - 08/11/20 1331       Assessment   Medical  Diagnosis Bilateral calf pain; leg weakness    Referring Provider (PT) Dr. Courtney Heys    Onset Date/Surgical Date 07/30/20   MD visit/order   Hand Dominance Right    Prior Therapy Neurorehab early 2021      Precautions   Precautions Fall      Balance Screen   Has the patient fallen in the past 6 months No    Has the patient had a decrease in activity level because of a fear of falling?  Yes    Is the patient reluctant to leave their home because of a fear of falling?  No      Home Ecologist residence    Living Arrangements Spouse/significant other    Available Help at Discharge Family    Type of [place] to enter    Entrance Stairs-Number of Steps 3    Entrance Stairs-[place] Two level;Able to live on main level with bedroom/bathroom    [place] - single point      Prior Function   Level of Independence Independent    Leisure Pt enjoys walking at home, pain limits standing at shopping/church activities      Observation/Other Assessments   Focus on Therapeutic Outcomes (FOTO)  NA      Posture/Postural Control   Posture/Postural Control Postural limitations    Postural Limitations Rounded Shoulders;Forward head;Weight shift right   Flexed knees upon standing     ROM / Strength   AROM / PROM / Strength AROM;Strength;PROM      AROM   Overall AROM  Deficits    Overall AROM Comments Limitations with full knee extension RLE; limitations to neutral ankle dflex bilat.      PROM   Overall PROM Comments R hamstrings lack 26 degrees, L lacks 22 degrees full extension      Strength   Overall Strength Deficits    Strength Assessment Site Hip;Knee;Ankle    Right/Left Hip Right;Left    Right Hip Flexion 4/5    Right Hip Extension 3-/5    Right Hip ABduction 3-/5    Left Hip Flexion 4+/5    Left Hip Extension 3/5    Left Hip ABduction 3+/5    Right/Left Knee Right;Left    Right Knee  Flexion 3+/5  Right Knee Extension 3+/5    Left Knee Flexion 3+/5    Left Knee Extension 4/5    Right/Left Ankle Right;Left    Right Ankle Dorsiflexion 3/5    Left Ankle Dorsiflexion 3/5      Transfers   Transfers Stand to Sit;Sit to Stand    Sit to Stand 5: Supervision;Without upper extremity assist;From chair/3-in-1    Five time sit to stand comments  24.81    Stand to Sit 5: Supervision;Without upper extremity assist;To chair/3-in-1    Comments Pt comes up to stand with decreased forward trunk lean, knees are anterior to toes, knees stay flexed upon standing      Ambulation/Gait   Ambulation/Gait Yes    Ambulation/Gait Assistance 5: Supervision    Ambulation Distance (Feet) 150 Feet    Assistive device None    Gait Pattern Step-through pattern;Decreased step length - right;Decreased step length - left;Decreased dorsiflexion - right;Decreased dorsiflexion - left;Right flexed knee in stance;Left flexed knee in stance;Decreased trunk rotation;Narrow base of support    Gait velocity 15.16 sec =2.16 ft/sec      Standardized Balance Assessment   Standardized Balance Assessment Timed Up and Go Test      Timed Up and Go Test   Normal TUG (seconds) 16.9    Cognitive TUG (seconds) 23.56    TUG Comments Scores >13.5-15 sec indicate increased fall risk; > 10%difference indicates difficulty with dual tasking                        Objective measurements completed on examination: See above findings.               PT Education - 08/11/20 1541     Education Details HEP initiated-see instructions    Person(s) Educated Patient    Methods Explanation;Demonstration;Handout    Comprehension Verbalized understanding;Returned demonstration              PT Short Term Goals - 08/11/20 1551       PT SHORT TERM GOAL #1   Title Pt will be independent with HEP for decreased pain and improved functional strength, transfers, balance,  and gait.  TARGET 09/05/2020     Time 4    Period Weeks    Status New      PT SHORT TERM GOAL #2   Title Pt will improve 5x sit<>stand to less than or equal to 20 seconds for improved functional lower extremity strength.    Baseline 24.81 sec    Time 4    Period Weeks    Status New      PT SHORT TERM GOAL #3   Title Pt will improve R hamstring ROM by at least 10 degrees for improved flexibility, decreased RLE pain.    Baseline -26 degrees RLE, -22 degrees LLE    Time 4    Period Weeks    Status New      PT SHORT TERM GOAL #4   Title Pt will verbalize/demo at least 3 means to decrease pain in bilat calves and improve stability/decreased pain upon standing.    Baseline 8/10 at worst    Time 4    Period Weeks    Status New               PT Long Term Goals - 08/11/20 1553       PT LONG TERM GOAL #1   Title Pt will be independent with progression of HEP for decreased  pain, improved mobility, strength, balance.  TARGET for all LTGS: 10/03/2020    Time 8    Period Weeks    Status New      PT LONG TERM GOAL #2   Title Pt will improve 5x sit<>stand to less than or equal to 15 seconds for improved functional strength.    Time 8    Period Weeks    Status New      PT LONG TERM GOAL #3   Title Pt will improve gait velocity to at least 2.62 ft/sec for improved gait efficiency and safety.    Baseline 2.16 ft/sec    Time 8    Period Weeks    Status New      PT LONG TERM GOAL #4   Title Pt will imrpove TUG score and TUG cog score to less than or equal to 10% difference for improved dual tasking with gait.    Baseline 16.9, 23.56    Time 8    Period Weeks    Status New      PT LONG TERM GOAL #5   Title Pt will ambulate at least 15-20 minutes with appropriate standing rest breaks, without c/o increased pain or fatigue for improved gait indpedence in her neighborhood.    Baseline 10 minutes before having to stop    Time 8    Period Weeks    Status New                    Plan - 08/11/20  1543     Clinical Impression Statement Pt is an 80 year old female who presents to OPPT with history of Parkinson's disease, with muscle pain and weakness in BLEs, that has been going on for > 1 month.  Pain and weakness is interering with transfers, gait, and stability of her functional mobility.  She presents today with decreased functional strength, decreased flexibility of lower extremities (gastroc and hamstrings), abnormal posture, decreased timing and coordiantion of gait, decreased balance, slowed overall gait velcoity and difficulty with dual tasking with gait.  Pt's prolonged flexed posture in standing and gait may be contributing to forces on lower extremities causing calf pain and decreased functional mobility.  She has history of back pain, but denies any radiating symptoms, no specific symptoms noted per resisted MMT that would indicate specific back involvement.  Pt would benefit from skilled PT to address the above stated deficits to decrease fall risk and improve overall funcitonal mobility.    Personal Factors and Comorbidities Comorbidity 3+    Comorbidities asthma, GERD, breast cancer, HTN, cevical spondylosis, lumbar radiculopathy, OA of bilat knees, hx of donating kidney to husband    Examination-Activity Limitations Locomotion Level;Transfers;Stand    Examination-Participation Restrictions Church;Community Activity;Meal Prep    Stability/Clinical Decision Making Evolving/Moderate complexity    Clinical Decision Making Moderate    Rehab Potential Good    PT Frequency 2x / week    PT Duration 8 weeks   including eval week   PT Treatment/Interventions ADLs/Self Care Home Management;Aquatic Therapy;DME Instruction;Neuromuscular re-education;Balance training;Therapeutic exercise;Therapeutic activities;Functional mobility training;Gait training;Patient/family education;Manual techniques;Passive range of motion    PT Next Visit Plan Review initial HEP and progress HEP for stretching,  strengthening of hips/gluts/quads, gastroc stretching and ankle strength; use of seated stepper; gait training for widened BOS and improved posture, gait mechanics; may consider/discuss aquatics for patient    Consulted and Agree with Plan of Care Patient  Patient will benefit from skilled therapeutic intervention in order to improve the following deficits and impairments:  Abnormal gait, Decreased range of motion, Pain, Decreased balance, Impaired flexibility, Decreased mobility, Decreased strength, Postural dysfunction  Visit Diagnosis: Pain in left lower leg  Pain in right lower leg  Muscle weakness (generalized)  Abnormal posture  Other abnormalities of gait and mobility  Unsteadiness on feet     Problem List Patient Active Problem List   Diagnosis Date Noted   Primary osteoarthritis of right knee 05/12/2020   Primary osteoarthritis of left knee 05/12/2020   Hyperlipidemia    Bilateral calf pain 03/05/2019   Genetic testing 02/13/2019   Family history of ovarian cancer    Family history of bladder cancer    Family history of colon cancer    Family history of leukemia    Lumbar radiculopathy 12/08/2018   Myofascial pain 12/08/2018   Impaired gait and mobility 12/08/2018   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 08/11/2020, 3:58 PM Frazier Butt., PT  [facility] [address] [place], [place], [address] Phone: [phone]   Fax:  [phone]  Name: April Church MRN: [id number] Date of Birth: [birth year]

## 2020-08-14 ENCOUNTER — Ambulatory Visit: Payer: Medicare Other | Admitting: Physical Therapy

## 2020-08-14 ENCOUNTER — Other Ambulatory Visit: Payer: Self-pay

## 2020-08-14 DIAGNOSIS — M6281 Muscle weakness (generalized): Secondary | ICD-10-CM | POA: Diagnosis not present

## 2020-08-14 DIAGNOSIS — M79662 Pain in left lower leg: Secondary | ICD-10-CM | POA: Diagnosis not present

## 2020-08-14 DIAGNOSIS — M79661 Pain in right lower leg: Secondary | ICD-10-CM

## 2020-08-14 DIAGNOSIS — R293 Abnormal posture: Secondary | ICD-10-CM

## 2020-08-14 DIAGNOSIS — R2689 Other abnormalities of gait and mobility: Secondary | ICD-10-CM

## 2020-08-14 DIAGNOSIS — R2681 Unsteadiness on feet: Secondary | ICD-10-CM | POA: Diagnosis not present

## 2020-08-14 NOTE — Patient Instructions (Addendum)
Access Code: [email or web address] URL: [email or web address] Date: 08/14/2020 Prepared by: Mady Haagensen  Exercises Supine Bridge - 1 x daily - 5 x weekly - 1 sets - 10 reps Supine Quadricep Sets - 1 x daily - 5 x weekly - 1 sets - 10 reps - 3 sec hold Seated Hamstring Stretch - 1 x daily - 5 x weekly - 1 sets - 3 reps - 15 sec hold  Added 08/14/2020 (to perform these exercises after prolonged sitting prior to standing) Seated March - 1-2 x daily - 5 x weekly - 1-2 sets - 10 reps Seated Long Arc Quad - 1-2 x daily - 5 x weekly - 1-2 sets - 10 reps Seated Ankle Dorsiflexion AROM - 1-2 x daily - 7 x weekly - 1-2 sets - 10 reps  Wrote in standing with wider BOS and lateral weightshift upon standing

## 2020-08-14 NOTE — Therapy (Signed)
[facility] [address] [place], [place], [address] Phone: [phone]   Fax:  [phone]  Physical Therapy Treatment  Patient Details  Name: April Church MRN: [id number] Date of Birth: [birth year] Referring Provider (PT): Dr. Courtney Heys   Encounter Date: 08/14/2020   PT End of Session - 08/14/20 1102     Visit Number 2    Number of Visits 16    Date for PT Re-Evaluation 10/03/20    Authorization Type Medicare    Progress Note Due on Visit 10    PT Start Time 1019    PT Stop Time 1102    PT Time Calculation (min) 43 min    Activity Tolerance Patient tolerated treatment well;No increased pain   Pt reports slightly less pain at end of session   Behavior During Therapy [facility] for tasks assessed/performed             Past Medical History:  Diagnosis Date   Asthma    daily inhaler   Breast cancer ([place])    left- radiation and surgery -dx. 2011- no further tx. now- [name] , [name]   Cyst of finger 11/2011   annular cyst right long finger   Dental crowns present    Dermatitis    Family history of bladder cancer    Family history of colon cancer    Family history of leukemia    Family history of ovarian cancer    Frequency of urination    GERD (gastroesophageal reflux disease)    Hemorrhoid    Hyperlipidemia    Hypertension    under control, has been on med. x 2 yrs.   PONV (postoperative nausea and vomiting)    Seasonal allergies    Tremors of nervous system    hands   Trigger finger of right hand 11/2011   long finger    Past Surgical History:  Procedure Laterality Date   ANTERIOR CERVICAL DECOMPRESSION/DISCECTOMY FUSION 4 LEVELS N/A 11/14/2013   Procedure: ANTERIOR CERVICAL DECOMPRESSION/DISCECTOMY FUSION 4 LEVELS;  Surgeon: Newman Pies, MD;  Location: [facility];  Service: Neurosurgery;  Laterality: N/A;  C34 C45 C56 C67 anterior cervical fusion with interbody prosthesis plating  and  bonegraft   APPENDECTOMY  age 65   BREAST LUMPECTOMY  06/16/2009   left; SLN bx.   BREAST LUMPECTOMY  07/01/2009   re-excision   BREAST SURGERY  1999   reduction   CATARACT EXTRACTION, BILATERAL     COLONOSCOPY WITH PROPOFOL N/A 12/03/2014   Procedure: COLONOSCOPY WITH PROPOFOL;  Surgeon: [name], MD;  Location: [facility];  Service: Endoscopy;  Laterality: N/A;   FOOT SURGERY  06/22/11   left   KNEE ARTHROSCOPY  03/12/2005   right   KNEE ARTHROSCOPY     left   NASAL SINUS SURGERY     x 2   NEPHRECTOMY LIVING DONOR Left 04/2008   donated to spouse 2010([facility])   TRIGGER FINGER RELEASE  12/16/2011   Procedure: RELEASE TRIGGER FINGER/A-1 PULLEY;  Surgeon: Tennis Must, MD;  Location: [facility];  Service: Orthopedics;  Laterality: Right;  RIGHT LONG FINGER TRIGGER RELEASE & ANNULAR CYST EXCISION   TUMOR EXCISION  age 1   right arm    There were no vitals filed for this visit.   Subjective Assessment - 08/14/20 1024     Subjective Pain is not too bad this morning.  Have tried my exercises without too much difficulty.  Limitations Walking;Standing    How long can you stand comfortably? < 5 minutes    How long can you walk comfortably? 10 minutes or less    Patient Stated Goals Pt's goals for therapy are to get rid of pain, and improve strength.    Currently in Pain? Yes    Pain Score 5     Pain Location Calf    Pain Orientation Right;Left;Lower    Pain Descriptors / Indicators Aching;Tightness    Pain Type Chronic pain    Pain Onset More than a month ago    Pain Frequency Intermittent    Aggravating Factors  worse when standing, hurts to go up stairs    Pain Relieving Factors when I start moving                Access Code: [email or web address] URL: [email or web address] Date: 08/14/2020 Prepared by: Mady Haagensen  Exercises-Reviewed as HEP given last visit, with pt return demo understanding.  Supine Bridge - 1 x daily - 5 x  weekly - 1 sets - 10 reps Supine Quadricep Sets - 1 x daily - 5 x weekly - 1 sets - 10 reps - 3 sec hold Seated Hamstring Stretch - 1 x daily - 5 x weekly - 1 sets - 3 reps - 15 sec hold               [facility] Adult PT Treatment/Exercise - 08/14/20 0001       Transfers   Transfers Stand to Sit;Sit to Stand    Sit to Stand 5: Supervision;Without upper extremity assist;From chair/3-in-1;From bed    Sit to Stand Details (indicate cue type and reason) Cues to widen BOS for better balacne upon standing    Stand to Sit 5: Supervision;Without upper extremity assist;To chair/3-in-1    Transfer Cueing Cues for widened BOS; upon standing cues for gentle lateral weighshifting to prepare for intiating gait.    Comments Discussed/showed pt exercises as part of HEP to perform in sitting prior to standing (after prolonged sitting).      Ambulation/Gait   Ambulation/Gait Yes    Ambulation/Gait Assistance 5: Supervision    Ambulation Distance (Feet) 25 Feet   x 2, 80 ft   Assistive device None    Gait Pattern Step-through pattern;Decreased step length - right;Decreased step length - left;Decreased dorsiflexion - right;Decreased dorsiflexion - left;Right flexed knee in stance;Left flexed knee in stance;Decreased trunk rotation;Narrow base of support    Gait Comments Cues for terminal knee extension upon standing and cues for upright posture/knee extension in standing.      Exercises   Exercises Knee/Hip;Ankle      Knee/Hip Exercises: Stretches   Passive Hamstring Stretch Right;Left;3 reps;Limitations    Passive Hamstring Stretch Limitations Passive stretch, contract relax in 90/90 position, 3 reps, 10 sec hold each.    Hip Flexor Stretch Left;5 reps;10 seconds    Hip Flexor Stretch Limitations In sideyling position, passive hip flexion stretch    Other Knee/Hip Stretches Passive gastroc stretch, knee bent, pt in supine, 5 reps 10 sec hold.      Knee/Hip Exercises: Aerobic   Nustep Level 1, 4  extremities, x 5 minutes, with steps/min >40 for flexibility and strength.  Cues given to fully extend at knees.      Knee/Hip Exercises: Seated   Long Arc Quad Right;Left;1 set;5 reps    Other Seated Knee/Hip Exercises SEated foot props, kicking leg out and heel on ground, 10 reps each side.  Other Seated Knee/Hip Exercises Seated singl leg side step out and in, 8 reps each side.    Marching AROM;Right;Left;1 set;10 reps      Knee/Hip Exercises: Supine   Short Arc Quad Sets Strengthening;Right;Left;10 reps;2 sets      Knee/Hip Exercises: Sidelying   Hip ABduction Strengthening;Right;Left;1 set;10 reps;Limitations    Hip ABduction Limitations With R hip abduction, pt compensates with hip flexion, PT assists with positioning.      Ankle Exercises: Seated   Heel Raises Both;10 reps    Toe Raise 10 reps   BLEs                   PT Education - 08/14/20 1247     Education Details Additions to HEP-see instructions    Person(s) Educated Patient    Methods Explanation;Demonstration;Handout    Comprehension Verbalized understanding;Returned demonstration              PT Short Term Goals - 08/11/20 1551       PT SHORT TERM GOAL #1   Title Pt will be independent with HEP for decreased pain and improved functional strength, transfers, balance,  and gait.  TARGET 09/05/2020    Time 4    Period Weeks    Status New      PT SHORT TERM GOAL #2   Title Pt will improve 5x sit<>stand to less than or equal to 20 seconds for improved functional lower extremity strength.    Baseline 24.81 sec    Time 4    Period Weeks    Status New      PT SHORT TERM GOAL #3   Title Pt will improve R hamstring ROM by at least 10 degrees for improved flexibility, decreased RLE pain.    Baseline -26 degrees RLE, -22 degrees LLE    Time 4    Period Weeks    Status New      PT SHORT TERM GOAL #4   Title Pt will verbalize/demo at least 3 means to decrease pain in bilat calves and improve  stability/decreased pain upon standing.    Baseline 8/10 at worst    Time 4    Period Weeks    Status New               PT Long Term Goals - 08/11/20 1553       PT LONG TERM GOAL #1   Title Pt will be independent with progression of HEP for decreased pain, improved mobility, strength, balance.  TARGET for all LTGS: 10/03/2020    Time 8    Period Weeks    Status New      PT LONG TERM GOAL #2   Title Pt will improve 5x sit<>stand to less than or equal to 15 seconds for improved functional strength.    Time 8    Period Weeks    Status New      PT LONG TERM GOAL #3   Title Pt will improve gait velocity to at least 2.62 ft/sec for improved gait efficiency and safety.    Baseline 2.16 ft/sec    Time 8    Period Weeks    Status New      PT LONG TERM GOAL #4   Title Pt will imrpove TUG score and TUG cog score to less than or equal to 10% difference for improved dual tasking with gait.    Baseline 16.9, 23.56    Time 8    Period Weeks  Status New      PT LONG TERM GOAL #5   Title Pt will ambulate at least 15-20 minutes with appropriate standing rest breaks, without c/o increased pain or fatigue for improved gait indpedence in her neighborhood.    Baseline 10 minutes before having to stop    Time 8    Period Weeks    Status New                   Plan - 08/14/20 1248     Clinical Impression Statement Skilled PT session focused on stretching, strengthening exercises in varied positions.  Updated HEP to address seated exercises to lessen stiffness after prolonged sitting.  Pt continues with significant tightness in hamstrings and gastrocs, also with sidelying position, PT notes tightness in hip flexors.  Pt reports slightly less pain at end of session, and pt will continue to benefit from further skilled PT to address muscle tightness, stiffness, weakness for overall improved functional mobility.    Personal Factors and Comorbidities Comorbidity 3+    Comorbidities  asthma, GERD, breast cancer, HTN, cevical spondylosis, lumbar radiculopathy, OA of bilat knees, hx of donating kidney to husband    Examination-Activity Limitations Locomotion Level;Transfers;Stand    Examination-Participation Restrictions Church;Community Activity;Meal Prep    Stability/Clinical Decision Making Evolving/Moderate complexity    Rehab Potential Good    PT Frequency 2x / week    PT Duration 8 weeks   including eval week   PT Treatment/Interventions ADLs/Self Care Home Management;Aquatic Therapy;DME Instruction;Neuromuscular re-education;Balance training;Therapeutic exercise;Therapeutic activities;Functional mobility training;Gait training;Patient/family education;Manual techniques;Passive range of motion    PT Next Visit Plan Review updates to HEP and progress HEP for stretching, strengthening of hips/gluts/quads, gastroc stretching and ankle strength; use of seated stepper; gait training for widened BOS and improved posture, gait mechanics; may consider/discuss aquatics for patient.  Make sure to work on wider BOS upon standing with lateral weigthshifting    Consulted and Agree with Plan of Care Patient             Patient will benefit from skilled therapeutic intervention in order to improve the following deficits and impairments:  Abnormal gait, Decreased range of motion, Pain, Decreased balance, Impaired flexibility, Decreased mobility, Decreased strength, Postural dysfunction  Visit Diagnosis: Abnormal posture  Muscle weakness (generalized)  Pain in right lower leg  Pain in left lower leg  Other abnormalities of gait and mobility     Problem List Patient Active Problem List   Diagnosis Date Noted   Primary osteoarthritis of right knee 05/12/2020   Primary osteoarthritis of left knee 05/12/2020   Hyperlipidemia    Bilateral calf pain 03/05/2019   Genetic testing 02/13/2019   Family history of ovarian cancer    Family history of bladder cancer    Family  history of colon cancer    Family history of leukemia    Lumbar radiculopathy 12/08/2018   Myofascial pain 12/08/2018   Impaired gait and mobility 12/08/2018   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   Chronic nonallergic rhinitis 12/29/2016   Mild persistent asthma, uncomplicated 08/18/9483   Vocal fold paralysis, 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   Gastroesophageal reflux disease 12/29/2016   Cervical spondylosis with myelopathy and radiculopathy 11/14/2013   Essential tremor 06/13/2013   Cervical spondylosis without myelopathy 06/13/2013   Breast cancer of lower-outer quadrant of left female breast ([place]) 09/10/2010    April Church W. 08/14/2020, 12:52 PM Frazier Butt., PT  [facility] [address] [place], [place], [address]  Phone: [phone]   Fax:  [phone]  Name: April Church MRN: [id number] Date of Birth: Jul 24, 1940

## 2020-08-18 ENCOUNTER — Ambulatory Visit: Payer: Medicare Other | Admitting: Physical Therapy

## 2020-08-18 ENCOUNTER — Other Ambulatory Visit: Payer: Self-pay

## 2020-08-18 DIAGNOSIS — R293 Abnormal posture: Secondary | ICD-10-CM | POA: Diagnosis not present

## 2020-08-18 DIAGNOSIS — R2689 Other abnormalities of gait and mobility: Secondary | ICD-10-CM | POA: Diagnosis not present

## 2020-08-18 DIAGNOSIS — M79661 Pain in right lower leg: Secondary | ICD-10-CM | POA: Diagnosis not present

## 2020-08-18 DIAGNOSIS — R2681 Unsteadiness on feet: Secondary | ICD-10-CM | POA: Diagnosis not present

## 2020-08-18 DIAGNOSIS — M6281 Muscle weakness (generalized): Secondary | ICD-10-CM

## 2020-08-18 DIAGNOSIS — M79662 Pain in left lower leg: Secondary | ICD-10-CM | POA: Diagnosis not present

## 2020-08-18 NOTE — Therapy (Signed)
[facility] [address] [place], [place], [address] Phone: [phone]   Fax:  [phone]  Physical Therapy Treatment  Patient Details  Name: April Church MRN: [id number] Date of Birth: Jun 07, 1940 Referring Provider (PT): Dr. Courtney Heys   Encounter Date: 08/18/2020   PT End of Session - 08/18/20 0805     Visit Number 3    Number of Visits 16    Date for PT Re-Evaluation 10/03/20    Authorization Type Medicare    Progress Note Due on Visit 10    PT Start Time 0803    PT Stop Time 0845    PT Time Calculation (min) 42 min    Activity Tolerance Patient tolerated treatment well;No increased pain   Pt reports slightly less pain at end of session   Behavior During Therapy [facility] for tasks assessed/performed             Past Medical History:  Diagnosis Date   Asthma    daily inhaler   Breast cancer ([place])    left- radiation and surgery -dx. 2011- no further tx. now- [name] , [name]   Cyst of finger 11/2011   annular cyst right long finger   Dental crowns present    Dermatitis    Family history of bladder cancer    Family history of colon cancer    Family history of leukemia    Family history of ovarian cancer    Frequency of urination    GERD (gastroesophageal reflux disease)    Hemorrhoid    Hyperlipidemia    Hypertension    under control, has been on med. x 2 yrs.   PONV (postoperative nausea and vomiting)    Seasonal allergies    Tremors of nervous system    hands   Trigger finger of right hand 11/2011   long finger    Past Surgical History:  Procedure Laterality Date   ANTERIOR CERVICAL DECOMPRESSION/DISCECTOMY FUSION 4 LEVELS N/A 11/14/2013   Procedure: ANTERIOR CERVICAL DECOMPRESSION/DISCECTOMY FUSION 4 LEVELS;  Surgeon: Newman Pies, MD;  Location: [facility];  Service: Neurosurgery;  Laterality: N/A;  C34 C45 C56 C67 anterior cervical fusion with interbody prosthesis plating  and  bonegraft   APPENDECTOMY  age 27   BREAST LUMPECTOMY  06/16/2009   left; SLN bx.   BREAST LUMPECTOMY  07/01/2009   re-excision   BREAST SURGERY  1999   reduction   CATARACT EXTRACTION, BILATERAL     COLONOSCOPY WITH PROPOFOL N/A 12/03/2014   Procedure: COLONOSCOPY WITH PROPOFOL;  Surgeon: [name], MD;  Location: [facility];  Service: Endoscopy;  Laterality: N/A;   FOOT SURGERY  06/22/11   left   KNEE ARTHROSCOPY  03/12/2005   right   KNEE ARTHROSCOPY     left   NASAL SINUS SURGERY     x 2   NEPHRECTOMY LIVING DONOR Left 04/2008   donated to spouse 2010([facility])   TRIGGER FINGER RELEASE  12/16/2011   Procedure: RELEASE TRIGGER FINGER/A-1 PULLEY;  Surgeon: Tennis Must, MD;  Location: [facility];  Service: Orthopedics;  Laterality: Right;  RIGHT LONG FINGER TRIGGER RELEASE & ANNULAR CYST EXCISION   TUMOR EXCISION  age 67   right arm    There were no vitals filed for this visit.   Subjective Assessment - 08/18/20 0805     Subjective Pain is better, and didn't stop me from doing stuff.  It's still there.  Pt reports  soreness, but that's probably from the exercises.  No falls.    Limitations Walking;Standing    How long can you stand comfortably? < 5 minutes    How long can you walk comfortably? 10 minutes or less    Patient Stated Goals Pt's goals for therapy are to get rid of pain, and improve strength.    Currently in Pain? Yes    Pain Location Calf    Pain Orientation Right;Left;Lower    Pain Descriptors / Indicators Aching;Tightness    Pain Type Chronic pain    Pain Onset More than a month ago    Pain Frequency Intermittent    Aggravating Factors  worse wtih standing, stairs    Pain Relieving Factors when I start moving                 Reviewed HEP additions from 08/14/2020 (to perform these exercises after prolonged sitting prior to standing).  Pt return demo understanding.  Seated March - 1-2 x daily - 5 x weekly - 1-2 sets - 10  reps Seated Long Arc Quad - 1-2 x daily - 5 x weekly - 1-2 sets - 10 reps Seated Ankle Dorsiflexion AROM - 1-2 x daily - 7 x weekly - 1-2 sets - 10 reps               [facility] Adult PT Treatment/Exercise - 08/18/20 0001       Knee/Hip Exercises: Stretches   Passive Hamstring Stretch Right;Left;3 reps;Limitations    Passive Hamstring Stretch Limitations Passive stretch, contract relax in 90/90 position, 3 reps, 10 sec hold each.    Hip Flexor Stretch Left;5 reps;10 seconds    Hip Flexor Stretch Limitations In sideyling position, passive hip flexion stretch    Gastroc Stretch Right;3 reps;20 seconds    Gastroc Stretch Limitations Foot propped at 2" block.  Cues for technique    Other Knee/Hip Stretches Passive gastroc stretch, knee bent, pt in supine, 5 reps 10 sec hold.  Knee straight, 5 reps, 10 sec hold.    Other Knee/Hip Stretches Hip flexor stretch, supine at ege of mat, 2 reps 30 sec      Knee/Hip Exercises: Aerobic   Nustep Level 1, 4 extremities, x 5 minutes, with steps/min >40 for flexibility and strength.  Cues given to fully extend at knees.      Knee/Hip Exercises: Standing   Functional Squat 1 set;5 reps    Functional Squat Limitations Gentle minisquat poistion at edge of mat, cues for technique and to activate gluts/quads upon standing    Other Standing Knee Exercises Lateral weightshift side to side, 5 reps cues for terminal knee extension upon weightshift      Knee/Hip Exercises: Seated   Other Seated Knee/Hip Exercises SEated foot props, kicking leg out and heel on ground, 10 reps each side.      Knee/Hip Exercises: Supine   Short Arc Quad Sets Strengthening;Right;Left;10 reps;2 sets    Bridges Strengthening;Both;2 sets;10 reps      Knee/Hip Exercises: Sidelying   Hip ABduction Strengthening;Right;Left;10 reps;Limitations;2 sets    Hip ABduction Limitations With R hip abduction, pt compensates with hip flexion, PT assists with positioning.                       PT Short Term Goals - 08/11/20 1551       PT SHORT TERM GOAL #1   Title Pt will be independent with HEP for decreased pain and improved functional strength,  transfers, balance,  and gait.  TARGET 09/05/2020    Time 4    Period Weeks    Status New      PT SHORT TERM GOAL #2   Title Pt will improve 5x sit<>stand to less than or equal to 20 seconds for improved functional lower extremity strength.    Baseline 24.81 sec    Time 4    Period Weeks    Status New      PT SHORT TERM GOAL #3   Title Pt will improve R hamstring ROM by at least 10 degrees for improved flexibility, decreased RLE pain.    Baseline -26 degrees RLE, -22 degrees LLE    Time 4    Period Weeks    Status New      PT SHORT TERM GOAL #4   Title Pt will verbalize/demo at least 3 means to decrease pain in bilat calves and improve stability/decreased pain upon standing.    Baseline 8/10 at worst    Time 4    Period Weeks    Status New               PT Long Term Goals - 08/11/20 1553       PT LONG TERM GOAL #1   Title Pt will be independent with progression of HEP for decreased pain, improved mobility, strength, balance.  TARGET for all LTGS: 10/03/2020    Time 8    Period Weeks    Status New      PT LONG TERM GOAL #2   Title Pt will improve 5x sit<>stand to less than or equal to 15 seconds for improved functional strength.    Time 8    Period Weeks    Status New      PT LONG TERM GOAL #3   Title Pt will improve gait velocity to at least 2.62 ft/sec for improved gait efficiency and safety.    Baseline 2.16 ft/sec    Time 8    Period Weeks    Status New      PT LONG TERM GOAL #4   Title Pt will imrpove TUG score and TUG cog score to less than or equal to 10% difference for improved dual tasking with gait.    Baseline 16.9, 23.56    Time 8    Period Weeks    Status New      PT LONG TERM GOAL #5   Title Pt will ambulate at least 15-20 minutes with appropriate standing rest  breaks, without c/o increased pain or fatigue for improved gait indpedence in her neighborhood.    Baseline 10 minutes before having to stop    Time 8    Period Weeks    Status New                   Plan - 08/18/20 1610     Clinical Impression Statement Continued skilled PT session focused on stretching, strengthening exercises in varied positions.  Pt continues with tightness in hamstrings, gastrocs, and hip flexors.  With active and passive exercises as well as brief aerobic exercise for flexibility, pt notes decreased pain at end of session.  She will continue to benefit from further skilled PT to address flexibility, strength, gait for improved overall functional mobility.    Personal Factors and Comorbidities Comorbidity 3+    Comorbidities asthma, GERD, breast cancer, HTN, cevical spondylosis, lumbar radiculopathy, OA of bilat knees, hx of donating kidney to husband  Examination-Activity Limitations Locomotion Level;Transfers;Stand    Examination-Participation Restrictions Church;Community Activity;Meal Prep    PT Frequency 2x / week    PT Duration 8 weeks   including eval week   PT Treatment/Interventions ADLs/Self Care Home Management;Aquatic Therapy;DME Instruction;Neuromuscular re-education;Balance training;Therapeutic exercise;Therapeutic activities;Functional mobility training;Gait training;Patient/family education;Manual techniques;Passive range of motion    PT Next Visit Plan Progress HEP for stretching, strengthening of hips/gluts/quads, gastroc stretching and ankle strength; use of seated stepper; gait training for widened BOS and improved posture, gait mechanics; may consider/discuss aquatics for patient.  Make sure to work on wider BOS upon standing with lateral weigthshifting    Consulted and Agree with Plan of Care Patient             Patient will benefit from skilled therapeutic intervention in order to improve the following deficits and impairments:      Visit Diagnosis: Muscle weakness (generalized)  Abnormal posture  Pain in left lower leg  Pain in right lower leg     Problem List Patient Active Problem List   Diagnosis Date Noted   Primary osteoarthritis of right knee 05/12/2020   Primary osteoarthritis of left knee 05/12/2020   Hyperlipidemia    Bilateral calf pain 03/05/2019   Genetic testing 02/13/2019   Family history of ovarian cancer    Family history of bladder cancer    Family history of colon cancer    Family history of leukemia    Lumbar radiculopathy 12/08/2018   Myofascial pain 12/08/2018   Impaired gait and mobility 12/08/2018   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   Chronic nonallergic rhinitis 12/29/2016   Mild persistent asthma, uncomplicated 51/70/0174   Vocal fold paralysis, 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   Gastroesophageal reflux disease 12/29/2016   Cervical spondylosis with myelopathy and radiculopathy 11/14/2013   Essential tremor 06/13/2013   Cervical spondylosis without myelopathy 06/13/2013   Breast cancer of lower-outer quadrant of left female breast ([place]) 09/10/2010    Demonie Kassa W. 08/18/2020, 8:46 AM Frazier Butt., PT   [facility] [address] [place], [place], [address] Phone: [phone]   Fax:  [phone]  Name: April Church MRN: [id number] Date of Birth: 1941/01/19

## 2020-08-19 ENCOUNTER — Ambulatory Visit: Payer: Medicare Other | Admitting: Physical Therapy

## 2020-08-21 ENCOUNTER — Other Ambulatory Visit: Payer: Self-pay

## 2020-08-21 ENCOUNTER — Ambulatory Visit: Payer: Medicare Other | Admitting: Physical Therapy

## 2020-08-21 DIAGNOSIS — R293 Abnormal posture: Secondary | ICD-10-CM | POA: Diagnosis not present

## 2020-08-21 DIAGNOSIS — M79662 Pain in left lower leg: Secondary | ICD-10-CM

## 2020-08-21 DIAGNOSIS — M79661 Pain in right lower leg: Secondary | ICD-10-CM

## 2020-08-21 DIAGNOSIS — R2689 Other abnormalities of gait and mobility: Secondary | ICD-10-CM | POA: Diagnosis not present

## 2020-08-21 DIAGNOSIS — M6281 Muscle weakness (generalized): Secondary | ICD-10-CM | POA: Diagnosis not present

## 2020-08-21 DIAGNOSIS — R2681 Unsteadiness on feet: Secondary | ICD-10-CM | POA: Diagnosis not present

## 2020-08-21 NOTE — Therapy (Signed)
April Church [address] [place], [place], [address] Phone: [phone]   Fax:  [phone]  Physical Therapy Treatment  Patient Details  Name: April Church MRN: [id number] Date of Birth: June 15, 1940 Referring Provider (PT): Dr. Courtney Heys   Encounter Date: 08/21/2020   PT End of Session - 08/21/20 1323     Visit Number 4    Number of Visits 16    Date for PT Re-Evaluation 10/03/20    Authorization Type Medicare    Progress Note Due on Visit 10    PT Start Time 1320    PT Stop Time 3825    PT Time Calculation (min) 39 min    Activity Tolerance Patient tolerated treatment well;No increased pain   Pt reports pain at end of session as 1/10   Behavior During Therapy [facility] for tasks assessed/performed             Past Medical History:  Diagnosis Date   Asthma    daily inhaler   Breast cancer ([place])    left- radiation and surgery -dx. 2011- no further tx. now- [name] , [name]   Cyst of finger 11/2011   annular cyst right long finger   Dental crowns present    Dermatitis    Family history of bladder cancer    Family history of colon cancer    Family history of leukemia    Family history of ovarian cancer    Frequency of urination    GERD (gastroesophageal reflux disease)    Hemorrhoid    Hyperlipidemia    Hypertension    under control, has been on med. x 2 yrs.   PONV (postoperative nausea and vomiting)    Seasonal allergies    Tremors of nervous system    hands   Trigger finger of right hand 11/2011   long finger    Past Surgical History:  Procedure Laterality Date   ANTERIOR CERVICAL DECOMPRESSION/DISCECTOMY FUSION 4 LEVELS N/A 11/14/2013   Procedure: ANTERIOR CERVICAL DECOMPRESSION/DISCECTOMY FUSION 4 LEVELS;  Surgeon: Newman Pies, MD;  Location: [facility];  Service: Neurosurgery;  Laterality: N/A;  C34 C45 C56 C67 anterior cervical fusion with interbody prosthesis plating and   bonegraft   APPENDECTOMY  age 80   BREAST LUMPECTOMY  06/16/2009   left; SLN bx.   BREAST LUMPECTOMY  07/01/2009   re-excision   BREAST SURGERY  1999   reduction   CATARACT EXTRACTION, BILATERAL     COLONOSCOPY WITH PROPOFOL N/A 12/03/2014   Procedure: COLONOSCOPY WITH PROPOFOL;  Surgeon: [name], MD;  Location: [facility];  Service: Endoscopy;  Laterality: N/A;   FOOT SURGERY  06/22/11   left   KNEE ARTHROSCOPY  03/12/2005   right   KNEE ARTHROSCOPY     left   NASAL SINUS SURGERY     x 2   NEPHRECTOMY LIVING DONOR Left 04/2008   donated to spouse 2010([facility])   TRIGGER FINGER RELEASE  12/16/2011   Procedure: RELEASE TRIGGER FINGER/A-1 PULLEY;  Surgeon: Tennis Must, MD;  Location: [facility];  Service: Orthopedics;  Laterality: Right;  RIGHT LONG FINGER TRIGGER RELEASE & ANNULAR CYST EXCISION   TUMOR EXCISION  age 80   right arm    There were no vitals filed for this visit.   Subjective Assessment - 08/21/20 1320     Subjective Pain is a little better; still there.  Trying to remember to do the things you've told  me once I stand up and that helps.    Limitations Walking;Standing    How long can you stand comfortably? < 5 minutes    How long can you walk comfortably? 10 minutes or less    Patient Stated Goals Pt's goals for therapy are to get rid of pain, and improve strength.    Currently in Pain? Yes    Pain Score 4     Pain Location Calf    Pain Orientation Right;Left;Lower    Pain Descriptors / Indicators Aching;Tightness    Pain Type Chronic pain    Pain Onset More than a month ago    Pain Frequency Intermittent    Aggravating Factors  worse with standing, stairs    Pain Relieving Factors when I start moving                               [facility] Adult PT Treatment/Exercise - 08/21/20 0001       Ambulation/Gait   Ambulation/Gait Yes    Ambulation/Gait Assistance 5: Supervision    Ambulation/Gait Assistance Details  Short distance gait activities between standing exercises in clinic, cues for heelstrike, push-off with gait    Ambulation Distance (Feet) 40 Feet   x 2, 60 ft   Assistive device None    Gait Pattern Step-through pattern;Decreased step length - right;Decreased step length - left;Decreased dorsiflexion - right;Decreased dorsiflexion - left;Right flexed knee in stance;Left flexed knee in stance;Decreased trunk rotation;Narrow base of support      High Level Balance   High Level Balance Comments Forward walking along counter, 4 reps, cues for heelstrike/push-off with gait      Knee/Hip Exercises: Stretches   Passive Hamstring Stretch Right;Left;3 reps;Limitations    Passive Hamstring Stretch Limitations Passive stretch, contract relax in 90/90 position, 3 reps, 10 sec hold each.    Gastroc Stretch Right;20 seconds;2 reps    Gastroc Stretch Limitations Foot propped at 4" step.  Cues for technique    Other Knee/Hip Stretches Passive gastroc stretch, pt in supine, knee extended, 5 reps x 10 seconds.  Active stretch through gastrocs/hip flexors:  stagger stance forward/back rocking at counter,2 sets x 10 reps initial verbal and tactile cues for technique.    Other Knee/Hip Stretches Standing L stepretch at counter, for hamstrings, gastrocs with anteiror/posterior lean      Knee/Hip Exercises: Aerobic   Nustep Level 2, 4 extremities, x 5 minutes, with steps/min >40 for flexibility and strength.  Cues given to fully extend at knees.      Knee/Hip Exercises: Seated   Other Seated Knee/Hip Exercises Seated glut sets, 2 sets x 10 reps    Other Seated Knee/Hip Exercises Forward lean>upright posture x 10 reps    Sit to Sand 5 reps;1 set;with UE support;without UE support   from mat surface, cues for forward lean, trying to achieve terminal knee extension upon standing, squat to prepare to sit.                     PT Short Term Goals - 08/11/20 1551       PT SHORT TERM GOAL #1   Title Pt  will be independent with HEP for decreased pain and improved functional strength, transfers, balance,  and gait.  TARGET 09/05/2020    Time 4    Period Weeks    Status New      PT SHORT TERM GOAL #2  Title Pt will improve 5x sit<>stand to less than or equal to 20 seconds for improved functional lower extremity strength.    Baseline 24.81 sec    Time 4    Period Weeks    Status New      PT SHORT TERM GOAL #3   Title Pt will improve R hamstring ROM by at least 10 degrees for improved flexibility, decreased RLE pain.    Baseline -26 degrees RLE, -22 degrees LLE    Time 4    Period Weeks    Status New      PT SHORT TERM GOAL #4   Title Pt will verbalize/demo at least 3 means to decrease pain in bilat calves and improve stability/decreased pain upon standing.    Baseline 8/10 at worst    Time 4    Period Weeks    Status New               PT Long Term Goals - 08/11/20 1553       PT LONG TERM GOAL #1   Title Pt will be independent with progression of HEP for decreased pain, improved mobility, strength, balance.  TARGET for all LTGS: 10/03/2020    Time 8    Period Weeks    Status New      PT LONG TERM GOAL #2   Title Pt will improve 5x sit<>stand to less than or equal to 15 seconds for improved functional strength.    Time 8    Period Weeks    Status New      PT LONG TERM GOAL #3   Title Pt will improve gait velocity to at least 2.62 ft/sec for improved gait efficiency and safety.    Baseline 2.16 ft/sec    Time 8    Period Weeks    Status New      PT LONG TERM GOAL #4   Title Pt will imrpove TUG score and TUG cog score to less than or equal to 10% difference for improved dual tasking with gait.    Baseline 16.9, 23.56    Time 8    Period Weeks    Status New      PT LONG TERM GOAL #5   Title Pt will ambulate at least 15-20 minutes with appropriate standing rest breaks, without c/o increased pain or fatigue for improved gait indpedence in her neighborhood.     Baseline 10 minutes before having to stop    Time 8    Period Weeks    Status New                   Plan - 08/21/20 1355     Clinical Impression Statement Skilled PT session continued to focus on stretching, strengthening for quads in varied positions.  She continues to have flexed knees with sit>stand and with gait, R>L.  Pt reports more achiness in legs with standing exercises today, but we did work on more quad activation and active gastroc/hamstring stretching.  By end of session, she reports overall pain decrease to 1/10.  She will continue to benefit from skilled PT to further address strength, flexibility, and gait training for improved overall mobility and decreased pain.    Personal Factors and Comorbidities Comorbidity 3+    Comorbidities asthma, GERD, breast cancer, HTN, cevical spondylosis, lumbar radiculopathy, OA of bilat knees, hx of donating kidney to husband    Examination-Activity Limitations Locomotion Level;Transfers;Stand    Examination-Participation Restrictions Church;Community Activity;Meal Prep  PT Frequency 2x / week    PT Duration 8 weeks   including eval week   PT Treatment/Interventions ADLs/Self Care Home Management;Aquatic Therapy;DME Instruction;Neuromuscular re-education;Balance training;Therapeutic exercise;Therapeutic activities;Functional mobility training;Gait training;Patient/family education;Manual techniques;Passive range of motion    PT Next Visit Plan Progress HEP for stretching, strengthening of hips/gluts/quads, gastroc stretching and ankle strength; use of seated stepper; gait training for widened BOS and improved posture, gait mechanics; may consider/discuss aquatics for patient.  Make sure to work on wider BOS upon standing with lateral weigthshifting; stagger stance rocking for active ankle dorsiflexion and quad activation.    Consulted and Agree with Plan of Care Patient             Patient will benefit from skilled therapeutic  intervention in order to improve the following deficits and impairments:  Abnormal gait, Decreased range of motion, Pain, Decreased balance, Impaired flexibility, Decreased mobility, Decreased strength, Postural dysfunction  Visit Diagnosis: Muscle weakness (generalized)  Abnormal posture  Pain in left lower leg  Pain in right lower leg  Other abnormalities of gait and mobility     Problem List Patient Active Problem List   Diagnosis Date Noted   Primary osteoarthritis of right knee 05/12/2020   Primary osteoarthritis of left knee 05/12/2020   Hyperlipidemia    Bilateral calf pain 03/05/2019   Genetic testing 02/13/2019   Family history of ovarian cancer    Family history of bladder cancer    Family history of colon cancer    Family history of leukemia    Lumbar radiculopathy 12/08/2018   Myofascial pain 12/08/2018   Impaired gait and mobility 12/08/2018   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   Chronic nonallergic rhinitis 12/29/2016   Mild persistent asthma, uncomplicated 83/41/9622   Vocal fold paralysis, 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   Gastroesophageal reflux disease 12/29/2016   Cervical spondylosis with myelopathy and radiculopathy 11/14/2013   Essential tremor 06/13/2013   Cervical spondylosis without myelopathy 06/13/2013   Breast cancer of lower-outer quadrant of left female breast ([place]) 09/10/2010    Sol Odor W. 08/21/2020, 5:27 PM Frazier Butt., PT   [facility] [address] [place], [place], [address] Phone: [phone]   Fax:  [phone]  Name: AUSTEN WYGANT MRN: [id number] Date of Birth: 1940/04/01

## 2020-08-22 HISTORY — PX: OTHER SURGICAL HISTORY: SHX169

## 2020-08-26 ENCOUNTER — Ambulatory Visit: Payer: Medicare Other | Attending: Internal Medicine | Admitting: Physical Therapy

## 2020-08-26 ENCOUNTER — Encounter: Payer: Self-pay | Admitting: Physical Therapy

## 2020-08-26 ENCOUNTER — Other Ambulatory Visit: Payer: Self-pay

## 2020-08-26 DIAGNOSIS — R2681 Unsteadiness on feet: Secondary | ICD-10-CM | POA: Insufficient documentation

## 2020-08-26 DIAGNOSIS — M79662 Pain in left lower leg: Secondary | ICD-10-CM | POA: Diagnosis not present

## 2020-08-26 DIAGNOSIS — M6281 Muscle weakness (generalized): Secondary | ICD-10-CM | POA: Insufficient documentation

## 2020-08-26 DIAGNOSIS — R293 Abnormal posture: Secondary | ICD-10-CM | POA: Insufficient documentation

## 2020-08-26 DIAGNOSIS — M79661 Pain in right lower leg: Secondary | ICD-10-CM | POA: Insufficient documentation

## 2020-08-26 DIAGNOSIS — R2689 Other abnormalities of gait and mobility: Secondary | ICD-10-CM | POA: Diagnosis not present

## 2020-08-26 NOTE — Therapy (Signed)
[facility] [address] [place], [place], [address] Phone: [phone]   Fax:  [phone]  Physical Therapy Treatment  Patient Details  Name: April Church MRN: [id number] Date of Birth: [birth year] Referring Provider (PT): Dr. Courtney Heys   Encounter Date: 08/26/2020   PT End of Session - 08/26/20 0809     Visit Number 5    Number of Visits 16    Date for PT Re-Evaluation 10/03/20    Authorization Type Medicare    Progress Note Due on Visit 10    PT Start Time 0805    PT Stop Time 5732    PT Time Calculation (min) 39 min    Activity Tolerance Patient tolerated treatment well;No increased pain   Pt reports pain at end of session as 1/10   Behavior During Therapy [facility] for tasks assessed/performed             Past Medical History:  Diagnosis Date   Asthma    daily inhaler   Breast cancer ([place])    left- radiation and surgery -dx. 2011- no further tx. now- [name] , [name]   Cyst of finger 11/2011   annular cyst right long finger   Dental crowns present    Dermatitis    Family history of bladder cancer    Family history of colon cancer    Family history of leukemia    Family history of ovarian cancer    Frequency of urination    GERD (gastroesophageal reflux disease)    Hemorrhoid    Hyperlipidemia    Hypertension    under control, has been on med. x 2 yrs.   PONV (postoperative nausea and vomiting)    Seasonal allergies    Tremors of nervous system    hands   Trigger finger of right hand 11/2011   long finger    Past Surgical History:  Procedure Laterality Date   ANTERIOR CERVICAL DECOMPRESSION/DISCECTOMY FUSION 4 LEVELS N/A 11/14/2013   Procedure: ANTERIOR CERVICAL DECOMPRESSION/DISCECTOMY FUSION 4 LEVELS;  Surgeon: Newman Pies, MD;  Location: [facility];  Service: Neurosurgery;  Laterality: N/A;  C34 C45 C56 C67 anterior cervical fusion with interbody prosthesis plating and   bonegraft   APPENDECTOMY  age 75   BREAST LUMPECTOMY  06/16/2009   left; SLN bx.   BREAST LUMPECTOMY  07/01/2009   re-excision   BREAST SURGERY  1999   reduction   CATARACT EXTRACTION, BILATERAL     COLONOSCOPY WITH PROPOFOL N/A 12/03/2014   Procedure: COLONOSCOPY WITH PROPOFOL;  Surgeon: [name], MD;  Location: [facility];  Service: Endoscopy;  Laterality: N/A;   FOOT SURGERY  06/22/11   left   KNEE ARTHROSCOPY  03/12/2005   right   KNEE ARTHROSCOPY     left   NASAL SINUS SURGERY     x 2   NEPHRECTOMY LIVING DONOR Left 04/2008   donated to spouse 2010([facility])   TRIGGER FINGER RELEASE  12/16/2011   Procedure: RELEASE TRIGGER FINGER/A-1 PULLEY;  Surgeon: Tennis Must, MD;  Location: [facility];  Service: Orthopedics;  Laterality: Right;  RIGHT LONG FINGER TRIGGER RELEASE & ANNULAR CYST EXCISION   TUMOR EXCISION  age 75   right arm    There were no vitals filed for this visit.   Subjective Assessment - 08/26/20 0807     Subjective Pain was there in her legs some when she woke up this moring, better now after she  has been up moving.    Limitations Walking;Standing    How long can you stand comfortably? < 5 minutes    How long can you walk comfortably? 10 minutes or less    Patient Stated Goals Pt's goals for therapy are to get rid of pain, and improve strength.    Currently in Pain? Yes    Pain Score 1     Pain Location Calf    Pain Orientation Right;Left;Lower    Pain Descriptors / Indicators Aching;Tightness    Pain Type Acute pain    Pain Onset More than a month ago    Pain Frequency Intermittent    Aggravating Factors  worse with standing, stairs    Pain Relieving Factors when she starts moving                   [facility] Adult PT Treatment/Exercise - 08/26/20 0810       Transfers   Transfers Stand to Sit;Sit to Stand    Sit to Stand 5: Supervision;Without upper extremity assist;From chair/3-in-1;From bed    Stand to Sit 5:  Supervision;Without upper extremity assist;To chair/3-in-1      Ambulation/Gait   Ambulation/Gait Yes    Ambulation/Gait Assistance 5: Supervision    Ambulation/Gait Assistance Details gait around track after stretching and ex's performed iwth emphasis on heel>stepping pattern bil LE's, reciprocal arm swing and working on terminal knee extension as pt tends to keep knees flexed through out gait.    Ambulation Distance (Feet) 230 Feet   x1, plus around clinic with session   Assistive device None    Gait Pattern Step-through pattern;Decreased step length - right;Decreased step length - left;Decreased dorsiflexion - right;Decreased dorsiflexion - left;Right flexed knee in stance;Left flexed knee in stance;Decreased trunk rotation;Narrow base of support    Ambulation Surface Level;Indoor      Exercises   Exercises Other Exercises    Other Exercises  seated at edge of mat with feet wide apart for sit<>stands, arms across chest, x 10 reps. emphasis on weight shifting, tall standing with knee extension and controlled descent; at counter in wide staggered stance- working on weight shifting forward with back heel lifting, then weight shifting backward with forward toes lifting for 10 reps each foot forward, single UE support on counter.      Knee/Hip Exercises: Stretches   Passive Hamstring Stretch Right;Left;3 reps;Limitations;30 seconds    Passive Hamstring Stretch Limitations in supine with contralateral LE bent/foot on mat. gentle overpressure at knee to promote increase knee extension with hamstring stretch as well    Other Knee/Hip Stretches pt in supine for passive gastroc stretch bil LE's 30 sec's x 3 reps each with knees extended:      Knee/Hip Exercises: Aerobic   Nustep with UE/LE's level 3 x 8 minutes with focus on full knee extension for strengthening and activity tolerance.                      PT Short Term Goals - 08/11/20 1551       PT SHORT TERM GOAL #1   Title Pt  will be independent with HEP for decreased pain and improved functional strength, transfers, balance,  and gait.  TARGET 09/05/2020    Time 4    Period Weeks    Status New      PT SHORT TERM GOAL #2   Title Pt will improve 5x sit<>stand to less than or equal to 20 seconds for improved functional  lower extremity strength.    Baseline 24.81 sec    Time 4    Period Weeks    Status New      PT SHORT TERM GOAL #3   Title Pt will improve R hamstring ROM by at least 10 degrees for improved flexibility, decreased RLE pain.    Baseline -26 degrees RLE, -22 degrees LLE    Time 4    Period Weeks    Status New      PT SHORT TERM GOAL #4   Title Pt will verbalize/demo at least 3 means to decrease pain in bilat calves and improve stability/decreased pain upon standing.    Baseline 8/10 at worst    Time 4    Period Weeks    Status New               PT Long Term Goals - 08/11/20 1553       PT LONG TERM GOAL #1   Title Pt will be independent with progression of HEP for decreased pain, improved mobility, strength, balance.  TARGET for all LTGS: 10/03/2020    Time 8    Period Weeks    Status New      PT LONG TERM GOAL #2   Title Pt will improve 5x sit<>stand to less than or equal to 15 seconds for improved functional strength.    Time 8    Period Weeks    Status New      PT LONG TERM GOAL #3   Title Pt will improve gait velocity to at least 2.62 ft/sec for improved gait efficiency and safety.    Baseline 2.16 ft/sec    Time 8    Period Weeks    Status New      PT LONG TERM GOAL #4   Title Pt will imrpove TUG score and TUG cog score to less than or equal to 10% difference for improved dual tasking with gait.    Baseline 16.9, 23.56    Time 8    Period Weeks    Status New      PT LONG TERM GOAL #5   Title Pt will ambulate at least 15-20 minutes with appropriate standing rest breaks, without c/o increased pain or fatigue for improved gait indpedence in her neighborhood.     Baseline 10 minutes before having to stop    Time 8    Period Weeks    Status New                   Plan - 08/26/20 0809     Clinical Impression Statement Today's skilled session continued to focus on strengthening, stretching of LE's and gait mechanics for improved balance. Pt reported less tightness at end of session with mild increase in right calf soreness. No other issues noted or reported in session. The pt is making progress toward goals and should benefit from continued PT to progress toward unmet goals.    Personal Factors and Comorbidities Comorbidity 3+    Comorbidities asthma, GERD, breast cancer, HTN, cevical spondylosis, lumbar radiculopathy, OA of bilat knees, hx of donating kidney to husband    Examination-Activity Limitations Locomotion Level;Transfers;Stand    Examination-Participation Restrictions Church;Community Activity;Meal Prep    PT Frequency 2x / week    PT Duration 8 weeks   including eval week   PT Treatment/Interventions ADLs/Self Care Home Management;Aquatic Therapy;DME Instruction;Neuromuscular re-education;Balance training;Therapeutic exercise;Therapeutic activities;Functional mobility training;Gait training;Patient/family education;Manual techniques;Passive range of motion    PT  Next Visit Plan Progress HEP for stretching, strengthening of hips/gluts/quads, gastroc stretching and ankle strength; use of seated stepper; gait training for widened BOS and improved posture, gait mechanics; may consider/discuss aquatics for patient.  Make sure to work on wider BOS upon standing with lateral weigthshifting; stagger stance rocking for active ankle dorsiflexion and quad activation.    Consulted and Agree with Plan of Care Patient             Patient will benefit from skilled therapeutic intervention in order to improve the following deficits and impairments:  Abnormal gait, Decreased range of motion, Pain, Decreased balance, Impaired flexibility, Decreased  mobility, Decreased strength, Postural dysfunction  Visit Diagnosis: Muscle weakness (generalized)  Pain in left lower leg  Pain in right lower leg  Other abnormalities of gait and mobility  Unsteadiness on feet  Abnormal posture     Problem List Patient Active Problem List   Diagnosis Date Noted   Primary osteoarthritis of right knee 05/12/2020   Primary osteoarthritis of left knee 05/12/2020   Hyperlipidemia    Bilateral calf pain 03/05/2019   Genetic testing 02/13/2019   Family history of ovarian cancer    Family history of bladder cancer    Family history of colon cancer    Family history of leukemia    Lumbar radiculopathy 12/08/2018   Myofascial pain 12/08/2018   Impaired gait and mobility 12/08/2018   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   Chronic nonallergic rhinitis 12/29/2016   Mild persistent asthma, uncomplicated 01/60/1093   Vocal fold paralysis, 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   Gastroesophageal reflux disease 12/29/2016   Cervical spondylosis with myelopathy and radiculopathy 11/14/2013   Essential tremor 06/13/2013   Cervical spondylosis without myelopathy 06/13/2013   Breast cancer of lower-outer quadrant of left female breast ([place]) 09/10/2010    Willow Ora, PTA, [facility] Neuro [facility] [address], [place], [place] [address] [phone] 08/26/20, 9:55 AM   Name: FARIDA MCREYNOLDS MRN: [id number] Date of Birth: 06/27/1940

## 2020-08-29 ENCOUNTER — Other Ambulatory Visit: Payer: Self-pay

## 2020-08-29 ENCOUNTER — Ambulatory Visit: Payer: Medicare Other | Admitting: Physical Therapy

## 2020-08-29 DIAGNOSIS — M79662 Pain in left lower leg: Secondary | ICD-10-CM | POA: Diagnosis not present

## 2020-08-29 DIAGNOSIS — M79661 Pain in right lower leg: Secondary | ICD-10-CM

## 2020-08-29 DIAGNOSIS — R293 Abnormal posture: Secondary | ICD-10-CM

## 2020-08-29 DIAGNOSIS — M6281 Muscle weakness (generalized): Secondary | ICD-10-CM

## 2020-08-29 DIAGNOSIS — R2689 Other abnormalities of gait and mobility: Secondary | ICD-10-CM | POA: Diagnosis not present

## 2020-08-29 DIAGNOSIS — R2681 Unsteadiness on feet: Secondary | ICD-10-CM | POA: Diagnosis not present

## 2020-08-29 NOTE — Therapy (Signed)
[facility] [address] [place], [place], [address] Phone: [phone]   Fax:  [phone]  Physical Therapy Treatment  Patient Details  Name: April Church MRN: [id number] Date of Birth: 1940/03/29 Referring Provider (PT): Dr. Courtney Heys   Encounter Date: 08/29/2020   PT End of Session - 08/29/20 1232     Visit Number 6    Number of Visits 16    Date for PT Re-Evaluation 10/03/20    Authorization Type Medicare    Progress Note Due on Visit 10    PT Start Time 1234    PT Stop Time 5956    PT Time Calculation (min) 43 min    Activity Tolerance Patient tolerated treatment well;No increased pain   Pt reports no pain at end of session   Behavior During Therapy [facility] for tasks assessed/performed             Past Medical History:  Diagnosis Date   Asthma    daily inhaler   Breast cancer ([place])    left- radiation and surgery -dx. 2011- no further tx. now- [name] , [name]   Cyst of finger 11/2011   annular cyst right long finger   Dental crowns present    Dermatitis    Family history of bladder cancer    Family history of colon cancer    Family history of leukemia    Family history of ovarian cancer    Frequency of urination    GERD (gastroesophageal reflux disease)    Hemorrhoid    Hyperlipidemia    Hypertension    under control, has been on med. x 2 yrs.   PONV (postoperative nausea and vomiting)    Seasonal allergies    Tremors of nervous system    hands   Trigger finger of right hand 11/2011   long finger    Past Surgical History:  Procedure Laterality Date   ANTERIOR CERVICAL DECOMPRESSION/DISCECTOMY FUSION 4 LEVELS N/A 11/14/2013   Procedure: ANTERIOR CERVICAL DECOMPRESSION/DISCECTOMY FUSION 4 LEVELS;  Surgeon: Newman Pies, MD;  Location: [place] [facility];  Service: Neurosurgery;  Laterality: N/A;  C34 C45 C56 C67 anterior cervical fusion with interbody prosthesis plating and   bonegraft   APPENDECTOMY  age 31   BREAST LUMPECTOMY  06/16/2009   left; SLN bx.   BREAST LUMPECTOMY  07/01/2009   re-excision   BREAST SURGERY  1999   reduction   CATARACT EXTRACTION, BILATERAL     COLONOSCOPY WITH PROPOFOL N/A 12/03/2014   Procedure: COLONOSCOPY WITH PROPOFOL;  Surgeon: [name], MD;  Location: [facility];  Service: Endoscopy;  Laterality: N/A;   FOOT SURGERY  06/22/11   left   KNEE ARTHROSCOPY  03/12/2005   right   KNEE ARTHROSCOPY     left   NASAL SINUS SURGERY     x 2   NEPHRECTOMY LIVING DONOR Left 04/2008   donated to spouse 2010([facility])   TRIGGER FINGER RELEASE  12/16/2011   Procedure: RELEASE TRIGGER FINGER/A-1 PULLEY;  Surgeon: Tennis Must, MD;  Location: [facility];  Service: Orthopedics;  Laterality: Right;  RIGHT LONG FINGER TRIGGER RELEASE & ANNULAR CYST EXCISION   TUMOR EXCISION  age 66   right arm    There were no vitals filed for this visit.   Subjective Assessment - 08/29/20 1235     Subjective R leg was really bothering me after the previous PT session.  I just had to sit around  yesterday.  It's achy and makes it difficult to lift.  It's better today.    Limitations Walking;Standing    How long can you stand comfortably? < 5 minutes    How long can you walk comfortably? 10 minutes or less    Patient Stated Goals Pt's goals for therapy are to get rid of pain, and improve strength.    Currently in Pain? Yes    Pain Score 3     Pain Location Calf    Pain Orientation Right   a little sore in the left   Pain Descriptors / Indicators Aching;Sore;Tightness    Pain Type Acute pain    Pain Onset More than a month ago    Pain Frequency Intermittent    Aggravating Factors  worse since the other day    Pain Relieving Factors moving generally helps, tylenol                               [facility] Adult PT Treatment/Exercise - 08/29/20 1437       Ambulation/Gait   Ambulation/Gait Yes    Ambulation/Gait  Assistance 5: Supervision    Ambulation Distance (Feet) 100 Feet   x 2   Assistive device None    Gait Pattern Step-through pattern;Decreased step length - right;Decreased step length - left;Decreased dorsiflexion - right;Decreased dorsiflexion - left;Right flexed knee in stance;Left flexed knee in stance;Decreased trunk rotation;Narrow base of support    Gait Comments Cues for terminal knee extension upon standing, and with gait to help with push-off phase/heelstrike through lower legs.  She is able to initiate, then reverts to crouched gait pattern      Exercises   Other Exercises  Ended exercises with gentle massage/stretch to [employer], pt continues to report it feels achy.  Performed x 4 minutes, then attempted use of foam roller on gastrocs, but could not get good position.  Discussed pain that can be associated with Parkinson's, due to stiffness/rigidity and to talk to Dr. Leta Baptist at her next neurologist/PD f/u visit.      Knee/Hip Exercises: Stretches   Passive Hamstring Stretch Right;Left;3 reps;Limitations;30 seconds    Passive Hamstring Stretch Limitations Passive stretch, contract relax in 90/90 position, 3 reps, 10 sec hold each.    Gastroc Stretch Right;10 seconds;3 reps    Press photographer Limitations passive with pt in supine      Knee/Hip Exercises: Aerobic   Nustep NuStep, Level 2, 4 extremities, x 6 minutes at beginning of session for flexibility, strengthening of lower extremities; pt keeps steps/min > 40-50      Knee/Hip Exercises: Supine   Short Arc Quad Sets Strengthening;Right;Left;10 reps;2 sets    Short Arc Quad Sets Limitations Cues for full knee extension    Bridges Strengthening;Both;2 sets;10 reps    Straight Leg Raises Strengthening;Right;Left;2 sets;5 reps    Straight Leg Raises Limitations Cues for full knee extension    Other Supine Knee/Hip Exercises Supine with legs resting on red therapy ball:  hip/knee flexion/extension lower extremities pushing ball, x  10; bridging with BLEs over ball, x 10, lower extremity trunk rotation R and L x 5 reps                      PT Short Term Goals - 08/11/20 1551       PT SHORT TERM GOAL #1   Title Pt will be independent with HEP for decreased pain and  improved functional strength, transfers, balance,  and gait.  TARGET 09/05/2020    Time 4    Period Weeks    Status New      PT SHORT TERM GOAL #2   Title Pt will improve 5x sit<>stand to less than or equal to 20 seconds for improved functional lower extremity strength.    Baseline 24.81 sec    Time 4    Period Weeks    Status New      PT SHORT TERM GOAL #3   Title Pt will improve R hamstring ROM by at least 10 degrees for improved flexibility, decreased RLE pain.    Baseline -26 degrees RLE, -22 degrees LLE    Time 4    Period Weeks    Status New      PT SHORT TERM GOAL #4   Title Pt will verbalize/demo at least 3 means to decrease pain in bilat calves and improve stability/decreased pain upon standing.    Baseline 8/10 at worst    Time 4    Period Weeks    Status New               PT Long Term Goals - 08/11/20 1553       PT LONG TERM GOAL #1   Title Pt will be independent with progression of HEP for decreased pain, improved mobility, strength, balance.  TARGET for all LTGS: 10/03/2020    Time 8    Period Weeks    Status New      PT LONG TERM GOAL #2   Title Pt will improve 5x sit<>stand to less than or equal to 15 seconds for improved functional strength.    Time 8    Period Weeks    Status New      PT LONG TERM GOAL #3   Title Pt will improve gait velocity to at least 2.62 ft/sec for improved gait efficiency and safety.    Baseline 2.16 ft/sec    Time 8    Period Weeks    Status New      PT LONG TERM GOAL #4   Title Pt will imrpove TUG score and TUG cog score to less than or equal to 10% difference for improved dual tasking with gait.    Baseline 16.9, 23.56    Time 8    Period Weeks    Status New       PT LONG TERM GOAL #5   Title Pt will ambulate at least 15-20 minutes with appropriate standing rest breaks, without c/o increased pain or fatigue for improved gait indpedence in her neighborhood.    Baseline 10 minutes before having to stop    Time 8    Period Weeks    Status New                   Plan - 08/29/20 1444     Clinical Impression Statement Pt's pain continues to fluctuate, with pt reporting increase in achiness/soreness of R calf after last session.  Educated pt in types of muscle pain, and that the pain she is describing may be overuse type pain from exercising the muscle (and that type of pain is okay).  She continues to report decreased pain at end of session.  Measured knee range of motion today in supine 90/90 position, passively, with improvements noted in R hamstring from -26 to -22 degrees from full knee extension.  L hamstrings improved from -22 to -15 degrees from  full knee extension.  Pt is improving with flexibility measurements, but will continue to benefit from skilled PT to further work on terminal knee extension as well as gastroc strengthning/flexibility for push off and heelstrike iwth gait pattern.  Discussed aquatics and pt may be interested.    Personal Factors and Comorbidities Comorbidity 3+    Comorbidities asthma, GERD, breast cancer, HTN, cevical spondylosis, lumbar radiculopathy, OA of bilat knees, hx of donating kidney to husband    Examination-Activity Limitations Locomotion Level;Transfers;Stand    Examination-Participation Restrictions Church;Community Activity;Meal Prep    PT Frequency 2x / week    PT Duration 8 weeks   including eval week   PT Treatment/Interventions ADLs/Self Care Home Management;Aquatic Therapy;DME Instruction;Neuromuscular re-education;Balance training;Therapeutic exercise;Therapeutic activities;Functional mobility training;Gait training;Patient/family education;Manual techniques;Passive range of motion    PT Next Visit Plan  Progress HEP for stretching, strengthening of hips/gluts/quads, gastroc stretching and ankle strength; use of seated stepper; gait training for widened BOS and improved posture, gait mechanics; Follow up/discuss aquatics for patient with Denise/ [employer]innie Level.  Make sure to work on wider BOS upon standing with lateral weigthshifting; stagger stance rocking for active ankle dorsiflexion and quad activation.  Try terminal knee extension exercises; need to check STGs next week.    Consulted and Agree with Plan of Care Patient             Patient will benefit from skilled therapeutic intervention in order to improve the following deficits and impairments:  Abnormal gait, Decreased range of motion, Pain, Decreased balance, Impaired flexibility, Decreased mobility, Decreased strength, Postural dysfunction  Visit Diagnosis: Muscle weakness (generalized)  Abnormal posture  Pain in right lower leg  Other abnormalities of gait and mobility     Problem List Patient Active Problem List   Diagnosis Date Noted   Primary osteoarthritis of right knee 05/12/2020   Primary osteoarthritis of left knee 05/12/2020   Hyperlipidemia    Bilateral calf pain 03/05/2019   Genetic testing 02/13/2019   Family history of ovarian cancer    Family history of bladder cancer    Family history of colon cancer    Family history of leukemia    Lumbar radiculopathy 12/08/2018   Myofascial pain 12/08/2018   Impaired gait and mobility 12/08/2018   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   Chronic nonallergic rhinitis 12/29/2016   Mild persistent asthma, uncomplicated 25/85/2778   Vocal fold paralysis, 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   Gastroesophageal reflux disease 12/29/2016   Cervical spondylosis with myelopathy and radiculopathy 11/14/2013   Essential tremor 06/13/2013   Cervical spondylosis without myelopathy 06/13/2013   Breast cancer of lower-outer quadrant of left female breast ([place]) 09/10/2010    Esteven Overfelt  W. 08/29/2020, 2:50 PM Frazier Butt., PT  [facility] [address] [place], [place], [address] Phone: [phone]   Fax:  [phone]  Name: April Church MRN: [id number] Date of Birth: Jun 18, 1940

## 2020-09-01 ENCOUNTER — Encounter: Payer: Self-pay | Admitting: Physical Therapy

## 2020-09-01 ENCOUNTER — Ambulatory Visit: Payer: Medicare Other | Admitting: Physical Therapy

## 2020-09-01 ENCOUNTER — Other Ambulatory Visit: Payer: Self-pay

## 2020-09-01 DIAGNOSIS — M6281 Muscle weakness (generalized): Secondary | ICD-10-CM

## 2020-09-01 DIAGNOSIS — R2689 Other abnormalities of gait and mobility: Secondary | ICD-10-CM | POA: Diagnosis not present

## 2020-09-01 DIAGNOSIS — R2681 Unsteadiness on feet: Secondary | ICD-10-CM | POA: Diagnosis not present

## 2020-09-01 DIAGNOSIS — M79661 Pain in right lower leg: Secondary | ICD-10-CM | POA: Diagnosis not present

## 2020-09-01 DIAGNOSIS — R293 Abnormal posture: Secondary | ICD-10-CM

## 2020-09-01 DIAGNOSIS — M79662 Pain in left lower leg: Secondary | ICD-10-CM | POA: Diagnosis not present

## 2020-09-01 NOTE — Therapy (Signed)
[facility] [address] [place], [place], [address] Phone: [phone]   Fax:  [phone]  Physical Therapy Treatment  Patient Details  Name: April Church MRN: [id number] Date of Birth: [birth year] Referring Provider (PT): Dr. Courtney Heys   Encounter Date: 09/01/2020   PT End of Session - 09/01/20 1318     Visit Number 7    Number of Visits 16    Date for PT Re-Evaluation 10/03/20    Authorization Type Medicare    Progress Note Due on Visit 10    PT Start Time 1319    PT Stop Time 1400    PT Time Calculation (min) 41 min    Activity Tolerance Patient tolerated treatment well;No increased pain   Pt reports no pain at end of session   Behavior During Therapy [facility] for tasks assessed/performed             Past Medical History:  Diagnosis Date   Asthma    daily inhaler   Breast cancer ([place])    left- radiation and surgery -dx. 2011- no further tx. now- [name] , [name]   Cyst of finger 11/2011   annular cyst right long finger   Dental crowns present    Dermatitis    Family history of bladder cancer    Family history of colon cancer    Family history of leukemia    Family history of ovarian cancer    Frequency of urination    GERD (gastroesophageal reflux disease)    Hemorrhoid    Hyperlipidemia    Hypertension    under control, has been on med. x 2 yrs.   PONV (postoperative nausea and vomiting)    Seasonal allergies    Tremors of nervous system    hands   Trigger finger of right hand 11/2011   long finger    Past Surgical History:  Procedure Laterality Date   ANTERIOR CERVICAL DECOMPRESSION/DISCECTOMY FUSION 4 LEVELS N/A 11/14/2013   Procedure: ANTERIOR CERVICAL DECOMPRESSION/DISCECTOMY FUSION 4 LEVELS;  Surgeon: Newman Pies, MD;  Location: [facility];  Service: Neurosurgery;  Laterality: N/A;  C34 C45 C56 C67 anterior cervical fusion with interbody prosthesis plating and   bonegraft   APPENDECTOMY  age 80   BREAST LUMPECTOMY  06/16/2009   left; SLN bx.   BREAST LUMPECTOMY  07/01/2009   re-excision   BREAST SURGERY  1999   reduction   CATARACT EXTRACTION, BILATERAL     COLONOSCOPY WITH PROPOFOL N/A 12/03/2014   Procedure: COLONOSCOPY WITH PROPOFOL;  Surgeon: [name], MD;  Location: [facility];  Service: Endoscopy;  Laterality: N/A;   FOOT SURGERY  06/22/11   left   KNEE ARTHROSCOPY  03/12/2005   right   KNEE ARTHROSCOPY     left   NASAL SINUS SURGERY     x 2   NEPHRECTOMY LIVING DONOR Left 04/2008   donated to spouse 2010([facility])   TRIGGER FINGER RELEASE  12/16/2011   Procedure: RELEASE TRIGGER FINGER/A-1 PULLEY;  Surgeon: Tennis Must, MD;  Location: [facility];  Service: Orthopedics;  Laterality: Right;  RIGHT LONG FINGER TRIGGER RELEASE & ANNULAR CYST EXCISION   TUMOR EXCISION  age 80   right arm    There were no vitals filed for this visit.   Subjective Assessment - 09/01/20 1318     Subjective Feel better today than last time I was here.  Think the muscles were just overworked.  Limitations Walking;Standing    How long can you stand comfortably? < 5 minutes    How long can you walk comfortably? 10 minutes or less    Patient Stated Goals Pt's goals for therapy are to get rid of pain, and improve strength.    Currently in Pain? Yes    Pain Score 2     Pain Location Calf    Pain Orientation Right    Pain Descriptors / Indicators Aching;Sore    Pain Type Chronic pain    Pain Onset More than a month ago    Pain Frequency Intermittent    Aggravating Factors  too much exercise, sitting too long upon first standing    Pain Relieving Factors moving generally helps                               [facility] Adult PT Treatment/Exercise - 09/01/20 0001       Ambulation/Gait   Ambulation/Gait Yes    Ambulation/Gait Assistance 5: Supervision    Ambulation Distance (Feet) 20 Feet   x 4; adiditonal 300;  50 ft, 80 ft   Assistive device None    Gait Pattern Step-through pattern;Decreased step length - right;Decreased step length - left;Decreased dorsiflexion - right;Decreased dorsiflexion - left;Right flexed knee in stance;Left flexed knee in stance;Decreased trunk rotation;Narrow base of support    Ambulation Surface Level;Indoor    Gait Comments Forward/back walking along counter, cues for heel/toe walking forward, heelstrike, then toe>heel and step length in backward direction      Knee/Hip Exercises: Stretches   Press photographer Right;Left;3 reps;10 seconds    Gastroc Stretch Limitations foot propped on 4" shelf, cues for technique    Other Knee/Hip Stretches Standing L stretch at counter, for hamstrings, gastrocs with anteiror/posterior lean, x 5 reps      Knee/Hip Exercises: Standing   Forward Step Up Right;Left;2 sets;5 reps    Forward Step Up Limitations Step up to 6" step, then other leg tap to 12" step>back to floor, cues for terminal knee extension on stance leg    Functional Squat 5 reps;2 sets    Functional Squat Limitations STanding at counter, to sit back at elevated stool    Other Standing Knee Exercises Stagger stance forward/back rocking, 10-12 reps each, cues for technique for upright position through forward/stance hip and "hinge" at hip posture when rocking back      Knee/Hip Exercises: Seated   Sit to Sand 10 reps;without UE support   Verbal/tactile cues for forward lean, and for upright stand; tactile cues at quads for terminal knee extension in standing.     Ankle Exercises: Standing   Heel Raises Both;10 reps;3 seconds    Toe Raise 10 reps;3 seconds   BLES at counter   Other Standing Ankle Exercises Alt heel raises x 10 reps at counter.                      PT Short Term Goals - 08/11/20 1551       PT SHORT TERM GOAL #1   Title Pt will be independent with HEP for decreased pain and improved functional strength, transfers, balance,  and gait.  TARGET  09/05/2020    Time 4    Period Weeks    Status New      PT SHORT TERM GOAL #2   Title Pt will improve 5x sit<>stand to less than or equal to 20  seconds for improved functional lower extremity strength.    Baseline 24.81 sec    Time 4    Period Weeks    Status New      PT SHORT TERM GOAL #3   Title Pt will improve R hamstring ROM by at least 10 degrees for improved flexibility, decreased RLE pain.    Baseline -26 degrees RLE, -22 degrees LLE    Time 4    Period Weeks    Status New      PT SHORT TERM GOAL #4   Title Pt will verbalize/demo at least 3 means to decrease pain in bilat calves and improve stability/decreased pain upon standing.    Baseline 8/10 at worst    Time 4    Period Weeks    Status New               PT Long Term Goals - 08/11/20 1553       PT LONG TERM GOAL #1   Title Pt will be independent with progression of HEP for decreased pain, improved mobility, strength, balance.  TARGET for all LTGS: 10/03/2020    Time 8    Period Weeks    Status New      PT LONG TERM GOAL #2   Title Pt will improve 5x sit<>stand to less than or equal to 15 seconds for improved functional strength.    Time 8    Period Weeks    Status New      PT LONG TERM GOAL #3   Title Pt will improve gait velocity to at least 2.62 ft/sec for improved gait efficiency and safety.    Baseline 2.16 ft/sec    Time 8    Period Weeks    Status New      PT LONG TERM GOAL #4   Title Pt will imrpove TUG score and TUG cog score to less than or equal to 10% difference for improved dual tasking with gait.    Baseline 16.9, 23.56    Time 8    Period Weeks    Status New      PT LONG TERM GOAL #5   Title Pt will ambulate at least 15-20 minutes with appropriate standing rest breaks, without c/o increased pain or fatigue for improved gait indpedence in her neighborhood.    Baseline 10 minutes before having to stop    Time 8    Period Weeks    Status New                   Plan -  09/01/20 1514     Clinical Impression Statement PT spends significant time, verbal, tactile cues with varied activities on education for activation of gluts and quads in upright standing.  Pt responds best with standing at counter and PT gives pt cues to bring weight back onto heels then activate through gluts and quads.  She still remains limited in knee extension, on R more than L. Through varied gait distances in session today, pt demo improved step length/heelstrike throughout.  She is progressing towards goals and will continue to benefit from skilled PT to fruther address strength, balance, posture, gait for improved mobility.    Personal Factors and Comorbidities Comorbidity 3+    Comorbidities asthma, GERD, breast cancer, HTN, cevical spondylosis, lumbar radiculopathy, OA of bilat knees, hx of donating kidney to husband    Examination-Activity Limitations Locomotion Level;Transfers;Stand    Examination-Participation Restrictions Church;Community Activity;Meal Prep    PT  Frequency 2x / week    PT Duration 8 weeks   including eval week   PT Treatment/Interventions ADLs/Self Care Home Management;Aquatic Therapy;DME Instruction;Neuromuscular re-education;Balance training;Therapeutic exercise;Therapeutic activities;Functional mobility training;Gait training;Patient/family education;Manual techniques;Passive range of motion    PT Next Visit Plan Check STGs.  (Check to see if pt got more appts through 8/12).  Progress HEP for stretching, strengthening of hips/gluts/quads, gastroc stretching and ankle strength (to more standing and active, for additions to HEP when appopriate); use of seated stepper; gait training for widened BOS and improved posture, gait mechanics; Follow up/discuss aquatics for patient with Denise/ [employer].  Make sure to work on wider BOS upon standing with lateral weigthshifting; stagger stance rocking for active ankle dorsiflexion and quad activation.  Try terminal knee extension  exercises.    Consulted and Agree with Plan of Care Patient             Patient will benefit from skilled therapeutic intervention in order to improve the following deficits and impairments:  Abnormal gait, Decreased range of motion, Pain, Decreased balance, Impaired flexibility, Decreased mobility, Decreased strength, Postural dysfunction  Visit Diagnosis: Abnormal posture  Muscle weakness (generalized)  Other abnormalities of gait and mobility     Problem List Patient Active Problem List   Diagnosis Date Noted   Primary osteoarthritis of right knee 05/12/2020   Primary osteoarthritis of left knee 05/12/2020   Hyperlipidemia    Bilateral calf pain 03/05/2019   Genetic testing 02/13/2019   Family history of ovarian cancer    Family history of bladder cancer    Family history of colon cancer    Family history of leukemia    Lumbar radiculopathy 12/08/2018   Myofascial pain 12/08/2018   Impaired gait and mobility 12/08/2018   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   Chronic nonallergic rhinitis 12/29/2016   Mild persistent asthma, uncomplicated 35/46/5681   Vocal fold paralysis, 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   Gastroesophageal reflux disease 12/29/2016   Cervical spondylosis with myelopathy and radiculopathy 11/14/2013   Essential tremor 06/13/2013   Cervical spondylosis without myelopathy 06/13/2013   Breast cancer of lower-outer quadrant of left female breast ([place]) 09/10/2010    Ethelda Deangelo W. 09/01/2020, 3:19 PM Frazier Butt., PT   [facility] [address] [place], [place], [address] Phone: [phone]   Fax:  [phone]  Name: JAIMYA FELICIANO MRN: [id number] Date of Birth: 05-01-1940

## 2020-09-04 ENCOUNTER — Other Ambulatory Visit: Payer: Self-pay

## 2020-09-04 ENCOUNTER — Ambulatory Visit: Payer: Medicare Other | Admitting: Physical Therapy

## 2020-09-04 ENCOUNTER — Encounter: Payer: Self-pay | Admitting: Physical Therapy

## 2020-09-04 DIAGNOSIS — M6281 Muscle weakness (generalized): Secondary | ICD-10-CM | POA: Diagnosis not present

## 2020-09-04 DIAGNOSIS — R2689 Other abnormalities of gait and mobility: Secondary | ICD-10-CM

## 2020-09-04 DIAGNOSIS — R293 Abnormal posture: Secondary | ICD-10-CM | POA: Diagnosis not present

## 2020-09-04 DIAGNOSIS — M79661 Pain in right lower leg: Secondary | ICD-10-CM | POA: Diagnosis not present

## 2020-09-04 DIAGNOSIS — M79662 Pain in left lower leg: Secondary | ICD-10-CM | POA: Diagnosis not present

## 2020-09-04 DIAGNOSIS — R2681 Unsteadiness on feet: Secondary | ICD-10-CM | POA: Diagnosis not present

## 2020-09-05 NOTE — Therapy (Addendum)
[facility] [address] [place], [place], [address] Phone: [phone]   Fax:  [phone]  Physical Therapy Treatment  Patient Details  Name: April Church MRN: [id number] Date of Birth: Nov 16, 1940 Referring Provider (PT): Dr. Courtney Heys   Encounter Date: 09/04/2020   PT End of Session - 09/04/20 1408     Visit Number 8    Number of Visits 16    Date for PT Re-Evaluation 10/03/20    Authorization Type Medicare    Progress Note Due on Visit 10    PT Start Time 1017    PT Stop Time 5102    PT Time Calculation (min) 41 min    Activity Tolerance Patient tolerated treatment well;No increased pain   Pt reports no pain at end of session   Behavior During Therapy [facility] for tasks assessed/performed             Past Medical History:  Diagnosis Date   Asthma    daily inhaler   Breast cancer ([place])    left- radiation and surgery -dx. 2011- no further tx. now- [name] , [name]   Cyst of finger 11/2011   annular cyst right long finger   Dental crowns present    Dermatitis    Family history of bladder cancer    Family history of colon cancer    Family history of leukemia    Family history of ovarian cancer    Frequency of urination    GERD (gastroesophageal reflux disease)    Hemorrhoid    Hyperlipidemia    Hypertension    under control, has been on med. x 2 yrs.   PONV (postoperative nausea and vomiting)    Seasonal allergies    Tremors of nervous system    hands   Trigger finger of right hand 11/2011   long finger    Past Surgical History:  Procedure Laterality Date   ANTERIOR CERVICAL DECOMPRESSION/DISCECTOMY FUSION 4 LEVELS N/A 11/14/2013   Procedure: ANTERIOR CERVICAL DECOMPRESSION/DISCECTOMY FUSION 4 LEVELS;  Surgeon: Newman Pies, MD;  Location: [facility];  Service: Neurosurgery;  Laterality: N/A;  C34 C45 C56 C67 anterior cervical fusion with interbody prosthesis plating and   bonegraft   APPENDECTOMY  age 56   BREAST LUMPECTOMY  06/16/2009   left; SLN bx.   BREAST LUMPECTOMY  07/01/2009   re-excision   BREAST SURGERY  1999   reduction   CATARACT EXTRACTION, BILATERAL     COLONOSCOPY WITH PROPOFOL N/A 12/03/2014   Procedure: COLONOSCOPY WITH PROPOFOL;  Surgeon: [name], MD;  Location: [facility];  Service: Endoscopy;  Laterality: N/A;   FOOT SURGERY  06/22/11   left   KNEE ARTHROSCOPY  03/12/2005   right   KNEE ARTHROSCOPY     left   NASAL SINUS SURGERY     x 2   NEPHRECTOMY LIVING DONOR Left 04/2008   donated to spouse 2010([facility])   TRIGGER FINGER RELEASE  12/16/2011   Procedure: RELEASE TRIGGER FINGER/A-1 PULLEY;  Surgeon: Tennis Must, MD;  Location: [facility];  Service: Orthopedics;  Laterality: Right;  RIGHT LONG FINGER TRIGGER RELEASE & ANNULAR CYST EXCISION   TUMOR EXCISION  age 50   right arm    There were no vitals filed for this visit.   Subjective Assessment - 09/04/20 1407     Subjective No new complatins. No falls.    Limitations Walking;Standing    How long can you stand  comfortably? < 5 minutes    How long can you walk comfortably? 10 minutes or less    Patient Stated Goals Pt's goals for therapy are to get rid of pain, and improve strength.    Currently in Pain? Yes    Pain Score 2     Pain Location Calf    Pain Orientation Right;Left   right>left   Pain Descriptors / Indicators Aching;Sore    Pain Type Chronic pain    Pain Onset More than a month ago    Pain Frequency Intermittent    Aggravating Factors  increased activity,upon first standing after sitting too long    Pain Relieving Factors moving generally helps                [facility] PT Assessment - 09/04/20 1409       PROM   Overall PROM Comments Hamstring lenght lacking on left -14 degrees, right - 20 degrees from full extension                   [facility] Adult PT Treatment/Exercise - 09/04/20 1409       Transfers   Transfers  Stand to Sit;Sit to Stand    Sit to Stand 5: Supervision;Without upper extremity assist;From chair/3-in-1;From bed    Five time sit to stand comments  16.62 no UE support from standard height surfaces    Stand to Sit 5: Supervision;Without upper extremity assist;To chair/3-in-1      Ambulation/Gait   Ambulation/Gait Yes    Ambulation/Gait Assistance 5: Supervision    Ambulation/Gait Assistance Details around clinic with session    Assistive device None    Gait Pattern Step-through pattern;Decreased step length - right;Decreased step length - left;Decreased dorsiflexion - right;Decreased dorsiflexion - left;Right flexed knee in stance;Left flexed knee in stance;Decreased trunk rotation;Narrow base of support      Self-Care   Self-Care Other Self-Care Comments    Other Self-Care Comments  Pt able to state stretching, walking and with leading cues exercise as ways to manage calf pain. reinforced use of heat/ice and elevations as ways to manage pain as well.      Exercises   Exercises Other Exercises    Other Exercises  at counter: heel<>toe raise for 10 reps with UE support, cues/facilitation to stay tall/decrease hip flexion; at bottom step with bil UE support on rails- heel cord stretching by dropping heel off edge of step for 30 sec's x 3 reps with cues/facilitation to stay tall/not lean trunk back; supine on mat table- with pillow under bil feet- pt pushing feet internally into PTA hands for for isometric hip/IT band strengthening for 5 sec's x 10 reps; then pt performing active quad sets simulatneous with glut sets for 5 sec holds x 10 reps to promote knee and hip extension stretching.      Knee/Hip Exercises: Aerobic   Nustep NuStep, Level 2, 4 extremities, x 8 minutes at beginning of session for flexibility, strengthening of lower extremities; pt keeps steps/min > 40-50               PT Short Term Goals - 09/04/20 1630       PT SHORT TERM GOAL #1   Title Pt will be independent  with HEP for decreased pain and improved functional strength, transfers, balance,  and gait.  TARGET 09/05/2020    Baseline 09/04/20: met with current program    Status Achieved      PT SHORT TERM GOAL #2  Title Pt will improve 5x sit<>stand to less than or equal to 20 seconds for improved functional lower extremity strength.    Baseline 09/04/20: 16.62 sec's no UE support    Status Achieved      PT SHORT TERM GOAL #3   Title Pt will improve R hamstring ROM by at least 10 degrees for improved flexibility, decreased RLE pain.    Baseline 09/04/20: right LE - 20, left LE -14 ( improved from -26 on right , -22 on left) just not to goal    Status Partially Met      PT SHORT TERM GOAL #4   Title Pt will verbalize/demo at least 3 means to decrease pain in bilat calves and improve stability/decreased pain upon standing.    Baseline 09/04/20: met in session wtih cues    Status Partially Met                         PT Long Term Goals - 08/11/20 1553       PT LONG TERM GOAL #1   Title Pt will be independent with progression of HEP for decreased pain, improved mobility, strength, balance.  TARGET for all LTGS: 10/03/2020    Time 8    Period Weeks    Status New      PT LONG TERM GOAL #2   Title Pt will improve 5x sit<>stand to less than or equal to 15 seconds for improved functional strength.    Time 8    Period Weeks    Status New      PT LONG TERM GOAL #3   Title Pt will improve gait velocity to at least 2.62 ft/sec for improved gait efficiency and safety.    Baseline 2.16 ft/sec    Time 8    Period Weeks    Status New      PT LONG TERM GOAL #4   Title Pt will imrpove TUG score and TUG cog score to less than or equal to 10% difference for improved dual tasking with gait.    Baseline 16.9, 23.56    Time 8    Period Weeks    Status New      PT LONG TERM GOAL #5   Title Pt will ambulate at least 15-20 minutes with appropriate standing rest breaks, without c/o increased  pain or fatigue for improved gait indpedence in her neighborhood.    Baseline 10 minutes before having to stop    Time 8    Period Weeks    Status New              09/04/20 1409  Plan  Clinical Impression Statement Today's skilled session focused on progress toward STGs with goals partially to fully met. Remainder of session continued to address stretching and strengthening with no pain reported at end of session. The pt is progressing toward goals and should benefit from continued PT to progress toward unmet goals.  Personal Factors and Comorbidities Comorbidity 3+  Comorbidities asthma, GERD, breast cancer, HTN, cevical spondylosis, lumbar radiculopathy, OA of bilat knees, hx of donating kidney to husband  Examination-Activity Limitations Locomotion Level;Transfers;Stand  Examination-Participation Restrictions Church;Community Activity;Meal Prep  Pt will benefit from skilled therapeutic intervention in order to improve on the following deficits Abnormal gait;Decreased range of motion;Pain;Decreased balance;Impaired flexibility;Decreased mobility;Decreased strength;Postural dysfunction  PT Frequency 2x / week  PT Duration 8 weeks (including eval week)  PT Treatment/Interventions ADLs/Self Care Home Management;Aquatic Therapy;DME Instruction;Neuromuscular re-education;Balance  training;Therapeutic exercise;Therapeutic activities;Functional mobility training;Gait training;Patient/family education;Manual techniques;Passive range of motion  PT Next Visit Plan Progress HEP for stretching, strengthening of hips/gluts/quads, gastroc stretching and ankle strength (to more standing and active, for additions to HEP when appopriate); use of seated stepper; gait training for widened BOS and improved posture, gait mechanics; Follow up/discuss aquatics for patient with Denise/ Vinnie Level.  Make sure to work on wider BOS upon standing with lateral weigthshifting; stagger stance rocking for active ankle  dorsiflexion and quad activation.  Try terminal knee extension exercises.  PT Home Exercise Plan Access Code: [email or web address]  Consulted and Agree with Plan of Care Patient           Patient will benefit from skilled therapeutic intervention in order to improve the following deficits and impairments:  Abnormal gait, Decreased range of motion, Pain, Decreased balance, Impaired flexibility, Decreased mobility, Decreased strength, Postural dysfunction  Visit Diagnosis: Abnormal posture  Muscle weakness (generalized)  Other abnormalities of gait and mobility     Problem List Patient Active Problem List   Diagnosis Date Noted   Primary osteoarthritis of right knee 05/12/2020   Primary osteoarthritis of left knee 05/12/2020   Hyperlipidemia    Bilateral calf pain 03/05/2019   Genetic testing 02/13/2019   Family history of ovarian cancer    Family history of bladder cancer    Family history of colon cancer    Family history of leukemia    Lumbar radiculopathy 12/08/2018   Myofascial pain 12/08/2018   Impaired gait and mobility 12/08/2018   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   Chronic nonallergic rhinitis 12/29/2016   Mild persistent asthma, uncomplicated 74/16/3845   Vocal fold paralysis, 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   Gastroesophageal reflux disease 12/29/2016   Cervical spondylosis with myelopathy and radiculopathy 11/14/2013   Essential tremor 06/13/2013   Cervical spondylosis without myelopathy 06/13/2013   Breast cancer of lower-outer quadrant of left female breast ([place]) 09/10/2010    Willow Ora, PTA, [facility] [address], [place], [place] [address] [phone] 09/05/20, 7:26 PM   Name: NAI BORROMEO MRN: [id number] Date of Birth: 11/04/1940

## 2020-09-08 ENCOUNTER — Other Ambulatory Visit: Payer: Self-pay

## 2020-09-08 ENCOUNTER — Ambulatory Visit: Payer: Medicare Other | Admitting: Physical Therapy

## 2020-09-08 DIAGNOSIS — R2689 Other abnormalities of gait and mobility: Secondary | ICD-10-CM | POA: Diagnosis not present

## 2020-09-08 DIAGNOSIS — R293 Abnormal posture: Secondary | ICD-10-CM

## 2020-09-08 DIAGNOSIS — M6281 Muscle weakness (generalized): Secondary | ICD-10-CM

## 2020-09-08 DIAGNOSIS — R2681 Unsteadiness on feet: Secondary | ICD-10-CM | POA: Diagnosis not present

## 2020-09-08 DIAGNOSIS — M79662 Pain in left lower leg: Secondary | ICD-10-CM | POA: Diagnosis not present

## 2020-09-08 DIAGNOSIS — M79661 Pain in right lower leg: Secondary | ICD-10-CM | POA: Diagnosis not present

## 2020-09-08 NOTE — Therapy (Signed)
[facility] [address] [place], [place], [address] Phone: [phone]   Fax:  [phone]  Physical Therapy Treatment  Patient Details  Name: April Church MRN: [id number] Date of Birth: 1940/05/16 Referring Provider (PT): Dr. Courtney Heys   Encounter Date: 09/08/2020   PT End of Session - 09/08/20 1319     Visit Number 9    Number of Visits 16    Date for PT Re-Evaluation 10/03/20    Authorization Type Medicare    Progress Note Due on Visit 10    PT Start Time 1319    PT Stop Time 1402    PT Time Calculation (min) 43 min    Activity Tolerance Patient tolerated treatment well;No increased pain   Pt reports no pain at end of session   Behavior During Therapy [facility] for tasks assessed/performed             Past Medical History:  Diagnosis Date   Asthma    daily inhaler   Breast cancer ([place])    left- radiation and surgery -dx. 2011- no further tx. now- [name] , [name]   Cyst of finger 11/2011   annular cyst right long finger   Dental crowns present    Dermatitis    Family history of bladder cancer    Family history of colon cancer    Family history of leukemia    Family history of ovarian cancer    Frequency of urination    GERD (gastroesophageal reflux disease)    Hemorrhoid    Hyperlipidemia    Hypertension    under control, has been on med. x 2 yrs.   PONV (postoperative nausea and vomiting)    Seasonal allergies    Tremors of nervous system    hands   Trigger finger of right hand 11/2011   long finger    Past Surgical History:  Procedure Laterality Date   ANTERIOR CERVICAL DECOMPRESSION/DISCECTOMY FUSION 4 LEVELS N/A 11/14/2013   Procedure: ANTERIOR CERVICAL DECOMPRESSION/DISCECTOMY FUSION 4 LEVELS;  Surgeon: Newman Pies, MD;  Location: [facility];  Service: Neurosurgery;  Laterality: N/A;  C34 C45 C56 C67 anterior cervical fusion with interbody prosthesis plating and   bonegraft   APPENDECTOMY  age 33   BREAST LUMPECTOMY  06/16/2009   left; SLN bx.   BREAST LUMPECTOMY  07/01/2009   re-excision   BREAST SURGERY  1999   reduction   CATARACT EXTRACTION, BILATERAL     COLONOSCOPY WITH PROPOFOL N/A 12/03/2014   Procedure: COLONOSCOPY WITH PROPOFOL;  Surgeon: [name], MD;  Location: [facility];  Service: Endoscopy;  Laterality: N/A;   FOOT SURGERY  06/22/11   left   KNEE ARTHROSCOPY  03/12/2005   right   KNEE ARTHROSCOPY     left   NASAL SINUS SURGERY     x 2   NEPHRECTOMY LIVING DONOR Left 04/2008   donated to spouse 2010([facility])   TRIGGER FINGER RELEASE  12/16/2011   Procedure: RELEASE TRIGGER FINGER/A-1 PULLEY;  Surgeon: Tennis Must, MD;  Location: [facility];  Service: Orthopedics;  Laterality: Right;  RIGHT LONG FINGER TRIGGER RELEASE & ANNULAR CYST EXCISION   TUMOR EXCISION  age 54   right arm    There were no vitals filed for this visit.   Subjective Assessment - 09/08/20 1319     Subjective Still achy in bilateral calves, especially when I first get up.  Overall, it is still better.  Limitations Walking;Standing    How long can you stand comfortably? < 5 minutes    How long can you walk comfortably? 10 minutes or less    Patient Stated Goals Pt's goals for therapy are to get rid of pain, and improve strength.    Currently in Pain? Yes    Pain Score 2     Pain Location Calf    Pain Orientation Right;Left    Pain Descriptors / Indicators Aching;Sore    Pain Type Chronic pain    Pain Onset More than a month ago    Pain Frequency Intermittent    Aggravating Factors  increased activity, upon first standing after sitting too long    Pain Relieving Factors moving generally helps                               [facility] Adult PT Treatment/Exercise - 09/08/20 0001       Ambulation/Gait   Ambulation/Gait Yes    Ambulation/Gait Assistance 5: Supervision    Ambulation/Gait Assistance Details 100    x 2, 40 ft x 2; 80 ft at end of session   Assistive device None    Gait Pattern Step-through pattern;Decreased step length - right;Decreased step length - left;Decreased dorsiflexion - right;Decreased dorsiflexion - left;Right flexed knee in stance;Left flexed knee in stance;Decreased trunk rotation;Narrow base of support    Ambulation Surface Level;Indoor    Gait Comments Pt with overall improved step length and heelstrike noted with gait.      High Level Balance   High Level Balance Activities Side stepping;Backward walking    High Level Balance Comments Forward/back walking along counter-3 reps each; heel walking x 2 along counter, toe walking along counter x 2 reps, UE support.   Cues for foot placement, step length     Knee/Hip Exercises: Stretches   Gastroc Stretch Right;Left;3 reps;20 seconds    Gastroc Stretch Limitations foot propped on 4" shelf, cues for technique    Other Knee/Hip Stretches Standing L stretch at counter, for hamstrings, gastrocs with anteiror/posterior lean, x 5 reps   Pt with improved technique, able to maintain more extension through bilat knees     Knee/Hip Exercises: Aerobic   Nustep NuStep, Level 3, 4 extremities, x 5 minutes for flexibility, strengthening of lower extremities; pt keeps steps/min > 45-50      Knee/Hip Exercises: Standing   Forward Step Up Right;Left;1 set;10 reps;Hand Hold: 2;Step Height: 6"    Other Standing Knee Exercises Stagger stance forward/back rocking, 10-12 reps each, cues for technique for upright position through forward/stance hip and "hinge" at hip posture when rocking back    Other Standing Knee Exercises Lateral weigthshift with wide BOS x 10, then 2nd set with weigthshift and ipsalateral reach, x 10 reps.  Single limb stance on ground, with step tap to 12" step, then back to runner's stretch position x 10 reps, for knee flexion/extension on stance leg, BUE support.      Knee/Hip Exercises: Seated   Long Arc Quad Right;Left;1  set;15 reps    Other Seated Knee/Hip Exercises Seated glut sets, 2 sets x 10 reps      Ankle Exercises: Seated   Heel Raises Both;10 reps    Toe Raise 10 reps                    PT Education - 09/08/20 1836     Education Details Additions to HEP-standing  stretches    Person(s) Educated Patient    Methods Explanation;Demonstration;Handout    Comprehension Verbalized understanding;Returned demonstration              PT Short Term Goals - 09/04/20 1630       PT SHORT TERM GOAL #1   Title Pt will be independent with HEP for decreased pain and improved functional strength, transfers, balance,  and gait.  TARGET 09/05/2020    Baseline 09/04/20: met with current program    Status Achieved      PT SHORT TERM GOAL #2   Title Pt will improve 5x sit<>stand to less than or equal to 20 seconds for improved functional lower extremity strength.    Baseline 09/04/20: 16.62 sec's no UE support    Status Achieved      PT SHORT TERM GOAL #3   Title Pt will improve R hamstring ROM by at least 10 degrees for improved flexibility, decreased RLE pain.    Baseline 09/04/20: right LE - 20, left LE -14 ( improved from -26 on right , -22 on left) just not to goal    Status Partially Met      PT SHORT TERM GOAL #4   Title Pt will verbalize/demo at least 3 means to decrease pain in bilat calves and improve stability/decreased pain upon standing.    Baseline 09/04/20: met in session wtih cues    Status Partially Met               PT Long Term Goals - 08/11/20 1553       PT LONG TERM GOAL #1   Title Pt will be independent with progression of HEP for decreased pain, improved mobility, strength, balance.  TARGET for all LTGS: 10/03/2020    Time 8    Period Weeks    Status New      PT LONG TERM GOAL #2   Title Pt will improve 5x sit<>stand to less than or equal to 15 seconds for improved functional strength.    Time 8    Period Weeks    Status New      PT LONG TERM GOAL #3    Title Pt will improve gait velocity to at least 2.62 ft/sec for improved gait efficiency and safety.    Baseline 2.16 ft/sec    Time 8    Period Weeks    Status New      PT LONG TERM GOAL #4   Title Pt will imrpove TUG score and TUG cog score to less than or equal to 10% difference for improved dual tasking with gait.    Baseline 16.9, 23.56    Time 8    Period Weeks    Status New      PT LONG TERM GOAL #5   Title Pt will ambulate at least 15-20 minutes with appropriate standing rest breaks, without c/o increased pain or fatigue for improved gait indpedence in her neighborhood.    Baseline 10 minutes before having to stop    Time 8    Period Weeks    Status New                   Plan - 09/08/20 1837     Clinical Impression Statement Pt continues to work in skilled PT session on more active stretching, flexibility, knee and hip extension strengthening exercises.  She appears to be ambulating with increased step length and heelstrike and with standing exercises appears to have less  crouched position through knees.  She appears to have increased awareness of her movement patterns; she will continue to benefit from further skilled PT to reinforce flexibility, strength, balance and gait training techniques towards LTGs of overall improved functional mobility.    Personal Factors and Comorbidities Comorbidity 3+    Comorbidities asthma, GERD, breast cancer, HTN, cevical spondylosis, lumbar radiculopathy, OA of bilat knees, hx of donating kidney to husband    Examination-Activity Limitations Locomotion Level;Transfers;Stand    Examination-Participation Restrictions Church;Community Activity;Meal Prep    PT Frequency 2x / week    PT Duration 8 weeks   including eval week   PT Treatment/Interventions ADLs/Self Care Home Management;Aquatic Therapy;DME Instruction;Neuromuscular re-education;Balance training;Therapeutic exercise;Therapeutic activities;Functional mobility training;Gait  training;Patient/family education;Manual techniques;Passive range of motion    PT Next Visit Plan Review additions to HEP this visit; continue gastroc and hamstring stretch with strengthening of hips/gluts/quads, ankle musculature, and further standing balance, gait activiteis.  Spoke with Vinnie Level today and she/Denise will follow up with pt to schedule.    PT Home Exercise Plan Access Code: [email or web address]    Consulted and Agree with Plan of Care Patient            10th VISIT PROGRESS NOTE next visit!   Patient will benefit from skilled therapeutic intervention in order to improve the following deficits and impairments:  Abnormal gait, Decreased range of motion, Pain, Decreased balance, Impaired flexibility, Decreased mobility, Decreased strength, Postural dysfunction  Visit Diagnosis: Muscle weakness (generalized)  Abnormal posture  Other abnormalities of gait and mobility     Problem List Patient Active Problem List   Diagnosis Date Noted   Primary osteoarthritis of right knee 05/12/2020   Primary osteoarthritis of left knee 05/12/2020   Hyperlipidemia    Bilateral calf pain 03/05/2019   Genetic testing 02/13/2019   Family history of ovarian cancer    Family history of bladder cancer    Family history of colon cancer    Family history of leukemia    Lumbar radiculopathy 12/08/2018   Myofascial pain 12/08/2018   Impaired gait and mobility 12/08/2018   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   Chronic nonallergic rhinitis 12/29/2016   Mild persistent asthma, uncomplicated 23/30/0762   Vocal fold paralysis, 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   Gastroesophageal reflux disease 12/29/2016   Cervical spondylosis with myelopathy and radiculopathy 11/14/2013   Essential tremor 06/13/2013   Cervical spondylosis without myelopathy 06/13/2013   Breast cancer of lower-outer quadrant of left female breast ([place]) 09/10/2010    Breon Rehm W. 09/08/2020, 6:42 PM Frazier Butt., PT   [facility] [address] [place], [place], [address] Phone: [phone]   Fax:  [phone]  Name: April Church MRN: [id number] Date of Birth: 06/17/1940

## 2020-09-08 NOTE — Patient Instructions (Signed)
Access Code: [email or web address] URL: [email or web address] Date: 09/08/2020 Prepared by: Mady Haagensen  Exercises Supine Bridge - 1 x daily - 5 x weekly - 1 sets - 10 reps Supine Quadricep Sets - 1 x daily - 5 x weekly - 1 sets - 10 reps - 3 sec hold Seated Hamstring Stretch - 1 x daily - 5 x weekly - 1 sets - 3 reps - 15 sec hold Seated March - 1-2 x daily - 5 x weekly - 1-2 sets - 10 reps Seated Long Arc Quad - 1-2 x daily - 5 x weekly - 1-2 sets - 10 reps Seated Ankle Dorsiflexion AROM - 1-2 x daily - 7 x weekly - 1-2 sets - 10 reps  Added 09/08/2020:  Standing posture stretch - 1-2 x daily - 5 x weekly - 1 sets - 5 reps Standing Gastroc Stretch on Step - 1-2 x daily - 5 x weekly - 1 sets - 3 reps - 15 sec hold

## 2020-09-11 ENCOUNTER — Encounter: Payer: Self-pay | Admitting: Physical Therapy

## 2020-09-11 ENCOUNTER — Ambulatory Visit: Payer: Medicare Other | Admitting: Physical Therapy

## 2020-09-11 ENCOUNTER — Other Ambulatory Visit: Payer: Self-pay

## 2020-09-11 DIAGNOSIS — M6281 Muscle weakness (generalized): Secondary | ICD-10-CM | POA: Diagnosis not present

## 2020-09-11 DIAGNOSIS — R293 Abnormal posture: Secondary | ICD-10-CM

## 2020-09-11 DIAGNOSIS — M79662 Pain in left lower leg: Secondary | ICD-10-CM | POA: Diagnosis not present

## 2020-09-11 DIAGNOSIS — R2681 Unsteadiness on feet: Secondary | ICD-10-CM | POA: Diagnosis not present

## 2020-09-11 DIAGNOSIS — M79661 Pain in right lower leg: Secondary | ICD-10-CM | POA: Diagnosis not present

## 2020-09-11 DIAGNOSIS — R2689 Other abnormalities of gait and mobility: Secondary | ICD-10-CM | POA: Diagnosis not present

## 2020-09-11 NOTE — Therapy (Signed)
[facility] [address] [place], [place], [address] Phone: [phone]   Fax:  [phone]  Physical Therapy Treatment  Patient Details  Name: April Church MRN: [id number] Date of Birth: 04-03-1940 Referring Provider (PT): Dr. Courtney Heys   Encounter Date: 09/11/2020   PT End of Session - 09/11/20 1407     Visit Number 10    Number of Visits 16    Date for PT Re-Evaluation 10/03/20    Authorization Type Medicare    Progress Note Due on Visit 10    PT Start Time 2035    PT Stop Time 5974    PT Time Calculation (min) 40 min    Activity Tolerance Patient tolerated treatment well;No increased pain   Pt reports no pain at end of session   Behavior During Therapy [facility] for tasks assessed/performed             Past Medical History:  Diagnosis Date   Asthma    daily inhaler   Breast cancer ([place])    left- radiation and surgery -dx. 2011- no further tx. now- [name] , [name]   Cyst of finger 11/2011   annular cyst right long finger   Dental crowns present    Dermatitis    Family history of bladder cancer    Family history of colon cancer    Family history of leukemia    Family history of ovarian cancer    Frequency of urination    GERD (gastroesophageal reflux disease)    Hemorrhoid    Hyperlipidemia    Hypertension    under control, has been on med. x 2 yrs.   PONV (postoperative nausea and vomiting)    Seasonal allergies    Tremors of nervous system    hands   Trigger finger of right hand 11/2011   long finger    Past Surgical History:  Procedure Laterality Date   ANTERIOR CERVICAL DECOMPRESSION/DISCECTOMY FUSION 4 LEVELS N/A 11/14/2013   Procedure: ANTERIOR CERVICAL DECOMPRESSION/DISCECTOMY FUSION 4 LEVELS;  Surgeon: Newman Pies, MD;  Location: [facility];  Service: Neurosurgery;  Laterality: N/A;  C34 C45 C56 C67 anterior cervical fusion with interbody prosthesis plating and   bonegraft   APPENDECTOMY  age 31   BREAST LUMPECTOMY  06/16/2009   left; SLN bx.   BREAST LUMPECTOMY  07/01/2009   re-excision   BREAST SURGERY  1999   reduction   CATARACT EXTRACTION, BILATERAL     COLONOSCOPY WITH PROPOFOL N/A 12/03/2014   Procedure: COLONOSCOPY WITH PROPOFOL;  Surgeon: [name], MD;  Location: [facility];  Service: Endoscopy;  Laterality: N/A;   FOOT SURGERY  06/22/11   left   KNEE ARTHROSCOPY  03/12/2005   right   KNEE ARTHROSCOPY     left   NASAL SINUS SURGERY     x 2   NEPHRECTOMY LIVING DONOR Left 04/2008   donated to spouse 2010([facility])   TRIGGER FINGER RELEASE  12/16/2011   Procedure: RELEASE TRIGGER FINGER/A-1 PULLEY;  Surgeon: Tennis Must, MD;  Location: [facility];  Service: Orthopedics;  Laterality: Right;  RIGHT LONG FINGER TRIGGER RELEASE & ANNULAR CYST EXCISION   TUMOR EXCISION  age 31   right arm    There were no vitals filed for this visit.   Subjective Assessment - 09/11/20 1405     Subjective Was standing trying to get some things together and fell backwards.  Not sure what happened or why.  Had a little difficulty getting up and I'm a little sore today.    Limitations Walking;Standing    How long can you stand comfortably? < 5 minutes    How long can you walk comfortably? 10 minutes or less    Patient Stated Goals Pt's goals for therapy are to get rid of pain, and improve strength.    Currently in Pain? Yes    Pain Score 2     Pain Location Calf    Pain Orientation Right;Left    Pain Descriptors / Indicators Aching;Sore    Pain Type Chronic pain    Pain Onset More than a month ago    Pain Frequency Intermittent    Aggravating Factors  increased activity, upon first standing after sitting too long    Pain Relieving Factors moving generally helps and exercises    Multiple Pain Sites Yes    Pain Score 4    Pain Location Hip    Pain Orientation Left;Right    Pain Descriptors / Indicators Aching;Sore    Pain  Type Acute pain    Pain Onset Yesterday   due to a fall   Pain Frequency Intermittent    Aggravating Factors  fall yesterday    Pain Relieving Factors heat, tylenol                               [facility] Adult PT Treatment/Exercise - 09/11/20 0001       Transfers   Transfers Stand to Sit;Sit to Stand    Sit to Stand 5: Supervision;Without upper extremity assist;From chair/3-in-1;From bed    Stand to Sit 5: Supervision;Without upper extremity assist;To chair/3-in-1    Number of Reps 1 set;Other reps (comment)   5 reps     Ambulation/Gait   Ambulation/Gait Yes    Ambulation/Gait Assistance 5: Supervision    Ambulation/Gait Assistance Details Cues for tall posture through knees    Ambulation Distance (Feet) 100 Feet   200, 230   Assistive device None    Gait Pattern Step-through pattern;Decreased step length - right;Decreased step length - left;Decreased dorsiflexion - right;Decreased dorsiflexion - left;Right flexed knee in stance;Left flexed knee in stance;Decreased trunk rotation;Narrow base of support    Ambulation Surface Level;Indoor    Gait velocity 12.10 sec = 2.71 ft/sec    Gait Comments 115 ft with quick stop/starts with PT supervision, PT cues for widened BOS.      Standardized Balance Assessment   Standardized Balance Assessment Timed Up and Go Test      Timed Up and Go Test   TUG Normal TUG    Normal TUG (seconds) 13.16      Self-Care   Self-Care Other Self-Care Comments    Other Self-Care Comments  Discussed pt's recent fall:  she reports it was when she was standing at counter and she doesn't know what happened, but she fell backwards.  She hit her head and her hips, denies LOC and reports slight pain today in low back/hip area, but does not increase with activity in PT session.  PT discussed that pt should let MD know about this fall and to seek emergent treatment if she has headache, cognitive changes, or any increase in weakness or pain.  Discussed  with static standing to have widened BOS or wide staggered stance and not stand with narrow BOS.      Exercises   Exercises Other Exercises    Other Exercises  Assessed ROM in supine 90/90 with RLE passively 20 degrees from full knee extension, LLE passively 18 degrees from full extension.      Knee/Hip Exercises: Stretches   Active Hamstring Stretch Right;Left;3 reps;30 seconds    Active Hamstring Stretch Limitations foot propped on floor    Other Knee/Hip Stretches Also performed/reviewed stretches added to HEP last visit, with pt return demo understanding.      Knee/Hip Exercises: Standing   Other Standing Knee Exercises Stagger stance forward/back rocking, 15 reps each, cues for technique for upright position through forward/stance hip and "hinge" at hip posture when rocking back    Other Standing Knee Exercises Lateral weigthshift with wide BOS x 10,      Ankle Exercises: Standing   Heel Raises Both;10 reps;3 seconds                      PT Short Term Goals - 09/04/20 1630       PT SHORT TERM GOAL #1   Title Pt will be independent with HEP for decreased pain and improved functional strength, transfers, balance,  and gait.  TARGET 09/05/2020    Baseline 09/04/20: met with current program    Status Achieved      PT SHORT TERM GOAL #2   Title Pt will improve 5x sit<>stand to less than or equal to 20 seconds for improved functional lower extremity strength.    Baseline 09/04/20: 16.62 sec's no UE support    Status Achieved      PT SHORT TERM GOAL #3   Title Pt will improve R hamstring ROM by at least 10 degrees for improved flexibility, decreased RLE pain.    Baseline 09/04/20: right LE - 20, left LE -14 ( improved from -26 on right , -22 on left) just not to goal    Status Partially Met      PT SHORT TERM GOAL #4   Title Pt will verbalize/demo at least 3 means to decrease pain in bilat calves and improve stability/decreased pain upon standing.    Baseline 09/04/20:  met in session wtih cues    Status Partially Met               PT Long Term Goals - 09/11/20 1504       PT LONG TERM GOAL #1   Title Pt will be independent with progression of HEP for decreased pain, improved mobility, strength, balance.  TARGET for all LTGS: 10/03/2020    Time 8    Period Weeks    Status New      PT LONG TERM GOAL #2   Title Pt will improve 5x sit<>stand to less than or equal to 15 seconds for improved functional strength.    Time 8    Period Weeks    Status New      PT LONG TERM GOAL #3   Title Pt will improve gait velocity to at least 2.62 ft/sec for improved gait efficiency and safety.    Baseline 2.16 ft/sec; 2.71 ft/sec 09/11/2020    Time 8    Period Weeks    Status Achieved      PT LONG TERM GOAL #4   Title Pt will imrpove TUG score and TUG cog score to less than or equal to 10% difference for improved dual tasking with gait.    Baseline 16.9, 23.56    Time 8    Period Weeks    Status New  PT LONG TERM GOAL #5   Title Pt will ambulate at least 15-20 minutes with appropriate standing rest breaks, without c/o increased pain or fatigue for improved gait indpedence in her neighborhood.    Baseline 10 minutes before having to stop    Time 8    Period Weeks    Status New                   Plan - 09/11/20 1456     Clinical Impression Statement 10th Visit Progress Note, covering dates from 08/11/2020-09/11/2020.[name]  Pt subjectively reports less pain overall than at beginning of PT (initially at eval, pain was 8/10, consistently, pt reports 2/10).  Objective measures:  5x sit<>stand:  16 sec (improved from 24.81 at eval), TUG score 13.19 sec (improved from 16.9 sec at eval), gait velocity 2.71 ft/sec (improved from 2.16 ft/sec at eval).  Hamstring flexibility in supine 90/90 position passively:  RLE lacks 20 degrees from knee extension (compared to 26 from extended position at eval); LLE lacks 18degrees from knee extension (compared to 22 from  extended position at initial eval).  Pt is improving with flexibility and improving with functional measures.  She does continue to stand and walk in flexed knee position, but she is taking increased step length and having more heelstrike with gait.  She does report having a fall yesterday, unsure of cause, but she did fall backwards.  PT did not have time to fully assess this visit, but did discuss how pt can work on standing more safely in static position at home. With recent fall, continued muscle tightness and decreased functional strength, as well as continued (but less) bilat calf pain, pt would benefit from skilled PT to fruther address flexibility, strength and gait with additional focus on balance.    Personal Factors and Comorbidities Comorbidity 3+    Comorbidities asthma, GERD, breast cancer, HTN, cevical spondylosis, lumbar radiculopathy, OA of bilat knees, hx of donating kidney to husband    Examination-Activity Limitations Locomotion Level;Transfers;Stand    Examination-Participation Restrictions Church;Community Activity;Meal Prep    PT Frequency 2x / week    PT Duration 8 weeks   including eval week   PT Treatment/Interventions ADLs/Self Care Home Management;Aquatic Therapy;DME Instruction;Neuromuscular re-education;Balance training;Therapeutic exercise;Therapeutic activities;Functional mobility training;Gait training;Patient/family education;Manual techniques;Passive range of motion    PT Next Visit Plan Continue gastroc and hamstring stretch with strengthening of hips/gluts/quads, ankle musculature, and work on standing static and dynamic balance and gait activiteis.  Spoke with Vinnie Level again today and she/Denise will follow up with pt to schedule.    PT Home Exercise Plan Access Code: [email or web address]    Consulted and Agree with Plan of Care Patient             Patient will benefit from skilled therapeutic intervention in order to improve the following deficits and impairments:   Abnormal gait, Decreased range of motion, Pain, Decreased balance, Impaired flexibility, Decreased mobility, Decreased strength, Postural dysfunction  Visit Diagnosis: Muscle weakness (generalized)  Abnormal posture  Other abnormalities of gait and mobility     Problem List Patient Active Problem List   Diagnosis Date Noted   Primary osteoarthritis of right knee 05/12/2020   Primary osteoarthritis of left knee 05/12/2020   Hyperlipidemia    Bilateral calf pain 03/05/2019   Genetic testing 02/13/2019   Family history of ovarian cancer    Family history of bladder cancer    Family history of colon cancer    Family history of leukemia  Lumbar radiculopathy 12/08/2018   Myofascial pain 12/08/2018   Impaired gait and mobility 12/08/2018   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   Chronic nonallergic rhinitis 12/29/2016   Mild persistent asthma, uncomplicated 39/43/2003   Vocal fold paralysis, 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   Gastroesophageal reflux disease 12/29/2016   Cervical spondylosis with myelopathy and radiculopathy 11/14/2013   Essential tremor 06/13/2013   Cervical spondylosis without myelopathy 06/13/2013   Breast cancer of lower-outer quadrant of left female breast ([place]) 09/10/2010    Sergi Gellner W. 09/11/2020, 3:07 PM Frazier Butt., PT   [facility] [address] [place], [place], [address] Phone: [phone]   Fax:  [phone]  Name: MARIN WISNER MRN: [id number] Date of Birth: 11/14/1940

## 2020-09-12 ENCOUNTER — Encounter
Payer: Medicare Other | Attending: Physical Medicine and Rehabilitation | Admitting: Physical Medicine and Rehabilitation

## 2020-09-12 ENCOUNTER — Encounter: Payer: Self-pay | Admitting: Physical Medicine and Rehabilitation

## 2020-09-12 VITALS — BP 133/91 | HR 95 | Temp 97.5°F | Ht 60.0 in | Wt 125.8 lb

## 2020-09-12 DIAGNOSIS — M5416 Radiculopathy, lumbar region: Secondary | ICD-10-CM | POA: Insufficient documentation

## 2020-09-12 DIAGNOSIS — M1712 Unilateral primary osteoarthritis, left knee: Secondary | ICD-10-CM | POA: Insufficient documentation

## 2020-09-12 DIAGNOSIS — M7918 Myalgia, other site: Secondary | ICD-10-CM | POA: Insufficient documentation

## 2020-09-12 DIAGNOSIS — M1711 Unilateral primary osteoarthritis, right knee: Secondary | ICD-10-CM | POA: Insufficient documentation

## 2020-09-12 MED ORDER — TRAMADOL HCL 50 MG PO TABS: 50.0000 mg | ORAL_TABLET | Freq: Two times a day (BID) | ORAL | 1 refills | Status: DC | PRN

## 2020-09-12 MED ORDER — VENLAFAXINE HCL 75 MG PO TABS: 75.0000 mg | ORAL_TABLET | Freq: Every day | ORAL | 1 refills | Status: AC

## 2020-09-12 NOTE — Progress Notes (Signed)
Subjective:    Patient ID: April Church, female    DOB: November 17, 1940, 80 y.o.   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 09/18/19 Back injection- R L4-5 transforminal.  epidural steroid injection.  Last B/L knee injections 05/12/20 Here for f/u on chronic pain.   Had fall Tuesday- doesn't know why fell- standing there and next thing she knew, she was on floor- hit back of head on back of floor   Yesterday, felt like a wreck all over. Better today. Back of head not hurting anymore and neck less so (not on blood thinners).  Hurting a lot more last 2 days overall.  Doesn't know why fell (lives alone).  Felt out of it afterwards, so likely passed out.   Doesn't feel like needs to B/L knees need injections today. L knee is a little stiff, but not hurting IN the knee.   Has tried Tramadol-  Also tried extra strength tylenol- helped "just as well"- so doesn't want to get refill.  Hasn't taken pain meds in past except for surgical  pain and stops it before doctor does.    Doesn't have any tramadol left.  Only got 20 pills last time    Walking straighter with therapy- PT- got another assessment yesterday. Leg/knee extensions have also improved.  Have added 2 more weeks scheduled.    Pain Inventory Average Pain 6 Pain Right Now 4 My pain is aching  In the last 24 hours, has pain interfered with the following? General activity 4 Relation with others 4 Enjoyment of life 4 What TIME of day is your pain at its worst? morning  Sleep (in general) Fair  Pain is worse with: walking and standing Pain improves with: heat/ice and medication Relief from Meds: 5  Family History  Problem Relation Age of Onset    Kidney disease Mother    Stroke Father    Stroke Sister        08/2015   Diabetes Brother    Heart disease Brother    Ovarian cancer Sister 6   Heart disease Paternal Grandmother    Heart disease Paternal Grandfather    Leukemia Other 64       brother's grandson   Bladder Cancer Brother 23   Colon cancer Niece        dx in late 38s   Social History   Socioeconomic History   Marital status: Married    Spouse name: Marcello Moores    Number of children: 1   Years of education: HS   Highest education level: Not on file  Occupational History   Occupation: Retired  Tobacco Use   Smoking status: Never   Smokeless tobacco: Never  Substance and Sexual Activity   Alcohol use: No    Alcohol/week: 0.0 standard drinks   Drug use: No   Sexual activity: Yes  Other Topics Concern   Not on file  Social History Narrative      Pt lives at home with her spouse.  [name]    Patient has 1 child.    Patient is retired.    Patient has a HS education.    Patient is right handed.    Patient drinks  caffeine occasionally.              Social Determinants of Health   Financial Resource Strain: Not on file  Food Insecurity: Not on file  Transportation Needs: Not on file  Physical Activity: Not on file  Stress: Not on file  Social Connections: Not on file   Past Surgical History:  Procedure Laterality Date   ANTERIOR CERVICAL DECOMPRESSION/DISCECTOMY FUSION 4 LEVELS N/A 11/14/2013   Procedure: ANTERIOR CERVICAL DECOMPRESSION/DISCECTOMY FUSION 4 LEVELS;  Surgeon: Newman Pies, MD;  Location: [facility];  Service: Neurosurgery;  Laterality: N/A;  C34 C45 C56 C67 anterior cervical fusion with interbody prosthesis plating and  bonegraft   APPENDECTOMY  age 53   BREAST LUMPECTOMY  06/16/2009   left; SLN bx.   BREAST LUMPECTOMY  07/01/2009   re-excision   BREAST SURGERY  1999   reduction   CATARACT EXTRACTION, BILATERAL     COLONOSCOPY WITH PROPOFOL N/A 12/03/2014   Procedure: COLONOSCOPY  WITH PROPOFOL;  Surgeon: [name], MD;  Location: [facility];  Service: Endoscopy;  Laterality: N/A;   FOOT SURGERY  06/22/11   left   KNEE ARTHROSCOPY  03/12/2005   right   KNEE ARTHROSCOPY     left   NASAL SINUS SURGERY     x 2   NEPHRECTOMY LIVING DONOR Left 04/2008   donated to spouse 2010([facility])   TRIGGER FINGER RELEASE  12/16/2011   Procedure: RELEASE TRIGGER FINGER/A-1 PULLEY;  Surgeon: Tennis Must, MD;  Location: [facility];  Service: Orthopedics;  Laterality: Right;  RIGHT LONG FINGER TRIGGER RELEASE & ANNULAR CYST EXCISION   TUMOR EXCISION  age 49   right arm   Past Surgical History:  Procedure Laterality Date   ANTERIOR CERVICAL DECOMPRESSION/DISCECTOMY FUSION 4 LEVELS N/A 11/14/2013   Procedure: ANTERIOR CERVICAL DECOMPRESSION/DISCECTOMY FUSION 4 LEVELS;  Surgeon: Newman Pies, MD;  Location: [facility];  Service: Neurosurgery;  Laterality: N/A;  C34 C45 C56 C67 anterior cervical fusion with interbody prosthesis plating and  bonegraft   APPENDECTOMY  age 79   BREAST LUMPECTOMY  06/16/2009   left; SLN bx.   BREAST LUMPECTOMY  07/01/2009   re-excision   BREAST SURGERY  1999   reduction   CATARACT EXTRACTION, BILATERAL     COLONOSCOPY WITH PROPOFOL N/A 12/03/2014   Procedure: COLONOSCOPY WITH PROPOFOL;  Surgeon: [name], MD;  [facility];  Service: Endoscopy;  Laterality: N/A;   FOOT SURGERY  06/22/11   left   KNEE ARTHROSCOPY  03/12/2005   right   KNEE ARTHROSCOPY     left   NASAL SINUS SURGERY     x 2   NEPHRECTOMY LIVING DONOR Left 04/2008   donated to spouse 2010([facility])   TRIGGER FINGER RELEASE  12/16/2011   Procedure: RELEASE TRIGGER FINGER/A-1 PULLEY;  Surgeon: Tennis Must, MD;  Location: [facility];  Service: Orthopedics;  Laterality: Right;  RIGHT LONG FINGER TRIGGER RELEASE & ANNULAR CYST EXCISION   TUMOR EXCISION  age 3   right arm   Past Medical History:  Diagnosis Date   Asthma     daily inhaler   Breast cancer ([facility])    left- radiation and surgery -dx. 2011- no further tx. now- [name] , [name]   Cyst of finger 11/2011   annular cyst right long finger   Dental crowns present    Dermatitis    Family history of bladder cancer    Family history  of colon cancer    Family history of leukemia    Family history of ovarian cancer    Frequency of urination    GERD (gastroesophageal reflux disease)    Hemorrhoid    Hyperlipidemia    Hypertension    under control, has been on med. x 2 yrs.   PONV (postoperative nausea and vomiting)    Seasonal allergies    Tremors of nervous system    hands   Trigger finger of right hand 11/2011   long finger   BP (!) 133/91 (BP Location: Left Arm, Patient Position: Sitting, Cuff Size: Normal)   Pulse 95   Temp (!) 97.5 F (36.4 C) (Oral)   Ht 5' (1.524 m)   Wt 125 lb 12.8 oz (57.1 kg)   SpO2 96%   BMI 24.57 kg/m   Opioid Risk Score:   Fall Risk Score:  `1  Depression screen PHQ 2/9  Depression screen PHQ 2/9 05/12/2020  Decreased Interest 1  Down, Depressed, Hopeless 1  PHQ - 2 Score 2  Some recent data might be hidden      Review of Systems  Musculoskeletal:  Positive for back pain and neck pain.       Bilateral calf  All other systems reviewed and are negative.     Objective:   Physical Exam Awake, alert, appropriate, hoarse wavering voice, NAD No assistive device Cognitively, Ox3 and answering and asking questions appropriately. -So no signs of brain injury No bruising on back of head or back, but does have some mild to moderate swelling around Sacrum where it was hurting- Has some moderate effusion medially and laterally noted of L knee, none in R knee- Chronic Crepitus with ROM B/L  but no TTP B/L  Leaning slightly to right on table.      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 09/18/19 Back injection- R L4-5 transforminal.  epidural steroid injection.  Last B/L knee injections 05/12/20 Here for f/u as well as eval for B/L calf pain/LE weakness    Will refill tramadol for fall pains and to take when extra strength tylenol not enough- 50 mg 2x/day as needed #30 1 refill; If needs refill on Tramadol, please call- don't wait for appointment to get refill.  I'm concerned as to why she fell- I think she passed out- needs a work up for passing out, since it's concerning- no "reason" is obvious. call PCP today to get work up started. Likely needs an EKG and ECHO, etc. Needs cardiac work up before assumes this is due to dehydration only.   Will need Life alert or something if has another fall. Needs ot wear something. Didn't trip or need a new assistive device since tripping wasn't the cause of the fall.   Doesn't feel like she needs B/L knee injections this visit- but will schedule for next visit-  F/U in 3 months- for B/L knee injections and f/u.  Refilled Effexor 75 mg daily 3 month supply with 1 refill. I f therapy needs more time; have them let me know and I can write additional prescriptions.  We all over 50 have decreased sensation of needing to drink/thirsty, so please  maintain fluid intake- don't go crazy on iced tea- needs more non-caffeinated beverages otherwise- no more than 1 tea per day.    I spent a total of 32 minutes on visit- discussing fall and fall prevention. And hydration. However still needs cardiac work up!

## 2020-09-12 NOTE — Patient Instructions (Addendum)
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 09/18/19 Back injection- R L4-5 transforminal.  epidural steroid injection.  Last B/L knee injections 05/12/20 Here for f/u as well as eval for B/L calf pain/LE weakness    Will refill tramadol for fall pains and to take when extra strength tylenol not enough- 50 mg 2x/day as needed #30 1 refill; If needs refill on Tramadol, please call- don't wait for appointment to get refill.  I'm concerned as to why she fell- I think she passed out- needs a work up for passing out, since it's concerning- no "reason" is obvious. Asked her to  call PCP today to get work up started. Likely needs an EKG and ECHO, etc. Needs cardiac work up before assumes this is due to dehydration only.   Will need Life alert or something if has another fall. Needs ot wear something. Didn't trip or need a new assistive device since tripping wasn't the cause of the fall.   Doesn't feel like she needs B/L knee injections this visit- but will schedule for next visit-  F/U in 3 months- for B/L knee injections and f/u.  6.   Refilled Effexor/Vanlafaxine 75 mg daily- 3 months supply with 1 refill, so 6 months.  7. If therapy needs more time, have them let me know/for new additional time/prescriptions.  8. We all over 50 have decreased sensation of needing to drink/thirsty, so please maintain fluid intake- don't go crazy on iced tea- needs more non-caffeinated beverages otherwise- no more than 1 tea per day.

## 2020-09-15 DIAGNOSIS — Z01419 Encounter for gynecological examination (general) (routine) without abnormal findings: Secondary | ICD-10-CM | POA: Diagnosis not present

## 2020-09-15 DIAGNOSIS — Z124 Encounter for screening for malignant neoplasm of cervix: Secondary | ICD-10-CM | POA: Diagnosis not present

## 2020-09-15 DIAGNOSIS — Z01411 Encounter for gynecological examination (general) (routine) with abnormal findings: Secondary | ICD-10-CM | POA: Diagnosis not present

## 2020-09-15 DIAGNOSIS — N83292 Other ovarian cyst, left side: Secondary | ICD-10-CM | POA: Diagnosis not present

## 2020-09-15 DIAGNOSIS — Z6823 Body mass index (BMI) 23.0-23.9, adult: Secondary | ICD-10-CM | POA: Diagnosis not present

## 2020-09-15 DIAGNOSIS — D259 Leiomyoma of uterus, unspecified: Secondary | ICD-10-CM | POA: Diagnosis not present

## 2020-09-16 ENCOUNTER — Encounter: Payer: Self-pay | Admitting: Genetic Counselor

## 2020-09-17 ENCOUNTER — Ambulatory Visit: Payer: Medicare Other | Admitting: Physical Therapy

## 2020-09-17 ENCOUNTER — Other Ambulatory Visit: Payer: Self-pay | Admitting: Internal Medicine

## 2020-09-17 ENCOUNTER — Ambulatory Visit
Admission: RE | Admit: 2020-09-17 | Discharge: 2020-09-17 | Disposition: A | Discharge status: Home / Self Care | Payer: Medicare Other | Source: Ambulatory Visit | Attending: Internal Medicine | Admitting: Internal Medicine

## 2020-09-17 DIAGNOSIS — M7918 Myalgia, other site: Secondary | ICD-10-CM

## 2020-09-17 DIAGNOSIS — R0782 Intercostal pain: Secondary | ICD-10-CM | POA: Diagnosis not present

## 2020-09-17 DIAGNOSIS — I499 Cardiac arrhythmia, unspecified: Secondary | ICD-10-CM | POA: Diagnosis not present

## 2020-09-19 ENCOUNTER — Ambulatory Visit: Payer: Medicare Other | Admitting: Physical Therapy

## 2020-09-19 ENCOUNTER — Other Ambulatory Visit: Payer: Self-pay | Admitting: Obstetrics and Gynecology

## 2020-09-19 DIAGNOSIS — D259 Leiomyoma of uterus, unspecified: Secondary | ICD-10-CM

## 2020-09-22 ENCOUNTER — Ambulatory Visit: Payer: Medicare Other | Admitting: Physical Therapy

## 2020-09-23 DIAGNOSIS — Z20822 Contact with and (suspected) exposure to covid-19: Secondary | ICD-10-CM | POA: Diagnosis not present

## 2020-09-24 ENCOUNTER — Ambulatory Visit: Payer: Medicare Other | Admitting: Physical Therapy

## 2020-09-24 DIAGNOSIS — I10 Essential (primary) hypertension: Secondary | ICD-10-CM | POA: Diagnosis not present

## 2020-09-24 DIAGNOSIS — J45909 Unspecified asthma, uncomplicated: Secondary | ICD-10-CM | POA: Diagnosis not present

## 2020-09-24 DIAGNOSIS — G20 Parkinson's disease: Secondary | ICD-10-CM | POA: Diagnosis not present

## 2020-09-24 DIAGNOSIS — E785 Hyperlipidemia, unspecified: Secondary | ICD-10-CM | POA: Diagnosis not present

## 2020-09-24 DIAGNOSIS — G4700 Insomnia, unspecified: Secondary | ICD-10-CM | POA: Diagnosis not present

## 2020-09-24 DIAGNOSIS — F3341 Major depressive disorder, recurrent, in partial remission: Secondary | ICD-10-CM | POA: Diagnosis not present

## 2020-09-24 DIAGNOSIS — M4726 Other spondylosis with radiculopathy, lumbar region: Secondary | ICD-10-CM | POA: Diagnosis not present

## 2020-09-25 ENCOUNTER — Ambulatory Visit: Payer: Medicare Other | Admitting: Physical Therapy

## 2020-09-30 DIAGNOSIS — R55 Syncope and collapse: Secondary | ICD-10-CM | POA: Diagnosis not present

## 2020-09-30 DIAGNOSIS — M4726 Other spondylosis with radiculopathy, lumbar region: Secondary | ICD-10-CM | POA: Diagnosis not present

## 2020-09-30 DIAGNOSIS — R2681 Unsteadiness on feet: Secondary | ICD-10-CM | POA: Diagnosis not present

## 2020-09-30 DIAGNOSIS — R29898 Other symptoms and signs involving the musculoskeletal system: Secondary | ICD-10-CM | POA: Diagnosis not present

## 2020-10-01 LAB — PACEMAKER DEVICE OBSERVATION

## 2020-10-03 ENCOUNTER — Encounter
Payer: Medicare Other | Attending: Physical Medicine and Rehabilitation | Admitting: Physical Medicine and Rehabilitation

## 2020-10-03 ENCOUNTER — Other Ambulatory Visit: Payer: Self-pay

## 2020-10-03 ENCOUNTER — Encounter: Payer: Self-pay | Admitting: Physical Medicine and Rehabilitation

## 2020-10-03 VITALS — BP 144/90 | HR 95 | Temp 98.4°F | Ht 60.0 in | Wt 129.0 lb

## 2020-10-03 DIAGNOSIS — M1712 Unilateral primary osteoarthritis, left knee: Secondary | ICD-10-CM | POA: Insufficient documentation

## 2020-10-03 DIAGNOSIS — R2689 Other abnormalities of gait and mobility: Secondary | ICD-10-CM | POA: Insufficient documentation

## 2020-10-03 DIAGNOSIS — M1711 Unilateral primary osteoarthritis, right knee: Secondary | ICD-10-CM | POA: Insufficient documentation

## 2020-10-03 DIAGNOSIS — M79662 Pain in left lower leg: Secondary | ICD-10-CM | POA: Diagnosis not present

## 2020-10-03 DIAGNOSIS — M79661 Pain in right lower leg: Secondary | ICD-10-CM | POA: Diagnosis not present

## 2020-10-03 NOTE — Patient Instructions (Signed)
Plan: Based on search, should be able to take Tramadol 1x/day at bedtime- (only takes Sinemet 2x/day and also takes Primidone)- but don't take more than that. So try Tramadol but let me know if there's an issue. If has side effect OR it doesn't work, will have to change to another medicine.   2. steroid injection was performed at B/L knee steroid injections using 1% plain Lidocaine and '40mg'$  /1cc of Kenalog. This was well tolerated.  Cleaned with betadine x3 and allowed to dry- then alcohol then injected using 27 gauge 1.5 inch needle- no bleeding or complications.    F/U in 3 months for steroid injections of B/L knee steroid injections   Lidocaine will kick in 15 minutes- and wear off tonight- the steroid will kick in tomorrow within 24 hours and take up to 72 hours to fully kick in.  3. Try rolling pin - no more than 10 minutes/leg- can use daily but most of my patients do ~3 days/week- can roll front and back of calves B/L - toward heart and miss the bones :)  4. Lidocaine patches- can use over the counter- and place 1 on each calf - max 3 at a time- bed time to morning. 12 hours max- and then take it off.   5. F/U 3 months for B/L knee injections and overall f/u.

## 2020-10-03 NOTE — Progress Notes (Signed)
  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 09/18/19 Back injection- R L4-5 transforminal.  epidural steroid injection.  Last B/L knee injections 05/12/20 Here for f/u as well as eval for B/L calf pain/LE weakness  Got heart monitor- Zio patch- 14 days- will be 10 days until hears anything.   No therapy until got results of Zio patch for now-  Waiting to see therapy again.   Knees are really hurting- B/L  Also having pain on calves posteriorly as well.   Last epidural steroid injections-7/21- but isn't having back pain- just leg pain.    Hasn't taken Tramadol-because told by pharmacist  to avoid with parkinson's medications.    Plan: Based on search, should be able to take Tramadol 1x/day at bedtime- (only takes Sinemet 2x/day and also takes Primidone)- but don't take more than that. So try Tramadol but let me know if there's an issue. If has side effect OR it doesn't work, will have to change to another medicine.   2. steroid injection was performed at B/L knee steroid injections using 1% plain Lidocaine and '40mg'$  /1cc of Kenalog. This was well tolerated.  Cleaned with betadine x3 and allowed to dry- then alcohol then injected using 27 gauge 1.5 inch needle- no bleeding or complications.    F/U in 3 months for steroid injections of B/L knee steroid injections   Lidocaine will kick in 15 minutes- and wear off tonight- the steroid will kick in tomorrow within 24 hours and take up to 72 hours to fully kick in.  3. Try rolling pin - no more than 10 minutes/leg- can use daily but most of my patients do ~3 days/week- can roll front and back of calves B/L - toward heart and miss the bones :)  4. Lidocaine patches- can use over the  counter- and place 1 on each calf - max 3 at a time- bed time to morning. 12 hours max- and then take it off.   5. F/U 3 months for B/L knee injections and overall f/u.

## 2020-10-05 ENCOUNTER — Other Ambulatory Visit: Payer: Medicare Other

## 2020-10-06 ENCOUNTER — Other Ambulatory Visit: Payer: Self-pay

## 2020-10-06 ENCOUNTER — Ambulatory Visit
Admission: RE | Admit: 2020-10-06 | Discharge: 2020-10-06 | Disposition: A | Discharge status: Home / Self Care | Payer: Medicare Other | Source: Ambulatory Visit | Attending: Obstetrics and Gynecology | Admitting: Obstetrics and Gynecology

## 2020-10-06 DIAGNOSIS — D259 Leiomyoma of uterus, unspecified: Secondary | ICD-10-CM | POA: Diagnosis not present

## 2020-10-06 DIAGNOSIS — I499 Cardiac arrhythmia, unspecified: Secondary | ICD-10-CM | POA: Diagnosis not present

## 2020-10-06 MED ORDER — GADOBENATE DIMEGLUMINE 529 MG/ML IV SOLN
11.0000 mL | Freq: Once | INTRAVENOUS | Status: AC | PRN
  Administered 2020-10-06: 11 mL via INTRAVENOUS

## 2020-10-07 DIAGNOSIS — I499 Cardiac arrhythmia, unspecified: Secondary | ICD-10-CM | POA: Diagnosis not present

## 2020-10-09 ENCOUNTER — Ambulatory Visit: Payer: Medicare Other | Attending: Internal Medicine | Admitting: Physical Therapy

## 2020-10-09 ENCOUNTER — Other Ambulatory Visit: Payer: Self-pay

## 2020-10-09 DIAGNOSIS — R2681 Unsteadiness on feet: Secondary | ICD-10-CM | POA: Diagnosis not present

## 2020-10-09 DIAGNOSIS — R293 Abnormal posture: Secondary | ICD-10-CM | POA: Insufficient documentation

## 2020-10-09 DIAGNOSIS — R29818 Other symptoms and signs involving the nervous system: Secondary | ICD-10-CM | POA: Insufficient documentation

## 2020-10-09 DIAGNOSIS — R2689 Other abnormalities of gait and mobility: Secondary | ICD-10-CM | POA: Diagnosis not present

## 2020-10-09 DIAGNOSIS — M79661 Pain in right lower leg: Secondary | ICD-10-CM | POA: Diagnosis not present

## 2020-10-09 DIAGNOSIS — M79662 Pain in left lower leg: Secondary | ICD-10-CM | POA: Diagnosis not present

## 2020-10-09 DIAGNOSIS — M6281 Muscle weakness (generalized): Secondary | ICD-10-CM | POA: Diagnosis not present

## 2020-10-09 NOTE — Therapy (Signed)
[facility] [address] [place], [place], [address] Phone: [phone]   Fax:  [phone]  Physical Therapy Treatment/Rev-eval/Recert Visit  Patient Details  Name: April Church MRN: [id number] Date of Birth: September 09, 1940 Referring Provider (PT): Dr. Courtney Heys   Encounter Date: 10/09/2020   PT End of Session - 10/09/20 0937     Visit Number 11    Number of Visits 26    Date for PT Re-Evaluation 11/28/20    Authorization Type Medicare    Progress Note Due on Visit 20    PT Start Time 0935    PT Stop Time 1015    PT Time Calculation (min) 40 min    Activity Tolerance Patient tolerated treatment well;No increased pain    Behavior During Therapy WFL for tasks assessed/performed             Past Medical History:  Diagnosis Date   Asthma    daily inhaler   Breast cancer ([place])    left- radiation and surgery -dx. 2011- no further tx. now- [name] , [name]   Cyst of finger 11/2011   annular cyst right long finger   Dental crowns present    Dermatitis    Family history of bladder cancer    Family history of colon cancer    Family history of leukemia    Family history of ovarian cancer    Frequency of urination    GERD (gastroesophageal reflux disease)    Hemorrhoid    Hyperlipidemia    Hypertension    under control, has been on med. x 2 yrs.   PONV (postoperative nausea and vomiting)    Seasonal allergies    Tremors of nervous system    hands   Trigger finger of right hand 11/2011   long finger    Past Surgical History:  Procedure Laterality Date   ANTERIOR CERVICAL DECOMPRESSION/DISCECTOMY FUSION 4 LEVELS N/A 11/14/2013   Procedure: ANTERIOR CERVICAL DECOMPRESSION/DISCECTOMY FUSION 4 LEVELS;  Surgeon: Newman Pies, MD;  Location: [facility];  Service: Neurosurgery;  Laterality: N/A;  C34 C45 C56 C67 anterior cervical fusion with interbody prosthesis plating and  bonegraft    APPENDECTOMY  age 27   BREAST LUMPECTOMY  06/16/2009   left; SLN bx.   BREAST LUMPECTOMY  07/01/2009   re-excision   BREAST SURGERY  1999   reduction   CATARACT EXTRACTION, BILATERAL     COLONOSCOPY WITH PROPOFOL N/A 12/03/2014   Procedure: COLONOSCOPY WITH PROPOFOL;  Surgeon: [name], MD;  Location: [facility];  Service: Endoscopy;  Laterality: N/A;   FOOT SURGERY  06/22/11   left   KNEE ARTHROSCOPY  03/12/2005   right   KNEE ARTHROSCOPY     left   NASAL SINUS SURGERY     x 2   NEPHRECTOMY LIVING DONOR Left 04/2008   donated to spouse 2010([facility])   TRIGGER FINGER RELEASE  12/16/2011   Procedure: RELEASE TRIGGER FINGER/A-1 PULLEY;  Surgeon: Tennis Must, MD;  Location: [facility];  Service: Orthopedics;  Laterality: Right;  RIGHT LONG FINGER TRIGGER RELEASE & ANNULAR CYST EXCISION   TUMOR EXCISION  age 17   right arm    There were no vitals filed for this visit.   Subjective Assessment - 10/09/20 0936     Subjective Saw Dr. Dagoberto Ligas and then my medical doctor and they wanted to look at me with heart monitor, to make sure that I hadn't blacked out  in the previous fall.  Everything checked out okay, but they wanted me to hold on therapy during the time the monitor was on.  No other falls.  Got injections in bilat knees last week and that has made a big difference in my pain.    Limitations Walking;Standing    How long can you stand comfortably? < 5 minutes    How long can you walk comfortably? 10 minutes or less    Patient Stated Goals Pt's goals for therapy are to get rid of pain, and improve strength.    Currently in Pain? Yes    Pain Score 1     Pain Location Calf    Pain Orientation Right;Left    Pain Descriptors / Indicators Sore    Pain Type Chronic pain    Pain Onset More than a month ago    Pain Frequency Intermittent    Aggravating Factors  first waking up in the morning    Pain Relieving Factors having the injections in bilat knees last week;  using the rolling pin (to massage calves)    Pain Onset Yesterday   due to a fall                              [facility] Adult PT Treatment/Exercise - 10/09/20 0001       Transfers   Transfers Stand to Sit;Sit to Stand    Sit to Stand 5: Supervision;Without upper extremity assist;From chair/3-in-1;From bed    Five time sit to stand comments  21.09    Stand to Sit 5: Supervision;Without upper extremity assist;To chair/3-in-1      Ambulation/Gait   Ambulation/Gait Yes    Ambulation/Gait Assistance 5: Supervision    Ambulation Distance (Feet) 230 Feet    Assistive device None    Ambulation Surface Level;Indoor    Gait velocity 11.5 sec = 2.8 ft/sec      Standardized Balance Assessment   Standardized Balance Assessment Timed Up and Go Test;Mini-BESTest      Timed Up and Go Test   TUG Normal TUG;Cognitive TUG    Normal TUG (seconds) 12    Cognitive TUG (seconds) 15      Mini-BESTest   Sit To Stand Normal: Comes to stand without use of hands and stabilizes independently.    Rise to Toes Moderate: Heels up, but not full range (smaller than when holding hands), OR noticeable instability for 3 s.    Stand on one leg (left) Moderate: < 20 s   8 sec, 6.19   Stand on one leg (right) Moderate: < 20 s   6.4, 1.13 sec   Stand on one leg - lowest score 1    Compensatory Stepping Correction - Forward Normal: Recovers independently with a single, large step (second realignement is allowed).    Compensatory Stepping Correction - Backward No step, OR would fall if not caught, OR falls spontaneously.    Compensatory Stepping Correction - Left Lateral Normal: Recovers independently with 1 step (crossover or lateral OK)    Compensatory Stepping Correction - Right Lateral Moderate: Several steps to recover equilibrium    Stepping Corredtion Lateral - lowest score 1    Stance - Feet together, eyes open, firm surface  Normal: 30s    Stance - Feet together, eyes closed, foam surface   Moderate: < 30s    Incline - Eyes Closed Moderate: Stands independently < 30s OR aligns with surface  7.5 sec   Change in Gait Speed Moderate: Unable to change walking speed or signs of imbalance    Walk with head turns - Horizontal Moderate: performs head turns with reduction in gait speed.    Walk with pivot turns Normal: Turns with feet close FAST (< 3 steps) with good balance.    Step over obstacles Moderate: Steps over box but touches box OR displays cautious behavior by slowing gait.    Timed UP & GO with Dual Task Moderate: Dual Task affects either counting OR walking (>10%) when compared to the TUG without Dual Task.    Mini-BEST total score 17      High Level Balance   High Level Balance Comments Stagger stance forward/back rocking, with tactile cues for weightshifting to allow for push-off and for active dorsiflexion, 10-15 reps each leg position.      Self-Care   Self-Care Other Self-Care Comments    Other Self-Care Comments  Discussed pt's POC, in light of fact she has been out of therapy since 09/11/20 due to being on hold while wearing heart monitor.  Discussed continued therapy needs, with pain noted to have decreased since knee injections; however, pt is at risk of falls and demo decreased functional strenght.  She is in agreement to address this in therapy, renewing today.  She would also like to trial aquatic therapy sessions, as these were cancelled due to her being on hold.                    PT Education - 10/09/20 1455     Education Details PT POC-see self-care    Person(s) Educated Patient    Methods Explanation    Comprehension Verbalized understanding              PT Short Term Goals - 09/04/20 1630       PT SHORT TERM GOAL #1   Title Pt will be independent with HEP for decreased pain and improved functional strength, transfers, balance,  and gait.  TARGET 09/05/2020    Baseline 09/04/20: met with current program    Status Achieved      PT SHORT  TERM GOAL #2   Title Pt will improve 5x sit<>stand to less than or equal to 20 seconds for improved functional lower extremity strength.    Baseline 09/04/20: 16.62 sec's no UE support    Status Achieved      PT SHORT TERM GOAL #3   Title Pt will improve R hamstring ROM by at least 10 degrees for improved flexibility, decreased RLE pain.    Baseline 09/04/20: right LE - 20, left LE -14 ( improved from -26 on right , -22 on left) just not to goal    Status Partially Met      PT SHORT TERM GOAL #4   Title Pt will verbalize/demo at least 3 means to decrease pain in bilat calves and improve stability/decreased pain upon standing.    Baseline 09/04/20: met in session wtih cues    Status Partially Met               PT Long Term Goals - 10/09/20 1459       PT LONG TERM GOAL #1   Title Pt will be independent with progression of HEP for decreased pain, improved mobility, strength, balance.  TARGET for all LTGS: 10/03/2020    Baseline met for initial HEP, would benefit from progression of HEP    Time 8  Period Weeks    Status Achieved      PT LONG TERM GOAL #2   Title Pt will improve 5x sit<>stand to less than or equal to 15 seconds for improved functional strength.    Baseline 21.09 sec 10/09/2020    Time 8    Period Weeks    Status Not Met      PT LONG TERM GOAL #3   Title Pt will improve gait velocity to at least 2.62 ft/sec for improved gait efficiency and safety.    Baseline 2.16 ft/sec; 2.71 ft/sec 09/11/2020; 2.8 ft/sec 10/09/20    Time 8    Period Weeks    Status Achieved      PT LONG TERM GOAL #4   Title Pt will imrpove TUG score and TUG cog score to less than or equal to 10% difference for improved dual tasking with gait.    Baseline 16.9, 23.56; TUG 13 sec, TUG cogn 15 seconds 10/09/20    Time 8    Period Weeks    Status Not Met      PT LONG TERM GOAL #5   Title Pt will ambulate at least 15-20 minutes with appropriate standing rest breaks, without c/o increased pain  or fatigue for improved gait indpedence in her neighborhood.    Baseline 10 minutes before having to stop; 10/09/20:  reports 15 minutes of walking in neighborhood, " a little pain", but I can handle it.    Time 8    Period Weeks    Status Achieved             For Recert:   PT Short Term Goals - 10/09/20 1508       PT SHORT TERM GOAL #1   Title Pt will be independent with progression of HEP for decreased pain and improved functional strength, transfers, balance,  and gait.  TARGET 10/31/2020    Time 4    Period Weeks    Status Revised      PT SHORT TERM GOAL #2   Title Pt will improve 5x sit<>stand to less than or equal to 18 seconds for improved functional lower extremity strength.    Baseline 10/09/20:  >21 sec    Time 4    Period Weeks    Status Revised      PT SHORT TERM GOAL #3   Title Pt will improve posterior push and release test to 2 steps or less for improved balance recovery.    Time 4    Period Weeks    Status New      PT SHORT TERM GOAL #4   Baseline 09/04/20: met in session wtih cues             PT Long Term Goals - 10/09/20 1511       PT LONG TERM GOAL #1   Title Pt will verbalize plans for continued community fitness/HEP to maximize gains made in PT.  TARGET 11/28/2020    Time 8    Period Weeks    Status New      PT LONG TERM GOAL #2   Title Pt will improve 5x sit<>stand to less than or equal to 15 seconds for improved functional strength.    Baseline 21.09 sec 10/09/2020    Time 8    Period Weeks    Status On-going      PT LONG TERM GOAL #3   Title Pt will improve MiniBESTest to at least 20/28 for decreased fall risk.  Baseline 17/28 10/09/20    Time 8    Period Weeks    Status New      PT LONG TERM GOAL #4   Title Pt will imrpove TUG score and TUG cog score to less than or equal to 10% difference for improved dual tasking with gait.    Baseline 16.9, 23.56; TUG 13 sec, TUG cogn 15 seconds 10/09/20    Time 8    Period Weeks    Status  On-going      PT LONG TERM GOAL #5   Title Pt will ambulate at least 20 minutes with appropriate standing rest breaks, without c/o increased pain or fatigue for improved gait indpedence in her neighborhood.    Baseline 10 minutes before having to stop; 10/09/20:  reports 15 minutes of walking in neighborhood, " a little pain", but I can handle it.    Time 8    Period Weeks    Status Revised                  Plan - 10/09/20 1501     Clinical Impression Statement Recert completed today, as pt has been on hold from therapy per MD, due to wearing heart monitor.  Per pt report, no abnormal findings from heart monitor and she has been released back to participate in PT without restrictions.  During the time away, pt had bilateral knee injections and reports less pain in knees and in calves today at beginning of (and throughout) session.  Assessed LTGs, with pt meeting LTG 1, 3, 5.  LTG 2 and 4 not met, with slowed time for 5x sit<>stand, but improvement noted with TUG/TUG cognitive (just not to goal level).  She demonstrates decreased functional strength, abnormal posture, decreased flexibility, decreased balance per MiniBESTest.  She would benefit from skilled physical therapy to further address strenght, flexibility, posture, balance for improved overall gait and functional mobility.    Personal Factors and Comorbidities Comorbidity 3+    Comorbidities asthma, GERD, breast cancer, HTN, cevical spondylosis, lumbar radiculopathy, OA of bilat knees, hx of donating kidney to husband    Examination-Activity Limitations Locomotion Level;Transfers;Stand    Examination-Participation Restrictions [employer];Community Activity;Meal Prep    PT Frequency 2x / week    PT Duration 8 weeks   per recert, including 1/61/0960 week   PT Treatment/Interventions ADLs/Self Care Home Management;Aquatic Therapy;DME Instruction;Neuromuscular re-education;Balance training;Therapeutic exercise;Therapeutic  activities;Functional mobility training;Gait training;Patient/family education;Manual techniques;Passive range of motion    PT Next Visit Plan Continue gastroc and hamstring stretch with strengthening of hips/gluts/quads, ankle musculature, and work on standing static balance on compliant surface as well as dynamic balance (hip/ankle/step strategy) and gait activiteis.  Follow up with [facility] to get scheduled for aquatic therapy 1x/wk.    PT Home Exercise Plan Access Code: [email or web address]    Consulted and Agree with Plan of Care Patient             Patient will benefit from skilled therapeutic intervention in order to improve the following deficits and impairments:  Abnormal gait, Decreased range of motion, Pain, Decreased balance, Impaired flexibility, Decreased mobility, Decreased strength, Postural dysfunction  Visit Diagnosis: Abnormal posture  Muscle weakness (generalized)  Unsteadiness on feet  Other abnormalities of gait and mobility  Pain in right lower leg  Pain in left lower leg  Other symptoms and signs involving the nervous system     Problem List Patient Active Problem List   Diagnosis Date Noted   Primary osteoarthritis of right knee  05/12/2020   Primary osteoarthritis of left knee 05/12/2020   Hyperlipidemia    Bilateral calf pain 03/05/2019   Genetic testing 02/13/2019   Family history of ovarian cancer    Family history of bladder cancer    Family history of colon cancer    Family history of leukemia    Lumbar radiculopathy 12/08/2018   Myofascial pain 12/08/2018   Impaired gait and mobility 12/08/2018   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 10/09/2020, 3:08 PM Frazier Butt., PT   [facility] [address] [place], [place], [address] Phone: [phone]   Fax:  [phone]  Name: JUDA LAJEUNESSE MRN: [id number] Date of Birth: 25-Jan-1941

## 2020-10-10 ENCOUNTER — Other Ambulatory Visit: Payer: Self-pay | Admitting: Diagnostic Neuroimaging

## 2020-10-13 ENCOUNTER — Encounter: Payer: Self-pay | Admitting: Diagnostic Neuroimaging

## 2020-10-13 ENCOUNTER — Ambulatory Visit (INDEPENDENT_AMBULATORY_CARE_PROVIDER_SITE_OTHER): Payer: Medicare Other | Admitting: Diagnostic Neuroimaging

## 2020-10-13 VITALS — BP 116/78 | HR 95 | Ht 60.0 in | Wt 128.4 lb

## 2020-10-13 DIAGNOSIS — M4722 Other spondylosis with radiculopathy, cervical region: Secondary | ICD-10-CM

## 2020-10-13 DIAGNOSIS — M4807 Spinal stenosis, lumbosacral region: Secondary | ICD-10-CM | POA: Diagnosis not present

## 2020-10-13 DIAGNOSIS — G250 Essential tremor: Secondary | ICD-10-CM | POA: Diagnosis not present

## 2020-10-13 DIAGNOSIS — M4712 Other spondylosis with myelopathy, cervical region: Secondary | ICD-10-CM | POA: Diagnosis not present

## 2020-10-13 DIAGNOSIS — G20 Parkinson's disease: Secondary | ICD-10-CM

## 2020-10-13 MED ORDER — PRIMIDONE 50 MG PO TABS: 100.0000 mg | ORAL_TABLET | Freq: Three times a day (TID) | ORAL | 4 refills | Status: DC

## 2020-10-13 MED ORDER — CARBIDOPA-LEVODOPA 25-100 MG PO TABS: 2.0000 | ORAL_TABLET | Freq: Three times a day (TID) | ORAL | 4 refills | Status: DC

## 2020-10-13 NOTE — Patient Instructions (Addendum)
PARKINSONISM  - continue carb/levo 2 tabs three times a day - caution with walking and balance  - completed DBS evaluation at [facility] - reduce primidone to '100mg'$  three times a day - consider DBS as above

## 2020-10-13 NOTE — Progress Notes (Signed)
[facility]  PATIENT: April Church DOB: January 24, 1941  REFERRING CLINICIAN:  HISTORY FROM: patient REASON FOR VISIT: follow up    HISTORICAL  CHIEF COMPLAINT:  Chief Complaint  Patient presents with   Parkinson's disease    Rm 7, one year FU "going to pain management for back, had 2 injections; therapy for knees with 2 injections; one fall- saw PCP- wore heart monitor- was ok; will see cardiologist next"    HISTORY OF PRESENT ILLNESS:   UPDATE (10/13/20, VRP): Since last visit, tremor stable. Had 1 major fall 1 month ago; patient not sure about LOC. Getting heart workup. Increased primidone not helping. Seeing pain mgmt or back issues and leg pain issues.  UPDATE (10/10/19, VRP): Since last visit, tremor slightly progressing. Symptoms are moderate. Severity is moderate. No alleviating or aggravating factors. Tolerating medications.  Lumbar spine injections helping with low back pain.  UPDATE (01/09/19, VRP): Since last visit, doing well. Symptoms are stable. Severity is moderate. No alleviating or aggravating factors. Tolerating carb/levo 2 tabs three times a day. Planning for pain mgmt injections soon for low back pain.     UPDATE (01/31/18, VRP): Since last visit, doing about the same, except slightly more cramps and pain in arms and back. No alleviating or aggravating factors. Tolerating carb/levo.    UPDATE (05/23/17, VRP): Since last visit, doing well. Tolerating meds. No alleviating or aggravating factors. Tremor slightly worse. Some memory issues. Taking care of own ADLs. No major changes.  UPDATE 11/15/16: Since last visit, overall doing well. Tremor stable. Some memory loss issues continue, but not affecting ADLs. Tolerating meds. No falls. 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   UPDATE 05/10/16: Since last visit, stable. Some mild tremors in left hand. Mild balance issues. Mild memory issues. Cyclobenz helps with night time spasms.    UPDATE 11/12/15: Since last visit, tremors are stable. Balance stable. Now with night time neck, shoulder, spine and leg pain (spasm). Had PT and OT evals as well.   UPDATE 05/13/15: Since last visit, tremor in arms better. Had ST and botox for voice issues. Some wearing off of meds before mid-day dosing. No neck pain issues.   UPDATE 02/12/15: Since last visit, tremor is stable. More voice strangulation issues. Tolerating primidone '100mg'$  TID.  UPDATE 08/13/14: Since last visit, tried primidone '250mg'$  BID, but not much tremor benefit; also having more sleepiness in the day time. Still with hand > voice > chin tremor. No falls. Balance getting slightly worse.  UPDATE 05/22/14: Since last visit, tremor progressing. Now notes new resting tremor in the last year. Some balance and walking issues. She is s/p cervical decompression in Sept 2015, with good results.   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 Her tremor does not limit any of her activities of daily life. She denies any side effects of the Mysoline. She also reports today that she's had some numbness and tingling down her right arm from her neck and shoulder. It has been bothering her more in the last 6 months. 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 She saw Dr. Arnoldo Morale back in 2004.  She returns for reevaluation  UPDATE 06/07/12 (VRP): Doing well. BUE tremor is stable and controlled. Voice tremor is slightly progressing. Tolerating primidone.  PRIOR HPI (06/09/11, VRP):  80 year old female returns for F/U. Last seen 06/09/10.  She has been followed in this office for many years for essential tremor. She takes primidone 50 mg, 1.5 TID. She tolerates it well. She comes in today saying that her tremor is a little worse before lunch.  The tremor limits her a little bit with eating and writing but for the most part she is  happy with her level of function.She has hx of breast cancer.  No neurologic complaints.    REVIEW OF SYSTEMS: Full 14 system review of systems performed and negative except: as per HPI.    ALLERGIES: Allergies  Allergen Reactions   Shellfish Allergy Shortness Of Breath   Duloxetine Itching and Rash   Sulfa Drugs Cross Reactors Anxiety and Rash   Lisinopril     Other reaction(s): cough   Lyrica [Pregabalin] Other (See Comments)    "made me drunk, couldn't function"   Other     Other reaction(s): hyper   Sulfa Antibiotics     Other reaction(s): hyper, break out in a rash   Codeine Anxiety    HOME MEDICATIONS: Outpatient Medications Prior to Visit  Medication Sig Dispense Refill   albuterol (VENTOLIN HFA) 108 (90 Base) MCG/ACT inhaler Inhale 1 puff into the lungs every 6 (six) hours as needed for wheezing or shortness of breath. 18 g 1   amLODipine (NORVASC) 10 MG tablet Take 10 mg by mouth daily.     atorvastatin (LIPITOR) 20 MG tablet Take 20 mg by mouth every other day.      azelastine (ASTELIN) 0.1 % nasal spray Place 1-2 sprays into both nostrils 2 (two) times daily. Use in each nostril as directed 30 mL 5   CALCIUM CARBONATE PO Take 1 tablet by mouth 2 (two) times daily.     Cholecalciferol (VITAMIN D) 1000 UNITS capsule Take 1,000 Units by mouth 2 (two) times daily.     Coenzyme Q10 (CO Q 10) 100 MG CAPS Take by mouth.     cyclobenzaprine (FLEXERIL) 5 MG tablet TAKE 1 TABLET BY MOUTH EVERY DAY AT BEDTIME AS NEEDED FOR  MUSCLE  SPASMS,  YOU  CAN  TAKE  UP  TO  2  TABLETS/'10MG'$   MAX  IF  NEEDED. 50 tablet 11   EPINEPHrine 0.3 mg/0.3 mL IJ SOAJ injection Inject 0.3 mLs (0.3 mg total) into the muscle as needed. 2 each 1   famotidine (PEPCID) 20 MG tablet Take 1 tablet (20 mg total) by mouth daily. 30 tablet 5   fluticasone (FLOVENT HFA) 110 MCG/ACT inhaler 1 puff     hydrOXYzine (ATARAX/VISTARIL) 10 MG tablet Take 10 mg by mouth at bedtime.     ipratropium (ATROVENT) 0.03 %  nasal spray Place 2 sprays into both nostrils 2 (two) times daily. 30 mL 5   ketotifen (ZADITOR) 0.025 % ophthalmic solution      levocetirizine (XYZAL) 5 MG tablet Take 1 tablet (5 mg total) by mouth every evening. 30 tablet 5   Polyethyl Glycol-Propyl Glycol (SYSTANE OP) Place 1 drop into both eyes 2 (two) times daily.     SYMBICORT 160-4.5 MCG/ACT inhaler Inhale 2 puffs into the lungs 2 (two) times daily. 10.2 g 5   traMADol (ULTRAM) 50 MG tablet Take 1 tablet (50 mg total) by mouth every 12 (twelve) hours as needed. 30 tablet 1   triamcinolone cream (KENALOG) 0.1 % Apply 1 application topically 2 (two) times daily as needed (FOR RASH).     venlafaxine (EFFEXOR) 75 MG tablet Take 1 tablet (75 mg total) by mouth  daily. 90 tablet 1   carbidopa-levodopa (SINEMET IR) 25-100 MG tablet Take 3 tablets in am and midday, and 2 tablets in the evening     primidone (MYSOLINE) 50 MG tablet Take 2 tablets (100 mg total) by mouth 3 (three) times daily. (Patient taking differently: Takes 3 in the am, and midday, and 2 in the evening) 540 tablet 4   No facility-administered medications prior to visit.    PAST MEDICAL HISTORY: Past Medical History:  Diagnosis Date   Asthma    daily inhaler   Breast cancer ([place])    left- radiation and surgery -dx. 2011- no further tx. now- [name] , [name]   Cyst of finger 11/2011   annular cyst right long finger   Dental crowns present    Dermatitis    Family history of bladder cancer    Family history of colon cancer    Family history of leukemia    Family history of ovarian cancer    Frequency of urination    GERD (gastroesophageal reflux disease)    Hemorrhoid    Hyperlipidemia    Hypertension    under control, has been on med. x 2 yrs.   Parkinson's disease ([place])    PONV (postoperative nausea and vomiting)    Seasonal allergies    Tremors of nervous system    hands   Trigger finger of right hand 11/2011   long finger    PAST SURGICAL  HISTORY: Past Surgical History:  Procedure Laterality Date   ANTERIOR CERVICAL DECOMPRESSION/DISCECTOMY FUSION 4 LEVELS N/A 11/14/2013   Procedure: ANTERIOR CERVICAL DECOMPRESSION/DISCECTOMY FUSION 4 LEVELS;  Surgeon: Newman Pies, MD;  Location: [facility];  Service: Neurosurgery;  Laterality: N/A;  C34 C45 C56 C67 anterior cervical fusion with interbody prosthesis plating and  bonegraft   APPENDECTOMY  age 63   BREAST LUMPECTOMY  06/16/2009   left; SLN bx.   BREAST LUMPECTOMY  07/01/2009   re-excision   BREAST SURGERY  1999   reduction   CATARACT EXTRACTION, BILATERAL     COLONOSCOPY WITH PROPOFOL N/A 12/03/2014   Procedure: COLONOSCOPY WITH PROPOFOL;  Surgeon: [name], MD;  Location: [facility];  Service: Endoscopy;  Laterality: N/A;   FOOT SURGERY  06/22/11   left   KNEE ARTHROSCOPY  03/12/2005   right   KNEE ARTHROSCOPY     left   NASAL SINUS SURGERY     x 2   NEPHRECTOMY LIVING DONOR Left 04/2008   donated to spouse 2010([facility])   TRIGGER FINGER RELEASE  12/16/2011   Procedure: RELEASE TRIGGER FINGER/A-1 PULLEY;  Surgeon: Tennis Must, MD;  Location: [facility];  Service: Orthopedics;  Laterality: Right;  RIGHT LONG FINGER TRIGGER RELEASE & ANNULAR CYST EXCISION   TUMOR EXCISION  age 72   right arm    FAMILY HISTORY: Family History  Problem Relation Age of Onset   Kidney disease Mother    Stroke Father    Stroke Sister        08/2015   Diabetes Brother    Heart disease Brother    Ovarian cancer Sister 59   Heart disease Paternal Grandmother    Heart disease Paternal Grandfather    Leukemia Other 32       brother's grandson   Bladder Cancer Brother 39   Colon cancer Niece        dx in late 22s    SOCIAL HISTORY:  Social History   Socioeconomic History  Marital status: Widowed    Spouse name: Marcello Moores    Number of children: 1   Years of education: HS   Highest education level: Not on file  Occupational History   Occupation:  Retired  Tobacco Use   Smoking status: Never   Smokeless tobacco: Never  Substance and Sexual Activity   Alcohol use: No    Alcohol/week: 0.0 standard drinks   Drug use: No   Sexual activity: Yes  Other Topics Concern   Not on file  Social History Narrative   10/13/20 lives alone, husband passed 04/2020    Patient has 1 child. Patient is retired. Patient has a HS education. Patient is right handed. Patient drinks caffeine occasionally.     Social Determinants of Health   Financial Resource Strain: Not on file  Food Insecurity: Not on file  Transportation Needs: Not on file  Physical Activity: Not on file  Stress: Not on file  Social Connections: Not on file  Intimate Partner Violence: Not on file    PHYSICAL EXAM  Vitals:   10/13/20 1307  BP: 116/78  Pulse: 95  Weight: 128 lb 6.4 oz (58.2 kg)  Height: 5' (1.524 m)   Body mass index is 25.08 kg/m.  GENERAL EXAM: Patient is in no distress  CARDIOVASCULAR: Regular rate and rhythm, no murmurs, no carotid bruits  NEUROLOGIC: MENTAL STATUS: awake, alert, language fluent, comprehension intact, naming intact; MASKED FACIES; NEG FRONTAL RELEASE SIGNS MMSE - Mini Mental State Exam 05/23/2017  Orientation to time 5  Orientation to Place 5  Registration 3  Attention/ Calculation 1  Recall 2  Language- name 2 objects 2  Language- repeat 1  Language- follow 3 step command 3  Language- read & follow direction 1  Write a sentence 1  Copy design 1  Total score 25    CRANIAL NERVE: pupils equal and reactive to light, visual fields full to confrontation, extraocular muscles intact, no nystagmus, facial sensation and strength symmetric, uvula midline, shoulder shrug symmetric, tongue midline; SOFT HOARSE VOICE MOTOR: normal bulk, POSTURAL AND ACTION TREMOR; RARE REST TREMOR IN RUE > LUE; INT MOUTH TREMOR; BRADYKINESIA IN BUE AND BLE; LEFT SLOWER THAN RIGHT; MILD COGWHEELING IN RUE > LUE; full strength in the BUE, BLE; VOICE  TREMOR SENSORY: normal and symmetric to light touch COORDINATION: finger-nose-finger, fine finger movements normal REFLEXES: deep tendon reflexes present and symmetric GAIT/STATION: narrow based gait; SLOW AND CAUTIOUS, DECR RIGHT ARM SWING    DIAGNOSTIC DATA (LABS, IMAGING, TESTING) - I reviewed patient records, labs, notes, testing and imaging myself where available.  Lab Results  Component Value Date   WBC 5.2 01/20/2015   HGB 12.0 01/20/2015   HCT 36.1 01/20/2015   MCV 88.4 01/20/2015   PLT 293 01/20/2015      Component Value Date/Time   NA 142 07/30/2020 1141   NA 136 01/20/2015 1326   K 4.5 07/30/2020 1141   K 4.1 01/20/2015 1326   CL 100 07/30/2020 1141   CL 102 06/30/2012 1231   CO2 25 07/30/2020 1141   CO2 27 01/20/2015 1326   GLUCOSE 89 07/30/2020 1141   GLUCOSE 66 (L) 01/20/2015 1326   GLUCOSE 77 06/30/2012 1231   BUN 11 07/30/2020 1141   BUN 13.9 01/20/2015 1326   CREATININE 0.65 07/30/2020 1141   CREATININE 0.9 01/20/2015 1326   CALCIUM 9.1 07/30/2020 1141   CALCIUM 8.8 01/20/2015 1326   PROT 7.4 01/20/2015 1326   ALBUMIN 3.6 01/20/2015 1326   AST  17 01/20/2015 1326   ALT 13 01/20/2015 1326   ALKPHOS 86 01/20/2015 1326   BILITOT <0.30 01/20/2015 1326   GFRNONAA 83 (L) 11/06/2013 1032   GFRAA >90 11/06/2013 1032   No results found for: CHOL No results found for: HGBA1C No results found for: VITAMINB12 No results found for: TSH   MRI brain - unremarkable per patient (done ~ early 2000's)  06/25/13 EMG/NCS 1. 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 2. No evidence of widespread underlying large fiber neuropathy.  11/14/13 CXR [I reviewed images myself and agree with interpretation. -VRP]  - Anterior cervical disc fusion localization images as described.  10/11/18 MRI lumbar spine  MRI lumbar spine (without) demonstrating: - At L4-5:  disc bulging and facet hypertrophy with severe right and moderate left foraminal stenosis. - At L3-4: disc bulging and facet hypertrophy with moderate right and mild left foraminal stenosis. - At L5-S1: disc bulging and facet hypertrophy with mild right and moderate left foraminal stenosis.    ASSESSMENT AND PLAN  80 y.o. year old female  has a past medical history of Asthma, Breast cancer ([place]), Cyst of finger (11/2011), Dental crowns present, Dermatitis, Family history of bladder cancer, Family history of colon cancer, Family history of leukemia, Family history of ovarian cancer, Frequency of urination, GERD (gastroesophageal reflux disease), Hemorrhoid, Hyperlipidemia, Hypertension, Parkinson's disease ([place]), PONV (postoperative nausea and vomiting), Seasonal allergies, Tremors of nervous system, and Trigger finger of right hand (11/2011). here with essential tremor since 1990's. Now with intermittent resting tremor AND bradykinesia since 2016. May represent essential tremor with superimposed parkinson's disease + cervical myelopathy sequelae. DATscan ordered but denied (to confirm superimposed parkinsonism on top of essential tremor and cervical myelopathy).  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    Dispense:  540 tablet    Refill:  4   primidone (MYSOLINE) 50 MG tablet    Sig: Take 2 tablets (100 mg total) by mouth 3 (three) times daily.    Dispense:  540 tablet    Refill:  4   Return in about 1 year (around 10/13/2021).     Penni Bombard, MD [id number], [id number] PM Certified in Neurology, Neurophysiology and Neuroimaging  [facility] [address] [place], [place] [address] [phone]

## 2020-10-16 ENCOUNTER — Ambulatory Visit: Payer: Medicare Other | Admitting: Physical Therapy

## 2020-10-17 ENCOUNTER — Encounter: Payer: Self-pay | Admitting: Physical Therapy

## 2020-10-17 ENCOUNTER — Other Ambulatory Visit: Payer: Self-pay

## 2020-10-17 ENCOUNTER — Ambulatory Visit: Payer: Medicare Other | Admitting: Physical Therapy

## 2020-10-17 DIAGNOSIS — R2681 Unsteadiness on feet: Secondary | ICD-10-CM

## 2020-10-17 DIAGNOSIS — R2689 Other abnormalities of gait and mobility: Secondary | ICD-10-CM | POA: Diagnosis not present

## 2020-10-17 DIAGNOSIS — M79661 Pain in right lower leg: Secondary | ICD-10-CM | POA: Diagnosis not present

## 2020-10-17 DIAGNOSIS — M6281 Muscle weakness (generalized): Secondary | ICD-10-CM | POA: Diagnosis not present

## 2020-10-17 DIAGNOSIS — R293 Abnormal posture: Secondary | ICD-10-CM | POA: Diagnosis not present

## 2020-10-17 DIAGNOSIS — M79662 Pain in left lower leg: Secondary | ICD-10-CM | POA: Diagnosis not present

## 2020-10-17 NOTE — Therapy (Signed)
[facility] [address] [place], [place], [address] Phone: [phone]   Fax:  [phone]  Physical Therapy Treatment  Patient Details  Name: April Church MRN: [id number] Date of Birth: May 03, 1940 Referring Provider (PT): Dr. Courtney Heys   Encounter Date: 10/17/2020   PT End of Session - 10/17/20 2240     Visit Number 12    Number of Visits 26    Date for PT Re-Evaluation 11/28/20    Authorization Type Medicare    Progress Note Due on Visit 20    PT Start Time 1237    PT Stop Time 8299    PT Time Calculation (min) 40 min    Activity Tolerance Patient tolerated treatment well;No increased pain    Behavior During Therapy WFL for tasks assessed/performed             Past Medical History:  Diagnosis Date   Asthma    daily inhaler   Breast cancer ([place])    left- radiation and surgery -dx. 2011- no further tx. now- [name] , [name]   Cyst of finger 11/2011   annular cyst right long finger   Dental crowns present    Dermatitis    Family history of bladder cancer    Family history of colon cancer    Family history of leukemia    Family history of ovarian cancer    Frequency of urination    GERD (gastroesophageal reflux disease)    Hemorrhoid    Hyperlipidemia    Hypertension    under control, has been on med. x 2 yrs.   Parkinson's disease ([place])    PONV (postoperative nausea and vomiting)    Seasonal allergies    Tremors of nervous system    hands   Trigger finger of right hand 11/2011   long finger    Past Surgical History:  Procedure Laterality Date   ANTERIOR CERVICAL DECOMPRESSION/DISCECTOMY FUSION 4 LEVELS N/A 11/14/2013   Procedure: ANTERIOR CERVICAL DECOMPRESSION/DISCECTOMY FUSION 4 LEVELS;  Surgeon: Newman Pies, MD;  Location: [facility];  Service: Neurosurgery;  Laterality: N/A;  C34 C45 C56 C67 anterior cervical fusion with interbody prosthesis plating and  bonegraft    APPENDECTOMY  age 56   BREAST LUMPECTOMY  06/16/2009   left; SLN bx.   BREAST LUMPECTOMY  07/01/2009   re-excision   BREAST SURGERY  1999   reduction   CATARACT EXTRACTION, BILATERAL     COLONOSCOPY WITH PROPOFOL N/A 12/03/2014   Procedure: COLONOSCOPY WITH PROPOFOL;  Surgeon: [name], MD;  Location: [facility];  Service: Endoscopy;  Laterality: N/A;   FOOT SURGERY  06/22/11   left   KNEE ARTHROSCOPY  03/12/2005   right   KNEE ARTHROSCOPY     left   NASAL SINUS SURGERY     x 2   NEPHRECTOMY LIVING DONOR Left 04/2008   donated to spouse 2010([facility])   TRIGGER FINGER RELEASE  12/16/2011   Procedure: RELEASE TRIGGER FINGER/A-1 PULLEY;  Surgeon: Tennis Must, MD;  Location: [facility];  Service: Orthopedics;  Laterality: Right;  RIGHT LONG FINGER TRIGGER RELEASE & ANNULAR CYST EXCISION   TUMOR EXCISION  age 35   right arm    There were no vitals filed for this visit.   Subjective Assessment - 10/17/20 1239     Subjective No changes, everything going okay.  Dr. Leta Baptist backed me down on the Sinemet to two pills per day. (MD note  says keep at 2 tables 3 times per day).    Limitations Walking;Standing    How long can you stand comfortably? < 5 minutes    How long can you walk comfortably? 10 minutes or less    Patient Stated Goals Pt's goals for therapy are to get rid of pain, and improve strength.    Currently in Pain? Yes    Pain Score 1     Pain Location Calf    Pain Orientation Right;Left    Pain Descriptors / Indicators Sore    Pain Type Chronic pain    Pain Onset More than a month ago    Pain Frequency Intermittent    Aggravating Factors  first waking up in the morning    Pain Relieving Factors having the injections    Pain Onset Yesterday   due to a fall                              [facility] Adult PT Treatment/Exercise - 10/17/20 0001       Ambulation/Gait   Ambulation/Gait Yes    Ambulation/Gait Assistance 5: Supervision     Ambulation/Gait Assistance Details Cues for increased step length, heelstrike    Ambulation Distance (Feet) 80 Feet   x 2; 100 ft x 2   Assistive device None    Gait Pattern Step-through pattern;Decreased step length - right;Decreased step length - left;Decreased dorsiflexion - right;Decreased dorsiflexion - left;Right flexed knee in stance;Left flexed knee in stance;Decreased trunk rotation;Narrow base of support      Self-Care   Self-Care Other Self-Care Comments    Other Self-Care Comments  Followed up about this PT's conversation with aquatic therapist, with Vinnie Level planning to contact pt for scheduling in next week or so.  Pt has seen Dr. Leta Baptist and he changed dose of medication as well as possibility for appt at [facility] for deep brain stimulation recommendation.  Answered pt's questions regarding this.      Knee/Hip Exercises: Stretches   Press photographer Right;Left;3 reps;20 seconds    Gastroc Stretch Limitations foot propped on 4" shelf, cues for technique    Other Knee/Hip Stretches runner's stretch, 2 reps x 20 sec, each leg, at counter.      Knee/Hip Exercises: Aerobic   Nustep NuStep, for warm up, Level 3, 4 extremities, x 5 minutes for flexibility, strengthening of lower extremities; pt keeps steps/min > 45-50                 Balance Exercises - 10/17/20 0001       Balance Exercises: Standing   Stepping Strategy Anterior;UE support;10 reps;Limitations;Posterior    Stepping Strategy Limitations Forward Stepping over flat black obstacle, 10 reps with tactile, verbal cue for RLE knee extension, hip extension for improved weightshift.  Progressed to step over obstacle, then tap oppostie leg to 6" step, 6 reps each side.    Step Ups Forward;UE support 1;Limitations    Step Ups Limitations 10 reps at 4" step    Retro Gait 5 reps;Limitations    Retro Gait Limitations Forward/back along counter, cues for increased step length, heelstrike, foot clearance.                  PT Short Term Goals - 10/09/20 1508       PT SHORT TERM GOAL #1   Title Pt will be independent with progression of HEP for decreased pain and improved functional strength, transfers,  balance,  and gait.  TARGET 10/31/2020    Time 4    Period Weeks    Status Revised      PT SHORT TERM GOAL #2   Title Pt will improve 5x sit<>stand to less than or equal to 18 seconds for improved functional lower extremity strength.    Baseline 10/09/20:  >21 sec    Time 4    Period Weeks    Status Revised      PT SHORT TERM GOAL #3   Title Pt will improve posterior push and release test to 2 steps or less for improved balance recovery.    Time 4    Period Weeks    Status New      PT SHORT TERM GOAL #4   Baseline 09/04/20: met in session wtih cues               PT Long Term Goals - 10/09/20 1511       PT LONG TERM GOAL #1   Title Pt will verbalize plans for continued community fitness/HEP to maximize gains made in PT.  TARGET 11/28/2020    Time 8    Period Weeks    Status New      PT LONG TERM GOAL #2   Title Pt will improve 5x sit<>stand to less than or equal to 15 seconds for improved functional strength.    Baseline 21.09 sec 10/09/2020    Time 8    Period Weeks    Status On-going      PT LONG TERM GOAL #3   Title Pt will improve MiniBESTest to at least 20/28 for decreased fall risk.    Baseline 17/28 10/09/20    Time 8    Period Weeks    Status New      PT LONG TERM GOAL #4   Title Pt will imrpove TUG score and TUG cog score to less than or equal to 10% difference for improved dual tasking with gait.    Baseline 16.9, 23.56; TUG 13 sec, TUG cogn 15 seconds 10/09/20    Time 8    Period Weeks    Status On-going      PT LONG TERM GOAL #5   Title Pt will ambulate at least 20 minutes with appropriate standing rest breaks, without c/o increased pain or fatigue for improved gait indpedence in her neighborhood.    Baseline 10 minutes before having to stop; 10/09/20:   reports 15 minutes of walking in neighborhood, " a little pain", but I can handle it.    Time 8    Period Weeks    Status Revised                   Plan - 10/17/20 2241     Clinical Impression Statement Focused on flexibility, strengthening and balance activities.  Pt continues to need cues for technique for various stretches and standing strengthening for quads to assist with terminal knee extension.  She will benefit from continued skilled PT to further address flexibility, balance, strength to assist with functional mobility.    Personal Factors and Comorbidities Comorbidity 3+    Comorbidities asthma, GERD, breast cancer, HTN, cevical spondylosis, lumbar radiculopathy, OA of bilat knees, hx of donating kidney to husband    Examination-Activity Limitations Locomotion Level;Transfers;Stand    Examination-Participation Restrictions Church;Community Activity;Meal Prep    PT Frequency 2x / week    PT Duration 8 weeks   per recert, including 3/00/9233 week  PT Treatment/Interventions ADLs/Self Care Home Management;Aquatic Therapy;DME Instruction;Neuromuscular re-education;Balance training;Therapeutic exercise;Therapeutic activities;Functional mobility training;Gait training;Patient/family education;Manual techniques;Passive range of motion    PT Next Visit Plan Continue gastroc and hamstring stretch with strengthening of hips/gluts/quads, ankle musculature, and work on standing static balance on compliant surface as well as dynamic balance (hip/ankle/step strategy) and gait activiteis.  Step strategy varied directions; Follow up with Vinnie Level to get scheduled for aquatic therapy 1x/wk.  Consider adding to HEP for balance    PT Home Exercise Plan Access Code: [email or web address]    Consulted and Agree with Plan of Care Patient             Patient will benefit from skilled therapeutic intervention in order to improve the following deficits and impairments:  Abnormal gait, Decreased range of  motion, Pain, Decreased balance, Impaired flexibility, Decreased mobility, Decreased strength, Postural dysfunction  Visit Diagnosis: Abnormal posture  Muscle weakness (generalized)  Unsteadiness on feet  Other abnormalities of gait and mobility     Problem List Patient Active Problem List   Diagnosis Date Noted   Primary osteoarthritis of right knee 05/12/2020   Primary osteoarthritis of left knee 05/12/2020   Hyperlipidemia    Bilateral calf pain 03/05/2019   Genetic testing 02/13/2019   Family history of ovarian cancer    Family history of bladder cancer    Family history of colon cancer    Family history of leukemia    Lumbar radiculopathy 12/08/2018   Myofascial pain 12/08/2018   Impaired gait and mobility 12/08/2018   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   Chronic nonallergic rhinitis 12/29/2016   Mild persistent asthma, uncomplicated 01/77/9390   Vocal fold paralysis, 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   Gastroesophageal reflux disease 12/29/2016   Cervical spondylosis with myelopathy and radiculopathy 11/14/2013   Essential tremor 06/13/2013   Cervical spondylosis without myelopathy 06/13/2013   Breast cancer of lower-outer quadrant of left female breast ([place]) 09/10/2010    Lucah Petta W. 10/17/2020, 10:47 PM Frazier Butt., PT   [facility] [address] [place], [place], [address] Phone: [phone]   Fax:  [phone]  Name: April Church MRN: [id number] Date of Birth: [birth year]

## 2020-10-22 ENCOUNTER — Ambulatory Visit: Payer: Medicare Other | Admitting: Physical Therapy

## 2020-10-22 ENCOUNTER — Other Ambulatory Visit: Payer: Self-pay

## 2020-10-22 ENCOUNTER — Encounter: Payer: Self-pay | Admitting: Physical Therapy

## 2020-10-22 DIAGNOSIS — R2689 Other abnormalities of gait and mobility: Secondary | ICD-10-CM

## 2020-10-22 DIAGNOSIS — M6281 Muscle weakness (generalized): Secondary | ICD-10-CM | POA: Diagnosis not present

## 2020-10-22 DIAGNOSIS — R2681 Unsteadiness on feet: Secondary | ICD-10-CM | POA: Diagnosis not present

## 2020-10-22 DIAGNOSIS — M79662 Pain in left lower leg: Secondary | ICD-10-CM | POA: Diagnosis not present

## 2020-10-22 DIAGNOSIS — M79661 Pain in right lower leg: Secondary | ICD-10-CM | POA: Diagnosis not present

## 2020-10-22 DIAGNOSIS — R293 Abnormal posture: Secondary | ICD-10-CM | POA: Diagnosis not present

## 2020-10-22 NOTE — Therapy (Signed)
[facility] [address] [place], [place], [address] Phone: [phone]   Fax:  [phone]  Physical Therapy Treatment  Patient Details  Name: April Church MRN: [id number] Date of Birth: 1941/01/30 Referring Provider (PT): Dr. Courtney Heys   Encounter Date: 10/22/2020   PT End of Session - 10/22/20 0937     Visit Number 13    Number of Visits 26    Date for PT Re-Evaluation 11/28/20    Authorization Type Medicare    Progress Note Due on Visit 20    PT Start Time 0933    PT Stop Time 1015    PT Time Calculation (min) 42 min    Activity Tolerance Patient tolerated treatment well;No increased pain    Behavior During Therapy WFL for tasks assessed/performed             Past Medical History:  Diagnosis Date   Asthma    daily inhaler   Breast cancer ([place])    left- radiation and surgery -dx. 2011- no further tx. now- [name] , [name]   Cyst of finger 11/2011   annular cyst right long finger   Dental crowns present    Dermatitis    Family history of bladder cancer    Family history of colon cancer    Family history of leukemia    Family history of ovarian cancer    Frequency of urination    GERD (gastroesophageal reflux disease)    Hemorrhoid    Hyperlipidemia    Hypertension    under control, has been on med. x 2 yrs.   Parkinson's disease ([place])    PONV (postoperative nausea and vomiting)    Seasonal allergies    Tremors of nervous system    hands   Trigger finger of right hand 11/2011   long finger    Past Surgical History:  Procedure Laterality Date   ANTERIOR CERVICAL DECOMPRESSION/DISCECTOMY FUSION 4 LEVELS N/A 11/14/2013   Procedure: ANTERIOR CERVICAL DECOMPRESSION/DISCECTOMY FUSION 4 LEVELS;  Surgeon: Newman Pies, MD;  Location: [facility];  Service: Neurosurgery;  Laterality: N/A;  C34 C45 C56 C67 anterior cervical fusion with interbody prosthesis plating and  bonegraft    APPENDECTOMY  age 53   BREAST LUMPECTOMY  06/16/2009   left; SLN bx.   BREAST LUMPECTOMY  07/01/2009   re-excision   BREAST SURGERY  1999   reduction   CATARACT EXTRACTION, BILATERAL     COLONOSCOPY WITH PROPOFOL N/A 12/03/2014   Procedure: COLONOSCOPY WITH PROPOFOL;  Surgeon: [name], MD;  Location: [facility];  Service: Endoscopy;  Laterality: N/A;   FOOT SURGERY  06/22/11   left   KNEE ARTHROSCOPY  03/12/2005   right   KNEE ARTHROSCOPY     left   NASAL SINUS SURGERY     x 2   NEPHRECTOMY LIVING DONOR Left 04/2008   donated to spouse 2010([facility])   TRIGGER FINGER RELEASE  12/16/2011   Procedure: RELEASE TRIGGER FINGER/A-1 PULLEY;  Surgeon: Tennis Must, MD;  Location: [facility];  Service: Orthopedics;  Laterality: Right;  RIGHT LONG FINGER TRIGGER RELEASE & ANNULAR CYST EXCISION   TUMOR EXCISION  age 61   right arm    There were no vitals filed for this visit.   Subjective Assessment - 10/22/20 0934     Subjective No new complaitns. Yesterday was a bad day with increased bil LE pain, better today. Did not do her walking yesterday  due to the pain. No falls.    Limitations Walking;Standing    How long can you stand comfortably? < 5 minutes    How long can you walk comfortably? 10 minutes or less    Patient Stated Goals Pt's goals for therapy are to get rid of pain, and improve strength.    Currently in Pain? Yes    Pain Score 1     Pain Location Calf    Pain Orientation Right;Left    Pain Descriptors / Indicators Sore    Pain Type Chronic pain    Pain Onset More than a month ago    Pain Frequency Intermittent    Aggravating Factors  first waking up in the morining    Pain Relieving Factors getting up and moving around                   [facility] Adult PT Treatment/Exercise - 10/22/20 0938       Transfers   Transfers Stand to Sit;Sit to Stand    Sit to Stand 5: Supervision;Without upper extremity assist;From chair/3-in-1;From bed     Stand to Sit 5: Supervision;Without upper extremity assist;To chair/3-in-1      Ambulation/Gait   Ambulation/Gait Yes    Ambulation/Gait Assistance 5: Supervision    Ambulation/Gait Assistance Details cues for heel>toe step progression    Ambulation Distance (Feet) --   around clinic with session   Assistive device None    Gait Pattern Step-through pattern;Decreased step length - right;Decreased step length - left;Decreased dorsiflexion - right;Decreased dorsiflexion - left;Right flexed knee in stance;Left flexed knee in stance;Decreased trunk rotation;Narrow base of support    Ambulation Surface Level;Indoor      Knee/Hip Exercises: Aerobic   Nustep Level 4, 4 extremities, x 8 minutes for flexibility, strengthening of lower extremities; pt keeps steps/min > 45-50      Knee/Hip Exercises: Standing   Forward Step Up Both;1 set;10 reps;Hand Hold: 2;Step Height: 6";Limitations    Forward Step Up Limitations alternating forward step ups with contralateral march, bil support on bars. min guard assist for safety with cues on technique/form.                 Balance Exercises - 10/22/20 0957       Balance Exercises: Standing   Rockerboard Anterior/posterior;Lateral;EO;EC;30 seconds;Other reps (comment);Limitations    Rockerboard Limitations performed both ways on balance board with no UE support, min guard to min assist for balance: rocking the board with emphasis on tall posture with EO, progressing to EC with min guard to min assist, increased with EC. Cues on posture and weight shifting to assist with balance on board; then holding the board steady for EC 30 sec's x 3 reps. min guard to min assist for balance with cues on posture and weight shifting for balance assistance                 PT Short Term Goals - 10/09/20 1508       PT SHORT TERM GOAL #1   Title Pt will be independent with progression of HEP for decreased pain and improved functional strength, transfers,  balance,  and gait.  TARGET 10/31/2020    Time 4    Period Weeks    Status Revised      PT SHORT TERM GOAL #2   Title Pt will improve 5x sit<>stand to less than or equal to 18 seconds for improved functional lower extremity strength.    Baseline 10/09/20:  >21  sec    Time 4    Period Weeks    Status Revised      PT SHORT TERM GOAL #3   Title Pt will improve posterior push and release test to 2 steps or less for improved balance recovery.    Time 4    Period Weeks    Status New      PT SHORT TERM GOAL #4   Baseline 09/04/20: met in session wtih cues               PT Long Term Goals - 10/09/20 1511       PT LONG TERM GOAL #1   Title Pt will verbalize plans for continued community fitness/HEP to maximize gains made in PT.  TARGET 11/28/2020    Time 8    Period Weeks    Status New      PT LONG TERM GOAL #2   Title Pt will improve 5x sit<>stand to less than or equal to 15 seconds for improved functional strength.    Baseline 21.09 sec 10/09/2020    Time 8    Period Weeks    Status On-going      PT LONG TERM GOAL #3   Title Pt will improve MiniBESTest to at least 20/28 for decreased fall risk.    Baseline 17/28 10/09/20    Time 8    Period Weeks    Status New      PT LONG TERM GOAL #4   Title Pt will imrpove TUG score and TUG cog score to less than or equal to 10% difference for improved dual tasking with gait.    Baseline 16.9, 23.56; TUG 13 sec, TUG cogn 15 seconds 10/09/20    Time 8    Period Weeks    Status On-going      PT LONG TERM GOAL #5   Title Pt will ambulate at least 20 minutes with appropriate standing rest breaks, without c/o increased pain or fatigue for improved gait indpedence in her neighborhood.    Baseline 10 minutes before having to stop; 10/09/20:  reports 15 minutes of walking in neighborhood, " a little pain", but I can handle it.    Time 8    Period Weeks    Status Revised                   Plan - 10/22/20 7939     Clinical  Impression Statement Today's skilled session continued to focus on strengthening and balance training with no issues noted or reported. The pt is making steady progress and should benefit from continued PT to progress toward unmet goals.    Personal Factors and Comorbidities Comorbidity 3+    Comorbidities asthma, GERD, breast cancer, HTN, cevical spondylosis, lumbar radiculopathy, OA of bilat knees, hx of donating kidney to husband    Examination-Activity Limitations Locomotion Level;Transfers;Stand    Examination-Participation Restrictions Church;Community Activity;Meal Prep    PT Frequency 2x / week    PT Duration 8 weeks   per recert, including 0/30/0923 week   PT Treatment/Interventions ADLs/Self Care Home Management;Aquatic Therapy;DME Instruction;Neuromuscular re-education;Balance training;Therapeutic exercise;Therapeutic activities;Functional mobility training;Gait training;Patient/family education;Manual techniques;Passive range of motion    PT Next Visit Plan Continue gastroc and hamstring stretch with strengthening of hips/gluts/quads, ankle musculature, and work on standing static balance on compliant surface as well as dynamic balance (hip/ankle/step strategy) and gait activiteis.  Step strategy varied directions;    PT Home Exercise Plan Access Code: [email or web address]  Consulted and Agree with Plan of Care Patient             Patient will benefit from skilled therapeutic intervention in order to improve the following deficits and impairments:  Abnormal gait, Decreased range of motion, Pain, Decreased balance, Impaired flexibility, Decreased mobility, Decreased strength, Postural dysfunction  Visit Diagnosis: Unsteadiness on feet  Muscle weakness (generalized)  Other abnormalities of gait and mobility     Problem List Patient Active Problem List   Diagnosis Date Noted   Primary osteoarthritis of right knee 05/12/2020   Primary osteoarthritis of left knee 05/12/2020    Hyperlipidemia    Bilateral calf pain 03/05/2019   Genetic testing 02/13/2019   Family history of ovarian cancer    Family history of bladder cancer    Family history of colon cancer    Family history of leukemia    Lumbar radiculopathy 12/08/2018   Myofascial pain 12/08/2018   Impaired gait and mobility 12/08/2018   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   Chronic nonallergic rhinitis 12/29/2016   Mild persistent asthma, uncomplicated 28/31/5176   Vocal fold paralysis, 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   Gastroesophageal reflux disease 12/29/2016   Cervical spondylosis with myelopathy and radiculopathy 11/14/2013   Essential tremor 06/13/2013   Cervical spondylosis without myelopathy 06/13/2013   Breast cancer of lower-outer quadrant of left female breast ([place]) 09/10/2010    Willow Ora, PTA, [facility] [address] [place], [place] [address] [phone] 10/22/20, 8:56 PM   Name: ALYNN ELLITHORPE MRN: [id number] Date of Birth: 01-28-1941

## 2020-10-24 ENCOUNTER — Other Ambulatory Visit: Payer: Self-pay

## 2020-10-24 ENCOUNTER — Ambulatory Visit: Payer: Medicare Other | Attending: Internal Medicine | Admitting: Physical Therapy

## 2020-10-24 ENCOUNTER — Encounter: Payer: Self-pay | Admitting: Physical Therapy

## 2020-10-24 DIAGNOSIS — R2681 Unsteadiness on feet: Secondary | ICD-10-CM | POA: Diagnosis not present

## 2020-10-24 DIAGNOSIS — M6281 Muscle weakness (generalized): Secondary | ICD-10-CM | POA: Insufficient documentation

## 2020-10-24 DIAGNOSIS — R2689 Other abnormalities of gait and mobility: Secondary | ICD-10-CM | POA: Diagnosis not present

## 2020-10-24 DIAGNOSIS — M79661 Pain in right lower leg: Secondary | ICD-10-CM | POA: Diagnosis not present

## 2020-10-24 DIAGNOSIS — R293 Abnormal posture: Secondary | ICD-10-CM | POA: Diagnosis not present

## 2020-10-24 DIAGNOSIS — R29818 Other symptoms and signs involving the nervous system: Secondary | ICD-10-CM | POA: Insufficient documentation

## 2020-10-24 DIAGNOSIS — M79662 Pain in left lower leg: Secondary | ICD-10-CM | POA: Diagnosis not present

## 2020-10-24 NOTE — Therapy (Signed)
[facility] [address] [place], [place], [address] Phone: [phone]   Fax:  [phone]  Physical Therapy Treatment  Patient Details  Name: April Church MRN: [id number] Date of Birth: November 30, 1940 Referring Provider (PT): Dr. Courtney Heys   Encounter Date: 10/24/2020   PT End of Session - 10/24/20 1225     Visit Number 14    Number of Visits 26    Date for PT Re-Evaluation 11/28/20    Authorization Type Medicare    Progress Note Due on Visit 20    PT Start Time 1106    PT Stop Time 1145    PT Time Calculation (min) 39 min    Activity Tolerance Patient tolerated treatment well;No increased pain   pain lessened to 3/10 at end of session   Behavior During Therapy [facility] for tasks assessed/performed             Past Medical History:  Diagnosis Date   Asthma    daily inhaler   Breast cancer ([place])    left- radiation and surgery -dx. 2011- no further tx. now- [name] , [name]   Cyst of finger 11/2011   annular cyst right long finger   Dental crowns present    Dermatitis    Family history of bladder cancer    Family history of colon cancer    Family history of leukemia    Family history of ovarian cancer    Frequency of urination    GERD (gastroesophageal reflux disease)    Hemorrhoid    Hyperlipidemia    Hypertension    under control, has been on med. x 2 yrs.   Parkinson's disease ([place])    PONV (postoperative nausea and vomiting)    Seasonal allergies    Tremors of nervous system    hands   Trigger finger of right hand 11/2011   long finger    Past Surgical History:  Procedure Laterality Date   ANTERIOR CERVICAL DECOMPRESSION/DISCECTOMY FUSION 4 LEVELS N/A 11/14/2013   Procedure: ANTERIOR CERVICAL DECOMPRESSION/DISCECTOMY FUSION 4 LEVELS;  Surgeon: Newman Pies, MD;  Location: [place];  Service: Neurosurgery;  Laterality: N/A;  C34 C45 C56 C67 anterior cervical fusion with  interbody prosthesis plating and  bonegraft   APPENDECTOMY  age 36   BREAST LUMPECTOMY  06/16/2009   left; SLN bx.   BREAST LUMPECTOMY  07/01/2009   re-excision   BREAST SURGERY  1999   reduction   CATARACT EXTRACTION, BILATERAL     COLONOSCOPY WITH PROPOFOL N/A 12/03/2014   Procedure: COLONOSCOPY WITH PROPOFOL;  Surgeon: [name], MD;  Location: [facility];  Service: Endoscopy;  Laterality: N/A;   FOOT SURGERY  06/22/11   left   KNEE ARTHROSCOPY  03/12/2005   right   KNEE ARTHROSCOPY     left   NASAL SINUS SURGERY     x 2   NEPHRECTOMY LIVING DONOR Left 04/2008   donated to spouse 2010([facility])   TRIGGER FINGER RELEASE  12/16/2011   Procedure: RELEASE TRIGGER FINGER/A-1 PULLEY;  Surgeon: Tennis Must, MD;  Location: [facility];  Service: Orthopedics;  Laterality: Right;  RIGHT LONG FINGER TRIGGER RELEASE & ANNULAR CYST EXCISION   TUMOR EXCISION  age 82   right arm    There were no vitals filed for this visit.   Subjective Assessment - 10/24/20 1108     Subjective Went on a day trip yesterday to [place]; it was a good  trip, but my legs are a little stiff and painful today.    Limitations Walking;Standing    How long can you stand comfortably? < 5 minutes    How long can you walk comfortably? 10 minutes or less    Patient Stated Goals Pt's goals for therapy are to get rid of pain, and improve strength.    Currently in Pain? Yes    Pain Score 6     Pain Location Calf    Pain Orientation Right;Left    Pain Descriptors / Indicators Sore;Aching    Pain Type Chronic pain    Pain Onset More than a month ago    Aggravating Factors  worse after sitting too long    Pain Relieving Factors getting up and moving around                               [facility] Adult PT Treatment/Exercise - 10/24/20 0001       Ambulation/Gait   Ambulation/Gait Yes    Ambulation/Gait Assistance 5: Supervision    Ambulation/Gait Assistance Details Cues for  heel to toe gait pattern    Ambulation Distance (Feet) 230 Feet   x 2; 60 ft x 2   Assistive device None    Gait Pattern Step-through pattern;Decreased step length - right;Decreased step length - left;Decreased dorsiflexion - right;Decreased dorsiflexion - left;Right flexed knee in stance;Left flexed knee in stance;Decreased trunk rotation;Narrow base of support    Ambulation Surface Level;Indoor      Knee/Hip Exercises: Stretches   Active Hamstring Stretch Right;Left;3 reps;30 seconds    Active Hamstring Stretch Limitations foot propped on 4" step    Gastroc Stretch Right;Left;3 reps;20 seconds    Gastroc Stretch Limitations foot propped on 4" block in hamstring stretch position, assistance for technique      Knee/Hip Exercises: Aerobic   Nustep Level 2-3, 4 extremities, x 8 minutes for flexibility, strengthening of lower extremities; pt keeps steps/min > 45-50   At begining of session today for warm-up, due to increased stiffness in calf muscles today     Knee/Hip Exercises: Standing   Terminal Knee Extension AROM;Both;1 set;10 reps    Terminal Knee Extension Limitations tactile cues for RLE at R quads                 Balance Exercises - 10/24/20 0001       Balance Exercises: Standing   Stepping Strategy Anterior;Lateral;Foam/compliant surface;10 reps;Posterior    Rockerboard Anterior/posterior;Lateral;EO;Other reps (comment);Limitations    Rockerboard Limitations performed both ways on balance board with UE support, min guard for balance: rocking the board with emphasis on tall posture with EO,  Cues on posture and weight shifting to assist with balance on board; with lateral weightshifting, also performed reach to cabinet, with min guard through hips    Heel Raises Both;10 reps   on airex   Toe Raise Both;10 reps   on airex                PT Short Term Goals - 10/09/20 1508       PT SHORT TERM GOAL #1   Title Pt will be independent with progression of HEP for  decreased pain and improved functional strength, transfers, balance,  and gait.  TARGET 10/31/2020    Time 4    Period Weeks    Status Revised      PT SHORT TERM GOAL #2   Title Pt  will improve 5x sit<>stand to less than or equal to 18 seconds for improved functional lower extremity strength.    Baseline 10/09/20:  >21 sec    Time 4    Period Weeks    Status Revised      PT SHORT TERM GOAL #3   Title Pt will improve posterior push and release test to 2 steps or less for improved balance recovery.    Time 4    Period Weeks    Status New      PT SHORT TERM GOAL #4   Baseline 09/04/20: met in session wtih cues               PT Long Term Goals - 10/09/20 1511       PT LONG TERM GOAL #1   Title Pt will verbalize plans for continued community fitness/HEP to maximize gains made in PT.  TARGET 11/28/2020    Time 8    Period Weeks    Status New      PT LONG TERM GOAL #2   Title Pt will improve 5x sit<>stand to less than or equal to 15 seconds for improved functional strength.    Baseline 21.09 sec 10/09/2020    Time 8    Period Weeks    Status On-going      PT LONG TERM GOAL #3   Title Pt will improve MiniBESTest to at least 20/28 for decreased fall risk.    Baseline 17/28 10/09/20    Time 8    Period Weeks    Status New      PT LONG TERM GOAL #4   Title Pt will imrpove TUG score and TUG cog score to less than or equal to 10% difference for improved dual tasking with gait.    Baseline 16.9, 23.56; TUG 13 sec, TUG cogn 15 seconds 10/09/20    Time 8    Period Weeks    Status On-going      PT LONG TERM GOAL #5   Title Pt will ambulate at least 20 minutes with appropriate standing rest breaks, without c/o increased pain or fatigue for improved gait indpedence in her neighborhood.    Baseline 10 minutes before having to stop; 10/09/20:  reports 15 minutes of walking in neighborhood, " a little pain", but I can handle it.    Time 8    Period Weeks    Status Revised                    Plan - 10/24/20 1226     Clinical Impression Statement Pt with increased pain today in bilateral calves, today, following prolonged sitting trip in a church van.  She demonstrates increased crouched type posture today, with emphasis on dynamic nad static stretching activites.  Also focused on balance on compliant surfaces, with pt able to perform with light UE support.  She reports overall less pain at end of session, and PT conitnues to reiterate importance of consistent stretching, posture, attention to tall posture.    Personal Factors and Comorbidities Comorbidity 3+    Comorbidities asthma, GERD, breast cancer, HTN, cevical spondylosis, lumbar radiculopathy, OA of bilat knees, hx of donating kidney to husband    Examination-Activity Limitations Locomotion Level;Transfers;Stand    Examination-Participation Restrictions Church;Community Activity;Meal Prep    PT Frequency 2x / week    PT Duration 8 weeks   per recert, including 7/61/6073 week   PT Treatment/Interventions ADLs/Self Care Home Management;Aquatic Therapy;DME Instruction;Neuromuscular re-education;Balance  training;Therapeutic exercise;Therapeutic activities;Functional mobility training;Gait training;Patient/family education;Manual techniques;Passive range of motion    PT Next Visit Plan Check STGs next week; Continue gastroc and hamstring stretch with strengthening of hips/gluts/quads, ankle musculature, and work on standing static balance on compliant surface as well as dynamic balance (hip/ankle/step strategy) and gait activiteis.  Step strategy varied directions;    PT Home Exercise Plan Access Code: [email or web address]    Consulted and Agree with Plan of Care Patient             Patient will benefit from skilled therapeutic intervention in order to improve the following deficits and impairments:  Abnormal gait, Decreased range of motion, Pain, Decreased balance, Impaired flexibility, Decreased mobility, Decreased  strength, Postural dysfunction  Visit Diagnosis: Muscle weakness (generalized)  Other abnormalities of gait and mobility  Unsteadiness on feet     Problem List Patient Active Problem List   Diagnosis Date Noted   Primary osteoarthritis of right knee 05/12/2020   Primary osteoarthritis of left knee 05/12/2020   Hyperlipidemia    Bilateral calf pain 03/05/2019   Genetic testing 02/13/2019   Family history of ovarian cancer    Family history of bladder cancer    Family history of colon cancer    Family history of leukemia    Lumbar radiculopathy 12/08/2018   Myofascial pain 12/08/2018   Impaired gait and mobility 12/08/2018   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   Chronic nonallergic rhinitis 12/29/2016   Mild persistent asthma, uncomplicated 93/90/3009   Vocal fold paralysis, 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   Gastroesophageal reflux disease 12/29/2016   Cervical spondylosis with myelopathy and radiculopathy 11/14/2013   Essential tremor 06/13/2013   Cervical spondylosis without myelopathy 06/13/2013   Breast cancer of lower-outer quadrant of left female breast ([place]) 09/10/2010    Krystian Younglove W. 10/24/2020, 12:32 PM Frazier Butt., PT   [facility] [address] [place], [place], [address] Phone: [phone]   Fax:  [phone]  Name: April Church MRN: [id number] Date of Birth: 1940-04-05

## 2020-10-29 ENCOUNTER — Ambulatory Visit: Payer: Medicare Other | Admitting: Physical Therapy

## 2020-10-29 ENCOUNTER — Other Ambulatory Visit: Payer: Self-pay

## 2020-10-29 DIAGNOSIS — M6281 Muscle weakness (generalized): Secondary | ICD-10-CM

## 2020-10-29 DIAGNOSIS — R2689 Other abnormalities of gait and mobility: Secondary | ICD-10-CM

## 2020-10-29 DIAGNOSIS — M79661 Pain in right lower leg: Secondary | ICD-10-CM | POA: Diagnosis not present

## 2020-10-29 DIAGNOSIS — R2681 Unsteadiness on feet: Secondary | ICD-10-CM

## 2020-10-29 DIAGNOSIS — R293 Abnormal posture: Secondary | ICD-10-CM | POA: Diagnosis not present

## 2020-10-29 DIAGNOSIS — M79662 Pain in left lower leg: Secondary | ICD-10-CM | POA: Diagnosis not present

## 2020-10-30 NOTE — Therapy (Signed)
[facility] [address] [place], [place], [address] Phone: [phone]   Fax:  [phone]  Physical Therapy Treatment  Patient Details  Name: EATHER CHAIRES MRN: [id number] Date of Birth: 1940/11/27 Referring Provider (PT): Dr. Courtney Heys   Encounter Date: 10/29/2020   PT End of Session - 10/30/20 1345     Visit Number 15    Number of Visits 26    Date for PT Re-Evaluation 11/28/20    Authorization Type Medicare    Progress Note Due on Visit 20    PT Start Time 1330    PT Stop Time 1415    PT Time Calculation (min) 45 min    Equipment Utilized During Treatment Other (comment)   bar bells for assist with support/flotation   Activity Tolerance Patient tolerated treatment well;No increased pain   pain lessened to 3/10 at end of session   Behavior During Therapy [facility] for tasks assessed/performed             Past Medical History:  Diagnosis Date   Asthma    daily inhaler   Breast cancer ([place])    left- radiation and surgery -dx. 2011- no further tx. now- [name] , [name]   Cyst of finger 11/2011   annular cyst right long finger   Dental crowns present    Dermatitis    Family history of bladder cancer    Family history of colon cancer    Family history of leukemia    Family history of ovarian cancer    Frequency of urination    GERD (gastroesophageal reflux disease)    Hemorrhoid    Hyperlipidemia    Hypertension    under control, has been on med. x 2 yrs.   Parkinson's disease ([place])    PONV (postoperative nausea and vomiting)    Seasonal allergies    Tremors of nervous system    hands   Trigger finger of right hand 11/2011   long finger    Past Surgical History:  Procedure Laterality Date   ANTERIOR CERVICAL DECOMPRESSION/DISCECTOMY FUSION 4 LEVELS N/A 11/14/2013   Procedure: ANTERIOR CERVICAL DECOMPRESSION/DISCECTOMY FUSION 4 LEVELS;  Surgeon: Newman Pies, MD;  Location: [place];  Service: Neurosurgery;  Laterality: N/A;  C34 C45 C56 C67 anterior cervical fusion with interbody prosthesis plating and  bonegraft   APPENDECTOMY  age 1   BREAST LUMPECTOMY  06/16/2009   left; SLN bx.   BREAST LUMPECTOMY  07/01/2009   re-excision   BREAST SURGERY  1999   reduction   CATARACT EXTRACTION, BILATERAL     COLONOSCOPY WITH PROPOFOL N/A 12/03/2014   Procedure: COLONOSCOPY WITH PROPOFOL;  Surgeon: [name], MD;  Location: [facility];  Service: Endoscopy;  Laterality: N/A;   FOOT SURGERY  06/22/11   left   KNEE ARTHROSCOPY  03/12/2005   right   KNEE ARTHROSCOPY     left   NASAL SINUS SURGERY     x 2   NEPHRECTOMY LIVING DONOR Left 04/2008   donated to spouse 2010([facility])   TRIGGER FINGER RELEASE  12/16/2011   Procedure: RELEASE TRIGGER FINGER/A-1 PULLEY;  Surgeon: Tennis Must, MD;  Location: [facility];  Service: Orthopedics;  Laterality: Right;  RIGHT LONG FINGER TRIGGER RELEASE & ANNULAR CYST EXCISION   TUMOR EXCISION  age 14   right arm    There were no vitals filed for this visit.   Subjective Assessment - 10/30/20 1341  Subjective Pt presents to aquatic therapy for initial visit - states she drove to [facility] on Friday and her right calf was very sore by the time she got there - pt states Rt calf is more sore than her Lt one    Limitations Walking;Standing    How long can you stand comfortably? < 5 minutes    How long can you walk comfortably? 10 minutes or less    Patient Stated Goals Pt's goals for therapy are to get rid of pain, and improve strength.    Currently in Pain? Yes    Pain Score 4     Pain Location Calf    Pain Orientation Right;Left    Pain Descriptors / Indicators Sore;Aching    Pain Type Chronic pain    Pain Onset More than a month ago    Pain Frequency Intermittent    Aggravating Factors  was worse after driving - pushing gas pedal aggravates    Pain Relieving Factors stretching                Aquatic therapy at [facility] - pool temp 92 degrees   Patient seen for aquatic therapy today.  Treatment took place in water 3.5-4 feet deep depending upon activity.  Pt entered  And exited the pool via step negotiation with use of bilateral hand rails with supervision.  Pt performed runner's stretch for hamstring/gastroc stretching RLE & LLE 30 sec x 1 rep each leg; heel cord stretch performed in standing by placing toes/forefoot On pool wall - 20 sec hold x 1 rep each   Pt performed balance exercises in 4' water depth - marching in place 10 reps; marching forwards across pool 18' x 4 reps; marching backwards 18' x 2 reps with min assist for recovery of LOB  Heel raises bil. 10 reps with bil. UE support on pool wall; unilateral heel raise 10 reps each leg with bil. UE support  Pt performed squats 10 reps with minimal bil. UE support on pool wall; unilateral squats 5 reps each leg with UE support  Pt performed standing hip flexion/extension 10 reps each leg and abduction 10 reps each leg with RUE support on pool wall  Pt performed water walking - forwards, sideways, backwards 18' x 2 reps each across width of pool; sidestepping with squats 18'x x 2 reps with pt Holding bar bells for assist with balance  Worked on sit to stand transfers  - sitting on pool bench - pt held bar bells - tactile and verbal cues to push bar bells toward PT to facilitate trunk flexion for anterior weight shift and cues to stand tall - 10 reps  Pt requires buoyancy of water for joint offloading for pain reduction in bil. LE's and for support with balance activities for reduced fall risk; viscosity of water needed for resistance for strengthening. The water provides support and eliminates fall risk with balance and gait training than is experienced with land exercises and activities.                            PT Short Term Goals - 10/30/20 1346       PT SHORT TERM GOAL #1   Title  Pt will be independent with progression of HEP for decreased pain and improved functional strength, transfers, balance,  and gait.  TARGET 10/31/2020    Time 4    Period Weeks    Status Revised  PT SHORT TERM GOAL #2   Title Pt will improve 5x sit<>stand to less than or equal to 18 seconds for improved functional lower extremity strength.    Baseline 10/09/20:  >21 sec    Time 4    Period Weeks    Status Revised      PT SHORT TERM GOAL #3   Title Pt will improve posterior push and release test to 2 steps or less for improved balance recovery.    Time 4    Period Weeks    Status New      PT SHORT TERM GOAL #4   Baseline 09/04/20: met in session wtih cues               PT Long Term Goals - 10/30/20 1346       PT LONG TERM GOAL #1   Title Pt will verbalize plans for continued community fitness/HEP to maximize gains made in PT.  TARGET 11/28/2020    Time 8    Period Weeks    Status New      PT LONG TERM GOAL #2   Title Pt will improve 5x sit<>stand to less than or equal to 15 seconds for improved functional strength.    Baseline 21.09 sec 10/09/2020    Time 8    Period Weeks    Status On-going      PT LONG TERM GOAL #3   Title Pt will improve MiniBESTest to at least 20/28 for decreased fall risk.    Baseline 17/28 10/09/20    Time 8    Period Weeks    Status New      PT LONG TERM GOAL #4   Title Pt will imrpove TUG score and TUG cog score to less than or equal to 10% difference for improved dual tasking with gait.    Baseline 16.9, 23.56; TUG 13 sec, TUG cogn 15 seconds 10/09/20    Time 8    Period Weeks    Status On-going      PT LONG TERM GOAL #5   Title Pt will ambulate at least 20 minutes with appropriate standing rest breaks, without c/o increased pain or fatigue for improved gait indpedence in her neighborhood.    Baseline 10 minutes before having to stop; 10/09/20:  reports 15 minutes of walking in neighborhood, " a little pain", but I can handle it.    Time  8    Period Weeks    Status Revised                   Plan - 10/30/20 1347     Clinical Impression Statement Aquatic therapy session focused on bil. calf stretching, LE strengthening and balance and gait training.  Worked on anterior weight shift with sit to stand transfers with bar bells used for tactile cues to push/lean forward to increase trunk flexion for anterior weight shift; pt had tendency to extend trunk before completing full anterior weight shift, but was able to perform correctly with verbal & tactile cues.  Pt did c/o Rt calf discomfort approx. 1/2 way through session, but stated it was not excessive and that she could continue.  Pt had slight unsteadiness with water walking - bar bells were used to assist with balance.  Cont with POC.    Personal Factors and Comorbidities Comorbidity 3+    Comorbidities asthma, GERD, breast cancer, HTN, cevical spondylosis, lumbar radiculopathy, OA of bilat knees, hx of donating kidney to husband  Examination-Activity Limitations Locomotion Level;Transfers;Stand    Examination-Participation Restrictions Church;Community Activity;Meal Prep    PT Frequency 2x / week    PT Duration 8 weeks   per recert, including 0/27/7412 week   PT Treatment/Interventions ADLs/Self Care Home Management;Aquatic Therapy;DME Instruction;Neuromuscular re-education;Balance training;Therapeutic exercise;Therapeutic activities;Functional mobility training;Gait training;Patient/family education;Manual techniques;Passive range of motion    PT Next Visit Plan Check STGs next week; Continue gastroc and hamstring stretch with strengthening of hips/gluts/quads, ankle musculature, and work on standing static balance on compliant surface as well as dynamic balance (hip/ankle/step strategy) and gait activiteis.  Step strategy varied directions;    PT Home Exercise Plan Access Code: [email or web address]    Consulted and Agree with Plan of Care Patient             Patient will  benefit from skilled therapeutic intervention in order to improve the following deficits and impairments:  Abnormal gait, Decreased range of motion, Pain, Decreased balance, Impaired flexibility, Decreased mobility, Decreased strength, Postural dysfunction  Visit Diagnosis: Muscle weakness (generalized)  Unsteadiness on feet  Other abnormalities of gait and mobility     Problem List Patient Active Problem List   Diagnosis Date Noted   Primary osteoarthritis of right knee 05/12/2020   Primary osteoarthritis of left knee 05/12/2020   Hyperlipidemia    Bilateral calf pain 03/05/2019   Genetic testing 02/13/2019   Family history of ovarian cancer    Family history of bladder cancer    Family history of colon cancer    Family history of leukemia    Lumbar radiculopathy 12/08/2018   Myofascial pain 12/08/2018   Impaired gait and mobility 12/08/2018   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   Chronic nonallergic rhinitis 12/29/2016   Mild persistent asthma, uncomplicated 87/86/7672   Vocal fold paralysis, 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   Gastroesophageal reflux disease 12/29/2016   Cervical spondylosis with myelopathy and radiculopathy 11/14/2013   Essential tremor 06/13/2013   Cervical spondylosis without myelopathy 06/13/2013   Breast cancer of lower-outer quadrant of left female breast ([place]) 09/10/2010    [place], [place], [place], [place] 10/30/2020, 4:17 PM  [facility] [address] [place], [place], [address] Phone: [phone]   Fax:  [phone]  Name: LAQUASHIA MERGENTHALER MRN: [id number] Date of Birth: 1940/12/21

## 2020-10-31 ENCOUNTER — Ambulatory Visit: Payer: Medicare Other | Admitting: Physical Therapy

## 2020-10-31 ENCOUNTER — Other Ambulatory Visit: Payer: Self-pay

## 2020-10-31 ENCOUNTER — Encounter: Payer: Self-pay | Admitting: Physical Therapy

## 2020-10-31 DIAGNOSIS — M6281 Muscle weakness (generalized): Secondary | ICD-10-CM

## 2020-10-31 DIAGNOSIS — R2689 Other abnormalities of gait and mobility: Secondary | ICD-10-CM | POA: Diagnosis not present

## 2020-10-31 DIAGNOSIS — R2681 Unsteadiness on feet: Secondary | ICD-10-CM | POA: Diagnosis not present

## 2020-10-31 DIAGNOSIS — M79662 Pain in left lower leg: Secondary | ICD-10-CM | POA: Diagnosis not present

## 2020-10-31 DIAGNOSIS — R293 Abnormal posture: Secondary | ICD-10-CM | POA: Diagnosis not present

## 2020-10-31 DIAGNOSIS — M79661 Pain in right lower leg: Secondary | ICD-10-CM | POA: Diagnosis not present

## 2020-11-02 NOTE — Therapy (Signed)
[facility] [address] [place], [place], [address] Phone: [phone]   Fax:  [phone]  Physical Therapy Treatment  Patient Details  Name: April Church MRN: [id number] Date of Birth: [birth year] Referring Provider (PT): Dr. Courtney Heys   Encounter Date: 10/31/2020    10/31/20 0935  PT Visits / Re-Eval  Visit Number 16  Number of Visits 26  Date for PT Re-Evaluation 11/28/20  Authorization  Authorization Type Medicare  Progress Note Due on Visit 20  PT Time Calculation  PT Start Time 0931  PT Stop Time 1015  PT Time Calculation (min) 44 min  PT - End of Session  Equipment Utilized During Treatment Other (gait belt)  Activity Tolerance Patient tolerated treatment well;No increased pain  Behavior During Therapy WFL for tasks assessed/performed    Past Medical History:  Diagnosis Date   Asthma    daily inhaler   Breast cancer ([place])    left- radiation and surgery -dx. 2011- no further tx. now- [name] , [name]   Cyst of finger 11/2011   annular cyst right long finger   Dental crowns present    Dermatitis    Family history of bladder cancer    Family history of colon cancer    Family history of leukemia    Family history of ovarian cancer    Frequency of urination    GERD (gastroesophageal reflux disease)    Hemorrhoid    Hyperlipidemia    Hypertension    under control, has been on med. x 2 yrs.   Parkinson's disease ([place])    PONV (postoperative nausea and vomiting)    Seasonal allergies    Tremors of nervous system    hands   Trigger finger of right hand 11/2011   long finger    Past Surgical History:  Procedure Laterality Date   ANTERIOR CERVICAL DECOMPRESSION/DISCECTOMY FUSION 4 LEVELS N/A 11/14/2013   Procedure: ANTERIOR CERVICAL DECOMPRESSION/DISCECTOMY FUSION 4 LEVELS;  Surgeon: Newman Pies, MD;  Location: [facility];  Service: Neurosurgery;  Laterality: N/A;  C34 C45  C56 C67 anterior cervical fusion with interbody prosthesis plating and  bonegraft   APPENDECTOMY  age 80   BREAST LUMPECTOMY  06/16/2009   left; SLN bx.   BREAST LUMPECTOMY  07/01/2009   re-excision   BREAST SURGERY  1999   reduction   CATARACT EXTRACTION, BILATERAL     COLONOSCOPY WITH PROPOFOL N/A 12/03/2014   Procedure: COLONOSCOPY WITH PROPOFOL;  Surgeon: [name], MD;  Location: [facility];  Service: Endoscopy;  Laterality: N/A;   FOOT SURGERY  06/22/11   left   KNEE ARTHROSCOPY  03/12/2005   right   KNEE ARTHROSCOPY     left   NASAL SINUS SURGERY     x 2   NEPHRECTOMY LIVING DONOR Left 04/2008   donated to spouse 2010([facility])   TRIGGER FINGER RELEASE  12/16/2011   Procedure: RELEASE TRIGGER FINGER/A-1 PULLEY;  Surgeon: Tennis Must, MD;  Location: [facility];  Service: Orthopedics;  Laterality: Right;  RIGHT LONG FINGER TRIGGER RELEASE & ANNULAR CYST EXCISION   TUMOR EXCISION  age 80   right arm    There were no vitals filed for this visit.    10/31/20 0933  Symptoms/Limitations  Subjective Had an aquatic session on Wed, was tired and sore afterwards, no pain. No falls to report.  Limitations Walking;Standing  How long can you stand comfortably? < 5 minutes  Patient Stated  Goals Pt's goals for therapy are to get rid of pain, and improve strength.  Pain Assessment  Currently in Pain? Yes  Pain Score 3  Pain Location Calf  Pain Orientation Right  Pain Descriptors / Indicators Aching;Sore  Pain Type Chronic pain  Pain Onset More than a month ago  Pain Frequency Intermittent  Aggravating Factors  driving makes it worse,, was sore after pool session as well  Pain Relieving Factors stetching, rolling pin     10/31/20 0936  Transfers  Transfers Sit to Stand;Stand to Sit  Sit to Stand 5: Supervision;With upper extremity assist;Without upper extremity assist;From bed;From chair/3-in-1  Five time sit to stand comments  15.88 sec's no UE support  from standard height surface  Stand to Sit 5: Supervision;With upper extremity assist;Without upper extremity assist;To bed;To chair/3-in-1  Ambulation/Gait  Ambulation/Gait Yes  Ambulation/Gait Assistance 5: Supervision  Ambulation/Gait Assistance Details working on heel>toe step progression and increased reciprocal arm swing with gait.  Ambulation Distance (Feet) 420 Feet (x1, plus around clinic with session)  Assistive device None  Gait Pattern Step-through pattern;Decreased step length - right;Decreased step length - left;Decreased dorsiflexion - right;Decreased dorsiflexion - left;Right flexed knee in stance;Left flexed knee in stance;Decreased trunk rotation;Narrow base of support  Ambulation Surface Level;Indoor  Neuro Re-ed   Neuro Re-ed Details  tested stepping strategy: posterior push and release performed with pt taking 2 steps to catch balance; for balance/muscle re-ed: on airex- heel<>toe raises x 10 reps, then alternating slow marching for 10 reps each side, light UE support for balance with cues on form/technique. then on airex with no UE support: feet together for EC 30 sec's x 3 reps, then with feet hip width apart for EC head movements left<>right, up<>down for ~5 reps each.  Knee/Hip Exercises: Stretches  Active Hamstring Stretch Both;3 reps;30 seconds;Limitations  Active Hamstring Stretch Limitations seated with heel propped on 4 inch box, PTA providing gentle overpressure at knee to encourage maintaining knee extension as pt leaned forward at hips for stretch.  Manual Therapy  Manual Therapy Other (comment)  Manual therapy comments seated with foot propped on stool- use of while foam roll on bil calves for 3-4 minutes each with light to medium pressure for decreased muscle tightness.        PT Short Term Goals - 10/31/20 2992       PT SHORT TERM GOAL #1   Title Pt will be independent with progression of HEP for decreased pain and improved functional strength,  transfers, balance,  and gait.  TARGET 10/31/2020    Baseline 10/31/20: met with current program, will benefit from advancement as pt progresses    Status Achieved      PT SHORT TERM GOAL #2   Title Pt will improve 5x sit<>stand to less than or equal to 18 seconds for improved functional lower extremity strength.    Baseline 10/09/20:  >21 sec    Time 4    Period Weeks    Status Revised      PT SHORT TERM GOAL #3   Title Pt will improve posterior push and release test to 2 steps or less for improved balance recovery.    Time 4    Period Weeks    Status New      PT SHORT TERM GOAL #4   Baseline 09/04/20: met in session wtih cues               PT Long Term Goals - 10/30/20 1346  PT LONG TERM GOAL #1   Title Pt will verbalize plans for continued community fitness/HEP to maximize gains made in PT.  TARGET 11/28/2020    Time 8    Period Weeks    Status New      PT LONG TERM GOAL #2   Title Pt will improve 5x sit<>stand to less than or equal to 15 seconds for improved functional strength.    Baseline 21.09 sec 10/09/2020    Time 8    Period Weeks    Status On-going      PT LONG TERM GOAL #3   Title Pt will improve MiniBESTest to at least 20/28 for decreased fall risk.    Baseline 17/28 10/09/20    Time 8    Period Weeks    Status New      PT LONG TERM GOAL #4   Title Pt will imrpove TUG score and TUG cog score to less than or equal to 10% difference for improved dual tasking with gait.    Baseline 16.9, 23.56; TUG 13 sec, TUG cogn 15 seconds 10/09/20    Time 8    Period Weeks    Status On-going      PT LONG TERM GOAL #5   Title Pt will ambulate at least 20 minutes with appropriate standing rest breaks, without c/o increased pain or fatigue for improved gait indpedence in her neighborhood.    Baseline 10 minutes before having to stop; 10/09/20:  reports 15 minutes of walking in neighborhood, " a little pain", but I can handle it.    Time 8    Period Weeks    Status  Revised               10/31/20 0935  Plan  Clinical Impression Statement Today's skilled session continued to focus on LE stretching, balance training and gait training with emphasis on heel>toe step progression. Session also focused on progress toward STGs with all goals met. The pt is progressing toward goals and should benefit from continued PT to progress toward unmet goals.  Personal Factors and Comorbidities Comorbidity 3+  Comorbidities asthma, GERD, breast cancer, HTN, cevical spondylosis, lumbar radiculopathy, OA of bilat knees, hx of donating kidney to husband  Examination-Activity Limitations Locomotion Level;Transfers;Stand  Examination-Participation Restrictions Church;Community Activity;Meal Prep  Pt will benefit from skilled therapeutic intervention in order to improve on the following deficits Abnormal gait;Decreased range of motion;Pain;Decreased balance;Impaired flexibility;Decreased mobility;Decreased strength;Postural dysfunction  PT Frequency 2x / week  PT Duration 8 weeks (per recert, including 10/09/2020 week)  PT Treatment/Interventions ADLs/Self Care Home Management;Aquatic Therapy;DME Instruction;Neuromuscular re-education;Balance training;Therapeutic exercise;Therapeutic activities;Functional mobility training;Gait training;Patient/family education;Manual techniques;Passive range of motion  PT Next Visit Plan Continue gastroc and hamstring stretch with strengthening of hips/gluts/quads, ankle musculature, and work on standing static balance on compliant surface as well as dynamic balance (hip/ankle/step strategy) and gait activiteis.  Step strategy varied directions;  PT Home Exercise Plan Access Code: [email or web address]  Consulted and Agree with Plan of Care Patient         Patient will benefit from skilled therapeutic intervention in order to improve the following deficits and impairments:  Abnormal gait, Decreased range of motion, Pain, Decreased balance, Impaired  flexibility, Decreased mobility, Decreased strength, Postural dysfunction  Visit Diagnosis: Muscle weakness (generalized)  Unsteadiness on feet  Other abnormalities of gait and mobility     Problem List Patient Active Problem List   Diagnosis Date Noted   Primary osteoarthritis of right knee 05/12/2020   Primary   osteoarthritis of left knee 05/12/2020   Hyperlipidemia    Bilateral calf pain 03/05/2019   Genetic testing 02/13/2019   Family history of ovarian cancer    Family history of bladder cancer    Family history of colon cancer    Family history of leukemia    Lumbar radiculopathy 12/08/2018   Myofascial pain 12/08/2018   Impaired gait and mobility 12/08/2018   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   Chronic nonallergic rhinitis 12/29/2016   Mild persistent asthma, uncomplicated 78/93/8101   Vocal fold paralysis, 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   Gastroesophageal reflux disease 12/29/2016   Cervical spondylosis with myelopathy and radiculopathy 11/14/2013   Essential tremor 06/13/2013   Cervical spondylosis without myelopathy 06/13/2013   Breast cancer of lower-outer quadrant of left female breast ([place]) 09/10/2010    Willow Ora, PTA, [facility] [address], [place], [place] [address] [phone] 11/02/20, 10:35 PM   Name: CAIA LOFARO MRN: [id number] Date of Birth: 23-Dec-1940

## 2020-11-05 ENCOUNTER — Ambulatory Visit: Payer: Medicare Other | Admitting: Physical Therapy

## 2020-11-05 ENCOUNTER — Other Ambulatory Visit: Payer: Self-pay

## 2020-11-05 DIAGNOSIS — R2681 Unsteadiness on feet: Secondary | ICD-10-CM

## 2020-11-05 DIAGNOSIS — R2689 Other abnormalities of gait and mobility: Secondary | ICD-10-CM

## 2020-11-05 DIAGNOSIS — M6281 Muscle weakness (generalized): Secondary | ICD-10-CM

## 2020-11-05 DIAGNOSIS — M79661 Pain in right lower leg: Secondary | ICD-10-CM | POA: Diagnosis not present

## 2020-11-05 DIAGNOSIS — M79662 Pain in left lower leg: Secondary | ICD-10-CM | POA: Diagnosis not present

## 2020-11-05 DIAGNOSIS — R293 Abnormal posture: Secondary | ICD-10-CM | POA: Diagnosis not present

## 2020-11-06 NOTE — Therapy (Signed)
[facility] [address] [place], [place], [address] Phone: [phone]   Fax:  [phone]  Physical Therapy Treatment  Patient Details  Name: April Church MRN: [id number] Date of Birth: [birth year] Referring Provider (PT): Dr. Courtney Heys   Encounter Date: 11/05/2020   PT End of Session - 11/06/20 1239     Visit Number 17    Number of Visits 26    Date for PT Re-Evaluation 11/28/20    Authorization Type Medicare    Progress Note Due on Visit 20    PT Start Time 1330    PT Stop Time 1415    PT Time Calculation (min) 45 min    Equipment Utilized During Treatment Other (comment)   bar bells, aquatic cuffs, pool noodle   Activity Tolerance Patient tolerated treatment well;No increased pain   pain lessened to 3/10 at end of session   Behavior During Therapy [facility] for tasks assessed/performed             Past Medical History:  Diagnosis Date   Asthma    daily inhaler   Breast cancer ([place])    left- radiation and surgery -dx. 2011- no further tx. now- [name] , [name]   Cyst of finger 11/2011   annular cyst right long finger   Dental crowns present    Dermatitis    Family history of bladder cancer    Family history of colon cancer    Family history of leukemia    Family history of ovarian cancer    Frequency of urination    GERD (gastroesophageal reflux disease)    Hemorrhoid    Hyperlipidemia    Hypertension    under control, has been on med. x 2 yrs.   Parkinson's disease ([place])    PONV (postoperative nausea and vomiting)    Seasonal allergies    Tremors of nervous system    hands   Trigger finger of right hand 11/2011   long finger    Past Surgical History:  Procedure Laterality Date   ANTERIOR CERVICAL DECOMPRESSION/DISCECTOMY FUSION 4 LEVELS N/A 11/14/2013   Procedure: ANTERIOR CERVICAL DECOMPRESSION/DISCECTOMY FUSION 4 LEVELS;  Surgeon: Newman Pies, MD;  Location: [facility];   Service: Neurosurgery;  Laterality: N/A;  C34 C45 C56 C67 anterior cervical fusion with interbody prosthesis plating and  bonegraft   APPENDECTOMY  age 81   BREAST LUMPECTOMY  06/16/2009   left; SLN bx.   BREAST LUMPECTOMY  07/01/2009   re-excision   BREAST SURGERY  1999   reduction   CATARACT EXTRACTION, BILATERAL     COLONOSCOPY WITH PROPOFOL N/A 12/03/2014   Procedure: COLONOSCOPY WITH PROPOFOL;  Surgeon: [name], MD;  Location: [facility];  Service: Endoscopy;  Laterality: N/A;   FOOT SURGERY  06/22/11   left   KNEE ARTHROSCOPY  03/12/2005   right   KNEE ARTHROSCOPY     left   NASAL SINUS SURGERY     x 2   NEPHRECTOMY LIVING DONOR Left 04/2008   donated to spouse 2010([facility])   TRIGGER FINGER RELEASE  12/16/2011   Procedure: RELEASE TRIGGER FINGER/A-1 PULLEY;  Surgeon: Tennis Must, MD;  Location: [facility];  Service: Orthopedics;  Laterality: Right;  RIGHT LONG FINGER TRIGGER RELEASE & ANNULAR CYST EXCISION   TUMOR EXCISION  age 9   right arm    There were no vitals filed for this visit.   Subjective Assessment - 11/06/20 1237  Subjective Pt states she was tired after aquatic therapy session last week; had PT on land last Friday and was sore after land session also    Limitations Walking;Standing    How long can you stand comfortably? < 5 minutes    Patient Stated Goals Pt's goals for therapy are to get rid of pain, and improve strength.    Currently in Pain? Yes    Pain Score 3     Pain Location Calf    Pain Orientation Right    Pain Descriptors / Indicators Aching;Sore    Pain Type Chronic pain    Pain Onset More than a month ago    Pain Frequency Intermittent                Aquatic therapy at [facility] - pool temp 94 degrees   Patient seen for aquatic therapy today.  Treatment took place in water 3.5-4 feet deep depending upon activity.  Pt entered  And exited the pool via step negotiation with use of bilateral hand rails  with supervision.  Pt performed runner's stretch for hamstring/gastroc stretching RLE & LLE 30 sec x 1 rep each leg; heel cord stretch performed in standing by placing toes/forefoot On pool wall - 30 sec hold x 1 rep each leg  Pt performed balance exercises in 4' water depth - marching in place 10 reps; marching forwards across pool 18' x 2 reps; marching backwards 18' x 2 reps with CGA - pt held bar bells for UE support for balance  Heel raises bil. 10 reps with bil. UE support on pool wall; unilateral heel raise 10 reps each leg with bil. UE support  Pt performed squats 10 reps with minimal bil. UE support on pool wall  Pt performed standing hip flexion, extension, and abduction with use of aquatic cuffs for resistance with the eccentric contraction - 10 reps each leg each direction  Pt performed water walking - forwards, sideways, backwards 18' x 2 reps each across width of pool; pt performed crossovers 18' x 2 reps across pool, then performed stepping behind 18' x 2 reps with bil. UE support on PT's forearms  Pt performed Ai Chi posture 10 reps - pt did not use pool wall for balance but did have unsteadiness with performing this exercise Balancing - Ai Chi posture - with tactile cue on UE for correct sequence (contralateral UE /LE movement) - pt needed intermittent UE support for balance with this exercise; performed 5 reps on each side  Pt requires buoyancy of water for joint offloading for pain reduction in bil. LE's and for support with balance activities for reduced fall risk; viscosity of water needed for resistance for strengthening. The water provides support and eliminates fall risk with balance and gait training than is experienced with land exercises and activities.                                  PT Short Term Goals - 11/06/20 1257       PT SHORT TERM GOAL #1   Title Pt will be independent with progression of HEP for decreased pain and improved  functional strength, transfers, balance,  and gait.  TARGET 10/31/2020    Baseline 10/31/20: met with current program, will benefit from advancement as pt progresses    Status Achieved      PT SHORT TERM GOAL #2   Title Pt will improve 5x sit<>stand  to less than or equal to 18 seconds for improved functional lower extremity strength.    Baseline 10/31/20: 15.88 sec's no UE support from standard height surface    Status Achieved      PT SHORT TERM GOAL #3   Title Pt will improve posterior push and release test to 2 steps or less for improved balance recovery.    Baseline 10/31/20: met in session today    Status Achieved      PT SHORT TERM GOAL #4   Baseline 7/1               PT Long Term Goals - 11/06/20 1257       PT LONG TERM GOAL #1   Title Pt will verbalize plans for continued community fitness/HEP to maximize gains made in PT.  TARGET 11/28/2020    Time 8    Period Weeks    Status New      PT LONG TERM GOAL #2   Title Pt will improve 5x sit<>stand to less than or equal to 15 seconds for improved functional strength.    Baseline 21.09 sec 10/09/2020    Time 8    Period Weeks    Status On-going      PT LONG TERM GOAL #3   Title Pt will improve MiniBESTest to at least 20/28 for decreased fall risk.    Baseline 17/28 10/09/20    Time 8    Period Weeks    Status New      PT LONG TERM GOAL #4   Title Pt will imrpove TUG score and TUG cog score to less than or equal to 10% difference for improved dual tasking with gait.    Baseline 16.9, 23.56; TUG 13 sec, TUG cogn 15 seconds 10/09/20    Time 8    Period Weeks    Status On-going      PT LONG TERM GOAL #5   Title Pt will ambulate at least 20 minutes with appropriate standing rest breaks, without c/o increased pain or fatigue for improved gait indpedence in her neighborhood.    Baseline 10 minutes before having to stop; 10/09/20:  reports 15 minutes of walking in neighborhood, " a little pain", but I can handle it.    Time 8     Period Weeks    Status Revised                   Plan - 11/06/20 1240     Clinical Impression Statement Aquatic therapy session focused on balance training, gait training with reciprocal arm swing, and bil. LE strengthening and stretching. Pt demonstrated improvement in balance with water walking in today's session compared to that in previous session last week.  Pt used aquatic cuffs for resistance for bil. LE strengthening and tolerated exercises well.  Pt continues to have difficulty maintaining balance with Ai Chi posture (Enclosing), indicative of decreased core stabilization.  Cont with POC.    Personal Factors and Comorbidities Comorbidity 3+    Comorbidities asthma, GERD, breast cancer, HTN, cevical spondylosis, lumbar radiculopathy, OA of bilat knees, hx of donating kidney to husband    Examination-Activity Limitations Locomotion Level;Transfers;Stand    Examination-Participation Restrictions Church;Community Activity;Meal Prep    PT Frequency 2x / week    PT Duration 8 weeks   per recert, including 0/24/0973 week   PT Treatment/Interventions ADLs/Self Care Home Management;Aquatic Therapy;DME Instruction;Neuromuscular re-education;Balance training;Therapeutic exercise;Therapeutic activities;Functional mobility training;Gait training;Patient/family education;Manual techniques;Passive range of motion  PT Next Visit Plan Continue gastroc and hamstring stretch with strengthening of hips/gluts/quads, ankle musculature, and work on standing static balance on compliant surface as well as dynamic balance (hip/ankle/step strategy) and gait activiteis.  Step strategy varied directions;    PT Home Exercise Plan Access Code: [email or web address]    Consulted and Agree with Plan of Care Patient             Patient will benefit from skilled therapeutic intervention in order to improve the following deficits and impairments:  Abnormal gait, Decreased range of motion, Pain, Decreased balance,  Impaired flexibility, Decreased mobility, Decreased strength, Postural dysfunction  Visit Diagnosis: Muscle weakness (generalized)  Unsteadiness on feet  Other abnormalities of gait and mobility     Problem List Patient Active Problem List   Diagnosis Date Noted   Primary osteoarthritis of right knee 05/12/2020   Primary osteoarthritis of left knee 05/12/2020   Hyperlipidemia    Bilateral calf pain 03/05/2019   Genetic testing 02/13/2019   Family history of ovarian cancer    Family history of bladder cancer    Family history of colon cancer    Family history of leukemia    Lumbar radiculopathy 12/08/2018   Myofascial pain 12/08/2018   Impaired gait and mobility 12/08/2018   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   Chronic nonallergic rhinitis 12/29/2016   Mild persistent asthma, uncomplicated 93/71/6967   Vocal fold paralysis, 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   Gastroesophageal reflux disease 12/29/2016   Cervical spondylosis with myelopathy and radiculopathy 11/14/2013   Essential tremor 06/13/2013   Cervical spondylosis without myelopathy 06/13/2013   Breast cancer of lower-outer quadrant of left female breast ([place]) 09/10/2010    Dohn Stclair, [place], PT, [place] 11/06/2020, 1:00 PM  [facility] [address] [place], [place], [address] Phone: [phone]   Fax:  [phone]  Name: April Church MRN: [id number] Date of Birth: 1940/05/28

## 2020-11-07 ENCOUNTER — Encounter: Payer: Self-pay | Admitting: Physical Therapy

## 2020-11-07 ENCOUNTER — Ambulatory Visit: Payer: Medicare Other | Admitting: Physical Therapy

## 2020-11-07 ENCOUNTER — Other Ambulatory Visit: Payer: Self-pay

## 2020-11-07 DIAGNOSIS — R293 Abnormal posture: Secondary | ICD-10-CM | POA: Diagnosis not present

## 2020-11-07 DIAGNOSIS — M79661 Pain in right lower leg: Secondary | ICD-10-CM

## 2020-11-07 DIAGNOSIS — M6281 Muscle weakness (generalized): Secondary | ICD-10-CM

## 2020-11-07 DIAGNOSIS — R2681 Unsteadiness on feet: Secondary | ICD-10-CM

## 2020-11-07 DIAGNOSIS — R2689 Other abnormalities of gait and mobility: Secondary | ICD-10-CM | POA: Diagnosis not present

## 2020-11-07 DIAGNOSIS — M79662 Pain in left lower leg: Secondary | ICD-10-CM | POA: Diagnosis not present

## 2020-11-07 DIAGNOSIS — R29818 Other symptoms and signs involving the nervous system: Secondary | ICD-10-CM

## 2020-11-08 NOTE — Therapy (Signed)
[facility] [address] [place], [place], [address] Phone: [phone]   Fax:  [phone]  Physical Therapy Treatment  Patient Details  Name: April Church MRN: [id number] Date of Birth: 04/15/1940 Referring Provider (PT): Dr. Courtney Heys   Encounter Date: 11/07/2020   PT End of Session - 11/07/20 0946     Visit Number 18    Number of Visits 26    Date for PT Re-Evaluation 11/28/20    Authorization Type Medicare    Progress Note Due on Visit 20    PT Start Time 0935    PT Stop Time 1015    PT Time Calculation (min) 40 min    Equipment Utilized During Treatment Gait belt    Activity Tolerance Patient tolerated treatment well;No increased pain   pain lessened to 3/10 at end of session   Behavior During Therapy [facility] for tasks assessed/performed             Past Medical History:  Diagnosis Date   Asthma    daily inhaler   Breast cancer ([place])    left- radiation and surgery -dx. 2011- no further tx. now- [name] , [name]   Cyst of finger 11/2011   annular cyst right long finger   Dental crowns present    Dermatitis    Family history of bladder cancer    Family history of colon cancer    Family history of leukemia    Family history of ovarian cancer    Frequency of urination    GERD (gastroesophageal reflux disease)    Hemorrhoid    Hyperlipidemia    Hypertension    under control, has been on med. x 2 yrs.   Parkinson's disease ([place])    PONV (postoperative nausea and vomiting)    Seasonal allergies    Tremors of nervous system    hands   Trigger finger of right hand 11/2011   long finger    Past Surgical History:  Procedure Laterality Date   ANTERIOR CERVICAL DECOMPRESSION/DISCECTOMY FUSION 4 LEVELS N/A 11/14/2013   Procedure: ANTERIOR CERVICAL DECOMPRESSION/DISCECTOMY FUSION 4 LEVELS;  Surgeon: Newman Pies, MD;  Location: [facility];  Service: Neurosurgery;  Laterality: N/A;   C34 C45 C56 C67 anterior cervical fusion with interbody prosthesis plating and  bonegraft   APPENDECTOMY  age 27   BREAST LUMPECTOMY  06/16/2009   left; SLN bx.   BREAST LUMPECTOMY  07/01/2009   re-excision   BREAST SURGERY  1999   reduction   CATARACT EXTRACTION, BILATERAL     COLONOSCOPY WITH PROPOFOL N/A 12/03/2014   Procedure: COLONOSCOPY WITH PROPOFOL;  Surgeon: [name], MD;  Location: [facility];  Service: Endoscopy;  Laterality: N/A;   FOOT SURGERY  06/22/11   left   KNEE ARTHROSCOPY  03/12/2005   right   KNEE ARTHROSCOPY     left   NASAL SINUS SURGERY     x 2   NEPHRECTOMY LIVING DONOR Left 04/2008   donated to spouse 2010([facility])   TRIGGER FINGER RELEASE  12/16/2011   Procedure: RELEASE TRIGGER FINGER/A-1 PULLEY;  Surgeon: Tennis Must, MD;  Location: [facility];  Service: Orthopedics;  Laterality: Right;  RIGHT LONG FINGER TRIGGER RELEASE & ANNULAR CYST EXCISION   TUMOR EXCISION  age 27   right arm    There were no vitals filed for this visit.   Subjective Assessment - 11/07/20 0936     Subjective No new complaints.  No falls. Not as sore after most recent pool session.    Limitations Walking;Standing    How long can you stand comfortably? < 5 minutes    Patient Stated Goals Pt's goals for therapy are to get rid of pain, and improve strength.    Currently in Pain? Yes    Pain Score 1     Pain Location Calf    Pain Orientation Right    Pain Type Chronic pain    Pain Onset More than a month ago    Pain Frequency Intermittent    Aggravating Factors  driving makes it worse, not as sore after most recent pool session    Pain Relieving Factors stretching, rolling pin              11/07/20 0947  Transfers  Transfers Sit to Stand;Stand to Sit  Sit to Stand 5: Supervision;With upper extremity assist;Without upper extremity assist;From bed;From chair/3-in-1  Stand to Sit 5: Supervision;With upper extremity assist;Without upper extremity  assist;To bed;To chair/3-in-1  Ambulation/Gait  Ambulation/Gait Yes  Ambulation/Gait Assistance 5: Supervision  Ambulation/Gait Assistance Details working on heel>toe step progression on bil LEs  Ambulation Distance (Feet)  (around clinic with session)  Assistive device None  Gait Pattern Step-through pattern;Decreased step length - right;Decreased step length - left;Decreased dorsiflexion - right;Decreased dorsiflexion - left;Right flexed knee in stance;Left flexed knee in stance;Decreased trunk rotation;Narrow base of support  Ambulation Surface Level;Indoor  Knee/Hip Exercises: Futures trader Both;3 reps;30 seconds;Limitations  Active Hamstring Stretch Limitations seated with heel propped on 4 inch box, PTA providing gentle overpressure at knee to encourage maintaining knee extension as pt leaned forward at hips for stretch.  Knee/Hip Exercises: Aerobic  Nustep Level 3, 4 extremities, x 8 minutes for flexibility, strengthening of lower extremities; pt keeps steps/min > 45-50  Manual Therapy  Manual Therapy Other (comment)  Manual therapy comments seated with foot propped on stool- use of while foam roll on bil calves for 3-4 minutes each with light to medium pressure for decreased muscle tightness.          Balance Exercises - 11/07/20 1000       Balance Exercises: Standing   Rockerboard Anterior/posterior;Lateral;EO;30 seconds;Other reps (comment);Limitations    Rockerboard Limitations performed both ways on balance board with UE support, min guard for balance: rocking the board with emphasis on tall posture with EO, progressing to EC,  Cues on posture and weight shifting to assist with balance on board; then holding the board steady for EC 30 sec's x 3 reps. min guard to min assist with cues on posture/weight shifting. occasional touch to bars for balance assitance.    Balance Beam standing across red foam beam with occasional touch to bars: alternating foward  stepping to floor/back onto beam for 10 rep each side. min guard to min assist for balance.                  PT Short Term Goals - 11/06/20 1257       PT SHORT TERM GOAL #1   Title Pt will be independent with progression of HEP for decreased pain and improved functional strength, transfers, balance,  and gait.  TARGET 10/31/2020    Baseline 10/31/20: met with current program, will benefit from advancement as pt progresses    Status Achieved      PT SHORT TERM GOAL #2   Title Pt will improve 5x sit<>stand to less than or equal to 18 seconds for improved  functional lower extremity strength.    Baseline 10/31/20: 15.88 sec's no UE support from standard height surface    Status Achieved      PT SHORT TERM GOAL #3   Title Pt will improve posterior push and release test to 2 steps or less for improved balance recovery.    Baseline 10/31/20: met in session today    Status Achieved      PT SHORT TERM GOAL #4   Baseline 7/1               PT Long Term Goals - 11/06/20 1257       PT LONG TERM GOAL #1   Title Pt will verbalize plans for continued community fitness/HEP to maximize gains made in PT.  TARGET 11/28/2020    Time 8    Period Weeks    Status New      PT LONG TERM GOAL #2   Title Pt will improve 5x sit<>stand to less than or equal to 15 seconds for improved functional strength.    Baseline 21.09 sec 10/09/2020    Time 8    Period Weeks    Status On-going      PT LONG TERM GOAL #3   Title Pt will improve MiniBESTest to at least 20/28 for decreased fall risk.    Baseline 17/28 10/09/20    Time 8    Period Weeks    Status New      PT LONG TERM GOAL #4   Title Pt will imrpove TUG score and TUG cog score to less than or equal to 10% difference for improved dual tasking with gait.    Baseline 16.9, 23.56; TUG 13 sec, TUG cogn 15 seconds 10/09/20    Time 8    Period Weeks    Status On-going      PT LONG TERM GOAL #5   Title Pt will ambulate at least 20 minutes with  appropriate standing rest breaks, without c/o increased pain or fatigue for improved gait indpedence in her neighborhood.    Baseline 10 minutes before having to stop; 10/09/20:  reports 15 minutes of walking in neighborhood, " a little pain", but I can handle it.    Time 8    Period Weeks    Status Revised              11/07/20 0946  Plan  Clinical Impression Statement Today's skilled session continued to focus on strengthening, stretching and balance training with decreased pain reported at end of session. The pt is progressing toward goals and should benefit from continued PT to progress toward unmet goals.  Personal Factors and Comorbidities Comorbidity 3+  Comorbidities asthma, GERD, breast cancer, HTN, cevical spondylosis, lumbar radiculopathy, OA of bilat knees, hx of donating kidney to husband  Examination-Activity Limitations Locomotion Level;Transfers;Stand  Examination-Participation Restrictions Church;Community Activity;Meal Prep  Pt will benefit from skilled therapeutic intervention in order to improve on the following deficits Abnormal gait;Decreased range of motion;Pain;Decreased balance;Impaired flexibility;Decreased mobility;Decreased strength;Postural dysfunction  PT Frequency 2x / week  PT Duration 8 weeks (per recert, including 0/34/7425 week)  PT Treatment/Interventions ADLs/Self Care Home Management;Aquatic Therapy;DME Instruction;Neuromuscular re-education;Balance training;Therapeutic exercise;Therapeutic activities;Functional mobility training;Gait training;Patient/family education;Manual techniques;Passive range of motion  PT Next Visit Plan Continue gastroc and hamstring stretch with strengthening of hips/gluts/quads, ankle musculature, and work on standing static balance on compliant surface as well as dynamic balance (hip/ankle/step strategy) and gait activiteis.  Step strategy varied directions;  PT Home Exercise Plan Access Code:  [email or web address]  Consulted and Agree  with Plan of Care Patient       Patient will benefit from skilled therapeutic intervention in order to improve the following deficits and impairments:  Abnormal gait, Decreased range of motion, Pain, Decreased balance, Impaired flexibility, Decreased mobility, Decreased strength, Postural dysfunction  Visit Diagnosis: Muscle weakness (generalized)  Unsteadiness on feet  Other abnormalities of gait and mobility  Abnormal posture  Pain in right lower leg  Pain in left lower leg  Other symptoms and signs involving the nervous system     Problem List Patient Active Problem List   Diagnosis Date Noted   Primary osteoarthritis of right knee 05/12/2020   Primary osteoarthritis of left knee 05/12/2020   Hyperlipidemia    Bilateral calf pain 03/05/2019   Genetic testing 02/13/2019   Family history of ovarian cancer    Family history of bladder cancer    Family history of colon cancer    Family history of leukemia    Lumbar radiculopathy 12/08/2018   Myofascial pain 12/08/2018   Impaired gait and mobility 12/08/2018   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   Chronic nonallergic rhinitis 12/29/2016   Mild persistent asthma, uncomplicated 61/84/8592   Vocal fold paralysis, 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

## 2020-11-10 ENCOUNTER — Encounter: Payer: Self-pay | Admitting: Physical Therapy

## 2020-11-10 ENCOUNTER — Other Ambulatory Visit: Payer: Self-pay

## 2020-11-10 ENCOUNTER — Other Ambulatory Visit: Payer: Self-pay | Admitting: Family Medicine

## 2020-11-10 ENCOUNTER — Ambulatory Visit: Payer: Medicare Other | Admitting: Physical Therapy

## 2020-11-10 DIAGNOSIS — R293 Abnormal posture: Secondary | ICD-10-CM

## 2020-11-10 DIAGNOSIS — M6281 Muscle weakness (generalized): Secondary | ICD-10-CM

## 2020-11-10 DIAGNOSIS — M79662 Pain in left lower leg: Secondary | ICD-10-CM | POA: Diagnosis not present

## 2020-11-10 DIAGNOSIS — M79661 Pain in right lower leg: Secondary | ICD-10-CM

## 2020-11-10 DIAGNOSIS — R2681 Unsteadiness on feet: Secondary | ICD-10-CM

## 2020-11-10 DIAGNOSIS — R2689 Other abnormalities of gait and mobility: Secondary | ICD-10-CM

## 2020-11-10 NOTE — Therapy (Addendum)
[facility] [address] [place], [place], [address] Phone: [phone]   Fax:  [phone]  Physical Therapy Treatment  Patient Details  Name: April Church MRN: [id number] Date of Birth: 09-Dec-1940 Referring Provider (PT): Dr. Courtney Heys   Encounter Date: 11/10/2020   PT End of Session - 11/10/20 1736     Visit Number 19    Number of Visits 26    Date for PT Re-Evaluation 11/28/20    Authorization Type Medicare    Progress Note Due on Visit 20    PT Start Time 1320    PT Stop Time 1401    PT Time Calculation (min) 41 min    Equipment Utilized During Treatment --   aquatic bar bells, long bar bell, aquatic step   Activity Tolerance Patient tolerated treatment well;No increased pain   pain lessened to 3/10 at end of session   Behavior During Therapy [facility] for tasks assessed/performed             Past Medical History:  Diagnosis Date   Asthma    daily inhaler   Breast cancer ([place])    left- radiation and surgery -dx. 2011- no further tx. now- [name] , [name]   Cyst of finger 11/2011   annular cyst right long finger   Dental crowns present    Dermatitis    Family history of bladder cancer    Family history of colon cancer    Family history of leukemia    Family history of ovarian cancer    Frequency of urination    GERD (gastroesophageal reflux disease)    Hemorrhoid    Hyperlipidemia    Hypertension    under control, has been on med. x 2 yrs.   Parkinson's disease ([place])    PONV (postoperative nausea and vomiting)    Seasonal allergies    Tremors of nervous system    hands   Trigger finger of right hand 11/2011   long finger    Past Surgical History:  Procedure Laterality Date   ANTERIOR CERVICAL DECOMPRESSION/DISCECTOMY FUSION 4 LEVELS N/A 11/14/2013   Procedure: ANTERIOR CERVICAL DECOMPRESSION/DISCECTOMY FUSION 4 LEVELS;  Surgeon: Newman Pies, MD;  Location: [facility];   Service: Neurosurgery;  Laterality: N/A;  C34 C45 C56 C67 anterior cervical fusion with interbody prosthesis plating and  bonegraft   APPENDECTOMY  age 53   BREAST LUMPECTOMY  06/16/2009   left; SLN bx.   BREAST LUMPECTOMY  07/01/2009   re-excision   BREAST SURGERY  1999   reduction   CATARACT EXTRACTION, BILATERAL     COLONOSCOPY WITH PROPOFOL N/A 12/03/2014   Procedure: COLONOSCOPY WITH PROPOFOL;  Surgeon: [name], MD;  Location: [facility];  Service: Endoscopy;  Laterality: N/A;   FOOT SURGERY  06/22/11   left   KNEE ARTHROSCOPY  03/12/2005   right   KNEE ARTHROSCOPY     left   NASAL SINUS SURGERY     x 2   NEPHRECTOMY LIVING DONOR Left 04/2008   donated to spouse 2010([facility])   TRIGGER FINGER RELEASE  12/16/2011   Procedure: RELEASE TRIGGER FINGER/A-1 PULLEY;  Surgeon: Tennis Must, MD;  Location: [facility];  Service: Orthopedics;  Laterality: Right;  RIGHT LONG FINGER TRIGGER RELEASE & ANNULAR CYST EXCISION   TUMOR EXCISION  age 88   right arm    There were no vitals filed for this visit.   Subjective Assessment - 11/10/20 1731  Subjective No new complaints. No falls. Reports legs mostly calves, are sore today.    Limitations Walking;Standing    How long can you stand comfortably? < 5 minutes    How long can you walk comfortably? 10 minutes or less    Patient Stated Goals Pt's goals for therapy are to get rid of pain, and improve strength.    Currently in Pain? Yes    Pain Score 3     Pain Location Calf    Pain Orientation Right;Left   right>left   Pain Type Chronic pain    Pain Onset More than a month ago    Pain Frequency Intermittent    Aggravating Factors  driving, unsure why more sore today    Pain Relieving Factors stretching, rolling pin            Aquatic therapy at [facility] - pool temp 94 degrees   Patient seen for aquatic therapy today.  Treatment took place in water 3.5-4.5 feet deep depending upon activity.  Pt entered  and  Exited the pool via step negotiation with use of bil. Hands rails, step by step pattern used to enter/exit pool with pool shoes donned.   Pt made her way over to bench in water. Sitting in long sitting with back against wall bil hamstring and heel cord stretching performed by PTA. Gentle overpressure at knee concurrent with heel cord stretch. 3 reps for 60 seconds each side. Then seated at edge of bench with on foot on floor, other propped on PTA for use of foam roll rolling on calf for 2-3 minutes each side for decreased pain/tightness. Then standing at wall pt performed runner stretch with cues on technique/posture for 30 sec holds x 3 reps each side.   For strengthening: with use of aquatic step in ~4' water depth- with UE support on wall for forward step ups, with contralateral march then lateral step ups with contralateral march for 10 reps each, bil sides. Cues on posture and ex form needed.   Gait across pool in 4' water depth with long bar bell- forward/backward walking with emphasis on heel strike with forward gait, then lateral stepping left<>right for 4 laps each/each way. Min assist at times for balance recovery in water. Cues on tall posture.   For static balance training in ~4' water depth: pt performed Ai Chi Accepting pose with max cues needed for sequencing and technique, min assist for balance at times. Pt then performed Ai Chi Balance with cues needed on form/technique. Pt tends to move same side vs contralateral sides despite max multi modal cues. Up to min assist at times for balance. Pt performed 6-7 reps each with each side.   Pt requires buoyancy of water for support for reduced fall risk with gait training and balance exercises with minimal UE support; exercises able to be performed safely in water without the risk of fall compared to those same exercises performed on land;  viscosity of water needed for resistance for strengthening.  Current of water provides perturbations  for challenging static & dynamic standing balance.            PT Short Term Goals - 11/06/20 1257       PT SHORT TERM GOAL #1   Title Pt will be independent with progression of HEP for decreased pain and improved functional strength, transfers, balance,  and gait.  TARGET 10/31/2020    Baseline 10/31/20: met with current program, will benefit from advancement as pt progresses  Status Achieved      PT SHORT TERM GOAL #2   Title Pt will improve 5x sit<>stand to less than or equal to 18 seconds for improved functional lower extremity strength.    Baseline 10/31/20: 15.88 sec's no UE support from standard height surface    Status Achieved      PT SHORT TERM GOAL #3   Title Pt will improve posterior push and release test to 2 steps or less for improved balance recovery.    Baseline 10/31/20: met in session today    Status Achieved      PT SHORT TERM GOAL #4   Baseline 7/1               PT Long Term Goals - 11/06/20 1257       PT LONG TERM GOAL #1   Title Pt will verbalize plans for continued community fitness/HEP to maximize gains made in PT.  TARGET 11/28/2020    Time 8    Period Weeks    Status New      PT LONG TERM GOAL #2   Title Pt will improve 5x sit<>stand to less than or equal to 15 seconds for improved functional strength.    Baseline 21.09 sec 10/09/2020    Time 8    Period Weeks    Status On-going      PT LONG TERM GOAL #3   Title Pt will improve MiniBESTest to at least 20/28 for decreased fall risk.    Baseline 17/28 10/09/20    Time 8    Period Weeks    Status New      PT LONG TERM GOAL #4   Title Pt will imrpove TUG score and TUG cog score to less than or equal to 10% difference for improved dual tasking with gait.    Baseline 16.9, 23.56; TUG 13 sec, TUG cogn 15 seconds 10/09/20    Time 8    Period Weeks    Status On-going      PT LONG TERM GOAL #5   Title Pt will ambulate at least 20 minutes with appropriate standing rest breaks, without c/o  increased pain or fatigue for improved gait indpedence in her neighborhood.    Baseline 10 minutes before having to stop; 10/09/20:  reports 15 minutes of walking in neighborhood, " a little pain", but I can handle it.    Time 8    Period Weeks    Status Revised                   Plan - 11/10/20 1737     Clinical Impression Statement Today's skilled session continued to focus on stretching, strenthening, balance and gait in an Aquatic session. Up to min assist needed at times for balance with activiites performed in water. No issues noted or reported by pt with session. The pt is making progress toward goals and should benefit from continued PT to progress toward unmet goals.    Personal Factors and Comorbidities Comorbidity 3+    Comorbidities asthma, GERD, breast cancer, HTN, cevical spondylosis, lumbar radiculopathy, OA of bilat knees, hx of donating kidney to husband    Examination-Activity Limitations Locomotion Level;Transfers;Stand    Examination-Participation Restrictions Church;Community Activity;Meal Prep    PT Frequency 2x / week    PT Duration 8 weeks   per recert, including 06/24/8880 week   PT Treatment/Interventions ADLs/Self Care Home Management;Aquatic Therapy;DME Instruction;Neuromuscular re-education;Balance training;Therapeutic exercise;Therapeutic activities;Functional mobility training;Gait training;Patient/family education;Manual techniques;Passive range of motion  PT Next Visit Plan Continue gastroc and hamstring stretch with strengthening of hips/gluts/quads, ankle musculature, and work on standing static balance on compliant surface as well as dynamic balance (hip/ankle/step strategy) and gait activiteis.  Step strategy varied directions;    PT Home Exercise Plan Access Code: [email or web address]    Consulted and Agree with Plan of Care Patient             Patient will benefit from skilled therapeutic intervention in order to improve the following deficits and  impairments:  Abnormal gait, Decreased range of motion, Pain, Decreased balance, Impaired flexibility, Decreased mobility, Decreased strength, Postural dysfunction  Visit Diagnosis: Muscle weakness (generalized)  Unsteadiness on feet  Other abnormalities of gait and mobility  Abnormal posture  Pain in right lower leg  Pain in left lower leg     Problem List Patient Active Problem List   Diagnosis Date Noted   Primary osteoarthritis of right knee 05/12/2020   Primary osteoarthritis of left knee 05/12/2020   Hyperlipidemia    Bilateral calf pain 03/05/2019   Genetic testing 02/13/2019   Family history of ovarian cancer    Family history of bladder cancer    Family history of colon cancer    Family history of leukemia    Lumbar radiculopathy 12/08/2018   Myofascial pain 12/08/2018   Impaired gait and mobility 12/08/2018   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   Chronic nonallergic rhinitis 12/29/2016   Mild persistent asthma, uncomplicated 60/73/7106   Vocal fold paralysis, 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   Gastroesophageal reflux disease 12/29/2016   Cervical spondylosis with myelopathy and radiculopathy 11/14/2013   Essential tremor 06/13/2013   Cervical spondylosis without myelopathy 06/13/2013   Breast cancer of lower-outer quadrant of left female breast ([place]) 09/10/2010   Willow Ora, PTA, [facility] [address] [place], [place] [address] [phone] 11/10/20, 5:46 PM   Name: April Church MRN: [id number] Date of Birth: 25-Apr-1940

## 2020-11-14 ENCOUNTER — Ambulatory Visit: Payer: Medicare Other | Admitting: Physical Therapy

## 2020-11-14 ENCOUNTER — Other Ambulatory Visit: Payer: Self-pay

## 2020-11-14 ENCOUNTER — Encounter: Payer: Self-pay | Admitting: Physical Therapy

## 2020-11-14 DIAGNOSIS — M79661 Pain in right lower leg: Secondary | ICD-10-CM | POA: Diagnosis not present

## 2020-11-14 DIAGNOSIS — M6281 Muscle weakness (generalized): Secondary | ICD-10-CM

## 2020-11-14 DIAGNOSIS — R2689 Other abnormalities of gait and mobility: Secondary | ICD-10-CM

## 2020-11-14 DIAGNOSIS — R2681 Unsteadiness on feet: Secondary | ICD-10-CM

## 2020-11-14 DIAGNOSIS — M79662 Pain in left lower leg: Secondary | ICD-10-CM | POA: Diagnosis not present

## 2020-11-14 DIAGNOSIS — R293 Abnormal posture: Secondary | ICD-10-CM | POA: Diagnosis not present

## 2020-11-14 NOTE — Therapy (Signed)
[facility] [address] [place], [place], [address] Phone: [phone]   Fax:  [phone]  Physical Therapy Treatment/10th Visit Progress Note  Patient Details  Name: April Church MRN: [id number] Date of Birth: 07/30/1940 Referring Provider (PT): Dr. Courtney Heys   Encounter Date: 11/14/2020   PT End of Session - 11/14/20 1417     Visit Number 20    Number of Visits 26    Date for PT Re-Evaluation 11/28/20    Authorization Type Medicare    Progress Note Due on Visit 20    PT Start Time 0935    PT Stop Time 1019    PT Time Calculation (min) 44 min    Equipment Utilized During Treatment Gait belt    Activity Tolerance Patient tolerated treatment well;No increased pain    Behavior During Therapy WFL for tasks assessed/performed             Past Medical History:  Diagnosis Date   Asthma    daily inhaler   Breast cancer ([place])    left- radiation and surgery -dx. 2011- no further tx. now- [name] , [name]   Cyst of finger 11/2011   annular cyst right long finger   Dental crowns present    Dermatitis    Family history of bladder cancer    Family history of colon cancer    Family history of leukemia    Family history of ovarian cancer    Frequency of urination    GERD (gastroesophageal reflux disease)    Hemorrhoid    Hyperlipidemia    Hypertension    under control, has been on med. x 2 yrs.   Parkinson's disease ([place])    PONV (postoperative nausea and vomiting)    Seasonal allergies    Tremors of nervous system    hands   Trigger finger of right hand 11/2011   long finger    Past Surgical History:  Procedure Laterality Date   ANTERIOR CERVICAL DECOMPRESSION/DISCECTOMY FUSION 4 LEVELS N/A 11/14/2013   Procedure: ANTERIOR CERVICAL DECOMPRESSION/DISCECTOMY FUSION 4 LEVELS;  Surgeon: Newman Pies, MD;  Location: [facility];  Service: Neurosurgery;  Laterality: N/A;  C34 C45 C56 C67  anterior cervical fusion with interbody prosthesis plating and  bonegraft   APPENDECTOMY  age 22   BREAST LUMPECTOMY  06/16/2009   left; SLN bx.   BREAST LUMPECTOMY  07/01/2009   re-excision   BREAST SURGERY  1999   reduction   CATARACT EXTRACTION, BILATERAL     COLONOSCOPY WITH PROPOFOL N/A 12/03/2014   Procedure: COLONOSCOPY WITH PROPOFOL;  Surgeon: [name], MD;  Location: [facility];  Service: Endoscopy;  Laterality: N/A;   FOOT SURGERY  06/22/11   left   KNEE ARTHROSCOPY  03/12/2005   right   KNEE ARTHROSCOPY     left   NASAL SINUS SURGERY     x 2   NEPHRECTOMY LIVING DONOR Left 04/2008   donated to spouse 2010([facility])   TRIGGER FINGER RELEASE  12/16/2011   Procedure: RELEASE TRIGGER FINGER/A-1 PULLEY;  Surgeon: Tennis Must, MD;  Location: [facility];  Service: Orthopedics;  Laterality: Right;  RIGHT LONG FINGER TRIGGER RELEASE & ANNULAR CYST EXCISION   TUMOR EXCISION  age 65   right arm    There were no vitals filed for this visit.   Subjective Assessment - 11/14/20 0942     Subjective I don't know what happened, but I was okay  directly after the pool, but then the next day, my R knee and leg hurts more.  Pt does report walking up to about 20 minutes prior to fatigue and needing to stop.    Limitations Walking;Standing    How long can you stand comfortably? < 5 minutes    How long can you walk comfortably? 10 minutes or less    Patient Stated Goals Pt's goals for therapy are to get rid of pain, and improve strength.    Currently in Pain? Yes    Pain Score 4    RLE only, no pain in LLE.   Pain Location Calf    Pain Orientation Right    Pain Descriptors / Indicators Aching;Sore    Pain Onset More than a month ago    Pain Frequency Intermittent    Aggravating Factors  unsure, but pain increases at times    Pain Relieving Factors stretching, pain medication                               [facility] Adult PT Treatment/Exercise -  11/14/20 0001       Ambulation/Gait   Ambulation/Gait Yes    Ambulation/Gait Assistance 5: Supervision    Ambulation/Gait Assistance Details Cues and focus on heel>toe step pattern BLEs with gait.    Ambulation Distance (Feet) 80 Feet   x 2, 120 ft x 2   Assistive device None    Gait Pattern Step-through pattern;Decreased step length - right;Decreased step length - left;Decreased dorsiflexion - right;Decreased dorsiflexion - left;Right flexed knee in stance;Left flexed knee in stance;Decreased trunk rotation;Narrow base of support    Ambulation Surface Level;Indoor      Self-Care   Self-Care Other Self-Care Comments    Other Self-Care Comments  Discussed pain in calves, trying to answer pt's questions regarding pain in relation to Parkinson's disease.  Discussed pain associated with Parkinson's typically may come from stiffness/rigidity, but pt's pain may be also coming from her posture, positioning, tightness through BLEs.  She tends to keep knees flexed and doesn't have as much plantarflexion with gait pattern, making her calves constantly "fire".  Discussed POC and scheduling additional PT visit in pool and clinic (to complete POC, ending 11/28/20).  Discussed potential use of [facility] pool after discharge from PT.      Knee/Hip Exercises: Stretches   Other Knee/Hip Stretches STanding wide BOS anterior/posterior rocking through hips for hamstring/gastroc stretch.  Performed 5 reps, 10 sec      Knee/Hip Exercises: Aerobic   Nustep Level 3, 4 extremities, x 8 minutes for flexibility, strengthening of lower extremities; pt keeps steps/min >50            Discussed upcoming screens and likely needing to cancel PT and speech screens (due to therapists transitioning another clinic and no other therapists built yet for screens).       Balance Exercises - 11/14/20 0001       Balance Exercises: Standing   SLS Eyes open;Upper extremity support 2;3 reps;10 secs    Rockerboard  Anterior/posterior;EO;UE support;Limitations    Rockerboard Limitations Standing tall on balance board with use of ankle strategy ant/posterior x 10 reps, then hip strategy x 10 reps.  Standing steady and tall on board with cues for knees extended with alt UE lifts x 5 reps.  Forward step taps to ground, then return to midline on board, x 10 reps.    Tandem Gait Forward;Retro;Intermittent upper  extremity support;2 reps;Limitations    Tandem Gait Limitations Focus on heel>toe/toe>heel foot placement throughout    Toe Raise Both;10 reps   solid surface, 3 sec hold; then additional 5 reps 3 second hold no UE support                 PT Short Term Goals - 11/06/20 1257       PT SHORT TERM GOAL #1   Title Pt will be independent with progression of HEP for decreased pain and improved functional strength, transfers, balance,  and gait.  TARGET 10/31/2020    Baseline 10/31/20: met with current program, will benefit from advancement as pt progresses    Status Achieved      PT SHORT TERM GOAL #2   Title Pt will improve 5x sit<>stand to less than or equal to 18 seconds for improved functional lower extremity strength.    Baseline 10/31/20: 15.88 sec's no UE support from standard height surface    Status Achieved      PT SHORT TERM GOAL #3   Title Pt will improve posterior push and release test to 2 steps or less for improved balance recovery.    Baseline 10/31/20: met in session today    Status Achieved      PT SHORT TERM GOAL #4   Baseline 7/1               PT Long Term Goals - 11/06/20 1257       PT LONG TERM GOAL #1   Title Pt will verbalize plans for continued community fitness/HEP to maximize gains made in PT.  TARGET 11/28/2020    Time 8    Period Weeks    Status New      PT LONG TERM GOAL #2   Title Pt will improve 5x sit<>stand to less than or equal to 15 seconds for improved functional strength.    Baseline 21.09 sec 10/09/2020    Time 8    Period Weeks    Status  On-going      PT LONG TERM GOAL #3   Title Pt will improve MiniBESTest to at least 20/28 for decreased fall risk.    Baseline 17/28 10/09/20    Time 8    Period Weeks    Status New      PT LONG TERM GOAL #4   Title Pt will imrpove TUG score and TUG cog score to less than or equal to 10% difference for improved dual tasking with gait.    Baseline 16.9, 23.56; TUG 13 sec, TUG cogn 15 seconds 10/09/20    Time 8    Period Weeks    Status On-going      PT LONG TERM GOAL #5   Title Pt will ambulate at least 20 minutes with appropriate standing rest breaks, without c/o increased pain or fatigue for improved gait indpedence in her neighborhood.    Baseline 10 minutes before having to stop; 10/09/20:  reports 15 minutes of walking in neighborhood, " a little pain", but I can handle it.    Time 8    Period Weeks    Status Revised                   Plan - 11/14/20 1417     Clinical Impression Statement 10th Visit Progress note (this is visit 20), covering visits from 09/11/2020-11/14/2020.  Pt was on hold for part of this time per MD request due to having heart monitor.  Pt has begun aquatic therapy and is participating in this once per week and coming into clinic once per week.  Measures recently taken indicate improved functional strength with 5x sit<>stand 15.88 sec, improved from 21.09 sec.  Pt is able to complete push and release test in 2 steps or less to recover balance.  She does tend to have postural abnormality with flexed knees and dorsflexed position with gait with decreased pushoff with gait.  This posioitnoing of her lower legs consistently may be contribuiting to some of her pain.  She appears to be overall taking longer stride lengths wtih gait.  She is progressing towards LTGs and will continue to beneift from skilled PT to further address strength, flexibility, balance, and gait.    Personal Factors and Comorbidities Comorbidity 3+    Comorbidities asthma, GERD, breast cancer,  HTN, cevical spondylosis, lumbar radiculopathy, OA of bilat knees, hx of donating kidney to husband    Examination-Activity Limitations Locomotion Level;Transfers;Stand    Examination-Participation Restrictions Church;Community Activity;Meal Prep    PT Frequency 2x / week    PT Duration 8 weeks   per recert, including 5/52/0802 week   PT Treatment/Interventions ADLs/Self Care Home Management;Aquatic Therapy;DME Instruction;Neuromuscular re-education;Balance training;Therapeutic exercise;Therapeutic activities;Functional mobility training;Gait training;Patient/family education;Manual techniques;Passive range of motion    PT Next Visit Plan Continue gastroc and hamstring stretch with strengthening of hips/gluts/quads, ankle musculature, and work on standing static balance on compliant surface as well as dynamic balance (hip/ankle/step strategy) and gait activiteis.  Step strategy varied directions;  Please make sure pt gets scheduled for pool 11/24/20 and can discuss options for continued pool exercise at [facility].  Work towards [employer] and anitipcate d/c 11/28/20.    PT Home Exercise Plan Access Code: [email or web address]    Consulted and Agree with Plan of Care Patient             Patient will benefit from skilled therapeutic intervention in order to improve the following deficits and impairments:  Abnormal gait, Decreased range of motion, Pain, Decreased balance, Impaired flexibility, Decreased mobility, Decreased strength, Postural dysfunction  Visit Diagnosis: Muscle weakness (generalized)  Unsteadiness on feet  Other abnormalities of gait and mobility  Abnormal posture     Problem List Patient Active Problem List   Diagnosis Date Noted   Primary osteoarthritis of right knee 05/12/2020   Primary osteoarthritis of left knee 05/12/2020   Hyperlipidemia    Bilateral calf pain 03/05/2019   Genetic testing 02/13/2019   Family history of ovarian cancer    Family history of bladder cancer     Family history of colon cancer    Family history of leukemia    Lumbar radiculopathy 12/08/2018   Myofascial pain 12/08/2018   Impaired gait and mobility 12/08/2018   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   Chronic nonallergic rhinitis 12/29/2016   Mild persistent asthma, uncomplicated 23/36/1224   Vocal fold paralysis, 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   Gastroesophageal reflux disease 12/29/2016   Cervical spondylosis with myelopathy and radiculopathy 11/14/2013   Essential tremor 06/13/2013   Cervical spondylosis without myelopathy 06/13/2013   Breast cancer of lower-outer quadrant of left female breast ([place]) 09/10/2010    Frazier Butt., PT 11/14/2020, 2:28 PM  [facility] [address] [place], [place], [address] Phone: [phone]   Fax:  [phone]  Name: RICKELLE SYLVESTRE MRN: [id number] Date of Birth: [birth year]

## 2020-11-17 ENCOUNTER — Ambulatory Visit: Payer: Medicare Other | Admitting: Physical Therapy

## 2020-11-17 ENCOUNTER — Other Ambulatory Visit: Payer: Self-pay

## 2020-11-17 ENCOUNTER — Encounter: Payer: Self-pay | Admitting: Physical Therapy

## 2020-11-17 DIAGNOSIS — M79662 Pain in left lower leg: Secondary | ICD-10-CM | POA: Diagnosis not present

## 2020-11-17 DIAGNOSIS — M6281 Muscle weakness (generalized): Secondary | ICD-10-CM | POA: Diagnosis not present

## 2020-11-17 DIAGNOSIS — M79661 Pain in right lower leg: Secondary | ICD-10-CM

## 2020-11-17 DIAGNOSIS — R293 Abnormal posture: Secondary | ICD-10-CM

## 2020-11-17 DIAGNOSIS — R2689 Other abnormalities of gait and mobility: Secondary | ICD-10-CM | POA: Diagnosis not present

## 2020-11-17 DIAGNOSIS — R2681 Unsteadiness on feet: Secondary | ICD-10-CM | POA: Diagnosis not present

## 2020-11-17 NOTE — Therapy (Signed)
[facility] [address] [place], [place], [address] Phone: [phone]   Fax:  [phone]  Physical Therapy Treatment  Patient Details  Name: April Church MRN: [id number] Date of Birth: [birth year] Referring Provider (PT): Dr. Courtney Heys   Encounter Date: 11/17/2020   PT End of Session - 11/17/20 2328     Visit Number 21    Number of Visits 26    Date for PT Re-Evaluation 11/28/20    Authorization Type Medicare    Progress Note Due on Visit 30    PT Start Time 1316    PT Stop Time 1357    PT Time Calculation (min) 41 min    Equipment Utilized During Treatment Other (comment)   foam noodles, hand bar bells,   Activity Tolerance Patient tolerated treatment well;No increased pain    Behavior During Therapy WFL for tasks assessed/performed             Past Medical History:  Diagnosis Date   Asthma    daily inhaler   Breast cancer ([place])    left- radiation and surgery -dx. 2011- no further tx. now- [name] , [name]   Cyst of finger 11/2011   annular cyst right long finger   Dental crowns present    Dermatitis    Family history of bladder cancer    Family history of colon cancer    Family history of leukemia    Family history of ovarian cancer    Frequency of urination    GERD (gastroesophageal reflux disease)    Hemorrhoid    Hyperlipidemia    Hypertension    under control, has been on med. x 2 yrs.   Parkinson's disease ([place])    PONV (postoperative nausea and vomiting)    Seasonal allergies    Tremors of nervous system    hands   Trigger finger of right hand 11/2011   long finger    Past Surgical History:  Procedure Laterality Date   ANTERIOR CERVICAL DECOMPRESSION/DISCECTOMY FUSION 4 LEVELS N/A 11/14/2013   Procedure: ANTERIOR CERVICAL DECOMPRESSION/DISCECTOMY FUSION 4 LEVELS;  Surgeon: Newman Pies, MD;  Location: [facility];  Service: Neurosurgery;  Laterality: N/A;  C34  C45 C56 C67 anterior cervical fusion with interbody prosthesis plating and  bonegraft   APPENDECTOMY  age 56   BREAST LUMPECTOMY  06/16/2009   left; SLN bx.   BREAST LUMPECTOMY  07/01/2009   re-excision   BREAST SURGERY  1999   reduction   CATARACT EXTRACTION, BILATERAL     COLONOSCOPY WITH PROPOFOL N/A 12/03/2014   Procedure: COLONOSCOPY WITH PROPOFOL;  Surgeon: [name], MD;  [facility];  Service: Endoscopy;  Laterality: N/A;   FOOT SURGERY  06/22/11   left   KNEE ARTHROSCOPY  03/12/2005   right   KNEE ARTHROSCOPY     left   NASAL SINUS SURGERY     x 2   NEPHRECTOMY LIVING DONOR Left 04/2008   donated to spouse 2010([facility])   TRIGGER FINGER RELEASE  12/16/2011   Procedure: RELEASE TRIGGER FINGER/A-1 PULLEY;  Surgeon: Tennis Must, MD;  Location: [facility];  Service: Orthopedics;  Laterality: Right;  RIGHT LONG FINGER TRIGGER RELEASE & ANNULAR CYST EXCISION   TUMOR EXCISION  age 89   right arm    There were no vitals filed for this visit.   Subjective Assessment - 11/17/20 2327     Subjective No new complaints. No falls to  report. LE's are tight today.    Limitations Walking;Standing    How long can you stand comfortably? < 5 minutes    How long can you walk comfortably? 10 minutes or less    Patient Stated Goals Pt's goals for therapy are to get rid of pain, and improve strength.    Currently in Pain? No/denies    Pain Score 0-No pain              Aquatic therapy at [facility] - pool temp 94 degrees   Patient seen for aquatic therapy today.  Treatment took place in water 3.5-4.5 feet deep depending upon activity.  Pt entered and  Exited the pool via step negotiation with use of bil. Hands rails for step to pattern both ways, min guard assist.     Pt requires buoyancy of water for support for reduced fall risk with gait training and balance exercises with minimal UE support; exercises able to be performed safely in water without  the risk of fall compared to those same exercises performed on land;  viscosity of water needed for resistance for strengthening.  Current of water provides perturbations for challenging static & dynamic standing balance.                               PT Short Term Goals - 11/06/20 1257       PT SHORT TERM GOAL #1   Title Pt will be independent with progression of HEP for decreased pain and improved functional strength, transfers, balance,  and gait.  TARGET 10/31/2020    Baseline 10/31/20: met with current program, will benefit from advancement as pt progresses    Status Achieved      PT SHORT TERM GOAL #2   Title Pt will improve 5x sit<>stand to less than or equal to 18 seconds for improved functional lower extremity strength.    Baseline 10/31/20: 15.88 sec's no UE support from standard height surface    Status Achieved      PT SHORT TERM GOAL #3   Title Pt will improve posterior push and release test to 2 steps or less for improved balance recovery.    Baseline 10/31/20: met in session today    Status Achieved      PT SHORT TERM GOAL #4   Baseline 7/1               PT Long Term Goals - 11/06/20 1257       PT LONG TERM GOAL #1   Title Pt will verbalize plans for continued community fitness/HEP to maximize gains made in PT.  TARGET 11/28/2020    Time 8    Period Weeks    Status New      PT LONG TERM GOAL #2   Title Pt will improve 5x sit<>stand to less than or equal to 15 seconds for improved functional strength.    Baseline 21.09 sec 10/09/2020    Time 8    Period Weeks    Status On-going      PT LONG TERM GOAL #3   Title Pt will improve MiniBESTest to at least 20/28 for decreased fall risk.    Baseline 17/28 10/09/20    Time 8    Period Weeks    Status New      PT LONG TERM GOAL #4   Title Pt will imrpove TUG score and TUG cog score to less than or  equal to 10% difference for improved dual tasking with gait.    Baseline 16.9, 23.56; TUG 13  sec, TUG cogn 15 seconds 10/09/20    Time 8    Period Weeks    Status On-going      PT LONG TERM GOAL #5   Title Pt will ambulate at least 20 minutes with appropriate standing rest breaks, without c/o increased pain or fatigue for improved gait indpedence in her neighborhood.    Baseline 10 minutes before having to stop; 10/09/20:  reports 15 minutes of walking in neighborhood, " a little pain", but I can handle it.    Time 8    Period Weeks    Status Revised                   Plan - 11/17/20 2329     Personal Factors and Comorbidities Comorbidity 3+    Comorbidities asthma, GERD, breast cancer, HTN, cevical spondylosis, lumbar radiculopathy, OA of bilat knees, hx of donating kidney to husband    Examination-Activity Limitations Locomotion Level;Transfers;Stand    Examination-Participation Restrictions Church;Community Activity;Meal Prep    PT Frequency 2x / week    PT Duration 8 weeks   per recert, including 5/37/4827 week   PT Treatment/Interventions ADLs/Self Care Home Management;Aquatic Therapy;DME Instruction;Neuromuscular re-education;Balance training;Therapeutic exercise;Therapeutic activities;Functional mobility training;Gait training;Patient/family education;Manual techniques;Passive range of motion    PT Next Visit Plan Continue gastroc and hamstring stretch with strengthening of hips/gluts/quads, ankle musculature, and work on standing static balance on compliant surface as well as dynamic balance (hip/ankle/step strategy) and gait activiteis.  Step strategy varied directions;  Please make sure pt gets scheduled for pool 11/24/20 and can discuss options for continued pool exercise at [facility].  Work towards [employer] and anitipcate d/c 11/28/20.    PT Home Exercise Plan Access Code: [email or web address]    Consulted and Agree with Plan of Care Patient             Patient will benefit from skilled therapeutic intervention in order to improve the following deficits and  impairments:  Abnormal gait, Decreased range of motion, Pain, Decreased balance, Impaired flexibility, Decreased mobility, Decreased strength, Postural dysfunction  Visit Diagnosis: Muscle weakness (generalized)  Unsteadiness on feet  Other abnormalities of gait and mobility  Abnormal posture  Pain in right lower leg  Pain in left lower leg     Problem List Patient Active Problem List   Diagnosis Date Noted   Primary osteoarthritis of right knee 05/12/2020   Primary osteoarthritis of left knee 05/12/2020   Hyperlipidemia    Bilateral calf pain 03/05/2019   Genetic testing 02/13/2019   Family history of ovarian cancer    Family history of bladder cancer    Family history of colon cancer    Family history of leukemia    Lumbar radiculopathy 12/08/2018   Myofascial pain 12/08/2018   Impaired gait and mobility 12/08/2018   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   Chronic nonallergic rhinitis 12/29/2016   Mild persistent asthma, uncomplicated 07/86/7544   Vocal fold paralysis, 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   Gastroesophageal reflux disease 12/29/2016   Cervical spondylosis with myelopathy and radiculopathy 11/14/2013   Essential tremor 06/13/2013   Cervical spondylosis without myelopathy 06/13/2013   Breast cancer of lower-outer quadrant of left female breast ([place]) 09/10/2010    Willow Ora, PTA 11/17/2020, 11:30 PM  [facility] [address] [place], [place], [address] Phone: [phone]   Fax:  [phone]  Name: Arva Slaugh  [place] MRN: [id number] Date of Birth: 1941-01-02

## 2020-11-20 DIAGNOSIS — L820 Inflamed seborrheic keratosis: Secondary | ICD-10-CM | POA: Diagnosis not present

## 2020-11-20 DIAGNOSIS — D1801 Hemangioma of skin and subcutaneous tissue: Secondary | ICD-10-CM | POA: Diagnosis not present

## 2020-11-20 DIAGNOSIS — L570 Actinic keratosis: Secondary | ICD-10-CM | POA: Diagnosis not present

## 2020-11-20 DIAGNOSIS — L821 Other seborrheic keratosis: Secondary | ICD-10-CM | POA: Diagnosis not present

## 2020-11-20 DIAGNOSIS — D2262 Melanocytic nevi of left upper limb, including shoulder: Secondary | ICD-10-CM | POA: Diagnosis not present

## 2020-11-20 DIAGNOSIS — L814 Other melanin hyperpigmentation: Secondary | ICD-10-CM | POA: Diagnosis not present

## 2020-11-21 ENCOUNTER — Ambulatory Visit: Payer: Medicare Other | Admitting: Physical Therapy

## 2020-11-24 ENCOUNTER — Other Ambulatory Visit: Payer: Self-pay

## 2020-11-24 ENCOUNTER — Encounter: Payer: Self-pay | Admitting: Cardiology

## 2020-11-24 ENCOUNTER — Ambulatory Visit (INDEPENDENT_AMBULATORY_CARE_PROVIDER_SITE_OTHER): Payer: Medicare Other | Admitting: Cardiology

## 2020-11-24 VITALS — BP 122/80 | HR 98 | Ht 60.0 in | Wt 125.0 lb

## 2020-11-24 DIAGNOSIS — M79662 Pain in left lower leg: Secondary | ICD-10-CM

## 2020-11-24 DIAGNOSIS — I471 Supraventricular tachycardia: Secondary | ICD-10-CM

## 2020-11-24 DIAGNOSIS — E7849 Other hyperlipidemia: Secondary | ICD-10-CM | POA: Diagnosis not present

## 2020-11-24 DIAGNOSIS — G250 Essential tremor: Secondary | ICD-10-CM | POA: Diagnosis not present

## 2020-11-24 DIAGNOSIS — R55 Syncope and collapse: Secondary | ICD-10-CM | POA: Insufficient documentation

## 2020-11-24 DIAGNOSIS — M79661 Pain in right lower leg: Secondary | ICD-10-CM | POA: Diagnosis not present

## 2020-11-24 DIAGNOSIS — R2689 Other abnormalities of gait and mobility: Secondary | ICD-10-CM | POA: Diagnosis not present

## 2020-11-24 MED ORDER — AMLODIPINE BESYLATE 5 MG PO TABS: 5.0000 mg | ORAL_TABLET | Freq: Every day | ORAL | 2 refills | Status: DC

## 2020-11-24 NOTE — Patient Instructions (Addendum)
Medication Instructions:   Decrease Amlodipine to 5 mg  ( 1/2 tablet of 10 mg ) daily . Once bottle is empty,change to 5 mg Amlodipine daily.    *If you need a refill on your cardiac medications before your next appointment, please call your pharmacy*   Lab Work:  Not needed   Testing/Procedures:  Will be schedule at  [place] has requested that you have an echocardiogram. Echocardiography is a painless test that uses sound waves to create images of your heart. It provides your doctor with information about the size and shape of your heart and how well your heart's chambers and valves are working. This procedure takes approximately one hour. There are no restrictions for this procedure. and Will be schedule at [facility] has requested that you have a lower  extremity venous duplex. This test is an ultrasound of the veins in the legs. It looks at venous blood flow that carries blood from the heart to the legs. Allow one hour for a Lower Venous exam. There are no restrictions or special instructions.    Follow-Up: At [facility], you and your health needs are our priority.  As part of our continuing mission to provide you with exceptional heart care, we have created designated Provider Care Teams.  These Care Teams include your primary Cardiologist (physician) and Advanced Practice Providers (APPs -  Physician Assistants and Nurse Practitioners) who all work together to provide you with the care you need, when you need it.     Your next appointment:   1 month(s)  The format for your next appointment:   In Person  Provider:   Glenetta Hew, MD   Other Instructions    Drink plenty of water    Have your grandson  -set up monitoring for heart rate

## 2020-11-24 NOTE — Progress Notes (Signed)
Primary Care Provider: Leeroy Cha, [facility] Cardiologist: Glenetta Hew, MD Electrophysiologist: None Neurologist: Dr. Andrey Spearman Neurosurgeon: Dr. Arnoldo Morale  Clinic Note: Chief Complaint  Patient presents with   Loss of Consciousness    Had an episode of syncope and collapse July 2022   New Patient (Initial Visit)    New was a patient, well-known as a wife of an old patient.    ===================================  ASSESSMENT/PLAN   Problem List Items Addressed This Visit       Cardiology Problems   Hyperlipidemia    Cholesterol 187 and LDL of 112.  In the absence of active cardiac issues, would probably avoid being any more aggressive the current dose of atorvastatin.  We can consider switching to rosuvastatin which would likely help bring her LDL below 100.      Relevant Medications   amLODipine (NORVASC) 5 MG tablet   PSVT (paroxysmal supraventricular tachycardia) (HCC)    Frequent short bursts of tachycardia noted on her monitor, but ranging from 4-15 beats.  Concerns that this could potentially morph into atrial fibrillation or true prolonged SVT.  We did discuss vagal maneuvers.  I do not think this is the cause of her syncope, in fact would be more like to be bradycardic in nature.   After her syncopal event, her son-in-law took her to the [employer] store to buy a new apple watch.  Of asked that she asked her grandson to help her set up the wash to monitor her heart rate and rhythm.      Relevant Medications   amLODipine (NORVASC) 5 MG tablet     Other   Impaired gait and mobility    Related to PD and deconditioning.  Stable.      Bilateral calf pain    Bilateral calf pain which seems to translate to her current issue, however if she does have some venous stasis related cramping the causes the symptoms, venous stasis could could contribute to orthostatic hypotension.  Plan: Check bilateral lower extremity venous duplexes.       Relevant Orders   VAS [place] LOWER EXTREMITY VENOUS REFLUX   Syncope and collapse - Primary    She had 1 episode of passing out this occurred with standing and did not have any prodromal prodromal symptoms.  Most likely cause is decreased p.o. intake (she does not drink a whole lot) with amlodipine as a vasodilator and prolonged standing.  Likely vasovagal in nature.  If it were to be arrhythmogenic, would have been bradycardic and not tachycardic.  I do not think this short-lived bursts of SVT would be enough to cause her to have syncope.  With relatively normal blood pressures, reluctant to add beta-blocker until the potential event is evaluated.  I do not see any purpose of doing carotid Dopplers with no bruit, but will check 2D echocardiogram. With concerns of the being orthostatic hypotension related drop in blood pressure leading to syncope, but I reduced her amlodipine to 5 mg daily.  This would allow [place] to potentially start low-dose beta-blocker for rate control if there is an arrhythmia noted.  Since she also has some lower extremity venous stasis changes with mild edema, will check extremity venous Dopplers to assess for any venous stasis which could also contribute to orthostatic dizziness.  If indicated, would wear support stockings.      Relevant Orders   EKG 12-Lead (Completed)   ECHOCARDIOGRAM COMPLETE   VAS [place] LOWER EXTREMITY VENOUS REFLUX   Essential  tremor (Chronic)    On primidone and Sinemet (carbidopa/levodopa).  Defer to neurology.  Doses have not changed, unlikely cause for syncope.       ===================================  HPI:    April Church is a 80 y.o. female with a PMH notable for Hyperlipidemia, Hypertension, Parkinson's Disease (on Carbidopa-levodopa, and primidone), and History of Breast Cancer (April 2011) who is being seen today for the evaluation of SYNCOPE at the request of Leeroy Cha,*.  April Church is the widow of April Church who was  a long-term patient of mine.  He passed on 05/07/20.  April Church was seen on October 08, 2020 For evaluation of an episode of syncope.  This occurred on September 04, 2020 while at PT.  Apparently she was standing up and fell backwards, hit her head on the hardwood.  No witnesses.  Hi and fall with rib pain.  She had a 14-day Zio patch monitor placed (see below-no arrhythmias besides short bursts of PSVT).  Recent Hospitalizations: None  Reviewed  CV studies:    The following studies were reviewed today: (if available, images/films reviewed: From Epic Chart or Care Everywhere) 14-day Zio Monitor July 2022 (report to be scanned): Predominant sinus rhythm with heart rate range 61 to 142 bpm and average 86 bpm.  Total of 59 episodes of PAT/PSVT ranging from 4 beats to 15 beats.  Fastest was 5 beats at a rate of 193 bpm, longest was 15 beats at a rate of 106 bpm.  Rare isolated PACs (as well as couplets and triplets) with rare isolated PVCs.  No sustained arrhythmias or bradycardia to explain syncope.  None of the SVT episodes were noted on diary.  Interval History:   April Church presents now almost 3 months out from her syncopal episode.  She does note that she Has had some dizziness and lightheadedness since then, but has not had any further presyncopal episodes.  She does have some orthostatic type dizziness and has to "gather herself". The episode in question essentially was somewhat out of the blue.  She said that she was standing up in the kitchen repairing a snack.  She basically lost consciousness and fell backwards and hit her head on the floor.  No significant head trauma noted.  She did not go to be evaluated until the 26th.  Unfortunately, this was not witnessed. She denied any sensation whatsoever of irregular heartbeats or palpitations.  She also denied any sensation of feeling lightheaded or dizzy before it happened.  She cannot recall on the day if she had been eating and  drinking adequately, but she really has not had a problem with that.  She said that her primidone dose had been adjusted but that was after this episode. She does have a somewhat unsteady gait probably related to her Parkinson's.  After her fall, she had some rib pain and scalp pain, and was somewhat more unsteady on her feet.  She is gaining strength and confidence, but still has an unsteady gait.  She is working with physical therapy.  She does have some mild swelling that goes down with foot elevation at night.  Besides this episode, she really has been asymptomatic from a cardiac standpoint:  CV Review of Symptoms (Summary) Cardiovascular ROS: positive for - loss of consciousness and Only to one occasion noted above-not associated with any sensation of dizziness lightheadedness or tachycardia.  No chest pain or pressure.  She does have some intermittent Orthostatic dizziness symptoms negative for -  chest pain, dyspnea on exertion, edema, irregular heartbeat, orthopnea, palpitations, paroxysmal nocturnal dyspnea, rapid heart rate, shortness of breath, or further episodes of syncope or near syncope, TIA or emergency BX.  No claudication.  REVIEWED OF SYSTEMS   Review of Systems  Constitutional:  Negative for malaise/fatigue (She acknowledges that she does not have a lot of energy, does not do routine exercise.) and weight loss.  HENT:  Negative for congestion (Can get congested with wearing a mask all day) and nosebleeds.   Respiratory:  Negative for shortness of breath (Only when wearing a mask all day long and try to be active.).   Cardiovascular:  Negative for orthopnea, claudication and leg swelling.       See HPI  Gastrointestinal:  Negative for blood in stool and melena.  Genitourinary:  Negative for dysuria, flank pain and frequency.  Musculoskeletal:  Positive for falls (Only the 1 fall associated with syncope.  That was in July.  None since then.) and joint pain (Mild arthralgias.).   Neurological:  Positive for dizziness (On occasion, with orthostasis, but has not ever had a collapse episode except for the 22 August 2020). Negative for speech change, focal weakness and weakness.  Psychiatric/Behavioral:  Negative for depression and memory loss. The patient is nervous/anxious. The patient does not have insomnia.    I have reviewed and (if needed) personally updated the patient's problem list, medications, allergies, past medical and surgical history, social and family history.   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 2011- no further tx. now- [name] , [name]   Cyst of finger 11/2011   annular cyst right long finger   Dental crowns present    Dermatitis    Frequency of urination    GERD (gastroesophageal reflux disease)    Hemorrhoid    History of colon polyps 2009   Also noted 2012 and 2017 by colonoscopy.   History of shingles 1971   Had right ischial recurrence in the March 2019   Hyperlipidemia    On atorvastatin   Hypertension    under control, has been on med. x 2 yrs.   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 Leta Baptist), neurosurgeon Dr. Arnoldo Morale   PONV (postoperative nausea and vomiting)    Seasonal allergies    Shingles of eyelid 1971   Right eyelid; recurrent -> last episode 05/11/2017   Tremors of nervous system    hands-essential tremor; associated with Parkinson's.   Trigger finger of right hand 11/2011   long finger    PAST SURGICAL HISTORY   Past Surgical History:  Procedure Laterality Date   14-day Zio Patch Monitor  08/2020   (report to be scanned): Predominant SR w/ HR 61 to 142 bpm and average 86 bpm.  Total of 59 episodes of PAT/PSVT  4 to 15 beats (not noted  on diary).  Fastest was 5 beats at a rate of 193 bpm, longest was 15 beats at a rate of 106 bpm.  Rare isolated PACs (as well as couplets and triplets) with rare isolated PVCs.  No sustained arrhythmias or bradycardia to explain syncope.   ANTERIOR CERVICAL DECOMPRESSION/DISCECTOMY FUSION 4 LEVELS N/A 11/14/2013   Procedure: ANTERIOR  CERVICAL DECOMPRESSION/DISCECTOMY FUSION 4 LEVELS;  Surgeon: Newman Pies, MD;  Location: [facility];  Service: Neurosurgery;  Laterality: N/A;  C34 C45 C56 C67 anterior cervical fusion with interbody prosthesis plating and  bonegraft   APPENDECTOMY  age 56   BREAST LUMPECTOMY  06/16/2009   left; SLN bx.   BREAST LUMPECTOMY  07/01/2009   re-excision   BREAST SURGERY  02/22/1997   reduction   CATARACT EXTRACTION, BILATERAL     COLONOSCOPY WITH PROPOFOL N/A 12/03/2014   Procedure: COLONOSCOPY WITH PROPOFOL;  Surgeon: [name], MD;  Location: [facility];  Service: Endoscopy;  Laterality: N/A;   FOOT SURGERY  06/22/2011   left   KNEE ARTHROSCOPY  03/12/2005   right   KNEE ARTHROSCOPY     left   NASAL SINUS SURGERY     x 2   NEPHRECTOMY LIVING DONOR Left 04/22/2008   donated to spouse 2010([facility])   TRIGGER FINGER RELEASE  12/16/2011   Procedure: RELEASE TRIGGER FINGER/A-1 PULLEY;  Surgeon: Tennis Must, MD;  Location: [facility];  Service: Orthopedics;  Laterality: Right;  RIGHT LONG FINGER TRIGGER RELEASE & ANNULAR CYST EXCISION   TUMOR EXCISION  age 78   right arm    Immunization History  Administered Date(s) Administered   Influenza,inj,Quad PF,6+ Mos 11/16/2013   Influenza-Unspecified 11/21/2019   PFIZER Comirnaty(Gray Top)Covid-19 Tri-Sucrose Vaccine 03/14/2019, 04/04/2019, 10/22/2019   Pneumococcal Conjugate-13 05/29/2013   Pneumococcal Polysaccharide-23 12/12/2015   Tdap 04/23/2013    MEDICATIONS/ALLERGIES   Current Meds  Medication Sig   albuterol (VENTOLIN HFA) 108 (90 Base) MCG/ACT inhaler Inhale 1 puff  into the lungs every 6 (six) hours as needed for wheezing or shortness of breath.   amLODipine (NORVASC) 5 MG tablet Take 1 tablet (5 mg total) by mouth daily.   atorvastatin (LIPITOR) 20 MG tablet Take 20 mg by mouth every other day.    azelastine (ASTELIN) 0.1 % nasal spray Place 1-2 sprays into both nostrils 2 (two) times daily. Use in each nostril as directed   CALCIUM CARBONATE PO Take 1 tablet by mouth 2 (two) times daily.   carbidopa-levodopa (SINEMET IR) 25-100 MG tablet Take 2 tablets by mouth 3 (three) times daily.   Cholecalciferol (VITAMIN D) 1000 UNITS capsule Take 1,000 Units by mouth 2 (two) times daily.   Coenzyme Q10 (CO Q 10) 100 MG CAPS Take by mouth.   cyclobenzaprine (FLEXERIL) 5 MG tablet TAKE 1 TABLET BY MOUTH EVERY DAY AT BEDTIME AS NEEDED FOR  MUSCLE  SPASMS,  YOU  CAN  TAKE  UP  TO  2  TABLETS/10MG   MAX  IF  NEEDED.   EPINEPHrine 0.3 mg/0.3 mL IJ SOAJ injection Inject 0.3 mLs (0.3 mg total) into the muscle as needed.   famotidine (PEPCID) 20 MG tablet Take 1 tablet (20 mg total) by mouth daily.   fluticasone (FLOVENT HFA) 110 MCG/ACT inhaler 1 puff   hydrOXYzine (ATARAX/VISTARIL) 10 MG tablet Take 10 mg by mouth at bedtime.   ipratropium (ATROVENT) 0.03 % nasal spray Place 2 sprays into both nostrils 2 (two) times daily.   ketotifen (ZADITOR) 0.025 % ophthalmic solution    levocetirizine (XYZAL) 5 MG tablet TAKE 1 TABLET BY MOUTH ONCE DAILY IN THE EVENING   Polyethyl Glycol-Propyl Glycol (SYSTANE OP) Place 1 drop into both eyes 2 (two) times daily.   primidone (MYSOLINE) 50 MG tablet Take 2 tablets (100 mg total) by mouth 3 (three) times daily.   SYMBICORT 160-4.5 MCG/ACT inhaler  Inhale 2 puffs into the lungs 2 (two) times daily.   traMADol (ULTRAM) 50 MG tablet Take 1 tablet (50 mg total) by mouth every 12 (twelve) hours as needed.   triamcinolone cream (KENALOG) 0.1 % Apply 1 application topically 2 (two) times daily as needed (FOR RASH).   venlafaxine (EFFEXOR) 75 MG  tablet Take 1 tablet (75 mg total) by mouth daily.   [DISCONTINUED] amLODipine (NORVASC) 10 MG tablet Take 10 mg by mouth daily.    Allergies  Allergen Reactions   Shellfish Allergy Shortness Of Breath   Duloxetine Itching and Rash   Sulfa Drugs Cross Reactors Anxiety and Rash   Lisinopril     Other reaction(s): cough   Lyrica [Pregabalin] Other (See Comments)    "made me drunk, couldn't function"   Other     Other reaction(s): hyper   Sulfa Antibiotics     Other reaction(s): hyper, break out in a rash   Codeine Anxiety    SOCIAL HISTORY/FAMILY HISTORY   Reviewed in Epic:   Social History   Tobacco Use   Smoking status: Never   Smokeless tobacco: Never  Substance Use Topics   Alcohol use: No    Alcohol/week: 0.0 standard drinks   Drug use: No   Social History   Social History Narrative   10/13/20    Recently widowed-lives alone, husband April Church) passed 04/26/2020    Patient has 1 daughter who lives in [place], [place]-> 1 grandson who lives in [place]   Patient has a HS education.    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    Patient is right handed.    Patient drinks caffeine occasionally.     Family History  Problem Relation Age of Onset   Kidney disease Mother    Stroke Father    Stroke Sister        08/2015   Diabetes Brother    Heart disease Brother    Ovarian cancer Sister 45   Heart disease Paternal Grandmother    Heart disease Paternal Grandfather    Leukemia Other 45       brother's grandson   Bladder Cancer Brother 86   Colon cancer Niece        dx in late 69s    OBJCTIVE -PE, EKG, labs   Wt Readings from Last 3 Encounters:  11/24/20 125 lb (56.7 kg)  10/13/20 128 lb 6.4 oz (58.2 kg)  10/03/20 129 lb (58.5 kg)    Physical Exam: BP 122/80 (BP Location: Right Arm, Patient Position: Sitting, Cuff Size: Normal)   Pulse 98   Ht 5' (1.524 m)   Wt 125 lb (56.7 kg)   SpO2 98%   BMI 24.41 kg/m  Physical  Exam Constitutional:      General: She is not in acute distress.    Appearance: Normal appearance. She is not ill-appearing, toxic-appearing or diaphoretic.     Comments: Somewhat thin and frail appearing elderly woman who is in no acute distress.  She looks physically stable compared to the preceding few years, just a bit older.  HENT:     Head: Normocephalic and atraumatic.  Neck:     Vascular: No carotid bruit.  Cardiovascular:     Rate and Rhythm: Normal rate and regular rhythm.     Pulses: Normal pulses.     Heart sounds: Normal heart sounds. No murmur heard.   No friction rub. No gallop.     Comments: Borderline  tachycardic Pulmonary:     Effort: Pulmonary effort is normal. No respiratory distress.     Breath sounds: Normal breath sounds.  Chest:     Chest wall: No tenderness.  Abdominal:     General: Abdomen is flat. Bowel sounds are normal. There is no distension.     Palpations: Abdomen is soft. There is no mass (No HSM or bruits).     Tenderness: There is no abdominal tenderness.  Musculoskeletal:        General: No swelling (Trivial bilateral ankle swelling.) or tenderness.     Cervical back: Normal range of motion and neck supple. No rigidity.  Skin:    General: Skin is warm and dry.  Neurological:     Mental Status: She is alert and oriented to person, place, and time.     Cranial Nerves: No cranial nerve deficit.     Gait: Gait abnormal (Slow somewhat unsteady and deliberate).     Comments: Mild baseline right greater than left hand tremor.  She does have some hesitated/stuttered speech with mild hoarseness.  This is her baseline voice.  Psychiatric:        Mood and Affect: Mood normal.        Behavior: Behavior normal.        Thought Content: Thought content normal.        Judgment: Judgment normal.     Adult ECG Report  Rate: 98;  Rhythm: normal sinus rhythm and Low voltage.  Otherwise normal axis, intervals and durations. ;   Narrative Interpretation:  Stable/normal  Recent Labs: Reviewed.  06/05/2020: TC 187, TG 127, HDL 64, LDL 112; Hgb 12.2,Cr 0.65, K+ 4.5. Lab Results  Component Value Date   CREATININE 0.65 07/30/2020   BUN 11 07/30/2020   NA 142 07/30/2020   K 4.5 07/30/2020   CL 100 07/30/2020   CO2 25 07/30/2020   CBC Latest Ref Rng & Units 01/20/2015 01/21/2014 11/14/2013  WBC 3.9 - 10.3 10e3/uL 5.2 5.8 -  Hemoglobin 11.6 - 15.9 g/dL 12.0 10.5(L) 9.5(L)  Hematocrit 34.8 - 46.6 % 36.1 32.7(L) 28.0(L)  Platelets 145 - 400 10e3/uL 293 384 -    No results found for: HGBA1C No results found for: TSH  ==================================================  COVID-19 Education: The signs and symptoms of COVID-19 were discussed with the patient and how to seek care for testing (follow up with PCP or arrange E-visit).    I spent a total of 45 minutes with the patient spent in direct patient consultation.  Additional time spent with chart review  / charting (studies, outside notes, etc): 20 min Total Time: 65 min  Current medicines are reviewed at length with the patient today.  (+/- concerns) None none  This visit occurred during the SARS-CoV-2 public health emergency.  Safety protocols were in place, including screening questions prior to the visit, additional usage of staff PPE, and extensive cleaning of exam room while observing appropriate contact time as indicated for disinfecting solutions.  Notice: This dictation was prepared with Dragon dictation along with smart phrase technology. Any transcriptional errors that result from this process are unintentional and may not be corrected upon review.   Studies Ordered:  Orders Placed This Encounter  Procedures   EKG 12-Lead   ECHOCARDIOGRAM COMPLETE   VAS [place] LOWER EXTREMITY VENOUS REFLUX    Patient Instructions / Medication Changes & Studies & Tests Ordered   Patient Instructions  Medication Instructions:   Decrease Amlodipine to 5 mg  ( 1/2 tablet  of 10 mg ) daily .  Once bottle is empty,change to 5 mg Amlodipine daily.    *If you need a refill on your cardiac medications before your next appointment, please call your pharmacy*   Lab Work:  Not needed   Testing/Procedures:  Will be schedule at  [facility] has requested that you have an echocardiogram. Echocardiography is a painless test that uses sound waves to create images of your heart. It provides your doctor with information about the size and shape of your heart and how well your heart's chambers and valves are working. This procedure takes approximately one hour. There are no restrictions for this procedure. and Will be schedule at [facility] has requested that you have a lower  extremity venous duplex. This test is an ultrasound of the veins in the legs. It looks at venous blood flow that carries blood from the heart to the legs. Allow one hour for a Lower Venous exam. There are no restrictions or special instructions.    Follow-Up: At [facility], you and your health needs are our priority.  As part of our continuing mission to provide you with exceptional heart care, we have created designated Provider Care Teams.  These Care Teams include your primary Cardiologist (physician) and Advanced Practice Providers (APPs -  Physician Assistants and Nurse Practitioners) who all work together to provide you with the care you need, when you need it.     Your next appointment:   1 month(s)  The format for your next appointment:   In Person  Provider:   Glenetta Hew, MD   Other Instructions    Drink plenty of water    Have your grandson  -set up monitoring for heart rate    Glenetta Hew, M.D., M.S. Interventional Cardiologist   Pager # [phone] Phone # [phone] [address] [place], [place] [address]   Thank you for choosing Heartcare at [facility]!!

## 2020-11-26 ENCOUNTER — Ambulatory Visit: Payer: Medicare Other | Admitting: Physical Therapy

## 2020-11-26 ENCOUNTER — Encounter: Payer: Self-pay | Admitting: Cardiology

## 2020-11-26 DIAGNOSIS — I471 Supraventricular tachycardia: Secondary | ICD-10-CM | POA: Insufficient documentation

## 2020-11-26 NOTE — Assessment & Plan Note (Signed)
Related to PD and deconditioning.  Stable.

## 2020-11-26 NOTE — Assessment & Plan Note (Signed)
Frequent short bursts of tachycardia noted on her monitor, but ranging from 4-15 beats.  Concerns that this could potentially morph into atrial fibrillation or true prolonged SVT.  We did discuss vagal maneuvers.  I do not think this is the cause of her syncope, in fact would be more like to be bradycardic in nature.   After her syncopal event, her son-in-law took her to the [employer] store to buy a new apple watch.  Of asked that she asked her grandson to help her set up the wash to monitor her heart rate and rhythm.

## 2020-11-26 NOTE — Assessment & Plan Note (Signed)
She had 1 episode of passing out this occurred with standing and did not have any prodromal prodromal symptoms.  Most likely cause is decreased p.o. intake (she does not drink a whole lot) with amlodipine as a vasodilator and prolonged standing.  Likely vasovagal in nature.  If it were to be arrhythmogenic, would have been bradycardic and not tachycardic.  I do not think this short-lived bursts of SVT would be enough to cause her to have syncope.  With relatively normal blood pressures, reluctant to add beta-blocker until the potential event is evaluated.  I do not see any purpose of doing carotid Dopplers with no bruit, but will check 2D echocardiogram. With concerns of the being orthostatic hypotension related drop in blood pressure leading to syncope, but I reduced her amlodipine to 5 mg daily.  This would allow [place] to potentially start low-dose beta-blocker for rate control if there is an arrhythmia noted.  Since she also has some lower extremity venous stasis changes with mild edema, will check extremity venous Dopplers to assess for any venous stasis which could also contribute to orthostatic dizziness.  If indicated, would wear support stockings.

## 2020-11-26 NOTE — Assessment & Plan Note (Signed)
Bilateral calf pain which seems to translate to her current issue, however if she does have some venous stasis related cramping the causes the symptoms, venous stasis could could contribute to orthostatic hypotension.  Plan: Check bilateral lower extremity venous duplexes.

## 2020-11-26 NOTE — Assessment & Plan Note (Signed)
On primidone and Sinemet (carbidopa/levodopa).  Defer to neurology.  Doses have not changed, unlikely cause for syncope.

## 2020-11-26 NOTE — Assessment & Plan Note (Signed)
Cholesterol 187 and LDL of 112.  In the absence of active cardiac issues, would probably avoid being any more aggressive the current dose of atorvastatin.  We can consider switching to rosuvastatin which would likely help bring her LDL below 100.

## 2020-11-27 ENCOUNTER — Ambulatory Visit: Payer: Medicare Other | Admitting: Physical Therapy

## 2020-11-27 DIAGNOSIS — G20 Parkinson's disease: Secondary | ICD-10-CM | POA: Diagnosis not present

## 2020-11-27 DIAGNOSIS — C50512 Malignant neoplasm of lower-outer quadrant of left female breast: Secondary | ICD-10-CM | POA: Diagnosis not present

## 2020-11-27 DIAGNOSIS — J45909 Unspecified asthma, uncomplicated: Secondary | ICD-10-CM | POA: Diagnosis not present

## 2020-11-27 DIAGNOSIS — I10 Essential (primary) hypertension: Secondary | ICD-10-CM | POA: Diagnosis not present

## 2020-11-27 DIAGNOSIS — G4700 Insomnia, unspecified: Secondary | ICD-10-CM | POA: Diagnosis not present

## 2020-11-27 DIAGNOSIS — F3341 Major depressive disorder, recurrent, in partial remission: Secondary | ICD-10-CM | POA: Diagnosis not present

## 2020-11-27 DIAGNOSIS — E785 Hyperlipidemia, unspecified: Secondary | ICD-10-CM | POA: Diagnosis not present

## 2020-11-27 DIAGNOSIS — M4726 Other spondylosis with radiculopathy, lumbar region: Secondary | ICD-10-CM | POA: Diagnosis not present

## 2020-12-01 ENCOUNTER — Encounter: Payer: Self-pay | Admitting: Physical Therapy

## 2020-12-01 ENCOUNTER — Other Ambulatory Visit: Payer: Self-pay

## 2020-12-01 ENCOUNTER — Ambulatory Visit: Payer: Medicare Other | Attending: Internal Medicine | Admitting: Physical Therapy

## 2020-12-01 DIAGNOSIS — R293 Abnormal posture: Secondary | ICD-10-CM

## 2020-12-01 DIAGNOSIS — M6281 Muscle weakness (generalized): Secondary | ICD-10-CM

## 2020-12-01 DIAGNOSIS — M79661 Pain in right lower leg: Secondary | ICD-10-CM | POA: Diagnosis not present

## 2020-12-01 DIAGNOSIS — R2681 Unsteadiness on feet: Secondary | ICD-10-CM | POA: Diagnosis not present

## 2020-12-01 DIAGNOSIS — R2689 Other abnormalities of gait and mobility: Secondary | ICD-10-CM | POA: Diagnosis not present

## 2020-12-01 DIAGNOSIS — M79662 Pain in left lower leg: Secondary | ICD-10-CM

## 2020-12-01 NOTE — Therapy (Signed)
[facility] [address] [place], [place], [address] Phone: [phone]   Fax:  [phone]  Physical Therapy Treatment  Patient Details  Name: April Church MRN: [id number] Date of Birth: 03/08/1940 Referring Provider (PT): Dr. Megan Lovorn   Encounter Date: 12/01/2020   PT End of Session - 12/01/20 1936     Visit Number 22    Number of Visits 26    Date for PT Re-Evaluation 11/28/20    Authorization Type Medicare    Progress Note Due on Visit 30    PT Start Time 1402    PT Stop Time 1445    PT Time Calculation (min) 43 min    Equipment Utilized During Treatment Other (comment)   foam noodles, single bar bells,   Activity Tolerance Patient tolerated treatment well;No increased pain    Behavior During Therapy WFL for tasks assessed/performed             Past Medical History:  Diagnosis Date   Acquired solitary kidney 04/2008   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 2011- no further tx. now- Dr. Rubin , Dr. Murray   Cyst of finger 11/2011   annular cyst right long finger   Dental crowns present    Dermatitis    Frequency of urination    GERD (gastroesophageal reflux disease)    Hemorrhoid    History of colon polyps 2009   Also noted 2012 and 2017 by colonoscopy.   History of shingles 1971   Had right ischial recurrence in the March 2019   Hyperlipidemia    On atorvastatin   Hypertension    under control, has been on med. x 2 yrs.   Liposarcoma of left shoulder (HCC) 1969   Parkinson's disease (HCC)    With mild tremor (neurologist Dr. Penumalli), neurosurgeon Dr. Jenkins   PONV (postoperative nausea and vomiting)    Seasonal allergies    Shingles of eyelid 1971   Right eyelid; recurrent -> last episode 05/11/2017   Tremors of nervous system    hands-essential tremor; associated  with Parkinson's.   Trigger finger of right hand 11/2011   long finger    Past Surgical History:  Procedure Laterality Date   14-day Zio Patch Monitor  08/2020   (report to be scanned): Predominant SR w/ HR 61 to 142 bpm and average 86 bpm.  Total of 59 episodes of PAT/PSVT  4 to 15 beats (not noted on diary).  Fastest was 5 beats at a rate of 193 bpm, longest was 15 beats at a rate of 106 bpm.  Rare isolated PACs (as well as couplets and triplets) with rare isolated PVCs.  No sustained arrhythmias or bradycardia to explain syncope.   ANTERIOR CERVICAL DECOMPRESSION/DISCECTOMY FUSION 4 LEVELS N/A 11/14/2013   Procedure: ANTERIOR CERVICAL DECOMPRESSION/DISCECTOMY FUSION 4 LEVELS;  Surgeon: Jeffrey Jenkins, MD;  Location: [facility];  Service: Neurosurgery;  Laterality: N/A;  C34 C45 C56 C67 anterior cervical fusion with interbody prosthesis plating and  bonegraft   APPENDECTOMY  age 18   BREAST LUMPECTOMY  06/16/2009   left; SLN bx.   BREAST LUMPECTOMY  07/01/2009   re-excision   BREAST SURGERY  02/22/1997   reduction   CATARACT EXTRACTION, BILATERAL     COLONOSCOPY WITH PROPOFOL N/A 12/03/2014   Procedure: COLONOSCOPY WITH PROPOFOL;  Surgeon: Martin K Johnson,   MD;  Location: [facility];  Service: Endoscopy;  Laterality: N/A;   FOOT SURGERY  06/22/2011   left   KNEE ARTHROSCOPY  03/12/2005   right   KNEE ARTHROSCOPY     left   NASAL SINUS SURGERY     x 2   NEPHRECTOMY LIVING DONOR Left 04/22/2008   donated to spouse 2010([facility])   TRIGGER FINGER RELEASE  12/16/2011   Procedure: RELEASE TRIGGER FINGER/A-1 PULLEY;  Surgeon: Tennis Must, MD;  Location: [facility];  Service: Orthopedics;  Laterality: Right;  RIGHT LONG FINGER TRIGGER RELEASE & ANNULAR CYST EXCISION   TUMOR EXCISION  age 56   right arm    There were no vitals filed for this visit.   Subjective Assessment - 12/01/20 1935     Limitations Walking;Standing    How long can you stand comfortably? <  5 minutes    How long can you walk comfortably? 10 minutes or less    Patient Stated Goals Pt's goals for therapy are to get rid of pain, and improve strength.    Currently in Pain? Yes    Pain Score 5     Pain Location Calf    Pain Orientation Right    Pain Descriptors / Indicators Aching;Sore    Pain Type Chronic pain    Pain Onset More than a month ago    Pain Frequency Intermittent    Aggravating Factors  unsure, however pain increases at times    Pain Relieving Factors stretching, pain medication             Aquatic therapy at [facility] - pool temp 92 degrees   Patient seen for aquatic therapy today.  Treatment took place in water 3.5-4.5 feet deep depending upon activity.  Pt entered and  Exited the pool via step negotiation with use of bil. Hands rails, step by step with descension and with ascension for exiting the pool.  Pt able to make way over to bench in water to start session with min guard assist.   Stretching/strengthening: Seated on bench:  Seated in long sitting for passive hamstring/calf stretching for 30 sec's x 3 reps each LE Use of pool noodle for passive calf rolling/massage for decreased tightness and pain bil sides  At wall: in 3.5 foot water with UE support at wall Bil LE's: heel<>toe raises x 15 reps. Then alternating hip abduction, hip extension, marching for 15 reps each side. Cues on posture and form.  Walking : in 3.5 foot water Forward/backward gait across the pool with no AD for 3 laps each way, min guard with cues on posture/step length Lateral stepping across pool for x 3 laps each way Wih single bar bell forward walking with alternating punching for 2 laps, min guard assist Lateral stepping with simultaneous shoulder abduction/adduction for 2 laps toward each way, min guard assist  Holding the bar bells down in water by hips while marching forward across the pool for 2 laps, min guard assist Balance wih single bar bell feet hip width apart  for: shoulder horizontal abduction/adduction, shoulder adduction into water down by hips/abduction back to water surface, then with arms at extension holding bar bells under water by hips for cross country ski arm movements, all performed for several reps each with min guard to min assist, cues on posture and abdominal bracing to assist with balance.  Walking forward tandem x 2 laps with HHA/min assist, cues for heel>toe step placement Single leg stance at wall with single  UE support for : holding one UE up into water and then moving leg out laterally/back in for 10 reps, then same side one arm reaching forward, the leg kicked back-switching the limb positions back/forth so they are moving opposite each other for several reps. Performed on both sides with verbal and visual cues.     Pt requires buoyancy of water for support for reduced fall risk with gait training and balance exercises with minimal UE support; exercises able to be performed safely in water without the risk of fall compared to those same exercises performed on land;  viscosity of water needed for resistance for strengthening.  Current of water provides perturbations for challenging static & dynamic standing balance.             PT Short Term Goals - 11/06/20 1257       PT SHORT TERM GOAL #1   Title Pt will be independent with progression of HEP for decreased pain and improved functional strength, transfers, balance,  and gait.  TARGET 10/31/2020    Baseline 10/31/20: met with current program, will benefit from advancement as pt progresses    Status Achieved      PT SHORT TERM GOAL #2   Title Pt will improve 5x sit<>stand to less than or equal to 18 seconds for improved functional lower extremity strength.    Baseline 10/31/20: 15.88 sec's no UE support from standard height surface    Status Achieved      PT SHORT TERM GOAL #3   Title Pt will improve posterior push and release test to 2 steps or less for improved balance  recovery.    Baseline 10/31/20: met in session today    Status Achieved      PT SHORT TERM GOAL #4   Baseline 7/1               PT Long Term Goals - 11/06/20 1257       PT LONG TERM GOAL #1   Title Pt will verbalize plans for continued community fitness/HEP to maximize gains made in PT.  TARGET 11/28/2020    Time 8    Period Weeks    Status New      PT LONG TERM GOAL #2   Title Pt will improve 5x sit<>stand to less than or equal to 15 seconds for improved functional strength.    Baseline 21.09 sec 10/09/2020    Time 8    Period Weeks    Status On-going      PT LONG TERM GOAL #3   Title Pt will improve MiniBESTest to at least 20/28 for decreased fall risk.    Baseline 17/28 10/09/20    Time 8    Period Weeks    Status New      PT LONG TERM GOAL #4   Title Pt will imrpove TUG score and TUG cog score to less than or equal to 10% difference for improved dual tasking with gait.    Baseline 16.9, 23.56; TUG 13 sec, TUG cogn 15 seconds 10/09/20    Time 8    Period Weeks    Status On-going      PT LONG TERM GOAL #5   Title Pt will ambulate at least 20 minutes with appropriate standing rest breaks, without c/o increased pain or fatigue for improved gait indpedence in her neighborhood.    Baseline 10 minutes before having to stop; 10/09/20:  reports 15 minutes of walking in neighborhood, " a little pain",   but I can handle it.    Time 8    Period Weeks    Status Revised                   Plan - 12/01/20 2006     Clinical Impression Statement Today's skilled session continued to focus on strengthening, gait and balance training in the aquatic setting with no issues noted or reported. No change in calf tightness/pain with session. The pt is progressing toward goals.    Personal Factors and Comorbidities Comorbidity 3+    Comorbidities asthma, GERD, breast cancer, HTN, cevical spondylosis, lumbar radiculopathy, OA of bilat knees, hx of donating kidney to husband     Examination-Activity Limitations Locomotion Level;Transfers;Stand    Examination-Participation Restrictions Church;Community Activity;Meal Prep    PT Frequency 2x / week    PT Duration 8 weeks   per recert, including 1/96/2229 week   PT Treatment/Interventions ADLs/Self Care Home Management;Aquatic Therapy;DME Instruction;Neuromuscular re-education;Balance training;Therapeutic exercise;Therapeutic activities;Functional mobility training;Gait training;Patient/family education;Manual techniques;Passive range of motion    PT Next Visit Plan check LTGs for anticipated discharge    PT Home Exercise Plan Access Code: [email or web address]    Consulted and Agree with Plan of Care Patient             Patient will benefit from skilled therapeutic intervention in order to improve the following deficits and impairments:  Abnormal gait, Decreased range of motion, Pain, Decreased balance, Impaired flexibility, Decreased mobility, Decreased strength, Postural dysfunction  Visit Diagnosis: Muscle weakness (generalized)  Unsteadiness on feet  Other abnormalities of gait and mobility  Abnormal posture  Pain in right lower leg  Pain in left lower leg     Problem List Patient Active Problem List   Diagnosis Date Noted   PSVT (paroxysmal supraventricular tachycardia) ([place]) 11/26/2020   Syncope and collapse 11/24/2020   Primary osteoarthritis of right knee 05/12/2020   Primary osteoarthritis of left knee 05/12/2020   Hyperlipidemia    Bilateral calf pain 03/05/2019   Genetic testing 02/13/2019   Family history of ovarian cancer    Family history of bladder cancer    Family history of colon cancer    Family history of leukemia    Lumbar radiculopathy 12/08/2018   Myofascial pain 12/08/2018   Impaired gait and mobility 12/08/2018   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   Chronic nonallergic rhinitis 12/29/2016   Mild persistent asthma, uncomplicated 79/89/2119   Vocal fold paralysis, 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

## 2020-12-04 ENCOUNTER — Encounter (HOSPITAL_COMMUNITY): Payer: Medicare Other

## 2020-12-04 DIAGNOSIS — M4726 Other spondylosis with radiculopathy, lumbar region: Secondary | ICD-10-CM | POA: Diagnosis not present

## 2020-12-04 DIAGNOSIS — I10 Essential (primary) hypertension: Secondary | ICD-10-CM | POA: Diagnosis not present

## 2020-12-04 DIAGNOSIS — R55 Syncope and collapse: Secondary | ICD-10-CM | POA: Diagnosis not present

## 2020-12-04 DIAGNOSIS — E785 Hyperlipidemia, unspecified: Secondary | ICD-10-CM | POA: Diagnosis not present

## 2020-12-04 DIAGNOSIS — G20 Parkinson's disease: Secondary | ICD-10-CM | POA: Diagnosis not present

## 2020-12-04 DIAGNOSIS — F3341 Major depressive disorder, recurrent, in partial remission: Secondary | ICD-10-CM | POA: Diagnosis not present

## 2020-12-04 DIAGNOSIS — R2681 Unsteadiness on feet: Secondary | ICD-10-CM | POA: Diagnosis not present

## 2020-12-04 DIAGNOSIS — Z23 Encounter for immunization: Secondary | ICD-10-CM | POA: Diagnosis not present

## 2020-12-10 ENCOUNTER — Ambulatory Visit (HOSPITAL_COMMUNITY): Payer: Medicare Other | Attending: Cardiology

## 2020-12-10 ENCOUNTER — Other Ambulatory Visit: Payer: Self-pay

## 2020-12-10 DIAGNOSIS — R55 Syncope and collapse: Secondary | ICD-10-CM | POA: Diagnosis not present

## 2020-12-10 HISTORY — PX: TRANSTHORACIC ECHOCARDIOGRAM: SHX275

## 2020-12-10 LAB — ECHOCARDIOGRAM COMPLETE
Area-P 1/2: 4.15 cm2
S' Lateral: 1.75 cm

## 2020-12-11 ENCOUNTER — Other Ambulatory Visit: Payer: Self-pay | Admitting: Family Medicine

## 2020-12-12 ENCOUNTER — Other Ambulatory Visit: Payer: Self-pay

## 2020-12-12 ENCOUNTER — Ambulatory Visit (HOSPITAL_COMMUNITY)
Admission: RE | Admit: 2020-12-12 | Discharge: 2020-12-12 | Disposition: A | Discharge status: Home / Self Care | Payer: Medicare Other | Source: Ambulatory Visit | Attending: Cardiovascular Disease | Admitting: Cardiovascular Disease

## 2020-12-12 DIAGNOSIS — M79661 Pain in right lower leg: Secondary | ICD-10-CM

## 2020-12-12 DIAGNOSIS — M79662 Pain in left lower leg: Secondary | ICD-10-CM | POA: Diagnosis not present

## 2020-12-12 DIAGNOSIS — R55 Syncope and collapse: Secondary | ICD-10-CM

## 2020-12-12 HISTORY — PX: OTHER SURGICAL HISTORY: SHX169

## 2020-12-15 ENCOUNTER — Ambulatory Visit: Payer: Medicare Other | Admitting: Physical Medicine and Rehabilitation

## 2020-12-15 DIAGNOSIS — Z23 Encounter for immunization: Secondary | ICD-10-CM | POA: Diagnosis not present

## 2020-12-18 ENCOUNTER — Ambulatory Visit: Payer: Medicare Other | Admitting: Physical Therapy

## 2020-12-18 ENCOUNTER — Other Ambulatory Visit: Payer: Self-pay

## 2020-12-18 ENCOUNTER — Encounter: Payer: Self-pay | Admitting: Physical Therapy

## 2020-12-18 DIAGNOSIS — M6281 Muscle weakness (generalized): Secondary | ICD-10-CM

## 2020-12-18 DIAGNOSIS — M79661 Pain in right lower leg: Secondary | ICD-10-CM | POA: Diagnosis not present

## 2020-12-18 DIAGNOSIS — R293 Abnormal posture: Secondary | ICD-10-CM | POA: Diagnosis not present

## 2020-12-18 DIAGNOSIS — M79662 Pain in left lower leg: Secondary | ICD-10-CM | POA: Diagnosis not present

## 2020-12-18 DIAGNOSIS — R2681 Unsteadiness on feet: Secondary | ICD-10-CM

## 2020-12-18 DIAGNOSIS — R2689 Other abnormalities of gait and mobility: Secondary | ICD-10-CM | POA: Diagnosis not present

## 2020-12-19 NOTE — Therapy (Signed)
[facility] [address] [place], [place], [address] Phone: [phone]   Fax:  [phone]  Physical Therapy Treatment  Patient Details  Name: April Church MRN: [id number] Date of Birth: Jun 18, 1940 Referring Provider (PT): Dr. Courtney Heys   Encounter Date: 12/18/2020   PT End of Session - 12/19/20 0935     Visit Number 23    Number of Visits 27    Date for PT Re-Evaluation 01/16/21    Authorization Type Medicare    Authorization Time Period 12-18-20 - 02-21-21    Progress Note Due on Visit 30    PT Start Time 1150    PT Stop Time 1233    PT Time Calculation (min) 43 min    Equipment Utilized During Treatment --   foam noodles, single bar bells,   Activity Tolerance Patient tolerated treatment well    Behavior During Therapy [facility] for tasks assessed/performed             Past Medical History:  Diagnosis Date   Acquired solitary kidney 04/2008   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 2011- no further tx. now- [name] , [name]   Cyst of finger 11/2011   annular cyst right long finger   Dental crowns present    Dermatitis    Frequency of urination    GERD (gastroesophageal reflux disease)    Hemorrhoid    History of colon polyps 2009   Also noted 2012 and 2017 by colonoscopy.   History of shingles 1971   Had right ischial recurrence in the March 2019   Hyperlipidemia    On atorvastatin   Hypertension    under control, has been on med. x 2 yrs.   Liposarcoma of left shoulder ([place]) 1969   Parkinson's disease ([place])    With mild tremor (neurologist Dr. Leta Baptist), neurosurgeon Dr. Arnoldo Morale   PONV (postoperative nausea and vomiting)    Seasonal allergies    Shingles of eyelid 1971   Right eyelid; recurrent -> last episode 05/11/2017   Tremors of nervous system    hands-essential  tremor; associated with Parkinson's.   Trigger finger of right hand 11/2011   long finger    Past Surgical History:  Procedure Laterality Date   14-day Zio Patch Monitor  08/2020   (report to be scanned): Predominant SR w/ HR 61 to 142 bpm and average 86 bpm.  Total of 59 episodes of PAT/PSVT  4 to 15 beats (not noted on diary).  Fastest was 5 beats at a rate of 193 bpm, longest was 15 beats at a rate of 106 bpm.  Rare isolated PACs (as well as couplets and triplets) with rare isolated PVCs.  No sustained arrhythmias or bradycardia to explain syncope.   ANTERIOR CERVICAL DECOMPRESSION/DISCECTOMY FUSION 4 LEVELS N/A 11/14/2013   Procedure: ANTERIOR CERVICAL DECOMPRESSION/DISCECTOMY FUSION 4 LEVELS;  Surgeon: Newman Pies, MD;  Location: [facility];  Service: Neurosurgery;  Laterality: N/A;  C34 C45 C56 C67 anterior cervical fusion with interbody prosthesis plating and  bonegraft   APPENDECTOMY  age 34   BREAST LUMPECTOMY  06/16/2009   left; SLN bx.   BREAST LUMPECTOMY  07/01/2009   re-excision   BREAST SURGERY  02/22/1997   reduction   CATARACT EXTRACTION, BILATERAL     COLONOSCOPY WITH PROPOFOL N/A 12/03/2014   Procedure: COLONOSCOPY WITH  PROPOFOL;  Surgeon: [name], MD;  Location: [facility];  Service: Endoscopy;  Laterality: N/A;   FOOT SURGERY  06/22/2011   left   KNEE ARTHROSCOPY  03/12/2005   right   KNEE ARTHROSCOPY     left   NASAL SINUS SURGERY     x 2   NEPHRECTOMY LIVING DONOR Left 04/22/2008   donated to spouse 2010([facility])   TRIGGER FINGER RELEASE  12/16/2011   Procedure: RELEASE TRIGGER FINGER/A-1 PULLEY;  Surgeon: Tennis Must, MD;  Location: [facility];  Service: Orthopedics;  Laterality: Right;  RIGHT LONG FINGER TRIGGER RELEASE & ANNULAR CYST EXCISION   TUMOR EXCISION  age 13   right arm    There were no vitals filed for this visit.   Subjective Assessment - 12/18/20 1200     Subjective Pt reports she continues to have Rt calf  pain and doesn't know if it is related to Parkinson's or due to her back, as she states she has had a pinched nerve in her back in the past and had leg pain with this.  States she doesn't feel she is ready to finish up PT - feels she could benefit from a few more sessions.  Pt states one of the main problems she is having is getting up to standing position from sitting; pt reports Rt leg hurts more in calf and knee area;  pt reports it is a real effort to get her Rt leg moving in the morning when she gets up - states it hurt alot. Has joined the [facility] for pool classes but has not started them yet.    Limitations Walking;Standing    How long can you stand comfortably? < 5 minutes    How long can you walk comfortably? 10 minutes or less    Patient Stated Goals Pt's goals for therapy are to get rid of pain, and improve strength.    Currently in Pain? Yes    Pain Score 5     Pain Location Calf    Pain Orientation Right    Pain Descriptors / Indicators Aching;Sore;Discomfort    Pain Type Chronic pain    Pain Onset More than a month ago    Pain Frequency Intermittent                               [facility] Adult PT Treatment/Exercise - 12/19/20 0001       Transfers   Transfers Sit to Stand;Stand to Sit    Sit to Stand 5: Supervision;Without upper extremity assist    Five time sit to stand comments  19.72    Stand to Sit 5: Supervision;With upper extremity assist      Timed Up and Go Test   TUG Normal TUG;Cognitive TUG    Normal TUG (seconds) 15.97    Cognitive TUG (seconds) 21.09      Mini-BESTest   Stand on one leg (right) Moderate: < 20 s   6.4, 1.13 sec   Compensatory Stepping Correction - Backward Moderate: More than one step is required to recover equilibrium    Compensatory Stepping Correction - Left Lateral Normal: Recovers independently with 1 step (crossover or lateral OK)    Compensatory Stepping Correction - Right Lateral Moderate: Several steps to recover  equilibrium    Stepping Corredtion Lateral - lowest score 1    Stance - Feet together, eyes open, firm surface  Normal: 30s  Stance - Feet together, eyes closed, foam surface  Moderate: < 30s    Incline - Eyes Closed Moderate: Stands independently < 30s OR aligns with surface   7.5 sec                      PT Short Term Goals - 12/19/20 8527       PT SHORT TERM GOAL #1   Title Pt will be independent with progression of HEP for decreased pain and improved functional strength, transfers, balance,  and gait.  TARGET 10/31/2020    Baseline 10/31/20: met with current program, will benefit from advancement as pt progresses    Status Achieved      PT SHORT TERM GOAL #2   Title Pt will improve 5x sit<>stand to less than or equal to 18 seconds for improved functional lower extremity strength.    Baseline 10/31/20: 15.88 sec's no UE support from standard height surface    Status Achieved      PT SHORT TERM GOAL #3   Title Pt will improve posterior push and release test to 2 steps or less for improved balance recovery.    Baseline 10/31/20: met in session today    Status Achieved      PT SHORT TERM GOAL #4   Baseline 7/1               PT Long Term Goals - 12/18/20 1206       PT LONG TERM GOAL #1   Title Pt will verbalize plans for continued community fitness/HEP to maximize gains made in PT.  TARGET 01/16/2021    Time 4    Period Weeks    Status On-going    Target Date 01/16/21      PT LONG TERM GOAL #2   Title Pt will subjectively report at least 25% improvement with sit to stand transfers.    Baseline --    Time 4    Period Weeks    Status New    Target Date 01/16/21      PT LONG TERM GOAL #3   Title Pt will improve MiniBESTest to at least 20/28 for decreased fall risk.    Baseline 17/28 10/09/20;    Time 8    Period Weeks    Status Deferred      PT LONG TERM GOAL #4   Title Pt will imrpove TUG score and TUG cog score to less than or equal to 10% difference  for improved dual tasking with gait.    Baseline 16.9, 23.56; TUG 13 sec, TUG cogn 15 seconds 10/09/20;    15.97 TUG ;   21.09 secs cog TUG (approx. 35% difference)    Time 8    Period Weeks    Status Not Met      PT LONG TERM GOAL #5   Title Pt will ambulate at least 20 minutes with appropriate standing rest breaks, without c/o increased pain or fatigue for improved gait indpedence in her neighborhood.    Baseline 10 minutes before having to stop; 10/09/20:  reports 15 minutes of walking in neighborhood, " a little pain", but I can handle it.    Time 8    Period Weeks    Status Not Met                   Plan - 12/19/20 1004     Clinical Impression Statement Pt continues to c/o Rt calf pain and discomfort -  has appt on 01-02-21 with Dr. Dagoberto Ligas for pain management.  Pt has not met any of the initial 5 LTG's but has improved with the TUG score and has improved with 5x sit to stand score but not to stated goal.  LTG is ongoing as pt states she has joined [facility] but has not started aquatic classes yet; LTG's #2, 4 and 5 are not met and LTG #3 deferred at this time.  Pt will benefit from continued aquatic therapy to address RLE pain, gait and balance deficits.    Personal Factors and Comorbidities Comorbidity 3+    Comorbidities asthma, GERD, breast cancer, HTN, cevical spondylosis, lumbar radiculopathy, OA of bilat knees, hx of donating kidney to husband    Examination-Activity Limitations Locomotion Level;Transfers;Stand    Examination-Participation Restrictions Church;Community Activity;Meal Prep    PT Frequency 2x / week    PT Duration 8 weeks   per recert, including 4/31/5400 week   PT Treatment/Interventions ADLs/Self Care Home Management;Aquatic Therapy;DME Instruction;Neuromuscular re-education;Balance training;Therapeutic exercise;Therapeutic activities;Functional mobility training;Gait training;Patient/family education;Manual techniques;Passive range of motion    PT Next Visit Plan  Cont aquatic therapy for 4 additional sessions    PT Home Exercise Plan Access Code: [email or web address]    Consulted and Agree with Plan of Care Patient             Patient will benefit from skilled therapeutic intervention in order to improve the following deficits and impairments:  Abnormal gait, Decreased range of motion, Pain, Decreased balance, Impaired flexibility, Decreased mobility, Decreased strength, Postural dysfunction  Visit Diagnosis: Other abnormalities of gait and mobility - Plan: PT plan of care cert/re-cert  Unsteadiness on feet - Plan: PT plan of care cert/re-cert  Muscle weakness (generalized) - Plan: PT plan of care cert/re-cert  Pain in right lower leg - Plan: PT plan of care cert/re-cert     Problem List Patient Active Problem List   Diagnosis Date Noted   PSVT (paroxysmal supraventricular tachycardia) ([place]) 11/26/2020   Syncope and collapse 11/24/2020   Primary osteoarthritis of right knee 05/12/2020   Primary osteoarthritis of left knee 05/12/2020   Hyperlipidemia    Bilateral calf pain 03/05/2019   Genetic testing 02/13/2019   Family history of ovarian cancer    Family history of bladder cancer    Family history of colon cancer    Family history of leukemia    Lumbar radiculopathy 12/08/2018   Myofascial pain 12/08/2018   Impaired gait and mobility 12/08/2018   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   Chronic nonallergic rhinitis 12/29/2016   Mild persistent asthma, uncomplicated 86/76/1950   Vocal fold paralysis, 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 [place], [place], [address] Phone: [phone]   Fax:  [phone]  Name: April Church MRN: [id number] Date of Birth: [birth year]

## 2020-12-22 ENCOUNTER — Other Ambulatory Visit: Payer: Self-pay

## 2020-12-22 ENCOUNTER — Ambulatory Visit: Payer: Medicare Other | Admitting: Physical Therapy

## 2020-12-22 ENCOUNTER — Encounter: Payer: Self-pay | Admitting: Physical Therapy

## 2020-12-22 DIAGNOSIS — R2681 Unsteadiness on feet: Secondary | ICD-10-CM | POA: Diagnosis not present

## 2020-12-22 DIAGNOSIS — M79662 Pain in left lower leg: Secondary | ICD-10-CM | POA: Diagnosis not present

## 2020-12-22 DIAGNOSIS — R2689 Other abnormalities of gait and mobility: Secondary | ICD-10-CM | POA: Diagnosis not present

## 2020-12-22 DIAGNOSIS — M79661 Pain in right lower leg: Secondary | ICD-10-CM | POA: Diagnosis not present

## 2020-12-22 DIAGNOSIS — R293 Abnormal posture: Secondary | ICD-10-CM | POA: Diagnosis not present

## 2020-12-22 DIAGNOSIS — M6281 Muscle weakness (generalized): Secondary | ICD-10-CM

## 2020-12-22 NOTE — Therapy (Signed)
[facility] [address] [place], [place], [address] Phone: [phone]   Fax:  [phone]  Physical Therapy Treatment  Patient Details  Name: April Church MRN: [id number] Date of Birth: 05/27/1940 Referring Provider (PT): Dr. Courtney Heys   Encounter Date: 12/22/2020   PT End of Session - 12/22/20 2123     Visit Number 24    Number of Visits 27    Date for PT Re-Evaluation 01/16/21    Authorization Type Medicare    Authorization Time Period 12-18-20 - 02-21-21    Progress Note Due on Visit 30    PT Start Time 1402    PT Stop Time 1445    PT Time Calculation (min) 43 min    Equipment Utilized During Treatment --   foam noodles, single bar bells,   Activity Tolerance Patient tolerated treatment well    Behavior During Therapy [facility] for tasks assessed/performed             Past Medical History:  Diagnosis Date   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 2011- no further tx. now- [name] , [name]   Cyst of finger 11/2011   annular cyst right long finger   Dental crowns present    Dermatitis    Frequency of urination    GERD (gastroesophageal reflux disease)    Hemorrhoid    History of colon polyps 2009   Also noted 2012 and 2017 by colonoscopy.   History of shingles 1971   Had right ischial recurrence in the March 2019   Hyperlipidemia    On atorvastatin   Hypertension    under control, has been on med. x 2 yrs.   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 Leta Baptist), neurosurgeon Dr. Arnoldo Morale   PONV (postoperative nausea and vomiting)    Seasonal allergies    Shingles of eyelid 1971   Right eyelid; recurrent -> last episode 05/11/2017   Tremors of nervous system    hands-essential  tremor; associated with Parkinson's.   Trigger finger of right hand 11/2011   long finger    Past Surgical History:  Procedure Laterality Date   14-day Zio Patch Monitor  08/2020   (report to be scanned): Predominant SR w/ HR 61 to 142 bpm and average 86 bpm.  Total of 59 episodes of PAT/PSVT  4 to 15 beats (not noted on diary).  Fastest was 5 beats at a rate of 193 bpm, longest was 15 beats at a rate of 106 bpm.  Rare isolated PACs (as well as couplets and triplets) with rare isolated PVCs.  No sustained arrhythmias or bradycardia to explain syncope.   ANTERIOR CERVICAL DECOMPRESSION/DISCECTOMY FUSION 4 LEVELS N/A 11/14/2013   Procedure: ANTERIOR CERVICAL DECOMPRESSION/DISCECTOMY FUSION 4 LEVELS;  Surgeon: Newman Pies, MD;  Location: [facility];  Service: Neurosurgery;  Laterality: N/A;  C34 C45 C56 C67 anterior cervical fusion with interbody prosthesis plating and  bonegraft   APPENDECTOMY  age 21   BREAST LUMPECTOMY  06/16/2009   left; SLN bx.   BREAST LUMPECTOMY  07/01/2009   re-excision   BREAST SURGERY  02/22/1997   reduction   CATARACT EXTRACTION, BILATERAL     COLONOSCOPY WITH PROPOFOL N/A 12/03/2014   Procedure: COLONOSCOPY WITH  PROPOFOL;  Surgeon: [name], MD;  Location: [facility];  Service: Endoscopy;  Laterality: N/A;   FOOT SURGERY  06/22/2011   left   KNEE ARTHROSCOPY  03/12/2005   right   KNEE ARTHROSCOPY     left   NASAL SINUS SURGERY     x 2   NEPHRECTOMY LIVING DONOR Left 04/22/2008   donated to spouse 2010([facility])   TRIGGER FINGER RELEASE  12/16/2011   Procedure: RELEASE TRIGGER FINGER/A-1 PULLEY;  Surgeon: Tennis Must, MD;  Location: [facility];  Service: Orthopedics;  Laterality: Right;  RIGHT LONG FINGER TRIGGER RELEASE & ANNULAR CYST EXCISION   TUMOR EXCISION  age 20   right arm    There were no vitals filed for this visit.   Subjective Assessment - 12/22/20 2119     Subjective Today's skilled session continued to  focus on stretching, gait and balance in the aquatice setting with no issues noted or reported in session. The pt is making steady progress toward goals and should benefit from continued PT to progress toward unmet goals.    Currently in Pain? Yes    Pain Score 4     Pain Location Calf    Pain Orientation Right    Pain Descriptors / Indicators Aching;Sore;Discomfort    Pain Type Chronic pain    Pain Onset More than a month ago    Pain Frequency Intermittent    Aggravating Factors  unsure    Pain Relieving Factors stretching, pain medication              Aquatic therapy at [facility] - pool temp 96 degrees   Patient seen for aquatic therapy today.  Treatment took place in water 3.5-4.5 feet deep depending upon activity.  Pt entered and  Exited the pool via step negotiation with use of bil. Hands rails, step by step with descension and ascension.   Min guard assist to walk over to bench in water.  Seated in long sitting- passive heel cord and hamstring stretching. Then with blue pool noodle foam roll to right calf for 2-3 minutes for decreased pain and muscle tightness. Then seated at edge of bench with feet parallel- sit<>stands for 10 reps, then in staggered stance x 10 reps each foot forward with no UE support.   In ~3.5-4.0 foot water with no AD across pool, min guard to min assist for balance Forward walking working on increased step length with heel strike for 4 laps across<>back Backward walking working on increased step length with toe>heel stepping pattern for 4 laps across<>back Side stepping left<>right working on step length and weight shifting for 4 laps each way.    In ~3.5-4.0 foot water with blue pool noodle, min guard to min assist for balance Toe walking across pool for 2 laps across<>back with cues on form Tandem gait for 2 laps across<>back. Pt unable to fully achieve heel>toe, more modified tandem with walking Heel walking across pool for 2 laps across<>back   Forward gait with upper trunk rotation left<>right for 4 laps with cues on technique Forward marching while binging noodle down to knee with marching for 4 laps with cues on form and technique  With single bar bells in ~4.0 water Forward walking with alternating punching for 4 laps with cues on form Side stepping with shoulder abduction/adduction for 3 laps toward each side with cues on form/technique Static standing with wide parallel stance: shoulder horizontal abduction/adduction at water level for ~1 minute, with arms extended  out to sides for shoulder adduction/abduction for 1 minute and then with pt holding arms down at sides with bar bells under water for cross country skiing for ~1 minute. Up to min assist needed for balance with cues on form and abdominal bracing.  Then with UE's extended out in front of pt- alternating lateral stepping/mini lunge with simultaneous shoulder horizontal abduction for 10 reps each side with cues on technique and form. Min guard to min assist for balance.      Pt requires buoyancy of water for support for reduced fall risk with gait training and balance exercises with minimal UE support; exercises able to be performed safely in water without the risk of fall compared to those same exercises performed on land;  viscosity of water needed for resistance for strengthening.  Current of water provides perturbations for challenging static & dynamic standing balance.           PT Short Term Goals - 12/19/20 8299       PT SHORT TERM GOAL #1   Title Pt will be independent with progression of HEP for decreased pain and improved functional strength, transfers, balance,  and gait.  TARGET 10/31/2020    Baseline 10/31/20: met with current program, will benefit from advancement as pt progresses    Status Achieved      PT SHORT TERM GOAL #2   Title Pt will improve 5x sit<>stand to less than or equal to 18 seconds for improved functional lower extremity strength.     Baseline 10/31/20: 15.88 sec's no UE support from standard height surface    Status Achieved      PT SHORT TERM GOAL #3   Title Pt will improve posterior push and release test to 2 steps or less for improved balance recovery.    Baseline 10/31/20: met in session today    Status Achieved      PT SHORT TERM GOAL #4   Baseline 7/1               PT Long Term Goals - 12/18/20 1206       PT LONG TERM GOAL #1   Title Pt will verbalize plans for continued community fitness/HEP to maximize gains made in PT.  TARGET 01/16/2021    Time 4    Period Weeks    Status On-going    Target Date 01/16/21      PT LONG TERM GOAL #2   Title Pt will subjectively report at least 25% improvement with sit to stand transfers.    Baseline --    Time 4    Period Weeks    Status New    Target Date 01/16/21      PT LONG TERM GOAL #3   Title Pt will improve MiniBESTest to at least 20/28 for decreased fall risk.    Baseline 17/28 10/09/20;    Time 8    Period Weeks    Status Deferred      PT LONG TERM GOAL #4   Title Pt will imrpove TUG score and TUG cog score to less than or equal to 10% difference for improved dual tasking with gait.    Baseline 16.9, 23.56; TUG 13 sec, TUG cogn 15 seconds 10/09/20;    15.97 TUG ;   21.09 secs cog TUG (approx. 35% difference)    Time 8    Period Weeks    Status Not Met      PT LONG TERM GOAL #5   Title  Pt will ambulate at least 20 minutes with appropriate standing rest breaks, without c/o increased pain or fatigue for improved gait indpedence in her neighborhood.    Baseline 10 minutes before having to stop; 10/09/20:  reports 15 minutes of walking in neighborhood, " a little pain", but I can handle it.    Time 8    Period Weeks    Status Not Met                   Plan - 12/22/20 2124     Clinical Impression Statement Today's skilled session continued to focus on strengthening, gait and balance in the aquatic setting. No issues noted or reported in  session. The pt is making steady progress toward goals and should benefit from continued PT to progress toward unmet goals.    Personal Factors and Comorbidities Comorbidity 3+    Comorbidities asthma, GERD, breast cancer, HTN, cevical spondylosis, lumbar radiculopathy, OA of bilat knees, hx of donating kidney to husband    Examination-Activity Limitations Locomotion Level;Transfers;Stand    Examination-Participation Restrictions Church;Community Activity;Meal Prep    PT Frequency 2x / week    PT Duration 8 weeks   per recert, including 06/26/6977 week   PT Treatment/Interventions ADLs/Self Care Home Management;Aquatic Therapy;DME Instruction;Neuromuscular re-education;Balance training;Therapeutic exercise;Therapeutic activities;Functional mobility training;Gait training;Patient/family education;Manual techniques;Passive range of motion    PT Next Visit Plan Cont aquatic therapy through plan of care    PT Home Exercise Plan Access Code: [email or web address]    Consulted and Agree with Plan of Care Patient             Patient will benefit from skilled therapeutic intervention in order to improve the following deficits and impairments:  Abnormal gait, Decreased range of motion, Pain, Decreased balance, Impaired flexibility, Decreased mobility, Decreased strength, Postural dysfunction  Visit Diagnosis: Other abnormalities of gait and mobility  Unsteadiness on feet  Muscle weakness (generalized)     Problem List Patient Active Problem List   Diagnosis Date Noted   PSVT (paroxysmal supraventricular tachycardia) ([place]) 11/26/2020   Syncope and collapse 11/24/2020   Primary osteoarthritis of right knee 05/12/2020   Primary osteoarthritis of left knee 05/12/2020   Hyperlipidemia    Bilateral calf pain 03/05/2019   Genetic testing 02/13/2019   Family history of ovarian cancer    Family history of bladder cancer    Family history of colon cancer    Family history of leukemia    Lumbar  radiculopathy 12/08/2018   Myofascial pain 12/08/2018   Impaired gait and mobility 12/08/2018   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   Chronic nonallergic rhinitis 12/29/2016   Mild persistent asthma, uncomplicated 48/02/6551   Vocal fold paralysis, 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

## 2020-12-23 ENCOUNTER — Encounter: Payer: Self-pay | Admitting: Diagnostic Neuroimaging

## 2020-12-23 ENCOUNTER — Ambulatory Visit (INDEPENDENT_AMBULATORY_CARE_PROVIDER_SITE_OTHER): Payer: Medicare Other | Admitting: Diagnostic Neuroimaging

## 2020-12-23 VITALS — BP 146/95 | HR 104 | Ht 60.0 in | Wt 124.6 lb

## 2020-12-23 DIAGNOSIS — G20 Parkinson's disease: Secondary | ICD-10-CM

## 2020-12-23 DIAGNOSIS — G250 Essential tremor: Secondary | ICD-10-CM

## 2020-12-23 DIAGNOSIS — M79604 Pain in right leg: Secondary | ICD-10-CM

## 2020-12-23 DIAGNOSIS — M79605 Pain in left leg: Secondary | ICD-10-CM

## 2020-12-23 NOTE — Progress Notes (Signed)
[facility]  PATIENT: April Church DOB: 29-Apr-1940  REFERRING CLINICIAN:  HISTORY FROM: patient REASON FOR VISIT: follow up    HISTORICAL  CHIEF COMPLAINT:  Chief Complaint  Patient presents with   Parkinson's disease    Rm 6 FU req for walking diffculty    HISTORY OF PRESENT ILLNESS:   UPDATE (12/23/20, VRP): Since last visit, still having leg pain (calves, aching pain). Now back in water exercises. Had BLE u/s, neg for DVT. Completed PT. PD stable.   UPDATE (10/13/20, VRP): Since last visit, tremor stable. Had 1 major fall 1 month ago; patient not sure about LOC. Getting heart workup. Increased primidone not helping. Seeing pain mgmt for back issues and leg pain issues.  UPDATE (10/10/19, VRP): Since last visit, tremor slightly progressing. Symptoms are moderate. Severity is moderate. No alleviating or aggravating factors. Tolerating medications.  Lumbar spine injections helping with low back pain.  UPDATE (01/09/19, VRP): Since last visit, doing well. Symptoms are stable. Severity is moderate. No alleviating or aggravating factors. Tolerating carb/levo 2 tabs three times a day. Planning for pain mgmt injections soon for low back pain.     UPDATE (01/31/18, VRP): Since last visit, doing about the same, except slightly more cramps and pain in arms and back. No alleviating or aggravating factors. Tolerating carb/levo.    UPDATE (05/23/17, VRP): Since last visit, doing well. Tolerating meds. No alleviating or aggravating factors. Tremor slightly worse. Some memory issues. Taking care of own ADLs. No major changes.  UPDATE 11/15/16: Since last visit, overall doing well. Tremor stable. Some memory loss issues continue, but not affecting ADLs. Tolerating meds. No falls. 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   UPDATE 05/10/16: Since last visit, stable. Some mild tremors in left hand. Mild balance issues. Mild memory issues. Cyclobenz helps with night  time spasms.   UPDATE 11/12/15: Since last visit, tremors are stable. Balance stable. Now with night time neck, shoulder, spine and leg pain (spasm). Had PT and OT evals as well.   UPDATE 05/13/15: Since last visit, tremor in arms better. Had ST and botox for voice issues. Some wearing off of meds before mid-day dosing. No neck pain issues.   UPDATE 02/12/15: Since last visit, tremor is stable. More voice strangulation issues. Tolerating primidone 100mg  TID.  UPDATE 08/13/14: Since last visit, tried primidone 250mg  BID, but not much tremor benefit; also having more sleepiness in the day time. Still with hand > voice > chin tremor. No falls. Balance getting slightly worse.  UPDATE 05/22/14: Since last visit, tremor progressing. Now notes new resting tremor in the last year. Some balance and walking issues. She is s/p cervical decompression in Sept 2015, with good results.   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 Her tremor does not limit any of her activities of daily life. She denies any side effects of the Mysoline. She also reports today that she's had some numbness and tingling down her right arm from her neck and shoulder. It has been bothering her more in the last 6 months. 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 She saw Dr. Arnoldo Morale back in 2004.  She returns for reevaluation  UPDATE 06/07/12 (VRP): Doing well. BUE tremor is stable and controlled. Voice tremor is slightly progressing. Tolerating primidone.  PRIOR HPI (06/09/11, VRP):  80 year old female returns for F/U.  Last seen 06/09/10.  She has been followed in this office for many years for essential tremor. She takes primidone 50 mg, 1.5 TID. She tolerates it well. She comes in today saying that her tremor is a little worse before lunch.  The tremor limits her a little bit with eating and writing but for the most  part she is happy with her level of function.She has hx of breast cancer.  No neurologic complaints.    REVIEW OF SYSTEMS: Full 14 system review of systems performed and negative except: as per HPI.    ALLERGIES: Allergies  Allergen Reactions   Shellfish Allergy Shortness Of Breath   Duloxetine Itching and Rash   Sulfa Drugs Cross Reactors Anxiety and Rash   Lisinopril     Other reaction(s): cough   Lyrica [Pregabalin] Other (See Comments)    "made me drunk, couldn't function"   Other     Other reaction(s): hyper   Sulfa Antibiotics     Other reaction(s): hyper, break out in a rash   Codeine Anxiety    HOME MEDICATIONS: Outpatient Medications Prior to Visit  Medication Sig Dispense Refill   albuterol (VENTOLIN HFA) 108 (90 Base) MCG/ACT inhaler Inhale 1 puff into the lungs every 6 (six) hours as needed for wheezing or shortness of breath. 18 g 1   amLODipine (NORVASC) 5 MG tablet Take 1 tablet (5 mg total) by mouth daily. 90 tablet 2   atorvastatin (LIPITOR) 20 MG tablet Take 20 mg by mouth every other day.      azelastine (ASTELIN) 0.1 % nasal spray Place 1-2 sprays into both nostrils 2 (two) times daily. Use in each nostril as directed 30 mL 5   CALCIUM CARBONATE PO Take 1 tablet by mouth 2 (two) times daily.     carbidopa-levodopa (SINEMET IR) 25-100 MG tablet Take 2 tablets by mouth 3 (three) times daily. 540 tablet 4   Cholecalciferol (VITAMIN D) 1000 UNITS capsule Take 1,000 Units by mouth 2 (two) times daily.     Coenzyme Q10 (CO Q 10) 100 MG CAPS Take by mouth.     cyclobenzaprine (FLEXERIL) 5 MG tablet TAKE 1 TABLET BY MOUTH EVERY DAY AT BEDTIME AS NEEDED FOR  MUSCLE  SPASMS,  YOU  CAN  TAKE  UP  TO  2  TABLETS/10MG   MAX  IF  NEEDED. 50 tablet 11   EPINEPHrine 0.3 mg/0.3 mL IJ SOAJ injection Inject 0.3 mLs (0.3 mg total) into the muscle as needed. 2 each 1   famotidine (PEPCID) 20 MG tablet Take 1 tablet (20 mg total) by mouth daily. 30 tablet 5   fluticasone (FLOVENT  HFA) 110 MCG/ACT inhaler 1 puff     hydrOXYzine (ATARAX/VISTARIL) 10 MG tablet Take 10 mg by mouth at bedtime.     ipratropium (ATROVENT) 0.03 % nasal spray Place 2 sprays into both nostrils 2 (two) times daily. 30 mL 5   ketotifen (ZADITOR) 0.025 % ophthalmic solution      levocetirizine (XYZAL) 5 MG tablet TAKE 1 TABLET BY MOUTH ONCE DAILY IN THE EVENING . APPOINTMENT REQUIRED FOR FUTURE REFILLS 30 tablet 4   Polyethyl Glycol-Propyl Glycol (SYSTANE OP) Place 1 drop into both eyes 2 (two) times daily.     primidone (MYSOLINE) 50 MG tablet Take 2 tablets (100 mg total) by mouth 3 (three) times daily. 540 tablet 4   SYMBICORT 160-4.5 MCG/ACT inhaler Inhale 2 puffs into the lungs 2 (two) times daily. 10.2 g 5   traMADol (ULTRAM)  50 MG tablet Take 1 tablet (50 mg total) by mouth every 12 (twelve) hours as needed. 30 tablet 1   triamcinolone cream (KENALOG) 0.1 % Apply 1 application topically 2 (two) times daily as needed (FOR RASH).     venlafaxine (EFFEXOR) 75 MG tablet Take 1 tablet (75 mg total) by mouth daily. 90 tablet 1   No facility-administered medications prior to visit.    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 2011- no further tx. now- [name] , [name]   Cyst of finger 11/2011   annular cyst right long finger   Dental crowns present    Dermatitis    Frequency of urination    GERD (gastroesophageal reflux disease)    Hemorrhoid    History of colon polyps 2009   Also noted 2012 and 2017 by colonoscopy.   History of shingles 1971   Had right ischial recurrence in the March 2019   Hyperlipidemia    On atorvastatin   Hypertension    under control, has been on med. x 2 yrs.   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 Leta Baptist), neurosurgeon Dr. Arnoldo Morale   PONV (postoperative nausea and vomiting)    Seasonal allergies    Shingles of eyelid 1971   Right eyelid; recurrent -> last episode 05/11/2017   Tremors of nervous system    hands-essential tremor; associated with Parkinson's.   Trigger finger of right hand 11/2011   long finger    PAST SURGICAL HISTORY: Past Surgical History:  Procedure Laterality Date   14-day Zio Patch Monitor  08/2020   (report to be scanned): Predominant SR w/ HR 61 to 142 bpm and average 86 bpm.  Total of 59 episodes of PAT/PSVT  4 to 15 beats (not noted on diary).  Fastest was 5 beats at a rate of 193 bpm, longest was 15 beats at a rate of 106 bpm.  Rare isolated PACs (as well as couplets and triplets) with rare isolated PVCs.  No sustained arrhythmias or bradycardia to explain syncope.   ANTERIOR CERVICAL DECOMPRESSION/DISCECTOMY FUSION 4 LEVELS N/A 11/14/2013   Procedure: ANTERIOR CERVICAL DECOMPRESSION/DISCECTOMY FUSION 4 LEVELS;  Surgeon: Newman Pies, MD;  Location: [place] [facility];  Service: Neurosurgery;  Laterality: N/A;  C34 C45 C56 C67 anterior cervical fusion with interbody prosthesis plating and  bonegraft   APPENDECTOMY  age 40   BREAST LUMPECTOMY  06/16/2009   left; SLN bx.   BREAST LUMPECTOMY  07/01/2009   re-excision   BREAST SURGERY  02/22/1997   reduction   CATARACT EXTRACTION, BILATERAL     COLONOSCOPY WITH PROPOFOL N/A 12/03/2014   Procedure: COLONOSCOPY WITH PROPOFOL;  Surgeon: [name], MD;  Location: [facility];  Service: Endoscopy;  Laterality: N/A;   FOOT SURGERY  06/22/2011   left   KNEE ARTHROSCOPY  03/12/2005   right   KNEE ARTHROSCOPY     left   NASAL SINUS SURGERY     x 2   NEPHRECTOMY LIVING DONOR Left 04/22/2008   donated to spouse 2010([facility])   TRIGGER FINGER RELEASE  12/16/2011   Procedure: RELEASE TRIGGER FINGER/A-1 PULLEY;  Surgeon: Tennis Must, MD;  Location: [place];  Service:  Orthopedics;  Laterality: Right;  RIGHT LONG FINGER TRIGGER RELEASE & ANNULAR CYST EXCISION   TUMOR EXCISION  age 89   right arm    FAMILY HISTORY: Family History  Problem Relation Age of Onset   Kidney disease Mother    Stroke Father    Stroke Sister        08/2015   Diabetes Brother    Heart disease Brother    Ovarian cancer Sister 22   Heart disease Paternal Grandmother    Heart disease Paternal Grandfather    Leukemia Other 53       brother's grandson   Bladder Cancer Brother 30   Colon cancer Niece        dx in late 28s    SOCIAL HISTORY:  Social History   Socioeconomic History   Marital status: Widowed    Spouse name: [name]-David   Number of children: 1   Years of education: HS   Highest education level: Not on file  Occupational History   Occupation: Retired   Occupation: Housewife  Tobacco Use   Smoking status: Never   Smokeless tobacco: Never  Substance and Sexual Activity   Alcohol use: No    Alcohol/week: 0.0 standard drinks   Drug use: No   Sexual activity: Not Currently  Other Topics Concern   Not on file  Social History Narrative   10/13/20    Recently widowed-lives alone, husband [name] passed 04/26/2020    Patient has 1 daughter who lives in [place], [place]-> 1 grandson who lives in [place]   Patient has a HS education.    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    Patient is right handed.    Patient drinks caffeine occasionally.     Social Determinants of Health   Financial Resource Strain: Not on file  Food Insecurity: Not on file  Transportation Needs: Not on file  Physical Activity: Not on file  Stress: Not on file  Social Connections: Not on file  Intimate Partner Violence: Not on file    PHYSICAL EXAM  Vitals:   12/23/20 1100  BP: (!) 146/95  Pulse: (!) 104  Weight: 124 lb 9.6 oz (56.5 kg)  Height: 5' (1.524 m)   Body mass index is 24.33 kg/m.  GENERAL EXAM: Patient is in no  distress  CARDIOVASCULAR: Regular rate and rhythm, no murmurs, no carotid bruits  NEUROLOGIC: MENTAL STATUS: awake, alert, language fluent, comprehension intact, naming intact; MASKED FACIES; NEG FRONTAL RELEASE SIGNS MMSE - Mini Mental State Exam 05/23/2017  Orientation to time 5  Orientation to Place 5  Registration 3  Attention/ Calculation 1  Recall 2  Language- name 2 objects 2  Language- repeat 1  Language- follow 3 step command 3  Language- read & follow direction 1  Write a sentence 1  Copy design 1  Total score 25    CRANIAL NERVE: pupils equal and reactive to light, visual fields full to confrontation, extraocular muscles intact, no nystagmus, facial sensation and strength symmetric, uvula midline, shoulder shrug symmetric, tongue midline; SOFT HOARSE VOICE MOTOR: normal bulk, POSTURAL AND ACTION TREMOR; RARE REST TREMOR IN RUE > LUE; INT MOUTH TREMOR; BRADYKINESIA IN BUE AND BLE; LEFT SLOWER THAN RIGHT; MILD COGWHEELING IN RUE > LUE; full strength in the BUE, BLE; VOICE TREMOR SENSORY: normal and symmetric to light touch COORDINATION: finger-nose-finger, fine finger movements normal REFLEXES: deep tendon reflexes present and symmetric GAIT/STATION: narrow based gait; SLOW AND CAUTIOUS,  DECR RIGHT ARM SWING    DIAGNOSTIC DATA (LABS, IMAGING, TESTING) - I reviewed patient records, labs, notes, testing and imaging myself where available.  Lab Results  Component Value Date   WBC 5.2 01/20/2015   HGB 12.0 01/20/2015   HCT 36.1 01/20/2015   MCV 88.4 01/20/2015   PLT 293 01/20/2015      Component Value Date/Time   NA 142 07/30/2020 1141   NA 136 01/20/2015 1326   K 4.5 07/30/2020 1141   K 4.1 01/20/2015 1326   CL 100 07/30/2020 1141   CL 102 06/30/2012 1231   CO2 25 07/30/2020 1141   CO2 27 01/20/2015 1326   GLUCOSE 89 07/30/2020 1141   GLUCOSE 66 (L) 01/20/2015 1326   GLUCOSE 77 06/30/2012 1231   BUN 11 07/30/2020 1141   BUN 13.9 01/20/2015 1326    CREATININE 0.65 07/30/2020 1141   CREATININE 0.9 01/20/2015 1326   CALCIUM 9.1 07/30/2020 1141   CALCIUM 8.8 01/20/2015 1326   PROT 7.4 01/20/2015 1326   ALBUMIN 3.6 01/20/2015 1326   AST 17 01/20/2015 1326   ALT 13 01/20/2015 1326   ALKPHOS 86 01/20/2015 1326   BILITOT <0.30 01/20/2015 1326   GFRNONAA 83 (L) 11/06/2013 1032   GFRAA >90 11/06/2013 1032   No results found for: CHOL No results found for: HGBA1C No results found for: VITAMINB12 No results found for: TSH   MRI brain - unremarkable per patient (done ~ early 2000's)  06/25/13 EMG/NCS 1. 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 2. No evidence of widespread underlying large fiber neuropathy.  11/14/13 CXR [I reviewed images myself and agree with interpretation. -VRP]  - Anterior cervical disc fusion localization images as described.  10/11/18 MRI lumbar spine  MRI lumbar spine (without) demonstrating: - At L4-5: disc bulging and facet hypertrophy with severe right and moderate left foraminal stenosis. - At L3-4: disc bulging and facet hypertrophy with moderate right and mild left foraminal stenosis. - At L5-S1: disc bulging and facet hypertrophy with mild right and moderate left foraminal stenosis.    ASSESSMENT AND PLAN  80 y.o. 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 here with  essential tremor since 1990's. Now with intermittent resting tremor AND bradykinesia since 2016. May represent essential tremor with superimposed parkinson's disease + cervical myelopathy sequelae. DATscan ordered but denied (to confirm superimposed parkinsonism on top of essential tremor and cervical myelopathy).  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)  Parkinson's disease ([facility])  Essential tremor  Spinal stenosis of lumbosacral region     PLAN:  LEG PAIN (aching, knees, calves) - due to lumbar radiculopathies, arthritis (knees), deconditioning, parkinson's dz - continue PT exercises; planning to transition to [facility] exercise  PARKINSONISM  - continue carb/levo 2 tabs three times a day - caution with walking and balance; use cane / walker as needed - [facility]; after weighing benefits and risks, patient holding off for now due to some concern of potential complications  ESSENTIAL TREMOR - reduce primidone to 100mg  three times a day - consider DBS as above  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     Penni Bombard, MD 65/08/8467, 62:95 AM Certified in Neurology, Neurophysiology and Neuroimaging  [facility] [address] [place], [place] [address] [phone]

## 2020-12-26 ENCOUNTER — Encounter: Payer: Self-pay | Admitting: Cardiology

## 2020-12-26 ENCOUNTER — Ambulatory Visit (INDEPENDENT_AMBULATORY_CARE_PROVIDER_SITE_OTHER): Payer: Medicare Other | Admitting: Cardiology

## 2020-12-26 ENCOUNTER — Other Ambulatory Visit: Payer: Self-pay

## 2020-12-26 VITALS — BP 126/78 | HR 105 | Ht 60.0 in | Wt 126.8 lb

## 2020-12-26 DIAGNOSIS — R55 Syncope and collapse: Secondary | ICD-10-CM | POA: Diagnosis not present

## 2020-12-26 DIAGNOSIS — E7849 Other hyperlipidemia: Secondary | ICD-10-CM

## 2020-12-26 DIAGNOSIS — M79662 Pain in left lower leg: Secondary | ICD-10-CM | POA: Diagnosis not present

## 2020-12-26 DIAGNOSIS — I471 Supraventricular tachycardia: Secondary | ICD-10-CM | POA: Diagnosis not present

## 2020-12-26 DIAGNOSIS — M79661 Pain in right lower leg: Secondary | ICD-10-CM

## 2020-12-26 MED ORDER — METOPROLOL SUCCINATE ER 25 MG PO TB24: 25.0000 mg | ORAL_TABLET | Freq: Every day | ORAL | 3 refills | Status: DC

## 2020-12-26 MED ORDER — AMLODIPINE BESYLATE 2.5 MG PO TABS: 2.5000 mg | ORAL_TABLET | Freq: Every day | ORAL | 3 refills | Status: DC

## 2020-12-26 NOTE — Patient Instructions (Signed)
Medication Instructions:    DECREASE  AMLODIPINE 2.5 MG  ( 1/2 TABLET OF 5 MG)   DAILY    TAKE AT BEDTIME  METOPROLOL SUCCINATE  25 MG  ONE TABLET  *If you need a refill on your cardiac medications before your next appointment, please call your pharmacy*   Lab Work:  NOT NEEDED   Testing/Procedures:  NOT NEEDED  Follow-Up: At [facility], you and your health needs are our priority.  As part of our continuing mission to provide you with exceptional heart care, we have created designated Provider Care Teams.  These Care Teams include your primary Cardiologist (physician) and Advanced Practice Providers (APPs -  Physician Assistants and Nurse Practitioners) who all work together to provide you with the care you need, when you need it.     Your next appointment:   6 month(s)  The format for your next appointment:   In Person  Provider:   Glenetta Hew, MD

## 2020-12-26 NOTE — Progress Notes (Signed)
Primary Care Provider: Leeroy Cha, [facility] Cardiologist: Glenetta Hew, MD Electrophysiologist: None Neurologist: Dr. Andrey Spearman Neurosurgeon: Dr. Arnoldo Morale  Clinic Note: Chief Complaint  Patient presents with   Follow-up    Test result   Syncope    1 additional passout spell, no arrhythmia on monitor.     ===================================  ASSESSMENT/PLAN   Problem List Items Addressed This Visit       Cardiology Problems   Hyperlipidemia (Chronic)    Most recent LDL is 112 on current dose of atorvastatin. With advanced age and musculoskeletal issues and borderline memory issues, would avoid titrating further for now, but may consider switching to rosuvastatin 20 mg to avoid increasing dose.      Relevant Medications   metoprolol succinate (TOPROL-XL) 25 MG 24 hr tablet   PSVT (paroxysmal supraventricular tachycardia) (HCC) (Chronic)    Short-lived episodes noted noted on monitor, but somewhat frequent. With the concern for possible hypotension, I would reduce her amlodipine dose to 2.5 mg which will allow me to add low-dose Toprol to take at night.  Also discussed avoiding triggers and vagal maneuvers.      Relevant Medications   metoprolol succinate (TOPROL-XL) 25 MG 24 hr tablet     Other   Bilateral calf pain (Chronic)    Mild cramping.  No significant venous reflux just minimal 1 for unilateral reflux. Excellent with her having some neurocardiogenic blood pressure changes and orthostatic symptoms, would not be unreasonable to wear support/compression stockings.      Syncope and collapse - Primary (Chronic)    Now has had a second episode where she fell on it did not seem at all associated with an arrhythmia.  Nothing noted on monitor, with relatively normal echocardiogram and venous duplex..  At this point, must assume that is probably orthostatic related.  I doubt that this is related to SVT because her heart rate was fast  enough longer not lasting.  Unlikely that she had a bradycardic episode.  Plan: Reduce amlodipine to 2.5 mg and add low-dose Toprol 25 mg daily.  She will start 12.5 mg daily and then increase to full 25 mg after a week or 2.      ===================================  HPI:    April Church is a 80 y.o. female with a PMH notable for Hyperlipidemia, Hypertension, Parkinson's Disease (on Carbidopa-levodopa, and primidone), and History of Breast Cancer (April 2011) who is being seen today for the evaluation of SYNCOPE at the request of Leeroy Cha,*.  April Church is the widow of April Church who was a long-term patient of mine.  He passed on 05/12/2020.  Referred after October 09, 2019 visit in response to an episode of syncope on July 14.  Was standing up and fell backwards, hit her head.  No witnesses.  14-day Zio patch monitor only showed  April Church was seen on November 24, 2020 in consultation.  Unlikely that her episode of syncope was related to short bursts of 15 beats PSVT.  In fact, she had an episode of falling while wearing the monitor and did not have an arrhythmia at that time.  She now has an apple watch to watch for arrhythmias.  Has been evaluated and treated by physical and Occupational Therapy.  There was concern that she had not been eating and drinking well.  Also had primidone dose adjusted.  Mild edema [place] 5 with -> Lower extremity venous Dopplers ordered.  Recent Hospitalizations: None  Reviewed  CV studies:    The following studies were reviewed today: (if available, images/films reviewed: From Epic Chart or Care Everywhere) TTE (12/10/2020): EF 60 to 65%.  Normal LV size and function.  No heart WMA.  GR 1 DD.  Normal RV, RVP and RAP.[name]  Normal valves. Lower Extremity Venous Dopplers (12/12/2020): No DVT bilaterally in the deep veins or superficial veins.  No deep or superficial venous reflux noted bilaterally with exception of Right SSV at the  knee.  Interval History:   April Church presents now for follow-up.  She has had poor balance.  She has had a couple more falls now not related makes cardiac etiology.  Has not had any evidence of arrhythmia or bradycardia.  She is undergoing physical therapy for poor balance.  There is concern about some of the dizziness and lack of position since being occasionally associated with orthostasis.  Otherwise regarding standpoint she is quite stable.  She has trivial edema, no chest pain or pressure exertion.  Short little fluttering, but nothing prolonged.  She has had 1 pretty significant fall without loss of consciousness since the last visit, but otherwise stable.  No true syncope, TIA or amaurosis fugax.  Also some mild orthostatic dizziness.  CV Review of Symptoms (Summary) Cardiovascular ROS: positive for - loss of consciousness and Only to one occasion noted above-not associated with any sensation of dizziness lightheadedness or tachycardia.  No chest pain or pressure.  She does have some intermittent Orthostatic dizziness symptoms negative for - chest pain, dyspnea on exertion, edema, irregular heartbeat, orthopnea, palpitations, paroxysmal nocturnal dyspnea, rapid heart rate, shortness of breath, or further episodes of syncope or near syncope, TIA or amaurosis fugax.  No claudication.  REVIEWED OF SYSTEMS   Review of Systems  Constitutional:  Positive for malaise/fatigue (Brought in because of fear of doing too much exercise, she is very deconditioned and has less energy.  Energy is down, but not overly significant.). Negative for weight loss.  HENT:  Negative for congestion (Can get congested with wearing a mask all day) and nosebleeds.   Respiratory:  Negative for cough and shortness of breath (Only when wearing a mask all day long and try to be active.).   Cardiovascular:  Positive for palpitations. Negative for orthopnea, claudication and leg swelling.       See HPI -=> no  prolonged episodes of palpitations or dizziness.  Gastrointestinal:  Negative for abdominal pain, blood in stool and melena.  Genitourinary:  Negative for dysuria, flank pain and frequency.  Musculoskeletal:  Positive for falls (Only the 1 fall associated with syncope.  That was in July.  None since then.) and joint pain (Mild arthralgias.).  Neurological:  Positive for dizziness (On occasion, with orthostasis, but has not ever had a collapse episode except for the 22 August 2020). Negative for speech change, focal weakness, weakness and headaches.  Psychiatric/Behavioral:  Negative for depression and memory loss. The patient is nervous/anxious. The patient does not have insomnia.    I have reviewed and (if needed) personally updated the patient's problem list, medications, allergies, past medical and surgical history, social and family history.   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 2011- no further tx. now- [name] , [name]   Cyst  of finger 11/2011   annular cyst right long finger   Dental crowns present    Dermatitis    Frequency of urination    GERD (gastroesophageal reflux disease)    Hemorrhoid    History of colon polyps 2009   Also noted 2012 and 2017 by colonoscopy.   History of shingles 1971   Had right ischial recurrence in the March 2019   Hyperlipidemia    On atorvastatin   Hypertension    under control, has been on med. x 2 yrs.   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 Leta Baptist), neurosurgeon Dr. Arnoldo Morale   PONV (postoperative nausea and vomiting)    Seasonal allergies    Shingles of eyelid 1971   Right eyelid; recurrent -> last episode 05/11/2017   Tremors of nervous system     hands-essential tremor; associated with Parkinson's.   Trigger finger of right hand 11/2011   long finger    PAST SURGICAL HISTORY   Past Surgical History:  Procedure Laterality Date   14-day Zio Patch Monitor  08/2020   (report to be scanned): Predominant SR w/ HR 61 to 142 bpm and average 86 bpm.  Total of 59 episodes of PAT/PSVT  4 to 15 beats (not noted on diary).  Fastest was 5 beats at a rate of 193 bpm, longest was 15 beats at a rate of 106 bpm.  Rare isolated PACs (as well as couplets and triplets) with rare isolated PVCs.  No sustained arrhythmias or bradycardia to explain syncope.   ANTERIOR CERVICAL DECOMPRESSION/DISCECTOMY FUSION 4 LEVELS N/A 11/14/2013   Procedure: ANTERIOR CERVICAL DECOMPRESSION/DISCECTOMY FUSION 4 LEVELS;  Surgeon: Newman Pies, MD;  Location: [facility];  Service: Neurosurgery;  Laterality: N/A;  C34 C45 C56 C67 anterior cervical fusion with interbody prosthesis plating and  bonegraft   APPENDECTOMY  age 42   BREAST LUMPECTOMY  06/16/2009   left; SLN bx.   BREAST LUMPECTOMY  07/01/2009   re-excision   BREAST SURGERY  02/22/1997   reduction   CATARACT EXTRACTION, BILATERAL     COLONOSCOPY WITH PROPOFOL N/A 12/03/2014   Procedure: COLONOSCOPY WITH PROPOFOL;  Surgeon: [name], MD;  Location: [facility];  Service: Endoscopy;  Laterality: N/A;   FOOT SURGERY  06/22/2011   left   KNEE ARTHROSCOPY  03/12/2005   right   KNEE ARTHROSCOPY     left   NASAL SINUS SURGERY     x 2   NEPHRECTOMY LIVING DONOR Left 04/22/2008   donated to spouse 2010([facility])   TRIGGER FINGER RELEASE  12/16/2011   Procedure: RELEASE TRIGGER FINGER/A-1 PULLEY;  Surgeon: Tennis Must, MD;  Location: [facility];  Service: Orthopedics;  Laterality: Right;  RIGHT LONG FINGER TRIGGER RELEASE & ANNULAR CYST EXCISION   TUMOR EXCISION  age 41   right arm    Immunization History  Administered Date(s) Administered   Influenza,inj,Quad PF,6+ Mos 11/16/2013    Influenza-Unspecified 11/21/2019   PFIZER Comirnaty(Gray Top)Covid-19 Tri-Sucrose Vaccine 03/14/2019, 04/04/2019, 10/22/2019   Pneumococcal Conjugate-13 05/29/2013   Pneumococcal Polysaccharide-23 12/12/2015   Tdap 04/23/2013    MEDICATIONS/ALLERGIES   Current Meds  Medication Sig   albuterol (VENTOLIN HFA) 108 (90 Base) MCG/ACT inhaler Inhale 1 puff into the lungs every 6 (six) hours as needed for wheezing or shortness of breath.   amLODipine (NORVASC) 5 MG tablet Take 1 tablet (5 mg total) by mouth daily.   atorvastatin (  LIPITOR) 20 MG tablet Take 20 mg by mouth every other day.    azelastine (ASTELIN) 0.1 % nasal spray Place 1-2 sprays into both nostrils 2 (two) times daily. Use in each nostril as directed   CALCIUM CARBONATE PO Take 1 tablet by mouth 2 (two) times daily.   carbidopa-levodopa (SINEMET IR) 25-100 MG tablet Take 2 tablets by mouth 3 (three) times daily.   Cholecalciferol (VITAMIN D) 1000 UNITS capsule Take 1,000 Units by mouth 2 (two) times daily.   Coenzyme Q10 (CO Q 10) 100 MG CAPS Take by mouth.   cyclobenzaprine (FLEXERIL) 5 MG tablet TAKE 1 TABLET BY MOUTH EVERY DAY AT BEDTIME AS NEEDED FOR  MUSCLE  SPASMS,  YOU  CAN  TAKE  UP  TO  2  TABLETS/10MG   MAX  IF  NEEDED.   EPINEPHrine 0.3 mg/0.3 mL IJ SOAJ injection Inject 0.3 mLs (0.3 mg total) into the muscle as needed.   famotidine (PEPCID) 20 MG tablet Take 1 tablet (20 mg total) by mouth daily.   fluticasone (FLOVENT HFA) 110 MCG/ACT inhaler 1 puff   ipratropium (ATROVENT) 0.03 % nasal spray Place 2 sprays into both nostrils 2 (two) times daily.   ketotifen (ZADITOR) 0.025 % ophthalmic solution    levocetirizine (XYZAL) 5 MG tablet TAKE 1 TABLET BY MOUTH ONCE DAILY IN THE EVENING . APPOINTMENT REQUIRED FOR FUTURE REFILLS   Polyethyl Glycol-Propyl Glycol (SYSTANE OP) Place 1 drop into both eyes 2 (two) times daily.   primidone (MYSOLINE) 50 MG tablet Take 2 tablets (100 mg total) by mouth 3 (three) times daily.    SYMBICORT 160-4.5 MCG/ACT inhaler Inhale 2 puffs into the lungs 2 (two) times daily.   traMADol (ULTRAM) 50 MG tablet Take 1 tablet (50 mg total) by mouth every 12 (twelve) hours as needed.   triamcinolone cream (KENALOG) 0.1 % Apply 1 application topically 2 (two) times daily as needed (FOR RASH).   venlafaxine (EFFEXOR) 75 MG tablet Take 1 tablet (75 mg total) by mouth daily.    Allergies  Allergen Reactions   Shellfish Allergy Shortness Of Breath   Duloxetine Itching and Rash   Sulfa Drugs Cross Reactors Anxiety and Rash   Lisinopril     Other reaction(s): cough Other reaction(s): cough   Lyrica [Pregabalin] Other (See Comments)    "made me drunk, couldn't function"   Other     Other reaction(s): hyper   Sulfa Antibiotics     Other reaction(s): hyper, break out in a rash Other reaction(s): hyper, break out in a rash   Codeine Anxiety    Other reaction(s): hyper    SOCIAL HISTORY/FAMILY HISTORY   Reviewed in Epic:   Social History   Tobacco Use   Smoking status: Never   Smokeless tobacco: Never  Vaping Use   Vaping Use: Never used  Substance Use Topics   Alcohol use: No    Alcohol/week: 0.0 standard drinks   Drug use: No   Social History   Social History Narrative   10/13/20    Recently widowed-lives alone, husband April Church) passed 04/26/2020    Patient has 1 daughter who lives in [place], [place]-> 1 grandson who lives in [place]   Patient has a HS education.    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    Patient is right handed.    Patient drinks caffeine occasionally.     Family History  Problem Relation Age of Onset   Kidney disease Mother  Stroke Father    Stroke Sister        08/2015   Diabetes Brother    Heart disease Brother    Ovarian cancer Sister 7   Heart disease Paternal Grandmother    Heart disease Paternal Grandfather    Leukemia Other 9       brother's grandson   Bladder Cancer Brother 66   Colon cancer Niece         dx in late 74s    OBJCTIVE -PE, EKG, labs   Wt Readings from Last 3 Encounters:  01/02/21 124 lb (56.2 kg)  12/26/20 126 lb 12.8 oz (57.5 kg)  12/23/20 124 lb 9.6 oz (56.5 kg)    Physical Exam: BP 126/78 (BP Location: Right Arm, Patient Position: Sitting, Cuff Size: Normal)   Pulse (!) 105   Ht 5' (1.524 m)   Wt 126 lb 12.8 oz (57.5 kg)   SpO2 96%   BMI 24.76 kg/m  Physical Exam Vitals reviewed.  Constitutional:      General: She is not in acute distress.    Appearance: Normal appearance. She is not ill-appearing, toxic-appearing or diaphoretic.     Comments: Thin, frail elderly woman no acute distress.  Baseline tremor.   HENT:     Head: Normocephalic and atraumatic.  Neck:     Vascular: No carotid bruit.  Cardiovascular:     Rate and Rhythm: Normal rate and regular rhythm.     Pulses: Normal pulses.     Heart sounds: No murmur heard.   No gallop.     Comments: Borderline tachycardic Pulmonary:     Effort: Pulmonary effort is normal. No respiratory distress.     Breath sounds: Normal breath sounds.  Chest:     Chest wall: No tenderness.  Abdominal:     General: Abdomen is flat.     Palpations: Mass: No HSM or bruits.  Musculoskeletal:        General: No swelling (Trivial bilateral ankle swelling.) or tenderness.     Cervical back: Normal range of motion and neck supple. No rigidity.  Skin:    General: Skin is warm and dry.  Neurological:     General: No focal deficit present.     Mental Status: She is alert and oriented to person, place, and time.     Cranial Nerves: No cranial nerve deficit.     Gait: Gait abnormal (Unsteady/shuffling gait.).     Comments: April Church has mild baseline R>L hand pill-rolling tremor.  Chronic coarse speech with some hesitated/stuttered speech.  This is baseline.[name]  Psychiatric:        Mood and Affect: Mood normal.        Behavior: Behavior normal.        Thought Content: Thought content normal.        Judgment: Judgment  normal.     Adult ECG Report  Rate: 98;  Rhythm: normal sinus rhythm and Low voltage.  Otherwise normal axis, intervals and durations. ;   Narrative Interpretation: Stable/normal  Recent Labs: Reviewed.  06/05/2020: TC 187, TG 127, HDL 64, LDL 112; Hgb 12.2,Cr 0.65, K+ 4.5. Lab Results  Component Value Date   CREATININE 0.65 07/30/2020   BUN 11 07/30/2020   NA 142 07/30/2020   K 4.5 07/30/2020   CL 100 07/30/2020   CO2 25 07/30/2020   CBC Latest Ref Rng & Units 01/20/2015 01/21/2014 11/14/2013  WBC 3.9 - 10.3 10e3/uL 5.2 5.8 -  Hemoglobin 11.6 - 15.9 g/dL  12.0 10.5(L) 9.5(L)  Hematocrit 34.8 - 46.6 % 36.1 32.7(L) 28.0(L)  Platelets 145 - 400 10e3/uL 293 384 -    No results found for: HGBA1C No results found for: TSH  ==================================================  COVID-19 Education: The signs and symptoms of COVID-19 were discussed with the patient and how to seek care for testing (follow up with PCP or arrange E-visit).    I spent a total of 20  minutes with the patient spent in direct patient consultation.  Additional time spent with chart review  / charting (studies, outside notes, etc): 18 min Total Time: 38 min  Current medicines are reviewed at length with the patient today.  (+/- concerns) None none  This visit occurred during the SARS-CoV-2 public health emergency.  Safety protocols were in place, including screening questions prior to the visit, additional usage of staff PPE, and extensive cleaning of exam room while observing appropriate contact time as indicated for disinfecting solutions.  Notice: This dictation was prepared with Dragon dictation along with smart phrase technology. Any transcriptional errors that result from this process are unintentional and may not be corrected upon review.   Studies Ordered:  No orders of the defined types were placed in this encounter.   Patient Instructions / Medication Changes & Studies & Tests Ordered   Patient  Instructions  Medication Instructions:    DECREASE  AMLODIPINE 2.5 MG  ( 1/2 TABLET OF 5 MG)   DAILY    TAKE AT BEDTIME  METOPROLOL SUCCINATE  25 MG  ONE TABLET  *If you need a refill on your cardiac medications before your next appointment, please call your pharmacy*   Lab Work:  NOT NEEDED   Testing/Procedures:  NOT NEEDED  Follow-Up: At [facility], you and your health needs are our priority.  As part of our continuing mission to provide you with exceptional heart care, we have created designated Provider Care Teams.  These Care Teams include your primary Cardiologist (physician) and Advanced Practice Providers (APPs -  Physician Assistants and Nurse Practitioners) who all work together to provide you with the care you need, when you need it.     Your next appointment:   6 month(s)  The format for your next appointment:   In Person  Provider:   Glenetta Hew, MD      Glenetta Hew, M.D., M.S. Interventional Cardiologist   Pager # [phone] Phone # [phone] [address] [place], [place] [address]   Thank you for choosing Heartcare at [facility]!!

## 2020-12-29 ENCOUNTER — Ambulatory Visit: Payer: Medicare Other | Admitting: Physical Therapy

## 2020-12-29 NOTE — Therapy (Signed)
[facility] [address] [place], [place], [address] Phone: [phone]   Fax:  [phone]  Physical Therapy Treatment  Patient Details  Name: April Church MRN: [id number] Date of Birth: 03-20-1940 Referring Provider (PT): Dr. Courtney Heys   Encounter Date: 12/30/2020   PT End of Session - 12/30/20 1331     Visit Number 25    Number of Visits 27    Date for PT Re-Evaluation 01/16/21    Authorization Type Medicare    Authorization Time Period 12-18-20 - 02-21-21    Progress Note Due on Visit 30    PT Start Time 1015    PT Stop Time 1100    PT Time Calculation (min) 45 min    Equipment Utilized During Treatment Other (comment)   foam noodles   Activity Tolerance Patient tolerated treatment well    Behavior During Therapy [facility] for tasks assessed/performed             Past Medical History:  Diagnosis Date   Acquired solitary kidney 04/2008   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 2011- no further tx. now- [name] , [name]   Cyst of finger 11/2011   annular cyst right long finger   Dental crowns present    Dermatitis    Frequency of urination    GERD (gastroesophageal reflux disease)    Hemorrhoid    History of colon polyps 2009   Also noted 2012 and 2017 by colonoscopy.   History of shingles 1971   Had right ischial recurrence in the March 2019   Hyperlipidemia    On atorvastatin   Hypertension    under control, has been on med. x 2 yrs.   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 Leta Baptist), neurosurgeon Dr. Arnoldo Morale   PONV (postoperative nausea and vomiting)    Seasonal allergies    Shingles of eyelid 1971   Right eyelid; recurrent -> last episode 05/11/2017   Tremors of nervous system    hands-essential tremor;  associated with Parkinson's.   Trigger finger of right hand 11/2011   long finger    Past Surgical History:  Procedure Laterality Date   14-day Zio Patch Monitor  08/2020   (report to be scanned): Predominant SR w/ HR 61 to 142 bpm and average 86 bpm.  Total of 59 episodes of PAT/PSVT  4 to 15 beats (not noted on diary).  Fastest was 5 beats at a rate of 193 bpm, longest was 15 beats at a rate of 106 bpm.  Rare isolated PACs (as well as couplets and triplets) with rare isolated PVCs.  No sustained arrhythmias or bradycardia to explain syncope.   ANTERIOR CERVICAL DECOMPRESSION/DISCECTOMY FUSION 4 LEVELS N/A 11/14/2013   Procedure: ANTERIOR CERVICAL DECOMPRESSION/DISCECTOMY FUSION 4 LEVELS;  Surgeon: Newman Pies, MD;  Location: [facility];  Service: Neurosurgery;  Laterality: N/A;  C34 C45 C56 C67 anterior cervical fusion with interbody prosthesis plating and  bonegraft   APPENDECTOMY  age 23   BREAST LUMPECTOMY  06/16/2009   left; SLN bx.   BREAST LUMPECTOMY  07/01/2009   re-excision   BREAST SURGERY  02/22/1997   reduction   CATARACT EXTRACTION, BILATERAL     COLONOSCOPY WITH PROPOFOL N/A 12/03/2014   Procedure: COLONOSCOPY WITH PROPOFOL;  Surgeon: [name], MD;  Location: [facility];  Service: Endoscopy;  Laterality: N/A;   FOOT SURGERY  06/22/2011   left   KNEE ARTHROSCOPY  03/12/2005   right   KNEE ARTHROSCOPY     left   NASAL SINUS SURGERY     x 2   NEPHRECTOMY LIVING DONOR Left 04/22/2008   donated to spouse 2010([facility])   TRIGGER FINGER RELEASE  12/16/2011   Procedure: RELEASE TRIGGER FINGER/A-1 PULLEY;  Surgeon: Tennis Must, MD;  Location: [facility];  Service: Orthopedics;  Laterality: Right;  RIGHT LONG FINGER TRIGGER RELEASE & ANNULAR CYST EXCISION   TUMOR EXCISION  age 38   right arm    There were no vitals filed for this visit.  Patient seen for aquatic therapy today.  Treatment took place in water 3-4 feet deep depending upon  activity.  Pt entered and exited the pool via stairs with bilat rails.  Warm up with water walking forwards x 2-3 laps down and back in 3.5 ft of water with cues for large step and large arm swing.  Also performed side stepping with UE ABD and ADD x 3 laps to L and R.    Performed standing gastroc stretch with foot against pool wall in DF x 30 seconds, alternating R and L side x 3 reps each side.  Utilized pool noodle under ankle for single side hamstring stretch with active ankle DF x 30 seconds and then transitioned into strengthening with active hip extension back down to neutral x 10 reps; maintained LE in 90 deg flexion while performing active hip ABD and ADD supported on noodle x 10 reps; performed on R and L side.  Required back support against wall for balance.  Faced wall and placed noodle under dorsum of foot and attempted to use for hip flexor and quad stretch; pt unable to maintain upright trunk in order to achieve stretch.  Transitioned to use of Ai Chi balance exercises in 3.5 ft of water to focus on dynamic balance with UE and LE rotation: Flowing x 5 reps to each side and Reflecting x 5 reps to each side without turning; therapist provided visual and verbal cues for sequencing and intermittent assistance to prevent posterior LOB.  Transitioned to water bench and performed sit > stand from elevated bench with UE support on pool noodle focusing on anterior lean to push noodle forwards and shift weight anteriorly to BOS, once standing pt cued to push noodle under the water to thighs and hold; cued to control weight shift backwards as UE reached forwards; performed x 10 reps with therapist providing hands on facilitation for weight shift and to prevent posterior LOB.  Also performed sit <> stand from bench without noodle with eyes closed and keeping balance once in standing and then returning to sit x 10 reps with min A.  Pt requires buoyancy of water for support for joint offloading for pain  reduction in bilat gastroc/lower legs, to reduce fall risk with gait training and balance exercises with minimal UE support; exercises able to be performed safely in water without the risk of fall compared to those same exercises performed on land; viscosity of water needed for resistance for strengthening.  Current of water provides perturbations for challenging static & dynamic standing balance.    PT Short Term Goals - 12/19/20 6144       PT SHORT TERM GOAL #1   Title Pt will be independent with progression of HEP for  decreased pain and improved functional strength, transfers, balance,  and gait.  TARGET 10/31/2020    Baseline 10/31/20: met with current program, will benefit from advancement as pt progresses    Status Achieved      PT SHORT TERM GOAL #2   Title Pt will improve 5x sit<>stand to less than or equal to 18 seconds for improved functional lower extremity strength.    Baseline 10/31/20: 15.88 sec's no UE support from standard height surface    Status Achieved      PT SHORT TERM GOAL #3   Title Pt will improve posterior push and release test to 2 steps or less for improved balance recovery.    Baseline 10/31/20: met in session today    Status Achieved      PT SHORT TERM GOAL #4   Baseline 7/1               PT Long Term Goals - 12/18/20 1206       PT LONG TERM GOAL #1   Title Pt will verbalize plans for continued community fitness/HEP to maximize gains made in PT.  TARGET 01/16/2021    Time 4    Period Weeks    Status On-going    Target Date 01/16/21      PT LONG TERM GOAL #2   Title Pt will subjectively report at least 25% improvement with sit to stand transfers.    Baseline --    Time 4    Period Weeks    Status New    Target Date 01/16/21      PT LONG TERM GOAL #3   Title Pt will improve MiniBESTest to at least 20/28 for decreased fall risk.    Baseline 17/28 10/09/20;    Time 8    Period Weeks    Status Deferred      PT LONG TERM GOAL #4   Title Pt will  imrpove TUG score and TUG cog score to less than or equal to 10% difference for improved dual tasking with gait.    Baseline 16.9, 23.56; TUG 13 sec, TUG cogn 15 seconds 10/09/20;    15.97 TUG ;   21.09 secs cog TUG (approx. 35% difference)    Time 8    Period Weeks    Status Not Met      PT LONG TERM GOAL #5   Title Pt will ambulate at least 20 minutes with appropriate standing rest breaks, without c/o increased pain or fatigue for improved gait indpedence in her neighborhood.    Baseline 10 minutes before having to stop; 10/09/20:  reports 15 minutes of walking in neighborhood, " a little pain", but I can handle it.    Time 8    Period Weeks    Status Not Met                   Plan - 12/30/20 1331     Clinical Impression Statement Continued to utilize aquatic environment to facilitate safe performance of LE stretches, dynamic balance training and sit <> stand training.  Pt continues to experience intermittent LOB posteriorly with various balance challenges.  Will continue to address in order to progress towards LTG and to transition to [facility] aquatic exercise group.    Personal Factors and Comorbidities Comorbidity 3+    Comorbidities asthma, GERD, breast cancer, HTN, cevical spondylosis, lumbar radiculopathy, OA of bilat knees, hx of donating kidney to husband    Examination-Activity Limitations Locomotion Level;Transfers;Stand  Examination-Participation Restrictions Church;Community Activity;Meal Prep    PT Frequency 2x / week    PT Duration 8 weeks   per recert, including 0/99/8338 week   PT Treatment/Interventions ADLs/Self Care Home Management;Aquatic Therapy;DME Instruction;Neuromuscular re-education;Balance training;Therapeutic exercise;Therapeutic activities;Functional mobility training;Gait training;Patient/family education;Manual techniques;Passive range of motion    PT Next Visit Plan Cont aquatic therapy-continue to focus on weight shifting forwards, balance with  rotation.  Check goals on 11/21.  Who is primary PT doing D/C?    PT Home Exercise Plan Access Code: [email or web address]    Consulted and Agree with Plan of Care Patient             Patient will benefit from skilled therapeutic intervention in order to improve the following deficits and impairments:  Abnormal gait, Decreased range of motion, Pain, Decreased balance, Impaired flexibility, Decreased mobility, Decreased strength, Postural dysfunction  Visit Diagnosis: Other abnormalities of gait and mobility  Unsteadiness on feet  Muscle weakness (generalized)  Pain in right lower leg  Abnormal posture  Pain in left lower leg  Other symptoms and signs involving the nervous system     Problem List Patient Active Problem List   Diagnosis Date Noted   PSVT (paroxysmal supraventricular tachycardia) ([place]) 11/26/2020   Syncope and collapse 11/24/2020   Primary osteoarthritis of right knee 05/12/2020   Primary osteoarthritis of left knee 05/12/2020   Hyperlipidemia    Bilateral calf pain 03/05/2019   Genetic testing 02/13/2019   Family history of ovarian cancer    Family history of bladder cancer    Family history of colon cancer    Family history of leukemia    Lumbar radiculopathy 12/08/2018   Myofascial pain 12/08/2018   Impaired gait and mobility 12/08/2018   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   Chronic nonallergic rhinitis 12/29/2016   Mild persistent asthma, uncomplicated 73/41/9379   Vocal fold paralysis, 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 [place], [place], [address] Phone:  [phone]   Fax:  [phone]  Name: JANILAH HOJNACKI MRN: [id number] Date of Birth: [birth year]

## 2020-12-30 ENCOUNTER — Ambulatory Visit: Payer: Medicare Other | Attending: Internal Medicine | Admitting: Physical Therapy

## 2020-12-30 DIAGNOSIS — R29818 Other symptoms and signs involving the nervous system: Secondary | ICD-10-CM | POA: Insufficient documentation

## 2020-12-30 DIAGNOSIS — R2681 Unsteadiness on feet: Secondary | ICD-10-CM | POA: Diagnosis not present

## 2020-12-30 DIAGNOSIS — R471 Dysarthria and anarthria: Secondary | ICD-10-CM | POA: Insufficient documentation

## 2020-12-30 DIAGNOSIS — R2689 Other abnormalities of gait and mobility: Secondary | ICD-10-CM | POA: Insufficient documentation

## 2020-12-30 DIAGNOSIS — M79661 Pain in right lower leg: Secondary | ICD-10-CM | POA: Diagnosis not present

## 2020-12-30 DIAGNOSIS — M6281 Muscle weakness (generalized): Secondary | ICD-10-CM | POA: Insufficient documentation

## 2020-12-30 DIAGNOSIS — M79662 Pain in left lower leg: Secondary | ICD-10-CM | POA: Insufficient documentation

## 2020-12-30 DIAGNOSIS — R293 Abnormal posture: Secondary | ICD-10-CM | POA: Insufficient documentation

## 2020-12-30 DIAGNOSIS — R278 Other lack of coordination: Secondary | ICD-10-CM | POA: Insufficient documentation

## 2021-01-01 ENCOUNTER — Other Ambulatory Visit: Payer: Self-pay

## 2021-01-02 ENCOUNTER — Encounter
Payer: Medicare Other | Attending: Physical Medicine and Rehabilitation | Admitting: Physical Medicine and Rehabilitation

## 2021-01-02 ENCOUNTER — Other Ambulatory Visit: Payer: Self-pay

## 2021-01-02 ENCOUNTER — Encounter: Payer: Self-pay | Admitting: Physical Medicine and Rehabilitation

## 2021-01-02 VITALS — BP 142/92 | HR 84 | Ht 60.0 in | Wt 124.0 lb

## 2021-01-02 DIAGNOSIS — M170 Bilateral primary osteoarthritis of knee: Secondary | ICD-10-CM

## 2021-01-02 DIAGNOSIS — M79662 Pain in left lower leg: Secondary | ICD-10-CM | POA: Insufficient documentation

## 2021-01-02 DIAGNOSIS — M79661 Pain in right lower leg: Secondary | ICD-10-CM | POA: Diagnosis not present

## 2021-01-02 DIAGNOSIS — M1711 Unilateral primary osteoarthritis, right knee: Secondary | ICD-10-CM | POA: Insufficient documentation

## 2021-01-02 DIAGNOSIS — M1712 Unilateral primary osteoarthritis, left knee: Secondary | ICD-10-CM | POA: Insufficient documentation

## 2021-01-02 DIAGNOSIS — R2689 Other abnormalities of gait and mobility: Secondary | ICD-10-CM | POA: Insufficient documentation

## 2021-01-02 MED ORDER — TRAMADOL HCL 50 MG PO TABS: 50.0000 mg | ORAL_TABLET | Freq: Two times a day (BID) | ORAL | 1 refills | Status: DC | PRN

## 2021-01-02 MED ORDER — GABAPENTIN 300 MG PO CAPS: 300.0000 mg | ORAL_CAPSULE | Freq: Every day | ORAL | 5 refills | Status: DC

## 2021-01-02 NOTE — Patient Instructions (Signed)
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 09/18/19 Back injection- R L4-5 transforminal.  epidural steroid injection.  Here for f/u for chronic pain and B/L knee injections.    Suggest taking 1/2 tramadol in morning and 1 tab nightly. Refilled tramadol 50 mg BID prn- #60- with 1 refill Call when needs more refills.   2. Will try gabapentin 300 mg at dinner time/nightly- for nerve pain- to help the night/evening pain. Can cause sleepiness- usually pretty well tolerated if just at night.  Rarely can cause dizziness/off balance or leg swelling-  If you take the first pill and have symptoms, then don't take again- will know with the first pill.   3.  steroid injection was performed at knees B/L using 1% plain Lidocaine and 40mg  /1cc of Kenalog. This was well tolerated.  Cleaned with betadine x3 and allowed to dry- then alcohol then injected using 27 gauge 1.5 inch needle- no bleeding or complications.    F/U in 3 months for steroid injections of B/L knees Lidocaine will kick in 15 minutes- and wear off tonight- the steroid will kick in tomorrow within 24 hours and take up to 72 hours to fully kick in.  4. F/U in 3 months- can CALL me to let me know how the new medicine works-

## 2021-01-02 NOTE — Progress Notes (Signed)
Subjective:    Patient ID: April Church, female    DOB: August 21, 1940, 80 y.o.   MRN: [id number]  HPI  Patient is a 80 yr old female with 1 kidney (has R- donated L to husband 10 yrs ago), Parkinson's disease, HTN, hx of Breast CA 9 yrs ago- R breast- s/p lumpectomy; HLD; mild asthma here for  f/ufor lumbar radiculopathy. 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 09/18/19 Back injection- R L4-5 transforminal.  epidural steroid injection.  Here for f/u for chronic pain and B/L knee injections.   Got last knee injections on 10/03/20- so   Hurts on inner R>L knees and down legs.  Esp at night when lays down.   Neurology thinks legs pain is from radiculopathy- from "pinched nerve in back".   Takes 1 tramadol at night- during day makes her sleepy.  Never takes 2x/day.  Kidney function is great.   Got b blocker for tachycardia from [place]- it makes her more sleepy- "sleeping great at night".          Pain Inventory Average Pain 9 Pain Right Now 4 My pain is constant, burning, and aching  In the last 24 hours, has pain interfered with the following? General activity 6 Relation with others 0 Enjoyment of life 6 What TIME of day is your pain at its worst? morning  and night Sleep (in general) Good  Pain is worse with: sitting and inactivity Pain improves with: heat/ice, therapy/exercise, and medication Relief from Meds: 2  Family History  Problem Relation Age of Onset   Kidney disease Mother    Stroke Father    Stroke Sister        08/2015   Diabetes Brother    Heart disease Brother    Ovarian cancer Sister 43   Heart disease Paternal Grandmother    Heart disease Paternal Grandfather    Leukemia Other 26       brother's grandson   Bladder Cancer Brother 77   Colon cancer Niece        dx in late 11s   Social History   Socioeconomic History   Marital status: Widowed    Spouse name: [name]-David   Number of children: 1    Years of education: HS   Highest education level: Not on file  Occupational History   Occupation: Retired   Occupation: Housewife  Tobacco Use   Smoking status: Never   Smokeless tobacco: Never  Vaping Use   Vaping Use: Never used  Substance and Sexual Activity   Alcohol use: No    Alcohol/week: 0.0 standard drinks   Drug use: No   Sexual activity: Not Currently  Other Topics Concern   Not on file  Social History Narrative   10/13/20    Recently widowed-lives alone, husband [name] passed 04/26/2020    Patient has 1 daughter who lives in [place], [place]-> 1 grandson who lives in [place]   Patient has a HS education.    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    Patient is right handed.    Patient drinks caffeine occasionally.     Social Determinants of Health   Financial Resource Strain: Not on file  Food Insecurity: Not on file  Transportation Needs: Not on file  Physical Activity: Not on file  Stress: Not on file  Social Connections: Not on file   Past Surgical History:  Procedure Laterality Date  14-day Zio Patch Monitor  08/2020   (report to be scanned): Predominant SR w/ HR 61 to 142 bpm and average 86 bpm.  Total of 59 episodes of PAT/PSVT  4 to 15 beats (not noted on diary).  Fastest was 5 beats at a rate of 193 bpm, longest was 15 beats at a rate of 106 bpm.  Rare isolated PACs (as well as couplets and triplets) with rare isolated PVCs.  No sustained arrhythmias or bradycardia to explain syncope.   ANTERIOR CERVICAL DECOMPRESSION/DISCECTOMY FUSION 4 LEVELS N/A 11/14/2013   Procedure: ANTERIOR CERVICAL DECOMPRESSION/DISCECTOMY FUSION 4 LEVELS;  Surgeon: Newman Pies, MD;  Location: [facility];  Service: Neurosurgery;  Laterality: N/A;  C34 C45 C56 C67 anterior cervical fusion with interbody prosthesis plating and  bonegraft   APPENDECTOMY  age 15   BREAST LUMPECTOMY  06/16/2009   left; SLN bx.   BREAST LUMPECTOMY  07/01/2009    re-excision   BREAST SURGERY  02/22/1997   reduction   CATARACT EXTRACTION, BILATERAL     COLONOSCOPY WITH PROPOFOL N/A 12/03/2014   Procedure: COLONOSCOPY WITH PROPOFOL;  Surgeon: [name], MD;  Location: [facility];  Service: Endoscopy;  Laterality: N/A;   FOOT SURGERY  06/22/2011   left   KNEE ARTHROSCOPY  03/12/2005   right   KNEE ARTHROSCOPY     left   NASAL SINUS SURGERY     x 2   NEPHRECTOMY LIVING DONOR Left 04/22/2008   donated to spouse 2010([facility])   TRIGGER FINGER RELEASE  12/16/2011   Procedure: RELEASE TRIGGER FINGER/A-1 PULLEY;  Surgeon: Tennis Must, MD;  Location: [facility];  Service: Orthopedics;  Laterality: Right;  RIGHT LONG FINGER TRIGGER RELEASE & ANNULAR CYST EXCISION   TUMOR EXCISION  age 80   right arm   Past Surgical History:  Procedure Laterality Date   14-day Zio Patch Monitor  08/2020   (report to be scanned): Predominant SR w/ HR 61 to 142 bpm and average 86 bpm.  Total of 59 episodes of PAT/PSVT  4 to 15 beats (not noted on diary).  Fastest was 5 beats at a rate of 193 bpm, longest was 15 beats at a rate of 106 bpm.  Rare isolated PACs (as well as couplets and triplets) with rare isolated PVCs.  No sustained arrhythmias or bradycardia to explain syncope.   ANTERIOR CERVICAL DECOMPRESSION/DISCECTOMY FUSION 4 LEVELS N/A 11/14/2013   Procedure: ANTERIOR CERVICAL DECOMPRESSION/DISCECTOMY FUSION 4 LEVELS;  Surgeon: Newman Pies, MD;  Location: [facility];  Service: Neurosurgery;  Laterality: N/A;  C34 C45 C56 C67 anterior cervical fusion with interbody prosthesis plating and  bonegraft   APPENDECTOMY  age 5   BREAST LUMPECTOMY  06/16/2009   left; SLN bx.   BREAST LUMPECTOMY  07/01/2009   re-excision   BREAST SURGERY  02/22/1997   reduction   CATARACT EXTRACTION, BILATERAL     COLONOSCOPY WITH PROPOFOL N/A 12/03/2014   Procedure: COLONOSCOPY WITH PROPOFOL;  Surgeon: [name], MD;  Location: [facility];  Service:  Endoscopy;  Laterality: N/A;   FOOT SURGERY  06/22/2011   left   KNEE ARTHROSCOPY  03/12/2005   right   KNEE ARTHROSCOPY     left   NASAL SINUS SURGERY     x 2   NEPHRECTOMY LIVING DONOR Left 04/22/2008   donated to spouse 2010([facility])   TRIGGER FINGER RELEASE  12/16/2011   Procedure: RELEASE TRIGGER FINGER/A-1 PULLEY;  Surgeon: Tennis Must, MD;  Location:  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 2011- no further tx. now- [name] , [name]   Cyst of finger 11/2011   annular cyst right long finger   Dental crowns present    Dermatitis    Frequency of urination    GERD (gastroesophageal reflux disease)    Hemorrhoid    History of colon polyps 2009   Also noted 2012 and 2017 by colonoscopy.   History of shingles 1971   Had right ischial recurrence in the March 2019   Hyperlipidemia    On atorvastatin   Hypertension    under control, has been on med. x 2 yrs.   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 Leta Baptist), neurosurgeon Dr. Arnoldo Morale   PONV (postoperative nausea and vomiting)    Seasonal allergies    Shingles of eyelid 1971   Right eyelid; recurrent -> last episode 05/11/2017   Tremors of nervous system    hands-essential tremor; associated with Parkinson's.   Trigger finger of right hand 11/2011   long finger   BP (!) 142/92   Pulse 84   Ht 5' (1.524 m)   Wt 124 lb (56.2 kg)   SpO2 98%   BMI 24.22 kg/m   Opioid Risk Score:   Fall Risk Score:  `1  Depression screen PHQ 2/9  Depression screen [facility] 2/9 01/02/2021 05/12/2020  Decreased  Interest 0 1  Down, Depressed, Hopeless 0 1  PHQ - 2 Score 0 2  Some recent data might be hidden    Review of Systems  Musculoskeletal:  Positive for gait problem.       Pain in both knees Pain in both lower legs   All other systems reviewed and are negative.     Objective:   Physical Exam Awake, alert, appropriate, dysphonia of voice;        Assessment & Plan:    Patient is a 80 yr old female with 1 kidney (has R- donated L to husband 10 yrs ago), Parkinson's disease, HTN, hx of Breast CA 9 yrs ago- R breast- s/p lumpectomy; HLD; mild asthma here for  f/ufor lumbar radiculopathy. 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 09/18/19 Back injection- R L4-5 transforminal.  epidural steroid injection.  Here for f/u for chronic pain and B/L knee injections.    Suggest taking 1/2 tramadol in morning and 1 tab nightly. Refilled tramadol 50 mg BID prn- #60- with 1 refill Call when needs more refills.   2. Will try gabapentin 300 mg at dinner time/nightly- for nerve pain- to help the night/evening pain. Can cause sleepiness- usually pretty well tolerated if just at night.  Rarely can cause dizziness/off balance or leg swelling-  If you take the first pill and have symptoms, then don't take again- will know with the first pill.   3.  steroid injection was performed at knees B/L using 1% plain Lidocaine and 40mg  /1cc of Kenalog. This was well tolerated.  Cleaned with betadine x3 and allowed to  dry- then alcohol then injected using 27 gauge 1.5 inch needle- no bleeding or complications.    F/U in 3 months for steroid injections of B/L knees Lidocaine will kick in 15 minutes- and wear off tonight- the steroid will kick in tomorrow within 24 hours and take up to 72 hours to fully kick in.  4. F/U in 3 months- can CALL me to let me know how the new medicine works-

## 2021-01-04 ENCOUNTER — Encounter: Payer: Self-pay | Admitting: Cardiology

## 2021-01-04 NOTE — Assessment & Plan Note (Signed)
Most recent LDL is 112 on current dose of atorvastatin. With advanced age and musculoskeletal issues and borderline memory issues, would avoid titrating further for now, but may consider switching to rosuvastatin 20 mg to avoid increasing dose.

## 2021-01-04 NOTE — Assessment & Plan Note (Addendum)
Now has had a second episode where she fell on it did not seem at all associated with an arrhythmia.  Nothing noted on monitor, with relatively normal echocardiogram and venous duplex..  At this point, must assume that is probably orthostatic related.  I doubt that this is related to SVT because her heart rate was fast enough longer not lasting.  Unlikely that she had a bradycardic episode.  Plan: Reduce amlodipine to 2.5 mg and add low-dose Toprol 25 mg daily.  She will start 12.5 mg daily and then increase to full 25 mg after a week or 2.

## 2021-01-04 NOTE — Assessment & Plan Note (Signed)
Mild cramping.  No significant venous reflux just minimal 1 for unilateral reflux. Excellent with her having some neurocardiogenic blood pressure changes and orthostatic symptoms, would not be unreasonable to wear support/compression stockings.

## 2021-01-04 NOTE — Assessment & Plan Note (Signed)
Short-lived episodes noted noted on monitor, but somewhat frequent. With the concern for possible hypotension, I would reduce her amlodipine dose to 2.5 mg which will allow me to add low-dose Toprol to take at night.  Also discussed avoiding triggers and vagal maneuvers.

## 2021-01-05 ENCOUNTER — Ambulatory Visit: Payer: Medicare Other | Admitting: Physical Therapy

## 2021-01-05 ENCOUNTER — Other Ambulatory Visit: Payer: Self-pay

## 2021-01-05 DIAGNOSIS — R2681 Unsteadiness on feet: Secondary | ICD-10-CM

## 2021-01-05 DIAGNOSIS — F3341 Major depressive disorder, recurrent, in partial remission: Secondary | ICD-10-CM | POA: Diagnosis not present

## 2021-01-05 DIAGNOSIS — M79662 Pain in left lower leg: Secondary | ICD-10-CM | POA: Diagnosis not present

## 2021-01-05 DIAGNOSIS — G20 Parkinson's disease: Secondary | ICD-10-CM | POA: Diagnosis not present

## 2021-01-05 DIAGNOSIS — M6281 Muscle weakness (generalized): Secondary | ICD-10-CM

## 2021-01-05 DIAGNOSIS — R2689 Other abnormalities of gait and mobility: Secondary | ICD-10-CM | POA: Diagnosis not present

## 2021-01-05 DIAGNOSIS — M4726 Other spondylosis with radiculopathy, lumbar region: Secondary | ICD-10-CM | POA: Diagnosis not present

## 2021-01-05 DIAGNOSIS — M79661 Pain in right lower leg: Secondary | ICD-10-CM | POA: Diagnosis not present

## 2021-01-05 DIAGNOSIS — R293 Abnormal posture: Secondary | ICD-10-CM | POA: Diagnosis not present

## 2021-01-05 DIAGNOSIS — J45909 Unspecified asthma, uncomplicated: Secondary | ICD-10-CM | POA: Diagnosis not present

## 2021-01-05 DIAGNOSIS — G4700 Insomnia, unspecified: Secondary | ICD-10-CM | POA: Diagnosis not present

## 2021-01-05 DIAGNOSIS — I10 Essential (primary) hypertension: Secondary | ICD-10-CM | POA: Diagnosis not present

## 2021-01-05 DIAGNOSIS — E785 Hyperlipidemia, unspecified: Secondary | ICD-10-CM | POA: Diagnosis not present

## 2021-01-06 ENCOUNTER — Encounter: Payer: Self-pay | Admitting: Physical Therapy

## 2021-01-06 NOTE — Therapy (Signed)
[facility] [address] [place], [place], [address] Phone: [phone]   Fax:  [phone]  Physical Therapy Treatment  Patient Details  Name: April Church MRN: [id number] Date of Birth: [birth year] Referring Provider (PT): Dr. Courtney Heys   Encounter Date: 01/05/2021   PT End of Session - 01/06/21 1410     Visit Number 26    Number of Visits 27    Date for PT Re-Evaluation 01/16/21    Authorization Type Medicare    Authorization Time Period 12-18-20 - 02-21-21    Progress Note Due on Visit 30    PT Start Time 1320    PT Stop Time 1400    PT Time Calculation (min) 40 min    Equipment Utilized During Treatment Other (comment)   foam noodles, single bar bells   Activity Tolerance Patient tolerated treatment well    Behavior During Therapy [facility] for tasks assessed/performed             Past Medical History:  Diagnosis Date   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 2011- no further tx. now- [name] , [name]   Cyst of finger 11/2011   annular cyst right long finger   Dental crowns present    Dermatitis    Frequency of urination    GERD (gastroesophageal reflux disease)    Hemorrhoid    History of colon polyps 2009   Also noted 2012 and 2017 by colonoscopy.   History of shingles 1971   Had right ischial recurrence in the March 2019   Hyperlipidemia    On atorvastatin   Hypertension    under control, has been on med. x 2 yrs.   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 Leta Baptist), neurosurgeon Dr. Arnoldo Morale   PONV (postoperative nausea and vomiting)    Seasonal allergies    Shingles of eyelid 1971   Right eyelid; recurrent -> last episode 05/11/2017   Tremors of nervous system     hands-essential tremor; associated with Parkinson's.   Trigger finger of right hand 11/2011   long finger    Past Surgical History:  Procedure Laterality Date   14-day Zio Patch Monitor  08/2020   (report to be scanned): Predominant SR w/ HR 61 to 142 bpm and average 86 bpm.  Total of 59 episodes of PAT/PSVT  4 to 15 beats (not noted on diary).  Fastest was 5 beats at a rate of 193 bpm, longest was 15 beats at a rate of 106 bpm.  Rare isolated PACs (as well as couplets and triplets) with rare isolated PVCs.  No sustained arrhythmias or bradycardia to explain syncope.   ANTERIOR CERVICAL DECOMPRESSION/DISCECTOMY FUSION 4 LEVELS N/A 11/14/2013   Procedure: ANTERIOR CERVICAL DECOMPRESSION/DISCECTOMY FUSION 4 LEVELS;  Surgeon: Newman Pies, MD;  Location: [facility];  Service: Neurosurgery;  Laterality: N/A;  C34 C45 C56 C67 anterior cervical fusion with interbody prosthesis plating and  bonegraft   APPENDECTOMY  age 53   BREAST LUMPECTOMY  06/16/2009   left; SLN bx.   BREAST LUMPECTOMY  07/01/2009   re-excision   BREAST SURGERY  02/22/1997   reduction   CATARACT EXTRACTION, BILATERAL     COLONOSCOPY WITH PROPOFOL N/A 12/03/2014   Procedure: COLONOSCOPY  WITH PROPOFOL;  Surgeon: Charolett Bumpers, MD;  Location: [facility];  Service: Endoscopy;  Laterality: N/A;   FOOT SURGERY  06/22/2011   left   KNEE ARTHROSCOPY  03/12/2005   right   KNEE ARTHROSCOPY     left   NASAL SINUS SURGERY     x 2   NEPHRECTOMY LIVING DONOR Left 04/22/2008   donated to spouse 2010([facility])   TRIGGER FINGER RELEASE  12/16/2011   Procedure: RELEASE TRIGGER FINGER/A-1 PULLEY;  Surgeon: Tami Ribas, MD;  Location: [facility];  Service: Orthopedics;  Laterality: Right;  RIGHT LONG FINGER TRIGGER RELEASE & ANNULAR CYST EXCISION   TUMOR EXCISION  age 25   right arm    There were no vitals filed for this visit.   Subjective Assessment - 01/06/21 1410     Subjective No new compliants. No  falls or pain to report.    Limitations Walking;Standing    How long can you stand comfortably? < 5 minutes    How long can you walk comfortably? 10 minutes or less    Currently in Pain? No/denies    Pain Score 0-No pain             Aquatic therapy at [facility] - pool temp 96 degrees   Patient seen for aquatic therapy today.  Treatment took place in water 3.5-4.5 feet deep depending upon activity.  Pt entered and  exited the pool via step negotiation with use of bil. hands rails.  Fwd, bwd, side walking across/back for 2 laps each Runner stretch at side of pool- 3 for 30 seconds. With blue noodle- fwd walking with trun rotation, then marching bringing noodleto kee for 2 laps aross/back With single bar bells- alt fwd lunge with punches, alt side stepping with horiz abd/adda t water level; walking with punches ,then side stepping with shoulder add/abd under water lefl fo 2 laps With yellow noodle Tandem fwd for 2 laps across/back Cross over fwd, then bwd then [place]      Pt requires buoyancy of water for support for reduced fall risk with gait training and balance exercises with minimal UE support; exercises able to be performed safely in water without the risk of fall compared to those same exercises performed on land;  viscosity of water needed for resistance for strengthening.  Current of water provides perturbations for challenging static & dynamic standing balance.                 PT Short Term Goals - 12/19/20 5398       PT SHORT TERM GOAL #1   Title Pt will be independent with progression of HEP for decreased pain and improved functional strength, transfers, balance,  and gait.  TARGET 10/31/2020    Baseline 10/31/20: met with current program, will benefit from advancement as pt progresses    Status Achieved      PT SHORT TERM GOAL #2   Title Pt will improve 5x sit<>stand to less than or equal to 18 seconds for improved functional lower extremity strength.     Baseline 10/31/20: 15.88 sec's no UE support from standard height surface    Status Achieved      PT SHORT TERM GOAL #3   Title Pt will improve posterior push and release test to 2 steps or less for improved balance recovery.    Baseline 10/31/20: met in session today    Status Achieved      PT SHORT TERM GOAL #4  Baseline 7/1               PT Long Term Goals - 12/18/20 1206       PT LONG TERM GOAL #1   Title Pt will verbalize plans for continued community fitness/HEP to maximize gains made in PT.  TARGET 01/16/2021    Time 4    Period Weeks    Status On-going    Target Date 01/16/21      PT LONG TERM GOAL #2   Title Pt will subjectively report at least 25% improvement with sit to stand transfers.    Baseline --    Time 4    Period Weeks    Status New    Target Date 01/16/21      PT LONG TERM GOAL #3   Title Pt will improve MiniBESTest to at least 20/28 for decreased fall risk.    Baseline 17/28 10/09/20;    Time 8    Period Weeks    Status Deferred      PT LONG TERM GOAL #4   Title Pt will imrpove TUG score and TUG cog score to less than or equal to 10% difference for improved dual tasking with gait.    Baseline 16.9, 23.56; TUG 13 sec, TUG cogn 15 seconds 10/09/20;    15.97 TUG ;   21.09 secs cog TUG (approx. 35% difference)    Time 8    Period Weeks    Status Not Met      PT LONG TERM GOAL #5   Title Pt will ambulate at least 20 minutes with appropriate standing rest breaks, without c/o increased pain or fatigue for improved gait indpedence in her neighborhood.    Baseline 10 minutes before having to stop; 10/09/20:  reports 15 minutes of walking in neighborhood, " a little pain", but I can handle it.    Time 8    Period Weeks    Status Not Met                   Plan - 01/06/21 1412     Clinical Impression Statement Today's skilled session continued to focus on stretching, strengthening and balance in the aquatic setting. No issues noted or reported  in session. The pt is making progress toward goals and should benefit from continued PT to progress toward unmet goals.    Personal Factors and Comorbidities Comorbidity 3+    Comorbidities asthma, GERD, breast cancer, HTN, cevical spondylosis, lumbar radiculopathy, OA of bilat knees, hx of donating kidney to husband    Examination-Activity Limitations Locomotion Level;Transfers;Stand    Examination-Participation Restrictions Church;Community Activity;Meal Prep    PT Frequency 2x / week    PT Duration 8 weeks   per recert, including 7/58/8325 week   PT Treatment/Interventions ADLs/Self Care Home Management;Aquatic Therapy;DME Instruction;Neuromuscular re-education;Balance training;Therapeutic exercise;Therapeutic activities;Functional mobility training;Gait training;Patient/family education;Manual techniques;Passive range of motion    PT Next Visit Plan Cont aquatic therapy-continue to focus on weight shifting forwards, balance with rotation.  Check goals on 11/21.  Who is primary PT doing D/C?    PT Home Exercise Plan Access Code: [email or web address]    Consulted and Agree with Plan of Care Patient             Patient will benefit from skilled therapeutic intervention in order to improve the following deficits and impairments:  Abnormal gait, Decreased range of motion, Pain, Decreased balance, Impaired flexibility, Decreased mobility, Decreased strength, Postural dysfunction  Visit Diagnosis: Other abnormalities  of gait and mobility  Unsteadiness on feet  Muscle weakness (generalized)     Problem List Patient Active Problem List   Diagnosis Date Noted   PSVT (paroxysmal supraventricular tachycardia) ([place]) 11/26/2020   Syncope and collapse 11/24/2020   Primary osteoarthritis of right knee 05/12/2020   Primary osteoarthritis of left knee 05/12/2020   Hyperlipidemia    Bilateral calf pain 03/05/2019   Genetic testing 02/13/2019   Family history of ovarian cancer    Family history of  bladder cancer    Family history of colon cancer    Family history of leukemia    Lumbar radiculopathy 12/08/2018   Myofascial pain 12/08/2018   Impaired gait and mobility 12/08/2018   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   Chronic nonallergic rhinitis 12/29/2016   Mild persistent asthma, uncomplicated 52/58/9483   Vocal fold paralysis, 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

## 2021-01-12 ENCOUNTER — Ambulatory Visit: Payer: Medicare Other | Admitting: Physical Therapy

## 2021-01-12 DIAGNOSIS — M79661 Pain in right lower leg: Secondary | ICD-10-CM | POA: Diagnosis not present

## 2021-01-12 DIAGNOSIS — M6281 Muscle weakness (generalized): Secondary | ICD-10-CM | POA: Diagnosis not present

## 2021-01-12 DIAGNOSIS — R2681 Unsteadiness on feet: Secondary | ICD-10-CM | POA: Diagnosis not present

## 2021-01-12 DIAGNOSIS — R293 Abnormal posture: Secondary | ICD-10-CM | POA: Diagnosis not present

## 2021-01-12 DIAGNOSIS — R2689 Other abnormalities of gait and mobility: Secondary | ICD-10-CM

## 2021-01-12 DIAGNOSIS — M79662 Pain in left lower leg: Secondary | ICD-10-CM | POA: Diagnosis not present

## 2021-01-13 ENCOUNTER — Ambulatory Visit: Payer: Self-pay | Admitting: Physical Therapy

## 2021-01-13 ENCOUNTER — Ambulatory Visit: Payer: Medicare Other

## 2021-01-13 ENCOUNTER — Ambulatory Visit: Payer: Medicare Other | Admitting: Occupational Therapy

## 2021-01-13 ENCOUNTER — Encounter: Payer: Self-pay | Admitting: Physical Therapy

## 2021-01-13 ENCOUNTER — Ambulatory Visit: Payer: Medicare Other | Admitting: Physical Therapy

## 2021-01-13 ENCOUNTER — Other Ambulatory Visit: Payer: Self-pay

## 2021-01-13 DIAGNOSIS — R471 Dysarthria and anarthria: Secondary | ICD-10-CM

## 2021-01-13 DIAGNOSIS — R278 Other lack of coordination: Secondary | ICD-10-CM

## 2021-01-13 NOTE — Therapy (Signed)
[facility] [address] [place], [place], [address] Phone: [phone]   Fax:  [phone]  Patient Details  Name: April Church MRN: [id number] Date of Birth: [birth year] Referring Provider:  Leeroy Cha,*  Encounter Date: 01/13/2021 Occupational Therapy Parkinson's Disease Screen  Hand dominance:  right   Physical Performance Test item #2 (simulated eating):  17.43 sec  Physical Performance Test item #4 (donning/doffing jacket):  10.56 sec    9-hole peg test:    RUE  37.03 sec        LUE  30.19 sec  Box & Blocks Test:   RUE  37 blocks        LUE  37 blocks    Change in ability to perform ADLs/IADLs:  no,  handwriting is legible with some difficulty.    Pt does not require occupational therapy services at this time.  Recommended occupational therapy screen in   6 mons    Mohamadou Maciver 01/13/2021, 1:25 PM Theone Murdoch, OTR/L [phone] Phone: [phone] 3:57 PM 01/13/21  [facility] [address] [place], [place], [address] Phone: [phone]   Fax:  [phone]

## 2021-01-13 NOTE — Therapy (Signed)
[facility] [address] [place], [place], [address] Phone: [phone]   Fax:  [phone]  Physical Therapy Treatment  Patient Details  Name: April Church MRN: [id number] Date of Birth: August 02, 1940 Referring Provider (PT): Dr. Courtney Heys   Encounter Date: 01/12/2021   PT End of Session - 01/12/21 1800     Visit Number 27    Number of Visits 27    Date for PT Re-Evaluation 01/16/21    Authorization Type Medicare    Authorization Time Period 12-18-20 - 02-21-21    Progress Note Due on Visit 30    PT Start Time 1229    PT Stop Time 1316    PT Time Calculation (min) 47 min    Equipment Utilized During Treatment Other (comment)   foam noodles, single bar bells, aquatic step   Activity Tolerance Patient tolerated treatment well    Behavior During Therapy [facility] for tasks assessed/performed             Past Medical History:  Diagnosis Date   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 2011- no further tx. now- [name] , [name]   Cyst of finger 11/2011   annular cyst right long finger   Dental crowns present    Dermatitis    Frequency of urination    GERD (gastroesophageal reflux disease)    Hemorrhoid    History of colon polyps 2009   Also noted 2012 and 2017 by colonoscopy.   History of shingles 1971   Had right ischial recurrence in the March 2019   Hyperlipidemia    On atorvastatin   Hypertension    under control, has been on med. x 2 yrs.   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 Leta Baptist), neurosurgeon Dr. Arnoldo Morale   PONV (postoperative nausea and vomiting)    Seasonal allergies    Shingles of eyelid 1971   Right eyelid; recurrent -> last episode 05/11/2017   Tremors of nervous  system    hands-essential tremor; associated with Parkinson's.   Trigger finger of right hand 11/2011   long finger    Past Surgical History:  Procedure Laterality Date   14-day Zio Patch Monitor  08/2020   (report to be scanned): Predominant SR w/ HR 61 to 142 bpm and average 86 bpm.  Total of 59 episodes of PAT/PSVT  4 to 15 beats (not noted on diary).  Fastest was 5 beats at a rate of 193 bpm, longest was 15 beats at a rate of 106 bpm.  Rare isolated PACs (as well as couplets and triplets) with rare isolated PVCs.  No sustained arrhythmias or bradycardia to explain syncope.   ANTERIOR CERVICAL DECOMPRESSION/DISCECTOMY FUSION 4 LEVELS N/A 11/14/2013   Procedure: ANTERIOR CERVICAL DECOMPRESSION/DISCECTOMY FUSION 4 LEVELS;  Surgeon: Newman Pies, MD;  Location: [facility];  Service: Neurosurgery;  Laterality: N/A;  C34 C45 C56 C67 anterior cervical fusion with interbody prosthesis plating and  bonegraft   APPENDECTOMY  age 71   BREAST LUMPECTOMY  06/16/2009   left; SLN bx.   BREAST LUMPECTOMY  07/01/2009   re-excision   BREAST SURGERY  02/22/1997   reduction   CATARACT EXTRACTION, BILATERAL     COLONOSCOPY WITH PROPOFOL N/A 12/03/2014  Procedure: COLONOSCOPY WITH PROPOFOL;  Surgeon: [name], MD;  Location: [facility];  Service: Endoscopy;  Laterality: N/A;   FOOT SURGERY  06/22/2011   left   KNEE ARTHROSCOPY  03/12/2005   right   KNEE ARTHROSCOPY     left   NASAL SINUS SURGERY     x 2   NEPHRECTOMY LIVING DONOR Left 04/22/2008   donated to spouse 2010([facility])   TRIGGER FINGER RELEASE  12/16/2011   Procedure: RELEASE TRIGGER FINGER/A-1 PULLEY;  Surgeon: Tennis Must, MD;  Location: [facility];  Service: Orthopedics;  Laterality: Right;  RIGHT LONG FINGER TRIGGER RELEASE & ANNULAR CYST EXCISION   TUMOR EXCISION  age 1   right arm    There were no vitals filed for this visit.   Subjective Assessment - 01/12/21 1800     Subjective No new  compliants. No falls or pain to report.    Limitations Walking;Standing    How long can you stand comfortably? < 5 minutes    How long can you walk comfortably? 10 minutes or less    Patient Stated Goals Pt's goals for therapy are to get rid of pain, and improve strength.    Currently in Pain? No/denies    Pain Score 0-No pain            Aquatic therapy at [facility] - pool temp 94 degrees   Patient seen for aquatic therapy today.  Treatment took place in water 3.5-4.5 feet deep depending upon activity.  Pt entered and exited pool with bil rails, reciprocal stepping pattern.   Min guard assist with no UE support for gait from steps to bench in water.  At bench In long sitting: passive stretching of bil LE's for heel cords and hamstrings for 30 sec holds x 2-3 reps each. Seated at edge of bench with feet on aquatic step: Sit to stand in parallel stance, then staggered stance  (each foot forward) for 10 reps each, no UE support with occasioanal touch to wall, min guard to min assist  Gait with yellow noodle support- 2-3 laps across and back 4.0 foot water, min guard to min assist for balance Forward walking with emphasis on heel>toe step progression Backward walking with emphasis on posture and step length Side stepping with emphasis on step length, posture  Balance: Dynamic with single bar bells in ~4.0 foot water depth  Forward walking with alternating punching at water surface with cues on sequencing Side stepping left<>right with simultaneous shoulder abd/add under water surface with cues on sequencing.   Static balance with hip width stance: Working on several Ai Chi poses to address balance and posture with min guard to min assist needed, cues on form and technique.   Pt requires buoyancy of water for support for reduced fall risk with gait training and balance exercises with minimal UE support; exercises able to be performed safely in water without the risk of fall compared to  those same exercises performed on land;  viscosity of water needed for resistance for strengthening.  Current of water provides perturbations for challenging static & dynamic standing balance.            PT Short Term Goals - 12/19/20 7001       PT SHORT TERM GOAL #1   Title Pt will be independent with progression of HEP for decreased pain and improved functional strength, transfers, balance,  and gait.  TARGET 10/31/2020    Baseline 10/31/20: met with current program,  will benefit from advancement as pt progresses    Status Achieved      PT SHORT TERM GOAL #2   Title Pt will improve 5x sit<>stand to less than or equal to 18 seconds for improved functional lower extremity strength.    Baseline 10/31/20: 15.88 sec's no UE support from standard height surface    Status Achieved      PT SHORT TERM GOAL #3   Title Pt will improve posterior push and release test to 2 steps or less for improved balance recovery.    Baseline 10/31/20: met in session today    Status Achieved      PT SHORT TERM GOAL #4   Baseline 7/1               PT Long Term Goals - 12/18/20 1206       PT LONG TERM GOAL #1   Title Pt will verbalize plans for continued community fitness/HEP to maximize gains made in PT.  TARGET 01/16/2021    Time 4    Period Weeks    Status On-going    Target Date 01/16/21      PT LONG TERM GOAL #2   Title Pt will subjectively report at least 25% improvement with sit to stand transfers.    Baseline --    Time 4    Period Weeks    Status New    Target Date 01/16/21      PT LONG TERM GOAL #3   Title Pt will improve MiniBESTest to at least 20/28 for decreased fall risk.    Baseline 17/28 10/09/20;    Time 8    Period Weeks    Status Deferred      PT LONG TERM GOAL #4   Title Pt will imrpove TUG score and TUG cog score to less than or equal to 10% difference for improved dual tasking with gait.    Baseline 16.9, 23.56; TUG 13 sec, TUG cogn 15 seconds 10/09/20;    15.97 TUG ;    21.09 secs cog TUG (approx. 35% difference)    Time 8    Period Weeks    Status Not Met      PT LONG TERM GOAL #5   Title Pt will ambulate at least 20 minutes with appropriate standing rest breaks, without c/o increased pain or fatigue for improved gait indpedence in her neighborhood.    Baseline 10 minutes before having to stop; 10/09/20:  reports 15 minutes of walking in neighborhood, " a little pain", but I can handle it.    Time 8    Period Weeks    Status Not Met                   Plan - 01/12/21 1800     Clinical Impression Statement Today's skilled session was pt's final aquatic session. Goals checked prior to pt getting in the pool with community fitness goal paritally met and sit to stand goal not met. Once in the water today's skilled session continued to focus on stretching, strengthening, gait and balance with no issues noted or reported in session. Pt in agreement for discharge.    Personal Factors and Comorbidities Comorbidity 3+    Comorbidities asthma, GERD, breast cancer, HTN, cevical spondylosis, lumbar radiculopathy, OA of bilat knees, hx of donating kidney to husband    Examination-Activity Limitations Locomotion Level;Transfers;Stand    Examination-Participation Restrictions Church;Community Activity;Meal Prep    PT Frequency 2x / week  PT Duration 8 weeks   per recert, including 06/23/5613 week   PT Treatment/Interventions ADLs/Self Care Home Management;Aquatic Therapy;DME Instruction;Neuromuscular re-education;Balance training;Therapeutic exercise;Therapeutic activities;Functional mobility training;Gait training;Patient/family education;Manual techniques;Passive range of motion    PT Next Visit Plan discharge per primary PT plan of care    PT Home Exercise Plan Access Code: [email or web address]    Consulted and Agree with Plan of Care Patient             Patient will benefit from skilled therapeutic intervention in order to improve the following deficits and  impairments:  Abnormal gait, Decreased range of motion, Pain, Decreased balance, Impaired flexibility, Decreased mobility, Decreased strength, Postural dysfunction  Visit Diagnosis: Other abnormalities of gait and mobility  Unsteadiness on feet  Muscle weakness (generalized)     Problem List Patient Active Problem List   Diagnosis Date Noted   PSVT (paroxysmal supraventricular tachycardia) ([place]) 11/26/2020   Syncope and collapse 11/24/2020   Primary osteoarthritis of right knee 05/12/2020   Primary osteoarthritis of left knee 05/12/2020   Hyperlipidemia    Bilateral calf pain 03/05/2019   Genetic testing 02/13/2019   Family history of ovarian cancer    Family history of bladder cancer    Family history of colon cancer    Family history of leukemia    Lumbar radiculopathy 12/08/2018   Myofascial pain 12/08/2018   Impaired gait and mobility 12/08/2018   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   Chronic nonallergic rhinitis 12/29/2016   Mild persistent asthma, uncomplicated 48/84/5733   Vocal fold paralysis, 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

## 2021-01-13 NOTE — Therapy (Signed)
[facility] [address] [place], [place], [address] Phone: [phone]   Fax:  [phone]  Patient Details  Name: April Church MRN: [id number] Date of Birth: Sep 23, 1940 Referring Provider:  Curt Bears., MD  Encounter Date: 01/13/2021    Speech Therapy Parkinson's Disease Screen   Decibel Level today: low 70s dB  (WNL=70-72 dB) with sound level meter 30cm away from pt's masked mouth. Pt's conversational volume has remained unchanged since last ST screener. Pt presents with voice tremor, in which pt most recently received vocal cord injection in August 2021. Pt might seek further ENT treatment when able. Pt denied any overt concerns with voice or speech at this time.   Pt does not report further difficulty with swallowing at this time, as pt is using small sips for thin liquids per recommendation at last ST screening. Objective swallow study was recommended at last screening, which pt did not pursue at that time. Pt would likely benefit from repeat swallow study. SLP recommended pt contact ENT or PCP if swallow function worsens or increased coughing noted.   Pt may require speech therapy - to be determined after ENT visit at [facility] for consult about botox injection for vocal tremor. SLP cont to recommend objective swallow evaluation as suggested above. Swallowing therapy may be recommended after the objective swallowing evaluation and could be completed at this center.  Recommend ST screen in another 6-8 months if pt elects not to obtain objective swallow study or consult ENT.   Alinda Deem, MA CCC-SLP 01/13/2021, 2:09 PM  [facility] [address] [place], [place], [address] Phone: [phone]   Fax:  [phone]

## 2021-02-07 DIAGNOSIS — Z20828 Contact with and (suspected) exposure to other viral communicable diseases: Secondary | ICD-10-CM | POA: Diagnosis not present

## 2021-02-09 ENCOUNTER — Ambulatory Visit: Payer: Medicare Other | Admitting: Diagnostic Neuroimaging

## 2021-02-24 NOTE — Patient Instructions (Addendum)
Asthma Continue Symbicort 160/4.5 mcg  2 puffs twice a day with spacer to help prevent cough and wheeze.  Make sure you are using the proper technique for inhaler with spacer that we taught you in the office today.  If you continue to have symptoms and your spirometry is down at the next office visit we will consider starting Breztri Continue Proventil HFA 2 puff into the lungs every 4-6 hours as needed for shortness of breath or wheezing You may use albuterol 2 puffs 5 to 15 minutes before activity to decrease cough or wheeze  Chronic nonallergic rhinitis Continue Xyzal 5 mg once a day as needed for a runny nose. Remember to rotate to a different antihistamine about every 3 months. Some examples of over the counter antihistamines include Zyrtec (cetirizine), Xyzal (levocetirizine), Allegra (fexofenadine), and Claritin (loratidine).  Continue Atrovent 0.3% use 2 sprays in each nostril twice a day as needed for a runny nose Stop Astelin as she is not using this medication.   Vocal fold paralysis, bilateral Continue routine follow-up with Dr. Joya Gaskins with ENT for periodic Botox injections as prescribed. Speak with your daughter about seeing if you can stay with her after getting your Botox injections  GERD Continue famotidine 20 mg once a day as you have been Continue dietary and lifestyle modifications as listed below  Food allergy Continue avoidance of shellfish and wine  In case of an allergic reaction, take Benadryl 50 mg every 4 hours, and if life-threatening symptoms occur, inject with EpiPen 0.3 mg.  Follow up in 4 weeks or sooner if needed   Lifestyle Changes for Controlling GERD When you have GERD, stomach acid feels as if its backing up toward your mouth. Whether or not you take medication to control your GERD, your symptoms can often be improved with lifestyle changes.   Raise Your Head Reflux is more likely to strike when youre lying down flat, because stomach fluid can flow  backward more easily. Raising the head of your bed 4-6 inches can help. To do this: Slide blocks or books under the legs at the head of your bed. Or, place a wedge under the mattress. Many foam stores can make a suitable wedge for you. The wedge should run from your waist to the top of your head. Dont just prop your head on several pillows. This increases pressure on your stomach. It can make GERD worse.  Watch Your Eating Habits Certain foods may increase the acid in your stomach or relax the lower esophageal sphincter, making GERD more likely. Its best to avoid the following: Coffee, tea, and carbonated drinks (with and without caffeine) Fatty, fried, or spicy food Mint, chocolate, onions, and tomatoes Any other foods that seem to irritate your stomach or cause you pain  Relieve the Pressure Eat smaller meals, even if you have to eat more often. Dont lie down right after you eat. Wait a few hours for your stomach to empty. Avoid tight belts and tight-fitting clothes. Lose excess weight.  Tobacco and Alcohol Avoid smoking tobacco and drinking alcohol. They can make GERD symptoms worse.

## 2021-02-25 ENCOUNTER — Encounter: Payer: Self-pay | Admitting: Family

## 2021-02-25 ENCOUNTER — Other Ambulatory Visit: Payer: Self-pay

## 2021-02-25 ENCOUNTER — Ambulatory Visit (INDEPENDENT_AMBULATORY_CARE_PROVIDER_SITE_OTHER): Payer: Medicare Other | Admitting: Family

## 2021-02-25 VITALS — BP 124/90 | HR 118 | Temp 98.0°F | Resp 16 | Ht 60.0 in | Wt 123.6 lb

## 2021-02-25 DIAGNOSIS — J4540 Moderate persistent asthma, uncomplicated: Secondary | ICD-10-CM

## 2021-02-25 DIAGNOSIS — J310 Chronic rhinitis: Secondary | ICD-10-CM

## 2021-02-25 DIAGNOSIS — J3802 Paralysis of vocal cords and larynx, bilateral: Secondary | ICD-10-CM | POA: Diagnosis not present

## 2021-02-25 DIAGNOSIS — K219 Gastro-esophageal reflux disease without esophagitis: Secondary | ICD-10-CM

## 2021-02-25 DIAGNOSIS — T782XXD Anaphylactic shock, unspecified, subsequent encounter: Secondary | ICD-10-CM

## 2021-02-25 MED ORDER — FAMOTIDINE 20 MG PO TABS: 20.0000 mg | ORAL_TABLET | Freq: Every day | ORAL | 5 refills | Status: DC

## 2021-02-25 MED ORDER — ALBUTEROL SULFATE HFA 108 (90 BASE) MCG/ACT IN AERS: 1.0000 | INHALATION_SPRAY | Freq: Four times a day (QID) | RESPIRATORY_TRACT | 1 refills | Status: DC | PRN

## 2021-02-25 MED ORDER — LEVOCETIRIZINE DIHYDROCHLORIDE 5 MG PO TABS: ORAL_TABLET | ORAL | 5 refills | Status: DC

## 2021-02-25 MED ORDER — IPRATROPIUM BROMIDE 0.03 % NA SOLN: 2.0000 | Freq: Two times a day (BID) | NASAL | 5 refills | Status: DC

## 2021-02-25 MED ORDER — EPINEPHRINE 0.3 MG/0.3ML IJ SOAJ: 0.3000 mg | INTRAMUSCULAR | 1 refills | Status: DC | PRN

## 2021-02-25 NOTE — Progress Notes (Signed)
[facility] [place] [address] Dept: [phone]  FOLLOW UP NOTE  Patient ID: April Church, female    DOB: Jul 01, 1940  Age: 81 y.o. MRN: [id number] Date of Office Visit: 02/25/2021  Assessment  Chief Complaint: Allergic Rhinitis  (Doing ok. Had a cold couple of weeks ago. Always hoarse because of vocal cord issue.)  HPI April Church is an 81 year old female who presents today for follow-up of asthma, chronic nonallergic rhinitis, bilateral vocal fold paralysis, gastroesophageal reflux disease, and food allergy.  She was last seen on April 24, 2020 by Gareth Morgan, FNP.  Since her last office visit she denies any new diagnosis or surgery.  She does mention that her husband passed away in 05/21/20.  Asthma is reported as not well controlled with Symbicort 160/4.5 mcg 2 puffs twice a day with spacer and albuterol as needed.  She reports that approximately 2 weeks ago she had a cold and developed a dry cough and wheezing.  The dry cough and wheezing have gotten better but are still present.  She denies tightness in her chest, shortness of breath, fever, chills, and nocturnal awakenings due to breathing problems.  Since her last office visit she has not required any systemic steroids and has not made any trips to the emergency room or urgent care due to breathing problems.  She uses her albuterol inhaler approximately once a month.  She reports that she has not seen Dr. Joya Gaskins, her ENT, for Botox injections for vocal fold paralysis in at least 6 months.  She mentions that she tries to stretch them out as far as she can.  Since her husband passed away in 05/22/2022 she lives alone and when she has to get Botox injections she cannot talk for approximately 2 weeks and makes it difficult to answer the phone.  She is going to talk to her daughter that lives in [place] and see if she could potentially stay with her during that timeframe.  She can tell that she is due for a Botox  injection.  Chronic nonallergic rhinitis is reported as moderately controlled with Xyzal 5 mg once a day and Atrovent nasal spray as needed.  She is not using Astelin nasal spray.  She reports some postnasal drip and denies rhinorrhea and nasal congestion.  She has not had any sinus infections since we last saw her.  She continues to avoid shellfish and wine without any accidental ingestion or use of her EpiPen.  She mentions that she does need a refill on her EpiPen.  Gastroesophageal reflux disease is reported as moderately controlled with famotidine 20 mg once a day.  She reports that every once in a while she will have reflux symptoms but not that much.   Drug Allergies:  Allergies  Allergen Reactions   Shellfish Allergy Shortness Of Breath   Duloxetine Itching and Rash   Sulfa Drugs Cross Reactors Anxiety and Rash   Lisinopril     Other reaction(s): cough Other reaction(s): cough   Lyrica [Pregabalin] Other (See Comments)    "made me drunk, couldn't function"   Other     Other reaction(s): hyper   Sulfa Antibiotics     Other reaction(s): hyper, break out in a rash Other reaction(s): hyper, break out in a rash   Codeine Anxiety    Other reaction(s): hyper    Review of Systems: Review of Systems  Constitutional:  Negative for chills and fever.  HENT:  Reports some postnasal drip and denies rhinorrhea and nasal congestion  Eyes:        Denies itchy watery eyes  Respiratory:  Positive for cough and wheezing. Negative for shortness of breath.        Reports dry cough and wheeze that started approximately 2 weeks ago when she had a cold it is better but still there.  Denies tightness in chest, shortness of breath, and nocturnal awakenings due to breathing problems  Cardiovascular:  Negative for chest pain and palpitations.  Gastrointestinal:        Reports occasional reflux once in a while but not that much.  Currently on famotidine 20 mg once a day.  Genitourinary:   Negative for frequency.  Skin:  Negative for itching and rash.  Neurological:  Negative for headaches.  Endo/Heme/Allergies:  Negative for environmental allergies.    Physical Exam: BP 124/90    Pulse (!) 118    Temp 98 F (36.7 C) (Temporal)    Resp 16    Ht 5' (1.524 m)    Wt 123 lb 9.6 oz (56.1 kg)    SpO2 100%    BMI 24.14 kg/m    Physical Exam Constitutional:      Appearance: Normal appearance.  HENT:     Head: Normocephalic and atraumatic.     Comments: Pharynx normal, eyes normal, ears normal, nose normal    Right Ear: Tympanic membrane, ear canal and external ear normal.     Left Ear: Tympanic membrane, ear canal and external ear normal.     Nose: Nose normal.     Mouth/Throat:     Mouth: Mucous membranes are moist.     Pharynx: Oropharynx is clear.  Eyes:     Conjunctiva/sclera: Conjunctivae normal.  Cardiovascular:     Rate and Rhythm: Regular rhythm.     Heart sounds: Normal heart sounds.  Pulmonary:     Effort: Pulmonary effort is normal.     Breath sounds: Normal breath sounds.     Comments: Lungs clear to auscultation Musculoskeletal:     Cervical back: Neck supple.  Skin:    General: Skin is warm.  Neurological:     Mental Status: She is alert and oriented to person, place, and time.  Psychiatric:        Mood and Affect: Mood normal.        Behavior: Behavior normal.        Thought Content: Thought content normal.        Judgment: Judgment normal.    Diagnostics: FVC 1.30 L, FEV1 0.96 L.  Predicted FVC 2.17 L, predicted FEV1 1.66 L.  Spirometry indicates possible moderately severe restriction.  Post bronchodilator response shows FVC 1.50 L, FEV1 1.09 L.  There is a 14% change in FEV1.  Spirometry indicates possible moderate restriction.  While giving April Church the 4 puffs of Xopenex it was found that she had improper technique with inhaler and spacer.  Proper technique gone over with April Church.  She reports that her breathing feels better after the 4  puffs of Xopenex  Assessment and Plan: 1. Not well controlled moderate persistent asthma   2. Chronic nonallergic rhinitis   3. Anaphylaxis, subsequent encounter   4. Gastroesophageal reflux disease, unspecified whether esophagitis present   5. Vocal fold paralysis, bilateral     No orders of the defined types were placed in this encounter.   Patient Instructions  Asthma Continue Symbicort 160/4.5 mcg  2 puffs twice a day with  spacer to help prevent cough and wheeze.  Make sure you are using the proper technique for inhaler with spacer that we taught you in the office today.  If you continue to have symptoms and your spirometry is down at the next office visit we will consider starting Breztri Continue Proventil HFA 2 puff into the lungs every 4-6 hours as needed for shortness of breath or wheezing You may use albuterol 2 puffs 5 to 15 minutes before activity to decrease cough or wheeze  Chronic nonallergic rhinitis Continue Xyzal 5 mg once a day as needed for a runny nose. Remember to rotate to a different antihistamine about every 3 months. Some examples of over the counter antihistamines include Zyrtec (cetirizine), Xyzal (levocetirizine), Allegra (fexofenadine), and Claritin (loratidine).  Continue Atrovent 0.3% use 2 sprays in each nostril twice a day as needed for a runny nose Stop Astelin as she is not using this medication.   Vocal fold paralysis, bilateral Continue routine follow-up with Dr. Joya Gaskins with ENT for periodic Botox injections as prescribed. Speak with your daughter about seeing if you can stay with her after getting your Botox injections  GERD Continue famotidine 20 mg once a day as you have been Continue dietary and lifestyle modifications as listed below  Food allergy Continue avoidance of shellfish and wine  In case of an allergic reaction, take Benadryl 50 mg every 4 hours, and if life-threatening symptoms occur, inject with EpiPen 0.3 mg.  Follow up in 4  weeks or sooner if needed   Lifestyle Changes for Controlling GERD When you have GERD, stomach acid feels as if its backing up toward your mouth. Whether or not you take medication to control your GERD, your symptoms can often be improved with lifestyle changes.   Raise Your Head Reflux is more likely to strike when youre lying down flat, because stomach fluid can flow backward more easily. Raising the head of your bed 4-6 inches can help. To do this: Slide blocks or books under the legs at the head of your bed. Or, place a wedge under the mattress. Many foam stores can make a suitable wedge for you. The wedge should run from your waist to the top of your head. Dont just prop your head on several pillows. This increases pressure on your stomach. It can make GERD worse.  Watch Your Eating Habits Certain foods may increase the acid in your stomach or relax the lower esophageal sphincter, making GERD more likely. Its best to avoid the following: Coffee, tea, and carbonated drinks (with and without caffeine) Fatty, fried, or spicy food Mint, chocolate, onions, and tomatoes Any other foods that seem to irritate your stomach or cause you pain  Relieve the Pressure Eat smaller meals, even if you have to eat more often. Dont lie down right after you eat. Wait a few hours for your stomach to empty. Avoid tight belts and tight-fitting clothes. Lose excess weight.  Tobacco and Alcohol Avoid smoking tobacco and drinking alcohol. They can make GERD symptoms worse.    Return in about 4 weeks (around 03/25/2021), or if symptoms worsen or fail to improve.    Thank you for the opportunity to care for this patient.  Please do not hesitate to contact me with questions.  Althea Charon, FNP [facility]

## 2021-02-27 DIAGNOSIS — H04123 Dry eye syndrome of bilateral lacrimal glands: Secondary | ICD-10-CM | POA: Diagnosis not present

## 2021-02-27 DIAGNOSIS — Z961 Presence of intraocular lens: Secondary | ICD-10-CM | POA: Diagnosis not present

## 2021-03-17 DIAGNOSIS — Z1231 Encounter for screening mammogram for malignant neoplasm of breast: Secondary | ICD-10-CM | POA: Diagnosis not present

## 2021-03-27 ENCOUNTER — Encounter: Payer: Self-pay | Admitting: Family Medicine

## 2021-03-27 ENCOUNTER — Ambulatory Visit (INDEPENDENT_AMBULATORY_CARE_PROVIDER_SITE_OTHER): Payer: Medicare Other | Admitting: Family Medicine

## 2021-03-27 ENCOUNTER — Other Ambulatory Visit: Payer: Self-pay

## 2021-03-27 VITALS — BP 118/62 | HR 93 | Resp 16

## 2021-03-27 DIAGNOSIS — K219 Gastro-esophageal reflux disease without esophagitis: Secondary | ICD-10-CM

## 2021-03-27 DIAGNOSIS — J4540 Moderate persistent asthma, uncomplicated: Secondary | ICD-10-CM | POA: Diagnosis not present

## 2021-03-27 DIAGNOSIS — J3802 Paralysis of vocal cords and larynx, bilateral: Secondary | ICD-10-CM

## 2021-03-27 DIAGNOSIS — J310 Chronic rhinitis: Secondary | ICD-10-CM

## 2021-03-27 MED ORDER — SYMBICORT 160-4.5 MCG/ACT IN AERO: INHALATION_SPRAY | RESPIRATORY_TRACT | 5 refills | Status: DC

## 2021-03-27 NOTE — Addendum Note (Signed)
Addended by: Glendell Docker on: 03/27/2021 03:48 PM   Modules accepted: Orders

## 2021-03-27 NOTE — Patient Instructions (Addendum)
Asthma Continue Symbicort 160/4.5 mcg  2 puffs twice a day with spacer to help prevent cough and wheeze.  Continue albuterol 2 puff into the lungs every 4-6 hours as needed for shortness of breath or wheezing You may use albuterol 2 puffs 5 to 15 minutes before activity to decrease cough or wheeze  Chronic nonallergic rhinitis Continue Xyzal 5 mg once a day as needed for a runny nose. Remember to rotate to a different antihistamine about every 3 months. Some examples of over the counter antihistamines include Zyrtec (cetirizine), Xyzal (levocetirizine), Allegra (fexofenadine), and Claritin (loratidine).  Continue Atrovent 0.3% use 2 sprays in each nostril twice a day as needed for a runny nose Consider saline nasal rinses as needed for nasal symptoms. Use this before any medicated nasal sprays for best result  Vocal fold paralysis, bilateral Continue routine follow-up with Dr. Joya Gaskins with ENT for periodic Botox injections as prescribed. Speak with your daughter about seeing if you can stay with her after getting your Botox injections. Possibly reach out to [employer] and find out if they have a service you can use for the 2 weeks you can not speak after your Botox injections  GERD Continue famotidine 20 mg once a day as you have been Continue dietary and lifestyle modifications as listed below  Food allergy Continue avoidance of shellfish and wine  In case of an allergic reaction, take Benadryl 50 mg every 4 hours, and if life-threatening symptoms occur, inject with EpiPen 0.3 mg.  Follow up in 3 months or sooner if needed   Lifestyle Changes for Controlling GERD When you have GERD, stomach acid feels as if its backing up toward your mouth. Whether or not you take medication to control your GERD, your symptoms can often be improved with lifestyle changes.   Raise Your Head Reflux is more likely to strike when youre lying down flat, because stomach fluid can flow backward more  easily. Raising the head of your bed 4-6 inches can help. To do this: Slide blocks or books under the legs at the head of your bed. Or, place a wedge under the mattress. Many foam stores can make a suitable wedge for you. The wedge should run from your waist to the top of your head. Dont just prop your head on several pillows. This increases pressure on your stomach. It can make GERD worse.  Watch Your Eating Habits Certain foods may increase the acid in your stomach or relax the lower esophageal sphincter, making GERD more likely. Its best to avoid the following: Coffee, tea, and carbonated drinks (with and without caffeine) Fatty, fried, or spicy food Mint, chocolate, onions, and tomatoes Any other foods that seem to irritate your stomach or cause you pain  Relieve the Pressure Eat smaller meals, even if you have to eat more often. Dont lie down right after you eat. Wait a few hours for your stomach to empty. Avoid tight belts and tight-fitting clothes. Lose excess weight.  Tobacco and Alcohol Avoid smoking tobacco and drinking alcohol. They can make GERD symptoms worse.

## 2021-03-27 NOTE — Progress Notes (Signed)
[facility] [address] Dept: [phone]  FOLLOW UP NOTE  Patient ID: April Church, female    DOB: 01-21-1941  Age: 81 y.o. MRN: [id number] Date of Office Visit: 03/27/2021  Assessment  Chief Complaint: Allergic Rhinitis , Asthma, Food Allergy (Shellfish and wine), and Gastroesophageal Reflux  HPI April Church is an 81 year old female who presents the clinic for follow-up visit.  She was last seen in this clinic on 02/25/2021 by Althea Charon, FNP, for evaluation of asthma, chronic rhinitis, reflux, and food allergy to shellfish and wine.  Her history is significant for bilateral vocal cord paralysis for which she receives Botox injections from Dr. Joya Gaskins, ENT.  At today's visit, she reports her asthma has been well controlled with no shortness of breath, cough, or wheeze with activity or rest.  She continues Symbicort 160-2 puffs twice a day with a spacer and uses albuterol about once every couple months with relief of symptoms.  She does report that she occasionally forgets her nighttime dose of Symbicort.  Chronic rhinitis is reported as well controlled with no symptoms including rhinorrhea, nasal congestion, sneeze, or postnasal drainage.  She continues azelastine daily and Xyzal daily.  She is not currently using a nasal saline rinse.  Reflux is reported as well controlled with no symptoms including heartburn or vomiting.  She continues famotidine once a day.  She does report that she has not been to get her Botox injections in about 1 year.  She reports that after she gets the Botox injections she cannot speak for about 2 weeks.  She reports that her husband has recently passed and she is worried about not being able to communicate after receiving this injection. Her current medications are listed in the chart.   Drug Allergies:  Allergies  Allergen Reactions   Shellfish Allergy Shortness Of Breath   Duloxetine Itching and Rash   Sulfa Drugs Cross  Reactors Anxiety and Rash   Lisinopril     Other reaction(s): cough Other reaction(s): cough   Lyrica [Pregabalin] Other (See Comments)    "made me drunk, couldn't function"   Other     Other reaction(s): hyper   Sulfa Antibiotics     Other reaction(s): hyper, break out in a rash Other reaction(s): hyper, break out in a rash   Codeine Anxiety    Other reaction(s): hyper    Physical Exam: BP 118/62    Pulse 93    Resp 16    SpO2 98%    Physical Exam Vitals reviewed.  Constitutional:      Appearance: Normal appearance.  HENT:     Head: Normocephalic and atraumatic.     Right Ear: Tympanic membrane normal.     Left Ear: Tympanic membrane normal.     Nose:     Comments: Bilateral nares slightly erythematous with clear nasal drainage noted.  Pharynx normal.  Ears normal.  Eyes normal.    Mouth/Throat:     Pharynx: Oropharynx is clear.  Eyes:     Conjunctiva/sclera: Conjunctivae normal.  Cardiovascular:     Rate and Rhythm: Normal rate and regular rhythm.     Heart sounds: Normal heart sounds. No murmur heard. Pulmonary:     Effort: Pulmonary effort is normal.     Breath sounds: Normal breath sounds.     Comments: Lungs clear to auscultation Musculoskeletal:        General: Normal range of motion.     Cervical back: Normal range of motion  and neck supple.  Skin:    General: Skin is warm and dry.  Neurological:     Mental Status: She is alert and oriented to person, place, and time.  Psychiatric:        Mood and Affect: Mood normal.        Behavior: Behavior normal.        Thought Content: Thought content normal.        Judgment: Judgment normal.    Diagnostics: FVC 1.25, FEV1 0.85.  Predicted FVC 2.17, predicted FEV1 1.66.  Spirometry indicates restriction.  This is consistent with previous spirometry readings.  Assessment and Plan: 1. Moderate persistent asthma without complication   2. Chronic nonallergic rhinitis   3. Vocal fold paralysis, bilateral   4.  Gastroesophageal reflux disease, unspecified whether esophagitis present     Meds ordered this encounter  Medications   SYMBICORT 160-4.5 MCG/ACT inhaler    Sig: Inhale two puffs twice daily to prevent cough or wheeze. Rinse mouth after use.    Dispense:  10.2 g    Refill:  5    Patient Instructions  Asthma Continue Symbicort 160/4.5 mcg  2 puffs twice a day with spacer to help prevent cough and wheeze.  Continue albuterol 2 puff into the lungs every 4-6 hours as needed for shortness of breath or wheezing You may use albuterol 2 puffs 5 to 15 minutes before activity to decrease cough or wheeze  Chronic nonallergic rhinitis Continue Xyzal 5 mg once a day as needed for a runny nose. Remember to rotate to a different antihistamine about every 3 months. Some examples of over the counter antihistamines include Zyrtec (cetirizine), Xyzal (levocetirizine), Allegra (fexofenadine), and Claritin (loratidine).  Continue Atrovent 0.3% use 2 sprays in each nostril twice a day as needed for a runny nose Consider saline nasal rinses as needed for nasal symptoms. Use this before any medicated nasal sprays for best result  Vocal fold paralysis, bilateral Continue routine follow-up with Dr. Joya Gaskins with ENT for periodic Botox injections as prescribed. Speak with your daughter about seeing if you can stay with her after getting your Botox injections. Possibly reach out to [employer] and find out if they have a service you can use for the 2 weeks you can not speak after your Botox injections  GERD Continue famotidine 20 mg once a day as you have been Continue dietary and lifestyle modifications as listed below  Food allergy Continue avoidance of shellfish and wine  In case of an allergic reaction, take Benadryl 50 mg every 4 hours, and if life-threatening symptoms occur, inject with EpiPen 0.3 mg.  Follow up in 3 months or sooner if needed    Return in about 3 months (around 06/24/2021), or if  symptoms worsen or fail to improve.    Thank you for the opportunity to care for this patient.  Please do not hesitate to contact me with questions.  Gareth Morgan, FNP [facility]

## 2021-04-03 DIAGNOSIS — I10 Essential (primary) hypertension: Secondary | ICD-10-CM | POA: Diagnosis not present

## 2021-04-03 DIAGNOSIS — E785 Hyperlipidemia, unspecified: Secondary | ICD-10-CM | POA: Diagnosis not present

## 2021-04-10 ENCOUNTER — Encounter: Payer: Self-pay | Admitting: Physical Medicine and Rehabilitation

## 2021-04-10 ENCOUNTER — Other Ambulatory Visit: Payer: Self-pay

## 2021-04-10 ENCOUNTER — Encounter
Payer: Medicare Other | Attending: Physical Medicine and Rehabilitation | Admitting: Physical Medicine and Rehabilitation

## 2021-04-10 VITALS — BP 131/87 | HR 77 | Temp 97.9°F | Ht 60.0 in | Wt 124.4 lb

## 2021-04-10 DIAGNOSIS — M1711 Unilateral primary osteoarthritis, right knee: Secondary | ICD-10-CM

## 2021-04-10 DIAGNOSIS — M1712 Unilateral primary osteoarthritis, left knee: Secondary | ICD-10-CM | POA: Diagnosis not present

## 2021-04-10 DIAGNOSIS — M5416 Radiculopathy, lumbar region: Secondary | ICD-10-CM | POA: Diagnosis not present

## 2021-04-10 NOTE — Progress Notes (Signed)
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 09/18/19 Back injection- R L4-5 transforminal.  epidural steroid injection.  Here for f/u for chronic pain and B/L knee injections.     Wants knee injections  Sometimes, everything else hurts-  Hands also hurts then goes away - in th ejoints- esp CMC joints.  Uses Voltaren gel- as needed- ~ 2x/day-  Otherwise, no major issues.   Went to Neurology- wondering why legs hurt-  Takes tylenol mainly- cannot take NSAIDs due to kidney issues.  If takes 2 when goes ot bed, it makes a difference. Being on feet, feels better than when in bed.   Doesn't want anything else/pain meds  Therapy ran out and joined [facility].  Now on own- ran out of individual sessions.  Finds using machines, makes calves and knees hurt worse.  The treadmill- increases risk of fall.  Does the bike- (has treadmill at home- needs to stay off it).   Injections last great for 6 weeks- then start to wear off- and gone by 2 months.   Exam: Awake, alert, quavery voice- chronic, NAD B/L knee effusions- more notable on L.    Plan: Can use voltaren gel up to 4x/day. - I find it works better if used more regularly.   2.  Using too much weight with machines- no more 10 reps- 1 set; and less weight. When goes to [facility]  3. Doesn't want any other pain meds- I'm fine with this- but if things get worse, CALL ME!   4. steroid injection was performed at B/L knee steroid injections using 1% plain Lidocaine and 40mg  /1cc of Kenalog. This was well tolerated.  Cleaned with betadine x3 and allowed to dry- then alcohol then injected using 27 gauge 1.5 inch needle- no bleeding or complications.    F/U in 3 months for steroid injections of B/L  knee steroid injections Lidocaine will kick in 15 minutes- and wear off tonight- the steroid will kick in tomorrow within 24 hours and take up to 72 hours to fully kick in.   5. F/U in 3 months for B/L knee -celestone injections.

## 2021-04-10 NOTE — Patient Instructions (Signed)
Plan: Can use voltaren gel up to 4x/day. - I find it works better if used more regularly.   2.  Using too much weight with machines- no more 10 reps- 1 set; and less weight. When goes to [facility]  3. Doesn't want any other pain meds- I'm fine with this- but if things get worse, CALL ME!   4. steroid injection was performed at B/L knee steroid injections using 1% plain Lidocaine and 40mg  /1cc of Kenalog. This was well tolerated.  Cleaned with betadine x3 and allowed to dry- then alcohol then injected using 27 gauge 1.5 inch needle- no bleeding or complications.    F/U in 3 months for steroid injections of B/L knee steroid injections Lidocaine will kick in 15 minutes- and wear off tonight- the steroid will kick in tomorrow within 24 hours and take up to 72 hours to fully kick in.   5. F/U in 3 months for B/L knee -celestone injections.

## 2021-04-24 ENCOUNTER — Other Ambulatory Visit: Payer: Self-pay

## 2021-04-24 ENCOUNTER — Telehealth: Payer: Self-pay | Admitting: Cardiology

## 2021-04-24 MED ORDER — AMLODIPINE BESYLATE 2.5 MG PO TABS: ORAL_TABLET | ORAL | 6 refills | Status: DC

## 2021-04-24 NOTE — Telephone Encounter (Signed)
Spoke with patient who wanted the 2.5 mg not 5 mg tablets of amlodipine because she did not want to cut the pills in half. I refilled prescription for amlodipine 2.5 mg tablets for her to take once a day. ?

## 2021-04-24 NOTE — Telephone Encounter (Signed)
Pt c/o medication issue: ? ?1. Name of Medication: amLODipine (NORVASC) 2.5 MG tablet ? ?2. How are you currently taking this medication (dosage and times per day)? 1 tablet ? ?3. Are you having a reaction (difficulty breathing--STAT)? no ? ?4. What is your medication issue? Patient ask that she get the tablets where she doesn't have to cut in in half. She needs a prescription for it as well. Please advise ? ?

## 2021-06-08 DIAGNOSIS — Z0000 Encounter for general adult medical examination without abnormal findings: Secondary | ICD-10-CM | POA: Diagnosis not present

## 2021-06-08 DIAGNOSIS — F3341 Major depressive disorder, recurrent, in partial remission: Secondary | ICD-10-CM | POA: Diagnosis not present

## 2021-06-08 DIAGNOSIS — K649 Unspecified hemorrhoids: Secondary | ICD-10-CM | POA: Diagnosis not present

## 2021-06-08 DIAGNOSIS — Z1331 Encounter for screening for depression: Secondary | ICD-10-CM | POA: Diagnosis not present

## 2021-06-08 DIAGNOSIS — G20 Parkinson's disease: Secondary | ICD-10-CM | POA: Diagnosis not present

## 2021-06-08 DIAGNOSIS — J45909 Unspecified asthma, uncomplicated: Secondary | ICD-10-CM | POA: Diagnosis not present

## 2021-06-08 DIAGNOSIS — R8290 Unspecified abnormal findings in urine: Secondary | ICD-10-CM | POA: Diagnosis not present

## 2021-06-08 DIAGNOSIS — I10 Essential (primary) hypertension: Secondary | ICD-10-CM | POA: Diagnosis not present

## 2021-06-08 DIAGNOSIS — M4726 Other spondylosis with radiculopathy, lumbar region: Secondary | ICD-10-CM | POA: Diagnosis not present

## 2021-06-08 DIAGNOSIS — C4491 Basal cell carcinoma of skin, unspecified: Secondary | ICD-10-CM | POA: Diagnosis not present

## 2021-06-08 DIAGNOSIS — E785 Hyperlipidemia, unspecified: Secondary | ICD-10-CM | POA: Diagnosis not present

## 2021-06-08 DIAGNOSIS — Z7189 Other specified counseling: Secondary | ICD-10-CM | POA: Diagnosis not present

## 2021-06-22 DIAGNOSIS — E785 Hyperlipidemia, unspecified: Secondary | ICD-10-CM | POA: Diagnosis not present

## 2021-06-22 DIAGNOSIS — F3341 Major depressive disorder, recurrent, in partial remission: Secondary | ICD-10-CM | POA: Diagnosis not present

## 2021-06-22 DIAGNOSIS — J45909 Unspecified asthma, uncomplicated: Secondary | ICD-10-CM | POA: Diagnosis not present

## 2021-06-22 DIAGNOSIS — M4726 Other spondylosis with radiculopathy, lumbar region: Secondary | ICD-10-CM | POA: Diagnosis not present

## 2021-06-22 DIAGNOSIS — I10 Essential (primary) hypertension: Secondary | ICD-10-CM | POA: Diagnosis not present

## 2021-06-23 DIAGNOSIS — Z20822 Contact with and (suspected) exposure to covid-19: Secondary | ICD-10-CM | POA: Diagnosis not present

## 2021-06-30 DIAGNOSIS — L570 Actinic keratosis: Secondary | ICD-10-CM | POA: Diagnosis not present

## 2021-06-30 DIAGNOSIS — L821 Other seborrheic keratosis: Secondary | ICD-10-CM | POA: Diagnosis not present

## 2021-07-14 ENCOUNTER — Ambulatory Visit: Payer: Medicare Other | Attending: Physical Medicine and Rehabilitation

## 2021-07-14 ENCOUNTER — Ambulatory Visit: Payer: Medicare Other | Admitting: Physical Therapy

## 2021-07-14 ENCOUNTER — Ambulatory Visit (INDEPENDENT_AMBULATORY_CARE_PROVIDER_SITE_OTHER): Payer: Medicare Other | Admitting: Cardiology

## 2021-07-14 ENCOUNTER — Ambulatory Visit: Payer: Medicare Other | Admitting: Occupational Therapy

## 2021-07-14 ENCOUNTER — Encounter: Payer: Self-pay | Admitting: Cardiology

## 2021-07-14 VITALS — BP 134/80 | HR 75 | Ht 60.0 in | Wt 126.2 lb

## 2021-07-14 DIAGNOSIS — R471 Dysarthria and anarthria: Secondary | ICD-10-CM | POA: Insufficient documentation

## 2021-07-14 DIAGNOSIS — R2689 Other abnormalities of gait and mobility: Secondary | ICD-10-CM | POA: Insufficient documentation

## 2021-07-14 DIAGNOSIS — R278 Other lack of coordination: Secondary | ICD-10-CM | POA: Insufficient documentation

## 2021-07-14 DIAGNOSIS — R55 Syncope and collapse: Secondary | ICD-10-CM | POA: Diagnosis not present

## 2021-07-14 DIAGNOSIS — I471 Supraventricular tachycardia: Secondary | ICD-10-CM | POA: Diagnosis not present

## 2021-07-14 DIAGNOSIS — E7849 Other hyperlipidemia: Secondary | ICD-10-CM

## 2021-07-14 MED ORDER — METOPROLOL SUCCINATE ER 25 MG PO TB24: 25.0000 mg | ORAL_TABLET | Freq: Every day | ORAL | 3 refills | Status: DC

## 2021-07-14 MED ORDER — AMLODIPINE BESYLATE 2.5 MG PO TABS: ORAL_TABLET | ORAL | 3 refills | Status: AC

## 2021-07-14 NOTE — Therapy (Signed)
Occupational Therapy Parkinson's Disease Screen  Hand dominance:  RUE   Physical Performance Test item #2 (simulated eating):  16.88 sec   Fastening/unfastening 3 buttons in:  30.82 sec  9-hole peg test:    RUE  40.34 sec        LUE  32.28 sec  Box & Blocks Test:   RUE  33 blocks        LUE  39 blocks    Change in ability to perform ADLs/IADLs:  Pt demonstrates mild overall slowing, however she reports she has a lot coming up and would prefer to return for screens in 6 mons  Other Comments:  Legible, mildly decreased legibility    Pt would like to delay occupational therapy services at this time.  Recommended occupational therapy screen in   6 mons

## 2021-07-14 NOTE — Progress Notes (Signed)
Primary Care Provider: Leeroy Cha, MD Cardiologist: April Hew, MD Electrophysiologist: None  Clinic Note: Chief Complaint  Patient presents with   Follow-up    Doing much better.  No further syncope or collapse.  Palpitations well controlled on low-dose Toprol.  Gaining strength.   Palpitations    Well-controlled   Loss of Consciousness    None since last visit    ===================================  ASSESSMENT/PLAN   Problem List Items Addressed This Visit       Cardiology Problems   Hyperlipidemia (Chronic)    Given her advanced age, and musculoskeletal issues as well as borderline memory issues, would not titrate statin any further.  If LDL goes wrong direction, would probably switch to rosuvastatin but would not increase atorvastatin dose.      Relevant Medications   amLODipine (NORVASC) 2.5 MG tablet   metoprolol succinate (TOPROL-XL) 25 MG 24 hr tablet   Other Relevant Orders   EKG 12-Lead (Completed)   PSVT (paroxysmal supraventricular tachycardia) (HCC) - Primary (Chronic)    Short little bursts that she feels every now and then but has not had hardly any symptoms since starting low-dose Toprol.  Continue hydration Low threshold to discontinue amlodipine if pressures drop.      Relevant Medications   amLODipine (NORVASC) 2.5 MG tablet   metoprolol succinate (TOPROL-XL) 25 MG 24 hr tablet   Other Relevant Orders   EKG 12-Lead (Completed)     Other   Syncope and collapse (Chronic)    No further episodes of syncope.  Unlikely related to arrhythmia-episodes seem to be relatively sudden which would suggest bradycardia as opposed to tachycardia mediated.  There was no sign of bradycardia Carotid Dopplers, echocardiogram and monitor all been relatively unhelpful  With the thought that there could be some orthostatic component, would allow for mild permissive hypertension.  As such we have reduce amlodipine to 2.5 mg and started low-dose  Toprol 25 mg daily because of palpitations.  Continue aggressive hydration. Continue physical therapy for balance coordination. Allow time to stand and hold on before walking. Very careful with post micturition and bowel movements.       ===================================  HPI:    April Church is a 81 y.o. 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  April Church is the wife of a long-term patient of mine April Church) who passed away on April 26, 2020-after a long bout of renal failure following renal transplant that was complicated by ongoing cardiac issues.  He had multivessel CAD with CABG and essentially high risk PCI to an LAD diagonal that provided great relief.  April Church was the living related donor donating her kidney to April Church giving him additional 10 years. Almost as part of the detachment, she has started to see me as a cardiologist for primary risk assessment and management of PSVT/PAT and history of syncope (initially seen in August 2021).  April Church was last seen on December 26, 2020.  We discussed that her syncope was probably not related to her short little bursts of PAT noted on monitor.  These were 15 beat bursts, not likely long enough to lead to her falling and passing out.  She purchased an Visual merchandiser to monitor for arrhythmia.  The concern was that she probably had not been eating and drinking  well, and this could have been potentially exacerbated by primidone dosing. => We reviewed results of  an echocardiogram and lower extremity Dopplers. => Unfortunately she had suffered a couple more falls due to poor balance.  No symptoms to suggest arrhythmia or bradycardia, nothing seen on her watch.  There is concern for both orthostasis  contributing to her dizziness and lack of position sense. => We did discuss the importance of adequate hydration.  She said that she only lost consciousness once but had some several other episodes of dizziness without tachycardia.  The episode of loss of consciousness had nothing to do with dizziness or lightheadedness or tachycardia.  Recent Hospitalizations: None  Reviewed  CV studies:    The following studies were reviewed today: (if available, images/films reviewed: From Epic Chart or Care Everywhere) None:  Interval History:   April Church returns today overall doing better.  She is little more stable.  Has not had any further syncopal episodes and overall her balance is doing better with rehab.  She says that her palpitations have also significantly improved and her blood pressures are better.  The low-dose Toprol seems to be helping the palpitations and having reduced the amlodipine dose meaning that she did not get hypotensive. She seems to be in decent spirits.  She says she wants to stay alive as long as she can to spend time with her grandkids.  She misses April Church tremendously, but she will join him eventually but does not intend to do so anytime soon.  CV Review of Symptoms (Summary) Cardiovascular ROS: no chest pain or dyspnea on exertion positive for - -rare palpitations.  Still has some exertional intolerance and fatigue but otherwise stable.  Balance still not great but better.. negative for - edema, irregular heartbeat, orthopnea, paroxysmal nocturnal dyspnea, rapid heart rate, shortness of breath, or syncope or near syncope.  TIA Summers pickax, claudication  REVIEWED OF SYSTEMS   Review of Systems  Constitutional:  Negative for malaise/fatigue (Not really so much is is just a little bit of exercise tolerance from deconditioning.  Less sedentary but still not doing much) and weight loss (Maintaining stable weight.  Not losing any more).  HENT:  Negative for congestion  and nosebleeds.   Respiratory:  Negative for cough and shortness of breath.   Cardiovascular:        Per HPI  Gastrointestinal:  Negative for blood in stool and melena.  Genitourinary:  Negative for hematuria.  Musculoskeletal:  Positive for back pain and joint pain. Negative for falls (None since last visit).       Mild arthritis pains involving back and knees  Neurological:  Positive for dizziness (She still has some dizziness, but has not had any falls or passout spells.) and weakness (Generalized). Negative for seizures.  Psychiatric/Behavioral:  Negative for depression (Seems to be doing well psychologically following the death of her husband.  Just over 1 year out.) and memory loss. The patient is nervous/anxious (Pretty stable). The patient does not have insomnia.    I have reviewed and (if needed) personally updated the patient's problem list, medications, allergies, past medical and surgical history, social and family history.   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 2011- no further tx. now- [name] ,  [name]   Cyst of finger 11/2011   annular cyst right long finger   Dental crowns present    Dermatitis    Frequency of urination    GERD (gastroesophageal reflux disease)    Hemorrhoid    History of colon polyps 2009   Also noted 2012 and 2017 by colonoscopy.   History of shingles 1971   Had right ischial recurrence in the March 2019   Hyperlipidemia    On atorvastatin   Hypertension    under control, has been on med. x 2 yrs.   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 Leta Baptist), neurosurgeon Dr. Arnoldo Morale   PONV (postoperative nausea and vomiting)    Seasonal allergies    Shingles of eyelid  1971   Right eyelid; recurrent -> last episode 05/11/2017   Tremors of nervous system    hands-essential tremor; associated with Parkinson's.   Trigger finger of right hand 11/2011   long finger    PAST SURGICAL HISTORY   Past Surgical History:  Procedure Laterality Date   14-day Zio Patch Monitor  08/2020   (report to be scanned): Predominant SR w/ HR 61 to 142 bpm and average 86 bpm.  Total of 59 episodes of PAT/PSVT  4 to 15 beats (not noted on diary).  Fastest was 5 beats at a rate of 193 bpm, longest was 15 beats at a rate of 106 bpm.  Rare isolated PACs (as well as couplets and triplets) with rare isolated PVCs.  No sustained arrhythmias or bradycardia to explain syncope.   ANTERIOR CERVICAL DECOMPRESSION/DISCECTOMY FUSION 4 LEVELS N/A 11/14/2013   Procedure: ANTERIOR CERVICAL DECOMPRESSION/DISCECTOMY FUSION 4 LEVELS;  Surgeon: Newman Pies, MD;  Location: [facility];  Service: Neurosurgery;  Laterality: N/A;  C34 C45 C56 C67 anterior cervical fusion with interbody prosthesis plating and  bonegraft   APPENDECTOMY  age 76   BREAST LUMPECTOMY  06/16/2009   left; SLN bx.   BREAST LUMPECTOMY  07/01/2009   re-excision   BREAST SURGERY  02/22/1997   reduction   CATARACT EXTRACTION, BILATERAL     COLONOSCOPY WITH PROPOFOL N/A 12/03/2014   Procedure: COLONOSCOPY WITH PROPOFOL;  Surgeon: [name], MD;  Location: [facility];  Service: Endoscopy;  Laterality: N/A;   FOOT SURGERY  06/22/2011   left   KNEE ARTHROSCOPY  03/12/2005   right   KNEE ARTHROSCOPY     left   Lower Extremity Venous Dopplers  12/12/2020   No DVT bilaterally in the deep veins or superficial veins.  No deep or superficial venous reflux noted bilaterally with exception of Right SSV at the knee.   NASAL SINUS SURGERY     x 2   NEPHRECTOMY LIVING DONOR Left 04/22/2008   donated to spouse 2010([facility])   TRANSTHORACIC ECHOCARDIOGRAM  12/10/2020   EF 60 to 65%.  Normal LV size and function.  No heart  WMA.  GR 1 DD.  Normal RV, RVP and RAP.[name]  Normal valves.   TRIGGER FINGER RELEASE  12/16/2011   Procedure: RELEASE TRIGGER FINGER/A-1 PULLEY;  Surgeon: Tennis Must, MD;  Location: [facility];  Service: Orthopedics;  Laterality: Right;  RIGHT LONG FINGER TRIGGER RELEASE & ANNULAR CYST EXCISION   TUMOR EXCISION  age 74   right arm    Immunization History  Administered Date(s) Administered   Influenza,inj,Quad PF,6+ Mos 11/16/2013   Influenza-Unspecified 11/21/2019   PFIZER Comirnaty(Gray Top)Covid-19  Tri-Sucrose Vaccine 03/14/2019, 04/04/2019, 10/22/2019   Pneumococcal Conjugate-13 05/29/2013   Pneumococcal Polysaccharide-23 12/12/2015   Tdap 04/23/2013    MEDICATIONS/ALLERGIES   Current Meds  Medication Sig   albuterol (VENTOLIN HFA) 108 (90 Base) MCG/ACT inhaler Inhale 1-2 puffs into the lungs every 6 (six) hours as needed for wheezing or shortness of breath.   atorvastatin (LIPITOR) 20 MG tablet Take 20 mg by mouth every other day.    CALCIUM CARBONATE PO Take 1 tablet by mouth 2 (two) times daily.   carbidopa-levodopa (SINEMET IR) 25-100 MG tablet Take 2 tablets by mouth 3 (three) times daily.   Cholecalciferol (VITAMIN D) 1000 UNITS capsule Take 1,000 Units by mouth 2 (two) times daily.   Coenzyme Q10 (CO Q 10) 100 MG CAPS Take by mouth.   cyclobenzaprine (FLEXERIL) 5 MG tablet TAKE 1 TABLET BY MOUTH EVERY DAY AT BEDTIME AS NEEDED FOR  MUSCLE  SPASMS,  YOU  CAN  TAKE  UP  TO  2  TABLETS/'10MG'$   MAX  IF  NEEDED.   EPINEPHrine 0.3 mg/0.3 mL IJ SOAJ injection Inject 0.3 mg into the muscle as needed.   famotidine (PEPCID) 20 MG tablet Take 1 tablet (20 mg total) by mouth daily.   ipratropium (ATROVENT) 0.03 % nasal spray Place 2 sprays into both nostrils 2 (two) times daily.   ketotifen (ZADITOR) 0.025 % ophthalmic solution    levocetirizine (XYZAL) 5 MG tablet TAKE 1 TABLET BY MOUTH ONCE DAILY IN THE EVENING . APPOINTMENT REQUIRED FOR FUTURE REFILLS   Polyethyl  Glycol-Propyl Glycol (SYSTANE OP) Place 1 drop into both eyes 2 (two) times daily.   primidone (MYSOLINE) 50 MG tablet Take 2 tablets (100 mg total) by mouth 3 (three) times daily.   venlafaxine (EFFEXOR) 75 MG tablet Take 1 tablet (75 mg total) by mouth daily.   [DISCONTINUED] amLODipine (NORVASC) 2.5 MG tablet Take one tablet each day.   [DISCONTINUED] metoprolol succinate (TOPROL-XL) 25 MG 24 hr tablet Take 1 tablet (25 mg total) by mouth at bedtime.   [DISCONTINUED] SYMBICORT 160-4.5 MCG/ACT inhaler Inhale two puffs twice daily to prevent cough or wheeze. Rinse mouth after use.   [DISCONTINUED] triamcinolone cream (KENALOG) 0.1 % Apply 1 application topically 2 (two) times daily as needed (FOR RASH).    Allergies  Allergen Reactions   Shellfish Allergy Shortness Of Breath   Duloxetine Itching and Rash   Sulfa Drugs Cross Reactors Anxiety and Rash   Lisinopril     Other reaction(s): cough Other reaction(s): cough Other reaction(s): cough   Lyrica [Pregabalin] Other (See Comments)    "made me drunk, couldn't function"   Other     Other reaction(s): hyper   Sulfa Antibiotics Other (See Comments)    Other reaction(s): hyper, break out in a rash Other reaction(s): hyper, break out in a rash   Codeine Anxiety    Other reaction(s): hyper Other reaction(s): hyper    SOCIAL HISTORY/FAMILY HISTORY   Reviewed in Epic:  Pertinent findings:  Social History   Tobacco Use   Smoking status: Never   Smokeless tobacco: Never  Vaping Use   Vaping Use: Never used  Substance Use Topics   Alcohol use: No    Alcohol/week: 0.0 standard drinks of alcohol   Drug use: No   Social History   Social History Narrative   10/13/20    Recently widowed-lives alone, husband April Church) passed 04/26/2020    Patient has 1 daughter who lives in [place], [place]-> 1 grandson who lives  in [place]   Patient has a HS education.    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    Patient  is right handed.    Patient drinks caffeine occasionally.      OBJCTIVE -PE, EKG, labs   Wt Readings from Last 3 Encounters:  07/17/21 127 lb (57.6 kg)  07/14/21 126 lb 3.2 oz (57.2 kg)  04/10/21 124 lb 6.4 oz (56.4 kg)    Physical Exam: BP 134/80   Pulse 75   Ht 5' (1.524 m)   Wt 126 lb 3.2 oz (57.2 kg)   SpO2 98%   BMI 24.65 kg/m  Physical Exam Vitals reviewed.  Constitutional:      Appearance: Normal appearance. She is normal weight.     Comments: Somewhat frail elderly woman.  Slow wide-based gait with shuffling.  Has her baseline voice impediment with vibrato and hoarseness.  HENT:     Head: Normocephalic and atraumatic.  Neck:     Vascular: No carotid bruit or JVD.  Cardiovascular:     Rate and Rhythm: Normal rate and regular rhythm. No extrasystoles are present.    Chest Wall: PMI is not displaced.     Pulses: Normal pulses.     Heart sounds: Normal heart sounds, S1 normal and S2 normal. No murmur heard.    No friction rub. No gallop.  Pulmonary:     Effort: No respiratory distress.     Breath sounds: Normal breath sounds. No stridor (Hoarse dysphonia). No wheezing or rales.  Chest:     Chest wall: No tenderness.  Musculoskeletal:        General: No swelling. Normal range of motion.     Cervical back: Normal range of motion and neck supple.  Skin:    General: Skin is warm and dry.  Neurological:     General: No focal deficit present.     Mental Status: She is alert and oriented to person, place, and time. Mental status is at baseline.     Cranial Nerves: No cranial nerve deficit.     Gait: Gait abnormal (Wide-based, shuffling).     Comments: Baseline tremor  Psychiatric:        Mood and Affect: Mood normal.        Behavior: Behavior normal.        Thought Content: Thought content normal.        Judgment: Judgment normal.     Adult ECG Report  Rate: 75 ;  Rhythm: normal sinus rhythm and Low voltage.  Cannot rule out anterior Mikan age-indeterminate. ;    Narrative Interpretation: Stable  Recent Labs: Reviewed.  No recent labs. No results found for: "CHOL", "HDL", "[name]", "LDLDIRECT", "TRIG", "CHOLHDL" Lab Results  Component Value Date   CREATININE 0.65 07/30/2020   BUN 11 07/30/2020   NA 142 07/30/2020   K 4.5 07/30/2020   CL 100 07/30/2020   CO2 25 07/30/2020      Latest Ref Rng & Units 01/20/2015    1:26 PM 01/21/2014    1:41 PM 11/14/2013    1:09 PM  CBC  WBC 3.9 - 10.3 10e3/uL 5.2  5.8    Hemoglobin 11.6 - 15.9 g/dL 12.0  10.5  9.5   Hematocrit 34.8 - 46.6 % 36.1  32.7  28.0   Platelets 145 - 400 10e3/uL 293  384      No results found for: "HGBA1C" No results found for: "TSH"  ==================================================  COVID-19 Education:  The signs and symptoms of COVID-19 were discussed with the patient and how to seek care for testing (follow up with PCP or arrange E-visit).    I spent a total of 16 minutes with the patient spent in direct patient consultation.  Additional time spent with chart review  / charting (studies, outside notes, etc): 12 min Total Time: 28 min  Current medicines are reviewed at length with the patient today.  (+/- concerns) n/a  This visit occurred during the SARS-CoV-2 public health emergency.  Safety protocols were in place, including screening questions prior to the visit, additional usage of staff PPE, and extensive cleaning of exam room while observing appropriate contact time as indicated for disinfecting solutions.  Notice: This dictation was prepared with Dragon dictation along with smart phrase technology. Any transcriptional errors that result from this process are unintentional and may not be corrected upon review.  Studies Ordered:   Orders Placed This Encounter  Procedures   EKG 12-Lead   Meds ordered this encounter  Medications   amLODipine (NORVASC) 2.5 MG tablet    Sig: Take one tablet each day.    Dispense:  90 tablet    Refill:  3   metoprolol  succinate (TOPROL-XL) 25 MG 24 hr tablet    Sig: Take 1 tablet (25 mg total) by mouth at bedtime.    Dispense:  90 tablet    Refill:  3    Patient Instructions / Medication Changes & Studies & Tests Ordered   Patient Instructions  Medication Instructions:  No changes  *If you need a refill on your cardiac medications before your next appointment, please call your pharmacy*   Lab Work: Not needed    Testing/Procedures:  Not needed  Follow-Up: At [facility], you and your health needs are our priority.  As part of our continuing mission to provide you with exceptional heart care, we have created designated Provider Care Teams.  These Care Teams include your primary Cardiologist (physician) and Advanced Practice Providers (APPs -  Physician Assistants and Nurse Practitioners) who all work together to provide you with the care you need, when you need it.     Your next appointment:   12 month(s)  The format for your next appointment:   In Person  Provider:   Glenetta Hew, MD         April Church, M.D., M.S. Interventional Cardiologist   Pager # [phone] Phone # [phone] [address]. [place], [place] [address]   Thank you for choosing [facility]!!

## 2021-07-14 NOTE — Therapy (Signed)
[facility] [address] [place], [place], [address] Phone: [phone]   Fax:  [phone]  Patient Details  Name: April Church MRN: [id number] Date of Birth: Jul 20, 1940 Referring Provider:  Leeroy Cha,*  Encounter Date: 07/14/2021  Physical Therapy Parkinson's Disease Screen   Timed Up and Go test:11.91 (previously 13 seconds)  10 meter walk test:12.15 seconds = 2.7 ft/sec (previously 2.7 ft/sec)  5 time sit to stand test: 19.53 seconds (previously 15.88 seconds)   Joined the [employer] and did some pool classes and machines and reports incr pain in her calf.  Only went to 2 classes and stopped. Has been doing some walking outdoors.   Feels like her balance has been a little bit more off. No falls.   Encouraged pt to continue with HEP from previous bout of therapy.   Patient does not require Physical Therapy services at this time.  Recommend Physical Therapy screen in 6 months. Pt also prefers to have screen in 6 months.    Arliss Journey, PT, DPT 07/14/2021, 1:29 PM  [facility] [address] [place], [place], [address] Phone: [phone]   Fax:  [phone]

## 2021-07-14 NOTE — Patient Instructions (Signed)

## 2021-07-14 NOTE — Therapy (Signed)
[facility] [address] [place], [place], [address] Phone: [phone]   Fax:  [phone]  Patient Details  Name: April Church MRN: [id number] Date of Birth: [birth year] Referring Provider:  Curt Bears., MD  Encounter Date: 07/14/2021  Speech Therapy Parkinson's Disease Screen   Decibel Level today: low 70s dB  (WNL=70-72 dB) with sound level meter 30cm away from pt's mouth. Pt's conversational volume has remained unchanged since last ST screener. Pt presents with voice tremor, in which pt most recently received vocal cord injection in August 2021. Pt reportedly has ENT appointment on June 9th for vocal cord injection.  Pt may require ST services following injection per ENT recommendation.    Pt does not report further difficulty with swallowing at this time, as pt is using small sips for thin liquids per recommendation at last ST screening. Objective swallow study was recommended previously, which pt did not pursue at that time. SLP recommended pt contact ENT or PCP if swallow function worsens or increased coughing noted.    Pt may require speech therapy - to be determined after ENT visit at [facility] for consult about botox injection for vocal tremor.    Recommend ST screen in another 6-8 months if pt elects not to obtain objective swallow study or consult ENT.    Marzetta Board, [place] 07/14/2021, 1:07 PM  [facility] [address] [place], [place], [address] Phone: [phone]   Fax:  [phone]

## 2021-07-17 ENCOUNTER — Encounter: Payer: Self-pay | Admitting: Physical Medicine and Rehabilitation

## 2021-07-17 ENCOUNTER — Encounter
Payer: Medicare Other | Attending: Physical Medicine and Rehabilitation | Admitting: Physical Medicine and Rehabilitation

## 2021-07-17 VITALS — BP 118/79 | HR 72 | Temp 97.9°F | Ht 60.0 in | Wt 127.0 lb

## 2021-07-17 DIAGNOSIS — M1712 Unilateral primary osteoarthritis, left knee: Secondary | ICD-10-CM | POA: Diagnosis not present

## 2021-07-17 DIAGNOSIS — M170 Bilateral primary osteoarthritis of knee: Secondary | ICD-10-CM | POA: Diagnosis not present

## 2021-07-17 DIAGNOSIS — M1711 Unilateral primary osteoarthritis, right knee: Secondary | ICD-10-CM | POA: Insufficient documentation

## 2021-07-17 NOTE — Patient Instructions (Signed)
Plan: B/L knee injections-  - steroid injection was performed at B/L knee steroid injections  using 1% plain Lidocaine and 1cc of Celestone in each knee This was well tolerated.  Cleaned with betadine x3 and allowed to dry- then alcohol then injected using 27 gauge 1.5 inch needle- no bleeding or complications.  F/U in 3 months for steroid injections of B/L knees - Celestone-  Lidocaine will kick in 15 minutes- and wear off tonight- the steroid will kick in tomorrow within 24 hours and take up to 72 hours to fully kick in.   2. F/U in 4 months since did so well with these past

## 2021-07-17 NOTE — Progress Notes (Signed)
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 09/18/19 Back injection- R L4-5 transforminal.  epidural steroid injection.  Here for f/u for chronic pain and B/L knee injections.      Pt is here for B/L knee injections. This time, injections lasted longer- lasted until this past week! Started to cancel today, but that time, will be hurting.   If back hurting, (usually hurts on L low back now, not R)  takes 2 extra strength tylenol and sits down and that helps most of the time.   So wants to stick to that, before trying anything different.   Did reduce the weight on her machines- Legs feel a lot better, if doesn't do weights.  After does weights, had calf pain, so quit.  Walking now- 15-20 minutes- every day, late afternoon  Didn't renew membership at [facility].      Plan: B/L knee injections-  - steroid injection was performed at B/L knee steroid injections  using 1% plain Lidocaine and 1cc of Celestone in each knee This was well tolerated.  Cleaned with betadine x3 and allowed to dry- then alcohol then injected using 27 gauge 1.5 inch needle- no bleeding or complications.  F/U in 3 months for steroid injections of B/L knees - Celestone-  Lidocaine will kick in 15 minutes- and wear off tonight- the steroid will kick in tomorrow within 24 hours and take up to 72 hours to fully kick in.   2. F/U in 4 months since did so well with these past injections.

## 2021-07-31 DIAGNOSIS — G249 Dystonia, unspecified: Secondary | ICD-10-CM | POA: Diagnosis not present

## 2021-07-31 DIAGNOSIS — J385 Laryngeal spasm: Secondary | ICD-10-CM | POA: Diagnosis not present

## 2021-08-12 DIAGNOSIS — Z23 Encounter for immunization: Secondary | ICD-10-CM | POA: Diagnosis not present

## 2021-08-24 ENCOUNTER — Encounter: Payer: Self-pay | Admitting: Cardiology

## 2021-08-24 NOTE — Assessment & Plan Note (Signed)
Given her advanced age, and musculoskeletal issues as well as borderline memory issues, would not titrate statin any further.  If LDL goes wrong direction, would probably switch to rosuvastatin but would not increase atorvastatin dose.

## 2021-08-24 NOTE — Assessment & Plan Note (Signed)
Short little bursts that she feels every now and then but has not had hardly any symptoms since starting low-dose Toprol.  Continue hydration Low threshold to discontinue amlodipine if pressures drop.

## 2021-08-24 NOTE — Assessment & Plan Note (Signed)
No further episodes of syncope.  Unlikely related to arrhythmia-episodes seem to be relatively sudden which would suggest bradycardia as opposed to tachycardia mediated.  There was no sign of bradycardia Carotid Dopplers, echocardiogram and monitor all been relatively unhelpful  With the thought that there could be some orthostatic component, would allow for mild permissive hypertension.  As such we have reduce amlodipine to 2.5 mg and started low-dose Toprol 25 mg daily because of palpitations.  Continue aggressive hydration. Continue physical therapy for balance coordination. Allow time to stand and hold on before walking. Very careful with post micturition and bowel movements.

## 2021-09-07 ENCOUNTER — Other Ambulatory Visit: Payer: Self-pay | Admitting: Physical Medicine and Rehabilitation

## 2021-09-17 ENCOUNTER — Other Ambulatory Visit: Payer: Self-pay | Admitting: Physical Medicine and Rehabilitation

## 2021-09-22 IMAGING — MR MR PELVIS WO/W CM
18 series · 48 of 48 positions shown · IV contrast (11ml multihance)
Comparison: None.

CLINICAL DATA: Uterine fibroids.

EXAM:
MRI PELVIS WITHOUT AND WITH CONTRAST
TECHNIQUE: Multiplanar multisequence MR imaging of the pelvis was performed
both before and after administration of intravenous contrast.
CONTRAST:  11mL MULTIHANCE GADOBENATE DIMEGLUMINE 529 MG/ML IV SOLN

[Series 3: cor haste · coronal · 6.0mm · 0.74mm/px · 1 of 24 slices shown]
[im 1/24]
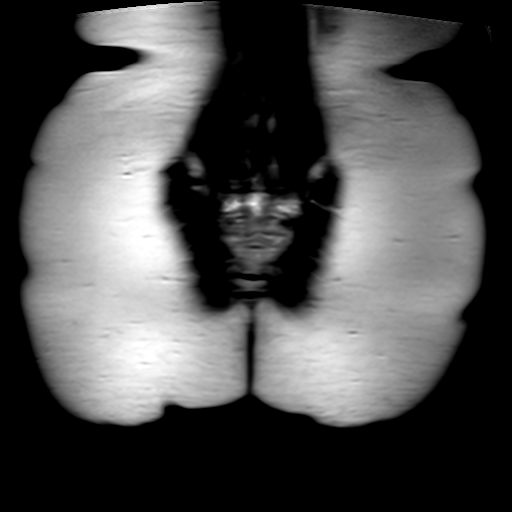

[Series 4: T2 · axial · 5.0mm · 0.94mm/px · 1 of 30 slices shown]
[im 1/30]
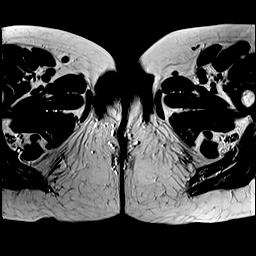

[Series 5: t2_tse axial fs · axial · 5.0mm · 0.94mm/px · 1 of 30 slices shown]
[im 1/30]
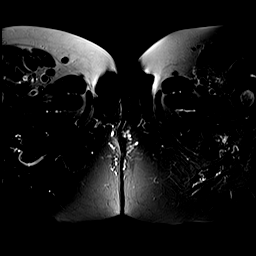

[Series 6: t2_tse_sag · sagittal · 5.0mm · 0.94mm/px · 1 of 25 slices shown (1 of 2)]
[im 1/25]
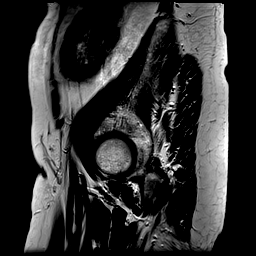

[Series 7: T1 · axial · 5.0mm · 0.55mm/px · z∈[-113,+85]mm · 2 of 74 slices shown]
[im 1/74]
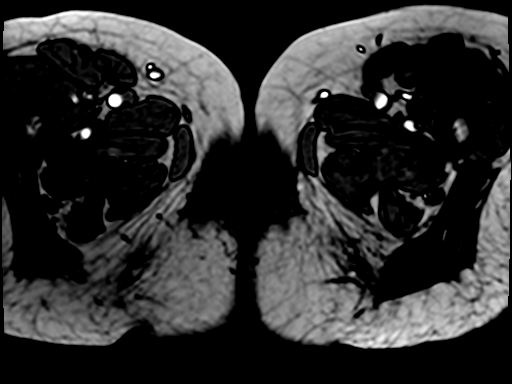
[im 74/74]
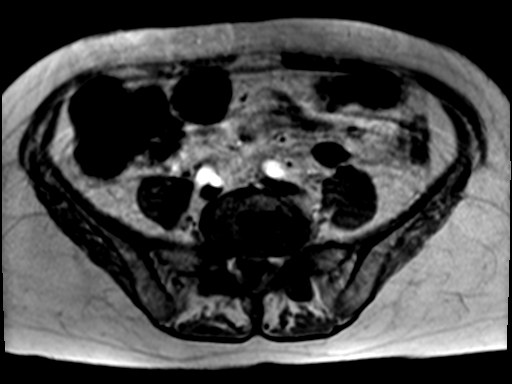

[Series 8: DWI · axial · 6.0mm · 1.82mm/px · z∈[-126,+97]mm · 3 of 96 slices shown]
[im 1/96]
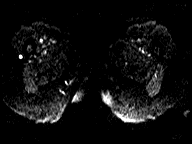
[im 48/96]
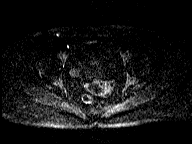
[im 96/96]
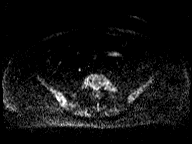

[Series 9: axial dwi_adc · axial · 6.0mm · 1.82mm/px · 1 of 32 slices shown]
[im 1/32]
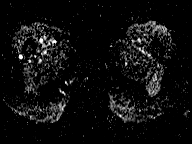

[Series 10: T1 dynamic · axial · non-contrast · 2.3mm · 1.37mm/px · z∈[-114,+86]mm · 3 of 88 slices shown]
[im 1/88]
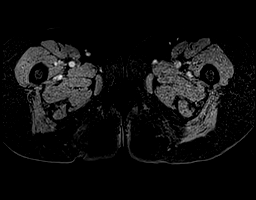
[im 44/88]
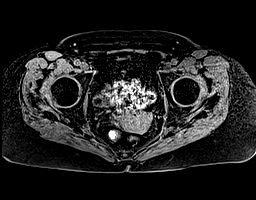
[im 88/88]
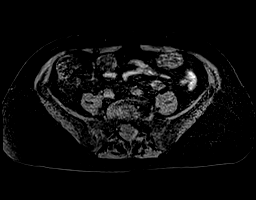

[Series 11: ax post dynamic · axial · 2.3mm · 1.37mm/px · z∈[-114,+86]mm · 4 of 88 slices shown (1 of 2)]
[im 1/88]
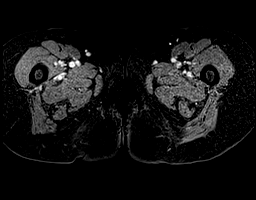
[im 30/88]
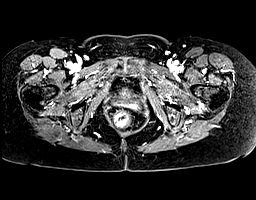
[im 59/88]
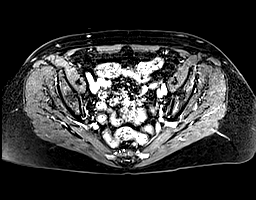
[im 88/88]
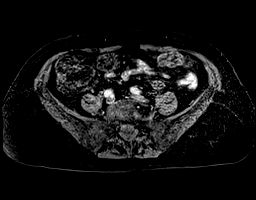

[Series 12: ax post dynamic · axial · 2.3mm · 1.37mm/px · z∈[-114,+86]mm · 4 of 88 slices shown (2 of 2)]
[im 1/88]
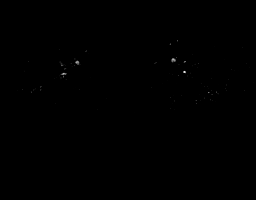
[im 30/88]
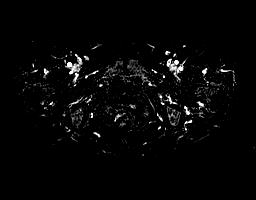
[im 59/88]
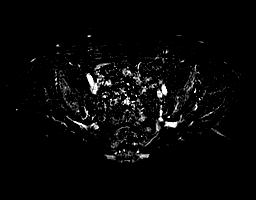
[im 88/88]
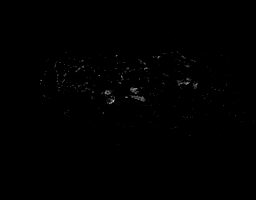

[Series 13: ax post 45 · axial · 2.3mm · 1.37mm/px · z∈[-114,+86]mm · 4 of 88 slices shown (1 of 2)]
[im 1/88]
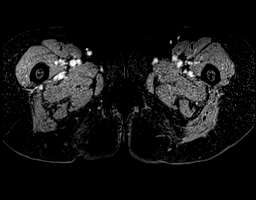
[im 30/88]
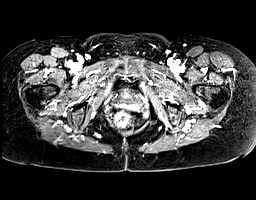
[im 59/88]
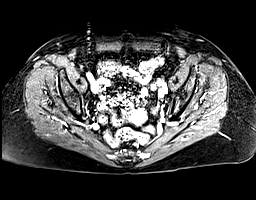
[im 88/88]
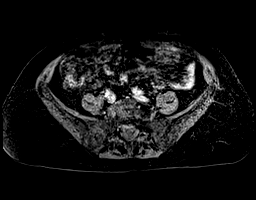

[Series 14: ax post 45 · axial · 2.3mm · 1.37mm/px · z∈[-114,+86]mm · 4 of 88 slices shown (2 of 2)]
[im 1/88]
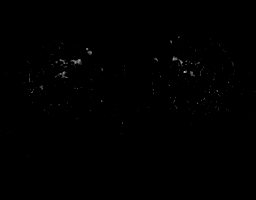
[im 30/88]
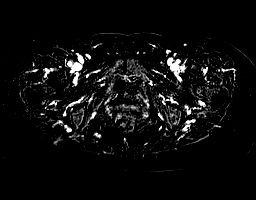
[im 59/88]
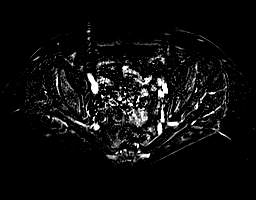
[im 88/88]
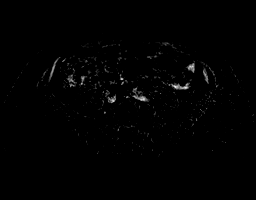

[Series 15: ax post 90 · axial · 2.3mm · 1.37mm/px · z∈[-114,+86]mm · 4 of 88 slices shown (1 of 2)]
[im 1/88]
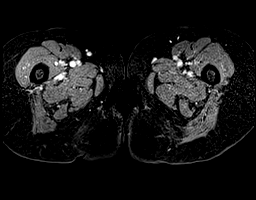
[im 30/88]
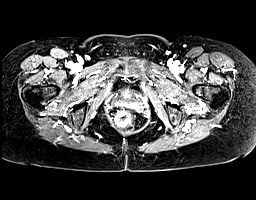
[im 59/88]
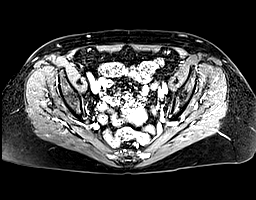
[im 88/88]
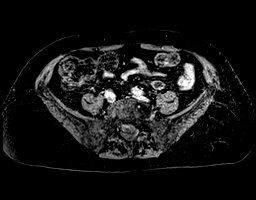

[Series 16: ax post 90 · axial · 2.3mm · 1.37mm/px · z∈[-114,+86]mm · 4 of 88 slices shown (2 of 2)]
[im 1/88]
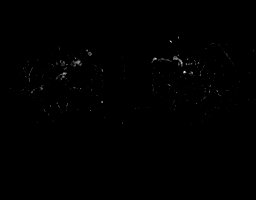
[im 30/88]
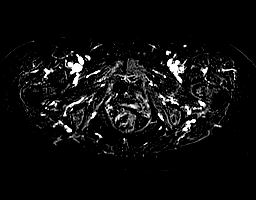
[im 59/88]
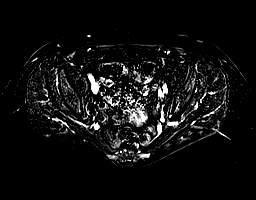
[im 88/88]
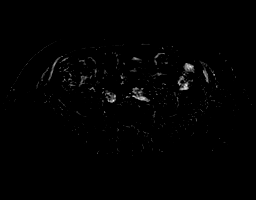

[Series 17: T1 dynamic post-contrast · sagittal · 2.5mm · 0.68mm/px · 2 of 60 slices shown]
[im 1/60]
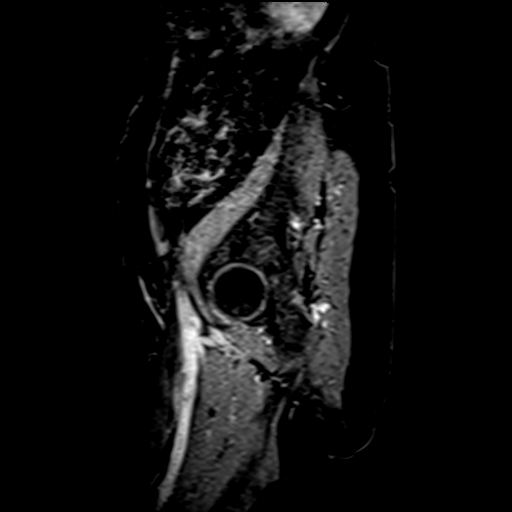
[im 60/60]
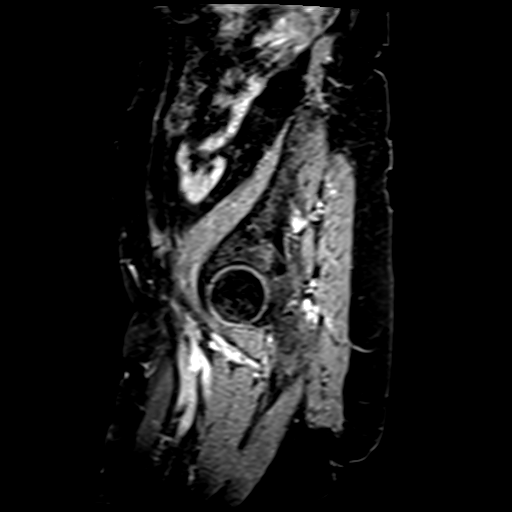

[Series 18: ax post 3+ · axial · 2.3mm · 1.37mm/px · z∈[-114,+86]mm · 4 of 88 slices shown (1 of 2)]
[im 1/88]
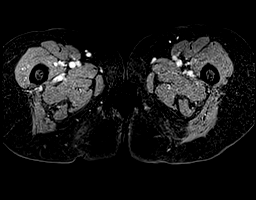
[im 30/88]
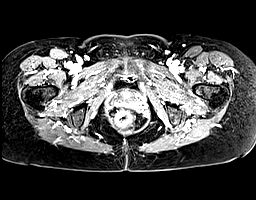
[im 59/88]
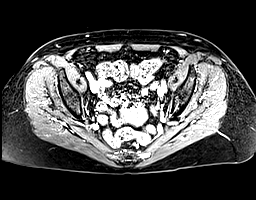
[im 88/88]
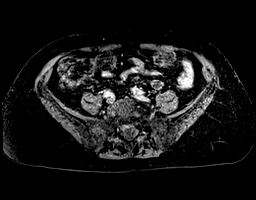

[Series 19: ax post 3+ · axial · 2.3mm · 1.37mm/px · z∈[-114,+86]mm · 4 of 88 slices shown (2 of 2)]
[im 1/88]
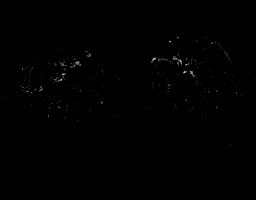
[im 30/88]
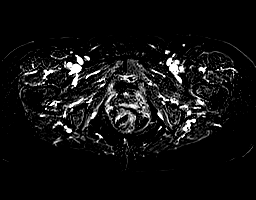
[im 59/88]
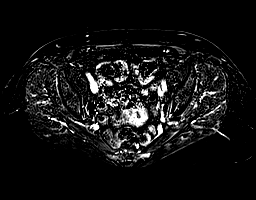
[im 88/88]
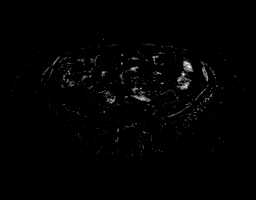

[Series 20: t2_tse_sag · sagittal · 5.0mm · 0.94mm/px · 1 of 25 slices shown (2 of 2)]
[im 1/25]
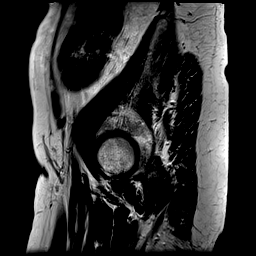

[48 of 48 positions shown; findings below may reference images not displayed]

FINDINGS: Lower Urinary Tract: No urinary bladder or urethral abnormality
identified.

Bowel: Unremarkable pelvic bowel loops.

Vascular/Lymphatic: Unremarkable. No pathologically enlarged pelvic
lymph nodes identified.

Reproductive:

-- Uterus: Measures 6.3 x 3.5 by 4.7 cm (volume = 54 cm^3).
Numerous small fibroids are seen involving the uterus diffusely,
largest in the right lateral corpus measuring 2.2 cm. No evidence of
a pedunculated or intracavitary fibroids. No evidence of abnormal
endometrial thickening. Cervix and vagina are unremarkable in
appearance.

-- Right ovary: Not visualized, however no adnexal mass identified.

-- Left ovary:  Not visualized, however no adnexal mass identified.

Other: No peritoneal thickening or abnormal free fluid.

Musculoskeletal: Diffuse edema and enhancement is seen throughout
the right sacral ala, consistent with sacral insufficiency fracture.
IMPRESSION: Multiple small uterine fibroids, largest measuring 2.2 cm. No
evidence of pedunculated or intracavitary fibroids.

Nonvisualization of ovaries, however no adnexal mass identified.

Right-sided sacral insufficiency fracture.

## 2021-09-25 ENCOUNTER — Encounter: Payer: Self-pay | Admitting: Allergy

## 2021-09-25 ENCOUNTER — Ambulatory Visit (INDEPENDENT_AMBULATORY_CARE_PROVIDER_SITE_OTHER): Payer: Medicare Other | Admitting: Allergy

## 2021-09-25 VITALS — BP 110/64 | HR 116 | Temp 98.4°F | Resp 20 | Ht 60.0 in | Wt 126.0 lb

## 2021-09-25 DIAGNOSIS — J310 Chronic rhinitis: Secondary | ICD-10-CM

## 2021-09-25 DIAGNOSIS — J3802 Paralysis of vocal cords and larynx, bilateral: Secondary | ICD-10-CM

## 2021-09-25 DIAGNOSIS — J4540 Moderate persistent asthma, uncomplicated: Secondary | ICD-10-CM | POA: Diagnosis not present

## 2021-09-25 DIAGNOSIS — T781XXD Other adverse food reactions, not elsewhere classified, subsequent encounter: Secondary | ICD-10-CM

## 2021-09-25 DIAGNOSIS — K219 Gastro-esophageal reflux disease without esophagitis: Secondary | ICD-10-CM | POA: Diagnosis not present

## 2021-09-25 MED ORDER — EPINEPHRINE 0.3 MG/0.3ML IJ SOAJ: 0.3000 mg | INTRAMUSCULAR | 1 refills | Status: AC | PRN

## 2021-09-25 NOTE — Progress Notes (Signed)
Follow-up Note  RE: April Church MRN: [id number] DOB: 11-25-1940 Date of Office Visit: 09/25/2021   History of present illness: April Church is a 81 y.o. female presenting today for follow-up of asthma, nonallergic rhinitis, vocal fold paralysis, GERD and food allergy.  She was last seen in the office on 03/27/2021 by nurse practitioner Ambs.  She states she has had a decent spring and summer months far.  She denies any major health changes, surgeries or hospitalizations. She did get back in with her ENT Dr Joya Gaskins about a month ago for a Botox injection into her vocal folds.  She states this was her first injection in the past 2 years as she just forgot about needing appointments to get back into have this therapy.  In that time she states her husband passed and this was less of a priority.  She now states she has a 52-monthfollow-up for further treatment. With her asthma she states she has been doing okay.  She states albuterol use is about once a week on average for typically wheezing symptoms.  She continues to use Symbicort 160 mcg 2 puffs twice a day.  She denies nighttime awakenings.  She denies need for urgent care or ED visits or any systemic steroid needs since the last visit. She takes Xyzal nightly and states this really helps with her rhinitis symptoms.  She also takes Atrovent spray in the morning and at night for her rhinitis as well.  The combination of these 2 medications have been helpful for her. She does take famotidine typically once a day to help with reflux and she does note more reflux symptoms if she forgets to take it. She continues to avoid shellfish and wine.  She has access to her epinephrine device that she needs a refill of at this time.  Review of systems in the past 4 weeks: Review of Systems  Constitutional: Negative.   HENT: Negative.    Eyes: Negative.   Respiratory:  Positive for wheezing.   Cardiovascular: Negative.   Gastrointestinal:  Negative.   Musculoskeletal: Negative.   Skin: Negative.   Allergic/Immunologic: Negative.   Neurological: Negative.      All other systems negative unless noted above in HPI  Past medical/social/surgical/family history have been reviewed and are unchanged unless specifically indicated below.  No changes  Medication List: Current Outpatient Medications  Medication Sig Dispense Refill   albuterol (VENTOLIN HFA) 108 (90 Base) MCG/ACT inhaler Inhale 1-2 puffs into the lungs every 6 (six) hours as needed for wheezing or shortness of breath. 18 g 1   amLODipine (NORVASC) 2.5 MG tablet Take one tablet each day. 90 tablet 3   atorvastatin (LIPITOR) 20 MG tablet Take 20 mg by mouth every other day.      budesonide-formoterol (SYMBICORT) 160-4.5 MCG/ACT inhaler 2 puffs     CALCIUM CARBONATE PO Take 1 tablet by mouth 2 (two) times daily.     carbidopa-levodopa (SINEMET IR) 25-100 MG tablet Take 2 tablets by mouth 3 (three) times daily. 540 tablet 4   Cholecalciferol (VITAMIN D) 1000 UNITS capsule Take 1,000 Units by mouth 2 (two) times daily.     Coenzyme Q10 (CO Q 10) 100 MG CAPS Take by mouth.     cyclobenzaprine (FLEXERIL) 5 MG tablet TAKE 1 TO UP TO 2 TABLETS BY MOUTH ONCE DAILY AT BEDTIME AS NEEDED FOR MUSCLE SPASM 50 tablet 11   famotidine (PEPCID) 20 MG tablet Take 1 tablet (20 mg total) by  mouth daily. 30 tablet 5   fluticasone (FLOVENT HFA) 110 MCG/ACT inhaler 1 puff     hydrocortisone (ANUSOL-HC) 2.5 % rectal cream Procto-Med HC 2.5 % topical cream perineal applicator  APPLY CREAM RECTALLY TO AFFECTED AREA TWICE DAILY     ipratropium (ATROVENT) 0.03 % nasal spray Place 2 sprays into both nostrils 2 (two) times daily. 30 mL 5   ketotifen (ZADITOR) 0.025 % ophthalmic solution      levocetirizine (XYZAL) 5 MG tablet TAKE 1 TABLET BY MOUTH ONCE DAILY IN THE EVENING . APPOINTMENT REQUIRED FOR FUTURE REFILLS 30 tablet 5   metoprolol succinate (TOPROL-XL) 25 MG 24 hr tablet Take 1 tablet (25  mg total) by mouth at bedtime. 90 tablet 3   nystatin ointment (MYCOSTATIN) nystatin 100,000 unit/gram topical ointment     Polyethyl Glycol-Propyl Glycol (SYSTANE OP) Place 1 drop into both eyes 2 (two) times daily.     primidone (MYSOLINE) 50 MG tablet Take 2 tablets (100 mg total) by mouth 3 (three) times daily. 540 tablet 4   traMADol (ULTRAM) 50 MG tablet TAKE 1 TABLET BY MOUTH EVERY 12 HOURS AS NEEDED 60 tablet 5   triamcinolone ointment (KENALOG) 0.1 % triamcinolone acetonide 0.1 % topical ointment     venlafaxine (EFFEXOR) 75 MG tablet Take 1 tablet (75 mg total) by mouth daily. 90 tablet 1   EPINEPHrine 0.3 mg/0.3 mL IJ SOAJ injection Inject 0.3 mg into the muscle as needed. 2 each 1   No current facility-administered medications for this visit.     Known medication allergies: Allergies  Allergen Reactions   Shellfish Allergy Shortness Of Breath   Duloxetine Itching and Rash   Sulfa Drugs Cross Reactors Anxiety and Rash   Lisinopril     Other reaction(s): cough Other reaction(s): cough Other reaction(s): cough   Lyrica [Pregabalin] Other (See Comments)    "made me drunk, couldn't function"   Other     Other reaction(s): hyper   Sulfa Antibiotics Other (See Comments)    Other reaction(s): hyper, break out in a rash Other reaction(s): hyper, break out in a rash   Codeine Anxiety    Other reaction(s): hyper Other reaction(s): hyper     Physical examination: Blood pressure 110/64, pulse (!) 116, temperature 98.4 F (36.9 C), resp. rate 20, height 5' (1.524 m), weight 126 lb (57.2 kg), SpO2 95 %.  General: Alert, interactive, in no acute distress, generalized tremor. HEENT: PERRLA, TMs pearly gray, turbinates non-edematous without discharge, post-pharynx non erythematous. Neck: Supple without lymphadenopathy. Lungs: Clear to auscultation without wheezing, rhonchi or rales. {no increased work of breathing. CV: Normal S1, S2 without murmurs. Abdomen: Nondistended,  nontender. Skin: Warm and dry, without lesions or rashes. Extremities:  No clubbing, cyanosis or edema. Neuro:   Grossly intact.  Diagnositics/Labs: Spirometry: FEV1: 0.82 L 50%, FVC: 1.09 L 50% predicted.  This does represent a restrictive pattern.  She does have vocal fold paralysis.  This is quite stable for her.  Assessment and plan:   Asthma Remains under good control Continue Symbicort 160/4.5 mcg  2 puffs twice a day with spacer to help prevent cough and wheeze.  Continue albuterol 2 puff into the lungs every 4-6 hours as needed for shortness of breath or wheezing Have access to albuterol inhaler 2 puffs every 4-6 hours as needed for cough/wheeze/shortness of breath/chest tightness.  May use 15-20 minutes prior to activity.   Monitor frequency of use.    Chronic nonallergic rhinitis Continue Xyzal 5 mg once  a day as needed for a runny nose.  Continue Atrovent 0.3% use 2 sprays in each nostril twice a day as needed for a runny nose Consider saline nasal rinses as needed for nasal symptoms. Use this before any medicated nasal sprays for best result  Vocal fold paralysis, bilateral Continue routine follow-up with Dr. Joya Gaskins with ENT for periodic Botox injections as directed.   GERD Continue famotidine 20 mg once a day as you have been Continue dietary and lifestyle modifications as listed below  Food allergy Continue avoidance of shellfish and wine  In case of an allergic reaction, take Benadryl 50 mg every 4 hours, and if life-threatening symptoms occur, inject with EpiPen 0.3 mg.  Follow up in 4-6 months or sooner if needed   I appreciate the opportunity to take part in Kimisha's care. Please do not hesitate to contact me with questions.  Sincerely,   Prudy Feeler, MD Allergy/Immunology [facility]

## 2021-09-25 NOTE — Patient Instructions (Signed)
Asthma Continue Symbicort 160/4.5 mcg  2 puffs twice a day with spacer to help prevent cough and wheeze.  Continue albuterol 2 puff into the lungs every 4-6 hours as needed for shortness of breath or wheezing Have access to albuterol inhaler 2 puffs every 4-6 hours as needed for cough/wheeze/shortness of breath/chest tightness.  May use 15-20 minutes prior to activity.   Monitor frequency of use.    Chronic nonallergic rhinitis Continue Xyzal 5 mg once a day as needed for a runny nose.  Continue Atrovent 0.3% use 2 sprays in each nostril twice a day as needed for a runny nose Consider saline nasal rinses as needed for nasal symptoms. Use this before any medicated nasal sprays for best result  Vocal fold paralysis, bilateral Continue routine follow-up with Dr. Joya Gaskins with ENT for periodic Botox injections as directed.   GERD Continue famotidine 20 mg once a day as you have been Continue dietary and lifestyle modifications as listed below  Food allergy Continue avoidance of shellfish and wine  In case of an allergic reaction, take Benadryl 50 mg every 4 hours, and if life-threatening symptoms occur, inject with EpiPen 0.3 mg.  Follow up in 4-6 months or sooner if needed

## 2021-09-28 ENCOUNTER — Other Ambulatory Visit: Payer: Self-pay | Admitting: Family

## 2021-09-28 DIAGNOSIS — G20 Parkinson's disease: Secondary | ICD-10-CM | POA: Diagnosis not present

## 2021-09-28 DIAGNOSIS — F3341 Major depressive disorder, recurrent, in partial remission: Secondary | ICD-10-CM | POA: Diagnosis not present

## 2021-09-28 DIAGNOSIS — J45909 Unspecified asthma, uncomplicated: Secondary | ICD-10-CM | POA: Diagnosis not present

## 2021-09-28 DIAGNOSIS — E785 Hyperlipidemia, unspecified: Secondary | ICD-10-CM | POA: Diagnosis not present

## 2021-09-28 DIAGNOSIS — I10 Essential (primary) hypertension: Secondary | ICD-10-CM | POA: Diagnosis not present

## 2021-09-28 NOTE — Telephone Encounter (Signed)
Sending to Dr. Nelva Bush since you most recently saw [facility]

## 2021-09-30 DIAGNOSIS — Z6825 Body mass index (BMI) 25.0-25.9, adult: Secondary | ICD-10-CM | POA: Diagnosis not present

## 2021-09-30 DIAGNOSIS — Z01411 Encounter for gynecological examination (general) (routine) with abnormal findings: Secondary | ICD-10-CM | POA: Diagnosis not present

## 2021-09-30 DIAGNOSIS — Z124 Encounter for screening for malignant neoplasm of cervix: Secondary | ICD-10-CM | POA: Diagnosis not present

## 2021-09-30 DIAGNOSIS — Z0142 Encounter for cervical smear to confirm findings of recent normal smear following initial abnormal smear: Secondary | ICD-10-CM | POA: Diagnosis not present

## 2021-09-30 DIAGNOSIS — K625 Hemorrhage of anus and rectum: Secondary | ICD-10-CM | POA: Diagnosis not present

## 2021-09-30 DIAGNOSIS — Z01419 Encounter for gynecological examination (general) (routine) without abnormal findings: Secondary | ICD-10-CM | POA: Diagnosis not present

## 2021-10-13 ENCOUNTER — Encounter: Payer: Self-pay | Admitting: Diagnostic Neuroimaging

## 2021-10-13 ENCOUNTER — Ambulatory Visit (INDEPENDENT_AMBULATORY_CARE_PROVIDER_SITE_OTHER): Payer: Medicare Other | Admitting: Diagnostic Neuroimaging

## 2021-10-13 VITALS — BP 155/99 | HR 86 | Ht 60.0 in | Wt 129.0 lb

## 2021-10-13 DIAGNOSIS — G20 Parkinson's disease: Secondary | ICD-10-CM

## 2021-10-13 DIAGNOSIS — M7989 Other specified soft tissue disorders: Secondary | ICD-10-CM

## 2021-10-13 DIAGNOSIS — R269 Unspecified abnormalities of gait and mobility: Secondary | ICD-10-CM

## 2021-10-13 MED ORDER — CARBIDOPA-LEVODOPA 25-100 MG PO TABS: 2.0000 | ORAL_TABLET | Freq: Three times a day (TID) | ORAL | 4 refills | Status: DC

## 2021-10-13 MED ORDER — PRIMIDONE 50 MG PO TABS
100.0000 mg | ORAL_TABLET | Freq: Three times a day (TID) | ORAL | 4 refills | Status: DC
Start: 2021-10-13 — End: 2022-11-17

## 2021-10-13 MED ORDER — ENTACAPONE 200 MG PO TABS: 200.0000 mg | ORAL_TABLET | Freq: Three times a day (TID) | ORAL | 12 refills | Status: DC

## 2021-10-13 NOTE — Progress Notes (Signed)
[facility]  PATIENT: April Church DOB: 1941/01/04  REFERRING CLINICIAN:  HISTORY FROM: patient REASON FOR VISIT: follow up    HISTORICAL  CHIEF COMPLAINT:  Chief Complaint  Patient presents with   Follow-up    RM 6, alone. C/o difficulty getting around. C/o swelling in L leg/foot that started 2 days ago.    HISTORY OF PRESENT ILLNESS:   UPDATE (10/13/21, VRP): Since last visit, doing about the same. New onset left leg swelling x 2 days. No triggers. Tolerating carb/levo, but wearing off after 1-2 hours. Still with knee pain and back pain issues. Gait diff continues.   UPDATE (12/23/20, VRP): Since last visit, still having leg pain (calves, aching pain). Now back in water exercises. Had BLE u/s, neg for DVT. Completed PT. PD stable.   UPDATE (10/13/20, VRP): Since last visit, tremor stable. Had 1 major fall 1 month ago; patient not sure about LOC. Getting heart workup. Increased primidone not helping. Seeing pain mgmt for back issues and leg pain issues.  UPDATE (10/10/19, VRP): Since last visit, tremor slightly progressing. Symptoms are moderate. Severity is moderate. No alleviating or aggravating factors. Tolerating medications.  Lumbar spine injections helping with low back pain.  UPDATE (01/09/19, VRP): Since last visit, doing well. Symptoms are stable. Severity is moderate. No alleviating or aggravating factors. Tolerating carb/levo 2 tabs three times a day. Planning for pain mgmt injections soon for low back pain.     UPDATE (01/31/18, VRP): Since last visit, doing about the same, except slightly more cramps and pain in arms and back. No alleviating or aggravating factors. Tolerating carb/levo.    UPDATE (05/23/17, VRP): Since last visit, doing well. Tolerating meds. No alleviating or aggravating factors. Tremor slightly worse. Some memory issues. Taking care of own ADLs. No major changes.  UPDATE 11/15/16: Since last visit, overall doing well.  Tremor stable. Some memory loss issues continue, but not affecting ADLs. Tolerating meds. No falls. 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   UPDATE 05/10/16: Since last visit, stable. Some mild tremors in left hand. Mild balance issues. Mild memory issues. Cyclobenz helps with night time spasms.   UPDATE 11/12/15: Since last visit, tremors are stable. Balance stable. Now with night time neck, shoulder, spine and leg pain (spasm). Had PT and OT evals as well.   UPDATE 05/13/15: Since last visit, tremor in arms better. Had ST and botox for voice issues. Some wearing off of meds before mid-day dosing. No neck pain issues.   UPDATE 02/12/15: Since last visit, tremor is stable. More voice strangulation issues. Tolerating primidone '100mg'$  TID.  UPDATE 08/13/14: Since last visit, tried primidone '250mg'$  BID, but not much tremor benefit; also having more sleepiness in the day time. Still with hand > voice > chin tremor. No falls. Balance getting slightly worse.  UPDATE 05/22/14: Since last visit, tremor progressing. Now notes new resting tremor in the last year. Some balance and walking issues. She is s/p cervical decompression in Sept 2015, with good results.   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 Her tremor does not limit any of her activities of daily life. She denies any side effects of the Mysoline. She also reports today that she's had some numbness and tingling down her right arm from her neck and shoulder. It has been bothering her more in the last 6 months. 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 She saw Dr. Arnoldo Morale back in 2004.  She returns for reevaluation  UPDATE 06/07/12 (VRP): Doing well. BUE tremor is stable and controlled. Voice tremor is slightly progressing. Tolerating primidone.  PRIOR HPI (06/09/11, VRP):  81 year old female returns for F/U. Last seen  06/09/10.  She has been followed in this office for many years for essential tremor. She takes primidone 50 mg, 1.5 TID. She tolerates it well. She comes in today saying that her tremor is a little worse before lunch.  The tremor limits her a little bit with eating and writing but for the most part she is happy with her level of function.She has hx of breast cancer.  No neurologic complaints.    REVIEW OF SYSTEMS: Full 14 system review of systems performed and negative except: as per HPI.    ALLERGIES: Allergies  Allergen Reactions   Shellfish Allergy Shortness Of Breath   Duloxetine Itching and Rash   Sulfa Drugs Cross Reactors Anxiety and Rash   Lisinopril     Other reaction(s): cough Other reaction(s): cough Other reaction(s): cough   Lyrica [Pregabalin] Other (See Comments)    "made me drunk, couldn't function"   Other     Other reaction(s): hyper   Sulfa Antibiotics Other (See Comments)    Other reaction(s): hyper, break out in a rash Other reaction(s): hyper, break out in a rash   Codeine Anxiety    Other reaction(s): hyper Other reaction(s): hyper    HOME MEDICATIONS: Outpatient Medications Prior to Visit  Medication Sig Dispense Refill   albuterol (VENTOLIN HFA) 108 (90 Base) MCG/ACT inhaler Inhale 1-2 puffs into the lungs every 6 (six) hours as needed for wheezing or shortness of breath. 18 g 1   amLODipine (NORVASC) 2.5 MG tablet Take one tablet each day. 90 tablet 3   atorvastatin (LIPITOR) 20 MG tablet Take 20 mg by mouth daily.     budesonide-formoterol (SYMBICORT) 160-4.5 MCG/ACT inhaler 2 puffs     CALCIUM CARBONATE PO Take 1 tablet by mouth 2 (two) times daily.     Cholecalciferol (VITAMIN D) 1000 UNITS capsule Take 1,000 Units by mouth 2 (two) times daily.     Coenzyme Q10 (CO Q 10) 100 MG CAPS Take by mouth.     cyclobenzaprine (FLEXERIL) 5 MG tablet TAKE 1 TO UP TO 2 TABLETS BY MOUTH ONCE DAILY AT BEDTIME AS NEEDED FOR MUSCLE SPASM 50 tablet 11   EPINEPHrine  0.3 mg/0.3 mL IJ SOAJ injection Inject 0.3 mg into the muscle as needed. 2 each 1   famotidine (PEPCID) 20 MG tablet Take 1 tablet by mouth once daily 30 tablet 5   fluticasone (FLOVENT HFA) 110 MCG/ACT inhaler 1 puff     hydrocortisone (ANUSOL-HC) 2.5 % rectal cream Procto-Med HC 2.5 % topical cream perineal applicator  APPLY CREAM RECTALLY TO AFFECTED AREA TWICE DAILY     ipratropium (ATROVENT) 0.03 % nasal spray Place 2 sprays into both nostrils 2 (two) times daily. 30 mL 5   ketotifen (ZADITOR) 0.025 % ophthalmic solution      levocetirizine (XYZAL) 5 MG tablet TAKE 1 TABLET BY MOUTH ONCE DAILY IN THE EVENING . APPOINTMENT REQUIRED FOR FUTURE REFILLS 30 tablet 5   metoprolol succinate (TOPROL-XL) 25 MG 24 hr tablet Take 1 tablet (25 mg total) by mouth at bedtime. 90 tablet 3   nystatin ointment (MYCOSTATIN) nystatin 100,000 unit/gram topical ointment     Polyethyl Glycol-Propyl Glycol (SYSTANE OP) Place 1  drop into both eyes 2 (two) times daily.     primidone (MYSOLINE) 50 MG tablet Take 2 tablets (100 mg total) by mouth 3 (three) times daily. 540 tablet 4   traMADol (ULTRAM) 50 MG tablet TAKE 1 TABLET BY MOUTH EVERY 12 HOURS AS NEEDED 60 tablet 5   triamcinolone ointment (KENALOG) 0.1 % triamcinolone acetonide 0.1 % topical ointment     venlafaxine (EFFEXOR) 75 MG tablet Take 1 tablet (75 mg total) by mouth daily. 90 tablet 1   carbidopa-levodopa (SINEMET IR) 25-100 MG tablet Take 2 tablets by mouth 3 (three) times daily. 540 tablet 4   No facility-administered medications prior to visit.    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 2011- no further tx. now- [name] , [name]   Cyst of finger 11/2011   annular cyst right long finger   Dental crowns present    Dermatitis     Frequency of urination    GERD (gastroesophageal reflux disease)    Hemorrhoid    History of colon polyps 2009   Also noted 2012 and 2017 by colonoscopy.   History of shingles 1971   Had right ischial recurrence in the March 2019   Hyperlipidemia    On atorvastatin   Hypertension    under control, has been on med. x 2 yrs.   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 Leta Baptist), neurosurgeon Dr. Arnoldo Morale   PONV (postoperative nausea and vomiting)    Seasonal allergies    Shingles of eyelid 1971   Right eyelid; recurrent -> last episode 05/11/2017   Tremors of nervous system    hands-essential tremor; associated with Parkinson's.   Trigger finger of right hand 11/2011   long finger    PAST SURGICAL HISTORY: Past Surgical History:  Procedure Laterality Date   14-day Zio Patch Monitor  08/2020   (report to be scanned): Predominant SR w/ HR 61 to 142 bpm and average 86 bpm.  Total of 59 episodes of PAT/PSVT  4 to 15 beats (not noted on diary).  Fastest was 5 beats at a rate of 193 bpm, longest was 15 beats at a rate of 106 bpm.  Rare isolated PACs (as well as couplets and triplets) with rare isolated PVCs.  No sustained arrhythmias or bradycardia to explain syncope.   ANTERIOR CERVICAL DECOMPRESSION/DISCECTOMY FUSION 4 LEVELS N/A 11/14/2013   Procedure: ANTERIOR CERVICAL DECOMPRESSION/DISCECTOMY FUSION 4 LEVELS;  Surgeon: Newman Pies, MD;  Location: [facility];  Service: Neurosurgery;  Laterality: N/A;  C34 C45 C56 C67 anterior cervical fusion with interbody prosthesis plating and  bonegraft   APPENDECTOMY  age 47   BREAST LUMPECTOMY  06/16/2009   left; SLN bx.   BREAST LUMPECTOMY  07/01/2009   re-excision   BREAST SURGERY  02/22/1997   reduction   CATARACT EXTRACTION, BILATERAL     COLONOSCOPY WITH PROPOFOL N/A 12/03/2014   Procedure: COLONOSCOPY WITH PROPOFOL;  Surgeon: [name], MD;  Location: [facility]  ENDOSCOPY;  Service: Endoscopy;  Laterality: N/A;   FOOT SURGERY  06/22/2011   left   KNEE ARTHROSCOPY  03/12/2005   right   KNEE ARTHROSCOPY     left   Lower Extremity Venous  Dopplers  12/12/2020   No DVT bilaterally in the deep veins or superficial veins.  No deep or superficial venous reflux noted bilaterally with exception of Right SSV at the knee.   NASAL SINUS SURGERY     x 2   NEPHRECTOMY LIVING DONOR Left 04/22/2008   donated to spouse 2010([facility])   TRANSTHORACIC ECHOCARDIOGRAM  12/10/2020   EF 60 to 65%.  Normal LV size and function.  No heart WMA.  GR 1 DD.  Normal RV, RVP and RAP.[name]  Normal valves.   TRIGGER FINGER RELEASE  12/16/2011   Procedure: RELEASE TRIGGER FINGER/A-1 PULLEY;  Surgeon: Tennis Must, MD;  Location: [facility];  Service: Orthopedics;  Laterality: Right;  RIGHT LONG FINGER TRIGGER RELEASE & ANNULAR CYST EXCISION   TUMOR EXCISION  age 36   right arm    FAMILY HISTORY: Family History  Problem Relation Age of Onset   Kidney disease Mother    Stroke Father    Stroke Sister        08/2015   Diabetes Brother    Heart disease Brother    Ovarian cancer Sister 74   Heart disease Paternal Grandmother    Heart disease Paternal Grandfather    Leukemia Other 66       brother's grandson   Bladder Cancer Brother 75   Colon cancer Niece        dx in late 34s    SOCIAL HISTORY:  Social History   Socioeconomic History   Marital status: Widowed    Spouse name: [name]-David   Number of children: 1   Years of education: HS   Highest education level: Not on file  Occupational History   Occupation: Retired   Occupation: Housewife  Tobacco Use   Smoking status: Never   Smokeless tobacco: Never  Vaping Use   Vaping Use: Never used  Substance and Sexual Activity   Alcohol use: No    Alcohol/week: 0.0 standard drinks of alcohol   Drug use: No   Sexual activity: Not Currently  Other Topics Concern   Not on file  Social History  Narrative   10/13/20    Recently widowed-lives alone, husband [name] passed 04/26/2020    Patient has 1 daughter who lives in [place], [place]-> 1 grandson who lives in [place]   Patient has a HS education.    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    Patient is right handed.    Patient drinks caffeine occasionally.     Social Determinants of Health   Financial Resource Strain: Not on file  Food Insecurity: Not on file  Transportation Needs: Not on file  Physical Activity: Not on file  Stress: Not on file  Social Connections: Not on file  Intimate Partner Violence: Not on file    PHYSICAL EXAM  Vitals:   10/13/21 1347  BP: (!) 155/99  Pulse: 86  Weight: 129 lb (58.5 kg)  Height: 5' (1.524 m)   Body mass index is 25.19 kg/m.  GENERAL EXAM: Patient is in no distress  CARDIOVASCULAR: Regular rate and rhythm, no murmurs, no carotid bruits  NEUROLOGIC: MENTAL STATUS: awake, alert, language fluent, comprehension intact, naming intact; MASKED FACIES; NEG FRONTAL RELEASE SIGNS    05/23/2017    3:34 PM  MMSE - Mini Mental State Exam  Orientation to time 5  Orientation to Place 5  Registration 3  Attention/ Calculation 1  Recall 2  Language- name 2  objects 2  Language- repeat 1  Language- follow 3 step command 3  Language- read & follow direction 1  Write a sentence 1  Copy design 1  Total score 25    CRANIAL NERVE: pupils equal and reactive to light, visual fields full to confrontation, extraocular muscles intact, no nystagmus, facial sensation and strength symmetric, uvula midline, shoulder shrug symmetric, tongue midline; SOFT HOARSE VOICE MOTOR: normal bulk, POSTURAL AND ACTION TREMOR; RARE REST TREMOR IN RUE > LUE; INT MOUTH TREMOR; BRADYKINESIA IN BUE AND BLE; LEFT SLOWER THAN RIGHT; MILD COGWHEELING IN RUE > LUE; full strength in the BUE, BLE; VOICE TREMOR SENSORY: normal and symmetric to light touch COORDINATION: finger-nose-finger,  fine finger movements normal REFLEXES: deep tendon reflexes present and symmetric GAIT/STATION: narrow based gait; SLOW AND CAUTIOUS, DECR RIGHT ARM SWING    DIAGNOSTIC DATA (LABS, IMAGING, TESTING) - I reviewed patient records, labs, notes, testing and imaging myself where available.  Lab Results  Component Value Date   WBC 5.2 01/20/2015   HGB 12.0 01/20/2015   HCT 36.1 01/20/2015   MCV 88.4 01/20/2015   PLT 293 01/20/2015      Component Value Date/Time   NA 142 07/30/2020 1141   NA 136 01/20/2015 1326   K 4.5 07/30/2020 1141   K 4.1 01/20/2015 1326   CL 100 07/30/2020 1141   CL 102 06/30/2012 1231   CO2 25 07/30/2020 1141   CO2 27 01/20/2015 1326   GLUCOSE 89 07/30/2020 1141   GLUCOSE 66 (L) 01/20/2015 1326   GLUCOSE 77 06/30/2012 1231   BUN 11 07/30/2020 1141   BUN 13.9 01/20/2015 1326   CREATININE 0.65 07/30/2020 1141   CREATININE 0.9 01/20/2015 1326   CALCIUM 9.1 07/30/2020 1141   CALCIUM 8.8 01/20/2015 1326   PROT 7.4 01/20/2015 1326   ALBUMIN 3.6 01/20/2015 1326   AST 17 01/20/2015 1326   ALT 13 01/20/2015 1326   ALKPHOS 86 01/20/2015 1326   BILITOT <0.30 01/20/2015 1326   GFRNONAA 83 (L) 11/06/2013 1032   GFRAA >90 11/06/2013 1032   No results found for: "CHOL", "HDL", "LDLCALC", "LDLDIRECT", "TRIG", "CHOLHDL" No results found for: "HGBA1C" No results found for: "VITAMINB12" No results found for: "TSH"   MRI brain - unremarkable per patient (done ~ early 2000's)  06/25/13 EMG/NCS 1. 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 2. No evidence of widespread underlying large fiber neuropathy.  11/14/13 CXR [I reviewed images myself and agree with interpretation. -VRP]  - Anterior cervical disc fusion localization images as described.  10/11/18 MRI lumbar spine  MRI lumbar spine (without) demonstrating: - At L4-5: disc bulging and facet  hypertrophy with severe right and moderate left foraminal stenosis. - At L3-4: disc bulging and facet hypertrophy with moderate right and mild left foraminal stenosis. - At L5-S1: disc bulging and facet hypertrophy with mild right and moderate left foraminal stenosis.    ASSESSMENT AND PLAN  81 y.o. 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 here with essential tremor  since 1990's. Now with intermittent resting tremor AND bradykinesia since 2016. May represent essential tremor with superimposed parkinson's disease + cervical myelopathy sequelae. DATscan ordered but denied (to confirm superimposed parkinsonism on top of essential tremor and cervical myelopathy).  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)  Parkinson's disease ([place]) - Plan: Ambulatory referral to Physical Therapy  Left leg swelling - Plan: US Venous Img Upper Bilat (DVT)  Gait difficulty - Plan: Ambulatory referral to Physical Therapy     PLAN:    PARKINSONISM  - continue carb/levo 2 tabs three times a day; add entacapone '200mg'$  to each dose due to wearing off - consider rasagiline, nourianz or rytary in future - caution with walking and balance; use cane / walker as needed - [facility]; after weighing benefits and risks, patient holding off for now due to some concern of potential complications  LEFT LEG SWELLING (2 days; slightly in right  leg) - lower ext u/s  LEG PAIN (aching, knees, calves) - due to lumbar radiculopathies, arthritis (knees), deconditioning, parkinson's dz - continue PT exercises; planning to transition to [facility] exercise  ESSENTIAL TREMOR - continue primidone to '100mg'$  three times a day - consider DBS as above  SPASMODIC DYSPHONIA - continue ENT / 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    Dispense:  90 tablet    Refill:  12   carbidopa-levodopa (SINEMET IR) 25-100 MG tablet    Sig: Take 2 tablets by mouth 3 (three) times daily.    Dispense:  540 tablet    Refill:  4   primidone (MYSOLINE) 50 MG tablet    Sig: Take 2 tablets (100 mg total) by mouth 3 (three) times daily.    Dispense:  540 tablet    Refill:  4   Return in about 6 months (around 04/15/2022).     Penni Bombard, MD 0/34/7425, 9:56 PM Certified in Neurology, Neurophysiology and Neuroimaging  [facility] [address] [place], [place] [address] [phone]

## 2021-10-13 NOTE — Patient Instructions (Signed)
PARKINSONISM  - continue carb/levo 2 tabs three times a day; add entacapone '200mg'$  to each dose due to wearing off

## 2021-10-14 ENCOUNTER — Ambulatory Visit: Payer: Medicare Other | Admitting: Diagnostic Neuroimaging

## 2021-10-14 ENCOUNTER — Ambulatory Visit
Admission: RE | Admit: 2021-10-14 | Discharge: 2021-10-14 | Disposition: A | Discharge status: Home / Self Care | Payer: Medicare Other | Source: Ambulatory Visit | Attending: Diagnostic Neuroimaging | Admitting: Diagnostic Neuroimaging

## 2021-10-14 DIAGNOSIS — M7989 Other specified soft tissue disorders: Secondary | ICD-10-CM | POA: Diagnosis not present

## 2021-10-22 ENCOUNTER — Ambulatory Visit: Payer: Medicare Other | Attending: Diagnostic Neuroimaging | Admitting: Physical Therapy

## 2021-10-22 DIAGNOSIS — G20 Parkinson's disease: Secondary | ICD-10-CM | POA: Diagnosis not present

## 2021-10-22 DIAGNOSIS — R269 Unspecified abnormalities of gait and mobility: Secondary | ICD-10-CM | POA: Diagnosis not present

## 2021-10-22 DIAGNOSIS — R293 Abnormal posture: Secondary | ICD-10-CM | POA: Diagnosis not present

## 2021-10-22 DIAGNOSIS — R2681 Unsteadiness on feet: Secondary | ICD-10-CM | POA: Insufficient documentation

## 2021-10-22 DIAGNOSIS — R2689 Other abnormalities of gait and mobility: Secondary | ICD-10-CM | POA: Diagnosis not present

## 2021-10-22 NOTE — Therapy (Signed)
OUTPATIENT PHYSICAL THERAPY NEURO EVALUATION   Patient Name: April Church MRN: [id number] DOB:04-Nov-1940, 81 y.o., female Today's Date: 10/22/2021   PCP: Leeroy Cha, MD REFERRING PROVIDER: Penni Bombard, MD    PT End of Session - 10/22/21 1232     Visit Number 1    Number of Visits 13   Plus eval   Date for PT Re-Evaluation 12/10/21    Authorization Type Medicare A & B    Progress Note Due on Visit 10    PT Start Time 1230    PT Stop Time 1307    PT Time Calculation (min) 37 min    Activity Tolerance Patient tolerated treatment well    Behavior During Therapy [facility] for tasks assessed/performed             Past Medical History:  Diagnosis Date   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 2011- no further tx. now- [name] , [name]   Cyst of finger 11/2011   annular cyst right long finger   Dental crowns present    Dermatitis    Frequency of urination    GERD (gastroesophageal reflux disease)    Hemorrhoid    History of colon polyps 2009   Also noted 2012 and 2017 by colonoscopy.   History of shingles 1971   Had right ischial recurrence in the March 2019   Hyperlipidemia    On atorvastatin   Hypertension    under control, has been on med. x 2 yrs.   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 Leta Baptist), neurosurgeon Dr. Arnoldo Morale   PONV (postoperative nausea and vomiting)    Seasonal allergies    Shingles of eyelid 1971   Right eyelid; recurrent -> last episode 05/11/2017   Tremors of nervous system    hands-essential tremor; associated with Parkinson's.   Trigger finger of right hand 11/2011   long finger   Past Surgical History:  Procedure Laterality Date   14-day Zio Patch Monitor  08/2020   (report to be scanned):  Predominant SR w/ HR 61 to 142 bpm and average 86 bpm.  Total of 59 episodes of PAT/PSVT  4 to 15 beats (not noted on diary).  Fastest was 5 beats at a rate of 193 bpm, longest was 15 beats at a rate of 106 bpm.  Rare isolated PACs (as well as couplets and triplets) with rare isolated PVCs.  No sustained arrhythmias or bradycardia to explain syncope.   ANTERIOR CERVICAL DECOMPRESSION/DISCECTOMY FUSION 4 LEVELS N/A 11/14/2013   Procedure: ANTERIOR CERVICAL DECOMPRESSION/DISCECTOMY FUSION 4 LEVELS;  Surgeon: Newman Pies, MD;  Location: [facility];  Service: Neurosurgery;  Laterality: N/A;  C34 C45 C56 C67 anterior cervical fusion with interbody prosthesis plating and  bonegraft   APPENDECTOMY  age 70   BREAST LUMPECTOMY  06/16/2009   left; SLN bx.   BREAST LUMPECTOMY  07/01/2009   re-excision   BREAST SURGERY  02/22/1997   reduction   CATARACT EXTRACTION, BILATERAL     COLONOSCOPY WITH PROPOFOL N/A 12/03/2014   Procedure: COLONOSCOPY WITH PROPOFOL;  Surgeon: [name], MD;  Location: [facility];  Service: Endoscopy;  Laterality: N/A;   FOOT SURGERY  06/22/2011   left   KNEE ARTHROSCOPY  03/12/2005  right   KNEE ARTHROSCOPY     left   Lower Extremity Venous Dopplers  12/12/2020   No DVT bilaterally in the deep veins or superficial veins.  No deep or superficial venous reflux noted bilaterally with exception of Right SSV at the knee.   NASAL SINUS SURGERY     x 2   NEPHRECTOMY LIVING DONOR Left 04/22/2008   donated to spouse 2010([facility])   TRANSTHORACIC ECHOCARDIOGRAM  12/10/2020   EF 60 to 65%.  Normal LV size and function.  No heart WMA.  GR 1 DD.  Normal RV, RVP and RAP.[name]  Normal valves.   TRIGGER FINGER RELEASE  12/16/2011   Procedure: RELEASE TRIGGER FINGER/A-1 PULLEY;  Surgeon: Tennis Must, MD;  Location: [facility];  Service: Orthopedics;  Laterality: Right;  RIGHT LONG FINGER TRIGGER RELEASE & ANNULAR CYST EXCISION   TUMOR EXCISION  age 76   right  arm   Patient Active Problem List   Diagnosis Date Noted   Moderate persistent asthma without complication 31/49/7026   PSVT (paroxysmal supraventricular tachycardia) ([place]) 11/26/2020   Syncope and collapse 11/24/2020   Primary osteoarthritis of right knee 05/12/2020   Primary osteoarthritis of left knee 05/12/2020   Hyperlipidemia    Bilateral calf pain 03/05/2019   Genetic testing 02/13/2019   Family history of ovarian cancer    Family history of bladder cancer    Family history of colon cancer    Family history of leukemia    Lumbar radiculopathy 12/08/2018   Myofascial pain 12/08/2018   Impaired gait and mobility 12/08/2018   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   Chronic nonallergic rhinitis 12/29/2016   Mild persistent asthma, uncomplicated 37/85/8850   Vocal fold paralysis, 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   Gastroesophageal reflux disease 12/29/2016   Cervical spondylosis with myelopathy and radiculopathy 11/14/2013   Essential tremor 06/13/2013   Cervical spondylosis without myelopathy 06/13/2013   Breast cancer of lower-outer quadrant of left female breast ([place]) 09/10/2010    ONSET DATE: 10/13/2021   REFERRING DIAG: G20 (ICD-10-CM) - Parkinson's disease ([place]) R26.9 (ICD-10-CM) - Gait difficulty   THERAPY DIAG:  Other abnormalities of gait and mobility  Unsteadiness on feet  Abnormal posture  Rationale for Evaluation and Treatment Rehabilitation  SUBJECTIVE:                                                                                                                                                                                              SUBJECTIVE STATEMENT: "I am not as steady on my feet as I was but I am trying not to get a cane or walker, that is  why I am here".  Denies falls but reports some near-misses w/turning too quickly. States she did water aerobics at the [facility] for a few months but it was not as "good" as doing aquatic therapy here and did not return. Has been  walking outside for 10-20 minutes every other day.   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    PAIN:  Are you having pain? No Pt reports she frequently has pain in both knees, receives injections in B knees every 25moand states the injections are effective for about 245moIs due for next round of injections in last week of September.   PRECAUTIONS: Fall  WEIGHT BEARING RESTRICTIONS No  FALLS: Has patient fallen in last 6 months? No  LIVING ENVIRONMENT: Lives with: lives alone Lives in: House/apartment Stairs: Yes: Internal: full flight steps; can reach both and External: 3 steps; can reach both Has following equipment at home: Grab bars  PLOF: Independent  PATIENT GOALS "Getting around better" "Being able to stay independent for as long as I can"   OBJECTIVE:   COGNITION: Overall cognitive status: Within functional limits for tasks assessed Spasmodic Dysphonia    SENSATION: Pt denies numbness/tingling    EDEMA:  Pt reports she had idiopathic swelling in her LLE last week. Dr. PeLeta Baptisterformed doppler and test was unremarkable. Swelling has since gone away.   POSTURE: rounded shoulders, forward head, posterior pelvic tilt, and weight shift right   BED MOBILITY:  Independent per pt   TRANSFERS: Assistive device utilized: None  Sit to stand: SBA Stand to sit: SBA   GAIT: Gait pattern:  absent heel strike bilaterally, step through pattern,  decreased arm swing- Right, decreased arm swing- Left, decreased step length- Right, decreased step length- Left, decreased hip/knee flexion- Right, decreased hip/knee flexion- Left, decreased ankle dorsiflexion- Right, decreased ankle dorsiflexion- Left, knee flexed in stance- Right, knee flexed in stance- Left, shuffling, lateral lean- Right, poor foot clearance- Right, and poor foot clearance- Left Distance walked: Various clinic distances Assistive device utilized: None Level of assistance: SBA Comments: No instability noted but pt demonstrates decreased cadence and shuffling of RLE > LLE  FUNCTIONAL TESTs:   [facility] PT Assessment - 10/22/21 1254       Transfers   Five time sit to stand comments  21.66s w/hands braced on thighs   Noted signifcant lean to R side     Ambulation/Gait   Gait velocity 32.8' over 12.82s = 2.55 ft/s without AD      Balance   Balance Assessed Yes      Standardized Balance Assessment   Standardized Balance Assessment Timed Up and Go Test      Timed Up and Go Test   Normal TUG (seconds) 13.28    Cognitive TUG (seconds) 23.97   Instructed pt to retro count by 3s starting at 72. She did ascending counting by 1 starting at 8050            TODAY'S TREATMENT:  Next Session   PATIENT EDUCATION: Education details: Eval findings, POC Person educated: Patient Education method: ExCustomer service manager  Education comprehension: verbalized understanding   HOME EXERCISE PROGRAM: [id number] - to be reviewed from previous bout of therapy     GOALS: Goals reviewed with patient? Yes  SHORT TERM GOALS: Target date: 11/12/2021  Pt will improve gait velocity to at least 2.8 ft/s w/LRAD for improved gait efficiency   Baseline: 2.55 ft/s without AD Goal status: INITIAL  2.  MiniBest to be performed and LTG written Baseline:  Goal status: INITIAL  3.  Pt will improve 5 x STS to less than or equal to 18 seconds without UE support to demonstrate  improved functional strength and transfer efficiency.   Baseline: 21.66s without UE support  Goal status: INITIAL  4. Pt will trial various assistive devices to determine safest option for functional mobility to improve independence   Baseline: Pt not wanting to use AD   Goal Status: INITIAL   LONG TERM GOALS: Target date: 12/03/2021  Pt will be independent with final HEP for improved strength, balance, transfers and gait.  Baseline:  Goal status: INITIAL  2.  MiniBest goal  Baseline:  Goal status: INITIAL  3.  Pt will verbalize fall prevention strategies in the home for fall risk reduction and safety at home  Baseline:  Goal status: INITIAL  4.  Pt will improve gait velocity to at least 3.0 ft/s with LRAD for improved gait efficiency and independence  Baseline: 2.55 ft/s without AD Goal status: INITIAL  5.  Pt will improve 5 x STS to less than or equal to 15 seconds without UE support to demonstrate improved functional strength and transfer efficiency.   Baseline: 21.66s without UE support  Goal status: INITIAL   ASSESSMENT:  CLINICAL IMPRESSION: Patient is a 81 year old female referred to Neuro OPPT for PD. 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 The following deficits were present during the exam: decreased endurance, impaired postural control, gait impairments 2/2 PD and decreased safety awareness. Based on gait speed, 5x STS and postural instability, pt is an  incr risk for falls. Will further assess balance next session. Pt would benefit from skilled PT to address these impairments and functional limitations to maximize functional mobility independence.    OBJECTIVE IMPAIRMENTS Abnormal gait, decreased activity tolerance, decreased balance, decreased knowledge of use of DME, decreased mobility, difficulty walking, decreased safety awareness, and pain.   ACTIVITY LIMITATIONS carrying, lifting, bending, squatting, stairs, transfers, reach over head, and locomotion level  PARTICIPATION LIMITATIONS: shopping, community activity, and yard work  [place], Fitness, and 1 comorbidity: chronic bilateral knee and lumbar pain  are also affecting patient's functional outcome.   REHAB POTENTIAL: Good  CLINICAL DECISION MAKING: Evolving/moderate complexity  EVALUATION COMPLEXITY: Moderate  PLAN: PT FREQUENCY: 2x/week  PT DURATION: 6 weeks  PLANNED INTERVENTIONS: Therapeutic exercises, Therapeutic activity, Neuromuscular re-education, Balance training, Gait training, Patient/Family education, Self Care, Stair training, DME instructions, Aquatic Therapy, Manual therapy, and Re-evaluation  PLAN FOR NEXT SESSION: MiniBest, review HEP from previous bout of therapy and add to it prn, postural exercises, scifit    Anahi Belmar E Gavina Dildine, PT, DPT 10/22/2021, 1:12 PM

## 2021-10-28 ENCOUNTER — Ambulatory Visit: Payer: Medicare Other | Attending: Diagnostic Neuroimaging | Admitting: Physical Therapy

## 2021-10-28 DIAGNOSIS — R2681 Unsteadiness on feet: Secondary | ICD-10-CM

## 2021-10-28 DIAGNOSIS — R2689 Other abnormalities of gait and mobility: Secondary | ICD-10-CM

## 2021-10-28 DIAGNOSIS — R278 Other lack of coordination: Secondary | ICD-10-CM | POA: Diagnosis not present

## 2021-10-28 DIAGNOSIS — R293 Abnormal posture: Secondary | ICD-10-CM

## 2021-10-28 DIAGNOSIS — M6281 Muscle weakness (generalized): Secondary | ICD-10-CM | POA: Diagnosis not present

## 2021-10-28 NOTE — Therapy (Signed)
OUTPATIENT PHYSICAL THERAPY NEURO TREATMENT   Patient Name: April Church MRN: [id number] DOB:Jan 01, 1941, 81 y.o., female Today's Date: 10/28/2021   PCP: Leeroy Cha, MD REFERRING PROVIDER: Penni Bombard, MD    PT End of Session - 10/28/21 1318     Visit Number 2    Number of Visits 13   Plus eval   Date for PT Re-Evaluation 12/10/21    Authorization Type Medicare A & B    Progress Note Due on Visit 10    PT Start Time 1316    PT Stop Time 1359    PT Time Calculation (min) 43 min    Equipment Utilized During Treatment Gait belt    Activity Tolerance Patient tolerated treatment well    Behavior During Therapy WFL for tasks assessed/performed              Past Medical History:  Diagnosis Date   Acquired solitary kidney 04/2008   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 2011- no further tx. now- [name] , [name]   Cyst of finger 11/2011   annular cyst right long finger   Dental crowns present    Dermatitis    Frequency of urination    GERD (gastroesophageal reflux disease)    Hemorrhoid    History of colon polyps 2009   Also noted 2012 and 2017 by colonoscopy.   History of shingles 1971   Had right ischial recurrence in the March 2019   Hyperlipidemia    On atorvastatin   Hypertension    under control, has been on med. x 2 yrs.   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 Leta Baptist), neurosurgeon Dr. Arnoldo Morale   PONV (postoperative nausea and vomiting)    Seasonal allergies    Shingles of eyelid 1971   Right eyelid; recurrent -> last episode 05/11/2017   Tremors of nervous system    hands-essential tremor; associated with Parkinson's.   Trigger finger of right hand 11/2011   long finger   Past Surgical History:  Procedure Laterality Date   14-day Zio Patch  Monitor  08/2020   (report to be scanned): Predominant SR w/ HR 61 to 142 bpm and average 86 bpm.  Total of 59 episodes of PAT/PSVT  4 to 15 beats (not noted on diary).  Fastest was 5 beats at a rate of 193 bpm, longest was 15 beats at a rate of 106 bpm.  Rare isolated PACs (as well as couplets and triplets) with rare isolated PVCs.  No sustained arrhythmias or bradycardia to explain syncope.   ANTERIOR CERVICAL DECOMPRESSION/DISCECTOMY FUSION 4 LEVELS N/A 11/14/2013   Procedure: ANTERIOR CERVICAL DECOMPRESSION/DISCECTOMY FUSION 4 LEVELS;  Surgeon: Newman Pies, MD;  Location: [facility];  Service: Neurosurgery;  Laterality: N/A;  C34 C45 C56 C67 anterior cervical fusion with interbody prosthesis plating and  bonegraft   APPENDECTOMY  age 17   BREAST LUMPECTOMY  06/16/2009   left; SLN bx.   BREAST LUMPECTOMY  07/01/2009   re-excision   BREAST SURGERY  02/22/1997   reduction   CATARACT EXTRACTION, BILATERAL     COLONOSCOPY WITH PROPOFOL N/A 12/03/2014   Procedure: COLONOSCOPY WITH PROPOFOL;  Surgeon: [name], MD;  Location: [facility];  Service: Endoscopy;  Laterality: N/A;   FOOT SURGERY  06/22/2011  left   KNEE ARTHROSCOPY  03/12/2005   right   KNEE ARTHROSCOPY     left   Lower Extremity Venous Dopplers  12/12/2020   No DVT bilaterally in the deep veins or superficial veins.  No deep or superficial venous reflux noted bilaterally with exception of Right SSV at the knee.   NASAL SINUS SURGERY     x 2   NEPHRECTOMY LIVING DONOR Left 04/22/2008   donated to spouse 2010([facility])   TRANSTHORACIC ECHOCARDIOGRAM  12/10/2020   EF 60 to 65%.  Normal LV size and function.  No heart WMA.  GR 1 DD.  Normal RV, RVP and RAP.[name]  Normal valves.   TRIGGER FINGER RELEASE  12/16/2011   Procedure: RELEASE TRIGGER FINGER/A-1 PULLEY;  Surgeon: Tennis Must, MD;  Location: [facility];  Service: Orthopedics;  Laterality: Right;  RIGHT LONG FINGER TRIGGER RELEASE & ANNULAR CYST  EXCISION   TUMOR EXCISION  age 70   right arm   Patient Active Problem List   Diagnosis Date Noted   Moderate persistent asthma without complication 75/11/2583   PSVT (paroxysmal supraventricular tachycardia) (HCC) 11/26/2020   Syncope and collapse 11/24/2020   Primary osteoarthritis of right knee 05/12/2020   Primary osteoarthritis of left knee 05/12/2020   Hyperlipidemia    Bilateral calf pain 03/05/2019   Genetic testing 02/13/2019   Family history of ovarian cancer    Family history of bladder cancer    Family history of colon cancer    Family history of leukemia    Lumbar radiculopathy 12/08/2018   Myofascial pain 12/08/2018   Impaired gait and mobility 12/08/2018   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   Chronic nonallergic rhinitis 12/29/2016   Mild persistent asthma, uncomplicated 27/78/2423   Vocal fold paralysis, 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   Gastroesophageal reflux disease 12/29/2016   Cervical spondylosis with myelopathy and radiculopathy 11/14/2013   Essential tremor 06/13/2013   Cervical spondylosis without myelopathy 06/13/2013   Breast cancer of lower-outer quadrant of left female breast ([place]) 09/10/2010    ONSET DATE: 10/13/2021   REFERRING DIAG: G20 (ICD-10-CM) - Parkinson's disease ([place]) R26.9 (ICD-10-CM) - Gait difficulty   THERAPY DIAG:  Other abnormalities of gait and mobility  Unsteadiness on feet  Abnormal posture  Rationale for Evaluation and Treatment Rehabilitation  SUBJECTIVE:                                                                                                                                                                                              SUBJECTIVE STATEMENT: Pt reports she is doing well, went to [facility] this past weekend and walked  on the beach with her family. Did not measure how far she walked but she was able to keep up with her family. No new changes.   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    PAIN:  Are you having pain? No Pt reports she frequently has pain in both knees, receives injections in B knees every 5moand states the injections are effective for about 257moIs due for next round of injections in last week of September.   PRECAUTIONS: Fall  FALLS: Has patient fallen in last 6 months? No  PLOF: Independent  PATIENT GOALS "Getting around better" "Being able to stay independent for as long as I can"   OBJECTIVE:   COGNITION: Overall cognitive status: Within functional limits for tasks assessed Spasmodic Dysphonia     POSTURE: rounded shoulders, forward head, posterior pelvic tilt, and weight shift right   TODAY'S TREATMENT:  Ther Act   [facility] - 10/28/21 1322       Balance   Balance Assessed Yes      Standardized Balance Assessment   Standardized Balance Assessment Mini-BESTest      Mini-BESTest   Sit To Stand Normal: Comes to stand without use of hands and stabilizes independently.    Rise to Toes Moderate: Heels up, but not full range (smaller than when holding hands), OR noticeable instability for 3 s.    Stand on one leg (left) Moderate: < 20 s   <2s   Stand on one leg (right) Moderate: < 20 s   6.06s   Stand on one leg - lowest score 1    Compensatory Stepping Correction - Forward Moderate: More than one step is required to recover equilibrium   3 small steps   Compensatory Stepping Correction - Backward Moderate: More than  one step is required to recover equilibrium   2 small steps   Compensatory Stepping Correction - Left Lateral Moderate: Several steps to recover equilibrium   3 small steps   Compensatory Stepping Correction - Right Lateral Severe:  Falls, or cannot step    Stepping Corredtion Lateral - lowest score 0    Stance - Feet together, eyes open, firm surface  Normal: 30s    Stance - Feet together, eyes closed, foam surface  Moderate: < 30s   <2s   Incline - Eyes Closed Moderate: Stands independently < 30s OR aligns with surface   <4s   Change in Gait Speed Moderate: Unable to change walking speed or signs of imbalance    Walk with head turns - Horizontal Moderate: performs head turns with reduction in gait speed.   Ipsilateral lateral deviations   Walk with pivot turns Moderate:Turns with feet close SLOW (>4 steps) with good balance.    Step over obstacles Moderate: Steps over box but touches box OR displays cautious behavior by slowing gait.    Timed UP & GO with Dual Task Moderate: Dual Task affects either counting OR walking (>10%) when compared to the TUG without Dual Task.    Mini-BEST total score 15  NMR  Reviewed and updated HEP from previous bout of therapy (see bolded below) for improved reactive balance strategies, BLE strength and retropulsion correction:   - Standing on old pillows in corner with eyes open and feet close together, 2x60s hold. Noted minor A/P sway and R lateral lean but no major LOB    - Standing in corner on old pillows with head turns, 2x45s. Min cues to turn head only, as pt rotating at pelvis to maintain balance. Noted increased difficulty w/balance when turning to R side.    - Standing on foam in corner with eyes closed, 4x10s. Pt w/significant difficulty maintaining balance w/EC and frequently losing balance posterolaterally to R side.    - Sit to Stand Without Arm Support, x10 reps w/visual demonstration for proper body mechanics using NDT  principles for anterior weight shift. Pt demonstrated difficulty coming to stand on first try but improved w/added practice.    GAIT: Gait pattern: absent heel strike bilaterally, step through pattern, decreased arm swing- Right, decreased arm swing- Left, decreased step length- Right, decreased step length- Left, decreased hip/knee flexion- Right, decreased hip/knee flexion- Left, decreased ankle dorsiflexion- Right, decreased ankle dorsiflexion- Left, knee flexed in stance- Right, knee flexed in stance- Left, shuffling, lateral lean- Right, poor foot clearance- Right, and poor foot clearance- Left Distance walked: Various clinic distances Assistive device utilized: None Level of assistance: SBA Comments: No instability noted but pt demonstrates decreased cadence and shuffling of RLE > LLE   PATIENT EDUCATION: Education details: Updated HEP, MiniBest assessment  Person educated: Patient Education method: Explanation and Demonstration Education comprehension: verbalized understanding   HOME EXERCISE PROGRAM: Access Code: [email or web address] URL: [email or web address] Date: 10/28/2021 Prepared by: Mickie Bail Damonie Furney  Exercises - Seated Hamstring Stretch  - 1 x daily - 5 x weekly - 1 sets - 3 reps - 15 sec hold - Standing on old pillows in corner with eyes open and feet close together  - 1 x daily - 7 x weekly - 1 sets - 4 reps - 30-45 second hold - Standing in corner on old pillows with head turns   - 1 x daily - 7 x weekly - 1 sets - 4 reps - 30-45 second hold - Standing on foam in corner with eyes closed   - 1 x daily - 7 x weekly - 1 sets - 4 reps - 15-30 second hold - Sit to Stand Without Arm Support  - 1 x daily - 7 x weekly - 3 sets - 10 reps    GOALS: Goals reviewed with patient? Yes  SHORT TERM GOALS: Target date: 11/12/2021  Pt will improve gait velocity to at least 2.8 ft/s w/LRAD for improved gait efficiency   Baseline: 2.55 ft/s without AD Goal status: INITIAL  2.   MiniBest to be performed and LTG written Baseline: 15/28 on 9/6 Goal status: MET  3.  Pt will improve 5 x STS to less than or equal to 18 seconds without UE support to demonstrate improved functional strength and transfer efficiency.   Baseline: 21.66s without UE support  Goal status: INITIAL  4. Pt will trial various assistive devices to determine safest option for functional mobility to improve independence   Baseline: Pt not wanting to use AD   Goal Status: INITIAL   LONG TERM GOALS: Target date: 12/03/2021  Pt will be independent with final HEP for improved strength, balance, transfers and gait.  Baseline:  Goal status: INITIAL  2.  Pt will improve MiniBest to 20/28  for decreased fall risk and improvement with compensatory stepping strategies.   Baseline: 15/28 Goal status: INITIAL  3.  Pt will verbalize fall prevention strategies in the home for fall risk reduction and safety at home  Baseline:  Goal status: INITIAL  4.  Pt will improve gait velocity to at least 3.0 ft/s with LRAD for improved gait efficiency and independence  Baseline: 2.55 ft/s without AD Goal status: INITIAL  5.  Pt will improve 5 x STS to less than or equal to 15 seconds without UE support to demonstrate improved functional strength and transfer efficiency.   Baseline: 21.66s without UE support  Goal status: INITIAL   ASSESSMENT:  CLINICAL IMPRESSION: Emphasis of skilled PT session on balance assessment and review of HEP from previous bout of therapy. Pt achieved a 15/28 on MiniBest, indicative of high fall risk. Pt exhibited the greatest difficulty with standing balance with eyes closed and head movements w/ambulatory tasks. Pt also demonstrated minor freezing w/turns. Updated pt's HEP to address deficits highlighted by Minibest and pt's concern w/difficulty coming to stand. Continue POC.    OBJECTIVE IMPAIRMENTS Abnormal gait, decreased activity tolerance, decreased balance, decreased  knowledge of use of DME, decreased mobility, difficulty walking, decreased safety awareness, and pain.   ACTIVITY LIMITATIONS carrying, lifting, bending, squatting, stairs, transfers, reach over head, and locomotion level  PARTICIPATION LIMITATIONS: shopping, community activity, and yard work  Adrian Age, Fitness, and 1 comorbidity: chronic bilateral knee and lumbar pain  are also affecting patient's functional outcome.   REHAB POTENTIAL: Good  CLINICAL DECISION MAKING: Evolving/moderate complexity  EVALUATION COMPLEXITY: Moderate  PLAN: PT FREQUENCY: 2x/week  PT DURATION: 6 weeks  PLANNED INTERVENTIONS: Therapeutic exercises, Therapeutic activity, Neuromuscular re-education, Balance training, Gait training, Patient/Family education, Self Care, Stair training, DME instructions, Aquatic Therapy, Manual therapy, and Re-evaluation  PLAN FOR NEXT SESSION: tandem gait, retropulsion correction, balance on unlevel surfaces (especially w/EC), postural exercises, scifit    Lyndell Gillyard E Ashaki Frosch, PT, DPT 10/28/2021, 1:59 PM

## 2021-11-02 ENCOUNTER — Ambulatory Visit: Payer: Medicare Other | Admitting: Physical Therapy

## 2021-11-02 DIAGNOSIS — R2681 Unsteadiness on feet: Secondary | ICD-10-CM | POA: Diagnosis not present

## 2021-11-02 DIAGNOSIS — R293 Abnormal posture: Secondary | ICD-10-CM | POA: Diagnosis not present

## 2021-11-02 DIAGNOSIS — M6281 Muscle weakness (generalized): Secondary | ICD-10-CM | POA: Diagnosis not present

## 2021-11-02 DIAGNOSIS — R2689 Other abnormalities of gait and mobility: Secondary | ICD-10-CM | POA: Diagnosis not present

## 2021-11-02 DIAGNOSIS — R278 Other lack of coordination: Secondary | ICD-10-CM | POA: Diagnosis not present

## 2021-11-02 NOTE — Therapy (Signed)
OUTPATIENT PHYSICAL THERAPY NEURO TREATMENT   Patient Name: April Church MRN: [id number] DOB:01-31-1941, 81 y.o., female Today's Date: 11/02/2021   PCP: Leeroy Cha, MD REFERRING PROVIDER: Penni Bombard, MD    PT End of Session - 11/02/21 1022     Visit Number 3    Number of Visits 13   Plus eval   Date for PT Re-Evaluation 12/10/21    Authorization Type Medicare A & B    Progress Note Due on Visit 10    PT Start Time 1021   Previous pt session ran late   PT Stop Time 1111    PT Time Calculation (min) 50 min    Equipment Utilized During Treatment Gait belt    Activity Tolerance Patient tolerated treatment well    Behavior During Therapy WFL for tasks assessed/performed              Past Medical History:  Diagnosis Date   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 2011- no further tx. now- [name] , [name]   Cyst of finger 11/2011   annular cyst right long finger   Dental crowns present    Dermatitis    Frequency of urination    GERD (gastroesophageal reflux disease)    Hemorrhoid    History of colon polyps 2009   Also noted 2012 and 2017 by colonoscopy.   History of shingles 1971   Had right ischial recurrence in the March 2019   Hyperlipidemia    On atorvastatin   Hypertension    under control, has been on med. x 2 yrs.   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 Leta Baptist), neurosurgeon Dr. Arnoldo Morale   PONV (postoperative nausea and vomiting)    Seasonal allergies    Shingles of eyelid 1971   Right eyelid; recurrent -> last episode 05/11/2017   Tremors of nervous system    hands-essential tremor; associated with Parkinson's.   Trigger finger of right hand 11/2011   long finger   Past Surgical History:  Procedure  Laterality Date   14-day Zio Patch Monitor  08/2020   (report to be scanned): Predominant SR w/ HR 61 to 142 bpm and average 86 bpm.  Total of 59 episodes of PAT/PSVT  4 to 15 beats (not noted on diary).  Fastest was 5 beats at a rate of 193 bpm, longest was 15 beats at a rate of 106 bpm.  Rare isolated PACs (as well as couplets and triplets) with rare isolated PVCs.  No sustained arrhythmias or bradycardia to explain syncope.   ANTERIOR CERVICAL DECOMPRESSION/DISCECTOMY FUSION 4 LEVELS N/A 11/14/2013   Procedure: ANTERIOR CERVICAL DECOMPRESSION/DISCECTOMY FUSION 4 LEVELS;  Surgeon: Newman Pies, MD;  Location: [place] [facility];  Service: Neurosurgery;  Laterality: N/A;  C34 C45 C56 C67 anterior cervical fusion with interbody prosthesis plating and  bonegraft   APPENDECTOMY  age 31   BREAST LUMPECTOMY  06/16/2009   left; SLN bx.   BREAST LUMPECTOMY  07/01/2009   re-excision   BREAST SURGERY  02/22/1997   reduction   CATARACT EXTRACTION, BILATERAL     COLONOSCOPY WITH PROPOFOL N/A 12/03/2014   Procedure: COLONOSCOPY WITH PROPOFOL;  Surgeon: [name], MD;  Location: [facility];  Service: Endoscopy;  Laterality: N/A;  FOOT SURGERY  06/22/2011   left   KNEE ARTHROSCOPY  03/12/2005   right   KNEE ARTHROSCOPY     left   Lower Extremity Venous Dopplers  12/12/2020   No DVT bilaterally in the deep veins or superficial veins.  No deep or superficial venous reflux noted bilaterally with exception of Right SSV at the knee.   NASAL SINUS SURGERY     x 2   NEPHRECTOMY LIVING DONOR Left 04/22/2008   donated to spouse 2010([facility])   TRANSTHORACIC ECHOCARDIOGRAM  12/10/2020   EF 60 to 65%.  Normal LV size and function.  No heart WMA.  GR 1 DD.  Normal RV, RVP and RAP.[name]  Normal valves.   TRIGGER FINGER RELEASE  12/16/2011   Procedure: RELEASE TRIGGER FINGER/A-1 PULLEY;  Surgeon: Tennis Must, MD;  Location: [place];  Service: Orthopedics;  Laterality: Right;  RIGHT LONG  FINGER TRIGGER RELEASE & ANNULAR CYST EXCISION   TUMOR EXCISION  age 46   right arm   Patient Active Problem List   Diagnosis Date Noted   Moderate persistent asthma without complication 35/32/9924   PSVT (paroxysmal supraventricular tachycardia) (HCC) 11/26/2020   Syncope and collapse 11/24/2020   Primary osteoarthritis of right knee 05/12/2020   Primary osteoarthritis of left knee 05/12/2020   Hyperlipidemia    Bilateral calf pain 03/05/2019   Genetic testing 02/13/2019   Family history of ovarian cancer    Family history of bladder cancer    Family history of colon cancer    Family history of leukemia    Lumbar radiculopathy 12/08/2018   Myofascial pain 12/08/2018   Impaired gait and mobility 12/08/2018   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   Chronic nonallergic rhinitis 12/29/2016   Mild persistent asthma, uncomplicated 26/83/4196   Vocal fold paralysis, 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   Gastroesophageal reflux disease 12/29/2016   Cervical spondylosis with myelopathy and radiculopathy 11/14/2013   Essential tremor 06/13/2013   Cervical spondylosis without myelopathy 06/13/2013   Breast cancer of lower-outer quadrant of left female breast ([place]) 09/10/2010    ONSET DATE: 10/13/2021   REFERRING DIAG: G20 (ICD-10-CM) - Parkinson's disease ([place]) R26.9 (ICD-10-CM) - Gait difficulty   THERAPY DIAG:  Other abnormalities of gait and mobility  Unsteadiness on feet  Abnormal posture  Rationale for Evaluation and Treatment Rehabilitation  SUBJECTIVE:                                                                                                                                                                                              SUBJECTIVE STATEMENT: Pt reports she is doing well, has been doing  her exercises every day and they are "so-so". No new falls.   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    PAIN:  Are you having pain? No Pt reports she frequently has pain in both knees, receives injections in B knees every 86moand states the injections are effective for about 265moIs due for next round of injections in last week of September.   PRECAUTIONS: Fall  FALLS: Has patient fallen in last 6 months? No  PLOF: Independent  PATIENT GOALS "Getting around better" "Being able to stay independent for as long as I can"   OBJECTIVE:   COGNITION: Overall cognitive status: Within functional limits for tasks assessed Spasmodic Dysphonia     POSTURE: rounded shoulders, forward head, posterior pelvic tilt, and weight shift right   TODAY'S TREATMENT:  NMR  In // bars for improved BLE coordination, single leg stability and reactive balance strategies:  -Rocker board in A/P direction, standing w/EO and BUE support [facility]/intermittent UE support. Pt demonstrated frequent LOB in posterior direction w/overcompensation in anterior direction, requiring min A to stabilize.  -Standing on blue Airex, alt cone taps w/intermittent UE support progressing to double cone taps, x10 per side. Noted pt had significant difficulty maintaining alternating sequence, requiring mod verbal cues to maintain proper form. Pt unable to perform double tap without UE support, no instance of retropulsion throughout.  -Standing on airex, ipsilateral cone grap and cross-body reach, x9 cones per side for improved thoracic rotation and reaching out of BOS. One instance of  posterior LOB due to pt turning too quickly, educated pt on slowing down to reduce instability w/turns.  -Alt step up on airex w/contralateral march, x10 per side w/light UE support. Despite max multimodal cues and visual biofeedback, pt unable to sequence correctly. Pt seemingly frustrated with her inability to coordinate movement and despite therapist providing concurrent visual cues, pt unable to perform. Discontinued intervention to prevent further pt frustration.  -Fwd and retro tandem gait w/BUE support, down and back x2. Pt unable to place feet properly w/retro gait > fwd and is unable to perform without reliance on BUEs.   Self-care/home management  Lengthy discussion regarding benefits of use of an AD, as pt states she cancelled an upcoming church trip due to fear of losing her balance or being center of attention, as others in her church group often "try to help" her walk. Pt very adamant against use of an AD due to embarrassment, so encouraged pt that use of an AD is prolonging her independence and allowing her to remain engaged in her social circle. Pt verbalized understanding and is willing to trial various ADs in clinic to determine if they will be a viable option for pt.    GAIT: Gait pattern: absent heel strike bilaterally, step through pattern, decreased arm swing- Right, decreased arm swing- Left, decreased step length- Right, decreased step length- Left, decreased hip/knee flexion- Right, decreased hip/knee flexion- Left, decreased ankle dorsiflexion- Right, decreased ankle dorsiflexion- Left, knee flexed in stance- Right, knee flexed in stance- Left, shuffling, lateral lean- Right, poor foot clearance- Right, and  poor foot clearance- Left Distance walked: Various clinic distances Assistive device utilized: None Level of assistance: SBA Comments: No instability noted but pt demonstrates decreased cadence and shuffling of RLE > LLE   PATIENT EDUCATION: Education details: See  self-care  Person educated: Patient Education method: Customer service manager Education comprehension: verbalized understanding   HOME EXERCISE PROGRAM: Access Code: [email or web address] URL: [email or web address] Date: 10/28/2021 Prepared by: Mickie Bail Nomie Buchberger  Exercises - Seated Hamstring Stretch  - 1 x daily - 5 x weekly - 1 sets - 3 reps - 15 sec hold - Standing on old pillows in corner with eyes open and feet close together  - 1 x daily - 7 x weekly - 1 sets - 4 reps - 30-45 second hold - Standing in corner on old pillows with head turns   - 1 x daily - 7 x weekly - 1 sets - 4 reps - 30-45 second hold - Standing on foam in corner with eyes closed   - 1 x daily - 7 x weekly - 1 sets - 4 reps - 15-30 second hold - Sit to Stand Without Arm Support  - 1 x daily - 7 x weekly - 3 sets - 10 reps    GOALS: Goals reviewed with patient? Yes  SHORT TERM GOALS: Target date: 11/12/2021  Pt will improve gait velocity to at least 2.8 ft/s w/LRAD for improved gait efficiency   Baseline: 2.55 ft/s without AD Goal status: INITIAL  2.  MiniBest to be performed and LTG written Baseline: 15/28 on 9/6 Goal status: MET  3.  Pt will improve 5 x STS to less than or equal to 18 seconds without UE support to demonstrate improved functional strength and transfer efficiency.   Baseline: 21.66s without UE support  Goal status: INITIAL  4. Pt will trial various assistive devices to determine safest option for functional mobility to improve independence   Baseline: Pt not wanting to use AD   Goal Status: INITIAL   LONG TERM GOALS: Target date: 12/03/2021  Pt will be independent with final HEP for improved strength, balance, transfers and gait.  Baseline:  Goal status: INITIAL  2.  Pt will improve MiniBest to 20/28 for decreased fall risk and improvement with compensatory stepping strategies.   Baseline: 15/28 Goal status: INITIAL  3.  Pt will verbalize fall prevention strategies in  the home for fall risk reduction and safety at home  Baseline:  Goal status: INITIAL  4.  Pt will improve gait velocity to at least 3.0 ft/s with LRAD for improved gait efficiency and independence  Baseline: 2.55 ft/s without AD Goal status: INITIAL  5.  Pt will improve 5 x STS to less than or equal to 15 seconds without UE support to demonstrate improved functional strength and transfer efficiency.   Baseline: 21.66s without UE support  Goal status: INITIAL   ASSESSMENT:  CLINICAL IMPRESSION: Emphasis of skilled PT session on reactive balance strategies, single leg stance, LE coordination and pt education. Pt demonstrates significant difficulty w/LE coordination tasks, unable to perform even w/concurrent cues. Pt states she has cancelled social outings due to fear of falling, so introduced topic of using an AD (which pt adamantly does not want) for improved confidence w/mobility and maintained independence. Pt in agreement to trial various ADs next session. Continue POC.    OBJECTIVE IMPAIRMENTS Abnormal gait, decreased activity tolerance, decreased balance, decreased knowledge of use of DME, decreased mobility, difficulty walking, decreased safety awareness, and pain.   ACTIVITY LIMITATIONS  carrying, lifting, bending, squatting, stairs, transfers, reach over head, and locomotion level  PARTICIPATION LIMITATIONS: shopping, community activity, and yard work  [place] Age, Fitness, and 1 comorbidity: chronic bilateral knee and lumbar pain  are also affecting patient's functional outcome.   REHAB POTENTIAL: Good  CLINICAL DECISION MAKING: Evolving/moderate complexity  EVALUATION COMPLEXITY: Moderate  PLAN: PT FREQUENCY: 2x/week  PT DURATION: 6 weeks  PLANNED INTERVENTIONS: Therapeutic exercises, Therapeutic activity, Neuromuscular re-education, Balance training, Gait training, Patient/Family education, Self Care, Stair training, DME instructions, Aquatic Therapy, Manual  therapy, and Re-evaluation  PLAN FOR NEXT SESSION: Trial use of rollator (pt has significant difficulty w/coordination - so do not think a cane is a good idea).  Intro dual-tasking, Curb negotiation, retropulsion correction, balance on unlevel surfaces (especially w/EC), postural exercises, scifit    Baljit Liebert E Layla Gramm, PT, DPT 11/02/2021, 11:22 AM

## 2021-11-04 ENCOUNTER — Encounter: Payer: Self-pay | Admitting: Physical Therapy

## 2021-11-04 ENCOUNTER — Other Ambulatory Visit: Payer: Self-pay | Admitting: Family

## 2021-11-04 ENCOUNTER — Ambulatory Visit: Payer: Medicare Other | Admitting: Physical Therapy

## 2021-11-04 ENCOUNTER — Telehealth: Payer: Self-pay | Admitting: Physical Therapy

## 2021-11-04 DIAGNOSIS — R2681 Unsteadiness on feet: Secondary | ICD-10-CM | POA: Diagnosis not present

## 2021-11-04 DIAGNOSIS — R278 Other lack of coordination: Secondary | ICD-10-CM

## 2021-11-04 DIAGNOSIS — R2689 Other abnormalities of gait and mobility: Secondary | ICD-10-CM

## 2021-11-04 DIAGNOSIS — M6281 Muscle weakness (generalized): Secondary | ICD-10-CM | POA: Diagnosis not present

## 2021-11-04 DIAGNOSIS — R293 Abnormal posture: Secondary | ICD-10-CM | POA: Diagnosis not present

## 2021-11-04 NOTE — Therapy (Signed)
OUTPATIENT PHYSICAL THERAPY NEURO TREATMENT   Patient Name: April Church MRN: [id number] DOB:December 19, 1940, 81 y.o., female Today's Date: 11/04/2021   PCP: Leeroy Cha, MD REFERRING PROVIDER: Penni Bombard, MD    PT End of Session - 11/04/21 1019     Visit Number 4    Number of Visits 13   Plus eval   Date for PT Re-Evaluation 12/10/21    Authorization Type Medicare A & B    Progress Note Due on Visit 10    PT Start Time 1018    PT Stop Time 1059    PT Time Calculation (min) 41 min    Equipment Utilized During Treatment Gait belt    Activity Tolerance Patient tolerated treatment well    Behavior During Therapy WFL for tasks assessed/performed              Past Medical History:  Diagnosis Date   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 2011- no further tx. now- [name] , [name]   Cyst of finger 11/2011   annular cyst right long finger   Dental crowns present    Dermatitis    Frequency of urination    GERD (gastroesophageal reflux disease)    Hemorrhoid    History of colon polyps 2009   Also noted 2012 and 2017 by colonoscopy.   History of shingles 1971   Had right ischial recurrence in the March 2019   Hyperlipidemia    On atorvastatin   Hypertension    under control, has been on med. x 2 yrs.   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 Leta Baptist), neurosurgeon Dr. Arnoldo Morale   PONV (postoperative nausea and vomiting)    Seasonal allergies    Shingles of eyelid 1971   Right eyelid; recurrent -> last episode 05/11/2017   Tremors of nervous system    hands-essential tremor; associated with Parkinson's.   Trigger finger of right hand 11/2011   long finger   Past Surgical History:  Procedure Laterality Date   14-day Zio Patch  Monitor  08/2020   (report to be scanned): Predominant SR w/ HR 61 to 142 bpm and average 86 bpm.  Total of 59 episodes of PAT/PSVT  4 to 15 beats (not noted on diary).  Fastest was 5 beats at a rate of 193 bpm, longest was 15 beats at a rate of 106 bpm.  Rare isolated PACs (as well as couplets and triplets) with rare isolated PVCs.  No sustained arrhythmias or bradycardia to explain syncope.   ANTERIOR CERVICAL DECOMPRESSION/DISCECTOMY FUSION 4 LEVELS N/A 11/14/2013   Procedure: ANTERIOR CERVICAL DECOMPRESSION/DISCECTOMY FUSION 4 LEVELS;  Surgeon: Newman Pies, MD;  Location: [facility];  Service: Neurosurgery;  Laterality: N/A;  C34 C45 C56 C67 anterior cervical fusion with interbody prosthesis plating and  bonegraft   APPENDECTOMY  age 31   BREAST LUMPECTOMY  06/16/2009   left; SLN bx.   BREAST LUMPECTOMY  07/01/2009   re-excision   BREAST SURGERY  02/22/1997   reduction   CATARACT EXTRACTION, BILATERAL     COLONOSCOPY WITH PROPOFOL N/A 12/03/2014   Procedure: COLONOSCOPY WITH PROPOFOL;  Surgeon: [name], MD;  Location: [facility];  Service: Endoscopy;  Laterality: N/A;   FOOT SURGERY  06/22/2011  left   KNEE ARTHROSCOPY  03/12/2005   right   KNEE ARTHROSCOPY     left   Lower Extremity Venous Dopplers  12/12/2020   No DVT bilaterally in the deep veins or superficial veins.  No deep or superficial venous reflux noted bilaterally with exception of Right SSV at the knee.   NASAL SINUS SURGERY     x 2   NEPHRECTOMY LIVING DONOR Left 04/22/2008   donated to spouse 2010([facility])   TRANSTHORACIC ECHOCARDIOGRAM  12/10/2020   EF 60 to 65%.  Normal LV size and function.  No heart WMA.  GR 1 DD.  Normal RV, RVP and RAP.[name]  Normal valves.   TRIGGER FINGER RELEASE  12/16/2011   Procedure: RELEASE TRIGGER FINGER/A-1 PULLEY;  Surgeon: Tennis Must, MD;  Location: [facility];  Service: Orthopedics;  Laterality: Right;  RIGHT LONG FINGER TRIGGER RELEASE & ANNULAR CYST  EXCISION   TUMOR EXCISION  age 32   right arm   Patient Active Problem List   Diagnosis Date Noted   Moderate persistent asthma without complication 29/52/8413   PSVT (paroxysmal supraventricular tachycardia) (HCC) 11/26/2020   Syncope and collapse 11/24/2020   Primary osteoarthritis of right knee 05/12/2020   Primary osteoarthritis of left knee 05/12/2020   Hyperlipidemia    Bilateral calf pain 03/05/2019   Genetic testing 02/13/2019   Family history of ovarian cancer    Family history of bladder cancer    Family history of colon cancer    Family history of leukemia    Lumbar radiculopathy 12/08/2018   Myofascial pain 12/08/2018   Impaired gait and mobility 12/08/2018   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   Chronic nonallergic rhinitis 12/29/2016   Mild persistent asthma, uncomplicated 24/40/1027   Vocal fold paralysis, 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   Gastroesophageal reflux disease 12/29/2016   Cervical spondylosis with myelopathy and radiculopathy 11/14/2013   Essential tremor 06/13/2013   Cervical spondylosis without myelopathy 06/13/2013   Breast cancer of lower-outer quadrant of left female breast ([place]) 09/10/2010    ONSET DATE: 10/13/2021   REFERRING DIAG: G20 (ICD-10-CM) - Parkinson's disease ([place]) R26.9 (ICD-10-CM) - Gait difficulty   THERAPY DIAG:  Other abnormalities of gait and mobility  Unsteadiness on feet  Abnormal posture  Rationale for Evaluation and Treatment Rehabilitation  SUBJECTIVE:                                                                                                                                                                                              SUBJECTIVE STATEMENT: Nothing new, no falls. Wants to keep working on the balance   Pt accompanied  by: self  PERTINENT HISTORY: Acquired solitary kidney (04/2008), Asthma, Breast cancer ([place]) (05/2009), Cyst of finger (11/2011), Dental crowns present, Dermatitis, Frequency of urination,  GERD (gastroesophageal reflux disease), Hemorrhoid, History of colon polyps (2009), History of shingles (1971), Hyperlipidemia, Hypertension, Liposarcoma of left shoulder ([employer]) (1969), Parkinson's disease ([place]), PONV (postoperative nausea and vomiting), Seasonal allergies, Shingles of eyelid (1971), Tremors of nervous system, and Trigger finger of right hand (11/2011).    PAIN:  Are you having pain? No Pt reports she frequently has pain in both knees, receives injections in B knees every 65mo and states the injections are effective for about 61mo. Is due for next round of injections in last week of September.   PRECAUTIONS: Fall  FALLS: Has patient fallen in last 6 months? No  PLOF: Independent  PATIENT GOALS "Getting around better" "Being able to stay independent for as long as I can"   OBJECTIVE:   COGNITION: Overall cognitive status: Within functional limits for tasks assessed Spasmodic Dysphonia     POSTURE: rounded shoulders, forward head, posterior pelvic tilt, and weight shift right   TODAY'S TREATMENT:  Self-care/home management  Reviewed discussion from previous session regarding benefits of using an AD to help prolong independence and to decr risk of falls and potential injury as pt initially not wanting to use one, but understands the benefits of using ones.    GAIT: Gait pattern: absent heel strike bilaterally, step through pattern, decreased arm swing- Right, decreased arm swing- Left, decreased step length- Right, decreased step length- Left, decreased hip/knee flexion- Right, decreased hip/knee flexion- Left, decreased ankle dorsiflexion- Right, decreased ankle dorsiflexion- Left, knee flexed in stance- Right, knee flexed in stance- Left, shuffling, lateral lean- Right, poor foot clearance- Right, and poor foot clearance- Left Distance walked: 345' x 1 indoors, 500' x 1 outdoors with Rollator, 230' x 1 with SPC.  Assistive device utilized: Rollator, SPC  Level of  assistance: SBA, CGA with SPC   Trialed rollator for AD with initial education on how to operate brakes. Performed x3 laps indoors with initial cues to stay closer to rollator, tall posture, and incr stride length. Pt did well with cues. Ambulated outdoors on paved surfaces with education on how to gently use brakes when going down inclines to slow speed down. Intermittent cues to stay closer to rollator. Pt demonstrating improved stride length throughout.   Also trialed use of SPC for 2 laps indoors around therapy gym with initial cues for sequencing (cane in LUE). Pt with difficulty with sequencing and needing intermittent cues throughout and for wider placement of cane (almost tripping over it a couple times with LLE), and for incr step length of LLE.  Pt with difficulty taking an incr step length and stepping through with LLE. Pt with tendency to look down to the floor and had one episode of min A when going around a curve.   Pt notes that she likes the cane better, but verbalizes understanding of why the rollator would be better for safety and her balance. PT educates that rollator would be the safest option, and would need further practice on if pt would even be safe to use a cane. PT to plan to send message to MD about an order for a rollator to obtain one from medical supply store. Also gave info on how to purchase on from [employer]. Pt would like to go through insurance.     PATIENT EDUCATION: Education details: See Self-Care/Gait section above.  Person educated: Patient Education method:  Explanation and Demonstration Education comprehension: verbalized understanding   HOME EXERCISE PROGRAM: Access Code: [email or web address] URL: [email or web address] Date: 10/28/2021 Prepared by: Mickie Bail Plaster  Exercises - Seated Hamstring Stretch  - 1 x daily - 5 x weekly - 1 sets - 3 reps - 15 sec hold - Standing on old pillows in corner with eyes open and feet close together  - 1 x daily - 7 x  weekly - 1 sets - 4 reps - 30-45 second hold - Standing in corner on old pillows with head turns   - 1 x daily - 7 x weekly - 1 sets - 4 reps - 30-45 second hold - Standing on foam in corner with eyes closed   - 1 x daily - 7 x weekly - 1 sets - 4 reps - 15-30 second hold - Sit to Stand Without Arm Support  - 1 x daily - 7 x weekly - 3 sets - 10 reps    GOALS: Goals reviewed with patient? Yes  SHORT TERM GOALS: Target date: 11/12/2021  Pt will improve gait velocity to at least 2.8 ft/s w/LRAD for improved gait efficiency   Baseline: 2.55 ft/s without AD Goal status: INITIAL  2.  MiniBest to be performed and LTG written Baseline: 15/28 on 9/6 Goal status: MET  3.  Pt will improve 5 x STS to less than or equal to 18 seconds without UE support to demonstrate improved functional strength and transfer efficiency.   Baseline: 21.66s without UE support  Goal status: INITIAL  4. Pt will trial various assistive devices to determine safest option for functional mobility to improve independence   Baseline: Pt not wanting to use AD   Goal Status: INITIAL   LONG TERM GOALS: Target date: 12/03/2021  Pt will be independent with final HEP for improved strength, balance, transfers and gait.  Baseline:  Goal status: INITIAL  2.  Pt will improve MiniBest to 20/28 for decreased fall risk and improvement with compensatory stepping strategies.   Baseline: 15/28 Goal status: INITIAL  3.  Pt will verbalize fall prevention strategies in the home for fall risk reduction and safety at home  Baseline:  Goal status: INITIAL  4.  Pt will improve gait velocity to at least 3.0 ft/s with LRAD for improved gait efficiency and independence  Baseline: 2.55 ft/s without AD Goal status: INITIAL  5.  Pt will improve 5 x STS to less than or equal to 15 seconds without UE support to demonstrate improved functional strength and transfer efficiency.   Baseline: 21.66s without UE support  Goal status:  INITIAL   ASSESSMENT:  CLINICAL IMPRESSION: Today's skilled session focused on trailing different AD for improved safety, balance and to decr fall risk. Pt needing only supervision with rollator and able to demo improved posture and stride length. Trialed use of a SPC with pt with difficulty coordinating/sequencing cane and with gait mechanics. Discussed with pt that rollator is the safest option at this time, with pt in agreement. PT to plan to request order for rollator from MD. Will continue to progress towards LTGs.    OBJECTIVE IMPAIRMENTS Abnormal gait, decreased activity tolerance, decreased balance, decreased knowledge of use of DME, decreased mobility, difficulty walking, decreased safety awareness, and pain.   ACTIVITY LIMITATIONS carrying, lifting, bending, squatting, stairs, transfers, reach over head, and locomotion level  PARTICIPATION LIMITATIONS: shopping, community activity, and yard work  [place] Age, Fitness, and 1 comorbidity: chronic bilateral knee and lumbar pain  are also affecting patient's  functional outcome.   REHAB POTENTIAL: Good  CLINICAL DECISION MAKING: Evolving/moderate complexity  EVALUATION COMPLEXITY: Moderate  PLAN: PT FREQUENCY: 2x/week  PT DURATION: 6 weeks  PLANNED INTERVENTIONS: Therapeutic exercises, Therapeutic activity, Neuromuscular re-education, Balance training, Gait training, Patient/Family education, Self Care, Stair training, DME instructions, Aquatic Therapy, Manual therapy, and Re-evaluation  PLAN FOR NEXT SESSION:  Did order for rollator come in? Practice rollator and going around obstacles, continue to try [facility] to see if it would be safe for shorter household distances? (Not sure about this one). Intro dual-tasking, Curb negotiation, retropulsion correction, balance on unlevel surfaces (especially w/EC), postural exercises, scifit    Arliss Journey, PT, DPT 11/04/2021, 11:01 AM

## 2021-11-04 NOTE — Telephone Encounter (Signed)
Order placed for rollator as requested by PT, [place].

## 2021-11-04 NOTE — Telephone Encounter (Signed)
Dr. Leta Baptist, April Church has been seen by PT at [facility]. She would benefit from an order for a 4 Wheeled Surveyor, mining) in order to improve balance/gait and to decr risk of falls.   If you agree, please place an order in [facility] workque in [facility] or fax the order to [phone]. Thank you, [name], PT, DPT 11/04/21 1:32 PM    [facility] [address] [place], [place]  [address] Phone:  [phone] Fax:  [phone]

## 2021-11-06 DIAGNOSIS — J385 Laryngeal spasm: Secondary | ICD-10-CM | POA: Diagnosis not present

## 2021-11-06 DIAGNOSIS — J383 Other diseases of vocal cords: Secondary | ICD-10-CM | POA: Diagnosis not present

## 2021-11-09 ENCOUNTER — Ambulatory Visit: Payer: Medicare Other | Admitting: Physical Therapy

## 2021-11-11 ENCOUNTER — Ambulatory Visit: Payer: Medicare Other

## 2021-11-11 DIAGNOSIS — R278 Other lack of coordination: Secondary | ICD-10-CM

## 2021-11-11 DIAGNOSIS — M6281 Muscle weakness (generalized): Secondary | ICD-10-CM | POA: Diagnosis not present

## 2021-11-11 DIAGNOSIS — R2689 Other abnormalities of gait and mobility: Secondary | ICD-10-CM | POA: Diagnosis not present

## 2021-11-11 DIAGNOSIS — R2681 Unsteadiness on feet: Secondary | ICD-10-CM

## 2021-11-11 DIAGNOSIS — R293 Abnormal posture: Secondary | ICD-10-CM

## 2021-11-11 NOTE — Therapy (Signed)
OUTPATIENT PHYSICAL THERAPY NEURO TREATMENT   Patient Name: April Church MRN: [id number] DOB:1940/06/02, 81 y.o., female Today's Date: 11/11/2021   PCP: Leeroy Cha, MD REFERRING PROVIDER: Penni Bombard, MD    PT End of Session - 11/11/21 1103     Visit Number 5    Number of Visits 13    Date for PT Re-Evaluation 12/10/21    Authorization Type Medicare A & B    Progress Note Due on Visit 10    PT Start Time 1102    PT Stop Time 1142    PT Time Calculation (min) 40 min    Equipment Utilized During Treatment Gait belt    Activity Tolerance Patient tolerated treatment well    Behavior During Therapy WFL for tasks assessed/performed              Past Medical History:  Diagnosis Date   Acquired solitary kidney 04/2008   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 2011- no further tx. now- [name] , [name]   Cyst of finger 11/2011   annular cyst right long finger   Dental crowns present    Dermatitis    Frequency of urination    GERD (gastroesophageal reflux disease)    Hemorrhoid    History of colon polyps 2009   Also noted 2012 and 2017 by colonoscopy.   History of shingles 1971   Had right ischial recurrence in the March 2019   Hyperlipidemia    On atorvastatin   Hypertension    under control, has been on med. x 2 yrs.   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 Leta Baptist), neurosurgeon Dr. Arnoldo Morale   PONV (postoperative nausea and vomiting)    Seasonal allergies    Shingles of eyelid 1971   Right eyelid; recurrent -> last episode 05/11/2017   Tremors of nervous system    hands-essential tremor; associated with Parkinson's.   Trigger finger of right hand 11/2011   long finger   Past Surgical History:  Procedure Laterality Date   14-day Zio Patch Monitor   08/2020   (report to be scanned): Predominant SR w/ HR 61 to 142 bpm and average 86 bpm.  Total of 59 episodes of PAT/PSVT  4 to 15 beats (not noted on diary).  Fastest was 5 beats at a rate of 193 bpm, longest was 15 beats at a rate of 106 bpm.  Rare isolated PACs (as well as couplets and triplets) with rare isolated PVCs.  No sustained arrhythmias or bradycardia to explain syncope.   ANTERIOR CERVICAL DECOMPRESSION/DISCECTOMY FUSION 4 LEVELS N/A 11/14/2013   Procedure: ANTERIOR CERVICAL DECOMPRESSION/DISCECTOMY FUSION 4 LEVELS;  Surgeon: Newman Pies, MD;  Location: [facility];  Service: Neurosurgery;  Laterality: N/A;  C34 C45 C56 C67 anterior cervical fusion with interbody prosthesis plating and  bonegraft   APPENDECTOMY  age 52   BREAST LUMPECTOMY  06/16/2009   left; SLN bx.   BREAST LUMPECTOMY  07/01/2009   re-excision   BREAST SURGERY  02/22/1997   reduction   CATARACT EXTRACTION, BILATERAL     COLONOSCOPY WITH PROPOFOL N/A 12/03/2014   Procedure: COLONOSCOPY WITH PROPOFOL;  Surgeon: [name], MD;  Location: [facility];  Service: Endoscopy;  Laterality: N/A;   FOOT SURGERY  06/22/2011   left  KNEE ARTHROSCOPY  03/12/2005   right   KNEE ARTHROSCOPY     left   Lower Extremity Venous Dopplers  12/12/2020   No DVT bilaterally in the deep veins or superficial veins.  No deep or superficial venous reflux noted bilaterally with exception of Right SSV at the knee.   NASAL SINUS SURGERY     x 2   NEPHRECTOMY LIVING DONOR Left 04/22/2008   donated to spouse 2010([facility])   TRANSTHORACIC ECHOCARDIOGRAM  12/10/2020   EF 60 to 65%.  Normal LV size and function.  No heart WMA.  GR 1 DD.  Normal RV, RVP and RAP.[name]  Normal valves.   TRIGGER FINGER RELEASE  12/16/2011   Procedure: RELEASE TRIGGER FINGER/A-1 PULLEY;  Surgeon: Tennis Must, MD;  Location: [facility];  Service: Orthopedics;  Laterality: Right;  RIGHT LONG FINGER TRIGGER RELEASE & ANNULAR CYST EXCISION    TUMOR EXCISION  age 89   right arm   Patient Active Problem List   Diagnosis Date Noted   Moderate persistent asthma without complication 40/81/4481   PSVT (paroxysmal supraventricular tachycardia) (HCC) 11/26/2020   Syncope and collapse 11/24/2020   Primary osteoarthritis of right knee 05/12/2020   Primary osteoarthritis of left knee 05/12/2020   Hyperlipidemia    Bilateral calf pain 03/05/2019   Genetic testing 02/13/2019   Family history of ovarian cancer    Family history of bladder cancer    Family history of colon cancer    Family history of leukemia    Lumbar radiculopathy 12/08/2018   Myofascial pain 12/08/2018   Impaired gait and mobility 12/08/2018   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   Chronic nonallergic rhinitis 12/29/2016   Mild persistent asthma, uncomplicated 85/63/1497   Vocal fold paralysis, 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   Gastroesophageal reflux disease 12/29/2016   Cervical spondylosis with myelopathy and radiculopathy 11/14/2013   Essential tremor 06/13/2013   Cervical spondylosis without myelopathy 06/13/2013   Breast cancer of lower-outer quadrant of left female breast ([place]) 09/10/2010    ONSET DATE: 10/13/2021   REFERRING DIAG: G20 (ICD-10-CM) - Parkinson's disease ([place]) R26.9 (ICD-10-CM) - Gait difficulty   THERAPY DIAG:  Other abnormalities of gait and mobility  Other lack of coordination  Muscle weakness (generalized)  Unsteadiness on feet  Abnormal posture  Rationale for Evaluation and Treatment Rehabilitation  SUBJECTIVE:                                                                                                                                                                                              SUBJECTIVE STATEMENT: Nothing new, no falls. 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    PAIN:  Are you having pain? No Pt reports she frequently has pain in both knees, receives injections in B knees every 20mo and states the injections are effective for about 77mo. Is due for next round of injections in last week of September.   PRECAUTIONS: Fall  FALLS: Has patient fallen in last 6 months? No  PLOF: Independent  PATIENT GOALS "Getting around better" "Being able to stay independent for as long as I can"   OBJECTIVE:   COGNITION: Overall cognitive status: Within functional limits for tasks assessed Spasmodic Dysphonia     POSTURE: rounded shoulders, forward head, posterior pelvic tilt, and weight shift right   TODAY'S TREATMENT:  [facility] PT Assessment - 11/11/21 0001       Standardized Balance Assessment   Standardized Balance Assessment Timed Up and Go Test    Five times sit to stand comments  17.34   no UE   10 Meter Walk .38m/s or 2.33ft/s      Mini-BESTest   Sit To Stand Normal: Comes to stand without use of hands and stabilizes independently.    Rise to Toes Moderate: Heels up, but not full range (smaller than when holding hands), OR noticeable instability for 3 s.    Stand on one leg (left) Moderate: < 20 s    Stand on one leg (right) Moderate: < 20 s    Stand on one leg - lowest score 1    Compensatory Stepping Correction - Forward Normal: Recovers independently with a single, large step (second realignement is allowed).    Compensatory Stepping Correction - Backward Moderate: More than one step is required to recover equilibrium     Compensatory Stepping Correction - Left Lateral Moderate: Several steps to recover equilibrium    Compensatory Stepping Correction - Right Lateral Moderate: Several steps to recover equilibrium    Stepping Corredtion Lateral - lowest score 1    Stance - Feet together, eyes open, firm surface  Normal: 30s    Stance - Feet together, eyes closed, foam surface  Moderate: < 30s    Incline - Eyes Closed Normal: Stands independently 30s and aligns with gravity    Change in Gait Speed Normal: Significantly changes walkling speed without imbalance    Walk with head turns - Horizontal Moderate: performs head turns with reduction in gait speed.    Walk with pivot turns Moderate:Turns with feet close SLOW (>4 steps) with good balance.    Step over obstacles Moderate: Steps over box but touches box OR displays cautious behavior by slowing gait.    Timed UP & GO with Dual Task Moderate: Dual Task affects either counting OR walking (>10%) when compared to the TUG without Dual Task.    Mini-BEST total score 19      Timed Up and Go Test   Normal TUG (seconds) 12.9    Cognitive TUG (seconds) 29.22            NMR:  -anterior/posterior stepping strategy over foam beam -> stepping onto Airex (compliant  surface)  -lateral stepping strategy over foam beam -> stepping onto Airex (compliant surface)  -WBOS on foam balance beam trunk rotation  -tandem stance on balance beam trunk rotation  -step and reach to target with difficulty dual motor tasking  -anterior step out with U UE support  -big reciprocal arm swing walk x230'     PATIENT EDUCATION: Education details: OM results, continue HEP Person educated: Patient Education method: Explanation and Demonstration Education comprehension: verbalized understanding   HOME EXERCISE PROGRAM: Access Code: [email or web address] URL: [email or web address] Date: 10/28/2021 Prepared by: Mickie Bail Plaster  Exercises - Seated Hamstring Stretch  - 1 x daily - 5  x weekly - 1 sets - 3 reps - 15 sec hold - Standing on old pillows in corner with eyes open and feet close together  - 1 x daily - 7 x weekly - 1 sets - 4 reps - 30-45 second hold - Standing in corner on old pillows with head turns   - 1 x daily - 7 x weekly - 1 sets - 4 reps - 30-45 second hold - Standing on foam in corner with eyes closed   - 1 x daily - 7 x weekly - 1 sets - 4 reps - 15-30 second hold - Sit to Stand Without Arm Support  - 1 x daily - 7 x weekly - 3 sets - 10 reps    GOALS: Goals reviewed with patient? Yes  SHORT TERM GOALS: Target date: 11/12/2021  Pt will improve gait velocity to at least 2.8 ft/s w/LRAD for improved gait efficiency   Baseline: 2.55 ft/s without AD; 2.55ft/s or .54m/s with no AD Goal status: MET  2.  MiniBest to be performed and LTG written Baseline: 15/28 on 9/6 Goal status: MET  3.  Pt will improve 5 x STS to less than or equal to 18 seconds without UE support to demonstrate improved functional strength and transfer efficiency.   Baseline: 21.66s without UE support; 17s no UE Goal status: MET  4. Pt will trial various assistive devices to determine safest option for functional mobility to improve independence   Baseline: Pt not wanting to use AD; trialed but didn't like  Goal Status: MET   LONG TERM GOALS: Target date: 12/03/2021  Pt will be independent with final HEP for improved strength, balance, transfers and gait.  Baseline:  Goal status: INITIAL  2.  Pt will improve MiniBest to 20/28 for decreased fall risk and improvement with compensatory stepping strategies.   Baseline: 15/28 Goal status: INITIAL  3.  Pt will verbalize fall prevention strategies in the home for fall risk reduction and safety at home  Baseline:  Goal status: INITIAL  4.  Pt will improve gait velocity to at least 3.0 ft/s with LRAD for improved gait efficiency and independence  Baseline: 2.55 ft/s without AD Goal status: INITIAL  5.  Pt will improve 5 x  STS to less than or equal to 15 seconds without UE support to demonstrate improved functional strength and transfer efficiency.   Baseline: 21.66s without UE support  Goal status: INITIAL   ASSESSMENT:  CLINICAL IMPRESSION: Patient seen for skilled PT session with emphasis on goal assessment and large amplitude, reciprocal movements. Five times Sit to Stand Test (FTSS) Method: Use a straight back chair with a solid seat that is 17-18" high. Ask participant to sit on the chair with arms folded across their chest.   Instructions: "Stand up and sit down as quickly as possible 5 times, keeping your  arms folded across your chest."   Measurement: Stop timing when the participant touches the chair in sitting the 5th time.  TIME: 17.34 sec  Cut off scores indicative of increased fall risk: >12 sec CVA, >16 sec PD, >13 sec vestibular (ANPTA Core Set of Outcome Measures for Adults with Neurologic Conditions, 2018). 10 Meter Walk Test: Patient instructed to walk 10 meters (32.8 ft) as quickly and as safely as possible at their normal speed x2 and at a fast speed x2. Time measured from 2 meter mark to 8 meter mark to accommodate ramp-up and ramp-down.  Normal speed: .76m/s Cut off scores: <0.4 m/s = household Ambulator, 0.4-0.8 m/s = limited community Ambulator, >0.8 m/s = community Ambulator, >1.2 m/s = crossing a street, <1.0 = increased fall risk MCID 0.05 m/s (small), 0.13 m/s (moderate), 0.06 m/s (significant)  (ANPTA Core Set of Outcome Measures for Adults with Neurologic Conditions, 2018). Patient demonstrates increased fall risk as noted by score of 19/28 on the MiniBESTest <16/28= predictive of falls in elderly, <17.5/28= predictive of falls in stroke, <19/28= predictive of falls in PD, <19.5/28= benefit from use of AD in MS MCID= 4 points  (Database of Knowledge Translation Tools Assessment Summary, 2017). Patient completed the Timed Up and Go test (TUG) in 12.9 seconds.  Geriatrics: need  for further assessment of fall risk: ? 12 sec; Recurrent falls: > 15 sec; Vestibular Disorders fall risk: > 15 sec; Parkinson's Disease fall risk: > 16 sec ([email or web address], 2023). TUG COG: 29.22s with inaccurate calculations. Patient noted to have difficulty with dual motor tasking requiring hand over hand assist consistently to complete task accurately. Continue POC.    OBJECTIVE IMPAIRMENTS Abnormal gait, decreased activity tolerance, decreased balance, decreased knowledge of use of DME, decreased mobility, difficulty walking, decreased safety awareness, and pain.   ACTIVITY LIMITATIONS carrying, lifting, bending, squatting, stairs, transfers, reach over head, and locomotion level  PARTICIPATION LIMITATIONS: shopping, community activity, and yard work  [employer] Age, Fitness, and 1 comorbidity: chronic bilateral knee and lumbar pain  are also affecting patient's functional outcome.   REHAB POTENTIAL: Good  CLINICAL DECISION MAKING: Evolving/moderate complexity  EVALUATION COMPLEXITY: Moderate  PLAN: PT FREQUENCY: 2x/week  PT DURATION: 6 weeks  PLANNED INTERVENTIONS: Therapeutic exercises, Therapeutic activity, Neuromuscular re-education, Balance training, Gait training, Patient/Family education, Self Care, Stair training, DME instructions, Aquatic Therapy, Manual therapy, and Re-evaluation  PLAN FOR NEXT SESSION:  Did order for rollator come in? Practice rollator and going around obstacles, continue to try [facility] to see if it would be safe for shorter household distances? (Not sure about this one). Intro dual-tasking, Curb negotiation, retropulsion correction, balance on unlevel surfaces (especially w/EC), postural exercises, scifit    Debbora Dus, PT, DPT Debbora Dus, PT, DPT, CBIS  11/11/2021, 11:47 AM

## 2021-11-16 ENCOUNTER — Ambulatory Visit: Payer: Medicare Other | Admitting: Physical Therapy

## 2021-11-16 DIAGNOSIS — M6281 Muscle weakness (generalized): Secondary | ICD-10-CM | POA: Diagnosis not present

## 2021-11-16 DIAGNOSIS — R2681 Unsteadiness on feet: Secondary | ICD-10-CM

## 2021-11-16 DIAGNOSIS — R278 Other lack of coordination: Secondary | ICD-10-CM | POA: Diagnosis not present

## 2021-11-16 DIAGNOSIS — R2689 Other abnormalities of gait and mobility: Secondary | ICD-10-CM

## 2021-11-16 DIAGNOSIS — R293 Abnormal posture: Secondary | ICD-10-CM | POA: Diagnosis not present

## 2021-11-16 NOTE — Therapy (Signed)
OUTPATIENT PHYSICAL THERAPY NEURO TREATMENT   Patient Name: April Church MRN: [id number] DOB:05/07/1940, 81 y.o., female Today's Date: 11/16/2021   PCP: Leeroy Cha, MD REFERRING PROVIDER: Penni Bombard, MD    PT End of Session - 11/16/21 1104     Visit Number 6    Number of Visits 13    Date for PT Re-Evaluation 12/10/21    Authorization Type Medicare A & B    Progress Note Due on Visit 10    PT Start Time 1103    PT Stop Time 1145    PT Time Calculation (min) 42 min    Equipment Utilized During Treatment Gait belt    Activity Tolerance Patient tolerated treatment well    Behavior During Therapy WFL for tasks assessed/performed               Past Medical History:  Diagnosis Date   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 2011- no further tx. now- [name] , [name]   Cyst of finger 11/2011   annular cyst right long finger   Dental crowns present    Dermatitis    Frequency of urination    GERD (gastroesophageal reflux disease)    Hemorrhoid    History of colon polyps 2009   Also noted 2012 and 2017 by colonoscopy.   History of shingles 1971   Had right ischial recurrence in the March 2019   Hyperlipidemia    On atorvastatin   Hypertension    under control, has been on med. x 2 yrs.   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 Leta Baptist), neurosurgeon Dr. Arnoldo Morale   PONV (postoperative nausea and vomiting)    Seasonal allergies    Shingles of eyelid 1971   Right eyelid; recurrent -> last episode 05/11/2017   Tremors of nervous system    hands-essential tremor; associated with Parkinson's.   Trigger finger of right hand 11/2011   long finger   Past Surgical History:  Procedure Laterality Date   14-day Zio Patch Monitor   08/2020   (report to be scanned): Predominant SR w/ HR 61 to 142 bpm and average 86 bpm.  Total of 59 episodes of PAT/PSVT  4 to 15 beats (not noted on diary).  Fastest was 5 beats at a rate of 193 bpm, longest was 15 beats at a rate of 106 bpm.  Rare isolated PACs (as well as couplets and triplets) with rare isolated PVCs.  No sustained arrhythmias or bradycardia to explain syncope.   ANTERIOR CERVICAL DECOMPRESSION/DISCECTOMY FUSION 4 LEVELS N/A 11/14/2013   Procedure: ANTERIOR CERVICAL DECOMPRESSION/DISCECTOMY FUSION 4 LEVELS;  Surgeon: Newman Pies, MD;  Location: [facility];  Service: Neurosurgery;  Laterality: N/A;  C34 C45 C56 C67 anterior cervical fusion with interbody prosthesis plating and  bonegraft   APPENDECTOMY  age 23   BREAST LUMPECTOMY  06/16/2009   left; SLN bx.   BREAST LUMPECTOMY  07/01/2009   re-excision   BREAST SURGERY  02/22/1997   reduction   CATARACT EXTRACTION, BILATERAL     COLONOSCOPY WITH PROPOFOL N/A 12/03/2014   Procedure: COLONOSCOPY WITH PROPOFOL;  Surgeon: [name], MD;  Location: [facility];  Service: Endoscopy;  Laterality: N/A;   FOOT SURGERY  06/22/2011   left  KNEE ARTHROSCOPY  03/12/2005   right   KNEE ARTHROSCOPY     left   Lower Extremity Venous Dopplers  12/12/2020   No DVT bilaterally in the deep veins or superficial veins.  No deep or superficial venous reflux noted bilaterally with exception of Right SSV at the knee.   NASAL SINUS SURGERY     x 2   NEPHRECTOMY LIVING DONOR Left 04/22/2008   donated to spouse 2010([facility])   TRANSTHORACIC ECHOCARDIOGRAM  12/10/2020   EF 60 to 65%.  Normal LV size and function.  No heart WMA.  GR 1 DD.  Normal RV, RVP and RAP.[name]  Normal valves.   TRIGGER FINGER RELEASE  12/16/2011   Procedure: RELEASE TRIGGER FINGER/A-1 PULLEY;  Surgeon: Tennis Must, MD;  Location: [facility];  Service: Orthopedics;  Laterality: Right;  RIGHT LONG FINGER TRIGGER RELEASE & ANNULAR CYST EXCISION    TUMOR EXCISION  age 24   right arm   Patient Active Problem List   Diagnosis Date Noted   Moderate persistent asthma without complication 02/40/9735   PSVT (paroxysmal supraventricular tachycardia) ([facility]) 11/26/2020   Syncope and collapse 11/24/2020   Primary osteoarthritis of right knee 05/12/2020   Primary osteoarthritis of left knee 05/12/2020   Hyperlipidemia    Bilateral calf pain 03/05/2019   Genetic testing 02/13/2019   Family history of ovarian cancer    Family history of bladder cancer    Family history of colon cancer    Family history of leukemia    Lumbar radiculopathy 12/08/2018   Myofascial pain 12/08/2018   Impaired gait and mobility 12/08/2018   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   Chronic nonallergic rhinitis 12/29/2016   Mild persistent asthma, uncomplicated 32/99/2426   Vocal fold paralysis, 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   Gastroesophageal reflux disease 12/29/2016   Cervical spondylosis with myelopathy and radiculopathy 11/14/2013   Essential tremor 06/13/2013   Cervical spondylosis without myelopathy 06/13/2013   Breast cancer of lower-outer quadrant of left female breast ([place]) 09/10/2010    ONSET DATE: 10/13/2021   REFERRING DIAG: G20 (ICD-10-CM) - Parkinson's disease ([place]) R26.9 (ICD-10-CM) - Gait difficulty   THERAPY DIAG:  Other abnormalities of gait and mobility  Other lack of coordination  Unsteadiness on feet  Abnormal posture  Rationale for Evaluation and Treatment Rehabilitation  SUBJECTIVE:                                                                                                                                                                                              SUBJECTIVE STATEMENT: Nothing new, no falls. Received botox injection to vocal cords last week and  still sounds very hoarse. 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    PAIN:  Are you having pain? Yes: NPRS scale: 3/10 Pain location: Bilateral knees (more in R) Pain description: Achy/throbbing Pt reports she frequently has pain in both knees, receives injections in B knees every 4moand states the injections are effective for about 212moIs due for next round of injections in last week of September.   PRECAUTIONS: Fall  FALLS: Has patient fallen in last 6 months? No  PLOF: Independent  PATIENT GOALS "Getting around better" "Being able to stay independent for as long as I can"   OBJECTIVE:   COGNITION: Overall cognitive status: Within functional limits for tasks assessed Spasmodic Dysphonia     POSTURE: rounded shoulders, forward head, posterior pelvic tilt, and weight shift right   TODAY'S TREATMENT: Self-care/home management  Short discussion regarding obtaining MD order for rollator and reasoning behind therapy recommending pt use it. Pt continues to state she does not want to use one, "but if I have to I will". Informed pt the purpose of rollator is to promote her functional independence and maintain her social obligations without fear of falling, pt verbalized understanding and agreement. At this time, not recommending and AD for home use, as pt has stairs to enter her home and feels confident in her home.   Ther Ex  SciFit multi-peaks level 8 for 8 minutes using BUE/BLEs for neural priming for reciprocal movement, dynamic cardiovascular warmup and increased amplitude of stepping.  RPE of 4/10 following activity   NMR On blue floor mat for dynamic standing balance, single leg stability, BLE coordination and stability on unlevel surfaces: -Fwd 4" hurdle navigation, down and back x5 alt leading leg each rep. Pt w/increased difficulty clearing hurdle w/RLE and frequently demonstrated circumduction despite cues. Intermittent UE support on counter throughout. CGA for safety.  -Lateral 4" hurdle navigation, x3 reps down and back. Noted difficulty coordinating proper BLE placement despite cues. CGA throughout, no UE support needed.  -Placed various objects under blue mat (dynadisc, gumdrops, stepping stones) and had pt ambulate fwd/sideways and retro over mat without UE support, x2 each direction. Noted significant difficulty w/retro direction and greatest challenge stepping onto/over dynadisc. Min-mod A throughout for safety   Sit <>stands on rockerboard in A/P direction for improved anterior weight shifting and immediate standing balance, x10 reps. Pt demonstrated significant retropulsion and bracing against mat to stabilize once standing. Noted poor eccentric control and heavy reliance on BUEs throughout. Provided mod mulitmodal cues for proper body positioning and anterior weight shift and pt able to perform final rep without UE support and bracing against mat.   PATIENT EDUCATION: Education details: Continue HEP, obtaining rollator order and will give to ChDarden RestaurantsPerson educated: Patient Education method: ExCustomer service managerducation comprehension: verbalized understanding   HOME EXERCISE PROGRAM: Access Code: [email or web address] URL: [email or web address] Date: 10/28/2021 Prepared by: JaMickie Baillaster  Exercises - Seated Hamstring Stretch  - 1 x daily - 5 x weekly - 1 sets - 3 reps - 15 sec hold -  Standing on old pillows in corner with eyes open and feet close together  - 1 x daily - 7 x weekly - 1 sets - 4 reps - 30-45 second hold - Standing in corner on old  pillows with head turns   - 1 x daily - 7 x weekly - 1 sets - 4 reps - 30-45 second hold - Standing on foam in corner with eyes closed   - 1 x daily - 7 x weekly - 1 sets - 4 reps - 15-30 second hold - Sit to Stand Without Arm Support  - 1 x daily - 7 x weekly - 3 sets - 10 reps    GOALS: Goals reviewed with patient? Yes  SHORT TERM GOALS: Target date: 11/12/2021  Pt will improve gait velocity to at least 2.8 ft/s w/LRAD for improved gait efficiency   Baseline: 2.55 ft/s without AD; 2.36f/s or .869m with no AD Goal status: MET  2.  MiniBest to be performed and LTG written Baseline: 15/28 on 9/6 Goal status: MET  3.  Pt will improve 5 x STS to less than or equal to 18 seconds without UE support to demonstrate improved functional strength and transfer efficiency.   Baseline: 21.66s without UE support; 17s no UE Goal status: MET  4. Pt will trial various assistive devices to determine safest option for functional mobility to improve independence   Baseline: Pt not wanting to use AD; trialed but didn't like  Goal Status: MET   LONG TERM GOALS: Target date: 12/03/2021  Pt will be independent with final HEP for improved strength, balance, transfers and gait.  Baseline:  Goal status: INITIAL  2.  Pt will improve MiniBest to 20/28 for decreased fall risk and improvement with compensatory stepping strategies.   Baseline: 15/28 Goal status: INITIAL  3.  Pt will verbalize fall prevention strategies in the home for fall risk reduction and safety at home  Baseline:  Goal status: INITIAL  4.  Pt will improve gait velocity to at least 3.0 ft/s with LRAD for improved gait efficiency and independence  Baseline: 2.55 ft/s without AD Goal status: INITIAL  5.  Pt will improve 5 x STS to less than or equal to 15 seconds without UE support to demonstrate improved functional strength and transfer efficiency.   Baseline: 21.66s without UE support  Goal status:  INITIAL   ASSESSMENT:  CLINICAL IMPRESSION: Emphasis of skilled PT session on dynamic standing balance, ankle strategy and anterior weight shifting. Pt limited by increased knee pain today, has appointment to receive botox injections this Friday. Pt continues to have difficulty w/dynamic balance tasks and retropulsion, specifically single leg stability on unlevel surfaces. Continue POC.     OBJECTIVE IMPAIRMENTS Abnormal gait, decreased activity tolerance, decreased balance, decreased knowledge of use of DME, decreased mobility, difficulty walking, decreased safety awareness, and pain.   ACTIVITY LIMITATIONS carrying, lifting, bending, squatting, stairs, transfers, reach over head, and locomotion level  PARTICIPATION LIMITATIONS: shopping, community activity, and yard work  PECochranvillege, Fitness, and 1 comorbidity: chronic bilateral knee and lumbar pain  are also affecting patient's functional outcome.   REHAB POTENTIAL: Good  CLINICAL DECISION MAKING: Evolving/moderate complexity  EVALUATION COMPLEXITY: Moderate  PLAN: PT FREQUENCY: 2x/week  PT DURATION: 6 weeks  PLANNED INTERVENTIONS: Therapeutic exercises, Therapeutic activity, Neuromuscular re-education, Balance training, Gait training, Patient/Family education, Self Care, Stair training, DME instructions, Aquatic Therapy, Manual therapy, and Re-evaluation  PLAN FOR NEXT SESSION:  Send in order for rollator. Sit<>stands on  rockerboard. Practice rollator and going around obstacles, continue to try [facility] to see if it would be safe for shorter household distances? (Not sure about this one). Intro dual-tasking, Curb negotiation, retropulsion correction, balance on unlevel surfaces (especially w/EC), postural exercises, scifit    Terianna Peggs E Moni Rothrock, PT, DPT 11/16/2021, 11:49 AM

## 2021-11-18 ENCOUNTER — Ambulatory Visit: Payer: Medicare Other | Admitting: Physical Therapy

## 2021-11-18 ENCOUNTER — Encounter: Payer: Self-pay | Admitting: Physical Therapy

## 2021-11-18 DIAGNOSIS — R2681 Unsteadiness on feet: Secondary | ICD-10-CM | POA: Diagnosis not present

## 2021-11-18 DIAGNOSIS — R278 Other lack of coordination: Secondary | ICD-10-CM | POA: Diagnosis not present

## 2021-11-18 DIAGNOSIS — R2689 Other abnormalities of gait and mobility: Secondary | ICD-10-CM

## 2021-11-18 DIAGNOSIS — M6281 Muscle weakness (generalized): Secondary | ICD-10-CM | POA: Diagnosis not present

## 2021-11-18 DIAGNOSIS — R293 Abnormal posture: Secondary | ICD-10-CM | POA: Diagnosis not present

## 2021-11-18 NOTE — Therapy (Signed)
OUTPATIENT PHYSICAL THERAPY NEURO TREATMENT   Patient Name: April Church MRN: [id number] DOB:1940/12/25, 81 y.o., female Today's Date: 11/18/2021   PCP: Leeroy Cha, MD REFERRING PROVIDER: Penni Bombard, MD    PT End of Session - 11/18/21 1105     Visit Number 7    Number of Visits 13    Date for PT Re-Evaluation 12/10/21    Authorization Type Medicare A & B    Progress Note Due on Visit 10    PT Start Time 1104    PT Stop Time 1145    PT Time Calculation (min) 41 min    Equipment Utilized During Treatment Gait belt    Activity Tolerance Patient tolerated treatment well    Behavior During Therapy WFL for tasks assessed/performed               Past Medical History:  Diagnosis Date   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 2011- no further tx. now- [name] , [name]   Cyst of finger 11/2011   annular cyst right long finger   Dental crowns present    Dermatitis    Frequency of urination    GERD (gastroesophageal reflux disease)    Hemorrhoid    History of colon polyps 2009   Also noted 2012 and 2017 by colonoscopy.   History of shingles 1971   Had right ischial recurrence in the March 2019   Hyperlipidemia    On atorvastatin   Hypertension    under control, has been on med. x 2 yrs.   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 Leta Baptist), neurosurgeon Dr. Arnoldo Morale   PONV (postoperative nausea and vomiting)    Seasonal allergies    Shingles of eyelid 1971   Right eyelid; recurrent -> last episode 05/11/2017   Tremors of nervous system    hands-essential tremor; associated with Parkinson's.   Trigger finger of right hand 11/2011   long finger   Past Surgical History:  Procedure Laterality Date   14-day Zio Patch Monitor   08/2020   (report to be scanned): Predominant SR w/ HR 61 to 142 bpm and average 86 bpm.  Total of 59 episodes of PAT/PSVT  4 to 15 beats (not noted on diary).  Fastest was 5 beats at a rate of 193 bpm, longest was 15 beats at a rate of 106 bpm.  Rare isolated PACs (as well as couplets and triplets) with rare isolated PVCs.  No sustained arrhythmias or bradycardia to explain syncope.   ANTERIOR CERVICAL DECOMPRESSION/DISCECTOMY FUSION 4 LEVELS N/A 11/14/2013   Procedure: ANTERIOR CERVICAL DECOMPRESSION/DISCECTOMY FUSION 4 LEVELS;  Surgeon: Newman Pies, MD;  Location: [facility];  Service: Neurosurgery;  Laterality: N/A;  C34 C45 C56 C67 anterior cervical fusion with interbody prosthesis plating and  bonegraft   APPENDECTOMY  age 36   BREAST LUMPECTOMY  06/16/2009   left; SLN bx.   BREAST LUMPECTOMY  07/01/2009   re-excision   BREAST SURGERY  02/22/1997   reduction   CATARACT EXTRACTION, BILATERAL     COLONOSCOPY WITH PROPOFOL N/A 12/03/2014   Procedure: COLONOSCOPY WITH PROPOFOL;  Surgeon: [name], MD;  Location: [facility];  Service: Endoscopy;  Laterality: N/A;   FOOT SURGERY  06/22/2011   left  KNEE ARTHROSCOPY  03/12/2005   right   KNEE ARTHROSCOPY     left   Lower Extremity Venous Dopplers  12/12/2020   No DVT bilaterally in the deep veins or superficial veins.  No deep or superficial venous reflux noted bilaterally with exception of Right SSV at the knee.   NASAL SINUS SURGERY     x 2   NEPHRECTOMY LIVING DONOR Left 04/22/2008   donated to spouse 2010([facility])   TRANSTHORACIC ECHOCARDIOGRAM  12/10/2020   EF 60 to 65%.  Normal LV size and function.  No heart WMA.  GR 1 DD.  Normal RV, RVP and RAP.[name]  Normal valves.   TRIGGER FINGER RELEASE  12/16/2011   Procedure: RELEASE TRIGGER FINGER/A-1 PULLEY;  Surgeon: Tennis Must, MD;  Location: [facility];  Service: Orthopedics;  Laterality: Right;  RIGHT LONG FINGER TRIGGER RELEASE & ANNULAR CYST EXCISION    TUMOR EXCISION  age 48   right arm   Patient Active Problem List   Diagnosis Date Noted   Moderate persistent asthma without complication 67/61/9509   PSVT (paroxysmal supraventricular tachycardia) ([facility]CC) 11/26/2020   Syncope and collapse 11/24/2020   Primary osteoarthritis of right knee 05/12/2020   Primary osteoarthritis of left knee 05/12/2020   Hyperlipidemia    Bilateral calf pain 03/05/2019   Genetic testing 02/13/2019   Family history of ovarian cancer    Family history of bladder cancer    Family history of colon cancer    Family history of leukemia    Lumbar radiculopathy 12/08/2018   Myofascial pain 12/08/2018   Impaired gait and mobility 12/08/2018   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   Chronic nonallergic rhinitis 12/29/2016   Mild persistent asthma, uncomplicated 32/67/1245   Vocal fold paralysis, 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   Gastroesophageal reflux disease 12/29/2016   Cervical spondylosis with myelopathy and radiculopathy 11/14/2013   Essential tremor 06/13/2013   Cervical spondylosis without myelopathy 06/13/2013   Breast cancer of lower-outer quadrant of left female breast ([place]) 09/10/2010    ONSET DATE: 10/13/2021   REFERRING DIAG: G20 (ICD-10-CM) - Parkinson's disease ([place]) R26.9 (ICD-10-CM) - Gait difficulty   THERAPY DIAG:  Other abnormalities of gait and mobility  Other lack of coordination  Unsteadiness on feet  Abnormal posture  Muscle weakness (generalized)  Rationale for Evaluation and Treatment Rehabilitation  SUBJECTIVE:                                                                                                                                                                                              SUBJECTIVE STATEMENT: Pt's voice is gradually coming back. Reports balance has  been good. No falls. Wants to work on the [employer] again today. Goes on Friday for her knee injections.   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    PAIN:  Are you having pain? Yes: NPRS scale: 4/10 Pain location: Bilateral knees (more in R) Pain description: Achy/throbbing Pt reports she frequently has pain in both knees, receives injections in B knees every 45mo and states the injections are effective for about 44mo. Is due for next round of injections in last week of September.   PRECAUTIONS: Fall  FALLS: Has patient fallen in last 6 months? No  PLOF: Independent  PATIENT GOALS "Getting around better" "Being able to stay independent for as long as I can"   OBJECTIVE:   COGNITION: Overall cognitive status: Within functional limits for tasks assessed Spasmodic Dysphonia    POSTURE: rounded shoulders, forward head, posterior pelvic tilt, and weight shift right   TODAY'S TREATMENT: NMR In corner on rockerboard in A/P directions: -Wall bumps for hip strategy and then bringing weight anteriorly x10 reps, cues for controlled weight shift and then bringing weight anteriorly away from wall and using glutes, pt with uncontrolled weight shifting posteriorly and has difficulty bringing weight anteriorly. Cues for tall posture in midline -Keeping board steady and looking over shoulder x10 reps each direction, intermittent taps to wall for balance.   On air ex: -With wide BOS, trunk rotations in corner reaching laterally x8 reps each side, then reaching superior/lateral to target x10 reps each side, cued to reset with  tall posture in midline.  -Performed wide BOS reaching up to targets with lateral weight shifting x10 reps each side, progressing to trying to lift up contralateral leg for dynamic SLS, x10 reps each side with min guard/min A needed for safety with balance. Pt asking about doing this at home. Discussed for safety, pt can perform at the countertop with UE support for safety on the level ground and reaching and lifting contralateral leg for SLS, x8 reps. Pt reporting she did not need a handout for this at home.   Performed sit <>stands on rockerboard in A/P direction for improved anterior weight shifting and immediate standing balance, x10 reps (repeated from last session due to pt's request). Cues for scooting out to the edge and incr forward lean for anterior weight shift, pt with intermittent BLE bracing against the mat table if she did not get weight enough anteriorly. Performed without UE support, sometimes taking a couple attempts to stand.   Pt reporting difficulty getting up from lower surfaces like her couch at home. Set up a lower 14" box with a thicker foam on top, with cued to scoot out towards the edge and counting to 3/using momentum with big forward lean each time to incr fluid motion of standing instead of getting "stuck". Performed x5 reps with pt with incr ease of standing from a lower surface. Pt to try this at home.   PATIENT EDUCATION: Education details: PT trying to send in order for rollator (fax has not been working to [employer]) Person educated: Patient Education method: Explanation Education comprehension: verbalized understanding  HOME EXERCISE PROGRAM: Access Code: [email or web address] URL: [email or web address] Date: 10/28/2021 Prepared by: Mickie Bail Plaster  Exercises - Seated Hamstring Stretch  - 1 x daily - 5 x weekly - 1 sets - 3 reps - 15 sec hold - Standing on old pillows in corner with eyes open and feet close together  - 1 x daily - 7 x weekly - 1 sets - 4 reps -  30-45 second hold - Standing in corner on old pillows with head turns   - 1 x daily - 7 x weekly - 1 sets - 4 reps - 30-45 second hold - Standing on foam in corner with eyes closed   - 1 x daily - 7 x weekly - 1 sets - 4 reps - 15-30 second hold - Sit to Stand Without Arm Support  - 1 x daily - 7 x weekly - 3 sets - 10 reps  At countertop - lateral weight shifting with UE reach and lifting up contralateral leg for SLS     GOALS: Goals reviewed with patient? Yes  SHORT TERM GOALS: Target date: 11/12/2021  Pt will improve gait velocity to at least 2.8 ft/s w/LRAD for improved gait efficiency   Baseline: 2.55 ft/s without AD; 2.58ft/s or .24m/s with no AD Goal status: MET  2.  MiniBest to be performed and LTG written Baseline: 15/28 on 9/6 Goal status: MET  3.  Pt will improve 5 x STS to less than or equal to 18 seconds without UE support to demonstrate improved functional strength and transfer efficiency.   Baseline: 21.66s without UE support; 17s no UE Goal status: MET  4. Pt will trial various assistive devices to determine safest option for functional mobility to improve independence   Baseline: Pt not wanting to use AD; trialed but didn't like  Goal Status: MET   LONG TERM GOALS: Target date: 12/03/2021  Pt will be independent with final HEP for improved strength, balance, transfers and gait.  Baseline:  Goal status: INITIAL  2.  Pt will improve MiniBest to 20/28 for decreased fall risk and improvement with compensatory stepping strategies.   Baseline: 15/28 Goal status: INITIAL  3.  Pt will verbalize fall prevention strategies in the home for fall risk reduction and safety at home  Baseline:  Goal status: INITIAL  4.  Pt will improve gait velocity to at least 3.0 ft/s with LRAD for improved gait efficiency and independence  Baseline: 2.55 ft/s without AD Goal status: INITIAL  5.  Pt will improve 5 x STS to less than or equal to 15 seconds without UE support to  demonstrate improved functional strength and transfer efficiency.   Baseline: 21.66s without UE support  Goal status: INITIAL   ASSESSMENT:  CLINICAL IMPRESSION: Today's skilled PT session continued to focus on dynamic standing balance, ankle strategy, and anterior weight shifting. Pt challenged by anterior weight shifting tasks while standing on rockerboard, with tendency to have weight back in her heels and lose balance posteriorly. Pt reporting having difficulty getting up from lower surfaces like her sofa, simulated her sofa in clinic with pt doing well with using momentum and counting to 3. Will continue towards LTGs.     OBJECTIVE IMPAIRMENTS Abnormal gait, decreased activity tolerance, decreased balance, decreased knowledge of use of DME, decreased mobility, difficulty walking, decreased safety awareness, and pain.   ACTIVITY LIMITATIONS carrying, lifting, bending, squatting, stairs, transfers, reach over head, and locomotion level  PARTICIPATION LIMITATIONS: shopping, community activity, and yard work  [place] Age,  Fitness, and 1 comorbidity: chronic bilateral knee and lumbar pain  are also affecting patient's functional outcome.   REHAB POTENTIAL: Good  CLINICAL DECISION MAKING: Evolving/moderate complexity  EVALUATION COMPLEXITY: Moderate  PLAN: PT FREQUENCY: 2x/week  PT DURATION: 6 weeks  PLANNED INTERVENTIONS: Therapeutic exercises, Therapeutic activity, Neuromuscular re-education, Balance training, Gait training, Patient/Family education, Self Care, Stair training, DME instructions, Aquatic Therapy, Manual therapy, and Re-evaluation  PLAN FOR NEXT SESSION:   Sit<>stands on rockerboard. Practice rollator and going around obstacles, continue to try [facility] to see if it would be safe for shorter household distances? (Not sure about this one). Intro dual-tasking, Curb negotiation, retropulsion correction, balance on unlevel surfaces (especially w/EC), postural  exercises, scifit    Arliss Journey, PT, DPT 11/18/2021, 12:45 PM

## 2021-11-20 ENCOUNTER — Encounter: Payer: Medicare Other | Admitting: Physical Medicine and Rehabilitation

## 2021-11-23 ENCOUNTER — Ambulatory Visit: Payer: Medicare Other | Attending: Diagnostic Neuroimaging | Admitting: Physical Therapy

## 2021-11-23 DIAGNOSIS — R2689 Other abnormalities of gait and mobility: Secondary | ICD-10-CM | POA: Insufficient documentation

## 2021-11-23 DIAGNOSIS — R413 Other amnesia: Secondary | ICD-10-CM | POA: Insufficient documentation

## 2021-11-23 DIAGNOSIS — R490 Dysphonia: Secondary | ICD-10-CM | POA: Diagnosis not present

## 2021-11-23 DIAGNOSIS — R2681 Unsteadiness on feet: Secondary | ICD-10-CM | POA: Diagnosis not present

## 2021-11-23 DIAGNOSIS — R278 Other lack of coordination: Secondary | ICD-10-CM | POA: Diagnosis not present

## 2021-11-23 DIAGNOSIS — R1310 Dysphagia, unspecified: Secondary | ICD-10-CM | POA: Insufficient documentation

## 2021-11-23 DIAGNOSIS — R293 Abnormal posture: Secondary | ICD-10-CM | POA: Insufficient documentation

## 2021-11-23 NOTE — Therapy (Signed)
OUTPATIENT PHYSICAL THERAPY NEURO TREATMENT   Patient Name: April Church MRN: [id number] DOB:07/16/1940, 81 y.o., female Today's Date: 11/23/2021   PCP: Leeroy Cha, MD REFERRING PROVIDER: Penni Bombard, MD    PT End of Session - 11/23/21 1105     Visit Number 8    Number of Visits 13    Date for PT Re-Evaluation 12/10/21    Authorization Type Medicare A & B    Progress Note Due on Visit 10    PT Start Time 1104    PT Stop Time 1141    PT Time Calculation (min) 37 min    Equipment Utilized During Treatment Gait belt    Activity Tolerance Patient tolerated treatment well    Behavior During Therapy WFL for tasks assessed/performed                Past Medical History:  Diagnosis Date   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 2011- no further tx. now- [name] , [name]   Cyst of finger 11/2011   annular cyst right long finger   Dental crowns present    Dermatitis    Frequency of urination    GERD (gastroesophageal reflux disease)    Hemorrhoid    History of colon polyps 2009   Also noted 2012 and 2017 by colonoscopy.   History of shingles 1971   Had right ischial recurrence in the March 2019   Hyperlipidemia    On atorvastatin   Hypertension    under control, has been on med. x 2 yrs.   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 Leta Baptist), neurosurgeon Dr. Arnoldo Morale   PONV (postoperative nausea and vomiting)    Seasonal allergies    Shingles of eyelid 1971   Right eyelid; recurrent -> last episode 05/11/2017   Tremors of nervous system    hands-essential tremor; associated with Parkinson's.   Trigger finger of right hand 11/2011   long finger   Past Surgical History:  Procedure Laterality Date   14-day Zio Patch Monitor   08/2020   (report to be scanned): Predominant SR w/ HR 61 to 142 bpm and average 86 bpm.  Total of 59 episodes of PAT/PSVT  4 to 15 beats (not noted on diary).  Fastest was 5 beats at a rate of 193 bpm, longest was 15 beats at a rate of 106 bpm.  Rare isolated PACs (as well as couplets and triplets) with rare isolated PVCs.  No sustained arrhythmias or bradycardia to explain syncope.   ANTERIOR CERVICAL DECOMPRESSION/DISCECTOMY FUSION 4 LEVELS N/A 11/14/2013   Procedure: ANTERIOR CERVICAL DECOMPRESSION/DISCECTOMY FUSION 4 LEVELS;  Surgeon: Newman Pies, MD;  Location: [facility];  Service: Neurosurgery;  Laterality: N/A;  C34 C45 C56 C67 anterior cervical fusion with interbody prosthesis plating and  bonegraft   APPENDECTOMY  age 70   BREAST LUMPECTOMY  06/16/2009   left; SLN bx.   BREAST LUMPECTOMY  07/01/2009   re-excision   BREAST SURGERY  02/22/1997   reduction   CATARACT EXTRACTION, BILATERAL     COLONOSCOPY WITH PROPOFOL N/A 12/03/2014   Procedure: COLONOSCOPY WITH PROPOFOL;  Surgeon: April Fair, MD;  Location: [facility];  Service: Endoscopy;  Laterality: N/A;   FOOT SURGERY  06/22/2011  left   KNEE ARTHROSCOPY  03/12/2005   right   KNEE ARTHROSCOPY     left   Lower Extremity Venous Dopplers  12/12/2020   No DVT bilaterally in the deep veins or superficial veins.  No deep or superficial venous reflux noted bilaterally with exception of Right SSV at the knee.   NASAL SINUS SURGERY     x 2   NEPHRECTOMY LIVING DONOR Left 04/22/2008   donated to spouse 2010([facility])   TRANSTHORACIC ECHOCARDIOGRAM  12/10/2020   EF 60 to 65%.  Normal LV size and function.  No heart WMA.  GR 1 DD.  Normal RV, RVP and RAP.April Church  Normal valves.   TRIGGER FINGER RELEASE  12/16/2011   Procedure: RELEASE TRIGGER FINGER/A-1 PULLEY;  Surgeon: Tennis Must, MD;  Location: [facility];  Service: Orthopedics;  Laterality: Right;  RIGHT LONG FINGER TRIGGER RELEASE & ANNULAR CYST EXCISION    TUMOR EXCISION  age 60   right arm   Patient Active Problem List   Diagnosis Date Noted   Moderate persistent asthma without complication 94/80/1655   PSVT (paroxysmal supraventricular tachycardia) 11/26/2020   Syncope and collapse 11/24/2020   Primary osteoarthritis of right knee 05/12/2020   Primary osteoarthritis of left knee 05/12/2020   Hyperlipidemia    Bilateral calf pain 03/05/2019   Genetic testing 02/13/2019   Family history of ovarian cancer    Family history of bladder cancer    Family history of colon cancer    Family history of leukemia    Lumbar radiculopathy 12/08/2018   Myofascial pain 12/08/2018   Impaired gait and mobility 12/08/2018   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   Chronic nonallergic rhinitis 12/29/2016   Mild persistent asthma, uncomplicated 37/48/2707   Vocal fold paralysis, 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   Gastroesophageal reflux disease 12/29/2016   Cervical spondylosis with myelopathy and radiculopathy 11/14/2013   Essential tremor 06/13/2013   Cervical spondylosis without myelopathy 06/13/2013   Breast cancer of lower-outer quadrant of left female breast (April Church) 09/10/2010    ONSET DATE: 10/13/2021   REFERRING DIAG: G20 (ICD-10-CM) - Parkinson's disease ([place]) R26.9 (ICD-10-CM) - Gait difficulty   THERAPY DIAG:  Other abnormalities of gait and mobility  Abnormal posture  Unsteadiness on feet  Rationale for Evaluation and Treatment Rehabilitation  SUBJECTIVE:                                                                                                                                                                                              SUBJECTIVE STATEMENT: Pt reports having good weekend, was very active. No new falls or near misses. Mainly walking  for exercise. 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    PAIN:  Are you having pain? Yes: NPRS scale: 1/10 Pain location: Bilateral calves Pain description: Achy/cramping Pt reports she frequently has pain in both knees, receives injections in B knees every 22mo and states the injections are effective for about 44mo. Is due for next round of injections in mid-October  PRECAUTIONS: Fall  FALLS: Has patient fallen in last 6 months? No  PLOF: Independent  PATIENT GOALS "Getting around better" "Being able to stay independent for as long as I can"   OBJECTIVE:   COGNITION: Overall cognitive status: Within functional limits for tasks assessed Spasmodic Dysphonia    POSTURE: rounded shoulders, forward head, posterior pelvic tilt, and weight shift right   TODAY'S TREATMENT: NMR In // bars for improved reactive balance strategies, BLE coordination, single leg stability and postural control:  -Standing on rocker board in A/P direction without UE support, alt cone taps w/CGA-Min A for retropulsion correction. Noted increased difficulty tapping w/LLE compared to RLE but pt able to maintain balance well throughout utilizing ankle strategy.  -Sidestepping on foam beam, down and back x2 without UE support. Noted poor R foot placement throughout, resulting in posterior LOB. Min A provided for posterior LOB correction and min cues for proper foot placement on foam. Progressed to double cone taps while sidestepping on beam, x2 reps, for added single leg stability and pt had significant difficulty tapping cones w/RLE. Min-mod A throughout for stability   -Anterior weight shifts off the wall, x10 reps w/scapulas touching wall initially and progressed to scapulas and glutes touching wall x10 reps. Pt w/increased difficulty performing when scapulas and glutes against wall. -Sit <>stands x5 w/blue wedge under B feet and no UE support to create posterior bias for improved anterior weight shifting and postural control. Pt had significant difficulty performing task, requiring min A to stabilize in standing and mod A for eccentric control on stand <>sit.      PATIENT EDUCATION: Education details: Reiterated PT trying to send in order for rollator (fax has not been working to [employer]), initiated DC conversation  Person educated: Patient Education method: Explanation Education comprehension: verbalized understanding   HOME EXERCISE PROGRAM: Access Code: [email or web address] URL: [email or web address] Date: 10/28/2021 Prepared by: Mickie Bail Deston Bilyeu  Exercises - Seated Hamstring Stretch  - 1 x daily - 5 x weekly - 1 sets - 3 reps - 15 sec hold - Standing on old pillows in corner with eyes open and feet close together  - 1 x daily - 7 x weekly - 1 sets - 4 reps - 30-45 second hold - Standing in corner on old pillows with head turns   - 1 x daily - 7 x weekly - 1 sets - 4 reps - 30-45 second hold - Standing on foam in corner with eyes closed   - 1 x daily - 7 x weekly - 1 sets - 4 reps - 15-30 second hold - Sit to Stand Without Arm Support  - 1 x daily - 7 x weekly - 3 sets - 10  reps  At countertop - lateral weight shifting with UE reach and lifting up contralateral leg for SLS     GOALS: Goals reviewed with patient? Yes  SHORT TERM GOALS: Target date: 11/12/2021  Pt will improve gait velocity to at least 2.8 ft/s w/LRAD for improved gait efficiency   Baseline: 2.55 ft/s without AD; 2.43ft/s or .25m/s with no AD Goal status: MET  2.  MiniBest to be performed and LTG written Baseline: 15/28 on 9/6 Goal status: MET  3.  Pt will improve 5 x STS  to less than or equal to 18 seconds without UE support to demonstrate improved functional strength and transfer efficiency.   Baseline: 21.66s without UE support; 17s no UE Goal status: MET  4. Pt will trial various assistive devices to determine safest option for functional mobility to improve independence   Baseline: Pt not wanting to use AD; trialed but didn't like  Goal Status: MET   LONG TERM GOALS: Target date: 12/03/2021  Pt will be independent with final HEP for improved strength, balance, transfers and gait.  Baseline:  Goal status: INITIAL  2.  Pt will improve MiniBest to 20/28 for decreased fall risk and improvement with compensatory stepping strategies.   Baseline: 15/28 Goal status: INITIAL  3.  Pt will verbalize fall prevention strategies in the home for fall risk reduction and safety at home  Baseline:  Goal status: INITIAL  4.  Pt will improve gait velocity to at least 3.0 ft/s with LRAD for improved gait efficiency and independence  Baseline: 2.55 ft/s without AD Goal status: INITIAL  5.  Pt will improve 5 x STS to less than or equal to 15 seconds without UE support to demonstrate improved functional strength and transfer efficiency.   Baseline: 21.66s without UE support  Goal status: INITIAL   ASSESSMENT:  CLINICAL IMPRESSION: Emphasis of skilled PT session on reactive balance strategies and anterior weight shifting. Pt continues to exhibit posterior lean preference, resulting in frequent LOB posteriorly when on uneven surfaces. Pt still hesitant to use rollator despite encouragement from therapist. Pt in agreement to DC from PT next week due to improvements in balance and confidence w/gait. Pt did not receive injections to her knees last week, rescheduled for 10/15. Continue POC.   OBJECTIVE IMPAIRMENTS Abnormal gait, decreased activity tolerance, decreased balance, decreased knowledge of use of DME, decreased mobility, difficulty walking, decreased safety  awareness, and pain.   ACTIVITY LIMITATIONS carrying, lifting, bending, squatting, stairs, transfers, reach over head, and locomotion level  PARTICIPATION LIMITATIONS: shopping, community activity, and yard work  [place] Age, Fitness, and 1 comorbidity: chronic bilateral knee and lumbar pain  are also affecting patient's functional outcome.   REHAB POTENTIAL: Good  CLINICAL DECISION MAKING: Evolving/moderate complexity  EVALUATION COMPLEXITY: Moderate  PLAN: PT FREQUENCY: 2x/week  PT DURATION: 6 weeks  PLANNED INTERVENTIONS: Therapeutic exercises, Therapeutic activity, Neuromuscular re-education, Balance training, Gait training, Patient/Family education, Self Care, Stair training, DME instructions, Aquatic Therapy, Manual therapy, and Re-evaluation  PLAN FOR NEXT SESSION:   Sit<>stands on rockerboard/wedge. Practice rollator and going around obstacles, continue to try [facility] to see if it would be safe for shorter household distances? (Not sure about this one). Intro dual-tasking, Curb negotiation, retropulsion correction, balance on unlevel surfaces (especially w/EC), postural exercises, scifit    Eleisha Branscomb E Lynnix Schoneman, PT, DPT 11/23/2021, 11:42 AM

## 2021-11-25 ENCOUNTER — Ambulatory Visit: Payer: Medicare Other | Admitting: Physical Therapy

## 2021-11-25 DIAGNOSIS — R2689 Other abnormalities of gait and mobility: Secondary | ICD-10-CM

## 2021-11-25 DIAGNOSIS — R1310 Dysphagia, unspecified: Secondary | ICD-10-CM | POA: Diagnosis not present

## 2021-11-25 DIAGNOSIS — R2681 Unsteadiness on feet: Secondary | ICD-10-CM | POA: Diagnosis not present

## 2021-11-25 DIAGNOSIS — R413 Other amnesia: Secondary | ICD-10-CM | POA: Diagnosis not present

## 2021-11-25 DIAGNOSIS — R293 Abnormal posture: Secondary | ICD-10-CM | POA: Diagnosis not present

## 2021-11-25 DIAGNOSIS — R278 Other lack of coordination: Secondary | ICD-10-CM

## 2021-11-25 DIAGNOSIS — R490 Dysphonia: Secondary | ICD-10-CM | POA: Diagnosis not present

## 2021-11-25 NOTE — Therapy (Signed)
OUTPATIENT PHYSICAL THERAPY NEURO TREATMENT   Patient Name: April Church MRN: [id number] DOB:December 17, 1940, 81 y.o., female Today's Date: 11/25/2021   PCP: Leeroy Cha, MD REFERRING PROVIDER: Penni Bombard, MD    PT End of Session - 11/25/21 1106     Visit Number 9    Number of Visits 13    Date for PT Re-Evaluation 12/10/21    Authorization Type Medicare A & B    Progress Note Due on Visit 10    PT Start Time 1103    PT Stop Time 1145    PT Time Calculation (min) 42 min    Equipment Utilized During Treatment Gait belt    Activity Tolerance Patient tolerated treatment well    Behavior During Therapy WFL for tasks assessed/performed                 Past Medical History:  Diagnosis Date   Acquired solitary kidney 04/2008   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 2011- no further tx. now- [name] , [name]   Cyst of finger 11/2011   annular cyst right long finger   Dental crowns present    Dermatitis    Frequency of urination    GERD (gastroesophageal reflux disease)    Hemorrhoid    History of colon polyps 2009   Also noted 2012 and 2017 by colonoscopy.   History of shingles 1971   Had right ischial recurrence in the March 2019   Hyperlipidemia    On atorvastatin   Hypertension    under control, has been on med. x 2 yrs.   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 Leta Baptist), neurosurgeon Dr. Arnoldo Morale   PONV (postoperative nausea and vomiting)    Seasonal allergies    Shingles of eyelid 1971   Right eyelid; recurrent -> last episode 05/11/2017   Tremors of nervous system    hands-essential tremor; associated with Parkinson's.   Trigger finger of right hand 11/2011   long finger   Past Surgical History:  Procedure Laterality Date   14-day Zio Patch  Monitor  08/2020   (report to be scanned): Predominant SR w/ HR 61 to 142 bpm and average 86 bpm.  Total of 59 episodes of PAT/PSVT  4 to 15 beats (not noted on diary).  Fastest was 5 beats at a rate of 193 bpm, longest was 15 beats at a rate of 106 bpm.  Rare isolated PACs (as well as couplets and triplets) with rare isolated PVCs.  No sustained arrhythmias or bradycardia to explain syncope.   ANTERIOR CERVICAL DECOMPRESSION/DISCECTOMY FUSION 4 LEVELS N/A 11/14/2013   Procedure: ANTERIOR CERVICAL DECOMPRESSION/DISCECTOMY FUSION 4 LEVELS;  Surgeon: Newman Pies, MD;  Location: [facility];  Service: Neurosurgery;  Laterality: N/A;  C34 C45 C56 C67 anterior cervical fusion with interbody prosthesis plating and  bonegraft   APPENDECTOMY  age 22   BREAST LUMPECTOMY  06/16/2009   left; SLN bx.   BREAST LUMPECTOMY  07/01/2009   re-excision   BREAST SURGERY  02/22/1997   reduction   CATARACT EXTRACTION, BILATERAL     COLONOSCOPY WITH PROPOFOL N/A 12/03/2014   Procedure: COLONOSCOPY WITH PROPOFOL;  Surgeon: [name], MD;  Location: [facility];  Service: Endoscopy;  Laterality: N/A;   FOOT SURGERY  06/22/2011  left   KNEE ARTHROSCOPY  03/12/2005   right   KNEE ARTHROSCOPY     left   Lower Extremity Venous Dopplers  12/12/2020   No DVT bilaterally in the deep veins or superficial veins.  No deep or superficial venous reflux noted bilaterally with exception of Right SSV at the knee.   NASAL SINUS SURGERY     x 2   NEPHRECTOMY LIVING DONOR Left 04/22/2008   donated to spouse 2010([facility])   TRANSTHORACIC ECHOCARDIOGRAM  12/10/2020   EF 60 to 65%.  Normal LV size and function.  No heart WMA.  GR 1 DD.  Normal RV, RVP and RAP.[name]  Normal valves.   TRIGGER FINGER RELEASE  12/16/2011   Procedure: RELEASE TRIGGER FINGER/A-1 PULLEY;  Surgeon: Tennis Must, MD;  Location: [facility];  Service: Orthopedics;  Laterality: Right;  RIGHT LONG FINGER TRIGGER RELEASE & ANNULAR CYST  EXCISION   TUMOR EXCISION  age 11   right arm   Patient Active Problem List   Diagnosis Date Noted   Moderate persistent asthma without complication 74/25/9563   PSVT (paroxysmal supraventricular tachycardia) 11/26/2020   Syncope and collapse 11/24/2020   Primary osteoarthritis of right knee 05/12/2020   Primary osteoarthritis of left knee 05/12/2020   Hyperlipidemia    Bilateral calf pain 03/05/2019   Genetic testing 02/13/2019   Family history of ovarian cancer    Family history of bladder cancer    Family history of colon cancer    Family history of leukemia    Lumbar radiculopathy 12/08/2018   Myofascial pain 12/08/2018   Impaired gait and mobility 12/08/2018   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   Chronic nonallergic rhinitis 12/29/2016   Mild persistent asthma, uncomplicated 87/56/4332   Vocal fold paralysis, 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   Gastroesophageal reflux disease 12/29/2016   Cervical spondylosis with myelopathy and radiculopathy 11/14/2013   Essential tremor 06/13/2013   Cervical spondylosis without myelopathy 06/13/2013   Breast cancer of lower-outer quadrant of left female breast ([place]) 09/10/2010    ONSET DATE: 10/13/2021   REFERRING DIAG: G20 (ICD-10-CM) - Parkinson's disease ([place]) R26.9 (ICD-10-CM) - Gait difficulty   THERAPY DIAG:  Unsteadiness on feet  Other lack of coordination  Other abnormalities of gait and mobility  Rationale for Evaluation and Treatment Rehabilitation  SUBJECTIVE:                                                                                                                                                                                              SUBJECTIVE STATEMENT: Pt reports she is doing well. "I am with you two days in a  row, huh?" No new changes to report. Feels as though her voice is finally starting to come back.   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    PAIN:  Are you having pain? Yes: NPRS scale: 2/10 Pain location: R knee Pain description: Achy/cramping Pt reports she frequently has pain in both knees, receives injections in B knees every 30moand states the injections are effective for about 253moIs due for next round of injections in mid-October  PRECAUTIONS: Fall  FALLS: Has patient fallen in last 6 months? No  PLOF: Independent  PATIENT GOALS "Getting around better" "Being able to stay independent for as long as I can"   OBJECTIVE:   COGNITION: Overall cognitive status: Within functional limits for tasks assessed Spasmodic Dysphonia    POSTURE: rounded shoulders, forward head, posterior pelvic tilt, and weight shift right   TODAY'S TREATMENT: Ther Ex  SciFit multi-peaks level 7 for 8 minutes using BUE/BLEs for neural priming for reciprocal movement, dynamic cardiovascular warmup and increased amplitude of stepping. RPE of 5/10 following activity   NMR In // bars for improved reactive balance strategies, BLE coordination, step length and postural control:  Alt step over 4" beam to colored dot target without UE support, x8 per side. Noted lateral lean to R side and forward flexed posture throughout. Pt w/increased difficulty stepping w/RLE > LLE. Progressed to adding cog dual-task (naming veggies w/L foot and fruit w/R). Pt able to maintain alt step sequence w/added cog challenge but required extra time to initiate movement and was unable to name correct item w/step  ~25% of the time.  Standing on thick blue foam, Cornhole x3 games using beanbags. On first game, placed basket of beanbags anterolaterally to pt on right side and had her throw w/RUE. On second game, placed basket posterolaterally to L side and had pt reach/throw w/RUE. On final game, placed basket posterolaterally on R side and had pt reach/throw w/LUE. Noted most difficulty throwing/reaching w/LUE w/frequent posterolateral LOB to R side, requiring min A to stabilize. Also noted when pt throwing w/RUE, she does not reach for rail to regain her balance w/LUE. However, when throwing w/LUE, pt braced against rail on R side despite cues to reduce lean to R side.    Walking indoors holding surge (15#) [phone] dynamic balance challenge and postural control. Noted significant truncal lean to R side throughout despite cues to shift to L. Pt maintained R elbow flexion throughout despite cues for elbow extension. CGA throughout.   PATIENT EDUCATION: Education details: Plan for next week, continue HEP  Person educated: Patient Education method: Explanation Education comprehension: verbalized understanding   HOME EXERCISE PROGRAM: Access Code: [email or web address] URL: [email or web address] Date: 10/28/2021 Prepared by: JaMickie Baillaster  Exercises - Seated Hamstring Stretch  - 1 x daily - 5 x weekly - 1 sets - 3 reps - 15 sec hold - Standing on old pillows in corner with eyes open and feet close together  - 1 x daily - 7 x weekly - 1 sets - 4 reps - 30-45 second hold - Standing in corner on old pillows with  head turns   - 1 x daily - 7 x weekly - 1 sets - 4 reps - 30-45 second hold - Standing on foam in corner with eyes closed   - 1 x daily - 7 x weekly - 1 sets - 4 reps - 15-30 second hold - Sit to Stand Without Arm Support  - 1 x daily - 7 x weekly - 3 sets - 10 reps  At countertop - lateral weight shifting with UE reach and lifting up contralateral leg for SLS     GOALS: Goals reviewed with  patient? Yes  SHORT TERM GOALS: Target date: 11/12/2021  Pt will improve gait velocity to at least 2.8 ft/s w/LRAD for improved gait efficiency   Baseline: 2.55 ft/s without AD; 2.48f/s or .811m with no AD Goal status: MET  2.  MiniBest to be performed and LTG written Baseline: 15/28 on 9/6 Goal status: MET  3.  Pt will improve 5 x STS to less than or equal to 18 seconds without UE support to demonstrate improved functional strength and transfer efficiency.   Baseline: 21.66s without UE support; 17s no UE Goal status: MET  4. Pt will trial various assistive devices to determine safest option for functional mobility to improve independence   Baseline: Pt not wanting to use AD; trialed but didn't like  Goal Status: MET   LONG TERM GOALS: Target date: 12/03/2021  Pt will be independent with final HEP for improved strength, balance, transfers and gait.  Baseline:  Goal status: INITIAL  2.  Pt will improve MiniBest to 20/28 for decreased fall risk and improvement with compensatory stepping strategies.   Baseline: 15/28 Goal status: INITIAL  3.  Pt will verbalize fall prevention strategies in the home for fall risk reduction and safety at home  Baseline:  Goal status: INITIAL  4.  Pt will improve gait velocity to at least 3.0 ft/s with LRAD for improved gait efficiency and independence  Baseline: 2.55 ft/s without AD Goal status: INITIAL  5.  Pt will improve 5 x STS to less than or equal to 15 seconds without UE support to demonstrate improved functional strength and transfer efficiency.   Baseline: 21.66s without UE support  Goal status: INITIAL   ASSESSMENT:  CLINICAL IMPRESSION: Emphasis of skilled PT session on postural control, reactive balance strategies and dual-tasking. Pt continues to demonstrate significant anterolateral lean to R side w/gait and dynamic activities despite multimodal cues. Pt challenged by addition of cog dual-task, requiring extra initiation  time to perform motor task. Pt in agreement to DC next week and therapist will continue to try to send rollator order to [employer]. Continue POC.   OBJECTIVE IMPAIRMENTS Abnormal gait, decreased activity tolerance, decreased balance, decreased knowledge of use of DME, decreased mobility, difficulty walking, decreased safety awareness, and pain.   ACTIVITY LIMITATIONS carrying, lifting, bending, squatting, stairs, transfers, reach over head, and locomotion level  PARTICIPATION LIMITATIONS: shopping, community activity, and yard work  PECambriage, Fitness, and 1 comorbidity: chronic bilateral knee and lumbar pain  are also affecting patient's functional outcome.   REHAB POTENTIAL: Good  CLINICAL DECISION MAKING: Evolving/moderate complexity  EVALUATION COMPLEXITY: Moderate  PLAN: PT FREQUENCY: 2x/week  PT DURATION: 6 weeks  PLANNED INTERVENTIONS: Therapeutic exercises, Therapeutic activity, Neuromuscular re-education, Balance training, Gait training, Patient/Family education, Self Care, Stair training, DME instructions, Aquatic Therapy, Manual therapy, and Re-evaluation  PLAN FOR NEXT SESSION:   10th visit PN, Sit<>stands on rockerboard/wedge. Practice rollator and going around obstacles, continue  to try [facility] to see if it would be safe for shorter household distances? (Not sure about this one). Intro dual-tasking, Curb negotiation, retropulsion correction, balance on unlevel surfaces (especially w/EC), postural exercises, scifit    Jaleisa Brose E Aissata Wilmore, PT, DPT 11/25/2021, 11:54 AM

## 2021-11-26 DIAGNOSIS — D1801 Hemangioma of skin and subcutaneous tissue: Secondary | ICD-10-CM | POA: Diagnosis not present

## 2021-11-26 DIAGNOSIS — L814 Other melanin hyperpigmentation: Secondary | ICD-10-CM | POA: Diagnosis not present

## 2021-11-26 DIAGNOSIS — L821 Other seborrheic keratosis: Secondary | ICD-10-CM | POA: Diagnosis not present

## 2021-11-26 DIAGNOSIS — D2262 Melanocytic nevi of left upper limb, including shoulder: Secondary | ICD-10-CM | POA: Diagnosis not present

## 2021-11-30 ENCOUNTER — Ambulatory Visit: Payer: Medicare Other | Admitting: Physical Therapy

## 2021-11-30 DIAGNOSIS — R2681 Unsteadiness on feet: Secondary | ICD-10-CM | POA: Diagnosis not present

## 2021-11-30 DIAGNOSIS — R1310 Dysphagia, unspecified: Secondary | ICD-10-CM | POA: Diagnosis not present

## 2021-11-30 DIAGNOSIS — R2689 Other abnormalities of gait and mobility: Secondary | ICD-10-CM | POA: Diagnosis not present

## 2021-11-30 DIAGNOSIS — R293 Abnormal posture: Secondary | ICD-10-CM

## 2021-11-30 DIAGNOSIS — R490 Dysphonia: Secondary | ICD-10-CM | POA: Diagnosis not present

## 2021-11-30 DIAGNOSIS — R413 Other amnesia: Secondary | ICD-10-CM | POA: Diagnosis not present

## 2021-11-30 NOTE — Therapy (Signed)
OUTPATIENT PHYSICAL THERAPY NEURO TREATMENT- 10TH VISIT PROGRESS NOTE    Patient Name: April Church MRN: [id number] DOB:[birth year], 81 y.o., female Today's Date: 11/30/2021   PCP: April Cha, MD REFERRING PROVIDER: Penni Bombard, MD   Physical Therapy Progress Note   Dates of Reporting Period:10/22/21 - 11/30/21  See Note below for Objective Data and Assessment of Progress/Goals.  Thank you for the referral of this patient. 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 2011- no further tx. now- Dr. Truddie Church , Dr. Valere Church   Cyst of finger 11/2011   annular cyst right long finger   Dental crowns present    Dermatitis    Frequency of urination    GERD (gastroesophageal reflux disease)    Hemorrhoid    History of colon polyps 2009   Also noted 2012 and 2017 by colonoscopy.   History of shingles 1971   Had right ischial recurrence in the March 2019   Hyperlipidemia    On atorvastatin   Hypertension    under control, has been on med. x 2 yrs.   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 Leta Church), neurosurgeon Dr. Arnoldo Church   PONV (postoperative nausea and vomiting)    Seasonal allergies    Shingles of eyelid 1971   Right eyelid;  recurrent -> last episode 05/11/2017   Tremors of nervous system    hands-essential tremor; associated with Parkinson's.   Trigger finger of right hand 11/2011   long finger   Past Surgical History:  Procedure Laterality Date   14-day Zio Patch Monitor  08/2020   (report to be scanned): Predominant SR w/ HR 61 to 142 bpm and average 86 bpm.  Total of 59 episodes of PAT/PSVT  4 to 15 beats (not noted on diary).  Fastest was 5 beats at a rate of 193 bpm, longest was 15 beats at a rate of 106 bpm.  Rare isolated PACs (as well as couplets and triplets) with rare isolated PVCs.  No sustained arrhythmias or bradycardia to explain syncope.   ANTERIOR CERVICAL DECOMPRESSION/DISCECTOMY FUSION 4 LEVELS N/A 11/14/2013   Procedure: ANTERIOR CERVICAL DECOMPRESSION/DISCECTOMY FUSION 4 LEVELS;  Surgeon: April Pies, MD;  Location: [facility];  Service: Neurosurgery;  Laterality: N/A;  C34 C45 C56 C67 anterior cervical fusion with interbody prosthesis plating and  bonegraft   APPENDECTOMY  age 53   BREAST LUMPECTOMY  06/16/2009   left; SLN bx.   BREAST LUMPECTOMY  07/01/2009   re-excision   BREAST SURGERY  02/22/1997  reduction   CATARACT EXTRACTION, BILATERAL     COLONOSCOPY WITH PROPOFOL N/A 12/03/2014   Procedure: COLONOSCOPY WITH PROPOFOL;  Surgeon: April Fair, MD;  Location: [facility];  Service: Endoscopy;  Laterality: N/A;   FOOT SURGERY  06/22/2011   left   KNEE ARTHROSCOPY  03/12/2005   right   KNEE ARTHROSCOPY     left   Lower Extremity Venous Dopplers  12/12/2020   No DVT bilaterally in the deep veins or superficial veins.  No deep or superficial venous reflux noted bilaterally with exception of Right SSV at the knee.   NASAL SINUS SURGERY     x 2   NEPHRECTOMY LIVING DONOR Left 04/22/2008   donated to spouse 2010(Church)   TRANSTHORACIC ECHOCARDIOGRAM  12/10/2020   EF 60 to 65%.  Normal LV size and function.  No heart WMA.  GR 1 DD.  Normal RV, RVP and RAP.[name]  Normal  valves.   TRIGGER FINGER RELEASE  12/16/2011   Procedure: RELEASE TRIGGER FINGER/A-1 PULLEY;  Surgeon: April Must, MD;  Location: [facility];  Service: Orthopedics;  Laterality: Right;  RIGHT LONG FINGER TRIGGER RELEASE & ANNULAR CYST EXCISION   TUMOR EXCISION  age 32   right arm   Patient Active Problem List   Diagnosis Date Noted   Moderate persistent asthma without complication 15/83/0940   PSVT (paroxysmal supraventricular tachycardia) 11/26/2020   Syncope and collapse 11/24/2020   Primary osteoarthritis of right knee 05/12/2020   Primary osteoarthritis of left knee 05/12/2020   Hyperlipidemia    Bilateral calf pain 03/05/2019   Genetic testing 02/13/2019   Family history of ovarian cancer    Family history of bladder cancer    Family history of colon cancer    Family history of leukemia    Lumbar radiculopathy 12/08/2018   Myofascial pain 12/08/2018   Impaired gait and mobility 12/08/2018   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   Chronic nonallergic rhinitis 12/29/2016   Mild persistent asthma, uncomplicated 76/80/8811   Vocal fold paralysis, 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   Gastroesophageal reflux disease 12/29/2016   Cervical spondylosis with myelopathy and radiculopathy 11/14/2013   Essential tremor 06/13/2013   Cervical spondylosis without myelopathy 06/13/2013   Breast cancer of lower-outer quadrant of left female breast ([place]) 09/10/2010    ONSET DATE: 10/13/2021   REFERRING DIAG: G20 (ICD-10-CM) - Parkinson's disease ([place]) R26.9 (ICD-10-CM) - Gait difficulty   THERAPY DIAG:  Unsteadiness on feet  Abnormal posture  Other abnormalities of gait and mobility  Rationale for Evaluation and Treatment Rehabilitation  SUBJECTIVE:  SUBJECTIVE STATEMENT: Pt reports she is doing  well. Has not heard from adapt and primary therapist not in office today. No new falls or changes.   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    PAIN:  Are you having pain? No Pt reports she frequently has pain in both knees, receives injections in B knees every 110moand states the injections are effective for about 24moIs due for next round of injections in mid-October  PRECAUTIONS: Fall  FALLS: Has patient fallen in last 6 months? No  PLOF: Independent  PATIENT GOALS "Getting around better" "Being able to stay independent for as long as I can"   OBJECTIVE:   COGNITION: Overall cognitive status: Within functional limits for tasks assessed Spasmodic Dysphonia    POSTURE: rounded shoulders, forward head, posterior pelvic tilt, and weight shift right   TODAY'S TREATMENT: Ther Act  Discussed fall prevention techniques at home and in community. Pt reports she has removed her throw rugs in her hallway but has kept the rugs at her doorway as she "has not had any issues with them". Pt reports she is more conscious of the fact that she can lose her balance and she has learned not to carry items with both hands so she can brace herself on the wall or a rail to maintain her balance. Educated pt on removing her throw rugs at her front door for safety and importance of having well-lit areas, especially at night. Pt verbalized understanding.  Continued to encourage pt to use  a rollator in the community for improved confidence w/mobility and overall improved stability. Pt continues to state she "will not commit" to an AD but does agree that she would benefit from one.   Gait Training  Gait pattern: step through pattern, decreased stride length, Right foot flat, Left foot flat, knee flexed in stance- Right, knee flexed in stance- Left, lateral lean- Right, trunk flexed, narrow BOS, and poor foot clearance- Right Distance walked: >1,000' outside on sidewalk and up/down curbs  Assistive device utilized: WaEnvironmental consultant 4 wheeled Level of assistance: Modified independence and SBA Comments: Pt demonstrates good AD management on unlevel surfaces, inclines/declines and even grass, performing mod I. Educated pt on proper technique to ascend/descend curb w/rollator and pt practiced x3, requiring SBA while descending due to premature stepping off curb prior to placing rollator down. No instability noted.    PATIENT EDUCATION: Education details: Continue HEP, plan to DC next session  Person educated: Patient Education method: Explanation Education comprehension: verbalized understanding   HOME EXERCISE PROGRAM: Access Code: [email or web address] URL: [email or web address] Date: 10/28/2021 Prepared by: JaMickie Baillaster  Exercises - Seated Hamstring Stretch  - 1 x daily - 5 x weekly - 1 sets - 3 reps - 15 sec hold - Standing on old pillows in corner with eyes open and feet close together  - 1 x daily - 7 x weekly - 1 sets - 4 reps - 30-45 second hold - Standing in corner on old pillows with head turns   - 1 x daily - 7 x weekly - 1 sets - 4 reps -  30-45 second hold - Standing on foam in corner with eyes closed   - 1 x daily - 7 x weekly - 1 sets - 4 reps - 15-30 second hold - Sit to Stand Without Arm Support  - 1 x daily - 7 x weekly - 3 sets - 10 reps  At countertop - lateral weight shifting with UE reach and lifting up contralateral leg for SLS     GOALS: Goals reviewed  with patient? Yes  SHORT TERM GOALS: Target date: 11/12/2021  Pt will improve gait velocity to at least 2.8 ft/s w/LRAD for improved gait efficiency   Baseline: 2.55 ft/s without AD; 2.63f/s or .8448m with no AD Goal status: MET  2.  MiniBest to be performed and LTG written Baseline: 15/28 on 9/6 Goal status: MET  3.  Pt will improve 5 x STS to less than or equal to 18 seconds without UE support to demonstrate improved functional strength and transfer efficiency.   Baseline: 21.66s without UE support; 17s no UE Goal status: MET  4. Pt will trial various assistive devices to determine safest option for functional mobility to improve independence   Baseline: Pt not wanting to use AD; trialed but didn't like  Goal Status: MET   LONG TERM GOALS: Target date: 12/03/2021  Pt will be independent with final HEP for improved strength, balance, transfers and gait.  Baseline:  Goal status: INITIAL  2.  Pt will improve MiniBest to 20/28 for decreased fall risk and improvement with compensatory stepping strategies.   Baseline: 15/28 Goal status: INITIAL  3.  Pt will verbalize fall prevention strategies in the home for fall risk reduction and safety at home  Baseline:  Goal status: MET  4.  Pt will improve gait velocity to at least 3.0 ft/s with LRAD for improved gait efficiency and independence  Baseline: 2.55 ft/s without AD Goal status: INITIAL  5.  Pt will improve 5 x STS to less than or equal to 15 seconds without UE support to demonstrate improved functional strength and transfer efficiency.   Baseline: 21.66s without UE support  Goal status: INITIAL   ASSESSMENT:  CLINICAL IMPRESSION: Emphasis of skilled PT session on pt education and gait training w/rollator on community surfaces. Pt continues to decline use of AD in community despite verbalizing instability outdoors and feeling more steady w/use of rollator. Pt manages rollator mod I on sidewalk and grass and required  SBA for curb navigation due to needing verbal cues. Pt in agreement to DC from therapy next session. Continue POC.   OBJECTIVE IMPAIRMENTS Abnormal gait, decreased activity tolerance, decreased balance, decreased knowledge of use of DME, decreased mobility, difficulty walking, decreased safety awareness, and pain.   ACTIVITY LIMITATIONS carrying, lifting, bending, squatting, stairs, transfers, reach over head, and locomotion level  PARTICIPATION LIMITATIONS: shopping, community activity, and yard work  [place], Fitness, and 1 comorbidity: chronic bilateral knee and lumbar pain  are also affecting patient's functional outcome.   REHAB POTENTIAL: Good  CLINICAL DECISION MAKING: Evolving/moderate complexity  EVALUATION COMPLEXITY: Moderate  PLAN: PT FREQUENCY: 2x/week  PT DURATION: 6 weeks  PLANNED INTERVENTIONS: Therapeutic exercises, Therapeutic activity, Neuromuscular re-education, Balance training, Gait training, Patient/Family education, Self Care, Stair training, DME instructions, Aquatic Therapy, Manual therapy, and Re-evaluation  PLAN FOR NEXT SESSION:   Check goals and DC, cancel PD screens in December and schedule [id number].   JanCruzita Ledereraster, PT, DPT 11/30/2021, 11:52 AM

## 2021-12-03 ENCOUNTER — Ambulatory Visit: Payer: Medicare Other | Admitting: Physical Therapy

## 2021-12-03 ENCOUNTER — Telehealth: Payer: Self-pay | Admitting: Physical Therapy

## 2021-12-03 DIAGNOSIS — R1310 Dysphagia, unspecified: Secondary | ICD-10-CM

## 2021-12-03 DIAGNOSIS — R293 Abnormal posture: Secondary | ICD-10-CM | POA: Diagnosis not present

## 2021-12-03 DIAGNOSIS — R490 Dysphonia: Secondary | ICD-10-CM

## 2021-12-03 DIAGNOSIS — R413 Other amnesia: Secondary | ICD-10-CM

## 2021-12-03 DIAGNOSIS — R2689 Other abnormalities of gait and mobility: Secondary | ICD-10-CM

## 2021-12-03 DIAGNOSIS — R2681 Unsteadiness on feet: Secondary | ICD-10-CM | POA: Diagnosis not present

## 2021-12-03 NOTE — Telephone Encounter (Signed)
April Church completed her PT treatment today and mentioned she would like a SLP referral to work on her memory and dysphonia. Pt has been receiving laryngeal botox injections and no longer wishes to receive these, would prefer SLP instead  If you agree, please place an order in [facility] workque in [facility] or fax the order to [phone]. Thank you, Francena Hanly, PT, [place] [address] [place], [place]  [address] Phone:  [phone] Fax:  [phone]

## 2021-12-03 NOTE — Therapy (Signed)
OUTPATIENT PHYSICAL THERAPY NEURO TREATMENT- DISCHARGE SUMMARY    Patient Name: April Church MRN: [id number] DOB:July 28, 1940, 81 y.o., female Today's Date: 12/03/2021   PCP: Leeroy Cha, MD REFERRING PROVIDER: Penni Bombard, MD   PHYSICAL THERAPY DISCHARGE SUMMARY  Visits from Start of Care: 11  Current functional level related to goals / functional outcomes: Mod I w/functional mobility. Encourage use of rollator for community ambulation    Remaining deficits: Gait deficits 2/2 PD, impaired working Theme park manager / Equipment: HEP, rollator    Patient agrees to discharge. Patient goals were partially met. Patient is being discharged due to being pleased with the current functional level.      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 2011- no further tx. now- [name] , [name]   Cyst of finger 11/2011   annular cyst right long finger   Dental crowns present    Dermatitis    Frequency of urination    GERD (gastroesophageal reflux disease)    Hemorrhoid    History of colon polyps 2009   Also noted 2012 and 2017 by colonoscopy.   History of shingles 1971   Had right ischial recurrence in the March 2019   Hyperlipidemia    On atorvastatin   Hypertension    under control, has been on med. x 2  yrs.   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 Leta Baptist), neurosurgeon Dr. Arnoldo Morale   PONV (postoperative nausea and vomiting)    Seasonal allergies    Shingles of eyelid 1971   Right eyelid; recurrent -> last episode 05/11/2017   Tremors of nervous system    hands-essential tremor; associated with Parkinson's.   Trigger finger of right hand 11/2011   long finger   Past Surgical History:  Procedure Laterality Date   14-day Zio Patch Monitor  08/2020   (report to be scanned): Predominant SR w/ HR 61 to 142 bpm and average 86 bpm.  Total of 59 episodes of PAT/PSVT  4 to 15 beats (not noted on diary).  Fastest was 5 beats at a rate of 193 bpm, longest was 15 beats at a rate of 106 bpm.  Rare isolated PACs (as well as couplets and triplets) with rare isolated PVCs.  No sustained arrhythmias or bradycardia to explain syncope.   ANTERIOR CERVICAL DECOMPRESSION/DISCECTOMY FUSION 4 LEVELS N/A 11/14/2013   Procedure: ANTERIOR CERVICAL DECOMPRESSION/DISCECTOMY FUSION 4 LEVELS;  Surgeon: Newman Pies, MD;  Location: [facility];  Service: Neurosurgery;  Laterality: N/A;  C34 C45 C56 C67 anterior cervical  fusion with interbody prosthesis plating and  bonegraft   APPENDECTOMY  age 73   BREAST LUMPECTOMY  06/16/2009   left; SLN bx.   BREAST LUMPECTOMY  07/01/2009   re-excision   BREAST SURGERY  02/22/1997   reduction   CATARACT EXTRACTION, BILATERAL     COLONOSCOPY WITH PROPOFOL N/A 12/03/2014   Procedure: COLONOSCOPY WITH PROPOFOL;  Surgeon: [name], MD;  Location: [facility];  Service: Endoscopy;  Laterality: N/A;   FOOT SURGERY  06/22/2011   left   KNEE ARTHROSCOPY  03/12/2005   right   KNEE ARTHROSCOPY     left   Lower Extremity Venous Dopplers  12/12/2020   No DVT bilaterally in the deep veins or superficial veins.  No deep or superficial venous reflux noted bilaterally with exception of Right SSV at the knee.    NASAL SINUS SURGERY     x 2   NEPHRECTOMY LIVING DONOR Left 04/22/2008   donated to spouse 2010([facility])   TRANSTHORACIC ECHOCARDIOGRAM  12/10/2020   EF 60 to 65%.  Normal LV size and function.  No heart WMA.  GR 1 DD.  Normal RV, RVP and RAP.[name]  Normal valves.   TRIGGER FINGER RELEASE  12/16/2011   Procedure: RELEASE TRIGGER FINGER/A-1 PULLEY;  Surgeon: Tennis Must, MD;  Location: [facility];  Service: Orthopedics;  Laterality: Right;  RIGHT LONG FINGER TRIGGER RELEASE & ANNULAR CYST EXCISION   TUMOR EXCISION  age 10   right arm   Patient Active Problem List   Diagnosis Date Noted   Moderate persistent asthma without complication 44/31/5400   PSVT (paroxysmal supraventricular tachycardia) 11/26/2020   Syncope and collapse 11/24/2020   Primary osteoarthritis of right knee 05/12/2020   Primary osteoarthritis of left knee 05/12/2020   Hyperlipidemia    Bilateral calf pain 03/05/2019   Genetic testing 02/13/2019   Family history of ovarian cancer    Family history of bladder cancer    Family history of colon cancer    Family history of leukemia    Lumbar radiculopathy 12/08/2018   Myofascial pain 12/08/2018   Impaired gait and mobility 12/08/2018   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   Chronic nonallergic rhinitis 12/29/2016   Mild persistent asthma, uncomplicated 86/76/1950   Vocal fold paralysis, 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   Gastroesophageal reflux disease 12/29/2016   Cervical spondylosis with myelopathy and radiculopathy 11/14/2013   Essential tremor 06/13/2013   Cervical spondylosis without myelopathy 06/13/2013   Breast cancer of lower-outer quadrant of left female breast ([place]) 09/10/2010    ONSET DATE: 10/13/2021   REFERRING DIAG: G20 (ICD-10-CM) - Parkinson's disease ([place]) R26.9 (ICD-10-CM) - Gait difficulty   THERAPY DIAG:  Unsteadiness on feet  Abnormal posture  Other abnormalities of gait and mobility  Rationale for Evaluation and Treatment  Rehabilitation  SUBJECTIVE:  SUBJECTIVE STATEMENT: Pt reports she obtained her rollator this past Tuesday. Has not used it, it is sitting in her garage. Had a catch in her back this morning but it feels better now. No new falls or changes. Is agreeable to DC today.   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    PAIN:  Are you having pain? No Pt reports she frequently has pain in both knees, receives injections in B knees every 61moand states the injections are effective for about 269moIs due for next round of injections in mid-October  PRECAUTIONS: Fall  FALLS: Has patient fallen in last 6 months? No  PLOF: Independent  PATIENT GOALS "Getting around better" "Being able to stay independent for as long as I can"   OBJECTIVE:   COGNITION: Overall cognitive status: Within functional limits for tasks assessed Spasmodic Dysphonia    POSTURE: rounded shoulders, forward head, posterior pelvic tilt, and weight shift right   TODAY'S TREATMENT: Ther Act  LTG Assessment   [facility] PT Assessment - 12/03/21 1026       Transfers   Five time sit to stand comments  19.68s without UE support      Mini-BESTest   Sit To Stand Normal: Comes to stand without use of hands and stabilizes  independently.    Rise to Toes Moderate: Heels up, but not full range (smaller than when holding hands), OR noticeable instability for 3 s.    Stand on one leg (left) Moderate: < 20 s   3.88s   Stand on one leg (right) Moderate: < 20 s   6.62s   Stand on one leg - lowest score 1    Compensatory Stepping Correction - Forward Normal: Recovers independently with a single, large step (second realignement is allowed).    Compensatory Stepping Correction - Backward Moderate: More than one step is required to recover equilibrium   4 small steps   Compensatory Stepping Correction - Left Lateral Moderate: Several steps to recover equilibrium   2 small steps   Compensatory Stepping Correction - Right Lateral Moderate: Several steps to recover equilibrium   2 small steps   Stepping Corredtion Lateral - lowest score 1    Stance - Feet together, eyes open, firm surface  Normal: 30s    Stance - Feet together, eyes closed, foam surface  Normal: 30s    Incline - Eyes Closed Normal: Stands independently 30s and aligns with gravity    Change in Gait Speed Moderate: Unable to change walking speed or signs of imbalance    Walk with head turns - Horizontal Moderate: performs head turns with reduction in gait speed.    Walk with pivot turns Moderate:Turns with feet close SLOW (>4 steps) with good balance.    Step over obstacles Normal: Able to step over box with minimal change of gait speed and with good balance.    Timed UP & GO with Dual Task Moderate: Dual Task affects either counting OR walking (>10%) when compared to the TUG without Dual Task.    Mini-BEST total score 20      Timed Up  and Go Test   Normal TUG (seconds) 12.13    Cognitive TUG (seconds) 24.94   Retro coutning by 3s starting at 51              Gait pattern: step through pattern, decreased arm swing- Right, decreased arm swing- Left, decreased stride length, Right foot flat, Left foot flat, knee flexed in stance- Right, knee flexed in  stance- Left, lateral lean- Right, trunk flexed, narrow BOS, poor foot clearance- Right, and poor foot clearance- Left Distance walked: Various clinic distances  Assistive device utilized: None Level of assistance: Modified independence Comments: Therapist continues to encourage use of rollator for community distances due to pt's difficulty w/cog-motor dual tasks and instability on unlevel surfaces    PATIENT EDUCATION: Education details: Goal assessment, updated PD screens date, obtaining SLP referral, how to obtain new PT referral if mobility needs change  Person educated: Patient Education method: Theatre stage manager Education comprehension: verbalized understanding   HOME EXERCISE PROGRAM: Access Code: [email or web address] URL: [email or web address] Date: 10/28/2021 Prepared by: Mickie Bail   Exercises - Seated Hamstring Stretch  - 1 x daily - 5 x weekly - 1 sets - 3 reps - 15 sec hold - Standing on old pillows in corner with eyes open and feet close together  - 1 x daily - 7 x weekly - 1 sets - 4 reps - 30-45 second hold - Standing in corner on old pillows with head turns   - 1 x daily - 7 x weekly - 1 sets - 4 reps - 30-45 second hold - Standing on foam in corner with eyes closed   - 1 x daily - 7 x weekly - 1 sets - 4 reps - 15-30 second hold - Sit to Stand Without Arm Support  - 1 x daily - 7 x weekly - 3 sets - 10 reps  At countertop - lateral weight shifting with UE reach and lifting up contralateral leg for SLS     GOALS: Goals reviewed with patient? Yes  SHORT TERM GOALS: Target date: 11/12/2021  Pt will improve gait velocity to at least 2.8 ft/s w/LRAD for improved gait efficiency   Baseline: 2.55 ft/s without AD; 2.36f/s or .851m with no AD Goal status: MET  2.  MiniBest to be performed and LTG written Baseline: 15/28 on 9/6 Goal status: MET  3.  Pt will improve 5 x STS to less than or equal to 18 seconds without UE support to demonstrate improved  functional strength and transfer efficiency.   Baseline: 21.66s without UE support; 17s no UE Goal status: MET  4. Pt will trial various assistive devices to determine safest option for functional mobility to improve independence   Baseline: Pt not wanting to use AD; trialed but didn't like  Goal Status: MET   LONG TERM GOALS: Target date: 12/03/2021  Pt will be independent with final HEP for improved strength, balance, transfers and gait.  Baseline:  Goal status: MET  2.  Pt will improve MiniBest to 20/28 for decreased fall risk and improvement with compensatory stepping strategies.   Baseline: 15/28; 20/28  Goal status: MET  3.  Pt will verbalize fall prevention strategies in the home for fall risk reduction and safety at home  Baseline:  Goal status: MET  4.  Pt will improve gait velocity to at least 3.0 ft/s with LRAD for improved gait efficiency and independence  Baseline: 2.55 ft/s without AD Goal status: DID NOT MEASURE   5.  Pt will  improve 5 x STS to less than or equal to 15 seconds without UE support to demonstrate improved functional strength and transfer efficiency.   Baseline: 21.66s without UE support; 19.68s without UE support  Goal status: NOT MET   ASSESSMENT:  CLINICAL IMPRESSION: Emphasis of skilled PT session on LTG assessment and DC from PT. Pt has met 3/5 LTGs, with one goal being discontinued and one goal not being met. Pt improved her MiniBest score to 20/28, with the greatest improvement being standing on foam surface w/EC. Pt has improved her 5x STS time and was able to perform without bracing BUEs on thighs, but did not reach her goal level. Therapist forgot to measure gait velocity during session, so that goal discontinued. Pt in agreement to DC from PT today due to feeling confident in her balance but reports continued difficulty with memory and speech, so requested referral to SLP. Pt did obtain rollator, but is not in agreement to use it in  community despite max education from multiple therapists.   OBJECTIVE IMPAIRMENTS Abnormal gait, decreased activity tolerance, decreased balance, decreased knowledge of use of DME, decreased mobility, difficulty walking, decreased safety awareness, and pain.   ACTIVITY LIMITATIONS carrying, lifting, bending, squatting, stairs, transfers, reach over head, and locomotion level  PARTICIPATION LIMITATIONS: shopping, community activity, and yard work  [place] Age, Fitness, and 1 comorbidity: chronic bilateral knee and lumbar pain  are also affecting patient's functional outcome.   REHAB POTENTIAL: Good  CLINICAL DECISION MAKING: Evolving/moderate complexity  EVALUATION COMPLEXITY: Moderate  PLAN: PT FREQUENCY: 2x/week  PT DURATION: 6 weeks  PLANNED INTERVENTIONS: Therapeutic exercises, Therapeutic activity, Neuromuscular re-education, Balance training, Gait training, Patient/Family education, Self Care, Stair training, DME instructions, Aquatic Therapy, Manual therapy, and Re-evaluation    Preciliano Castell E Lizzete Gough, PT, DPT 12/03/2021, 11:13 AM

## 2021-12-03 NOTE — Telephone Encounter (Signed)
Orders placed as reccommended by PT.

## 2021-12-08 DIAGNOSIS — Z23 Encounter for immunization: Secondary | ICD-10-CM | POA: Diagnosis not present

## 2021-12-08 DIAGNOSIS — Z853 Personal history of malignant neoplasm of breast: Secondary | ICD-10-CM | POA: Diagnosis not present

## 2021-12-08 DIAGNOSIS — I10 Essential (primary) hypertension: Secondary | ICD-10-CM | POA: Diagnosis not present

## 2021-12-08 DIAGNOSIS — E785 Hyperlipidemia, unspecified: Secondary | ICD-10-CM | POA: Diagnosis not present

## 2021-12-09 ENCOUNTER — Encounter: Payer: Medicare Other | Admitting: Physical Medicine and Rehabilitation

## 2021-12-14 DIAGNOSIS — Z23 Encounter for immunization: Secondary | ICD-10-CM | POA: Diagnosis not present

## 2021-12-15 ENCOUNTER — Ambulatory Visit: Payer: Medicare Other

## 2021-12-15 DIAGNOSIS — R2681 Unsteadiness on feet: Secondary | ICD-10-CM | POA: Diagnosis not present

## 2021-12-15 DIAGNOSIS — R490 Dysphonia: Secondary | ICD-10-CM

## 2021-12-15 DIAGNOSIS — R1310 Dysphagia, unspecified: Secondary | ICD-10-CM

## 2021-12-15 DIAGNOSIS — R413 Other amnesia: Secondary | ICD-10-CM | POA: Diagnosis not present

## 2021-12-15 DIAGNOSIS — R293 Abnormal posture: Secondary | ICD-10-CM | POA: Diagnosis not present

## 2021-12-15 DIAGNOSIS — R2689 Other abnormalities of gait and mobility: Secondary | ICD-10-CM | POA: Diagnosis not present

## 2021-12-15 NOTE — Therapy (Signed)
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 2011- no further tx. now- [name] , [name]   Cyst of finger 11/2011   annular cyst right long finger   Dental crowns present    Dermatitis    Frequency of urination    GERD (gastroesophageal reflux disease)    Hemorrhoid    History of colon polyps 2009   Also noted 2012 and 2017 by colonoscopy.   History of shingles 1971   Had right ischial recurrence in the March 2019   Hyperlipidemia    On atorvastatin   Hypertension    under control, has been on med. x 2 yrs.   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 Leta Baptist), neurosurgeon Dr. Arnoldo Morale   PONV (postoperative nausea and vomiting)    Seasonal allergies    Shingles of eyelid 1971   Right eyelid; recurrent -> last episode 05/11/2017   Tremors of nervous system    hands-essential tremor; associated with Parkinson's.   Trigger finger of right hand 11/2011   long finger   Past Surgical History:  Procedure Laterality Date   14-day Zio Patch Monitor  08/2020   (report to be scanned): Predominant SR w/ HR 61 to 142 bpm and average 86 bpm.  Total of 59 episodes of PAT/PSVT  4 to 15 beats (not noted on diary).   Fastest was 5 beats at a rate of 193 bpm, longest was 15 beats at a rate of 106 bpm.  Rare isolated PACs (as well as couplets and triplets) with rare isolated PVCs.  No sustained arrhythmias or bradycardia to explain syncope.   ANTERIOR CERVICAL DECOMPRESSION/DISCECTOMY FUSION 4 LEVELS N/A 11/14/2013   Procedure: ANTERIOR CERVICAL DECOMPRESSION/DISCECTOMY FUSION 4 LEVELS;  Surgeon: Newman Pies, MD;  Location: [facility];  Service: Neurosurgery;  Laterality: N/A;  C34 C45 C56 C67 anterior cervical fusion with interbody prosthesis plating and  bonegraft   APPENDECTOMY  age 28   BREAST LUMPECTOMY  06/16/2009   left; SLN bx.   BREAST LUMPECTOMY  07/01/2009   re-excision   BREAST SURGERY  02/22/1997   reduction   CATARACT EXTRACTION, BILATERAL     COLONOSCOPY WITH PROPOFOL N/A 12/03/2014   Procedure: COLONOSCOPY WITH PROPOFOL;  Surgeon: [name], MD;  Location: [facility];  Service: Endoscopy;  Laterality: N/A;   FOOT SURGERY  06/22/2011   left   KNEE ARTHROSCOPY  03/12/2005   right   KNEE ARTHROSCOPY     left   Lower Extremity Venous Dopplers  12/12/2020   No DVT bilaterally in the deep veins  or superficial veins.  No deep or superficial venous reflux noted bilaterally with exception of Right SSV at the knee.   NASAL SINUS SURGERY     x 2   NEPHRECTOMY LIVING DONOR Left 04/22/2008   donated to spouse 2010([facility])   TRANSTHORACIC ECHOCARDIOGRAM  12/10/2020   EF 60 to 65%.  Normal LV size and function.  No heart WMA.  GR 1 DD.  Normal RV, RVP and RAP.[name]  Normal valves.   TRIGGER FINGER RELEASE  12/16/2011   Procedure: RELEASE TRIGGER FINGER/A-1 PULLEY;  Surgeon: Tennis Must, MD;  Location: [facility];  Service: Orthopedics;  Laterality: Right;  RIGHT LONG FINGER TRIGGER RELEASE & ANNULAR CYST EXCISION   TUMOR EXCISION  age 81   right arm   Patient Active Problem List   Diagnosis Date Noted   Moderate persistent asthma without complication 46/96/2952    PSVT (paroxysmal supraventricular tachycardia) 11/26/2020   Syncope and collapse 11/24/2020   Primary osteoarthritis of right knee 05/12/2020   Primary osteoarthritis of left knee 05/12/2020   Hyperlipidemia    Bilateral calf pain 03/05/2019   Genetic testing 02/13/2019   Family history of ovarian cancer    Family history of bladder cancer    Family history of colon cancer    Family history of leukemia    Lumbar radiculopathy 12/08/2018   Myofascial pain 12/08/2018   Impaired gait and mobility 12/08/2018   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   Chronic nonallergic rhinitis 12/29/2016   Mild persistent asthma, uncomplicated 84/13/2440   Vocal fold paralysis, 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   Gastroesophageal reflux disease 12/29/2016   Cervical spondylosis with myelopathy and radiculopathy 11/14/2013   Essential tremor 06/13/2013   Cervical spondylosis without myelopathy 06/13/2013   Breast cancer of lower-outer quadrant of left female breast ([place]) 09/10/2010    ONSET DATE: 12/17/21  REFERRING DIAG: Dystonia   THERAPY DIAG:  Dysphagia, unspecified type - Plan: SLP plan of care cert/re-cert  Memory deficit - Plan: SLP plan of care cert/re-cert  Dysphonia - Plan: SLP plan of care cert/re-cert  Rationale for Evaluation and Treatment Rehabilitation  SUBJECTIVE:   SUBJECTIVE STATEMENT: " My voice is not doing as well. I had botox 4 weeks ago and my voice is still hoarse, which by now would be smoother." Pt accompanied by: self  PERTINENT HISTORY: hand and voice tremors, dystonia, receiving Botox injections.   PAIN:  Are you having pain? No   FALLS: Has patient fallen in last 6 months?  No  LIVING ENVIRONMENT: Lives with: lives with their family and lives alone Lives in: House/apartment - independent living   PLOF:  Level of assistance: Independent with IADLs Employment: Retired   PATIENT GOALS " speech more with clarity without hoarseness of possible."  OBJECTIVE:    DIAGNOSTIC FINDINGS: mild higher level cognitive deficits and dystonia with increase hoarseness.  COGNITION: Overall cognitive status: Impaired Areas of impairment:  Attention: Impaired: Selective, Alternating Memory: Impaired: Short term Functional deficits: Pt supports mild memory deficits but scored severe impairments in CLQT symbol cancellation task and symbol trial likely due to memory deficits.  COGNITIVE COMMUNICATION Following directions: Follows multi-step commands consistently  Auditory comprehension: WFL Verbal expression: WFL Functional communication: WFL  SOCIAL HISTORY: Occupation: retried  Building control surveyor intake: optimal Caffeine/alcohol intake: minimal Daily voice use: moderate  PERCEPTUAL VOICE ASSESSMENT: Voice quality: hoarse Vocal abuse:  N/A Resonance: normal Respiratory function: diaphragmatic/abdominal breathing  OBJECTIVE VOICE ASSESSMENT: Maximum phonation time for sustained "ah": 78 -tremors noted Conversational loudness average: 70 dB  Conversational loudness range: 66-85 dB Reading loudness average: 70 dB   RECOMMENDATIONS FROM OBJECTIVE SWALLOW STUDY (MBSS/FEES):  last one completed 2019 Objective swallow impairments: WFL Objective recommended compensations: Regular and thin liquids   ORAL MOTOR EXAMINATION: Overall status: WFL Comments: tremors with /ah/  CLINICAL SWALLOW ASSESSMENT:   Current diet: regular Dentition: adequate natural dentition Patient directly observed with POs: Yes: thin liquids  Feeding: able to feed self Liquids provided by: cup and straw Oral phase signs and symptoms:  WFL Pharyngeal phase signs and symptoms: WFL   STANDARDIZED ASSESSMENTS: CLQT: Attention: Moderate and Memory: Severe   PATIENT REPORTED OUTCOME MEASURES (PROM): Give PROM next session   TODAY'S TREATMENT:  12-17-21: BSE, vocal assessment and initiated cognitive assessment.    PATIENT EDUCATION: Education details: swallow strategies and water  consumption. Person educated: Patient Education method: Explanation Education comprehension: verbalized understanding     GOALS: Goals reviewed with patient? Yes  SHORT TERM GOALS: Target date: 01/05/2022    Pt will support utilizing vocal hygiene strategies and participate in vocal strengthen exercises x1-2 a week. Baseline: 12/17/21 Goal status: INITIAL  2.  Pt will consume thin liquids via cup and straw using small sips without overt s/s aspiration and only rarely occasional reports of overt s/s at home.  Baseline: 12/17/21 Goal status: INITIAL  3.  Pt will participate in ongoing cognitive assessment (goal will be adjusted after assessment.) Baseline: 12/17/21 Goal status: INITIAL   LONG TERM GOALS: Target date: 01/26/2022    Pt will support utilizing vocal hygiene strategies and participate in vocal strengthen exercises with increased self-improvement score on PROM. Baseline: 10/26 Goal status: INITIAL  2.  Pt will consume thin liquids via cup and straw using small sips without overt s/s aspiration and only rarely occasional reports of overt s/s at home.  Baseline: 10/26 Goal status: INITIAL  3.  Pt will demonstrate improvement in cognitive function (goal adjusted based on continued assessment.) Baseline: 10/26 Goal status: INITIAL  ASSESSMENT:  CLINICAL IMPRESSION: Patient is a 81 y.o. female who was seen today for dystonia, dysphagia and memory deficits. Pt supports continued hoarseness after Botox injection over 4 weeks ago. Pt support occasional s/s aspiration on liquids, requiring use of small sips with cup/straw. Pt denies cognitive deficits, only mild memory impairments due to age but lives alone, responsible for all iADLs and is driving. Pt demonstrated 70 dB volume in conversation and while reading, similar to previous screenings with SLP services, however pt noted exacerbated hoarseness. Pt's oral motor function appeared WFL. Pt demonstrated appropriate  containment, mastication, oral clearance, timely swallow and no overt s/s aspiration on regular texture snack thin liquids (via cup, via straw and 3oz YALE swallow test.) SLP attempted to administer cognitive linguistic assessment CLQT, but only completed two subsection due to time restraints. Pt scored 0/10 on symbol cancellation task and 3/12 on symbol trial, deficits appeared related to memory. SLP will continue cognitive assessment next session. Pt benefit from ST services in order to maximize functional independence.   OBJECTIVE IMPAIRMENTS include attention, memory, voice disorder, and dysphagia. These impairments are limiting patient from managing medications, managing appointments, managing finances, household responsibilities, ADLs/IADLs, effectively communicating at home and in community, and safety when swallowing. Factors affecting potential to achieve goals and functional outcome are ability to learn/carryover information and family/community support.. Patient will benefit from skilled SLP services to address above impairments and improve overall function.  REHAB POTENTIAL: Good  PLAN: SLP FREQUENCY: 2x/week  SLP DURATION: 6 weeks  PLANNED INTERVENTIONS:  Aspiration precaution training, Diet toleration management , Cueing hierachy, Cognitive reorganization, Internal/external aids, Functional tasks, SLP instruction and feedback, Compensatory strategies, and Patient/family education    [place], [place] 12/15/2021, 2:47 PM

## 2021-12-16 ENCOUNTER — Encounter
Payer: Medicare Other | Attending: Physical Medicine and Rehabilitation | Admitting: Physical Medicine and Rehabilitation

## 2021-12-16 ENCOUNTER — Encounter: Payer: Self-pay | Admitting: Physical Medicine and Rehabilitation

## 2021-12-16 VITALS — BP 129/88 | HR 88 | Ht 60.0 in | Wt 127.6 lb

## 2021-12-16 DIAGNOSIS — G894 Chronic pain syndrome: Secondary | ICD-10-CM

## 2021-12-16 DIAGNOSIS — Z79891 Long term (current) use of opiate analgesic: Secondary | ICD-10-CM | POA: Diagnosis not present

## 2021-12-16 DIAGNOSIS — M1711 Unilateral primary osteoarthritis, right knee: Secondary | ICD-10-CM | POA: Diagnosis not present

## 2021-12-16 DIAGNOSIS — Z5181 Encounter for therapeutic drug level monitoring: Secondary | ICD-10-CM

## 2021-12-16 DIAGNOSIS — M1712 Unilateral primary osteoarthritis, left knee: Secondary | ICD-10-CM | POA: Diagnosis not present

## 2021-12-16 MED ORDER — BETAMETHASONE SOD PHOS & ACET 6 (3-3) MG/ML IJ SUSP
24.0000 mg | Freq: Once | INTRAMUSCULAR | Status: AC
  Administered 2021-12-16: 24 mg via INTRAMUSCULAR

## 2021-12-16 MED ORDER — BETAMETHASONE SOD PHOS & ACET 6 (3-3) MG/ML IJ SUSP: 12.0000 mg | Freq: Once | INTRAMUSCULAR | Status: DC

## 2021-12-16 MED ORDER — LIDOCAINE HCL 1 % IJ SOLN
2.0000 mL | Freq: Once | INTRAMUSCULAR | Status: AC
  Administered 2021-12-16: 2 mL

## 2021-12-16 MED ORDER — TRIAMCINOLONE ACETONIDE 40 MG/ML IJ SUSP: 80.0000 mg | Freq: Once | INTRAMUSCULAR | Status: DC

## 2021-12-16 NOTE — Progress Notes (Signed)
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 09/18/19 Back injection- R L4-5 transforminal.  epidural steroid injection.  Here for f/u for chronic pain and B/L knee injections.    Doing OK, but knees are hurting- Only lasted ~ 2 months.   Then pain came back.  Gradually got worse from late July- .   Still taking tramadol sometimes- usually takes ~ 1x/day every 2-3 days- usually at night.   Gabapentin makes her feel bad the next day. So hasn't been taking regularly.   Takes Flexeril sometimes- about as often as takes tramadol.   Plan:  steroid injection was performed at B/L knees using 1% plain Lidocaine and 1cc of Celestone B/L . This was well tolerated.  Cleaned with betadine x3 and allowed to dry- then alcohol then injected using 27 gauge 1.5 inch needle- no bleeding or complications.    F/U in 3 months for steroid injections of B/L knees  Lidocaine will kick in 15 minutes- and wear off tonight- the steroid will kick in tomorrow within 24 hours and take up to 72 hours to fully kick in.   2. Con't Tramadol- last refill was 09/18/21- doesn't needs refills at this time-  Doesn't take very often- last 2 nights ago.    3. Stop Gabapentin- doesn't need ot take, since not helping.   4. Take tramadol at least the night prior to seeing me next time- just to verify you are taking it.    5. Con't Cyclobenzaprine- as needed- doesn't need refills- refilled 7/23.    6. F/U in 3 months for celestone steroid injections of B/L knees and chronic pain.   7. UDS today per clinic policy- paperwork given to pt.   I spent a total of 21   minutes on total care today- >50% coordination of care- due to 8 minutes on injections and 13 minutes on  discussing pain mgmt, as well as stopping gabapentin.

## 2021-12-16 NOTE — Addendum Note (Signed)
Addended by: Dessa Phi D on: 12/16/2021 02:27 PM   Modules accepted: Orders

## 2021-12-16 NOTE — Patient Instructions (Signed)
Plan:  steroid injection was performed at B/L knees using 1% plain Lidocaine and 1cc of Celestone B/L . This was well tolerated.  Cleaned with betadine x3 and allowed to dry- then alcohol then injected using 27 gauge 1.5 inch needle- no bleeding or complications.    F/U in 3 months for steroid injections of B/L knees  Lidocaine will kick in 15 minutes- and wear off tonight- the steroid will kick in tomorrow within 24 hours and take up to 72 hours to fully kick in.   2. Con't Tramadol- last refill was 09/18/21- doesn't needs refills at this time-  Doesn't take very often- last 2 nights ago.    3. Stop Gabapentin- doesn't need ot take, since not helping.   4. Take tramadol at least the night prior to seeing me next time- just to verify you are taking it.    5. Con't Cyclobenzaprine- as needed- doesn't need refills- refilled 7/23.    6. F/U in 38month for celestone steroid injections of B/L knees and chronic pain.

## 2021-12-21 ENCOUNTER — Ambulatory Visit: Payer: Medicare Other | Admitting: Speech Pathology

## 2021-12-21 LAB — DRUG TOX MONITOR 1 W/CONF, ORAL FLD
Amobarbital: NEGATIVE ng/mL (ref <10)
Amphetamines: NEGATIVE ng/mL (ref <10)
Barbiturates: POSITIVE ng/mL — AB (ref <10)
Benzodiazepines: NEGATIVE ng/mL (ref <0.50)
Buprenorphine: NEGATIVE ng/mL (ref <0.10)
Butalbital: NEGATIVE ng/mL (ref <10)
Cocaine: NEGATIVE ng/mL (ref <5.0)
Fentanyl: NEGATIVE ng/mL (ref <0.10)
Heroin Metabolite: NEGATIVE ng/mL (ref <1.0)
MARIJUANA: NEGATIVE ng/mL (ref <2.5)
MDMA: NEGATIVE ng/mL (ref <10)
Meprobamate: NEGATIVE ng/mL (ref <2.5)
Methadone: NEGATIVE ng/mL (ref <5.0)
Nicotine Metabolite: NEGATIVE ng/mL (ref <5.0)
Opiates: NEGATIVE ng/mL (ref <2.5)
Pentobarbital: NEGATIVE ng/mL (ref <10)
Phencyclidine: NEGATIVE ng/mL (ref <10)
Phenobarbital: >500 ng/mL — ABNORMAL HIGH (ref <10)
Secobarbital: NEGATIVE ng/mL (ref <10)
Tapentadol: NEGATIVE ng/mL (ref <5.0)
Tramadol: NEGATIVE ng/mL (ref <5.0)
Zolpidem: NEGATIVE ng/mL (ref <5.0)

## 2021-12-21 LAB — DRUG TOX ALC METAB W/CON, ORAL FLD: Alcohol Metabolite: NEGATIVE ng/mL (ref <25)

## 2021-12-25 ENCOUNTER — Ambulatory Visit: Payer: Medicare Other | Attending: Diagnostic Neuroimaging | Admitting: Speech Pathology

## 2021-12-25 DIAGNOSIS — R1310 Dysphagia, unspecified: Secondary | ICD-10-CM | POA: Diagnosis not present

## 2021-12-25 DIAGNOSIS — R471 Dysarthria and anarthria: Secondary | ICD-10-CM

## 2021-12-25 DIAGNOSIS — R490 Dysphonia: Secondary | ICD-10-CM

## 2021-12-25 NOTE — Therapy (Unsigned)
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 2011- no further tx. now- [name] , [name]   Cyst of finger 11/2011   annular cyst right long finger   Dental crowns present    Dermatitis    Frequency of urination    GERD (gastroesophageal reflux disease)    Hemorrhoid    History of colon polyps 2009   Also noted 2012 and 2017 by colonoscopy.   History of shingles 1971   Had right ischial recurrence in the March 2019   Hyperlipidemia    On atorvastatin   Hypertension    under control, has been on med. x 2 yrs.   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 Leta Baptist), neurosurgeon Dr. Arnoldo Morale   PONV (postoperative nausea and vomiting)    Seasonal allergies    Shingles of eyelid 1971   Right eyelid; recurrent -> last episode 05/11/2017   Tremors of nervous system    hands-essential tremor; associated with Parkinson's.   Trigger finger of right hand 11/2011   long finger   Past Surgical History:  Procedure Laterality Date   14-day Zio Patch Monitor  08/2020   (report to be scanned): Predominant SR w/ HR 61 to 142 bpm and average 86 bpm.  Total of 59 episodes of PAT/PSVT  4 to 15 beats (not noted on diary).  Fastest  was 5 beats at a rate of 193 bpm, longest was 15 beats at a rate of 106 bpm.  Rare isolated PACs (as well as couplets and triplets) with rare isolated PVCs.  No sustained arrhythmias or bradycardia to explain syncope.   ANTERIOR CERVICAL DECOMPRESSION/DISCECTOMY FUSION 4 LEVELS N/A 11/14/2013   Procedure: ANTERIOR CERVICAL DECOMPRESSION/DISCECTOMY FUSION 4 LEVELS;  Surgeon: Newman Pies, MD;  Location: [facility];  Service: Neurosurgery;  Laterality: N/A;  C34 C45 C56 C67 anterior cervical fusion with interbody prosthesis plating and  bonegraft   APPENDECTOMY  age 40   BREAST LUMPECTOMY  06/16/2009   left; SLN bx.   BREAST LUMPECTOMY  07/01/2009   re-excision   BREAST SURGERY  02/22/1997   reduction   CATARACT EXTRACTION, BILATERAL     COLONOSCOPY WITH PROPOFOL N/A 12/03/2014   Procedure: COLONOSCOPY WITH PROPOFOL;  Surgeon: [name], MD;  Location: [facility];  Service: Endoscopy;  Laterality: N/A;   FOOT SURGERY  06/22/2011   left   KNEE ARTHROSCOPY  03/12/2005   right   KNEE ARTHROSCOPY     left   Lower Extremity Venous Dopplers  12/12/2020   No DVT bilaterally in the deep veins or  superficial veins.  No deep or superficial venous reflux noted bilaterally with exception of Right SSV at the knee.   NASAL SINUS SURGERY     x 2   NEPHRECTOMY LIVING DONOR Left 04/22/2008   donated to spouse 2010([facility])   TRANSTHORACIC ECHOCARDIOGRAM  12/10/2020   EF 60 to 65%.  Normal LV size and function.  No heart WMA.  GR 1 DD.  Normal RV, RVP and RAP.[name]  Normal valves.   TRIGGER FINGER RELEASE  12/16/2011   Procedure: RELEASE TRIGGER FINGER/A-1 PULLEY;  Surgeon: Tennis Must, MD;  Location: [facility];  Service: Orthopedics;  Laterality: Right;  RIGHT LONG FINGER TRIGGER RELEASE & ANNULAR CYST EXCISION   TUMOR EXCISION  age 46   right arm   Patient Active Problem List   Diagnosis Date Noted   Moderate persistent asthma without complication 50/35/4656   PSVT  (paroxysmal supraventricular tachycardia) 11/26/2020   Syncope and collapse 11/24/2020   Primary osteoarthritis of right knee 05/12/2020   Primary osteoarthritis of left knee 05/12/2020   Hyperlipidemia    Bilateral calf pain 03/05/2019   Genetic testing 02/13/2019   Family history of ovarian cancer    Family history of bladder cancer    Family history of colon cancer    Family history of leukemia    Lumbar radiculopathy 12/08/2018   Myofascial pain 12/08/2018   Impaired gait and mobility 12/08/2018   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   Chronic nonallergic rhinitis 12/29/2016   Mild persistent asthma, uncomplicated 81/27/5170   Vocal fold paralysis, 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 No   OBJECTIVE:   TODAY'S TREATMENT:  12-25-21: ***  12-17-21: BSE, vocal assessment and initiated cognitive assessment.    PATIENT EDUCATION: Education details: swallow strategies and water consumption. Person educated: Patient Education method: Explanation Education comprehension: verbalized understanding   GOALS: Goals reviewed with patient? Yes  SHORT TERM GOALS: Target date: 01/05/2022    Pt will support utilizing vocal hygiene strategies and participate in vocal strengthen exercises x1-2 a week. Baseline: 12/17/21 Goal status: INITIAL  2.  Pt will consume thin liquids via cup and straw using small sips without overt s/s aspiration and only rarely  occasional reports of overt s/s at home.  Baseline: 12/17/21 Goal status: INITIAL  3.  Pt will participate in ongoing cognitive assessment (goal will be adjusted after assessment.) Baseline: 12/17/21 Goal status: INITIAL   LONG TERM GOALS: Target date: 01/26/2022    Pt will support utilizing vocal hygiene strategies and participate in vocal strengthen exercises with increased self-improvement score on PROM. Baseline: 10/26 Goal status: INITIAL  2.  Pt will consume thin liquids via cup and straw using small sips without overt s/s aspiration and only rarely occasional reports of overt s/s at home.  Baseline: 10/26 Goal status: INITIAL  3.  Pt will demonstrate improvement in cognitive function (goal adjusted based on continued assessment.) Baseline: 10/26 Goal status: INITIAL  ASSESSMENT:  CLINICAL IMPRESSION: Patient is a 81 y.o. female who was seen today for dystonia,  dysphagia and memory deficits. Pt supports continued hoarseness after Botox injection over 4 weeks ago. Pt support occasional s/s aspiration on liquids, requiring use of small sips with cup/straw. Pt denies cognitive deficits, only mild memory impairments due to age but lives alone, responsible for all iADLs and is driving. Pt demonstrated 70 dB volume in conversation and while reading, similar to previous screenings with SLP services, however pt noted exacerbated hoarseness. Pt's oral motor function appeared WFL. Pt demonstrated appropriate containment, mastication, oral clearance, timely swallow and no overt s/s aspiration on regular texture snack thin liquids (via cup, via straw and 3oz YALE swallow test.) SLP attempted to administer cognitive linguistic assessment CLQT, but only completed two subsection due to time restraints. Pt scored 0/10 on symbol cancellation task and 3/12 on symbol trial, deficits appeared related to memory. SLP will continue cognitive assessment next session. Pt benefit from ST services in order to  maximize functional independence.   OBJECTIVE IMPAIRMENTS include attention, memory, voice disorder, and dysphagia. These impairments are limiting patient from managing medications, managing appointments, managing finances, household responsibilities, ADLs/IADLs, effectively communicating at home and in community, and safety when swallowing. Factors affecting potential to achieve goals and functional outcome are ability to learn/carryover information and family/community support.. Patient will benefit from skilled SLP services to address above impairments and improve overall function.  REHAB POTENTIAL: Good  PLAN: SLP FREQUENCY: 2x/week  SLP DURATION: 6 weeks  PLANNED INTERVENTIONS: Aspiration precaution training, Diet toleration management , Cueing hierachy, Cognitive reorganization, Internal/external aids, Functional tasks, SLP instruction and feedback, Compensatory strategies, and Patient/family education    Su Monks, CCC-SLP 12/25/2021, 2:42 PM

## 2021-12-25 NOTE — Patient Instructions (Signed)
Swallowing:  Practice swallowing liquids with INTENT  This means you focus on and direct the swallow instead of it being a passive practice  Take liquid into mouth, gather, intentional swallow  Keep doing small sips   If you feel some go the "wrong way," do a hard swallow after that reflexive cough

## 2021-12-29 ENCOUNTER — Ambulatory Visit: Payer: Medicare Other | Admitting: Speech Pathology

## 2021-12-29 DIAGNOSIS — R490 Dysphonia: Secondary | ICD-10-CM

## 2021-12-29 DIAGNOSIS — R471 Dysarthria and anarthria: Secondary | ICD-10-CM

## 2021-12-29 DIAGNOSIS — R1310 Dysphagia, unspecified: Secondary | ICD-10-CM | POA: Diagnosis not present

## 2021-12-29 NOTE — Therapy (Signed)
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 2011- no further tx. now- [name] , [name]   Cyst of finger 11/2011   annular cyst right long finger   Dental crowns present    Dermatitis    Frequency of urination    GERD (gastroesophageal reflux disease)    Hemorrhoid    History of colon polyps 2009   Also noted 2012 and 2017 by colonoscopy.   History of shingles 1971   Had right ischial recurrence in the March 2019   Hyperlipidemia    On atorvastatin   Hypertension    under control, has been on med. x 2 yrs.   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 Leta Baptist), neurosurgeon Dr. Arnoldo Morale   PONV (postoperative nausea and vomiting)    Seasonal allergies    Shingles of eyelid 1971   Right eyelid; recurrent -> last episode 05/11/2017   Tremors of nervous system    hands-essential tremor; associated with Parkinson's.   Trigger finger of right hand 11/2011   long finger   Past Surgical History:  Procedure Laterality Date   14-day Zio Patch Monitor  08/2020   (report to be scanned): Predominant SR w/ HR 61 to 142 bpm and average 86 bpm.  Total of 59 episodes of PAT/PSVT  4 to 15 beats (not noted on diary).   Fastest was 5 beats at a rate of 193 bpm, longest was 15 beats at a rate of 106 bpm.  Rare isolated PACs (as well as couplets and triplets) with rare isolated PVCs.  No sustained arrhythmias or bradycardia to explain syncope.   ANTERIOR CERVICAL DECOMPRESSION/DISCECTOMY FUSION 4 LEVELS N/A 11/14/2013   Procedure: ANTERIOR CERVICAL DECOMPRESSION/DISCECTOMY FUSION 4 LEVELS;  Surgeon: Newman Pies, MD;  Location: [place] [facility];  Service: Neurosurgery;  Laterality: N/A;  C34 C45 C56 C67 anterior cervical fusion with interbody prosthesis plating and  bonegraft   APPENDECTOMY  age 77   BREAST LUMPECTOMY  06/16/2009   left; SLN bx.   BREAST LUMPECTOMY  07/01/2009   re-excision   BREAST SURGERY  02/22/1997   reduction   CATARACT EXTRACTION, BILATERAL     COLONOSCOPY WITH PROPOFOL N/A 12/03/2014   Procedure: COLONOSCOPY WITH PROPOFOL;  Surgeon: [name], MD;  Location: [facility];  Service: Endoscopy;  Laterality: N/A;   FOOT SURGERY  06/22/2011   left   KNEE ARTHROSCOPY  03/12/2005   right   KNEE ARTHROSCOPY     left   Lower Extremity Venous Dopplers  12/12/2020   No DVT bilaterally in the deep veins  or superficial veins.  No deep or superficial venous reflux noted bilaterally with exception of Right SSV at the knee.   NASAL SINUS SURGERY     x 2   NEPHRECTOMY LIVING DONOR Left 04/22/2008   donated to spouse 2010([facility])   TRANSTHORACIC ECHOCARDIOGRAM  12/10/2020   EF 60 to 65%.  Normal LV size and function.  No heart WMA.  GR 1 DD.  Normal RV, RVP and RAP.[name]  Normal valves.   TRIGGER FINGER RELEASE  12/16/2011   Procedure: RELEASE TRIGGER FINGER/A-1 PULLEY;  Surgeon: Tennis Must, MD;  Location: [place];  Service: Orthopedics;  Laterality: Right;  RIGHT LONG FINGER TRIGGER RELEASE & ANNULAR CYST EXCISION   TUMOR EXCISION  age 8   right arm   Patient Active Problem List   Diagnosis Date Noted   Moderate persistent asthma without complication 27/07/2374    PSVT (paroxysmal supraventricular tachycardia) 11/26/2020   Syncope and collapse 11/24/2020   Primary osteoarthritis of right knee 05/12/2020   Primary osteoarthritis of left knee 05/12/2020   Hyperlipidemia    Bilateral calf pain 03/05/2019   Genetic testing 02/13/2019   Family history of ovarian cancer    Family history of bladder cancer    Family history of colon cancer    Family history of leukemia    Lumbar radiculopathy 12/08/2018   Myofascial pain 12/08/2018   Impaired gait and mobility 12/08/2018   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   Chronic nonallergic rhinitis 12/29/2016   Mild persistent asthma, uncomplicated 28/31/5176   Vocal fold paralysis, 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 No   OBJECTIVE:   TODAY'S TREATMENT:  12-29-21: Over 5 minute discourse sample, pt demonstrating decreased breath support and reporting fatigue following speaking. Perceptually appears to be putting forth more effort as sample progresses. Pt reports that hoarseness has increased since last time stroboscopy was completed, which per chart review was in 2017 with Dr. Joya Gaskins. Has continued to see Dr. Joya Gaskins for botox injections but denies additional instrumental evaluation of voice. SLP recommends ENT referral to visualize the larynx to determine if there are any additional factors impacting pt's hoarse vocal qualiy aside from vocal tremor. Pt is  agreeable to plan, request referral to [facility] to see current ENT provider if possible. At subsequent session, plan to trial flow phonation and continue working on achieving continuous air flow through semi occluded vocal tract exercises with voicing. SLP will also consider candidacy for EMST d/t decreased breath support evidenced.   12-25-21: SLP introduced semi-occluded vocal tract exercises to aid in reduction of persistent hoarseness which accompanies pt's vocal tremor. Following demonstration and direct instruction, achieves continuous air flow into cup of water via drinking straw, evidenced by bubble production, with rare min-A. Demonstrates with min verbal cues 5 trials. Next level of sustained vowel phonation requires consistent max-A for coordination of voicing, continuous air flow without lateral escape of air with pt able to achieve audible voicing in 2/6 trials. Trials with varying biofeedback (bubbles and  feel of air on hand). Update HEP for ongoing trials of sustained vowel with semi-occluded vocal tract.   12-17-21: BSE, vocal assessment and initiated cognitive assessment.    PATIENT EDUCATION: Education details: swallow strategies and water consumption. Person educated: Patient Education method: Explanation Education comprehension: verbalized understanding   GOALS: Goals reviewed with patient? Yes  SHORT TERM GOALS: Target date: 01/05/2022    Pt will support utilizing vocal hygiene strategies and participate in vocal strengthen exercises x1-2 a week. Baseline: 12/17/21 Goal status: INITIAL  2.  Pt will consume thin liquids via cup and straw using small sips without overt s/s aspiration and only rarely occasional reports of overt s/s at home.  Baseline: 12/17/21 Goal status: INITIAL  3.  Pt will participate in ongoing cognitive assessment (goal will be adjusted after assessment.) Baseline: 12/17/21 Goal status: INITIAL   LONG TERM GOALS: Target date: 01/26/2022    Pt  will support utilizing vocal hygiene strategies and participate in vocal strengthen exercises with increased self-improvement score on PROM. Baseline: 10/26 Goal status: INITIAL  2.  Pt will consume thin liquids via cup and straw using small sips without overt s/s aspiration and only rarely occasional reports of overt s/s at home.  Baseline: 10/26 Goal status: INITIAL  3.  Pt will demonstrate improvement in cognitive function (goal adjusted based on continued assessment.) Baseline: 10/26 Goal status: INITIAL  ASSESSMENT:  CLINICAL IMPRESSION: Patient is a 81 y.o. female who was seen today for dystonia, dysphagia and memory deficits. Pt supports continued hoarseness after Botox injection over 4 weeks ago. Pt support occasional s/s aspiration on liquids, requiring use of small sips with cup/straw. Pt denies cognitive deficits, only mild memory impairments due to age but lives alone, responsible for all iADLs and is driving. Pt demonstrated 70 dB volume in conversation and while reading, similar to previous screenings with SLP services, however pt noted exacerbated hoarseness. Pt's oral motor function appeared WFL. Pt demonstrated appropriate containment, mastication, oral clearance, timely swallow and no overt s/s aspiration on regular texture snack thin liquids (via cup, via straw and 3oz YALE swallow test.) SLP attempted to administer cognitive linguistic assessment CLQT, but only completed two subsection due to time restraints. Pt scored 0/10 on symbol cancellation task and 3/12 on symbol trial, deficits appeared related to memory. SLP will continue cognitive assessment next session. Pt benefit from ST services in order to maximize functional independence.   OBJECTIVE IMPAIRMENTS include attention, memory, voice disorder, and dysphagia. These impairments are limiting patient from managing medications, managing appointments, managing finances, household responsibilities, ADLs/IADLs, effectively  communicating at home and in community, and safety when swallowing. Factors affecting potential to achieve goals and functional outcome are ability to learn/carryover information and family/community support.. Patient will benefit from skilled SLP services to address above impairments and improve overall function.  REHAB POTENTIAL: Good  PLAN: SLP FREQUENCY: 2x/week  SLP DURATION: 6 weeks  PLANNED INTERVENTIONS: Aspiration precaution training, Diet toleration management , Cueing hierachy, Cognitive reorganization, Internal/external aids, Functional tasks, SLP instruction and feedback, Compensatory strategies, and Patient/family education    Su Monks, CCC-SLP 12/29/2021, 2:46 PM

## 2021-12-29 NOTE — Patient Instructions (Signed)
I am going to request a referral for you to have your larynx evaluation to determine what may be causing your increased hoarseness by an Otolaryngologist- we will request Baptist since you're already an established pt with Dr. Joya Gaskins.

## 2021-12-31 ENCOUNTER — Ambulatory Visit: Payer: Medicare Other | Admitting: Speech Pathology

## 2021-12-31 DIAGNOSIS — R490 Dysphonia: Secondary | ICD-10-CM

## 2021-12-31 DIAGNOSIS — R1310 Dysphagia, unspecified: Secondary | ICD-10-CM | POA: Diagnosis not present

## 2021-12-31 DIAGNOSIS — R471 Dysarthria and anarthria: Secondary | ICD-10-CM | POA: Diagnosis not present

## 2021-12-31 NOTE — Therapy (Signed)
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 2011- no further tx. now- [name] , [name]   Cyst of finger 11/2011   annular cyst right long finger   Dental crowns present    Dermatitis    Frequency of urination    GERD (gastroesophageal reflux disease)    Hemorrhoid    History of colon polyps 2009   Also noted 2012 and 2017 by colonoscopy.   History of shingles 1971   Had right ischial recurrence in the March 2019   Hyperlipidemia    On atorvastatin   Hypertension    under control, has been on med. x 2 yrs.   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 Leta Baptist), neurosurgeon Dr. Arnoldo Morale   PONV (postoperative nausea and vomiting)    Seasonal allergies    Shingles of eyelid 1971   Right eyelid; recurrent -> last episode 05/11/2017   Tremors of nervous system    hands-essential tremor; associated with Parkinson's.   Trigger finger of right hand 11/2011   long finger   Past Surgical History:  Procedure Laterality Date   14-day Zio Patch Monitor  08/2020   (report to be scanned): Predominant SR w/ HR 61 to 142 bpm and average 86 bpm.  Total of 59 episodes of PAT/PSVT  4 to 15 beats (not noted on diary).  Fastest was 5 beats at a rate of 193 bpm, longest was 15 beats at a rate of 106 bpm.  Rare isolated PACs (as well as couplets and triplets) with rare isolated PVCs.  No sustained arrhythmias or bradycardia to explain syncope.   ANTERIOR CERVICAL DECOMPRESSION/DISCECTOMY FUSION 4 LEVELS N/A 11/14/2013    Procedure: ANTERIOR CERVICAL DECOMPRESSION/DISCECTOMY FUSION 4 LEVELS;  Surgeon: Newman Pies, MD;  Location: [place] [facility];  Service: Neurosurgery;  Laterality: N/A;  C34 C45 C56 C67 anterior cervical fusion with interbody prosthesis plating and  bonegraft   APPENDECTOMY  age 24   BREAST LUMPECTOMY  06/16/2009   left; SLN bx.   BREAST LUMPECTOMY  07/01/2009   re-excision   BREAST SURGERY  02/22/1997   reduction   CATARACT EXTRACTION, BILATERAL     COLONOSCOPY WITH PROPOFOL N/A 12/03/2014   Procedure: COLONOSCOPY WITH PROPOFOL;  Surgeon: [name], MD;  Location: [facility];  Service: Endoscopy;  Laterality: N/A;   FOOT SURGERY  06/22/2011   left   KNEE ARTHROSCOPY  03/12/2005   right   KNEE ARTHROSCOPY     left   Lower Extremity Venous Dopplers  12/12/2020   No DVT bilaterally in the deep veins or superficial veins.  No deep or superficial venous reflux noted bilaterally with exception of Right SSV at the knee.   NASAL SINUS SURGERY     x 2   NEPHRECTOMY LIVING DONOR Left 04/22/2008   donated to spouse 2010([facility])   TRANSTHORACIC ECHOCARDIOGRAM  12/10/2020   EF 60 to 65%.  Normal LV size and function.  No heart WMA.  GR 1 DD.  Normal RV, RVP and RAP.[name]  Normal valves.  TRIGGER FINGER RELEASE  12/16/2011   Procedure: RELEASE TRIGGER FINGER/A-1 PULLEY;  Surgeon: Tennis Must, MD;  Location: [place];  Service: Orthopedics;  Laterality: Right;  RIGHT LONG FINGER TRIGGER RELEASE & ANNULAR CYST EXCISION   TUMOR EXCISION  age 6   right arm   Patient Active Problem List   Diagnosis Date Noted   Moderate persistent asthma without complication 16/11/9602   PSVT (paroxysmal supraventricular tachycardia) 11/26/2020   Syncope and collapse 11/24/2020   Primary osteoarthritis of right knee 05/12/2020   Primary osteoarthritis of left knee 05/12/2020   Hyperlipidemia    Bilateral calf pain 03/05/2019   Genetic testing 02/13/2019   Family history of ovarian  cancer    Family history of bladder cancer    Family history of colon cancer    Family history of leukemia    Lumbar radiculopathy 12/08/2018   Myofascial pain 12/08/2018   Impaired gait and mobility 12/08/2018   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   Chronic nonallergic rhinitis 12/29/2016   Mild persistent asthma, uncomplicated 54/10/8117   Vocal fold paralysis, 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  Rationale for Evaluation and Treatment Rehabilitation  SUBJECTIVE:   SUBJECTIVE STATEMENT: "I'm okay"   PAIN:  Are you having pain? No   OBJECTIVE:   TODAY'S TREATMENT:  12-31-21: SLP provided education regarding subsystems of voicing and how they work together optimally to maximize voicing efficacy. SLP demonstrates abdominal breathing vs clavicular breathing and provides usual verbal cues across practice trials to facilitate abdominal breathing. Pt demonstrates with breath only, in 90% of trials. Add in voicing with focus on coordinating exhale with phonation, controlled exhalation and achieving flow with voicing. Pt with 80% accuracy given usual min-A at sustained vowel level. Increase complexity to reading of 3-5 word sentences with pt demonstrating 60% accuracy with usual max-A. Pt with uncontrolled exhale prior to voicing which results in perception of increased tension. Update HEP for practice with abdominal breath work and coordination of breath + phonation.   12-29-21: Over 5 minute discourse sample, pt demonstrating decreased breath support and reporting fatigue following speaking. Perceptually appears to be putting forth more effort  as sample progresses. Pt reports that hoarseness has increased since last time stroboscopy was completed, which per chart review was in 2017 with Dr. Joya Gaskins. Has continued to see Dr. Joya Gaskins for botox injections but denies additional instrumental evaluation of voice. SLP recommends ENT referral to visualize the larynx to determine if there are any additional factors impacting pt's hoarse vocal qualiy aside from vocal tremor. Pt is agreeable to plan, request referral to [facility] to see current ENT provider if possible. At subsequent session, plan to trial flow phonation and continue working on achieving continuous air flow through semi occluded vocal tract exercises with voicing. SLP will also consider candidacy for EMST d/t decreased breath support evidenced.   12-25-21: SLP introduced semi-occluded vocal tract exercises to aid in reduction of persistent hoarseness which accompanies pt's vocal tremor. Following demonstration and direct instruction, achieves continuous air flow into cup of water via drinking straw, evidenced by bubble production, with rare min-A. Demonstrates with min verbal cues 5 trials. Next level of sustained vowel phonation requires consistent  max-A for coordination of voicing, continuous air flow without lateral escape of air with pt able to achieve audible voicing in 2/6 trials. Trials with varying biofeedback (bubbles and feel of air on hand). Update HEP for ongoing trials of sustained vowel with semi-occluded vocal tract.   12-17-21: BSE, vocal assessment and initiated cognitive assessment.    PATIENT EDUCATION: Education details: swallow strategies and water consumption. Person educated: Patient Education method: Explanation Education comprehension: verbalized understanding   GOALS: Goals reviewed with patient? Yes  SHORT TERM GOALS: Target date: 01/05/2022    Pt will support utilizing vocal hygiene strategies and participate in vocal strengthen exercises x1-2 a  week. Baseline: 12/17/21 Goal status: INITIAL  2.  Pt will consume thin liquids via cup and straw using small sips without overt s/s aspiration and only rarely occasional reports of overt s/s at home.  Baseline: 12/17/21 Goal status: INITIAL  3.  Pt will participate in ongoing cognitive assessment (goal will be adjusted after assessment.) Baseline: 12/17/21 Goal status: INITIAL   LONG TERM GOALS: Target date: 01/26/2022    Pt will support utilizing vocal hygiene strategies and participate in vocal strengthen exercises with increased self-improvement score on PROM. Baseline: 10/26 Goal status: INITIAL  2.  Pt will consume thin liquids via cup and straw using small sips without overt s/s aspiration and only rarely occasional reports of overt s/s at home.  Baseline: 10/26 Goal status: INITIAL  3.  Pt will demonstrate improvement in cognitive function (goal adjusted based on continued assessment.) Baseline: 10/26 Goal status: INITIAL  ASSESSMENT:  CLINICAL IMPRESSION: Patient is a 81 y.o. female who was seen today for dystonia, dysphagia and memory deficits. Pt supports continued hoarseness after Botox injection over 4 weeks ago. Pt support occasional s/s aspiration on liquids, requiring use of small sips with cup/straw. Pt denies cognitive deficits, only mild memory impairments due to age but lives alone, responsible for all iADLs and is driving. Pt demonstrated 70 dB volume in conversation and while reading, similar to previous screenings with SLP services, however pt noted exacerbated hoarseness. Pt's oral motor function appeared WFL. Pt demonstrated appropriate containment, mastication, oral clearance, timely swallow and no overt s/s aspiration on regular texture snack thin liquids (via cup, via straw and 3oz YALE swallow test.) SLP attempted to administer cognitive linguistic assessment CLQT, but only completed two subsection due to time restraints. Pt scored 0/10 on symbol cancellation  task and 3/12 on symbol trial, deficits appeared related to memory. SLP will continue cognitive assessment next session. Pt benefit from ST services in order to maximize functional independence.   OBJECTIVE IMPAIRMENTS include attention, memory, voice disorder, and dysphagia. These impairments are limiting patient from managing medications, managing appointments, managing finances, household responsibilities, ADLs/IADLs, effectively communicating at home and in community, and safety when swallowing. Factors affecting potential to achieve goals and functional outcome are ability to learn/carryover information and family/community support.. Patient will benefit from skilled SLP services to address above impairments and improve overall function.  REHAB POTENTIAL: Good  PLAN: SLP FREQUENCY: 2x/week  SLP DURATION: 6 weeks  PLANNED INTERVENTIONS: Aspiration precaution training, Diet toleration management , Cueing hierachy, Cognitive reorganization, Internal/external aids, Functional tasks, SLP instruction and feedback, Compensatory strategies, and Patient/family education    Su Monks, CCC-SLP 12/31/2021, 8:04 AM

## 2021-12-31 NOTE — Patient Instructions (Signed)
Please contact Dr. Joya Gaskins and let him know you are doing Speech Therapy and would like a laryngeal stroboscopy examination to evaluate your ongoing hoarseness

## 2022-01-04 ENCOUNTER — Ambulatory Visit: Payer: Medicare Other | Admitting: Speech Pathology

## 2022-01-04 DIAGNOSIS — R490 Dysphonia: Secondary | ICD-10-CM | POA: Diagnosis not present

## 2022-01-04 DIAGNOSIS — R471 Dysarthria and anarthria: Secondary | ICD-10-CM

## 2022-01-04 DIAGNOSIS — R1310 Dysphagia, unspecified: Secondary | ICD-10-CM | POA: Diagnosis not present

## 2022-01-04 NOTE — Therapy (Signed)
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 2011- no further tx. now- [name] , [name]   Cyst of finger 11/2011   annular cyst right long finger   Dental crowns present    Dermatitis    Frequency of urination    GERD (gastroesophageal reflux disease)    Hemorrhoid    History of colon polyps 2009   Also noted 2012 and 2017 by colonoscopy.   History of shingles 1971   Had right ischial recurrence in the March 2019   Hyperlipidemia    On atorvastatin   Hypertension    under control, has been on med. x 2 yrs.   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 Leta Baptist), neurosurgeon Dr. Arnoldo Morale   PONV (postoperative nausea and vomiting)    Seasonal allergies    Shingles of eyelid 1971   Right eyelid; recurrent -> last episode 05/11/2017   Tremors of nervous system    hands-essential tremor; associated with Parkinson's.   Trigger finger of right hand 11/2011   long finger   Past Surgical History:  Procedure Laterality Date   14-day Zio Patch Monitor  08/2020   (report to be scanned): Predominant SR w/ HR 61 to 142 bpm and average 86 bpm.  Total of 59 episodes of PAT/PSVT  4 to 15 beats (not noted on diary).  Fastest was 5 beats at a rate of 193 bpm, longest was  15 beats at a rate of 106 bpm.  Rare isolated PACs (as well as couplets and triplets) with rare isolated PVCs.  No sustained arrhythmias or bradycardia to explain syncope.   ANTERIOR CERVICAL DECOMPRESSION/DISCECTOMY FUSION 4 LEVELS N/A 11/14/2013   Procedure: ANTERIOR CERVICAL DECOMPRESSION/DISCECTOMY FUSION 4 LEVELS;  Surgeon: Newman Pies, MD;  Location: [facility];  Service: Neurosurgery;  Laterality: N/A;  C34 C45 C56 C67 anterior cervical fusion with interbody prosthesis plating and  bonegraft   APPENDECTOMY  age 52   BREAST LUMPECTOMY  06/16/2009   left; SLN bx.   BREAST LUMPECTOMY  07/01/2009   re-excision   BREAST SURGERY  02/22/1997   reduction   CATARACT EXTRACTION, BILATERAL     COLONOSCOPY WITH PROPOFOL N/A 12/03/2014   Procedure: COLONOSCOPY WITH PROPOFOL;  Surgeon: [name], MD;  Location: [facility];  Service: Endoscopy;  Laterality: N/A;   FOOT SURGERY  06/22/2011   left   KNEE ARTHROSCOPY  03/12/2005   right   KNEE ARTHROSCOPY     left   Lower Extremity Venous Dopplers  12/12/2020   No DVT bilaterally in the deep veins or superficial veins.  No deep or  superficial venous reflux noted bilaterally with exception of Right SSV at the knee.   NASAL SINUS SURGERY     x 2   NEPHRECTOMY LIVING DONOR Left 04/22/2008   donated to spouse 2010([facility])   TRANSTHORACIC ECHOCARDIOGRAM  12/10/2020   EF 60 to 65%.  Normal LV size and function.  No heart WMA.  GR 1 DD.  Normal RV, RVP and RAP.[name]  Normal valves.   TRIGGER FINGER RELEASE  12/16/2011   Procedure: RELEASE TRIGGER FINGER/A-1 PULLEY;  Surgeon: Tennis Must, MD;  Location: [facility];  Service: Orthopedics;  Laterality: Right;  RIGHT LONG FINGER TRIGGER RELEASE & ANNULAR CYST EXCISION   TUMOR EXCISION  age 20   right arm   Patient Active Problem List   Diagnosis Date Noted   Moderate persistent asthma without complication 59/74/7185   PSVT (paroxysmal supraventricular tachycardia) 11/26/2020    Syncope and collapse 11/24/2020   Primary osteoarthritis of right knee 05/12/2020   Primary osteoarthritis of left knee 05/12/2020   Hyperlipidemia    Bilateral calf pain 03/05/2019   Genetic testing 02/13/2019   Family history of ovarian cancer    Family history of bladder cancer    Family history of colon cancer    Family history of leukemia    Lumbar radiculopathy 12/08/2018   Myofascial pain 12/08/2018   Impaired gait and mobility 12/08/2018   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   Chronic nonallergic rhinitis 12/29/2016   Mild persistent asthma, uncomplicated 50/15/8682   Vocal fold paralysis, 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 She started out with a strong voice, but felt her voice got quiet and weak and people were requesting her to repeat. Ongoing training for breath support for speech. We generated 7 sentences/directions she frequently says at the info desk, targeting breath support before each sentence. Instructed her that she will need to breathe more frequently to achieve and maintain volume and intelligibility. Introduced her to the option of a personal amplifier to have to help conserve energy and be understood at [employer]. Next session will continue to target breathing more frequently in longer  utterances such as describing various Sunday school classes. 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 She maintained WNL volume and intelligibility over 45 minute session today with occasional min A  12-31-21: SLP provided education regarding subsystems of voicing and how they work together optimally to maximize voicing efficacy. SLP demonstrates abdominal breathing vs clavicular breathing and provides usual verbal cues across practice trials to facilitate abdominal breathing. Pt demonstrates with breath only, in 90% of trials. Add in voicing with focus on coordinating exhale with phonation, controlled exhalation and achieving flow with voicing. Pt with 80% accuracy given usual min-A at sustained vowel level. Increase complexity to reading of 3-5 word sentences with pt demonstrating 60% accuracy with usual max-A. Pt with uncontrolled exhale prior to voicing which results in  perception of increased tension. Update HEP for practice with abdominal breath work and coordination of breath + phonation.   12-29-21: Over 5 minute discourse sample, pt demonstrating decreased breath support and reporting fatigue following speaking. Perceptually appears to be putting forth more effort as sample progresses. Pt reports that hoarseness has increased since last time stroboscopy was completed, which per chart review was in 2017 with Dr. Joya Gaskins. Has continued to see Dr. Joya Gaskins for botox injections but denies additional instrumental evaluation of voice. SLP recommends ENT referral to visualize the larynx to determine if there are any additional factors impacting pt's hoarse vocal qualiy aside from vocal tremor. Pt is agreeable to plan, request referral to [facility] to see current ENT provider if possible. At subsequent session, plan to trial flow phonation and continue working on achieving continuous air flow through semi occluded vocal tract exercises with voicing. SLP will also consider candidacy for EMST d/t decreased breath  support evidenced.   12-25-21: SLP introduced semi-occluded vocal tract exercises to aid in reduction of persistent hoarseness which accompanies pt's vocal tremor. Following demonstration and direct instruction, achieves continuous air flow into cup of water via drinking straw, evidenced by bubble production, with rare min-A. Demonstrates with min verbal cues 5 trials. Next level of sustained vowel phonation requires consistent max-A for coordination of voicing, continuous air flow without lateral escape of air with pt able to achieve audible voicing in 2/6 trials. Trials with varying biofeedback (bubbles and feel of air on hand). Update HEP for ongoing trials of sustained vowel with semi-occluded vocal tract.   12-17-21: BSE, vocal assessment and initiated cognitive assessment.    PATIENT EDUCATION: Education details: swallow strategies and water consumption. Person educated: Patient Education method: Explanation Education comprehension: verbalized understanding   GOALS: Goals reviewed with patient? Yes  SHORT TERM GOALS: Target date: 01/05/2022    Pt will support utilizing vocal hygiene strategies and participate in vocal strengthen exercises x1-2 a week. Baseline: 12/17/21 Goal status: INITIAL  2.  Pt will consume thin liquids via cup and straw using small sips without overt s/s aspiration and only rarely occasional reports of overt s/s at home.  Baseline: 12/17/21 Goal status: INITIAL  3.  Pt will participate in ongoing cognitive assessment (goal will be adjusted after assessment.) Baseline: 12/17/21 Goal status: INITIAL   LONG TERM GOALS: Target date: 01/26/2022    Pt will support utilizing vocal hygiene strategies and participate in vocal strengthen exercises with increased self-improvement score on PROM. Baseline: 10/26 Goal status: INITIAL  2.  Pt will consume thin liquids via cup and straw using small sips without overt s/s aspiration and only rarely occasional reports of  overt s/s at home.  Baseline: 10/26 Goal status: INITIAL  3.  Pt will demonstrate improvement in cognitive function (goal adjusted based on continued assessment.) Baseline: 10/26 Goal status: INITIAL  ASSESSMENT:  CLINICAL IMPRESSION: Patient is a 81 y.o. female who was seen today for dystonia, dysphagia and memory deficits. Pt supports continued hoarseness after Botox injection over 4 weeks ago. Pt support occasional s/s aspiration on liquids, requiring use of small sips with cup/straw. Pt denies cognitive deficits, only mild memory impairments due to age but lives alone, responsible for all iADLs and is driving. Pt demonstrated 70 dB volume in conversation and while reading, similar to previous screenings with SLP services, however pt noted exacerbated hoarseness. Pt's oral motor function appeared WFL. Pt demonstrated appropriate containment, mastication, oral clearance, timely swallow and no overt s/s aspiration on regular texture snack  thin liquids (via cup, via straw and 3oz YALE swallow test.) SLP attempted to administer cognitive linguistic assessment CLQT, but only completed two subsection due to time restraints. Pt scored 0/10 on symbol cancellation task and 3/12 on symbol trial, deficits appeared related to memory. SLP will continue cognitive assessment next session. Pt benefit from ST services in order to maximize functional independence.   OBJECTIVE IMPAIRMENTS include attention, memory, voice disorder, and dysphagia. These impairments are limiting patient from managing medications, managing appointments, managing finances, household responsibilities, ADLs/IADLs, effectively communicating at home and in community, and safety when swallowing. Factors affecting potential to achieve goals and functional outcome are ability to learn/carryover information and family/community support.. Patient will benefit from skilled SLP services to address above impairments and improve overall  function.  REHAB POTENTIAL: Good  PLAN: SLP FREQUENCY: 2x/week  SLP DURATION: 6 weeks  PLANNED INTERVENTIONS: Aspiration precaution training, Diet toleration management , Cueing hierachy, Cognitive reorganization, Internal/external aids, Functional tasks, SLP instruction and feedback, Compensatory strategies, and Patient/family education    Alice Rieger Annye Rusk, CCC-SLP 01/04/2022, 3:29 PM

## 2022-01-04 NOTE — Patient Instructions (Addendum)
   Personal amplifier to help you be heard in noisy places  [place]: April Church  [phone]  $60  When you get quiet, you need to power your voice with big breath  Have your daughter let you know if you are not intelligible or if she is concerned about your cognition   When you need to be loud, take a big breath. You may have to breathe more frequently that normal to keep your volume up.   Down the hall on your right is the [place] room  Turn the corner past the April Church is the mens  How are you doing today?  Would you like to attend a Sunday school class?  April Church's class is to the right down the hall  The youth department is in the building across the parking lot  The youth department includes middle school and high school students  The children's building is joined to the main building to your left for elementary school children. There is a playground there  The nursery is in the children's building attached to the main building where you register your infant   Practice your visitor desk sentences and directions with a big breath - when your voice feels tired, you need to power your voice with a breath rather than strain from your throat        Get the persons attention before you speak  Use eye contact and face the person you are speaking to  Be in close proximity to the person you are speaking to  Turn down any noise in the environment such as the TV, walk away from loud appliances, air conditioners, fans, dish washers etc  In large gatherings, sit or stay on the side not the center of the room  Try to sit with a wall behind you or in a corner so noise isn't coming at you from all directions when dining out or attending gatherings

## 2022-01-06 ENCOUNTER — Ambulatory Visit: Payer: Medicare Other | Admitting: Speech Pathology

## 2022-01-06 ENCOUNTER — Encounter: Payer: Self-pay | Admitting: Speech Pathology

## 2022-01-06 DIAGNOSIS — R471 Dysarthria and anarthria: Secondary | ICD-10-CM | POA: Diagnosis not present

## 2022-01-06 DIAGNOSIS — R1310 Dysphagia, unspecified: Secondary | ICD-10-CM

## 2022-01-06 DIAGNOSIS — R490 Dysphonia: Secondary | ICD-10-CM | POA: Diagnosis not present

## 2022-01-06 NOTE — Patient Instructions (Signed)
   We have a class for ladies that would like to be in an all ladies class. At this time there is about twenty members. Some have husbands and some don't. I have attended their [place]. It is on the second level in the educational part of the church.  I take the class taught by Adrian Blackwater. Its a small group for my age group. My class is upstairs in the education building  We have 2 classes one for couples and one for singles. They are very large classes. One is 125 people  We have another class that is about 75 people. They just got a new female Pharmacist, hospital. It was [employer] class.  I can walk you to the room if you want me to.

## 2022-01-06 NOTE — Therapy (Signed)
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 2011- no further tx. now- [name] , [name]   Cyst of finger 11/2011   annular cyst right long finger   Dental crowns present    Dermatitis    Frequency of urination    GERD (gastroesophageal reflux disease)    Hemorrhoid    History of colon polyps 2009   Also noted 2012 and 2017 by colonoscopy.   History of shingles 1971   Had right ischial recurrence in the March 2019   Hyperlipidemia    On atorvastatin   Hypertension    under control, has been on med. x 2 yrs.   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 Leta Baptist), neurosurgeon Dr. Arnoldo Morale   PONV (postoperative nausea and vomiting)    Seasonal allergies    Shingles of eyelid 1971   Right eyelid; recurrent -> last episode 05/11/2017   Tremors of nervous system    hands-essential tremor; associated with Parkinson's.   Trigger finger of right hand 11/2011   long finger   Past Surgical History:  Procedure Laterality Date   14-day Zio Patch Monitor  08/2020   (report to be scanned): Predominant SR w/ HR 61 to 142 bpm and average 86 bpm.  Total of 59 episodes of PAT/PSVT  4 to 15 beats (not noted on diary).  Fastest was 5 beats at a rate of  193 bpm, longest was 15 beats at a rate of 106 bpm.  Rare isolated PACs (as well as couplets and triplets) with rare isolated PVCs.  No sustained arrhythmias or bradycardia to explain syncope.   ANTERIOR CERVICAL DECOMPRESSION/DISCECTOMY FUSION 4 LEVELS N/A 11/14/2013   Procedure: ANTERIOR CERVICAL DECOMPRESSION/DISCECTOMY FUSION 4 LEVELS;  Surgeon: Newman Pies, MD;  Location: [facility];  Service: Neurosurgery;  Laterality: N/A;  C34 C45 C56 C67 anterior cervical fusion with interbody prosthesis plating and  bonegraft   APPENDECTOMY  age 40   BREAST LUMPECTOMY  06/16/2009   left; SLN bx.   BREAST LUMPECTOMY  07/01/2009   re-excision   BREAST SURGERY  02/22/1997   reduction   CATARACT EXTRACTION, BILATERAL     COLONOSCOPY WITH PROPOFOL N/A 12/03/2014   Procedure: COLONOSCOPY WITH PROPOFOL;  Surgeon: [name], MD;  Location: [facility];  Service: Endoscopy;  Laterality: N/A;   FOOT SURGERY  06/22/2011   left   KNEE ARTHROSCOPY  03/12/2005   right   KNEE ARTHROSCOPY     left   Lower Extremity Venous Dopplers  12/12/2020   No DVT bilaterally in the deep veins or superficial veins.  No deep or  superficial venous reflux noted bilaterally with exception of Right SSV at the knee.   NASAL SINUS SURGERY     x 2   NEPHRECTOMY LIVING DONOR Left 04/22/2008   donated to spouse 2010([facility])   TRANSTHORACIC ECHOCARDIOGRAM  12/10/2020   EF 60 to 65%.  Normal LV size and function.  No heart WMA.  GR 1 DD.  Normal RV, RVP and RAP.[name]  Normal valves.   TRIGGER FINGER RELEASE  12/16/2011   Procedure: RELEASE TRIGGER FINGER/A-1 PULLEY;  Surgeon: Tennis Must, MD;  Location: [facility];  Service: Orthopedics;  Laterality: Right;  RIGHT LONG FINGER TRIGGER RELEASE & ANNULAR CYST EXCISION   TUMOR EXCISION  age 37   right arm   Patient Active Problem List   Diagnosis Date Noted   Moderate persistent asthma without complication 44/81/8563   PSVT (paroxysmal supraventricular  tachycardia) 11/26/2020   Syncope and collapse 11/24/2020   Primary osteoarthritis of right knee 05/12/2020   Primary osteoarthritis of left knee 05/12/2020   Hyperlipidemia    Bilateral calf pain 03/05/2019   Genetic testing 02/13/2019   Family history of ovarian cancer    Family history of bladder cancer    Family history of colon cancer    Family history of leukemia    Lumbar radiculopathy 12/08/2018   Myofascial pain 12/08/2018   Impaired gait and mobility 12/08/2018   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   Chronic nonallergic rhinitis 12/29/2016   Mild persistent asthma, uncomplicated 14/97/0263   Vocal fold paralysis, 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 Joya Gaskins)   PAIN:  Are you having pain? 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 With frequent mod verbal cues, modeling, and visual cues, she demonstrated WNL volume and no glottal fry. 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 She started out with a strong voice, but felt her voice got quiet and weak and people were requesting her to repeat. Ongoing training for breath support for speech. We generated 7 sentences/directions she frequently says at the info desk, targeting breath support before each sentence. Instructed her that she will need to breathe more frequently to achieve and maintain volume and intelligibility. Introduced her to the option of a personal amplifier to have to help conserve energy and be understood at [employer]. Next session will continue to target breathing more frequently in longer utterances such as describing various Sunday school classes. 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 She maintained WNL volume and intelligibility over 45 minute session today with occasional min A  12-31-21: SLP provided education regarding subsystems of voicing and  how they work together optimally to maximize voicing efficacy. SLP demonstrates abdominal breathing vs clavicular breathing and provides usual verbal cues across practice trials to facilitate abdominal breathing. Pt demonstrates with breath only, in 90% of trials. Add in voicing with focus on coordinating exhale with phonation, controlled exhalation and achieving flow with voicing. Pt with 80% accuracy given usual min-A at sustained vowel level. Increase complexity to reading of 3-5 word sentences with pt demonstrating 60% accuracy with usual max-A. Pt with uncontrolled exhale prior to voicing which results in perception of increased tension. Update HEP for practice with abdominal breath work and coordination of breath + phonation.   12-29-21: Over 5 minute discourse sample, pt demonstrating decreased breath support and reporting fatigue following speaking. Perceptually appears to be putting forth more effort as sample progresses. Pt reports that hoarseness has increased since last time stroboscopy was completed, which per  chart review was in 2017 with Dr. Joya Gaskins. Has continued to see Dr. Joya Gaskins for botox injections but denies additional instrumental evaluation of voice. SLP recommends ENT referral to visualize the larynx to determine if there are any additional factors impacting pt's hoarse vocal qualiy aside from vocal tremor. Pt is agreeable to plan, request referral to [facility] to see current ENT provider if possible. At subsequent session, plan to trial flow phonation and continue working on achieving continuous air flow through semi occluded vocal tract exercises with voicing. SLP will also consider candidacy for EMST d/t decreased breath support evidenced.   12-25-21: SLP introduced semi-occluded vocal tract exercises to aid in reduction of persistent hoarseness which accompanies pt's vocal tremor. Following demonstration and direct instruction, achieves continuous air flow into cup of water via drinking straw, evidenced by bubble production, with rare min-A. Demonstrates with min verbal cues 5 trials. Next level of sustained vowel phonation requires consistent max-A for coordination of voicing, continuous air flow without lateral escape of air with pt able to achieve audible voicing in 2/6 trials. Trials with varying biofeedback (bubbles and feel of air on hand). Update HEP for ongoing trials of sustained vowel with semi-occluded vocal tract.   12-17-21: BSE, vocal assessment and initiated cognitive assessment.    PATIENT EDUCATION: Education details: swallow strategies and water consumption. Person educated: Patient Education method: Explanation Education comprehension: verbalized understanding   GOALS: Goals reviewed with patient? Yes  SHORT TERM GOALS: Target date: 01/05/2022    Pt will support utilizing vocal hygiene strategies and participate in vocal strengthen exercises x1-2 a week. Baseline: 12/17/21 Goal status: INITIAL  2.  Pt will consume thin liquids via cup and straw using small sips without  overt s/s aspiration and only rarely occasional reports of overt s/s at home.  Baseline: 12/17/21 Goal status: INITIAL  3.  Pt will participate in ongoing cognitive assessment (goal will be adjusted after assessment.) Baseline: 12/17/21 Goal status: INITIAL   LONG TERM GOALS: Target date: 01/26/2022    Pt will support utilizing vocal hygiene strategies and participate in vocal strengthen exercises with increased self-improvement score on PROM. Baseline: 10/26 Goal status: INITIAL  2.  Pt will consume thin liquids via cup and straw using small sips without overt s/s aspiration and only rarely occasional reports of overt s/s at home.  Baseline: 10/26 Goal status: INITIAL  3.  Pt will demonstrate improvement in cognitive function (goal adjusted based on continued assessment.) Baseline: 10/26 Goal status: INITIAL  ASSESSMENT:  CLINICAL IMPRESSION: Patient is a 81 y.o. female who was seen today for dystonia, dysphagia and memory deficits. Pt  supports continued hoarseness after Botox injection over 4 weeks ago. Pt support occasional s/s aspiration on liquids, requiring use of small sips with cup/straw. Pt denies cognitive deficits, only mild memory impairments due to age but lives alone, responsible for all iADLs and is driving. Pt demonstrated 70 dB volume in conversation and while reading, similar to previous screenings with SLP services, however pt noted exacerbated hoarseness. Pt's oral motor function appeared WFL. Pt demonstrated appropriate containment, mastication, oral clearance, timely swallow and no overt s/s aspiration on regular texture snack thin liquids (via cup, via straw and 3oz YALE swallow test.) SLP attempted to administer cognitive linguistic assessment CLQT, but only completed two subsection due to time restraints. Pt scored 0/10 on symbol cancellation task and 3/12 on symbol trial, deficits appeared related to memory. SLP will continue cognitive assessment next session. Pt  benefit from ST services in order to maximize functional independence.   OBJECTIVE IMPAIRMENTS include attention, memory, voice disorder, and dysphagia. These impairments are limiting patient from managing medications, managing appointments, managing finances, household responsibilities, ADLs/IADLs, effectively communicating at home and in community, and safety when swallowing. Factors affecting potential to achieve goals and functional outcome are ability to learn/carryover information and family/community support.. Patient will benefit from skilled SLP services to address above impairments and improve overall function.  REHAB POTENTIAL: Good  PLAN: SLP FREQUENCY: 2x/week  SLP DURATION: 6 weeks  PLANNED INTERVENTIONS: Aspiration precaution training, Diet toleration management , Cueing hierachy, Cognitive reorganization, Internal/external aids, Functional tasks, SLP instruction and feedback, Compensatory strategies, and Patient/family education    Alice Rieger Annye Rusk, [place] 01/06/2022, 2:47 PM

## 2022-01-11 ENCOUNTER — Ambulatory Visit: Payer: Medicare Other | Admitting: Speech Pathology

## 2022-01-11 DIAGNOSIS — R490 Dysphonia: Secondary | ICD-10-CM

## 2022-01-11 DIAGNOSIS — R471 Dysarthria and anarthria: Secondary | ICD-10-CM | POA: Diagnosis not present

## 2022-01-11 DIAGNOSIS — R1310 Dysphagia, unspecified: Secondary | ICD-10-CM | POA: Diagnosis not present

## 2022-01-11 NOTE — Therapy (Signed)
OUTPATIENT SPEECH LANGUAGE PATHOLOGY TREATMENT NOTE   Patient Name: April Church MRN: [id number] DOB:[birth year], 81 y.o., female Today's Date: 01/11/2022  PCP: Leeroy Cha REFERRING PROVIDER: Andrey Spearman   End of Session - 01/11/22 1226     Visit Number 7    Number of Visits 12    Date for SLP Re-Evaluation 01/26/22    SLP Start Time 63    SLP Stop Time  1315    SLP Time Calculation (min) 45 min    Activity Tolerance Patient tolerated treatment well                Past Medical History:  Diagnosis Date   Acquired solitary kidney 04/2008   Kidney donor-donated kidney to her husband [name]   Asthma    daily inhaler   Breast cancer ([place]) 05/2009   left- radiation and surgery -dx. 2011- no further tx. now- [name] , [name]   Cyst of finger 11/2011   annular cyst right long finger   Dental crowns present    Dermatitis    Frequency of urination    GERD (gastroesophageal reflux disease)    Hemorrhoid    History of colon polyps 2009   Also noted 2012 and 2017 by colonoscopy.   History of shingles 1971   Had right ischial recurrence in the March 2019   Hyperlipidemia    On atorvastatin   Hypertension    under control, has been on med. x 2 yrs.   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 Leta Baptist), neurosurgeon Dr. Arnoldo Morale   PONV (postoperative nausea and vomiting)    Seasonal allergies    Shingles of eyelid 1971   Right eyelid; recurrent -> last episode 05/11/2017   Tremors of nervous system    hands-essential tremor; associated with Parkinson's.   Trigger finger of right hand 11/2011   long finger   Past Surgical History:  Procedure Laterality Date   14-day Zio Patch Monitor  08/2020   (report to be scanned): Predominant SR w/ HR 61 to 142 bpm and average 86 bpm.  Total of 59 episodes of PAT/PSVT  4 to 15 beats (not noted on diary).  Fastest was 5 beats at a rate  of 193 bpm, longest was 15 beats at a rate of 106 bpm.  Rare isolated PACs (as well as couplets and triplets) with rare isolated PVCs.  No sustained arrhythmias or bradycardia to explain syncope.   ANTERIOR CERVICAL DECOMPRESSION/DISCECTOMY FUSION 4 LEVELS N/A 11/14/2013   Procedure: ANTERIOR CERVICAL DECOMPRESSION/DISCECTOMY FUSION 4 LEVELS;  Surgeon: Newman Pies, MD;  Location: [facility];  Service: Neurosurgery;  Laterality: N/A;  C34 C45 C56 C67 anterior cervical fusion with interbody prosthesis plating and  bonegraft   APPENDECTOMY  age 39   BREAST LUMPECTOMY  06/16/2009   left; SLN bx.   BREAST LUMPECTOMY  07/01/2009   re-excision   BREAST SURGERY  02/22/1997   reduction   CATARACT EXTRACTION, BILATERAL     COLONOSCOPY WITH PROPOFOL N/A 12/03/2014   Procedure: COLONOSCOPY WITH PROPOFOL;  Surgeon: [name], MD;  Location: [facility];  Service: Endoscopy;  Laterality: N/A;   FOOT SURGERY  06/22/2011   left   KNEE ARTHROSCOPY  03/12/2005   right   KNEE ARTHROSCOPY     left   Lower Extremity Venous Dopplers  12/12/2020   No DVT bilaterally in the deep veins or superficial veins.  No deep  or superficial venous reflux noted bilaterally with exception of Right SSV at the knee.   NASAL SINUS SURGERY     x 2   NEPHRECTOMY LIVING DONOR Left 04/22/2008   donated to spouse 2010([facility])   TRANSTHORACIC ECHOCARDIOGRAM  12/10/2020   EF 60 to 65%.  Normal LV size and function.  No heart WMA.  GR 1 DD.  Normal RV, RVP and RAP.[name]  Normal valves.   TRIGGER FINGER RELEASE  12/16/2011   Procedure: RELEASE TRIGGER FINGER/A-1 PULLEY;  Surgeon: Tennis Must, MD;  Location: [facility];  Service: Orthopedics;  Laterality: Right;  RIGHT LONG FINGER TRIGGER RELEASE & ANNULAR CYST EXCISION   TUMOR EXCISION  age 35   right arm   Patient Active Problem List   Diagnosis Date Noted   Moderate persistent asthma without complication 73/41/9379   PSVT (paroxysmal supraventricular  tachycardia) 11/26/2020   Syncope and collapse 11/24/2020   Primary osteoarthritis of right knee 05/12/2020   Primary osteoarthritis of left knee 05/12/2020   Hyperlipidemia    Bilateral calf pain 03/05/2019   Genetic testing 02/13/2019   Family history of ovarian cancer    Family history of bladder cancer    Family history of colon cancer    Family history of leukemia    Lumbar radiculopathy 12/08/2018   Myofascial pain 12/08/2018   Impaired gait and mobility 12/08/2018   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   Chronic nonallergic rhinitis 12/29/2016   Mild persistent asthma, uncomplicated 02/40/9735   Vocal fold paralysis, 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 No   OBJECTIVE:   TODAY'S TREATMENT:   01-11-22: Discussion re: memory changes with pt endorsing no significant changes. Continues to employ external aids to aid in recall of appointments, bills, important events, to-do list, medications, etc. Reports occasionally forgetting unusual prospective memory tasks. SLP provided brief education on use of external aids paired with habits/routine to A in recall. Pt verbalizes understanding. Ongoing education on breath support and pacing of speech to aid in decreased tension and increased intelligibility. SLP led pt through structured practice using oral reading with visual A provided for optimizing breath support. Pt requires usual min-A to aid in pausing at indicated  markers. Overall 80% accuracy in breathing and avoiding glottal fry. Practiced elevating pitch to decrease perception of hoarseness with mild change noted with frequent mod-A for employment of strategy at initiation of utterance.    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 With frequent mod verbal cues, modeling, and visual cues, she demonstrated WNL volume and no glottal fry. In structured task generating 3 sentence descriptions with abdominal breath in between each sentence with frequent mod visual and verbal cues, as well as consistent modeling of abdominal breath support to eliminate glottal fry, maximize intelligibility and volume  PATIENT EDUCATION: Education details: swallow strategies and water consumption. Person educated: Patient Education method: Explanation Education comprehension: verbalized understanding   GOALS: Goals  reviewed with patient? Yes  SHORT TERM GOALS: Target date: 01/05/2022    Pt will support utilizing vocal hygiene strategies and participate in vocal strengthen exercises x1-2 a week. Baseline: 12/17/21 Goal status: INITIAL  2.  Pt will consume thin liquids via cup and straw using small sips without overt s/s aspiration and only rarely occasional reports of overt s/s at home.  Baseline: 12/17/21 Goal status: INITIAL  3.  Pt will participate in ongoing cognitive assessment (goal will be adjusted after assessment.) Baseline: 12/17/21 Goal status: deferred    LONG TERM GOALS: Target date: 01/26/2022    Pt will support utilizing vocal hygiene strategies and participate in vocal strengthen exercises with increased self-improvement score on PROM. Baseline: 10/26 Goal status: INITIAL  2.  Pt will consume thin liquids via cup and straw using small sips without overt s/s aspiration and only rarely occasional reports of overt s/s  at home.  Baseline: 10/26 Goal status: INITIAL  3.  Pt will demonstrate improvement in cognitive function (goal adjusted based on continued assessment.) Baseline: 10/26 Goal status: INITIAL  ASSESSMENT:  CLINICAL IMPRESSION: Patient is a 81 y.o. female who was seen today for dystonia, dysphagia and memory deficits. Pt supports continued hoarseness after Botox injection over 4 weeks ago. Pt support occasional s/s aspiration on liquids, requiring use of small sips with cup/straw. Pt denies cognitive deficits, only mild memory impairments due to age but lives alone, responsible for all iADLs and is driving. Pt demonstrated 70 dB volume in conversation and while reading, similar to previous screenings with SLP services, however pt noted exacerbated hoarseness. Pt's oral motor function appeared WFL. Pt demonstrated appropriate containment, mastication, oral clearance, timely swallow and no overt s/s aspiration on regular texture snack thin liquids (via cup, via straw and 3oz YALE swallow test.) SLP attempted to administer cognitive linguistic assessment CLQT, but only completed two subsection due to time restraints. Pt scored 0/10 on symbol cancellation task and 3/12 on symbol trial, deficits appeared related to memory. SLP will continue cognitive assessment next session. Pt benefit from ST services in order to maximize functional independence.   OBJECTIVE IMPAIRMENTS include attention, memory, voice disorder, and dysphagia. These impairments are limiting patient from managing medications, managing appointments, managing finances, household responsibilities, ADLs/IADLs, effectively communicating at home and in community, and safety when swallowing. Factors affecting potential to achieve goals and functional outcome are ability to learn/carryover information and family/community support.. Patient will benefit from skilled SLP services to address above impairments and improve overall function.  REHAB  POTENTIAL: Good  PLAN: SLP FREQUENCY: 2x/week  SLP DURATION: 6 weeks  PLANNED INTERVENTIONS: Aspiration precaution training, Diet toleration management , Cueing hierachy, Cognitive reorganization, Internal/external aids, Functional tasks, SLP instruction and feedback, Compensatory strategies, and Patient/family education    Su Monks, CCC-SLP 01/11/2022, 12:52 PM

## 2022-01-19 ENCOUNTER — Ambulatory Visit: Payer: Medicare Other | Admitting: Speech Pathology

## 2022-01-19 DIAGNOSIS — R490 Dysphonia: Secondary | ICD-10-CM | POA: Diagnosis not present

## 2022-01-19 DIAGNOSIS — R1310 Dysphagia, unspecified: Secondary | ICD-10-CM | POA: Diagnosis not present

## 2022-01-19 DIAGNOSIS — R471 Dysarthria and anarthria: Secondary | ICD-10-CM | POA: Diagnosis not present

## 2022-01-19 NOTE — Therapy (Signed)
OUTPATIENT SPEECH LANGUAGE PATHOLOGY TREATMENT NOTE   Patient Name: April Church MRN: [id number] DOB:08-14-1940, 81 y.o., female Today's Date: 01/19/2022  PCP: Leeroy Cha REFERRING PROVIDER: Andrey Spearman   End of Session - 01/19/22 1228     Visit Number 8    Number of Visits 12    Date for SLP Re-Evaluation 01/26/22    Progress Note Due on Visit 10    SLP Start Time 20    SLP Stop Time  1311    SLP Time Calculation (min) 41 min    Activity Tolerance Patient tolerated treatment well                 Past Medical History:  Diagnosis Date   Acquired solitary kidney 04/2008   Kidney donor-donated kidney to her 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 2011- no further tx. now- [name] , [name]   Cyst of finger 11/2011   annular cyst right long finger   Dental crowns present    Dermatitis    Frequency of urination    GERD (gastroesophageal reflux disease)    Hemorrhoid    History of colon polyps 2009   Also noted 2012 and 2017 by colonoscopy.   History of shingles 1971   Had right ischial recurrence in the March 2019   Hyperlipidemia    On atorvastatin   Hypertension    under control, has been on med. x 2 yrs.   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 Leta Baptist), neurosurgeon Dr. Arnoldo Morale   PONV (postoperative nausea and vomiting)    Seasonal allergies    Shingles of eyelid 1971   Right eyelid; recurrent -> last episode 05/11/2017   Tremors of nervous system    hands-essential tremor; associated with Parkinson's.   Trigger finger of right hand 11/2011   long finger   Past Surgical History:  Procedure Laterality Date   14-day Zio Patch Monitor  08/2020   (report to be scanned): Predominant SR w/ HR 61 to 142 bpm and average 86 bpm.  Total of 59 episodes of PAT/PSVT  4 to 15 beats (not noted on  diary).  Fastest was 5 beats at a rate of 193 bpm, longest was 15 beats at a rate of 106 bpm.  Rare isolated PACs (as well as couplets and triplets) with rare isolated PVCs.  No sustained arrhythmias or bradycardia to explain syncope.   ANTERIOR CERVICAL DECOMPRESSION/DISCECTOMY FUSION 4 LEVELS N/A 11/14/2013   Procedure: ANTERIOR CERVICAL DECOMPRESSION/DISCECTOMY FUSION 4 LEVELS;  Surgeon: Newman Pies, MD;  Location: [facility];  Service: Neurosurgery;  Laterality: N/A;  C34 C45 C56 C67 anterior cervical fusion with interbody prosthesis plating and  bonegraft   APPENDECTOMY  age 47   BREAST LUMPECTOMY  06/16/2009   left; SLN bx.   BREAST LUMPECTOMY  07/01/2009   re-excision   BREAST SURGERY  02/22/1997   reduction   CATARACT EXTRACTION, BILATERAL     COLONOSCOPY WITH PROPOFOL N/A 12/03/2014   Procedure: COLONOSCOPY WITH PROPOFOL;  Surgeon: [name], MD;  Location: [facility];  Service: Endoscopy;  Laterality: N/A;   FOOT SURGERY  06/22/2011   left   KNEE ARTHROSCOPY  03/12/2005   right   KNEE ARTHROSCOPY     left   Lower Extremity Venous Dopplers  12/12/2020   No DVT bilaterally  in the deep veins or superficial veins.  No deep or superficial venous reflux noted bilaterally with exception of Right SSV at the knee.   NASAL SINUS SURGERY     x 2   NEPHRECTOMY LIVING DONOR Left 04/22/2008   donated to spouse 2010([facility])   TRANSTHORACIC ECHOCARDIOGRAM  12/10/2020   EF 60 to 65%.  Normal LV size and function.  No heart WMA.  GR 1 DD.  Normal RV, RVP and RAP.[name]  Normal valves.   TRIGGER FINGER RELEASE  12/16/2011   Procedure: RELEASE TRIGGER FINGER/A-1 PULLEY;  Surgeon: Tennis Must, MD;  Location: [facility];  Service: Orthopedics;  Laterality: Right;  RIGHT LONG FINGER TRIGGER RELEASE & ANNULAR CYST EXCISION   TUMOR EXCISION  age 36   right arm   Patient Active Problem List   Diagnosis Date Noted   Moderate persistent asthma without complication  46/27/0350   PSVT (paroxysmal supraventricular tachycardia) 11/26/2020   Syncope and collapse 11/24/2020   Primary osteoarthritis of right knee 05/12/2020   Primary osteoarthritis of left knee 05/12/2020   Hyperlipidemia    Bilateral calf pain 03/05/2019   Genetic testing 02/13/2019   Family history of ovarian cancer    Family history of bladder cancer    Family history of colon cancer    Family history of leukemia    Lumbar radiculopathy 12/08/2018   Myofascial pain 12/08/2018   Impaired gait and mobility 12/08/2018   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   Chronic nonallergic rhinitis 12/29/2016   Mild persistent asthma, uncomplicated 09/38/1829   Vocal fold paralysis, 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 No   OBJECTIVE:   TODAY'S TREATMENT:   01-19-22: Pt with usual hoarseness. SLP trial flow phonation and resonant voice therapeutic techniques to aid in reduction of perceptual hoarseness and increase forward focused phonation. No change noted in baseline hoarseness up to phrase level with max-A to include modeling and verbal cues. SLP trials use of intent to aid in clearing of hoarseness with intermittent success noted in generative naming responses (40% of trials). Able to achieve improved vocal clarity with direct model and max-A for use of intent -- being purposeful, projecting voice-- in 4/8 phrases.    01-11-22: Discussion re: memory  changes with pt endorsing no significant changes. Continues to employ external aids to aid in recall of appointments, bills, important events, to-do list, medications, etc. Reports occasionally forgetting unusual prospective memory tasks. SLP provided brief education on use of external aids paired with habits/routine to A in recall. Pt verbalizes understanding. Ongoing education on breath support and pacing of speech to aid in decreased tension and increased intelligibility. SLP led pt through structured practice using oral reading with visual A provided for optimizing breath support. Pt requires usual min-A to aid in pausing at indicated markers. Overall 80% accuracy in breathing and avoiding glottal fry. Practiced elevating pitch to decrease perception of hoarseness with mild change noted with frequent mod-A for employment of strategy at initiation of utterance.   01-06-22:  Targeted breath support in phrases/sentences she says at the volunteer position at church (see pt instructions) Used highlighter to mark visual cues where it breathe. With frequent mod verbal cues, modeling, and visual cues, she demonstrated WNL volume and no glottal fry. In structured task generating 3 sentence descriptions with abdominal breath in between each sentence with frequent mod visual and verbal cues, as well as consistent modeling of abdominal breath support to eliminate glottal fry, maximize intelligibility and volume  PATIENT EDUCATION: Education details: swallow strategies and water consumption. Person educated: Patient Education method: Explanation Education comprehension: verbalized understanding   GOALS: Goals reviewed with patient? Yes  SHORT TERM GOALS: Target date: 01/05/2022    Pt will support utilizing vocal hygiene strategies and participate in vocal strengthen exercises x1-2 a week. Goal status: MET  2.  Pt will consume thin liquids via cup and straw using small sips without overt s/s aspiration and  only rarely occasional reports of overt s/s at home.  Goal status: MET  3.  Pt will participate in ongoing cognitive assessment (goal will be adjusted after assessment.) Goal status: deferred    LONG TERM GOALS: Target date: 01/26/2022    Pt will support utilizing vocal hygiene strategies and participate in vocal strengthen exercises with increased self-improvement score on PROM. Baseline: 10/26 Goal status: INITIAL  2.  Pt will consume thin liquids via cup and straw using small sips without overt s/s aspiration and only rarely occasional reports of overt s/s at home.  Baseline: 10/26 Goal status: INITIAL  3.  Pt will demonstrate improvement in cognitive function (goal adjusted based on continued assessment.) Baseline: 10/26 Goal status: INITIAL  ASSESSMENT:  CLINICAL IMPRESSION: Patient is a 81 y.o. female who was seen today for dystonia, dysphagia and memory deficits. Pt supports continued hoarseness after Botox injection over 4 weeks ago. Pt support occasional s/s aspiration on liquids, requiring use of small sips with cup/straw. Pt denies cognitive deficits, only mild memory impairments due to age but lives alone, responsible for all iADLs and is driving. Pt demonstrated 70 dB volume in conversation and while reading, similar to previous screenings with SLP services, however pt noted exacerbated hoarseness. Pt's oral motor function appeared WFL. Pt demonstrated appropriate containment, mastication, oral clearance, timely swallow and no overt s/s aspiration on regular texture snack thin liquids (via cup, via straw and 3oz YALE swallow test.) SLP attempted to administer cognitive linguistic assessment CLQT, but only completed two subsection due to time restraints. Pt scored 0/10 on symbol cancellation task and 3/12 on symbol trial, deficits appeared related to memory. SLP will continue cognitive assessment next session. Pt benefit from ST services in order to maximize functional  independence.   OBJECTIVE IMPAIRMENTS include attention, memory, voice disorder, and dysphagia. These impairments are limiting patient from managing medications, managing appointments, managing finances, household responsibilities, ADLs/IADLs, effectively communicating at home and in community, and safety when swallowing. Factors affecting potential to achieve goals and functional outcome are ability to learn/carryover information and family/community support.. Patient will benefit from skilled SLP services to address above impairments and improve overall function.  REHAB POTENTIAL: Good  PLAN: SLP FREQUENCY: 2x/week  SLP DURATION: 6 weeks  PLANNED INTERVENTIONS: Aspiration precaution training, Diet toleration management , Cueing hierachy, Cognitive reorganization, Internal/external aids, Functional tasks, SLP instruction and feedback, Compensatory strategies, and Patient/family education    Su Monks, CCC-SLP 01/19/2022, 12:29 PM

## 2022-01-21 ENCOUNTER — Ambulatory Visit: Payer: Medicare Other | Admitting: Speech Pathology

## 2022-01-21 DIAGNOSIS — R1310 Dysphagia, unspecified: Secondary | ICD-10-CM

## 2022-01-21 DIAGNOSIS — R471 Dysarthria and anarthria: Secondary | ICD-10-CM | POA: Diagnosis not present

## 2022-01-21 DIAGNOSIS — R490 Dysphonia: Secondary | ICD-10-CM

## 2022-01-21 NOTE — Therapy (Addendum)
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 2011- no further tx. now- [name] , [name]   Cyst of finger 11/2011   annular cyst right long finger   Dental crowns present    Dermatitis    Frequency of urination    GERD (gastroesophageal reflux disease)    Hemorrhoid    History of colon polyps 2009   Also noted 2012 and 2017 by colonoscopy.   History of shingles 1971   Had right ischial recurrence in the March 2019   Hyperlipidemia    On atorvastatin   Hypertension    under control, has been on med. x 2 yrs.   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 Leta Baptist), neurosurgeon Dr. Arnoldo Morale   PONV (postoperative nausea and vomiting)    Seasonal allergies    Shingles of eyelid 1971   Right eyelid; recurrent -> last episode 05/11/2017   Tremors of nervous system    hands-essential tremor; associated with Parkinson's.   Trigger finger of right hand 11/2011   long finger   Past Surgical History:  Procedure Laterality Date   14-day Zio Patch Monitor  08/2020   (report to be scanned): Predominant SR w/ HR 61 to 142 bpm and average 86 bpm.  Total of 59 episodes of PAT/PSVT  4 to 15 beats (not  noted on diary).  Fastest was 5 beats at a rate of 193 bpm, longest was 15 beats at a rate of 106 bpm.  Rare isolated PACs (as well as couplets and triplets) with rare isolated PVCs.  No sustained arrhythmias or bradycardia to explain syncope.   ANTERIOR CERVICAL DECOMPRESSION/DISCECTOMY FUSION 4 LEVELS N/A 11/14/2013   Procedure: ANTERIOR CERVICAL DECOMPRESSION/DISCECTOMY FUSION 4 LEVELS;  Surgeon: Newman Pies, MD;  Location: [facility];  Service: Neurosurgery;  Laterality: N/A;  C34 C45 C56 C67 anterior cervical fusion with interbody prosthesis plating and  bonegraft   APPENDECTOMY  age 50   BREAST LUMPECTOMY  06/16/2009   left; SLN bx.   BREAST LUMPECTOMY  07/01/2009   re-excision   BREAST SURGERY  02/22/1997   reduction   CATARACT EXTRACTION, BILATERAL     COLONOSCOPY WITH PROPOFOL N/A 12/03/2014   Procedure: COLONOSCOPY WITH PROPOFOL;  Surgeon: [name], MD;  Location: [facility];  Service: Endoscopy;  Laterality: N/A;   FOOT SURGERY  06/22/2011   left   KNEE ARTHROSCOPY  03/12/2005   right   KNEE ARTHROSCOPY     left   Lower Extremity Venous Dopplers  12/12/2020   No DVT  bilaterally in the deep veins or superficial veins.  No deep or superficial venous reflux noted bilaterally with exception of Right SSV at the knee.   NASAL SINUS SURGERY     x 2   NEPHRECTOMY LIVING DONOR Left 04/22/2008   donated to spouse 2010([facility])   TRANSTHORACIC ECHOCARDIOGRAM  12/10/2020   EF 60 to 65%.  Normal LV size and function.  No heart WMA.  GR 1 DD.  Normal RV, RVP and RAP.[name]  Normal valves.   TRIGGER FINGER RELEASE  12/16/2011   Procedure: RELEASE TRIGGER FINGER/A-1 PULLEY;  Surgeon: Tennis Must, MD;  Location: [facility];  Service: Orthopedics;  Laterality: Right;  RIGHT LONG FINGER TRIGGER RELEASE & ANNULAR CYST EXCISION   TUMOR EXCISION  age 82   right arm   Patient Active Problem List   Diagnosis Date Noted   Moderate persistent asthma without complication  16/11/9602   PSVT (paroxysmal supraventricular tachycardia) 11/26/2020   Syncope and collapse 11/24/2020   Primary osteoarthritis of right knee 05/12/2020   Primary osteoarthritis of left knee 05/12/2020   Hyperlipidemia    Bilateral calf pain 03/05/2019   Genetic testing 02/13/2019   Family history of ovarian cancer    Family history of bladder cancer    Family history of colon cancer    Family history of leukemia    Lumbar radiculopathy 12/08/2018   Myofascial pain 12/08/2018   Impaired gait and mobility 12/08/2018   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   Chronic nonallergic rhinitis 12/29/2016   Mild persistent asthma, uncomplicated 54/10/8117   Vocal fold paralysis, 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   Gastroesophageal reflux disease 12/29/2016   Cervical spondylosis with myelopathy and radiculopathy 11/14/2013   Essential tremor 06/13/2013   Cervical spondylosis without myelopathy 06/13/2013   Breast cancer of lower-outer quadrant of left female breast ([place]) 09/10/2010    ONSET DATE: 12/17/21  REFERRING DIAG: Dystonia   THERAPY DIAG:  Dysphonia  Dysarthria and anarthria  Dysphagia, unspecified type  Rationale for Evaluation and Treatment Rehabilitation  SUBJECTIVE:   SUBJECTIVE STATEMENT: Pt had hair appointment yesterday, denies communication breakdowns with hairstylist over long visit    PAIN:  Are you having pain? No   OBJECTIVE:   TODAY'S TREATMENT:   01-21-22: SLP cues to pt recall INTENT as strategy targeted previous session. SLP had to re-educate on definition of intent and how it applies to speech. Target improving vocal quality and increasing intensity through progressively difficulty speech tasks using Speak Out! program, lesson 1. ST leads pt through exercises providing usual model prior to pt execution. usual mod-A required to carryover intentional speech. With cued use of intent, improved vocal clarity with occasional instances of hoarseness. Averages this date:  loud "ah" 74 dB; reading phrases 68 dB; cognitive speech task 68 dB. Education on home practice opportunities to aid in carryover of intentional speech.   01-19-22: Pt with usual hoarseness. SLP trial flow phonation and resonant voice therapeutic techniques to aid in reduction of perceptual hoarseness and increase forward focused phonation. No change noted in baseline hoarseness up to phrase level with max-A to include modeling and verbal cues. SLP trials use of intent to aid in clearing of hoarseness with intermittent success noted in generative naming responses (40% of trials). Able to achieve improved vocal clarity with direct model and max-A for use of intent -- being purposeful, projecting voice-- in 4/8 phrases.    01-11-22: Discussion re: memory changes with pt endorsing no significant changes. Continues to employ external aids to aid  in recall of appointments, bills, important events, to-do list, medications, etc. Reports occasionally forgetting unusual prospective memory tasks. SLP provided brief education on use of external aids paired with habits/routine to A in recall. Pt verbalizes understanding. Ongoing education on breath support and pacing of speech to aid in decreased tension and increased intelligibility. SLP led pt through structured practice using oral reading with visual A provided for optimizing breath support. Pt requires usual min-A to aid in pausing at indicated markers. Overall 80% accuracy in breathing and avoiding glottal fry. Practiced elevating pitch to decrease perception of hoarseness with mild change noted with frequent mod-A for employment of strategy at initiation of utterance.   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 With frequent mod verbal cues, modeling, and visual cues, she demonstrated WNL volume and no glottal fry. In structured task  generating 3 sentence descriptions with abdominal breath in between each sentence with frequent mod visual and verbal cues, as well as consistent modeling of abdominal breath support to eliminate glottal fry, maximize intelligibility and volume  PATIENT EDUCATION: Education details: swallow strategies and water consumption. Person educated: Patient Education method: Explanation Education comprehension: verbalized understanding   GOALS: Goals reviewed with patient? Yes  SHORT TERM GOALS: Target date: 01/05/2022    Pt will support utilizing vocal hygiene strategies and participate in vocal strengthen exercises x1-2 a week. Goal status: MET  2.  Pt will consume thin liquids via cup and straw using small sips without overt s/s aspiration and only rarely occasional reports of overt s/s at home.  Goal status: MET  3.  Pt will participate in ongoing cognitive assessment (goal will be adjusted after assessment.) Goal status: deferred    LONG TERM GOALS: Target date: 01/26/2022    Pt will support utilizing vocal hygiene strategies and participate in vocal strengthen exercises with increased self-improvement score on PROM. Baseline:  Goal status: deferred  2.  Pt will consume thin liquids via cup and straw using small sips without overt s/s aspiration and only rarely occasional reports of overt s/s at home.  Baseline: 01/21/2022 Goal status: MET  3.  Pt will demonstrate improvement in cognitive function (goal adjusted based on continued assessment.) Baseline:  Goal status: REVISED, pt denies cognitive changes requiring assessment  ASSESSMENT:  CLINICAL IMPRESSION: Ms. Esperanza evidences ongoing hoarseness which has intermittent clearing with use of intent. Pt able to demonstrate improved vocal intensity and reduced hoarseness up to reading (paragraph level). Has ongoing HEP to implement intentional speech. Therapy also targeted improved breath support to facilitate reduction of glottal  fry. Tremor and hoarseness persist, pt to f/u with ENT in new year. Denies ongoing swallowing difficulties with use of attention and swallowing strategies. Agreeable to d/c, will be seen back for PD screening in 6 months.   OBJECTIVE IMPAIRMENTS include attention, memory, voice disorder, and dysphagia. These impairments are limiting patient from managing medications, managing appointments, managing finances, household responsibilities, ADLs/IADLs, effectively communicating at home and in community, and safety when swallowing. Factors affecting potential to achieve goals and functional outcome are ability to learn/carryover information and family/community support.. Patient will benefit from skilled SLP services to address above impairments and improve overall function.  REHAB POTENTIAL: Good  PLAN: SLP FREQUENCY: 2x/week  SLP DURATION: 6 weeks  PLANNED INTERVENTIONS: Aspiration precaution training, Diet toleration management , Cueing hierachy, Cognitive reorganization, Internal/external aids, Functional tasks, SLP instruction and feedback, Compensatory  strategies, and Patient/family education  SPEECH THERAPY DISCHARGE SUMMARY  Visits from Start of Care: 9  Current functional level related to goals / functional outcomes: Mild improvement in hoarseness with use of intentional speech, pt with understanding of dysarthria strategies and compensations   Remaining deficits: Dysarthria, vocal tremor   Education / Equipment: Vocal hygiene, dysarthria strategies and compensations, Speak Out, HEP   Patient agrees to discharge. Patient goals were partially met. Patient is being discharged due to being pleased with the current functional level.     Su Monks, [place] 01/21/2022, 12:35 PM

## 2022-01-21 NOTE — Patient Instructions (Signed)
[email or web address]

## 2022-01-26 ENCOUNTER — Encounter: Payer: Self-pay | Admitting: Internal Medicine

## 2022-02-26 ENCOUNTER — Ambulatory Visit: Payer: Medicare Other | Admitting: Internal Medicine

## 2022-03-16 ENCOUNTER — Ambulatory Visit: Payer: Medicare Other | Admitting: Physical Therapy

## 2022-03-16 ENCOUNTER — Ambulatory Visit: Payer: Medicare Other

## 2022-03-16 ENCOUNTER — Ambulatory Visit: Payer: Medicare Other | Admitting: Occupational Therapy

## 2022-03-19 ENCOUNTER — Encounter: Payer: Self-pay | Admitting: Physical Medicine and Rehabilitation

## 2022-03-19 ENCOUNTER — Encounter
Payer: Medicare Other | Attending: Physical Medicine and Rehabilitation | Admitting: Physical Medicine and Rehabilitation

## 2022-03-19 VITALS — BP 132/87 | HR 85 | Temp 98.3°F | Ht 60.0 in | Wt 125.0 lb

## 2022-03-19 DIAGNOSIS — M1711 Unilateral primary osteoarthritis, right knee: Secondary | ICD-10-CM | POA: Insufficient documentation

## 2022-03-19 DIAGNOSIS — M1712 Unilateral primary osteoarthritis, left knee: Secondary | ICD-10-CM | POA: Insufficient documentation

## 2022-03-19 MED ORDER — BETAMETHASONE SOD PHOS & ACET 6 (3-3) MG/ML IJ SUSP
12.0000 mg | Freq: Once | INTRAMUSCULAR | Status: AC
  Administered 2022-03-19: 12 mg via INTRAMUSCULAR

## 2022-03-19 MED ORDER — LIDOCAINE HCL 1 % IJ SOLN
2.0000 mL | Freq: Once | INTRAMUSCULAR | Status: AC
  Administered 2022-03-19: 2 mL

## 2022-03-19 NOTE — Progress Notes (Signed)
Patient is a 82 yr old female with 1 kidney (has R- donated L to husband 10 yrs ago), Parkinson's disease, HTN, hx of Breast CA 9 yrs ago- R breast- s/p lumpectomy; HLD; mild asthma here for  f/u for lumbar radiculopathy. 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 09/18/19 Back injection- R L4-5 transforminal.  epidural steroid injection.  Here for f/u for chronic pain and B/L knee injections.     Celestone last usually ~ 2 months. At most Back been bothering her lately- but tylenol usually works.  Low back on L side- aching.   Plan:  steroid injection was performed at B/L knee steroid injections B/L using 1% plain Lidocaine and 1cc Celestone. This was well tolerated.  Cleaned with betadine x3 and allowed to dry- then alcohol then injected using 27 gauge 1.5 inch needle- no bleeding or complications.    F/U in 3 months for steroid injections of  B/L knee injections  Lidocaine will kick in 15 minutes- and wear off tonight- the steroid will kick in tomorrow within 24 hours and take up to 72 hours to fully kick in.   2. Lidocaine patches 4% over the counter- can leave on for 12 hours; then off for 12 hours- can help the muscle spasm of the paraspinals- can use daily if needed- but don't have to.    3. F/U in 28month- for B/L knee injections with Celestone.

## 2022-03-19 NOTE — Patient Instructions (Signed)
Plan:  steroid injection was performed at B/L knee steroid injections B/L using 1% plain Lidocaine and 1cc Celestone. This was well tolerated.  Cleaned with betadine x3 and allowed to dry- then alcohol then injected using 27 gauge 1.5 inch needle- no bleeding or complications.    F/U in 3 months for steroid injections of  B/L knee injections  Lidocaine will kick in 15 minutes- and wear off tonight- the steroid will kick in tomorrow within 24 hours and take up to 72 hours to fully kick in.   2. Lidocaine patches 4% over the counter- can leave on for 12 hours; then off for 12 hours- can help the muscle spasm of the paraspinals- can use daily if needed- but don't have to.    3. F/U in 30month- for B/L knee injections with Celestone.

## 2022-03-23 DIAGNOSIS — Z1231 Encounter for screening mammogram for malignant neoplasm of breast: Secondary | ICD-10-CM | POA: Diagnosis not present

## 2022-03-25 ENCOUNTER — Encounter: Payer: Self-pay | Admitting: Internal Medicine

## 2022-03-25 ENCOUNTER — Ambulatory Visit (INDEPENDENT_AMBULATORY_CARE_PROVIDER_SITE_OTHER): Payer: Medicare Other | Admitting: Internal Medicine

## 2022-03-25 VITALS — BP 138/80 | HR 75 | Ht 60.0 in | Wt 125.8 lb

## 2022-03-25 DIAGNOSIS — K625 Hemorrhage of anus and rectum: Secondary | ICD-10-CM | POA: Diagnosis not present

## 2022-03-25 DIAGNOSIS — K649 Unspecified hemorrhoids: Secondary | ICD-10-CM

## 2022-03-25 DIAGNOSIS — Z1211 Encounter for screening for malignant neoplasm of colon: Secondary | ICD-10-CM | POA: Diagnosis not present

## 2022-03-25 MED ORDER — HYDROCORTISONE (PERIANAL) 2.5 % EX CREA: TOPICAL_CREAM | CUTANEOUS | 1 refills | Status: DC

## 2022-03-25 NOTE — Progress Notes (Signed)
Chief Complaint: Rectal bleeding  HPI : 82 year old female with history of asthma, breast cancer in remission, GERD, Parkinson disease presents with rectal bleeding  She has had rectal bleeding for years. The bleeding is bright red blood that comes and goes. She wears a pad all the times because she does not know when the rectal bleeding will come on. The blood is when she wipes and seems to be worsening over time. Denies changes in bowel habits or constipation. She will have some diarrhea on occasion. She has on average 2 BMs per day. Denies weight loss. Denies family history of colon cancer. Denies N&V or GERD. She does have a hoarse voice that is attributed to her Parkinson's for which she will sometimes get Botox injections. Sometimes she will have dysphagia for which she follows with a speech therapist. In order to drink liquids, she has to take small sips. She did have a colonoscopy after the bleeding started, which was normal. Denies straining when she has a BM. His OB/GYN thinks that the patient should go for a colonoscopy. Her sister had ovarian cancer.   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 2011- no further tx. now- [name] , [name]   Cyst of finger 11/2011   annular cyst right long finger   Dental crowns present    Dermatitis    Frequency of urination    GERD (gastroesophageal reflux disease)    Hemorrhoid    History of colon polyps 2009   Also noted 2012 and 2017 by colonoscopy.   History of shingles 1971   Had right ischial recurrence in the March 2019   Hyperlipidemia    On atorvastatin   Hypertension    under control, has been on med. x 2  yrs.   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 Leta Baptist), neurosurgeon Dr. Arnoldo Morale   PONV (postoperative nausea and vomiting)    Seasonal allergies    Shingles of eyelid 1971   Right eyelid; recurrent -> last episode 05/11/2017   Tremors of nervous system    hands-essential tremor; associated with Parkinson's.   Trigger finger of right hand 11/2011   long finger     Past Surgical History:  Procedure Laterality Date   14-day Zio Patch Monitor  08/2020   (report to be scanned): Predominant SR w/ HR 61 to 142 bpm and average 86 bpm.  Total of 59 episodes of PAT/PSVT  4 to 15 beats (not noted on diary).  Fastest was 5 beats at a rate of 193 bpm, longest was 15 beats at a rate of 106 bpm.  Rare isolated PACs (as well as couplets and triplets) with rare isolated PVCs.  No sustained arrhythmias or bradycardia to explain syncope.   ANTERIOR CERVICAL DECOMPRESSION/DISCECTOMY FUSION 4 LEVELS N/A 11/14/2013   Procedure: ANTERIOR CERVICAL DECOMPRESSION/DISCECTOMY FUSION 4 LEVELS;  Surgeon: Newman Pies, MD;  Location: [facility];  Service: Neurosurgery;  Laterality: N/A;  C34 C45 C56 C67 anterior cervical fusion with interbody prosthesis plating and  bonegraft   APPENDECTOMY  age 37   BREAST LUMPECTOMY  06/16/2009   left; SLN bx.   BREAST LUMPECTOMY  07/01/2009   re-excision   BREAST SURGERY  02/22/1997   reduction   CATARACT EXTRACTION, BILATERAL     COLONOSCOPY WITH PROPOFOL N/A 12/03/2014   Procedure: COLONOSCOPY WITH PROPOFOL;  Surgeon: [name], MD;  Location: [facility];  Service: Endoscopy;  Laterality: N/A;   FOOT SURGERY  06/22/2011   left   KNEE ARTHROSCOPY  03/12/2005   right   KNEE ARTHROSCOPY     left   Lower Extremity Venous Dopplers  12/12/2020   No DVT bilaterally in the deep veins or superficial veins.  No deep or superficial venous reflux noted bilaterally with exception of Right SSV at the knee.    NASAL SINUS SURGERY     x 2   NEPHRECTOMY LIVING DONOR Left 04/22/2008   donated to spouse 2010([facility])   TRANSTHORACIC ECHOCARDIOGRAM  12/10/2020   EF 60 to 65%.  Normal LV size and function.  No heart WMA.  GR 1 DD.  Normal RV, RVP and RAP.[name]  Normal valves.   TRIGGER FINGER RELEASE  12/16/2011   Procedure: RELEASE TRIGGER FINGER/A-1 PULLEY;  Surgeon: Tennis Must, MD;  Location: [facility];  Service: Orthopedics;  Laterality: Right;  RIGHT LONG FINGER TRIGGER RELEASE & ANNULAR CYST EXCISION   TUMOR EXCISION  age 27   right arm   Family History  Problem Relation Age of Onset   Kidney disease Mother    Stroke Father    Stroke Sister        08/2015   Diabetes Brother    Heart disease Brother    Ovarian cancer Sister 35   Heart disease Paternal Grandmother    Heart disease Paternal Grandfather    Leukemia Other 13       brother's grandson   Bladder Cancer Brother 34   Colon cancer Niece        dx in late 48s   Social History   Tobacco Use   Smoking status: Never   Smokeless tobacco: Never  Vaping Use   Vaping Use: Never used  Substance Use Topics   Alcohol use: No    Alcohol/week: 0.0 standard drinks of alcohol   Drug use: No   Current Outpatient Medications  Medication Sig Dispense Refill   albuterol (VENTOLIN HFA) 108 (90 Base) MCG/ACT inhaler Inhale 1-2 puffs into the lungs every 6 (six) hours as needed for wheezing or shortness of breath. 18 g 1   amLODipine (NORVASC) 2.5 MG tablet Take one tablet each day. 90 tablet 3   atorvastatin (LIPITOR) 20 MG tablet Take 20 mg by mouth daily.     budesonide-formoterol (SYMBICORT) 160-4.5 MCG/ACT inhaler Inhale 2 puffs into the lungs as needed.     CALCIUM CARBONATE PO Take 1 tablet by mouth 2 (two) times daily.     carbidopa-levodopa (SINEMET IR) 25-100 MG tablet Take 2 tablets by mouth 3 (three) times daily. 540 tablet 4   Cholecalciferol (VITAMIN D) 1000 UNITS capsule Take 1,000 Units by mouth 2 (two) times  daily.     cyclobenzaprine (FLEXERIL) 5 MG tablet TAKE 1 TO UP TO 2 TABLETS BY MOUTH ONCE DAILY AT BEDTIME AS NEEDED FOR MUSCLE SPASM 50 tablet 11   entacapone (COMTAN) 200 MG tablet Take 1 tablet (200 mg total) by mouth 3 (three) times daily. 90 tablet 12   EPINEPHrine 0.3 mg/0.3 mL IJ SOAJ injection Inject 0.3 mg into  the muscle as needed. 2 each 1   famotidine (PEPCID) 20 MG tablet Take 1 tablet by mouth once daily 30 tablet 5   fluticasone (FLOVENT HFA) 110 MCG/ACT inhaler 1 puff     hydrocortisone (ANUSOL-HC) 2.5 % rectal cream Procto-Med HC 2.5 % topical cream perineal applicator  APPLY CREAM RECTALLY TO AFFECTED AREA TWICE DAILY     ipratropium (ATROVENT) 0.03 % nasal spray Place 2 sprays into both nostrils 2 (two) times daily. 30 mL 5   levocetirizine (XYZAL) 5 MG tablet TAKE 1 TABLET BY MOUTH ONCE DAILY IN THE EVENING . APPOINTMENT REQUIRED FOR FUTURE REFILLS 30 tablet 4   metoprolol succinate (TOPROL-XL) 25 MG 24 hr tablet Take 1 tablet (25 mg total) by mouth at bedtime. 90 tablet 3   nystatin ointment (MYCOSTATIN) Apply 1 Application topically 2 (two) times daily.     Polyethyl Glycol-Propyl Glycol (SYSTANE OP) Place 1 drop into both eyes 2 (two) times daily.     primidone (MYSOLINE) 50 MG tablet Take 2 tablets (100 mg total) by mouth 3 (three) times daily. 540 tablet 4   traMADol (ULTRAM) 50 MG tablet TAKE 1 TABLET BY MOUTH EVERY 12 HOURS AS NEEDED 60 tablet 5   triamcinolone ointment (KENALOG) 0.1 % Apply 1 Application topically as needed.     Coenzyme Q10 (CO Q 10) 100 MG CAPS Take by mouth. (Patient not taking: Reported on 03/25/2022)     ketotifen (ZADITOR) 0.025 % ophthalmic solution Apply 1 drop to eye daily.     venlafaxine (EFFEXOR) 75 MG tablet Take 1 tablet (75 mg total) by mouth daily. 90 tablet 1   No current facility-administered medications for this visit.   Allergies  Allergen Reactions   Shellfish Allergy Shortness Of Breath   Duloxetine Itching and Rash   Sulfa  Drugs Cross Reactors Anxiety and Rash   Lisinopril     Other reaction(s): cough Other reaction(s): cough Other reaction(s): cough   Lyrica [Pregabalin] Other (See Comments)    "made me drunk, couldn't function"   Other     Other reaction(s): hyper   Sulfa Antibiotics Other (See Comments)    Other reaction(s): hyper, break out in a rash Other reaction(s): hyper, break out in a rash   Codeine Anxiety    Other reaction(s): hyper Other reaction(s): hyper     Review of Systems: All systems reviewed and negative except where noted in HPI.   Physical Exam: BP 138/80   Pulse 75   Ht 5' (1.524 m)   Wt 125 lb 12.8 oz (57.1 kg)   SpO2 98%   BMI 24.57 kg/m  Constitutional: Pleasant,well-developed, female in no acute distress. HEENT: Normocephalic and atraumatic. Conjunctivae are normal. No scleral icterus. Cardiovascular: Normal rate, regular rhythm.  Pulmonary/chest: Effort normal and breath sounds normal. No wheezing, rales or rhonchi. Abdominal: Soft, nondistended, nontender. Bowel sounds active throughout. There are no masses palpable. No hepatomegaly. Rectal: Grade 3 internal hemorrhoid with one column prolapsed and with denuded overlying mucosa Extremities: No edema Neurological: Alert and oriented to person place and time. Skin: Skin is warm and dry. No rashes noted. Psychiatric: Normal mood and affect. Behavior is normal.  Labs 07/30/20: BMP nml.   MR Pelvis w/contrast 10/06/20: IMPRESSION: Multiple small uterine fibroids, largest measuring 2.2 cm. No evidence of pedunculated or intracavitary fibroids. Nonvisualization of ovaries, however no adnexal mass identified. Right-sided sacral insufficiency fracture  05/11/2007 colonoscopy performed with removal of a 4 mm cecal adenomatous polyp.   10/27/2010 colonoscopy performed  with removal of a 4 mm ascending colon tubular adenomatous polyp.   Colonoscopy 12/03/14: Assessment: Normal surveillance colonoscopy   ASSESSMENT AND  PLAN: Rectal bleeding Hemorrhoids History of colon polyps Patient presents with rectal bleeding that has worsened over time. Rectal exam today revealed an irritated prolapsed grade 3 internal hemorrhoid with denuded skin, which is likely the source of her rectal bleeding. Will have her try some Anusol cream to see if this helps with her bleeding. Could consider flex sig or colonoscopy in the future but will try conservative measures first due to the patient's age and comorbidities - Anusol HC cream BID for 7 days - RTC in 6 weeks  Christia Reading, MD  I spent 49 minutes of time, including in depth chart review, independent review of results as outlined above, communicating results with the patient directly, face-to-face time with the patient, coordinating care, ordering studies and medications as appropriate, and documentation.

## 2022-03-25 NOTE — Patient Instructions (Addendum)
If you are age 83 or older, your body mass index should be between 23-30. Your Body mass index is 24.57 kg/m. If this is out of the aforementioned range listed, please consider follow up with your Primary Care Provider. ________________________________________________________  The [facility] providers would like to encourage you to use [facility] to communicate with providers for non-urgent requests or questions.  Due to long hold times on the telephone, sending your provider a message by [facility] may be a faster and more efficient way to get a response.  Please allow 48 business hours for a response.  Please remember that this is for non-urgent requests.  _______________________________________________________  We have sent the following medications to your pharmacy for you to pick up at your convenience:  Anusol HCL cream two times daily for 7 days.  You are scheduled to follow up on 05-12-22 at 9:50am.  Thank you for entrusting me with your care and choosing [facility]  Dr Lorenso Courier

## 2022-03-26 DIAGNOSIS — H04123 Dry eye syndrome of bilateral lacrimal glands: Secondary | ICD-10-CM | POA: Diagnosis not present

## 2022-03-29 DIAGNOSIS — R928 Other abnormal and inconclusive findings on diagnostic imaging of breast: Secondary | ICD-10-CM | POA: Diagnosis not present

## 2022-03-29 DIAGNOSIS — Z853 Personal history of malignant neoplasm of breast: Secondary | ICD-10-CM | POA: Diagnosis not present

## 2022-04-01 ENCOUNTER — Other Ambulatory Visit: Payer: Self-pay

## 2022-04-01 ENCOUNTER — Ambulatory Visit (INDEPENDENT_AMBULATORY_CARE_PROVIDER_SITE_OTHER): Payer: Medicare Other | Admitting: Allergy

## 2022-04-01 ENCOUNTER — Other Ambulatory Visit: Payer: Self-pay | Admitting: Allergy

## 2022-04-01 ENCOUNTER — Encounter: Payer: Self-pay | Admitting: Allergy

## 2022-04-01 VITALS — BP 110/68 | HR 73 | Temp 98.7°F | Resp 16 | Ht 60.0 in | Wt 125.7 lb

## 2022-04-01 DIAGNOSIS — J310 Chronic rhinitis: Secondary | ICD-10-CM | POA: Diagnosis not present

## 2022-04-01 DIAGNOSIS — T781XXD Other adverse food reactions, not elsewhere classified, subsequent encounter: Secondary | ICD-10-CM | POA: Diagnosis not present

## 2022-04-01 DIAGNOSIS — J4540 Moderate persistent asthma, uncomplicated: Secondary | ICD-10-CM | POA: Diagnosis not present

## 2022-04-01 DIAGNOSIS — K219 Gastro-esophageal reflux disease without esophagitis: Secondary | ICD-10-CM | POA: Diagnosis not present

## 2022-04-01 DIAGNOSIS — D485 Neoplasm of uncertain behavior of skin: Secondary | ICD-10-CM | POA: Diagnosis not present

## 2022-04-01 DIAGNOSIS — J3802 Paralysis of vocal cords and larynx, bilateral: Secondary | ICD-10-CM | POA: Diagnosis not present

## 2022-04-01 DIAGNOSIS — L820 Inflamed seborrheic keratosis: Secondary | ICD-10-CM | POA: Diagnosis not present

## 2022-04-01 NOTE — Patient Instructions (Addendum)
Asthma Doing well, under control Continue Symbicort 160/4.5 mcg  2 puffs twice a day with spacer to help prevent cough and wheeze.  Continue albuterol 2 puff into the lungs every 4-6 hours as needed for shortness of breath or wheezing Have access to albuterol inhaler 2 puffs every 4-6 hours as needed for cough/wheeze/shortness of breath/chest tightness.  May use 15-20 minutes prior to activity.   Monitor frequency of use.    Chronic nonallergic rhinitis Continue Xyzal 5 mg once a day as needed for a runny nose.  Continue Atrovent 0.3% use 2 sprays in each nostril twice a day as needed for a runny nose Consider saline nasal rinses as needed for nasal symptoms. Use this before any medicated nasal sprays for best result  Vocal fold paralysis, bilateral Continue routine follow-up with Dr. Joya Gaskins with ENT for periodic Botox injections as directed.   GERD Continue famotidine 20 mg once a day as you have been Continue dietary and lifestyle modifications as listed below  Food allergy Continue avoidance of shellfish and wine  In case of an allergic reaction, take Benadryl 50 mg every 4 hours, and if life-threatening symptoms occur, inject with EpiPen 0.3 mg.  Follow up in 6 months or sooner if needed

## 2022-04-01 NOTE — Progress Notes (Signed)
Follow-up Note  RE: April Church MRN: [id number] DOB: 09/23/1940 Date of Office Visit: 04/01/2022   History of present illness: April Church is a 82 y.o. female presenting today for follow-up of asthma, nonallergic rhinitis, vocal fold paralysis, GERD and food allergy.   She was last seen in the office on 09/25/21 by myself.   She had a spot removed from her forehead today that she states started off as a white looking bump that enlarged.  She also had something frozen on her chest wall at her dermatology visit this morning.  Otherwise she states she has been doing well since last visit without any major health changes, surgeries or hospitalizations.  She feels like her breathing has been doing good.  She has had some wheezing for which she has used her albuterol.  She states may occur average of once a month.  She states she hasn't had any URIs this winter.  She continues to use the symbicort 2 puffs twice a day.  She has not needed any ED/UC visits or systemic steroids.  She states she has had a little bit of a clear runny nose and that's about it. She does use atrovent spray as needed for the runny nose and does feel it helps.  She continues to take xyzal nightly but does miss some nights.   She continues to receive botox injections to her vocal cords.  She also has been doing Education officer, environmental.  She has f/u visit with ENT in April. She denies any reflux symptoms and the pepcid is still effective.   She continues to avoid shellfish and wine and has not had any need to use her epipen.   Review of systems: Review of Systems  Constitutional: Negative.   HENT:  Positive for rhinorrhea and voice change.   Eyes: Negative.   Respiratory:  Positive for wheezing.   Cardiovascular: Negative.   Gastrointestinal: Negative.   Musculoskeletal: Negative.   Skin: Negative.   Allergic/Immunologic: Negative.   Neurological: Negative.      All other systems negative unless noted above in  HPI  Past medical/social/surgical/family history have been reviewed and are unchanged unless specifically indicated below.  No changes  Medication List: Current Outpatient Medications  Medication Sig Dispense Refill   albuterol (VENTOLIN HFA) 108 (90 Base) MCG/ACT inhaler Inhale 1-2 puffs into the lungs every 6 (six) hours as needed for wheezing or shortness of breath. 18 g 1   amLODipine (NORVASC) 2.5 MG tablet Take one tablet each day. 90 tablet 3   atorvastatin (LIPITOR) 20 MG tablet Take 20 mg by mouth daily.     budesonide-formoterol (SYMBICORT) 160-4.5 MCG/ACT inhaler Inhale 2 puffs into the lungs as needed.     CALCIUM CARBONATE PO Take 1 tablet by mouth 2 (two) times daily.     carbidopa-levodopa (SINEMET IR) 25-100 MG tablet Take 2 tablets by mouth 3 (three) times daily. 540 tablet 4   Cholecalciferol (VITAMIN D) 1000 UNITS capsule Take 1,000 Units by mouth 2 (two) times daily.     cyclobenzaprine (FLEXERIL) 5 MG tablet TAKE 1 TO UP TO 2 TABLETS BY MOUTH ONCE DAILY AT BEDTIME AS NEEDED FOR MUSCLE SPASM 50 tablet 11   entacapone (COMTAN) 200 MG tablet Take 1 tablet (200 mg total) by mouth 3 (three) times daily. 90 tablet 12   EPINEPHrine 0.3 mg/0.3 mL IJ SOAJ injection Inject 0.3 mg into the muscle as needed. 2 each 1   famotidine (PEPCID) 20 MG tablet Take  1 tablet by mouth once daily 30 tablet 5   fluticasone (FLOVENT HFA) 110 MCG/ACT inhaler 1 puff     hydrocortisone (ANUSOL-HC) 2.5 % rectal cream Procto-Med HC 2.5 % topical cream perineal applicator  APPLY CREAM RECTALLY TO AFFECTED AREA TWICE DAILY FOR 7 DAYS 30 g 1   ipratropium (ATROVENT) 0.03 % nasal spray Place 2 sprays into both nostrils 2 (two) times daily. 30 mL 5   ketotifen (ZADITOR) 0.025 % ophthalmic solution Apply 1 drop to eye daily.     levocetirizine (XYZAL) 5 MG tablet TAKE 1 TABLET BY MOUTH ONCE DAILY IN THE EVENING . APPOINTMENT REQUIRED FOR FUTURE REFILLS 30 tablet 4   metoprolol succinate (TOPROL-XL) 25 MG  24 hr tablet Take 1 tablet (25 mg total) by mouth at bedtime. 90 tablet 3   nystatin ointment (MYCOSTATIN) Apply 1 Application topically 2 (two) times daily.     Polyethyl Glycol-Propyl Glycol (SYSTANE OP) Place 1 drop into both eyes 2 (two) times daily.     primidone (MYSOLINE) 50 MG tablet Take 2 tablets (100 mg total) by mouth 3 (three) times daily. 540 tablet 4   traMADol (ULTRAM) 50 MG tablet TAKE 1 TABLET BY MOUTH EVERY 12 HOURS AS NEEDED 60 tablet 5   triamcinolone ointment (KENALOG) 0.1 % Apply 1 Application topically as needed.     venlafaxine (EFFEXOR) 75 MG tablet Take 1 tablet (75 mg total) by mouth daily. 90 tablet 1   No current facility-administered medications for this visit.     Known medication allergies: Allergies  Allergen Reactions   Shellfish Allergy Shortness Of Breath   Duloxetine Itching and Rash   Sulfa Drugs Cross Reactors Anxiety and Rash   Lisinopril     Other reaction(s): cough Other reaction(s): cough Other reaction(s): cough   Lyrica [Pregabalin] Other (See Comments)    "made me drunk, couldn't function"   Other     Other reaction(s): hyper   Sulfa Antibiotics Other (See Comments)    Other reaction(s): hyper, break out in a rash Other reaction(s): hyper, break out in a rash   Codeine Anxiety    Other reaction(s): hyper Other reaction(s): hyper     Physical examination: Blood pressure 110/68, pulse 73, temperature 98.7 F (37.1 C), temperature source Temporal, resp. rate 16, height 5' (1.524 m), weight 125 lb 11.2 oz (57 kg), SpO2 96 %.  General: Alert, interactive, in no acute distress. HEENT: PERRLA, TMs pearly gray, turbinates minimally edematous without discharge, post-pharynx non erythematous. Neck: Supple without lymphadenopathy. Lungs: Clear to auscultation without wheezing, rhonchi or rales. {no increased work of breathing. CV: Normal S1, S2 without murmurs. Abdomen: Nondistended, nontender. Skin: Warm and dry, without lesions or  rashes, round bandage on left temple area Extremities:  No clubbing, cyanosis or edema. Neuro:   Grossly intact.  Diagnositics/Labs:  Spirometry: FEV1: 1.2L 73%, FVC: 1.57L 73% predicted.  This is normal study for demographics  Assessment and plan:   Asthma Doing well, under control Continue Symbicort 160/4.5 mcg  2 puffs twice a day with spacer to help prevent cough and wheeze.  Continue albuterol 2 puff into the lungs every 4-6 hours as needed for shortness of breath or wheezing Have access to albuterol inhaler 2 puffs every 4-6 hours as needed for cough/wheeze/shortness of breath/chest tightness.  May use 15-20 minutes prior to activity.   Monitor frequency of use.    Chronic nonallergic rhinitis Continue Xyzal 5 mg once a day as needed for a runny nose.  Continue  Atrovent 0.3% use 2 sprays in each nostril twice a day as needed for a runny nose Consider saline nasal rinses as needed for nasal symptoms. Use this before any medicated nasal sprays for best result  Vocal fold paralysis, bilateral Continue routine follow-up with Dr. Joya Gaskins with ENT for periodic Botox injections as directed.   GERD Continue famotidine 20 mg once a day as you have been Continue dietary and lifestyle modifications as listed below  Food allergy Continue avoidance of shellfish and wine  In case of an allergic reaction, take Benadryl 50 mg every 4 hours, and if life-threatening symptoms occur, inject with EpiPen 0.3 mg.  Follow up in 6 months or sooner if needed   I appreciate the opportunity to take part in Edison's care. Please do not hesitate to contact me with questions.  Sincerely,   Prudy Feeler, MD Allergy/Immunology [facility]

## 2022-04-05 ENCOUNTER — Other Ambulatory Visit: Payer: Self-pay | Admitting: Allergy

## 2022-04-06 DIAGNOSIS — Z13820 Encounter for screening for osteoporosis: Secondary | ICD-10-CM | POA: Diagnosis not present

## 2022-04-13 ENCOUNTER — Ambulatory Visit (INDEPENDENT_AMBULATORY_CARE_PROVIDER_SITE_OTHER): Payer: Medicare Other | Admitting: Diagnostic Neuroimaging

## 2022-04-13 ENCOUNTER — Encounter: Payer: Self-pay | Admitting: Diagnostic Neuroimaging

## 2022-04-13 VITALS — BP 156/100 | HR 81 | Ht 60.0 in | Wt 127.0 lb

## 2022-04-13 DIAGNOSIS — G250 Essential tremor: Secondary | ICD-10-CM

## 2022-04-13 DIAGNOSIS — G20A1 Parkinson's disease without dyskinesia, without mention of fluctuations: Secondary | ICD-10-CM

## 2022-04-13 MED ORDER — ENTACAPONE 200 MG PO TABS: 200.0000 mg | ORAL_TABLET | Freq: Three times a day (TID) | ORAL | 12 refills | Status: DC

## 2022-04-13 MED ORDER — CARBIDOPA-LEVODOPA 25-100 MG PO TABS: 2.0000 | ORAL_TABLET | Freq: Three times a day (TID) | ORAL | 4 refills | Status: DC

## 2022-04-13 NOTE — Progress Notes (Signed)
[facility]  PATIENT: April Church DOB: 16-May-1940  REFERRING CLINICIAN: Leeroy Cha,*  HISTORY FROM: patient REASON FOR VISIT: follow up    HISTORICAL  CHIEF COMPLAINT:  Chief Complaint  Patient presents with   Follow-up    Patient in room #7 and alone. Pt here today to f/u on her Parkinson's disease.    HISTORY OF PRESENT ILLNESS:   UPDATE (04/13/22, VRP): Since last visit, doing about the same. Symptoms are stable. Severity is moderate. No alleviating or aggravating factors. Tolerating meds.  Entacapone helping (taking once a day).   UPDATE (10/13/21, VRP): Since last visit, doing about the same. New onset left leg swelling x 2 days. No triggers. Tolerating carb/levo, but wearing off after 1-2 hours. Still with knee pain and back pain issues. Gait diff continues.   UPDATE (12/23/20, VRP): Since last visit, still having leg pain (calves, aching pain). Now back in water exercises. Had BLE u/s, neg for DVT. Completed PT. PD stable.   UPDATE (10/13/20, VRP): Since last visit, tremor stable. Had 1 major fall 1 month ago; patient not sure about LOC. Getting heart workup. Increased primidone not helping. Seeing pain mgmt for back issues and leg pain issues.  UPDATE (10/10/19, VRP): Since last visit, tremor slightly progressing. Symptoms are moderate. Severity is moderate. No alleviating or aggravating factors. Tolerating medications.  Lumbar spine injections helping with low back pain.  UPDATE (01/09/19, VRP): Since last visit, doing well. Symptoms are stable. Severity is moderate. No alleviating or aggravating factors. Tolerating carb/levo 2 tabs three times a day. Planning for pain mgmt injections soon for low back pain.     UPDATE (01/31/18, VRP): Since last visit, doing about the same, except slightly more cramps and pain in arms and back. No alleviating or aggravating factors. Tolerating carb/levo.    UPDATE (05/23/17, VRP): Since last visit,  doing well. Tolerating meds. No alleviating or aggravating factors. Tremor slightly worse. Some memory issues. Taking care of own ADLs. No major changes.  UPDATE 11/15/16: Since last visit, overall doing well. Tremor stable. Some memory loss issues continue, but not affecting ADLs. Tolerating meds. No falls. 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   UPDATE 05/10/16: Since last visit, stable. Some mild tremors in left hand. Mild balance issues. Mild memory issues. Cyclobenz helps with night time spasms.   UPDATE 11/12/15: Since last visit, tremors are stable. Balance stable. Now with night time neck, shoulder, spine and leg pain (spasm). Had PT and OT evals as well.   UPDATE 05/13/15: Since last visit, tremor in arms better. Had ST and botox for voice issues. Some wearing off of meds before mid-day dosing. No neck pain issues.   UPDATE 02/12/15: Since last visit, tremor is stable. More voice strangulation issues. Tolerating primidone 1110m TID.  UPDATE 08/13/14: Since last visit, tried primidone 2540mBID, but not much tremor benefit; also having more sleepiness in the day time. Still with hand > voice > chin tremor. No falls. Balance getting slightly worse.  UPDATE 05/22/14: Since last visit, tremor progressing. Now notes new resting tremor in the last year. Some balance and walking issues. She is s/p cervical decompression in Sept 2015, with good results.   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 Her tremor does not limit any of her activities of daily life. She denies any side effects of the Mysoline. She also reports today that she's had some  numbness and tingling down her right arm from her neck and shoulder. It has been bothering her more in the last 6 months. 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 She saw Dr. Arnoldo Morale back in 2004.  She returns for  reevaluation  UPDATE 06/07/12 (VRP): Doing well. BUE tremor is stable and controlled. Voice tremor is slightly progressing. Tolerating primidone.  PRIOR HPI (06/09/11, VRP):  82 year old female returns for F/U. Last seen 06/09/10.  She has been followed in this office for many years for essential tremor. She takes primidone 50 mg, 1.5 TID. She tolerates it well. She comes in today saying that her tremor is a little worse before lunch.  The tremor limits her a little bit with eating and writing but for the most part she is happy with her level of function.She has hx of breast cancer.  No neurologic complaints.    REVIEW OF SYSTEMS: Full 14 system review of systems performed and negative except: as per HPI.    ALLERGIES: Allergies  Allergen Reactions   Shellfish Allergy Shortness Of Breath   Duloxetine Itching and Rash   Sulfa Drugs Cross Reactors Anxiety and Rash   Lisinopril     Other reaction(s): cough Other reaction(s): cough Other reaction(s): cough   Lyrica [Pregabalin] Other (See Comments)    "made me drunk, couldn't function"   Other     Other reaction(s): hyper   Sulfa Antibiotics Other (See Comments)    Other reaction(s): hyper, break out in a rash Other reaction(s): hyper, break out in a rash   Codeine Anxiety    Other reaction(s): hyper Other reaction(s): hyper    HOME MEDICATIONS: Outpatient Medications Prior to Visit  Medication Sig Dispense Refill   albuterol (VENTOLIN HFA) 108 (90 Base) MCG/ACT inhaler Inhale 1-2 puffs into the lungs every 6 (six) hours as needed for wheezing or shortness of breath. 18 g 1   amLODipine (NORVASC) 2.5 MG tablet Take one tablet each day. 90 tablet 3   atorvastatin (LIPITOR) 20 MG tablet Take 20 mg by mouth daily.     budesonide-formoterol (SYMBICORT) 160-4.5 MCG/ACT inhaler Inhale 2 puffs into the lungs as needed.     CALCIUM CARBONATE PO Take 1 tablet by mouth 2 (two) times daily.     Cholecalciferol (VITAMIN D) 1000 UNITS capsule  Take 1,000 Units by mouth 2 (two) times daily.     cyclobenzaprine (FLEXERIL) 5 MG tablet TAKE 1 TO UP TO 2 TABLETS BY MOUTH ONCE DAILY AT BEDTIME AS NEEDED FOR MUSCLE SPASM 50 tablet 11   EPINEPHrine 0.3 mg/0.3 mL IJ SOAJ injection Inject 0.3 mg into the muscle as needed. 2 each 1   famotidine (PEPCID) 20 MG tablet Take 1 tablet by mouth once daily 30 tablet 5   fluticasone (FLOVENT HFA) 110 MCG/ACT inhaler 1 puff     hydrocortisone (ANUSOL-HC) 2.5 % rectal cream Procto-Med HC 2.5 % topical cream perineal applicator  APPLY CREAM RECTALLY TO AFFECTED AREA TWICE DAILY FOR 7 DAYS 30 g 1   ipratropium (ATROVENT) 0.03 % nasal spray Place 2 sprays into both nostrils 2 (two) times daily. 30 mL 5   ketotifen (ZADITOR) 0.025 % ophthalmic solution Apply 1 drop to eye daily.     levocetirizine (XYZAL) 5 MG tablet TAKE 1 TABLET BY MOUTH ONCE DAILY IN THE EVENING . APPOINTMENT REQUIRED FOR FUTURE REFILLS 30 tablet 0   metoprolol succinate (TOPROL-XL) 25 MG 24 hr tablet Take 1  tablet (25 mg total) by mouth at bedtime. 90 tablet 3   nystatin ointment (MYCOSTATIN) Apply 1 Application topically 2 (two) times daily.     Polyethyl Glycol-Propyl Glycol (SYSTANE OP) Place 1 drop into both eyes 2 (two) times daily.     primidone (MYSOLINE) 50 MG tablet Take 2 tablets (100 mg total) by mouth 3 (three) times daily. 540 tablet 4   traMADol (ULTRAM) 50 MG tablet TAKE 1 TABLET BY MOUTH EVERY 12 HOURS AS NEEDED 60 tablet 5   triamcinolone ointment (KENALOG) 0.1 % Apply 1 Application topically as needed.     venlafaxine (EFFEXOR) 75 MG tablet Take 1 tablet (75 mg total) by mouth daily. 90 tablet 1   carbidopa-levodopa (SINEMET IR) 25-100 MG tablet Take 2 tablets by mouth 3 (three) times daily. 540 tablet 4   entacapone (COMTAN) 200 MG tablet Take 1 tablet (200 mg total) by mouth 3 (three) times daily. 90 tablet 12   No facility-administered medications prior to visit.    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 2011- no further tx. now- [name] , [name]   Cyst of finger 11/2011   annular cyst right long finger   Dental crowns present    Dermatitis    Frequency of urination    GERD (gastroesophageal reflux disease)    Hemorrhoid    History of colon polyps 2009   Also noted 2012 and 2017 by colonoscopy.   History of shingles 1971   Had right ischial recurrence in the March 2019   Hyperlipidemia    On atorvastatin   Hypertension    under control, has been on med. x 2 yrs.   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 Leta Baptist), neurosurgeon Dr. Arnoldo Morale   PONV (postoperative nausea and vomiting)    Seasonal allergies    Shingles of eyelid 1971   Right eyelid; recurrent -> last episode 05/11/2017   Tremors of nervous system    hands-essential tremor; associated with Parkinson's.   Trigger finger of right hand 11/2011   long finger    PAST SURGICAL HISTORY: Past Surgical History:  Procedure Laterality Date   14-day Zio Patch Monitor  08/2020   (report to be scanned): Predominant SR w/ HR 61 to 142 bpm and average 86 bpm.  Total of 59 episodes of PAT/PSVT  4 to 15 beats (not noted on diary).  Fastest was 5 beats at a rate of 193 bpm, longest was 15 beats at a rate of 106 bpm.  Rare isolated PACs (as well as couplets and triplets) with rare isolated PVCs.  No sustained arrhythmias or bradycardia to explain syncope.   ANTERIOR CERVICAL DECOMPRESSION/DISCECTOMY FUSION 4 LEVELS N/A 11/14/2013   Procedure: ANTERIOR CERVICAL DECOMPRESSION/DISCECTOMY FUSION 4 LEVELS;  Surgeon: Newman Pies, MD;  Location: [facility];  Service: Neurosurgery;  Laterality: N/A;  C34 C45 C56 C67 anterior cervical fusion with interbody prosthesis plating  and  bonegraft   APPENDECTOMY  age 45   BREAST LUMPECTOMY  06/16/2009   left; SLN bx.   BREAST LUMPECTOMY  07/01/2009   re-excision   BREAST SURGERY  02/22/1997   reduction   CATARACT EXTRACTION, BILATERAL     COLONOSCOPY WITH PROPOFOL  N/A 12/03/2014   Procedure: COLONOSCOPY WITH PROPOFOL;  Surgeon: [name], MD;  Location: [facility];  Service: Endoscopy;  Laterality: N/A;   FOOT SURGERY  06/22/2011   left   KNEE ARTHROSCOPY  03/12/2005   right   KNEE ARTHROSCOPY     left   Lower Extremity Venous Dopplers  12/12/2020   No DVT bilaterally in the deep veins or superficial veins.  No deep or superficial venous reflux noted bilaterally with exception of Right SSV at the knee.   NASAL SINUS SURGERY     x 2   NEPHRECTOMY LIVING DONOR Left 04/22/2008   donated to spouse 2010([facility])   TRANSTHORACIC ECHOCARDIOGRAM  12/10/2020   EF 60 to 65%.  Normal LV size and function.  No heart WMA.  GR 1 DD.  Normal RV, RVP and RAP.[name]  Normal valves.   TRIGGER FINGER RELEASE  12/16/2011   Procedure: RELEASE TRIGGER FINGER/A-1 PULLEY;  Surgeon: Tennis Must, MD;  Location: [facility];  Service: Orthopedics;  Laterality: Right;  RIGHT LONG FINGER TRIGGER RELEASE & ANNULAR CYST EXCISION   TUMOR EXCISION  age 23   right arm    FAMILY HISTORY: Family History  Problem Relation Age of Onset   Kidney disease Mother    Stroke Father    Stroke Sister        08/2015   Diabetes Brother    Heart disease Brother    Ovarian cancer Sister 55   Heart disease Paternal Grandmother    Heart disease Paternal Grandfather    Leukemia Other 31       brother's grandson   Bladder Cancer Brother 54   Colon cancer Niece        dx in late 98s    SOCIAL HISTORY:  Social History   Socioeconomic History   Marital status: Widowed    Spouse name: [name]-David   Number of children: 1   Years of education: HS   Highest education level: Not on file  Occupational History   Occupation:  Retired   Occupation: Housewife  Tobacco Use   Smoking status: Never   Smokeless tobacco: Never  Vaping Use   Vaping Use: Never used  Substance and Sexual Activity   Alcohol use: No    Alcohol/week: 0.0 standard drinks of alcohol   Drug use: No   Sexual activity: Not Currently  Other Topics Concern   Not on file  Social History Narrative   10/13/20    Recently widowed-lives alone, husband [name] passed 04/26/2020    Patient has 1 daughter who lives in [place], [place]-> 1 grandson who lives in [place]   Patient has a HS education.    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    Patient is right handed.    Patient drinks caffeine occasionally.     Social Determinants of Health   Financial Resource Strain: Not on file  Food Insecurity: Not on file  Transportation Needs: Not on file  Physical Activity: Not on file  Stress: Not on file  Social Connections: Not on file  Intimate Partner Violence: Not on file    PHYSICAL EXAM  Vitals:   04/13/22 1453  BP: (!) 156/100  Pulse: 81  Weight: 127 lb (57.6 kg)  Height: 5' (1.524 m)   Body mass index is 24.8 kg/m.  GENERAL EXAM: Patient is in no distress  CARDIOVASCULAR: Regular rate and rhythm, no murmurs, no carotid bruits  NEUROLOGIC:  MENTAL STATUS: awake, alert, language fluent, comprehension intact, naming intact; MASKED FACIES; NEG FRONTAL RELEASE SIGNS    05/23/2017    3:34 PM  MMSE - Mini Mental State Exam  Orientation to time 5  Orientation to Place 5  Registration 3  Attention/ Calculation 1  Recall 2  Language- name 2 objects 2  Language- repeat 1  Language- follow 3 step command 3  Language- read & follow direction 1  Write a sentence 1  Copy design 1  Total score 25    CRANIAL NERVE: pupils equal and reactive to light, visual fields full to confrontation, extraocular muscles intact, no nystagmus, facial sensation and strength symmetric, uvula midline, shoulder shrug  symmetric, tongue midline; SOFT HOARSE VOICE MOTOR: normal bulk, POSTURAL AND ACTION TREMOR; RARE REST TREMOR IN RUE > LUE; INT MOUTH TREMOR; BRADYKINESIA IN BUE AND BLE; LEFT SLOWER THAN RIGHT; MILD COGWHEELING IN RUE > LUE; full strength in the BUE, BLE; VOICE TREMOR SENSORY: normal and symmetric to light touch COORDINATION: finger-nose-finger, fine finger movements normal REFLEXES: deep tendon reflexes present and symmetric GAIT/STATION: narrow based gait; SLOW AND CAUTIOUS, DECR RIGHT ARM SWING    DIAGNOSTIC DATA (LABS, IMAGING, TESTING) - I reviewed patient records, labs, notes, testing and imaging myself where available.  Lab Results  Component Value Date   WBC 5.2 01/20/2015   HGB 12.0 01/20/2015   HCT 36.1 01/20/2015   MCV 88.4 01/20/2015   PLT 293 01/20/2015      Component Value Date/Time   NA 142 07/30/2020 1141   NA 136 01/20/2015 1326   K 4.5 07/30/2020 1141   K 4.1 01/20/2015 1326   CL 100 07/30/2020 1141   CL 102 06/30/2012 1231   CO2 25 07/30/2020 1141   CO2 27 01/20/2015 1326   GLUCOSE 89 07/30/2020 1141   GLUCOSE 66 (L) 01/20/2015 1326   GLUCOSE 77 06/30/2012 1231   BUN 11 07/30/2020 1141   BUN 13.9 01/20/2015 1326   CREATININE 0.65 07/30/2020 1141   CREATININE 0.9 01/20/2015 1326   CALCIUM 9.1 07/30/2020 1141   CALCIUM 8.8 01/20/2015 1326   PROT 7.4 01/20/2015 1326   ALBUMIN 3.6 01/20/2015 1326   AST 17 01/20/2015 1326   ALT 13 01/20/2015 1326   ALKPHOS 86 01/20/2015 1326   BILITOT <0.30 01/20/2015 1326   GFRNONAA 83 (L) 11/06/2013 1032   GFRAA >90 11/06/2013 1032   No results found for: "CHOL", "HDL", "LDLCALC", "LDLDIRECT", "TRIG", "CHOLHDL" No results found for: "HGBA1C" No results found for: "VITAMINB12" No results found for: "TSH"   MRI brain - unremarkable per patient (done ~ early 2000's)  06/25/13 EMG/NCS 1. Possible right C7 radiculopathy, with chronic denervation changes in the right triceps muscle and spontaneous activity in the  right C6-7 paraspinal muscles. 2. No evidence of widespread underlying large fiber neuropathy.  11/14/13 CXR [I reviewed images myself and agree with interpretation. -VRP]  - Anterior cervical disc fusion localization images as described.  10/11/18 MRI lumbar spine  MRI lumbar spine (without) demonstrating: - At L4-5: disc bulging and facet hypertrophy with severe right and moderate left foraminal stenosis. - At L3-4: disc bulging and facet hypertrophy with moderate right and mild left foraminal stenosis. - At L5-S1: disc bulging and facet hypertrophy with mild right and moderate left foraminal stenosis.    ASSESSMENT AND PLAN  82 y.o. 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 here with essential tremor since 1990's. Now with intermittent resting tremor AND bradykinesia since 2016. May represent essential tremor with superimposed parkinson's disease + cervical myelopathy sequelae. DATscan ordered but denied (to confirm superimposed parkinsonism on top of essential tremor and cervical myelopathy).  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)  Parkinson's disease without dyskinesia or fluctuating manifestations  Essential tremor     PLAN:  PARKINSONISM  - continue carb/levo 2 tabs three times a day; plus entacapone 288m to each dose due to wearing off - consider rasagiline, nourianz or rytary in future - caution with walking and balance;  use cane / walker as needed; refer to PT for gait / balance training - [facility], patient holding off for now due to some concern of potential complications  LEFT LEG SWELLING (2 days; slightly in right leg) - lower ext u/s  LEG PAIN (aching, knees, calves) - due to lumbar radiculopathies, arthritis (knees), deconditioning, parkinson's dz - continue PT exercises; [facility]  ESSENTIAL TREMOR - continue primidone to 1064mthree times a day - consider DBS as above  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    Dispense:  540 tablet    Refill:  4   entacapone (COMTAN) 200 MG tablet    Sig: Take 1 tablet (200 mg total) by mouth 3 (three) times daily.    Dispense:  90 tablet    Refill:  12   Return in about 1 year (around 04/14/2023).     VIPenni BombardMD [id number] Certified in Neurology, Neurophysiology and Neuroimaging  [facility] [address] [id number]

## 2022-04-16 ENCOUNTER — Ambulatory Visit: Payer: Medicare Other | Attending: Diagnostic Neuroimaging | Admitting: Physical Therapy

## 2022-04-16 DIAGNOSIS — R2681 Unsteadiness on feet: Secondary | ICD-10-CM | POA: Diagnosis not present

## 2022-04-16 DIAGNOSIS — G20A1 Parkinson's disease without dyskinesia, without mention of fluctuations: Secondary | ICD-10-CM | POA: Diagnosis not present

## 2022-04-16 NOTE — Therapy (Signed)
OUTPATIENT PHYSICAL THERAPY NEURO EVALUATION   Patient Name: April Church MRN: [id number] DOB:[birth year], 82 y.o., female Today's Date: 04/16/2022   PCP: Leeroy Cha, MD  REFERRING PROVIDER: Penni Bombard, MD   END OF SESSION:  PT End of Session - 04/16/22 1412     Visit Number 1    Number of Visits 1    Authorization Type Medicare A & B    PT Start Time 1404    PT Stop Time 1455    PT Time Calculation (min) 51 min    Equipment Utilized During Treatment --    Activity Tolerance Patient tolerated treatment well    Behavior During Therapy WFL for tasks assessed/performed             Past Medical History:  Diagnosis Date   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 2011- no further tx. now- [name] , [name]   Cyst of finger 11/2011   annular cyst right long finger   Dental crowns present    Dermatitis    Frequency of urination    GERD (gastroesophageal reflux disease)    Hemorrhoid    History of colon polyps 2009   Also noted 2012 and 2017 by colonoscopy.   History of shingles 1971   Had right ischial recurrence in the March 2019   Hyperlipidemia    On atorvastatin   Hypertension    under control, has been on med. x 2 yrs.   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 Leta Baptist), neurosurgeon Dr. Arnoldo Morale   PONV (postoperative nausea and vomiting)    Seasonal allergies    Shingles of eyelid 1971   Right eyelid; recurrent -> last episode 05/11/2017   Tremors of nervous system    hands-essential tremor; associated with Parkinson's.   Trigger finger of right hand 11/2011   long finger   Past Surgical History:  Procedure Laterality Date   14-day Zio Patch Monitor  08/2020   (report to be scanned): Predominant SR w/ HR 61 to 142 bpm and  average 86 bpm.  Total of 59 episodes of PAT/PSVT  4 to 15 beats (not noted on diary).  Fastest was 5 beats at a rate of 193 bpm, longest was 15 beats at a rate of 106 bpm.  Rare isolated PACs (as well as couplets and triplets) with rare isolated PVCs.  No sustained arrhythmias or bradycardia to explain syncope.   ANTERIOR CERVICAL DECOMPRESSION/DISCECTOMY FUSION 4 LEVELS N/A 11/14/2013   Procedure: ANTERIOR CERVICAL DECOMPRESSION/DISCECTOMY FUSION 4 LEVELS;  Surgeon: Newman Pies, MD;  Location: [place] [facility];  Service: Neurosurgery;  Laterality: N/A;  C34 C45 C56 C67 anterior cervical fusion with interbody prosthesis plating and  bonegraft   APPENDECTOMY  age 64   BREAST LUMPECTOMY  06/16/2009   left; SLN bx.   BREAST LUMPECTOMY  07/01/2009   re-excision   BREAST SURGERY  02/22/1997   reduction   CATARACT EXTRACTION, BILATERAL     COLONOSCOPY WITH PROPOFOL N/A 12/03/2014   Procedure: COLONOSCOPY WITH PROPOFOL;  Surgeon: [name], MD;  Location: [facility];  Service: Endoscopy;  Laterality: N/A;   FOOT SURGERY  06/22/2011   left   KNEE ARTHROSCOPY  03/12/2005   right   KNEE ARTHROSCOPY  left   Lower Extremity Venous Dopplers  12/12/2020   No DVT bilaterally in the deep veins or superficial veins.  No deep or superficial venous reflux noted bilaterally with exception of Right SSV at the knee.   NASAL SINUS SURGERY     x 2   NEPHRECTOMY LIVING DONOR Left 04/22/2008   donated to spouse 2010([facility])   TRANSTHORACIC ECHOCARDIOGRAM  12/10/2020   EF 60 to 65%.  Normal LV size and function.  No heart WMA.  GR 1 DD.  Normal RV, RVP and RAP.[name]  Normal valves.   TRIGGER FINGER RELEASE  12/16/2011   Procedure: RELEASE TRIGGER FINGER/A-1 PULLEY;  Surgeon: Tennis Must, MD;  Location: [facility];  Service: Orthopedics;  Laterality: Right;  RIGHT LONG FINGER TRIGGER RELEASE & ANNULAR CYST EXCISION   TUMOR EXCISION  age 12   right arm   Patient Active Problem List    Diagnosis Date Noted   Moderate persistent asthma without complication 99991111   PSVT (paroxysmal supraventricular tachycardia) 11/26/2020   Syncope and collapse 11/24/2020   Primary osteoarthritis of right knee 05/12/2020   Primary osteoarthritis of left knee 05/12/2020   Hyperlipidemia    Bilateral calf pain 03/05/2019   Genetic testing 02/13/2019   Family history of ovarian cancer    Family history of bladder cancer    Family history of colon cancer    Family history of leukemia    Lumbar radiculopathy 12/08/2018   Myofascial pain 12/08/2018   Impaired gait and mobility 12/08/2018   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   Chronic nonallergic rhinitis 12/29/2016   Mild persistent asthma, uncomplicated [id number]   Vocal fold paralysis, 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   Gastroesophageal reflux disease 12/29/2016   Cervical spondylosis with myelopathy and radiculopathy 11/14/2013   Essential tremor 06/13/2013   Cervical spondylosis without myelopathy 06/13/2013   Breast cancer of lower-outer quadrant of left female breast ([place]) 09/10/2010    ONSET DATE: 04/13/2022  REFERRING DIAG: G20.A1 (ICD-10-CM) - Parkinson's disease without dyskinesia or fluctuating manifestations  THERAPY DIAG:  Unsteadiness on feet  Rationale for Evaluation and Treatment: Rehabilitation  SUBJECTIVE:                                                                                                                                                                                             SUBJECTIVE STATEMENT: Pt, who is well known to this clinic, ambulates into clinic w/o AD. Pt reports Dr. Leta Baptist saw her and immediately told her she needs "balance training" without talking to her. Pt denies any falls since October when she was last here. Pt does report she finds herself  grabbing for objects around her more frequently as she is fearful of falling, but she believes it is due to her confidence level and not actually her  losing her balance. Has not used the rollator since obtaining it in October.   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 here with essential tremor since 1990's. Now with intermittent resting tremor AND bradykinesia since 2016. May represent essential tremor with superimposed parkinson's disease + cervical myelopathy sequelae  PAIN:  Are you having pain? No  PRECAUTIONS: Fall  WEIGHT BEARING RESTRICTIONS: No  FALLS: Has patient fallen in last 6 months? No  LIVING ENVIRONMENT: Lives with: lives with their family and lives alone Lives in: House/apartment Stairs: Yes: Internal: full flight steps; can reach both and External: 3 steps; can reach both Has following equipment at home: Walker - 4 wheeled, bed side commode, and Grab bars  PLOF: Independent  PATIENT GOALS: "help my balance"   OBJECTIVE:   COGNITION: Overall cognitive status: Within functional limits for tasks assessed    COORDINATION: Heel to shin test: WNL bilaterally    POSTURE: rounded shoulders, forward head, increased thoracic kyphosis, and weight shift right   LOWER EXTREMITY MMT:  Tested in seated position   MMT Right Eval Left Eval  Hip flexion 4+ 5  Hip extension    Hip abduction 4+ 5  Hip adduction 4+ 5  Hip internal rotation    Hip external rotation    Knee flexion 4 5  Knee extension 4+ 5  Ankle dorsiflexion 5 5  Ankle plantarflexion    Ankle inversion    Ankle eversion     (Blank rows = not tested)  BED MOBILITY:  Independent per pt  TRANSFERS: Assistive device utilized: None  Sit to stand: Modified independence Stand to sit: Modified independence   GAIT: Gait pattern: step through pattern, decreased arm swing- Right, decreased arm swing- Left, decreased stride length, knee flexed in stance- Right, and knee flexed in stance- Left Distance walked: Various clinic distances  Assistive device utilized: None Level of assistance: Modified independence Comments: No instability noted, pt w/improved knee extension this date  FUNCTIONAL TESTS:   [facility] PT Assessment - 04/16/22 1434       Transfers   Five time sit to stand comments  20.85s without UE support      Ambulation/Gait   Gait velocity 32.8' over 11.38s = 2.88 ft/s without AD      Balance   Balance Assessed Yes      Standardized Balance Assessment   Standardized Balance Assessment Timed Up and Go Test      Timed Up and Go Test   Normal TUG (seconds) 11.28    Cognitive TUG (seconds) 22.07    TUG Comments No AD               PATIENT EDUCATION: Education details: Eval findings, plan to contact [facility] to request Dr. Carles Collet, reminder about PD screens in May  Person educated: Patient Education method: Explanation and Demonstration Education comprehension: verbalized understanding   GOALS: N/A   ASSESSMENT:  CLINICAL IMPRESSION: Patient is a 82 year old  female referred to Neuro OPPT for PD.   Pt's PMH is significant for: Acquired solitary kidney (04/2008), Asthma, Breast cancer ([place]) (05/2009), Cyst of finger (11/2011), Dental crowns present, Dermatitis, Frequency of urination, GERD (gastroesophageal reflux disease), Hemorrhoid, History of colon polyps (2009), History of shingles (1971), Hyperlipidemia, Hypertension, Liposarcoma of left shoulder ([place]) (1969), Parkinson's disease, PONV (postoperative nausea and vomiting), Seasonal allergies, Shingles of eyelid (1971), Tremors of nervous  system, and Trigger finger of right hand (11/2011). here with essential tremor since 1990's. Now with intermittent resting tremor AND bradykinesia since 2016. At this time, pt does not require skilled PT services as her overall functional mobility has not changed since October of 2023 when she was last here. Pt reports she has been performing her HEP regularly and is still involved in community activities. Pt performed 5x STS in 20.85s today (previously 19.68s) without UE support and her gait speed has maintained at 2.8 ft/s without AD. Pt has had no falls and feels as though she does not need PT at this time. Pt scheduled for PD screens in May and educated on how to obtain new PT referral if mobility needs change prior to May.    CLINICAL DECISION MAKING: Stable/uncomplicated  EVALUATION COMPLEXITY: Low  PLAN:  PT FREQUENCY: one time visit   Lavinia Mcneely E Shakiera Edelson, PT, DPT 04/16/2022, 2:58 PM

## 2022-04-21 ENCOUNTER — Telehealth: Payer: Self-pay | Admitting: Diagnostic Neuroimaging

## 2022-04-28 ENCOUNTER — Other Ambulatory Visit: Payer: Self-pay | Admitting: Family

## 2022-04-29 NOTE — Telephone Encounter (Signed)
Error

## 2022-05-12 ENCOUNTER — Ambulatory Visit: Payer: Medicare Other | Admitting: Internal Medicine

## 2022-05-12 NOTE — Telephone Encounter (Signed)
-----   STAFF Message -----  From: Vita Barley  Sent: 04/21/2022   4:03 PM EST  To: Penni Bombard, MD  Subject: pt requesting a referral                       Pt is asking for a referral to be sent to a Dr Tatt(ph#[phone]) for the continuation of her neurological care.     If pt is wanting care to be transferred to Dr Phillips Odor, please advise her to have her PCP send referral.

## 2022-05-24 DIAGNOSIS — R490 Dysphonia: Secondary | ICD-10-CM | POA: Diagnosis not present

## 2022-05-24 DIAGNOSIS — J385 Laryngeal spasm: Secondary | ICD-10-CM | POA: Diagnosis not present

## 2022-05-24 DIAGNOSIS — R498 Other voice and resonance disorders: Secondary | ICD-10-CM | POA: Diagnosis not present

## 2022-05-28 ENCOUNTER — Encounter: Payer: Self-pay | Admitting: Neurology

## 2022-06-01 ENCOUNTER — Ambulatory Visit: Payer: Medicare Other | Admitting: Internal Medicine

## 2022-06-01 DIAGNOSIS — I10 Essential (primary) hypertension: Secondary | ICD-10-CM | POA: Diagnosis not present

## 2022-06-01 DIAGNOSIS — J45909 Unspecified asthma, uncomplicated: Secondary | ICD-10-CM | POA: Diagnosis not present

## 2022-06-01 DIAGNOSIS — E785 Hyperlipidemia, unspecified: Secondary | ICD-10-CM | POA: Diagnosis not present

## 2022-06-04 DIAGNOSIS — J385 Laryngeal spasm: Secondary | ICD-10-CM | POA: Diagnosis not present

## 2022-06-14 DIAGNOSIS — R54 Age-related physical debility: Secondary | ICD-10-CM | POA: Diagnosis not present

## 2022-06-14 DIAGNOSIS — J45909 Unspecified asthma, uncomplicated: Secondary | ICD-10-CM | POA: Diagnosis not present

## 2022-06-14 DIAGNOSIS — G20A1 Parkinson's disease without dyskinesia, without mention of fluctuations: Secondary | ICD-10-CM | POA: Diagnosis not present

## 2022-06-14 DIAGNOSIS — Z0000 Encounter for general adult medical examination without abnormal findings: Secondary | ICD-10-CM | POA: Diagnosis not present

## 2022-06-14 DIAGNOSIS — I10 Essential (primary) hypertension: Secondary | ICD-10-CM | POA: Diagnosis not present

## 2022-06-14 DIAGNOSIS — R2681 Unsteadiness on feet: Secondary | ICD-10-CM | POA: Diagnosis not present

## 2022-06-14 DIAGNOSIS — E785 Hyperlipidemia, unspecified: Secondary | ICD-10-CM | POA: Diagnosis not present

## 2022-06-14 DIAGNOSIS — Z7189 Other specified counseling: Secondary | ICD-10-CM | POA: Diagnosis not present

## 2022-06-14 DIAGNOSIS — Z1331 Encounter for screening for depression: Secondary | ICD-10-CM | POA: Diagnosis not present

## 2022-06-14 DIAGNOSIS — Z86010 Personal history of colonic polyps: Secondary | ICD-10-CM | POA: Diagnosis not present

## 2022-06-14 DIAGNOSIS — M4726 Other spondylosis with radiculopathy, lumbar region: Secondary | ICD-10-CM | POA: Diagnosis not present

## 2022-06-14 DIAGNOSIS — Z853 Personal history of malignant neoplasm of breast: Secondary | ICD-10-CM | POA: Diagnosis not present

## 2022-06-14 DIAGNOSIS — C50512 Malignant neoplasm of lower-outer quadrant of left female breast: Secondary | ICD-10-CM | POA: Diagnosis not present

## 2022-06-21 ENCOUNTER — Encounter
Payer: Medicare Other | Attending: Physical Medicine and Rehabilitation | Admitting: Physical Medicine and Rehabilitation

## 2022-06-21 ENCOUNTER — Encounter: Payer: Self-pay | Admitting: Physical Medicine and Rehabilitation

## 2022-06-21 VITALS — BP 132/93 | HR 99 | Ht 60.0 in | Wt 123.8 lb

## 2022-06-21 DIAGNOSIS — M25562 Pain in left knee: Secondary | ICD-10-CM

## 2022-06-21 DIAGNOSIS — M25561 Pain in right knee: Secondary | ICD-10-CM | POA: Diagnosis not present

## 2022-06-21 DIAGNOSIS — G8929 Other chronic pain: Secondary | ICD-10-CM | POA: Insufficient documentation

## 2022-06-21 DIAGNOSIS — R2689 Other abnormalities of gait and mobility: Secondary | ICD-10-CM

## 2022-06-21 MED ORDER — LIDOCAINE HCL 1 % IJ SOLN
2.0000 mL | Freq: Once | INTRAMUSCULAR | Status: AC
  Administered 2022-06-21: 2 mL

## 2022-06-21 MED ORDER — BETAMETHASONE SOD PHOS & ACET 6 (3-3) MG/ML IJ SUSP
12.0000 mg | Freq: Once | INTRAMUSCULAR | Status: AC
Start: 2022-06-21 — End: 2022-06-21
  Administered 2022-06-21: 12 mg via INTRA_ARTICULAR

## 2022-06-21 NOTE — Patient Instructions (Signed)
Plan: steroid injection was performed at B/L knees using 1% plain Lidocaine and 1cc Celestone since Kenalog wears off so fas.t  This was well tolerated.  Cleaned with betadine x3 and allowed to dry- then alcohol then injected using 27 gauge 1.5 inch needle- no bleeding or complications.    F/U in 3 months for steroid injections of B/L knees with Celestone.   Lidocaine will kick in 15 minutes- and wear off tonight- the steroid will kick in tomorrow within 24 hours and take up to 72 hours to fully kick in.   2. Not taking Tramadol much- rare usage.  Doesn't need UDS or refill   3. F/U in 3months- Celestone injections

## 2022-06-21 NOTE — Progress Notes (Signed)
Patient is a 82 yr old female with 1 kidney (has R- donated L to husband 10 yrs ago), Parkinson's disease, HTN, hx of Breast CA 9 yrs ago- R breast- s/p lumpectomy; HLD; mild asthma here for  f/u for lumbar radiculopathy. 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 09/18/19 Back injection- R L4-5 transforminal.  epidural steroid injection.  Here for f/u for chronic pain and B/L knee injections.     2 weeks since last laryngeal injections- usually takes 3 weeks to come back- every day a little stronger.   Taking Tylenol- doesn't take Tramadol unless things really bad. At least 2-3 months ago since last took it.    Seemed to wear off faster this time, but could have been due ot hitting L knee.   knees hurting a lot- esp L knee.  Bumped it on doorframe- 3 weeks ago- been really painful- medial aspect of L knee- around Medial plateau.     Plan: steroid injection was performed at B/L knees using 1% plain Lidocaine and 1cc Celestone since Kenalog wears off so fas.t  This was well tolerated.  Cleaned with betadine x3 and allowed to dry- then alcohol then injected using 27 gauge 1.5 inch needle- no bleeding or complications.    F/U in 3 months for steroid injections of B/L knees with Celestone.   Lidocaine will kick in 15 minutes- and wear off tonight- the steroid will kick in tomorrow within 24 hours and take up to 72 hours to fully kick in.   2. Not taking Tramadol much- rare usage.  Doesn't need UDS or refill   3. F/U in 3months- Celestone injections

## 2022-06-22 ENCOUNTER — Ambulatory Visit: Payer: Medicare Other | Admitting: Neurology

## 2022-06-23 ENCOUNTER — Other Ambulatory Visit: Payer: Self-pay | Admitting: Family

## 2022-06-28 ENCOUNTER — Other Ambulatory Visit: Payer: Self-pay | Admitting: Physical Medicine and Rehabilitation

## 2022-07-08 NOTE — Progress Notes (Signed)
Assessment/Plan:   1.  Tremor  -Patient with longstanding history of essential tremor.  The question really is whether or not she has Parkinson's disease.  She did not look particularly parkinsonian today, but she did take her levodopa and we certainly could be fooled by that.  -Patient previously saw Dr. Marjory Lies and Dr. Rubin Payor  -Patient had DaTscan at [facility] in February, 2020 and it stated that radiotracer uptake was mildly decreased bilaterally, but felt to be 2 standard deviations within age-matched controls.  -Records indicate that Dr. Westley Hummer did on/off testing in February, 2020 and felt that levodopa helped tremor but did not help bradykinesia (UPDRS off score was 40 and on score was still 31).  DBS was offered and declined at that time because patient's husband was ill.  -Neurocognitive testing at [facility] February, 2020 with evidence of MCI, but concerns for developing Alzheimer's.  I did not see evidence of that today  -discussed skin biopsies for alpha synuclein, and ultimately she decided to go ahead and proceed with that.  -Patient asked whether or not she should go ahead and continue the therapy screenings that she has upcoming with neurorehab.  I told her she should absolutely do that, as essential tremor can also cause balance trouble, affecting the climbing fibers.   2.  Spasmodic dysphonia  -doing botox at [facility] and has been since 2017  3.  Hemifacial spasm, L  -Not bothersome to patient.  She has a little bit of blepharospasm as well.  Subjective:   April Church was seen today in the movement disorders clinic for neurologic consultation at the request of Lorenda Ishihara,*.  The consultation is for the evaluation of Parkinsons disease.  Patient has been followed by Dr. Marjory Lies for many years (well over 10 but she states that she saw 2 others at [facility] before him).  She was diagnosed with essential tremor early on and placed on  primidone.  She was worked all the way up to 250 mg twice per day.  In addition to hand tremor, she also was noted to have focal tremor/spasmodic dysphonia.  2016 notes indicate that patient started to develop resting tremor.  She was started on levodopa in December, 2016.  She started Botox with ENT for spasmodic dysphonia in 2017.  She was sent to Dr. Rubin Payor for consultation in January, 2020.  She was sent for DaT scan at [facility] in February, 2020 and while it stated that radiotracer uptake was mildly decreased bilaterally, it was felt to be within 2 standard deviations of age-matched controls.  It appears he did an on/off test that visit and noted that right hand tremor completely resolved and was responsive to levodopa, but bradykinesia scores did not improve.  He discussed potentially looking at left STN DBS.  She had neurocognitive testing in February, 2020 at [facility] and they were concerned about executive function, but felt it was not typical for Parkinson's and wondered about development of coexisting Alzheimer's pathology.  However, given intact functional ability, they stated that she really had MCI and thought she could be an appropriate surgical candidate.  She ultimately, however, decided not to proceed with DBS surgery.  Pt states that husband was having health issues at the time and it just wasn't the right now.    Current movement disorder medications: Carbidopa/levodopa 25/100, 2 tablets 3 times per day (she takes at 8am/noon/5pm) Entacapone, 200 mg 3 times per day (she admits that she  only takes it once daily as she worried it would affect kidney function but pharmacist told her it wouldn't - worried d/t urine color change) Primidone, 50 mg, 2 tablets 3 times per day   Specific Symptoms:  Tremor: Yes.  , bilateral UE but started in the R hand > 15 years ago.  Shakes most with use of the hands.  She is R hand dominant Family hx of similar:  No. Voice: has spasmodic  dysphonia Sleep: some trouble with sleep - lost husband in 04/2020  Vivid Dreams:  No.  Acting out dreams:  No. Postural symptoms:  Yes.   - balance is "a little off."  States that she got a walker but she doesn't use it so her PCP convinced her to get a cane and she uses it out of the home  Falls?  Yes.  , last fall over a year ago - sounds like this was a syncopal episode though and she was evaluated by cardiology and w/u neg Bradykinesia symptoms: difficulty getting out of a chair; no shuffle Loss of smell:  No. Loss of taste:  No. Urinary Incontinence:  No. But has urgency Difficulty Swallowing:  No. Handwriting, micrographia: No. - "the longer I write the larger it gets" Trouble with ADL's:  No.  Trouble buttoning clothing: No. Depression:  No. Memory changes:  No. Hallucinations:  No.  visual distortions: No. N/V:  No. Lightheaded:  No.  Syncope: No. Diplopia:  No.   PREVIOUS MEDICATIONS:  primidone, up to 250 mg twice per day; carbidopa/levodopa ; entacapone  ALLERGIES:   Allergies  Allergen Reactions   Shellfish Allergy Shortness Of Breath   Duloxetine Itching and Rash   Sulfa Drugs Cross Reactors Anxiety and Rash   Lisinopril     Other reaction(s): cough Other reaction(s): cough Other reaction(s): cough   Lyrica [Pregabalin] Other (See Comments)    "made me drunk, couldn't function"   Other     Other reaction(s): hyper   Sulfa Antibiotics Other (See Comments)    Other reaction(s): hyper, break out in a rash Other reaction(s): hyper, break out in a rash   Codeine Anxiety    Other reaction(s): hyper Other reaction(s): hyper    CURRENT MEDICATIONS:  Current Meds  Medication Sig   albuterol (VENTOLIN HFA) 108 (90 Base) MCG/ACT inhaler Inhale 1-2 puffs into the lungs every 6 (six) hours as needed for wheezing or shortness of breath.   amLODipine (NORVASC) 2.5 MG tablet Take one tablet each day.   atorvastatin (LIPITOR) 20 MG tablet Take 20 mg by mouth daily.    budesonide-formoterol (SYMBICORT) 160-4.5 MCG/ACT inhaler Inhale 2 puffs into the lungs as needed.   CALCIUM CARBONATE PO Take 1 tablet by mouth 2 (two) times daily.   carbidopa-levodopa (SINEMET IR) 25-100 MG tablet Take 2 tablets by mouth 3 (three) times daily.   Cholecalciferol (VITAMIN D) 1000 UNITS capsule Take 1,000 Units by mouth 2 (two) times daily.   cyclobenzaprine (FLEXERIL) 5 MG tablet TAKE 1 TO UP TO 2 TABLETS BY MOUTH ONCE DAILY AT BEDTIME AS NEEDED FOR MUSCLE SPASM   entacapone (COMTAN) 200 MG tablet Take 1 tablet (200 mg total) by mouth 3 (three) times daily.   EPINEPHrine 0.3 mg/0.3 mL IJ SOAJ injection Inject 0.3 mg into the muscle as needed.   famotidine (PEPCID) 20 MG tablet Take 1 tablet by mouth once daily   fluticasone (FLOVENT HFA) 110 MCG/ACT inhaler 1 puff   hydrocortisone (ANUSOL-HC) 2.5 % rectal cream Procto-Med HC  2.5 % topical cream perineal applicator  APPLY CREAM RECTALLY TO AFFECTED AREA TWICE DAILY FOR 7 DAYS   ipratropium (ATROVENT) 0.03 % nasal spray USE 2 SPRAY(S) IN EACH NOSTRIL TWICE DAILY   ketotifen (ZADITOR) 0.025 % ophthalmic solution Apply 1 drop to eye daily.   levocetirizine (XYZAL) 5 MG tablet TAKE 1 TABLET BY MOUTH ONCE DAILY IN THE EVENING . APPOINTMENT REQUIRED FOR FUTURE REFILLS   metoprolol succinate (TOPROL-XL) 25 MG 24 hr tablet Take 1 tablet (25 mg total) by mouth at bedtime.   nystatin ointment (MYCOSTATIN) Apply 1 Application topically 2 (two) times daily.   Polyethyl Glycol-Propyl Glycol (SYSTANE OP) Place 1 drop into both eyes 2 (two) times daily.   primidone (MYSOLINE) 50 MG tablet Take 2 tablets (100 mg total) by mouth 3 (three) times daily.   traMADol (ULTRAM) 50 MG tablet TAKE 1 TABLET BY MOUTH EVERY 12 HOURS AS NEEDED   triamcinolone ointment (KENALOG) 0.1 % Apply 1 Application topically as needed.   venlafaxine (EFFEXOR) 75 MG tablet Take 1 tablet (75 mg total) by mouth daily.     Objective:   VITALS:   Vitals:   07/09/22  0932  BP: 138/80  Pulse: 71  SpO2: 96%  Weight: 126 lb 12.8 oz (57.5 kg)  Height: 5' (1.524 m)    GEN:  The patient appears stated age and is in NAD. HEENT:  Normocephalic, atraumatic.  The mucous membranes are moist. The superficial temporal arteries are without ropiness or tenderness.  There is L facial spasm and mild blepharospasm. CV:  RRR Lungs:  CTAB Neck/HEME:  There are no carotid bruits bilaterally.  Neurological examination:  Orientation: The patient is alert and oriented x3.  Cranial nerves: There is good facial symmetry. Extraocular muscles are intact. The visual fields are full to confrontational testing. The speech is fluent with mild spasmodic dysphonia and voice does go in/out as just had botox recently. Soft palate rises symmetrically and there is no tongue deviation. Hearing is intact to conversational tone. Sensation: Sensation is intact to light and pinprick throughout (facial, trunk, extremities). Vibration is intact at the bilateral big toe. There is no extinction with double simultaneous stimulation. There is no sensory dermatomal level identified. Motor: Strength is 5/5 in the bilateral upper and lower extremities.   Shoulder shrug is equal and symmetric.  There is no pronator drift. Deep tendon reflexes: Deep tendon reflexes are 2-/4 at the bilateral biceps, triceps, brachioradialis, patella and achilles. Plantar responses are downgoing bilaterally.  Movement examination: Tone: There is nl tone in the bilateral upper extremities.  The tone in the lower extremities is nl.  Abnormal movements: there is b/l UE rest tremor (doesn't increase with distraction), R>L.  There is mild postural and at least mod intention tremor.  She has tremor with archimedes spiral b/l but drawn better than expected.  she has difficulty when asked to pour a full glass of water from one glass to another.  Coordination:  There is difficulty only with finger taps and that was not decremation but  she had trouble doing it b/c of tremor.  All other RAMs are nl with any form of RAMS, including alternating supination and pronation of the forearm, hand opening and closing, heel taps and toe taps bilaterally  Gait and Station: The patient has no difficulty arising out of a deep-seated chair without the use of the hands. The patient's stride length is decreased but no shuffling. She is not particularly stable I have reviewed and interpreted  the following labs independently   Chemistry      Component Value Date/Time   NA 142 07/30/2020 1141   NA 136 01/20/2015 1326   K 4.5 07/30/2020 1141   K 4.1 01/20/2015 1326   CL 100 07/30/2020 1141   CL 102 06/30/2012 1231   CO2 25 07/30/2020 1141   CO2 27 01/20/2015 1326   BUN 11 07/30/2020 1141   BUN 13.9 01/20/2015 1326   CREATININE 0.65 07/30/2020 1141   CREATININE 0.9 01/20/2015 1326      Component Value Date/Time   CALCIUM 9.1 07/30/2020 1141   CALCIUM 8.8 01/20/2015 1326   ALKPHOS 86 01/20/2015 1326   AST 17 01/20/2015 1326   ALT 13 01/20/2015 1326   BILITOT <0.30 01/20/2015 1326      No results found for: "TSH" Lab Results  Component Value Date   WBC 5.2 01/20/2015   HGB 12.0 01/20/2015   HCT 36.1 01/20/2015   MCV 88.4 01/20/2015   PLT 293 01/20/2015     Total time spent on today's visit was 60 minutes, including both face-to-face time and nonface-to-face time.  Time included that spent on review of records (prior notes available to me/labs/imaging if pertinent), discussing treatment and goals, answering patient's questions and coordinating care.  Cc:  Lorenda Ishihara, MD

## 2022-07-09 ENCOUNTER — Ambulatory Visit (INDEPENDENT_AMBULATORY_CARE_PROVIDER_SITE_OTHER): Payer: Medicare Other | Admitting: Neurology

## 2022-07-09 ENCOUNTER — Encounter: Payer: Self-pay | Admitting: Neurology

## 2022-07-09 VITALS — BP 138/80 | HR 71 | Ht 60.0 in | Wt 126.8 lb

## 2022-07-09 DIAGNOSIS — G250 Essential tremor: Secondary | ICD-10-CM

## 2022-07-14 ENCOUNTER — Ambulatory Visit: Payer: Medicare Other | Admitting: Neurology

## 2022-07-21 ENCOUNTER — Telehealth: Payer: Self-pay | Admitting: Diagnostic Neuroimaging

## 2022-07-21 NOTE — Telephone Encounter (Signed)
Sent mychart msg informing pt of appt change due to provider schedule change 

## 2022-07-22 ENCOUNTER — Ambulatory Visit: Payer: Medicare Other | Attending: Diagnostic Neuroimaging | Admitting: Speech Pathology

## 2022-07-22 ENCOUNTER — Ambulatory Visit: Payer: Medicare Other | Admitting: Occupational Therapy

## 2022-07-22 ENCOUNTER — Ambulatory Visit: Payer: Medicare Other | Admitting: Physical Therapy

## 2022-07-22 ENCOUNTER — Encounter: Payer: Self-pay | Admitting: Physical Therapy

## 2022-07-22 ENCOUNTER — Telehealth: Payer: Self-pay | Admitting: Occupational Therapy

## 2022-07-22 DIAGNOSIS — R2681 Unsteadiness on feet: Secondary | ICD-10-CM | POA: Insufficient documentation

## 2022-07-22 DIAGNOSIS — R279 Unspecified lack of coordination: Secondary | ICD-10-CM

## 2022-07-22 DIAGNOSIS — R490 Dysphonia: Secondary | ICD-10-CM

## 2022-07-22 DIAGNOSIS — R251 Tremor, unspecified: Secondary | ICD-10-CM

## 2022-07-22 DIAGNOSIS — R278 Other lack of coordination: Secondary | ICD-10-CM

## 2022-07-22 NOTE — Therapy (Signed)
[facility] [address] [place], [place], [address] Phone: [phone]   Fax:  [phone]  Patient Details  Name: April Church MRN: [id number] Date of Birth: Jun 27, 1940 Referring Provider:  Lorenda Ishihara,*  Encounter Date: 07/22/2022  Physical Therapy Parkinson's Disease Screen   Timed Up and Go test:10.4 seconds with no AD (previously 11.28 seconds)  10 meter walk test:13.65 seconds = 2.4 ft/sec (previously 2.88 ft/sec)  5 time sit to stand test: 19.7 seconds (previously 20.8 seconds)  Has been walking more with the cane. Has been doing walking for exercise. Also has a rollator, but it is hard to fold it up and bring it in. No falls, but finds herself grabbing a piece of furniture.   Patient would benefit from Physical Therapy evaluation due to worsening balance and feeling more unsteady.   Drake Leach, PT, DPT  07/22/2022, 10:29 AM  [facility] [address] [place], [place], [address] Phone: [phone]   Fax:  [phone]

## 2022-07-22 NOTE — Telephone Encounter (Signed)
Hello Dr. Arbutus Leas,   April Church was seen for PD screens today.  The patient would benefit from OT and PT evaluations for decline in coordination, ADLS, worsening balance.   If you agree, please place an order in [facility] workque in [facility] or fax the order to [phone]. Thank you, Jene Every, OTR/L  [facility] [address] [place], [place]  [address] Phone:  [phone] Fax:  [phone]

## 2022-07-22 NOTE — Therapy (Signed)
[facility] [address] [place], [place], [address] Phone: [phone]   Fax:  [phone]  Patient Details  Name: April Church MRN: [id number] Date of Birth: [birth year] Referring Provider:  Lorenda Ishihara,*  Encounter Date: 07/22/2022 Speech Therapy Parkinson's Disease Screen   Pt does not not report difficulty with swallowing, which does not warrant further evaluation. She is going to have a scan test in June. She mentions that she has had a Botox injection in her voice box to help, but that it makes her lose her voice for three weeks. Scans show that there are no growths on the vocal folds and only a tremor is noted. No notes no concerns in cognition, and has a calendar system in place at home to keep track of appointments. Patient notes issues with breath support during longs conversational phrases. No referral at this time, but patient is recommended to follow up if no progress is seen after Botox injections have settled.  Pt does does not require speech therapy services at this time. Recommend ST screen in another six months   [facility], Student-SLP 07/22/2022, 9:38 AM  [facility] [address] [place], [place], [address] Phone: [phone]   Fax:  [phone]

## 2022-07-22 NOTE — Therapy (Signed)
[facility] [address] [place], [place], [address] Phone: [phone]   Fax:  [phone]  Patient Details  Name: April Church MRN: [id number] Date of Birth: Jan 24, 1941 Referring Provider:  Kerin Salen  Encounter Date: 07/22/2022  Occupational Therapy Parkinson's Disease Screen  Hand dominance:  Rt handed   Physical Performance Test item #2 (simulated eating):  20 sec  Fastening/unfastening 3 buttons in:  44.89 sec  9-hole peg test:    RUE  37.38 sec        LUE  33.66 sec  Standing Functional Reach Test:   RUE  10 inches        LUE  11 inches  Change in ability to perform ADLs/IADLs:  pt reports no changes in ADLS but handwriting is worse at end of the day during "off" times. Legible and maintains size  Other Comments:  BUE AROM WFL's  Pt would benefit from occupational therapy evaluation due to slight decline in simulated eating test, decline with buttons, and functional balance.     Sheran Lawless, OT 07/22/2022, 9:26 AM  [facility] [address] [place], [place], [address] Phone: [phone]   Fax:  [phone]

## 2022-07-27 ENCOUNTER — Encounter: Payer: Self-pay | Admitting: Gastroenterology

## 2022-07-27 ENCOUNTER — Ambulatory Visit: Payer: Medicare Other | Admitting: Internal Medicine

## 2022-07-27 ENCOUNTER — Ambulatory Visit (INDEPENDENT_AMBULATORY_CARE_PROVIDER_SITE_OTHER): Payer: Medicare Other | Admitting: Gastroenterology

## 2022-07-27 VITALS — BP 120/80 | HR 80 | Ht 60.0 in | Wt 125.2 lb

## 2022-07-27 DIAGNOSIS — K625 Hemorrhage of anus and rectum: Secondary | ICD-10-CM | POA: Diagnosis not present

## 2022-07-27 DIAGNOSIS — K649 Unspecified hemorrhoids: Secondary | ICD-10-CM | POA: Diagnosis not present

## 2022-07-27 NOTE — Progress Notes (Signed)
I agree with the assessment and plan as outlined by Ms. Zehr. 

## 2022-07-27 NOTE — Progress Notes (Signed)
07/27/2022 April Church [id number] 06-Oct-1940   HISTORY OF PRESENT ILLNESS:  This is an 82 year old female who is a patient of Dr. Derek Mound.  She is here today for follow-up of rectal bleeding from an internal hemorrhoid.  This was seen on examine by Dr. Leonides Schanz in February and found to have an internal hemorrhoid that was denuded and thought to be the source of bleeding.  Was prescribed hydrocortisone.  She tells me she has been using that regularly since that visit, but still has intermittent bleeding, generally on the paper, but sometimes more.  She had 2 other appointments with Dr. Leonides Schanz for follow that she did not come to.   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 2011- no further tx. now- Dr. Donnie Coffin , Dr. Dayton Scrape   Cyst of finger 11/2011   annular cyst right long finger   Dental crowns present    Dermatitis    Frequency of urination    GERD (gastroesophageal reflux disease)    Hemorrhoid    History of colon polyps 2009   Also noted 2012 and 2017 by colonoscopy.   History of shingles 1971   Had right ischial recurrence in the March 2019   Hyperlipidemia    On atorvastatin   Hypertension    under control, has been on med. x 2 yrs.   Liposarcoma of left shoulder (HCC) 1969   Parkinson's disease    With mild tremor (neurologist Dr. Marjory Lies), neurosurgeon Dr. Lovell Sheehan   PONV (postoperative nausea and vomiting)    Seasonal allergies    Shingles of eyelid 1971   Right eyelid; recurrent -> last episode 05/11/2017   Tremors of nervous system    hands-essential tremor; associated with Parkinson's.   Trigger finger of right hand 11/2011   long finger   Past Surgical History:  Procedure Laterality Date   14-day Zio Patch Monitor  08/2020   (report to be scanned): Predominant SR w/ HR  61 to 142 bpm and average 86 bpm.  Total of 59 episodes of PAT/PSVT  4 to 15 beats (not noted on diary).  Fastest was 5 beats at a rate of 193 bpm, longest was 15 beats at a rate of 106 bpm.  Rare isolated PACs (as well as couplets and triplets) with rare isolated PVCs.  No sustained arrhythmias or bradycardia to explain syncope.   ANTERIOR CERVICAL DECOMPRESSION/DISCECTOMY FUSION 4 LEVELS N/A 11/14/2013   Procedure: ANTERIOR CERVICAL DECOMPRESSION/DISCECTOMY FUSION 4 LEVELS;  Surgeon: Tressie Stalker, MD;  Location: [facility];  Service: Neurosurgery;  Laterality: N/A;  C34 C45 C56 C67 anterior cervical fusion with interbody prosthesis plating and  bonegraft   APPENDECTOMY  age 40   BREAST LUMPECTOMY  06/16/2009   left; SLN bx.   BREAST LUMPECTOMY  07/01/2009   re-excision   BREAST SURGERY  02/22/1997   reduction   CATARACT EXTRACTION, BILATERAL     COLONOSCOPY WITH PROPOFOL N/A 12/03/2014   Procedure: COLONOSCOPY WITH PROPOFOL;  Surgeon: Charolett Bumpers, MD;  Location: [facility];  Service: Endoscopy;  Laterality: N/A;   FOOT SURGERY  06/22/2011   left   KNEE ARTHROSCOPY  03/12/2005   right   KNEE ARTHROSCOPY     left   Lower Extremity Venous Dopplers  12/12/2020  No DVT bilaterally in the deep veins or superficial veins.  No deep or superficial venous reflux noted bilaterally with exception of Right SSV at the knee.   NASAL SINUS SURGERY     x 2   NEPHRECTOMY LIVING DONOR Left 04/22/2008   donated to spouse 2010([facility])   TRANSTHORACIC ECHOCARDIOGRAM  12/10/2020   EF 60 to 65%.  Normal LV size and function.  No heart WMA.  GR 1 DD.  Normal RV, RVP and RAP.[name]  Normal valves.   TRIGGER FINGER RELEASE  12/16/2011   Procedure: RELEASE TRIGGER FINGER/A-1 PULLEY;  Surgeon: Tami Ribas, MD;  Location: [facility];  Service: Orthopedics;  Laterality: Right;  RIGHT LONG FINGER TRIGGER RELEASE & ANNULAR CYST EXCISION   TUMOR EXCISION  age 24   right arm    reports that  she has never smoked. She has never used smokeless tobacco. She reports that she does not drink alcohol and does not use drugs. family history includes Bladder Cancer (age of onset: 69) in her brother; Colon cancer in her niece; Diabetes in her brother; Heart disease in her brother, paternal grandfather, and paternal grandmother; Kidney disease in her mother; Leukemia (age of onset: 38) in an other family member; Ovarian cancer (age of onset: 83) in her sister; Stroke in her father and sister. Allergies  Allergen Reactions   Shellfish Allergy Shortness Of Breath   Duloxetine Itching and Rash   Sulfa Drugs Cross Reactors Anxiety and Rash   Lisinopril     Other reaction(s): cough Other reaction(s): cough Other reaction(s): cough   Lyrica [Pregabalin] Other (See Comments)    "made me drunk, couldn't function"   Other     Other reaction(s): hyper   Sulfa Antibiotics Other (See Comments)    Other reaction(s): hyper, break out in a rash Other reaction(s): hyper, break out in a rash   Codeine Anxiety    Other reaction(s): hyper Other reaction(s): hyper      Outpatient Encounter Medications as of 07/27/2022  Medication Sig   albuterol (VENTOLIN HFA) 108 (90 Base) MCG/ACT inhaler Inhale 1-2 puffs into the lungs every 6 (six) hours as needed for wheezing or shortness of breath.   amLODipine (NORVASC) 2.5 MG tablet Take one tablet each day.   atorvastatin (LIPITOR) 20 MG tablet Take 20 mg by mouth daily.   budesonide-formoterol (SYMBICORT) 160-4.5 MCG/ACT inhaler Inhale 2 puffs into the lungs as needed.   CALCIUM CARBONATE PO Take 1 tablet by mouth 2 (two) times daily.   carbidopa-levodopa (SINEMET IR) 25-100 MG tablet Take 2 tablets by mouth 3 (three) times daily.   Cholecalciferol (VITAMIN D) 1000 UNITS capsule Take 1,000 Units by mouth 2 (two) times daily.   cyclobenzaprine (FLEXERIL) 5 MG tablet TAKE 1 TO UP TO 2 TABLETS BY MOUTH ONCE DAILY AT BEDTIME AS NEEDED FOR MUSCLE SPASM   entacapone  (COMTAN) 200 MG tablet Take 1 tablet (200 mg total) by mouth 3 (three) times daily.   famotidine (PEPCID) 20 MG tablet Take 1 tablet by mouth once daily   fluticasone (FLOVENT HFA) 110 MCG/ACT inhaler 1 puff   hydrocortisone (ANUSOL-HC) 2.5 % rectal cream Procto-Med HC 2.5 % topical cream perineal applicator  APPLY CREAM RECTALLY TO AFFECTED AREA TWICE DAILY FOR 7 DAYS   ipratropium (ATROVENT) 0.03 % nasal spray USE 2 SPRAY(S) IN EACH NOSTRIL TWICE DAILY   ketotifen (ZADITOR) 0.025 % ophthalmic solution Apply 1 drop to eye daily.   levocetirizine (XYZAL) 5 MG tablet TAKE  1 TABLET BY MOUTH ONCE DAILY IN THE EVENING . APPOINTMENT REQUIRED FOR FUTURE REFILLS   metoprolol succinate (TOPROL-XL) 25 MG 24 hr tablet Take 1 tablet (25 mg total) by mouth at bedtime.   nystatin ointment (MYCOSTATIN) Apply 1 Application topically 2 (two) times daily.   Polyethyl Glycol-Propyl Glycol (SYSTANE OP) Place 1 drop into both eyes 2 (two) times daily.   primidone (MYSOLINE) 50 MG tablet Take 2 tablets (100 mg total) by mouth 3 (three) times daily.   traMADol (ULTRAM) 50 MG tablet TAKE 1 TABLET BY MOUTH EVERY 12 HOURS AS NEEDED   triamcinolone ointment (KENALOG) 0.1 % Apply 1 Application topically as needed.   venlafaxine (EFFEXOR) 75 MG tablet Take 1 tablet (75 mg total) by mouth daily.   EPINEPHrine 0.3 mg/0.3 mL IJ SOAJ injection Inject 0.3 mg into the muscle as needed. (Patient not taking: Reported on 07/27/2022)   No facility-administered encounter medications on file as of 07/27/2022.    REVIEW OF SYSTEMS  : All other systems reviewed and negative except where noted in the History of Present Illness.   PHYSICAL EXAM: BP 120/80 (BP Location: Left Arm, Patient Position: Sitting, Cuff Size: Normal)   Pulse 80   Ht 5' (1.524 m)   Wt 125 lb 4 oz (56.8 kg)   BMI 24.46 kg/m  General: Well developed white female in no acute distress Head: Normocephalic and atraumatic Eyes:  Sclerae anicteric, conjunctiva  pink. Ears: Normal auditory acuity Musculoskeletal: Symmetrical with no gross deformities  Skin: No lesions on visible extremities Extremities: No edema  Neurological: Alert oriented x 4, grossly non-focal Psychological:  Alert and cooperative. Normal mood and affect  ASSESSMENT AND PLAN: *Rectal bleeding:  Due to an internal hemorrhoid that was seen on exam by Dr. Leonides Schanz at her last visit earlier this year.  Has been using hydrocortisone cream since that time and still having bleeding.  We discussed hemorrhoid banding.  She would like to just leave it alone for now.  She was given literature on the banding procedure and will call back to schedule with Dr. Leonides Schanz if she decides to proceed.  Patient prefers conservative measures for now due to age, which I agree with.  CC:  Lorenda Ishihara

## 2022-07-27 NOTE — Patient Instructions (Signed)
Continue Hydrocortisone cream.   Call back to schedule appointment with Dr. Leonides Schanz if you want to proceed with banding.   _______________________________________________________  If your blood pressure at your visit was 140/90 or greater, please contact your primary care physician to follow up on this.  _______________________________________________________  If you are age 83 or older, your body mass index should be between 23-30. Your Body mass index is 24.46 kg/m. If this is out of the aforementioned range listed, please consider follow up with your Primary Care Provider.  If you are age 74 or younger, your body mass index should be between 19-25. Your Body mass index is 24.46 kg/m. If this is out of the aformentioned range listed, please consider follow up with your Primary Care Provider.   ________________________________________________________  The [facility] providers would like to encourage you to use [facility] to communicate with providers for non-urgent requests or questions.  Due to long hold times on the telephone, sending your provider a message by [facility] may be a faster and more efficient way to get a response.  Please allow 48 business hours for a response.  Please remember that this is for non-urgent requests.  _______________________________________________________

## 2022-07-28 NOTE — Therapy (Unsigned)
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 2011- no further tx. now- Dr. Donnie Coffin , Dr. Dayton Scrape   Cyst of finger 11/2011   annular cyst right long finger   Dental crowns present    Dermatitis    Frequency of urination    GERD (gastroesophageal reflux disease)    Hemorrhoid    History of colon polyps 2009   Also noted 2012 and 2017 by colonoscopy.   History of shingles 1971   Had right ischial recurrence in the March 2019   Hyperlipidemia    On atorvastatin   Hypertension    under control, has been on med. x 2 yrs.   Liposarcoma of left shoulder (HCC) 1969   Parkinson's disease    With mild tremor (neurologist Dr. Marjory Lies), neurosurgeon Dr. Lovell Sheehan   PONV (postoperative nausea and vomiting)    Seasonal allergies    Shingles of eyelid 1971   Right eyelid; recurrent -> last episode 05/11/2017   Tremors of nervous system    hands-essential tremor; associated with Parkinson's.   Trigger finger of right hand 11/2011   long finger   Past Surgical History:  Procedure Laterality Date   14-day Zio Patch Monitor  08/2020   (report to be scanned): Predominant SR w/ HR 61 to 142 bpm and average 86 bpm.  Total of 59 episodes of PAT/PSVT  4 to 15 beats (not noted on diary).  Fastest was 5 beats at a rate of 193 bpm, longest was 15 beats at a rate of 106 bpm.  Rare isolated PACs (as well as couplets and triplets) with rare isolated PVCs.  No sustained arrhythmias or bradycardia to explain syncope.   ANTERIOR CERVICAL DECOMPRESSION/DISCECTOMY FUSION 4 LEVELS N/A 11/14/2013   Procedure: ANTERIOR  CERVICAL DECOMPRESSION/DISCECTOMY FUSION 4 LEVELS;  Surgeon: Tressie Stalker, MD;  Location: [facility];  Service: Neurosurgery;  Laterality: N/A;  C34 C45 C56 C67 anterior cervical fusion with interbody prosthesis plating and  bonegraft   APPENDECTOMY  age 2   BREAST LUMPECTOMY  06/16/2009   left; SLN bx.   BREAST LUMPECTOMY  07/01/2009   re-excision   BREAST SURGERY  02/22/1997   reduction   CATARACT EXTRACTION, BILATERAL     COLONOSCOPY WITH PROPOFOL N/A 12/03/2014   Procedure: COLONOSCOPY WITH PROPOFOL;  Surgeon: Charolett Bumpers, MD;  Location: [facility];  Service: Endoscopy;  Laterality: N/A;   FOOT SURGERY  06/22/2011   left   KNEE ARTHROSCOPY  03/12/2005   right   KNEE ARTHROSCOPY     left   Lower Extremity Venous Dopplers  12/12/2020   No DVT bilaterally in the deep veins or superficial veins.  No deep or superficial venous reflux noted bilaterally with exception of Right SSV at the knee.   NASAL SINUS SURGERY     x 2   NEPHRECTOMY LIVING DONOR Left 04/22/2008   donated to spouse 2010([facility])   TRANSTHORACIC ECHOCARDIOGRAM  12/10/2020   EF 60 to 65%.  Normal LV size and function.  No heart WMA.  GR 1 DD.  Normal RV, RVP and RAP.[name]  Normal valves.   TRIGGER  FINGER RELEASE  12/16/2011   Procedure: RELEASE TRIGGER FINGER/A-1 PULLEY;  Surgeon: Tami Ribas, MD;  Location: [facility];  Service: Orthopedics;  Laterality: Right;  RIGHT LONG FINGER TRIGGER RELEASE & ANNULAR CYST EXCISION   TUMOR EXCISION  age 68   right arm   Patient Active Problem List   Diagnosis Date Noted   Rectal bleeding 07/27/2022   Hemorrhoids 07/27/2022   Chronic pain of both knees 06/21/2022   Moderate persistent asthma without complication 03/27/2021   PSVT (paroxysmal supraventricular tachycardia) 11/26/2020   Syncope and collapse 11/24/2020   Primary osteoarthritis of right knee 05/12/2020   Primary osteoarthritis of left knee 05/12/2020   Hyperlipidemia    Bilateral calf  pain 03/05/2019   Genetic testing 02/13/2019   Family history of ovarian cancer    Family history of bladder cancer    Family history of colon cancer    Family history of leukemia    Lumbar radiculopathy 12/08/2018   Myofascial pain 12/08/2018   Impaired gait and mobility 12/08/2018   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   Chronic nonallergic rhinitis 12/29/2016   Mild persistent asthma, uncomplicated 12/29/2016   Vocal fold paralysis, 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  Rationale for Evaluation and Treatment: {HABREHAB:27488}  SUBJECTIVE:   SUBJECTIVE STATEMENT: *** Pt accompanied by: {accompnied:27141}  PERTINENT HISTORY: ***  PRECAUTIONS: {Therapy precautions:24002}  WEIGHT BEARING RESTRICTIONS: {Yes ***/No:24003}  PAIN:  Are you having pain? {OPRCPAIN:27236}  FALLS: Has patient fallen in last 6 months? {fallsyesno:27318}  LIVING ENVIRONMENT: Lives with: {OPRC lives with:25569::"lives with their family"} Lives in: {Lives in:25570} Stairs: {opstairs:27293} Has following equipment at home: {Assistive devices:23999}  PLOF: {PLOF:24004}  PATIENT GOALS: ***  OBJECTIVE:   HAND DOMINANCE: {MISC; OT HAND DOMINANCE:6805352453}  ADLs: Overall ADLs: *** Transfers/ambulation related to ADLs: Eating: *** Grooming: *** UB Dressing: *** LB Dressing: *** Toileting: *** Bathing: *** Tub Shower transfers: *** Equipment: {equipment:25573}  IADLs: Shopping: *** Light housekeeping: *** Meal Prep: *** Community mobility: *** Medication management: *** Financial management: *** Handwriting: {OTWRITTENEXPRESSION:25361}  MOBILITY  STATUS: {OTMOBILITY:25360}  POSTURE COMMENTS:  {posture:25561}  ACTIVITY TOLERANCE: Activity tolerance: ***  FUNCTIONAL OUTCOME MEASURES: {PDoutcomemeasures:27287}  COORDINATION: {otcoordination:27237}  UE ROM:  {PDMEASUREMENT:27288}  UE MMT:   {PDMEASUREMENT:27288}  SENSATION: {sensation:27233}  MUSCLE TONE: {UETONE:25567}  COGNITION: Overall cognitive status: {cognition:24006}  OBSERVATIONS: {PDobservations:27291}   TODAY'S TREATMENT:                                                                                                                              DATE: ***    PATIENT EDUCATION: Education details: *** Person educated: {Person educated:25204} Education method: {Education Method:25205}  Education comprehension: {Education Comprehension:25206}  HOME EXERCISE PROGRAM: ***  GOALS: Goals reviewed with patient? {yes/no:20286}  SHORT TERM GOALS: Target date: ***  *** Baseline: Goal status: {GOALSTATUS:25110}  2.  *** Baseline:  Goal status: {GOALSTATUS:25110}  3.  *** Baseline:  Goal status: {GOALSTATUS:25110}  4.  *** Baseline:  Goal status: {GOALSTATUS:25110}  5.  *** Baseline:  Goal status: {GOALSTATUS:25110}  6.  *** Baseline:  Goal status: {GOALSTATUS:25110}  LONG TERM GOALS: Target date: ***  *** Baseline:  Goal status: {GOALSTATUS:25110}  2.  *** Baseline:  Goal status: {GOALSTATUS:25110}  3.  *** Baseline:  Goal status: {GOALSTATUS:25110}  4.  *** Baseline:  Goal status: {GOALSTATUS:25110}  5.  *** Baseline:  Goal status: {GOALSTATUS:25110}  6.  *** Baseline:  Goal status: {GOALSTATUS:25110} ASSESSMENT:  CLINICAL IMPRESSION: Patient is a *** y.o. *** who was seen today for occupational therapy evaluation for ***.   PERFORMANCE DEFICITS: in functional skills including {OT physical skills:25468}, cognitive skills including {OT cognitive skills:25469}, and psychosocial skills including {OT psychosocial  skills:25470}.   IMPAIRMENTS: are limiting patient from {OT performance deficits:25471}.   COMORBIDITIES:  {Comorbidities:25485} that affects occupational performance. Patient will benefit from skilled OT to address above impairments and improve overall function.  MODIFICATION OR ASSISTANCE TO COMPLETE EVALUATION: {OT modification:25474}  OT OCCUPATIONAL PROFILE AND HISTORY: {OT PROFILE AND HISTORY:25484}  CLINICAL DECISION MAKING: {OT CDM:25475}  REHAB POTENTIAL: {rehabpotential:25112}  EVALUATION COMPLEXITY: {Evaluation complexity:25115}    PLAN:  OT FREQUENCY: {rehab frequency:25116}  OT DURATION: {rehab duration:25117}  PLANNED INTERVENTIONS: {OT Interventions:25467}  RECOMMENDED OTHER SERVICES: ***  CONSULTED AND AGREED WITH PLAN OF CARE: {WJX:91478}  PLAN FOR NEXT SESSION: Delana Meyer, OT 07/28/2022, 5:33 PM

## 2022-07-29 ENCOUNTER — Ambulatory Visit: Payer: Medicare Other | Admitting: Occupational Therapy

## 2022-07-29 ENCOUNTER — Ambulatory Visit: Payer: Medicare Other | Attending: Diagnostic Neuroimaging | Admitting: Physical Therapy

## 2022-07-29 ENCOUNTER — Encounter: Payer: Self-pay | Admitting: Occupational Therapy

## 2022-07-29 DIAGNOSIS — R2689 Other abnormalities of gait and mobility: Secondary | ICD-10-CM | POA: Diagnosis not present

## 2022-07-29 DIAGNOSIS — R251 Tremor, unspecified: Secondary | ICD-10-CM | POA: Diagnosis not present

## 2022-07-29 DIAGNOSIS — R2681 Unsteadiness on feet: Secondary | ICD-10-CM | POA: Insufficient documentation

## 2022-07-29 DIAGNOSIS — R279 Unspecified lack of coordination: Secondary | ICD-10-CM | POA: Diagnosis not present

## 2022-07-29 DIAGNOSIS — R293 Abnormal posture: Secondary | ICD-10-CM | POA: Insufficient documentation

## 2022-07-29 DIAGNOSIS — R278 Other lack of coordination: Secondary | ICD-10-CM | POA: Insufficient documentation

## 2022-07-29 NOTE — Therapy (Signed)
OUTPATIENT PHYSICAL THERAPY NEURO EVALUATION   Patient Name: April Church MRN: [id number] DOB:[birth year], 82 y.o., female Today's Date: 07/29/2022   PCP: Lorenda Ishihara, MD REFERRING PROVIDER: Vladimir Faster, DO  END OF SESSION:  PT End of Session - 07/29/22 1538     Visit Number 1    Number of Visits 13   Plus eval   Date for PT Re-Evaluation 09/09/22    Authorization Type Medicare A & B    PT Start Time 1533    PT Stop Time 1616    PT Time Calculation (min) 43 min    Activity Tolerance Patient tolerated treatment well    Behavior During Therapy WFL for tasks assessed/performed             Past Medical History:  Diagnosis Date   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 2011- no further tx. now- Dr. Donnie Coffin , Dr. Dayton Scrape   Cyst of finger 11/2011   annular cyst right long finger   Dental crowns present    Dermatitis    Frequency of urination    GERD (gastroesophageal reflux disease)    Hemorrhoid    History of colon polyps 2009   Also noted 2012 and 2017 by colonoscopy.   History of shingles 1971   Had right ischial recurrence in the March 2019   Hyperlipidemia    On atorvastatin   Hypertension    under control, has been on med. x 2 yrs.   Liposarcoma of left shoulder (HCC) 1969   Parkinson's disease    With mild tremor (neurologist Dr. Marjory Lies), neurosurgeon Dr. Lovell Sheehan   PONV (postoperative nausea and vomiting)    Seasonal allergies    Shingles of eyelid 1971   Right eyelid; recurrent -> last episode 05/11/2017   Tremors of nervous system    hands-essential tremor; associated with Parkinson's.   Trigger finger of right hand 11/2011   long finger   Past Surgical History:  Procedure Laterality Date   14-day Zio Patch Monitor  08/2020   (report to be scanned): Predominant SR w/ HR 61 to 142 bpm and  average 86 bpm.  Total of 59 episodes of PAT/PSVT  4 to 15 beats (not noted on diary).  Fastest was 5 beats at a rate of 193 bpm, longest was 15 beats at a rate of 106 bpm.  Rare isolated PACs (as well as couplets and triplets) with rare isolated PVCs.  No sustained arrhythmias or bradycardia to explain syncope.   ANTERIOR CERVICAL DECOMPRESSION/DISCECTOMY FUSION 4 LEVELS N/A 11/14/2013   Procedure: ANTERIOR CERVICAL DECOMPRESSION/DISCECTOMY FUSION 4 LEVELS;  Surgeon: Tressie Stalker, MD;  Location: [facility];  Service: Neurosurgery;  Laterality: N/A;  C34 C45 C56 C67 anterior cervical fusion with interbody prosthesis plating and  bonegraft   APPENDECTOMY  age 69   BREAST LUMPECTOMY  06/16/2009   left; SLN bx.   BREAST LUMPECTOMY  07/01/2009   re-excision   BREAST SURGERY  02/22/1997   reduction   CATARACT EXTRACTION, BILATERAL     COLONOSCOPY WITH PROPOFOL N/A 12/03/2014   Procedure: COLONOSCOPY WITH PROPOFOL;  Surgeon: Charolett Bumpers, MD;  Location: [facility];  Service: Endoscopy;  Laterality: N/A;   FOOT SURGERY  06/22/2011   left   KNEE ARTHROSCOPY  03/12/2005   right   KNEE ARTHROSCOPY  left   Lower Extremity Venous Dopplers  12/12/2020   No DVT bilaterally in the deep veins or superficial veins.  No deep or superficial venous reflux noted bilaterally with exception of Right SSV at the knee.   NASAL SINUS SURGERY     x 2   NEPHRECTOMY LIVING DONOR Left 04/22/2008   donated to spouse 2010([facility])   TRANSTHORACIC ECHOCARDIOGRAM  12/10/2020   EF 60 to 65%.  Normal LV size and function.  No heart WMA.  GR 1 DD.  Normal RV, RVP and RAP.[name]  Normal valves.   TRIGGER FINGER RELEASE  12/16/2011   Procedure: RELEASE TRIGGER FINGER/A-1 PULLEY;  Surgeon: Tami Ribas, MD;  Location: [facility];  Service: Orthopedics;  Laterality: Right;  RIGHT LONG FINGER TRIGGER RELEASE & ANNULAR CYST EXCISION   TUMOR EXCISION  age 6   right arm   Patient Active Problem List    Diagnosis Date Noted   Rectal bleeding 07/27/2022   Hemorrhoids 07/27/2022   Chronic pain of both knees 06/21/2022   Moderate persistent asthma without complication 03/27/2021   PSVT (paroxysmal supraventricular tachycardia) 11/26/2020   Syncope and collapse 11/24/2020   Primary osteoarthritis of right knee 05/12/2020   Primary osteoarthritis of left knee 05/12/2020   Hyperlipidemia    Bilateral calf pain 03/05/2019   Genetic testing 02/13/2019   Family history of ovarian cancer    Family history of bladder cancer    Family history of colon cancer    Family history of leukemia    Lumbar radiculopathy 12/08/2018   Myofascial pain 12/08/2018   Impaired gait and mobility 12/08/2018   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   Chronic nonallergic rhinitis 12/29/2016   Mild persistent asthma, uncomplicated 12/29/2016   Vocal fold paralysis, 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   Gastroesophageal reflux disease 12/29/2016   Cervical spondylosis with myelopathy and radiculopathy 11/14/2013   Essential tremor 06/13/2013   Cervical spondylosis without myelopathy 06/13/2013   Breast cancer of lower-outer quadrant of left female breast (HCC) 09/10/2010    ONSET DATE: 07/22/2022 (referral)  REFERRING DIAG: R26.81 (ICD-10-CM) - Gait instability R25.1 (ICD-10-CM) - Tremor R27.9 (ICD-10-CM) - Incoordination  THERAPY DIAG:  Unsteadiness on feet  Other abnormalities of gait and mobility  Tremor  Rationale for Evaluation and Treatment: Rehabilitation  SUBJECTIVE:                                                                                                                                                                                             SUBJECTIVE STATEMENT: Pt, who is well known to this therapist and clinic, presents to clinic without AD. States she is  happy she saw Dr. Arbutus Leas, has skin biopsy at end of the month. Thinks her balance has declined, has a cane that she uses in community but finds herself  furniture walking frequently. Has a rollator, but does not like it. Walks multiple laps around her cul-de-sac in the evenings.    Pt accompanied by: self  PERTINENT HISTORY: Acquired solitary kidney (04/2008), Asthma, Breast cancer (HCC) (05/2009), Cyst of finger (11/2011), Dental crowns present, Dermatitis, Frequency of urination, GERD (gastroesophageal reflux disease), Hemorrhoid, History of colon polyps (2009), History of shingles (1971), Hyperlipidemia, Hypertension, Liposarcoma of left shoulder (HCC) (1969), Parkinson's disease, PONV (postoperative nausea and vomiting), Seasonal allergies, Shingles of eyelid (1971), Tremors of nervous system, and Trigger finger of right hand (11/2011). here with essential tremor since 1990's. Now with intermittent resting tremor AND bradykinesia since 2016. May represent essential tremor with superimposed parkinson's disease + cervical myelopathy sequelae  PAIN:  Are you having pain? Yes: NPRS scale: 3/10 Pain location: Bilateral knees and calves Pain description: Achy/throbing  PRECAUTIONS: Fall  WEIGHT BEARING RESTRICTIONS: No  FALLS: Has patient fallen in last 6 months? No  LIVING ENVIRONMENT: Lives with: lives alone Lives in: House/apartment Stairs: Yes: Internal: full flight steps; bilateral but cannot reach both and External: 3 steps; on right going up, on left going up, and can reach both Has following equipment at home: Single point cane and Walker - 4 wheeled  PLOF: Independent  PATIENT GOALS: "Being able to control my balance better"  OBJECTIVE:   COGNITION: Overall cognitive status: Within functional limits for tasks assessed   SENSATION: Denies numbness/tingling in BUE/BLEs  COORDINATION: Heel to shin test: WNL bilaterally    POSTURE: rounded shoulders, forward head, and increased thoracic kyphosis  LOWER EXTREMITY ROM:     Active  Right Eval Left Eval  Hip flexion    Hip extension    Hip abduction    Hip adduction     Hip internal rotation    Hip external rotation    Knee flexion    Knee extension    Ankle dorsiflexion    Ankle plantarflexion    Ankle inversion    Ankle eversion     (Blank rows = not tested)  LOWER EXTREMITY MMT:  Tested in seated position   MMT Right Eval Left Eval  Hip flexion 4- 4  Hip extension    Hip abduction 4 4  Hip adduction 4 4  Hip internal rotation    Hip external rotation    Knee flexion 4- 4  Knee extension 4- 4  Ankle dorsiflexion 4 4  Ankle plantarflexion    Ankle inversion    Ankle eversion    (Blank rows = not tested)  BED MOBILITY:  Independent per pt   TRANSFERS: Assistive device utilized: None  Sit to stand: Modified independence Stand to sit: Modified independence   GAIT: Gait pattern:  lateral gait deviations, step through pattern, decreased arm swing- Right, decreased arm swing- Left, decreased stride length, knee flexed in stance- Right, knee flexed in stance- Left, lateral hip instability, trunk flexed, and narrow BOS Distance walked: Various clinic distances  Assistive device utilized: None Level of assistance: CGA and Min A Comments: Pt demonstrates increased instability to L side > R side and frequently relied on furniture walking to stabilize.   FUNCTIONAL TESTS:   [facility] PT Assessment - 07/29/22 1600       Transfers   Five time sit to stand comments  18.5s w/o UE support  Ambulation/Gait   Gait velocity 32.8' over 12.58s = 2.6 ft/s no AD      Timed Up and Go Test   Normal TUG (seconds) 11.78   no AD              TODAY'S TREATMENT:          Next Session                                                                                                                        PATIENT EDUCATION: Education details: POC, eval findings, brief discussion on DBS vs Focused ultrasound as pt inquiring about it  Person educated: Patient Education method: Explanation Education comprehension: verbalized  understanding  HOME EXERCISE PROGRAM: To be reviewed from   GOALS: Goals reviewed with patient? Yes  SHORT TERM GOALS: Target date: 08/26/2022   Pt will be independent with initial HEP for improved strength, balance, transfers and gait.  Baseline: to be reviewed from previous bout of therapy Goal status: INITIAL  2.  FGA to be assessed and STG/LTG updated  Baseline:  Goal status: INITIAL  3.  MCTSIB to be assessed and STG/LTG updated  Baseline:  Goal status: INITIAL  4.  Pt will trial various assistive devices in clinic to determine safest option for home use for fall prevention  Baseline:  Goal status: INITIAL  5.  Pt will improve gait velocity to at least 2.9 ft/s w/LRAD and SBA for improved gait efficiency and reduced fall risk   Baseline: 2.6 ft/s no AD Goal status: INITIAL  6.  Pt will teach back and demonstrate fall prevention techniques to implement at home and in community for reduced fall risk  Baseline:  Goal status: INITIAL  LONG TERM GOALS: Target date: 09/09/2022   Pt will be independent with final HEP for improved strength, balance, transfers and gait.  Baseline:  Goal status: INITIAL  2.  MCTSIB goal Baseline:  Goal status: INITIAL  3.  FGA goal  Baseline:  Goal status: INITIAL  4.  Pt will improve gait velocity to at least 3.1 ft/s w/LRAD mod I for improved gait efficiency and independence  Baseline: 2.6 ft/s no AD Goal status: INITIAL  5.  Pt will improve 5 x STS to less than or equal to 14 w/o UE support seconds to demonstrate improved functional strength and transfer efficiency.   Baseline: 18.5s no UE support Goal status: INITIAL   ASSESSMENT:  CLINICAL IMPRESSION: Patient is a 82 year old female referred to Neuro O[facility] for gait abnormality and tremor. Pt's PMH is significant for: Acquired solitary kidney (04/2008), Asthma, Breast cancer (HCC) (05/2009), Cyst of finger (11/2011), Dental crowns present, Dermatitis, Frequency of  urination, GERD (gastroesophageal reflux disease), Hemorrhoid, History of colon polyps (2009), History of shingles (1971), Hyperlipidemia, Hypertension, Liposarcoma of left shoulder (HCC) (1969), Parkinson's disease, PONV (postoperative nausea and vomiting), Seasonal allergies, Shingles of eyelid (1971), Tremors of nervous system, and Trigger finger of right hand (11/2011). The following deficits were  present during the exam: impaired balance, decreased strength, decreased coordination, gait impairments secondary to bilateral knee OA and parkinsonism. Based on tendency to furniture walk and gait speed, pt is an incr risk for falls. Pt would benefit from skilled PT to address these impairments and functional limitations to maximize functional mobility independence.    OBJECTIVE IMPAIRMENTS: Abnormal gait, decreased activity tolerance, decreased balance, decreased coordination, decreased endurance, decreased knowledge of use of DME, decreased mobility, difficulty walking, decreased ROM, decreased strength, improper body mechanics, postural dysfunction, and pain  ACTIVITY LIMITATIONS: carrying, lifting, bending, squatting, stairs, transfers, and locomotion level  PARTICIPATION LIMITATIONS: shopping, community activity, yard work, and church  PERSONAL FACTORS: Age, Fitness, and 1 comorbidity: essential tremor  are also affecting patient's functional outcome.   REHAB POTENTIAL: Good  CLINICAL DECISION MAKING: Evolving/moderate complexity  EVALUATION COMPLEXITY: Moderate  PLAN:  PT FREQUENCY: 1-2x/week  PT DURATION: 6 weeks  PLANNED INTERVENTIONS: Therapeutic exercises, Therapeutic activity, Neuromuscular re-education, Balance training, Gait training, Patient/Family education, Self Care, Joint mobilization, Stair training, Vestibular training, Canalith repositioning, DME instructions, Manual therapy, and Re-evaluation  PLAN FOR NEXT SESSION: FGA and MCTSIB and update goals. Review and update HEP  from previous bout of therapy. Trial various assistive devices to determine safest option.    Jill Alexanders Shatoria [place], PT, DPT 07/29/2022, 4:21 PM

## 2022-08-02 ENCOUNTER — Ambulatory Visit: Payer: Medicare Other | Admitting: Physical Therapy

## 2022-08-02 ENCOUNTER — Encounter: Payer: Self-pay | Admitting: Physical Therapy

## 2022-08-02 ENCOUNTER — Ambulatory Visit: Payer: Medicare Other | Admitting: Occupational Therapy

## 2022-08-02 DIAGNOSIS — R251 Tremor, unspecified: Secondary | ICD-10-CM

## 2022-08-02 DIAGNOSIS — R2689 Other abnormalities of gait and mobility: Secondary | ICD-10-CM | POA: Diagnosis not present

## 2022-08-02 DIAGNOSIS — R279 Unspecified lack of coordination: Secondary | ICD-10-CM

## 2022-08-02 DIAGNOSIS — R2681 Unsteadiness on feet: Secondary | ICD-10-CM | POA: Diagnosis not present

## 2022-08-02 DIAGNOSIS — R278 Other lack of coordination: Secondary | ICD-10-CM | POA: Diagnosis not present

## 2022-08-02 DIAGNOSIS — R293 Abnormal posture: Secondary | ICD-10-CM

## 2022-08-02 NOTE — Patient Instructions (Signed)
   PWR! Hand Exercises  Then, start with elbows bent and hands closed:  PWR! Hands: Push hands out BIG. Elbows straight, wrists up, fingers open and spread apart BIG.   PWR! Step: Touch index finger to thumb while keeping other fingers straight. Flick fingers out BIG (thumb out/straighten fingers). Repeat with other fingers. (Step your thumb to each finger).   With arms stretched out in front of you (elbows straight), perform the following:  PWR! Rock:  Move wrists up and down BIG  PWR! Twist: Twist palms up and down BIG    ** Make each movement big and deliberate so that you feel the movement.  Perform at least 10 repetitions 1x/day, but perform PWR! Hands throughout the day when you are having trouble using your hands (picking up/manipulating small objects, writing, eating, typing, sewing, buttoning, etc.).   

## 2022-08-02 NOTE — Therapy (Signed)
OUTPATIENT OCCUPATIONAL THERAPY TREATMENT  Patient Name: April Church MRN: [id number] DOB:[birth year], 82 y.o., female Today's Date: 08/02/2022  PCP: Lorenda Ishihara, MD  REFERRING PROVIDER: Vladimir Faster, DO   END OF SESSION:  OT End of Session - 08/02/22 1016     Visit Number 2    Number of Visits 9    Date for OT Re-Evaluation 08/27/22   currently only scheduled until 08/18/2022   Authorization Type Medicare    OT Start Time 1015    OT Stop Time 1058    OT Time Calculation (min) 43 min    Activity Tolerance Patient tolerated treatment well    Behavior During Therapy [facility] for tasks assessed/performed             Past Medical History:  Diagnosis Date   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 2011- no further tx. now- Dr. Donnie Coffin , Dr. Dayton Scrape   Cyst of finger 11/2011   annular cyst right long finger   Dental crowns present    Dermatitis    Frequency of urination    GERD (gastroesophageal reflux disease)    Hemorrhoid    History of colon polyps 2009   Also noted 2012 and 2017 by colonoscopy.   History of shingles 1971   Had right ischial recurrence in the March 2019   Hyperlipidemia    On atorvastatin   Hypertension    under control, has been on med. x 2 yrs.   Liposarcoma of left shoulder (HCC) 1969   Parkinson's disease    With mild tremor (neurologist Dr. Marjory Lies), neurosurgeon Dr. Lovell Sheehan   PONV (postoperative nausea and vomiting)    Seasonal allergies    Shingles of eyelid 1971   Right eyelid; recurrent -> last episode 05/11/2017   Tremors of nervous system    hands-essential tremor; associated with Parkinson's.   Trigger finger of right hand 11/2011   long finger   Past Surgical History:  Procedure Laterality Date   14-day Zio Patch Monitor  08/2020   (report to be scanned): Predominant SR w/  HR 61 to 142 bpm and average 86 bpm.  Total of 59 episodes of PAT/PSVT  4 to 15 beats (not noted on diary).  Fastest was 5 beats at a rate of 193 bpm, longest was 15 beats at a rate of 106 bpm.  Rare isolated PACs (as well as couplets and triplets) with rare isolated PVCs.  No sustained arrhythmias or bradycardia to explain syncope.   ANTERIOR CERVICAL DECOMPRESSION/DISCECTOMY FUSION 4 LEVELS N/A 11/14/2013   Procedure: ANTERIOR CERVICAL DECOMPRESSION/DISCECTOMY FUSION 4 LEVELS;  Surgeon: Tressie Stalker, MD;  Location: [facility];  Service: Neurosurgery;  Laterality: N/A;  C34 C45 C56 C67 anterior cervical fusion with interbody prosthesis plating and  bonegraft   APPENDECTOMY  age 5   BREAST LUMPECTOMY  06/16/2009   left; SLN bx.   BREAST LUMPECTOMY  07/01/2009   re-excision   BREAST SURGERY  02/22/1997   reduction   CATARACT EXTRACTION, BILATERAL     COLONOSCOPY WITH PROPOFOL N/A 12/03/2014   Procedure: COLONOSCOPY WITH PROPOFOL;  Surgeon: Charolett Bumpers, MD;  Location: [facility];  Service: Endoscopy;  Laterality: N/A;   FOOT SURGERY  06/22/2011   left   KNEE ARTHROSCOPY  03/12/2005   right   KNEE ARTHROSCOPY  left   Lower Extremity Venous Dopplers  12/12/2020   No DVT bilaterally in the deep veins or superficial veins.  No deep or superficial venous reflux noted bilaterally with exception of Right SSV at the knee.   NASAL SINUS SURGERY     x 2   NEPHRECTOMY LIVING DONOR Left 04/22/2008   donated to spouse 2010([facility])   TRANSTHORACIC ECHOCARDIOGRAM  12/10/2020   EF 60 to 65%.  Normal LV size and function.  No heart WMA.  GR 1 DD.  Normal RV, RVP and RAP.[name]  Normal valves.   TRIGGER FINGER RELEASE  12/16/2011   Procedure: RELEASE TRIGGER FINGER/A-1 PULLEY;  Surgeon: Tami Ribas, MD;  Location: [facility];  Service: Orthopedics;  Laterality: Right;  RIGHT LONG FINGER TRIGGER RELEASE & ANNULAR CYST EXCISION   TUMOR EXCISION  age 6   right arm   Patient  Active Problem List   Diagnosis Date Noted   Rectal bleeding 07/27/2022   Hemorrhoids 07/27/2022   Chronic pain of both knees 06/21/2022   Moderate persistent asthma without complication 03/27/2021   PSVT (paroxysmal supraventricular tachycardia) 11/26/2020   Syncope and collapse 11/24/2020   Primary osteoarthritis of right knee 05/12/2020   Primary osteoarthritis of left knee 05/12/2020   Hyperlipidemia    Bilateral calf pain 03/05/2019   Genetic testing 02/13/2019   Family history of ovarian cancer    Family history of bladder cancer    Family history of colon cancer    Family history of leukemia    Lumbar radiculopathy 12/08/2018   Myofascial pain 12/08/2018   Impaired gait and mobility 12/08/2018   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   Chronic nonallergic rhinitis 12/29/2016   Mild persistent asthma, uncomplicated 12/29/2016   Vocal fold paralysis, 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   Gastroesophageal reflux disease 12/29/2016   Cervical spondylosis with myelopathy and radiculopathy 11/14/2013   Essential tremor 06/13/2013   Cervical spondylosis without myelopathy 06/13/2013   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 She has gotten rid of her putty from 1 yr ago.   Pt accompanied by: self  PERTINENT HISTORY: Hx of essential tremor and previously diagnosed with Parkinson's; however, she is to undergo further testing to see if it confirms this diagnosis. Pt referred to OP OT following Parkinson's screen due to concerns with  UE coordination.   PRECAUTIONS: Fall  WEIGHT BEARING RESTRICTIONS: No  PAIN:  Are you having pain? Yes: NPRS scale: 3/10 Pain location: B knees  FALLS: Has patient fallen in last 6 months? No  LIVING ENVIRONMENT: Lives with: lives with their family and lives alone Lives in: House/apartment Stairs: Yes: Internal: 12-13 steps; can reach both and External: 3 steps; can reach both; she is able to live on main floor Has following equipment at home: Single point cane, Walker - 4 wheeled, shower chair, and Grab bars  PLOF: Independent; driving; retired Therapist, nutritional and Research officer, political party; reading; watching tv; visiting friends  PATIENT GOALS: to stay as independent as possible for as long as possible  OBJECTIVE:   HAND DOMINANCE: Right  ADLs: Overall ADLs: mod I  IADLs: Light housekeeping: hired help every 2 weeks  Handwriting: 75% legible and worse at end of day  MOBILITY STATUS: Independent and uses cane occassionally  POSTURE COMMENTS:  rounded shoulders  ACTIVITY TOLERANCE: Activity tolerance: good  FUNCTIONAL OUTCOME MEASURES: Fastening/unfastening 3 buttons: 36 Physical performance test: PPT#2 (simulated eating) 12 seconds & PPT#4 (donning/doffing jacket): 16  COORDINATION: 9 Hole Peg test: Right: 44 sec; Left: 32 sec  UE ROM:  WFL  UE MMT:   [facility]  08/02/2022 - Grip: R - 26.6 lbs; L - 27.1 lbs  SENSATION: WFL  MUSCLE TONE: RUE: Mild and Rigidity and LUE: Mild and Rigidity  COGNITION: Overall cognitive status: Within functional limits for tasks assessed  OBSERVATIONS: Dyskinesias and essential tremor   TODAY'S TREATMENT:                                                                                                                               OT reviewed UE HEP (progressing to red putty) as noted in pt instructions from previous therapy episodes. Therapist condensed HEPs to be more concise and promote carry-over as pt educated both tremor and PD to benefit  from these exercises and recommendations.    PATIENT EDUCATION: Education details: HEP Person educated: Patient Education method: Explanation, Demonstration, and Handouts Education comprehension: verbalized understanding and needs further education  HOME EXERCISE PROGRAM: 08/02/2022: [facility]! Hands and prior HEPs  GOALS:  LONG TERM GOALS: Target date: 08/27/2022    Pt will be independent with HEP.  Baseline: not yet initiated Goal status: In Progress  2.  Pt will verbalize understanding of adapted strategies to maximize safety and independence with ADLs/IADLs.  Baseline: not yet initiated Goal status: INITIAL  3.  Pt will demonstrate improved fine motor coordination for ADLs as evidenced by decreasing 9 hole peg test score for R hand by at least 10 secs  Baseline: 44 secs Goal status: INITIAL  5.  Pt will demonstrate improved ease with fastening buttons as evidenced by decreasing 3 button/unbutton time by at least 4 seconds  Baseline: 36 seconds Goal status: INITIAL  6.  Pt will write a short paragraph with no significant decrease in size and maintain 100% legibility.  Baseline: 75% legibility Goal status: INITIAL  7. Pt will demonstrate increased ease with dressing as evidenced by decreasing PPT#4 (don/ doff jacket) to 13 secs or less.  Baseline: 16 secs Goal status: INITIAL    ASSESSMENT:  CLINICAL IMPRESSION: Pt would benefit from skilled OT services in the outpatient setting to work on impairments as noted below to help maintain or improve current level of independence.  She will need to carry-over HEP more consistently to improve functional status.   PERFORMANCE DEFICITS: in functional skills including ADLs, IADLs, coordination, strength, Fine motor control, mobility, and UE functional use  IMPAIRMENTS: are limiting patient from ADLs, IADLs, and leisure.   COMORBIDITIES:  may have co-morbidities  that affects occupational  performance. Patient will benefit from  skilled OT to address above impairments and improve overall function.  REHAB POTENTIAL: Good  PLAN:  OT FREQUENCY: 1-2x/week  OT DURATION: 4 weeks  PLANNED INTERVENTIONS: self care/ADL training, therapeutic exercise, therapeutic activity, functional mobility training, patient/family education, coping strategies training, DME and/or AE instructions, and Re-evaluation  RECOMMENDED OTHER SERVICES: none at this time  CONSULTED AND AGREED WITH PLAN OF CARE: Patient  PLAN FOR NEXT SESSION: Finish reviewing previous UE HEP focusing on tremor management/coordination; WRITING   Delana Meyer, OT 08/02/2022, 3:10 PM

## 2022-08-02 NOTE — Therapy (Signed)
OUTPATIENT PHYSICAL THERAPY NEURO TREATMENT   Patient Name: April Church MRN: [id number] DOB:Oct 09, 1940, 82 y.o., female Today's Date: 08/02/2022   PCP: Lorenda Ishihara, MD REFERRING PROVIDER: Vladimir Faster, DO  END OF SESSION:  PT End of Session - 08/02/22 0931     Visit Number 2    Number of Visits 13   Plus eval   Date for PT Re-Evaluation 09/09/22    Authorization Type Medicare A & B    PT Start Time 0930    PT Stop Time 1012    PT Time Calculation (min) 42 min    Equipment Utilized During Treatment Gait belt    Activity Tolerance Patient tolerated treatment well    Behavior During Therapy WFL for tasks assessed/performed             Past Medical History:  Diagnosis Date   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 2011- no further tx. now- Dr. Donnie Coffin , Dr. Dayton Scrape   Cyst of finger 11/2011   annular cyst right long finger   Dental crowns present    Dermatitis    Frequency of urination    GERD (gastroesophageal reflux disease)    Hemorrhoid    History of colon polyps 2009   Also noted 2012 and 2017 by colonoscopy.   History of shingles 1971   Had right ischial recurrence in the March 2019   Hyperlipidemia    On atorvastatin   Hypertension    under control, has been on med. x 2 yrs.   Liposarcoma of left shoulder (HCC) 1969   Parkinson's disease    With mild tremor (neurologist Dr. Marjory Lies), neurosurgeon Dr. Lovell Sheehan   PONV (postoperative nausea and vomiting)    Seasonal allergies    Shingles of eyelid 1971   Right eyelid; recurrent -> last episode 05/11/2017   Tremors of nervous system    hands-essential tremor; associated with Parkinson's.   Trigger finger of right hand 11/2011   long finger   Past Surgical History:  Procedure Laterality Date   14-day Zio Patch Monitor  08/2020   (report to  be scanned): Predominant SR w/ HR 61 to 142 bpm and average 86 bpm.  Total of 59 episodes of PAT/PSVT  4 to 15 beats (not noted on diary).  Fastest was 5 beats at a rate of 193 bpm, longest was 15 beats at a rate of 106 bpm.  Rare isolated PACs (as well as couplets and triplets) with rare isolated PVCs.  No sustained arrhythmias or bradycardia to explain syncope.   ANTERIOR CERVICAL DECOMPRESSION/DISCECTOMY FUSION 4 LEVELS N/A 11/14/2013   Procedure: ANTERIOR CERVICAL DECOMPRESSION/DISCECTOMY FUSION 4 LEVELS;  Surgeon: Tressie Stalker, MD;  Location: [facility];  Service: Neurosurgery;  Laterality: N/A;  C34 C45 C56 C67 anterior cervical fusion with interbody prosthesis plating and  bonegraft   APPENDECTOMY  age 83   BREAST LUMPECTOMY  06/16/2009   left; SLN bx.   BREAST LUMPECTOMY  07/01/2009   re-excision   BREAST SURGERY  02/22/1997   reduction   CATARACT EXTRACTION, BILATERAL     COLONOSCOPY WITH PROPOFOL N/A 12/03/2014   Procedure: COLONOSCOPY WITH PROPOFOL;  Surgeon: Charolett Bumpers, MD;  Location: [facility];  Service: Endoscopy;  Laterality: N/A;   FOOT SURGERY  06/22/2011   left   KNEE ARTHROSCOPY  03/12/2005  right   KNEE ARTHROSCOPY     left   Lower Extremity Venous Dopplers  12/12/2020   No DVT bilaterally in the deep veins or superficial veins.  No deep or superficial venous reflux noted bilaterally with exception of Right SSV at the knee.   NASAL SINUS SURGERY     x 2   NEPHRECTOMY LIVING DONOR Left 04/22/2008   donated to spouse 2010([facility])   TRANSTHORACIC ECHOCARDIOGRAM  12/10/2020   EF 60 to 65%.  Normal LV size and function.  No heart WMA.  GR 1 DD.  Normal RV, RVP and RAP.[name]  Normal valves.   TRIGGER FINGER RELEASE  12/16/2011   Procedure: RELEASE TRIGGER FINGER/A-1 PULLEY;  Surgeon: Tami Ribas, MD;  Location: [facility];  Service: Orthopedics;  Laterality: Right;  RIGHT LONG FINGER TRIGGER RELEASE & ANNULAR CYST EXCISION   TUMOR EXCISION  age  53   right arm   Patient Active Problem List   Diagnosis Date Noted   Rectal bleeding 07/27/2022   Hemorrhoids 07/27/2022   Chronic pain of both knees 06/21/2022   Moderate persistent asthma without complication 03/27/2021   PSVT (paroxysmal supraventricular tachycardia) 11/26/2020   Syncope and collapse 11/24/2020   Primary osteoarthritis of right knee 05/12/2020   Primary osteoarthritis of left knee 05/12/2020   Hyperlipidemia    Bilateral calf pain 03/05/2019   Genetic testing 02/13/2019   Family history of ovarian cancer    Family history of bladder cancer    Family history of colon cancer    Family history of leukemia    Lumbar radiculopathy 12/08/2018   Myofascial pain 12/08/2018   Impaired gait and mobility 12/08/2018   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   Chronic nonallergic rhinitis 12/29/2016   Mild persistent asthma, uncomplicated 12/29/2016   Vocal fold paralysis, 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   Gastroesophageal reflux disease 12/29/2016   Cervical spondylosis with myelopathy and radiculopathy 11/14/2013   Essential tremor 06/13/2013   Cervical spondylosis without myelopathy 06/13/2013   Breast cancer of lower-outer quadrant of left female breast (HCC) 09/10/2010    ONSET DATE: 07/22/2022 (referral)  REFERRING DIAG: R26.81 (ICD-10-CM) - Gait instability R25.1 (ICD-10-CM) - Tremor R27.9 (ICD-10-CM) - Incoordination  THERAPY DIAG:  Unsteadiness on feet  Other abnormalities of gait and mobility  Other lack of coordination  Abnormal posture  Rationale for Evaluation and Treatment: Rehabilitation  SUBJECTIVE:                                                                                                                                                                                             SUBJECTIVE STATEMENT: Will go for  a skin biopsy in a couple weeks. Using a cane to help go on walks outside. Goes to Dr. Berline Chough for knee injections, but reports that these don't  last.   Pt accompanied by: self  PERTINENT HISTORY: Acquired solitary kidney (04/2008), Asthma, Breast cancer (HCC) (05/2009), Cyst of finger (11/2011), Dental crowns present, Dermatitis, Frequency of urination, GERD (gastroesophageal reflux disease), Hemorrhoid, History of colon polyps (2009), History of shingles (1971), Hyperlipidemia, Hypertension, Liposarcoma of left shoulder (HCC) (1969), Parkinson's disease, PONV (postoperative nausea and vomiting), Seasonal allergies, Shingles of eyelid (1971), Tremors of nervous system, and Trigger finger of right hand (11/2011). here with essential tremor since 1990's. Now with intermittent resting tremor AND bradykinesia since 2016. May represent essential tremor with superimposed parkinson's disease + cervical myelopathy sequelae  PAIN:  Are you having pain? Yes: NPRS scale: 5/10 Pain location: Bilateral knees and calves Pain description: Achy/throbing  PRECAUTIONS: Fall  WEIGHT BEARING RESTRICTIONS: No  FALLS: Has patient fallen in last 6 months? No  LIVING ENVIRONMENT: Lives with: lives alone Lives in: House/apartment Stairs: Yes: Internal: full flight steps; bilateral but cannot reach both and External: 3 steps; on right going up, on left going up, and can reach both Has following equipment at home: Single point cane and Walker - 4 wheeled  PLOF: Independent  PATIENT GOALS: "Being able to control my balance better"  OBJECTIVE:   COGNITION: Overall cognitive status: Within functional limits for tasks assessed   SENSATION: Denies numbness/tingling in BUE/BLEs  COORDINATION: Heel to shin test: WNL bilaterally    POSTURE: rounded shoulders, forward head, and increased thoracic kyphosis  LOWER EXTREMITY ROM:     Active  Right Eval Left Eval  Hip flexion    Hip extension    Hip abduction    Hip adduction    Hip internal rotation    Hip external rotation    Knee flexion    Knee extension    Ankle dorsiflexion    Ankle  plantarflexion    Ankle inversion    Ankle eversion     (Blank rows = not tested)  LOWER EXTREMITY MMT:  Tested in seated position   MMT Right Eval Left Eval  Hip flexion 4- 4  Hip extension    Hip abduction 4 4  Hip adduction 4 4  Hip internal rotation    Hip external rotation    Knee flexion 4- 4  Knee extension 4- 4  Ankle dorsiflexion 4 4  Ankle plantarflexion    Ankle inversion    Ankle eversion    (Blank rows = not tested)  BED MOBILITY:  Independent per pt   TRANSFERS: Assistive device utilized: None  Sit to stand: Modified independence Stand to sit: Modified independence   GAIT: Gait pattern:  lateral gait deviations, step through pattern, decreased arm swing- Right, decreased arm swing- Left, decreased stride length, knee flexed in stance- Right, knee flexed in stance- Left, lateral hip instability, trunk flexed, and narrow BOS Distance walked: Various clinic distances  Assistive device utilized: None Level of assistance: CGA and Min A Comments: Pt demonstrates increased instability to L side > R side and frequently relied on furniture walking to stabilize.   TODAY'S TREATMENT:                [facility] PT Assessment - 08/02/22 0934       Functional Gait  Assessment   Gait assessed  Yes    Gait Level Surface Walks 20 ft, slow speed, abnormal gait pattern, evidence  for imbalance or deviates 10-15 in outside of the 12 in walkway width. Requires more than 7 sec to ambulate 20 ft.   7.97   Change in Gait Speed Able to change speed, demonstrates mild gait deviations, deviates 6-10 in outside of the 12 in walkway width, or no gait deviations, unable to achieve a major change in velocity, or uses a change in velocity, or uses an assistive device.    Gait with Horizontal Head Turns Performs head turns with moderate changes in gait velocity, slows down, deviates 10-15 in outside 12 in walkway width but recovers, can continue to walk.    Gait with Vertical Head Turns  Performs task with moderate change in gait velocity, slows down, deviates 10-15 in outside 12 in walkway width but recovers, can continue to walk.    Gait and Pivot Turn Pivot turns safely in greater than 3 sec and stops with no loss of balance, or pivot turns safely within 3 sec and stops with mild imbalance, requires small steps to catch balance.    Step Over Obstacle Is able to step over one shoe box (4.5 in total height) but must slow down and adjust steps to clear box safely. May require verbal cueing.    Gait with Narrow Base of Support Ambulates 7-9 steps.    Gait with Eyes Closed Walks 20 ft, slow speed, abnormal gait pattern, evidence for imbalance, deviates 10-15 in outside 12 in walkway width. Requires more than 9 sec to ambulate 20 ft.    Ambulating Backwards Walks 20 ft, slow speed, abnormal gait pattern, evidence for imbalance, deviates 10-15 in outside 12 in walkway width.   26.22 seconds   Steps Alternating feet, must use rail.    Total Score 14              M-CTSIB  Condition 1: Firm Surface, EO 30 Sec, Normal Sway  Condition 2: Firm Surface, EC 30 Sec, Normal Sway  Condition 3: Foam Surface, EO 30 Sec, Mild Sway  Condition 4: Foam Surface, EC 3-4 Sec      Access Code: [email or web address] URL: [email or web address] Date: 08/02/2022 Prepared by: Sherlie Ban  Exercises - Seated Hamstring Stretch  - 1 x daily - 5 x weekly - 1 sets - 3 reps - 30 sec hold - Sit to Stand Without Arm Support  - 1 x daily - 7 x weekly - 3 sets - 10 reps - Standing Balance with Eyes Closed on Foam  - 1 x daily - 5 x weekly - 3 sets - 30 hold - Romberg Stance on Foam Pad with Head Rotation  - 1 x daily - 5 x weekly - 2 sets - 10 reps - Walking with Head Rotation  - 1 x daily - 5 x weekly - 3 sets - Side to Side Weight Shift with Overhead Reach and Counter Support  - 1 x daily - 5 x weekly - 1 sets - 10 reps  PATIENT EDUCATION: Education details: findings from balance testing, review and update of previous HEP and provided new handout, need for AD due to fall risk - will further practice in clinic to decide what is best (pt is not too fond of the rollator) Person educated: Patient Education method: Explanation, Demonstration, and Handouts Education comprehension: verbalized understanding, returned demonstration, and needs further education  HOME EXERCISE PROGRAM: Access Code: [email or web address] URL: [email or web address] Date: 08/02/2022 Prepared by: Sherlie Ban  Exercises - Seated Hamstring Stretch  - 1 x daily - 5 x weekly - 1 sets - 3 reps - 30 sec hold - Sit to Stand Without Arm Support  - 1 x daily - 7 x weekly - 3 sets - 10 reps - Standing Balance with Eyes Closed on Foam  - 1 x daily - 5 x weekly - 3 sets - 30 hold - Romberg Stance on Foam Pad with Head Rotation  - 1 x daily - 5 x weekly - 2 sets - 10 reps - Walking with Head Rotation  - 1 x daily - 5 x weekly - 3 sets - Side to Side Weight Shift with Overhead Reach and Counter Support  - 1 x daily - 5 x weekly - 1 sets - 10 reps   GOALS: Goals reviewed with patient? Yes  SHORT TERM GOALS: Target date: 08/26/2022   Pt will be independent with initial HEP for improved strength, balance, transfers and gait.  Baseline: to be reviewed from previous bout of therapy Goal status: INITIAL  2.  Pt will improve FGA to at least a 17/30 in order to demo decr fall risk. Baseline: 14/30 Goal status: INITIAL  3.  Pt will improve condition 4 of mCTSIB to at least 8 seconds in order to demo improved vestibular input for balance.  Baseline: 3-4 seconds  Goal status: INITIAL  4.  Pt will trial various assistive devices in clinic to determine safest option for home use for fall prevention  Baseline:  Goal status: INITIAL  5.  Pt will improve gait velocity to at least 2.9 ft/s w/LRAD and SBA for improved  gait efficiency and reduced fall risk   Baseline: 2.6 ft/s no AD Goal status: INITIAL  6.  Pt will teach back and demonstrate fall prevention techniques to implement at home and in community for reduced fall risk  Baseline:  Goal status: INITIAL  LONG TERM GOALS: Target date: 09/09/2022   Pt will be independent with final HEP for improved strength, balance, transfers and gait.  Baseline:  Goal status: INITIAL  2.  Pt will improve condition 4 of mCTSIB to at least 12 seconds in order to demo improved vestibular input for balance. Baseline: 3-4 seconds  Goal status: INITIAL  3.  Pt will improve FGA to at least a 20/30 in order to demo decr fall risk. Baseline: 14/30 Goal status: INITIAL  4.  Pt will improve gait velocity to at least 3.1 ft/s w/LRAD mod I for improved gait efficiency and independence  Baseline: 2.6 ft/s no AD Goal status: INITIAL  5.  Pt will improve 5 x STS to less than or equal to 14 w/o UE support seconds to demonstrate improved functional strength and transfer efficiency.   Baseline: 18.5s no UE support Goal status: INITIAL   ASSESSMENT:  CLINICAL IMPRESSION: Today's skilled session focused on further assessing balance with FGA and mCTSIB. Pt scored a 14/30 on the FGA, indicating a high fall risk, performed with no AD. Pt only able to hold condition  4 of mCTSIB for 3-4 seconds, indicating significantly decr vestibular input for balance. LTG updated as appropriate. Pt also unsteady on compliant surfaces. Updated and reviewed HEP for balance deficits, functional strength and hamstring ROM. Provided new handout. Educated on use of an AD to decr fall risk, will further practice in future session to determine best option for pt. Will continue per POC.    OBJECTIVE IMPAIRMENTS: Abnormal gait, decreased activity tolerance, decreased balance, decreased coordination, decreased endurance, decreased knowledge of use of DME, decreased mobility, difficulty walking,  decreased ROM, decreased strength, improper body mechanics, postural dysfunction, and pain  ACTIVITY LIMITATIONS: carrying, lifting, bending, squatting, stairs, transfers, and locomotion level  PARTICIPATION LIMITATIONS: shopping, community activity, yard work, and church  PERSONAL FACTORS: Age, Fitness, and 1 comorbidity: essential tremor  are also affecting patient's functional outcome.   REHAB POTENTIAL: Good  CLINICAL DECISION MAKING: Evolving/moderate complexity  EVALUATION COMPLEXITY: Moderate  PLAN:  PT FREQUENCY: 1-2x/week  PT DURATION: 6 weeks  PLANNED INTERVENTIONS: Therapeutic exercises, Therapeutic activity, Neuromuscular re-education, Balance training, Gait training, Patient/Family education, Self Care, Joint mobilization, Stair training, Vestibular training, Canalith repositioning, DME instructions, Manual therapy, and Re-evaluation  PLAN FOR NEXT SESSION: Trial various assistive devices to determine safest option. Work on balance on compliant surfaces, retro gait, head motions, obstacles, SLS    Drake Leach, PT, DPT 08/02/2022, 11:38 AM

## 2022-08-09 ENCOUNTER — Ambulatory Visit: Payer: Medicare Other | Admitting: Physical Therapy

## 2022-08-09 ENCOUNTER — Ambulatory Visit: Payer: Medicare Other | Admitting: Occupational Therapy

## 2022-08-09 ENCOUNTER — Encounter: Payer: Self-pay | Admitting: Physical Therapy

## 2022-08-09 DIAGNOSIS — R2681 Unsteadiness on feet: Secondary | ICD-10-CM

## 2022-08-09 DIAGNOSIS — R278 Other lack of coordination: Secondary | ICD-10-CM

## 2022-08-09 DIAGNOSIS — R251 Tremor, unspecified: Secondary | ICD-10-CM

## 2022-08-09 DIAGNOSIS — R2689 Other abnormalities of gait and mobility: Secondary | ICD-10-CM | POA: Diagnosis not present

## 2022-08-09 DIAGNOSIS — R293 Abnormal posture: Secondary | ICD-10-CM

## 2022-08-09 DIAGNOSIS — R279 Unspecified lack of coordination: Secondary | ICD-10-CM

## 2022-08-09 NOTE — Therapy (Signed)
OUTPATIENT PHYSICAL THERAPY NEURO TREATMENT   Patient Name: April Church MRN: [id number] DOB:1940-07-04, 82 y.o., female Today's Date: 08/09/2022   PCP: Lorenda Ishihara, MD REFERRING PROVIDER: Vladimir Faster, DO  END OF SESSION:  PT End of Session - 08/09/22 1319     Visit Number 3    Number of Visits 13   Plus eval   Date for PT Re-Evaluation 09/09/22    Authorization Type Medicare A & B    PT Start Time 1317    PT Stop Time 1359    PT Time Calculation (min) 42 min    Equipment Utilized During Treatment Gait belt    Activity Tolerance Patient tolerated treatment well    Behavior During Therapy WFL for tasks assessed/performed             Past Medical History:  Diagnosis Date   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 2011- no further tx. now- Dr. Donnie Coffin , Dr. Dayton Scrape   Cyst of finger 11/2011   annular cyst right long finger   Dental crowns present    Dermatitis    Frequency of urination    GERD (gastroesophageal reflux disease)    Hemorrhoid    History of colon polyps 2009   Also noted 2012 and 2017 by colonoscopy.   History of shingles 1971   Had right ischial recurrence in the March 2019   Hyperlipidemia    On atorvastatin   Hypertension    under control, has been on med. x 2 yrs.   Liposarcoma of left shoulder (HCC) 1969   Parkinson's disease    With mild tremor (neurologist Dr. Marjory Lies), neurosurgeon Dr. Lovell Sheehan   PONV (postoperative nausea and vomiting)    Seasonal allergies    Shingles of eyelid 1971   Right eyelid; recurrent -> last episode 05/11/2017   Tremors of nervous system    hands-essential tremor; associated with Parkinson's.   Trigger finger of right hand 11/2011   long finger   Past Surgical History:  Procedure Laterality Date   14-day Zio Patch Monitor  08/2020   (report to  be scanned): Predominant SR w/ HR 61 to 142 bpm and average 86 bpm.  Total of 59 episodes of PAT/PSVT  4 to 15 beats (not noted on diary).  Fastest was 5 beats at a rate of 193 bpm, longest was 15 beats at a rate of 106 bpm.  Rare isolated PACs (as well as couplets and triplets) with rare isolated PVCs.  No sustained arrhythmias or bradycardia to explain syncope.   ANTERIOR CERVICAL DECOMPRESSION/DISCECTOMY FUSION 4 LEVELS N/A 11/14/2013   Procedure: ANTERIOR CERVICAL DECOMPRESSION/DISCECTOMY FUSION 4 LEVELS;  Surgeon: Tressie Stalker, MD;  Location: [facility];  Service: Neurosurgery;  Laterality: N/A;  C34 C45 C56 C67 anterior cervical fusion with interbody prosthesis plating and  bonegraft   APPENDECTOMY  age 86   BREAST LUMPECTOMY  06/16/2009   left; SLN bx.   BREAST LUMPECTOMY  07/01/2009   re-excision   BREAST SURGERY  02/22/1997   reduction   CATARACT EXTRACTION, BILATERAL     COLONOSCOPY WITH PROPOFOL N/A 12/03/2014   Procedure: COLONOSCOPY WITH PROPOFOL;  Surgeon: Charolett Bumpers, MD;  Location: [facility];  Service: Endoscopy;  Laterality: N/A;   FOOT SURGERY  06/22/2011   left   KNEE ARTHROSCOPY  03/12/2005  right   KNEE ARTHROSCOPY     left   Lower Extremity Venous Dopplers  12/12/2020   No DVT bilaterally in the deep veins or superficial veins.  No deep or superficial venous reflux noted bilaterally with exception of Right SSV at the knee.   NASAL SINUS SURGERY     x 2   NEPHRECTOMY LIVING DONOR Left 04/22/2008   donated to spouse 2010([facility])   TRANSTHORACIC ECHOCARDIOGRAM  12/10/2020   EF 60 to 65%.  Normal LV size and function.  No heart WMA.  GR 1 DD.  Normal RV, RVP and RAP.[name]  Normal valves.   TRIGGER FINGER RELEASE  12/16/2011   Procedure: RELEASE TRIGGER FINGER/A-1 PULLEY;  Surgeon: Tami Ribas, MD;  Location: [facility];  Service: Orthopedics;  Laterality: Right;  RIGHT LONG FINGER TRIGGER RELEASE & ANNULAR CYST EXCISION   TUMOR EXCISION  age  89   right arm   Patient Active Problem List   Diagnosis Date Noted   Rectal bleeding 07/27/2022   Hemorrhoids 07/27/2022   Chronic pain of both knees 06/21/2022   Moderate persistent asthma without complication 03/27/2021   PSVT (paroxysmal supraventricular tachycardia) 11/26/2020   Syncope and collapse 11/24/2020   Primary osteoarthritis of right knee 05/12/2020   Primary osteoarthritis of left knee 05/12/2020   Hyperlipidemia    Bilateral calf pain 03/05/2019   Genetic testing 02/13/2019   Family history of ovarian cancer    Family history of bladder cancer    Family history of colon cancer    Family history of leukemia    Lumbar radiculopathy 12/08/2018   Myofascial pain 12/08/2018   Impaired gait and mobility 12/08/2018   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   Chronic nonallergic rhinitis 12/29/2016   Mild persistent asthma, uncomplicated 12/29/2016   Vocal fold paralysis, 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   Gastroesophageal reflux disease 12/29/2016   Cervical spondylosis with myelopathy and radiculopathy 11/14/2013   Essential tremor 06/13/2013   Cervical spondylosis without myelopathy 06/13/2013   Breast cancer of lower-outer quadrant of left female breast (HCC) 09/10/2010    ONSET DATE: 07/22/2022 (referral)  REFERRING DIAG: R26.81 (ICD-10-CM) - Gait instability R25.1 (ICD-10-CM) - Tremor R27.9 (ICD-10-CM) - Incoordination  THERAPY DIAG:  Other lack of coordination  Unsteadiness on feet  Other abnormalities of gait and mobility  Abnormal posture  Rationale for Evaluation and Treatment: Rehabilitation  SUBJECTIVE:                                                                                                                                                                                             SUBJECTIVE STATEMENT: No changes, no  falls. Had a good weekend. Brought in her cane from home. Tried her exercises at home.     Pt accompanied by: self  PERTINENT HISTORY:  Acquired solitary kidney (04/2008), Asthma, Breast cancer (HCC) (05/2009), Cyst of finger (11/2011), Dental crowns present, Dermatitis, Frequency of urination, GERD (gastroesophageal reflux disease), Hemorrhoid, History of colon polyps (2009), History of shingles (1971), Hyperlipidemia, Hypertension, Liposarcoma of left shoulder (HCC) (1969), Parkinson's disease, PONV (postoperative nausea and vomiting), Seasonal allergies, Shingles of eyelid (1971), Tremors of nervous system, and Trigger finger of right hand (11/2011). here with essential tremor since 1990's. Now with intermittent resting tremor AND bradykinesia since 2016. May represent essential tremor with superimposed parkinson's disease + cervical myelopathy sequelae  PAIN:  Are you having pain? No  PRECAUTIONS: Fall  WEIGHT BEARING RESTRICTIONS: No  FALLS: Has patient fallen in last 6 months? No  LIVING ENVIRONMENT: Lives with: lives alone Lives in: House/apartment Stairs: Yes: Internal: full flight steps; bilateral but cannot reach both and External: 3 steps; on right going up, on left going up, and can reach both Has following equipment at home: Single point cane and Walker - 4 wheeled  PLOF: Independent  PATIENT GOALS: "Being able to control my balance better"  OBJECTIVE:   COGNITION: Overall cognitive status: Within functional limits for tasks assessed   SENSATION: Denies numbness/tingling in BUE/BLEs  COORDINATION: Heel to shin test: WNL bilaterally    POSTURE: rounded shoulders, forward head, and increased thoracic kyphosis  LOWER EXTREMITY ROM:     Active  Right Eval Left Eval  Hip flexion    Hip extension    Hip abduction    Hip adduction    Hip internal rotation    Hip external rotation    Knee flexion    Knee extension    Ankle dorsiflexion    Ankle plantarflexion    Ankle inversion    Ankle eversion     (Blank rows = not tested)  LOWER EXTREMITY MMT:  Tested in seated position   MMT  Right Eval Left Eval  Hip flexion 4- 4  Hip extension    Hip abduction 4 4  Hip adduction 4 4  Hip internal rotation    Hip external rotation    Knee flexion 4- 4  Knee extension 4- 4  Ankle dorsiflexion 4 4  Ankle plantarflexion    Ankle inversion    Ankle eversion    (Blank rows = not tested)  BED MOBILITY:  Independent per pt   TRANSFERS: Assistive device utilized: None  Sit to stand: Modified independence Stand to sit: Modified independence   GAIT: Gait pattern:  lateral gait deviations, step through pattern, decreased arm swing- Right, decreased arm swing- Left, decreased stride length, knee flexed in stance- Right, knee flexed in stance- Left, lateral hip instability, trunk flexed, and narrow BOS Distance walked: Various clinic distances  Assistive device utilized: None Level of assistance: CGA and Min A Comments: Pt demonstrates increased instability to L side > R side and frequently relied on furniture walking to stabilize.   TODAY'S TREATMENT:             GAIT: Gait pattern: lateral gait deviations, step through pattern, decreased arm swing- Right, decreased arm swing- Left, decreased stride length, knee flexed in stance- Right, knee flexed in stance- Left, lateral hip instability, trunk flexed, and narrow BOS Distance walked: Various clinic distances  Assistive device utilized: 230' x 1 with SPC indoors, 230' x 1 with walking stick indoors, 300' x 1  with walking stick outdoors  Level of assistance: Supervision/CGA Comments: Pt brought in her [facility] from home. Pt initially holding it in LUE and pt with tendency to get off sequencing despite cues. Trialed it instead in RUE with pt doing better with sequencing and cane placement. Discussed that this would be the safest way to have cane in hand.   Also trialed single walking stick in RUE to compare the differences. Adjusted to proper height and worked on sequencing on level ground. Cued for posture throughout gait  training and looking ahead vs. Just down at the ground. Pt feeling steady with walking stick and worked on gait outdoors on paved surfaces with pt sometimes getting off sequence. Cues to stop and reset when pt does get off and then continue walking with tall posture and incr step length. Pt only needing supervision when ambulating with either cane or SPC. Discussed importance of use of an AD at all times for balance - in both the community and in her house (pt currently furniture walks)   NMR:  On blue foam beam in // bars: With one leg as stance leg on foam, stepping other leg forwards and backwards, performed x15 reps each leg. Pt more challenged when having to step with RLE, needing to use frequent UE support for balance  Alternating lateral step and weight shift x12 reps bilat, pt intermittently needing to use bars for balance due to losing it anteriorly.                                                     PATIENT EDUCATION: Education details: Where to purchase walking stick, pt can have a cane in the house and keep either cane/walking stick in the car so she always makes sure that she has an AD with her, use of AD at all times for safety/decr fall risk. Had discussion with pt that it is hard for her to bring the rollator out in the community as she has a very tough time trying to fold it up in her car to take it with her.  Person educated: Patient Education method: Explanation, Demonstration, and Handouts Education comprehension: verbalized understanding, returned demonstration, and needs further education  HOME EXERCISE PROGRAM: Access Code: [email or web address] URL: [email or web address] Date: 08/02/2022 Prepared by: Sherlie Ban  Exercises - Seated Hamstring Stretch  - 1 x daily - 5 x weekly - 1 sets - 3 reps - 30 sec hold - Sit to Stand Without Arm Support  - 1 x daily - 7 x weekly - 3 sets - 10 reps - Standing Balance with Eyes Closed on Foam  - 1 x daily - 5 x weekly - 3 sets -  30 hold - Romberg Stance on Foam Pad with Head Rotation  - 1 x daily - 5 x weekly - 2 sets - 10 reps - Walking with Head Rotation  - 1 x daily - 5 x weekly - 3 sets - Side to Side Weight Shift with Overhead Reach and Counter Support  - 1 x daily - 5 x weekly - 1 sets - 10 reps   GOALS: Goals reviewed with patient? Yes  SHORT TERM GOALS: Target date: 08/26/2022   Pt will be independent with initial HEP for improved strength, balance, transfers and gait.  Baseline: to be reviewed from previous bout of therapy Goal  status: INITIAL  2.  Pt will improve FGA to at least a 17/30 in order to demo decr fall risk. Baseline: 14/30 Goal status: INITIAL  3.  Pt will improve condition 4 of mCTSIB to at least 8 seconds in order to demo improved vestibular input for balance.  Baseline: 3-4 seconds  Goal status: INITIAL  4.  Pt will trial various assistive devices in clinic to determine safest option for home use for fall prevention  Baseline:  Goal status: INITIAL  5.  Pt will improve gait velocity to at least 2.9 ft/s w/LRAD and SBA for improved gait efficiency and reduced fall risk   Baseline: 2.6 ft/s no AD Goal status: INITIAL  6.  Pt will teach back and demonstrate fall prevention techniques to implement at home and in community for reduced fall risk  Baseline:  Goal status: INITIAL  LONG TERM GOALS: Target date: 09/09/2022   Pt will be independent with final HEP for improved strength, balance, transfers and gait.  Baseline:  Goal status: INITIAL  2.  Pt will improve condition 4 of mCTSIB to at least 12 seconds in order to demo improved vestibular input for balance. Baseline: 3-4 seconds  Goal status: INITIAL  3.  Pt will improve FGA to at least a 20/30 in order to demo decr fall risk. Baseline: 14/30 Goal status: INITIAL  4.  Pt will improve gait velocity to at least 3.1 ft/s w/LRAD mod I for improved gait efficiency and independence  Baseline: 2.6 ft/s no AD Goal status:  INITIAL  5.  Pt will improve 5 x STS to less than or equal to 14 w/o UE support seconds to demonstrate improved functional strength and transfer efficiency.   Baseline: 18.5s no UE support Goal status: INITIAL   ASSESSMENT:  CLINICAL IMPRESSION: Today's skilled session focused on most appropriate AD for pt to use in her house/the community. Pt has a rollator but does not like to use it and also reports difficulty with getting in and out of the car for the community. Pt brought in [facility] from home with therapist going over sequencing with pt and pt doing better having cane in RUE. Also trialed single walking stick with pt also reporting steady with this and could use for outdoor distances. Showed pt where to purchase. Pt needing cues during gait for posture and looking ahead and educated on use of AD at all times for safety. Will continue per POC.    OBJECTIVE IMPAIRMENTS: Abnormal gait, decreased activity tolerance, decreased balance, decreased coordination, decreased endurance, decreased knowledge of use of DME, decreased mobility, difficulty walking, decreased ROM, decreased strength, improper body mechanics, postural dysfunction, and pain  ACTIVITY LIMITATIONS: carrying, lifting, bending, squatting, stairs, transfers, and locomotion level  PARTICIPATION LIMITATIONS: shopping, community activity, yard work, and church  PERSONAL FACTORS: Age, Fitness, and 1 comorbidity: essential tremor  are also affecting patient's functional outcome.   REHAB POTENTIAL: Good  CLINICAL DECISION MAKING: Evolving/moderate complexity  EVALUATION COMPLEXITY: Moderate  PLAN:  PT FREQUENCY: 1-2x/week  PT DURATION: 6 weeks  PLANNED INTERVENTIONS: Therapeutic exercises, Therapeutic activity, Neuromuscular re-education, Balance training, Gait training, Patient/Family education, Self Care, Joint mobilization, Stair training, Vestibular training, Canalith repositioning, DME instructions, Manual therapy, and  Re-evaluation  PLAN FOR NEXT SESSION: Trial various assistive devices to determine safest option and work on dynamic gait with cane/walking stick. Work on balance on compliant surfaces, retro gait, head motions, obstacles, SLS    Drake Leach, PT, DPT 08/09/2022, 2:01 PM

## 2022-08-09 NOTE — Patient Instructions (Addendum)
   Compensation Strategies for Tremors  When eating, try the following: . Eat out of bowls, divided plates, or use a plate guard (available at a medical supply store) and eat with a spoon so that you have an edge to scoop up food. . Try raising your plate so that there is less distance between the plate and mouth. . Try stabilizing elbows on the table or against your body. . Use utensil with built-up/larger grips as they are easier to hold.  When writing, try the following:   . Stabilize forearm on the table . Take your time as rushing/being stressed can increase tremors. . Try a felt-tipped pen, it does not glide as much.  Avoid gel pens (they move too much). . Consider using pre-printed labels with your name and address (carry them with you when you go out) or you can get stamps with your address or signature on it. . Use a small tape recorder to record messages/reminders for yourself. . Use pens with bigger grips.  When brushing your teeth, putting on make-up, or styling hair, try the following: . Use an electric toothbrush. . Use items with built-up grips. . Stabilize your elbows against your body or on the counter. . Use long-handled brushes/combs. . Use a hair dryer with a stand.  In general: . Avoid stress, fatigue, or rushing as this can increase tremors. . Sit down for activities that require more control/coordination.  

## 2022-08-09 NOTE — Therapy (Signed)
OUTPATIENT OCCUPATIONAL THERAPY TREATMENT  Patient Name: April Church MRN: [id number] DOB:1940-03-04, 83 y.o., female Today's Date: 08/09/2022  PCP: Lorenda Ishihara, MD  REFERRING PROVIDER: Vladimir Faster, DO   END OF SESSION:  OT End of Session - 08/09/22 1233     Visit Number 3    Number of Visits 9    Date for OT Re-Evaluation 08/27/22   currently only scheduled until 08/18/2022   Authorization Type Medicare    OT Start Time 1233    Activity Tolerance Patient tolerated treatment well    Behavior During Therapy [facility] for tasks assessed/performed             Past Medical History:  Diagnosis Date   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 2011- no further tx. now- Dr. Donnie Coffin , Dr. Dayton Scrape   Cyst of finger 11/2011   annular cyst right long finger   Dental crowns present    Dermatitis    Frequency of urination    GERD (gastroesophageal reflux disease)    Hemorrhoid    History of colon polyps 2009   Also noted 2012 and 2017 by colonoscopy.   History of shingles 1971   Had right ischial recurrence in the March 2019   Hyperlipidemia    On atorvastatin   Hypertension    under control, has been on med. x 2 yrs.   Liposarcoma of left shoulder (HCC) 1969   Parkinson's disease    With mild tremor (neurologist Dr. Marjory Lies), neurosurgeon Dr. Lovell Sheehan   PONV (postoperative nausea and vomiting)    Seasonal allergies    Shingles of eyelid 1971   Right eyelid; recurrent -> last episode 05/11/2017   Tremors of nervous system    hands-essential tremor; associated with Parkinson's.   Trigger finger of right hand 11/2011   long finger   Past Surgical History:  Procedure Laterality Date   14-day Zio Patch Monitor  08/2020   (report to be scanned): Predominant SR w/ HR 61 to 142 bpm and average 86 bpm.  Total of 59 episodes  of PAT/PSVT  4 to 15 beats (not noted on diary).  Fastest was 5 beats at a rate of 193 bpm, longest was 15 beats at a rate of 106 bpm.  Rare isolated PACs (as well as couplets and triplets) with rare isolated PVCs.  No sustained arrhythmias or bradycardia to explain syncope.   ANTERIOR CERVICAL DECOMPRESSION/DISCECTOMY FUSION 4 LEVELS N/A 11/14/2013   Procedure: ANTERIOR CERVICAL DECOMPRESSION/DISCECTOMY FUSION 4 LEVELS;  Surgeon: Tressie Stalker, MD;  Location: [facility];  Service: Neurosurgery;  Laterality: N/A;  C34 C45 C56 C67 anterior cervical fusion with interbody prosthesis plating and  bonegraft   APPENDECTOMY  age 70   BREAST LUMPECTOMY  06/16/2009   left; SLN bx.   BREAST LUMPECTOMY  07/01/2009   re-excision   BREAST SURGERY  02/22/1997   reduction   CATARACT EXTRACTION, BILATERAL     COLONOSCOPY WITH PROPOFOL N/A 12/03/2014   Procedure: COLONOSCOPY WITH PROPOFOL;  Surgeon: Charolett Bumpers, MD;  Location: [facility];  Service: Endoscopy;  Laterality: N/A;   FOOT SURGERY  06/22/2011   left   KNEE ARTHROSCOPY  03/12/2005   right   KNEE ARTHROSCOPY     left   Lower Extremity Venous Dopplers  12/12/2020   No DVT bilaterally in the  deep veins or superficial veins.  No deep or superficial venous reflux noted bilaterally with exception of Right SSV at the knee.   NASAL SINUS SURGERY     x 2   NEPHRECTOMY LIVING DONOR Left 04/22/2008   donated to spouse 2010([facility])   TRANSTHORACIC ECHOCARDIOGRAM  12/10/2020   EF 60 to 65%.  Normal LV size and function.  No heart WMA.  GR 1 DD.  Normal RV, RVP and RAP.[name]  Normal valves.   TRIGGER FINGER RELEASE  12/16/2011   Procedure: RELEASE TRIGGER FINGER/A-1 PULLEY;  Surgeon: Tami Ribas, MD;  Location: [facility];  Service: Orthopedics;  Laterality: Right;  RIGHT LONG FINGER TRIGGER RELEASE & ANNULAR CYST EXCISION   TUMOR EXCISION  age 36   right arm   Patient Active Problem List   Diagnosis Date Noted   Rectal bleeding  07/27/2022   Hemorrhoids 07/27/2022   Chronic pain of both knees 06/21/2022   Moderate persistent asthma without complication 03/27/2021   PSVT (paroxysmal supraventricular tachycardia) 11/26/2020   Syncope and collapse 11/24/2020   Primary osteoarthritis of right knee 05/12/2020   Primary osteoarthritis of left knee 05/12/2020   Hyperlipidemia    Bilateral calf pain 03/05/2019   Genetic testing 02/13/2019   Family history of ovarian cancer    Family history of bladder cancer    Family history of colon cancer    Family history of leukemia    Lumbar radiculopathy 12/08/2018   Myofascial pain 12/08/2018   Impaired gait and mobility 12/08/2018   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   Chronic nonallergic rhinitis 12/29/2016   Mild persistent asthma, uncomplicated 12/29/2016   Vocal fold paralysis, 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   Gastroesophageal reflux disease 12/29/2016   Cervical spondylosis with myelopathy and radiculopathy 11/14/2013   Essential tremor 06/13/2013   Cervical spondylosis without myelopathy 06/13/2013   Breast cancer of lower-outer quadrant of left female breast (HCC) 09/10/2010    ONSET DATE: 07/22/2022 (Date of referral)  REFERRING DIAG:  R26.81 (ICD-10-CM) - Gait instability  R25.1 (ICD-10-CM) - Tremor  R27.9 (ICD-10-CM) - Incoordination    THERAPY DIAG:  Other lack of coordination  Tremor  Incoordination  Unsteadiness on feet  Other abnormalities of gait and mobility  Abnormal posture  Rationale for Evaluation and Treatment: Rehabilitation  SUBJECTIVE:   SUBJECTIVE STATEMENT: Pt has a lot of bills and checks that she signs. She is embarrassed by her hand writing.   Pt accompanied by: self  PERTINENT HISTORY: Hx of essential tremor and previously diagnosed with Parkinson's; however, she is to undergo further testing to see if it confirms this diagnosis. Pt referred to OP OT following Parkinson's screen due to concerns with UE coordination.    PRECAUTIONS: Fall  WEIGHT BEARING RESTRICTIONS: No  PAIN:  Are you having pain? Yes: NPRS scale: 3/10 Pain location: B knees  FALLS: Has patient fallen in last 6 months? No  LIVING ENVIRONMENT: Lives with: lives with their family and lives alone Lives in: House/apartment Stairs: Yes: Internal: 12-13 steps; can reach both and External: 3 steps; can reach both; she is able to live on main floor Has following equipment at home: Single point cane, Walker - 4 wheeled, shower chair, and Grab bars  PLOF: Independent; driving; retired Therapist, nutritional and Research officer, political party; reading; watching tv; visiting friends  PATIENT GOALS: to stay as independent as possible for as long as possible  OBJECTIVE:   HAND DOMINANCE: Right  ADLs: Overall ADLs: mod I  IADLs: Light housekeeping: hired help every  2 weeks  Handwriting: 75% legible and worse at end of day  MOBILITY STATUS: Independent and uses cane occassionally  POSTURE COMMENTS:  rounded shoulders  ACTIVITY TOLERANCE: Activity tolerance: good  FUNCTIONAL OUTCOME MEASURES: Fastening/unfastening 3 buttons: 36 Physical performance test: PPT#2 (simulated eating) 12 seconds & PPT#4 (donning/doffing jacket): 16  COORDINATION: 9 Hole Peg test: Right: 44 sec; Left: 32 sec  UE ROM:  WFL  UE MMT:   [facility]  08/02/2022 - Grip: R - 26.6 lbs; L - 27.1 lbs  SENSATION: WFL  MUSCLE TONE: RUE: Mild and Rigidity and LUE: Mild and Rigidity  COGNITION: Overall cognitive status: Within functional limits for tasks assessed  OBSERVATIONS: Dyskinesias and essential tremor   TODAY'S TREATMENT:                                                                                                                               OT initiated compensatory strategies for tremor as noted in pt instructions. Pt writing sentence with varius writing utensils with preference for foam tubing.  OT educated pt on use of large handled cup/mug with lid to reduce risk of  spills. She was encouraged to keep the cup size 20 oz or under.   PATIENT EDUCATION: Education details: Tremor compensatory strategies Person educated: Patient Education method: Explanation, Demonstration, and Handouts Education comprehension: verbalized understanding and needs further education  HOME EXERCISE PROGRAM: 08/02/2022: [facility]! Hands and prior HEPs 08/09/2022: Tremor compensatory strategies  GOALS:  LONG TERM GOALS: Target date: 08/27/2022    Pt will be independent with HEP.  Baseline: not yet initiated Goal status: In Progress  2.  Pt will verbalize understanding of adapted strategies to maximize safety and independence with ADLs/IADLs.  Baseline: not yet initiated Goal status: INITIAL  3.  Pt will demonstrate improved fine motor coordination for ADLs as evidenced by decreasing 9 hole peg test score for R hand by at least 10 secs  Baseline: 44 secs Goal status: INITIAL  5.  Pt will demonstrate improved ease with fastening buttons as evidenced by decreasing 3 button/unbutton time by at least 4 seconds  Baseline: 36 seconds Goal status: INITIAL  6.  Pt will write a short paragraph with no significant decrease in size and maintain 100% legibility.  Baseline: 75% legibility Goal status: INITIAL  7. Pt will demonstrate increased ease with dressing as evidenced by decreasing PPT#4 (don/ doff jacket) to 13 secs or less.  Baseline: 16 secs Goal status: INITIAL    ASSESSMENT:  CLINICAL IMPRESSION: Pt remains limited by carry-over. Would recommend further strategies for writing as this is most important to her.   PERFORMANCE DEFICITS: in functional skills including ADLs, IADLs, coordination, strength, Fine motor control, mobility, and UE functional use  IMPAIRMENTS: are limiting patient from ADLs, IADLs, and leisure.   COMORBIDITIES:  may have co-morbidities  that affects occupational performance. Patient will benefit from skilled OT to address above impairments  and improve overall function.  REHAB  POTENTIAL: Good  PLAN:  OT FREQUENCY: 1-2x/week  OT DURATION: 4 weeks  PLANNED INTERVENTIONS: self care/ADL training, therapeutic exercise, therapeutic activity, functional mobility training, patient/family education, coping strategies training, DME and/or AE instructions, and Re-evaluation  RECOMMENDED OTHER SERVICES: none at this time  CONSULTED AND AGREED WITH PLAN OF CARE: Patient  PLAN FOR NEXT SESSION: WRITING strategies (prefers foam tubing); Finish reviewing previous UE HEP focusing on tremor management/coordination (pt to bring in folder of HEP)   Delana Meyer, OT 08/09/2022, 12:34 PM

## 2022-08-11 ENCOUNTER — Encounter: Payer: Self-pay | Admitting: Physical Therapy

## 2022-08-11 ENCOUNTER — Ambulatory Visit: Payer: Medicare Other | Admitting: Occupational Therapy

## 2022-08-11 ENCOUNTER — Encounter: Payer: Self-pay | Admitting: Occupational Therapy

## 2022-08-11 ENCOUNTER — Ambulatory Visit: Payer: Medicare Other | Admitting: Physical Therapy

## 2022-08-11 DIAGNOSIS — R251 Tremor, unspecified: Secondary | ICD-10-CM | POA: Diagnosis not present

## 2022-08-11 DIAGNOSIS — R2681 Unsteadiness on feet: Secondary | ICD-10-CM

## 2022-08-11 DIAGNOSIS — R278 Other lack of coordination: Secondary | ICD-10-CM

## 2022-08-11 DIAGNOSIS — R279 Unspecified lack of coordination: Secondary | ICD-10-CM | POA: Diagnosis not present

## 2022-08-11 DIAGNOSIS — R2689 Other abnormalities of gait and mobility: Secondary | ICD-10-CM | POA: Diagnosis not present

## 2022-08-11 DIAGNOSIS — R293 Abnormal posture: Secondary | ICD-10-CM

## 2022-08-11 NOTE — Therapy (Signed)
OUTPATIENT PHYSICAL THERAPY NEURO TREATMENT   Patient Name: April Church MRN: [id number] DOB:10-Mar-1940, 82 y.o., female Today's Date: 08/11/2022   PCP: Lorenda Ishihara, MD REFERRING PROVIDER: Vladimir Faster, DO  END OF SESSION:  PT End of Session - 08/11/22 0933     Visit Number 4    Number of Visits 13   Plus eval   Date for PT Re-Evaluation 09/09/22    Authorization Type Medicare A & B    PT Start Time 0931    PT Stop Time 1012    PT Time Calculation (min) 41 min    Equipment Utilized During Treatment Gait belt    Activity Tolerance Patient tolerated treatment well    Behavior During Therapy WFL for tasks assessed/performed             Past Medical History:  Diagnosis Date   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 2011- no further tx. now- Dr. Donnie Coffin , Dr. Dayton Scrape   Cyst of finger 11/2011   annular cyst right long finger   Dental crowns present    Dermatitis    Frequency of urination    GERD (gastroesophageal reflux disease)    Hemorrhoid    History of colon polyps 2009   Also noted 2012 and 2017 by colonoscopy.   History of shingles 1971   Had right ischial recurrence in the March 2019   Hyperlipidemia    On atorvastatin   Hypertension    under control, has been on med. x 2 yrs.   Liposarcoma of left shoulder (HCC) 1969   Parkinson's disease    With mild tremor (neurologist Dr. Marjory Lies), neurosurgeon Dr. Lovell Sheehan   PONV (postoperative nausea and vomiting)    Seasonal allergies    Shingles of eyelid 1971   Right eyelid; recurrent -> last episode 05/11/2017   Tremors of nervous system    hands-essential tremor; associated with Parkinson's.   Trigger finger of right hand 11/2011   long finger   Past Surgical History:  Procedure Laterality Date   14-day Zio Patch Monitor  08/2020   (report to  be scanned): Predominant SR w/ HR 61 to 142 bpm and average 86 bpm.  Total of 59 episodes of PAT/PSVT  4 to 15 beats (not noted on diary).  Fastest was 5 beats at a rate of 193 bpm, longest was 15 beats at a rate of 106 bpm.  Rare isolated PACs (as well as couplets and triplets) with rare isolated PVCs.  No sustained arrhythmias or bradycardia to explain syncope.   ANTERIOR CERVICAL DECOMPRESSION/DISCECTOMY FUSION 4 LEVELS N/A 11/14/2013   Procedure: ANTERIOR CERVICAL DECOMPRESSION/DISCECTOMY FUSION 4 LEVELS;  Surgeon: Tressie Stalker, MD;  Location: [facility];  Service: Neurosurgery;  Laterality: N/A;  C34 C45 C56 C67 anterior cervical fusion with interbody prosthesis plating and  bonegraft   APPENDECTOMY  age 55   BREAST LUMPECTOMY  06/16/2009   left; SLN bx.   BREAST LUMPECTOMY  07/01/2009   re-excision   BREAST SURGERY  02/22/1997   reduction   CATARACT EXTRACTION, BILATERAL     COLONOSCOPY WITH PROPOFOL N/A 12/03/2014   Procedure: COLONOSCOPY WITH PROPOFOL;  Surgeon: Charolett Bumpers, MD;  Location: [facility];  Service: Endoscopy;  Laterality: N/A;   FOOT SURGERY  06/22/2011   left   KNEE ARTHROSCOPY  03/12/2005  right   KNEE ARTHROSCOPY     left   Lower Extremity Venous Dopplers  12/12/2020   No DVT bilaterally in the deep veins or superficial veins.  No deep or superficial venous reflux noted bilaterally with exception of Right SSV at the knee.   NASAL SINUS SURGERY     x 2   NEPHRECTOMY LIVING DONOR Left 04/22/2008   donated to spouse 2010([facility])   TRANSTHORACIC ECHOCARDIOGRAM  12/10/2020   EF 60 to 65%.  Normal LV size and function.  No heart WMA.  GR 1 DD.  Normal RV, RVP and RAP.[name]  Normal valves.   TRIGGER FINGER RELEASE  12/16/2011   Procedure: RELEASE TRIGGER FINGER/A-1 PULLEY;  Surgeon: Tami Ribas, MD;  Location: [facility];  Service: Orthopedics;  Laterality: Right;  RIGHT LONG FINGER TRIGGER RELEASE & ANNULAR CYST EXCISION   TUMOR EXCISION  age  40   right arm   Patient Active Problem List   Diagnosis Date Noted   Rectal bleeding 07/27/2022   Hemorrhoids 07/27/2022   Chronic pain of both knees 06/21/2022   Moderate persistent asthma without complication 03/27/2021   PSVT (paroxysmal supraventricular tachycardia) 11/26/2020   Syncope and collapse 11/24/2020   Primary osteoarthritis of right knee 05/12/2020   Primary osteoarthritis of left knee 05/12/2020   Hyperlipidemia    Bilateral calf pain 03/05/2019   Genetic testing 02/13/2019   Family history of ovarian cancer    Family history of bladder cancer    Family history of colon cancer    Family history of leukemia    Lumbar radiculopathy 12/08/2018   Myofascial pain 12/08/2018   Impaired gait and mobility 12/08/2018   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   Chronic nonallergic rhinitis 12/29/2016   Mild persistent asthma, uncomplicated 12/29/2016   Vocal fold paralysis, 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   Gastroesophageal reflux disease 12/29/2016   Cervical spondylosis with myelopathy and radiculopathy 11/14/2013   Essential tremor 06/13/2013   Cervical spondylosis without myelopathy 06/13/2013   Breast cancer of lower-outer quadrant of left female breast (HCC) 09/10/2010    ONSET DATE: 07/22/2022 (referral)  REFERRING DIAG: R26.81 (ICD-10-CM) - Gait instability R25.1 (ICD-10-CM) - Tremor R27.9 (ICD-10-CM) - Incoordination  THERAPY DIAG:  Other lack of coordination  Unsteadiness on feet  Other abnormalities of gait and mobility  Abnormal posture  Rationale for Evaluation and Treatment: Rehabilitation  SUBJECTIVE:                                                                                                                                                                                             SUBJECTIVE STATEMENT: Nothing new, no  falls. Has been using her cane more.      Pt accompanied by: self  PERTINENT HISTORY: Acquired solitary kidney (04/2008), Asthma,  Breast cancer (HCC) (05/2009), Cyst of finger (11/2011), Dental crowns present, Dermatitis, Frequency of urination, GERD (gastroesophageal reflux disease), Hemorrhoid, History of colon polyps (2009), History of shingles (1971), Hyperlipidemia, Hypertension, Liposarcoma of left shoulder (HCC) (1969), Parkinson's disease, PONV (postoperative nausea and vomiting), Seasonal allergies, Shingles of eyelid (1971), Tremors of nervous system, and Trigger finger of right hand (11/2011). here with essential tremor since 1990's. Now with intermittent resting tremor AND bradykinesia since 2016. May represent essential tremor with superimposed parkinson's disease + cervical myelopathy sequelae  PAIN:  Are you having pain? Yes, pt reports some knee pain   PRECAUTIONS: Fall  WEIGHT BEARING RESTRICTIONS: No  FALLS: Has patient fallen in last 6 months? No  LIVING ENVIRONMENT: Lives with: lives alone Lives in: House/apartment Stairs: Yes: Internal: full flight steps; bilateral but cannot reach both and External: 3 steps; on right going up, on left going up, and can reach both Has following equipment at home: Single point cane and Walker - 4 wheeled  PLOF: Independent  PATIENT GOALS: "Being able to control my balance better"  OBJECTIVE:   COGNITION: Overall cognitive status: Within functional limits for tasks assessed   SENSATION: Denies numbness/tingling in BUE/BLEs  COORDINATION: Heel to shin test: WNL bilaterally    POSTURE: rounded shoulders, forward head, and increased thoracic kyphosis  LOWER EXTREMITY ROM:     Active  Right Eval Left Eval  Hip flexion    Hip extension    Hip abduction    Hip adduction    Hip internal rotation    Hip external rotation    Knee flexion    Knee extension    Ankle dorsiflexion    Ankle plantarflexion    Ankle inversion    Ankle eversion     (Blank rows = not tested)  LOWER EXTREMITY MMT:  Tested in seated position   MMT Right Eval Left Eval   Hip flexion 4- 4  Hip extension    Hip abduction 4 4  Hip adduction 4 4  Hip internal rotation    Hip external rotation    Knee flexion 4- 4  Knee extension 4- 4  Ankle dorsiflexion 4 4  Ankle plantarflexion    Ankle inversion    Ankle eversion    (Blank rows = not tested)  BED MOBILITY:  Independent per pt   TRANSFERS: Assistive device utilized: None  Sit to stand: Modified independence Stand to sit: Modified independence   GAIT: Gait pattern:  lateral gait deviations, step through pattern, decreased arm swing- Right, decreased arm swing- Left, decreased stride length, knee flexed in stance- Right, knee flexed in stance- Left, lateral hip instability, trunk flexed, and narrow BOS Distance walked: Various clinic distances  Assistive device utilized: None Level of assistance: CGA and Min A Comments: Pt demonstrates increased instability to L side > R side and frequently relied on furniture walking to stabilize.   TODAY'S TREATMENT:             GAIT: Gait pattern: lateral gait deviations, step through pattern, decreased arm swing- Right, decreased arm swing- Left, decreased stride length, knee flexed in stance- Right, knee flexed in stance- Left, lateral hip instability, trunk flexed, and narrow BOS Distance walked: Various clinic distances  Assistive device utilized: [facility] Comments: Intermittent cues for sequencing and posture. Pt reporting that she has been using her cane more.  Performed 115' with gait with head turns and looking up    Therapeutic Exercise: SciFit with BUE/BLE Multi-Peaks at Gear 3.0 for 8 minutes for reciprocal movement patterns, neural priming, activity tolerance. Pt reporting RPE at 5-6/10   NMR:  With 6 blaze pods on mirror  Reaching with ipsilateral hand: 3 bouts of 1 minute: 11 hits, 16 hits, 18 hits  15 hits, 18 hits    On blue air ex: Alternating forward stepping, then adding in head turns R/L and adding in when turning head to name  card Alternating SLS taps to 2 cones x12 reps each side with no UE support                      Therapeutic Activity:  STAIRS:  Level of Assistance: SBA  Stair Negotiation Technique: Step to Pattern Alternating Pattern  Forwards with Single Rail on Right  Number of Stairs: 4 X 4 reps = 16 steps total   Height of Stairs: 6  Comments: Pt ascending with alternating pattern and descending with step to pattern      PATIENT EDUCATION: Education details: Where to purchase walking stick, pt can have a cane in the house and keep either cane/walking stick in the car so she always makes sure that she has an AD with her, use of AD at all times for safety/decr fall risk. Had discussion with pt that it is hard for her to bring the rollator out in the community as she has a very tough time trying to fold it up in her car to take it with her.  Person educated: Patient Education method: Explanation, Demonstration, and Handouts Education comprehension: verbalized understanding, returned demonstration, and needs further education  HOME EXERCISE PROGRAM: Access Code: [email or web address] URL: [email or web address] Date: 08/02/2022 Prepared by: Sherlie Ban  Exercises - Seated Hamstring Stretch  - 1 x daily - 5 x weekly - 1 sets - 3 reps - 30 sec hold - Sit to Stand Without Arm Support  - 1 x daily - 7 x weekly - 3 sets - 10 reps - Standing Balance with Eyes Closed on Foam  - 1 x daily - 5 x weekly - 3 sets - 30 hold - Romberg Stance on Foam Pad with Head Rotation  - 1 x daily - 5 x weekly - 2 sets - 10 reps - Walking with Head Rotation  - 1 x daily - 5 x weekly - 3 sets - Side to Side Weight Shift with Overhead Reach and Counter Support  - 1 x daily - 5 x weekly - 1 sets - 10 reps   GOALS: Goals reviewed with patient? Yes  SHORT TERM GOALS: Target date: 08/26/2022   Pt will be independent with initial HEP for improved strength, balance, transfers and gait.  Baseline: to be reviewed from  previous bout of therapy Goal status: INITIAL  2.  Pt will improve FGA to at least a 17/30 in order to demo decr fall risk. Baseline: 14/30 Goal status: INITIAL  3.  Pt will improve condition 4 of mCTSIB to at least 8 seconds in order to demo improved vestibular input for balance.  Baseline: 3-4 seconds  Goal status: INITIAL  4.  Pt will trial various assistive devices in clinic to determine safest option for home use for fall prevention  Baseline:  Goal status: INITIAL  5.  Pt will improve gait velocity to at least 2.9 ft/s w/LRAD and SBA for improved gait efficiency and reduced fall  risk   Baseline: 2.6 ft/s no AD Goal status: INITIAL  6.  Pt will teach back and demonstrate fall prevention techniques to implement at home and in community for reduced fall risk  Baseline:  Goal status: INITIAL  LONG TERM GOALS: Target date: 09/09/2022   Pt will be independent with final HEP for improved strength, balance, transfers and gait.  Baseline:  Goal status: INITIAL  2.  Pt will improve condition 4 of mCTSIB to at least 12 seconds in order to demo improved vestibular input for balance. Baseline: 3-4 seconds  Goal status: INITIAL  3.  Pt will improve FGA to at least a 20/30 in order to demo decr fall risk. Baseline: 14/30 Goal status: INITIAL  4.  Pt will improve gait velocity to at least 3.1 ft/s w/LRAD mod I for improved gait efficiency and independence  Baseline: 2.6 ft/s no AD Goal status: INITIAL  5.  Pt will improve 5 x STS to less than or equal to 14 w/o UE support seconds to demonstrate improved functional strength and transfer efficiency.   Baseline: 18.5s no UE support Goal status: INITIAL   ASSESSMENT:  CLINICAL IMPRESSION: Today's skilled session focused on most appropriate AD for pt to use in her house/the community. Pt has a rollator but does not like to use it and also reports difficulty with getting in and out of the car for the community. Pt brought in [facility]  from home with therapist going over sequencing with pt and pt doing better having cane in RUE. Also trialed single walking stick with pt also reporting steady with this and could use for outdoor distances. Showed pt where to purchase. Pt needing cues during gait for posture and looking ahead and educated on use of AD at all times for safety. Will continue per POC.    OBJECTIVE IMPAIRMENTS: Abnormal gait, decreased activity tolerance, decreased balance, decreased coordination, decreased endurance, decreased knowledge of use of DME, decreased mobility, difficulty walking, decreased ROM, decreased strength, improper body mechanics, postural dysfunction, and pain  ACTIVITY LIMITATIONS: carrying, lifting, bending, squatting, stairs, transfers, and locomotion level  PARTICIPATION LIMITATIONS: shopping, community activity, yard work, and church  PERSONAL FACTORS: Age, Fitness, and 1 comorbidity: essential tremor  are also affecting patient's functional outcome.   REHAB POTENTIAL: Good  CLINICAL DECISION MAKING: Evolving/moderate complexity  EVALUATION COMPLEXITY: Moderate  PLAN:  PT FREQUENCY: 1-2x/week  PT DURATION: 6 weeks  PLANNED INTERVENTIONS: Therapeutic exercises, Therapeutic activity, Neuromuscular re-education, Balance training, Gait training, Patient/Family education, Self Care, Joint mobilization, Stair training, Vestibular training, Canalith repositioning, DME instructions, Manual therapy, and Re-evaluation  PLAN FOR NEXT SESSION: Trial various assistive devices to determine safest option and work on dynamic gait with cane/walking stick. Work on balance on compliant surfaces, retro gait, head motions, obstacles, SLS    Drake Leach, PT, DPT 08/11/2022, 10:15 AM

## 2022-08-11 NOTE — Therapy (Signed)
OUTPATIENT OCCUPATIONAL THERAPY TREATMENT  Patient Name: April Church MRN: [id number] DOB:18-Feb-1941, 82 y.o., female Today's Date: 08/11/2022  PCP: Lorenda Ishihara, MD  REFERRING PROVIDER: Vladimir Faster, DO   END OF SESSION:  OT End of Session - 08/11/22 1015     Visit Number 4    Number of Visits 9    Date for OT Re-Evaluation 08/27/22   currently only scheduled until 08/18/2022   Authorization Type Medicare    OT Start Time 1015    OT Stop Time 1055    OT Time Calculation (min) 40 min    Activity Tolerance Patient tolerated treatment well    Behavior During Therapy [facility] for tasks assessed/performed             Past Medical History:  Diagnosis Date   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 2011- no further tx. now- Dr. Donnie Coffin , Dr. Dayton Scrape   Cyst of finger 11/2011   annular cyst right long finger   Dental crowns present    Dermatitis    Frequency of urination    GERD (gastroesophageal reflux disease)    Hemorrhoid    History of colon polyps 2009   Also noted 2012 and 2017 by colonoscopy.   History of shingles 1971   Had right ischial recurrence in the March 2019   Hyperlipidemia    On atorvastatin   Hypertension    under control, has been on med. x 2 yrs.   Liposarcoma of left shoulder (HCC) 1969   Parkinson's disease    With mild tremor (neurologist Dr. Marjory Lies), neurosurgeon Dr. Lovell Sheehan   PONV (postoperative nausea and vomiting)    Seasonal allergies    Shingles of eyelid 1971   Right eyelid; recurrent -> last episode 05/11/2017   Tremors of nervous system    hands-essential tremor; associated with Parkinson's.   Trigger finger of right hand 11/2011   long finger   Past Surgical History:  Procedure Laterality Date   14-day Zio Patch Monitor  08/2020   (report to be scanned): Predominant SR w/  HR 61 to 142 bpm and average 86 bpm.  Total of 59 episodes of PAT/PSVT  4 to 15 beats (not noted on diary).  Fastest was 5 beats at a rate of 193 bpm, longest was 15 beats at a rate of 106 bpm.  Rare isolated PACs (as well as couplets and triplets) with rare isolated PVCs.  No sustained arrhythmias or bradycardia to explain syncope.   ANTERIOR CERVICAL DECOMPRESSION/DISCECTOMY FUSION 4 LEVELS N/A 11/14/2013   Procedure: ANTERIOR CERVICAL DECOMPRESSION/DISCECTOMY FUSION 4 LEVELS;  Surgeon: Tressie Stalker, MD;  Location: [facility];  Service: Neurosurgery;  Laterality: N/A;  C34 C45 C56 C67 anterior cervical fusion with interbody prosthesis plating and  bonegraft   APPENDECTOMY  age 83   BREAST LUMPECTOMY  06/16/2009   left; SLN bx.   BREAST LUMPECTOMY  07/01/2009   re-excision   BREAST SURGERY  02/22/1997   reduction   CATARACT EXTRACTION, BILATERAL     COLONOSCOPY WITH PROPOFOL N/A 12/03/2014   Procedure: COLONOSCOPY WITH PROPOFOL;  Surgeon: Charolett Bumpers, MD;  Location: [facility];  Service: Endoscopy;  Laterality: N/A;   FOOT SURGERY  06/22/2011   left   KNEE ARTHROSCOPY  03/12/2005   right   KNEE ARTHROSCOPY  left   Lower Extremity Venous Dopplers  12/12/2020   No DVT bilaterally in the deep veins or superficial veins.  No deep or superficial venous reflux noted bilaterally with exception of Right SSV at the knee.   NASAL SINUS SURGERY     x 2   NEPHRECTOMY LIVING DONOR Left 04/22/2008   donated to spouse 2010([facility])   TRANSTHORACIC ECHOCARDIOGRAM  12/10/2020   EF 60 to 65%.  Normal LV size and function.  No heart WMA.  GR 1 DD.  Normal RV, RVP and RAP.[name]  Normal valves.   TRIGGER FINGER RELEASE  12/16/2011   Procedure: RELEASE TRIGGER FINGER/A-1 PULLEY;  Surgeon: Tami Ribas, MD;  Location: [facility];  Service: Orthopedics;  Laterality: Right;  RIGHT LONG FINGER TRIGGER RELEASE & ANNULAR CYST EXCISION   TUMOR EXCISION  age 48   right arm   Patient  Active Problem List   Diagnosis Date Noted   Rectal bleeding 07/27/2022   Hemorrhoids 07/27/2022   Chronic pain of both knees 06/21/2022   Moderate persistent asthma without complication 03/27/2021   PSVT (paroxysmal supraventricular tachycardia) 11/26/2020   Syncope and collapse 11/24/2020   Primary osteoarthritis of right knee 05/12/2020   Primary osteoarthritis of left knee 05/12/2020   Hyperlipidemia    Bilateral calf pain 03/05/2019   Genetic testing 02/13/2019   Family history of ovarian cancer    Family history of bladder cancer    Family history of colon cancer    Family history of leukemia    Lumbar radiculopathy 12/08/2018   Myofascial pain 12/08/2018   Impaired gait and mobility 12/08/2018   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   Chronic nonallergic rhinitis 12/29/2016   Mild persistent asthma, uncomplicated 12/29/2016   Vocal fold paralysis, 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   Gastroesophageal reflux disease 12/29/2016   Cervical spondylosis with myelopathy and radiculopathy 11/14/2013   Essential tremor 06/13/2013   Cervical spondylosis without myelopathy 06/13/2013   Breast cancer of lower-outer quadrant of left female breast (HCC) 09/10/2010    ONSET DATE: 07/22/2022 (Date of referral)  REFERRING DIAG:  R26.81 (ICD-10-CM) - Gait instability  R25.1 (ICD-10-CM) - Tremor  R27.9 (ICD-10-CM) - Incoordination    THERAPY DIAG:  Other lack of coordination  Tremor  Unsteadiness on feet  Rationale for Evaluation and Treatment: Rehabilitation  SUBJECTIVE:   SUBJECTIVE STATEMENT: My handwriting gets bigger instead of smaller but the tremors are worse at times than others. I see Dr. Arbutus Leas soon for a Dat scan  Pt accompanied by: self  PERTINENT HISTORY: Hx of essential tremor and previously diagnosed with Parkinson's; however, she is to undergo further testing to see if it confirms this diagnosis. Pt referred to OP OT following Parkinson's screen due to concerns with UE  coordination.   PRECAUTIONS: Fall  WEIGHT BEARING RESTRICTIONS: No  PAIN:  Are you having pain? Yes: NPRS scale: 3/10 Pain location: B knees  FALLS: Has patient fallen in last 6 months? No  LIVING ENVIRONMENT: Lives with: lives with their family and lives alone Lives in: House/apartment Stairs: Yes: Internal: 12-13 steps; can reach both and External: 3 steps; can reach both; she is able to live on main floor Has following equipment at home: Single point cane, Walker - 4 wheeled, shower chair, and Grab bars  PLOF: Independent; driving; retired Therapist, nutritional and Research officer, political party; reading; watching tv; visiting friends  PATIENT GOALS: to stay as independent as possible for as long as possible  OBJECTIVE:   HAND DOMINANCE: Right  ADLs: Overall  ADLs: mod I  IADLs: Light housekeeping: hired help every 2 weeks  Handwriting: 75% legible and worse at end of day  MOBILITY STATUS: Independent and uses cane occassionally  POSTURE COMMENTS:  rounded shoulders  ACTIVITY TOLERANCE: Activity tolerance: good  FUNCTIONAL OUTCOME MEASURES: Fastening/unfastening 3 buttons: 36 Physical performance test: PPT#2 (simulated eating) 12 seconds & PPT#4 (donning/doffing jacket): 16  COORDINATION: 9 Hole Peg test: Right: 44 sec; Left: 32 sec  UE ROM:  WFL  UE MMT:   [facility]  08/02/2022 - Grip: R - 26.6 lbs; L - 27.1 lbs  SENSATION: WFL  MUSCLE TONE: RUE: Mild and Rigidity and LUE: Mild and Rigidity  COGNITION: Overall cognitive status: Within functional limits for tasks assessed  OBSERVATIONS: Dyskinesias and essential tremor   TODAY'S TREATMENT:                                                                                                                               Reviewed and reinforced strategies for tremors with eating and writing. Discussed strategies and possible A/E for eating including: elbow supported on table, plate slightly elevated, shallow bowl instead of plate, angling  spoon towards self prior to scooping, and Rt angled larger handled utensils. Possible wrist weight 1-2 lbs ONLY if needed, and ONLY for eating.   Practiced writing w/ strategies (issued writing strategies from [employer]) and foam on pen and forearm supported. Also discussed options - pre-made labels, signature stamp, filling out check beforehand to avoid rushing, etc   PATIENT EDUCATION: Education details: Tremor compensatory strategies, writing strategies Person educated: Patient Education method: Explanation, Demonstration, and Handouts Education comprehension: verbalized understanding and needs further education  HOME EXERCISE PROGRAM: 08/02/2022: PWR! Hands and prior HEPs 08/09/2022: Tremor compensatory strategies 08/11/22: writing strategies  GOALS:  LONG TERM GOALS: Target date: 08/27/2022    Pt will be independent with HEP.  Baseline: not yet initiated Goal status: In Progress  2.  Pt will verbalize understanding of adapted strategies to maximize safety and independence with ADLs/IADLs.  Baseline: not yet initiated Goal status: INITIAL  3.  Pt will demonstrate improved fine motor coordination for ADLs as evidenced by decreasing 9 hole peg test score for R hand by at least 10 secs  Baseline: 44 secs Goal status: INITIAL  5.  Pt will demonstrate improved ease with fastening buttons as evidenced by decreasing 3 button/unbutton time by at least 4 seconds  Baseline: 36 seconds Goal status: INITIAL  6.  Pt will write a short paragraph with no significant decrease in size and maintain 100% legibility.  Baseline: 75% legibility Goal status: INITIAL  7. Pt will demonstrate increased ease with dressing as evidenced by decreasing PPT#4 (don/ doff jacket) to 13 secs or less.  Baseline: 16 secs Goal status: INITIAL    ASSESSMENT:  CLINICAL IMPRESSION: Pt receptive to strategies and responds well to suggestions for eating and writing.   PERFORMANCE DEFICITS: in  functional skills including ADLs, IADLs,  coordination, strength, Fine motor control, mobility, and UE functional use  IMPAIRMENTS: are limiting patient from ADLs, IADLs, and leisure.   COMORBIDITIES:  may have co-morbidities  that affects occupational performance. Patient will benefit from skilled OT to address above impairments and improve overall function.  REHAB POTENTIAL: Good  PLAN:  OT FREQUENCY: 1-2x/week  OT DURATION: 4 weeks  PLANNED INTERVENTIONS: self care/ADL training, therapeutic exercise, therapeutic activity, functional mobility training, patient/family education, coping strategies training, DME and/or AE instructions, and Re-evaluation  RECOMMENDED OTHER SERVICES: none at this time  CONSULTED AND AGREED WITH PLAN OF CARE: Patient  PLAN FOR NEXT SESSION: practice buttons, jacket, coordination HEP, posture   Sheran Lawless, OT 08/11/2022, 12:12 PM

## 2022-08-16 ENCOUNTER — Encounter: Payer: Self-pay | Admitting: Occupational Therapy

## 2022-08-16 ENCOUNTER — Encounter: Payer: Self-pay | Admitting: Physical Therapy

## 2022-08-16 ENCOUNTER — Ambulatory Visit: Payer: Medicare Other | Admitting: Physical Therapy

## 2022-08-16 ENCOUNTER — Ambulatory Visit: Payer: Medicare Other | Admitting: Occupational Therapy

## 2022-08-16 DIAGNOSIS — R251 Tremor, unspecified: Secondary | ICD-10-CM | POA: Diagnosis not present

## 2022-08-16 DIAGNOSIS — R293 Abnormal posture: Secondary | ICD-10-CM | POA: Diagnosis not present

## 2022-08-16 DIAGNOSIS — R2689 Other abnormalities of gait and mobility: Secondary | ICD-10-CM

## 2022-08-16 DIAGNOSIS — R279 Unspecified lack of coordination: Secondary | ICD-10-CM | POA: Diagnosis not present

## 2022-08-16 DIAGNOSIS — R2681 Unsteadiness on feet: Secondary | ICD-10-CM

## 2022-08-16 DIAGNOSIS — R278 Other lack of coordination: Secondary | ICD-10-CM

## 2022-08-16 NOTE — Patient Instructions (Addendum)
Coordination Exercises  Perform the following exercises for 10-15 minutes 1 times per day. Perform with both hand(s). Perform using big movements.  Flipping Cards: Place deck of cards on the table. Flip cards over by opening your hand big to grasp and then turn your palm up big.  Deal cards: Hold 1/2 deck in your hand. Use thumb to push card off top of deck with one big push.  Rotate ball with fingertips: Pick up with fingers/thumb and move as much as you can with each turn/movement (clockwise and counter-clockwise).  Pick up coins and stack one at a time: Pick up with big, intentional movements. Do not drag coin to the edge. (5-10 in a stack)  Pick up 5 coins one at a time and hold in palm. Then, move coins from palm to fingertips one at time and place in coin bank/container.  Practice writing Rt hand: Slow down, write big, and focus on forming each letter.

## 2022-08-16 NOTE — Therapy (Signed)
OUTPATIENT OCCUPATIONAL THERAPY TREATMENT  Patient Name: April Church MRN: [id number] DOB:18-Feb-1941, 82 y.o., female Today's Date: 08/11/2022  PCP: Lorenda Ishihara, MD  REFERRING PROVIDER: Vladimir Faster, DO   END OF SESSION:  OT End of Session - 08/11/22 1015     Visit Number 4    Number of Visits 9    Date for OT Re-Evaluation 08/27/22   currently only scheduled until 08/18/2022   Authorization Type Medicare    OT Start Time 1015    OT Stop Time 1055    OT Time Calculation (min) 40 min    Activity Tolerance Patient tolerated treatment well    Behavior During Therapy [facility] for tasks assessed/performed             Past Medical History:  Diagnosis Date   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 2011- no further tx. now- Dr. Donnie Coffin , Dr. Dayton Scrape   Cyst of finger 11/2011   annular cyst right long finger   Dental crowns present    Dermatitis    Frequency of urination    GERD (gastroesophageal reflux disease)    Hemorrhoid    History of colon polyps 2009   Also noted 2012 and 2017 by colonoscopy.   History of shingles 1971   Had right ischial recurrence in the March 2019   Hyperlipidemia    On atorvastatin   Hypertension    under control, has been on med. x 2 yrs.   Liposarcoma of left shoulder (HCC) 1969   Parkinson's disease    With mild tremor (neurologist Dr. Marjory Lies), neurosurgeon Dr. Lovell Sheehan   PONV (postoperative nausea and vomiting)    Seasonal allergies    Shingles of eyelid 1971   Right eyelid; recurrent -> last episode 05/11/2017   Tremors of nervous system    hands-essential tremor; associated with Parkinson's.   Trigger finger of right hand 11/2011   long finger   Past Surgical History:  Procedure Laterality Date   14-day Zio Patch Monitor  08/2020   (report to be scanned): Predominant SR w/  HR 61 to 142 bpm and average 86 bpm.  Total of 59 episodes of PAT/PSVT  4 to 15 beats (not noted on diary).  Fastest was 5 beats at a rate of 193 bpm, longest was 15 beats at a rate of 106 bpm.  Rare isolated PACs (as well as couplets and triplets) with rare isolated PVCs.  No sustained arrhythmias or bradycardia to explain syncope.   ANTERIOR CERVICAL DECOMPRESSION/DISCECTOMY FUSION 4 LEVELS N/A 11/14/2013   Procedure: ANTERIOR CERVICAL DECOMPRESSION/DISCECTOMY FUSION 4 LEVELS;  Surgeon: Tressie Stalker, MD;  Location: [facility];  Service: Neurosurgery;  Laterality: N/A;  C34 C45 C56 C67 anterior cervical fusion with interbody prosthesis plating and  bonegraft   APPENDECTOMY  age 83   BREAST LUMPECTOMY  06/16/2009   left; SLN bx.   BREAST LUMPECTOMY  07/01/2009   re-excision   BREAST SURGERY  02/22/1997   reduction   CATARACT EXTRACTION, BILATERAL     COLONOSCOPY WITH PROPOFOL N/A 12/03/2014   Procedure: COLONOSCOPY WITH PROPOFOL;  Surgeon: Charolett Bumpers, MD;  Location: [facility];  Service: Endoscopy;  Laterality: N/A;   FOOT SURGERY  06/22/2011   left   KNEE ARTHROSCOPY  03/12/2005   right   KNEE ARTHROSCOPY  left   Lower Extremity Venous Dopplers  12/12/2020   No DVT bilaterally in the deep veins or superficial veins.  No deep or superficial venous reflux noted bilaterally with exception of Right SSV at the knee.   NASAL SINUS SURGERY     x 2   NEPHRECTOMY LIVING DONOR Left 04/22/2008   donated to spouse 2010([facility])   TRANSTHORACIC ECHOCARDIOGRAM  12/10/2020   EF 60 to 65%.  Normal LV size and function.  No heart WMA.  GR 1 DD.  Normal RV, RVP and RAP.[name]  Normal valves.   TRIGGER FINGER RELEASE  12/16/2011   Procedure: RELEASE TRIGGER FINGER/A-1 PULLEY;  Surgeon: Tami Ribas, MD;  Location: [facility];  Service: Orthopedics;  Laterality: Right;  RIGHT LONG FINGER TRIGGER RELEASE & ANNULAR CYST EXCISION   TUMOR EXCISION  age 40   right arm   Patient  Active Problem List   Diagnosis Date Noted   Rectal bleeding 07/27/2022   Hemorrhoids 07/27/2022   Chronic pain of both knees 06/21/2022   Moderate persistent asthma without complication 03/27/2021   PSVT (paroxysmal supraventricular tachycardia) 11/26/2020   Syncope and collapse 11/24/2020   Primary osteoarthritis of right knee 05/12/2020   Primary osteoarthritis of left knee 05/12/2020   Hyperlipidemia    Bilateral calf pain 03/05/2019   Genetic testing 02/13/2019   Family history of ovarian cancer    Family history of bladder cancer    Family history of colon cancer    Family history of leukemia    Lumbar radiculopathy 12/08/2018   Myofascial pain 12/08/2018   Impaired gait and mobility 12/08/2018   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   Chronic nonallergic rhinitis 12/29/2016   Mild persistent asthma, uncomplicated 12/29/2016   Vocal fold paralysis, 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   Gastroesophageal reflux disease 12/29/2016   Cervical spondylosis with myelopathy and radiculopathy 11/14/2013   Essential tremor 06/13/2013   Cervical spondylosis without myelopathy 06/13/2013   Breast cancer of lower-outer quadrant of left female breast (HCC) 09/10/2010    ONSET DATE: 07/22/2022 (Date of referral)  REFERRING DIAG:  R26.81 (ICD-10-CM) - Gait instability  R25.1 (ICD-10-CM) - Tremor  R27.9 (ICD-10-CM) - Incoordination    THERAPY DIAG:  Other lack of coordination  Tremor  Unsteadiness on feet  Rationale for Evaluation and Treatment: Rehabilitation  SUBJECTIVE:   SUBJECTIVE STATEMENT:  I see Dr. Arbutus Leas Friday for a biopsy. Pt reports no recent falls  Pt accompanied by: self  PERTINENT HISTORY: Hx of essential tremor and previously diagnosed with Parkinson's; however, she is to undergo further testing to see if it confirms this diagnosis. Pt referred to OP OT following Parkinson's screen due to concerns with UE coordination.   PRECAUTIONS: Fall  WEIGHT BEARING RESTRICTIONS:  No  PAIN:  Are you having pain? Yes: NPRS scale: 3/10 Pain location: B knees  FALLS: Has patient fallen in last 6 months? No  LIVING ENVIRONMENT: Lives with: lives with their family and lives alone Lives in: House/apartment Stairs: Yes: Internal: 12-13 steps; can reach both and External: 3 steps; can reach both; she is able to live on main floor Has following equipment at home: Single point cane, Walker - 4 wheeled, shower chair, and Grab bars  PLOF: Independent; driving; retired Therapist, nutritional and Research officer, political party; reading; watching tv; visiting friends  PATIENT GOALS: to stay as independent as possible for as long as possible  OBJECTIVE:   HAND DOMINANCE: Right  ADLs: Overall ADLs: mod I  IADLs: Light housekeeping: hired help every 2  weeks  Handwriting: 75% legible and worse at end of day  MOBILITY STATUS: Independent and uses cane occassionally  POSTURE COMMENTS:  rounded shoulders  ACTIVITY TOLERANCE: Activity tolerance: good  FUNCTIONAL OUTCOME MEASURES: Fastening/unfastening 3 buttons: 36 Physical performance test: PPT#2 (simulated eating) 12 seconds & PPT#4 (donning/doffing jacket): 16  COORDINATION: 9 Hole Peg test: Right: 44 sec; Left: 32 sec  UE ROM:  WFL  UE MMT:   [facility]  08/02/2022 - Grip: R - 26.6 lbs; L - 27.1 lbs  SENSATION: WFL  MUSCLE TONE: RUE: Mild and Rigidity and LUE: Mild and Rigidity  COGNITION: Overall cognitive status: Within functional limits for tasks assessed  OBSERVATIONS: Dyskinesias and essential tremor   TODAY'S TREATMENT:                                                                                                                               Practiced donning/doffing jacket - pt doing well. Just reviewed strategies Practiced hooking/unhooking buttons w/ strategies to increase effectiveness. Pt did better w/ these strategies  Pt issued coordination HEP and return demo - see pt instructions for details   PATIENT  EDUCATION: Education details: coordination HEP  Person educated: Patient Education method: Explanation, Demonstration, and Handouts Education comprehension: verbalized understanding and needs further education  HOME EXERCISE PROGRAM: 08/02/2022: PWR! Hands and prior HEPs 08/09/2022: Tremor compensatory strategies 08/11/22: writing strategies 08/16/22: Coordination HEP    GOALS:  LONG TERM GOALS: Target date: 08/27/2022    Pt will be independent with HEP.  Baseline: not yet initiated Goal status: In Progress  2.  Pt will verbalize understanding of adapted strategies to maximize safety and independence with ADLs/IADLs.  Baseline: not yet initiated Goal status: IN PROGRESS  3.  Pt will demonstrate improved fine motor coordination for ADLs as evidenced by decreasing 9 hole peg test score for R hand by at least 10 secs  Baseline: 44 secs Goal status: INITIAL  5.  Pt will demonstrate improved ease with fastening buttons as evidenced by decreasing 3 button/unbutton time by at least 4 seconds  Baseline: 36 seconds Goal status: IN PROGRESS  6.  Pt will write a short paragraph with no significant decrease in size and maintain 100% legibility.  Baseline: 75% legibility Goal status: IN PROGRESS  7. Pt will demonstrate increased ease with dressing as evidenced by decreasing PPT#4 (don/ doff jacket) to 13 secs or less.  Baseline: 16 secs Goal status: IN PROGRESS    ASSESSMENT:  CLINICAL IMPRESSION: Pt progressing towards goals - benefits from reinforcement and review  PERFORMANCE DEFICITS: in functional skills including ADLs, IADLs, coordination, strength, Fine motor control, mobility, and UE functional use  IMPAIRMENTS: are limiting patient from ADLs, IADLs, and leisure.   COMORBIDITIES:  may have co-morbidities  that affects occupational performance. Patient will benefit from skilled OT to address above impairments and improve overall function.  REHAB POTENTIAL:  Good  PLAN:  OT FREQUENCY: 1-2x/week  OT DURATION: 4  weeks  PLANNED INTERVENTIONS: self care/ADL training, therapeutic exercise, therapeutic activity, functional mobility training, patient/family education, coping strategies training, DME and/or AE instructions, and Re-evaluation  RECOMMENDED OTHER SERVICES: none at this time  CONSULTED AND AGREED WITH PLAN OF CARE: Patient  PLAN FOR NEXT SESSION: review coordination HEP, posture, progress towards remaining goals   Sheran Lawless, OT 08/11/2022, 12:12 PM

## 2022-08-16 NOTE — Therapy (Signed)
OUTPATIENT PHYSICAL THERAPY NEURO TREATMENT   Patient Name: April Church MRN: [id number] DOB:May 19, 1940, 82 y.o., female Today's Date: 08/16/2022   PCP: Lorenda Ishihara, MD REFERRING PROVIDER: Vladimir Faster, DO  END OF SESSION:  PT End of Session - 08/16/22 1236     Visit Number 5    Number of Visits 13   Plus eval   Date for PT Re-Evaluation 09/09/22    Authorization Type Medicare A & B    PT Start Time 1233    PT Stop Time 1313    PT Time Calculation (min) 40 min    Equipment Utilized During Treatment Gait belt    Activity Tolerance Patient tolerated treatment well    Behavior During Therapy WFL for tasks assessed/performed             Past Medical History:  Diagnosis Date   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 2011- no further tx. now- Dr. Donnie Coffin , Dr. Dayton Scrape   Cyst of finger 11/2011   annular cyst right long finger   Dental crowns present    Dermatitis    Frequency of urination    GERD (gastroesophageal reflux disease)    Hemorrhoid    History of colon polyps 2009   Also noted 2012 and 2017 by colonoscopy.   History of shingles 1971   Had right ischial recurrence in the March 2019   Hyperlipidemia    On atorvastatin   Hypertension    under control, has been on med. x 2 yrs.   Liposarcoma of left shoulder (HCC) 1969   Parkinson's disease    With mild tremor (neurologist Dr. Marjory Lies), neurosurgeon Dr. Lovell Sheehan   PONV (postoperative nausea and vomiting)    Seasonal allergies    Shingles of eyelid 1971   Right eyelid; recurrent -> last episode 05/11/2017   Tremors of nervous system    hands-essential tremor; associated with Parkinson's.   Trigger finger of right hand 11/2011   long finger   Past Surgical History:  Procedure Laterality Date   14-day Zio Patch Monitor  08/2020   (report to  be scanned): Predominant SR w/ HR 61 to 142 bpm and average 86 bpm.  Total of 59 episodes of PAT/PSVT  4 to 15 beats (not noted on diary).  Fastest was 5 beats at a rate of 193 bpm, longest was 15 beats at a rate of 106 bpm.  Rare isolated PACs (as well as couplets and triplets) with rare isolated PVCs.  No sustained arrhythmias or bradycardia to explain syncope.   ANTERIOR CERVICAL DECOMPRESSION/DISCECTOMY FUSION 4 LEVELS N/A 11/14/2013   Procedure: ANTERIOR CERVICAL DECOMPRESSION/DISCECTOMY FUSION 4 LEVELS;  Surgeon: Tressie Stalker, MD;  Location: [facility];  Service: Neurosurgery;  Laterality: N/A;  C34 C45 C56 C67 anterior cervical fusion with interbody prosthesis plating and  bonegraft   APPENDECTOMY  age 23   BREAST LUMPECTOMY  06/16/2009   left; SLN bx.   BREAST LUMPECTOMY  07/01/2009   re-excision   BREAST SURGERY  02/22/1997   reduction   CATARACT EXTRACTION, BILATERAL     COLONOSCOPY WITH PROPOFOL N/A 12/03/2014   Procedure: COLONOSCOPY WITH PROPOFOL;  Surgeon: Charolett Bumpers, MD;  Location: [facility];  Service: Endoscopy;  Laterality: N/A;   FOOT SURGERY  06/22/2011   left   KNEE ARTHROSCOPY  03/12/2005  right   KNEE ARTHROSCOPY     left   Lower Extremity Venous Dopplers  12/12/2020   No DVT bilaterally in the deep veins or superficial veins.  No deep or superficial venous reflux noted bilaterally with exception of Right SSV at the knee.   NASAL SINUS SURGERY     x 2   NEPHRECTOMY LIVING DONOR Left 04/22/2008   donated to spouse 2010([facility])   TRANSTHORACIC ECHOCARDIOGRAM  12/10/2020   EF 60 to 65%.  Normal LV size and function.  No heart WMA.  GR 1 DD.  Normal RV, RVP and RAP.[name]  Normal valves.   TRIGGER FINGER RELEASE  12/16/2011   Procedure: RELEASE TRIGGER FINGER/A-1 PULLEY;  Surgeon: Tami Ribas, MD;  Location: [facility];  Service: Orthopedics;  Laterality: Right;  RIGHT LONG FINGER TRIGGER RELEASE & ANNULAR CYST EXCISION   TUMOR EXCISION  age  84   right arm   Patient Active Problem List   Diagnosis Date Noted   Rectal bleeding 07/27/2022   Hemorrhoids 07/27/2022   Chronic pain of both knees 06/21/2022   Moderate persistent asthma without complication 03/27/2021   PSVT (paroxysmal supraventricular tachycardia) 11/26/2020   Syncope and collapse 11/24/2020   Primary osteoarthritis of right knee 05/12/2020   Primary osteoarthritis of left knee 05/12/2020   Hyperlipidemia    Bilateral calf pain 03/05/2019   Genetic testing 02/13/2019   Family history of ovarian cancer    Family history of bladder cancer    Family history of colon cancer    Family history of leukemia    Lumbar radiculopathy 12/08/2018   Myofascial pain 12/08/2018   Impaired gait and mobility 12/08/2018   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   Chronic nonallergic rhinitis 12/29/2016   Mild persistent asthma, uncomplicated 12/29/2016   Vocal fold paralysis, 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   Gastroesophageal reflux disease 12/29/2016   Cervical spondylosis with myelopathy and radiculopathy 11/14/2013   Essential tremor 06/13/2013   Cervical spondylosis without myelopathy 06/13/2013   Breast cancer of lower-outer quadrant of left female breast (HCC) 09/10/2010    ONSET DATE: 07/22/2022 (referral)  REFERRING DIAG: R26.81 (ICD-10-CM) - Gait instability R25.1 (ICD-10-CM) - Tremor R27.9 (ICD-10-CM) - Incoordination  THERAPY DIAG:  Unsteadiness on feet  Abnormal posture  Other abnormalities of gait and mobility  Rationale for Evaluation and Treatment: Rehabilitation  SUBJECTIVE:                                                                                                                                                                                             SUBJECTIVE STATEMENT: No changes, no falls. Pt felt more balanced  when doing her stairs over the weekend.    Pt accompanied by: self  PERTINENT HISTORY: Acquired solitary kidney (04/2008), Asthma, Breast  cancer (HCC) (05/2009), Cyst of finger (11/2011), Dental crowns present, Dermatitis, Frequency of urination, GERD (gastroesophageal reflux disease), Hemorrhoid, History of colon polyps (2009), History of shingles (1971), Hyperlipidemia, Hypertension, Liposarcoma of left shoulder (HCC) (1969), Parkinson's disease, PONV (postoperative nausea and vomiting), Seasonal allergies, Shingles of eyelid (1971), Tremors of nervous system, and Trigger finger of right hand (11/2011). here with essential tremor since 1990's. Now with intermittent resting tremor AND bradykinesia since 2016. May represent essential tremor with superimposed parkinson's disease + cervical myelopathy sequelae  PAIN:  Are you having pain? Yes, pt reports some knee pain   PRECAUTIONS: Fall  WEIGHT BEARING RESTRICTIONS: No  FALLS: Has patient fallen in last 6 months? No  LIVING ENVIRONMENT: Lives with: lives alone Lives in: House/apartment Stairs: Yes: Internal: full flight steps; bilateral but cannot reach both and External: 3 steps; on right going up, on left going up, and can reach both Has following equipment at home: Single point cane and Walker - 4 wheeled  PLOF: Independent  PATIENT GOALS: "Being able to control my balance better"  OBJECTIVE:   COGNITION: Overall cognitive status: Within functional limits for tasks assessed   SENSATION: Denies numbness/tingling in BUE/BLEs  COORDINATION: Heel to shin test: WNL bilaterally    POSTURE: rounded shoulders, forward head, and increased thoracic kyphosis  LOWER EXTREMITY ROM:     Active  Right Eval Left Eval  Hip flexion    Hip extension    Hip abduction    Hip adduction    Hip internal rotation    Hip external rotation    Knee flexion    Knee extension    Ankle dorsiflexion    Ankle plantarflexion    Ankle inversion    Ankle eversion     (Blank rows = not tested)  LOWER EXTREMITY MMT:  Tested in seated position   MMT Right Eval Left Eval  Hip  flexion 4- 4  Hip extension    Hip abduction 4 4  Hip adduction 4 4  Hip internal rotation    Hip external rotation    Knee flexion 4- 4  Knee extension 4- 4  Ankle dorsiflexion 4 4  Ankle plantarflexion    Ankle inversion    Ankle eversion    (Blank rows = not tested)  BED MOBILITY:  Independent per pt   TRANSFERS: Assistive device utilized: None  Sit to stand: Modified independence Stand to sit: Modified independence    TODAY'S TREATMENT:             GAIT: Gait pattern: lateral gait deviations, step through pattern, decreased arm swing- Right, decreased arm swing- Left, decreased stride length, knee flexed in stance- Right, knee flexed in stance- Left, lateral hip instability, trunk flexed, and narrow BOS Distance walked: Various clinic distances  Assistive device utilized: [facility] Comments: Intermittent cues for sequencing and posture. Pt reporting that she has been using her cane more at home    NMR:  With 6 blaze pods on floor, 2 on 6" step for incr foot clearance, remainder laterally and in diagonal direction for improved weight shifting, SLS, visual scanning, reaction times. Had pt stand on air ex for compliant surface with intermittent UE support for balance  Performed 4 bouts of 1 minute with 30 second rest in between each: 18 hits, 20 hits, 25 hits, 25 hits Pt losing balance posteriorly to  mat table a few times   Resisted gait 115' x  2, with one PT providing min guard and 2nd giving resistance, 115' given steady resistance, next 53' with variable resistance with pt able to utilize stepping strategy each time for balance recovery  On air ex:  sit > stand with no UE support > PWR Up > sitting x10 reps  At countertop, lateral weight shifting and reach to sticky note for visual target x10 reps each side, then progressed to lifting contralateral leg for dynamic SLS x8 reps each time, pt unable to lift foot off the ground  On rockerboard in A/P direction: Keeping board  still head turns x10 reps, head nods x10 reps, cues to turn head vs. Just turning body  Twisting to R/L and tossing ball with therapist x12 reps each direction, pt needing intermittent UE support for balance, cued to look at ball when tossing    PATIENT EDUCATION: Education details: Continue HEP, use of cane even in the house for balance  Person educated: Patient Education method: Medical illustrator Education comprehension: verbalized understanding, returned demonstration, and needs further education  HOME EXERCISE PROGRAM: Access Code: [email or web address] URL: [email or web address] Date: 08/02/2022 Prepared by: Sherlie Ban  Exercises - Seated Hamstring Stretch  - 1 x daily - 5 x weekly - 1 sets - 3 reps - 30 sec hold - Sit to Stand Without Arm Support  - 1 x daily - 7 x weekly - 3 sets - 10 reps - Standing Balance with Eyes Closed on Foam  - 1 x daily - 5 x weekly - 3 sets - 30 hold - Romberg Stance on Foam Pad with Head Rotation  - 1 x daily - 5 x weekly - 2 sets - 10 reps - Walking with Head Rotation  - 1 x daily - 5 x weekly - 3 sets - Side to Side Weight Shift with Overhead Reach and Counter Support  - 1 x daily - 5 x weekly - 1 sets - 10 reps   GOALS: Goals reviewed with patient? Yes  SHORT TERM GOALS: Target date: 08/26/2022   Pt will be independent with initial HEP for improved strength, balance, transfers and gait.  Baseline: to be reviewed from previous bout of therapy Goal status: INITIAL  2.  Pt will improve FGA to at least a 17/30 in order to demo decr fall risk. Baseline: 14/30 Goal status: INITIAL  3.  Pt will improve condition 4 of mCTSIB to at least 8 seconds in order to demo improved vestibular input for balance.  Baseline: 3-4 seconds  Goal status: INITIAL  4.  Pt will trial various assistive devices in clinic to determine safest option for home use for fall prevention  Baseline:  Goal status: INITIAL  5.  Pt will improve gait velocity  to at least 2.9 ft/s w/LRAD and SBA for improved gait efficiency and reduced fall risk   Baseline: 2.6 ft/s no AD Goal status: INITIAL  6.  Pt will teach back and demonstrate fall prevention techniques to implement at home and in community for reduced fall risk  Baseline:  Goal status: INITIAL  LONG TERM GOALS: Target date: 09/09/2022   Pt will be independent with final HEP for improved strength, balance, transfers and gait.  Baseline:  Goal status: INITIAL  2.  Pt will improve condition 4 of mCTSIB to at least 12 seconds in order to demo improved vestibular input for balance. Baseline: 3-4 seconds  Goal status: INITIAL  3.  Pt  will improve FGA to at least a 20/30 in order to demo decr fall risk. Baseline: 14/30 Goal status: INITIAL  4.  Pt will improve gait velocity to at least 3.1 ft/s w/LRAD mod I for improved gait efficiency and independence  Baseline: 2.6 ft/s no AD Goal status: INITIAL  5.  Pt will improve 5 x STS to less than or equal to 14 w/o UE support seconds to demonstrate improved functional strength and transfer efficiency.   Baseline: 18.5s no UE support Goal status: INITIAL   ASSESSMENT:  CLINICAL IMPRESSION: Today's skilled session focused on balance strategies on unlevel surfaces for weight shifting, SLS tasks, and resisted gait for stepping strategies for balance. With resisted gait with unexpected perturbations, pt did a good job maintaining balance utilizing the appropriate stepping strategy. Educated to use cane in the house for balance as well as pt reports she is still furniture walking. Pt plans on getting a cane to keep in the car and in the home. Will continue per POC.    OBJECTIVE IMPAIRMENTS: Abnormal gait, decreased activity tolerance, decreased balance, decreased coordination, decreased endurance, decreased knowledge of use of DME, decreased mobility, difficulty walking, decreased ROM, decreased strength, improper body mechanics, postural  dysfunction, and pain  ACTIVITY LIMITATIONS: carrying, lifting, bending, squatting, stairs, transfers, and locomotion level  PARTICIPATION LIMITATIONS: shopping, community activity, yard work, and church  PERSONAL FACTORS: Age, Fitness, and 1 comorbidity: essential tremor  are also affecting patient's functional outcome.   REHAB POTENTIAL: Good  CLINICAL DECISION MAKING: Evolving/moderate complexity  EVALUATION COMPLEXITY: Moderate  PLAN:  PT FREQUENCY: 1-2x/week  PT DURATION: 6 weeks  PLANNED INTERVENTIONS: Therapeutic exercises, Therapeutic activity, Neuromuscular re-education, Balance training, Gait training, Patient/Family education, Self Care, Joint mobilization, Stair training, Vestibular training, Canalith repositioning, DME instructions, Manual therapy, and Re-evaluation  PLAN FOR NEXT SESSION: STGS DUE NEXT WEEK!!   continue working on gait with cane/balance stick. How did stairs go? Work on balance on compliant surfaces, retro gait, head motions, obstacles, SLS    Drake Leach, PT, DPT 08/16/2022, 1:21 PM

## 2022-08-18 ENCOUNTER — Ambulatory Visit: Payer: Medicare Other | Admitting: Occupational Therapy

## 2022-08-18 ENCOUNTER — Encounter: Payer: Self-pay | Admitting: Physical Therapy

## 2022-08-18 ENCOUNTER — Ambulatory Visit: Payer: Medicare Other | Admitting: Physical Therapy

## 2022-08-18 ENCOUNTER — Encounter: Payer: Self-pay | Admitting: Occupational Therapy

## 2022-08-18 DIAGNOSIS — R278 Other lack of coordination: Secondary | ICD-10-CM

## 2022-08-18 DIAGNOSIS — R2681 Unsteadiness on feet: Secondary | ICD-10-CM | POA: Diagnosis not present

## 2022-08-18 DIAGNOSIS — R2689 Other abnormalities of gait and mobility: Secondary | ICD-10-CM | POA: Diagnosis not present

## 2022-08-18 DIAGNOSIS — R279 Unspecified lack of coordination: Secondary | ICD-10-CM | POA: Diagnosis not present

## 2022-08-18 DIAGNOSIS — R293 Abnormal posture: Secondary | ICD-10-CM

## 2022-08-18 DIAGNOSIS — R251 Tremor, unspecified: Secondary | ICD-10-CM | POA: Diagnosis not present

## 2022-08-18 NOTE — Patient Instructions (Signed)
PELVIC TILT: Anterior    Start in slumped position. Roll pelvis forward to arch back. Pretend someone has a string on your belly button and is pulling it forward.  Hold 5-10 sec. Repeat__10_ reps per set, _6__ sets per day

## 2022-08-18 NOTE — Therapy (Signed)
OUTPATIENT PHYSICAL THERAPY NEURO TREATMENT   Patient Name: April Church MRN: [id number] DOB:1940-07-10, 82 y.o., female Today's Date: 08/18/2022   PCP: Lorenda Ishihara, MD REFERRING PROVIDER: Vladimir Faster, DO  END OF SESSION:  PT End of Session - 08/18/22 1404     Visit Number 6    Number of Visits 13   Plus eval   Date for PT Re-Evaluation 09/09/22    Authorization Type Medicare A & B    PT Start Time 1403    PT Stop Time 1442    PT Time Calculation (min) 39 min    Equipment Utilized During Treatment Gait belt    Activity Tolerance Patient tolerated treatment well    Behavior During Therapy WFL for tasks assessed/performed             Past Medical History:  Diagnosis Date   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 2011- no further tx. now- Dr. Donnie Coffin , Dr. Dayton Scrape   Cyst of finger 11/2011   annular cyst right long finger   Dental crowns present    Dermatitis    Frequency of urination    GERD (gastroesophageal reflux disease)    Hemorrhoid    History of colon polyps 2009   Also noted 2012 and 2017 by colonoscopy.   History of shingles 1971   Had right ischial recurrence in the March 2019   Hyperlipidemia    On atorvastatin   Hypertension    under control, has been on med. x 2 yrs.   Liposarcoma of left shoulder (HCC) 1969   Parkinson's disease    With mild tremor (neurologist Dr. Marjory Lies), neurosurgeon Dr. Lovell Sheehan   PONV (postoperative nausea and vomiting)    Seasonal allergies    Shingles of eyelid 1971   Right eyelid; recurrent -> last episode 05/11/2017   Tremors of nervous system    hands-essential tremor; associated with Parkinson's.   Trigger finger of right hand 11/2011   long finger   Past Surgical History:  Procedure Laterality Date   14-day Zio Patch Monitor  08/2020   (report to  be scanned): Predominant SR w/ HR 61 to 142 bpm and average 86 bpm.  Total of 59 episodes of PAT/PSVT  4 to 15 beats (not noted on diary).  Fastest was 5 beats at a rate of 193 bpm, longest was 15 beats at a rate of 106 bpm.  Rare isolated PACs (as well as couplets and triplets) with rare isolated PVCs.  No sustained arrhythmias or bradycardia to explain syncope.   ANTERIOR CERVICAL DECOMPRESSION/DISCECTOMY FUSION 4 LEVELS N/A 11/14/2013   Procedure: ANTERIOR CERVICAL DECOMPRESSION/DISCECTOMY FUSION 4 LEVELS;  Surgeon: Tressie Stalker, MD;  Location: [facility];  Service: Neurosurgery;  Laterality: N/A;  C34 C45 C56 C67 anterior cervical fusion with interbody prosthesis plating and  bonegraft   APPENDECTOMY  age 71   BREAST LUMPECTOMY  06/16/2009   left; SLN bx.   BREAST LUMPECTOMY  07/01/2009   re-excision   BREAST SURGERY  02/22/1997   reduction   CATARACT EXTRACTION, BILATERAL     COLONOSCOPY WITH PROPOFOL N/A 12/03/2014   Procedure: COLONOSCOPY WITH PROPOFOL;  Surgeon: Charolett Bumpers, MD;  Location: [facility];  Service: Endoscopy;  Laterality: N/A;   FOOT SURGERY  06/22/2011   left   KNEE ARTHROSCOPY  03/12/2005  right   KNEE ARTHROSCOPY     left   Lower Extremity Venous Dopplers  12/12/2020   No DVT bilaterally in the deep veins or superficial veins.  No deep or superficial venous reflux noted bilaterally with exception of Right SSV at the knee.   NASAL SINUS SURGERY     x 2   NEPHRECTOMY LIVING DONOR Left 04/22/2008   donated to spouse 2010([facility])   TRANSTHORACIC ECHOCARDIOGRAM  12/10/2020   EF 60 to 65%.  Normal LV size and function.  No heart WMA.  GR 1 DD.  Normal RV, RVP and RAP.[name]  Normal valves.   TRIGGER FINGER RELEASE  12/16/2011   Procedure: RELEASE TRIGGER FINGER/A-1 PULLEY;  Surgeon: Tami Ribas, MD;  Location: [facility];  Service: Orthopedics;  Laterality: Right;  RIGHT LONG FINGER TRIGGER RELEASE & ANNULAR CYST EXCISION   TUMOR EXCISION  age  74   right arm   Patient Active Problem List   Diagnosis Date Noted   Rectal bleeding 07/27/2022   Hemorrhoids 07/27/2022   Chronic pain of both knees 06/21/2022   Moderate persistent asthma without complication 03/27/2021   PSVT (paroxysmal supraventricular tachycardia) 11/26/2020   Syncope and collapse 11/24/2020   Primary osteoarthritis of right knee 05/12/2020   Primary osteoarthritis of left knee 05/12/2020   Hyperlipidemia    Bilateral calf pain 03/05/2019   Genetic testing 02/13/2019   Family history of ovarian cancer    Family history of bladder cancer    Family history of colon cancer    Family history of leukemia    Lumbar radiculopathy 12/08/2018   Myofascial pain 12/08/2018   Impaired gait and mobility 12/08/2018   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   Chronic nonallergic rhinitis 12/29/2016   Mild persistent asthma, uncomplicated 12/29/2016   Vocal fold paralysis, 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   Gastroesophageal reflux disease 12/29/2016   Cervical spondylosis with myelopathy and radiculopathy 11/14/2013   Essential tremor 06/13/2013   Cervical spondylosis without myelopathy 06/13/2013   Breast cancer of lower-outer quadrant of left female breast (HCC) 09/10/2010    ONSET DATE: 07/22/2022 (referral)  REFERRING DIAG: R26.81 (ICD-10-CM) - Gait instability R25.1 (ICD-10-CM) - Tremor R27.9 (ICD-10-CM) - Incoordination  THERAPY DIAG:  Other lack of coordination  Abnormal posture  Unsteadiness on feet  Other abnormalities of gait and mobility  Rationale for Evaluation and Treatment: Rehabilitation  SUBJECTIVE:                                                                                                                                                                                             SUBJECTIVE STATEMENT: Has been working  on her exercises at home. No falls. Using the cane more and getting around is getting better.    Pt accompanied by: self  PERTINENT  HISTORY: Acquired solitary kidney (04/2008), Asthma, Breast cancer (HCC) (05/2009), Cyst of finger (11/2011), Dental crowns present, Dermatitis, Frequency of urination, GERD (gastroesophageal reflux disease), Hemorrhoid, History of colon polyps (2009), History of shingles (1971), Hyperlipidemia, Hypertension, Liposarcoma of left shoulder (HCC) (1969), Parkinson's disease, PONV (postoperative nausea and vomiting), Seasonal allergies, Shingles of eyelid (1971), Tremors of nervous system, and Trigger finger of right hand (11/2011). here with essential tremor since 1990's. Now with intermittent resting tremor AND bradykinesia since 2016. May represent essential tremor with superimposed parkinson's disease + cervical myelopathy sequelae  PAIN:  Are you having pain? Yes, pt reports some knee pain   PRECAUTIONS: Fall  WEIGHT BEARING RESTRICTIONS: No  FALLS: Has patient fallen in last 6 months? No  LIVING ENVIRONMENT: Lives with: lives alone Lives in: House/apartment Stairs: Yes: Internal: full flight steps; bilateral but cannot reach both and External: 3 steps; on right going up, on left going up, and can reach both Has following equipment at home: Single point cane and Walker - 4 wheeled  PLOF: Independent  PATIENT GOALS: "Being able to control my balance better"  OBJECTIVE:   COGNITION: Overall cognitive status: Within functional limits for tasks assessed   SENSATION: Denies numbness/tingling in BUE/BLEs  COORDINATION: Heel to shin test: WNL bilaterally    POSTURE: rounded shoulders, forward head, and increased thoracic kyphosis  LOWER EXTREMITY ROM:     Active  Right Eval Left Eval  Hip flexion    Hip extension    Hip abduction    Hip adduction    Hip internal rotation    Hip external rotation    Knee flexion    Knee extension    Ankle dorsiflexion    Ankle plantarflexion    Ankle inversion    Ankle eversion     (Blank rows = not tested)  LOWER EXTREMITY MMT:  Tested  in seated position   MMT Right Eval Left Eval  Hip flexion 4- 4  Hip extension    Hip abduction 4 4  Hip adduction 4 4  Hip internal rotation    Hip external rotation    Knee flexion 4- 4  Knee extension 4- 4  Ankle dorsiflexion 4 4  Ankle plantarflexion    Ankle inversion    Ankle eversion    (Blank rows = not tested)  BED MOBILITY:  Independent per pt   TRANSFERS: Assistive device utilized: None  Sit to stand: Modified independence Stand to sit: Modified independence    TODAY'S TREATMENT:             GAIT: Gait pattern: lateral gait deviations, step through pattern, decreased arm swing- Right, decreased arm swing- Left, decreased stride length, knee flexed in stance- Right, knee flexed in stance- Left, lateral hip instability, trunk flexed, and narrow BOS Distance walked: Various clinic distances  Assistive device utilized: [facility] Comments: Intermittent cues for sequencing and posture. Pt reporting that she has been using her cane more at home    NMR:   Retro gait, 2 x 20' with cued for wider BOS and incr step length, then retro gait with addition of ball toss with 2nd PT 2 x 20' with PT providing CGA/min A for balance. Pt with difficulty coordinating tossing ball and stepping at the same time.  Standing in front of rebounder holding green small ball, alternating forward stepping  for stepping strategy/reactive balance and tossing ball and catching approx. 15-20 reps each side, cues for sequencing and alternating legs between each side and incr amplitude of throwing ball. Pt initially not throwing it hard enough, but with incr reps and amplitude was able to almost catch it everytime.  Pt able to use stepping strategies at times to maintain balance when catching ball.  With blue incline under feet for incr anterior weight shifting, sit <> stands x12 reps with BUE support to stand and big focus on incr lean - trying to keep balance in standing, pt able to for a few seconds (incr  with incr reps, but pt with decr eccentric control back to mat table and loses balance posteriorly), an additional x5 reps with no incline under feet, with pt demonstrating improved weight shifting and standing balance with no retropulsion. Educated on thinking about big movement with nose over toes to come forward each time  On air ex:  Wide BOS > feet hip width distance: EC 3 x 30 seconds, incr postural sway and trunk lean to the R  EC 2 x 10 reps alternating marching (pt just barely able to lift foot off air ex), UE support as needed With 4 smaller orange obstacles next to countertop: Alternating pattern stepping over down and back x4 reps, cues for incr foot clearance with hip/knee flexion with RLE as pt with tendency to circumduct. Pt needing to use intermittent UE support at countertop   PATIENT EDUCATION: Education details: Continue HEP, will check goals next week and determine re-cert vs. D/C  Person educated: Patient Education method: Explanation and Demonstration Education comprehension: verbalized understanding, returned demonstration, and needs further education  HOME EXERCISE PROGRAM: Access Code: [email or web address] URL: [email or web address] Date: 08/02/2022 Prepared by: Sherlie Ban  Exercises - Seated Hamstring Stretch  - 1 x daily - 5 x weekly - 1 sets - 3 reps - 30 sec hold - Sit to Stand Without Arm Support  - 1 x daily - 7 x weekly - 3 sets - 10 reps - Standing Balance with Eyes Closed on Foam  - 1 x daily - 5 x weekly - 3 sets - 30 hold - Romberg Stance on Foam Pad with Head Rotation  - 1 x daily - 5 x weekly - 2 sets - 10 reps - Walking with Head Rotation  - 1 x daily - 5 x weekly - 3 sets - Side to Side Weight Shift with Overhead Reach and Counter Support  - 1 x daily - 5 x weekly - 1 sets - 10 reps   GOALS: Goals reviewed with patient? Yes  SHORT TERM GOALS: Target date: 08/26/2022   Pt will be independent with initial HEP for improved strength, balance,  transfers and gait.  Baseline: to be reviewed from previous bout of therapy Goal status: INITIAL  2.  Pt will improve FGA to at least a 17/30 in order to demo decr fall risk. Baseline: 14/30 Goal status: INITIAL  3.  Pt will improve condition 4 of mCTSIB to at least 8 seconds in order to demo improved vestibular input for balance.  Baseline: 3-4 seconds  Goal status: INITIAL  4.  Pt will trial various assistive devices in clinic to determine safest option for home use for fall prevention  Baseline:  Goal status: INITIAL  5.  Pt will improve gait velocity to at least 2.9 ft/s w/LRAD and SBA for improved gait efficiency and reduced fall risk   Baseline: 2.6 ft/s no AD Goal  status: INITIAL  6.  Pt will teach back and demonstrate fall prevention techniques to implement at home and in community for reduced fall risk  Baseline:  Goal status: INITIAL  LONG TERM GOALS: Target date: 09/09/2022   Pt will be independent with final HEP for improved strength, balance, transfers and gait.  Baseline:  Goal status: INITIAL  2.  Pt will improve condition 4 of mCTSIB to at least 12 seconds in order to demo improved vestibular input for balance. Baseline: 3-4 seconds  Goal status: INITIAL  3.  Pt will improve FGA to at least a 20/30 in order to demo decr fall risk. Baseline: 14/30 Goal status: INITIAL  4.  Pt will improve gait velocity to at least 3.1 ft/s w/LRAD mod I for improved gait efficiency and independence  Baseline: 2.6 ft/s no AD Goal status: INITIAL  5.  Pt will improve 5 x STS to less than or equal to 14 w/o UE support seconds to demonstrate improved functional strength and transfer efficiency.   Baseline: 18.5s no UE support Goal status: INITIAL   ASSESSMENT:  CLINICAL IMPRESSION: Today's skilled session focused on balance strategies for anterior weight shifting (pt tends to have posterior bias), stepping strategies, EC tasks, SLS, and retro gait. With alternating  stepping and tossing ball into rebounder to catch it for reactive balance, pt did a good job with maintaining balance with a  stepping strategy as needed. With balance with EC, pt with tendency to incr weight shift and trunk lean to the R. Will continue per POC.    OBJECTIVE IMPAIRMENTS: Abnormal gait, decreased activity tolerance, decreased balance, decreased coordination, decreased endurance, decreased knowledge of use of DME, decreased mobility, difficulty walking, decreased ROM, decreased strength, improper body mechanics, postural dysfunction, and pain  ACTIVITY LIMITATIONS: carrying, lifting, bending, squatting, stairs, transfers, and locomotion level  PARTICIPATION LIMITATIONS: shopping, community activity, yard work, and church  PERSONAL FACTORS: Age, Fitness, and 1 comorbidity: essential tremor  are also affecting patient's functional outcome.   REHAB POTENTIAL: Good  CLINICAL DECISION MAKING: Evolving/moderate complexity  EVALUATION COMPLEXITY: Moderate  PLAN:  PT FREQUENCY: 1-2x/week  PT DURATION: 6 weeks  PLANNED INTERVENTIONS: Therapeutic exercises, Therapeutic activity, Neuromuscular re-education, Balance training, Gait training, Patient/Family education, Self Care, Joint mobilization, Stair training, Vestibular training, Canalith repositioning, DME instructions, Manual therapy, and Re-evaluation  PLAN FOR NEXT SESSION: STGS DUE NEXT WEEK!!   continue working on gait with cane/balance stick. How did stairs go? Work on balance on compliant surfaces, retro gait, head motions, obstacles, SLS    Drake Leach, PT, DPT 08/18/2022, 2:49 PM

## 2022-08-18 NOTE — Therapy (Signed)
OUTPATIENT OCCUPATIONAL THERAPY TREATMENT  Patient Name: April Church MRN: [id number] DOB:1940/09/20, 82 y.o., female Today's Date: 08/18/2022  PCP: Lorenda Ishihara, MD  REFERRING PROVIDER: Vladimir Faster, DO   END OF SESSION:  OT End of Session - 08/18/22 1315     Visit Number 6    Number of Visits 9    Date for OT Re-Evaluation 08/27/22   currently only scheduled until 08/18/2022   Authorization Type Medicare    OT Start Time 1315    OT Stop Time 1400    OT Time Calculation (min) 45 min    Activity Tolerance Patient tolerated treatment well    Behavior During Therapy [facility] for tasks assessed/performed             Past Medical History:  Diagnosis Date   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 2011- no further tx. now- Dr. Donnie Coffin , Dr. Dayton Scrape   Cyst of finger 11/2011   annular cyst right long finger   Dental crowns present    Dermatitis    Frequency of urination    GERD (gastroesophageal reflux disease)    Hemorrhoid    History of colon polyps 2009   Also noted 2012 and 2017 by colonoscopy.   History of shingles 1971   Had right ischial recurrence in the March 2019   Hyperlipidemia    On atorvastatin   Hypertension    under control, has been on med. x 2 yrs.   Liposarcoma of left shoulder (HCC) 1969   Parkinson's disease    With mild tremor (neurologist Dr. Marjory Lies), neurosurgeon Dr. Lovell Sheehan   PONV (postoperative nausea and vomiting)    Seasonal allergies    Shingles of eyelid 1971   Right eyelid; recurrent -> last episode 05/11/2017   Tremors of nervous system    hands-essential tremor; associated with Parkinson's.   Trigger finger of right hand 11/2011   long finger   Past Surgical History:  Procedure Laterality Date   14-day Zio Patch Monitor  08/2020   (report to be scanned): Predominant SR w/  HR 61 to 142 bpm and average 86 bpm.  Total of 59 episodes of PAT/PSVT  4 to 15 beats (not noted on diary).  Fastest was 5 beats at a rate of 193 bpm, longest was 15 beats at a rate of 106 bpm.  Rare isolated PACs (as well as couplets and triplets) with rare isolated PVCs.  No sustained arrhythmias or bradycardia to explain syncope.   ANTERIOR CERVICAL DECOMPRESSION/DISCECTOMY FUSION 4 LEVELS N/A 11/14/2013   Procedure: ANTERIOR CERVICAL DECOMPRESSION/DISCECTOMY FUSION 4 LEVELS;  Surgeon: Tressie Stalker, MD;  Location: [facility];  Service: Neurosurgery;  Laterality: N/A;  C34 C45 C56 C67 anterior cervical fusion with interbody prosthesis plating and  bonegraft   APPENDECTOMY  age 36   BREAST LUMPECTOMY  06/16/2009   left; SLN bx.   BREAST LUMPECTOMY  07/01/2009   re-excision   BREAST SURGERY  02/22/1997   reduction   CATARACT EXTRACTION, BILATERAL     COLONOSCOPY WITH PROPOFOL N/A 12/03/2014   Procedure: COLONOSCOPY WITH PROPOFOL;  Surgeon: Charolett Bumpers, MD;  Location: [facility];  Service: Endoscopy;  Laterality: N/A;   FOOT SURGERY  06/22/2011   left   KNEE ARTHROSCOPY  03/12/2005   right   KNEE ARTHROSCOPY  left   Lower Extremity Venous Dopplers  12/12/2020   No DVT bilaterally in the deep veins or superficial veins.  No deep or superficial venous reflux noted bilaterally with exception of Right SSV at the knee.   NASAL SINUS SURGERY     x 2   NEPHRECTOMY LIVING DONOR Left 04/22/2008   donated to spouse 2010([facility])   TRANSTHORACIC ECHOCARDIOGRAM  12/10/2020   EF 60 to 65%.  Normal LV size and function.  No heart WMA.  GR 1 DD.  Normal RV, RVP and RAP.[name]  Normal valves.   TRIGGER FINGER RELEASE  12/16/2011   Procedure: RELEASE TRIGGER FINGER/A-1 PULLEY;  Surgeon: Tami Ribas, MD;  Location: [facility];  Service: Orthopedics;  Laterality: Right;  RIGHT LONG FINGER TRIGGER RELEASE & ANNULAR CYST EXCISION   TUMOR EXCISION  age 35   right arm   Patient  Active Problem List   Diagnosis Date Noted   Rectal bleeding 07/27/2022   Hemorrhoids 07/27/2022   Chronic pain of both knees 06/21/2022   Moderate persistent asthma without complication 03/27/2021   PSVT (paroxysmal supraventricular tachycardia) 11/26/2020   Syncope and collapse 11/24/2020   Primary osteoarthritis of right knee 05/12/2020   Primary osteoarthritis of left knee 05/12/2020   Hyperlipidemia    Bilateral calf pain 03/05/2019   Genetic testing 02/13/2019   Family history of ovarian cancer    Family history of bladder cancer    Family history of colon cancer    Family history of leukemia    Lumbar radiculopathy 12/08/2018   Myofascial pain 12/08/2018   Impaired gait and mobility 12/08/2018   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   Chronic nonallergic rhinitis 12/29/2016   Mild persistent asthma, uncomplicated 12/29/2016   Vocal fold paralysis, 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   Gastroesophageal reflux disease 12/29/2016   Cervical spondylosis with myelopathy and radiculopathy 11/14/2013   Essential tremor 06/13/2013   Cervical spondylosis without myelopathy 06/13/2013   Breast cancer of lower-outer quadrant of left female breast (HCC) 09/10/2010    ONSET DATE: 07/22/2022 (Date of referral)  REFERRING DIAG:  R26.81 (ICD-10-CM) - Gait instability  R25.1 (ICD-10-CM) - Tremor  R27.9 (ICD-10-CM) - Incoordination    THERAPY DIAG:  Other lack of coordination  Abnormal posture  Rationale for Evaluation and Treatment: Rehabilitation  SUBJECTIVE:   SUBJECTIVE STATEMENT:  I see Dr. Arbutus Leas Friday for a biopsy. Pt reports no recent falls  Pt accompanied by: self  PERTINENT HISTORY: Hx of essential tremor and previously diagnosed with Parkinson's; however, she is to undergo further testing to see if it confirms this diagnosis. Pt referred to OP OT following Parkinson's screen due to concerns with UE coordination.   PRECAUTIONS: Fall  WEIGHT BEARING RESTRICTIONS: No  PAIN:  Are you  having pain? Yes: NPRS scale: 3-5/10 Pain location: B knees  FALLS: Has patient fallen in last 6 months? No  LIVING ENVIRONMENT: Lives with: lives with their family and lives alone Lives in: House/apartment Stairs: Yes: Internal: 12-13 steps; can reach both and External: 3 steps; can reach both; she is able to live on main floor Has following equipment at home: Single point cane, Walker - 4 wheeled, shower chair, and Grab bars  PLOF: Independent; driving; retired Therapist, nutritional and Research officer, political party; reading; watching tv; visiting friends  PATIENT GOALS: to stay as independent as possible for as long as possible  OBJECTIVE:   HAND DOMINANCE: Right  ADLs: Overall ADLs: mod I  IADLs: Light housekeeping: hired help every 2 weeks  Handwriting:  75% legible and worse at end of day  MOBILITY STATUS: Independent and uses cane occassionally  POSTURE COMMENTS:  rounded shoulders  ACTIVITY TOLERANCE: Activity tolerance: good  FUNCTIONAL OUTCOME MEASURES: Fastening/unfastening 3 buttons: 36 Physical performance test: PPT#2 (simulated eating) 12 seconds & PPT#4 (donning/doffing jacket): 16  COORDINATION: 9 Hole Peg test: Right: 44 sec; Left: 32 sec  UE ROM:  WFL  UE MMT:   [facility]  08/02/2022 - Grip: R - 26.6 lbs; L - 27.1 lbs  SENSATION: WFL  MUSCLE TONE: RUE: Mild and Rigidity and LUE: Mild and Rigidity  COGNITION: Overall cognitive status: Within functional limits for tasks assessed  OBSERVATIONS: Dyskinesias and essential tremor   TODAY'S TREATMENT:                                                                                                                               Reviewed PWR! Hands HEP - removed PWR! Step do too difficult/confusing for patient.  Reviewed coordination HEP - pt w/ difficulty Rt hand performing certain ex's  Reviewed strategies for writing and practiced - pt able to maintain 90% legibility  Worked on posture - A/P pelvic tilts and PWR! Up seated -  noted pt leans on Rt hip. Pt given visual feedback (mirror) and was able to correct better w/ mirror. Pt issued ex for posture: anterior pelvic tilt/trunk ext (sitting up tall)    PATIENT EDUCATION: Education details: review of coordination HEP and PWR! Hands, Posture ex Person educated: Patient Education method: Explanation, Demonstration, and Handouts Education comprehension: verbalized understanding and needs further education  HOME EXERCISE PROGRAM: 08/02/2022: PWR! Hands and prior HEPs 08/09/2022: Tremor compensatory strategies 08/11/22: writing strategies 08/16/22: Coordination HEP  08/18/22: Posture ex   GOALS:  LONG TERM GOALS: Target date: 08/27/2022    Pt will be independent with HEP.  Baseline: not yet initiated Goal status: In Progress  2.  Pt will verbalize understanding of adapted strategies to maximize safety and independence with ADLs/IADLs.  Baseline: not yet initiated Goal status: IN PROGRESS  3.  Pt will demonstrate improved fine motor coordination for ADLs as evidenced by decreasing 9 hole peg test score for R hand by at least 10 secs  Baseline: 44 secs Goal status: INITIAL  5.  Pt will demonstrate improved ease with fastening buttons as evidenced by decreasing 3 button/unbutton time by at least 4 seconds  Baseline: 36 seconds Goal status: IN PROGRESS  6.  Pt will write a short paragraph with no significant decrease in size and maintain 90% legibility.  Baseline: 75% legibility Goal status: MET  7. Pt will demonstrate increased ease with dressing as evidenced by decreasing PPT#4 (don/ doff jacket) to 13 secs or less.  Baseline: 16 secs Goal status: IN PROGRESS     ASSESSMENT:  CLINICAL IMPRESSION: Pt progressing towards goals - benefits from reinforcement and review of HEP's and education.   PERFORMANCE DEFICITS: in functional skills including ADLs, IADLs, coordination, strength, Fine  motor control, mobility, and UE functional use  IMPAIRMENTS:  are limiting patient from ADLs, IADLs, and leisure.   COMORBIDITIES:  may have co-morbidities  that affects occupational performance. Patient will benefit from skilled OT to address above impairments and improve overall function.  REHAB POTENTIAL: Good  PLAN:  OT FREQUENCY: 1-2x/week  OT DURATION: 4 weeks  PLANNED INTERVENTIONS: self care/ADL training, therapeutic exercise, therapeutic activity, functional mobility training, patient/family education, coping strategies training, DME and/or AE instructions, and Re-evaluation  RECOMMENDED OTHER SERVICES: none at this time  CONSULTED AND AGREED WITH PLAN OF CARE: Patient  PLAN FOR NEXT SESSION: progress towards remaining goals, consider 2-3 bag ex's   Sheran Lawless, OT 08/18/2022, 1:17 PM

## 2022-08-20 ENCOUNTER — Ambulatory Visit (INDEPENDENT_AMBULATORY_CARE_PROVIDER_SITE_OTHER): Payer: Medicare Other | Admitting: Neurology

## 2022-08-20 DIAGNOSIS — R258 Other abnormal involuntary movements: Secondary | ICD-10-CM

## 2022-08-20 NOTE — Procedures (Signed)
Punch Biopsy Procedure Note  Preprocedure Diagnosis: Bradykinesia; Tremor;   Postprocedure Diagnosis: same  Locations: Site 1: posterior cervical, left Site 2: above, left knee;  Site 3: above, left foot  Indications: r/o alpha synucleinopathy  Anesthesia: 3 mL Lidocaine 1% with epinephrine without added sodium bicarbonate  Procedure Details Patient informed of the risks (including but not limited to bleeding, pain, infection, scar and infection) and benefits of the procedure.  Informed consent obtained.  The areas which were chosen for biopsy, as above, and surrounding areas were given a sterile prep using betadyne and draped in the usual sterile fashion. The skin was then stretched perpendicular to the skin tension lines and sample removed using the 3 mm punch. Pressure applied, hemostasis achieved.   Dressing applied. The specimen(s) was sent for pathologic examination. The patient tolerated the procedure well.  Estimated Blood Loss: 0 ml  Condition: Stable  Complications: none.  Plan: 1. Instructed to keep the wound dry and covered for 24-48h and clean thereafter. 2. Warning signs of infection were reviewed.

## 2022-08-23 ENCOUNTER — Ambulatory Visit: Payer: Medicare Other | Attending: Diagnostic Neuroimaging | Admitting: Physical Therapy

## 2022-08-23 ENCOUNTER — Encounter: Payer: Self-pay | Admitting: Occupational Therapy

## 2022-08-23 ENCOUNTER — Ambulatory Visit: Payer: Medicare Other | Admitting: Occupational Therapy

## 2022-08-23 ENCOUNTER — Encounter: Payer: Self-pay | Admitting: Physical Therapy

## 2022-08-23 DIAGNOSIS — R279 Unspecified lack of coordination: Secondary | ICD-10-CM | POA: Diagnosis not present

## 2022-08-23 DIAGNOSIS — R278 Other lack of coordination: Secondary | ICD-10-CM | POA: Insufficient documentation

## 2022-08-23 DIAGNOSIS — R293 Abnormal posture: Secondary | ICD-10-CM

## 2022-08-23 DIAGNOSIS — R251 Tremor, unspecified: Secondary | ICD-10-CM | POA: Insufficient documentation

## 2022-08-23 DIAGNOSIS — R2681 Unsteadiness on feet: Secondary | ICD-10-CM

## 2022-08-23 DIAGNOSIS — R2689 Other abnormalities of gait and mobility: Secondary | ICD-10-CM | POA: Diagnosis not present

## 2022-08-23 NOTE — Therapy (Signed)
OUTPATIENT OCCUPATIONAL THERAPY TREATMENT  Patient Name: April Church MRN: [id number] DOB:03-10-1940, 82 y.o., female Today's Date: 08/23/2022  PCP: Lorenda Ishihara, MD  REFERRING PROVIDER: Vladimir Faster, DO   END OF SESSION:  OT End of Session - 08/23/22 1350     Visit Number 7    Number of Visits 11    Date for OT Re-Evaluation 09/27/22    Authorization Type Medicare    OT Start Time 1315    OT Stop Time 1400    OT Time Calculation (min) 45 min    Activity Tolerance Patient tolerated treatment well    Behavior During Therapy [facility] for tasks assessed/performed             Past Medical History:  Diagnosis Date   Acquired solitary kidney 04/2008   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 2011- no further tx. now- Dr. Donnie Coffin , Dr. Dayton Scrape   Cyst of finger 11/2011   annular cyst right long finger   Dental crowns present    Dermatitis    Frequency of urination    GERD (gastroesophageal reflux disease)    Hemorrhoid    History of colon polyps 2009   Also noted 2012 and 2017 by colonoscopy.   History of shingles 1971   Had right ischial recurrence in the March 2019   Hyperlipidemia    On atorvastatin   Hypertension    under control, has been on med. x 2 yrs.   Liposarcoma of left shoulder (HCC) 1969   Parkinson's disease    With mild tremor (neurologist Dr. Marjory Lies), neurosurgeon Dr. Lovell Sheehan   PONV (postoperative nausea and vomiting)    Seasonal allergies    Shingles of eyelid 1971   Right eyelid; recurrent -> last episode 05/11/2017   Tremors of nervous system    hands-essential tremor; associated with Parkinson's.   Trigger finger of right hand 11/2011   long finger   Past Surgical History:  Procedure Laterality Date   14-day Zio Patch Monitor  08/2020   (report to be scanned): Predominant SR w/ HR 61 to 142 bpm and average 86 bpm.   Total of 59 episodes of PAT/PSVT  4 to 15 beats (not noted on diary).  Fastest was 5 beats at a rate of 193 bpm, longest was 15 beats at a rate of 106 bpm.  Rare isolated PACs (as well as couplets and triplets) with rare isolated PVCs.  No sustained arrhythmias or bradycardia to explain syncope.   ANTERIOR CERVICAL DECOMPRESSION/DISCECTOMY FUSION 4 LEVELS N/A 11/14/2013   Procedure: ANTERIOR CERVICAL DECOMPRESSION/DISCECTOMY FUSION 4 LEVELS;  Surgeon: Tressie Stalker, MD;  Location: [facility];  Service: Neurosurgery;  Laterality: N/A;  C34 C45 C56 C67 anterior cervical fusion with interbody prosthesis plating and  bonegraft   APPENDECTOMY  age 50   BREAST LUMPECTOMY  06/16/2009   left; SLN bx.   BREAST LUMPECTOMY  07/01/2009   re-excision   BREAST SURGERY  02/22/1997   reduction   CATARACT EXTRACTION, BILATERAL     COLONOSCOPY WITH PROPOFOL N/A 12/03/2014   Procedure: COLONOSCOPY WITH PROPOFOL;  Surgeon: Charolett Bumpers, MD;  Location: [facility];  Service: Endoscopy;  Laterality: N/A;   FOOT SURGERY  06/22/2011   left   KNEE ARTHROSCOPY  03/12/2005   right   KNEE ARTHROSCOPY     left   Lower Extremity Venous  Dopplers  12/12/2020   No DVT bilaterally in the deep veins or superficial veins.  No deep or superficial venous reflux noted bilaterally with exception of Right SSV at the knee.   NASAL SINUS SURGERY     x 2   NEPHRECTOMY LIVING DONOR Left 04/22/2008   donated to spouse 2010([facility])   TRANSTHORACIC ECHOCARDIOGRAM  12/10/2020   EF 60 to 65%.  Normal LV size and function.  No heart WMA.  GR 1 DD.  Normal RV, RVP and RAP.[name]  Normal valves.   TRIGGER FINGER RELEASE  12/16/2011   Procedure: RELEASE TRIGGER FINGER/A-1 PULLEY;  Surgeon: Tami Ribas, MD;  Location: [facility];  Service: Orthopedics;  Laterality: Right;  RIGHT LONG FINGER TRIGGER RELEASE & ANNULAR CYST EXCISION   TUMOR EXCISION  age 20   right arm   Patient Active Problem List   Diagnosis Date  Noted   Rectal bleeding 07/27/2022   Hemorrhoids 07/27/2022   Chronic pain of both knees 06/21/2022   Moderate persistent asthma without complication 03/27/2021   PSVT (paroxysmal supraventricular tachycardia) 11/26/2020   Syncope and collapse 11/24/2020   Primary osteoarthritis of right knee 05/12/2020   Primary osteoarthritis of left knee 05/12/2020   Hyperlipidemia    Bilateral calf pain 03/05/2019   Genetic testing 02/13/2019   Family history of ovarian cancer    Family history of bladder cancer    Family history of colon cancer    Family history of leukemia    Lumbar radiculopathy 12/08/2018   Myofascial pain 12/08/2018   Impaired gait and mobility 12/08/2018   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   Chronic nonallergic rhinitis 12/29/2016   Mild persistent asthma, uncomplicated 12/29/2016   Vocal fold paralysis, 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   Gastroesophageal reflux disease 12/29/2016   Cervical spondylosis with myelopathy and radiculopathy 11/14/2013   Essential tremor 06/13/2013   Cervical spondylosis without myelopathy 06/13/2013   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 I won't know the results for at least a month. Pt reports no recent falls and no pain  Pt accompanied by: self  PERTINENT HISTORY: Hx of essential tremor and previously diagnosed with Parkinson's; however, she is to undergo further testing to see if it confirms this diagnosis. Pt referred to OP OT following Parkinson's screen due to concerns with UE coordination.   PRECAUTIONS: Fall  WEIGHT  BEARING RESTRICTIONS: No  PAIN:  Are you having pain? Yes: NPRS scale: 3-5/10 Pain location: B knees  FALLS: Has patient fallen in last 6 months? No  LIVING ENVIRONMENT: Lives with: lives with their family and lives alone Lives in: House/apartment Stairs: Yes: Internal: 12-13 steps; can reach both and External: 3 steps; can reach both; she is able to live on main floor Has following equipment at home: Single point cane, Walker - 4 wheeled, shower chair, and Grab bars  PLOF: Independent; driving; retired Therapist, nutritional and Research officer, political party; reading; watching tv; visiting friends  PATIENT GOALS: to stay as independent as possible for as long as possible  OBJECTIVE:   HAND DOMINANCE: Right  ADLs: Overall ADLs: mod I  IADLs:  Light housekeeping: hired help every 2 weeks  Handwriting: 75% legible and worse at end of day  MOBILITY STATUS: Independent and uses cane occassionally  POSTURE COMMENTS:  rounded shoulders  ACTIVITY TOLERANCE: Activity tolerance: good  FUNCTIONAL OUTCOME MEASURES: Fastening/unfastening 3 buttons: 36 Physical performance test: PPT#2 (simulated eating) 12 seconds & PPT#4 (donning/doffing jacket): 16  COORDINATION: 9 Hole Peg test: Right: 44 sec; Left: 32 sec  UE ROM:  WFL  UE MMT:   [facility]  08/02/2022 - Grip: R - 26.6 lbs; L - 27.1 lbs  SENSATION: WFL  MUSCLE TONE: RUE: Mild and Rigidity and LUE: Mild and Rigidity  COGNITION: Overall cognitive status: Within functional limits for tasks assessed  OBSERVATIONS: Dyskinesias and essential tremor   TODAY'S TREATMENT:                                                                                                                               Reviewed posture ex w/ tactile cues provided  Pt issued bag HEP for neuro re-education to maintain flexibility with ADLS - see pt instructions for details. Pt required mod cueing to perform correctly.  Pt copying peg design with medium sized pegs Rt hand for [facility]  and visual/perceptual skills with min to mod cueing to correct errors  PATIENT EDUCATION: Education details: Bag HEP (3 ex's) Person educated: Patient Education method: Explanation, Demonstration, and Handouts Education comprehension: verbalized understanding and needs further education  HOME EXERCISE PROGRAM: 08/02/2022: PWR! Hands and prior HEPs 08/09/2022: Tremor compensatory strategies 08/11/22: writing strategies 08/16/22: Coordination HEP  08/18/22: Posture ex 08/23/22: 3 bag ex's   GOALS:  LONG TERM GOALS: Target date: 08/27/2022    Pt will be independent with HEP.  Baseline: not yet initiated Goal status: MET  2.  Pt will verbalize understanding of adapted strategies to maximize safety and independence with ADLs/IADLs.  Baseline: not yet initiated Goal status: MET  3.  Pt will demonstrate improved fine motor coordination for ADLs as evidenced by decreasing 9 hole peg test score for R hand by at least 10 secs  Baseline: 44 secs Goal status: INITIAL  5.  Pt will demonstrate improved ease with fastening buttons as evidenced by decreasing 3 button/unbutton time by at least 4 seconds  Baseline: 36 seconds (55 sec)  Goal status: IN PROGRESS  6.  Pt will write a short paragraph with no significant decrease in size and maintain 90% legibility.  Baseline: 75% legibility Goal status: MET  7. Pt will demonstrate increased ease with dressing as evidenced by decreasing PPT#4 (don/ doff jacket) to 13 secs or less.  Baseline: 16 secs Goal status: IN PROGRESS     ASSESSMENT:  CLINICAL IMPRESSION: Pt progressing towards goals - benefits from reinforcement and review of HEP's and education. Pt has met 3 LTG's at this time. Renewal completed today to extend for 1x/wk for up to 4 more weeks  PERFORMANCE DEFICITS: in functional skills including ADLs, IADLs, coordination, strength, Fine  motor control, mobility, and UE functional use  IMPAIRMENTS: are limiting patient from ADLs,  IADLs, and leisure.   COMORBIDITIES:  may have co-morbidities  that affects occupational performance. Patient will benefit from skilled OT to address above impairments and improve overall function.  REHAB POTENTIAL: Good  PLAN:  OT FREQUENCY: 1x/week  OT DURATION: 4 additional weeks  PLANNED INTERVENTIONS: self care/ADL training, therapeutic exercise, therapeutic activity, functional mobility training, patient/family education, coping strategies training, DME and/or AE instructions, and Re-evaluation  RECOMMENDED OTHER SERVICES: none at this time  CONSULTED AND AGREED WITH PLAN OF CARE: Patient  PLAN FOR NEXT SESSION: progress towards remaining goals, review bag ex's   Sheran Lawless, OT 08/23/2022, 1:52 PM

## 2022-08-23 NOTE — Patient Instructions (Addendum)
Bag Exercises:  Small trash bag or produce bag works best.  For all exercises, sit with big posture (sit up tall with head up) and use big movements. Perform the following exercises 1-2 times per day.  Hold bag in one hand. Stretch both arms/hands out to the side as big as you can. Then, pass bag from one hand to the other BEHIND you. Stretch arms back out big after each pass. Repeat 10 times.  Hold bag in both hands in front of you with hands/arms shoulder length apart. Move bag behind your head. Repeat 10 times.  Hold bag in both hands in front of you with hands/arms shoulder length apart. Lift leg and move bag completely under each foot and back. Repeat 10 times on each side.

## 2022-08-23 NOTE — Therapy (Signed)
OUTPATIENT PHYSICAL THERAPY NEURO TREATMENT   Patient Name: April Church MRN: [id number] DOB:[birth year], 82 y.o., female Today's Date: 08/23/2022   PCP: Lorenda Ishihara, MD REFERRING PROVIDER: Vladimir Faster, DO  END OF SESSION:  PT End of Session - 08/23/22 1236     Visit Number 7    Number of Visits 13   Plus eval   Date for PT Re-Evaluation 09/09/22    Authorization Type Medicare A & B    PT Start Time 1233    PT Stop Time 1314    PT Time Calculation (min) 41 min    Equipment Utilized During Treatment Gait belt    Activity Tolerance Patient tolerated treatment well    Behavior During Therapy WFL for tasks assessed/performed             Past Medical History:  Diagnosis Date   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 2011- no further tx. now- Dr. Donnie Coffin , Dr. Dayton Scrape   Cyst of finger 11/2011   annular cyst right long finger   Dental crowns present    Dermatitis    Frequency of urination    GERD (gastroesophageal reflux disease)    Hemorrhoid    History of colon polyps 2009   Also noted 2012 and 2017 by colonoscopy.   History of shingles 1971   Had right ischial recurrence in the March 2019   Hyperlipidemia    On atorvastatin   Hypertension    under control, has been on med. x 2 yrs.   Liposarcoma of left shoulder (HCC) 1969   Parkinson's disease    With mild tremor (neurologist Dr. Marjory Lies), neurosurgeon Dr. Lovell Sheehan   PONV (postoperative nausea and vomiting)    Seasonal allergies    Shingles of eyelid 1971   Right eyelid; recurrent -> last episode 05/11/2017   Tremors of nervous system    hands-essential tremor; associated with Parkinson's.   Trigger finger of right hand 11/2011   long finger   Past Surgical History:  Procedure Laterality Date   14-day Zio Patch Monitor  08/2020   (report to be  scanned): Predominant SR w/ HR 61 to 142 bpm and average 86 bpm.  Total of 59 episodes of PAT/PSVT  4 to 15 beats (not noted on diary).  Fastest was 5 beats at a rate of 193 bpm, longest was 15 beats at a rate of 106 bpm.  Rare isolated PACs (as well as couplets and triplets) with rare isolated PVCs.  No sustained arrhythmias or bradycardia to explain syncope.   ANTERIOR CERVICAL DECOMPRESSION/DISCECTOMY FUSION 4 LEVELS N/A 11/14/2013   Procedure: ANTERIOR CERVICAL DECOMPRESSION/DISCECTOMY FUSION 4 LEVELS;  Surgeon: Tressie Stalker, MD;  Location: [facility];  Service: Neurosurgery;  Laterality: N/A;  C34 C45 C56 C67 anterior cervical fusion with interbody prosthesis plating and  bonegraft   APPENDECTOMY  age 57   BREAST LUMPECTOMY  06/16/2009   left; SLN bx.   BREAST LUMPECTOMY  07/01/2009   re-excision   BREAST SURGERY  02/22/1997   reduction   CATARACT EXTRACTION, BILATERAL     COLONOSCOPY WITH PROPOFOL N/A 12/03/2014   Procedure: COLONOSCOPY WITH PROPOFOL;  Surgeon: Charolett Bumpers, MD;  Location: [facility];  Service: Endoscopy;  Laterality: N/A;   FOOT SURGERY  06/22/2011   left   KNEE ARTHROSCOPY  03/12/2005  right   KNEE ARTHROSCOPY     left   Lower Extremity Venous Dopplers  12/12/2020   No DVT bilaterally in the deep veins or superficial veins.  No deep or superficial venous reflux noted bilaterally with exception of Right SSV at the knee.   NASAL SINUS SURGERY     x 2   NEPHRECTOMY LIVING DONOR Left 04/22/2008   donated to spouse 2010([facility])   TRANSTHORACIC ECHOCARDIOGRAM  12/10/2020   EF 60 to 65%.  Normal LV size and function.  No heart WMA.  GR 1 DD.  Normal RV, RVP and RAP.[name]  Normal valves.   TRIGGER FINGER RELEASE  12/16/2011   Procedure: RELEASE TRIGGER FINGER/A-1 PULLEY;  Surgeon: Tami Ribas, MD;  Location: [facility];  Service: Orthopedics;  Laterality: Right;  RIGHT LONG FINGER TRIGGER RELEASE & ANNULAR CYST EXCISION   TUMOR EXCISION  age 73    right arm   Patient Active Problem List   Diagnosis Date Noted   Rectal bleeding 07/27/2022   Hemorrhoids 07/27/2022   Chronic pain of both knees 06/21/2022   Moderate persistent asthma without complication 03/27/2021   PSVT (paroxysmal supraventricular tachycardia) 11/26/2020   Syncope and collapse 11/24/2020   Primary osteoarthritis of right knee 05/12/2020   Primary osteoarthritis of left knee 05/12/2020   Hyperlipidemia    Bilateral calf pain 03/05/2019   Genetic testing 02/13/2019   Family history of ovarian cancer    Family history of bladder cancer    Family history of colon cancer    Family history of leukemia    Lumbar radiculopathy 12/08/2018   Myofascial pain 12/08/2018   Impaired gait and mobility 12/08/2018   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   Chronic nonallergic rhinitis 12/29/2016   Mild persistent asthma, uncomplicated 12/29/2016   Vocal fold paralysis, 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   Gastroesophageal reflux disease 12/29/2016   Cervical spondylosis with myelopathy and radiculopathy 11/14/2013   Essential tremor 06/13/2013   Cervical spondylosis without myelopathy 06/13/2013   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 Had to cancel visit on Wednesday  due to going to the beach. A little bit of pain in the knees, but not bad    Pt accompanied by: self  PERTINENT HISTORY: Acquired solitary  kidney (04/2008), Asthma, Breast cancer (HCC) (05/2009), Cyst of finger (11/2011), Dental crowns present, Dermatitis, Frequency of urination, GERD (gastroesophageal reflux disease), Hemorrhoid, History of colon polyps (2009), History of shingles (1971), Hyperlipidemia, Hypertension, Liposarcoma of left shoulder (HCC) (1969), Parkinson's disease, PONV (postoperative nausea and vomiting), Seasonal allergies, Shingles of eyelid (1971), Tremors of nervous system, and Trigger finger of right hand (11/2011). here with essential tremor since 1990's. Now with intermittent resting tremor AND bradykinesia since 2016. May represent essential tremor with superimposed parkinson's disease + cervical myelopathy sequelae  PAIN:  Are you having pain? Yes, pt reports some knee pain   PRECAUTIONS: Fall  WEIGHT BEARING RESTRICTIONS: No  FALLS: Has patient fallen in last 6 months? No  LIVING ENVIRONMENT: Lives with: lives alone Lives in: House/apartment Stairs: Yes: Internal: full flight steps; bilateral but cannot reach both and External: 3 steps; on right going up, on left going up, and can reach both Has following equipment at home: Single point cane and Walker - 4 wheeled  PLOF: Independent  PATIENT GOALS: "Being able to control my balance better"  OBJECTIVE:   COGNITION: Overall cognitive status: Within functional limits for tasks assessed   SENSATION: Denies numbness/tingling in BUE/BLEs  COORDINATION: Heel to shin test: WNL bilaterally    POSTURE: rounded shoulders, forward head, and increased thoracic kyphosis  LOWER EXTREMITY ROM:     Active  Right Eval Left Eval  Hip flexion    Hip extension    Hip abduction    Hip adduction    Hip internal rotation    Hip external rotation    Knee flexion    Knee extension    Ankle dorsiflexion    Ankle plantarflexion    Ankle inversion    Ankle eversion     (Blank rows = not tested)  LOWER EXTREMITY MMT:  Tested in seated position   MMT  Right Eval Left Eval  Hip flexion 4- 4  Hip extension    Hip abduction 4 4  Hip adduction 4 4  Hip internal rotation    Hip external rotation    Knee flexion 4- 4  Knee extension 4- 4  Ankle dorsiflexion 4 4  Ankle plantarflexion    Ankle inversion    Ankle eversion    (Blank rows = not tested)  BED MOBILITY:  Independent per pt   TRANSFERS: Assistive device utilized: None  Sit to stand: Modified independence Stand to sit: Modified independence    TODAY'S TREATMENT:             GAIT: Gait pattern: lateral gait deviations, step through pattern, decreased arm swing- Right, decreased arm swing- Left, decreased stride length, knee flexed in stance- Right, knee flexed in stance- Left, lateral hip instability, trunk flexed, and narrow BOS Distance walked: Various clinic distances  Assistive device utilized: [facility] Comments: Intermittent cues for sequencing and posture.   Therapeutic Activity: Gait speed: 23.3 seconds = 1.41 ft/sec with SPC 16.2 seconds = 2.0 ft/sec with no AD   Push and release: Anterior: 1 step Posterior: 1 step L lateral: Unable to ellicit a step R lateral: a couple steps, with pt having to grab onto bars for balance    [facility] PT Assessment - 08/23/22 1238       Functional Gait  Assessment   Gait assessed  Yes  Gait Level Surface Walks 20 ft, slow speed, abnormal gait pattern, evidence for imbalance or deviates 10-15 in outside of the 12 in walkway width. Requires more than 7 sec to ambulate 20 ft.   8 seconds   Change in Gait Speed Able to smoothly change walking speed without loss of balance or gait deviation. Deviate no more than 6 in outside of the 12 in walkway width.    Gait with Horizontal Head Turns Performs head turns smoothly with slight change in gait velocity (eg, minor disruption to smooth gait path), deviates 6-10 in outside 12 in walkway width, or uses an assistive device.    Gait with Vertical Head Turns Performs head turns with no  change in gait. Deviates no more than 6 in outside 12 in walkway width.    Gait and Pivot Turn Pivot turns safely within 3 sec and stops quickly with no loss of balance.    Step Over Obstacle Is able to step over one shoe box (4.5 in total height) without changing gait speed. No evidence of imbalance.    Gait with Narrow Base of Support Ambulates less than 4 steps heel to toe or cannot perform without assistance.    Gait with Eyes Closed Walks 20 ft, slow speed, abnormal gait pattern, evidence for imbalance, deviates 10-15 in outside 12 in walkway width. Requires more than 9 sec to ambulate 20 ft.   11.4 seconds   Ambulating Backwards Walks 20 ft, uses assistive device, slower speed, mild gait deviations, deviates 6-10 in outside 12 in walkway width.   17 seconds   Steps Alternating feet, must use rail.    Total Score 19             M-CTSIB  Condition 1: Firm Surface, EO 30 Sec, Normal Sway  Condition 2: Firm Surface, EC 30 Sec, Normal Sway  Condition 3: Foam Surface, EO 30 Sec, Mild Sway  Condition 4: Foam Surface, EC 13 Sec   STAIRS:           Level of Assistance: SBA           Stair Negotiation Technique: Step to Pattern Alternating Pattern  Forwards with Single Rail on Right           Number of Stairs: 4  X 3 reps = 12 steps total            Height of Stairs: 6     Comments: Pt ascending with alternating pattern and descending with step to pattern. Reminder cues to think of incr foot clearance when descending, and thinking about a big kick when descending to make sure that foot gets all the way on the step (vs. Just pt's toe on the step). Pt demonstrated understanding and educated that pt can always ascend with step to pattern to feel more steady   PATIENT EDUCATION: Education details: Results of goals and outcome measures, adding 1x week for 4 weeks, importance of performing HEP and using a compliant surface for balance for vestibular input. Showed pt where to purchase a balance  pad off [employer] for use for home as pt currently is not standing on something unlevel. Provided handout for fall prevention in the home  Person educated: Patient Education method: Explanation, Demonstration, and Handouts Education comprehension: verbalized understanding, returned demonstration, and needs further education  HOME EXERCISE PROGRAM: Access Code: [email or web address] URL: [email or web address] Date: 08/02/2022 Prepared by: Sherlie Ban  Exercises - Seated Hamstring Stretch  - 1 x daily - 5 x weekly -  1 sets - 3 reps - 30 sec hold - Sit to Stand Without Arm Support  - 1 x daily - 7 x weekly - 3 sets - 10 reps - Standing Balance with Eyes Closed on Foam  - 1 x daily - 5 x weekly - 3 sets - 30 hold - Romberg Stance on Foam Pad with Head Rotation  - 1 x daily - 5 x weekly - 2 sets - 10 reps - Walking with Head Rotation  - 1 x daily - 5 x weekly - 3 sets - Side to Side Weight Shift with Overhead Reach and Counter Support  - 1 x daily - 5 x weekly - 1 sets - 10 reps   GOALS: Goals reviewed with patient? Yes  SHORT TERM GOALS: Target date: 08/26/2022   Pt will be independent with initial HEP for improved strength, balance, transfers and gait.  Baseline: to be reviewed from previous bout of therapy Goal status: PARTIALLY MET  2.  Pt will improve FGA to at least a 17/30 in order to demo decr fall risk. Baseline: 14/30; 19/30 on 08/23/22 Goal status: MET  3.  Pt will improve condition 4 of mCTSIB to at least 8 seconds in order to demo improved vestibular input for balance.  Baseline: 3-4 seconds   13 seconds on 08/23/22 Goal status: MET  4.  Pt will trial various assistive devices in clinic to determine safest option for home use for fall prevention  Baseline: met - pt using cane  Goal status: MET  5.  Pt will improve gait velocity to at least 2.9 ft/s w/LRAD and SBA for improved gait efficiency and reduced fall risk   Baseline: 2.6 ft/s no AD  On 08/23/22: 23.3 seconds  = 1.41 ft/sec with SPC 16.2 seconds = 2.0 ft/sec  Goal status: NOT MET  6.  Pt will teach back and demonstrate fall prevention techniques to implement at home and in community for reduced fall risk  Baseline: pt using SPC, provided fall prevention handout   Goal status: MET   LONG TERM GOALS: Target date: 09/09/2022  Pt will be independent with final HEP for improved strength, balance, transfers and gait.  Baseline:  Goal status: INITIAL  2.  Pt will improve condition 4 of mCTSIB to at least 17 seconds in order to demo improved vestibular input for balance. Baseline: 3-4 seconds, 13 seconds  Goal status: REVISED  3.  Pt will improve FGA to at least a 20/30 in order to demo decr fall risk. Baseline: 14/30 Goal status: INITIAL  4.  Pt will improve gait velocity to at least 3.1 ft/s w/LRAD mod I for improved gait efficiency and independence  Baseline: 2.6 ft/s no AD Goal status: INITIAL  5.  Pt will improve 5 x STS to less than or equal to 14 w/o UE support seconds to demonstrate improved functional strength and transfer efficiency.   Baseline: 18.5s no UE support Goal status: INITIAL   ASSESSMENT:  CLINICAL IMPRESSION: Today's skilled session focused on assessing pt's STGs. Pt has met 2 out of 6 STGs. Pt improved FGA from a 14/30 > 19/30, decr pt's risk for falls. Pt also improved condition 4 of mCTSIB from 3 seconds > 13 seconds, indicating improved vestibular input for balance. Pt did not meet goal in regards to gait speed, pt's gait speed has slowed down with no AD and with use of SPC. However, pt is much more steady with use of cane. Pt reports that she has  been consistently using her cane at home. Pt will continue to benefit from skilled PT to address balance, gait, functional strength to decr fall risk. LTGs revised as appropriate. Will continue per POC.    OBJECTIVE IMPAIRMENTS: Abnormal gait, decreased activity tolerance, decreased balance, decreased coordination,  decreased endurance, decreased knowledge of use of DME, decreased mobility, difficulty walking, decreased ROM, decreased strength, improper body mechanics, postural dysfunction, and pain  ACTIVITY LIMITATIONS: carrying, lifting, bending, squatting, stairs, transfers, and locomotion level  PARTICIPATION LIMITATIONS: shopping, community activity, yard work, and church  PERSONAL FACTORS: Age, Fitness, and 1 comorbidity: essential tremor  are also affecting patient's functional outcome.   REHAB POTENTIAL: Good  CLINICAL DECISION MAKING: Evolving/moderate complexity  EVALUATION COMPLEXITY: Moderate  PLAN:  PT FREQUENCY: 1-2x/week  PT DURATION: 6 weeks  PLANNED INTERVENTIONS: Therapeutic exercises, Therapeutic activity, Neuromuscular re-education, Balance training, Gait training, Patient/Family education, Self Care, Joint mobilization, Stair training, Vestibular training, Canalith repositioning, DME instructions, Manual therapy, and Re-evaluation  PLAN FOR NEXT SESSION: continue working on gait with cane. Work on balance on compliant surfaces, retro gait, head motions, obstacles, SLS. Lateral stepping strategies    Drake Leach, PT, DPT 08/23/2022, 2:51 PM

## 2022-08-25 ENCOUNTER — Ambulatory Visit: Payer: Medicare Other | Admitting: Physical Therapy

## 2022-08-25 ENCOUNTER — Ambulatory Visit: Payer: Medicare Other | Admitting: Occupational Therapy

## 2022-08-27 NOTE — Progress Notes (Unsigned)
Cardiology Clinic Note   Patient Name: April Church Date of Encounter: 08/27/2022  Primary Care Provider:  Lorenda Ishihara, MD Primary Cardiologist:  April Lemma, MD  Patient Profile    82 year old female with history of hyperlipidemia, PSVT, hyperlipidemia, chronic syncope and collapse (possible orthostatic component) allowing for mild permissive hypertension.  On last office visit amlodipine was reduced to 2.5 mg daily and Toprol was ordered at 25 mg daily because of palpitations.  Last seen by Dr. Herbie Church on 07/14/2021.  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 2011- no further tx. now- Dr. Donnie Church , Dr. Dayton Church   Cyst of finger 11/2011   annular cyst right long finger   Dental crowns present    Dermatitis    Frequency of urination    GERD (gastroesophageal reflux disease)    Hemorrhoid    History of colon polyps 2009   Also noted 2012 and 2017 by colonoscopy.   History of shingles 1971   Had right ischial recurrence in the March 2019   Hyperlipidemia    On atorvastatin   Hypertension    under control, has been on med. x 2 yrs.   Liposarcoma of left shoulder (HCC) 1969   Parkinson's disease    With mild tremor (neurologist Dr. Marjory Church), neurosurgeon Dr. Lovell Church   PONV (postoperative nausea and vomiting)    Seasonal allergies    Shingles of eyelid 1971   Right eyelid; recurrent -> last episode 05/11/2017   Tremors of nervous system    hands-essential tremor; associated with Parkinson's.   Trigger finger of right hand 11/2011   long finger   Past Surgical History:  Procedure Laterality Date   14-day Zio Patch Monitor  08/2020   (report to be scanned): Predominant SR w/ HR 61 to 142 bpm and average 86 bpm.  Total of 59 episodes of PAT/PSVT  4 to 15  beats (not noted on diary).  Fastest was 5 beats at a rate of 193 bpm, longest was 15 beats at a rate of 106 bpm.  Rare isolated PACs (as well as couplets and triplets) with rare isolated PVCs.  No sustained arrhythmias or bradycardia to explain syncope.   ANTERIOR CERVICAL DECOMPRESSION/DISCECTOMY FUSION 4 LEVELS N/A 11/14/2013   Procedure: ANTERIOR CERVICAL DECOMPRESSION/DISCECTOMY FUSION 4 LEVELS;  Surgeon: April Stalker, MD;  Location: [facility];  Service: Neurosurgery;  Laterality: N/A;  C34 C45 C56 C67 anterior cervical fusion with interbody prosthesis plating and  bonegraft   APPENDECTOMY  age 47   BREAST LUMPECTOMY  06/16/2009   left; SLN bx.   BREAST LUMPECTOMY  07/01/2009   re-excision   BREAST SURGERY  02/22/1997   reduction   CATARACT EXTRACTION, BILATERAL     COLONOSCOPY WITH PROPOFOL N/A 12/03/2014   Procedure: COLONOSCOPY WITH PROPOFOL;  Surgeon: April Bumpers, MD;  Location: [facility];  Service: Endoscopy;  Laterality: N/A;   FOOT SURGERY  06/22/2011   left   KNEE ARTHROSCOPY  03/12/2005   right   KNEE ARTHROSCOPY     left   Lower Extremity Venous Dopplers  12/12/2020   No DVT bilaterally in the deep veins or superficial veins.  No deep or superficial venous reflux noted bilaterally with exception of Right SSV at the knee.  NASAL SINUS SURGERY     x 2   NEPHRECTOMY LIVING DONOR Left 04/22/2008   donated to spouse 2010([facility])   TRANSTHORACIC ECHOCARDIOGRAM  12/10/2020   EF 60 to 65%.  Normal LV size and function.  No heart WMA.  GR 1 DD.  Normal RV, RVP and RAP.[name]  Normal valves.   TRIGGER FINGER RELEASE  12/16/2011   Procedure: RELEASE TRIGGER FINGER/A-1 PULLEY;  Surgeon: April Ribas, MD;  Location:  SURGERY CENTER;  Service: Orthopedics;  Laterality: Right;  RIGHT LONG FINGER TRIGGER RELEASE & ANNULAR CYST EXCISION   TUMOR EXCISION  age 56   right arm    Allergies  Allergies  Allergen Reactions   Shellfish Allergy Shortness Of Breath    Duloxetine Itching and Rash   Sulfa Drugs Cross Reactors Anxiety and Rash   Lisinopril     Other reaction(s): cough Other reaction(s): cough Other reaction(s): cough   Lyrica [Pregabalin] Other (See Comments)    "made me drunk, couldn't function"   Other     Other reaction(s): hyper   Sulfa Antibiotics Other (See Comments)    Other reaction(s): hyper, break out in a rash Other reaction(s): hyper, break out in a rash   Codeine Anxiety    Other reaction(s): hyper Other reaction(s): hyper    History of Present Illness    Mrs. April Church returns today for ongoing assessment and management of hypertension, hyperlipidemia, (history of orthostatic hypotension allowing for permissive hypertension), with history of syncope and collapse.  Home Medications    Current Outpatient Medications  Medication Sig Dispense Refill   albuterol (VENTOLIN HFA) 108 (90 Base) MCG/ACT inhaler Inhale 1-2 puffs into the lungs every 6 (six) hours as needed for wheezing or shortness of breath. 18 g 1   amLODipine (NORVASC) 2.5 MG tablet Take one tablet each day. 90 tablet 3   atorvastatin (LIPITOR) 20 MG tablet Take 20 mg by mouth daily.     budesonide-formoterol (SYMBICORT) 160-4.5 MCG/ACT inhaler Inhale 2 puffs into the lungs as needed.     CALCIUM CARBONATE PO Take 1 tablet by mouth 2 (two) times daily.     carbidopa-levodopa (SINEMET IR) 25-100 MG tablet Take 2 tablets by mouth 3 (three) times daily. 540 tablet 4   Cholecalciferol (VITAMIN D) 1000 UNITS capsule Take 1,000 Units by mouth 2 (two) times daily.     cyclobenzaprine (FLEXERIL) 5 MG tablet TAKE 1 TO UP TO 2 TABLETS BY MOUTH ONCE DAILY AT BEDTIME AS NEEDED FOR MUSCLE SPASM 50 tablet 11   entacapone (COMTAN) 200 MG tablet Take 1 tablet (200 mg total) by mouth 3 (three) times daily. 90 tablet 12   EPINEPHrine 0.3 mg/0.3 mL IJ SOAJ injection Inject 0.3 mg into the muscle as needed. (Patient not taking: Reported on 07/27/2022) 2 each 1   famotidine (PEPCID)  20 MG tablet Take 1 tablet by mouth once daily 30 tablet 5   fluticasone (FLOVENT HFA) 110 MCG/ACT inhaler 1 puff     hydrocortisone (ANUSOL-HC) 2.5 % rectal cream Procto-Med HC 2.5 % topical cream perineal applicator  APPLY CREAM RECTALLY TO AFFECTED AREA TWICE DAILY FOR 7 DAYS 30 g 1   ipratropium (ATROVENT) 0.03 % nasal spray USE 2 SPRAY(S) IN EACH NOSTRIL TWICE DAILY 30 mL 5   ketotifen (ZADITOR) 0.025 % ophthalmic solution Apply 1 drop to eye daily.     levocetirizine (XYZAL) 5 MG tablet TAKE 1 TABLET BY MOUTH ONCE DAILY IN THE EVENING . APPOINTMENT REQUIRED FOR FUTURE REFILLS  30 tablet 0   metoprolol succinate (TOPROL-XL) 25 MG 24 hr tablet Take 1 tablet (25 mg total) by mouth at bedtime. 90 tablet 3   nystatin ointment (MYCOSTATIN) Apply 1 Application topically 2 (two) times daily.     Polyethyl Glycol-Propyl Glycol (SYSTANE OP) Place 1 drop into both eyes 2 (two) times daily.     primidone (MYSOLINE) 50 MG tablet Take 2 tablets (100 mg total) by mouth 3 (three) times daily. 540 tablet 4   traMADol (ULTRAM) 50 MG tablet TAKE 1 TABLET BY MOUTH EVERY 12 HOURS AS NEEDED 20 tablet 0   triamcinolone ointment (KENALOG) 0.1 % Apply 1 Application topically as needed.     venlafaxine (EFFEXOR) 75 MG tablet Take 1 tablet (75 mg total) by mouth daily. 90 tablet 1   No current facility-administered medications for this visit.     Family History    Family History  Problem Relation Age of Onset   Kidney disease Mother    Stroke Father    Stroke Sister        08/2015   Diabetes Brother    Heart disease Brother    Ovarian cancer Sister 93   Heart disease Paternal Grandmother    Heart disease Paternal Grandfather    Leukemia Other 15       brother's grandson   Bladder Cancer Brother 41   Colon cancer Niece        dx in late 55s   She indicated that her mother is deceased. She indicated that her father is deceased. She indicated that both of her sisters are deceased. She indicated that only  one of her three brothers is alive. She indicated that her maternal grandmother is deceased. She indicated that her maternal grandfather is deceased. She indicated that her paternal grandmother is deceased. She indicated that her paternal grandfather is deceased. She indicated that her maternal aunt is deceased. She indicated that her maternal uncle is deceased. She indicated that her other is deceased. She indicated that her niece is alive.  Social History    Social History   Socioeconomic History   Marital status: Widowed    Spouse name: [name]-David   Number of children: 1   Years of education: HS   Highest education level: Not on file  Occupational History   Occupation: Retired   Occupation: Housewife  Tobacco Use   Smoking status: Never   Smokeless tobacco: Never  Vaping Use   Vaping Use: Never used  Substance and Sexual Activity   Alcohol use: No    Alcohol/week: 0.0 standard drinks of alcohol   Drug use: No   Sexual activity: Not Currently  Other Topics Concern   Not on file  Social History Narrative   10/13/20    Recently widowed-lives alone, husband April Church) passed 04/26/2020    Patient has 1 daughter who lives in [place], [place]-> 1 grandson who lives in [place]   Patient has a HS education.    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    Patient is right handed.    Patient drinks caffeine occasionally.     Social Determinants of Health   Financial Resource Strain: Not on file  Food Insecurity: Not on file  Transportation Needs: Not on file  Physical Activity: Not on file  Stress: Not on file  Social Connections: Not on file  Intimate Partner Violence: Not on file     Review of Systems    General:  No chills, fever, night sweats or weight changes.  Cardiovascular:  No chest pain, dyspnea on exertion, edema, orthopnea, palpitations, paroxysmal nocturnal dyspnea. Dermatological: No rash, lesions/masses Respiratory: No cough,  dyspnea Urologic: No hematuria, dysuria Abdominal:   No nausea, vomiting, diarrhea, bright red blood per rectum, melena, or hematemesis Neurologic:  No visual changes, wkns, changes in mental status. All other systems reviewed and are otherwise negative except as noted above.       Physical Exam    VS:  There were no vitals taken for this visit. , BMI There is no height or weight on file to calculate BMI.     GEN: Well nourished, well developed, in no acute distress. HEENT: normal. Neck: Supple, no JVD, carotid bruits, or masses. Cardiac: RRR, no murmurs, rubs, or gallops. No clubbing, cyanosis, edema.  Radials/DP/PT 2+ and equal bilaterally.  Respiratory:  Respirations regular and unlabored, clear to auscultation bilaterally. GI: Soft, nontender, nondistended, BS + x 4. MS: no deformity or atrophy. Skin: warm and dry, no rash. Neuro:  Strength and sensation are intact. Psych: Normal affect.      Lab Results  Component Value Date   WBC 5.2 01/20/2015   HGB 12.0 01/20/2015   HCT 36.1 01/20/2015   MCV 88.4 01/20/2015   PLT 293 01/20/2015   Lab Results  Component Value Date   CREATININE 0.65 07/30/2020   BUN 11 07/30/2020   NA 142 07/30/2020   K 4.5 07/30/2020   CL 100 07/30/2020   CO2 25 07/30/2020   Lab Results  Component Value Date   ALT 13 01/20/2015   AST 17 01/20/2015   ALKPHOS 86 01/20/2015   BILITOT <0.30 01/20/2015   No results found for: "CHOL", "HDL", "LDLCALC", "LDLDIRECT", "TRIG", "CHOLHDL"  No results found for: "HGBA1C"   Review of Prior Studies    Echocardiogram 12/11/2022 1. Left ventricular ejection fraction, by estimation, is 60 to 65%. The  left ventricle has normal function. The left ventricle has no regional  wall motion abnormalities. Left ventricular diastolic parameters are  consistent with Grade I diastolic  dysfunction (impaired relaxation).   2. Right ventricular systolic function is normal. The right ventricular  size is normal.    3. The mitral valve is normal in structure. Trivial mitral valve  regurgitation. No evidence of mitral stenosis.   4. The aortic valve is normal in structure. Aortic valve regurgitation is  not visualized. No aortic stenosis is present.   5. The inferior vena cava is normal in size with greater than 50%  respiratory variability, suggesting right atrial pressure of 3 mmHg.    Assessment & Plan   1.  ***     {Are you ordering a CV Procedure (e.g. stress test, cath, DCCV, TEE, etc)?   Press F2        :[id number]}   Signed, Bettey Mare. Liborio Nixon, ANP, AACC   08/27/2022 5:20 PM      Office [phone] Fax [phone]  Notice: This dictation was prepared with Dragon dictation along with smaller phrase technology. Any transcriptional errors that result from this process are unintentional and may not be corrected upon review.

## 2022-09-01 ENCOUNTER — Encounter: Payer: Self-pay | Admitting: Adult Health

## 2022-09-01 ENCOUNTER — Ambulatory Visit: Payer: Medicare Other | Attending: Adult Health | Admitting: Adult Health

## 2022-09-01 VITALS — BP 132/78 | HR 83 | Ht 60.0 in | Wt 126.0 lb

## 2022-09-01 DIAGNOSIS — I471 Supraventricular tachycardia, unspecified: Secondary | ICD-10-CM | POA: Diagnosis not present

## 2022-09-01 MED ORDER — METOPROLOL SUCCINATE ER 25 MG PO TB24: 12.5000 mg | ORAL_TABLET | Freq: Every day | ORAL | 5 refills | Status: DC

## 2022-09-01 NOTE — Patient Instructions (Signed)
Medication Instructions:  Hold Amlodipine. Increase Metoprolol from 25 mg to 37.5 mg Daily ( Take 1.5  25 mg Tablet ). *If you need a refill on your cardiac medications before your next appointment, please call your pharmacy*   Lab Work: No Labs If you have labs (blood work) drawn today and your tests are completely normal, you will receive your results only by: MyChart Message (if you have MyChart) OR A paper copy in the mail If you have any lab test that is abnormal or we need to change your treatment, we will call you to review the results.   Testing/Procedures: No Testing   Follow-Up: At [facility], you and your health needs are our priority.  As part of our continuing mission to provide you with exceptional heart care, we have created designated Provider Care Teams.  These Care Teams include your primary Cardiologist (physician) and Advanced Practice Providers (APPs -  Physician Assistants and Nurse Practitioners) who all work together to provide you with the care you need, when you need it.  We recommend signing up for the patient portal called "MyChart".  Sign up information is provided on this After Visit Summary.  MyChart is used to connect with patients for Virtual Visits (Telemedicine).  Patients are able to view lab/test results, encounter notes, upcoming appointments, etc.  Non-urgent messages can be sent to your provider as well.   To learn more about what you can do with MyChart, go to [email or web address].    Your next appointment:   2 week(s)  Provider:   Joni Reining, DNP, ANP

## 2022-09-08 ENCOUNTER — Ambulatory Visit: Payer: Medicare Other | Admitting: Occupational Therapy

## 2022-09-08 ENCOUNTER — Ambulatory Visit: Payer: Medicare Other | Admitting: Physical Therapy

## 2022-09-08 DIAGNOSIS — R279 Unspecified lack of coordination: Secondary | ICD-10-CM | POA: Diagnosis not present

## 2022-09-08 DIAGNOSIS — R293 Abnormal posture: Secondary | ICD-10-CM

## 2022-09-08 DIAGNOSIS — R2681 Unsteadiness on feet: Secondary | ICD-10-CM | POA: Diagnosis not present

## 2022-09-08 DIAGNOSIS — R251 Tremor, unspecified: Secondary | ICD-10-CM

## 2022-09-08 DIAGNOSIS — R278 Other lack of coordination: Secondary | ICD-10-CM

## 2022-09-08 DIAGNOSIS — R2689 Other abnormalities of gait and mobility: Secondary | ICD-10-CM

## 2022-09-08 NOTE — Patient Instructions (Signed)
"  Pen Tricks" Bigger object = easier  Rotation- Twirl pen like a baton 10-15 x one direction, then switch directions   Shift- Hold pen like a dart at the tip and shift the pen ("inch worm") until holding at the base of the pen. Shift back to the tip of the pen and repeat 10-15x   Flip- With hand down on the table, hold pen in writing position, then flip it to an erase position. Flip it back to writing position and repeat 10-15x   Translation- With your palm up to the sky, put an object in your palm. Without tipping your hand, use your fingers and thumb to move the object to the tips of each of your fingers, one-at-a-time.  Repeat 5-10x each finger.

## 2022-09-08 NOTE — Therapy (Signed)
OUTPATIENT OCCUPATIONAL THERAPY TREATMENT  Patient Name: April Church MRN: [id number] DOB:12-29-1940, 82 y.o., female Today's Date: 09/08/2022  PCP: Lorenda Ishihara, MD  REFERRING PROVIDER: Vladimir Faster, DO   END OF SESSION:  OT End of Session - 09/08/22 1408     Visit Number 8    Number of Visits 11    Date for OT Re-Evaluation 09/27/22    Authorization Type Medicare    OT Start Time 1406    OT Stop Time 1444    OT Time Calculation (min) 38 min    Activity Tolerance Patient tolerated treatment well    Behavior During Therapy 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 2011- no further tx. now- Dr. Donnie Coffin , Dr. Dayton Scrape   Cyst of finger 11/2011   annular cyst right long finger   Dental crowns present    Dermatitis    Frequency of urination    GERD (gastroesophageal reflux disease)    Hemorrhoid    History of colon polyps 2009   Also noted 2012 and 2017 by colonoscopy.   History of shingles 1971   Had right ischial recurrence in the March 2019   Hyperlipidemia    On atorvastatin   Hypertension    under control, has been on med. x 2 yrs.   Liposarcoma of left shoulder (HCC) 1969   Parkinson's disease    With mild tremor (neurologist Dr. Marjory Lies), neurosurgeon Dr. Lovell Sheehan   PONV (postoperative nausea and vomiting)    Seasonal allergies    Shingles of eyelid 1971   Right eyelid; recurrent -> last episode 05/11/2017   Tremors of nervous system    hands-essential tremor; associated with Parkinson's.   Trigger finger of right hand 11/2011   long finger   Past Surgical History:  Procedure Laterality Date   14-day Zio Patch Monitor  08/2020   (report to be scanned): Predominant SR w/ HR 61 to 142 bpm and average 86 bpm.   Total of 59 episodes of PAT/PSVT  4 to 15 beats (not noted on diary).  Fastest was 5 beats at a rate of 193 bpm, longest was 15 beats at a rate of 106 bpm.  Rare isolated PACs (as well as couplets and triplets) with rare isolated PVCs.  No sustained arrhythmias or bradycardia to explain syncope.   ANTERIOR CERVICAL DECOMPRESSION/DISCECTOMY FUSION 4 LEVELS N/A 11/14/2013   Procedure: ANTERIOR CERVICAL DECOMPRESSION/DISCECTOMY FUSION 4 LEVELS;  Surgeon: Tressie Stalker, MD;  Location: [facility];  Service: Neurosurgery;  Laterality: N/A;  C34 C45 C56 C67 anterior cervical fusion with interbody prosthesis plating and  bonegraft   APPENDECTOMY  age 48   BREAST LUMPECTOMY  06/16/2009   left; SLN bx.   BREAST LUMPECTOMY  07/01/2009   re-excision   BREAST SURGERY  02/22/1997   reduction   CATARACT EXTRACTION, BILATERAL     COLONOSCOPY WITH PROPOFOL N/A 12/03/2014   Procedure: COLONOSCOPY WITH PROPOFOL;  Surgeon: Charolett Bumpers, MD;  Location: [facility];  Service: Endoscopy;  Laterality: N/A;   FOOT SURGERY  06/22/2011   left   KNEE ARTHROSCOPY  03/12/2005   right   KNEE ARTHROSCOPY     left   Lower Extremity Venous  Dopplers  12/12/2020   No DVT bilaterally in the deep veins or superficial veins.  No deep or superficial venous reflux noted bilaterally with exception of Right SSV at the knee.   NASAL SINUS SURGERY     x 2   NEPHRECTOMY LIVING DONOR Left 04/22/2008   donated to spouse 2010([facility])   TRANSTHORACIC ECHOCARDIOGRAM  12/10/2020   EF 60 to 65%.  Normal LV size and function.  No heart WMA.  GR 1 DD.  Normal RV, RVP and RAP.[name]  Normal valves.   TRIGGER FINGER RELEASE  12/16/2011   Procedure: RELEASE TRIGGER FINGER/A-1 PULLEY;  Surgeon: Tami Ribas, MD;  Location: [facility];  Service: Orthopedics;  Laterality: Right;  RIGHT LONG FINGER TRIGGER RELEASE & ANNULAR CYST EXCISION   TUMOR EXCISION  age 41   right arm   Patient Active Problem List   Diagnosis Date  Noted   Rectal bleeding 07/27/2022   Hemorrhoids 07/27/2022   Chronic pain of both knees 06/21/2022   Moderate persistent asthma without complication 03/27/2021   PSVT (paroxysmal supraventricular tachycardia) 11/26/2020   Syncope and collapse 11/24/2020   Primary osteoarthritis of right knee 05/12/2020   Primary osteoarthritis of left knee 05/12/2020   Hyperlipidemia    Bilateral calf pain 03/05/2019   Genetic testing 02/13/2019   Family history of ovarian cancer    Family history of bladder cancer    Family history of colon cancer    Family history of leukemia    Lumbar radiculopathy 12/08/2018   Myofascial pain 12/08/2018   Impaired gait and mobility 12/08/2018   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   Chronic nonallergic rhinitis 12/29/2016   Mild persistent asthma, uncomplicated 12/29/2016   Vocal fold paralysis, 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   Gastroesophageal reflux disease 12/29/2016   Cervical spondylosis with myelopathy and radiculopathy 11/14/2013   Essential tremor 06/13/2013   Cervical spondylosis without myelopathy 06/13/2013   Breast cancer of lower-outer quadrant of left female breast (HCC) 09/10/2010    ONSET DATE: 07/22/2022 (Date of referral)  REFERRING DIAG:  R26.81 (ICD-10-CM) - Gait instability  R25.1 (ICD-10-CM) - Tremor  R27.9 (ICD-10-CM) - Incoordination    THERAPY DIAG:  Other lack of coordination  Tremor  Incoordination  Rationale for Evaluation and Treatment: Rehabilitation  SUBJECTIVE:   SUBJECTIVE STATEMENT: She has been completing bag exercises behind head at home since her last visit.   Pt accompanied by: self  PERTINENT HISTORY: Hx of essential tremor and previously diagnosed with Parkinson's; however, she is to undergo further testing to see if it confirms this diagnosis. Pt referred to OP OT following Parkinson's screen due to concerns with UE coordination.   PRECAUTIONS: Fall  WEIGHT BEARING RESTRICTIONS: No  PAIN:  Are you having  pain? Yes: NPRS scale: 3-5/10 Pain location: B knees  FALLS: Has patient fallen in last 6 months? No  LIVING ENVIRONMENT: Lives with: lives with their family and lives alone Lives in: House/apartment Stairs: Yes: Internal: 12-13 steps; can reach both and External: 3 steps; can reach both; she is able to live on main floor Has following equipment at home: Single point cane, Walker - 4 wheeled, shower chair, and Grab bars  PLOF: Independent; driving; retired Therapist, nutritional and Research officer, political party; reading; watching tv; visiting friends  PATIENT GOALS: to stay as independent as possible for as long as possible  OBJECTIVE:   HAND DOMINANCE: Right  ADLs: Overall ADLs: mod I  IADLs: Light housekeeping: hired help every 2 weeks  Handwriting: 75% legible and worse  at end of day  MOBILITY STATUS: Independent and uses cane occassionally  POSTURE COMMENTS:  rounded shoulders  ACTIVITY TOLERANCE: Activity tolerance: good  FUNCTIONAL OUTCOME MEASURES: Fastening/unfastening 3 buttons: 36 Physical performance test: PPT#2 (simulated eating) 12 seconds & PPT#4 (donning/doffing jacket): 16  COORDINATION: 9 Hole Peg test: Right: 44 sec; Left: 32 sec  UE ROM:  WFL  UE MMT:   [facility]  08/02/2022 - Grip: R - 26.6 lbs; L - 27.1 lbs  SENSATION: WFL  MUSCLE TONE: RUE: Mild and Rigidity and LUE: Mild and Rigidity  COGNITION: Overall cognitive status: Within functional limits for tasks assessed  OBSERVATIONS: Dyskinesias and essential tremor   TODAY'S TREATMENT:                                                                                                                            Reviewed bag HEP as noted in pt instructions for details. Pt required mod cueing to perform correctly.  Pt completed pen tricks as noted in pt instructions for improved coordination as needed for writing.   PATIENT EDUCATION: Education details: Lobbyist Person educated: Patient Education method:  Explanation, Demonstration, and Handouts Education comprehension: verbalized understanding and needs further education  HOME EXERCISE PROGRAM: 08/02/2022: PWR! Hands and prior HEPs 08/09/2022: Tremor compensatory strategies 08/11/22: writing strategies 08/16/22: Coordination HEP  08/18/22: Posture ex 08/23/22: 3 bag ex's 09/08/2022: Pen tricks  GOALS:  LONG TERM GOALS: Target date: 08/27/2022    Pt will be independent with HEP.  Baseline: not yet initiated Goal status: MET  2.  Pt will verbalize understanding of adapted strategies to maximize safety and independence with ADLs/IADLs.  Baseline: not yet initiated Goal status: MET  3.  Pt will demonstrate improved fine motor coordination for ADLs as evidenced by decreasing 9 hole peg test score for R hand by at least 10 secs  Baseline: 44 secs Goal status: INITIAL  5.  Pt will demonstrate improved ease with fastening buttons as evidenced by decreasing 3 button/unbutton time by at least 4 seconds  Baseline: 36 seconds (55 sec)  Goal status: IN PROGRESS  6.  Pt will write a short paragraph with no significant decrease in size and maintain 90% legibility.  Baseline: 75% legibility Goal status: MET  7. Pt will demonstrate increased ease with dressing as evidenced by decreasing PPT#4 (don/ doff jacket) to 13 secs or less.  Baseline: 16 secs Goal status: IN PROGRESS     ASSESSMENT:  CLINICAL IMPRESSION: Pt demonstrates fair recall of HEP. Could benefit from further review including review of newly initiated HEP for coordination in efforts to improve writing.   PERFORMANCE DEFICITS: in functional skills including ADLs, IADLs, coordination, strength, Fine motor control, mobility, and UE functional use  IMPAIRMENTS: are limiting patient from ADLs, IADLs, and leisure.   COMORBIDITIES:  may have co-morbidities  that affects occupational performance. Patient will benefit from skilled OT to address above impairments and improve overall  function.  REHAB  POTENTIAL: Good  PLAN:  OT FREQUENCY: 1x/week  OT DURATION: 4 additional weeks  PLANNED INTERVENTIONS: self care/ADL training, therapeutic exercise, therapeutic activity, functional mobility training, patient/family education, coping strategies training, DME and/or AE instructions, and Re-evaluation  RECOMMENDED OTHER SERVICES: none at this time  CONSULTED AND AGREED WITH PLAN OF CARE: Patient  PLAN FOR NEXT SESSION: progress towards remaining goals, review bag exs and pen tricks   Delana Meyer, OT 09/08/2022, 5:07 PM

## 2022-09-08 NOTE — Therapy (Signed)
OUTPATIENT PHYSICAL THERAPY NEURO TREATMENT- RECERTIFICATION   Patient Name: April Church MRN: [id number] DOB:Sep 18, 1940, 82 y.o., female Today's Date: 09/08/2022   PCP: Lorenda Ishihara, MD REFERRING PROVIDER: Vladimir Faster, DO  END OF SESSION:  PT End of Session - 09/08/22 1452     Visit Number 8    Number of Visits 13   Plus eval   Date for PT Re-Evaluation 10/06/22   Recert   Authorization Type Medicare A & B    PT Start Time 1450    PT Stop Time 1528    PT Time Calculation (min) 38 min    Equipment Utilized During Treatment Gait belt    Activity Tolerance Patient tolerated treatment well    Behavior During Therapy WFL for tasks assessed/performed              Past Medical History:  Diagnosis Date   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 2011- no further tx. now- Dr. Donnie Coffin , Dr. Dayton Scrape   Cyst of finger 11/2011   annular cyst right long finger   Dental crowns present    Dermatitis    Frequency of urination    GERD (gastroesophageal reflux disease)    Hemorrhoid    History of colon polyps 2009   Also noted 2012 and 2017 by colonoscopy.   History of shingles 1971   Had right ischial recurrence in the March 2019   Hyperlipidemia    On atorvastatin   Hypertension    under control, has been on med. x 2 yrs.   Liposarcoma of left shoulder (HCC) 1969   Parkinson's disease    With mild tremor (neurologist Dr. Marjory Lies), neurosurgeon Dr. Lovell Sheehan   PONV (postoperative nausea and vomiting)    Seasonal allergies    Shingles of eyelid 1971   Right eyelid; recurrent -> last episode 05/11/2017   Tremors of nervous system    hands-essential tremor; associated with Parkinson's.   Trigger finger of right hand 11/2011   long finger   Past Surgical History:  Procedure Laterality Date   14-day Zio Patch  Monitor  08/2020   (report to be scanned): Predominant SR w/ HR 61 to 142 bpm and average 86 bpm.  Total of 59 episodes of PAT/PSVT  4 to 15 beats (not noted on diary).  Fastest was 5 beats at a rate of 193 bpm, longest was 15 beats at a rate of 106 bpm.  Rare isolated PACs (as well as couplets and triplets) with rare isolated PVCs.  No sustained arrhythmias or bradycardia to explain syncope.   ANTERIOR CERVICAL DECOMPRESSION/DISCECTOMY FUSION 4 LEVELS N/A 11/14/2013   Procedure: ANTERIOR CERVICAL DECOMPRESSION/DISCECTOMY FUSION 4 LEVELS;  Surgeon: Tressie Stalker, MD;  Location: [facility];  Service: Neurosurgery;  Laterality: N/A;  C34 C45 C56 C67 anterior cervical fusion with interbody prosthesis plating and  bonegraft   APPENDECTOMY  age 61   BREAST LUMPECTOMY  06/16/2009   left; SLN bx.   BREAST LUMPECTOMY  07/01/2009   re-excision   BREAST SURGERY  02/22/1997   reduction   CATARACT EXTRACTION, BILATERAL     COLONOSCOPY WITH PROPOFOL N/A 12/03/2014   Procedure: COLONOSCOPY WITH PROPOFOL;  Surgeon: Charolett Bumpers, MD;  Location: [facility];  Service: Endoscopy;  Laterality: N/A;   FOOT SURGERY  06/22/2011   left   KNEE  ARTHROSCOPY  03/12/2005   right   KNEE ARTHROSCOPY     left   Lower Extremity Venous Dopplers  12/12/2020   No DVT bilaterally in the deep veins or superficial veins.  No deep or superficial venous reflux noted bilaterally with exception of Right SSV at the knee.   NASAL SINUS SURGERY     x 2   NEPHRECTOMY LIVING DONOR Left 04/22/2008   donated to spouse 2010([facility])   TRANSTHORACIC ECHOCARDIOGRAM  12/10/2020   EF 60 to 65%.  Normal LV size and function.  No heart WMA.  GR 1 DD.  Normal RV, RVP and RAP.[name]  Normal valves.   TRIGGER FINGER RELEASE  12/16/2011   Procedure: RELEASE TRIGGER FINGER/A-1 PULLEY;  Surgeon: Tami Ribas, MD;  Location: [facility];  Service: Orthopedics;  Laterality: Right;  RIGHT LONG FINGER TRIGGER RELEASE & ANNULAR CYST  EXCISION   TUMOR EXCISION  age 18   right arm   Patient Active Problem List   Diagnosis Date Noted   Rectal bleeding 07/27/2022   Hemorrhoids 07/27/2022   Chronic pain of both knees 06/21/2022   Moderate persistent asthma without complication 03/27/2021   PSVT (paroxysmal supraventricular tachycardia) 11/26/2020   Syncope and collapse 11/24/2020   Primary osteoarthritis of right knee 05/12/2020   Primary osteoarthritis of left knee 05/12/2020   Hyperlipidemia    Bilateral calf pain 03/05/2019   Genetic testing 02/13/2019   Family history of ovarian cancer    Family history of bladder cancer    Family history of colon cancer    Family history of leukemia    Lumbar radiculopathy 12/08/2018   Myofascial pain 12/08/2018   Impaired gait and mobility 12/08/2018   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   Chronic nonallergic rhinitis 12/29/2016   Mild persistent asthma, uncomplicated 12/29/2016   Vocal fold paralysis, 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   Gastroesophageal reflux disease 12/29/2016   Cervical spondylosis with myelopathy and radiculopathy 11/14/2013   Essential tremor 06/13/2013   Cervical spondylosis without myelopathy 06/13/2013   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 Having  more pain in her knees today, rating as 5/10. Goes to see pain management Monday, but does not feel as though the injections are  beneficial.    Pt accompanied by: self  PERTINENT HISTORY: Acquired solitary kidney (04/2008), Asthma, Breast cancer (HCC) (05/2009), Cyst of finger (11/2011), Dental crowns present, Dermatitis, Frequency of urination, GERD (gastroesophageal reflux disease), Hemorrhoid, History of colon polyps (2009), History of shingles (1971), Hyperlipidemia, Hypertension, Liposarcoma of left shoulder (HCC) (1969), Parkinson's disease, PONV (postoperative nausea and vomiting), Seasonal allergies, Shingles of eyelid (1971), Tremors of nervous system, and Trigger finger of right hand (11/2011). here with essential tremor since 1990's. Now with intermittent resting tremor AND bradykinesia since 2016. May represent essential tremor with superimposed parkinson's disease + cervical myelopathy sequelae  PAIN:  Are you having pain? Yes, pt reports some knee pain   PRECAUTIONS: Fall  WEIGHT BEARING RESTRICTIONS: No  FALLS: Has patient fallen in last 6 months? No  LIVING ENVIRONMENT: Lives with: lives alone Lives in: House/apartment Stairs: Yes: Internal: full flight steps; bilateral but cannot reach both and External: 3 steps; on right going up, on left going up, and can reach both Has following equipment at home: Single point cane and Walker - 4 wheeled  PLOF: Independent  PATIENT GOALS: "Being able to control my balance better"  OBJECTIVE:   COGNITION: Overall cognitive status: Within functional limits for tasks assessed   SENSATION: Denies numbness/tingling in BUE/BLEs  COORDINATION: Heel to shin test: WNL bilaterally    POSTURE: rounded shoulders, forward head, and increased thoracic kyphosis  LOWER EXTREMITY ROM:     Active  Right Eval Left Eval  Hip flexion    Hip extension    Hip abduction    Hip adduction    Hip internal rotation    Hip external rotation    Knee flexion    Knee extension    Ankle dorsiflexion    Ankle plantarflexion    Ankle inversion    Ankle eversion      (Blank rows = not tested)  LOWER EXTREMITY MMT:  Tested in seated position   MMT Right Eval Left Eval  Hip flexion 4- 4  Hip extension    Hip abduction 4 4  Hip adduction 4 4  Hip internal rotation    Hip external rotation    Knee flexion 4- 4  Knee extension 4- 4  Ankle dorsiflexion 4 4  Ankle plantarflexion    Ankle inversion    Ankle eversion    (Blank rows = not tested)  BED MOBILITY:  Independent per pt   TRANSFERS: Assistive device utilized: None  Sit to stand: Modified independence Stand to sit: Modified independence    TODAY'S TREATMENT:             GAIT: Gait pattern: lateral gait deviations, step through pattern, decreased arm swing- Right, decreased arm swing- Left, decreased stride length, knee flexed in stance- Right, knee flexed in stance- Left, lateral hip instability, trunk flexed, and narrow BOS Distance walked: Various clinic distances  Assistive device utilized: [facility] Comments: Intermittent cues for sequencing and posture.   Therapeutic Activity: LTG Assessment   [facility] PT Assessment - 09/08/22 1459       Functional Gait  Assessment   Gait assessed  Yes    Gait Level Surface Walks 20 ft, slow speed, abnormal gait pattern, evidence for imbalance or deviates 10-15 in outside of the 12 in walkway width. Requires more than 7 sec to ambulate 20 ft.   8.28s  Change in Gait Speed Able to smoothly change walking speed without loss of balance or gait deviation. Deviate no more than 6 in outside of the 12 in walkway width.    Gait with Horizontal Head Turns Performs head turns smoothly with no change in gait. Deviates no more than 6 in outside 12 in walkway width    Gait with Vertical Head Turns Performs head turns with no change in gait. Deviates no more than 6 in outside 12 in walkway width.    Gait and Pivot Turn Pivot turns safely within 3 sec and stops quickly with no loss of balance.    Step Over Obstacle Is able to step over one shoe box (4.5 in total  height) without changing gait speed. No evidence of imbalance.    Gait with Narrow Base of Support Ambulates 4-7 steps.    Gait with Eyes Closed Walks 20 ft, slow speed, abnormal gait pattern, evidence for imbalance, deviates 10-15 in outside 12 in walkway width. Requires more than 9 sec to ambulate 20 ft.   Significant R path deviation   Ambulating Backwards Walks 20 ft, slow speed, abnormal gait pattern, evidence for imbalance, deviates 10-15 in outside 12 in walkway width.   23.91s   Steps Alternating feet, must use rail.    Total Score 20    FGA comment: medium fall risk            NMR  In // bars for improved step clearance/length, single leg stance and anticipatory balance strategies: On airex, alt adv retreat over 4" foam beam, x12 per side. Pt initially requiring BUE support but was able to perform w/LUE support and CGA until fatigue. W/fatigue, pt frequently knocking beam over w/BLEs and losing balance to L side, requiring min A Progressed to lateral alt adv. Retreat over 4" foam beam w/BUE support, x10 per side. Pt frequently knocking beam over w/RLE  On airex in front of rebounder, attempted 4# med ball throw/catches but pt unable to throw ball well. Regressed to 2KG soft ball, x20 reps, for improved anticipatory and reactive balance strategies and visual scanning. Min a throughout due to LOB to L side. Pt maintained mini squat position throughout. Single instance of mod A required due to posterolateral LOB to L side.   PATIENT EDUCATION: Education details: FGA results, plan for recertification  Person educated: Patient Education method: Explanation Education comprehension: verbalized understanding  HOME EXERCISE PROGRAM: Access Code: [email or web address] URL: [email or web address] Date: 08/02/2022 Prepared by: Sherlie Ban  Exercises - Seated Hamstring Stretch  - 1 x daily - 5 x weekly - 1 sets - 3 reps - 30 sec hold - Sit to Stand Without Arm Support  - 1 x daily -  7 x weekly - 3 sets - 10 reps - Standing Balance with Eyes Closed on Foam  - 1 x daily - 5 x weekly - 3 sets - 30 hold - Romberg Stance on Foam Pad with Head Rotation  - 1 x daily - 5 x weekly - 2 sets - 10 reps - Walking with Head Rotation  - 1 x daily - 5 x weekly - 3 sets - Side to Side Weight Shift with Overhead Reach and Counter Support  - 1 x daily - 5 x weekly - 1 sets - 10 reps   GOALS: Goals reviewed with patient? Yes  SHORT TERM GOALS: Target date: 08/26/2022   Pt will be independent with initial HEP for improved strength, balance, transfers and gait.  Baseline: to  be reviewed from previous bout of therapy Goal status: PARTIALLY MET  2.  Pt will improve FGA to at least a 17/30 in order to demo decr fall risk. Baseline: 14/30; 19/30 on 08/23/22 Goal status: MET  3.  Pt will improve condition 4 of mCTSIB to at least 8 seconds in order to demo improved vestibular input for balance.  Baseline: 3-4 seconds   13 seconds on 08/23/22 Goal status: MET  4.  Pt will trial various assistive devices in clinic to determine safest option for home use for fall prevention  Baseline: met - pt using cane  Goal status: MET  5.  Pt will improve gait velocity to at least 2.9 ft/s w/LRAD and SBA for improved gait efficiency and reduced fall risk   Baseline: 2.6 ft/s no AD  On 08/23/22: 23.3 seconds = 1.41 ft/sec with SPC 16.2 seconds = 2.0 ft/sec  Goal status: NOT MET  6.  Pt will teach back and demonstrate fall prevention techniques to implement at home and in community for reduced fall risk  Baseline: pt using SPC, provided fall prevention handout   Goal status: MET   LONG TERM GOALS: Target date: 09/29/2022 (updated to match cert date)     Pt will be independent with final HEP for improved strength, balance, transfers and gait.  Baseline:  Goal status: INITIAL  2.  Pt will improve condition 4 of mCTSIB to at least 17 seconds in order to demo improved vestibular input for  balance. Baseline: 3-4 seconds, 13 seconds  Goal status: REVISED  3.  Pt will improve FGA to at least a 20/30 in order to demo decr fall risk. Baseline: 14/30; 20/30 (7/17) Goal status: MET  4.  Pt will improve gait velocity to at least 3.1 ft/s w/LRAD mod I for improved gait efficiency and independence  Baseline: 2.6 ft/s no AD Goal status: INITIAL  5.  Pt will improve 5 x STS to less than or equal to 14 w/o UE support seconds to demonstrate improved functional strength and transfer efficiency.   Baseline: 18.5s no UE support Goal status: INITIAL   ASSESSMENT:  CLINICAL IMPRESSION: Emphasis of skilled PT session on balance assessment, anticipatory and reactive balance strategies and BLE strength. Pt scored a 20/30 on FGA, indicative of medium fall risk. Pt continues to be most challenged by gait w/EC, retro gait and stepping over obstacles. Pt in agreement to recert for 1x/week for 4 more weeks to continue working on balance deficits and reduce fall risk. Continue POC.    OBJECTIVE IMPAIRMENTS: Abnormal gait, decreased activity tolerance, decreased balance, decreased coordination, decreased endurance, decreased knowledge of use of DME, decreased mobility, difficulty walking, decreased ROM, decreased strength, improper body mechanics, postural dysfunction, and pain  ACTIVITY LIMITATIONS: carrying, lifting, bending, squatting, stairs, transfers, and locomotion level  PARTICIPATION LIMITATIONS: shopping, community activity, yard work, and church  PERSONAL FACTORS: Age, Fitness, and 1 comorbidity: essential tremor  are also affecting patient's functional outcome.   REHAB POTENTIAL: Good  CLINICAL DECISION MAKING: Evolving/moderate complexity  EVALUATION COMPLEXITY: Moderate  PLAN:  PT FREQUENCY: 1-2x/week  PT DURATION: 6 weeks + 4 weeks (recert)   PLANNED INTERVENTIONS: Therapeutic exercises, Therapeutic activity, Neuromuscular re-education, Balance training, Gait training,  Patient/Family education, Self Care, Joint mobilization, Stair training, Vestibular training, Canalith repositioning, DME instructions, Manual therapy, and Re-evaluation  PLAN FOR NEXT SESSION: continue working on gait with cane. Work on balance on compliant surfaces, retro gait, head motions, obstacles, SLS. Lateral stepping strategies  Jill Alexanders Lyriq Finerty, PT, DPT 09/08/2022, 3:29 PM

## 2022-09-13 ENCOUNTER — Other Ambulatory Visit (INDEPENDENT_AMBULATORY_CARE_PROVIDER_SITE_OTHER): Payer: Medicare Other

## 2022-09-13 ENCOUNTER — Encounter: Payer: Self-pay | Admitting: Internal Medicine

## 2022-09-13 ENCOUNTER — Ambulatory Visit (INDEPENDENT_AMBULATORY_CARE_PROVIDER_SITE_OTHER): Payer: Medicare Other | Admitting: Internal Medicine

## 2022-09-13 VITALS — BP 124/80 | HR 78 | Ht 60.0 in | Wt 124.0 lb

## 2022-09-13 DIAGNOSIS — K649 Unspecified hemorrhoids: Secondary | ICD-10-CM

## 2022-09-13 DIAGNOSIS — K625 Hemorrhage of anus and rectum: Secondary | ICD-10-CM

## 2022-09-13 LAB — CBC
HCT: 39.7 % (ref 36.0–46.0)
Hemoglobin: 12.9 g/dL (ref 12.0–15.0)
MCHC: 32.6 g/dL (ref 30.0–36.0)
MCV: 93 fl (ref 78.0–100.0)
Platelets: 316 10*3/uL (ref 150.0–400.0)
RBC: 4.27 Mil/uL (ref 3.87–5.11)
RDW: 12.2 % (ref 11.5–15.5)
WBC: 5.6 10*3/uL (ref 4.0–10.5)

## 2022-09-13 LAB — IBC + FERRITIN
Ferritin: 13.3 ng/mL (ref 10.0–291.0)
Iron: 71 ug/dL (ref 42–145)
Saturation Ratios: 19.2 % — ABNORMAL LOW (ref 20.0–50.0)
TIBC: 369.6 ug/dL (ref 250.0–450.0)
Transferrin: 264 mg/dL (ref 212.0–360.0)

## 2022-09-13 MED ORDER — NA SULFATE-K SULFATE-MG SULF 17.5-3.13-1.6 GM/177ML PO SOLN: ORAL | 0 refills | Status: AC

## 2022-09-13 NOTE — Patient Instructions (Addendum)
Your provider has requested that you go to the basement level for lab work before leaving today. Press "B" on the elevator. The lab is located at the first door on the left as you exit the elevator.   You have been scheduled for a colonoscopy. Please follow written instructions given to you at your visit today.   Please pick up your prep supplies at the pharmacy within the next 1-3 days.  If you use inhalers (even only as needed), please bring them with you on the day of your procedure.  DO NOT TAKE 7 DAYS PRIOR TO TEST- Trulicity (dulaglutide) Ozempic, Wegovy (semaglutide) Mounjaro (tirzepatide) Bydureon Bcise (exanatide extended release)  DO NOT TAKE 1 DAY PRIOR TO YOUR TEST Rybelsus (semaglutide) Adlyxin (lixisenatide) Victoza (liraglutide) Byetta (exanatide)  We have sent the following medications to your pharmacy for you to pick up at your convenience: Suprep   HEMORRHOID BANDING PROCEDURE    FOLLOW-UP CARE   The procedure you have had should have been relatively painless since the banding of the area involved does not have nerve endings and there is no pain sensation.  The rubber band cuts off the blood supply to the hemorrhoid and the band may fall off as soon as 48 hours after the banding (the band may occasionally be seen in the toilet bowl following a bowel movement). You may notice a temporary feeling of fullness in the rectum which should respond adequately to plain Tylenol or Motrin.  Following the banding, avoid strenuous exercise that evening and resume full activity the next day.  A sitz bath (soaking in a warm tub) or bidet is soothing, and can be useful for cleansing the area after bowel movements.     To avoid constipation, take two tablespoons of natural wheat bran, natural oat bran, flax, Benefiber or any over the counter fiber supplement and increase your water intake to 7-8 glasses daily.    Unless you have been prescribed anorectal medication, do not put  anything inside your rectum for two weeks: No suppositories, enemas, fingers, etc.  Occasionally, you may have more bleeding than usual after the banding procedure.  This is often from the untreated hemorrhoids rather than the treated one.  Don't be concerned if there is a tablespoon or so of blood.  If there is more blood than this, lie flat with your bottom higher than your head and apply an ice pack to the area. If the bleeding does not stop within a half an hour or if you feel faint, call our office at [phone] or go to the emergency room.  Problems are not common; however, if there is a substantial amount of bleeding, severe pain, chills, fever or difficulty passing urine (very rare) or other problems, you should call us at [phone] or report to the nearest emergency room.  Do not stay seated continuously for more than 2-3 hours for a day or two after the procedure.  Tighten your buttock muscles 10-15 times every two hours and take 10-15 deep breaths every 1-2 hours.  Do not spend more than a few minutes on the toilet if you cannot empty your bowel; instead re-visit the toilet at a later time.   _______________________________________________________  If your blood pressure at your visit was 140/90 or greater, please contact your primary care physician to follow up on this.  _______________________________________________________  If you are age 83 or older, your body mass index should be between 23-30. Your Body mass index is 24.22  kg/m. If this is out of the aforementioned range listed, please consider follow up with your Primary Care Provider.  If you are age 83 or younger, your body mass index should be between 19-25. Your Body mass index is 24.22 kg/m. If this is out of the aformentioned range listed, please consider follow up with your Primary Care Provider.   ________________________________________________________  The [facility] providers would like to encourage you  to use [facility] to communicate with providers for non-urgent requests or questions.  Due to long hold times on the telephone, sending your provider a message by [facility] may be a faster and more efficient way to get a response.  Please allow 48 business hours for a response.  Please remember that this is for non-urgent requests.  _______________________________________________________  Due to recent changes in healthcare laws, you may see the results of your imaging and laboratory studies on MyChart before your provider has had a chance to review them.  We understand that in some cases there may be results that are confusing or concerning to you. Not all laboratory results come back in the same time frame and the provider may be waiting for multiple results in order to interpret others.  Please give [place] 48 hours in order for your provider to thoroughly review all the results before contacting the office for clarification of your results.   Thank you for entrusting me with your care and for choosing [facility], Dr. Eulah Pont

## 2022-09-13 NOTE — Progress Notes (Signed)
Chief Complaint: Rectal bleeding  HPI : 82 year old female with history of asthma, breast cancer in remission, GERD, Parkinson disease presents for follow up of rectal bleeding  Interval History: She has still been having rectal bleeding. The bleeding is bright red blood. Anusol cream was not effective for controlling the bleeding. Denies rectal pain. Endorses rectal itching. She denies any constipation issues.  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 2011- no further tx. now- Dr. Donnie Coffin , Dr. Dayton Scrape   Cyst of finger 11/2011   annular cyst right long finger   Dental crowns present    Dermatitis    Frequency of urination    GERD (gastroesophageal reflux disease)    Hemorrhoid    History of colon polyps 2009   Also noted 2012 and 2017 by colonoscopy.   History of shingles 1971   Had right ischial recurrence in the March 2019   Hyperlipidemia    On atorvastatin   Hypertension    under control, has been on med. x 2 yrs.   Liposarcoma of left shoulder (HCC) 1969   Parkinson's disease    With mild tremor (neurologist Dr. Marjory Lies), neurosurgeon Dr. Lovell Sheehan   PONV (postoperative nausea and vomiting)    Seasonal allergies    Shingles of eyelid 1971   Right eyelid; recurrent -> last episode 05/11/2017   Tremors of nervous system    hands-essential tremor; associated with Parkinson's.   Trigger finger of right hand 11/2011   long finger     Past Surgical History:  Procedure Laterality Date   14-day Zio Patch Monitor  08/2020   (report to be scanned): Predominant SR w/ HR 61 to 142 bpm and average 86 bpm.  Total of 59 episodes of PAT/PSVT  4 to 15 beats (not noted on  diary).  Fastest was 5 beats at a rate of 193 bpm, longest was 15 beats at a rate of 106 bpm.  Rare isolated PACs (as well as couplets and triplets) with rare isolated PVCs.  No sustained arrhythmias or bradycardia to explain syncope.   ANTERIOR CERVICAL DECOMPRESSION/DISCECTOMY FUSION 4 LEVELS N/A 11/14/2013   Procedure: ANTERIOR CERVICAL DECOMPRESSION/DISCECTOMY FUSION 4 LEVELS;  Surgeon: Tressie Stalker, MD;  Location: [facility];  Service: Neurosurgery;  Laterality: N/A;  C34 C45 C56 C67 anterior cervical fusion with interbody prosthesis plating and  bonegraft   APPENDECTOMY  age 32   BREAST LUMPECTOMY  06/16/2009   left; SLN bx.   BREAST LUMPECTOMY  07/01/2009   re-excision   BREAST SURGERY  02/22/1997   reduction   CATARACT EXTRACTION, BILATERAL     COLONOSCOPY WITH PROPOFOL N/A 12/03/2014   Procedure: COLONOSCOPY WITH PROPOFOL;  Surgeon: Charolett Bumpers, MD;  Location: [facility];  Service: Endoscopy;  Laterality: N/A;   FOOT SURGERY  06/22/2011   left   KNEE ARTHROSCOPY  03/12/2005   right   KNEE ARTHROSCOPY     left   Lower Extremity Venous Dopplers  12/12/2020   No DVT bilaterally in the deep veins or superficial veins.  No deep or superficial venous reflux noted bilaterally with exception of Right  SSV at the knee.   NASAL SINUS SURGERY     x 2   NEPHRECTOMY LIVING DONOR Left 04/22/2008   donated to spouse 2010([facility])   TRANSTHORACIC ECHOCARDIOGRAM  12/10/2020   EF 60 to 65%.  Normal LV size and function.  No heart WMA.  GR 1 DD.  Normal RV, RVP and RAP.[name]  Normal valves.   TRIGGER FINGER RELEASE  12/16/2011   Procedure: RELEASE TRIGGER FINGER/A-1 PULLEY;  Surgeon: Tami Ribas, MD;  Location: [facility];  Service: Orthopedics;  Laterality: Right;  RIGHT LONG FINGER TRIGGER RELEASE & ANNULAR CYST EXCISION   TUMOR EXCISION  age 31   right arm   Family History  Problem Relation Age of Onset   Kidney disease Mother    Stroke Father    Stroke Sister         08/2015   Diabetes Brother    Heart disease Brother    Ovarian cancer Sister 47   Heart disease Paternal Grandmother    Heart disease Paternal Grandfather    Leukemia Other 15       brother's grandson   Bladder Cancer Brother 71   Colon cancer Niece        dx in late 59s   Social History   Tobacco Use   Smoking status: Never   Smokeless tobacco: Never  Vaping Use   Vaping status: Never Used  Substance Use Topics   Alcohol use: No    Alcohol/week: 0.0 standard drinks of alcohol   Drug use: No   Current Outpatient Medications  Medication Sig Dispense Refill   albuterol (VENTOLIN HFA) 108 (90 Base) MCG/ACT inhaler Inhale 1-2 puffs into the lungs every 6 (six) hours as needed for wheezing or shortness of breath. 18 g 1   amLODipine (NORVASC) 2.5 MG tablet Take one tablet each day. 90 tablet 3   atorvastatin (LIPITOR) 20 MG tablet Take 20 mg by mouth daily.     budesonide-formoterol (SYMBICORT) 160-4.5 MCG/ACT inhaler Inhale 2 puffs into the lungs as needed.     CALCIUM CARBONATE PO Take 1 tablet by mouth 2 (two) times daily.     carbidopa-levodopa (SINEMET IR) 25-100 MG tablet Take 2 tablets by mouth 3 (three) times daily. 540 tablet 4   Cholecalciferol (VITAMIN D) 1000 UNITS capsule Take 1,000 Units by mouth 2 (two) times daily.     cyclobenzaprine (FLEXERIL) 5 MG tablet TAKE 1 TO UP TO 2 TABLETS BY MOUTH ONCE DAILY AT BEDTIME AS NEEDED FOR MUSCLE SPASM 50 tablet 11   entacapone (COMTAN) 200 MG tablet Take 1 tablet (200 mg total) by mouth 3 (three) times daily. 90 tablet 12   EPINEPHrine 0.3 mg/0.3 mL IJ SOAJ injection Inject 0.3 mg into the muscle as needed. 2 each 1   famotidine (PEPCID) 20 MG tablet Take 1 tablet by mouth once daily 30 tablet 5   fluticasone (FLOVENT HFA) 110 MCG/ACT inhaler 1 puff     hydrocortisone (ANUSOL-HC) 2.5 % rectal cream Procto-Med HC 2.5 % topical cream perineal applicator  APPLY CREAM RECTALLY TO AFFECTED AREA TWICE DAILY FOR 7 DAYS 30 g 1    ipratropium (ATROVENT) 0.03 % nasal spray USE 2 SPRAY(S) IN EACH NOSTRIL TWICE DAILY 30 mL 5   ketotifen (ZADITOR) 0.025 % ophthalmic solution Apply 1 drop to eye daily.     levocetirizine (XYZAL) 5 MG tablet TAKE 1 TABLET BY MOUTH ONCE DAILY IN THE EVENING . APPOINTMENT REQUIRED FOR FUTURE REFILLS 30 tablet  0   metoprolol succinate (TOPROL XL) 25 MG 24 hr tablet Take 0.5 tablets (12.5 mg total) by mouth daily. 30 tablet 5   metoprolol succinate (TOPROL-XL) 25 MG 24 hr tablet Take 1 tablet (25 mg total) by mouth at bedtime. 90 tablet 3   nystatin ointment (MYCOSTATIN) Apply 1 Application topically 2 (two) times daily.     Polyethyl Glycol-Propyl Glycol (SYSTANE OP) Place 1 drop into both eyes 2 (two) times daily.     primidone (MYSOLINE) 50 MG tablet Take 2 tablets (100 mg total) by mouth 3 (three) times daily. 540 tablet 4   traMADol (ULTRAM) 50 MG tablet TAKE 1 TABLET BY MOUTH EVERY 12 HOURS AS NEEDED 20 tablet 0   triamcinolone ointment (KENALOG) 0.1 % Apply 1 Application topically as needed.     venlafaxine (EFFEXOR) 75 MG tablet Take 1 tablet (75 mg total) by mouth daily. 90 tablet 1   No current facility-administered medications for this visit.   Allergies  Allergen Reactions   Shellfish Allergy Shortness Of Breath   Duloxetine Itching and Rash   Sulfa Drugs Cross Reactors Anxiety and Rash   Lisinopril     Other reaction(s): cough Other reaction(s): cough Other reaction(s): cough   Lyrica [Pregabalin] Other (See Comments)    "made me drunk, couldn't function"   Other     Other reaction(s): hyper   Sulfa Antibiotics Other (See Comments)    Other reaction(s): hyper, break out in a rash Other reaction(s): hyper, break out in a rash   Codeine Anxiety    Other reaction(s): hyper Other reaction(s): hyper     Review of Systems: All systems reviewed and negative except where noted in HPI.   Physical Exam: BP 124/80   Pulse 78   Ht 5' (1.524 m)   Wt 124 lb (56.2 kg)   BMI  24.22 kg/m  Constitutional: Pleasant,well-developed, female in no acute distress. HEENT: Normocephalic and atraumatic. Conjunctivae are normal. No scleral icterus. Cardiovascular: Normal rate, regular rhythm.  Pulmonary/chest: Effort normal and breath sounds normal. No wheezing, rales or rhonchi. Abdominal: Soft, nondistended, nontender. Bowel sounds active throughout. There are no masses palpable. No hepatomegaly. Rectal: Grade 2 internal hemorrhoids. Rectal vault Extremities: No edema Neurological: Alert and oriented to person place and time. Skin: Skin is warm and dry. No rashes noted. Psychiatric: Normal mood and affect. Behavior is normal.  Labs 07/30/20: BMP nml.   MR Pelvis w/contrast 10/06/20: IMPRESSION: Multiple small uterine fibroids, largest measuring 2.2 cm. No evidence of pedunculated or intracavitary fibroids. Nonvisualization of ovaries, however no adnexal mass identified. Right-sided sacral insufficiency fracture  05/11/2007 colonoscopy performed with removal of a 4 mm cecal adenomatous polyp.   10/27/2010 colonoscopy performed with removal of a 4 mm ascending colon tubular adenomatous polyp.   Colonoscopy 12/03/14: Assessment: Normal surveillance colonoscopy   ASSESSMENT AND PLAN: Rectal bleeding Hemorrhoids History of colon polyps Patient presents with rectal bleeding that has persisted over time. She has used Anusol HC cream but has not noticed a significant improvement in her rectal bleeding. However, on exam it does look like her hemorrhoids are not as irritated as they were in the past. I discussed the idea of a colonoscopy with the patient, and she is agreeable to proceeding in order to rule out colon cancer. Will also plan to check her blood counts to make sure that her Hb hasn't dropped significantly. Will proceed with hemorrhoidal banding today to see if this helps with her bleeding.  - Check CBC and  ferritin/IBC - Colonoscopy [facility]  Eulah Pont,  MD  PROCEDURE NOTE: The patient presents with symptomatic grade 2 hemorrhoids, requesting rubber band ligation of his/her hemorrhoidal disease.  All risks, benefits and alternative forms of therapy were described and informed consent was obtained.  In the Left Lateral Decubitus position anoscopic examination revealed grade 2 hemorrhoids in the posterior position(s).  The anorectum was pre-medicated with nitroglycerin and Recticare The decision was made to band the posterior internal hemorrhoid, and the [facility] System was used to perform band ligation without complication.  Digital anorectal examination was then performed to assure proper positioning of the band, and to adjust the banded tissue as required.  The patient was discharged home without pain or other issues.  Dietary and behavioral recommendations were given and along with follow-up instructions.     The following adjunctive treatments were recommended: Maintain regular bowel movements  No complications were encountered and the patient tolerated the procedure well.  I spent 32 minutes of time, including in depth chart review, independent review of results as outlined above, communicating results with the patient directly, face-to-face time with the patient, coordinating care, ordering studies and medications as appropriate, and documentation.

## 2022-09-14 NOTE — Progress Notes (Signed)
Cardiology Clinic Note   Patient Name: April Church Date of Encounter: 09/20/2022  Primary Care Provider:  Lorenda Ishihara, MD Primary Cardiologist:  April Lemma, MD  Patient Profile    82 year old female with history of PSVT, hyperlipidemia, chronic syncope with collapse with possible orthostatic component, (allowing for mild permissive hypertension).  On last office visit dated 09/01/2022 it was noted that she was being worked up for [employer] disease and being followed by neurology.  She was also undergoing physical rehabilitation, and continued to notice some transient dizziness but no near syncope or collapse.  She was using a cane for ambulation and fall prevention.  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 2011- no further tx. now- Dr. Donnie Church , Dr. Dayton Church   Cyst of finger 11/2011   annular cyst right long finger   Dental crowns present    Dermatitis    Frequency of urination    GERD (gastroesophageal reflux disease)    Hemorrhoid    History of colon polyps 2009   Also noted 2012 and 2017 by colonoscopy.   History of shingles 1971   Had right ischial recurrence in the March 2019   Hyperlipidemia    On atorvastatin   Hypertension    under control, has been on med. x 2 yrs.   Liposarcoma of left shoulder (HCC) 1969   Parkinson's disease    With mild tremor (neurologist Dr. Marjory Church), neurosurgeon Dr. Lovell Church   PONV (postoperative nausea and vomiting)    Seasonal allergies    Shingles of eyelid 1971   Right eyelid; recurrent -> last episode 05/11/2017   Tremors of nervous system    hands-essential tremor; associated with Parkinson's.   Trigger finger of right hand 11/2011   long finger   Past Surgical History:  Procedure Laterality Date    14-day Zio Patch Monitor  08/2020   (report to be scanned): Predominant SR w/ HR 61 to 142 bpm and average 86 bpm.  Total of 59 episodes of PAT/PSVT  4 to 15 beats (not noted on diary).  Fastest was 5 beats at a rate of 193 bpm, longest was 15 beats at a rate of 106 bpm.  Rare isolated PACs (as well as couplets and triplets) with rare isolated PVCs.  No sustained arrhythmias or bradycardia to explain syncope.   ANTERIOR CERVICAL DECOMPRESSION/DISCECTOMY FUSION 4 LEVELS N/A 11/14/2013   Procedure: ANTERIOR CERVICAL DECOMPRESSION/DISCECTOMY FUSION 4 LEVELS;  Surgeon: April Stalker, MD;  Location: [facility];  Service: Neurosurgery;  Laterality: N/A;  C34 C45 C56 C67 anterior cervical fusion with interbody prosthesis plating and  bonegraft   APPENDECTOMY  age 14   BREAST LUMPECTOMY  06/16/2009   left; SLN bx.   BREAST LUMPECTOMY  07/01/2009   re-excision   BREAST SURGERY  02/22/1997   reduction   CATARACT EXTRACTION, BILATERAL     COLONOSCOPY WITH PROPOFOL N/A 12/03/2014   Procedure: COLONOSCOPY WITH PROPOFOL;  Surgeon: April Bumpers, MD;  Location: [facility];  Service: Endoscopy;  Laterality: N/A;   FOOT SURGERY  06/22/2011   left   KNEE ARTHROSCOPY  03/12/2005   right   KNEE ARTHROSCOPY     left   Lower Extremity Venous Dopplers  12/12/2020   No DVT bilaterally in the  deep veins or superficial veins.  No deep or superficial venous reflux noted bilaterally with exception of Right SSV at the knee.   NASAL SINUS SURGERY     x 2   NEPHRECTOMY LIVING DONOR Left 04/22/2008   donated to spouse 2010([facility])   TRANSTHORACIC ECHOCARDIOGRAM  12/10/2020   EF 60 to 65%.  Normal LV size and function.  No heart WMA.  GR 1 DD.  Normal RV, RVP and RAP.[name]  Normal valves.   TRIGGER FINGER RELEASE  12/16/2011   Procedure: RELEASE TRIGGER FINGER/A-1 PULLEY;  Surgeon: April Ribas, MD;  Location:  SUR[facility]ERY C[facility];  Service: Orthopedics;  Laterality: Right;  RIGHT LONG FINGER TRIGGER RELEASE  & ANNULAR CYST EXCISION   TUMOR EXCISION  age 40   right arm    Allergies  Allergies  Allergen Reactions   Shellfish Allergy Shortness Of Breath   Duloxetine Itching and Rash   Sulfa Drugs Cross Reactors Anxiety and Rash   Lisinopril     Other reaction(s): cough Other reaction(s): cough Other reaction(s): cough   Lyrica [Pregabalin] Other (See Comments)    "made me drunk, couldn't function"   Other     Other reaction(s): hyper   Sulfa Antibiotics Other (See Comments)    Other reaction(s): hyper, break out in a rash Other reaction(s): hyper, break out in a rash   Codeine Anxiety    Other reaction(s): hyper Other reaction(s): hyper    History of Present Illness    April Church comes today for ongoing assessment and management of hypertension, chronic dizziness with prior history of syncope and collapse, PSVT, and hyperlipidemia.  Recently found to have some mild rectal bleeding and was last seen by GI on 09/08/2022 with planned colonoscopy  She complains of significant headache and left-sided facial pain taking metoprolol 12.5 mg at at bedtime.  She denies any recurrent PSVT symptoms.  She has been ruled out for Parkinson's by new neurologist April Church.  She is very pleased with this information.   Home Medications    Current Outpatient Medications  Medication Sig Dispense Refill   albuterol (VENTOLIN HFA) 108 (90 Base) MCG/ACT inhaler Inhale 1-2 puffs into the lungs every 6 (six) hours as needed for wheezing or shortness of breath. 18 g 1   atorvastatin (LIPITOR) 20 MG tablet Take 20 mg by mouth daily.     budesonide-formoterol (SYMBICORT) 160-4.5 MCG/ACT inhaler Inhale 2 puffs into the lungs as needed.     CALCIUM CARBONATE PO Take 1 tablet by mouth 2 (two) times daily.     carbidopa-levodopa (SINEMET IR) 25-100 MG tablet Take 2 tablets by mouth 3 (three) times daily. 540 tablet 4   Cholecalciferol (VITAMIN D) 1000 UNITS capsule Take 1,000 Units by mouth 2 (two) times  daily.     cyclobenzaprine (FLEXERIL) 5 MG tablet TAKE 1 TO UP TO 2 TABLETS BY MOUTH ONCE DAILY AT BEDTIME AS NEEDED FOR MUSCLE SPASM 50 tablet 11   entacapone (COMTAN) 200 MG tablet Take 1 tablet (200 mg total) by mouth 3 (three) times daily. 90 tablet 12   EPINEPHrine 0.3 mg/0.3 mL IJ SOAJ injection Inject 0.3 mg into the muscle as needed. 2 each 1   famotidine (PEPCID) 20 MG tablet Take 1 tablet by mouth once daily 30 tablet 5   fluticasone (FLOVENT HFA) 110 MCG/ACT inhaler 1 puff     hydrocortisone (ANUSOL-HC) 2.5 % rectal cream Procto-Med HC 2.5 % topical cream perineal applicator  APPLY CREAM RECTALLY TO AFFECTED  AREA TWICE DAILY FOR 7 DAYS 30 g 1   ipratropium (ATROVENT) 0.03 % nasal spray USE 2 SPRAY(S) IN EACH NOSTRIL TWICE DAILY 30 mL 5   ketotifen (ZADITOR) 0.025 % ophthalmic solution Apply 1 drop to eye daily.     levocetirizine (XYZAL) 5 MG tablet TAKE 1 TABLET BY MOUTH ONCE DAILY IN THE EVENING . APPOINTMENT REQUIRED FOR FUTURE REFILLS 30 tablet 0   metoprolol succinate (TOPROL-XL) 25 MG 24 hr tablet Take 12.5 mg by mouth daily. Take 1/2 Tablet In The Morning     Na Sulfate-K Sulfate-Mg Sulf 17.5-3.13-1.6 GM/177ML SOLN Use as directed; may use generic; goodrx card if insurance will not cover generic 9912 each 0   nystatin ointment (MYCOSTATIN) Apply 1 Application topically 2 (two) times daily.     Polyethyl Glycol-Propyl Glycol (SYSTANE OP) Place 1 drop into both eyes 2 (two) times daily.     primidone (MYSOLINE) 50 MG tablet Take 2 tablets (100 mg total) by mouth 3 (three) times daily. 540 tablet 4   traMADol (ULTRAM) 50 MG tablet TAKE 1 TABLET BY MOUTH EVERY 12 HOURS AS NEEDED 20 tablet 0   triamcinolone ointment (KENALOG) 0.1 % Apply 1 Application topically as needed.     venlafaxine (EFFEXOR) 75 MG tablet Take 1 tablet (75 mg total) by mouth daily. 90 tablet 1   amLODipine (NORVASC) 2.5 MG tablet Take one tablet each day. (Patient not taking: Reported on 09/20/2022) 90 tablet 3    metoprolol succinate (TOPROL XL) 25 MG 24 hr tablet Take 0.5 tablets (12.5 mg total) by mouth daily. (Patient not taking: Reported on 09/20/2022) 30 tablet 5   metoprolol succinate (TOPROL-XL) 25 MG 24 hr tablet Take 1 tablet (25 mg total) by mouth at bedtime. (Patient not taking: Reported on 09/20/2022) 90 tablet 3   No current facility-administered medications for this visit.     Family History    Family History  Problem Relation Age of Onset   Kidney disease Mother    Stroke Father    Stroke Sister        08/2015   Diabetes Brother    Heart disease Brother    Ovarian cancer Sister 39   Heart disease Paternal Grandmother    Heart disease Paternal Grandfather    Leukemia Other 15       brother's grandson   Bladder Cancer Brother 58   Colon cancer Niece        dx in late 91s   She indicated that her mother is deceased. She indicated that her father is deceased. She indicated that both of her sisters are deceased. She indicated that only one of her three brothers is alive. She indicated that her maternal grandmother is deceased. She indicated that her maternal grandfather is deceased. She indicated that her paternal grandmother is deceased. She indicated that her paternal grandfather is deceased. She indicated that her maternal aunt is deceased. She indicated that her maternal uncle is deceased. She indicated that her other is deceased. She indicated that her niece is alive.  Social History    Social History   Socioeconomic History   Marital status: Widowed    Spouse name: [name]-David   Number of children: 1   Years of education: HS   Highest education level: Not on file  Occupational History   Occupation: Retired   Occupation: Housewife  Tobacco Use   Smoking status: Never   Smokeless tobacco: Never  Vaping Use   Vaping status: Never Used  Substance and Sexual Activity   Alcohol use: No    Alcohol/week: 0.0 standard drinks of alcohol   Drug use: No   Sexual activity:  Not Currently  Other Topics Concern   Not on file  Social History Narrative   10/13/20    Recently widowed-lives alone, husband April Church) passed 04/26/2020    Patient has 1 daughter who lives in [place], [place]-> 1 grandson who lives in [place]   Patient has a HS education.    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    Patient is right handed.    Patient drinks caffeine occasionally.     Social Determinants of Health   Financial Resource Strain: Not on file  Food Insecurity: Not on file  Transportation Needs: Not on file  Physical Activity: Not on file  Stress: Not on file  Social Connections: Not on file  Intimate Partner Violence: Not on file     Review of Systems    General:  No chills, fever, night sweats or weight changes.  Cardiovascular:  No chest pain, dyspnea on exertion, edema, orthopnea, palpitations, paroxysmal nocturnal dyspnea. Dermatological: No rash, lesions/masses Respiratory: No cough, dyspnea Urologic: No hematuria, dysuria Abdominal:   No nausea, vomiting, diarrhea, bright red blood per rectum, melena, or hematemesis Neurologic:  No visual changes, wkns, changes in mental status. All other systems reviewed and are otherwise negative except as noted above.       Physical Exam    VS:  BP 120/86 (BP Location: Left Arm, Patient Position: Sitting, Cuff Size: Normal)   Pulse 71   Ht 5' (1.524 m)   Wt 125 lb 6.4 oz (56.9 kg)   SpO2 97%   BMI 24.49 kg/m  , BMI Body mass index is 24.49 kg/m.     GEN: Well nourished, well developed, in no acute distress. HEENT: normal. Neck: Supple, no JVD, carotid bruits, or masses. Cardiac: RRR, no murmurs, rubs, or gallops. No clubbing, cyanosis, edema.  Radials/DP/PT 2+ and equal bilaterally.  Respiratory:  Respirations regular and unlabored, clear to auscultation bilaterally. GI: Soft, nontender, nondistended, BS + x 4. MS: no deformity or atrophy. Skin: warm and dry, no rash. Neuro:   Strength and sensation are intact. Psych: Normal affect.      Lab Results  Component Value Date   WBC 5.6 09/13/2022   HGB 12.9 09/13/2022   HCT 39.7 09/13/2022   MCV 93.0 09/13/2022   PLT 316.0 09/13/2022   Lab Results  Component Value Date   CREATININE 0.65 07/30/2020   BUN 11 07/30/2020   NA 142 07/30/2020   K 4.5 07/30/2020   CL 100 07/30/2020   CO2 25 07/30/2020   Lab Results  Component Value Date   ALT 13 01/20/2015   AST 17 01/20/2015   ALKPHOS 86 01/20/2015   BILITOT <0.30 01/20/2015   No results found for: "CHOL", "HDL", "LDLCALC", "LDLDIRECT", "TRIG", "CHOLHDL"  No results found for: "HGBA1C"   Review of Prior Studies    Echocardiogram 12/10/2020 1. Left ventricular ejection fraction, by estimation, is 60 to 65%. The  left ventricle has normal function. The left ventricle has no regional  wall motion abnormalities. Left ventricular diastolic parameters are  consistent with Grade I diastolic  dysfunction (impaired relaxation).   2. Right ventricular systolic function is normal. The right ventricular  size is normal.   3. The mitral valve is normal in structure. Trivial mitral valve  regurgitation. No evidence of mitral stenosis.  4. The aortic valve is normal in structure. Aortic valve regurgitation is  not visualized. No aortic stenosis is present.   5. The inferior vena cava is normal in size with greater than 50%  respiratory variability, suggesting right atrial pressure of 3 mmHg.    Assessment & Plan   1.  PSVT: She does not tolerate p.m. dose of metoprolol as she states that she has a headache and left facial discomfort.  Taking Tylenol does make this go away.  I have advised her to go back to her usual dose of metoprolol in the morning  She is taking 12.5 mg daily at this point.  If she has recurrence or worsening PSVT she can take a total of 25 mg in the morning instead of the 12.5.  2.  Hyperlipidemia: Continue atorvastatin 20 mg daily as  directed.  Labs are followed by PCP.  3.  Hypertension: Remains on amlodipine 2.5 mg daily.  Is well-controlled based upon her readings that she brings with her today in the office.  No changes in her regimen as her blood pressures ranging from 121/75 to 133/87.         Signed, Bettey Mare. Liborio Nixon, ANP, AACC   09/20/2022 4:57 PM      Office [phone] Fax [phone]  Notice: This dictation was prepared with Dragon dictation along with smaller phrase technology. Any transcriptional errors that result from this process are unintentional and may not be corrected upon review.

## 2022-09-15 ENCOUNTER — Encounter
Payer: Medicare Other | Attending: Physical Medicine and Rehabilitation | Admitting: Physical Medicine and Rehabilitation

## 2022-09-15 ENCOUNTER — Ambulatory Visit: Payer: Medicare Other | Admitting: Occupational Therapy

## 2022-09-15 ENCOUNTER — Encounter: Payer: Self-pay | Admitting: Occupational Therapy

## 2022-09-15 ENCOUNTER — Encounter: Payer: Self-pay | Admitting: Physical Medicine and Rehabilitation

## 2022-09-15 ENCOUNTER — Ambulatory Visit: Payer: Medicare Other | Admitting: Physical Therapy

## 2022-09-15 VITALS — BP 123/81 | HR 72 | Ht 60.0 in | Wt 125.0 lb

## 2022-09-15 DIAGNOSIS — M1712 Unilateral primary osteoarthritis, left knee: Secondary | ICD-10-CM | POA: Insufficient documentation

## 2022-09-15 DIAGNOSIS — R251 Tremor, unspecified: Secondary | ICD-10-CM | POA: Diagnosis not present

## 2022-09-15 DIAGNOSIS — R293 Abnormal posture: Secondary | ICD-10-CM

## 2022-09-15 DIAGNOSIS — M1711 Unilateral primary osteoarthritis, right knee: Secondary | ICD-10-CM | POA: Diagnosis not present

## 2022-09-15 DIAGNOSIS — R2689 Other abnormalities of gait and mobility: Secondary | ICD-10-CM | POA: Diagnosis not present

## 2022-09-15 DIAGNOSIS — R2681 Unsteadiness on feet: Secondary | ICD-10-CM | POA: Diagnosis not present

## 2022-09-15 DIAGNOSIS — G20C Parkinsonism, unspecified: Secondary | ICD-10-CM | POA: Diagnosis not present

## 2022-09-15 DIAGNOSIS — R278 Other lack of coordination: Secondary | ICD-10-CM | POA: Diagnosis not present

## 2022-09-15 DIAGNOSIS — R279 Unspecified lack of coordination: Secondary | ICD-10-CM | POA: Diagnosis not present

## 2022-09-15 MED ORDER — BETAMETHASONE SOD PHOS & ACET 6 (3-3) MG/ML IJ SUSP
12.0000 mg | Freq: Once | INTRAMUSCULAR | Status: AC
Start: 2022-09-15 — End: 2022-09-15
  Administered 2022-09-15: 12 mg via INTRAMUSCULAR

## 2022-09-15 MED ORDER — LIDOCAINE HCL 1 % IJ SOLN
2.0000 mL | Freq: Once | INTRAMUSCULAR | Status: AC
Start: 2022-09-15 — End: 2022-09-15
  Administered 2022-09-15: 2 mL

## 2022-09-15 NOTE — Patient Instructions (Signed)
Plan: steroid injection was performed at B/L knees with celestone using 1% plain Lidocaine and 40mg  /1cc of celestone This was well tolerated.  Cleaned with betadine x3 and allowed to dry- then alcohol then injected using 27 gauge 1.5 inch needle- no bleeding or complications.   F/U in 3 months for steroid injections of B/L knees with celstone  Lidocaine will kick in 15 minutes- and wear off tonight- the steroid will kick in tomorrow within 24 hours and take up to 72 hours to fully kick in.  2. Call me when/if they wear off to give me time to send you to Ortho for possible gel injections.

## 2022-09-15 NOTE — Progress Notes (Signed)
  Patient is a 82 yr old female with 1 kidney (has R- donated L to husband 10 yrs ago), Parkinson's disease, HTN, hx of Breast CA 9 yrs ago- R breast- s/p lumpectomy; HLD; mild asthma here for  f/u for lumbar radiculopathy. 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 09/18/19 Back injection- R L4-5 transforminal.  epidural steroid injection.  Here for f/u for chronic pain and B/L knee injections.       Knees are hurting- when turns a certain way, pops.  Only lasted a couple of days at most    The last set of injections didn't work- lasted 2-3 days- but had lasted less and less over time-  even doing Celestone injections.    Exam: Awake, alert, appropriate, NAD Knees has effusions- B/L- puffy medially aspect of distal patella;  Usually uses cane to walk, but left in car.    Plan: steroid injection was performed at B/L knees with celestone using 1% plain Lidocaine and 40mg  /1cc of celestone This was well tolerated.  Cleaned with betadine x3 and allowed to dry- then alcohol then injected using 27 gauge 1.5 inch needle- no bleeding or complications.   F/U in 3 months for steroid injections of B/L knees with celstone  Lidocaine will kick in 15 minutes- and wear off tonight- the steroid will kick in tomorrow within 24 hours and take up to 72 hours to fully kick in.  2. Call me when/if they wear off to give me time to send you to Ortho for possible gel injections.

## 2022-09-15 NOTE — Therapy (Signed)
OUTPATIENT PHYSICAL THERAPY NEURO TREATMENT   Patient Name: April Church MRN: [id number] DOB:April 13, 1940, 82 y.o., female Today's Date: 09/15/2022   PCP: Lorenda Ishihara, MD REFERRING PROVIDER: Vladimir Faster, DO  END OF SESSION:  PT End of Session - 09/15/22 1319     Visit Number 9    Number of Visits 13   Plus eval   Date for PT Re-Evaluation 10/06/22   Recert   Authorization Type Medicare A & B    PT Start Time 1317    PT Stop Time 1402    PT Time Calculation (min) 45 min    Equipment Utilized During Treatment Gait belt    Activity Tolerance Patient tolerated treatment well    Behavior During Therapy WFL for tasks assessed/performed               Past Medical History:  Diagnosis Date   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 2011- no further tx. now- Dr. Donnie Coffin , Dr. Dayton Scrape   Cyst of finger 11/2011   annular cyst right long finger   Dental crowns present    Dermatitis    Frequency of urination    GERD (gastroesophageal reflux disease)    Hemorrhoid    History of colon polyps 2009   Also noted 2012 and 2017 by colonoscopy.   History of shingles 1971   Had right ischial recurrence in the March 2019   Hyperlipidemia    On atorvastatin   Hypertension    under control, has been on med. x 2 yrs.   Liposarcoma of left shoulder (HCC) 1969   Parkinson's disease    With mild tremor (neurologist Dr. Marjory Lies), neurosurgeon Dr. Lovell Sheehan   PONV (postoperative nausea and vomiting)    Seasonal allergies    Shingles of eyelid 1971   Right eyelid; recurrent -> last episode 05/11/2017   Tremors of nervous system    hands-essential tremor; associated with Parkinson's.   Trigger finger of right hand 11/2011   long finger   Past Surgical History:  Procedure Laterality Date   14-day Zio Patch Monitor  08/2020    (report to be scanned): Predominant SR w/ HR 61 to 142 bpm and average 86 bpm.  Total of 59 episodes of PAT/PSVT  4 to 15 beats (not noted on diary).  Fastest was 5 beats at a rate of 193 bpm, longest was 15 beats at a rate of 106 bpm.  Rare isolated PACs (as well as couplets and triplets) with rare isolated PVCs.  No sustained arrhythmias or bradycardia to explain syncope.   ANTERIOR CERVICAL DECOMPRESSION/DISCECTOMY FUSION 4 LEVELS N/A 11/14/2013   Procedure: ANTERIOR CERVICAL DECOMPRESSION/DISCECTOMY FUSION 4 LEVELS;  Surgeon: Tressie Stalker, MD;  Location: [facility];  Service: Neurosurgery;  Laterality: N/A;  C34 C45 C56 C67 anterior cervical fusion with interbody prosthesis plating and  bonegraft   APPENDECTOMY  age 82   BREAST LUMPECTOMY  06/16/2009   left; SLN bx.   BREAST LUMPECTOMY  07/01/2009   re-excision   BREAST SURGERY  02/22/1997   reduction   CATARACT EXTRACTION, BILATERAL     COLONOSCOPY WITH PROPOFOL N/A 12/03/2014   Procedure: COLONOSCOPY WITH PROPOFOL;  Surgeon: Charolett Bumpers, MD;  Location: [facility];  Service: Endoscopy;  Laterality: N/A;   FOOT SURGERY  06/22/2011   left   KNEE  ARTHROSCOPY  03/12/2005   right   KNEE ARTHROSCOPY     left   Lower Extremity Venous Dopplers  12/12/2020   No DVT bilaterally in the deep veins or superficial veins.  No deep or superficial venous reflux noted bilaterally with exception of Right SSV at the knee.   NASAL SINUS SURGERY     x 2   NEPHRECTOMY LIVING DONOR Left 04/22/2008   donated to spouse 2010([facility])   TRANSTHORACIC ECHOCARDIOGRAM  12/10/2020   EF 60 to 65%.  Normal LV size and function.  No heart WMA.  GR 1 DD.  Normal RV, RVP and RAP.[name]  Normal valves.   TRIGGER FINGER RELEASE  12/16/2011   Procedure: RELEASE TRIGGER FINGER/A-1 PULLEY;  Surgeon: Tami Ribas, MD;  Location: [facility];  Service: Orthopedics;  Laterality: Right;  RIGHT LONG FINGER TRIGGER RELEASE & ANNULAR CYST EXCISION   TUMOR  EXCISION  age 55   right arm   Patient Active Problem List   Diagnosis Date Noted   Rectal bleeding 07/27/2022   Hemorrhoids 07/27/2022   Chronic pain of both knees 06/21/2022   Moderate persistent asthma without complication 03/27/2021   PSVT (paroxysmal supraventricular tachycardia) 11/26/2020   Syncope and collapse 11/24/2020   Primary osteoarthritis of right knee 05/12/2020   Primary osteoarthritis of left knee 05/12/2020   Hyperlipidemia    Bilateral calf pain 03/05/2019   Genetic testing 02/13/2019   Family history of ovarian cancer    Family history of bladder cancer    Family history of colon cancer    Family history of leukemia    Lumbar radiculopathy 12/08/2018   Myofascial pain 12/08/2018   Impaired gait and mobility 12/08/2018   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   Chronic nonallergic rhinitis 12/29/2016   Mild persistent asthma, uncomplicated 12/29/2016   Vocal fold paralysis, 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   Gastroesophageal reflux disease 12/29/2016   Cervical spondylosis with myelopathy and radiculopathy 11/14/2013   Essential tremor 06/13/2013   Cervical spondylosis without myelopathy 06/13/2013   Breast cancer of lower-outer quadrant of left female breast (HCC) 09/10/2010    ONSET DATE: 07/22/2022 (referral)  REFERRING DIAG: R26.81 (ICD-10-CM) - Gait instability R25.1 (ICD-10-CM) - Tremor R27.9 (ICD-10-CM) - Incoordination  THERAPY DIAG:  Other abnormalities of gait and mobility  Unsteadiness on feet  Abnormal posture  Rationale for Evaluation and Treatment: Rehabilitation  SUBJECTIVE:                                                                                                                                                                                             SUBJECTIVE STATEMENT: No falls. Pt  reports she had injections to her knees this morning. HEP is going okay.    Pt accompanied by: self  PERTINENT HISTORY: Acquired solitary kidney  (04/2008), Asthma, Breast cancer (HCC) (05/2009), Cyst of finger (11/2011), Dental crowns present, Dermatitis, Frequency of urination, GERD (gastroesophageal reflux disease), Hemorrhoid, History of colon polyps (2009), History of shingles (1971), Hyperlipidemia, Hypertension, Liposarcoma of left shoulder (HCC) (1969), Parkinson's disease, PONV (postoperative nausea and vomiting), Seasonal allergies, Shingles of eyelid (1971), Tremors of nervous system, and Trigger finger of right hand (11/2011). here with essential tremor since 1990's. Now with intermittent resting tremor AND bradykinesia since 2016. May represent essential tremor with superimposed parkinson's disease + cervical myelopathy sequelae  PAIN:  Are you having pain? Yes, pt reports some knee pain   PRECAUTIONS: Fall  WEIGHT BEARING RESTRICTIONS: No  FALLS: Has patient fallen in last 6 months? No  LIVING ENVIRONMENT: Lives with: lives alone Lives in: House/apartment Stairs: Yes: Internal: full flight steps; bilateral but cannot reach both and External: 3 steps; on right going up, on left going up, and can reach both Has following equipment at home: Single point cane and Walker - 4 wheeled  PLOF: Independent  PATIENT GOALS: "Being able to control my balance better"  OBJECTIVE:   COGNITION: Overall cognitive status: Within functional limits for tasks assessed   SENSATION: Denies numbness/tingling in BUE/BLEs  COORDINATION: Heel to shin test: WNL bilaterally    POSTURE: rounded shoulders, forward head, and increased thoracic kyphosis  LOWER EXTREMITY ROM:     Active  Right Eval Left Eval  Hip flexion    Hip extension    Hip abduction    Hip adduction    Hip internal rotation    Hip external rotation    Knee flexion    Knee extension    Ankle dorsiflexion    Ankle plantarflexion    Ankle inversion    Ankle eversion     (Blank rows = not tested)  LOWER EXTREMITY MMT:  Tested in seated position   MMT  Right Eval Left Eval  Hip flexion 4- 4  Hip extension    Hip abduction 4 4  Hip adduction 4 4  Hip internal rotation    Hip external rotation    Knee flexion 4- 4  Knee extension 4- 4  Ankle dorsiflexion 4 4  Ankle plantarflexion    Ankle inversion    Ankle eversion    (Blank rows = not tested)  BED MOBILITY:  Independent per pt   TRANSFERS: Assistive device utilized: None  Sit to stand: Modified independence Stand to sit: Modified independence    TODAY'S TREATMENT:             GAIT: Gait pattern: lateral gait deviations, step through pattern, decreased arm swing- Right, decreased arm swing- Left, decreased stride length, knee flexed in stance- Right, knee flexed in stance- Left, lateral hip instability, trunk flexed, and narrow BOS Distance walked: Various clinic distances  Assistive device utilized: [facility] Comments: Intermittent cues for sequencing and posture.  NMR  In // bars for improved step clearance/length, single leg stability and postural control: Side stepping on blue beam, x3 reps down and back w/BUE support progressing to no UE support on final rep w/CGA. Noted pt dragging R foot, so min cues to increased step clearance. No LOB noted, but pt leaning forward.  Added double cone taps along length of beam for added single leg stability challenge and LE coordination, x2 down and back w/single UE support and  progressing to no UE support w/min A for anterior LOB correction. Increased difficulty when tapping w/LLE, as pt frequently knocking cone over.  Sit to stands w/forefeet on blue wedge to bias posterior lean and facilitate increased power w/anterior weight shift and transfers, x8 reps. Pt required BUE support on first two reps but was able to perform remainder without UE support w/single posterior LOB episode. CGA-min A throughout  Progressed to adding 4# ball throws w/intern at top of stand for added immediate standing balance challenge. Pt required min A throughout  due to poor eccentric control and posterior LOB. Max multimodal cues for pt to maintain sequence, as pt frequently forgetting to sit after throwing the ball and would play catch w/intern instead. Pt performed 12 reps w/ball throw and was able to stabilize independently on 25% of reps.  Sidestepping w/sunshine ball tosses w/intern, x20' each direction, for improved set-switching, dual-tasking and step length/clearance. Increased difficulty when stepping to L side > R, requiring mod verbal cues to maintain side stepping rather than rotating to L side. Noted pt leading w/LLE when stepping to R. CGA throughout  Progressed to naming animals/foods w/each ball throw, x20' each direction. Pt frequently forgetting to step when performing task, requiring mod verbal cues to maintain. CGA throughout, min cues to assist pt in naming items.   PATIENT EDUCATION: Education details: Continue POC.  Person educated: Patient Education method: Explanation Education comprehension: verbalized understanding  HOME EXERCISE PROGRAM: Access Code: [email or web address] URL: [email or web address] Date: 08/02/2022 Prepared by: Sherlie Ban  Exercises - Seated Hamstring Stretch  - 1 x daily - 5 x weekly - 1 sets - 3 reps - 30 sec hold - Sit to Stand Without Arm Support  - 1 x daily - 7 x weekly - 3 sets - 10 reps - Standing Balance with Eyes Closed on Foam  - 1 x daily - 5 x weekly - 3 sets - 30 hold - Romberg Stance on Foam Pad with Head Rotation  - 1 x daily - 5 x weekly - 2 sets - 10 reps - Walking with Head Rotation  - 1 x daily - 5 x weekly - 3 sets - Side to Side Weight Shift with Overhead Reach and Counter Support  - 1 x daily - 5 x weekly - 1 sets - 10 reps   GOALS: Goals reviewed with patient? Yes  SHORT TERM GOALS: Target date: 08/26/2022   Pt will be independent with initial HEP for improved strength, balance, transfers and gait.  Baseline: to be reviewed from previous bout of therapy Goal status:  PARTIALLY MET  2.  Pt will improve FGA to at least a 17/30 in order to demo decr fall risk. Baseline: 14/30; 19/30 on 08/23/22 Goal status: MET  3.  Pt will improve condition 4 of mCTSIB to at least 8 seconds in order to demo improved vestibular input for balance.  Baseline: 3-4 seconds   13 seconds on 08/23/22 Goal status: MET  4.  Pt will trial various assistive devices in clinic to determine safest option for home use for fall prevention  Baseline: met - pt using cane  Goal status: MET  5.  Pt will improve gait velocity to at least 2.9 ft/s w/LRAD and SBA for improved gait efficiency and reduced fall risk   Baseline: 2.6 ft/s no AD  On 08/23/22: 23.3 seconds = 1.41 ft/sec with SPC 16.2 seconds = 2.0 ft/sec  Goal status: NOT MET  6.  Pt will teach back and demonstrate fall  prevention techniques to implement at home and in community for reduced fall risk  Baseline: pt using SPC, provided fall prevention handout   Goal status: MET   LONG TERM GOALS: Target date: 09/29/2022 (updated to match cert date)     Pt will be independent with final HEP for improved strength, balance, transfers and gait.  Baseline:  Goal status: INITIAL  2.  Pt will improve condition 4 of mCTSIB to at least 17 seconds in order to demo improved vestibular input for balance. Baseline: 3-4 seconds, 13 seconds  Goal status: REVISED  3.  Pt will improve FGA to at least a 20/30 in order to demo decr fall risk. Baseline: 14/30; 20/30 (7/17) Goal status: MET  4.  Pt will improve gait velocity to at least 3.1 ft/s w/LRAD mod I for improved gait efficiency and independence  Baseline: 2.6 ft/s no AD Goal status: INITIAL  5.  Pt will improve 5 x STS to less than or equal to 14 w/o UE support seconds to demonstrate improved functional strength and transfer efficiency.   Baseline: 18.5s no UE support Goal status: INITIAL   ASSESSMENT:  CLINICAL IMPRESSION: Emphasis of skilled PT session on set switching,  anterior weight shift, single leg stability and LE coordination. Pt reports she did not take her cane into her pain management appointment this morning because she "does not like it", so continued to emphasize importance of using cane at all times for fall risk reduction. Pt very challenged by set-switching and dual-tasking this date, requiring up to max concurrent cues to perform task properly despite repetition. Noted decreased step clearance of RLE w/balance tasks but increased difficulty side stepping to L side throughout session. Continue POC.    OBJECTIVE IMPAIRMENTS: Abnormal gait, decreased activity tolerance, decreased balance, decreased coordination, decreased endurance, decreased knowledge of use of DME, decreased mobility, difficulty walking, decreased ROM, decreased strength, improper body mechanics, postural dysfunction, and pain  ACTIVITY LIMITATIONS: carrying, lifting, bending, squatting, stairs, transfers, and locomotion level  PARTICIPATION LIMITATIONS: shopping, community activity, yard work, and church  PERSONAL FACTORS: Age, Fitness, and 1 comorbidity: essential tremor  are also affecting patient's functional outcome.   REHAB POTENTIAL: Good  CLINICAL DECISION MAKING: Evolving/moderate complexity  EVALUATION COMPLEXITY: Moderate  PLAN:  PT FREQUENCY: 1-2x/week  PT DURATION: 6 weeks + 4 weeks (recert)   PLANNED INTERVENTIONS: Therapeutic exercises, Therapeutic activity, Neuromuscular re-education, Balance training, Gait training, Patient/Family education, Self Care, Joint mobilization, Stair training, Vestibular training, Canalith repositioning, DME instructions, Manual therapy, and Re-evaluation  PLAN FOR NEXT SESSION: continue working on gait with cane. Work on balance on compliant surfaces, retro gait, head motions, obstacles, SLS. Lateral stepping strategies    Jill Alexanders Michal Strzelecki, PT, DPT 09/15/2022, 2:08 PM

## 2022-09-15 NOTE — Patient Instructions (Signed)
Keeping Thinking Skills Sharp: 1. Jigsaw puzzles 2. Card/board games 3. Talking on the phone/social events 4. [email or web address] 5. Online games 6. Word serches/crossword puzzles 7.  Logic puzzles 8. Aerobic exercise (stationary bike) 9. Eating balanced diet (fruits & veggies) 10. Drink water 11. Try something new--new recipe, hobby 12. Crafts 13. Do a variety of activities that are challenging 14.  Plan weekly meals and write a grocery list 15. Add cognitive activities to walking/exercising (think of animal/food/city with each letter of the alphabet, counting backwards, thinking of as many vegetables as you can, etc.).--Only do this  If safe (no freezing/falls).   Memory Compensation Strategies  Use "WARM" strategy.  W= write it down  A= associate it  R= repeat it  M= make a mental note  2.   You can keep a Glass blower/designer.  Use a 3-ring notebook with sections for the following: calendar, important names and phone numbers,  medications, doctors' names/phone numbers, lists/reminders, and a section to journal what you did  each day.   3.    Use a calendar to write appointments down.  4.    Write yourself a schedule for the day.  This can be placed on the calendar or in a separate section of the Memory Notebook.  Keeping a  regular schedule can help memory.  5.    Use medication organizer with sections for each day or morning/evening pills.  You may need help loading it  6.    Keep a basket, or pegboard by the door.  Place items that you need to take out with you in the basket or on the pegboard.  You may also want to  include a message board for reminders.  7.    Use sticky notes.  Place sticky notes with reminders in a place where the task is performed.  For example: " turn off the  stove" placed by the stove, "lock the door" placed on the door at eye level, " take your medications" on  the bathroom mirror or by the place where you normally take your medications.  8.    Use  alarms/timers.  Use while cooking to remind yourself to check on food or as a reminder to take your medicine, or as a  reminder to make a call, or as a reminder to perform another task, etc.

## 2022-09-15 NOTE — Therapy (Signed)
OUTPATIENT OCCUPATIONAL THERAPY TREATMENT  Patient Name: April Church MRN: [id number] DOB:Oct 18, 1940, 82 y.o., female Today's Date: 09/15/2022  PCP: Lorenda Ishihara, MD  REFERRING PROVIDER: Vladimir Faster, DO   END OF SESSION:  OT End of Session - 09/15/22 1406     Visit Number 9    Number of Visits 11    Date for OT Re-Evaluation 09/27/22    Authorization Type Medicare    OT Start Time 1405    OT Stop Time 1445    OT Time Calculation (min) 40 min    Activity Tolerance Patient tolerated treatment well    Behavior During Therapy [facility] for tasks assessed/performed             Past Medical History:  Diagnosis Date   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 2011- no further tx. now- Dr. Donnie Coffin , Dr. Dayton Scrape   Cyst of finger 11/2011   annular cyst right long finger   Dental crowns present    Dermatitis    Frequency of urination    GERD (gastroesophageal reflux disease)    Hemorrhoid    History of colon polyps 2009   Also noted 2012 and 2017 by colonoscopy.   History of shingles 1971   Had right ischial recurrence in the March 2019   Hyperlipidemia    On atorvastatin   Hypertension    under control, has been on med. x 2 yrs.   Liposarcoma of left shoulder (HCC) 1969   Parkinson's disease    With mild tremor (neurologist Dr. Marjory Lies), neurosurgeon Dr. Lovell Sheehan   PONV (postoperative nausea and vomiting)    Seasonal allergies    Shingles of eyelid 1971   Right eyelid; recurrent -> last episode 05/11/2017   Tremors of nervous system    hands-essential tremor; associated with Parkinson's.   Trigger finger of right hand 11/2011   long finger   Past Surgical History:  Procedure Laterality Date   14-day Zio Patch Monitor  08/2020   (report to be scanned): Predominant SR w/ HR 61 to 142 bpm and average 86 bpm.   Total of 59 episodes of PAT/PSVT  4 to 15 beats (not noted on diary).  Fastest was 5 beats at a rate of 193 bpm, longest was 15 beats at a rate of 106 bpm.  Rare isolated PACs (as well as couplets and triplets) with rare isolated PVCs.  No sustained arrhythmias or bradycardia to explain syncope.   ANTERIOR CERVICAL DECOMPRESSION/DISCECTOMY FUSION 4 LEVELS N/A 11/14/2013   Procedure: ANTERIOR CERVICAL DECOMPRESSION/DISCECTOMY FUSION 4 LEVELS;  Surgeon: Tressie Stalker, MD;  Location: [facility];  Service: Neurosurgery;  Laterality: N/A;  C34 C45 C56 C67 anterior cervical fusion with interbody prosthesis plating and  bonegraft   APPENDECTOMY  age 67   BREAST LUMPECTOMY  06/16/2009   left; SLN bx.   BREAST LUMPECTOMY  07/01/2009   re-excision   BREAST SURGERY  02/22/1997   reduction   CATARACT EXTRACTION, BILATERAL     COLONOSCOPY WITH PROPOFOL N/A 12/03/2014   Procedure: COLONOSCOPY WITH PROPOFOL;  Surgeon: Charolett Bumpers, MD;  Location: [facility];  Service: Endoscopy;  Laterality: N/A;   FOOT SURGERY  06/22/2011   left   KNEE ARTHROSCOPY  03/12/2005   right   KNEE ARTHROSCOPY     left   Lower Extremity Venous  Dopplers  12/12/2020   No DVT bilaterally in the deep veins or superficial veins.  No deep or superficial venous reflux noted bilaterally with exception of Right SSV at the knee.   NASAL SINUS SURGERY     x 2   NEPHRECTOMY LIVING DONOR Left 04/22/2008   donated to spouse 2010([facility])   TRANSTHORACIC ECHOCARDIOGRAM  12/10/2020   EF 60 to 65%.  Normal LV size and function.  No heart WMA.  GR 1 DD.  Normal RV, RVP and RAP.[name]  Normal valves.   TRIGGER FINGER RELEASE  12/16/2011   Procedure: RELEASE TRIGGER FINGER/A-1 PULLEY;  Surgeon: Tami Ribas, MD;  Location: [facility];  Service: Orthopedics;  Laterality: Right;  RIGHT LONG FINGER TRIGGER RELEASE & ANNULAR CYST EXCISION   TUMOR EXCISION  age 60   right arm   Patient Active Problem List   Diagnosis Date  Noted   Rectal bleeding 07/27/2022   Hemorrhoids 07/27/2022   Chronic pain of both knees 06/21/2022   Moderate persistent asthma without complication 03/27/2021   PSVT (paroxysmal supraventricular tachycardia) 11/26/2020   Syncope and collapse 11/24/2020   Primary osteoarthritis of right knee 05/12/2020   Primary osteoarthritis of left knee 05/12/2020   Hyperlipidemia    Bilateral calf pain 03/05/2019   Genetic testing 02/13/2019   Family history of ovarian cancer    Family history of bladder cancer    Family history of colon cancer    Family history of leukemia    Lumbar radiculopathy 12/08/2018   Myofascial pain 12/08/2018   Impaired gait and mobility 12/08/2018   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   Chronic nonallergic rhinitis 12/29/2016   Mild persistent asthma, uncomplicated 12/29/2016   Vocal fold paralysis, 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   Gastroesophageal reflux disease 12/29/2016   Cervical spondylosis with myelopathy and radiculopathy 11/14/2013   Essential tremor 06/13/2013   Cervical spondylosis without myelopathy 06/13/2013   Breast cancer of lower-outer quadrant of left female breast (HCC) 09/10/2010    ONSET DATE: 07/22/2022 (Date of referral)  REFERRING DIAG:  R26.81 (ICD-10-CM) - Gait instability  R25.1 (ICD-10-CM) - Tremor  R27.9 (ICD-10-CM) - Incoordination    THERAPY DIAG:  Unsteadiness on feet  Abnormal posture  Other lack of coordination  Rationale for Evaluation and Treatment: Rehabilitation  SUBJECTIVE:   SUBJECTIVE STATEMENT: No pain and no recent falls  Pt accompanied by: self  PERTINENT HISTORY: Hx of essential tremor and previously diagnosed with Parkinson's; however, she is to undergo further testing to see if it confirms this diagnosis. Pt referred to OP OT following Parkinson's screen due to concerns with UE coordination.   PRECAUTIONS: Fall  WEIGHT BEARING RESTRICTIONS: No  PAIN:  Are you having pain? Yes: NPRS scale: 3-5/10 Pain  location: B knees  FALLS: Has patient fallen in last 6 months? No  LIVING ENVIRONMENT: Lives with: lives with their family and lives alone Lives in: House/apartment Stairs: Yes: Internal: 12-13 steps; can reach both and External: 3 steps; can reach both; she is able to live on main floor Has following equipment at home: Single point cane, Walker - 4 wheeled, shower chair, and Grab bars  PLOF: Independent; driving; retired Therapist, nutritional and Research officer, political party; reading; watching tv; visiting friends  PATIENT GOALS: to stay as independent as possible for as long as possible  OBJECTIVE:   HAND DOMINANCE: Right  ADLs: Overall ADLs: mod I  IADLs: Light housekeeping: hired help every 2 weeks  Handwriting: 75% legible and worse at end of day  MOBILITY  STATUS: Independent and uses cane occassionally  POSTURE COMMENTS:  rounded shoulders  ACTIVITY TOLERANCE: Activity tolerance: good  FUNCTIONAL OUTCOME MEASURES: Fastening/unfastening 3 buttons: 36 Physical performance test: PPT#2 (simulated eating) 12 seconds & PPT#4 (donning/doffing jacket): 16  COORDINATION: 9 Hole Peg test: Right: 44 sec; Left: 32 sec  UE ROM:  WFL  UE MMT:   [facility]  08/02/2022 - Grip: R - 26.6 lbs; L - 27.1 lbs  SENSATION: WFL  MUSCLE TONE: RUE: Mild and Rigidity and LUE: Mild and Rigidity  COGNITION: Overall cognitive status: Within functional limits for tasks assessed  OBSERVATIONS: Dyskinesias and essential tremor   TODAY'S TREATMENT:                                                                                                                            Assessed goals and progress to date - see below.  Pt still has trouble with buttons with no improvement therefore showed pt button hook and provided handout. Pt practiced on her own shirt with success, min cues. Pt instructed on how to purchase  Practiced donning/doffing jacket cape method with good success - met PPT #4  Pt issued memory strategies  and ways to keep thinking skills sharp and reviewed  Pt was given 100 pc jigsaw puzzle to borrow and bring back next week  PATIENT EDUCATION: Education details: keeping thinking skills sharp, memory strategies, button hook info Person educated: Patient Education method: Explanation, Demonstration, and Handouts Education comprehension: verbalized understanding and needs further education  HOME EXERCISE PROGRAM: 08/02/2022: [place]! Hands and prior HEPs 08/09/2022: Tremor compensatory strategies 08/11/22: writing strategies 08/16/22: Coordination HEP  08/18/22: Posture ex 08/23/22: 3 bag ex's 09/08/2022: Pen tricks 09/15/22: keeping thinking skills sharp, memory strategies  GOALS:  LONG TERM GOALS: Target date: 08/27/2022    Pt will be independent with HEP.  Baseline: not yet initiated Goal status: MET  2.  Pt will verbalize understanding of adapted strategies to maximize safety and independence with ADLs/IADLs.  Baseline: not yet initiated Goal status: MET  3.  Pt will demonstrate improved fine motor coordination for ADLs as evidenced by decreasing 9 hole peg test score for R hand by at least 10 secs  Baseline: 44 secs Goal status: 09/15/22: 38 sec  5.  Pt will demonstrate improved ease with fastening buttons as evidenced by decreasing 3 button/unbutton time by at least 4 seconds  Baseline: 36 seconds (55 sec)  Goal status: NOT MET   6.  Pt will write a short paragraph with no significant decrease in size and maintain 90% legibility.  Baseline: 75% legibility Goal status: MET  7. Pt will demonstrate increased ease with dressing as evidenced by decreasing PPT#4 (don/ doff jacket) to 13 secs or less.  Baseline: 16 secs Goal status: MET (13 sec)      ASSESSMENT:  CLINICAL IMPRESSION: Pt reports decreased memory and will benefit from review of ex's. Pt has met most LTG's at this time  PERFORMANCE DEFICITS:  in functional skills including ADLs, IADLs, coordination, strength,  Fine motor control, mobility, and UE functional use  IMPAIRMENTS: are limiting patient from ADLs, IADLs, and leisure.   COMORBIDITIES:  may have co-morbidities  that affects occupational performance. Patient will benefit from skilled OT to address above impairments and improve overall function.  REHAB POTENTIAL: Good  PLAN:  OT FREQUENCY: 1x/week  OT DURATION: 4 additional weeks  PLANNED INTERVENTIONS: self care/ADL training, therapeutic exercise, therapeutic activity, functional mobility training, patient/family education, coping strategies training, DME and/or AE instructions, and Re-evaluation  RECOMMENDED OTHER SERVICES: none at this time  CONSULTED AND AGREED WITH PLAN OF CARE: Patient  PLAN FOR NEXT SESSION: ask about puzzle, check remaining goal, toss scarf w/ simple cognitive task if possible, review pen tricks, possible d/c next session, or by following session   Sheran Lawless, OT 09/15/2022, 2:07 PM

## 2022-09-16 ENCOUNTER — Telehealth: Payer: Self-pay | Admitting: Neurology

## 2022-09-16 NOTE — Telephone Encounter (Signed)
Called patients and gave results patient very happy with results but I did get her scheduled for a virtual visit on Friday Aug 2nd

## 2022-09-16 NOTE — Telephone Encounter (Signed)
Pts skin biopsy came back.  There was:  No evidence of alpha synuclein in the cutaneous nerves 2.  No evidence of small fiber neuropathy 3.  noevidence of amyloid deposition within the cutaneous nerves  Please call pt/family and let them know that her biopsy was normal.  This is really consistent with my clinical examination that I didn't suspect Parkinsons Disease.  If she would like, we can make an appt via video next Friday to discuss more about next steps

## 2022-09-20 ENCOUNTER — Encounter: Payer: Self-pay | Admitting: Adult Health

## 2022-09-20 ENCOUNTER — Ambulatory Visit: Payer: Medicare Other | Attending: Adult Health | Admitting: Adult Health

## 2022-09-20 VITALS — BP 120/86 | HR 71 | Ht 60.0 in | Wt 125.4 lb

## 2022-09-20 DIAGNOSIS — E7800 Pure hypercholesterolemia, unspecified: Secondary | ICD-10-CM

## 2022-09-20 DIAGNOSIS — I10 Essential (primary) hypertension: Secondary | ICD-10-CM

## 2022-09-20 DIAGNOSIS — I471 Supraventricular tachycardia, unspecified: Secondary | ICD-10-CM

## 2022-09-20 NOTE — Patient Instructions (Signed)
Medication Instructions:  No Changes *If you need a refill on your cardiac medications before your next appointment, please call your pharmacy*   Lab Work: No Labs If you have labs (blood work) drawn today and your tests are completely normal, you will receive your results only by: MyChart Message (if you have MyChart) OR A paper copy in the mail If you have any lab test that is abnormal or we need to change your treatment, we will call you to review the results.   Testing/Procedures: No Testing   Follow-Up: At [facility], you and your health needs are our priority.  As part of our continuing mission to provide you with exceptional heart care, we have created designated Provider Care Teams.  These Care Teams include your primary Cardiologist (physician) and Advanced Practice Providers (APPs -  Physician Assistants and Nurse Practitioners) who all work together to provide you with the care you need, when you need it.  We recommend signing up for the patient portal called "MyChart".  Sign up information is provided on this After Visit Summary.  MyChart is used to connect with patients for Virtual Visits (Telemedicine).  Patients are able to view lab/test results, encounter notes, upcoming appointments, etc.  Non-urgent messages can be sent to your provider as well.   To learn more about what you can do with MyChart, go to [email or web address].    Your next appointment:   6 month(s)  Provider:   David Harding, MD   

## 2022-09-22 ENCOUNTER — Ambulatory Visit: Payer: Medicare Other | Admitting: Occupational Therapy

## 2022-09-22 ENCOUNTER — Telehealth: Payer: Self-pay | Admitting: *Deleted

## 2022-09-22 ENCOUNTER — Ambulatory Visit: Payer: Medicare Other | Admitting: Physical Therapy

## 2022-09-22 ENCOUNTER — Encounter: Payer: Self-pay | Admitting: Physical Therapy

## 2022-09-22 DIAGNOSIS — R2681 Unsteadiness on feet: Secondary | ICD-10-CM | POA: Diagnosis not present

## 2022-09-22 DIAGNOSIS — R293 Abnormal posture: Secondary | ICD-10-CM | POA: Diagnosis not present

## 2022-09-22 DIAGNOSIS — R278 Other lack of coordination: Secondary | ICD-10-CM

## 2022-09-22 DIAGNOSIS — R2689 Other abnormalities of gait and mobility: Secondary | ICD-10-CM

## 2022-09-22 DIAGNOSIS — R279 Unspecified lack of coordination: Secondary | ICD-10-CM | POA: Diagnosis not present

## 2022-09-22 DIAGNOSIS — R251 Tremor, unspecified: Secondary | ICD-10-CM | POA: Diagnosis not present

## 2022-09-22 NOTE — Telephone Encounter (Signed)
Pt says she was told to call back and report whether knee injection worked. Knee injection did hot help.

## 2022-09-22 NOTE — Progress Notes (Signed)
Virtual Visit Via Video    Consent was obtained for video visit:  Yes.   Answered questions that patient had about telehealth interaction:  Yes.   I discussed the limitations, risks, security and privacy concerns of performing an evaluation and management service by telemedicine. I also discussed with the patient that there may be a patient responsible charge related to this service. The patient expressed understanding and agreed to proceed.  Pt location: Home Physician Location: office Name of referring provider:  Lorenda Ishihara,* I connected with April Church at patients initiation/request on 09/24/2022 at 11:15 AM EDT by video enabled telemedicine application and verified that I am speaking with the correct person using two identifiers. Pt MRN:  [id number] Pt DOB:  08/12/1940 Video Participants:  April Church;    Assessment/Plan:  1.  Tremor  -Patient with longstanding history of essential tremor  -Patient's skin biopsy was negative for alpha-synuclein  -I am not convinced that the patient has Parkinson's disease, but anytime actually saw the patient in person she was on levodopa.  Nonetheless, I am going to have her go ahead and wean off of the levodopa given results of the skin biopsy, and see how she does clinically.  She was given a 6-week weaning schedule.       -Patient previously saw Dr. Marjory Lies and Dr. Rubin Payor             -Patient had DaTscan at [facility] in February, 2020 and it stated that radiotracer uptake was mildly decreased bilaterally, but felt to be 2 standard deviations within age-matched controls.             -Records indicate that Dr. Westley Hummer did on/off testing in February, 2020 and felt that levodopa helped tremor but did not help bradykinesia (UPDRS off score was 40 and on score was still 31).  DBS was offered and declined at that time because patient's husband was ill.             -Neurocognitive testing at [facility]  February, 2020 with evidence of MCI, but concerns for developing Alzheimer's.  I did not see evidence of that today  2.  Spasmodic dysphonia  -Doing B[facility] at [facility] and has been doing this since 2017  3.  Hemifacial spasm, left  -Not bothersome to patient.  She has a little bit of blepharospasm as well  4.  I will see the patient back in the office in 7 weeks, at which point we will determine further measures.  Subjective   Patient seen today in follow-up.  She returns today to discuss results of skin biopsy, which was negative for alpha-synuclein.  Current movement d/o meds: Carbidopa/levodopa 25/100, 2 tablet 3 times per day primidone, 100mg  three times day  PREVIOUS MEDICATIONS:  primidone, up to 250 mg twice per day; carbidopa/levodopa ; entacapone    Current Outpatient Medications on File Prior to Visit  Medication Sig Dispense Refill   albuterol (VENTOLIN HFA) 108 (90 Base) MCG/ACT inhaler Inhale 1-2 puffs into the lungs every 6 (six) hours as needed for wheezing or shortness of breath. 18 g 1   amLODipine (NORVASC) 2.5 MG tablet Take one tablet each day. (Patient not taking: Reported on 09/20/2022) 90 tablet 3   atorvastatin (LIPITOR) 20 MG tablet Take 20 mg by mouth daily.     budesonide-formoterol (SYMBICORT) 160-4.5 MCG/ACT inhaler Inhale 2 puffs into the lungs as needed.     CALCIUM CARBONATE PO Take 1 tablet by  mouth 2 (two) times daily.     carbidopa-levodopa (SINEMET IR) 25-100 MG tablet Take 2 tablets by mouth 3 (three) times daily. 540 tablet 4   Cholecalciferol (VITAMIN D) 1000 UNITS capsule Take 1,000 Units by mouth 2 (two) times daily.     cyclobenzaprine (FLEXERIL) 5 MG tablet TAKE 1 TO UP TO 2 TABLETS BY MOUTH ONCE DAILY AT BEDTIME AS NEEDED FOR MUSCLE SPASM 50 tablet 11   entacapone (COMTAN) 200 MG tablet Take 1 tablet (200 mg total) by mouth 3 (three) times daily. 90 tablet 12   EPINEPHrine 0.3 mg/0.3 mL IJ SOAJ injection Inject 0.3 mg into the muscle as  needed. 2 each 1   famotidine (PEPCID) 20 MG tablet Take 1 tablet by mouth once daily 30 tablet 5   fluticasone (FLOVENT HFA) 110 MCG/ACT inhaler 1 puff     hydrocortisone (ANUSOL-HC) 2.5 % rectal cream Procto-Med HC 2.5 % topical cream perineal applicator  APPLY CREAM RECTALLY TO AFFECTED AREA TWICE DAILY FOR 7 DAYS 30 g 1   ipratropium (ATROVENT) 0.03 % nasal spray USE 2 SPRAY(S) IN EACH NOSTRIL TWICE DAILY 30 mL 5   ketotifen (ZADITOR) 0.025 % ophthalmic solution Apply 1 drop to eye daily.     levocetirizine (XYZAL) 5 MG tablet TAKE 1 TABLET BY MOUTH ONCE DAILY IN THE EVENING . APPOINTMENT REQUIRED FOR FUTURE REFILLS 30 tablet 0   metoprolol succinate (TOPROL XL) 25 MG 24 hr tablet Take 0.5 tablets (12.5 mg total) by mouth daily. (Patient not taking: Reported on 09/20/2022) 30 tablet 5   metoprolol succinate (TOPROL-XL) 25 MG 24 hr tablet Take 1 tablet (25 mg total) by mouth at bedtime. (Patient not taking: Reported on 09/20/2022) 90 tablet 3   metoprolol succinate (TOPROL-XL) 25 MG 24 hr tablet Take 12.5 mg by mouth daily. Take 1/2 Tablet In The Morning     Na Sulfate-K Sulfate-Mg Sulf 17.5-3.13-1.6 GM/177ML SOLN Use as directed; may use generic; goodrx card if insurance will not cover generic 9912 each 0   nystatin ointment (MYCOSTATIN) Apply 1 Application topically 2 (two) times daily.     Polyethyl Glycol-Propyl Glycol (SYSTANE OP) Place 1 drop into both eyes 2 (two) times daily.     primidone (MYSOLINE) 50 MG tablet Take 2 tablets (100 mg total) by mouth 3 (three) times daily. 540 tablet 4   traMADol (ULTRAM) 50 MG tablet TAKE 1 TABLET BY MOUTH EVERY 12 HOURS AS NEEDED 20 tablet 0   triamcinolone ointment (KENALOG) 0.1 % Apply 1 Application topically as needed.     venlafaxine (EFFEXOR) 75 MG tablet Take 1 tablet (75 mg total) by mouth daily. 90 tablet 1   No current facility-administered medications on file prior to visit.     Objective   There were no vitals filed for this  visit. GEN:  The patient appears stated age and is in NAD.  Neurological examination:  Orientation: The patient is alert and oriented x3. Cranial nerves: There is good facial symmetry. There is no facial hypomimia.  The speech is fluent and clear. Soft palate rises symmetrically and there is no tongue deviation. Hearing is intact to conversational tone. Motor: Strength is at least antigravity x 4.   Shoulder shrug is equal and symmetric.  There is no pronator drift.  Movement examination: Tone: unable Abnormal movements: She has tremor of the outstretched hands and with intention on the right, overall fairly mild today.    Follow up Instructions      -I discussed  the assessment and treatment plan with the patient. The patient was provided an opportunity to ask questions and all were answered. The patient agreed with the plan and demonstrated an understanding of the instructions.   The patient was advised to call back or seek an in-person evaluation if the symptoms worsen or if the condition fails to improve as anticipated.    Total time spent on today's visit was , including both face-to-face time and nonface-to-face time.  Time included that spent on review of records (prior notes available to me/labs/imaging if pertinent), discussing treatment and goals, answering patient's questions and coordinating care.   Kerin Salen, DO

## 2022-09-22 NOTE — Therapy (Signed)
OUTPATIENT OCCUPATIONAL THERAPY TREATMENT and D/C SUMMARY  Patient Name: April Church MRN: [id number] DOB:March 21, 1940, 82 y.o., female Today's Date: 09/22/2022  PCP: Lorenda Ishihara, MD  REFERRING PROVIDER: Vladimir Faster, DO   END OF SESSION:  OT End of Session - 09/22/22 1304     Visit Number 10    Number of Visits 11    Date for OT Re-Evaluation 09/27/22    Authorization Type Medicare    OT Start Time 1300    OT Stop Time 1330    OT Time Calculation (min) 30 min    Activity Tolerance Patient tolerated treatment well    Behavior During Therapy [facility] for tasks assessed/performed             Past Medical History:  Diagnosis Date   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 2011- no further tx. now- Dr. Donnie Coffin , Dr. Dayton Scrape   Cyst of finger 11/2011   annular cyst right long finger   Dental crowns present    Dermatitis    Frequency of urination    GERD (gastroesophageal reflux disease)    Hemorrhoid    History of colon polyps 2009   Also noted 2012 and 2017 by colonoscopy.   History of shingles 1971   Had right ischial recurrence in the March 2019   Hyperlipidemia    On atorvastatin   Hypertension    under control, has been on med. x 2 yrs.   Liposarcoma of left shoulder (HCC) 1969   Parkinson's disease    With mild tremor (neurologist Dr. Marjory Lies), neurosurgeon Dr. Lovell Sheehan   PONV (postoperative nausea and vomiting)    Seasonal allergies    Shingles of eyelid 1971   Right eyelid; recurrent -> last episode 05/11/2017   Tremors of nervous system    hands-essential tremor; associated with Parkinson's.   Trigger finger of right hand 11/2011   long finger   Past Surgical History:  Procedure Laterality Date   14-day Zio Patch Monitor  08/2020   (report to be scanned): Predominant SR w/ HR 61 to 142 bpm and  average 86 bpm.  Total of 59 episodes of PAT/PSVT  4 to 15 beats (not noted on diary).  Fastest was 5 beats at a rate of 193 bpm, longest was 15 beats at a rate of 106 bpm.  Rare isolated PACs (as well as couplets and triplets) with rare isolated PVCs.  No sustained arrhythmias or bradycardia to explain syncope.   ANTERIOR CERVICAL DECOMPRESSION/DISCECTOMY FUSION 4 LEVELS N/A 11/14/2013   Procedure: ANTERIOR CERVICAL DECOMPRESSION/DISCECTOMY FUSION 4 LEVELS;  Surgeon: Tressie Stalker, MD;  Location: [facility];  Service: Neurosurgery;  Laterality: N/A;  C34 C45 C56 C67 anterior cervical fusion with interbody prosthesis plating and  bonegraft   APPENDECTOMY  age 10   BREAST LUMPECTOMY  06/16/2009   left; SLN bx.   BREAST LUMPECTOMY  07/01/2009   re-excision   BREAST SURGERY  02/22/1997   reduction   CATARACT EXTRACTION, BILATERAL     COLONOSCOPY WITH PROPOFOL N/A 12/03/2014   Procedure: COLONOSCOPY WITH PROPOFOL;  Surgeon: Charolett Bumpers, MD;  Location: [facility];  Service: Endoscopy;  Laterality: N/A;   FOOT SURGERY  06/22/2011   left   KNEE ARTHROSCOPY  03/12/2005   right   KNEE ARTHROSCOPY     left  Lower Extremity Venous Dopplers  12/12/2020   No DVT bilaterally in the deep veins or superficial veins.  No deep or superficial venous reflux noted bilaterally with exception of Right SSV at the knee.   NASAL SINUS SURGERY     x 2   NEPHRECTOMY LIVING DONOR Left 04/22/2008   donated to spouse 2010([facility])   TRANSTHORACIC ECHOCARDIOGRAM  12/10/2020   EF 60 to 65%.  Normal LV size and function.  No heart WMA.  GR 1 DD.  Normal RV, RVP and RAP.[name]  Normal valves.   TRIGGER FINGER RELEASE  12/16/2011   Procedure: RELEASE TRIGGER FINGER/A-1 PULLEY;  Surgeon: Tami Ribas, MD;  Location: [facility];  Service: Orthopedics;  Laterality: Right;  RIGHT LONG FINGER TRIGGER RELEASE & ANNULAR CYST EXCISION   TUMOR EXCISION  age 1   right arm   Patient Active Problem List    Diagnosis Date Noted   Rectal bleeding 07/27/2022   Hemorrhoids 07/27/2022   Chronic pain of both knees 06/21/2022   Moderate persistent asthma without complication 03/27/2021   PSVT (paroxysmal supraventricular tachycardia) 11/26/2020   Syncope and collapse 11/24/2020   Primary osteoarthritis of right knee 05/12/2020   Primary osteoarthritis of left knee 05/12/2020   Hyperlipidemia    Bilateral calf pain 03/05/2019   Genetic testing 02/13/2019   Family history of ovarian cancer    Family history of bladder cancer    Family history of colon cancer    Family history of leukemia    Lumbar radiculopathy 12/08/2018   Myofascial pain 12/08/2018   Impaired gait and mobility 12/08/2018   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   Chronic nonallergic rhinitis 12/29/2016   Mild persistent asthma, uncomplicated 12/29/2016   Vocal fold paralysis, 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   Gastroesophageal reflux disease 12/29/2016   Cervical spondylosis with myelopathy and radiculopathy 11/14/2013   Essential tremor 06/13/2013   Cervical spondylosis without myelopathy 06/13/2013   Breast cancer of lower-outer quadrant of left female breast (HCC) 09/10/2010    ONSET DATE: 07/22/2022 (Date of referral)  REFERRING DIAG:  R26.81 (ICD-10-CM) - Gait instability  R25.1 (ICD-10-CM) - Tremor  R27.9 (ICD-10-CM) - Incoordination    THERAPY DIAG:  Unsteadiness on feet  Other lack of coordination  Rationale for Evaluation and Treatment: Rehabilitation  SUBJECTIVE:   SUBJECTIVE STATEMENT: The doctor called and reported I do NOT have Parkinsons.  No pain and no recent falls  Pt accompanied by: self  PERTINENT HISTORY: Hx of essential tremor and previously diagnosed with Parkinson's; however, she is to undergo further testing to see if it confirms this diagnosis. Pt referred to OP OT following Parkinson's screen due to concerns with UE coordination.   PRECAUTIONS: Fall  WEIGHT BEARING RESTRICTIONS: No  PAIN:   Are you having pain? Yes: NPRS scale: 3-5/10 Pain location: B knees  FALLS: Has patient fallen in last 6 months? No  LIVING ENVIRONMENT: Lives with: lives with their family and lives alone Lives in: House/apartment Stairs: Yes: Internal: 12-13 steps; can reach both and External: 3 steps; can reach both; she is able to live on main floor Has following equipment at home: Single point cane, Walker - 4 wheeled, shower chair, and Grab bars  PLOF: Independent; driving; retired Therapist, nutritional and Research officer, political party; reading; watching tv; visiting friends  PATIENT GOALS: to stay as independent as possible for as long as possible  OBJECTIVE:   HAND DOMINANCE: Right  ADLs: Overall ADLs: mod I  IADLs: Light housekeeping: hired help every 2 weeks  Handwriting: 75% legible and worse at end of day  MOBILITY STATUS: Independent and uses cane occassionally  POSTURE COMMENTS:  rounded shoulders  ACTIVITY TOLERANCE: Activity tolerance: good  FUNCTIONAL OUTCOME MEASURES: Fastening/unfastening 3 buttons: 36 Physical performance test: PPT#2 (simulated eating) 12 seconds & PPT#4 (donning/doffing jacket): 16  COORDINATION: 9 Hole Peg test: Right: 44 sec; Left: 32 sec  UE ROM:  WFL  UE MMT:   [facility]  08/02/2022 - Grip: R - 26.6 lbs; L - 27.1 lbs  SENSATION: WFL  MUSCLE TONE: RUE: Mild and Rigidity and LUE: Mild and Rigidity  COGNITION: Overall cognitive status: Within functional limits for tasks assessed  OBSERVATIONS: Dyskinesias and essential tremor   TODAY'S TREATMENT:                                                                                                                            Assessed remaining goal and progress to date - see below. Pt improved in coordination but did not quite meet goal.   MD confirmed pt does NOT have Parkinsons  Reviewed HEP's including Pen tricks and coordination HEP that were still appropriate - most all HEP's are still helpful (despite not having  PD)  Worked on dual tasking: tossing scarf while naming foods with letters of alphabet with min cues, mod difficulty. Pt encouraged to challenge herself cognitively and w/ dual tasking as long as safe, and gave examples of how she can do this at home.    PATIENT EDUCATION: Education details: keeping thinking skills sharp, memory strategies, button hook info Person educated: Patient Education method: Explanation, Demonstration, and Handouts Education comprehension: verbalized understanding and needs further education  HOME EXERCISE PROGRAM: 08/02/2022: PWR! Hands and prior HEPs 08/09/2022: Tremor compensatory strategies 08/11/22: writing strategies 08/16/22: Coordination HEP  08/18/22: Posture ex 08/23/22: 3 bag ex's 09/08/2022: Pen tricks 09/15/22: keeping thinking skills sharp, memory strategies  GOALS:  LONG TERM GOALS: Target date: 08/27/2022    Pt will be independent with HEP.  Baseline: not yet initiated Goal status: MET  2.  Pt will verbalize understanding of adapted strategies to maximize safety and independence with ADLs/IADLs.  Baseline: not yet initiated Goal status: MET  3.  Pt will demonstrate improved fine motor coordination for ADLs as evidenced by decreasing 9 hole peg test score for R hand by at least 10 secs  Baseline: 44 secs Goal status: NOT MET, but improved 09/15/22: 38 sec, 09/22/22 36 sec  5.  Pt will demonstrate improved ease with fastening buttons as evidenced by decreasing 3 button/unbutton time by at least 4 seconds  Baseline: 36 seconds (55 sec)  Goal status: NOT MET   6.  Pt will write a short paragraph with no significant decrease in size and maintain 90% legibility.  Baseline: 75% legibility Goal status: MET  7. Pt will demonstrate increased ease with dressing as evidenced by decreasing PPT#4 (don/ doff jacket) to 13 secs or less.  Baseline: 16 secs Goal status: MET (13  sec)      ASSESSMENT:  CLINICAL IMPRESSION: Pt has met 5/7 LTG's and  did improve on coordination Rt hand. Pt pleased with good news and with progress.   PERFORMANCE DEFICITS: in functional skills including ADLs, IADLs, coordination, strength, Fine motor control, mobility, and UE functional use  IMPAIRMENTS: are limiting patient from ADLs, IADLs, and leisure.   COMORBIDITIES:  may have co-morbidities  that affects occupational performance. Patient will benefit from skilled OT to address above impairments and improve overall function.  REHAB POTENTIAL: Good  PLAN:  OT FREQUENCY: 1x/week  OT DURATION: 4 additional weeks  PLANNED INTERVENTIONS: self care/ADL training, therapeutic exercise, therapeutic activity, functional mobility training, patient/family education, coping strategies training, DME and/or AE instructions, and Re-evaluation  RECOMMENDED OTHER SERVICES: none at this time  CONSULTED AND AGREED WITH PLAN OF CARE: Patient  PLAN D/C O.T.   OCCUPATIONAL THERAPY DISCHARGE SUMMARY  Visits from Start of Care: 10  Current functional level related to goals / functional outcomes: See above   Remaining deficits: Tremors Decreased memory and ability to perform dual taskings   Education / Equipment: See above   Patient agrees to discharge. Patient goals were partially met. Patient is being discharged due to being pleased with the current functional level.Sheran Lawless, OT 09/22/2022, 1:05 PM

## 2022-09-22 NOTE — Therapy (Signed)
OUTPATIENT PHYSICAL THERAPY NEURO TREATMENT/10th VISIT PN/DISCHARGE SUMMARY   Patient Name: April Church MRN: [id number] DOB:[birth year], 82 y.o., female Today's Date: 09/22/2022   PCP: Lorenda Ishihara, MD REFERRING PROVIDER: Vladimir Faster, DO  10th Visit Physical Therapy Progress Note  Dates of Reporting Period: 07/29/22 to 09/22/22     END OF SESSION:  PT End of Session - 09/22/22 1235     Visit Number 10    Number of Visits 13   Plus eval   Date for PT Re-Evaluation 10/06/22   Recert   Authorization Type Medicare A & B    PT Start Time 1233    PT Stop Time 1255   full time not used due to D/C visit   PT Time Calculation (min) 22 min    Equipment Utilized During Treatment Gait belt    Activity Tolerance Patient tolerated treatment well    Behavior During Therapy WFL for tasks assessed/performed               Past Medical History:  Diagnosis Date   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 2011- no further tx. now- Dr. Donnie Coffin , Dr. Dayton Scrape   Cyst of finger 11/2011   annular cyst right long finger   Dental crowns present    Dermatitis    Frequency of urination    GERD (gastroesophageal reflux disease)    Hemorrhoid    History of colon polyps 2009   Also noted 2012 and 2017 by colonoscopy.   History of shingles 1971   Had right ischial recurrence in the March 2019   Hyperlipidemia    On atorvastatin   Hypertension    under control, has been on med. x 2 yrs.   Liposarcoma of left shoulder (HCC) 1969   Parkinson's disease    With mild tremor (neurologist Dr. Marjory Lies), neurosurgeon Dr. Lovell Sheehan   PONV (postoperative nausea and vomiting)    Seasonal allergies    Shingles of eyelid 1971   Right eyelid; recurrent -> last episode 05/11/2017   Tremors of nervous system    hands-essential tremor; associated  with Parkinson's.   Trigger finger of right hand 11/2011   long finger   Past Surgical History:  Procedure Laterality Date   14-day Zio Patch Monitor  08/2020   (report to be scanned): Predominant SR w/ HR 61 to 142 bpm and average 86 bpm.  Total of 59 episodes of PAT/PSVT  4 to 15 beats (not noted on diary).  Fastest was 5 beats at a rate of 193 bpm, longest was 15 beats at a rate of 106 bpm.  Rare isolated PACs (as well as couplets and triplets) with rare isolated PVCs.  No sustained arrhythmias or bradycardia to explain syncope.   ANTERIOR CERVICAL DECOMPRESSION/DISCECTOMY FUSION 4 LEVELS N/A 11/14/2013   Procedure: ANTERIOR CERVICAL DECOMPRESSION/DISCECTOMY FUSION 4 LEVELS;  Surgeon: Tressie Stalker, MD;  Location: [facility];  Service: Neurosurgery;  Laterality: N/A;  C34 C45 C56 C67 anterior cervical fusion with interbody prosthesis plating and  bonegraft   APPENDECTOMY  age 6   BREAST LUMPECTOMY  06/16/2009   left; SLN bx.   BREAST LUMPECTOMY  07/01/2009   re-excision   BREAST SURGERY  02/22/1997   reduction   CATARACT EXTRACTION, BILATERAL     COLONOSCOPY WITH PROPOFOL N/A 12/03/2014   Procedure: COLONOSCOPY  WITH PROPOFOL;  Surgeon: Charolett Bumpers, MD;  Location: [facility];  Service: Endoscopy;  Laterality: N/A;   FOOT SURGERY  06/22/2011   left   KNEE ARTHROSCOPY  03/12/2005   right   KNEE ARTHROSCOPY     left   Lower Extremity Venous Dopplers  12/12/2020   No DVT bilaterally in the deep veins or superficial veins.  No deep or superficial venous reflux noted bilaterally with exception of Right SSV at the knee.   NASAL SINUS SURGERY     x 2   NEPHRECTOMY LIVING DONOR Left 04/22/2008   donated to spouse 2010([facility])   TRANSTHORACIC ECHOCARDIOGRAM  12/10/2020   EF 60 to 65%.  Normal LV size and function.  No heart WMA.  GR 1 DD.  Normal RV, RVP and RAP.[name]  Normal valves.   TRIGGER FINGER RELEASE  12/16/2011   Procedure: RELEASE TRIGGER FINGER/A-1 PULLEY;  Surgeon:  Tami Ribas, MD;  Location: [facility];  Service: Orthopedics;  Laterality: Right;  RIGHT LONG FINGER TRIGGER RELEASE & ANNULAR CYST EXCISION   TUMOR EXCISION  age 9   right arm   Patient Active Problem List   Diagnosis Date Noted   Rectal bleeding 07/27/2022   Hemorrhoids 07/27/2022   Chronic pain of both knees 06/21/2022   Moderate persistent asthma without complication 03/27/2021   PSVT (paroxysmal supraventricular tachycardia) 11/26/2020   Syncope and collapse 11/24/2020   Primary osteoarthritis of right knee 05/12/2020   Primary osteoarthritis of left knee 05/12/2020   Hyperlipidemia    Bilateral calf pain 03/05/2019   Genetic testing 02/13/2019   Family history of ovarian cancer    Family history of bladder cancer    Family history of colon cancer    Family history of leukemia    Lumbar radiculopathy 12/08/2018   Myofascial pain 12/08/2018   Impaired gait and mobility 12/08/2018   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   Chronic nonallergic rhinitis 12/29/2016   Mild persistent asthma, uncomplicated 12/29/2016   Vocal fold paralysis, 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   Gastroesophageal reflux disease 12/29/2016   Cervical spondylosis with myelopathy and radiculopathy 11/14/2013   Essential tremor 06/13/2013   Cervical spondylosis without myelopathy 06/13/2013   Breast cancer of lower-outer quadrant of left female breast (HCC) 09/10/2010    ONSET DATE: 07/22/2022 (referral)  REFERRING DIAG: R26.81 (ICD-10-CM) - Gait instability R25.1 (ICD-10-CM) - Tremor R27.9 (ICD-10-CM) - Incoordination  THERAPY DIAG:  Unsteadiness on feet  Abnormal posture  Other lack of coordination  Other abnormalities of gait and mobility  Rationale for Evaluation and Treatment: Rehabilitation  SUBJECTIVE:  SUBJECTIVE STATEMENT: Got the results back from the skin biopsy and does not have PD. Has a visit with Dr. Arbutus Leas on Friday. Forgot her cane today.  No falls. Balance has been off and on. Exercises are going well at home. Pt wishes to discharge.   Pt accompanied by: self  PERTINENT HISTORY: Acquired solitary kidney (04/2008), Asthma, Breast cancer (HCC) (05/2009), Cyst of finger (11/2011), Dental crowns present, Dermatitis, Frequency of urination, GERD (gastroesophageal reflux disease), Hemorrhoid, History of colon polyps (2009), History of shingles (1971), Hyperlipidemia, Hypertension, Liposarcoma of left shoulder (HCC) (1969), Parkinson's disease, PONV (postoperative nausea and vomiting), Seasonal allergies, Shingles of eyelid (1971), Tremors of nervous system, and Trigger finger of right hand (11/2011). here with essential tremor since 1990's. Now with intermittent resting tremor AND bradykinesia since 2016. May represent essential tremor with superimposed parkinson's disease + cervical myelopathy sequelae  PAIN:  Are you having pain? Yes, pt reports some knee pain   PRECAUTIONS: Fall  WEIGHT BEARING RESTRICTIONS: No  FALLS: Has patient fallen in last 6 months? No  LIVING ENVIRONMENT: Lives with: lives alone Lives in: House/apartment Stairs: Yes: Internal: full flight steps; bilateral but cannot reach both and External: 3 steps; on right going up, on left going up, and can reach both Has following equipment at home: Single point cane and Walker - 4 wheeled  PLOF: Independent  PATIENT GOALS: "Being able to control my balance better"  OBJECTIVE:   COGNITION: Overall cognitive status: Within functional limits for tasks assessed   SENSATION: Denies numbness/tingling in BUE/BLEs  COORDINATION: Heel to shin test: WNL bilaterally    POSTURE: rounded shoulders, forward head, and increased thoracic kyphosis  LOWER EXTREMITY ROM:     Active  Right Eval Left Eval  Hip flexion     Hip extension    Hip abduction    Hip adduction    Hip internal rotation    Hip external rotation    Knee flexion    Knee extension    Ankle dorsiflexion    Ankle plantarflexion    Ankle inversion    Ankle eversion     (Blank rows = not tested)  LOWER EXTREMITY MMT:  Tested in seated position   MMT Right Eval Left Eval  Hip flexion 4- 4  Hip extension    Hip abduction 4 4  Hip adduction 4 4  Hip internal rotation    Hip external rotation    Knee flexion 4- 4  Knee extension 4- 4  Ankle dorsiflexion 4 4  Ankle plantarflexion    Ankle inversion    Ankle eversion    (Blank rows = not tested)  BED MOBILITY:  Independent per pt   TRANSFERS: Assistive device utilized: None  Sit to stand: Modified independence Stand to sit: Modified independence    TODAY'S TREATMENT:             GAIT: Gait pattern: lateral gait deviations, step through pattern, decreased arm swing- Right, decreased arm swing- Left, decreased stride length, knee flexed in stance- Right, knee flexed in stance- Left, lateral hip instability, trunk flexed, and narrow BOS Distance walked: Various clinic distances  Assistive device utilized: no AD (pt left her SPC at home) Comments: Intermittent cues for sequencing and posture.  5x sit <> stand: 16 seconds first attempt, 15 seconds 2nd attempt  Gait speed: 11.37 seconds with no AD = 2.88 ft/sec    M-CTSIB  Condition 1: Firm Surface, EO 30 Sec, Normal Sway  Condition 2: Firm Surface, EC  30 Sec, Normal Sway  Condition 3: Foam Surface, EO 30 Sec, Normal Sway  Condition 4: Foam Surface, EC 30 Sec, Mild Sway    PATIENT EDUCATION: Education details: Discussed results of goals, continue with HEP for balance going forwards, D/C from PT and discussed that even though patient does not have PD, if pt notices balance changes in the future or 6 months, then can get a new referral to return to therapy  Person educated: Patient Education method:  Explanation Education comprehension: verbalized understanding  HOME EXERCISE PROGRAM: Access Code: [email or web address] URL: [email or web address] Date: 08/02/2022 Prepared by: Sherlie Ban  Exercises - Seated Hamstring Stretch  - 1 x daily - 5 x weekly - 1 sets - 3 reps - 30 sec hold - Sit to Stand Without Arm Support  - 1 x daily - 7 x weekly - 3 sets - 10 reps - Standing Balance with Eyes Closed on Foam  - 1 x daily - 5 x weekly - 3 sets - 30 hold - Romberg Stance on Foam Pad with Head Rotation  - 1 x daily - 5 x weekly - 2 sets - 10 reps - Walking with Head Rotation  - 1 x daily - 5 x weekly - 3 sets - Side to Side Weight Shift with Overhead Reach and Counter Support  - 1 x daily - 5 x weekly - 1 sets - 10 reps   PHYSICAL THERAPY DISCHARGE SUMMARY  Visits from Start of Care: 10  Current functional level related to goals / functional outcomes: See LTGs/clinical assessment statement   Remaining deficits: Gait abnormalities, impaired balance, decr functional strength   Education / Equipment: HEP, use of SPC for balance at all times   Patient agrees to discharge. Patient goals were partially met. Patient is being discharged due to being pleased with the current functional level. And pt requesting to D/C at this time    GOALS: Goals reviewed with patient? Yes  SHORT TERM GOALS: Target date: 08/26/2022   Pt will be independent with initial HEP for improved strength, balance, transfers and gait.  Baseline: to be reviewed from previous bout of therapy Goal status: PARTIALLY MET  2.  Pt will improve FGA to at least a 17/30 in order to demo decr fall risk. Baseline: 14/30; 19/30 on 08/23/22 Goal status: MET  3.  Pt will improve condition 4 of mCTSIB to at least 8 seconds in order to demo improved vestibular input for balance.  Baseline: 3-4 seconds   13 seconds on 08/23/22 Goal status: MET  4.  Pt will trial various assistive devices in clinic to determine safest option  for home use for fall prevention  Baseline: met - pt using cane  Goal status: MET  5.  Pt will improve gait velocity to at least 2.9 ft/s w/LRAD and SBA for improved gait efficiency and reduced fall risk   Baseline: 2.6 ft/s no AD  On 08/23/22: 23.3 seconds = 1.41 ft/sec with SPC 16.2 seconds = 2.0 ft/sec  Goal status: NOT MET  6.  Pt will teach back and demonstrate fall prevention techniques to implement at home and in community for reduced fall risk  Baseline: pt using SPC, provided fall prevention handout   Goal status: MET   LONG TERM GOALS: Target date: 09/29/2022 (updated to match cert date)     Pt will be independent with final HEP for improved strength, balance, transfers and gait.  Baseline:  Goal status: MET  2.  Pt will improve condition  4 of mCTSIB to at least 17 seconds in order to demo improved vestibular input for balance. Baseline: 3-4 seconds, 13 seconds   30 seconds on 09/22/22 Goal status: MET  3.  Pt will improve FGA to at least a 20/30 in order to demo decr fall risk. Baseline: 14/30; 20/30 (7/17) Goal status: MET  4.  Pt will improve gait velocity to at least 3.1 ft/s w/LRAD mod I for improved gait efficiency and independence  Baseline: 2.6 ft/s no AD  2.9 ft/sec with no AD on 09/22/22 Goal status: PARTIALLY MET  5.  Pt will improve 5 x STS to less than or equal to 14 w/o UE support seconds to demonstrate improved functional strength and transfer efficiency.   Baseline: 18.5s no UE support  15 seconds with no UE support  Goal status: PARTIALLY MET   ASSESSMENT:  CLINICAL IMPRESSION: 10th visit PN and D/C summary: Pt reporting that she does not have PD after skin biopsy and pt requesting to be discharged at this time. Assessed remainder of pt's goals with pt meeting or partially meeting her goals. Pt with improvement in FGA score, going from high > medium fall risk. Pt improved condition 4 of mCTSIB to 30 seconds, indicating improved vestibular  input for balance. Educated to use cane at all times for balance and stability during gait and to decr fall risk. Pt requesting to D/C from therapy at this time. Discussed in the future, pt can get a new referral to return to therapy if pt notices a change in her balance. Educated to continue to work on HEP to maintain gains in therapy.    OBJECTIVE IMPAIRMENTS: Abnormal gait, decreased activity tolerance, decreased balance, decreased coordination, decreased endurance, decreased knowledge of use of DME, decreased mobility, difficulty walking, decreased ROM, decreased strength, improper body mechanics, postural dysfunction, and pain  ACTIVITY LIMITATIONS: carrying, lifting, bending, squatting, stairs, transfers, and locomotion level  PARTICIPATION LIMITATIONS: shopping, community activity, yard work, and church  PERSONAL FACTORS: Age, Fitness, and 1 comorbidity: essential tremor  are also affecting patient's functional outcome.   REHAB POTENTIAL: Good  CLINICAL DECISION MAKING: Evolving/moderate complexity  EVALUATION COMPLEXITY: Moderate  PLAN:  PT FREQUENCY: 1-2x/week  PT DURATION: 6 weeks + 4 weeks (recert)   PLANNED INTERVENTIONS: Therapeutic exercises, Therapeutic activity, Neuromuscular re-education, Balance training, Gait training, Patient/Family education, Self Care, Joint mobilization, Stair training, Vestibular training, Canalith repositioning, DME instructions, Manual therapy, and Re-evaluation  PLAN FOR NEXT SESSION: D/C   Drake Leach, PT, DPT 09/22/2022, 1:06 PM

## 2022-09-24 ENCOUNTER — Telehealth: Payer: Self-pay | Admitting: Neurology

## 2022-09-24 DIAGNOSIS — R251 Tremor, unspecified: Secondary | ICD-10-CM

## 2022-09-24 DIAGNOSIS — G250 Essential tremor: Secondary | ICD-10-CM

## 2022-09-24 NOTE — Patient Instructions (Addendum)
Week 1 Decrease carbidopa/levodopa 25/100, 2 tablets at 7 AM, 2 tablets at 11 AM, 1 tablet at 4 PM Week 2 Decrease carbidopa/levodopa 25/100, 2 tablets at 7 AM, 1 tablet at 11 AM, 1 tablet at 4 PM Week 3 Decrease carbidopa/levodopa 25/100, 1 tablet at 7 AM, 1 tablet at 11 AM, 1 tablet at 4 PM Week 4 Decrease carbidopa/levodopa 25/100, 1 tablet at 7 AM and 11 AM Week 5 Decrease carbidopa/levodopa 25/100, 1 tablet at 7 AM Weeks 6 Stop carbidopa/levodopa

## 2022-09-29 ENCOUNTER — Encounter: Payer: Medicare Other | Admitting: Occupational Therapy

## 2022-09-29 ENCOUNTER — Ambulatory Visit: Payer: Medicare Other | Admitting: Physical Therapy

## 2022-09-30 ENCOUNTER — Ambulatory Visit: Payer: Medicare Other | Admitting: Allergy

## 2022-10-18 ENCOUNTER — Ambulatory Visit (AMBULATORY_SURGERY_CENTER): Payer: Medicare Other | Admitting: Internal Medicine

## 2022-10-18 ENCOUNTER — Encounter: Payer: Self-pay | Admitting: Internal Medicine

## 2022-10-18 VITALS — BP 159/82 | HR 71 | Temp 97.5°F | Resp 15 | Ht 60.0 in | Wt 125.0 lb

## 2022-10-18 DIAGNOSIS — K633 Ulcer of intestine: Secondary | ICD-10-CM

## 2022-10-18 DIAGNOSIS — K626 Ulcer of anus and rectum: Secondary | ICD-10-CM | POA: Diagnosis not present

## 2022-10-18 DIAGNOSIS — D123 Benign neoplasm of transverse colon: Secondary | ICD-10-CM | POA: Diagnosis not present

## 2022-10-18 DIAGNOSIS — J45909 Unspecified asthma, uncomplicated: Secondary | ICD-10-CM | POA: Diagnosis not present

## 2022-10-18 DIAGNOSIS — D120 Benign neoplasm of cecum: Secondary | ICD-10-CM | POA: Diagnosis not present

## 2022-10-18 DIAGNOSIS — K625 Hemorrhage of anus and rectum: Secondary | ICD-10-CM | POA: Diagnosis not present

## 2022-10-18 DIAGNOSIS — K635 Polyp of colon: Secondary | ICD-10-CM | POA: Diagnosis not present

## 2022-10-18 DIAGNOSIS — Z86010 Personal history of colonic polyps: Secondary | ICD-10-CM | POA: Diagnosis not present

## 2022-10-18 MED ORDER — SODIUM CHLORIDE 0.9 % IV SOLN: 500.0000 mL | Freq: Once | INTRAVENOUS | Status: DC

## 2022-10-18 NOTE — Progress Notes (Signed)
Called to room to assist during endoscopic procedure.  Patient ID and intended procedure confirmed with present staff. Received instructions for my participation in the procedure from the performing physician.  

## 2022-10-18 NOTE — Progress Notes (Unsigned)
GASTROENTEROLOGY PROCEDURE H&P NOTE   Primary Care Physician: Lorenda Ishihara, MD    Reason for Procedure:   Rectal bleeding  Plan:    Colonoscopy  Patient is appropriate for endoscopic procedure(s) in the ambulatory ([facility]) setting.  The nature of the procedure, as well as the risks, benefits, and alternatives were carefully and thoroughly reviewed with the patient. Ample time for discussion and questions allowed. The patient understood, was satisfied, and agreed to proceed.     HPI: April Church is a 82 y.o. female who presents for colonoscopy for evaluation of rectal bleeding .  Patient was most recently seen in the Gastroenterology Clinic on 09/13/22.  No interval change in medical history since that appointment. Please refer to that note for full details regarding GI history and clinical presentation.   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 2011- no further tx. now- Dr. Donnie Coffin , Dr. Dayton Scrape   Cyst of finger 11/2011   annular cyst right long finger   Dental crowns present    Dermatitis    Frequency of urination    GERD (gastroesophageal reflux disease)    Hemorrhoid    History of colon polyps 2009   Also noted 2012 and 2017 by colonoscopy.   History of shingles 1971   Had right ischial recurrence in the March 2019   Hyperlipidemia    On atorvastatin   Hypertension    under control, has been on med. x 2 yrs.   Liposarcoma of left shoulder (HCC) 1969   Parkinson's disease    With mild tremor (neurologist Dr. Marjory Lies), neurosurgeon Dr. Lovell Sheehan   PONV (postoperative nausea and vomiting)    Seasonal allergies    Shingles of eyelid 1971   Right eyelid; recurrent -> last episode 05/11/2017   Tremors of nervous system    hands-essential tremor; associated with Parkinson's.    Trigger finger of right hand 11/2011   long finger    Past Surgical History:  Procedure Laterality Date   14-day Zio Patch Monitor  08/2020   (report to be scanned): Predominant SR w/ HR 61 to 142 bpm and average 86 bpm.  Total of 59 episodes of PAT/PSVT  4 to 15 beats (not noted on diary).  Fastest was 5 beats at a rate of 193 bpm, longest was 15 beats at a rate of 106 bpm.  Rare isolated PACs (as well as couplets and triplets) with rare isolated PVCs.  No sustained arrhythmias or bradycardia to explain syncope.   ANTERIOR CERVICAL DECOMPRESSION/DISCECTOMY FUSION 4 LEVELS N/A 11/14/2013   Procedure: ANTERIOR CERVICAL DECOMPRESSION/DISCECTOMY FUSION 4 LEVELS;  Surgeon: Tressie Stalker, MD;  Location: [facility];  Service: Neurosurgery;  Laterality: N/A;  C34 C45 C56 C67 anterior cervical fusion with interbody prosthesis plating and  bonegraft   APPENDECTOMY  age 62   BREAST LUMPECTOMY  06/16/2009   left; SLN bx.   BREAST LUMPECTOMY  07/01/2009   re-excision   BREAST SURGERY  02/22/1997   reduction   CATARACT EXTRACTION, BILATERAL     COLONOSCOPY WITH PROPOFOL N/A 12/03/2014   Procedure: COLONOSCOPY WITH PROPOFOL;  Surgeon: Charolett Bumpers, MD;  Location: [facility];  Service: Endoscopy;  Laterality: N/A;   FOOT SURGERY  06/22/2011   left   KNEE ARTHROSCOPY  03/12/2005  right   KNEE ARTHROSCOPY     left   Lower Extremity Venous Dopplers  12/12/2020   No DVT bilaterally in the deep veins or superficial veins.  No deep or superficial venous reflux noted bilaterally with exception of Right SSV at the knee.   NASAL SINUS SURGERY     x 2   NEPHRECTOMY LIVING DONOR Left 04/22/2008   donated to spouse 2010([facility])   TRANSTHORACIC ECHOCARDIOGRAM  12/10/2020   EF 60 to 65%.  Normal LV size and function.  No heart WMA.  GR 1 DD.  Normal RV, RVP and RAP.[name]  Normal valves.   TRIGGER FINGER RELEASE  12/16/2011   Procedure: RELEASE TRIGGER FINGER/A-1 PULLEY;  Surgeon: Tami Ribas, MD;   Location: [facility];  Service: Orthopedics;  Laterality: Right;  RIGHT LONG FINGER TRIGGER RELEASE & ANNULAR CYST EXCISION   TUMOR EXCISION  age 69   right arm    Prior to Admission medications   Medication Sig Start Date End Date Taking? Authorizing Provider  albuterol (VENTOLIN HFA) 108 (90 Base) MCG/ACT inhaler Inhale 1-2 puffs into the lungs every 6 (six) hours as needed for wheezing or shortness of breath. 02/25/21   Nehemiah Settle, FNP  amLODipine (NORVASC) 2.5 MG tablet Take one tablet each day. Patient not taking: Reported on 09/20/2022 07/14/21   Marykay Lex, MD  atorvastatin (LIPITOR) 20 MG tablet Take 20 mg by mouth daily.    [provider]  budesonide-formoterol (SYMBICORT) 160-4.5 MCG/ACT inhaler Inhale 2 puffs into the lungs as needed.    [provider]  CALCIUM CARBONATE PO Take 1 tablet by mouth 2 (two) times daily.    [provider]  carbidopa-levodopa (SINEMET IR) 25-100 MG tablet Take 2 tablets by mouth 3 (three) times daily. 04/13/22 07/07/23  Penumalli, Glenford Bayley, MD  Cholecalciferol (VITAMIN D) 1000 UNITS capsule Take 1,000 Units by mouth 2 (two) times daily.    [provider]  cyclobenzaprine (FLEXERIL) 5 MG tablet TAKE 1 TO UP TO 2 TABLETS BY MOUTH ONCE DAILY AT BEDTIME AS NEEDED FOR MUSCLE SPASM 09/08/21   Lovorn, Aundra Millet, MD  entacapone (COMTAN) 200 MG tablet Take 1 tablet (200 mg total) by mouth 3 (three) times daily. 04/13/22   Penumalli, Glenford Bayley, MD  EPINEPHrine 0.3 mg/0.3 mL IJ SOAJ injection Inject 0.3 mg into the muscle as needed. 09/25/21   Marcelyn Bruins, MD  famotidine (PEPCID) 20 MG tablet Take 1 tablet by mouth once daily 04/28/22   Marcelyn Bruins, MD  fluticasone (FLOVENT HFA) 110 MCG/ACT inhaler 1 puff    [provider]  hydrocortisone (ANUSOL-HC) 2.5 % rectal cream Procto-Med HC 2.5 % topical cream perineal applicator  APPLY CREAM RECTALLY TO AFFECTED AREA TWICE DAILY FOR 7  DAYS 03/25/22   Imogene Burn, MD  ipratropium (ATROVENT) 0.03 % nasal spray USE 2 SPRAY(S) IN EACH NOSTRIL TWICE DAILY 06/23/22   Marcelyn Bruins, MD  ketotifen (ZADITOR) 0.025 % ophthalmic solution Apply 1 drop to eye daily.    [provider]  levocetirizine (XYZAL) 5 MG tablet TAKE 1 TABLET BY MOUTH ONCE DAILY IN THE EVENING . APPOINTMENT REQUIRED FOR FUTURE REFILLS 04/05/22   Marcelyn Bruins, MD  metoprolol succinate (TOPROL XL) 25 MG 24 hr tablet Take 0.5 tablets (12.5 mg total) by mouth daily. Patient not taking: Reported on 09/20/2022 09/01/22   Jodelle Gross, NP  metoprolol succinate (TOPROL-XL) 25 MG 24 hr tablet Take 1 tablet (25  mg total) by mouth at bedtime. Patient not taking: Reported on 09/20/2022 07/14/21   Marykay Lex, MD  metoprolol succinate (TOPROL-XL) 25 MG 24 hr tablet Take 12.5 mg by mouth daily. Take 1/2 Tablet In The Morning    [provider]  Na Sulfate-K Sulfate-Mg Sulf 17.5-3.13-1.6 GM/177ML SOLN Use as directed; may use generic; goodrx card if insurance will not cover generic 09/13/22   Imogene Burn, MD  nystatin ointment (MYCOSTATIN) Apply 1 Application topically 2 (two) times daily.    [provider]  Polyethyl Glycol-Propyl Glycol (SYSTANE OP) Place 1 drop into both eyes 2 (two) times daily.    [provider]  primidone (MYSOLINE) 50 MG tablet Take 2 tablets (100 mg total) by mouth 3 (three) times daily. 10/13/21   Penumalli, Glenford Bayley, MD  traMADol (ULTRAM) 50 MG tablet TAKE 1 TABLET BY MOUTH EVERY 12 HOURS AS NEEDED 06/29/22   Lovorn, Aundra Millet, MD  triamcinolone ointment (KENALOG) 0.1 % Apply 1 Application topically as needed.    [provider]  venlafaxine (EFFEXOR) 75 MG tablet Take 1 tablet (75 mg total) by mouth daily. 09/12/20   Genice Rouge, MD    Current Outpatient Medications  Medication Sig Dispense Refill   albuterol (VENTOLIN HFA) 108 (90 Base) MCG/ACT inhaler Inhale 1-2 puffs into the  lungs every 6 (six) hours as needed for wheezing or shortness of breath. 18 g 1   amLODipine (NORVASC) 2.5 MG tablet Take one tablet each day. (Patient not taking: Reported on 09/20/2022) 90 tablet 3   atorvastatin (LIPITOR) 20 MG tablet Take 20 mg by mouth daily.     budesonide-formoterol (SYMBICORT) 160-4.5 MCG/ACT inhaler Inhale 2 puffs into the lungs as needed.     CALCIUM CARBONATE PO Take 1 tablet by mouth 2 (two) times daily.     carbidopa-levodopa (SINEMET IR) 25-100 MG tablet Take 2 tablets by mouth 3 (three) times daily. 540 tablet 4   Cholecalciferol (VITAMIN D) 1000 UNITS capsule Take 1,000 Units by mouth 2 (two) times daily.     cyclobenzaprine (FLEXERIL) 5 MG tablet TAKE 1 TO UP TO 2 TABLETS BY MOUTH ONCE DAILY AT BEDTIME AS NEEDED FOR MUSCLE SPASM 50 tablet 11   entacapone (COMTAN) 200 MG tablet Take 1 tablet (200 mg total) by mouth 3 (three) times daily. 90 tablet 12   EPINEPHrine 0.3 mg/0.3 mL IJ SOAJ injection Inject 0.3 mg into the muscle as needed. 2 each 1   famotidine (PEPCID) 20 MG tablet Take 1 tablet by mouth once daily 30 tablet 5   fluticasone (FLOVENT HFA) 110 MCG/ACT inhaler 1 puff     hydrocortisone (ANUSOL-HC) 2.5 % rectal cream Procto-Med HC 2.5 % topical cream perineal applicator  APPLY CREAM RECTALLY TO AFFECTED AREA TWICE DAILY FOR 7 DAYS 30 g 1   ipratropium (ATROVENT) 0.03 % nasal spray USE 2 SPRAY(S) IN EACH NOSTRIL TWICE DAILY 30 mL 5   ketotifen (ZADITOR) 0.025 % ophthalmic solution Apply 1 drop to eye daily.     levocetirizine (XYZAL) 5 MG tablet TAKE 1 TABLET BY MOUTH ONCE DAILY IN THE EVENING . APPOINTMENT REQUIRED FOR FUTURE REFILLS 30 tablet 0   metoprolol succinate (TOPROL XL) 25 MG 24 hr tablet Take 0.5 tablets (12.5 mg total) by mouth daily. (Patient not taking: Reported on 09/20/2022) 30 tablet 5   metoprolol succinate (TOPROL-XL) 25 MG 24 hr tablet Take 1 tablet (25 mg total) by mouth at bedtime. (Patient not taking: Reported on 09/20/2022) 90 tablet  3    metoprolol succinate (TOPROL-XL) 25 MG 24 hr tablet Take 12.5 mg by mouth daily. Take 1/2 Tablet In The Morning     Na Sulfate-K Sulfate-Mg Sulf 17.5-3.13-1.6 GM/177ML SOLN Use as directed; may use generic; goodrx card if insurance will not cover generic 9912 each 0   nystatin ointment (MYCOSTATIN) Apply 1 Application topically 2 (two) times daily.     Polyethyl Glycol-Propyl Glycol (SYSTANE OP) Place 1 drop into both eyes 2 (two) times daily.     primidone (MYSOLINE) 50 MG tablet Take 2 tablets (100 mg total) by mouth 3 (three) times daily. 540 tablet 4   traMADol (ULTRAM) 50 MG tablet TAKE 1 TABLET BY MOUTH EVERY 12 HOURS AS NEEDED 20 tablet 0   triamcinolone ointment (KENALOG) 0.1 % Apply 1 Application topically as needed.     venlafaxine (EFFEXOR) 75 MG tablet Take 1 tablet (75 mg total) by mouth daily. 90 tablet 1   Current Facility-Administered Medications  Medication Dose Route Frequency Provider Last Rate Last Admin   0.9 %  sodium chloride infusion  500 mL Intravenous Once Imogene Burn, MD        Allergies as of 10/18/2022 - Review Complete 10/18/2022  Allergen Reaction Noted   Shellfish allergy Shortness Of Breath 12/14/2011   Duloxetine Itching and Rash 05/13/2015   Sulfa drugs cross reactors Anxiety and Rash 07/30/2010   Lisinopril  02/05/2020   Lyrica [pregabalin] Other (See Comments) 01/09/2019   Other  02/05/2020   Sulfa antibiotics Other (See Comments) 02/05/2020   Codeine Anxiety 07/30/2010    Family History  Problem Relation Age of Onset   Kidney disease Mother    Stroke Father    Stroke Sister        08/2015   Diabetes Brother    Heart disease Brother    Ovarian cancer Sister 41   Heart disease Paternal Grandmother    Heart disease Paternal Grandfather    Leukemia Other 15       brother's grandson   Bladder Cancer Brother 59   Colon cancer Niece        dx in late 69s    Social History   Socioeconomic History   Marital status: Widowed    Spouse  name: [name]-David   Number of children: 1   Years of education: HS   Highest education level: Not on file  Occupational History   Occupation: Retired   Occupation: Housewife  Tobacco Use   Smoking status: Never   Smokeless tobacco: Never  Vaping Use   Vaping status: Never Used  Substance and Sexual Activity   Alcohol use: No    Alcohol/week: 0.0 standard drinks of alcohol   Drug use: No   Sexual activity: Not Currently  Other Topics Concern   Not on file  Social History Narrative   10/13/20    Recently widowed-lives alone, husband [name]) passed 04/26/2020    Patient has 1 daughter who lives in [place], [place]-> 1 grandson who lives in [place]   Patient has a HS education.    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    Patient is right handed.    Patient drinks caffeine occasionally.     Social Determinants of Health   Financial Resource Strain: Not on file  Food Insecurity: Not on file  Transportation Needs: Not on file  Physical Activity: Not on file  Stress: Not on file  Social Connections: Not on file  Intimate Partner Violence: Not on file    Physical Exam: Vital signs in last 24 hours: BP (!) 148/108   Pulse (!) 108   Temp (!) 97.5 F (36.4 C) (Other (Comment)) Comment (Src): forehead  Ht 5' (1.524 m)   Wt 125 lb (56.7 kg)   SpO2 95%   BMI 24.41 kg/m  GEN: NAD EYE: Sclerae anicteric ENT: MMM CV: Non-tachycardic Pulm: No increased WOB GI: Soft NEURO:  Alert & Oriented   Eulah Pont, MD  Gastroenterology   10/18/2022 3:13 PM

## 2022-10-18 NOTE — Patient Instructions (Signed)
Resume all of your previous medications today as ordered.  You have an appointment for a hemorrhoidal banding on 12/03/22 at 3:40.  Go to the 3rd floor 15 minutes early.  Read all of the handouts given to you by your recovery room nurse.  YOU HAD AN ENDOSCOPIC PROCEDURE TODAY AT THE [facility]:   Refer to the procedure report that was given to you for any specific questions about what was found during the examination.  If the procedure report does not answer your questions, please call your gastroenterologist to clarify.  If you requested that your care partner not be given the details of your procedure findings, then the procedure report has been included in a sealed envelope for you to review at your convenience later.  YOU SHOULD EXPECT: Some feelings of bloating in the abdomen. Passage of more gas than usual.  Walking can help get rid of the air that was put into your GI tract during the procedure and reduce the bloating. If you had a lower endoscopy (such as a colonoscopy or flexible sigmoidoscopy) you may notice spotting of blood in your stool or on the toilet paper. If you underwent a bowel prep for your procedure, you may not have a normal bowel movement for a few days.  Please Note:  You might notice some irritation and congestion in your nose or some drainage.  This is from the oxygen used during your procedure.  There is no need for concern and it should clear up in a day or so.  SYMPTOMS TO REPORT IMMEDIATELY:  Following lower endoscopy (colonoscopy or flexible sigmoidoscopy):  Excessive amounts of blood in the stool  Significant tenderness or worsening of abdominal pains  Swelling of the abdomen that is new, acute  Fever of 100F or higher    For urgent or emergent issues, a gastroenterologist can be reached at any hour by calling [phone]. Do not use MyChart messaging for urgent concerns.    DIET:  We do recommend a small meal at first, but then you may proceed  to your regular diet.  Drink plenty of fluids but you should avoid alcoholic beverages for 24 hours. Try to eat plenty of fiber and drink a lot of water.  ACTIVITY:  You should plan to take it easy for the rest of today and you should NOT DRIVE or use heavy machinery until tomorrow (because of the sedation medicines used during the test).    FOLLOW UP: Our staff will call the number listed on your records the next business day following your procedure.  We will call around 7:15- 8:00 am to check on you and address any questions or concerns that you may have regarding the information given to you following your procedure. If we do not reach you, we will leave a message.     If any biopsies were taken you will be contacted by phone or by letter within the next 1-3 weeks.  Please call us at [phone] if you have not heard about the biopsies in 3 weeks.    SIGNATURES/CONFIDENTIALITY: You and/or your care partner have signed paperwork which will be entered into your electronic medical record.  These signatures attest to the fact that that the information above on your After Visit Summary has been reviewed and is understood.  Full responsibility of the confidentiality of this discharge information lies with you and/or your care-partner.

## 2022-10-18 NOTE — Progress Notes (Signed)
Pt resting comfortably. VSS. Airway intact. SBAR complete to RN. All questions answered.   

## 2022-10-18 NOTE — Progress Notes (Unsigned)
Dr Leonides Schanz requested that I schedule a hemorrhoidal banding in a follow-up slot.  IT was done per Dr Leonides Schanz.

## 2022-10-18 NOTE — Op Note (Addendum)
[facility] Patient Name: April Church Procedure Date: 10/18/2022 3:25 PM MRN: [id number] Endoscopist: Madelyn Brunner [place] , , [id number] Age: 82 Referring MD:  Date of Birth: Dec 22, 1940 Gender: Female Account #: [id number] Procedure:                Colonoscopy Indications:              Rectal bleeding Medicines:                Monitored Anesthesia Care Procedure:                Pre-Anesthesia Assessment:                           - Prior to the procedure, a History and Physical                            was performed, and patient medications and                            allergies were reviewed. The patient's tolerance of                            previous anesthesia was also reviewed. The risks                            and benefits of the procedure and the sedation                            options and risks were discussed with the patient.                            All questions were answered, and informed consent                            was obtained. Prior Anticoagulants: The patient has                            taken no anticoagulant or antiplatelet agents. ASA                            Grade Assessment: III - A patient with severe                            systemic disease. After reviewing the risks and                            benefits, the patient was deemed in satisfactory                            condition to undergo the procedure.                           After obtaining informed consent, the colonoscope  was passed under direct vision. Throughout the                            procedure, the patient's blood pressure, pulse, and                            oxygen saturations were monitored continuously. The                            CF [id number] #[id number] was introduced through the anus                            and advanced to the the terminal ileum. The                            colonoscopy was performed without  difficulty. The                            patient tolerated the procedure well. The quality                            of the bowel preparation was good. The terminal                            ileum, ileocecal valve, appendiceal orifice, and                            rectum were photographed. Scope In: 3:35:32 PM Scope Out: 3:56:09 PM Scope Withdrawal Time: 0 hours 12 minutes 44 seconds  Total Procedure Duration: 0 hours 20 minutes 37 seconds  Findings:                 The terminal ileum appeared normal.                           Two sessile polyps were found in the transverse                            colon and cecum. The polyps were 3 to 6 mm in size.                            These polyps were removed with a cold snare.                            Resection and retrieval were complete.                           A single (solitary) ten mm ulcer was found in the                            rectum. No bleeding was present. No stigmata of                            recent bleeding  were seen. Biopsies were taken with                            a cold forceps for histology.                           Non-bleeding internal hemorrhoids were found during                            retroflexion. Complications:            No immediate complications. Estimated Blood Loss:     Estimated blood loss was minimal. Impression:               - The examined portion of the ileum was normal.                           - Two 3 to 6 mm polyps in the transverse colon and                            in the cecum, removed with a cold snare. Resected                            and retrieved.                           - A single (solitary) ulcer in the rectum. Biopsied.                           - Non-bleeding internal hemorrhoids. Recommendation:           - Discharge patient to home (with escort).                           - Await pathology results.                           - Will schedule follow up for  hemorrhoidal banding.                           - The findings and recommendations were discussed                            with the patient. Dr Particia Lather "[place]" [place],  10/18/2022 4:02:04 PM

## 2022-10-19 ENCOUNTER — Encounter: Payer: Self-pay | Admitting: Neurology

## 2022-10-19 ENCOUNTER — Telehealth: Payer: Self-pay

## 2022-10-19 NOTE — Telephone Encounter (Signed)
  Follow up Call-     10/18/2022    3:11 PM  Call back number  Post procedure Call Back phone  # [phone]  Permission to leave phone message Yes     Patient questions:  Do you have a fever, pain , or abdominal swelling? No. Pain Score  0 *  Have you tolerated food without any problems? Yes.    Have you been able to return to your normal activities? Yes.    Do you have any questions about your discharge instructions: Diet   No. Medications  No. Follow up visit  No.  Do you have questions or concerns about your Care? No.  Actions: * If pain score is 4 or above: No action needed, pain <4.

## 2022-10-20 DIAGNOSIS — Z23 Encounter for immunization: Secondary | ICD-10-CM | POA: Diagnosis not present

## 2022-10-22 ENCOUNTER — Encounter: Payer: Self-pay | Admitting: Internal Medicine

## 2022-10-29 DIAGNOSIS — R300 Dysuria: Secondary | ICD-10-CM | POA: Diagnosis not present

## 2022-10-29 DIAGNOSIS — K626 Ulcer of anus and rectum: Secondary | ICD-10-CM | POA: Diagnosis not present

## 2022-11-03 ENCOUNTER — Encounter: Payer: Self-pay | Admitting: Allergy

## 2022-11-03 ENCOUNTER — Other Ambulatory Visit: Payer: Self-pay

## 2022-11-03 ENCOUNTER — Ambulatory Visit (INDEPENDENT_AMBULATORY_CARE_PROVIDER_SITE_OTHER): Payer: Medicare Other | Admitting: Allergy

## 2022-11-03 VITALS — BP 112/76 | HR 74 | Temp 97.8°F | Resp 15

## 2022-11-03 DIAGNOSIS — J4540 Moderate persistent asthma, uncomplicated: Secondary | ICD-10-CM

## 2022-11-03 DIAGNOSIS — K219 Gastro-esophageal reflux disease without esophagitis: Secondary | ICD-10-CM | POA: Diagnosis not present

## 2022-11-03 DIAGNOSIS — T781XXD Other adverse food reactions, not elsewhere classified, subsequent encounter: Secondary | ICD-10-CM

## 2022-11-03 DIAGNOSIS — J3802 Paralysis of vocal cords and larynx, bilateral: Secondary | ICD-10-CM

## 2022-11-03 DIAGNOSIS — J310 Chronic rhinitis: Secondary | ICD-10-CM | POA: Diagnosis not present

## 2022-11-03 MED ORDER — BUDESONIDE-FORMOTEROL FUMARATE 160-4.5 MCG/ACT IN AERO: 2.0000 | INHALATION_SPRAY | RESPIRATORY_TRACT | 5 refills | Status: DC | PRN

## 2022-11-03 MED ORDER — ALBUTEROL SULFATE HFA 108 (90 BASE) MCG/ACT IN AERS: 1.0000 | INHALATION_SPRAY | Freq: Four times a day (QID) | RESPIRATORY_TRACT | 1 refills | Status: DC | PRN

## 2022-11-03 MED ORDER — FAMOTIDINE 20 MG PO TABS: 20.0000 mg | ORAL_TABLET | Freq: Every day | ORAL | 5 refills | Status: DC

## 2022-11-03 NOTE — Progress Notes (Signed)
Follow-up Note  RE: CHARO ZARR MRN: [id number] DOB: [birth year] Date of Office Visit: 11/03/2022   History of present illness: April Church is a 82 y.o. female presenting today for follow-up of asthma, nonallergic rhinitis, vocal fold paralysis, GERD, food allergy.  She was last seen in the office on 04/01/2022 by myself. She reports having a lot of runny nose and sneezing in the mornings over the past month.  Does not feel the Xyzal is effective any longer.  She states the nasal atrovent does help in a short period of time after use.  She does use the nasal atrovent in the mornings.  Sometimes will use before bed and uses 1 spray each nostril.   She does not report need of albuterol for symptom relief.  She does continue on Symbicort 2 puffs twice a day with spacer device.  She denies ED/UC visits or systemic steroid needs for asthma symptoms since last visit.  She continues to take famotidine for reflux control and is still effective.  She is considering no longer doing the Botox injections for her vocal cord paralysis as she loses her voice for about a month after injections and once it does return does not seem to be any better between the injections that she thinks  she may just see what happens without it.  She does follow with Dr Delford Field in ENT for this.  She does continue to avoid shellfish and wine as has had not need for epinephrine device.    Review of systems: 10pt ROS negative unless noted above in HPI  Past medical/social/surgical/family history have been reviewed and are unchanged unless specifically indicated below.  No changes  Medication List: Current Outpatient Medications  Medication Sig Dispense Refill   atorvastatin (LIPITOR) 20 MG tablet Take 20 mg by mouth daily.     budesonide-formoterol (SYMBICORT) 160-4.5 MCG/ACT inhaler Inhale 2 puffs into the lungs as needed.     CALCIUM CARBONATE PO Take 1 tablet by mouth 2 (two) times daily.      Cholecalciferol (VITAMIN D) 1000 UNITS capsule Take 1,000 Units by mouth 2 (two) times daily.     cyclobenzaprine (FLEXERIL) 5 MG tablet TAKE 1 TO UP TO 2 TABLETS BY MOUTH ONCE DAILY AT BEDTIME AS NEEDED FOR MUSCLE SPASM 50 tablet 11   entacapone (COMTAN) 200 MG tablet Take 1 tablet (200 mg total) by mouth 3 (three) times daily. 90 tablet 12   EPINEPHrine 0.3 mg/0.3 mL IJ SOAJ injection Inject 0.3 mg into the muscle as needed. 2 each 1   fluticasone (FLOVENT HFA) 110 MCG/ACT inhaler 1 puff     ipratropium (ATROVENT) 0.03 % nasal spray USE 2 SPRAY(S) IN EACH NOSTRIL TWICE DAILY 30 mL 5   ketotifen (ZADITOR) 0.025 % ophthalmic solution Apply 1 drop to eye daily.     levocetirizine (XYZAL) 5 MG tablet TAKE 1 TABLET BY MOUTH ONCE DAILY IN THE EVENING . APPOINTMENT REQUIRED FOR FUTURE REFILLS 30 tablet 0   metoprolol succinate (TOPROL-XL) 25 MG 24 hr tablet Take 12.5 mg by mouth daily. Take 1/2 Tablet In The Morning     Na Sulfate-K Sulfate-Mg Sulf 17.5-3.13-1.6 GM/177ML SOLN Use as directed; may use generic; goodrx card if insurance will not cover generic 9912 each 0   nystatin ointment (MYCOSTATIN) Apply 1 Application topically 2 (two) times daily.     Polyethyl Glycol-Propyl Glycol (SYSTANE OP) Place 1 drop into both eyes 2 (two) times daily.     primidone (MYSOLINE) 50  MG tablet Take 2 tablets (100 mg total) by mouth 3 (three) times daily. 540 tablet 4   triamcinolone ointment (KENALOG) 0.1 % Apply 1 Application topically as needed.     venlafaxine (EFFEXOR) 75 MG tablet Take 1 tablet (75 mg total) by mouth daily. 90 tablet 1   albuterol (VENTOLIN HFA) 108 (90 Base) MCG/ACT inhaler Inhale 1-2 puffs into the lungs every 6 (six) hours as needed for wheezing or shortness of breath. 18 g 1   amLODipine (NORVASC) 2.5 MG tablet Take one tablet each day. (Patient not taking: Reported on 09/20/2022) 90 tablet 3   carbidopa-levodopa (SINEMET IR) 25-100 MG tablet Take 2 tablets by mouth 3 (three) times daily.  (Patient not taking: Reported on 11/03/2022) 540 tablet 4   famotidine (PEPCID) 20 MG tablet Take 1 tablet (20 mg total) by mouth daily. 30 tablet 5   hydrocortisone (ANUSOL-HC) 2.5 % rectal cream Procto-Med HC 2.5 % topical cream perineal applicator  APPLY CREAM RECTALLY TO AFFECTED AREA TWICE DAILY FOR 7 DAYS (Patient not taking: Reported on 11/03/2022) 30 g 1   metoprolol succinate (TOPROL XL) 25 MG 24 hr tablet Take 0.5 tablets (12.5 mg total) by mouth daily. (Patient not taking: Reported on 09/20/2022) 30 tablet 5   metoprolol succinate (TOPROL-XL) 25 MG 24 hr tablet Take 1 tablet (25 mg total) by mouth at bedtime. (Patient not taking: Reported on 09/20/2022) 90 tablet 3   traMADol (ULTRAM) 50 MG tablet TAKE 1 TABLET BY MOUTH EVERY 12 HOURS AS NEEDED 20 tablet 0   No current facility-administered medications for this visit.     Known medication allergies: Allergies  Allergen Reactions   Shellfish Allergy Shortness Of Breath   Duloxetine Itching and Rash   Sulfa Drugs Cross Reactors Anxiety and Rash   Lisinopril     Other reaction(s): cough Other reaction(s): cough Other reaction(s): cough   Lyrica [Pregabalin] Other (See Comments)    "made me drunk, couldn't function"   Other     Other reaction(s): hyper   Sulfa Antibiotics Other (See Comments)    Other reaction(s): hyper, break out in a rash Other reaction(s): hyper, break out in a rash   Codeine Anxiety    Other reaction(s): hyper Other reaction(s): hyper     Physical examination: Blood pressure 112/76, pulse 74, temperature 97.8 F (36.6 C), temperature source Temporal, resp. rate 15, SpO2 97%.  General: Alert, interactive, in no acute distress. HEENT: PERRLA, TMs pearly gray, turbinates minimally edematous without discharge, post-pharynx non erythematous. Neck: Supple without lymphadenopathy. Lungs: Clear to auscultation without wheezing, rhonchi or rales. {no increased work of breathing. CV: Normal S1, S2 without  murmurs. Abdomen: Nondistended, nontender. Skin: Warm and dry, without lesions or rashes. Extremities:  No clubbing, cyanosis or edema. Neuro:   Grossly intact.  Diagnositics/Labs: Spirometry: FEV1: 1.15L 71%, FVC: 1.41L 66% predicted.  FEV1 is normal for age but FVC is slightly reduced  Assessment and plan: Asthma Under good control at this time Continue Symbicort 160/4.5 mcg  2 puffs twice a day with spacer to help prevent cough and wheeze.  Continue albuterol 2 puff into the lungs every 4-6 hours as needed for shortness of breath or wheezing Have access to albuterol inhaler 2 puffs every 4-6 hours as needed for cough/wheeze/shortness of breath/chest tightness.  May use 15-20 minutes prior to activity.   Monitor frequency of use.    Chronic nonallergic rhinitis Change Xyzal to Allegra 180mg  daily at this time.  May need to rotate between  Allegra and Xyzal every 3-6 months to maintain efficacy.  Continue Atrovent 0.3% use 2 sprays in each nostril twice a day right nose for a runny nose control. With using nasal sprays point tip of bottle toward eye on same side nostril and lean head slightly forward for best technique.   Consider saline nasal rinses as needed for nasal symptoms. Use this before any medicated nasal sprays for best result  Vocal fold paralysis, bilateral Continue routine follow-up with Dr. Delford Field with ENT   GERD Continue famotidine 20 mg once a day as you have been Continue dietary and lifestyle modifications  Food allergy Continue avoidance of shellfish and wine  In case of an allergic reaction, take Benadryl 50 mg every 4 hours, and if life-threatening symptoms occur, inject with EpiPen 0.3 mg.  Follow up in 6 months or sooner if needed   I appreciate the opportunity to take part in Chloie's care. Please do not hesitate to contact me with questions.  Sincerely,   Margo Aye, MD Allergy/Immunology [facility]

## 2022-11-03 NOTE — Patient Instructions (Addendum)
Asthma Under good control at this time Continue Symbicort 160/4.5 mcg  2 puffs twice a day with spacer to help prevent cough and wheeze.  Continue albuterol 2 puff into the lungs every 4-6 hours as needed for shortness of breath or wheezing Have access to albuterol inhaler 2 puffs every 4-6 hours as needed for cough/wheeze/shortness of breath/chest tightness.  May use 15-20 minutes prior to activity.   Monitor frequency of use.    Chronic nonallergic rhinitis Change Xyzal to Allegra 180mg  daily at this time.  May need to rotate between Allegra and Xyzal every 3-6 months to maintain efficacy.  Continue Atrovent 0.3% use 2 sprays in each nostril twice a day right nose for a runny nose control. With using nasal sprays point tip of bottle toward eye on same side nostril and lean head slightly forward for best technique.   Consider saline nasal rinses as needed for nasal symptoms. Use this before any medicated nasal sprays for best result  Vocal fold paralysis, bilateral Continue routine follow-up with Dr. Delford Field with ENT   GERD Continue famotidine 20 mg once a day as you have been Continue dietary and lifestyle modifications  Food allergy Continue avoidance of shellfish and wine  In case of an allergic reaction, take Benadryl 50 mg every 4 hours, and if life-threatening symptoms occur, inject with EpiPen 0.3 mg.  Follow up in 6 months or sooner if needed

## 2022-11-16 NOTE — Progress Notes (Unsigned)
Assessment/Plan:   1.  Tremor  -Patient previously saw Dr. Marjory Lies and Dr. Rubin Payor             -Patient with longstanding history of essential tremor             -Patient's skin biopsy was negative for alpha-synuclein             -Patient taken off of levodopa today and examined.  I am not convinced that all of the diagnosis of Parkinson's disease.  I told her to go ahead and stay off of it.  I think she has longstanding essential tremor that has resulted in some rest tremor.             -Patient had DaTscan at [facility] in February, 2020 and it stated that radiotracer uptake was mildly decreased bilaterally, but felt to be 2 standard deviations within age-matched controls.             -Records indicate that Dr. Rubin Payor did on/off testing in February, 2020 and felt that levodopa helped tremor but did not help bradykinesia (UPDRS off score was 40 and on score was still 31).  DBS was offered and declined at that time because patient's husband was ill.  -discussed dbs and focused ultrasound today.  Focused ultrasound may be the better option for her, given age but balance would be a concern  she is interested but wants to discuss with her daughter.  Information on focused ultrasound given             -Neurocognitive testing at [facility] February, 2020 with evidence of MCI, but concerns for developing Alzheimer's.  I did not see evidence of that today  -increase primidone slowly to 50 mg, 3/3/2   2.  Spasmodic dysphonia             -Doing B[facility] at [facility] and has been doing this since 2017   3.  Hemifacial spasm, left             -Not bothersome to patient.  She has a little bit of blepharospasm as well   Subjective:   April Church was seen today in follow up for tremor and a historical diagnosis of possible Parkinson's disease.  I have only seen her a few times, but was not convinced of the diagnosis.  However, I also saw her when she was on levodopa.  She  subsequently had a skin biopsy that was negative for alpha-synuclein.  When I saw her last on August 2, I gave her a schedule to wean off of her levodopa.  She is off of that and entacapone.  She has been off of it for over a month and noted no difference being off of the medication.  She is frustrated with her tremor.    Current prescribed movement disorder medications: primidone 50 mg, 2 po  mg tid On low dose metoprolol (only 12.5 mg daily)   PREVIOUS MEDICATIONS:  primidone, up to 250 mg twice per day; carbidopa/levodopa ; entacapone   ALLERGIES:   Allergies  Allergen Reactions   Shellfish Allergy Shortness Of Breath   Duloxetine Itching and Rash   Sulfa Drugs Cross Reactors Anxiety and Rash   Lisinopril     Other reaction(s): cough Other reaction(s): cough Other reaction(s): cough   Lyrica [Pregabalin] Other (See Comments)    "made me drunk, couldn't function"   Other     Other reaction(s): hyper  Sulfa Antibiotics Other (See Comments)    Other reaction(s): hyper, break out in a rash Other reaction(s): hyper, break out in a rash   Codeine Anxiety    Other reaction(s): hyper Other reaction(s): hyper    CURRENT MEDICATIONS:  Current Meds  Medication Sig   albuterol (VENTOLIN HFA) 108 (90 Base) MCG/ACT inhaler Inhale 1-2 puffs into the lungs every 6 (six) hours as needed for wheezing or shortness of breath.   atorvastatin (LIPITOR) 20 MG tablet Take 20 mg by mouth daily.   budesonide-formoterol (SYMBICORT) 160-4.5 MCG/ACT inhaler Inhale 2 puffs into the lungs as needed.   CALCIUM CARBONATE PO Take 1 tablet by mouth 2 (two) times daily.   Cholecalciferol (VITAMIN D) 1000 UNITS capsule Take 1,000 Units by mouth 2 (two) times daily.   cyclobenzaprine (FLEXERIL) 5 MG tablet TAKE 1 TO UP TO 2 TABLETS BY MOUTH ONCE DAILY AT BEDTIME AS NEEDED FOR MUSCLE SPASM   EPINEPHrine 0.3 mg/0.3 mL IJ SOAJ injection Inject 0.3 mg into the muscle as needed.   famotidine (PEPCID) 20 MG tablet  Take 1 tablet (20 mg total) by mouth daily.   fluticasone (FLOVENT HFA) 110 MCG/ACT inhaler 1 puff   ipratropium (ATROVENT) 0.03 % nasal spray USE 2 SPRAY(S) IN EACH NOSTRIL TWICE DAILY   ketotifen (ZADITOR) 0.025 % ophthalmic solution Apply 1 drop to eye daily.   levocetirizine (XYZAL) 5 MG tablet TAKE 1 TABLET BY MOUTH ONCE DAILY IN THE EVENING . APPOINTMENT REQUIRED FOR FUTURE REFILLS   metoprolol succinate (TOPROL-XL) 25 MG 24 hr tablet Take 12.5 mg by mouth daily. Take 1/2 Tablet In The Morning   Na Sulfate-K Sulfate-Mg Sulf 17.5-3.13-1.6 GM/177ML SOLN Use as directed; may use generic; goodrx card if insurance will not cover generic   nystatin ointment (MYCOSTATIN) Apply 1 Application topically 2 (two) times daily.   Polyethyl Glycol-Propyl Glycol (SYSTANE OP) Place 1 drop into both eyes 2 (two) times daily.   primidone (MYSOLINE) 50 MG tablet Take 2 tablets (100 mg total) by mouth 3 (three) times daily.   traMADol (ULTRAM) 50 MG tablet TAKE 1 TABLET BY MOUTH EVERY 12 HOURS AS NEEDED   triamcinolone ointment (KENALOG) 0.1 % Apply 1 Application topically as needed.   venlafaxine (EFFEXOR) 75 MG tablet Take 1 tablet (75 mg total) by mouth daily.     Objective:   PHYSICAL EXAMINATION:    VITALS:   Vitals:   11/17/22 1314  BP: 124/78  Pulse: 71  SpO2: 96%  Weight: 125 lb (56.7 kg)  Height: 5' (1.524 m)    GEN:  The patient appears stated age and is in NAD. HEENT:  Normocephalic, atraumatic.  The mucous membranes are moist. The superficial temporal arteries are without ropiness or tenderness. CV:  RRR Lungs:  CTAB Neck/HEME:  There are no carotid bruits bilaterally.  Neurological examination:  Orientation: The patient is alert and oriented x3. Cranial nerves: There is good facial symmetry without facial hypomimia. The speech is fluent and clear. Soft palate rises symmetrically and there is no tongue deviation. Hearing is intact to conversational tone. Sensation: Sensation is  intact to light touch throughout Motor: Strength is at least antigravity x4.  Movement examination: Tone: There is nl tone in the bilateral upper extremities.  The tone in the lower extremities is nl.  Abnormal movements: there is b/l UE rest tremor (doesn't increase with distraction), R>L.  There is mild postural and at least mod intention tremor.  She has trouble getting the pen to the  paper for archimedes spirals, esp on the right.  Coordination:  There is difficulty only with finger taps and that was not decremation but she had trouble doing it b/c of tremor.  All other RAMs are nl with any form of RAMS, including alternating supination and pronation of the forearm, hand opening and closing, heel taps and toe taps bilaterally  Gait and Station: The patient pushes off to arise.  The patient's stride length is decreased but no shuffling. She is somewhat unstable  I have reviewed and interpreted the following labs independently    Chemistry      Component Value Date/Time   NA 142 07/30/2020 1141   NA 136 01/20/2015 1326   K 4.5 07/30/2020 1141   K 4.1 01/20/2015 1326   CL 100 07/30/2020 1141   CL 102 06/30/2012 1231   CO2 25 07/30/2020 1141   CO2 27 01/20/2015 1326   BUN 11 07/30/2020 1141   BUN 13.9 01/20/2015 1326   CREATININE 0.65 07/30/2020 1141   CREATININE 0.9 01/20/2015 1326      Component Value Date/Time   CALCIUM 9.1 07/30/2020 1141   CALCIUM 8.8 01/20/2015 1326   ALKPHOS 86 01/20/2015 1326   AST 17 01/20/2015 1326   ALT 13 01/20/2015 1326   BILITOT <0.30 01/20/2015 1326       Lab Results  Component Value Date   WBC 5.6 09/13/2022   HGB 12.9 09/13/2022   HCT 39.7 09/13/2022   MCV 93.0 09/13/2022   PLT 316.0 09/13/2022    No results found for: "TSH"   Total time spent on today's visit was 33 minutes, including both face-to-face time and nonface-to-face time.  Time included that spent on review of records (prior notes available to me/labs/imaging if  pertinent), discussing treatment and goals, answering patient's questions and coordinating care.  Cc:  Lorenda Ishihara, MD

## 2022-11-17 ENCOUNTER — Encounter: Payer: Self-pay | Admitting: Neurology

## 2022-11-17 ENCOUNTER — Ambulatory Visit (INDEPENDENT_AMBULATORY_CARE_PROVIDER_SITE_OTHER): Payer: Medicare Other | Admitting: Neurology

## 2022-11-17 VITALS — BP 124/78 | HR 71 | Ht 60.0 in | Wt 125.0 lb

## 2022-11-17 DIAGNOSIS — G250 Essential tremor: Secondary | ICD-10-CM | POA: Diagnosis not present

## 2022-11-17 MED ORDER — PRIMIDONE 50 MG PO TABS: ORAL_TABLET | ORAL | 1 refills | Status: DC

## 2022-11-17 NOTE — Patient Instructions (Addendum)
Increase primidone to 50 mg, 3 in the AM, 2 at noon, and 2 at night for a week and then take primidone 50 mg, 3 in the AM, 3 at noon, 2 at night  The physicians and staff at [facility] are committed to providing excellent care. You may receive a survey requesting feedback about your experience at our office. We strive to receive "very good" responses to the survey questions. If you feel that your experience would prevent you from giving the office a "very good " response, please contact our office to try to remedy the situation. We may be reached at [phone]. Thank you for taking the time out of your busy day to complete the survey.

## 2022-11-18 ENCOUNTER — Ambulatory Visit: Payer: Medicare Other | Admitting: Neurology

## 2022-12-03 ENCOUNTER — Encounter: Payer: Self-pay | Admitting: Internal Medicine

## 2022-12-03 ENCOUNTER — Ambulatory Visit (INDEPENDENT_AMBULATORY_CARE_PROVIDER_SITE_OTHER): Payer: Medicare Other | Admitting: Internal Medicine

## 2022-12-03 VITALS — BP 122/88 | HR 83 | Ht 60.0 in | Wt 124.0 lb

## 2022-12-03 DIAGNOSIS — K625 Hemorrhage of anus and rectum: Secondary | ICD-10-CM | POA: Diagnosis not present

## 2022-12-03 DIAGNOSIS — K626 Ulcer of anus and rectum: Secondary | ICD-10-CM | POA: Diagnosis not present

## 2022-12-03 DIAGNOSIS — Z860100 Personal history of colon polyps, unspecified: Secondary | ICD-10-CM

## 2022-12-03 DIAGNOSIS — K649 Unspecified hemorrhoids: Secondary | ICD-10-CM | POA: Diagnosis not present

## 2022-12-03 NOTE — Progress Notes (Signed)
Chief Complaint: Rectal bleeding  HPI : 82 year old female with history of asthma, breast cancer in remission, GERD, Parkinson disease presents for follow up of rectal bleeding  Interval History: She is having rectal bleeding on occasion but it is improved from prior. Denies rectal pain. She is having one BM per day. Denies constipation.   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 2011- no further tx. now- Dr. Donnie Coffin , Dr. Dayton Scrape   Cyst of finger 11/2011   annular cyst right long finger   Dental crowns present    Dermatitis    Frequency of urination    GERD (gastroesophageal reflux disease)    Hemorrhoid    History of colon polyps 2009   Also noted 2012 and 2017 by colonoscopy.   History of shingles 1971   Had right ischial recurrence in the March 2019   Hyperlipidemia    On atorvastatin   Hypertension    under control, has been on med. x 2 yrs.   Liposarcoma of left shoulder (HCC) 1969   Parkinson's disease (HCC)    With mild tremor (neurologist Dr. Marjory Lies), neurosurgeon Dr. Lovell Sheehan   PONV (postoperative nausea and vomiting)    Seasonal allergies    Shingles of eyelid 1971   Right eyelid; recurrent -> last episode 05/11/2017   Tremors of nervous system    hands-essential tremor; associated with Parkinson's.   Trigger finger of right hand 11/2011   long finger     Past Surgical History:  Procedure Laterality Date   14-day Zio Patch Monitor  08/2020   (report to be scanned): Predominant SR w/ HR 61 to 142 bpm and average 86 bpm.  Total of 59 episodes of PAT/PSVT  4 to 15 beats (not noted on diary).  Fastest was 5 beats at a rate of 193 bpm, longest was 15 beats at  a rate of 106 bpm.  Rare isolated PACs (as well as couplets and triplets) with rare isolated PVCs.  No sustained arrhythmias or bradycardia to explain syncope.   ANTERIOR CERVICAL DECOMPRESSION/DISCECTOMY FUSION 4 LEVELS N/A 11/14/2013   Procedure: ANTERIOR CERVICAL DECOMPRESSION/DISCECTOMY FUSION 4 LEVELS;  Surgeon: Tressie Stalker, MD;  Location: [facility];  Service: Neurosurgery;  Laterality: N/A;  C34 C45 C56 C67 anterior cervical fusion with interbody prosthesis plating and  bonegraft   APPENDECTOMY  age 33   BREAST LUMPECTOMY  06/16/2009   left; SLN bx.   BREAST LUMPECTOMY  07/01/2009   re-excision   BREAST SURGERY  02/22/1997   reduction   CATARACT EXTRACTION, BILATERAL     COLONOSCOPY WITH PROPOFOL N/A 12/03/2014   Procedure: COLONOSCOPY WITH PROPOFOL;  Surgeon: Charolett Bumpers, MD;  Location: [facility];  Service: Endoscopy;  Laterality: N/A;   FOOT SURGERY  06/22/2011   left   KNEE ARTHROSCOPY  03/12/2005   right   KNEE ARTHROSCOPY     left   Lower Extremity Venous Dopplers  12/12/2020   No DVT bilaterally in the deep veins or superficial veins.  No deep or superficial venous reflux noted bilaterally with exception of Right SSV at the knee.   NASAL SINUS  SURGERY     x 2   NEPHRECTOMY LIVING DONOR Left 04/22/2008   donated to spouse 2010([facility])   TRANSTHORACIC ECHOCARDIOGRAM  12/10/2020   EF 60 to 65%.  Normal LV size and function.  No heart WMA.  GR 1 DD.  Normal RV, RVP and RAP.[name]  Normal valves.   TRIGGER FINGER RELEASE  12/16/2011   Procedure: RELEASE TRIGGER FINGER/A-1 PULLEY;  Surgeon: Tami Ribas, MD;  Location: [facility];  Service: Orthopedics;  Laterality: Right;  RIGHT LONG FINGER TRIGGER RELEASE & ANNULAR CYST EXCISION   TUMOR EXCISION  age 67   right arm   Family History  Problem Relation Age of Onset   Kidney disease Mother    Stroke Father    Stroke Sister        08/2015   Diabetes Brother    Heart disease Brother    Ovarian  cancer Sister 45   Heart disease Paternal Grandmother    Heart disease Paternal Grandfather    Leukemia Other 15       brother's grandson   Bladder Cancer Brother 70   Colon cancer Niece        dx in late 53s   Social History   Tobacco Use   Smoking status: Never   Smokeless tobacco: Never  Vaping Use   Vaping status: Never Used  Substance Use Topics   Alcohol use: No    Alcohol/week: 0.0 standard drinks of alcohol   Drug use: No   Current Outpatient Medications  Medication Sig Dispense Refill   albuterol (VENTOLIN HFA) 108 (90 Base) MCG/ACT inhaler Inhale 1-2 puffs into the lungs every 6 (six) hours as needed for wheezing or shortness of breath. 18 g 1   amLODipine (NORVASC) 2.5 MG tablet Take one tablet each day. 90 tablet 3   atorvastatin (LIPITOR) 20 MG tablet Take 20 mg by mouth daily.     budesonide-formoterol (SYMBICORT) 160-4.5 MCG/ACT inhaler Inhale 2 puffs into the lungs as needed. 1 each 5   CALCIUM CARBONATE PO Take 1 tablet by mouth 2 (two) times daily.     Cholecalciferol (VITAMIN D) 1000 UNITS capsule Take 1,000 Units by mouth 2 (two) times daily.     cyclobenzaprine (FLEXERIL) 5 MG tablet TAKE 1 TO UP TO 2 TABLETS BY MOUTH ONCE DAILY AT BEDTIME AS NEEDED FOR MUSCLE SPASM 50 tablet 11   EPINEPHrine 0.3 mg/0.3 mL IJ SOAJ injection Inject 0.3 mg into the muscle as needed. 2 each 1   famotidine (PEPCID) 20 MG tablet Take 1 tablet (20 mg total) by mouth daily. 30 tablet 5   fluticasone (FLOVENT HFA) 110 MCG/ACT inhaler 1 puff     ipratropium (ATROVENT) 0.03 % nasal spray USE 2 SPRAY(S) IN EACH NOSTRIL TWICE DAILY 30 mL 5   ketotifen (ZADITOR) 0.025 % ophthalmic solution Apply 1 drop to eye daily.     levocetirizine (XYZAL) 5 MG tablet TAKE 1 TABLET BY MOUTH ONCE DAILY IN THE EVENING . APPOINTMENT REQUIRED FOR FUTURE REFILLS 30 tablet 0   metoprolol succinate (TOPROL-XL) 25 MG 24 hr tablet Take 12.5 mg by mouth daily. Take 1/2 Tablet In The Morning     Na Sulfate-K  Sulfate-Mg Sulf 17.5-3.13-1.6 GM/177ML SOLN Use as directed; may use generic; goodrx card if insurance will not cover generic 9912 each 0   nystatin ointment (MYCOSTATIN) Apply 1 Application topically 2 (two) times daily.     Polyethyl Glycol-Propyl Glycol (SYSTANE OP) Place 1 drop into both  eyes 2 (two) times daily.     primidone (MYSOLINE) 50 MG tablet 3 in the AM, 3 at noon, 2 at night 720 tablet 1   traMADol (ULTRAM) 50 MG tablet TAKE 1 TABLET BY MOUTH EVERY 12 HOURS AS NEEDED 20 tablet 0   triamcinolone ointment (KENALOG) 0.1 % Apply 1 Application topically as needed.     venlafaxine (EFFEXOR) 75 MG tablet Take 1 tablet (75 mg total) by mouth daily. 90 tablet 1   No current facility-administered medications for this visit.   Allergies  Allergen Reactions   Shellfish Allergy Shortness Of Breath   Duloxetine Itching and Rash   Sulfa Drugs Cross Reactors Anxiety and Rash   Lisinopril     Other reaction(s): cough Other reaction(s): cough Other reaction(s): cough   Lyrica [Pregabalin] Other (See Comments)    "made me drunk, couldn't function"   Other     Other reaction(s): hyper   Sulfa Antibiotics Other (See Comments)    Other reaction(s): hyper, break out in a rash Other reaction(s): hyper, break out in a rash   Codeine Anxiety    Other reaction(s): hyper Other reaction(s): hyper     Physical Exam: BP 122/88   Pulse 83   Ht 5' (1.524 m)   Wt 124 lb (56.2 kg)   BMI 24.22 kg/m  Constitutional: Pleasant,well-developed, female in no acute distress. HEENT: Normocephalic and atraumatic. Conjunctivae are normal. No scleral icterus. Cardiovascular: Normal rate Pulmonary/chest: Effort normal  Abdominal: Soft Extremities: No edema Neurological: Alert and oriented to person place and time. Skin: Skin is warm and dry. No rashes noted. Psychiatric: Normal mood and affect. Behavior is normal.  Labs 07/30/20: BMP nml.   Labs 08/2022: CBC nml. Ferritin/IBC with mildly low iron sat  of 19.2%  MR Pelvis w/contrast 10/06/20: IMPRESSION: Multiple small uterine fibroids, largest measuring 2.2 cm. No evidence of pedunculated or intracavitary fibroids. Nonvisualization of ovaries, however no adnexal mass identified. Right-sided sacral insufficiency fracture  05/11/2007 colonoscopy performed with removal of a 4 mm cecal adenomatous polyp.   10/27/2010 colonoscopy performed with removal of a 4 mm ascending colon tubular adenomatous polyp.   Colonoscopy 12/03/14: Assessment: Normal surveillance colonoscopy   Colonoscopy 10/18/22: - The examined portion of the ileum was normal.  - Two 3 to 6 mm polyps in the transverse colon and in the cecum, removed with a cold snare. Resected and retrieved.  - A single ( solitary) ulcer in the rectum. Biopsied. - Internal hemorrhoids Path: 1. Surgical [P], colon, transverse polyp x 1 and cecum polyp x 1, polyp (2) - SESSILE SERRATED POLYP, WITH REACTIVE/REPARATIVE CHANGE AND A SMALL LYMPHOID AGGREGATE, NEGATIVE FOR DYSPLASIA. 2. Surgical [P], colon, rectal ulcer bx - COLONIC MUCOSA WITH ULCERATION AND GRANULATION TISSUE, NONSPECIFIC - IMMUNOHISTOCHEMICAL STAINS FOR CMV AND EBV ARE NEGATIVE.  ASSESSMENT AND PLAN: Rectal bleeding Hemorrhoids History of colon polyps Patient recently had a colonoscopy done in 09/2022 with two colon polyps (one SSP, one lymphoid aggregate) that was resected and an ulcer in the rectum that was biopsied. I suspect that since her first hemorrhoidal banding was performed in 08/2022, that her rectal ulcer was due to her prior hemorrhoidal banding. Patient has had some improvement in rectal bleeding after her banding session. Thus will plan to proceed with her second banding session today. I also asked the patient to start a daily stool softener and daily fiber supplement in order to help with prevention of rectal bleeding due to hemorrhoids. - Start daily fiber supplement  -  Start daily stool softener - Plan for  hemorrhoidal banding today - RTC in 1 month for 3rd hemorrhoidal banding  Eulah Pont, MD  PROCEDURE NOTE: The patient presents with symptomatic grade 2 hemorrhoids, requesting rubber band ligation of his/her hemorrhoidal disease.  All risks, benefits and alternative forms of therapy were described and informed consent was obtained.  In the Left Lateral Decubitus position the anorectum was pre-medicated with nitroglycerin and Recticare The decision was made to band the right anterior internal hemorrhoid, and the [facility] O'Regan System was used to perform band ligation without complication.  Digital anorectal examination was then performed to assure proper positioning of the band, and to adjust the banded tissue as required.  The patient was discharged home without pain or other issues.  Dietary and behavioral recommendations were given and along with follow-up instructions.     The following adjunctive treatments were recommended: Maintain regular bowel movements  No complications were encountered and the patient tolerated the procedure well.  I spent 33 minutes of time, including in depth chart review, independent review of results as outlined above, communicating results with the patient directly, face-to-face time with the patient, coordinating care, ordering studies and medications as appropriate, and documentation.

## 2022-12-03 NOTE — Patient Instructions (Addendum)
Start daily fiber supplement with Benefiber  Start taking stool softener daily  You are scheduled for a follow up visit on 01/03/23 at 3:40 pm  HEMORRHOID BANDING PROCEDURE    FOLLOW-UP CARE   The procedure you have had should have been relatively painless since the banding of the area involved does not have nerve endings and there is no pain sensation.  The rubber band cuts off the blood supply to the hemorrhoid and the band may fall off as soon as 48 hours after the banding (the band may occasionally be seen in the toilet bowl following a bowel movement). You may notice a temporary feeling of fullness in the rectum which should respond adequately to plain Tylenol or Motrin.  Following the banding, avoid strenuous exercise that evening and resume full activity the next day.  A sitz bath (soaking in a warm tub) or bidet is soothing, and can be useful for cleansing the area after bowel movements.     To avoid constipation, take two tablespoons of natural wheat bran, natural oat bran, flax, Benefiber or any over the counter fiber supplement and increase your water intake to 7-8 glasses daily.    Unless you have been prescribed anorectal medication, do not put anything inside your rectum for two weeks: No suppositories, enemas, fingers, etc.  Occasionally, you may have more bleeding than usual after the banding procedure.  This is often from the untreated hemorrhoids rather than the treated one.  Don't be concerned if there is a tablespoon or so of blood.  If there is more blood than this, lie flat with your bottom higher than your head and apply an ice pack to the area. If the bleeding does not stop within a half an hour or if you feel faint, call our office at [phone] or go to the emergency room.  Problems are not common; however, if there is a substantial amount of bleeding, severe pain, chills, fever or difficulty passing urine (very rare) or other problems, you should call us at  [phone] or report to the nearest emergency room.  Do not stay seated continuously for more than 2-3 hours for a day or two after the procedure.  Tighten your buttock muscles 10-15 times every two hours and take 10-15 deep breaths every 1-2 hours.  Do not spend more than a few minutes on the toilet if you cannot empty your bowel; instead re-visit the toilet at a later time.    Thank you for entrusting me with your care and for choosing [facility], Dr. Eulah Pont

## 2022-12-06 ENCOUNTER — Telehealth: Payer: Self-pay | Admitting: Physical Medicine and Rehabilitation

## 2022-12-06 DIAGNOSIS — G8929 Other chronic pain: Secondary | ICD-10-CM

## 2022-12-06 NOTE — Telephone Encounter (Signed)
Patient is calling to report the knee injections are not helping. Please advise next steps.

## 2022-12-13 DIAGNOSIS — E785 Hyperlipidemia, unspecified: Secondary | ICD-10-CM | POA: Diagnosis not present

## 2022-12-13 DIAGNOSIS — Z23 Encounter for immunization: Secondary | ICD-10-CM | POA: Diagnosis not present

## 2022-12-13 DIAGNOSIS — I10 Essential (primary) hypertension: Secondary | ICD-10-CM | POA: Diagnosis not present

## 2022-12-13 DIAGNOSIS — F3341 Major depressive disorder, recurrent, in partial remission: Secondary | ICD-10-CM | POA: Diagnosis not present

## 2022-12-13 DIAGNOSIS — M25569 Pain in unspecified knee: Secondary | ICD-10-CM | POA: Diagnosis not present

## 2022-12-13 DIAGNOSIS — C50512 Malignant neoplasm of lower-outer quadrant of left female breast: Secondary | ICD-10-CM | POA: Diagnosis not present

## 2022-12-15 ENCOUNTER — Encounter: Payer: Self-pay | Admitting: Neurology

## 2022-12-17 ENCOUNTER — Encounter: Payer: Medicare Other | Admitting: Physical Medicine and Rehabilitation

## 2022-12-23 ENCOUNTER — Other Ambulatory Visit (INDEPENDENT_AMBULATORY_CARE_PROVIDER_SITE_OTHER): Payer: Medicare Other

## 2022-12-23 ENCOUNTER — Encounter: Payer: Self-pay | Admitting: Sports Medicine

## 2022-12-23 ENCOUNTER — Ambulatory Visit: Payer: Medicare Other | Admitting: Surgical

## 2022-12-23 ENCOUNTER — Ambulatory Visit: Payer: Medicare Other | Admitting: Sports Medicine

## 2022-12-23 DIAGNOSIS — G8929 Other chronic pain: Secondary | ICD-10-CM

## 2022-12-23 DIAGNOSIS — M25562 Pain in left knee: Secondary | ICD-10-CM

## 2022-12-23 DIAGNOSIS — M25561 Pain in right knee: Secondary | ICD-10-CM

## 2022-12-23 DIAGNOSIS — G250 Essential tremor: Secondary | ICD-10-CM | POA: Diagnosis not present

## 2022-12-23 DIAGNOSIS — Z905 Acquired absence of kidney: Secondary | ICD-10-CM

## 2022-12-23 DIAGNOSIS — M170 Bilateral primary osteoarthritis of knee: Secondary | ICD-10-CM | POA: Diagnosis not present

## 2022-12-23 NOTE — Progress Notes (Signed)
Patient says that she has been in pain management for 2-3 years and has been getting injections in the knees every three months. She says that the last couple of injections where not giving her any relief. Patient walks with a cane but says that this is for balance, not due to the knee pain. She says that the pain is primarily in the medial aspect of her knees. Patient says that she will take extra strength Tylenol for pain which will help, but would like to avoid prescription medication. She also says that she would like to avoid surgery if there are other options for pain relief.

## 2022-12-24 ENCOUNTER — Telehealth: Payer: Self-pay

## 2022-12-24 NOTE — Telephone Encounter (Signed)
-----   Message from [place] W sent at 12/23/2022  2:29 PM EDT ----- Regarding: Gel Injections - Bilateral Knees April [place],  Could we get prior authorization for this patient for gel injections for both knees?   Thank you!

## 2022-12-24 NOTE — Telephone Encounter (Signed)
VOB submitted for Monovisc, bilateral knee  

## 2023-01-03 ENCOUNTER — Ambulatory Visit (INDEPENDENT_AMBULATORY_CARE_PROVIDER_SITE_OTHER): Payer: Medicare Other | Admitting: Internal Medicine

## 2023-01-03 ENCOUNTER — Encounter: Payer: Self-pay | Admitting: Internal Medicine

## 2023-01-03 VITALS — BP 136/82 | HR 90 | Ht 61.0 in | Wt 124.2 lb

## 2023-01-03 DIAGNOSIS — K649 Unspecified hemorrhoids: Secondary | ICD-10-CM | POA: Diagnosis not present

## 2023-01-03 MED ORDER — HYDROCORTISONE (PERIANAL) 2.5 % EX CREA: 1.0000 | TOPICAL_CREAM | Freq: Two times a day (BID) | CUTANEOUS | 1 refills | Status: DC

## 2023-01-03 NOTE — Patient Instructions (Addendum)
We have sent the following medications to your pharmacy for you to pick up at your convenience: Anusol  _______________________________________________________  If your blood pressure at your visit was 140/90 or greater, please contact your primary care physician to follow up on this.  _______________________________________________________  If you are age 83 or older, your body mass index should be between 23-30. Your Body mass index is 23.48 kg/m. If this is out of the aforementioned range listed, please consider follow up with your Primary Care Provider.  If you are age 62 or younger, your body mass index should be between 19-25. Your Body mass index is 23.48 kg/m. If this is out of the aformentioned range listed, please consider follow up with your Primary Care Provider.   ________________________________________________________  The [facility] providers would like to encourage you to use [facility] to communicate with providers for non-urgent requests or questions.  Due to long hold times on the telephone, sending your provider a message by [facility] may be a faster and more efficient way to get a response.  Please allow 48 business hours for a response.  Please remember that this is for non-urgent requests.  _______________________________________________________   Thank you for entrusting me with your care and for choosing [facility], Dr. Eulah Pont

## 2023-01-03 NOTE — Progress Notes (Signed)
Chief Complaint: Rectal bleeding  HPI : 82 year old female with history of asthma, breast cancer in remission, GERD, Parkinson disease presents for follow up of rectal bleeding  Interval History: She is not having any more rectal bleeding. Denies rectal pain. Still having some rectal itching.  Anusol HC cream seems to be helping. She is still taking a stool softener, and her stools are soft as a result. She is having one BM per day on average. She does still feel some tissue in the perianal area that is soft. Denies ab pain. She is eating and drinking well.   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 2011- no further tx. now- Dr. Donnie Coffin , Dr. Dayton Scrape   Cyst of finger 11/2011   annular cyst right long finger   Dental crowns present    Dermatitis    Frequency of urination    GERD (gastroesophageal reflux disease)    Hemorrhoid    History of colon polyps 2009   Also noted 2012 and 2017 by colonoscopy.   History of shingles 1971   Had right ischial recurrence in the March 2019   Hyperlipidemia    On atorvastatin   Hypertension    under control, has been on med. x 2 yrs.   Liposarcoma of left shoulder (HCC) 1969   Parkinson's disease (HCC)    With mild tremor (neurologist Dr. Marjory Lies), neurosurgeon Dr. Lovell Sheehan   PONV (postoperative nausea and vomiting)    Seasonal allergies    Shingles of eyelid 1971   Right eyelid; recurrent -> last episode 05/11/2017   Tremors of nervous system    hands-essential tremor; associated with Parkinson's.   Trigger finger of right hand 11/2011   long finger     Past Surgical History:  Procedure Laterality Date   14-day Zio Patch Monitor   08/2020   (report to be scanned): Predominant SR w/ HR 61 to 142 bpm and average 86 bpm.  Total of 59 episodes of PAT/PSVT  4 to 15 beats (not noted on diary).  Fastest was 5 beats at a rate of 193 bpm, longest was 15 beats at a rate of 106 bpm.  Rare isolated PACs (as well as couplets and triplets) with rare isolated PVCs.  No sustained arrhythmias or bradycardia to explain syncope.   ANTERIOR CERVICAL DECOMPRESSION/DISCECTOMY FUSION 4 LEVELS N/A 11/14/2013   Procedure: ANTERIOR CERVICAL DECOMPRESSION/DISCECTOMY FUSION 4 LEVELS;  Surgeon: Tressie Stalker, MD;  Location: [facility];  Service: Neurosurgery;  Laterality: N/A;  C34 C45 C56 C67 anterior cervical fusion with interbody prosthesis plating and  bonegraft   APPENDECTOMY  age 89   BREAST LUMPECTOMY  06/16/2009   left; SLN bx.   BREAST LUMPECTOMY  07/01/2009   re-excision   BREAST SURGERY  02/22/1997   reduction   CATARACT EXTRACTION, BILATERAL     COLONOSCOPY WITH PROPOFOL N/A 12/03/2014   Procedure: COLONOSCOPY WITH PROPOFOL;  Surgeon: Charolett Bumpers, MD;  Location: [facility];  Service: Endoscopy;  Laterality: N/A;   FOOT SURGERY  06/22/2011   left   KNEE ARTHROSCOPY  03/12/2005   right   KNEE  ARTHROSCOPY     left   Lower Extremity Venous Dopplers  12/12/2020   No DVT bilaterally in the deep veins or superficial veins.  No deep or superficial venous reflux noted bilaterally with exception of Right SSV at the knee.   NASAL SINUS SURGERY     x 2   NEPHRECTOMY LIVING DONOR Left 04/22/2008   donated to spouse 2010([facility])   TRANSTHORACIC ECHOCARDIOGRAM  12/10/2020   EF 60 to 65%.  Normal LV size and function.  No heart WMA.  GR 1 DD.  Normal RV, RVP and RAP.[name]  Normal valves.   TRIGGER FINGER RELEASE  12/16/2011   Procedure: RELEASE TRIGGER FINGER/A-1 PULLEY;  Surgeon: Tami Ribas, MD;  Location: [facility];  Service: Orthopedics;  Laterality: Right;  RIGHT LONG FINGER TRIGGER RELEASE & ANNULAR CYST EXCISION    TUMOR EXCISION  age 42   right arm   Family History  Problem Relation Age of Onset   Kidney disease Mother    Stroke Father    Stroke Sister        08/2015   Diabetes Brother    Heart disease Brother    Ovarian cancer Sister 45   Heart disease Paternal Grandmother    Heart disease Paternal Grandfather    Leukemia Other 15       brother's grandson   Bladder Cancer Brother 58   Colon cancer Niece        dx in late 83s   Social History   Tobacco Use   Smoking status: Never   Smokeless tobacco: Never  Vaping Use   Vaping status: Never Used  Substance Use Topics   Alcohol use: No    Alcohol/week: 0.0 standard drinks of alcohol   Drug use: No   Current Outpatient Medications  Medication Sig Dispense Refill   albuterol (VENTOLIN HFA) 108 (90 Base) MCG/ACT inhaler Inhale 1-2 puffs into the lungs every 6 (six) hours as needed for wheezing or shortness of breath. 18 g 1   amLODipine (NORVASC) 2.5 MG tablet Take one tablet each day. 90 tablet 3   atorvastatin (LIPITOR) 20 MG tablet Take 20 mg by mouth daily.     budesonide-formoterol (SYMBICORT) 160-4.5 MCG/ACT inhaler Inhale 2 puffs into the lungs as needed. 1 each 5   CALCIUM CARBONATE PO Take 1 tablet by mouth 2 (two) times daily.     Cholecalciferol (VITAMIN D) 1000 UNITS capsule Take 1,000 Units by mouth 2 (two) times daily.     cyclobenzaprine (FLEXERIL) 5 MG tablet TAKE 1 TO UP TO 2 TABLETS BY MOUTH ONCE DAILY AT BEDTIME AS NEEDED FOR MUSCLE SPASM 50 tablet 11   EPINEPHrine 0.3 mg/0.3 mL IJ SOAJ injection Inject 0.3 mg into the muscle as needed. 2 each 1   famotidine (PEPCID) 20 MG tablet Take 1 tablet (20 mg total) by mouth daily. 30 tablet 5   fluticasone (FLOVENT HFA) 110 MCG/ACT inhaler 1 puff     ipratropium (ATROVENT) 0.03 % nasal spray USE 2 SPRAY(S) IN EACH NOSTRIL TWICE DAILY 30 mL 5   ketotifen (ZADITOR) 0.025 % ophthalmic solution Apply 1 drop to eye daily.     levocetirizine (XYZAL) 5 MG tablet TAKE 1 TABLET BY  MOUTH ONCE DAILY IN THE EVENING . APPOINTMENT REQUIRED FOR FUTURE REFILLS 30 tablet 0   metoprolol succinate (TOPROL-XL) 25 MG 24 hr tablet Take 12.5 mg by mouth daily. Take 1/2 Tablet In The Morning     Na Sulfate-K Sulfate-Mg Sulf  17.5-3.13-1.6 GM/177ML SOLN Use as directed; may use generic; goodrx card if insurance will not cover generic 9912 each 0   nystatin ointment (MYCOSTATIN) Apply 1 Application topically 2 (two) times daily.     Polyethyl Glycol-Propyl Glycol (SYSTANE OP) Place 1 drop into both eyes 2 (two) times daily.     primidone (MYSOLINE) 50 MG tablet 3 in the AM, 3 at noon, 2 at night 720 tablet 1   traMADol (ULTRAM) 50 MG tablet TAKE 1 TABLET BY MOUTH EVERY 12 HOURS AS NEEDED 20 tablet 0   triamcinolone ointment (KENALOG) 0.1 % Apply 1 Application topically as needed.     venlafaxine (EFFEXOR) 75 MG tablet Take 1 tablet (75 mg total) by mouth daily. 90 tablet 1   No current facility-administered medications for this visit.   Allergies  Allergen Reactions   Shellfish Allergy Shortness Of Breath   Duloxetine Itching and Rash   Sulfa Drugs Cross Reactors Anxiety and Rash   Lisinopril     Other reaction(s): cough Other reaction(s): cough Other reaction(s): cough   Lyrica [Pregabalin] Other (See Comments)    "made me drunk, couldn't function"   Other     Other reaction(s): hyper   Sulfa Antibiotics Other (See Comments)    Other reaction(s): hyper, break out in a rash Other reaction(s): hyper, break out in a rash   Codeine Anxiety    Other reaction(s): hyper Other reaction(s): hyper     Physical Exam: BP 136/82 (BP Location: Left Arm, Patient Position: Sitting, Cuff Size: Normal)   Pulse 90   Ht 5\' 1"  (1.549 m)   Wt 124 lb 4 oz (56.4 kg)   SpO2 90%   BMI 23.48 kg/m  Constitutional: Pleasant,well-developed, female in no acute distress. HEENT: Normocephalic and atraumatic. Conjunctivae are normal. No scleral icterus. Cardiovascular: Normal rate Pulmonary/chest:  Effort normal  Abdominal: Soft Extremities: No edema Neurological: Alert and oriented to person place and time. Skin: Skin is warm and dry. No rashes noted. Psychiatric: Normal mood and affect. Behavior is normal.  Labs 07/30/20: BMP nml.   Labs 08/2022: CBC nml. Ferritin/IBC with mildly low iron sat of 19.2%  MR Pelvis w/contrast 10/06/20: IMPRESSION: Multiple small uterine fibroids, largest measuring 2.2 cm. No evidence of pedunculated or intracavitary fibroids. Nonvisualization of ovaries, however no adnexal mass identified. Right-sided sacral insufficiency fracture  05/11/2007 colonoscopy performed with removal of a 4 mm cecal adenomatous polyp.   10/27/2010 colonoscopy performed with removal of a 4 mm ascending colon tubular adenomatous polyp.   Colonoscopy 12/03/14: Assessment: Normal surveillance colonoscopy   Colonoscopy 10/18/22: - The examined portion of the ileum was normal.  - Two 3 to 6 mm polyps in the transverse colon and in the cecum, removed with a cold snare. Resected and retrieved.  - A single ( solitary) ulcer in the rectum. Biopsied. - Internal hemorrhoids Path: 1. Surgical [P], colon, transverse polyp x 1 and cecum polyp x 1, polyp (2) - SESSILE SERRATED POLYP, WITH REACTIVE/REPARATIVE CHANGE AND A SMALL LYMPHOID AGGREGATE, NEGATIVE FOR DYSPLASIA. 2. Surgical [P], colon, rectal ulcer bx - COLONIC MUCOSA WITH ULCERATION AND GRANULATION TISSUE, NONSPECIFIC - IMMUNOHISTOCHEMICAL STAINS FOR CMV AND EBV ARE NEGATIVE.  ASSESSMENT AND PLAN: History of rectal bleeding Rectal itching Hemorrhoids History of colon polyps Patient overall seems to be doing well at this time.  Her rectal bleeding has now resolved after 2 hemorrhoidal banding sessions.  The only remaining symptom that the patient has is some rectal itching.  Patient has run out  of her prior topical steroid cream so we will give her a refill today to see if this is able to help with the itching.  Her stools  are soft and she is having 1/day on average.  Patient states that she does feel some tissue next to her perianal area, but I suspect that this is likely residual skin tag. - cont daily fiber supplement  - Cont daily stool softener - Anusol HC cream BID for 7 days - RTC 6 months  Eulah Pont, MD  I spent 35 minutes of time, including in depth chart review, independent review of results as outlined above, communicating results with the patient directly, face-to-face time with the patient, coordinating care, and ordering studies and medications as appropriate, and documentation.

## 2023-01-12 ENCOUNTER — Encounter: Payer: Self-pay | Admitting: Sports Medicine

## 2023-01-12 ENCOUNTER — Ambulatory Visit (INDEPENDENT_AMBULATORY_CARE_PROVIDER_SITE_OTHER): Payer: Medicare Other | Admitting: Sports Medicine

## 2023-01-12 DIAGNOSIS — M170 Bilateral primary osteoarthritis of knee: Secondary | ICD-10-CM

## 2023-01-12 DIAGNOSIS — G8929 Other chronic pain: Secondary | ICD-10-CM

## 2023-01-12 DIAGNOSIS — M25562 Pain in left knee: Secondary | ICD-10-CM

## 2023-01-12 DIAGNOSIS — M25561 Pain in right knee: Secondary | ICD-10-CM

## 2023-01-12 MED ORDER — LIDOCAINE HCL 1 % IJ SOLN
1.0000 mL | INTRAMUSCULAR | Status: AC | PRN
  Administered 2023-01-12: 1 mL

## 2023-01-12 MED ORDER — HYALURONAN 88 MG/4ML IX SOSY
88.0000 mg | PREFILLED_SYRINGE | INTRA_ARTICULAR | Status: AC | PRN
  Administered 2023-01-12: 88 mg via INTRA_ARTICULAR

## 2023-01-12 MED ORDER — METHYLPREDNISOLONE ACETATE 40 MG/ML IJ SUSP
40.0000 mg | INTRAMUSCULAR | Status: AC | PRN
  Administered 2023-01-12: 40 mg via INTRA_ARTICULAR

## 2023-01-12 MED ORDER — BUPIVACAINE HCL 0.25 % IJ SOLN
1.0000 mL | INTRAMUSCULAR | Status: AC | PRN
  Administered 2023-01-12: 1 mL via INTRA_ARTICULAR

## 2023-01-12 NOTE — Progress Notes (Signed)
   Procedure Note  Patient: April Church             Date of Birth: [birth year]           MRN: [id number]             Visit Date: 01/12/2023  Procedures: Visit Diagnoses:  1. Bilateral primary osteoarthritis of knee   2. Chronic pain of left knee   3. Chronic pain of right knee     Large Joint Inj: L knee on 01/12/2023 8:54 AM Indications: pain Details: 22 G 1.5 in needle, anterolateral approach Medications: 1 mL lidocaine 1 %; 1 mL bupivacaine 0.25 %; 40 mg methylPREDNISolone acetate 40 MG/ML; 88 mg Hyaluronan 88 MG/4ML Outcome: tolerated well, no immediate complications Procedure, treatment alternatives, risks and benefits explained, specific risks discussed. Consent was given by the patient. Immediately prior to procedure a time out was called to verify the correct patient, procedure, equipment, support staff and site/side marked as required. Patient was prepped and draped in the usual sterile fashion.    Large Joint Inj: R knee on 01/12/2023 8:54 AM Indications: pain Details: 22 G 1.5 in needle, anteromedial approach Medications: 1 mL lidocaine 1 %; 1 mL bupivacaine 0.25 %; 40 mg methylPREDNISolone acetate 40 MG/ML; 88 mg Hyaluronan 88 MG/4ML Outcome: tolerated well, no immediate complications Procedure, treatment alternatives, risks and benefits explained, specific risks discussed. Consent was given by the patient. Immediately prior to procedure a time out was called to verify the correct patient, procedure, equipment, support staff and site/side marked as required. Patient was prepped and draped in the usual sterile fashion.     -Discussed postinjection protocol -> May use ice/heat or Tylenol for postinjection pain -We will follow-up in about 6 weeks at the start of 2025 to reevaluate patient to see what sort of relief she is getting  Madelyn Brunner, DO Primary Care Sports Medicine Physician  [facility]  This note was dictated using Dragon  naturally speaking software and may contain errors in syntax, spelling, or content which have not been identified prior to signing this note.

## 2023-01-12 NOTE — Progress Notes (Signed)
Patient says that her knees are hurting the same as last appointment. She says that the right knee is worse than the left, and maybe feels worse than it did last visit.

## 2023-01-13 DIAGNOSIS — L814 Other melanin hyperpigmentation: Secondary | ICD-10-CM | POA: Diagnosis not present

## 2023-01-13 DIAGNOSIS — D1801 Hemangioma of skin and subcutaneous tissue: Secondary | ICD-10-CM | POA: Diagnosis not present

## 2023-01-13 DIAGNOSIS — L821 Other seborrheic keratosis: Secondary | ICD-10-CM | POA: Diagnosis not present

## 2023-02-21 DIAGNOSIS — S060X0A Concussion without loss of consciousness, initial encounter: Secondary | ICD-10-CM | POA: Diagnosis not present

## 2023-02-24 ENCOUNTER — Encounter: Payer: Self-pay | Admitting: Sports Medicine

## 2023-02-24 ENCOUNTER — Ambulatory Visit: Payer: Medicare Other | Admitting: Sports Medicine

## 2023-02-24 DIAGNOSIS — Z905 Acquired absence of kidney: Secondary | ICD-10-CM

## 2023-02-24 DIAGNOSIS — M170 Bilateral primary osteoarthritis of knee: Secondary | ICD-10-CM

## 2023-02-24 DIAGNOSIS — G8929 Other chronic pain: Secondary | ICD-10-CM

## 2023-02-24 DIAGNOSIS — M25561 Pain in right knee: Secondary | ICD-10-CM

## 2023-02-24 DIAGNOSIS — M25562 Pain in left knee: Secondary | ICD-10-CM

## 2023-02-24 NOTE — Progress Notes (Signed)
 April Church - 83 y.o. female MRN [id number]  Date of birth: 1940/11/08  Office Visit Note: Visit Date: 02/24/2023 PCP: April Charm, MD Referred by: April Church,*  Subjective: Chief Complaint  Patient presents with   Right Knee - Follow-up   Left Knee - Follow-up   HPI: April Church is a pleasant 83 y.o. female who presents today for bilateral knee pain with advanced osteoarthritis.  Back on 01/12/23 we did proceed with bilateral knee CSI with hyaluronic acid injections which were quite helpful for her.  She is still having good pain relief from this but here over the last week or so notices it is starting to slowly wean in benefit.  She is using Tylenol  usually 1 tablet 3 times a day as needed, has used Voltaren in the past for this and her thumb.  She has seen Dr. Lovorn and has Tramadol  50mg  to take for breakthrough pain only.  Her pain is on the inner part of both knees.  Pertinent ROS were reviewed with the patient and found to be negative unless otherwise specified above in HPI.   Assessment & Plan: Visit Diagnoses:  1. Bilateral primary osteoarthritis of knee   2. Chronic pain of both knees   3. Single kidney    Plan: Impression is chronic bilateral knee pain with bone-on-bone osteoarthritis of bilateral knees which is most severe in the medial tibiofemoral compartment.  She did receive good relief from the combination of corticosteroid and hyaluronic acid injections.  We discussed the infrequent nature of these.  Given her single kidney from prior kidney donation, she should avoid NSAID use.  We discussed continuing Tylenol  as needed as well as getting back to using her topical Voltaren.  I would like her to continue remaining active as her pain allows.  She does have tramadol  50 mg through her pain management doctor that she may take for breakthrough pain only.  She will follow-up in about 2 months and we will plan for possible CSI injections  into each knee if needed for pain control.  Follow-up: Return in about 9 weeks (around 04/28/2023) for return for b/l knee injections (30-min).   Meds & Orders: No orders of the defined types were placed in this encounter.  No orders of the defined types were placed in this encounter.    Procedures: No procedures performed      Clinical History: No specialty comments available.  She reports that she has never smoked. She has never used smokeless tobacco. No results for input(s): HGBA1C, LABURIC in the last 8760 hours.  Objective:    Physical Exam  Gen: Well-appearing, in no acute distress; non-toxic CV: Well-perfused. Warm.  Resp: Breathing unlabored on room air; no wheezing. Psych: Fluid speech in conversation; appropriate affect; normal thought process  Ortho Exam - Bilateral knees: There is a trace effusion on the right knee, none on the left.  There is bony bossing of bilateral knees and the knees falling to a slightly valgus formation.  The right knee has a limited range of motion from about 8-95 degrees, left knee 5-110 degrees.  There is patellar crepitus noted.  There is some pseudo instability with knee valgus but no significant laxity.  Imaging:  - Previous imaging:  XR Knee Complete 4 Views Right 4 views of bilateral knees including AP standing, Rosenberg, lateral and  sunrise view were ordered and reviewed by myself today.  X-rays show  complete bone-on-bone arthritic change of the medial tibiofemoral joint  space as well as advanced tricompartmental change.  There is notable bony  sclerosis over the medial femoral condyle and to a lesser degree the  medial tibial plateau of bilateral knees.  No acute fracture noted.  There  is varus formation of both knees. XR Knee Complete 4 Views Left 4 views of bilateral knees including AP standing, Rosenberg, lateral and  sunrise view were ordered and reviewed by myself today.  X-rays show  complete bone-on-bone arthritic  change of the medial tibiofemoral joint  space as well as advanced tricompartmental change.  There is notable bony  sclerosis over the medial femoral condyle and to a lesser degree the  medial tibial plateau of bilateral knees.  No acute fracture noted.  There  is varus formation of both knees.   Past Medical/Family/Surgical/Social History: Medications & Allergies reviewed per EMR, new medications updated. Patient Active Problem List   Diagnosis Date Noted   Rectal bleeding 07/27/2022   Hemorrhoids 07/27/2022   Chronic pain of both knees 06/21/2022   Moderate persistent asthma without complication 03/27/2021   PSVT (paroxysmal supraventricular tachycardia) (HCC) 11/26/2020   Syncope and collapse 11/24/2020   Primary osteoarthritis of right knee 05/12/2020   Primary osteoarthritis of left knee 05/12/2020   Hyperlipidemia    Bilateral calf pain 03/05/2019   Genetic testing 02/13/2019   Family history of ovarian cancer    Family history of bladder cancer    Family history of colon cancer    Family history of leukemia    Lumbar radiculopathy 12/08/2018   Myofascial pain 12/08/2018   Impaired gait and mobility 12/08/2018   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   Chronic nonallergic rhinitis 12/29/2016   Mild persistent asthma, uncomplicated 12/29/2016   Vocal fold paralysis, 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 2011- no further tx. now- Dr. Melodye , Dr. Jason   Cyst of finger 11/2011   annular cyst  right long finger   Dental crowns present    Dermatitis    Frequency of urination    GERD (gastroesophageal reflux disease)    Hemorrhoid    History of colon polyps 2009   Also noted 2012 and 2017 by colonoscopy.   History of shingles 1971   Had right ischial recurrence in the March 2019   Hyperlipidemia    On atorvastatin    Hypertension    under control, has been on med. x 2 yrs.   Liposarcoma of left shoulder (HCC) 1969   Parkinson's disease (HCC)    With mild tremor (neurologist Dr. Margaret), neurosurgeon Dr. Mavis   PONV (postoperative nausea and vomiting)    Seasonal allergies    Shingles of eyelid 1971   Right eyelid; recurrent -> last episode 05/11/2017   Tremors of nervous system    hands-essential tremor; associated with Parkinson's.   Trigger finger of right hand 11/2011   long finger   Family History  Problem Relation Age of Onset   Kidney disease Mother    Stroke Father    Stroke Sister  08/2015   Diabetes Brother    Heart disease Brother    Ovarian cancer Sister 48   Heart disease Paternal Grandmother    Heart disease Paternal Grandfather    Leukemia Other 15       brother's grandson   Bladder Cancer Brother 27   Colon cancer Niece        dx in late 2s   Past Surgical History:  Procedure Laterality Date   14-day Zio Patch Monitor  08/2020   (report to be scanned): Predominant SR w/ HR 61 to 142 bpm and average 86 bpm.  Total of 59 episodes of PAT/PSVT  4 to 15 beats (not noted on diary).  Fastest was 5 beats at a rate of 193 bpm, longest was 15 beats at a rate of 106 bpm.  Rare isolated PACs (as well as couplets and triplets) with rare isolated PVCs.  No sustained arrhythmias or bradycardia to explain syncope.   ANTERIOR CERVICAL DECOMPRESSION/DISCECTOMY FUSION 4 LEVELS N/A 11/14/2013   Procedure: ANTERIOR CERVICAL DECOMPRESSION/DISCECTOMY FUSION 4 LEVELS;  Surgeon: Reyes Budge, MD;  Location: [facility];  Service: Neurosurgery;  Laterality:  N/A;  C34 C45 C56 C67 anterior cervical fusion with interbody prosthesis plating and  bonegraft   APPENDECTOMY  age 72   BREAST LUMPECTOMY  06/16/2009   left; SLN bx.   BREAST LUMPECTOMY  07/01/2009   re-excision   BREAST SURGERY  02/22/1997   reduction   CATARACT EXTRACTION, BILATERAL     COLONOSCOPY WITH PROPOFOL  N/A 12/03/2014   Procedure: COLONOSCOPY WITH PROPOFOL ;  Surgeon: Gladis MARLA Louder, MD;  Location: [facility];  Service: Endoscopy;  Laterality: N/A;   FOOT SURGERY  06/22/2011   left   KNEE ARTHROSCOPY  03/12/2005   right   KNEE ARTHROSCOPY     left   Lower Extremity Venous Dopplers  12/12/2020   No DVT bilaterally in the deep veins or superficial veins.  No deep or superficial venous reflux noted bilaterally with exception of Right SSV at the knee.   NASAL SINUS SURGERY     x 2   NEPHRECTOMY LIVING DONOR Left 04/22/2008   donated to spouse 2010([facility])   TRANSTHORACIC ECHOCARDIOGRAM  12/10/2020   EF 60 to 65%.  Normal LV size and function.  No heart WMA.  GR 1 DD.  Normal RV, RVP and RAP.SABRA  Normal valves.   TRIGGER FINGER RELEASE  12/16/2011   Procedure: RELEASE TRIGGER FINGER/A-1 PULLEY;  Surgeon: Franky JONELLE Curia, MD;  Location:  [facility];  Service: Orthopedics;  Laterality: Right;  RIGHT LONG FINGER TRIGGER RELEASE & ANNULAR CYST EXCISION   TUMOR EXCISION  age 84   right arm   Social History   Occupational History   Occupation: Retired   Occupation: Housewife  Tobacco Use   Smoking status: Never   Smokeless tobacco: Never  Vaping Use   Vaping status: Never Used  Substance and Sexual Activity   Alcohol use: No    Alcohol/week: 0.0 standard drinks of alcohol   Drug use: No   Sexual activity: Not Currently

## 2023-02-24 NOTE — Progress Notes (Signed)
 Patient said that the injections relieved her of her knee pain completely until about 1 week ago. She says that she began gradually having knee pain again on the medial aspect of both knees.  Patient did have a fall and hit her head. She says she was seen at a walk-in clinic for evaluation.

## 2023-03-10 ENCOUNTER — Emergency Department (HOSPITAL_COMMUNITY): Payer: Medicare Other

## 2023-03-10 ENCOUNTER — Telehealth: Payer: Self-pay | Admitting: Cardiology

## 2023-03-10 ENCOUNTER — Emergency Department (HOSPITAL_COMMUNITY)
Admission: EM | Admit: 2023-03-10 | Discharge: 2023-03-10 | Disposition: A | Discharge status: Home / Self Care | Payer: Medicare Other | Attending: Emergency Medicine | Admitting: Emergency Medicine

## 2023-03-10 ENCOUNTER — Other Ambulatory Visit: Payer: Self-pay

## 2023-03-10 ENCOUNTER — Telehealth: Payer: Self-pay | Admitting: Neurology

## 2023-03-10 ENCOUNTER — Encounter (HOSPITAL_COMMUNITY): Payer: Self-pay

## 2023-03-10 DIAGNOSIS — J45909 Unspecified asthma, uncomplicated: Secondary | ICD-10-CM | POA: Insufficient documentation

## 2023-03-10 DIAGNOSIS — W01198A Fall on same level from slipping, tripping and stumbling with subsequent striking against other object, initial encounter: Secondary | ICD-10-CM | POA: Diagnosis not present

## 2023-03-10 DIAGNOSIS — Z79899 Other long term (current) drug therapy: Secondary | ICD-10-CM | POA: Insufficient documentation

## 2023-03-10 DIAGNOSIS — R251 Tremor, unspecified: Secondary | ICD-10-CM | POA: Insufficient documentation

## 2023-03-10 DIAGNOSIS — Y9301 Activity, walking, marching and hiking: Secondary | ICD-10-CM | POA: Insufficient documentation

## 2023-03-10 DIAGNOSIS — I10 Essential (primary) hypertension: Secondary | ICD-10-CM | POA: Diagnosis not present

## 2023-03-10 DIAGNOSIS — Y92094 Garage of other non-institutional residence as the place of occurrence of the external cause: Secondary | ICD-10-CM | POA: Diagnosis not present

## 2023-03-10 DIAGNOSIS — S0990XA Unspecified injury of head, initial encounter: Secondary | ICD-10-CM | POA: Diagnosis not present

## 2023-03-10 DIAGNOSIS — S0083XA Contusion of other part of head, initial encounter: Secondary | ICD-10-CM | POA: Diagnosis not present

## 2023-03-10 DIAGNOSIS — S0003XA Contusion of scalp, initial encounter: Secondary | ICD-10-CM | POA: Diagnosis not present

## 2023-03-10 DIAGNOSIS — Z7951 Long term (current) use of inhaled steroids: Secondary | ICD-10-CM | POA: Insufficient documentation

## 2023-03-10 DIAGNOSIS — Z853 Personal history of malignant neoplasm of breast: Secondary | ICD-10-CM | POA: Diagnosis not present

## 2023-03-10 DIAGNOSIS — Z981 Arthrodesis status: Secondary | ICD-10-CM | POA: Diagnosis not present

## 2023-03-10 DIAGNOSIS — W19XXXA Unspecified fall, initial encounter: Secondary | ICD-10-CM

## 2023-03-10 NOTE — Telephone Encounter (Signed)
Called and spoke with daughter Elita Quick.  She states patient had a fall on 12/30 and went to [facility] for treatment. She states patient had another fall last night in her garage and bumped her head. She wants to see Dr Herbie Baltimore to evaluate bloood pressure meds. Will check schedule and make an appointment and call them back.

## 2023-03-10 NOTE — Discharge Instructions (Signed)
The CAT scans today were normal without any signs of bleeding on the brain or broken bones.  The bruise will be there for weeks but will get better on its own.  Because it seems that you may be a little worse after stopping those medications you should call Dr. Don Perking office for a follow-up.  Remove all loose rugs or anything that you could potentially trip over.  If you start having severe headaches, vomiting, new confusion or other concerns return to the emergency room.

## 2023-03-10 NOTE — Telephone Encounter (Signed)
Daughter Elita Quick) would like an earlier visit for mom if possible. Patient has fallen twice 12/30 and 1/15, went to ER, CT scan CLEAR (just bruising).

## 2023-03-10 NOTE — ED Provider Notes (Signed)
[place] EMERGENCY DEPARTMENT AT [facility] Provider Note   CSN: [id number] Arrival date & time: 03/10/23  1059     History  Chief Complaint  Patient presents with   Fall   Head Injury    April Church is a 83 y.o. female.  Patient is an 83 year old female with a history of essential tremor, hypertension, asthma, prior breast cancer no longer undergoing treatment who is presenting today after a fall yesterday.  Patient reports that she had come home after having dinner with her grandson where she felt totally normal.  She was walking in the garage and reports the next thing she knew she was lying on the floor.  Family reports it looks like she most likely slipped on a rug that was by the door and hit her head.  She was able to call her daughter shortly after the fall and when the neighbors came over she was on the steps near the door that went into the house and they helped her in.  Patient has a mild headache but denies any visual changes, nausea or vomiting.  She does not take any anticoagulation.  She does report she had a fall about 2 weeks ago where she was taking the garbage out and must of lost her balance on the uneven pavement.  Her son reports that she recently was taken off some of her medications by Dr. Arbutus Leas because they did not feel that she had Parkinson's disease and thought it was more related to a essential tremor however family feels that her balance and walking has gotten a little worse since that time.  Patient has been eating and drinking normally denies any other medication changes.  She denies any urinary symptoms, abdominal pain, cough, fever or shortness of breath.  No palpitations or dizziness prior to or after the fall.  The history is provided by the patient.  Fall  Head Injury      Home Medications Prior to Admission medications   Medication Sig Start Date End Date Taking? Authorizing Provider  albuterol (VENTOLIN HFA) 108 (90 Base)  MCG/ACT inhaler Inhale 1-2 puffs into the lungs every 6 (six) hours as needed for wheezing or shortness of breath. 11/03/22   Marcelyn Bruins, MD  amLODipine (NORVASC) 2.5 MG tablet Take one tablet each day. 07/14/21   Marykay Lex, MD  atorvastatin (LIPITOR) 20 MG tablet Take 20 mg by mouth daily.    [provider]  budesonide-formoterol (SYMBICORT) 160-4.5 MCG/ACT inhaler Inhale 2 puffs into the lungs as needed. 11/03/22   Marcelyn Bruins, MD  CALCIUM CARBONATE PO Take 1 tablet by mouth 2 (two) times daily.    [provider]  Cholecalciferol (VITAMIN D) 1000 UNITS capsule Take 1,000 Units by mouth 2 (two) times daily.    [provider]  cyclobenzaprine (FLEXERIL) 5 MG tablet TAKE 1 TO UP TO 2 TABLETS BY MOUTH ONCE DAILY AT BEDTIME AS NEEDED FOR MUSCLE SPASM 09/08/21   Lovorn, Aundra Millet, MD  EPINEPHrine 0.3 mg/0.3 mL IJ SOAJ injection Inject 0.3 mg into the muscle as needed. 09/25/21   Marcelyn Bruins, MD  famotidine (PEPCID) 20 MG tablet Take 1 tablet (20 mg total) by mouth daily. 11/03/22   Marcelyn Bruins, MD  fluticasone (FLOVENT HFA) 110 MCG/ACT inhaler 1 puff    [provider]  hydrocortisone (ANUSOL-HC) 2.5 % rectal cream Place 1 Application rectally 2 (two) times daily. 01/03/23   Imogene Burn, MD  ipratropium (  ATROVENT) 0.03 % nasal spray USE 2 SPRAY(S) IN EACH NOSTRIL TWICE DAILY 06/23/22   Marcelyn Bruins, MD  ketotifen (ZADITOR) 0.025 % ophthalmic solution Apply 1 drop to eye daily.    [provider]  levocetirizine (XYZAL) 5 MG tablet TAKE 1 TABLET BY MOUTH ONCE DAILY IN THE EVENING . APPOINTMENT REQUIRED FOR FUTURE REFILLS 04/05/22   Marcelyn Bruins, MD  metoprolol succinate (TOPROL-XL) 25 MG 24 hr tablet Take 12.5 mg by mouth daily. Take 1/2 Tablet In The Morning    [provider]  Na Sulfate-K Sulfate-Mg Sulf 17.5-3.13-1.6 GM/177ML SOLN Use as directed; may use generic;  goodrx card if insurance will not cover generic 09/13/22   Imogene Burn, MD  nystatin ointment (MYCOSTATIN) Apply 1 Application topically 2 (two) times daily.    [provider]  Polyethyl Glycol-Propyl Glycol (SYSTANE OP) Place 1 drop into both eyes 2 (two) times daily.    [provider]  primidone (MYSOLINE) 50 MG tablet 3 in the AM, 3 at noon, 2 at night 11/17/22   Tat, Rebecca S, DO  traMADol (ULTRAM) 50 MG tablet TAKE 1 TABLET BY MOUTH EVERY 12 HOURS AS NEEDED 06/29/22   Lovorn, Aundra Millet, MD  triamcinolone ointment (KENALOG) 0.1 % Apply 1 Application topically as needed.    [provider]  venlafaxine (EFFEXOR) 75 MG tablet Take 1 tablet (75 mg total) by mouth daily. 09/12/20   Lovorn, Aundra Millet, MD      Allergies    Shellfish allergy, Duloxetine, Sulfa drugs cross reactors, Lisinopril, Lyrica [pregabalin], Other, Sulfa antibiotics, and Codeine    Review of Systems   Review of Systems  Physical Exam Updated Vital Signs BP 117/84 (BP Location: Right Arm)   Pulse 92   Temp 98.3 F (36.8 C) (Oral)   Resp 17   SpO2 91%  Physical Exam Vitals and nursing note reviewed.  Constitutional:      General: She is not in acute distress.    Appearance: She is well-developed.  HENT:     Head: Normocephalic.   Eyes:     Extraocular Movements: Extraocular movements intact.     Pupils: Pupils are equal, round, and reactive to light.     Comments: Pupils are 3+ and reactive briskly to light.  Cardiovascular:     Rate and Rhythm: Normal rate and regular rhythm.     Heart sounds: Normal heart sounds. No murmur heard.    No friction rub.  Pulmonary:     Effort: Pulmonary effort is normal.     Breath sounds: Normal breath sounds. No wheezing or rales.  Abdominal:     General: Bowel sounds are normal. There is no distension.     Palpations: Abdomen is soft.     Tenderness: There is no abdominal tenderness. There is no guarding or rebound.  Musculoskeletal:         General: No tenderness. Normal range of motion.     Cervical back: Normal range of motion and neck supple. No tenderness.     Comments: No edema  Skin:    General: Skin is warm and dry.     Findings: No rash.  Neurological:     Mental Status: She is alert and oriented to person, place, and time. Mental status is at baseline.     Cranial Nerves: No cranial nerve deficit.     Sensory: No sensory deficit.     Motor: No weakness.     Gait: Gait normal.  Comments: 5 out of 5 strength in all 4 extremities.  No noted pronator drift.  No aphasia.  Normal heel-to-shin bilaterally.  Essential tremor noted in the right upper extremity worse with activity and improved with rest  Psychiatric:        Behavior: Behavior normal.     ED Results / Procedures / Treatments   Labs (all labs ordered are listed, but only abnormal results are displayed) Labs Reviewed - No data to display  EKG None  Radiology CT Head Wo Contrast Result Date: 03/10/2023 CLINICAL DATA:  Head trauma, minor (Age >= 65y) Head trauma, moderate-severe; Facial trauma, blunt EXAM: CT HEAD WITHOUT CONTRAST CT MAXILLOFACIAL WITHOUT CONTRAST TECHNIQUE: Multidetector CT imaging of the head and maxillofacial structures were performed using the standard protocol without intravenous contrast. Multiplanar CT image reconstructions of the maxillofacial structures were also generated. RADIATION DOSE REDUCTION: This exam was performed according to the departmental dose-optimization program which includes automated exposure control, adjustment of the mA and/or kV according to patient size and/or use of iterative reconstruction technique. COMPARISON:  None Available. FINDINGS: CT HEAD FINDINGS Brain: No hemorrhage. No hydrocephalus. No extra-axial fluid collection. No mass effect. No mass lesion. No CT evidence of an acute cortical infarct. Vascular: No hyperdense vessel or unexpected calcification. Skull: Large soft tissue hematoma along the left  frontal scalp and periorbital soft tissues. Other: None. CT MAXILLOFACIAL FINDINGS Osseous: No fracture or mandibular dislocation. No destructive process. Mild retrolisthesis of C2 on C3. Partially imaged cervical spinal fusion hardware. Orbits: Negative. No traumatic or inflammatory finding. Sinuses: No middle ear or mastoid effusion. Mild mucosal thickening in the bilateral maxillary and ethmoid sinuses. Postsurgical changes from bilateral inferior ethmoidectomies, sphenoidotomies, and bilateral maxillary antrostomies. Soft tissues: Soft tissue hematoma in the periorbital soft tissues on the left. IMPRESSION: 1. No CT evidence of intracranial injury. 2. Large soft tissue hematoma along the left frontal scalp and periorbital soft tissues. No underlying calvarial fracture. 3. No acute facial bone fracture. Electronically Signed   By: Lorenza Cambridge M.D.   On: 03/10/2023 14:32   CT Maxillofacial Wo Contrast Result Date: 03/10/2023 CLINICAL DATA:  Head trauma, minor (Age >= 65y) Head trauma, moderate-severe; Facial trauma, blunt EXAM: CT HEAD WITHOUT CONTRAST CT MAXILLOFACIAL WITHOUT CONTRAST TECHNIQUE: Multidetector CT imaging of the head and maxillofacial structures were performed using the standard protocol without intravenous contrast. Multiplanar CT image reconstructions of the maxillofacial structures were also generated. RADIATION DOSE REDUCTION: This exam was performed according to the departmental dose-optimization program which includes automated exposure control, adjustment of the mA and/or kV according to patient size and/or use of iterative reconstruction technique. COMPARISON:  None Available. FINDINGS: CT HEAD FINDINGS Brain: No hemorrhage. No hydrocephalus. No extra-axial fluid collection. No mass effect. No mass lesion. No CT evidence of an acute cortical infarct. Vascular: No hyperdense vessel or unexpected calcification. Skull: Large soft tissue hematoma along the left frontal scalp and  periorbital soft tissues. Other: None. CT MAXILLOFACIAL FINDINGS Osseous: No fracture or mandibular dislocation. No destructive process. Mild retrolisthesis of C2 on C3. Partially imaged cervical spinal fusion hardware. Orbits: Negative. No traumatic or inflammatory finding. Sinuses: No middle ear or mastoid effusion. Mild mucosal thickening in the bilateral maxillary and ethmoid sinuses. Postsurgical changes from bilateral inferior ethmoidectomies, sphenoidotomies, and bilateral maxillary antrostomies. Soft tissues: Soft tissue hematoma in the periorbital soft tissues on the left. IMPRESSION: 1. No CT evidence of intracranial injury. 2. Large soft tissue hematoma along the left frontal scalp and periorbital soft tissues.  No underlying calvarial fracture. 3. No acute facial bone fracture. Electronically Signed   By: Lorenza Cambridge M.D.   On: 03/10/2023 14:32    Procedures Procedures    Medications Ordered in ED Medications - No data to display  ED Course/ Medical Decision Making/ A&P                                 Medical Decision Making Amount and/or Complexity of Data Reviewed Radiology: ordered and independent interpretation performed. Decision-making details documented in ED Course.   Pt with multiple medical problems and comorbidities and presenting today with a complaint that caries a high risk for morbidity and mortality.  Today after a fall yesterday.  Patient seems to remember the event and had no preceding symptoms with lower suspicion for dysrhythmia or hypotension as the cause of her symptoms.  She has significant injury to the left side of her face but does not take anticoagulation.  No focal neurologic findings today except for a tremor in her right hand which is been ongoing for quite some time and she sees Dr. Arbutus Leas with neurology for that.  Concern for skull fracture or intracranial hemorrhage. I have independently visualized and interpreted pt's images today.  X-rays today with a  large hematoma over the left forehead but no intracranial hemorrhage. Radiology reports large soft tissue hematoma along the left frontal scalp and periorbital soft tissues.  No facial or calvarial fractures noted.  Findings discussed with the family.  At this time feel that patient is stable for discharge home.  However given they are concerned about her worsening mobility after stopping certain medications that she should follow back up with Dr. Arbutus Leas.  Low suspicion for an acute infectious cause for her fall today or new acute electrolyte abnormalities.         Final Clinical Impression(s) / ED Diagnoses Final diagnoses:  Fall, initial encounter  Contusion of face, initial encounter    Rx / DC Orders ED Discharge Orders     None         Gwyneth Sprout, MD 03/10/23 1512

## 2023-03-10 NOTE — Telephone Encounter (Signed)
Pt's son in law would like a c/b in regards to pt being seen due to a fall that she had yesterday. Please advise

## 2023-03-10 NOTE — ED Triage Notes (Signed)
Pt states she tripped in her garage last night and hit her face on the concrete floor. Pt has bruising to left eye with swelling. Pt does remember if she passed out. Pt denies taking any blood thinners. No other injuries from the fall.

## 2023-03-10 NOTE — Telephone Encounter (Signed)
Called back and spoke with son in law.  He clarified that patient did fall in garage last night and bumped her head and has a goose egg. Advised that since patient did have a fall with injury it is best that she go to the urgent care or ER to be evaluated. No office appointments available today as well. SIL agreed to take patient for an urgent evaluation,

## 2023-03-11 NOTE — Telephone Encounter (Signed)
Her son reports that she recently was taken off some of her medications by Dr. Arbutus Leas because they did not feel that she had Parkinson's disease and thought it was more related to a essential tremor however family feels that her balance and walking has gotten a little worse since that time.  Hospital notes are telling patients family to follow up here with you. Looks like you see patient for Essential tremor but she has had a full PD work up

## 2023-03-11 NOTE — Progress Notes (Deleted)
Assessment/Plan:   1.  Tremor  -Patient previously saw Dr. Marjory Lies and Dr. Rubin Payor             -Patient with longstanding history of essential tremor             -Patient's skin biopsy was negative for alpha-synuclein             -Patient had DaTscan at [facility] in February, 2020 and it stated that radiotracer uptake was mildly decreased bilaterally, but felt to be 2 standard deviations within age-matched controls.             -Records indicate that Dr. Rubin Payor did on/off testing in February, 2020 and felt that levodopa helped tremor but did not help bradykinesia (UPDRS off score was 40 and on score was still 31).  DBS was offered and declined at that time because patient's husband was ill.  -Levodopa was stopped in August, 2024.  The patient had some falls in January, 2025 and family wondered if related to stopping levodopa, but the falls really seemed to be falls that were truly reasonable; 1 was on uneven ground while taking out the trash and 1 occurred while tripping over a rug at a front door.  My suspicion is that the patient has significant enough essential tremor that it has affected the cerebellar climbing fibers, which can affect balance.  Nonetheless, I told the patient and her family that we can certainly retry levodopa if they want.  -discussed dbs and focused ultrasound today.  Focused ultrasound may be the better option for her, given age but balance would be a concern  she is interested but wants to discuss with her daughter.  Information on focused ultrasound given             -Neurocognitive testing at [facility] February, 2020 with evidence of MCI, but concerns for developing Alzheimer's.  I did not see evidence of that today  -increase primidone slowly to 50 mg, 3/3/2   2.  Spasmodic dysphonia             -Doing Botox at [facility] and has been doing this since 2017   3.  Hemifacial spasm, left             -Not bothersome to patient.  She has a  little bit of blepharospasm as well   Subjective:   [name] was seen for ER follow-up at the end of last week.  Records are reviewed.  Patient was seen in the emergency room for a fall.  Medical records indicate that patient slipped on a rug that was by her door and fell and hit her head.  She was able to call her daughter and the neighbors also came over and helped her.  Intracranially, the CT brain was negative.  There was a large soft tissue hematoma along the left frontal scalp and periorbital soft tissues from hitting her head.  She was discharged from the emergency room.  Emergency room notes indicate that patient fell 2 weeks prior to that taking out her garbage and tripped on uneven pavement.  She did not hit her head at that time.  Emergency room notes indicated that family thought that patient had done worse since our last visit and stated that I had taken her off of medication.  Last visit I did not take her off of any medication, but I did increase her primidone.  We did however, wean her off  of levodopa in early August, 2024.  Current prescribed movement disorder medications: primidone 50 mg, 3/3/2 (an increase from 2 tablets 3 times per day) On low dose metoprolol (only 12.5 mg daily)   PREVIOUS MEDICATIONS:  primidone, up to 250 mg twice per day; carbidopa/levodopa ; entacapone   ALLERGIES:   Allergies  Allergen Reactions   Shellfish Allergy Shortness Of Breath   Duloxetine Itching and Rash   Sulfa Drugs Cross Reactors Anxiety and Rash   Lisinopril     Other reaction(s): cough Other reaction(s): cough Other reaction(s): cough   Lyrica [Pregabalin] Other (See Comments)    "made me drunk, couldn't function"   Other     Other reaction(s): hyper   Sulfa Antibiotics Other (See Comments)    Other reaction(s): hyper, break out in a rash Other reaction(s): hyper, break out in a rash   Codeine Anxiety    Other reaction(s): hyper Other reaction(s): hyper     CURRENT MEDICATIONS:  No outpatient medications have been marked as taking for the 03/14/23 encounter (Appointment) with Giana Castner, Octaviano Batty, DO.     Objective:   PHYSICAL EXAMINATION:    VITALS:   There were no vitals filed for this visit.   GEN:  The patient appears stated age and is in NAD. HEENT:  Normocephalic, atraumatic.  The mucous membranes are moist. The superficial temporal arteries are without ropiness or tenderness. CV:  RRR Lungs:  CTAB Neck/HEME:  There are no carotid bruits bilaterally.  Neurological examination:  Orientation: The patient is alert and oriented x3. Cranial nerves: There is good facial symmetry without facial hypomimia. The speech is fluent and clear. Soft palate rises symmetrically and there is no tongue deviation. Hearing is intact to conversational tone. Sensation: Sensation is intact to light touch throughout Motor: Strength is at least antigravity x4.  Movement examination: Tone: There is nl tone in the bilateral upper extremities.  The tone in the lower extremities is nl.  Abnormal movements: there is b/l UE rest tremor (doesn't increase with distraction), R>L.  There is mild postural and at least mod intention tremor.  She has trouble getting the pen to the paper for archimedes spirals, esp on the right.  Coordination:  There is difficulty only with finger taps and that was not decremation but she had trouble doing it b/c of tremor.  All other RAMs are nl with any form of RAMS, including alternating supination and pronation of the forearm, hand opening and closing, heel taps and toe taps bilaterally  Gait and Station: The patient pushes off to arise.  The patient's stride length is decreased but no shuffling. She is somewhat unstable  I have reviewed and interpreted the following labs independently    Chemistry      Component Value Date/Time   NA 142 07/30/2020 1141   NA 136 01/20/2015 1326   K 4.5 07/30/2020 1141   K 4.1 01/20/2015 1326    CL 100 07/30/2020 1141   CL 102 06/30/2012 1231   CO2 25 07/30/2020 1141   CO2 27 01/20/2015 1326   BUN 11 07/30/2020 1141   BUN 13.9 01/20/2015 1326   CREATININE 0.65 07/30/2020 1141   CREATININE 0.9 01/20/2015 1326      Component Value Date/Time   CALCIUM 9.1 07/30/2020 1141   CALCIUM 8.8 01/20/2015 1326   ALKPHOS 86 01/20/2015 1326   AST 17 01/20/2015 1326   ALT 13 01/20/2015 1326   BILITOT <0.30 01/20/2015 1326       Lab  Results  Component Value Date   WBC 5.6 09/13/2022   HGB 12.9 09/13/2022   HCT 39.7 09/13/2022   MCV 93.0 09/13/2022   PLT 316.0 09/13/2022    No results found for: "TSH"   Total time spent on today's visit was *** minutes, including both face-to-face time and nonface-to-face time.  Time included that spent on review of records (prior notes available to me/labs/imaging if pertinent), discussing treatment and goals, answering patient's questions and coordinating care.  Cc:  Lorenda Ishihara, MD

## 2023-03-14 ENCOUNTER — Ambulatory Visit: Payer: Medicare Other | Admitting: Neurology

## 2023-03-14 NOTE — Progress Notes (Unsigned)
Assessment/Plan:   1.  Tremor  -Patient previously saw Dr. Marjory Lies and Dr. Rubin Payor             -Patient with longstanding history of essential tremor             -Patient's skin biopsy was negative for alpha-synuclein             -Patient had DaTscan at [facility] in February, 2020 and it stated that radiotracer uptake was mildly decreased bilaterally, but felt to be 2 standard deviations within age-matched controls.             -Records indicate that Dr. Rubin Payor did on/off testing in February, 2020 and felt that levodopa helped tremor but did not help bradykinesia (UPDRS off score was 40 and on score was still 31).  DBS was offered and declined at that time because patient's husband was ill.  -Levodopa was stopped in August, 2024.  The patient had some falls in January, 2025 and family wondered if related to stopping levodopa, but the falls really seemed to be falls that were truly reasonable; 1 was on uneven ground while taking out the trash and 1 occurred while tripping over a rug at a front door.  My suspicion is that the patient has significant enough essential tremor that it has affected the cerebellar climbing fibers, which can affect balance.  Nonetheless, I told the patient and her family that we can certainly retry levodopa if they want.  -discussed dbs and focused ultrasound today.  Focused ultrasound may be the better option for her, given age but balance would be a concern  she is interested but wants to discuss with her daughter.  Information on focused ultrasound given             -Neurocognitive testing at [facility] February, 2020 with evidence of MCI, but concerns for developing Alzheimer's.  I did not see evidence of that today  -increase primidone slowly to 50 mg, 3/3/2   2.  Spasmodic dysphonia             -Doing Botox at [facility] and has been doing this since 2017   3.  Hemifacial spasm, left             -Not bothersome to patient.  She has a  little bit of blepharospasm as well   Subjective:   MARCELYN PULLAR was seen for ER follow-up at the end of last week.  Records are reviewed.  Patient was seen in the emergency room for a fall.  Medical records indicate that patient slipped on a rug that was by her door and fell and hit her head.  She was able to call her daughter and the neighbors also came over and helped her.  Intracranially, the CT brain was negative.  There was a large soft tissue hematoma along the left frontal scalp and periorbital soft tissues from hitting her head.  She was discharged from the emergency room.  Emergency room notes indicate that patient fell 2 weeks prior to that taking out her garbage and tripped on uneven pavement.  She did not hit her head at that time.  Emergency room notes indicated that family thought that patient had done worse since our last visit and stated that I had taken her off of medication.  Last visit I did not take her off of any medication, but I did increase her primidone.  We did however, wean her off  of levodopa in early August, 2024.  Current prescribed movement disorder medications: primidone 50 mg, 3/3/2 (an increase from 2 tablets 3 times per day) On low dose metoprolol (only 12.5 mg daily)   PREVIOUS MEDICATIONS:  primidone, up to 250 mg twice per day; carbidopa/levodopa ; entacapone   ALLERGIES:   Allergies  Allergen Reactions   Shellfish Allergy Shortness Of Breath   Duloxetine Itching and Rash   Sulfa Drugs Cross Reactors Anxiety and Rash   Lisinopril     Other reaction(s): cough Other reaction(s): cough Other reaction(s): cough   Lyrica [Pregabalin] Other (See Comments)    "made me drunk, couldn't function"   Other     Other reaction(s): hyper   Sulfa Antibiotics Other (See Comments)    Other reaction(s): hyper, break out in a rash Other reaction(s): hyper, break out in a rash   Codeine Anxiety    Other reaction(s): hyper Other reaction(s): hyper     CURRENT MEDICATIONS:  No outpatient medications have been marked as taking for the 03/15/23 encounter (Appointment) with Emmaus Brandi, Octaviano Batty, DO.     Objective:   PHYSICAL EXAMINATION:    VITALS:   There were no vitals filed for this visit.   GEN:  The patient appears stated age and is in NAD. HEENT:  Normocephalic, atraumatic.  The mucous membranes are moist. The superficial temporal arteries are without ropiness or tenderness. CV:  RRR Lungs:  CTAB Neck/HEME:  There are no carotid bruits bilaterally.  Neurological examination:  Orientation: The patient is alert and oriented x3. Cranial nerves: There is good facial symmetry without facial hypomimia. The speech is fluent and clear. Soft palate rises symmetrically and there is no tongue deviation. Hearing is intact to conversational tone. Sensation: Sensation is intact to light touch throughout Motor: Strength is at least antigravity x4.  Movement examination: Tone: There is nl tone in the bilateral upper extremities.  The tone in the lower extremities is nl.  Abnormal movements: there is b/l UE rest tremor (doesn't increase with distraction), R>L.  There is mild postural and at least mod intention tremor.  She has trouble getting the pen to the paper for archimedes spirals, esp on the right.  Coordination:  There is difficulty only with finger taps and that was not decremation but she had trouble doing it b/c of tremor.  All other RAMs are nl with any form of RAMS, including alternating supination and pronation of the forearm, hand opening and closing, heel taps and toe taps bilaterally  Gait and Station: The patient pushes off to arise.  The patient's stride length is decreased but no shuffling. She is somewhat unstable  I have reviewed and interpreted the following labs independently    Chemistry      Component Value Date/Time   NA 142 07/30/2020 1141   NA 136 01/20/2015 1326   K 4.5 07/30/2020 1141   K 4.1 01/20/2015 1326    CL 100 07/30/2020 1141   CL 102 06/30/2012 1231   CO2 25 07/30/2020 1141   CO2 27 01/20/2015 1326   BUN 11 07/30/2020 1141   BUN 13.9 01/20/2015 1326   CREATININE 0.65 07/30/2020 1141   CREATININE 0.9 01/20/2015 1326      Component Value Date/Time   CALCIUM 9.1 07/30/2020 1141   CALCIUM 8.8 01/20/2015 1326   ALKPHOS 86 01/20/2015 1326   AST 17 01/20/2015 1326   ALT 13 01/20/2015 1326   BILITOT <0.30 01/20/2015 1326       Lab  Results  Component Value Date   WBC 5.6 09/13/2022   HGB 12.9 09/13/2022   HCT 39.7 09/13/2022   MCV 93.0 09/13/2022   PLT 316.0 09/13/2022    No results found for: "TSH"   Total time spent on today's visit was *** minutes, including both face-to-face time and nonface-to-face time.  Time included that spent on review of records (prior notes available to me/labs/imaging if pertinent), discussing treatment and goals, answering patient's questions and coordinating care.  Cc:  Lorenda Ishihara, MD

## 2023-03-15 ENCOUNTER — Encounter: Payer: Self-pay | Admitting: Neurology

## 2023-03-15 ENCOUNTER — Ambulatory Visit (INDEPENDENT_AMBULATORY_CARE_PROVIDER_SITE_OTHER): Payer: Medicare Other | Admitting: Neurology

## 2023-03-15 VITALS — BP 132/70 | Ht 60.0 in | Wt 125.0 lb

## 2023-03-15 DIAGNOSIS — G250 Essential tremor: Secondary | ICD-10-CM | POA: Diagnosis not present

## 2023-03-15 DIAGNOSIS — R296 Repeated falls: Secondary | ICD-10-CM | POA: Diagnosis not present

## 2023-03-15 DIAGNOSIS — R269 Unspecified abnormalities of gait and mobility: Secondary | ICD-10-CM

## 2023-03-15 DIAGNOSIS — R2689 Other abnormalities of gait and mobility: Secondary | ICD-10-CM

## 2023-03-20 DIAGNOSIS — Z96651 Presence of right artificial knee joint: Secondary | ICD-10-CM | POA: Diagnosis not present

## 2023-03-20 DIAGNOSIS — J45909 Unspecified asthma, uncomplicated: Secondary | ICD-10-CM | POA: Diagnosis not present

## 2023-03-20 DIAGNOSIS — Z905 Acquired absence of kidney: Secondary | ICD-10-CM | POA: Diagnosis not present

## 2023-03-20 DIAGNOSIS — M4722 Other spondylosis with radiculopathy, cervical region: Secondary | ICD-10-CM | POA: Diagnosis not present

## 2023-03-20 DIAGNOSIS — G8929 Other chronic pain: Secondary | ICD-10-CM | POA: Diagnosis not present

## 2023-03-20 DIAGNOSIS — Z853 Personal history of malignant neoplasm of breast: Secondary | ICD-10-CM | POA: Diagnosis not present

## 2023-03-20 DIAGNOSIS — I10 Essential (primary) hypertension: Secondary | ICD-10-CM | POA: Diagnosis not present

## 2023-03-20 DIAGNOSIS — M4712 Other spondylosis with myelopathy, cervical region: Secondary | ICD-10-CM | POA: Diagnosis not present

## 2023-03-20 DIAGNOSIS — G250 Essential tremor: Secondary | ICD-10-CM | POA: Diagnosis not present

## 2023-03-20 DIAGNOSIS — R490 Dysphonia: Secondary | ICD-10-CM | POA: Diagnosis not present

## 2023-03-20 DIAGNOSIS — Z9181 History of falling: Secondary | ICD-10-CM | POA: Diagnosis not present

## 2023-03-20 DIAGNOSIS — M5416 Radiculopathy, lumbar region: Secondary | ICD-10-CM | POA: Diagnosis not present

## 2023-03-20 DIAGNOSIS — F32A Depression, unspecified: Secondary | ICD-10-CM | POA: Diagnosis not present

## 2023-03-20 DIAGNOSIS — M545 Low back pain, unspecified: Secondary | ICD-10-CM | POA: Diagnosis not present

## 2023-03-20 DIAGNOSIS — G5132 Clonic hemifacial spasm, left: Secondary | ICD-10-CM | POA: Diagnosis not present

## 2023-03-20 DIAGNOSIS — M1990 Unspecified osteoarthritis, unspecified site: Secondary | ICD-10-CM | POA: Diagnosis not present

## 2023-03-20 DIAGNOSIS — M25562 Pain in left knee: Secondary | ICD-10-CM | POA: Diagnosis not present

## 2023-03-20 DIAGNOSIS — R1310 Dysphagia, unspecified: Secondary | ICD-10-CM | POA: Diagnosis not present

## 2023-03-20 DIAGNOSIS — I471 Supraventricular tachycardia, unspecified: Secondary | ICD-10-CM | POA: Diagnosis not present

## 2023-03-20 DIAGNOSIS — M25561 Pain in right knee: Secondary | ICD-10-CM | POA: Diagnosis not present

## 2023-03-20 DIAGNOSIS — E785 Hyperlipidemia, unspecified: Secondary | ICD-10-CM | POA: Diagnosis not present

## 2023-03-20 DIAGNOSIS — E039 Hypothyroidism, unspecified: Secondary | ICD-10-CM | POA: Diagnosis not present

## 2023-03-28 DIAGNOSIS — M545 Low back pain, unspecified: Secondary | ICD-10-CM | POA: Diagnosis not present

## 2023-03-28 DIAGNOSIS — G5132 Clonic hemifacial spasm, left: Secondary | ICD-10-CM | POA: Diagnosis not present

## 2023-03-28 DIAGNOSIS — M25561 Pain in right knee: Secondary | ICD-10-CM | POA: Diagnosis not present

## 2023-03-28 DIAGNOSIS — G250 Essential tremor: Secondary | ICD-10-CM | POA: Diagnosis not present

## 2023-03-28 DIAGNOSIS — G8929 Other chronic pain: Secondary | ICD-10-CM | POA: Diagnosis not present

## 2023-03-28 DIAGNOSIS — R490 Dysphonia: Secondary | ICD-10-CM | POA: Diagnosis not present

## 2023-03-29 DIAGNOSIS — Z1231 Encounter for screening mammogram for malignant neoplasm of breast: Secondary | ICD-10-CM | POA: Diagnosis not present

## 2023-03-29 DIAGNOSIS — G8929 Other chronic pain: Secondary | ICD-10-CM | POA: Diagnosis not present

## 2023-03-29 DIAGNOSIS — M545 Low back pain, unspecified: Secondary | ICD-10-CM | POA: Diagnosis not present

## 2023-03-29 DIAGNOSIS — M25561 Pain in right knee: Secondary | ICD-10-CM | POA: Diagnosis not present

## 2023-03-29 DIAGNOSIS — R490 Dysphonia: Secondary | ICD-10-CM | POA: Diagnosis not present

## 2023-03-29 DIAGNOSIS — G250 Essential tremor: Secondary | ICD-10-CM | POA: Diagnosis not present

## 2023-03-29 DIAGNOSIS — G5132 Clonic hemifacial spasm, left: Secondary | ICD-10-CM | POA: Diagnosis not present

## 2023-03-30 ENCOUNTER — Encounter: Payer: Self-pay | Admitting: Physical Therapy

## 2023-03-31 DIAGNOSIS — I10 Essential (primary) hypertension: Secondary | ICD-10-CM | POA: Diagnosis not present

## 2023-03-31 DIAGNOSIS — R2681 Unsteadiness on feet: Secondary | ICD-10-CM | POA: Diagnosis not present

## 2023-03-31 DIAGNOSIS — R350 Frequency of micturition: Secondary | ICD-10-CM | POA: Diagnosis not present

## 2023-03-31 DIAGNOSIS — E611 Iron deficiency: Secondary | ICD-10-CM | POA: Diagnosis not present

## 2023-03-31 DIAGNOSIS — Z23 Encounter for immunization: Secondary | ICD-10-CM | POA: Diagnosis not present

## 2023-03-31 DIAGNOSIS — Z7409 Other reduced mobility: Secondary | ICD-10-CM | POA: Diagnosis not present

## 2023-03-31 DIAGNOSIS — T148XXA Other injury of unspecified body region, initial encounter: Secondary | ICD-10-CM | POA: Diagnosis not present

## 2023-03-31 DIAGNOSIS — R296 Repeated falls: Secondary | ICD-10-CM | POA: Diagnosis not present

## 2023-04-01 DIAGNOSIS — G5132 Clonic hemifacial spasm, left: Secondary | ICD-10-CM | POA: Diagnosis not present

## 2023-04-01 DIAGNOSIS — M25561 Pain in right knee: Secondary | ICD-10-CM | POA: Diagnosis not present

## 2023-04-01 DIAGNOSIS — M545 Low back pain, unspecified: Secondary | ICD-10-CM | POA: Diagnosis not present

## 2023-04-01 DIAGNOSIS — R490 Dysphonia: Secondary | ICD-10-CM | POA: Diagnosis not present

## 2023-04-01 DIAGNOSIS — G250 Essential tremor: Secondary | ICD-10-CM | POA: Diagnosis not present

## 2023-04-01 DIAGNOSIS — G8929 Other chronic pain: Secondary | ICD-10-CM | POA: Diagnosis not present

## 2023-04-05 DIAGNOSIS — G5132 Clonic hemifacial spasm, left: Secondary | ICD-10-CM | POA: Diagnosis not present

## 2023-04-05 DIAGNOSIS — R490 Dysphonia: Secondary | ICD-10-CM | POA: Diagnosis not present

## 2023-04-05 DIAGNOSIS — M545 Low back pain, unspecified: Secondary | ICD-10-CM | POA: Diagnosis not present

## 2023-04-05 DIAGNOSIS — G8929 Other chronic pain: Secondary | ICD-10-CM | POA: Diagnosis not present

## 2023-04-05 DIAGNOSIS — M25561 Pain in right knee: Secondary | ICD-10-CM | POA: Diagnosis not present

## 2023-04-05 DIAGNOSIS — G250 Essential tremor: Secondary | ICD-10-CM | POA: Diagnosis not present

## 2023-04-07 DIAGNOSIS — G250 Essential tremor: Secondary | ICD-10-CM | POA: Diagnosis not present

## 2023-04-07 DIAGNOSIS — G5132 Clonic hemifacial spasm, left: Secondary | ICD-10-CM | POA: Diagnosis not present

## 2023-04-07 DIAGNOSIS — M25561 Pain in right knee: Secondary | ICD-10-CM | POA: Diagnosis not present

## 2023-04-07 DIAGNOSIS — R490 Dysphonia: Secondary | ICD-10-CM | POA: Diagnosis not present

## 2023-04-07 DIAGNOSIS — M545 Low back pain, unspecified: Secondary | ICD-10-CM | POA: Diagnosis not present

## 2023-04-07 DIAGNOSIS — G8929 Other chronic pain: Secondary | ICD-10-CM | POA: Diagnosis not present

## 2023-04-11 ENCOUNTER — Encounter: Payer: Self-pay | Admitting: Internal Medicine

## 2023-04-11 ENCOUNTER — Ambulatory Visit: Payer: Medicare Other | Admitting: Diagnostic Neuroimaging

## 2023-04-11 DIAGNOSIS — G5132 Clonic hemifacial spasm, left: Secondary | ICD-10-CM | POA: Diagnosis not present

## 2023-04-11 DIAGNOSIS — M25561 Pain in right knee: Secondary | ICD-10-CM | POA: Diagnosis not present

## 2023-04-11 DIAGNOSIS — G8929 Other chronic pain: Secondary | ICD-10-CM | POA: Diagnosis not present

## 2023-04-11 DIAGNOSIS — G250 Essential tremor: Secondary | ICD-10-CM | POA: Diagnosis not present

## 2023-04-11 DIAGNOSIS — M545 Low back pain, unspecified: Secondary | ICD-10-CM | POA: Diagnosis not present

## 2023-04-11 DIAGNOSIS — R490 Dysphonia: Secondary | ICD-10-CM | POA: Diagnosis not present

## 2023-04-14 ENCOUNTER — Ambulatory Visit: Payer: Medicare Other | Admitting: Diagnostic Neuroimaging

## 2023-04-18 DIAGNOSIS — M25561 Pain in right knee: Secondary | ICD-10-CM | POA: Diagnosis not present

## 2023-04-18 DIAGNOSIS — G5132 Clonic hemifacial spasm, left: Secondary | ICD-10-CM | POA: Diagnosis not present

## 2023-04-18 DIAGNOSIS — G250 Essential tremor: Secondary | ICD-10-CM | POA: Diagnosis not present

## 2023-04-18 DIAGNOSIS — M545 Low back pain, unspecified: Secondary | ICD-10-CM | POA: Diagnosis not present

## 2023-04-18 DIAGNOSIS — G8929 Other chronic pain: Secondary | ICD-10-CM | POA: Diagnosis not present

## 2023-04-18 DIAGNOSIS — R490 Dysphonia: Secondary | ICD-10-CM | POA: Diagnosis not present

## 2023-04-19 DIAGNOSIS — R1310 Dysphagia, unspecified: Secondary | ICD-10-CM | POA: Diagnosis not present

## 2023-04-19 DIAGNOSIS — G5132 Clonic hemifacial spasm, left: Secondary | ICD-10-CM | POA: Diagnosis not present

## 2023-04-19 DIAGNOSIS — I10 Essential (primary) hypertension: Secondary | ICD-10-CM | POA: Diagnosis not present

## 2023-04-19 DIAGNOSIS — M25562 Pain in left knee: Secondary | ICD-10-CM | POA: Diagnosis not present

## 2023-04-19 DIAGNOSIS — E785 Hyperlipidemia, unspecified: Secondary | ICD-10-CM | POA: Diagnosis not present

## 2023-04-19 DIAGNOSIS — E039 Hypothyroidism, unspecified: Secondary | ICD-10-CM | POA: Diagnosis not present

## 2023-04-19 DIAGNOSIS — M5416 Radiculopathy, lumbar region: Secondary | ICD-10-CM | POA: Diagnosis not present

## 2023-04-19 DIAGNOSIS — G8929 Other chronic pain: Secondary | ICD-10-CM | POA: Diagnosis not present

## 2023-04-19 DIAGNOSIS — M4722 Other spondylosis with radiculopathy, cervical region: Secondary | ICD-10-CM | POA: Diagnosis not present

## 2023-04-19 DIAGNOSIS — Z96651 Presence of right artificial knee joint: Secondary | ICD-10-CM | POA: Diagnosis not present

## 2023-04-19 DIAGNOSIS — F32A Depression, unspecified: Secondary | ICD-10-CM | POA: Diagnosis not present

## 2023-04-19 DIAGNOSIS — R490 Dysphonia: Secondary | ICD-10-CM | POA: Diagnosis not present

## 2023-04-19 DIAGNOSIS — I471 Supraventricular tachycardia, unspecified: Secondary | ICD-10-CM | POA: Diagnosis not present

## 2023-04-19 DIAGNOSIS — M1990 Unspecified osteoarthritis, unspecified site: Secondary | ICD-10-CM | POA: Diagnosis not present

## 2023-04-19 DIAGNOSIS — Z853 Personal history of malignant neoplasm of breast: Secondary | ICD-10-CM | POA: Diagnosis not present

## 2023-04-19 DIAGNOSIS — M4712 Other spondylosis with myelopathy, cervical region: Secondary | ICD-10-CM | POA: Diagnosis not present

## 2023-04-19 DIAGNOSIS — M545 Low back pain, unspecified: Secondary | ICD-10-CM | POA: Diagnosis not present

## 2023-04-19 DIAGNOSIS — Z905 Acquired absence of kidney: Secondary | ICD-10-CM | POA: Diagnosis not present

## 2023-04-19 DIAGNOSIS — G250 Essential tremor: Secondary | ICD-10-CM | POA: Diagnosis not present

## 2023-04-19 DIAGNOSIS — M25561 Pain in right knee: Secondary | ICD-10-CM | POA: Diagnosis not present

## 2023-04-19 DIAGNOSIS — J45909 Unspecified asthma, uncomplicated: Secondary | ICD-10-CM | POA: Diagnosis not present

## 2023-04-19 DIAGNOSIS — Z9181 History of falling: Secondary | ICD-10-CM | POA: Diagnosis not present

## 2023-04-22 DIAGNOSIS — G5132 Clonic hemifacial spasm, left: Secondary | ICD-10-CM | POA: Diagnosis not present

## 2023-04-22 DIAGNOSIS — R490 Dysphonia: Secondary | ICD-10-CM | POA: Diagnosis not present

## 2023-04-22 DIAGNOSIS — M545 Low back pain, unspecified: Secondary | ICD-10-CM | POA: Diagnosis not present

## 2023-04-22 DIAGNOSIS — M25561 Pain in right knee: Secondary | ICD-10-CM | POA: Diagnosis not present

## 2023-04-22 DIAGNOSIS — G250 Essential tremor: Secondary | ICD-10-CM | POA: Diagnosis not present

## 2023-04-22 DIAGNOSIS — G8929 Other chronic pain: Secondary | ICD-10-CM | POA: Diagnosis not present

## 2023-04-25 DIAGNOSIS — G250 Essential tremor: Secondary | ICD-10-CM | POA: Diagnosis not present

## 2023-04-25 DIAGNOSIS — G8929 Other chronic pain: Secondary | ICD-10-CM | POA: Diagnosis not present

## 2023-04-25 DIAGNOSIS — R490 Dysphonia: Secondary | ICD-10-CM | POA: Diagnosis not present

## 2023-04-25 DIAGNOSIS — G5132 Clonic hemifacial spasm, left: Secondary | ICD-10-CM | POA: Diagnosis not present

## 2023-04-25 DIAGNOSIS — M25561 Pain in right knee: Secondary | ICD-10-CM | POA: Diagnosis not present

## 2023-04-25 DIAGNOSIS — M545 Low back pain, unspecified: Secondary | ICD-10-CM | POA: Diagnosis not present

## 2023-04-28 ENCOUNTER — Encounter: Payer: Self-pay | Admitting: Sports Medicine

## 2023-04-28 ENCOUNTER — Ambulatory Visit: Payer: Medicare Other | Admitting: Sports Medicine

## 2023-04-28 ENCOUNTER — Other Ambulatory Visit: Payer: Self-pay | Admitting: Allergy

## 2023-04-28 DIAGNOSIS — M170 Bilateral primary osteoarthritis of knee: Secondary | ICD-10-CM

## 2023-04-28 DIAGNOSIS — M25561 Pain in right knee: Secondary | ICD-10-CM | POA: Diagnosis not present

## 2023-04-28 DIAGNOSIS — G8929 Other chronic pain: Secondary | ICD-10-CM

## 2023-04-28 DIAGNOSIS — M25562 Pain in left knee: Secondary | ICD-10-CM

## 2023-04-28 MED ORDER — BUPIVACAINE HCL 0.25 % IJ SOLN
2.0000 mL | INTRAMUSCULAR | Status: AC | PRN
  Administered 2023-04-28: 2 mL via INTRA_ARTICULAR

## 2023-04-28 MED ORDER — METHYLPREDNISOLONE ACETATE 40 MG/ML IJ SUSP
40.0000 mg | INTRAMUSCULAR | Status: AC | PRN
Start: 2023-04-28 — End: 2023-04-28
  Administered 2023-04-28: 40 mg via INTRA_ARTICULAR

## 2023-04-28 MED ORDER — METHYLPREDNISOLONE ACETATE 40 MG/ML IJ SUSP
40.0000 mg | INTRAMUSCULAR | Status: AC | PRN
  Administered 2023-04-28: 40 mg via INTRA_ARTICULAR

## 2023-04-28 MED ORDER — LIDOCAINE HCL 1 % IJ SOLN
2.0000 mL | INTRAMUSCULAR | Status: AC | PRN
  Administered 2023-04-28: 2 mL

## 2023-04-28 NOTE — Progress Notes (Signed)
 April Church - 83 y.o. female MRN [id number]  Date of birth: 1940-08-30  Office Visit Note: Visit Date: 04/28/2023 PCP: Lorenda Ishihara, MD Referred by: Lorenda Ishihara,*  Subjective: Chief Complaint  Patient presents with   Right Knee - Follow-up   Left Knee - Follow-up   HPI: April Church is a pleasant 83 y.o. female who presents today for follow-up bilateral knee pain with advanced OA.  Ryin is having pain in both of her knees, the right is worse than left.  Does report some intermittent swelling but nothing significant.  Few sessions of physical therapy at her house but is interested in going to formalized physical therapy as she has found this helpful to try to keep her more mobile. On 01/12/23 we did proceed with bilateral knee CSI with hyaluronic acid injections which were quite helpful for her.  She does feel the gel injections help prolong her duration of relief.  He does feel like she is no longer experiencing the benefit from the injections back in November currently however.  He is also inquiring about a handicap placard.  Pertinent ROS were reviewed with the patient and found to be negative unless otherwise specified above in HPI.   Assessment & Plan: Visit Diagnoses:  1. Bilateral primary osteoarthritis of knee   2. Chronic pain of both knees    Plan: Impression is exacerbation of chronic bilateral knee osteoarthritis which is bone-on-bone on the right knee and advanced of the left knee.  She has been receiving temporary relief from corticosteroid injections and did get longer lasting relief from the combine this with viscosupplementation.  She is interested in repeating this again but her last viscosupplementation was in November.  Through shared decision making, we did proceed with bilateral corticosteroid injection into each knee, tolerated well.  We will send approval off for repeat viscosupplementation and we will follow-up in May  for repeat evaluation of the knees and plan to proceed with viscosupplementation if she desires for each knee.  She has done some home therapy and is interested in formalized physical therapy which I think would be helpful to help try to maintain range of motion and quadricep/knee strengthening to help offload the knee arthritis.  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  She does have tramadol 50 mg through her pain management doctor, Dr. Berline Chough, which she will take for breakthrough pain only.  Okay with continuing Tylenol twice daily as needed and topical Voltaren gel as needed.  We did have a possible discussion on knee replacement, she is hoping to avoid this but if for some reason she is no longer receiving relief that is lasting from her injections we may have her see one of our knee surgeons for discussion on this.  This patient is diagnosed with osteoarthritis of the bilateral knees.  Radiographs show evidence of joint space narrowing, osteophytes, subchondral sclerosis and/or subchondral cysts.  This patient has knee pain which interferes with functional and activities of daily living.    This patient has experienced inadequate response, adverse effects and/or intolerance with conservative treatments such as acetaminophen, NSAIDS, topical creams, physical therapy or regular exercise, knee bracing and/or weight loss.   This patient has experienced inadequate response or has a contraindication to intra articular steroid injections for at least 3 months.   This patient is not scheduled to have a total knee replacement within 6 months of starting treatment with viscosupplementation.   Follow-up: Return in about  3 months (around 07/20/2023) for f/u about 2.5 months for bilateral knee (30-min for poss procedure).   Meds & Orders: No orders of the defined types were placed in this encounter.   Orders Placed This Encounter  Procedures   Ambulatory referral to  Physical Therapy     Procedures: Large Joint Inj: R knee on 04/28/2023 2:14 PM Indications: pain Details: 22 G 1.5 in needle, anterolateral approach Medications: 2 mL lidocaine 1 %; 2 mL bupivacaine 0.25 %; 40 mg methylPREDNISolone acetate 40 MG/ML Outcome: tolerated well, no immediate complications  Knee Injection, Right: After discussion on risks/benefits/indications, informed verbal consent was obtained and a timeout was performed, patient was seated on exam table. The patient's knee was prepped with Betadine and alcohol swab and utilizing anterolateral approach, the patient's knee was injected intraarticularly with 2:2:1 lidocaine 1%:bupivicaine 0.25%:depomedrol. Patient tolerated the procedure well without immediate complications.  Procedure, treatment alternatives, risks and benefits explained, specific risks discussed. Consent was given by the patient. Patient was prepped and draped in the usual sterile fashion.    Large Joint Inj: L knee on 04/28/2023 2:14 PM Indications: pain Details: 22 G 1.5 in needle, anterolateral approach Medications: 2 mL lidocaine 1 %; 2 mL bupivacaine 0.25 %; 40 mg methylPREDNISolone acetate 40 MG/ML Outcome: tolerated well, no immediate complications  Knee Injection, Left: After discussion on risks/benefits/indications, informed verbal consent was obtained and a timeout was performed, patient was seated on exam table. The patient's knee was prepped with Betadine and alcohol swab and utilizing anterolateral  approach, the patient's knee was injected intraarticularly with 2:2:1 lidocaine 1%:bupivicaine 0.25%:depomedrol. Patient tolerated the procedure well without immediate complications.  Procedure, treatment alternatives, risks and benefits explained, specific risks discussed. Consent was given by the patient. Patient was prepped and draped in the usual sterile fashion.          Clinical History: No specialty comments available.  She reports that she  has never smoked. She has never used smokeless tobacco. No results for input(s): "HGBA1C", "LABURIC" in the last 8760 hours.  Objective:    Physical Exam  Gen: Well-appearing, in no acute distress; non-toxic CV: Well-perfused. Warm.  Resp: Breathing unlabored on room air; no wheezing. Psych: Fluid speech in conversation; appropriate affect; normal thought process  Ortho Exam - Bilateral knees: There is a trace effusion on the right knee without warmth or redness, left knee without effusion.  There is limited range of motion with the right knee from about 10-95 degrees compared to the left knee with 5-110 degrees of range of motion.  There is bilateral patellar crepitus noted.  There is some pseudo instability more so with knee valgus but no significant laxity or giveaway.  Patient does walk with a slow gait with the assistance of a cane.  Imaging:  12/23/2022: 4 views of bilateral knees including AP standing, Rosenberg, lateral and  sunrise view were ordered and reviewed by myself today.  X-rays show  complete bone-on-bone arthritic change of the medial tibiofemoral joint  space as well as advanced tricompartmental change.  There is notable bony  sclerosis over the medial femoral condyle and to a lesser degree the  medial tibial plateau of bilateral knees.  No acute fracture noted.  There  is varus formation of both knees.   Past Medical/Family/Surgical/Social History: Medications & Allergies reviewed per EMR, new medications updated. Patient Active Problem List   Diagnosis Date Noted   Rectal bleeding 07/27/2022   Hemorrhoids 07/27/2022   Chronic pain of both knees 06/21/2022  Moderate persistent asthma without complication 03/27/2021   PSVT (paroxysmal supraventricular tachycardia) (HCC) 11/26/2020   Syncope and collapse 11/24/2020   Primary osteoarthritis of right knee 05/12/2020   Primary osteoarthritis of left knee 05/12/2020   Hyperlipidemia    Bilateral calf pain  03/05/2019   Genetic testing 02/13/2019   Family history of ovarian cancer    Family history of bladder cancer    Family history of colon cancer    Family history of leukemia    Lumbar radiculopathy 12/08/2018   Myofascial pain 12/08/2018   Impaired gait and mobility 12/08/2018   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   Chronic nonallergic rhinitis 12/29/2016   Mild persistent asthma, uncomplicated 12/29/2016   Vocal fold paralysis, 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 2011- no further tx. now- Dr. Donnie Coffin , Dr. Dayton Scrape   Cyst of finger 11/2011   annular cyst right long finger   Dental crowns present    Dermatitis    Frequency of urination    GERD (gastroesophageal reflux disease)    Hemorrhoid    History of colon polyps 2009   Also noted 2012 and 2017 by colonoscopy.   History of shingles 1971   Had right ischial recurrence in the March 2019   Hyperlipidemia    On atorvastatin   Hypertension    under control, has been on med. x 2 yrs.   Liposarcoma of left shoulder (HCC) 1969   Parkinson's disease (HCC)    With mild tremor (neurologist Dr. Marjory Lies), neurosurgeon Dr. Lovell Sheehan   PONV (postoperative nausea and vomiting)    Seasonal allergies    Shingles of eyelid 1971   Right eyelid; recurrent -> last episode 05/11/2017   Tremors of nervous system    hands-essential tremor; associated with Parkinson's.   Trigger finger of right hand 11/2011   long finger   Family History  Problem Relation Age of  Onset   Kidney disease Mother    Stroke Father    Stroke Sister        08/2015   Diabetes Brother    Heart disease Brother    Ovarian cancer Sister 46   Heart disease Paternal Grandmother    Heart disease Paternal Grandfather    Leukemia Other 15       brother's grandson   Bladder Cancer Brother 13   Colon cancer Niece        dx in late 23s   Past Surgical History:  Procedure Laterality Date   14-day Zio Patch Monitor  08/2020   (report to be scanned): Predominant SR w/ HR 61 to 142 bpm and average 86 bpm.  Total of 59 episodes of PAT/PSVT  4 to 15 beats (not noted on diary).  Fastest was 5 beats at a rate of 193 bpm, longest was 15 beats at a rate of 106 bpm.  Rare isolated PACs (as well as couplets and triplets) with rare isolated PVCs.  No sustained arrhythmias or bradycardia to explain syncope.   ANTERIOR CERVICAL DECOMPRESSION/DISCECTOMY FUSION 4 LEVELS N/A 11/14/2013  Procedure: ANTERIOR CERVICAL DECOMPRESSION/DISCECTOMY FUSION 4 LEVELS;  Surgeon: Tressie Stalker, MD;  Location: [facility];  Service: Neurosurgery;  Laterality: N/A;  C34 C45 C56 C67 anterior cervical fusion with interbody prosthesis plating and  bonegraft   APPENDECTOMY  age 7   BREAST LUMPECTOMY  06/16/2009   left; SLN bx.   BREAST LUMPECTOMY  07/01/2009   re-excision   BREAST SURGERY  02/22/1997   reduction   CATARACT EXTRACTION, BILATERAL     COLONOSCOPY WITH PROPOFOL N/A 12/03/2014   Procedure: COLONOSCOPY WITH PROPOFOL;  Surgeon: Charolett Bumpers, MD;  Location: [facility];  Service: Endoscopy;  Laterality: N/A;   FOOT SURGERY  06/22/2011   left   KNEE ARTHROSCOPY  03/12/2005   right   KNEE ARTHROSCOPY     left   Lower Extremity Venous Dopplers  12/12/2020   No DVT bilaterally in the deep veins or superficial veins.  No deep or superficial venous reflux noted bilaterally with exception of Right SSV at the knee.   NASAL SINUS SURGERY     x 2   NEPHRECTOMY LIVING DONOR Left 04/22/2008   donated  to spouse 2010([facility])   TRANSTHORACIC ECHOCARDIOGRAM  12/10/2020   EF 60 to 65%.  Normal LV size and function.  No heart WMA.  GR 1 DD.  Normal RV, RVP and RAP.[name]  Normal valves.   TRIGGER FINGER RELEASE  12/16/2011   Procedure: RELEASE TRIGGER FINGER/A-1 PULLEY;  Surgeon: Tami Ribas, MD;  Location: [facility];  Service: Orthopedics;  Laterality: Right;  RIGHT LONG FINGER TRIGGER RELEASE & ANNULAR CYST EXCISION   TUMOR EXCISION  age 36   right arm   Social History   Occupational History   Occupation: Retired   Occupation: Housewife  Tobacco Use   Smoking status: Never   Smokeless tobacco: Never  Vaping Use   Vaping status: Never Used  Substance and Sexual Activity   Alcohol use: No    Alcohol/week: 0.0 standard drinks of alcohol   Drug use: No   Sexual activity: Not Currently

## 2023-04-28 NOTE — Progress Notes (Signed)
 Patient says that she got a few weeks of good relief from her last injections but that her pain has returned. Her pain is still the medial aspect of both knees. She says that she has had physical therapy at her house and has about 2 weeks left of that. She does feel that she would be able to go to physical therapy somewhere else if she can get that extended; she and her daughter both believe that it keeps her more mobile. She is also inquiring about a handicap placard.

## 2023-05-04 ENCOUNTER — Ambulatory Visit (INDEPENDENT_AMBULATORY_CARE_PROVIDER_SITE_OTHER): Payer: Medicare Other | Admitting: Allergy

## 2023-05-04 ENCOUNTER — Other Ambulatory Visit: Payer: Self-pay

## 2023-05-04 ENCOUNTER — Encounter: Payer: Self-pay | Admitting: Allergy

## 2023-05-04 VITALS — BP 134/88 | HR 72 | Temp 98.6°F | Resp 16 | Ht <= 58 in | Wt 119.6 lb

## 2023-05-04 DIAGNOSIS — T781XXD Other adverse food reactions, not elsewhere classified, subsequent encounter: Secondary | ICD-10-CM | POA: Diagnosis not present

## 2023-05-04 DIAGNOSIS — K219 Gastro-esophageal reflux disease without esophagitis: Secondary | ICD-10-CM

## 2023-05-04 DIAGNOSIS — J4540 Moderate persistent asthma, uncomplicated: Secondary | ICD-10-CM

## 2023-05-04 DIAGNOSIS — J310 Chronic rhinitis: Secondary | ICD-10-CM | POA: Diagnosis not present

## 2023-05-04 DIAGNOSIS — J3802 Paralysis of vocal cords and larynx, bilateral: Secondary | ICD-10-CM | POA: Diagnosis not present

## 2023-05-04 MED ORDER — LEVOCETIRIZINE DIHYDROCHLORIDE 5 MG PO TABS: ORAL_TABLET | ORAL | 5 refills | Status: AC

## 2023-05-04 MED ORDER — BUDESONIDE-FORMOTEROL FUMARATE 160-4.5 MCG/ACT IN AERO: 2.0000 | INHALATION_SPRAY | RESPIRATORY_TRACT | 5 refills | Status: AC | PRN

## 2023-05-04 MED ORDER — IPRATROPIUM BROMIDE 0.06 % NA SOLN: 2.0000 | Freq: Two times a day (BID) | NASAL | 5 refills | Status: AC

## 2023-05-04 MED ORDER — FAMOTIDINE 20 MG PO TABS: 20.0000 mg | ORAL_TABLET | Freq: Every day | ORAL | 5 refills | Status: DC

## 2023-05-04 MED ORDER — ALBUTEROL SULFATE HFA 108 (90 BASE) MCG/ACT IN AERS
1.0000 | INHALATION_SPRAY | Freq: Four times a day (QID) | RESPIRATORY_TRACT | 1 refills | Status: AC | PRN
Start: 2023-05-04 — End: ?

## 2023-05-04 NOTE — Patient Instructions (Signed)
 Asthma Under good control at this time Continue Symbicort 160/4.5 mcg  2 puffs twice a day with spacer to help prevent cough and wheeze.  Continue albuterol 2 puff into the lungs every 4-6 hours as needed for shortness of breath or wheezing Have access to albuterol inhaler 2 puffs every 4-6 hours as needed for cough/wheeze/shortness of breath/chest tightness.  May use 15-20 minutes prior to activity.   Monitor frequency of use.    Chronic nonallergic rhinitis Change Allegra to Xyzal 5mg  once you run out of your current Allegra supply.  Rotate between Allegra and Xyzal every 3-6 months to maintain efficacy.  Increase to Atrovent 0.06% use 2 sprays in each nostril twice a day for a runny nose control. With using nasal sprays point tip of bottle toward eye on same side nostril and lean head slightly forward for best technique.   Consider saline nasal rinses as needed for nasal symptoms. Use this before any medicated nasal sprays for best result  Vocal fold paralysis, bilateral Continue routine follow-up with Dr. Delford Field with ENT as needed  GERD Continue famotidine 20 mg once a day as you have been Continue dietary and lifestyle modifications  Food allergy Continue avoidance of shellfish and wine  In case of an allergic reaction, take Benadryl 50 mg every 4 hours, and if life-threatening symptoms occur, inject with EpiPen 0.3 mg.  Follow up in 6 months or sooner if needed

## 2023-05-04 NOTE — Progress Notes (Signed)
 Follow-up Note  RE: April Church MRN: [id number] DOB: [birth year] Date of Office Visit: 05/04/2023   History of present illness: April Church is a 84 y.o. female presenting today for follow-up of asthma, non allergic rhinitis, GERD, food allergy with history of vocal cord paralysis.  She was last seen in the office on 11/03/22 by myself.  Discussed the use of AI scribe software for clinical note transcription with the patient, who gave verbal consent to proceed.  Her breathing has been doing fine since last visit.  She denies any major health changes, surgeries, or hospitalizations since September. She uses her Symbicort inhaler twice daily and has not needed her albuterol rescue inhaler. There have been no visits to her primary care office, urgent care, or emergency department for breathing issues since September.  She continues to experience nasal drip and uses Atrovent nasal spray (ipratropium) every morning, although she is unsure of its effectiveness. She rotates between Allegra and Xyzal for antihistamine management, currently using Allegra, which she purchases from the pharmacy.  She avoids shellfish and wine products and has not needed to use her EpiPen since September, although she carries it with her.   She no longer receives Botox injections due to voice changes and difficulty speaking post-injection. Her voice has been raspy lately, which she finds challenging but she does not want to have on voice for a month post-botox injection as she lives alone now.  No recent issues with reflux.     Review of systems: 10pt ROS negative unless noted above in HPI   Past medical/social/surgical/family history have been reviewed and are unchanged unless specifically indicated below.  No changes  Medication List: Current Outpatient Medications  Medication Sig Dispense Refill   albuterol (VENTOLIN HFA) 108 (90 Base) MCG/ACT inhaler Inhale 1-2 puffs into the lungs every 6  (six) hours as needed for wheezing or shortness of breath. 18 g 1   amLODipine (NORVASC) 2.5 MG tablet Take one tablet each day. 90 tablet 3   atorvastatin (LIPITOR) 20 MG tablet Take 20 mg by mouth daily.     budesonide-formoterol (SYMBICORT) 160-4.5 MCG/ACT inhaler Inhale 2 puffs into the lungs as needed. 1 each 5   CALCIUM CARBONATE PO Take 1 tablet by mouth 2 (two) times daily.     Cholecalciferol (VITAMIN D) 1000 UNITS capsule Take 1,000 Units by mouth 2 (two) times daily.     cyclobenzaprine (FLEXERIL) 5 MG tablet TAKE 1 TO UP TO 2 TABLETS BY MOUTH ONCE DAILY AT BEDTIME AS NEEDED FOR MUSCLE SPASM 50 tablet 11   EPINEPHrine 0.3 mg/0.3 mL IJ SOAJ injection Inject 0.3 mg into the muscle as needed. 2 each 1   famotidine (PEPCID) 20 MG tablet Take 1 tablet (20 mg total) by mouth daily. 30 tablet 5   fluticasone (FLOVENT HFA) 110 MCG/ACT inhaler 1 puff     hydrocortisone (ANUSOL-HC) 2.5 % rectal cream Place 1 Application rectally 2 (two) times daily. 30 g 1   ipratropium (ATROVENT) 0.03 % nasal spray USE 2 SPRAY(S) IN EACH NOSTRIL TWICE DAILY 30 mL 5   ketotifen (ZADITOR) 0.025 % ophthalmic solution Apply 1 drop to eye daily.     levocetirizine (XYZAL) 5 MG tablet TAKE 1 TABLET BY MOUTH ONCE DAILY IN THE EVENING . APPOINTMENT REQUIRED FOR FUTURE REFILLS 30 tablet 0   metoprolol succinate (TOPROL-XL) 25 MG 24 hr tablet Take 12.5 mg by mouth daily. Take 1/2 Tablet In The Morning     Na Sulfate-K  Sulfate-Mg Sulf 17.5-3.13-1.6 GM/177ML SOLN Use as directed; may use generic; goodrx card if insurance will not cover generic 9912 each 0   nystatin ointment (MYCOSTATIN) Apply 1 Application topically 2 (two) times daily.     Polyethyl Glycol-Propyl Glycol (SYSTANE OP) Place 1 drop into both eyes 2 (two) times daily.     primidone (MYSOLINE) 50 MG tablet 3 in the AM, 3 at noon, 2 at night 720 tablet 1   traMADol (ULTRAM) 50 MG tablet TAKE 1 TABLET BY MOUTH EVERY 12 HOURS AS NEEDED 20 tablet 0    triamcinolone ointment (KENALOG) 0.1 % Apply 1 Application topically as needed.     venlafaxine (EFFEXOR) 75 MG tablet Take 1 tablet (75 mg total) by mouth daily. 90 tablet 1   No current facility-administered medications for this visit.     Known medication allergies: Allergies  Allergen Reactions   Shellfish Allergy Shortness Of Breath   Duloxetine Itching and Rash   Sulfa Drugs Cross Reactors Anxiety and Rash   Lisinopril     Other reaction(s): cough Other reaction(s): cough Other reaction(s): cough   Lyrica [Pregabalin] Other (See Comments)    "made me drunk, couldn't function"   Other     Other reaction(s): hyper   Sulfa Antibiotics Other (See Comments)    Other reaction(s): hyper, break out in a rash Other reaction(s): hyper, break out in a rash   Codeine Anxiety    Other reaction(s): hyper Other reaction(s): hyper     Physical examination: Blood pressure 134/88, pulse 72, temperature 98.6 F (37 C), temperature source Temporal, resp. rate 16, height 4' 8.5" (1.435 m), weight 119 lb 9.6 oz (54.3 kg), SpO2 97%.  General: Alert, interactive, in no acute distress. HEENT: PERRLA, TMs pearly gray, turbinates non-edematous with clear discharge, post-pharynx non erythematous. Neck: Supple without lymphadenopathy. Lungs: Clear to auscultation without wheezing, rhonchi or rales. {no increased work of breathing. CV: Normal S1, S2 without murmurs. Abdomen: Nondistended, nontender. Skin: Warm and dry, without lesions or rashes. Extremities:  No clubbing, cyanosis or edema. Neuro:   Grossly intact.  Diagnositics/Labs:  Spirometry: FEV1: 0.98L 61%, FVC: 1.57L 74% predicted.  FEV1 is reduced for age/demogrpahic  Assessment and plan:   Asthma Under good control at this time Continue Symbicort 160/4.5 mcg  2 puffs twice a day with spacer to help prevent cough and wheeze.  Continue albuterol 2 puff into the lungs every 4-6 hours as needed for shortness of breath or  wheezing Have access to albuterol inhaler 2 puffs every 4-6 hours as needed for cough/wheeze/shortness of breath/chest tightness.  May use 15-20 minutes prior to activity.   Monitor frequency of use.    Chronic nonallergic rhinitis Change Allegra to Xyzal 5mg  once you run out of your current Allegra supply.  Rotate between Allegra and Xyzal every 3-6 months to maintain efficacy.  Increase to Atrovent 0.06% use 2 sprays in each nostril twice a day for a runny nose control. With using nasal sprays point tip of bottle toward eye on same side nostril and lean head slightly forward for best technique.   Consider saline nasal rinses as needed for nasal symptoms. Use this before any medicated nasal sprays for best result  Vocal fold paralysis, bilateral Continue routine follow-up with Dr. Delford Field with ENT as needed  GERD Continue famotidine 20 mg once a day as you have been Continue dietary and lifestyle modifications  Food allergy Continue avoidance of shellfish and wine  In case of an allergic reaction, take  Benadryl 50 mg every 4 hours, and if life-threatening symptoms occur, inject with EpiPen 0.3 mg.  Follow up in 6 months or sooner if needed   I appreciate the opportunity to take part in Kashmir's care. Please do not hesitate to contact me with questions.  Sincerely,   Margo Aye, MD Allergy/Immunology [facility]

## 2023-05-05 ENCOUNTER — Telehealth: Payer: Self-pay | Admitting: Neurology

## 2023-05-05 NOTE — Telephone Encounter (Signed)
 April Church with PT called to let Dr.Tat know the patient had to leave suddenly today to go out of town. They moved her PT from this week to next week.

## 2023-05-09 ENCOUNTER — Encounter: Payer: Self-pay | Admitting: Physical Therapy

## 2023-05-09 ENCOUNTER — Ambulatory Visit (INDEPENDENT_AMBULATORY_CARE_PROVIDER_SITE_OTHER): Admitting: Physical Therapy

## 2023-05-09 DIAGNOSIS — R2689 Other abnormalities of gait and mobility: Secondary | ICD-10-CM | POA: Diagnosis not present

## 2023-05-09 DIAGNOSIS — R2681 Unsteadiness on feet: Secondary | ICD-10-CM | POA: Diagnosis not present

## 2023-05-09 DIAGNOSIS — R293 Abnormal posture: Secondary | ICD-10-CM

## 2023-05-09 DIAGNOSIS — M25561 Pain in right knee: Secondary | ICD-10-CM | POA: Diagnosis not present

## 2023-05-09 DIAGNOSIS — G8929 Other chronic pain: Secondary | ICD-10-CM

## 2023-05-09 DIAGNOSIS — M25562 Pain in left knee: Secondary | ICD-10-CM

## 2023-05-09 DIAGNOSIS — Z9181 History of falling: Secondary | ICD-10-CM

## 2023-05-09 DIAGNOSIS — M6281 Muscle weakness (generalized): Secondary | ICD-10-CM

## 2023-05-09 DIAGNOSIS — M6249 Contracture of muscle, multiple sites: Secondary | ICD-10-CM

## 2023-05-09 NOTE — Therapy (Signed)
 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 2011- no further tx. now- Dr. Donnie Coffin , Dr. Dayton Scrape   Cyst of finger 11/2011   annular cyst right long finger   Dental crowns present    Dermatitis    Frequency of urination    GERD (gastroesophageal reflux disease)    Hemorrhoid    History of colon polyps 2009   Also noted 2012 and 2017 by colonoscopy.   History of shingles 1971   Had right ischial recurrence in the March 2019   Hyperlipidemia    On atorvastatin   Hypertension    under control, has been on med. x 2 yrs.   Liposarcoma of left shoulder (HCC) 1969   Parkinson's disease (HCC)    With mild tremor (neurologist Dr. Marjory Lies), neurosurgeon Dr. Lovell Sheehan   PONV (postoperative nausea and vomiting)    Seasonal allergies    Shingles of eyelid 1971   Right eyelid; recurrent -> last episode 05/11/2017   Tremors of nervous system    hands-essential tremor; associated with Parkinson's.   Trigger finger of right hand 11/2011   long finger   Past Surgical History:  Procedure Laterality Date   14-day Zio Patch Monitor  08/2020   (report to be scanned): Predominant SR w/ HR 61 to 142 bpm and average 86 bpm.  Total of 59 episodes of PAT/PSVT  4 to 15 beats (not noted on diary).  Fastest was 5 beats at a rate of 193 bpm, longest was  15 beats at a rate of 106 bpm.  Rare isolated PACs (as well as couplets and triplets) with rare isolated PVCs.  No sustained arrhythmias or bradycardia to explain syncope.   ANTERIOR CERVICAL DECOMPRESSION/DISCECTOMY FUSION 4 LEVELS N/A 11/14/2013   Procedure: ANTERIOR CERVICAL DECOMPRESSION/DISCECTOMY FUSION 4 LEVELS;  Surgeon: Tressie Stalker, MD;  Location: [facility];  Service: Neurosurgery;  Laterality: N/A;  C34 C45 C56 C67 anterior cervical fusion with interbody prosthesis plating and  bonegraft   APPENDECTOMY  age 74   BREAST LUMPECTOMY  06/16/2009   left; SLN bx.   BREAST LUMPECTOMY  07/01/2009   re-excision   BREAST SURGERY  02/22/1997   reduction   CATARACT EXTRACTION, BILATERAL     COLONOSCOPY WITH PROPOFOL N/A 12/03/2014   Procedure: COLONOSCOPY WITH PROPOFOL;  Surgeon: Charolett Bumpers, MD;  Location: [facility];  Service: Endoscopy;  Laterality: N/A;   FOOT SURGERY  06/22/2011   left   KNEE ARTHROSCOPY  03/12/2005   right   KNEE ARTHROSCOPY     left   Lower Extremity Venous Dopplers  12/12/2020   No DVT bilaterally in the deep veins or superficial veins.  No deep or superficial venous reflux noted bilaterally with exception of Right SSV at the knee.   NASAL SINUS SURGERY     x 2   NEPHRECTOMY LIVING DONOR Left 04/22/2008   donated to spouse 2010([facility])   TRANSTHORACIC ECHOCARDIOGRAM  12/10/2020   EF 60 to 65%.  Normal LV size and function.  No heart WMA.  GR 1 DD.  Normal RV, RVP and RAP.[name]  Normal valves.   TRIGGER FINGER RELEASE  12/16/2011   Procedure: RELEASE TRIGGER FINGER/A-1 PULLEY;  Surgeon: Tami Ribas, MD;  Location: [facility];  Service: Orthopedics;  Laterality: Right;  RIGHT LONG FINGER TRIGGER RELEASE & ANNULAR CYST EXCISION   TUMOR EXCISION  age 11   right arm   Patient Active Problem List   Diagnosis Date Noted   Rectal bleeding 07/27/2022   Hemorrhoids 07/27/2022   Chronic pain of both knees 06/21/2022   Moderate persistent  asthma without complication 03/27/2021   PSVT (paroxysmal supraventricular tachycardia) (HCC) 11/26/2020   Syncope and collapse 11/24/2020   Primary osteoarthritis of right knee 05/12/2020   Primary osteoarthritis of left knee 05/12/2020   Hyperlipidemia    Bilateral calf pain 03/05/2019   Genetic testing 02/13/2019   Family history of ovarian cancer    Family history of bladder cancer    Family history of colon cancer    Family history of leukemia    Lumbar radiculopathy 12/08/2018   Myofascial pain 12/08/2018   Impaired gait and mobility 12/08/2018   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   Chronic nonallergic rhinitis 12/29/2016   Mild persistent asthma, uncomplicated 12/29/2016   Vocal fold paralysis, 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   Gastroesophageal reflux disease 12/29/2016   Cervical spondylosis with myelopathy and radiculopathy 11/14/2013   Essential tremor 06/13/2013   Cervical spondylosis without myelopathy 06/13/2013   Breast cancer of lower-outer quadrant of left female breast (HCC) 09/10/2010    PCP: Lorenda Ishihara, MD  REFERRING PROVIDER: Madelyn Brunner, MD  REFERRING DIAG: M17.0 (ICD-10-CM) - Bilateral primary osteoarthritis of knee   THERAPY DIAG:  Unsteadiness on feet  Abnormal posture  Chronic pain of both knees  Other abnormalities of gait and mobility  History of falling  Muscle weakness (generalized)  Contracture of muscle, multiple sites  Rationale for Evaluation and Treatment: Rehabilitation  ONSET DATE: 04/28/2023 MD referral to PT  SUBJECTIVE:   SUBJECTIVE STATEMENT: Patient was seen by Dr. Shon Baton with bilateral knee pain, RLE>LLE. Radiographs show evidence of joint space narrowing, osteophytes, subchondral sclerosis and/or subchondral cysts.  She had injections to knee previously that only help a little while.  She wants to learn exercises to control knee pain and stay functional.  She has had multiple falls recently including in her garage with  large hematoma on left forehead present during evaluation. Patient denied any headaches or visual changes.   PERTINENT HISTORY: Cervical Decompression surgery 2015, Supraventicular tachycardia, syncope, OA, breast CA, Donated right kidney to her husband, HTN, Parkinson's Disease with slight tremor,   PAIN:  NPRS scale: 0/10,  in last week 10/10 Pain location: knees RLE>LLE more medially Pain description: bad ache Aggravating factors: walking,  standing more than few minutes Relieving factors: injections,  tylenol takes edge off,  sleeping with pillow bw knees.   PRECAUTIONS: Fall  WEIGHT BEARING RESTRICTIONS: No  FALLS:  Has patient fallen in last 6 months? Yes. Number of falls 3 knot on head,   LIVING ENVIRONMENT: Lives with: lives alone Lives in: Townhouse 2-story with master bedroom & bathroom downstairs,  Stairs: Yes: Internal: 14-15 steps; on right going up and on left going up and External: 4 steps; can reach both Has following equipment at home: Single point cane, Walker - 4 wheeled, shower chair, and Grab bars  OCCUPATION: retired  PLOF: Independent  PATIENT GOALS:  learn exercises to function better and with less pain.   Next MD visit:  OBJECTIVE:  DIAGNOSTIC FINDINGS: Radiographs show evidence of joint space narrowing, osteophytes, subchondral sclerosis and/or subchondral cysts  Patient-Specific Activity Scoring Scheme  "0" represents "unable to perform." "10" represents "able to perform at prior level. 0 1 2 3 4 5 6 7 8 9  10 (Date and Score)   Activity Eval     1. Walk around house  5    2. Stairs   5    3. Walking in community 3   4.    5.    Score 4.33    Total score = sum of the activity scores/number of activities Minimum detectable change (90%CI) for average score = 2 points Minimum detectable change (90%CI) for single activity score = 3 points  COGNITION: Overall cognitive status: WFL   POSTURE: rounded shoulders, forward head, and flexed  trunk   PALPATION: Tenderness bilateral knees medially  LOWER EXTREMITY ROM:   ROM Right eval Left eval  Hip flexion    Hip extension Standing -11* Standing -12*  Hip abduction    Hip adduction    Hip internal rotation    Hip external rotation    Knee flexion seated A: 104* Seated A: 115*  Knee extension Seated LAQ: -8* Seated LAQ -9*  Ankle dorsiflexion    Ankle plantarflexion    Ankle inversion    Ankle eversion     (Blank rows = not tested)  LOWER EXTREMITY MMT:  MMT Right eval Left eval  Hip flexion    Hip extension    Hip abduction    Hip adduction    Hip internal rotation    Hip external rotation    Knee flexion seated 4/5 Seated 4/5  Knee extension 4/5 4/5  Ankle dorsiflexion 5/5 5/5  Ankle plantarflexion    Ankle inversion    Ankle eversion     (Blank rows = not tested)  FUNCTIONAL TESTS:  18 inch chair transfer: Requires use of single upper extremity on armrest to arise from an 18 inch chair Timed Up and Go with single-point cane: Standard 19.59 seconds and cognitive 30.40 seconds (55% increase) with need to stop naming with turn and limited ability to restart.  Both test indicate high risk of falls especially when distracted.  GAIT: Distance walked: 100' Assistive device utilized: Single point cane Level of assistance: SBA Comments: Patient has forward trunk lean, hip and knee flexion in stance, poor clearance in swing and decreased arm swing.   TODAY'S TREATMENT                                                                          DATE: 05/09/2023 Therex:    HEP instruction/performance c cues for techniques, handout provided.  Trial set performed of each for comprehension and symptom assessment.  See below for exercise list  PATIENT EDUCATION:  Education details: HEP, POC Person educated: Patient  Education method: Explanation, Demonstration, Verbal cues, and Handouts Education comprehension: verbalized understanding, returned  demonstration, and verbal cues required  HOME EXERCISE PROGRAM: Access Code: [email or web address] URL: [email or web address] Date: 05/09/2023 Prepared by: Vladimir Faster  Exercises - Seated Long Arc Quad  - 1 x daily - 7 x weekly - 2 sets - 10 reps - 5 seconds hold - Seated Hamstring Stretch with Strap  - 1 x daily - 7 x weekly - 1 sets - 3 reps - 30 seconds hold - sit to stand to sit with counter  - 1 x daily - 7 x weekly - 2 sets - 10 reps - 5 seconds hold  ASSESSMENT: CLINICAL IMPRESSION: Patient is a 83 y.o. who comes to clinic with complaints of bilateral knee pain with mobility, strength and movement coordination deficits that impair their ability to perform usual daily and recreational functional activities without increase difficulty/symptoms at this time.  She has a history of recurrent falls.  Functional outcome testing indicates high fall risk.  Patient to benefit from skilled PT services to address impairments and limitations to improve to previous level of function without restriction secondary to condition.   OBJECTIVE IMPAIRMENTS: Abnormal gait, decreased activity tolerance, decreased balance, decreased coordination, decreased endurance, decreased knowledge of condition, decreased knowledge of use of DME, decreased mobility, decreased ROM, decreased strength, dizziness, impaired flexibility, postural dysfunction, and pain.   ACTIVITY LIMITATIONS: carrying, lifting, standing, stairs, transfers, and locomotion level  PARTICIPATION LIMITATIONS: meal prep, cleaning, and community activity  PERSONAL FACTORS: Age, Fitness, Past/current experiences, and 3+ comorbidities: see PMH  are also affecting patient's functional outcome.   REHAB POTENTIAL: Good  CLINICAL DECISION MAKING: Evolving/moderate complexity  EVALUATION COMPLEXITY: Moderate   GOALS: Goals reviewed with patient? Yes  SHORT TERM GOALS: (target date for Short term goals 06/03/2023)   1.  Patient will  demonstrate independent use of home exercise program to maintain progress from in clinic treatments. Baseline: See objective data Goal status: Initial  2. Timed Up & Go <15 sec with cane Baseline: See objective data Goal status: Initial    LONG TERM GOALS: (target dates for all long term goals  07/01/2023 )   1. Patient will demonstrate/report bilateral pain at worst less than or equal to 4/10 to facilitate minimal limitation in daily activity secondary to pain symptoms. Baseline: See objective data Goal status: Initial   2. Patient will demonstrate independent use of home exercise program to facilitate ability to maintain/progress functional gains from skilled physical therapy services. Baseline: See objective data Goal status: Initial  3.  Patient reports Patient-Specific Activity Score improved the average by 3 to indicate improvement in functional activities.  Baseline: SEE OBJECTIVE DATA Goal status: INITIAL   4.  Timed Up & Go cognitive <20 sec. Baseline: See objective data Goal status: Initial   5.  Patient reports no fall in last 2 weeks.  Baseline: See objective data Goal status: Initial   PLAN:  PT FREQUENCY:  2x/week  PT DURATION: 8 weeks  PLANNED INTERVENTIONS: 97164- PT Re-evaluation, 97110-Therapeutic exercises, 97530- Therapeutic activity, 97112- Neuromuscular re-education, 97535- Self Care, 82956- Manual therapy, [phone]- Gait training, [phone]- Canalith repositioning, Patient/Family education, Balance training, Stair training, Taping, Dry Needling, Joint mobilization, Vestibular training, DME instructions, and Physical Performance Testing  PLAN FOR NEXT SESSION: Review & Update HEP.    Vladimir Faster, PT, DPT 05/09/2023, 5:23 PM

## 2023-05-11 ENCOUNTER — Encounter: Payer: Self-pay | Admitting: Physical Therapy

## 2023-05-11 ENCOUNTER — Ambulatory Visit (INDEPENDENT_AMBULATORY_CARE_PROVIDER_SITE_OTHER): Admitting: Physical Therapy

## 2023-05-11 DIAGNOSIS — R293 Abnormal posture: Secondary | ICD-10-CM

## 2023-05-11 DIAGNOSIS — R2689 Other abnormalities of gait and mobility: Secondary | ICD-10-CM | POA: Diagnosis not present

## 2023-05-11 DIAGNOSIS — M25562 Pain in left knee: Secondary | ICD-10-CM

## 2023-05-11 DIAGNOSIS — M25561 Pain in right knee: Secondary | ICD-10-CM

## 2023-05-11 DIAGNOSIS — R2681 Unsteadiness on feet: Secondary | ICD-10-CM | POA: Diagnosis not present

## 2023-05-11 DIAGNOSIS — G8929 Other chronic pain: Secondary | ICD-10-CM | POA: Diagnosis not present

## 2023-05-11 DIAGNOSIS — M6281 Muscle weakness (generalized): Secondary | ICD-10-CM

## 2023-05-11 DIAGNOSIS — Z9181 History of falling: Secondary | ICD-10-CM | POA: Diagnosis not present

## 2023-05-11 NOTE — Therapy (Signed)
 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 2011- no further tx. now- Dr. Donnie Coffin , Dr. Dayton Scrape   Cyst of finger 11/2011   annular cyst right long finger   Dental crowns present    Dermatitis    Frequency of urination    GERD (gastroesophageal reflux disease)    Hemorrhoid    History of colon polyps 2009   Also noted 2012 and 2017 by colonoscopy.   History of shingles 1971   Had right ischial recurrence in the March 2019   Hyperlipidemia    On atorvastatin   Hypertension    under control, has been on med. x 2 yrs.   Liposarcoma of left shoulder (HCC) 1969   Parkinson's disease (HCC)    With mild tremor (neurologist Dr. Marjory Lies), neurosurgeon Dr. Lovell Sheehan   PONV (postoperative nausea and vomiting)    Seasonal allergies    Shingles of eyelid 1971   Right eyelid; recurrent -> last episode 05/11/2017   Tremors of nervous system    hands-essential tremor; associated with Parkinson's.   Trigger finger of right hand 11/2011   long finger   Past Surgical History:  Procedure Laterality Date   14-day Zio Patch Monitor  08/2020   (report to be scanned): Predominant SR w/ HR 61 to 142 bpm and average 86 bpm.  Total of 59 episodes of PAT/PSVT  4 to 15 beats (not noted on diary).  Fastest was 5 beats at a rate of 193 bpm, longest  was 15 beats at a rate of 106 bpm.  Rare isolated PACs (as well as couplets and triplets) with rare isolated PVCs.  No sustained arrhythmias or bradycardia to explain syncope.   ANTERIOR CERVICAL DECOMPRESSION/DISCECTOMY FUSION 4 LEVELS N/A 11/14/2013   Procedure: ANTERIOR CERVICAL DECOMPRESSION/DISCECTOMY FUSION 4 LEVELS;  Surgeon: Tressie Stalker, MD;  Location: [facility];  Service: Neurosurgery;  Laterality: N/A;  C34 C45 C56 C67 anterior cervical fusion with interbody prosthesis plating and  bonegraft   APPENDECTOMY  age 79   BREAST LUMPECTOMY  06/16/2009   left; SLN bx.   BREAST LUMPECTOMY  07/01/2009   re-excision   BREAST SURGERY  02/22/1997   reduction   CATARACT EXTRACTION, BILATERAL     COLONOSCOPY WITH PROPOFOL N/A 12/03/2014   Procedure: COLONOSCOPY WITH PROPOFOL;  Surgeon: Charolett Bumpers, MD;  Location: [facility];  Service: Endoscopy;  Laterality: N/A;   FOOT SURGERY  06/22/2011   left   KNEE ARTHROSCOPY  03/12/2005   right   KNEE ARTHROSCOPY     left   Lower Extremity Venous Dopplers  12/12/2020   No DVT bilaterally in the deep veins or superficial veins.  No deep or superficial venous reflux noted bilaterally with exception of Right SSV at the knee.   NASAL SINUS SURGERY     x 2   NEPHRECTOMY LIVING DONOR Left 04/22/2008   donated to spouse 2010([facility])   TRANSTHORACIC ECHOCARDIOGRAM  12/10/2020   EF 60 to 65%.  Normal LV size and function.  No heart WMA.  GR 1 DD.  Normal RV, RVP and RAP.[name]  Normal valves.   TRIGGER FINGER RELEASE  12/16/2011   Procedure: RELEASE TRIGGER FINGER/A-1 PULLEY;  Surgeon: Tami Ribas, MD;  Location: [facility];  Service: Orthopedics;  Laterality: Right;  RIGHT LONG FINGER TRIGGER RELEASE & ANNULAR CYST EXCISION   TUMOR EXCISION  age 57   right arm   Patient Active Problem List   Diagnosis Date Noted   Rectal bleeding 07/27/2022   Hemorrhoids 07/27/2022   Chronic pain of both knees 06/21/2022   Moderate  persistent asthma without complication 03/27/2021   PSVT (paroxysmal supraventricular tachycardia) (HCC) 11/26/2020   Syncope and collapse 11/24/2020   Primary osteoarthritis of right knee 05/12/2020   Primary osteoarthritis of left knee 05/12/2020   Hyperlipidemia    Bilateral calf pain 03/05/2019   Genetic testing 02/13/2019   Family history of ovarian cancer    Family history of bladder cancer    Family history of colon cancer    Family history of leukemia    Lumbar radiculopathy 12/08/2018   Myofascial pain 12/08/2018   Impaired gait and mobility 12/08/2018   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   Chronic nonallergic rhinitis 12/29/2016   Mild persistent asthma, uncomplicated 12/29/2016   Vocal fold paralysis, 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   Gastroesophageal reflux disease 12/29/2016   Cervical spondylosis with myelopathy and radiculopathy 11/14/2013   Essential tremor 06/13/2013   Cervical spondylosis without myelopathy 06/13/2013   Breast cancer of lower-outer quadrant of left female breast (HCC) 09/10/2010    PCP: Lorenda Ishihara, MD  REFERRING PROVIDER: Madelyn Brunner, MD  REFERRING DIAG: M17.0 (ICD-10-CM) - Bilateral primary osteoarthritis of knee   THERAPY DIAG:  Unsteadiness on feet  Abnormal posture  Chronic pain of both knees  Other abnormalities of gait and mobility  History of falling  Muscle weakness (generalized)  Rationale for Evaluation and Treatment: Rehabilitation  ONSET DATE: 04/28/2023 MD referral to PT  SUBJECTIVE:   SUBJECTIVE STATEMENT: She did her exercises without any issues.   PERTINENT HISTORY: Cervical Decompression surgery 2015, Supraventicular tachycardia, syncope, OA, breast CA, Donated right kidney to her husband, HTN, Parkinson's Disease with slight tremor,   PAIN:  NPRS scale:  0/10,  in last week 10/10 Pain location: knees RLE>LLE more medially Pain description: bad ache Aggravating factors: walking,  standing more than few  minutes Relieving factors: injections,  tylenol takes edge off,  sleeping with pillow bw knees.   PRECAUTIONS: Fall  WEIGHT BEARING RESTRICTIONS: No  FALLS:  Has patient fallen in last 6 months? Yes. Number of falls 3 with knot on head,   LIVING ENVIRONMENT: Lives with: lives alone Lives in: Townhouse 2-story with master bedroom & bathroom downstairs,  Stairs: Yes: Internal: 14-15 steps; on right going up and on left going up and External: 4 steps; can reach both Has following equipment at home: Single point cane, Walker - 4 wheeled, shower chair, and Grab bars  OCCUPATION: retired  PLOF: Independent  PATIENT GOALS:  learn exercises to function better and with less pain.   Next MD visit:  OBJECTIVE:  DIAGNOSTIC FINDINGS: Radiographs show evidence of joint space  narrowing, osteophytes, subchondral sclerosis and/or subchondral cysts  Patient-Specific Activity Scoring Scheme  "0" represents "unable to perform." "10" represents "able to perform at prior level. 0 1 2 3 4 5 6 7 8 9  10 (Date and Score)   Activity Eval     1. Walk around house  5    2. Stairs   5    3. Walking in community 3   4.    5.    Score 4.33    Total score = sum of the activity scores/number of activities Minimum detectable change (90%CI) for average score = 2 points Minimum detectable change (90%CI) for single activity score = 3 points  COGNITION: Overall cognitive status: WFL   POSTURE: rounded shoulders, forward head, and flexed trunk   PALPATION: Tenderness bilateral knees medially  LOWER EXTREMITY ROM:   ROM Right eval Left eval  Hip flexion    Hip extension Standing -11* Standing -12*  Hip abduction    Hip adduction    Hip internal rotation    Hip external rotation    Knee flexion seated A: 104* Seated A: 115*  Knee extension Seated LAQ: -8* Seated LAQ -9*  Ankle dorsiflexion    Ankle plantarflexion    Ankle inversion    Ankle eversion     (Blank rows = not  tested)  LOWER EXTREMITY MMT:  MMT Right eval Left eval  Hip flexion    Hip extension    Hip abduction    Hip adduction    Hip internal rotation    Hip external rotation    Knee flexion seated 4/5 Seated 4/5  Knee extension 4/5 4/5  Ankle dorsiflexion 5/5 5/5  Ankle plantarflexion    Ankle inversion    Ankle eversion     (Blank rows = not tested)  FUNCTIONAL TESTS:  18 inch chair transfer: Requires use of single upper extremity on armrest to arise from an 18 inch chair Timed Up and Go with single-point cane: Standard 19.59 seconds and cognitive 30.40 seconds (55% increase) with need to stop naming with turn and limited ability to restart.  Both test indicate high risk of falls especially when distracted.  GAIT: Distance walked: 100' Assistive device utilized: Single point cane Level of assistance: SBA Comments: Patient has forward trunk lean, hip and knee flexion in stance, poor clearance in swing and decreased arm swing.   TODAY'S TREATMENT                                                                          DATE: 05/11/2023 Therapeutic Exercise: Hip flexor stretch hooklying LE over edge 30 sec hold 3 reps per LE.  PT demo & verbal cues how hip flexor tightness may effect her knees. Bridge 10 reps 2 sets 5 sec hold Squat over chair 5 sec hold 10 reps 2 sets added pillow squeeze between LEs to 2nd set with pt reporting less knee pain. Seated LAQ alternating LEs for 10 reps Seated hamstring stretch using her cane for DF 30 sec 2 reps per LE PT updated HEP with HO, demo & verbal cues.  Pt verbalized understanding after completing during session.    Self-Care: PT instructed pt in using pillows to "tent" sheets  off feet to help decrease ankle position and resistance to turning over.  Pt verbalized understanding.    TREATMENT                                                                          DATE: 05/09/2023 Therex:    HEP instruction/performance c cues for  techniques, handout provided.  Trial set performed of each for comprehension and symptom assessment.  See below for exercise list  PATIENT EDUCATION:  Education details: HEP, POC Person educated: Patient Education method: Explanation, Demonstration, Verbal cues, and Handouts Education comprehension: verbalized understanding, returned demonstration, and verbal cues required  HOME EXERCISE PROGRAM: Access Code: [email or web address] URL: [email or web address] Date: 05/11/2023 Prepared by: Vladimir Faster  Exercises - Seated Long Arc Quad  - 1 x daily - 7 x weekly - 2 sets - 10 reps - 5 seconds hold - Seated Hamstring Stretch with Strap  - 1 x daily - 7 x weekly - 1 sets - 3 reps - 30 seconds hold - sit to stand to sit with counter  - 1 x daily - 7 x weekly - 2 sets - 10 reps - 5 seconds hold - squat at sink with chair behind you  - 1 x daily - 7 x weekly - 1 sets - 10 reps - 5 seconds hold - Modified [name] Stretch  - 1 x daily - 7 x weekly - 1 sets - 3 reps - 30 seconds hold - Supine Bridge  - 1 x daily - 7 x weekly - 2 sets - 10 reps - 5 seconds hold  ASSESSMENT: CLINICAL IMPRESSION: PT updated HEP which patient appears to understand.  PT progressed HEP to include hip range and strength as hip position seems to flex her knees in stance and probably part of pain issue.    OBJECTIVE IMPAIRMENTS: Abnormal gait, decreased activity tolerance, decreased balance, decreased coordination, decreased endurance, decreased knowledge of condition, decreased knowledge of use of DME, decreased mobility, decreased ROM, decreased strength, dizziness, impaired flexibility, postural dysfunction, and pain.   ACTIVITY LIMITATIONS: carrying, lifting, standing, stairs, transfers, and locomotion level  PARTICIPATION LIMITATIONS: meal prep, cleaning, and community activity  PERSONAL FACTORS: Age, Fitness, Past/current experiences, and 3+ comorbidities: see PMH  are also affecting patient's functional outcome.    REHAB POTENTIAL: Good  CLINICAL DECISION MAKING: Evolving/moderate complexity  EVALUATION COMPLEXITY: Moderate   GOALS: Goals reviewed with patient? Yes  SHORT TERM GOALS: (target date for Short term goals 06/03/2023)   1.  Patient will demonstrate independent use of home exercise program to maintain progress from in clinic treatments. Baseline: See objective data Goal status: Ongoing  05/11/2023  2. Timed Up & Go <15 sec with cane Baseline: See objective data Goal status: Ongoing  05/11/2023    LONG TERM GOALS: (target dates for all long term goals  07/01/2023 )   1. Patient will demonstrate/report bilateral pain at worst less than or equal to 4/10 to facilitate minimal limitation in daily activity secondary to pain symptoms. Baseline: See objective data Goal status: Ongoing  05/11/2023   2. Patient will demonstrate independent use of home exercise program to facilitate ability to maintain/progress functional gains from skilled physical therapy services. Baseline: See objective data Goal  status: Ongoing  05/11/2023  3.  Patient reports Patient-Specific Activity Score improved the average by 3 to indicate improvement in functional activities.  Baseline: SEE OBJECTIVE DATA Goal status: Ongoing  05/11/2023   4.  Timed Up & Go cognitive <20 sec. Baseline: See objective data Goal status: Ongoing  05/11/2023   5.  Patient reports no fall in last 2 weeks.  Baseline: See objective data Goal status: Ongoing  05/11/2023   PLAN:  PT FREQUENCY:  2x/week  PT DURATION: 8 weeks  PLANNED INTERVENTIONS: 97164- PT Re-evaluation, 97110-Therapeutic exercises, 97530- Therapeutic activity, 97112- Neuromuscular re-education, 97535- Self Care, 60454- Manual therapy, [phone]- Gait training, [phone]- Canalith repositioning, Patient/Family education, Balance training, Stair training, Taping, Dry Needling, Joint mobilization, Vestibular training, DME instructions, and Physical Performance  Testing  PLAN FOR NEXT SESSION: Continue to work on range and strength for hips, knees and gastroc. Ask about pain sleeping.  Pain management.    Vladimir Faster, PT, DPT 05/11/2023, 4:39 PM

## 2023-05-12 DIAGNOSIS — I10 Essential (primary) hypertension: Secondary | ICD-10-CM | POA: Diagnosis not present

## 2023-05-13 ENCOUNTER — Telehealth: Payer: Self-pay

## 2023-05-13 NOTE — Telephone Encounter (Signed)
-----   Message from Lomita W sent at 04/28/2023  1:33 PM EST ----- Regarding: Gel injections - bilateral knees Hey Trayvond Viets,  Could we please get prior authorization for gel injections for this patient? Bilateral knees.  Thank you! Isabelle Course

## 2023-05-13 NOTE — Telephone Encounter (Signed)
 VOB submitted for Monovisc, bilateral knee

## 2023-05-16 ENCOUNTER — Encounter: Payer: Self-pay | Admitting: Physical Therapy

## 2023-05-16 ENCOUNTER — Ambulatory Visit (INDEPENDENT_AMBULATORY_CARE_PROVIDER_SITE_OTHER): Admitting: Physical Therapy

## 2023-05-16 DIAGNOSIS — R2681 Unsteadiness on feet: Secondary | ICD-10-CM

## 2023-05-16 DIAGNOSIS — M25561 Pain in right knee: Secondary | ICD-10-CM

## 2023-05-16 DIAGNOSIS — R293 Abnormal posture: Secondary | ICD-10-CM | POA: Diagnosis not present

## 2023-05-16 DIAGNOSIS — R2689 Other abnormalities of gait and mobility: Secondary | ICD-10-CM | POA: Diagnosis not present

## 2023-05-16 DIAGNOSIS — G8929 Other chronic pain: Secondary | ICD-10-CM

## 2023-05-16 DIAGNOSIS — M25562 Pain in left knee: Secondary | ICD-10-CM | POA: Diagnosis not present

## 2023-05-16 NOTE — Therapy (Signed)
 OUTPATIENT PHYSICAL THERAPY LOWER EXTREMITY TREATMENT   Patient Name: April Church MRN: [id number] DOB:08/15/1940, 83 y.o., female Today's Date: 05/16/2023  END OF SESSION:  PT End of Session - 05/16/23 1459     Visit Number 3    Number of Visits 16    Date for PT Re-Evaluation 07/01/23    Authorization Type Medicare & BCBS supplement    Progress Note Due on Visit 10    PT Start Time 1430    PT Stop Time 1510    PT Time Calculation (min) 40 min    Activity Tolerance Patient tolerated treatment well    Behavior During Therapy [facility] for tasks assessed/performed              Past Medical History:  Diagnosis Date   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 2011- no further tx. now- Dr. Donnie Coffin , Dr. Dayton Scrape   Cyst of finger 11/2011   annular cyst right long finger   Dental crowns present    Dermatitis    Frequency of urination    GERD (gastroesophageal reflux disease)    Hemorrhoid    History of colon polyps 2009   Also noted 2012 and 2017 by colonoscopy.   History of shingles 1971   Had right ischial recurrence in the March 2019   Hyperlipidemia    On atorvastatin   Hypertension    under control, has been on med. x 2 yrs.   Liposarcoma of left shoulder (HCC) 1969   Parkinson's disease (HCC)    With mild tremor (neurologist Dr. Marjory Lies), neurosurgeon Dr. Lovell Sheehan   PONV (postoperative nausea and vomiting)    Seasonal allergies    Shingles of eyelid 1971   Right eyelid; recurrent -> last episode 05/11/2017   Tremors of nervous system    hands-essential tremor; associated with Parkinson's.   Trigger finger of right hand 11/2011   long finger   Past Surgical History:  Procedure Laterality Date   14-day Zio Patch Monitor  08/2020   (report to be scanned): Predominant SR w/ HR 61 to 142 bpm and average 86 bpm.  Total  of 59 episodes of PAT/PSVT  4 to 15 beats (not noted on diary).  Fastest was 5 beats at a rate of 193 bpm, longest was 15 beats at a rate of 106 bpm.  Rare isolated PACs (as well as couplets and triplets) with rare isolated PVCs.  No sustained arrhythmias or bradycardia to explain syncope.   ANTERIOR CERVICAL DECOMPRESSION/DISCECTOMY FUSION 4 LEVELS N/A 11/14/2013   Procedure: ANTERIOR CERVICAL DECOMPRESSION/DISCECTOMY FUSION 4 LEVELS;  Surgeon: Tressie Stalker, MD;  Location: [facility];  Service: Neurosurgery;  Laterality: N/A;  C34 C45 C56 C67 anterior cervical fusion with interbody prosthesis plating and  bonegraft   APPENDECTOMY  age 6   BREAST LUMPECTOMY  06/16/2009   left; SLN bx.   BREAST LUMPECTOMY  07/01/2009   re-excision   BREAST SURGERY  02/22/1997   reduction   CATARACT EXTRACTION, BILATERAL     COLONOSCOPY WITH PROPOFOL N/A 12/03/2014   Procedure: COLONOSCOPY WITH PROPOFOL;  Surgeon: Charolett Bumpers, MD;  Location: [facility];  Service: Endoscopy;  Laterality: N/A;   FOOT SURGERY  06/22/2011   left   KNEE ARTHROSCOPY  03/12/2005   right   KNEE ARTHROSCOPY     left  Lower Extremity Venous Dopplers  12/12/2020   No DVT bilaterally in the deep veins or superficial veins.  No deep or superficial venous reflux noted bilaterally with exception of Right SSV at the knee.   NASAL SINUS SURGERY     x 2   NEPHRECTOMY LIVING DONOR Left 04/22/2008   donated to spouse 2010([facility])   TRANSTHORACIC ECHOCARDIOGRAM  12/10/2020   EF 60 to 65%.  Normal LV size and function.  No heart WMA.  GR 1 DD.  Normal RV, RVP and RAP.[name]  Normal valves.   TRIGGER FINGER RELEASE  12/16/2011   Procedure: RELEASE TRIGGER FINGER/A-1 PULLEY;  Surgeon: Tami Ribas, MD;  Location: [facility];  Service: Orthopedics;  Laterality: Right;  RIGHT LONG FINGER TRIGGER RELEASE & ANNULAR CYST EXCISION   TUMOR EXCISION  age 75   right arm   Patient Active Problem List   Diagnosis Date Noted    Rectal bleeding 07/27/2022   Hemorrhoids 07/27/2022   Chronic pain of both knees 06/21/2022   Moderate persistent asthma without complication 03/27/2021   PSVT (paroxysmal supraventricular tachycardia) (HCC) 11/26/2020   Syncope and collapse 11/24/2020   Primary osteoarthritis of right knee 05/12/2020   Primary osteoarthritis of left knee 05/12/2020   Hyperlipidemia    Bilateral calf pain 03/05/2019   Genetic testing 02/13/2019   Family history of ovarian cancer    Family history of bladder cancer    Family history of colon cancer    Family history of leukemia    Lumbar radiculopathy 12/08/2018   Myofascial pain 12/08/2018   Impaired gait and mobility 12/08/2018   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   Chronic nonallergic rhinitis 12/29/2016   Mild persistent asthma, uncomplicated 12/29/2016   Vocal fold paralysis, 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   Gastroesophageal reflux disease 12/29/2016   Cervical spondylosis with myelopathy and radiculopathy 11/14/2013   Essential tremor 06/13/2013   Cervical spondylosis without myelopathy 06/13/2013   Breast cancer of lower-outer quadrant of left female breast (HCC) 09/10/2010    PCP: Lorenda Ishihara, MD  REFERRING PROVIDER: Madelyn Brunner, MD  REFERRING DIAG: M17.0 (ICD-10-CM) - Bilateral primary osteoarthritis of knee   THERAPY DIAG:  Unsteadiness on feet  Abnormal posture  Chronic pain of both knees  Other abnormalities of gait and mobility  Rationale for Evaluation and Treatment: Rehabilitation  ONSET DATE: 04/28/2023 MD referral to PT  SUBJECTIVE:   SUBJECTIVE STATEMENT: She relays not much pain today. Says she was unable to fold up her walker to get in the car so she arrives with cane today   PERTINENT HISTORY: Cervical Decompression surgery 2015, Supraventicular tachycardia, syncope, OA, breast CA, Donated right kidney to her husband, HTN, Parkinson's Disease with slight tremor,   PAIN:  NPRS scale:  0/10,  in last week  10/10 Pain location: knees RLE>LLE more medially Pain description: bad ache Aggravating factors: walking,  standing more than few minutes Relieving factors: injections,  tylenol takes edge off,  sleeping with pillow bw knees.   PRECAUTIONS: Fall  WEIGHT BEARING RESTRICTIONS: No  FALLS:  Has patient fallen in last 6 months? Yes. Number of falls 3 with knot on head,   LIVING ENVIRONMENT: Lives with: lives alone Lives in: Townhouse 2-story with master bedroom & bathroom downstairs,  Stairs: Yes: Internal: 14-15 steps; on right going up and on left going up and External: 4 steps; can reach both Has following equipment at home: Single point cane, Walker - 4 wheeled, shower chair, and Grab bars  OCCUPATION: retired  PLOF:  Independent  PATIENT GOALS:  learn exercises to function better and with less pain.   Next MD visit:  OBJECTIVE:  DIAGNOSTIC FINDINGS: Radiographs show evidence of joint space narrowing, osteophytes, subchondral sclerosis and/or subchondral cysts  Patient-Specific Activity Scoring Scheme  "0" represents "unable to perform." "10" represents "able to perform at prior level. 0 1 2 3 4 5 6 7 8 9  10 (Date and Score)   Activity Eval     1. Walk around house  5    2. Stairs   5    3. Walking in community 3   4.    5.    Score 4.33    Total score = sum of the activity scores/number of activities Minimum detectable change (90%CI) for average score = 2 points Minimum detectable change (90%CI) for single activity score = 3 points  COGNITION: Overall cognitive status: WFL   POSTURE: rounded shoulders, forward head, and flexed trunk   PALPATION: Tenderness bilateral knees medially  LOWER EXTREMITY ROM:   ROM Right eval Left eval  Hip flexion    Hip extension Standing -11* Standing -12*  Hip abduction    Hip adduction    Hip internal rotation    Hip external rotation    Knee flexion seated A: 104* Seated A: 115*  Knee extension Seated LAQ: -8*  Seated LAQ -9*  Ankle dorsiflexion    Ankle plantarflexion    Ankle inversion    Ankle eversion     (Blank rows = not tested)  LOWER EXTREMITY MMT:  MMT Right eval Left eval  Hip flexion    Hip extension    Hip abduction    Hip adduction    Hip internal rotation    Hip external rotation    Knee flexion seated 4/5 Seated 4/5  Knee extension 4/5 4/5  Ankle dorsiflexion 5/5 5/5  Ankle plantarflexion    Ankle inversion    Ankle eversion     (Blank rows = not tested)  FUNCTIONAL TESTS:  18 inch chair transfer: Requires use of single upper extremity on armrest to arise from an 18 inch chair Timed Up and Go with single-point cane: Standard 19.59 seconds and cognitive 30.40 seconds (55% increase) with need to stop naming with turn and limited ability to restart.  Both test indicate high risk of falls especially when distracted.  GAIT: Distance walked: 100' Assistive device utilized: Single point cane Level of assistance: SBA Comments: Patient has forward trunk lean, hip and knee flexion in stance, poor clearance in swing and decreased arm swing.   TODAY'S TREATMENT                                                                          DATE: 05/16/2023 Therapeutic Exercise: Hip flexor stretch hooklying LE over edge 30 sec hold 3 reps per LE.  Bridge 10 reps 2 sets 5 sec hold Supine clams green X 15 reps Supine bridges 5 sec hold X 10 Mini squats at counter top X 10 reps Seated LAQ alternating LEs for 2 min Seated hamstring stretch with strap 30 sec X bilat Nu step L5 X 8 min UE/LE  05/11/2023 Therapeutic Exercise: Hip flexor stretch hooklying LE over edge 30 sec hold  3 reps per LE.  PT demo & verbal cues how hip flexor tightness may effect her knees. Bridge 10 reps 2 sets 5 sec hold Squat over chair 5 sec hold 10 reps 2 sets added pillow squeeze between LEs to 2nd set with pt reporting less knee pain. Seated LAQ alternating LEs for 10 reps Seated hamstring stretch  using her cane for DF 30 sec 2 reps per LE PT updated HEP with HO, demo & verbal cues.  Pt verbalized understanding after completing during session.    Self-Care: PT instructed pt in using pillows to "tent" sheets off feet to help decrease ankle position and resistance to turning over.  Pt verbalized understanding.    TREATMENT                                                                          DATE: 05/09/2023 Therex:    HEP instruction/performance c cues for techniques, handout provided.  Trial set performed of each for comprehension and symptom assessment.  See below for exercise list  PATIENT EDUCATION:  Education details: HEP, POC Person educated: Patient Education method: Explanation, Demonstration, Verbal cues, and Handouts Education comprehension: verbalized understanding, returned demonstration, and verbal cues required  HOME EXERCISE PROGRAM: Access Code: [email or web address] URL: [email or web address] Date: 05/11/2023 Prepared by: Vladimir Faster  Exercises - Seated Long Arc Quad  - 1 x daily - 7 x weekly - 2 sets - 10 reps - 5 seconds hold - Seated Hamstring Stretch with Strap  - 1 x daily - 7 x weekly - 1 sets - 3 reps - 30 seconds hold - sit to stand to sit with counter  - 1 x daily - 7 x weekly - 2 sets - 10 reps - 5 seconds hold - squat at sink with chair behind you  - 1 x daily - 7 x weekly - 1 sets - 10 reps - 5 seconds hold - Modified [name] Stretch  - 1 x daily - 7 x weekly - 1 sets - 3 reps - 30 seconds hold - Supine Bridge  - 1 x daily - 7 x weekly - 2 sets - 10 reps - 5 seconds hold  ASSESSMENT: CLINICAL IMPRESSION:Session focused on hip/knee strengthening with good overall tolerance noted. Her pain did not increase with activity today. She will continue to benefit from skilled PT to work towards her functional goals.  OBJECTIVE IMPAIRMENTS: Abnormal gait, decreased activity tolerance, decreased balance, decreased coordination, decreased endurance,  decreased knowledge of condition, decreased knowledge of use of DME, decreased mobility, decreased ROM, decreased strength, dizziness, impaired flexibility, postural dysfunction, and pain.   ACTIVITY LIMITATIONS: carrying, lifting, standing, stairs, transfers, and locomotion level  PARTICIPATION LIMITATIONS: meal prep, cleaning, and community activity  PERSONAL FACTORS: Age, Fitness, Past/current experiences, and 3+ comorbidities: see PMH  are also affecting patient's functional outcome.   REHAB POTENTIAL: Good  CLINICAL DECISION MAKING: Evolving/moderate complexity  EVALUATION COMPLEXITY: Moderate   GOALS: Goals reviewed with patient? Yes  SHORT TERM GOALS: (target date for Short term goals 06/03/2023)   1.  Patient will demonstrate independent use of home exercise program to maintain progress from in clinic treatments. Baseline: See objective data Goal status: Ongoing  05/11/2023  2. Timed Up &  Go <15 sec with cane Baseline: See objective data Goal status: Ongoing  05/11/2023    LONG TERM GOALS: (target dates for all long term goals  07/01/2023 )   1. Patient will demonstrate/report bilateral pain at worst less than or equal to 4/10 to facilitate minimal limitation in daily activity secondary to pain symptoms. Baseline: See objective data Goal status: Ongoing  05/11/2023   2. Patient will demonstrate independent use of home exercise program to facilitate ability to maintain/progress functional gains from skilled physical therapy services. Baseline: See objective data Goal status: Ongoing  05/11/2023  3.  Patient reports Patient-Specific Activity Score improved the average by 3 to indicate improvement in functional activities.  Baseline: SEE OBJECTIVE DATA Goal status: Ongoing  05/11/2023   4.  Timed Up & Go cognitive <20 sec. Baseline: See objective data Goal status: Ongoing  05/11/2023   5.  Patient reports no fall in last 2 weeks.  Baseline: See objective data Goal  status: Ongoing  05/11/2023   PLAN:  PT FREQUENCY:  2x/week  PT DURATION: 8 weeks  PLANNED INTERVENTIONS: 97164- PT Re-evaluation, 97110-Therapeutic exercises, 97530- Therapeutic activity, 97112- Neuromuscular re-education, 97535- Self Care, [id number]- Manual therapy, [phone]- Gait training, [phone]- Canalith repositioning, Patient/Family education, Balance training, Stair training, Taping, Dry Needling, Joint mobilization, Vestibular training, DME instructions, and Physical Performance Testing  PLAN FOR NEXT SESSION: Continue to work on range and strength for hips, knees.    April Manson, PT, DPT 05/16/2023, 3:03 PM

## 2023-05-17 DIAGNOSIS — H524 Presbyopia: Secondary | ICD-10-CM | POA: Diagnosis not present

## 2023-05-17 DIAGNOSIS — Z961 Presence of intraocular lens: Secondary | ICD-10-CM | POA: Diagnosis not present

## 2023-05-17 DIAGNOSIS — H04123 Dry eye syndrome of bilateral lacrimal glands: Secondary | ICD-10-CM | POA: Diagnosis not present

## 2023-05-18 ENCOUNTER — Other Ambulatory Visit: Payer: Self-pay

## 2023-05-18 DIAGNOSIS — M170 Bilateral primary osteoarthritis of knee: Secondary | ICD-10-CM

## 2023-05-19 ENCOUNTER — Ambulatory Visit: Admitting: Sports Medicine

## 2023-05-23 ENCOUNTER — Encounter: Payer: Self-pay | Admitting: Physical Therapy

## 2023-05-23 ENCOUNTER — Ambulatory Visit (INDEPENDENT_AMBULATORY_CARE_PROVIDER_SITE_OTHER): Admitting: Physical Therapy

## 2023-05-23 DIAGNOSIS — G20C Parkinsonism, unspecified: Secondary | ICD-10-CM | POA: Diagnosis not present

## 2023-05-23 DIAGNOSIS — Z9181 History of falling: Secondary | ICD-10-CM

## 2023-05-23 DIAGNOSIS — C50512 Malignant neoplasm of lower-outer quadrant of left female breast: Secondary | ICD-10-CM | POA: Diagnosis not present

## 2023-05-23 DIAGNOSIS — R2681 Unsteadiness on feet: Secondary | ICD-10-CM

## 2023-05-23 DIAGNOSIS — M25562 Pain in left knee: Secondary | ICD-10-CM | POA: Diagnosis not present

## 2023-05-23 DIAGNOSIS — M6281 Muscle weakness (generalized): Secondary | ICD-10-CM

## 2023-05-23 DIAGNOSIS — R2689 Other abnormalities of gait and mobility: Secondary | ICD-10-CM | POA: Diagnosis not present

## 2023-05-23 DIAGNOSIS — E785 Hyperlipidemia, unspecified: Secondary | ICD-10-CM | POA: Diagnosis not present

## 2023-05-23 DIAGNOSIS — F3341 Major depressive disorder, recurrent, in partial remission: Secondary | ICD-10-CM | POA: Diagnosis not present

## 2023-05-23 DIAGNOSIS — R293 Abnormal posture: Secondary | ICD-10-CM | POA: Diagnosis not present

## 2023-05-23 DIAGNOSIS — G8929 Other chronic pain: Secondary | ICD-10-CM

## 2023-05-23 DIAGNOSIS — M25561 Pain in right knee: Secondary | ICD-10-CM | POA: Diagnosis not present

## 2023-05-23 NOTE — Therapy (Signed)
 OUTPATIENT PHYSICAL THERAPY LOWER EXTREMITY TREATMENT   Patient Name: April Church MRN: [id number] DOB:03/20/1940, 83 y.o., female Today's Date: 05/23/2023  END OF SESSION:  PT End of Session - 05/23/23 1429     Visit Number 4    Number of Visits 16    Date for PT Re-Evaluation 07/01/23    Authorization Type Medicare & BCBS supplement    Progress Note Due on Visit 10    PT Start Time 1345    PT Stop Time 1426    PT Time Calculation (min) 41 min    Activity Tolerance Patient tolerated treatment well    Behavior During Therapy [facility] for tasks assessed/performed               Past Medical History:  Diagnosis Date   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 2011- no further tx. now- Dr. Donnie Coffin , Dr. Dayton Scrape   Cyst of finger 11/2011   annular cyst right long finger   Dental crowns present    Dermatitis    Frequency of urination    GERD (gastroesophageal reflux disease)    Hemorrhoid    History of colon polyps 2009   Also noted 2012 and 2017 by colonoscopy.   History of shingles 1971   Had right ischial recurrence in the March 2019   Hyperlipidemia    On atorvastatin   Hypertension    under control, has been on med. x 2 yrs.   Liposarcoma of left shoulder (HCC) 1969   Parkinson's disease (HCC)    With mild tremor (neurologist Dr. Marjory Lies), neurosurgeon Dr. Lovell Sheehan   PONV (postoperative nausea and vomiting)    Seasonal allergies    Shingles of eyelid 1971   Right eyelid; recurrent -> last episode 05/11/2017   Tremors of nervous system    hands-essential tremor; associated with Parkinson's.   Trigger finger of right hand 11/2011   long finger   Past Surgical History:  Procedure Laterality Date   14-day Zio Patch Monitor  08/2020   (report to be scanned): Predominant SR w/ HR 61 to 142 bpm and average 86 bpm.   Total of 59 episodes of PAT/PSVT  4 to 15 beats (not noted on diary).  Fastest was 5 beats at a rate of 193 bpm, longest was 15 beats at a rate of 106 bpm.  Rare isolated PACs (as well as couplets and triplets) with rare isolated PVCs.  No sustained arrhythmias or bradycardia to explain syncope.   ANTERIOR CERVICAL DECOMPRESSION/DISCECTOMY FUSION 4 LEVELS N/A 11/14/2013   Procedure: ANTERIOR CERVICAL DECOMPRESSION/DISCECTOMY FUSION 4 LEVELS;  Surgeon: Tressie Stalker, MD;  Location: [facility];  Service: Neurosurgery;  Laterality: N/A;  C34 C45 C56 C67 anterior cervical fusion with interbody prosthesis plating and  bonegraft   APPENDECTOMY  age 65   BREAST LUMPECTOMY  06/16/2009   left; SLN bx.   BREAST LUMPECTOMY  07/01/2009   re-excision   BREAST SURGERY  02/22/1997   reduction   CATARACT EXTRACTION, BILATERAL     COLONOSCOPY WITH PROPOFOL N/A 12/03/2014   Procedure: COLONOSCOPY WITH PROPOFOL;  Surgeon: Charolett Bumpers, MD;  Location: [facility];  Service: Endoscopy;  Laterality: N/A;   FOOT SURGERY  06/22/2011   left   KNEE ARTHROSCOPY  03/12/2005   right   KNEE ARTHROSCOPY     left  Lower Extremity Venous Dopplers  12/12/2020   No DVT bilaterally in the deep veins or superficial veins.  No deep or superficial venous reflux noted bilaterally with exception of Right SSV at the knee.   NASAL SINUS SURGERY     x 2   NEPHRECTOMY LIVING DONOR Left 04/22/2008   donated to spouse 2010([facility])   TRANSTHORACIC ECHOCARDIOGRAM  12/10/2020   EF 60 to 65%.  Normal LV size and function.  No heart WMA.  GR 1 DD.  Normal RV, RVP and RAP.[name]  Normal valves.   TRIGGER FINGER RELEASE  12/16/2011   Procedure: RELEASE TRIGGER FINGER/A-1 PULLEY;  Surgeon: Tami Ribas, MD;  Location: [facility];  Service: Orthopedics;  Laterality: Right;  RIGHT LONG FINGER TRIGGER RELEASE & ANNULAR CYST EXCISION   TUMOR EXCISION  age 72   right arm   Patient Active Problem List   Diagnosis Date  Noted   Rectal bleeding 07/27/2022   Hemorrhoids 07/27/2022   Chronic pain of both knees 06/21/2022   Moderate persistent asthma without complication 03/27/2021   PSVT (paroxysmal supraventricular tachycardia) (HCC) 11/26/2020   Syncope and collapse 11/24/2020   Primary osteoarthritis of right knee 05/12/2020   Primary osteoarthritis of left knee 05/12/2020   Hyperlipidemia    Bilateral calf pain 03/05/2019   Genetic testing 02/13/2019   Family history of ovarian cancer    Family history of bladder cancer    Family history of colon cancer    Family history of leukemia    Lumbar radiculopathy 12/08/2018   Myofascial pain 12/08/2018   Impaired gait and mobility 12/08/2018   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   Chronic nonallergic rhinitis 12/29/2016   Mild persistent asthma, uncomplicated 12/29/2016   Vocal fold paralysis, 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   Gastroesophageal reflux disease 12/29/2016   Cervical spondylosis with myelopathy and radiculopathy 11/14/2013   Essential tremor 06/13/2013   Cervical spondylosis without myelopathy 06/13/2013   Breast cancer of lower-outer quadrant of left female breast (HCC) 09/10/2010    PCP: Lorenda Ishihara, MD  REFERRING PROVIDER: Madelyn Brunner, MD  REFERRING DIAG: M17.0 (ICD-10-CM) - Bilateral primary osteoarthritis of knee   THERAPY DIAG:  Unsteadiness on feet  Abnormal posture  Chronic pain of both knees  Other abnormalities of gait and mobility  History of falling  Muscle weakness (generalized)  Rationale for Evaluation and Treatment: Rehabilitation  ONSET DATE: 04/28/2023 MD referral to PT  SUBJECTIVE:   SUBJECTIVE STATEMENT: She relays not much pain today. Says she was unable to fold up her walker to get in the car so she arrives with cane today   PERTINENT HISTORY: Cervical Decompression surgery 2015, Supraventicular tachycardia, syncope, OA, breast CA, Donated right kidney to her husband, HTN, Parkinson's Disease with  slight tremor,   PAIN:  NPRS scale:  0/10,  in last week 10/10 Pain location: knees RLE>LLE more medially Pain description: bad ache Aggravating factors: walking,  standing more than few minutes Relieving factors: injections,  tylenol takes edge off,  sleeping with pillow bw knees.   PRECAUTIONS: Fall  WEIGHT BEARING RESTRICTIONS: No  FALLS:  Has patient fallen in last 6 months? Yes. Number of falls 3 with knot on head,   LIVING ENVIRONMENT: Lives with: lives alone Lives in: Townhouse 2-story with master bedroom & bathroom downstairs,  Stairs: Yes: Internal: 14-15 steps; on right going up and on left going up and External: 4 steps; can reach both Has following equipment at home: Single point cane, Walker - 4 wheeled, shower chair,  and Grab bars  OCCUPATION: retired  PLOF: Independent  PATIENT GOALS:  learn exercises to function better and with less pain.   Next MD visit:  OBJECTIVE:  DIAGNOSTIC FINDINGS: Radiographs show evidence of joint space narrowing, osteophytes, subchondral sclerosis and/or subchondral cysts  Patient-Specific Activity Scoring Scheme  "0" represents "unable to perform." "10" represents "able to perform at prior level. 0 1 2 3 4 5 6 7 8 9  10 (Date and Score)   Activity Eval     1. Walk around house  5    2. Stairs   5    3. Walking in community 3   4.    5.    Score 4.33    Total score = sum of the activity scores/number of activities Minimum detectable change (90%CI) for average score = 2 points Minimum detectable change (90%CI) for single activity score = 3 points  COGNITION: Overall cognitive status: WFL   POSTURE: rounded shoulders, forward head, and flexed trunk   PALPATION: Tenderness bilateral knees medially  LOWER EXTREMITY ROM:   ROM Right eval Left eval  Hip flexion    Hip extension Standing -11* Standing -12*  Hip abduction    Hip adduction    Hip internal rotation    Hip external rotation    Knee flexion  seated A: 104* Seated A: 115*  Knee extension Seated LAQ: -8* Seated LAQ -9*  Ankle dorsiflexion    Ankle plantarflexion    Ankle inversion    Ankle eversion     (Blank rows = not tested)  LOWER EXTREMITY MMT:  MMT Right eval Left eval  Hip flexion    Hip extension    Hip abduction    Hip adduction    Hip internal rotation    Hip external rotation    Knee flexion seated 4/5 Seated 4/5  Knee extension 4/5 4/5  Ankle dorsiflexion 5/5 5/5  Ankle plantarflexion    Ankle inversion    Ankle eversion     (Blank rows = not tested)  FUNCTIONAL TESTS:  18 inch chair transfer: Requires use of single upper extremity on armrest to arise from an 18 inch chair Timed Up and Go with single-point cane: Standard 19.59 seconds and cognitive 30.40 seconds (55% increase) with need to stop naming with turn and limited ability to restart.  Both test indicate high risk of falls especially when distracted.  GAIT: Distance walked: 100' Assistive device utilized: Single point cane Level of assistance: SBA Comments: Patient has forward trunk lean, hip and knee flexion in stance, poor clearance in swing and decreased arm swing.   TODAY'S TREATMENT                                                                          DATE: 05/23/23:  TherEx:  Nustep: level 5 x 10 minutes UE/LE Seated SLR: 2 x 10  Supine clam shells 2 x 10 green TB  Bridges: 2 x 10  Piriformis stretch pushing hip away: x 3 on bil LE holding 20 sec Side lying hip abd: 2 x 10 c tactile cues to prevent hips rotation backward and pt compensating with her quads TherActivities: Sit to stand:  2 x 10  05/16/2023 Therapeutic Exercise: Hip flexor stretch hooklying LE over edge 30 sec hold 3 reps per LE.  Bridge 10 reps 2 sets 5 sec hold Supine clams green X 15 reps Supine bridges 5 sec hold X 10 Mini squats at counter top X 10 reps Seated LAQ alternating LEs for 2 min Seated hamstring stretch with strap 30 sec X  bilat Nu step L5 X 8 min UE/LE   05/11/2023 Therapeutic Exercise: Hip flexor stretch hooklying LE over edge 30 sec hold 3 reps per LE.  PT demo & verbal cues how hip flexor tightness may effect her knees. Bridge 10 reps 2 sets 5 sec hold Squat over chair 5 sec hold 10 reps 2 sets added pillow squeeze between LEs to 2nd set with pt reporting less knee pain. Seated LAQ alternating LEs for 10 reps Seated hamstring stretch using her cane for DF 30 sec 2 reps per LE PT updated HEP with HO, demo & verbal cues.  Pt verbalized understanding after completing during session.    Self-Care: PT instructed pt in using pillows to "tent" sheets off feet to help decrease ankle position and resistance to turning over.  Pt verbalized understanding.      PATIENT EDUCATION:  Education details: HEP, POC Person educated: Patient Education method: Programmer, multimedia, Demonstration, Verbal cues, and Handouts Education comprehension: verbalized understanding, returned demonstration, and verbal cues required  HOME EXERCISE PROGRAM: Access Code: [email or web address] URL: [email or web address] Date: 05/11/2023 Prepared by: Vladimir Faster  Exercises - Seated Long Arc Quad  - 1 x daily - 7 x weekly - 2 sets - 10 reps - 5 seconds hold - Seated Hamstring Stretch with Strap  - 1 x daily - 7 x weekly - 1 sets - 3 reps - 30 seconds hold - sit to stand to sit with counter  - 1 x daily - 7 x weekly - 2 sets - 10 reps - 5 seconds hold - squat at sink with chair behind you  - 1 x daily - 7 x weekly - 1 sets - 10 reps - 5 seconds hold - Modified [name] Stretch  - 1 x daily - 7 x weekly - 1 sets - 3 reps - 30 seconds hold - Supine Bridge  - 1 x daily - 7 x weekly - 2 sets - 10 reps - 5 seconds hold  ASSESSMENT: CLINICAL IMPRESSION: Treatment again focusing on LE strengthening. Pt with limited hip ER on bil LE's. Continued skilled PT toward goals set.   OBJECTIVE IMPAIRMENTS: Abnormal gait, decreased activity tolerance,  decreased balance, decreased coordination, decreased endurance, decreased knowledge of condition, decreased knowledge of use of DME, decreased mobility, decreased ROM, decreased strength, dizziness, impaired flexibility, postural dysfunction, and pain.   ACTIVITY LIMITATIONS: carrying, lifting, standing, stairs, transfers, and locomotion level  PARTICIPATION LIMITATIONS: meal prep, cleaning, and community activity  PERSONAL FACTORS: Age, Fitness, Past/current experiences, and 3+ comorbidities: see PMH  are also affecting patient's functional outcome.   REHAB POTENTIAL: Good  CLINICAL DECISION MAKING: Evolving/moderate complexity  EVALUATION COMPLEXITY: Moderate   GOALS: Goals reviewed with patient? Yes  SHORT TERM GOALS: (target date for Short term goals 06/03/2023)   1.  Patient will demonstrate independent use of home exercise program to maintain progress from in clinic treatments. Baseline: See objective data Goal status: MET 05/23/23  2. Timed Up & Go <15 sec with cane Baseline: See objective data Goal status: Ongoing  05/11/2023    LONG TERM GOALS: (target dates for all long term goals  07/01/2023 )  1. Patient will demonstrate/report bilateral pain at worst less than or equal to 4/10 to facilitate minimal limitation in daily activity secondary to pain symptoms. Baseline: See objective data Goal status: Ongoing  05/11/2023   2. Patient will demonstrate independent use of home exercise program to facilitate ability to maintain/progress functional gains from skilled physical therapy services. Baseline: See objective data Goal status: Ongoing  05/11/2023  3.  Patient reports Patient-Specific Activity Score improved the average by 3 to indicate improvement in functional activities.  Baseline: SEE OBJECTIVE DATA Goal status: Ongoing  05/11/2023   4.  Timed Up & Go cognitive <20 sec. Baseline: See objective data Goal status: Ongoing  05/11/2023   5.  Patient reports no fall in  last 2 weeks.  Baseline: See objective data Goal status: Ongoing  05/11/2023   PLAN:  PT FREQUENCY:  2x/week  PT DURATION: 8 weeks  PLANNED INTERVENTIONS: 97164- PT Re-evaluation, 97110-Therapeutic exercises, 97530- Therapeutic activity, 97112- Neuromuscular re-education, 97535- Self Care, 78469- Manual therapy, [phone]- Gait training, [phone]- Canalith repositioning, Patient/Family education, Balance training, Stair training, Taping, Dry Needling, Joint mobilization, Vestibular training, DME instructions, and Physical Performance Testing  PLAN FOR NEXT SESSION: Continue to work on range and strength for hips, knees.    Sharmon Leyden, PT, MPT 05/23/2023, 2:32 PM

## 2023-05-24 ENCOUNTER — Ambulatory Visit: Payer: Medicare Other | Admitting: Neurology

## 2023-05-25 ENCOUNTER — Ambulatory Visit (INDEPENDENT_AMBULATORY_CARE_PROVIDER_SITE_OTHER): Admitting: Physical Therapy

## 2023-05-25 ENCOUNTER — Encounter: Payer: Self-pay | Admitting: Physical Therapy

## 2023-05-25 DIAGNOSIS — R293 Abnormal posture: Secondary | ICD-10-CM

## 2023-05-25 DIAGNOSIS — R2681 Unsteadiness on feet: Secondary | ICD-10-CM | POA: Diagnosis not present

## 2023-05-25 DIAGNOSIS — M6281 Muscle weakness (generalized): Secondary | ICD-10-CM

## 2023-05-25 DIAGNOSIS — Z23 Encounter for immunization: Secondary | ICD-10-CM | POA: Diagnosis not present

## 2023-05-25 DIAGNOSIS — R2689 Other abnormalities of gait and mobility: Secondary | ICD-10-CM

## 2023-05-25 DIAGNOSIS — M25561 Pain in right knee: Secondary | ICD-10-CM

## 2023-05-25 DIAGNOSIS — Z9181 History of falling: Secondary | ICD-10-CM

## 2023-05-25 DIAGNOSIS — M25562 Pain in left knee: Secondary | ICD-10-CM

## 2023-05-25 DIAGNOSIS — G8929 Other chronic pain: Secondary | ICD-10-CM | POA: Diagnosis not present

## 2023-05-25 NOTE — Progress Notes (Unsigned)
 Assessment/Plan:   1.  Tremor  -Patient previously saw Dr. Marjory Lies and Dr. Rubin Payor             -Patient with longstanding history of essential tremor             -Patient's skin biopsy was negative for alpha-synuclein             -Patient had [facility]scan at [facility] in February, 2020 and it stated that radiotracer uptake was mildly decreased bilaterally, but felt to be 2 standard deviations within age-matched controls.             -Records indicate that Dr. Rubin Payor did on/off testing in February, 2020 and felt that levodopa helped tremor but did not help bradykinesia (UPDRS off score was 40 and on score was still 31).  DBS was offered and declined at that time because patient's husband was ill.  -Levodopa was stopped in August, 2024.  The patient had some falls in January, 2025 and family wondered if related to stopping levodopa, but the falls really seemed to be falls that were truly reasonable; 1 was on uneven ground while taking out the trash and 1 occurred while tripping over a rug at a front door.  My suspicion is that the patient has significant enough essential tremor that it has affected the cerebellar climbing fibers, which can affect balance.  Nonetheless, I told the patient and her family that we can certainly retry levodopa if they want.  Her son today does not disagree and wonders if the falls were just because she was not using her cane with either fall, and she was on an uneven surface.  He also related that she was getting older.  Regardless, they decided to hold on reinitiating levodopa.  -We decided to continue primidone slowly 50 mg, 3/3/2  -Patient and her family are looking into changing her living situation, into a retired Advertising copywriter, which I agree with.  -I discussed surgical interventions today and I told patient that I would really not recommend them for her today.  I worry that these would make her balance worse.  I surely recognize that tremor is not fully  controlled, but I worry that surgery would worsen her condition and make her unable to live on her own.  While frustrated with tremor, she does not disagree.   2.  Spasmodic dysphonia             -hasn't done botox for a long time.  Has done it at [facility] in the past   3.  Hemifacial spasm, left             -Not bothersome to patient.  She has a little bit of blepharospasm as well  Subjective:   April Church was seen for tremor and gait instability.  She remains on the primidone.  Last visit, we had a long discussion about her long-term living situation and whether or not she could continue living at home alone on the long-term. She is looking for a retirement community. She has been in physical therapy since our last visit.  Those notes are reviewed.  She has had no further falls.  She is using the walker.  Current prescribed movement disorder medications: primidone 50 mg, 3/3/2  On low dose metoprolol (only 12.5 mg daily)   PREVIOUS MEDICATIONS:  primidone, up to 250 mg twice per day; carbidopa/levodopa ; entacapone   ALLERGIES:   Allergies  Allergen Reactions  Shellfish Allergy Shortness Of Breath   Duloxetine Itching and Rash   Sulfa Drugs Cross Reactors Anxiety and Rash   Lisinopril     Other reaction(s): cough Other reaction(s): cough Other reaction(s): cough   Lyrica [Pregabalin] Other (See Comments)    "made me drunk, couldn't function"   Other     Other reaction(s): hyper   Sulfa Antibiotics Other (See Comments)    Other reaction(s): hyper, break out in a rash Other reaction(s): hyper, break out in a rash   Codeine Anxiety    Other reaction(s): hyper Other reaction(s): hyper    CURRENT MEDICATIONS:  Current Meds  Medication Sig   albuterol (VENTOLIN HFA) 108 (90 Base) MCG/ACT inhaler Inhale 1-2 puffs into the lungs every 6 (six) hours as needed for wheezing or shortness of breath.   amLODipine (NORVASC) 2.5 MG tablet Take one tablet each day.    atorvastatin (LIPITOR) 20 MG tablet Take 20 mg by mouth daily.   budesonide-formoterol (SYMBICORT) 160-4.5 MCG/ACT inhaler Inhale 2 puffs into the lungs as needed.   CALCIUM CARBONATE PO Take 1 tablet by mouth 2 (two) times daily.   Cholecalciferol (VITAMIN D) 1000 UNITS capsule Take 1,000 Units by mouth 2 (two) times daily.   cyclobenzaprine (FLEXERIL) 5 MG tablet TAKE 1 TO UP TO 2 TABLETS BY MOUTH ONCE DAILY AT BEDTIME AS NEEDED FOR MUSCLE SPASM   EPINEPHrine 0.3 mg/0.3 mL IJ SOAJ injection Inject 0.3 mg into the muscle as needed.   famotidine (PEPCID) 20 MG tablet Take 1 tablet (20 mg total) by mouth daily.   hydrocortisone (ANUSOL-HC) 2.5 % rectal cream Place 1 Application rectally 2 (two) times daily.   ipratropium (ATROVENT) 0.06 % nasal spray Place 2 sprays into both nostrils 2 (two) times daily.   ketotifen (ZADITOR) 0.025 % ophthalmic solution Apply 1 drop to eye daily.   levocetirizine (XYZAL) 5 MG tablet TAKE 1 TABLET BY MOUTH ONCE DAILY IN THE EVENING . APPOINTMENT REQUIRED FOR FUTURE REFILLS   metoprolol succinate (TOPROL-XL) 25 MG 24 hr tablet Take 12.5 mg by mouth daily. Take 1/2 Tablet In The Morning   Na Sulfate-K Sulfate-Mg Sulf 17.5-3.13-1.6 GM/177ML SOLN Use as directed; may use generic; goodrx card if insurance will not cover generic   nystatin ointment (MYCOSTATIN) Apply 1 Application topically 2 (two) times daily.   Polyethyl Glycol-Propyl Glycol (SYSTANE OP) Place 1 drop into both eyes 2 (two) times daily.   primidone (MYSOLINE) 50 MG tablet 3 in the AM, 3 at noon, 2 at night   traMADol (ULTRAM) 50 MG tablet TAKE 1 TABLET BY MOUTH EVERY 12 HOURS AS NEEDED   triamcinolone ointment (KENALOG) 0.1 % Apply 1 Application topically as needed.   venlafaxine (EFFEXOR) 75 MG tablet Take 1 tablet (75 mg total) by mouth daily.     Objective:   PHYSICAL EXAMINATION:    VITALS:   Vitals:   05/26/23 1417  BP: 128/80  Pulse: 71  SpO2: 96%  Weight: 122 lb 9.6 oz (55.6 kg)   Height: 5' (1.524 m)      GEN:  The patient appears stated age and is in NAD. HEENT:  Normocephalic, AT.  The mucous membranes are moist. The superficial temporal arteries are without ropiness or tenderness. CV:  RRR Lungs:  CTAB Neck/HEME:  There are no carotid bruits bilaterally.  Neurological examination:  Orientation: The patient is alert and oriented x3.  She is able to provide her history very well Cranial nerves: There is good facial symmetry (with  L pseudoptosis since her fall) without facial hypomimia. The speech is fluent and clear.  She has baseline spasmodic dysphonia.  Soft palate rises symmetrically and there is no tongue deviation. Hearing is intact to conversational tone. Sensation: Sensation is intact to light touch throughout Motor: Strength is at least antigravity x4.  Movement examination: Tone: There is nl tone in the bilateral upper extremities.  The tone in the lower extremities is nl.  Abnormal movements: There is mild left hemifacial spasm.  There is no rest tremor today.  She has tremor with archimedes spirals  .   Coordination: Rapid alternating movements are good today. Gait and Station: The patient pushes off to arise.  The patient's stride length is decreased but no shuffling.  She ambulates with 3 wheeled walker.  I have reviewed and interpreted the following labs independently    Chemistry      Component Value Date/Time   NA 142 07/30/2020 1141   NA 136 01/20/2015 1326   K 4.5 07/30/2020 1141   K 4.1 01/20/2015 1326   CL 100 07/30/2020 1141   CL 102 06/30/2012 1231   CO2 25 07/30/2020 1141   CO2 27 01/20/2015 1326   BUN 11 07/30/2020 1141   BUN 13.9 01/20/2015 1326   CREATININE 0.65 07/30/2020 1141   CREATININE 0.9 01/20/2015 1326      Component Value Date/Time   CALCIUM 9.1 07/30/2020 1141   CALCIUM 8.8 01/20/2015 1326   ALKPHOS 86 01/20/2015 1326   AST 17 01/20/2015 1326   ALT 13 01/20/2015 1326   BILITOT <0.30 01/20/2015 1326        Lab Results  Component Value Date   WBC 5.6 09/13/2022   HGB 12.9 09/13/2022   HCT 39.7 09/13/2022   MCV 93.0 09/13/2022   PLT 316.0 09/13/2022    No results found for: "TSH"   Total time spent on today's visit was 30 minutes, including both face-to-face time and nonface-to-face time.  Time included that spent on review of records (prior notes available to me/labs/imaging if pertinent), discussing treatment and goals, answering patient's questions and coordinating care.  Cc:  Lorenda Ishihara, MD

## 2023-05-25 NOTE — Therapy (Signed)
 OUTPATIENT PHYSICAL THERAPY LOWER EXTREMITY TREATMENT   Patient Name: April Church MRN: [id number] DOB:Oct 29, 1940, 83 y.o., female Today's Date: 05/25/2023  END OF SESSION:  PT End of Session - 05/25/23 1416     Visit Number 5    Number of Visits 16    Date for PT Re-Evaluation 07/01/23    Authorization Type Medicare & BCBS supplement    Progress Note Due on Visit 10    PT Start Time 1416    PT Stop Time 1514    PT Time Calculation (min) 58 min    Activity Tolerance Patient tolerated treatment well    Behavior During Therapy [facility] for tasks assessed/performed                Past Medical History:  Diagnosis Date   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 2011- no further tx. now- Dr. Donnie Coffin , Dr. Dayton Scrape   Cyst of finger 11/2011   annular cyst right long finger   Dental crowns present    Dermatitis    Frequency of urination    GERD (gastroesophageal reflux disease)    Hemorrhoid    History of colon polyps 2009   Also noted 2012 and 2017 by colonoscopy.   History of shingles 1971   Had right ischial recurrence in the March 2019   Hyperlipidemia    On atorvastatin   Hypertension    under control, has been on med. x 2 yrs.   Liposarcoma of left shoulder (HCC) 1969   Parkinson's disease (HCC)    With mild tremor (neurologist Dr. Marjory Lies), neurosurgeon Dr. Lovell Sheehan   PONV (postoperative nausea and vomiting)    Seasonal allergies    Shingles of eyelid 1971   Right eyelid; recurrent -> last episode 05/11/2017   Tremors of nervous system    hands-essential tremor; associated with Parkinson's.   Trigger finger of right hand 11/2011   long finger   Past Surgical History:  Procedure Laterality Date   14-day Zio Patch Monitor  08/2020   (report to be scanned): Predominant SR w/ HR 61 to 142 bpm and average 86 bpm.   Total of 59 episodes of PAT/PSVT  4 to 15 beats (not noted on diary).  Fastest was 5 beats at a rate of 193 bpm, longest was 15 beats at a rate of 106 bpm.  Rare isolated PACs (as well as couplets and triplets) with rare isolated PVCs.  No sustained arrhythmias or bradycardia to explain syncope.   ANTERIOR CERVICAL DECOMPRESSION/DISCECTOMY FUSION 4 LEVELS N/A 11/14/2013   Procedure: ANTERIOR CERVICAL DECOMPRESSION/DISCECTOMY FUSION 4 LEVELS;  Surgeon: Tressie Stalker, MD;  Location: [facility];  Service: Neurosurgery;  Laterality: N/A;  C34 C45 C56 C67 anterior cervical fusion with interbody prosthesis plating and  bonegraft   APPENDECTOMY  age 73   BREAST LUMPECTOMY  06/16/2009   left; SLN bx.   BREAST LUMPECTOMY  07/01/2009   re-excision   BREAST SURGERY  02/22/1997   reduction   CATARACT EXTRACTION, BILATERAL     COLONOSCOPY WITH PROPOFOL N/A 12/03/2014   Procedure: COLONOSCOPY WITH PROPOFOL;  Surgeon: Charolett Bumpers, MD;  Location: [facility];  Service: Endoscopy;  Laterality: N/A;   FOOT SURGERY  06/22/2011   left   KNEE ARTHROSCOPY  03/12/2005   right   KNEE ARTHROSCOPY  left   Lower Extremity Venous Dopplers  12/12/2020   No DVT bilaterally in the deep veins or superficial veins.  No deep or superficial venous reflux noted bilaterally with exception of Right SSV at the knee.   NASAL SINUS SURGERY     x 2   NEPHRECTOMY LIVING DONOR Left 04/22/2008   donated to spouse 2010([facility])   TRANSTHORACIC ECHOCARDIOGRAM  12/10/2020   EF 60 to 65%.  Normal LV size and function.  No heart WMA.  GR 1 DD.  Normal RV, RVP and RAP.[name]  Normal valves.   TRIGGER FINGER RELEASE  12/16/2011   Procedure: RELEASE TRIGGER FINGER/A-1 PULLEY;  Surgeon: Tami Ribas, MD;  Location: [facility];  Service: Orthopedics;  Laterality: Right;  RIGHT LONG FINGER TRIGGER RELEASE & ANNULAR CYST EXCISION   TUMOR EXCISION  age 30   right arm   Patient Active Problem List   Diagnosis Date  Noted   Rectal bleeding 07/27/2022   Hemorrhoids 07/27/2022   Chronic pain of both knees 06/21/2022   Moderate persistent asthma without complication 03/27/2021   PSVT (paroxysmal supraventricular tachycardia) (HCC) 11/26/2020   Syncope and collapse 11/24/2020   Primary osteoarthritis of right knee 05/12/2020   Primary osteoarthritis of left knee 05/12/2020   Hyperlipidemia    Bilateral calf pain 03/05/2019   Genetic testing 02/13/2019   Family history of ovarian cancer    Family history of bladder cancer    Family history of colon cancer    Family history of leukemia    Lumbar radiculopathy 12/08/2018   Myofascial pain 12/08/2018   Impaired gait and mobility 12/08/2018   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   Chronic nonallergic rhinitis 12/29/2016   Mild persistent asthma, uncomplicated 12/29/2016   Vocal fold paralysis, 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   Gastroesophageal reflux disease 12/29/2016   Cervical spondylosis with myelopathy and radiculopathy 11/14/2013   Essential tremor 06/13/2013   Cervical spondylosis without myelopathy 06/13/2013   Breast cancer of lower-outer quadrant of left female breast (HCC) 09/10/2010    PCP: Lorenda Ishihara, MD  REFERRING PROVIDER: Madelyn Brunner, MD  REFERRING DIAG: M17.0 (ICD-10-CM) - Bilateral primary osteoarthritis of knee   THERAPY DIAG:  Unsteadiness on feet  Abnormal posture  Chronic pain of both knees  Other abnormalities of gait and mobility  History of falling  Muscle weakness (generalized)  Rationale for Evaluation and Treatment: Rehabilitation  ONSET DATE: 04/28/2023 MD referral to PT  SUBJECTIVE:   SUBJECTIVE STATEMENT: No falls.  She has been doing her exercises.     PERTINENT HISTORY: Cervical Decompression surgery 2015, Supraventicular tachycardia, syncope, OA, breast CA, Donated right kidney to her husband, HTN, Parkinson's Disease with slight tremor,   PAIN:  NPRS scale: 0/10,  in last week 5/10 Pain  location: knees RLE>LLE more medially Pain description: bad ache Aggravating factors: walking,  standing more than few minutes Relieving factors: injections,  tylenol takes edge off,  sleeping with pillow bw knees.   PRECAUTIONS: Fall  WEIGHT BEARING RESTRICTIONS: No  FALLS:  Has patient fallen in last 6 months? Yes. Number of falls 3 with knot on head,   LIVING ENVIRONMENT: Lives with: lives alone Lives in: Townhouse 2-story with master bedroom & bathroom downstairs,  Stairs: Yes: Internal: 14-15 steps; on right going up and on left going up and External: 4 steps; can reach both Has following equipment at home: Single point cane, Walker - 4 wheeled, shower chair, and Grab bars  OCCUPATION: retired  PLOF: Independent  PATIENT GOALS:  learn exercises to function better and with less pain.   Next MD visit:  OBJECTIVE:  DIAGNOSTIC FINDINGS: Radiographs show evidence of joint space narrowing, osteophytes, subchondral sclerosis and/or subchondral cysts  Patient-Specific Activity Scoring Scheme  "0" represents "unable to perform." "10" represents "able to perform at prior level. 0 1 2 3 4 5 6 7 8 9  10 (Date and Score)   Activity Eval     1. Walk around house  5    2. Stairs   5    3. Walking in community 3   4.    5.    Score 4.33    Total score = sum of the activity scores/number of activities Minimum detectable change (90%CI) for average score = 2 points Minimum detectable change (90%CI) for single activity score = 3 points  COGNITION: Overall cognitive status: WFL   POSTURE: rounded shoulders, forward head, and flexed trunk   PALPATION: Tenderness bilateral knees medially  LOWER EXTREMITY ROM:   ROM Right eval Left eval Right 05/25/23 Left 05/25/23  Hip flexion      Hip extension Standing -11* Standing -12*    Hip abduction      Hip adduction      Hip internal rotation      Hip external rotation      Knee flexion seated A: 104* Seated A: 115*    Knee  extension Seated LAQ: -8* Seated LAQ -9* Seated LAQ -4 Seated LAQ -6  Ankle dorsiflexion      Ankle plantarflexion      Ankle inversion      Ankle eversion       (Blank rows = not tested)  LOWER EXTREMITY MMT:  MMT Right eval Left eval  Hip flexion    Hip extension    Hip abduction    Hip adduction    Hip internal rotation    Hip external rotation    Knee flexion seated 4/5 Seated 4/5  Knee extension 4/5 4/5  Ankle dorsiflexion 5/5 5/5  Ankle plantarflexion    Ankle inversion    Ankle eversion     (Blank rows = not tested)  FUNCTIONAL TESTS:  18 inch chair transfer: Requires use of single upper extremity on armrest to arise from an 18 inch chair Timed Up and Go with single-point cane: Standard 19.59 seconds and cognitive 30.40 seconds (55% increase) with need to stop naming with turn and limited ability to restart.  Both test indicate high risk of falls especially when distracted.  GAIT: Distance walked: 100' Assistive device utilized: Single point cane Level of assistance: SBA Comments: Patient has forward trunk lean, hip and knee flexion in stance, poor clearance in swing and decreased arm swing.   TODAY'S TREATMENT                                                                          DATE: 05/25/2023:  Therapeutic Exercise:  Nustep seat 6 level 7 with BLEs & BUEs for 4 min, then level 5 BLEs only for 4 min Standing with counter support hip ext alternating LEs 10 reps Gastroc stretch BLEs on incline board 30 sec hold 3 reps and heel raises on incline board 10 reps 2 sets during  rests. Hamstring stretch with single long leg sit at edge of bed using cane to dorsiflex ankle 30 second hold 3 reps with each lower extremity.  PT manually assisting need to stay extended. Hip flexor stretch [name] position with PT assisting to keep knee-to-chest 30 seconds hold 3 reps with each lower extremity Standing TKE green Thera-Band 10 reps bilateral lower extremities with PT cues  on upright posture.  Therapeutic Activities: PT worked on patient's ability to initiate gait with less hesitancy.  With counter support close for safety.  2 reps with cane and 2 reps without device PT demo & verbal cues on using 24" bar stool for ADLs like kitchen that she would typically do standing and sit/stand from bar stool.  Pt able to return demo understanding including sitting down holding stool seat for safety and no UE standing up.      TREATMENT                                                                          DATE: 05/23/23:  TherEx:  Nustep: level 5 x 10 minutes UE/LE Seated SLR: 2 x 10  Supine clam shells 2 x 10 green TB  Bridges: 2 x 10  Piriformis stretch pushing hip away: x 3 on bil LE holding 20 sec Side lying hip abd: 2 x 10 c tactile cues to prevent hips rotation backward and pt compensating with her quads TherActivities: Sit to stand:  2 x 10       05/16/2023 Therapeutic Exercise: Hip flexor stretch hooklying LE over edge 30 sec hold 3 reps per LE.  Bridge 10 reps 2 sets 5 sec hold Supine clams green X 15 reps Supine bridges 5 sec hold X 10 Mini squats at counter top X 10 reps Seated LAQ alternating LEs for 2 min Seated hamstring stretch with strap 30 sec X bilat Nu step L5 X 8 min UE/LE    PATIENT EDUCATION:  Education details: HEP, POC Person educated: Patient Education method: Programmer, multimedia, Facilities manager, Verbal cues, and Handouts Education comprehension: verbalized understanding, returned demonstration, and verbal cues required  HOME EXERCISE PROGRAM: Access Code: [email or web address] URL: [email or web address] Date: 05/11/2023 Prepared by: Vladimir Faster  Exercises - Seated Long Arc Quad  - 1 x daily - 7 x weekly - 2 sets - 10 reps - 5 seconds hold - Seated Hamstring Stretch with Strap  - 1 x daily - 7 x weekly - 1 sets - 3 reps - 30 seconds hold - sit to stand to sit with counter  - 1 x daily - 7 x weekly - 2 sets - 10 reps - 5 seconds  hold - squat at sink with chair behind you  - 1 x daily - 7 x weekly - 1 sets - 10 reps - 5 seconds hold - Modified [name] Stretch  - 1 x daily - 7 x weekly - 1 sets - 3 reps - 30 seconds hold - Supine Bridge  - 1 x daily - 7 x weekly - 2 sets - 10 reps - 5 seconds hold  ASSESSMENT: CLINICAL IMPRESSION: PT worked on bilateral lower extremity flexibility to improve her upright posture and ability to stand without hip and knee flexion.  Patient appears  to be slowly improving her mobility.  Patient appears to understand PT recommendation for using a barstool for ADLs.  Continued skilled PT toward goals set.   OBJECTIVE IMPAIRMENTS: Abnormal gait, decreased activity tolerance, decreased balance, decreased coordination, decreased endurance, decreased knowledge of condition, decreased knowledge of use of DME, decreased mobility, decreased ROM, decreased strength, dizziness, impaired flexibility, postural dysfunction, and pain.   ACTIVITY LIMITATIONS: carrying, lifting, standing, stairs, transfers, and locomotion level  PARTICIPATION LIMITATIONS: meal prep, cleaning, and community activity  PERSONAL FACTORS: Age, Fitness, Past/current experiences, and 3+ comorbidities: see PMH  are also affecting patient's functional outcome.   REHAB POTENTIAL: Good  CLINICAL DECISION MAKING: Evolving/moderate complexity  EVALUATION COMPLEXITY: Moderate   GOALS: Goals reviewed with patient? Yes  SHORT TERM GOALS: (target date for Short term goals 06/03/2023)   1.  Patient will demonstrate independent use of home exercise program to maintain progress from in clinic treatments. Baseline: See objective data Goal status: MET 05/23/23  2. Timed Up & Go <15 sec with cane Baseline: See objective data Goal status: Ongoing  05/25/2023    LONG TERM GOALS: (target dates for all long term goals  07/01/2023 )   1. Patient will demonstrate/report bilateral pain at worst less than or equal to 4/10 to facilitate minimal  limitation in daily activity secondary to pain symptoms. Baseline: See objective data Goal status: Ongoing  05/25/2023   2. Patient will demonstrate independent use of home exercise program to facilitate ability to maintain/progress functional gains from skilled physical therapy services. Baseline: See objective data Goal status: Ongoing 05/25/2023  3.  Patient reports Patient-Specific Activity Score improved the average by 3 to indicate improvement in functional activities.  Baseline: SEE OBJECTIVE DATA Goal status: Ongoing  4/2/20255   4.  Timed Up & Go cognitive <20 sec. Baseline: See objective data Goal status: Ongoing  05/25/2023   5.  Patient reports no fall in last 2 weeks.  Baseline: See objective data Goal status: Ongoing  05/25/2023   PLAN:  PT FREQUENCY:  2x/week  PT DURATION: 8 weeks  PLANNED INTERVENTIONS: 97164- PT Re-evaluation, 97110-Therapeutic exercises, 97530- Therapeutic activity, 97112- Neuromuscular re-education, 97535- Self Care, [id number]- Manual therapy, [phone]- Gait training, [phone]- Canalith repositioning, Patient/Family education, Balance training, Stair training, Taping, Dry Needling, Joint mobilization, Vestibular training, DME instructions, and Physical Performance Testing  PLAN FOR NEXT SESSION: Check STG's, functional strengthening and therapeutic activities to improve standing balance.   Continue to work on range and strength for hips, knees.    Vladimir Faster, PT, DPT 05/25/2023, 4:56 PM

## 2023-05-26 ENCOUNTER — Encounter: Payer: Self-pay | Admitting: Neurology

## 2023-05-26 ENCOUNTER — Ambulatory Visit (INDEPENDENT_AMBULATORY_CARE_PROVIDER_SITE_OTHER): Payer: Medicare Other | Admitting: Neurology

## 2023-05-26 VITALS — BP 128/80 | HR 71 | Ht 60.0 in | Wt 122.6 lb

## 2023-05-26 DIAGNOSIS — G250 Essential tremor: Secondary | ICD-10-CM

## 2023-05-26 DIAGNOSIS — J383 Other diseases of vocal cords: Secondary | ICD-10-CM | POA: Diagnosis not present

## 2023-05-26 MED ORDER — PRIMIDONE 50 MG PO TABS: ORAL_TABLET | ORAL | 1 refills | Status: DC

## 2023-05-30 ENCOUNTER — Ambulatory Visit (INDEPENDENT_AMBULATORY_CARE_PROVIDER_SITE_OTHER): Admitting: Physical Therapy

## 2023-05-30 ENCOUNTER — Encounter: Payer: Self-pay | Admitting: Physical Therapy

## 2023-05-30 DIAGNOSIS — R2689 Other abnormalities of gait and mobility: Secondary | ICD-10-CM

## 2023-05-30 DIAGNOSIS — G8929 Other chronic pain: Secondary | ICD-10-CM

## 2023-05-30 DIAGNOSIS — M25561 Pain in right knee: Secondary | ICD-10-CM

## 2023-05-30 DIAGNOSIS — R293 Abnormal posture: Secondary | ICD-10-CM | POA: Diagnosis not present

## 2023-05-30 DIAGNOSIS — M25562 Pain in left knee: Secondary | ICD-10-CM

## 2023-05-30 DIAGNOSIS — R2681 Unsteadiness on feet: Secondary | ICD-10-CM

## 2023-05-30 NOTE — Therapy (Signed)
 OUTPATIENT PHYSICAL THERAPY LOWER EXTREMITY TREATMENT   Patient Name: April Church MRN: [id number] DOB:November 27, 1940, 83 y.o., female Today's Date: 05/30/2023  END OF SESSION:  PT End of Session - 05/30/23 1343     Visit Number 6    Number of Visits 16    Date for PT Re-Evaluation 07/01/23    Authorization Type Medicare & BCBS supplement    Progress Note Due on Visit 10    PT Start Time 1300    PT Stop Time 1344    PT Time Calculation (min) 44 min    Activity Tolerance Patient tolerated treatment well    Behavior During Therapy [facility] for tasks assessed/performed                 Past Medical History:  Diagnosis Date   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 2011- no further tx. now- Dr. Donnie Coffin , Dr. Dayton Scrape   Cyst of finger 11/2011   annular cyst right long finger   Dental crowns present    Dermatitis    Frequency of urination    GERD (gastroesophageal reflux disease)    Hemorrhoid    History of colon polyps 2009   Also noted 2012 and 2017 by colonoscopy.   History of shingles 1971   Had right ischial recurrence in the March 2019   Hyperlipidemia    On atorvastatin   Hypertension    under control, has been on med. x 2 yrs.   Liposarcoma of left shoulder (HCC) 1969   Parkinson's disease (HCC)    With mild tremor (neurologist Dr. Marjory Lies), neurosurgeon Dr. Lovell Sheehan   PONV (postoperative nausea and vomiting)    Seasonal allergies    Shingles of eyelid 1971   Right eyelid; recurrent -> last episode 05/11/2017   Tremors of nervous system    hands-essential tremor; associated with Parkinson's.   Trigger finger of right hand 11/2011   long finger   Past Surgical History:  Procedure Laterality Date   14-day Zio Patch Monitor  08/2020   (report to be scanned): Predominant SR w/ HR 61 to 142 bpm and average 86 bpm.   Total of 59 episodes of PAT/PSVT  4 to 15 beats (not noted on diary).  Fastest was 5 beats at a rate of 193 bpm, longest was 15 beats at a rate of 106 bpm.  Rare isolated PACs (as well as couplets and triplets) with rare isolated PVCs.  No sustained arrhythmias or bradycardia to explain syncope.   ANTERIOR CERVICAL DECOMPRESSION/DISCECTOMY FUSION 4 LEVELS N/A 11/14/2013   Procedure: ANTERIOR CERVICAL DECOMPRESSION/DISCECTOMY FUSION 4 LEVELS;  Surgeon: Tressie Stalker, MD;  Location: [facility];  Service: Neurosurgery;  Laterality: N/A;  C34 C45 C56 C67 anterior cervical fusion with interbody prosthesis plating and  bonegraft   APPENDECTOMY  age 46   BREAST LUMPECTOMY  06/16/2009   left; SLN bx.   BREAST LUMPECTOMY  07/01/2009   re-excision   BREAST SURGERY  02/22/1997   reduction   CATARACT EXTRACTION, BILATERAL     COLONOSCOPY WITH PROPOFOL N/A 12/03/2014   Procedure: COLONOSCOPY WITH PROPOFOL;  Surgeon: Charolett Bumpers, MD;  Location: [facility];  Service: Endoscopy;  Laterality: N/A;   FOOT SURGERY  06/22/2011   left   KNEE ARTHROSCOPY  03/12/2005   right   KNEE ARTHROSCOPY  left   Lower Extremity Venous Dopplers  12/12/2020   No DVT bilaterally in the deep veins or superficial veins.  No deep or superficial venous reflux noted bilaterally with exception of Right SSV at the knee.   NASAL SINUS SURGERY     x 2   NEPHRECTOMY LIVING DONOR Left 04/22/2008   donated to spouse 2010([facility])   TRANSTHORACIC ECHOCARDIOGRAM  12/10/2020   EF 60 to 65%.  Normal LV size and function.  No heart WMA.  GR 1 DD.  Normal RV, RVP and RAP.[name]  Normal valves.   TRIGGER FINGER RELEASE  12/16/2011   Procedure: RELEASE TRIGGER FINGER/A-1 PULLEY;  Surgeon: Tami Ribas, MD;  Location: [facility];  Service: Orthopedics;  Laterality: Right;  RIGHT LONG FINGER TRIGGER RELEASE & ANNULAR CYST EXCISION   TUMOR EXCISION  age 76   right arm   Patient Active Problem List   Diagnosis Date  Noted   Rectal bleeding 07/27/2022   Hemorrhoids 07/27/2022   Chronic pain of both knees 06/21/2022   Moderate persistent asthma without complication 03/27/2021   PSVT (paroxysmal supraventricular tachycardia) (HCC) 11/26/2020   Syncope and collapse 11/24/2020   Primary osteoarthritis of right knee 05/12/2020   Primary osteoarthritis of left knee 05/12/2020   Hyperlipidemia    Bilateral calf pain 03/05/2019   Genetic testing 02/13/2019   Family history of ovarian cancer    Family history of bladder cancer    Family history of colon cancer    Family history of leukemia    Lumbar radiculopathy 12/08/2018   Myofascial pain 12/08/2018   Impaired gait and mobility 12/08/2018   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   Chronic nonallergic rhinitis 12/29/2016   Mild persistent asthma, uncomplicated 12/29/2016   Vocal fold paralysis, 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   Gastroesophageal reflux disease 12/29/2016   Cervical spondylosis with myelopathy and radiculopathy 11/14/2013   Essential tremor 06/13/2013   Cervical spondylosis without myelopathy 06/13/2013   Breast cancer of lower-outer quadrant of left female breast (HCC) 09/10/2010    PCP: Lorenda Ishihara, MD  REFERRING PROVIDER: Madelyn Brunner, MD  REFERRING DIAG: M17.0 (ICD-10-CM) - Bilateral primary osteoarthritis of knee   THERAPY DIAG:  Unsteadiness on feet  Abnormal posture  Chronic pain of both knees  Other abnormalities of gait and mobility  Rationale for Evaluation and Treatment: Rehabilitation  ONSET DATE: 04/28/2023 MD referral to PT  SUBJECTIVE:   SUBJECTIVE STATEMENT: States some Rt knee pain today     PERTINENT HISTORY: Cervical Decompression surgery 2015, Supraventicular tachycardia, syncope, OA, breast CA, Donated right kidney to her husband, HTN, Parkinson's Disease with slight tremor,   PAIN:  NPRS scale: 3/10 Pain location: knees RLE>LLE more medially Pain description: bad ache Aggravating factors: walking,   standing more than few minutes Relieving factors: injections,  tylenol takes edge off,  sleeping with pillow bw knees.   PRECAUTIONS: Fall  WEIGHT BEARING RESTRICTIONS: No  FALLS:  Has patient fallen in last 6 months? Yes. Number of falls 3 with knot on head,   LIVING ENVIRONMENT: Lives with: lives alone Lives in: Townhouse 2-story with master bedroom & bathroom downstairs,  Stairs: Yes: Internal: 14-15 steps; on right going up and on left going up and External: 4 steps; can reach both Has following equipment at home: Single point cane, Walker - 4 wheeled, shower chair, and Grab bars  OCCUPATION: retired  PLOF: Independent  PATIENT GOALS:  learn exercises to function better and with less pain.   Next MD visit:  OBJECTIVE:  DIAGNOSTIC FINDINGS: Radiographs show evidence of joint space narrowing, osteophytes, subchondral sclerosis and/or subchondral cysts  Patient-Specific Activity Scoring Scheme  "0" represents "unable to perform." "10" represents "able to perform at prior level. 0 1 2 3 4 5 6 7 8 9  10 (Date and Score)   Activity Eval     1. Walk around house  5    2. Stairs   5    3. Walking in community 3   4.    5.    Score 4.33    Total score = sum of the activity scores/number of activities Minimum detectable change (90%CI) for average score = 2 points Minimum detectable change (90%CI) for single activity score = 3 points  COGNITION: Overall cognitive status: WFL   POSTURE: rounded shoulders, forward head, and flexed trunk   PALPATION: Tenderness bilateral knees medially  LOWER EXTREMITY ROM:   ROM Right eval Left eval Right 05/25/23 Left 05/25/23  Hip flexion      Hip extension Standing -11* Standing -12*    Hip abduction      Hip adduction      Hip internal rotation      Hip external rotation      Knee flexion seated A: 104* Seated A: 115*    Knee extension Seated LAQ: -8* Seated LAQ -9* Seated LAQ -4 Seated LAQ -6  Ankle dorsiflexion       Ankle plantarflexion      Ankle inversion      Ankle eversion       (Blank rows = not tested)  LOWER EXTREMITY MMT:  MMT Right eval Left eval  Hip flexion    Hip extension    Hip abduction    Hip adduction    Hip internal rotation    Hip external rotation    Knee flexion seated 4/5 Seated 4/5  Knee extension 4/5 4/5  Ankle dorsiflexion 5/5 5/5  Ankle plantarflexion    Ankle inversion    Ankle eversion     (Blank rows = not tested)  FUNCTIONAL TESTS:  18 inch chair transfer: Requires use of single upper extremity on armrest to arise from an 18 inch chair Timed Up and Go with single-point cane: Standard 19.59 seconds and cognitive 30.40 seconds (55% increase) with need to stop naming with turn and limited ability to restart.  Both test indicate high risk of falls especially when distracted.  05/30/23: TUG time with cane 14:37 sec  TUG time without cane 12:88  GAIT: Distance walked: 100' Assistive device utilized: Single point cane Level of assistance: SBA Comments: Patient has forward trunk lean, hip and knee flexion in stance, poor clearance in swing and decreased arm swing.   TODAY'S TREATMENT                                                                          DATE: 05/30/2023:  Therapeutic Exercise:  Nustep seat 7 X 8 min UE/LE L6 Gastroc stretch BLEs on incline board 30 sec hold 3 reps and heel raises on incline board 10 reps 2 sets during rests.   Therapeutic Activities:(strength for stairs, transfers, gait) TUG time with cane 14:37 sec  TUG time without cane 12:88  Step ups 6  inch step with one UE support X 10 each leg Seated LAQ 3# 2X10 bilat Standing hip abductions 3# 2X10 bilat Standing hamstring curls 3# 2X10 bilat Leg press DL 16# 1W96  Neuromuscular re-ed Sidestepping without UE support 3 round trips at counter top March walking with one UE support 3 round trips at counter top Tandem balance 30 sec X 3 bilat with intermitt UE support  PRN   TREATMENT                                                                          DATE: 05/23/23:  TherEx:  Nustep: level 5 x 10 minutes UE/LE Seated SLR: 2 x 10  Supine clam shells 2 x 10 green TB  Bridges: 2 x 10  Piriformis stretch pushing hip away: x 3 on bil LE holding 20 sec Side lying hip abd: 2 x 10 c tactile cues to prevent hips rotation backward and pt compensating with her quads TherActivities: Sit to stand:  2 x 10       05/16/2023 Therapeutic Exercise: Hip flexor stretch hooklying LE over edge 30 sec hold 3 reps per LE.  Bridge 10 reps 2 sets 5 sec hold Supine clams green X 15 reps Supine bridges 5 sec hold X 10 Mini squats at counter top X 10 reps Seated LAQ alternating LEs for 2 min Seated hamstring stretch with strap 30 sec X bilat Nu step L5 X 8 min UE/LE    PATIENT EDUCATION:  Education details: HEP, POC Person educated: Patient Education method: Programmer, multimedia, Facilities manager, Verbal cues, and Handouts Education comprehension: verbalized understanding, returned demonstration, and verbal cues required  HOME EXERCISE PROGRAM: Access Code: [email or web address] URL: [email or web address] Date: 05/11/2023 Prepared by: Vladimir Faster  Exercises - Seated Long Arc Quad  - 1 x daily - 7 x weekly - 2 sets - 10 reps - 5 seconds hold - Seated Hamstring Stretch with Strap  - 1 x daily - 7 x weekly - 1 sets - 3 reps - 30 seconds hold - sit to stand to sit with counter  - 1 x daily - 7 x weekly - 2 sets - 10 reps - 5 seconds hold - squat at sink with chair behind you  - 1 x daily - 7 x weekly - 1 sets - 10 reps - 5 seconds hold - Modified [name] Stretch  - 1 x daily - 7 x weekly - 1 sets - 3 reps - 30 seconds hold - Supine Bridge  - 1 x daily - 7 x weekly - 2 sets - 10 reps - 5 seconds hold  ASSESSMENT: CLINICAL IMPRESSION: She has now met short term PT goals but not yet all of long term goals so skilled PT services still recommended at this time to work  toward her functional goals.   OBJECTIVE IMPAIRMENTS: Abnormal gait, decreased activity tolerance, decreased balance, decreased coordination, decreased endurance, decreased knowledge of condition, decreased knowledge of use of DME, decreased mobility, decreased ROM, decreased strength, dizziness, impaired flexibility, postural dysfunction, and pain.   ACTIVITY LIMITATIONS: carrying, lifting, standing, stairs, transfers, and locomotion level  PARTICIPATION LIMITATIONS: meal prep, cleaning, and community activity  PERSONAL FACTORS: Age, Fitness, Past/current experiences, and 3+ comorbidities: see PMH  are also affecting patient's functional outcome.   REHAB POTENTIAL: Good  CLINICAL DECISION MAKING: Evolving/moderate complexity  EVALUATION COMPLEXITY: Moderate   GOALS: Goals reviewed with patient? Yes  SHORT TERM GOALS: (target date for Short term goals 06/03/2023)   1.  Patient will demonstrate independent use of home exercise program to maintain progress from in clinic treatments. Baseline: See objective data Goal status: MET 05/23/23  2. Timed Up & Go <15 sec with cane Baseline: See objective data Goal status: MET 05/30/23    LONG TERM GOALS: (target dates for all long term goals  07/01/2023 )   1. Patient will demonstrate/report bilateral pain at worst less than or equal to 4/10 to facilitate minimal limitation in daily activity secondary to pain symptoms. Baseline: See objective data Goal status: Ongoing  05/25/2023   2. Patient will demonstrate independent use of home exercise program to facilitate ability to maintain/progress functional gains from skilled physical therapy services. Baseline: See objective data Goal status: Ongoing 05/25/2023  3.  Patient reports Patient-Specific Activity Score improved the average by 3 to indicate improvement in functional activities.  Baseline: SEE OBJECTIVE DATA Goal status: Ongoing  4/2/20255   4.  Timed Up & Go cognitive <20  sec. Baseline: See objective data Goal status: Ongoing  05/25/2023   5.  Patient reports no fall in last 2 weeks.  Baseline: See objective data Goal status: Ongoing  05/25/2023   PLAN:  PT FREQUENCY:  2x/week  PT DURATION: 8 weeks  PLANNED INTERVENTIONS: 97164- PT Re-evaluation, 97110-Therapeutic exercises, 97530- Therapeutic activity, 97112- Neuromuscular re-education, 97535- Self Care, 29528- Manual therapy, [phone]- Gait training, [phone]- Canalith repositioning, Patient/Family education, Balance training, Stair training, Taping, Dry Needling, Joint mobilization, Vestibular training, DME instructions, and Physical Performance Testing  PLAN FOR NEXT SESSION:, functional strengthening and therapeutic activities to improve standing balance.   Continue to work on range and strength for hips, knees.    April Manson, PT, DPT 05/30/2023, 1:44 PM

## 2023-06-01 ENCOUNTER — Ambulatory Visit (INDEPENDENT_AMBULATORY_CARE_PROVIDER_SITE_OTHER): Admitting: Physical Therapy

## 2023-06-01 ENCOUNTER — Encounter: Payer: Self-pay | Admitting: Physical Therapy

## 2023-06-01 DIAGNOSIS — G8929 Other chronic pain: Secondary | ICD-10-CM | POA: Diagnosis not present

## 2023-06-01 DIAGNOSIS — M25562 Pain in left knee: Secondary | ICD-10-CM | POA: Diagnosis not present

## 2023-06-01 DIAGNOSIS — R293 Abnormal posture: Secondary | ICD-10-CM

## 2023-06-01 DIAGNOSIS — R2681 Unsteadiness on feet: Secondary | ICD-10-CM

## 2023-06-01 DIAGNOSIS — M25561 Pain in right knee: Secondary | ICD-10-CM

## 2023-06-01 DIAGNOSIS — M6281 Muscle weakness (generalized): Secondary | ICD-10-CM | POA: Diagnosis not present

## 2023-06-01 NOTE — Therapy (Signed)
 OUTPATIENT PHYSICAL THERAPY LOWER EXTREMITY TREATMENT   Patient Name: April Church MRN: [id number] DOB:12-19-1940, 83 y.o., female Today's Date: 06/01/2023  END OF SESSION:  PT End of Session - 06/01/23 1050     Visit Number 7    Number of Visits 16    Date for PT Re-Evaluation 07/01/23    Authorization Type Medicare & BCBS supplement    Progress Note Due on Visit 10    PT Start Time 1015    PT Stop Time 1055    PT Time Calculation (min) 40 min    Activity Tolerance Patient tolerated treatment well    Behavior During Therapy [facility] for tasks assessed/performed                  Past Medical History:  Diagnosis Date   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 2011- no further tx. now- Dr. Donnie Coffin , Dr. Dayton Scrape   Cyst of finger 11/2011   annular cyst right long finger   Dental crowns present    Dermatitis    Frequency of urination    GERD (gastroesophageal reflux disease)    Hemorrhoid    History of colon polyps 2009   Also noted 2012 and 2017 by colonoscopy.   History of shingles 1971   Had right ischial recurrence in the March 2019   Hyperlipidemia    On atorvastatin   Hypertension    under control, has been on med. x 2 yrs.   Liposarcoma of left shoulder (HCC) 1969   Parkinson's disease (HCC)    With mild tremor (neurologist Dr. Marjory Lies), neurosurgeon Dr. Lovell Sheehan   PONV (postoperative nausea and vomiting)    Seasonal allergies    Shingles of eyelid 1971   Right eyelid; recurrent -> last episode 05/11/2017   Tremors of nervous system    hands-essential tremor; associated with Parkinson's.   Trigger finger of right hand 11/2011   long finger   Past Surgical History:  Procedure Laterality Date   14-day Zio Patch Monitor  08/2020   (report to be scanned): Predominant SR w/ HR 61 to 142 bpm and average 86 bpm.   Total of 59 episodes of PAT/PSVT  4 to 15 beats (not noted on diary).  Fastest was 5 beats at a rate of 193 bpm, longest was 15 beats at a rate of 106 bpm.  Rare isolated PACs (as well as couplets and triplets) with rare isolated PVCs.  No sustained arrhythmias or bradycardia to explain syncope.   ANTERIOR CERVICAL DECOMPRESSION/DISCECTOMY FUSION 4 LEVELS N/A 11/14/2013   Procedure: ANTERIOR CERVICAL DECOMPRESSION/DISCECTOMY FUSION 4 LEVELS;  Surgeon: Tressie Stalker, MD;  Location: [facility];  Service: Neurosurgery;  Laterality: N/A;  C34 C45 C56 C67 anterior cervical fusion with interbody prosthesis plating and  bonegraft   APPENDECTOMY  age 64   BREAST LUMPECTOMY  06/16/2009   left; SLN bx.   BREAST LUMPECTOMY  07/01/2009   re-excision   BREAST SURGERY  02/22/1997   reduction   CATARACT EXTRACTION, BILATERAL     COLONOSCOPY WITH PROPOFOL N/A 12/03/2014   Procedure: COLONOSCOPY WITH PROPOFOL;  Surgeon: Charolett Bumpers, MD;  Location: [facility];  Service: Endoscopy;  Laterality: N/A;   FOOT SURGERY  06/22/2011   left   KNEE ARTHROSCOPY  03/12/2005   right   KNEE ARTHROSCOPY  left   Lower Extremity Venous Dopplers  12/12/2020   No DVT bilaterally in the deep veins or superficial veins.  No deep or superficial venous reflux noted bilaterally with exception of Right SSV at the knee.   NASAL SINUS SURGERY     x 2   NEPHRECTOMY LIVING DONOR Left 04/22/2008   donated to spouse 2010([facility])   TRANSTHORACIC ECHOCARDIOGRAM  12/10/2020   EF 60 to 65%.  Normal LV size and function.  No heart WMA.  GR 1 DD.  Normal RV, RVP and RAP.[name]  Normal valves.   TRIGGER FINGER RELEASE  12/16/2011   Procedure: RELEASE TRIGGER FINGER/A-1 PULLEY;  Surgeon: Tami Ribas, MD;  Location: [facility];  Service: Orthopedics;  Laterality: Right;  RIGHT LONG FINGER TRIGGER RELEASE & ANNULAR CYST EXCISION   TUMOR EXCISION  age 56   right arm   Patient Active Problem List   Diagnosis Date  Noted   Rectal bleeding 07/27/2022   Hemorrhoids 07/27/2022   Chronic pain of both knees 06/21/2022   Moderate persistent asthma without complication 03/27/2021   PSVT (paroxysmal supraventricular tachycardia) (HCC) 11/26/2020   Syncope and collapse 11/24/2020   Primary osteoarthritis of right knee 05/12/2020   Primary osteoarthritis of left knee 05/12/2020   Hyperlipidemia    Bilateral calf pain 03/05/2019   Genetic testing 02/13/2019   Family history of ovarian cancer    Family history of bladder cancer    Family history of colon cancer    Family history of leukemia    Lumbar radiculopathy 12/08/2018   Myofascial pain 12/08/2018   Impaired gait and mobility 12/08/2018   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   Chronic nonallergic rhinitis 12/29/2016   Mild persistent asthma, uncomplicated 12/29/2016   Vocal fold paralysis, 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   Gastroesophageal reflux disease 12/29/2016   Cervical spondylosis with myelopathy and radiculopathy 11/14/2013   Essential tremor 06/13/2013   Cervical spondylosis without myelopathy 06/13/2013   Breast cancer of lower-outer quadrant of left female breast (HCC) 09/10/2010    PCP: Lorenda Ishihara, MD  REFERRING PROVIDER: Madelyn Brunner, MD  REFERRING DIAG: M17.0 (ICD-10-CM) - Bilateral primary osteoarthritis of knee   THERAPY DIAG:  Unsteadiness on feet  Abnormal posture  Chronic pain of both knees  Muscle weakness (generalized)  Rationale for Evaluation and Treatment: Rehabilitation  ONSET DATE: 04/28/2023 MD referral to PT  SUBJECTIVE:   SUBJECTIVE STATEMENT: States she is feeling good overall today.    PERTINENT HISTORY: Cervical Decompression surgery 2015, Supraventicular tachycardia, syncope, OA, breast CA, Donated right kidney to her husband, HTN, Parkinson's Disease with slight tremor,   PAIN:  NPRS scale: 3/10 Pain location: knees RLE>LLE more medially Pain description: bad ache Aggravating factors: walking,   standing more than few minutes Relieving factors: injections,  tylenol takes edge off,  sleeping with pillow bw knees.   PRECAUTIONS: Fall  WEIGHT BEARING RESTRICTIONS: No  FALLS:  Has patient fallen in last 6 months? Yes. Number of falls 3 with knot on head,   LIVING ENVIRONMENT: Lives with: lives alone Lives in: Townhouse 2-story with master bedroom & bathroom downstairs,  Stairs: Yes: Internal: 14-15 steps; on right going up and on left going up and External: 4 steps; can reach both Has following equipment at home: Single point cane, Walker - 4 wheeled, shower chair, and Grab bars  OCCUPATION: retired  PLOF: Independent  PATIENT GOALS:  learn exercises to function better and with less pain.   Next MD visit:  OBJECTIVE:  DIAGNOSTIC FINDINGS:  Radiographs show evidence of joint space narrowing, osteophytes, subchondral sclerosis and/or subchondral cysts  Patient-Specific Activity Scoring Scheme  "0" represents "unable to perform." "10" represents "able to perform at prior level. 0 1 2 3 4 5 6 7 8 9  10 (Date and Score)   Activity Eval     1. Walk around house  5    2. Stairs   5    3. Walking in community 3   4.    5.    Score 4.33    Total score = sum of the activity scores/number of activities Minimum detectable change (90%CI) for average score = 2 points Minimum detectable change (90%CI) for single activity score = 3 points  COGNITION: Overall cognitive status: WFL   POSTURE: rounded shoulders, forward head, and flexed trunk   PALPATION: Tenderness bilateral knees medially  LOWER EXTREMITY ROM:   ROM Right eval Left eval Right 05/25/23 Left 05/25/23  Hip flexion      Hip extension Standing -11* Standing -12*    Hip abduction      Hip adduction      Hip internal rotation      Hip external rotation      Knee flexion seated A: 104* Seated A: 115*    Knee extension Seated LAQ: -8* Seated LAQ -9* Seated LAQ -4 Seated LAQ -6  Ankle dorsiflexion       Ankle plantarflexion      Ankle inversion      Ankle eversion       (Blank rows = not tested)  LOWER EXTREMITY MMT:  MMT Right eval Left eval  Hip flexion    Hip extension    Hip abduction    Hip adduction    Hip internal rotation    Hip external rotation    Knee flexion seated 4/5 Seated 4/5  Knee extension 4/5 4/5  Ankle dorsiflexion 5/5 5/5  Ankle plantarflexion    Ankle inversion    Ankle eversion     (Blank rows = not tested)  FUNCTIONAL TESTS:  18 inch chair transfer: Requires use of single upper extremity on armrest to arise from an 18 inch chair Timed Up and Go with single-point cane: Standard 19.59 seconds and cognitive 30.40 seconds (55% increase) with need to stop naming with turn and limited ability to restart.  Both test indicate high risk of falls especially when distracted.  05/30/23: TUG time with cane 14:37 sec  TUG time without cane 12:88  GAIT: Distance walked: 100' Assistive device utilized: Single point cane Level of assistance: SBA Comments: Patient has forward trunk lean, hip and knee flexion in stance, poor clearance in swing and decreased arm swing.   TODAY'S TREATMENT                                                                          DATE: 06/01/2023:  Therapeutic Exercise:  Attempted recumbent bike not not enough knee ROM to do this without pain so discontinued Nustep seat 7 X 8  min UE/LE L6  Therapeutic Activities:(strength for stairs, transfers, gait) Sit to stand without UE support X 10 (did place hands in lap) Step ups 6 inch step with one 2 UE support X 10 each  leg Seated LAQ 3# 2X10 bilat Standing hip abductions red 2X10 bilat Standing hamstring curl red 2X10 bilat Leg press DL 81# 1B14  Neuromuscular re-ed Sidestepping without UE support 3 round trips at counter top March walking with one UE support 3 round trips at counter top Tandem balance 30 sec X 3 bilat with intermitt UE support PRN  05/30/2023:  Therapeutic  Exercise:  Nustep seat 7 X 8 min UE/LE L6 Gastroc stretch BLEs on incline board 30 sec hold 3 reps and heel raises on incline board 10 reps 2 sets during rests.     Therapeutic Activities:(strength for stairs, transfers, gait) TUG time with cane 14:37 sec  TUG time without cane 12:88  Step ups 6 inch step with one UE support X 10 each leg Seated LAQ 3# 2X10 bilat Standing hip abductions 3# 2X10 bilat Standing hamstring curls 3# 2X10 bilat Leg press DL 78# 2N56   Neuromuscular re-ed Sidestepping without UE support 3 round trips at counter top March walking with one UE support 3 round trips at counter top Tandem balance 30 sec X 3 bilat with intermitt UE support PRN   PATIENT EDUCATION:  Education details: HEP, POC Person educated: Patient Education method: Programmer, multimedia, Demonstration, Verbal cues, and Handouts Education comprehension: verbalized understanding, returned demonstration, and verbal cues required  HOME EXERCISE PROGRAM: Access Code: [email or web address] URL: [email or web address] Date: 05/11/2023 Prepared by: Vladimir Faster  Exercises - Seated Long Arc Quad  - 1 x daily - 7 x weekly - 2 sets - 10 reps - 5 seconds hold - Seated Hamstring Stretch with Strap  - 1 x daily - 7 x weekly - 1 sets - 3 reps - 30 seconds hold - sit to stand to sit with counter  - 1 x daily - 7 x weekly - 2 sets - 10 reps - 5 seconds hold - squat at sink with chair behind you  - 1 x daily - 7 x weekly - 1 sets - 10 reps - 5 seconds hold - Modified [name] Stretch  - 1 x daily - 7 x weekly - 1 sets - 3 reps - 30 seconds hold - Supine Bridge  - 1 x daily - 7 x weekly - 2 sets - 10 reps - 5 seconds hold  ASSESSMENT: CLINICAL IMPRESSION: She had less pain today and good tolerance to functional activity progressions, continue skilled PT services recommended to work toward functional goals.   OBJECTIVE IMPAIRMENTS: Abnormal gait, decreased activity tolerance, decreased balance, decreased  coordination, decreased endurance, decreased knowledge of condition, decreased knowledge of use of DME, decreased mobility, decreased ROM, decreased strength, dizziness, impaired flexibility, postural dysfunction, and pain.   ACTIVITY LIMITATIONS: carrying, lifting, standing, stairs, transfers, and locomotion level  PARTICIPATION LIMITATIONS: meal prep, cleaning, and community activity  PERSONAL FACTORS: Age, Fitness, Past/current experiences, and 3+ comorbidities: see PMH  are also affecting patient's functional outcome.   REHAB POTENTIAL: Good  CLINICAL DECISION MAKING: Evolving/moderate complexity  EVALUATION COMPLEXITY: Moderate   GOALS: Goals reviewed with patient? Yes  SHORT TERM GOALS: (target date for Short term goals 06/03/2023)   1.  Patient will demonstrate independent use of home exercise program to maintain progress from in clinic treatments. Baseline: See objective data Goal status: MET 05/23/23  2. Timed Up & Go <15 sec with cane Baseline: See objective data Goal status: MET 05/30/23    LONG TERM GOALS: (target dates for all long term goals  07/01/2023 )   1. Patient will demonstrate/report bilateral pain at worst  less than or equal to 4/10 to facilitate minimal limitation in daily activity secondary to pain symptoms. Baseline: See objective data Goal status: Ongoing  05/25/2023   2. Patient will demonstrate independent use of home exercise program to facilitate ability to maintain/progress functional gains from skilled physical therapy services. Baseline: See objective data Goal status: Ongoing 05/25/2023  3.  Patient reports Patient-Specific Activity Score improved the average by 3 to indicate improvement in functional activities.  Baseline: SEE OBJECTIVE DATA Goal status: Ongoing  4/2/20255   4.  Timed Up & Go cognitive <20 sec. Baseline: See objective data Goal status: Ongoing  05/25/2023   5.  Patient reports no fall in last 2 weeks.  Baseline: See objective  data Goal status: Ongoing  05/25/2023   PLAN:  PT FREQUENCY:  2x/week  PT DURATION: 8 weeks  PLANNED INTERVENTIONS: 97164- PT Re-evaluation, 97110-Therapeutic exercises, 97530- Therapeutic activity, 97112- Neuromuscular re-education, 97535- Self Care, 16109- Manual therapy, [phone]- Gait training, [phone]- Canalith repositioning, Patient/Family education, Balance training, Stair training, Taping, Dry Needling, Joint mobilization, Vestibular training, DME instructions, and Physical Performance Testing  PLAN FOR NEXT SESSION:, functional strengthening and therapeutic activities to improve standing balance.   Continue to work on range and strength for hips, knees.    April Manson, PT, DPT 06/01/2023, 10:52 AM

## 2023-06-06 ENCOUNTER — Ambulatory Visit (INDEPENDENT_AMBULATORY_CARE_PROVIDER_SITE_OTHER): Admitting: Physical Therapy

## 2023-06-06 ENCOUNTER — Encounter: Payer: Self-pay | Admitting: Physical Therapy

## 2023-06-06 DIAGNOSIS — R293 Abnormal posture: Secondary | ICD-10-CM | POA: Diagnosis not present

## 2023-06-06 DIAGNOSIS — R2689 Other abnormalities of gait and mobility: Secondary | ICD-10-CM | POA: Diagnosis not present

## 2023-06-06 DIAGNOSIS — R2681 Unsteadiness on feet: Secondary | ICD-10-CM

## 2023-06-06 DIAGNOSIS — M6281 Muscle weakness (generalized): Secondary | ICD-10-CM

## 2023-06-06 DIAGNOSIS — M25561 Pain in right knee: Secondary | ICD-10-CM

## 2023-06-06 DIAGNOSIS — M25562 Pain in left knee: Secondary | ICD-10-CM | POA: Diagnosis not present

## 2023-06-06 DIAGNOSIS — G8929 Other chronic pain: Secondary | ICD-10-CM

## 2023-06-06 NOTE — Therapy (Signed)
 OUTPATIENT PHYSICAL THERAPY LOWER EXTREMITY TREATMENT   Patient Name: April Church MRN: [id number] DOB:[birth year], 83 y.o., female Today's Date: 06/06/2023  END OF SESSION:  PT End of Session - 06/06/23 1258     Visit Number 8    Number of Visits 16    Date for PT Re-Evaluation 07/01/23    Authorization Type Medicare & BCBS supplement    Progress Note Due on Visit 10    PT Start Time 1258    PT Stop Time 1340    PT Time Calculation (min) 42 min    Activity Tolerance Patient tolerated treatment well    Behavior During Therapy [facility] for tasks assessed/performed                   Past Medical History:  Diagnosis Date   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 2011- no further tx. now- Dr. Donnie Coffin , Dr. Dayton Scrape   Cyst of finger 11/2011   annular cyst right long finger   Dental crowns present    Dermatitis    Frequency of urination    GERD (gastroesophageal reflux disease)    Hemorrhoid    History of colon polyps 2009   Also noted 2012 and 2017 by colonoscopy.   History of shingles 1971   Had right ischial recurrence in the March 2019   Hyperlipidemia    On atorvastatin   Hypertension    under control, has been on med. x 2 yrs.   Liposarcoma of left shoulder (HCC) 1969   Parkinson's disease (HCC)    With mild tremor (neurologist Dr. Marjory Lies), neurosurgeon Dr. Lovell Sheehan   PONV (postoperative nausea and vomiting)    Seasonal allergies    Shingles of eyelid 1971   Right eyelid; recurrent -> last episode 05/11/2017   Tremors of nervous system    hands-essential tremor; associated with Parkinson's.   Trigger finger of right hand 11/2011   long finger   Past Surgical History:  Procedure Laterality Date   14-day Zio Patch Monitor  08/2020   (report to be scanned): Predominant SR w/ HR 61 to 142 bpm and average 86  bpm.  Total of 59 episodes of PAT/PSVT  4 to 15 beats (not noted on diary).  Fastest was 5 beats at a rate of 193 bpm, longest was 15 beats at a rate of 106 bpm.  Rare isolated PACs (as well as couplets and triplets) with rare isolated PVCs.  No sustained arrhythmias or bradycardia to explain syncope.   ANTERIOR CERVICAL DECOMPRESSION/DISCECTOMY FUSION 4 LEVELS N/A 11/14/2013   Procedure: ANTERIOR CERVICAL DECOMPRESSION/DISCECTOMY FUSION 4 LEVELS;  Surgeon: Tressie Stalker, MD;  Location: [facility];  Service: Neurosurgery;  Laterality: N/A;  C34 C45 C56 C67 anterior cervical fusion with interbody prosthesis plating and  bonegraft   APPENDECTOMY  age 34   BREAST LUMPECTOMY  06/16/2009   left; SLN bx.   BREAST LUMPECTOMY  07/01/2009   re-excision   BREAST SURGERY  02/22/1997   reduction   CATARACT EXTRACTION, BILATERAL     COLONOSCOPY WITH PROPOFOL N/A 12/03/2014   Procedure: COLONOSCOPY WITH PROPOFOL;  Surgeon: Charolett Bumpers, MD;  Location: [facility];  Service: Endoscopy;  Laterality: N/A;   FOOT SURGERY  06/22/2011   left   KNEE ARTHROSCOPY  03/12/2005   right   KNEE ARTHROSCOPY  left   Lower Extremity Venous Dopplers  12/12/2020   No DVT bilaterally in the deep veins or superficial veins.  No deep or superficial venous reflux noted bilaterally with exception of Right SSV at the knee.   NASAL SINUS SURGERY     x 2   NEPHRECTOMY LIVING DONOR Left 04/22/2008   donated to spouse 2010([facility])   TRANSTHORACIC ECHOCARDIOGRAM  12/10/2020   EF 60 to 65%.  Normal LV size and function.  No heart WMA.  GR 1 DD.  Normal RV, RVP and RAP.Aaron Aas  Normal valves.   TRIGGER FINGER RELEASE  12/16/2011   Procedure: RELEASE TRIGGER FINGER/A-1 PULLEY;  Surgeon: Milagros Alf, MD;  Location: [facility];  Service: Orthopedics;  Laterality: Right;  RIGHT LONG FINGER TRIGGER RELEASE & ANNULAR CYST EXCISION   TUMOR EXCISION  age 72   right arm   Patient Active Problem List   Diagnosis  Date Noted   Rectal bleeding 07/27/2022   Hemorrhoids 07/27/2022   Chronic pain of both knees 06/21/2022   Moderate persistent asthma without complication 03/27/2021   PSVT (paroxysmal supraventricular tachycardia) (HCC) 11/26/2020   Syncope and collapse 11/24/2020   Primary osteoarthritis of right knee 05/12/2020   Primary osteoarthritis of left knee 05/12/2020   Hyperlipidemia    Bilateral calf pain 03/05/2019   Genetic testing 02/13/2019   Family history of ovarian cancer    Family history of bladder cancer    Family history of colon cancer    Family history of leukemia    Lumbar radiculopathy 12/08/2018   Myofascial pain 12/08/2018   Impaired gait and mobility 12/08/2018   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   Chronic nonallergic rhinitis 12/29/2016   Mild persistent asthma, uncomplicated 12/29/2016   Vocal fold paralysis, 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   Gastroesophageal reflux disease 12/29/2016   Cervical spondylosis with myelopathy and radiculopathy 11/14/2013   Essential tremor 06/13/2013   Cervical spondylosis without myelopathy 06/13/2013   Breast cancer of lower-outer quadrant of left female breast (HCC) 09/10/2010    PCP: Arva Lathe, MD  REFERRING PROVIDER: Shauna Del, MD  REFERRING DIAG: M17.0 (ICD-10-CM) - Bilateral primary osteoarthritis of knee   THERAPY DIAG:  Unsteadiness on feet  Abnormal posture  Chronic pain of both knees  Muscle weakness (generalized)  Other abnormalities of gait and mobility  Rationale for Evaluation and Treatment: Rehabilitation  ONSET DATE: 04/28/2023 MD referral to PT  SUBJECTIVE:   SUBJECTIVE STATEMENT: She feels like she is getting better.     PERTINENT HISTORY: Cervical Decompression surgery 2015, Supraventicular tachycardia, syncope, OA, breast CA, Donated right kidney to her husband, HTN, Parkinson's Disease with slight tremor,   PAIN:  NPRS scale: today 4/10  and since last PT 1/10 - 5/10 Pain location: knees  RLE>LLE more medially Pain description: bad ache Aggravating factors: walking,  standing more than few minutes Relieving factors: injections,  tylenol takes edge off,  sleeping with pillow bw knees.   PRECAUTIONS: Fall  WEIGHT BEARING RESTRICTIONS: No  FALLS:  Has patient fallen in last 6 months? Yes. Number of falls 3 with knot on head,   LIVING ENVIRONMENT: Lives with: lives alone Lives in: Townhouse 2-story with master bedroom & bathroom downstairs,  Stairs: Yes: Internal: 14-15 steps; on right going up and on left going up and External: 4 steps; can reach both Has following equipment at home: Single point cane, Walker - 4 wheeled, shower chair, and Grab bars  OCCUPATION: retired  PLOF: Independent  PATIENT GOALS:  learn exercises  to function better and with less pain.   Next MD visit:  OBJECTIVE:  DIAGNOSTIC FINDINGS: Radiographs show evidence of joint space narrowing, osteophytes, subchondral sclerosis and/or subchondral cysts  Patient-Specific Activity Scoring Scheme  "0" represents "unable to perform." "10" represents "able to perform at prior level. 0 1 2 3 4 5 6 7 8 9  10 (Date and Score)   Activity Eval  06/06/23   1. Walk around house  5  7  2. Stairs   2 (pt changed rating)  5  3. Walking in community 3 5  4.    5.    Score 4.33 5.67   Total score = sum of the activity scores/number of activities Minimum detectable change (90%CI) for average score = 2 points Minimum detectable change (90%CI) for single activity score = 3 points  COGNITION: Overall cognitive status: WFL   POSTURE: rounded shoulders, forward head, and flexed trunk   PALPATION: Tenderness bilateral knees medially  LOWER EXTREMITY ROM:   ROM Right eval Left eval Right 05/25/23 Left 05/25/23  Hip flexion      Hip extension Standing -11* Standing -12*    Hip abduction      Hip adduction      Hip internal rotation      Hip external rotation      Knee flexion seated A: 104*  Seated A: 115*    Knee extension Seated LAQ: -8* Seated LAQ -9* Seated LAQ -4 Seated LAQ -6  Ankle dorsiflexion      Ankle plantarflexion      Ankle inversion      Ankle eversion       (Blank rows = not tested)  LOWER EXTREMITY MMT:  MMT Right eval Left eval  Hip flexion    Hip extension    Hip abduction    Hip adduction    Hip internal rotation    Hip external rotation    Knee flexion seated 4/5 Seated 4/5  Knee extension 4/5 4/5  Ankle dorsiflexion 5/5 5/5  Ankle plantarflexion    Ankle inversion    Ankle eversion     (Blank rows = not tested)  FUNCTIONAL TESTS:  18 inch chair transfer: Requires use of single upper extremity on armrest to arise from an 18 inch chair Timed Up and Go with single-point cane: Standard 19.59 seconds and cognitive 30.40 seconds (55% increase) with need to stop naming with turn and limited ability to restart.  Both test indicate high risk of falls especially when distracted.  05/30/23: TUG time with cane 14:37 sec  TUG time without cane 12:88  GAIT: Distance walked: 100' Assistive device utilized: Single point cane Level of assistance: SBA Comments: Patient has forward trunk lean, hip and knee flexion in stance, poor clearance in swing and decreased arm swing.   TODAY'S  TREATMENT                                                                          DATE: 06/06/2023:  Therapeutic Exercise:  Nustep seat 7 X 8  min UE/LE L6 Hamstring stretch seated with cane DF 30 sec hold 2 reps BLEs  Therapeutic Activities: Stairs with single rail and contralateral UE cane (6 steps with right  rail & 5 steps left rail):  step-to pattern alternating lead LE - verbal cues on technique.  Descending better knee control with LE stepping first then moving cane and ascending moving cane first.  Standing TKE (green Theraband) with weight shift over limb for stance and stepping contralateral LE from terminal stance to tapping 6" cone anteriorly BUE support  //bars 10 reps ea LE.  PT tactile & verbal cues on stance LE ext.   Standing alternating LEs red theraband figure 8 above knees with PT tactile & verbal cues on stance LE ext - 10 reps ea abduction & extension.   Bilateral heel raises with BUE support 10 reps Stepping over cane on floor in front of toes - holding step position with cues on LE ext without UE support - 10 sec hold 5 reps ea LE in front.  Initially required minA for balance progressed to SBA.   Pt amb with cane 100' focus on BLE hip/knee ext in stance.  PT noted improved clearance in swing.     TREATMENT                                                                          DATE: 06/01/2023:  Therapeutic Exercise:  Attempted recumbent bike not not enough knee ROM to do this without pain so discontinued Nustep seat 7 X 8  min UE/LE L6  Therapeutic Activities:(strength for stairs, transfers, gait) Sit to stand without UE support X 10 (did place hands in lap) Step ups 6 inch step with one 2 UE support X 10 each leg Seated LAQ 3# 2X10 bilat Standing hip abductions red 2X10 bilat Standing hamstring curl red 2X10 bilat Leg press DL 78# [id number]  Neuromuscular re-ed Sidestepping without UE support 3 round trips at counter top March walking with one UE support 3 round trips at counter top Tandem balance 30 sec X 3 bilat with intermitt UE support PRN    05/30/2023:  Therapeutic Exercise:  Nustep seat 7 X 8 min UE/LE L6 Gastroc stretch BLEs on incline board 30 sec hold 3 reps and heel raises on incline board 10 reps 2 sets during rests.     Therapeutic Activities:(strength for stairs, transfers, gait) TUG time with cane 14:37 sec  TUG time without cane 12:88  Step ups 6 inch step with one UE support X 10 each leg Seated LAQ 3# 2X10 bilat Standing hip abductions 3# 2X10 bilat Standing hamstring curls 3# 2X10 bilat Leg press DL 29# [id number]   Neuromuscular re-ed Sidestepping without UE support 3 round trips at counter top March  walking with one UE support 3 round trips at counter top Tandem balance 30 sec X 3 bilat with intermitt UE support PRN   PATIENT EDUCATION:  Education details: HEP, POC Person educated: Patient Education method: Programmer, multimedia, Demonstration, Verbal cues, and Handouts Education comprehension: verbalized understanding, returned demonstration, and verbal cues required  HOME EXERCISE PROGRAM: Access Code: [email or web address] URL: [email or web address] Date: 05/11/2023 Prepared by: Lorie Rook  Exercises - Seated Long Arc Quad  - 1 x daily - 7 x weekly - 2 sets - 10 reps - 5 seconds hold - Seated Hamstring Stretch with Strap  - 1 x daily - 7 x weekly - 1  sets - 3 reps - 30 seconds hold - sit to stand to sit with counter  - 1 x daily - 7 x weekly - 2 sets - 10 reps - 5 seconds hold - squat at sink with chair behind you  - 1 x daily - 7 x weekly - 1 sets - 10 reps - 5 seconds hold - Modified [name] Stretch  - 1 x daily - 7 x weekly - 1 sets - 3 reps - 30 seconds hold - Supine Bridge  - 1 x daily - 7 x weekly - 2 sets - 10 reps - 5 seconds hold  ASSESSMENT: CLINICAL IMPRESSION: Patient reported slight improvement in Patient Specific Activities.  PT performed activities facilitating hip/knee ext in stance.    OBJECTIVE IMPAIRMENTS: Abnormal gait, decreased activity tolerance, decreased balance, decreased coordination, decreased endurance, decreased knowledge of condition, decreased knowledge of use of DME, decreased mobility, decreased ROM, decreased strength, dizziness, impaired flexibility, postural dysfunction, and pain.   ACTIVITY LIMITATIONS: carrying, lifting, standing, stairs, transfers, and locomotion level  PARTICIPATION LIMITATIONS: meal prep, cleaning, and community activity  PERSONAL FACTORS: Age, Fitness, Past/current experiences, and 3+ comorbidities: see PMH  are also affecting patient's functional outcome.   REHAB POTENTIAL: Good  CLINICAL DECISION MAKING:  Evolving/moderate complexity  EVALUATION COMPLEXITY: Moderate   GOALS: Goals reviewed with patient? Yes  SHORT TERM GOALS: (target date for Short term goals 06/03/2023)   1.  Patient will demonstrate independent use of home exercise program to maintain progress from in clinic treatments. Baseline: See objective data Goal status: MET 05/23/23  2. Timed Up & Go <15 sec with cane Baseline: See objective data Goal status: MET 05/30/23  LONG TERM GOALS: (target dates for all long term goals  07/01/2023 )   1. Patient will demonstrate/report bilateral pain at worst less than or equal to 4/10 to facilitate minimal limitation in daily activity secondary to pain symptoms. Baseline: See objective data Goal status: Ongoing  06/06/2023   2. Patient will demonstrate independent use of home exercise program to facilitate ability to maintain/progress functional gains from skilled physical therapy services. Baseline: See objective data Goal status: Ongoing 06/06/2023  3.  Patient reports Patient-Specific Activity Score improved the average by 3 to indicate improvement in functional activities.  Baseline: SEE OBJECTIVE DATA Goal status: Ongoing  06/06/2023   4.  Timed Up & Go cognitive <20 sec. Baseline: See objective data Goal status: Ongoing  06/06/2023   5.  Patient reports no fall in last 2 weeks.  Baseline: See objective data Goal status: Ongoing  06/06/2023   PLAN:  PT FREQUENCY:  2x/week  PT DURATION: 8 weeks  PLANNED INTERVENTIONS: 97164- PT Re-evaluation, 97110-Therapeutic exercises, 97530- Therapeutic activity, 97112- Neuromuscular re-education, 97535- Self Care, 40981- Manual therapy, [phone]- Gait training, [phone]- Canalith repositioning, Patient/Family education, Balance training, Stair training, Taping, Dry Needling, Joint mobilization, Vestibular training, DME instructions, and Physical Performance Testing  PLAN FOR NEXT SESSION:   continue with functional strengthening and  therapeutic activities to improve standing balance.   Continue to work on range and strength for hips, knees.    Cordero Surette, PT, DPT 06/06/2023, 1:51 PM

## 2023-06-08 ENCOUNTER — Ambulatory Visit (INDEPENDENT_AMBULATORY_CARE_PROVIDER_SITE_OTHER): Admitting: Physical Therapy

## 2023-06-08 ENCOUNTER — Encounter: Payer: Self-pay | Admitting: Physical Therapy

## 2023-06-08 DIAGNOSIS — R293 Abnormal posture: Secondary | ICD-10-CM | POA: Diagnosis not present

## 2023-06-08 DIAGNOSIS — M6281 Muscle weakness (generalized): Secondary | ICD-10-CM

## 2023-06-08 DIAGNOSIS — R2681 Unsteadiness on feet: Secondary | ICD-10-CM

## 2023-06-08 DIAGNOSIS — M25562 Pain in left knee: Secondary | ICD-10-CM

## 2023-06-08 DIAGNOSIS — M25561 Pain in right knee: Secondary | ICD-10-CM | POA: Diagnosis not present

## 2023-06-08 DIAGNOSIS — R2689 Other abnormalities of gait and mobility: Secondary | ICD-10-CM

## 2023-06-08 DIAGNOSIS — G8929 Other chronic pain: Secondary | ICD-10-CM | POA: Diagnosis not present

## 2023-06-08 NOTE — Therapy (Signed)
 OUTPATIENT PHYSICAL THERAPY LOWER EXTREMITY TREATMENT   Patient Name: April Church MRN: [id number] DOB:09/08/1940, 83 y.o., female Today's Date: 06/08/2023  END OF SESSION:  PT End of Session - 06/08/23 1301     Visit Number 9    Number of Visits 16    Date for PT Re-Evaluation 07/01/23    Authorization Type Medicare & BCBS supplement    Progress Note Due on Visit 10    PT Start Time 1301    PT Stop Time 1344    PT Time Calculation (min) 43 min    Activity Tolerance Patient tolerated treatment well    Behavior During Therapy [facility] for tasks assessed/performed                    Past Medical History:  Diagnosis Date   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 2011- no further tx. now- Dr. Kendra Pavy , Dr. Ike Malady   Cyst of finger 11/2011   annular cyst right long finger   Dental crowns present    Dermatitis    Frequency of urination    GERD (gastroesophageal reflux disease)    Hemorrhoid    History of colon polyps 2009   Also noted 2012 and 2017 by colonoscopy.   History of shingles 1971   Had right ischial recurrence in the March 2019   Hyperlipidemia    On atorvastatin   Hypertension    under control, has been on med. x 2 yrs.   Liposarcoma of left shoulder (HCC) 1969   Parkinson's disease (HCC)    With mild tremor (neurologist Dr. Salli Crawley), neurosurgeon Dr. Larrie Po   PONV (postoperative nausea and vomiting)    Seasonal allergies    Shingles of eyelid 1971   Right eyelid; recurrent -> last episode 05/11/2017   Tremors of nervous system    hands-essential tremor; associated with Parkinson's.   Trigger finger of right hand 11/2011   long finger   Past Surgical History:  Procedure Laterality Date   14-day Zio Patch Monitor  08/2020   (report to be scanned): Predominant SR w/ HR 61 to 142 bpm and average 86  bpm.  Total of 59 episodes of PAT/PSVT  4 to 15 beats (not noted on diary).  Fastest was 5 beats at a rate of 193 bpm, longest was 15 beats at a rate of 106 bpm.  Rare isolated PACs (as well as couplets and triplets) with rare isolated PVCs.  No sustained arrhythmias or bradycardia to explain syncope.   ANTERIOR CERVICAL DECOMPRESSION/DISCECTOMY FUSION 4 LEVELS N/A 11/14/2013   Procedure: ANTERIOR CERVICAL DECOMPRESSION/DISCECTOMY FUSION 4 LEVELS;  Surgeon: Garry Kansas, MD;  Location: [facility];  Service: Neurosurgery;  Laterality: N/A;  C34 C45 C56 C67 anterior cervical fusion with interbody prosthesis plating and  bonegraft   APPENDECTOMY  age 21   BREAST LUMPECTOMY  06/16/2009   left; SLN bx.   BREAST LUMPECTOMY  07/01/2009   re-excision   BREAST SURGERY  02/22/1997   reduction   CATARACT EXTRACTION, BILATERAL     COLONOSCOPY WITH PROPOFOL N/A 12/03/2014   Procedure: COLONOSCOPY WITH PROPOFOL;  Surgeon: Garrett Kallman, MD;  Location: [facility];  Service: Endoscopy;  Laterality: N/A;   FOOT SURGERY  06/22/2011   left   KNEE ARTHROSCOPY  03/12/2005   right   KNEE ARTHROSCOPY  left   Lower Extremity Venous Dopplers  12/12/2020   No DVT bilaterally in the deep veins or superficial veins.  No deep or superficial venous reflux noted bilaterally with exception of Right SSV at the knee.   NASAL SINUS SURGERY     x 2   NEPHRECTOMY LIVING DONOR Left 04/22/2008   donated to spouse 2010([facility])   TRANSTHORACIC ECHOCARDIOGRAM  12/10/2020   EF 60 to 65%.  Normal LV size and function.  No heart WMA.  GR 1 DD.  Normal RV, RVP and RAP.Aaron Aas  Normal valves.   TRIGGER FINGER RELEASE  12/16/2011   Procedure: RELEASE TRIGGER FINGER/A-1 PULLEY;  Surgeon: Milagros Alf, MD;  Location: [facility];  Service: Orthopedics;  Laterality: Right;  RIGHT LONG FINGER TRIGGER RELEASE & ANNULAR CYST EXCISION   TUMOR EXCISION  age 52   right arm   Patient Active Problem List   Diagnosis  Date Noted   Rectal bleeding 07/27/2022   Hemorrhoids 07/27/2022   Chronic pain of both knees 06/21/2022   Moderate persistent asthma without complication 03/27/2021   PSVT (paroxysmal supraventricular tachycardia) (HCC) 11/26/2020   Syncope and collapse 11/24/2020   Primary osteoarthritis of right knee 05/12/2020   Primary osteoarthritis of left knee 05/12/2020   Hyperlipidemia    Bilateral calf pain 03/05/2019   Genetic testing 02/13/2019   Family history of ovarian cancer    Family history of bladder cancer    Family history of colon cancer    Family history of leukemia    Lumbar radiculopathy 12/08/2018   Myofascial pain 12/08/2018   Impaired gait and mobility 12/08/2018   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   Chronic nonallergic rhinitis 12/29/2016   Mild persistent asthma, uncomplicated 12/29/2016   Vocal fold paralysis, 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   Gastroesophageal reflux disease 12/29/2016   Cervical spondylosis with myelopathy and radiculopathy 11/14/2013   Essential tremor 06/13/2013   Cervical spondylosis without myelopathy 06/13/2013   Breast cancer of lower-outer quadrant of left female breast (HCC) 09/10/2010    PCP: Arva Lathe, MD  REFERRING PROVIDER: Shauna Del, MD  REFERRING DIAG: M17.0 (ICD-10-CM) - Bilateral primary osteoarthritis of knee   THERAPY DIAG:  Unsteadiness on feet  Abnormal posture  Chronic pain of both knees  Muscle weakness (generalized)  Other abnormalities of gait and mobility  Rationale for Evaluation and Treatment: Rehabilitation  ONSET DATE: 04/28/2023 MD referral to PT  SUBJECTIVE:   SUBJECTIVE STATEMENT: No changes.     PERTINENT HISTORY: Cervical Decompression surgery 2015, Supraventicular tachycardia, syncope, OA, breast CA, Donated right kidney to her husband, HTN, Parkinson's Disease with slight tremor,   PAIN:  NPRS scale: today 0/10 at rest & 4/10 moving and since last PT 0/10 - 5/10 Pain location: knees  RLE>LLE more medially Pain description: bad ache Aggravating factors: walking,  standing more than few minutes Relieving factors: injections,  tylenol takes edge off,  sleeping with pillow bw knees.   PRECAUTIONS: Fall  WEIGHT BEARING RESTRICTIONS: No  FALLS:  Has patient fallen in last 6 months? Yes. Number of falls 3 with knot on head,   LIVING ENVIRONMENT: Lives with: lives alone Lives in: Townhouse 2-story with master bedroom & bathroom downstairs,  Stairs: Yes: Internal: 14-15 steps; on right going up and on left going up and External: 4 steps; can reach both Has following equipment at home: Single point cane, Walker - 4 wheeled, shower chair, and Grab bars  OCCUPATION: retired  PLOF: Independent  PATIENT GOALS:  learn exercises to  function better and with less pain.   Next MD visit:  OBJECTIVE:  DIAGNOSTIC FINDINGS: Radiographs show evidence of joint space narrowing, osteophytes, subchondral sclerosis and/or subchondral cysts  Patient-Specific Activity Scoring Scheme  "0" represents "unable to perform." "10" represents "able to perform at prior level. 0 1 2 3 4 5 6 7 8 9  10 (Date and Score)   Activity Eval  06/06/23   1. Walk around house  5  7  2. Stairs   2 (pt changed rating)  5  3. Walking in community 3 5  4.    5.    Score 4.33 5.67   Total score = sum of the activity scores/number of activities Minimum detectable change (90%CI) for average score = 2 points Minimum detectable change (90%CI) for single activity score = 3 points  COGNITION: Overall cognitive status: WFL   POSTURE: rounded shoulders, forward head, and flexed trunk   PALPATION: Tenderness bilateral knees medially  LOWER EXTREMITY ROM:   ROM Right eval Left eval Right 05/25/23 Left 05/25/23 Right/Left 06/08/23  Hip flexion       Hip extension Standing -11* Standing -12*     Hip abduction       Hip adduction       Hip internal rotation       Hip external rotation       Knee  flexion seated A: 104* Seated A: 115*     Knee extension Seated LAQ: -8* Seated LAQ -9* Seated LAQ -4 Seated LAQ -6 Seated LAQ -3/-5  Ankle dorsiflexion       Ankle plantarflexion       Ankle inversion       Ankle eversion        (Blank rows = not tested)  LOWER EXTREMITY MMT:  MMT Right eval Left eval  Hip flexion    Hip extension    Hip abduction    Hip adduction    Hip internal rotation    Hip external rotation    Knee flexion seated 4/5 Seated 4/5  Knee extension 4/5 4/5  Ankle dorsiflexion 5/5 5/5  Ankle plantarflexion    Ankle inversion    Ankle eversion     (Blank rows = not tested)  FUNCTIONAL TESTS:  05/09/2023 Evaluation: 18 inch chair transfer: Requires use of single upper extremity on armrest to arise from an 18 inch chair Timed Up and Go with single-point cane: Standard 19.59 seconds and cognitive 30.40 seconds (55% increase) with need to stop naming with turn and limited ability to restart.  Both test indicate high risk of falls especially when distracted.  05/30/23: TUG time with cane 14:37 sec  TUG time without cane 12:88  GAIT: 06/08/2023: Gait Velocity with cane self-selected  1.93 ft/sec and fast pace 2.25  ft/sec  No device self-selected  2.19 ft/sec  05/09/2023 Evaluation: Distance walked: 100' Assistive device utilized: Single point cane Level of assistance: SBA Comments: Patient has forward trunk lean, hip and knee flexion in stance, poor clearance in swing and decreased arm swing.   TODAY'S  TREATMENT  DATE: 06/08/2023:  Therapeutic Exercise:  Nustep seat 7 BUE/BLE L6 for 4 min then BLEs only level 4 for 4 min Hamstring stretch seated with cane DF 30 sec hold 2 reps BLEs  Therapeutic Activities: Leg press BLEs 68# 10 reps 2 sets;  single leg 31# 10 reps ea LE Standing TKE (green Theraband) step up on 4" step with weight shift over limb for stance and stepping  contralateral LE from terminal stance to tapping 6" cone anteriorly BUE support //bars 10 reps ea LE.  PT tactile & verbal cues on stance LE ext.   Standing side step up on 4" step with weight shift over limb for stance and stepping contralateral LE from terminal stance to tapping 6" cone anteriorly BUE support //bars 10 reps ea LE. Standing alternating LEs red theraband figure 8 above knees with PT tactile & verbal cues on stance LE ext - 10 reps ea abduction & extension.   Bilateral heel raises with BUE support 10 reps Stepping over cane on floor in front of toes - holding step position with cues on LE ext without UE support - 10 sec hold 5 reps ea LE in front.  Initially required minA for balance progressed to SBA.  Placing foot directly on cane that was laying on the floor and stepping contralateral lower extremity forward and back over the cane.  Activity set to work on balance when she is on uneven terrain like to run her under a door frame.  Patient performed 10 reps for leg and improved with the activity.   TREATMENT                                                                          DATE: 06/06/2023:  Therapeutic Exercise:  Nustep seat 7 X 8  min UE/LE L6 Hamstring stretch seated with cane DF 30 sec hold 2 reps BLEs  Therapeutic Activities: Stairs with single rail and contralateral UE cane (6 steps with right rail & 5 steps left rail):  step-to pattern alternating lead LE - verbal cues on technique.  Descending better knee control with LE stepping first then moving cane and ascending moving cane first.  Standing TKE (green Theraband) with weight shift over limb for stance and stepping contralateral LE from terminal stance to tapping 6" cone anteriorly BUE support //bars 10 reps ea LE.  PT tactile & verbal cues on stance LE ext.   Standing alternating LEs red theraband figure 8 above knees with PT tactile & verbal cues on stance LE ext - 10 reps ea abduction & extension.   Bilateral  heel raises with BUE support 10 reps Stepping over cane on floor in front of toes - holding step position with cues on LE ext without UE support - 10 sec hold 5 reps ea LE in front.  Initially required minA for balance progressed to SBA.   Pt amb with cane 100' focus on BLE hip/knee ext in stance.  PT noted improved clearance in swing.     TREATMENT  DATE: 06/01/2023:  Therapeutic Exercise:  Attempted recumbent bike not not enough knee ROM to do this without pain so discontinued Nustep seat 7 X 8  min UE/LE L6  Therapeutic Activities:(strength for stairs, transfers, gait) Sit to stand without UE support X 10 (did place hands in lap) Step ups 6 inch step with one 2 UE support X 10 each leg Seated LAQ 3# 2X10 bilat Standing hip abductions red 2X10 bilat Standing hamstring curl red 2X10 bilat Leg press DL 16# [id number]  Neuromuscular re-ed Sidestepping without UE support 3 round trips at counter top March walking with one UE support 3 round trips at counter top Tandem balance 30 sec X 3 bilat with intermitt UE support PRN   PATIENT EDUCATION:  Education details: HEP, POC Person educated: Patient Education method: Programmer, multimedia, Demonstration, Verbal cues, and Handouts Education comprehension: verbalized understanding, returned demonstration, and verbal cues required  HOME EXERCISE PROGRAM: Access Code: [email or web address] URL: [email or web address] Date: 05/11/2023 Prepared by: Lorie Rook  Exercises - Seated Long Arc Quad  - 1 x daily - 7 x weekly - 2 sets - 10 reps - 5 seconds hold - Seated Hamstring Stretch with Strap  - 1 x daily - 7 x weekly - 1 sets - 3 reps - 30 seconds hold - sit to stand to sit with counter  - 1 x daily - 7 x weekly - 2 sets - 10 reps - 5 seconds hold - squat at sink with chair behind you  - 1 x daily - 7 x weekly - 1 sets - 10 reps - 5 seconds hold - Modified [name] Stretch  - 1 x  daily - 7 x weekly - 1 sets - 3 reps - 30 seconds hold - Supine Bridge  - 1 x daily - 7 x weekly - 2 sets - 10 reps - 5 seconds hold  ASSESSMENT: CLINICAL IMPRESSION: Patient continues to improve bilateral lower extremity strength with functional activities.  She is able to ambulate and perform activities with less stance knee flexion which should decrease the fatigue and pain in her lower extremity.  PT performed activities facilitating hip/knee ext in stance.    OBJECTIVE IMPAIRMENTS: Abnormal gait, decreased activity tolerance, decreased balance, decreased coordination, decreased endurance, decreased knowledge of condition, decreased knowledge of use of DME, decreased mobility, decreased ROM, decreased strength, dizziness, impaired flexibility, postural dysfunction, and pain.   ACTIVITY LIMITATIONS: carrying, lifting, standing, stairs, transfers, and locomotion level  PARTICIPATION LIMITATIONS: meal prep, cleaning, and community activity  PERSONAL FACTORS: Age, Fitness, Past/current experiences, and 3+ comorbidities: see PMH  are also affecting patient's functional outcome.   REHAB POTENTIAL: Good  CLINICAL DECISION MAKING: Evolving/moderate complexity  EVALUATION COMPLEXITY: Moderate   GOALS: Goals reviewed with patient? Yes  SHORT TERM GOALS: (target date for Short term goals 06/03/2023)   1.  Patient will demonstrate independent use of home exercise program to maintain progress from in clinic treatments. Baseline: See objective data Goal status: MET 05/23/23  2. Timed Up & Go <15 sec with cane Baseline: See objective data Goal status: MET 05/30/23  LONG TERM GOALS: (target dates for all long term goals  07/01/2023 )   1. Patient will demonstrate/report bilateral pain at worst less than or equal to 4/10 to facilitate minimal limitation in daily activity secondary to pain symptoms. Baseline: See objective data Goal status: Ongoing  06/06/2023   2. Patient will demonstrate  independent use of home exercise program to facilitate ability to maintain/progress functional gains from skilled physical  therapy services. Baseline: See objective data Goal status: Ongoing 06/06/2023  3.  Patient reports Patient-Specific Activity Score improved the average by 3 to indicate improvement in functional activities.  Baseline: SEE OBJECTIVE DATA Goal status: Ongoing  06/06/2023   4.  Timed Up & Go cognitive <20 sec. Baseline: See objective data Goal status: Ongoing  06/06/2023   5.  Patient reports no fall in last 2 weeks.  Baseline: See objective data Goal status: Ongoing  06/06/2023   PLAN:  PT FREQUENCY:  2x/week  PT DURATION: 8 weeks  PLANNED INTERVENTIONS: 97164- PT Re-evaluation, 97110-Therapeutic exercises, 97530- Therapeutic activity, 97112- Neuromuscular re-education, 97535- Self Care, 16109- Manual therapy, [phone]- Gait training, [phone]- Canalith repositioning, Patient/Family education, Balance training, Stair training, Taping, Dry Needling, Joint mobilization, Vestibular training, DME instructions, and Physical Performance Testing  PLAN FOR NEXT SESSION: do 10th visit progress note,     continue with functional strengthening and therapeutic activities to improve standing balance.   Continue to work on range and strength for hips, knees.    Lorie Rook, PT, DPT 06/08/2023, 4:17 PM

## 2023-06-13 ENCOUNTER — Encounter: Payer: Self-pay | Admitting: Physical Therapy

## 2023-06-13 ENCOUNTER — Ambulatory Visit (INDEPENDENT_AMBULATORY_CARE_PROVIDER_SITE_OTHER): Admitting: Physical Therapy

## 2023-06-13 DIAGNOSIS — G8929 Other chronic pain: Secondary | ICD-10-CM

## 2023-06-13 DIAGNOSIS — R2689 Other abnormalities of gait and mobility: Secondary | ICD-10-CM

## 2023-06-13 DIAGNOSIS — Z9181 History of falling: Secondary | ICD-10-CM | POA: Diagnosis not present

## 2023-06-13 DIAGNOSIS — R2681 Unsteadiness on feet: Secondary | ICD-10-CM | POA: Diagnosis not present

## 2023-06-13 DIAGNOSIS — M25562 Pain in left knee: Secondary | ICD-10-CM

## 2023-06-13 DIAGNOSIS — M25561 Pain in right knee: Secondary | ICD-10-CM | POA: Diagnosis not present

## 2023-06-13 DIAGNOSIS — R293 Abnormal posture: Secondary | ICD-10-CM

## 2023-06-13 NOTE — Therapy (Signed)
 OUTPATIENT PHYSICAL THERAPY LOWER EXTREMITY TREATMENT/PROGRESS NOTE Progress Note reporting period  05/09/23 to 06/13/23  See below for objective and subjective measurements relating to patients progress with PT.    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 2011- no further tx. now- Dr. Kendra Pavy , Dr. Ike Malady   Cyst of finger 11/2011   annular cyst right long finger   Dental crowns present    Dermatitis    Frequency of urination    GERD (gastroesophageal reflux disease)    Hemorrhoid    History of colon polyps 2009   Also noted 2012 and 2017 by colonoscopy.   History of shingles 1971   Had right ischial recurrence in the March 2019   Hyperlipidemia    On atorvastatin    Hypertension    under control, has been on med. x 2 yrs.   Liposarcoma of left shoulder (HCC) 1969   Parkinson's disease (HCC)    With mild tremor (neurologist Dr. Salli Crawley), neurosurgeon Dr. Larrie Po   PONV (postoperative nausea and vomiting)    Seasonal allergies    Shingles of eyelid 1971   Right eyelid; recurrent -> last episode 05/11/2017   Tremors of nervous system    hands-essential tremor; associated with Parkinson's.   Trigger finger of right hand 11/2011   long finger   Past  Surgical History:  Procedure Laterality Date   14-day Zio Patch Monitor  08/2020   (report to be scanned): Predominant SR w/ HR 61 to 142 bpm and average 86 bpm.  Total of 59 episodes of PAT/PSVT  4 to 15 beats (not noted on diary).  Fastest was 5 beats at a rate of 193 bpm, longest was 15 beats at a rate of 106 bpm.  Rare isolated PACs (as well as couplets and triplets) with rare isolated PVCs.  No sustained arrhythmias or bradycardia to explain syncope.   ANTERIOR CERVICAL DECOMPRESSION/DISCECTOMY FUSION 4 LEVELS N/A 11/14/2013   Procedure: ANTERIOR CERVICAL DECOMPRESSION/DISCECTOMY FUSION 4 LEVELS;  Surgeon: Garry Kansas, MD;  Location: [facility];  Service: Neurosurgery;  Laterality: N/A;  C34 C45 C56 C67 anterior cervical fusion with interbody prosthesis plating and  bonegraft   APPENDECTOMY  age 35   BREAST LUMPECTOMY  06/16/2009   left; SLN bx.   BREAST LUMPECTOMY  07/01/2009   re-excision   BREAST SURGERY  02/22/1997   reduction   CATARACT EXTRACTION, BILATERAL     COLONOSCOPY WITH PROPOFOL  N/A 12/03/2014   Procedure: COLONOSCOPY WITH PROPOFOL ;  Surgeon: Garrett Kallman, MD;  Location: [facility];  Service: Endoscopy;  Laterality: N/A;  FOOT SURGERY  06/22/2011   left   KNEE ARTHROSCOPY  03/12/2005   right   KNEE ARTHROSCOPY     left   Lower Extremity Venous Dopplers  12/12/2020   No DVT bilaterally in the deep veins or superficial veins.  No deep or superficial venous reflux noted bilaterally with exception of Right SSV at the knee.   NASAL SINUS SURGERY     x 2   NEPHRECTOMY LIVING DONOR Left 04/22/2008   donated to spouse 2010([facility])   TRANSTHORACIC ECHOCARDIOGRAM  12/10/2020   EF 60 to 65%.  Normal LV size and function.  No heart WMA.  GR 1 DD.  Normal RV, RVP and RAP.Aaron Aas  Normal valves.   TRIGGER FINGER RELEASE  12/16/2011   Procedure: RELEASE TRIGGER FINGER/A-1 PULLEY;  Surgeon: Milagros Alf, MD;  Location: [facility];  Service: Orthopedics;   Laterality: Right;  RIGHT LONG FINGER TRIGGER RELEASE & ANNULAR CYST EXCISION   TUMOR EXCISION  age 34   right arm   Patient Active Problem List   Diagnosis Date Noted   Rectal bleeding 07/27/2022   Hemorrhoids 07/27/2022   Chronic pain of both knees 06/21/2022   Moderate persistent asthma without complication 03/27/2021   PSVT (paroxysmal supraventricular tachycardia) (HCC) 11/26/2020   Syncope and collapse 11/24/2020   Primary osteoarthritis of right knee 05/12/2020   Primary osteoarthritis of left knee 05/12/2020   Hyperlipidemia    Bilateral calf pain 03/05/2019   Genetic testing 02/13/2019   Family history of ovarian cancer    Family history of bladder cancer    Family history of colon cancer    Family history of leukemia    Lumbar radiculopathy 12/08/2018   Myofascial pain 12/08/2018   Impaired gait and mobility 12/08/2018   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   Chronic nonallergic rhinitis 12/29/2016   Mild persistent asthma, uncomplicated 12/29/2016   Vocal fold paralysis, 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   Gastroesophageal reflux disease 12/29/2016   Cervical spondylosis with myelopathy and radiculopathy 11/14/2013   Essential tremor 06/13/2013   Cervical spondylosis without myelopathy 06/13/2013   Breast cancer of lower-outer quadrant of left female breast (HCC) 09/10/2010    PCP: Arva Lathe, MD  REFERRING PROVIDER: Shauna Del, MD  REFERRING DIAG: M17.0 (ICD-10-CM) - Bilateral primary osteoarthritis of knee   THERAPY DIAG:  Unsteadiness on feet  Abnormal posture  Chronic pain of both knees  Other abnormalities of gait and mobility  History of falling  Rationale for Evaluation and Treatment: Rehabilitation  ONSET DATE: 04/28/2023 MD referral to PT  SUBJECTIVE:   SUBJECTIVE STATEMENT: She relays still about 5/10 pain and difficulty with walking and stairs.  PERTINENT HISTORY: Cervical Decompression surgery 2015, Supraventicular tachycardia, syncope,  OA, breast CA, Donated right kidney to her husband, HTN, Parkinson's Disease with slight tremor,   PAIN:  NPRS scale: today 0/10 at rest & 4/10 moving and since last PT 0/10 - 5/10 Pain location: knees RLE>LLE more medially Pain description: bad ache Aggravating factors: walking,  standing more than few minutes Relieving factors: injections,  tylenol  takes edge off,  sleeping with pillow bw knees.   PRECAUTIONS: Fall  WEIGHT BEARING RESTRICTIONS: No  FALLS:  Has patient fallen in last 6 months? Yes. Number of falls 3 with knot on head,   LIVING ENVIRONMENT: Lives with: lives alone Lives in: Townhouse 2-story with master bedroom & bathroom downstairs,  Stairs: Yes: Internal: 14-15 steps; on right going up and on left going up and External: 4 steps; can reach  both Has following equipment at home: Single point cane, Walker - 4 wheeled, shower chair, and Grab bars  OCCUPATION: retired  PLOF: Independent  PATIENT GOALS:  learn exercises to function better and with less pain.   Next MD visit:  OBJECTIVE:  DIAGNOSTIC FINDINGS: Radiographs show evidence of joint space narrowing, osteophytes, subchondral sclerosis and/or subchondral cysts  Patient-Specific Activity Scoring Scheme  "0" represents "unable to perform." "10" represents "able to perform at prior level. 0 1 2 3 4 5 6 7 8 9  10 (Date and Score)   Activity Eval  06/06/23  06/13/23  1. Walk around house  5  7 7   2. Stairs   2 (pt changed rating)  5 5  3. Walking in community 3 5 6   4.     5.     Score 4.33 5.67 6   Total score = sum of the activity scores/number of activities Minimum detectable change (90%CI) for average score = 2 points Minimum detectable change (90%CI) for single activity score = 3 points  COGNITION: Overall cognitive status: WFL   POSTURE: rounded shoulders, forward head, and flexed trunk   PALPATION: Tenderness bilateral knees medially  LOWER EXTREMITY ROM:   ROM Right eval Left eval  Right 05/25/23 Left 05/25/23 Right/Left 06/08/23  Hip flexion       Hip extension Standing -11* Standing -12*     Hip abduction       Hip adduction       Hip internal rotation       Hip external rotation       Knee flexion seated A: 104* Seated A: 115*     Knee extension Seated LAQ: -8* Seated LAQ -9* Seated LAQ -4 Seated LAQ -6 Seated LAQ -3/-5  Ankle dorsiflexion       Ankle plantarflexion       Ankle inversion       Ankle eversion        (Blank rows = not tested)  LOWER EXTREMITY MMT:  MMT Right eval Left eval Right 06/13/23 Left 06/13/23  Hip flexion      Hip extension      Hip abduction      Hip adduction      Hip internal rotation      Hip external rotation      Knee flexion seated 4/5 Seated 4/5 5 5   Knee extension 4/5 4/5 5 5   Ankle dorsiflexion 5/5 5/5 5 5   Ankle plantarflexion      Ankle inversion      Ankle eversion       (Blank rows = not tested)  FUNCTIONAL TESTS:  05/09/2023 Evaluation: 18 inch chair transfer: Requires use of single upper extremity on armrest to arise from an 18 inch chair Timed Up and Go with single-point cane: Standard 19.59 seconds and cognitive 30.40 seconds (55% increase) with need to stop naming with turn and limited ability to restart.  Both test indicate high risk of falls especially when distracted.  05/30/23: TUG time with cane 14:37 sec  TUG time without cane 12:88  06/13/23  Cognitive TUG with cane 23 sec counting by 4's  Cognitive TUG with cane 24 sec counting by 3'3  GAIT: 06/08/2023: Gait Velocity with cane self-selected  1.93 ft/sec and fast pace 2.25  ft/sec  No device self-selected  2.19 ft/sec  05/09/2023 Evaluation: Distance walked: 100' Assistive device utilized: Single point cane Level of assistance: SBA Comments: Patient has forward trunk lean, hip and knee flexion  in stance, poor clearance in swing and decreased arm swing.   TODAY'S  TREATMENT                                                                           DATE: 06/13/2023:  Therapeutic Exercise:  Nustep seat 7 BUE/BLE L5 for 10 min  Therapeutic Activities: Cognitive TUG test  X2, see above for details Assessed and measured PT functional goals Leg press BLEs 68# 10 reps 2 sets;  single leg 31# 15 reps ea LE Alternating step taps onto 4 inch step X 10 bilat without UE support.  .  Placing foot directly on cane that was laying on the floor and stepping contralateral lower extremity forward and back over the cane.  Activity set to work on balance when she is on uneven terrain like to run her under a door frame.  Patient performed 10 reps for leg and improved with the activity.  06/08/2023:  Therapeutic Exercise:  Nustep seat 7 BUE/BLE L6 for 4 min then BLEs only level 4 for 4 min Hamstring stretch seated with cane DF 30 sec hold 2 reps BLEs  Therapeutic Activities: Leg press BLEs 68# 10 reps 2 sets;  single leg 31# 10 reps ea LE Standing TKE (green Theraband) step up on 4" step with weight shift over limb for stance and stepping contralateral LE from terminal stance to tapping 6" cone anteriorly BUE support //bars 10 reps ea LE.  PT tactile & verbal cues on stance LE ext.   Standing side step up on 4" step with weight shift over limb for stance and stepping contralateral LE from terminal stance to tapping 6" cone anteriorly BUE support //bars 10 reps ea LE. Standing alternating LEs red theraband figure 8 above knees with PT tactile & verbal cues on stance LE ext - 10 reps ea abduction & extension.   Bilateral heel raises with BUE support 10 reps Stepping over cane on floor in front of toes - holding step position with cues on LE ext without UE support - 10 sec hold 5 reps ea LE in front.  Initially required minA for balance progressed to SBA.  Placing foot directly on cane that was laying on the floor and stepping contralateral lower extremity forward and back over the cane.  Activity set to work on balance when she is on uneven terrain  like to run her under a door frame.  Patient performed 10 reps for leg and improved with the activity.    PATIENT EDUCATION:  Education details: HEP, POC Person educated: Patient Education method: Programmer, multimedia, Demonstration, Verbal cues, and Handouts Education comprehension: verbalized understanding, returned demonstration, and verbal cues required  HOME EXERCISE PROGRAM: Access Code: [email or web address] URL: [email or web address] Date: 05/11/2023 Prepared by: Lorie Rook  Exercises - Seated Long Arc Quad  - 1 x daily - 7 x weekly - 2 sets - 10 reps - 5 seconds hold - Seated Hamstring Stretch with Strap  - 1 x daily - 7 x weekly - 1 sets - 3 reps - 30 seconds hold - sit to stand to sit with counter  - 1 x daily - 7 x weekly - 2 sets - 10 reps - 5 seconds hold - squat  at sink with chair behind you  - 1 x daily - 7 x weekly - 1 sets - 10 reps - 5 seconds hold - Modified [name] Stretch  - 1 x daily - 7 x weekly - 1 sets - 3 reps - 30 seconds hold - Supine Bridge  - 1 x daily - 7 x weekly - 2 sets - 10 reps - 5 seconds hold  ASSESSMENT: CLINICAL IMPRESSION: 10th visit progress note shows she has made some progress but has not met all her goals. Her leg strength showed good improvement today. Gait speed has improved but when cognitive component also added in she decreased her speed a lot and did not meet goal for this. She has met 2/2 short term PT goals and 2/5 long term PT goals. PT recommending to continue with current PT plan to work toward remaining goals.  OBJECTIVE IMPAIRMENTS: Abnormal gait, decreased activity tolerance, decreased balance, decreased coordination, decreased endurance, decreased knowledge of condition, decreased knowledge of use of DME, decreased mobility, decreased ROM, decreased strength, dizziness, impaired flexibility, postural dysfunction, and pain.   ACTIVITY LIMITATIONS: carrying, lifting, standing, stairs, transfers, and locomotion level  PARTICIPATION  LIMITATIONS: meal prep, cleaning, and community activity  PERSONAL FACTORS: Age, Fitness, Past/current experiences, and 3+ comorbidities: see PMH  are also affecting patient's functional outcome.   REHAB POTENTIAL: Good  CLINICAL DECISION MAKING: Evolving/moderate complexity  EVALUATION COMPLEXITY: Moderate   GOALS: Goals reviewed with patient? Yes  SHORT TERM GOALS: (target date for Short term goals 06/03/2023)   1.  Patient will demonstrate independent use of home exercise program to maintain progress from in clinic treatments. Baseline: See objective data Goal status: MET 05/23/23  2. Timed Up & Go <15 sec with cane Baseline: See objective data Goal status: MET 05/30/23  LONG TERM GOALS: (target dates for all long term goals  07/01/2023 )   1. Patient will demonstrate/report bilateral pain at worst less than or equal to 4/10 to facilitate minimal limitation in daily activity secondary to pain symptoms. Baseline: See objective data Goal status: ongoing, 5/10  06/13/2023   2. Patient will demonstrate independent use of home exercise program to facilitate ability to maintain/progress functional gains from skilled physical therapy services. Baseline: See objective data Goal status: MET 06/13/23  3.  Patient reports Patient-Specific Activity Score improved the average by 3 to indicate improvement in functional activities.  Baseline: SEE OBJECTIVE DATA Goal status: Ongoing  06/13/2023, has improved by 2.6 points now   4.  Timed Up & Go cognitive <20 sec. Baseline: See objective data Goal status: Ongoing  06/13/2023, 23 sec at best when tested   5.  Patient reports no fall in last 2 weeks.  Baseline: See objective data Goal status: met 06/13/23   PLAN:  PT FREQUENCY:  2x/week  PT DURATION: 8 weeks  PLANNED INTERVENTIONS: 97164- PT Re-evaluation, 97110-Therapeutic exercises, 97530- Therapeutic activity, 97112- Neuromuscular re-education, 97535- Self Care, 16109- Manual therapy,  [phone]- Gait training, [phone]- Canalith repositioning, Patient/Family education, Balance training, Stair training, Taping, Dry Needling, Joint mobilization, Vestibular training, DME instructions, and Physical Performance Testing  PLAN FOR NEXT SESSION: continue with functional strengthening and therapeutic activities to improve standing balance.   Continue to work on range and strength for hips, knees.    Mick Alamin, PT, DPT 06/13/2023, 1:26 PM

## 2023-06-14 ENCOUNTER — Encounter: Admitting: Physical Therapy

## 2023-06-15 ENCOUNTER — Encounter: Admitting: Physical Therapy

## 2023-06-17 DIAGNOSIS — I10 Essential (primary) hypertension: Secondary | ICD-10-CM | POA: Diagnosis not present

## 2023-06-17 DIAGNOSIS — J45909 Unspecified asthma, uncomplicated: Secondary | ICD-10-CM | POA: Diagnosis not present

## 2023-06-17 DIAGNOSIS — C50512 Malignant neoplasm of lower-outer quadrant of left female breast: Secondary | ICD-10-CM | POA: Diagnosis not present

## 2023-06-17 DIAGNOSIS — M4726 Other spondylosis with radiculopathy, lumbar region: Secondary | ICD-10-CM | POA: Diagnosis not present

## 2023-06-17 DIAGNOSIS — Z1331 Encounter for screening for depression: Secondary | ICD-10-CM | POA: Diagnosis not present

## 2023-06-17 DIAGNOSIS — G20A1 Parkinson's disease without dyskinesia, without mention of fluctuations: Secondary | ICD-10-CM | POA: Diagnosis not present

## 2023-06-17 DIAGNOSIS — R413 Other amnesia: Secondary | ICD-10-CM | POA: Diagnosis not present

## 2023-06-17 DIAGNOSIS — Z0000 Encounter for general adult medical examination without abnormal findings: Secondary | ICD-10-CM | POA: Diagnosis not present

## 2023-06-17 DIAGNOSIS — F3341 Major depressive disorder, recurrent, in partial remission: Secondary | ICD-10-CM | POA: Diagnosis not present

## 2023-06-17 DIAGNOSIS — E785 Hyperlipidemia, unspecified: Secondary | ICD-10-CM | POA: Diagnosis not present

## 2023-06-17 DIAGNOSIS — R2681 Unsteadiness on feet: Secondary | ICD-10-CM | POA: Diagnosis not present

## 2023-06-21 ENCOUNTER — Encounter: Payer: Self-pay | Admitting: Physical Therapy

## 2023-06-21 ENCOUNTER — Ambulatory Visit (INDEPENDENT_AMBULATORY_CARE_PROVIDER_SITE_OTHER): Admitting: Physical Therapy

## 2023-06-21 DIAGNOSIS — R2681 Unsteadiness on feet: Secondary | ICD-10-CM | POA: Diagnosis not present

## 2023-06-21 DIAGNOSIS — G8929 Other chronic pain: Secondary | ICD-10-CM

## 2023-06-21 DIAGNOSIS — R293 Abnormal posture: Secondary | ICD-10-CM

## 2023-06-21 DIAGNOSIS — R2689 Other abnormalities of gait and mobility: Secondary | ICD-10-CM

## 2023-06-21 DIAGNOSIS — M6281 Muscle weakness (generalized): Secondary | ICD-10-CM

## 2023-06-21 DIAGNOSIS — M25561 Pain in right knee: Secondary | ICD-10-CM

## 2023-06-21 DIAGNOSIS — Z9181 History of falling: Secondary | ICD-10-CM | POA: Diagnosis not present

## 2023-06-21 DIAGNOSIS — M25562 Pain in left knee: Secondary | ICD-10-CM

## 2023-06-21 NOTE — Therapy (Signed)
 OUTPATIENT PHYSICAL THERAPY LOWER EXTREMITY TREATMENT/PROGRESS NOTE Progress Note reporting period  05/09/23 to 06/13/23  See below for objective and subjective measurements relating to patients progress with PT.    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 2011- no further tx. now- Dr. Kendra Pavy , Dr. Ike Malady   Cyst of finger 11/2011   annular cyst right long finger   Dental crowns present    Dermatitis    Frequency of urination    GERD (gastroesophageal reflux disease)    Hemorrhoid    History of colon polyps 2009   Also noted 2012 and 2017 by colonoscopy.   History of shingles 1971   Had right ischial recurrence in the March 2019   Hyperlipidemia    On atorvastatin    Hypertension    under control, has been on med. x 2 yrs.   Liposarcoma of left shoulder (HCC) 1969   Parkinson's disease (HCC)    With mild tremor (neurologist Dr. Salli Crawley), neurosurgeon Dr. Larrie Po   PONV (postoperative nausea and vomiting)    Seasonal allergies    Shingles of eyelid 1971   Right eyelid; recurrent -> last episode 05/11/2017   Tremors of nervous system    hands-essential tremor; associated with Parkinson's.   Trigger finger of right hand 11/2011   long finger   Past  Surgical History:  Procedure Laterality Date   14-day Zio Patch Monitor  08/2020   (report to be scanned): Predominant SR w/ HR 61 to 142 bpm and average 86 bpm.  Total of 59 episodes of PAT/PSVT  4 to 15 beats (not noted on diary).  Fastest was 5 beats at a rate of 193 bpm, longest was 15 beats at a rate of 106 bpm.  Rare isolated PACs (as well as couplets and triplets) with rare isolated PVCs.  No sustained arrhythmias or bradycardia to explain syncope.   ANTERIOR CERVICAL DECOMPRESSION/DISCECTOMY FUSION 4 LEVELS N/A 11/14/2013   Procedure: ANTERIOR CERVICAL DECOMPRESSION/DISCECTOMY FUSION 4 LEVELS;  Surgeon: Garry Kansas, MD;  Location: [facility];  Service: Neurosurgery;  Laterality: N/A;  C34 C45 C56 C67 anterior cervical fusion with interbody prosthesis plating and  bonegraft   APPENDECTOMY  age 6   BREAST LUMPECTOMY  06/16/2009   left; SLN bx.   BREAST LUMPECTOMY  07/01/2009   re-excision   BREAST SURGERY  02/22/1997   reduction   CATARACT EXTRACTION, BILATERAL     COLONOSCOPY WITH PROPOFOL  N/A 12/03/2014   Procedure: COLONOSCOPY WITH PROPOFOL ;  Surgeon: Garrett Kallman, MD;  Location: [facility];  Service: Endoscopy;  Laterality: N/A;  FOOT SURGERY  06/22/2011   left   KNEE ARTHROSCOPY  03/12/2005   right   KNEE ARTHROSCOPY     left   Lower Extremity Venous Dopplers  12/12/2020   No DVT bilaterally in the deep veins or superficial veins.  No deep or superficial venous reflux noted bilaterally with exception of Right SSV at the knee.   NASAL SINUS SURGERY     x 2   NEPHRECTOMY LIVING DONOR Left 04/22/2008   donated to spouse 2010([facility])   TRANSTHORACIC ECHOCARDIOGRAM  12/10/2020   EF 60 to 65%.  Normal LV size and function.  No heart WMA.  GR 1 DD.  Normal RV, RVP and RAP.Aaron Aas  Normal valves.   TRIGGER FINGER RELEASE  12/16/2011   Procedure: RELEASE TRIGGER FINGER/A-1 PULLEY;  Surgeon: Milagros Alf, MD;  Location: [facility];  Service: Orthopedics;   Laterality: Right;  RIGHT LONG FINGER TRIGGER RELEASE & ANNULAR CYST EXCISION   TUMOR EXCISION  age 24   right arm   Patient Active Problem List   Diagnosis Date Noted   Rectal bleeding 07/27/2022   Hemorrhoids 07/27/2022   Chronic pain of both knees 06/21/2022   Moderate persistent asthma without complication 03/27/2021   PSVT (paroxysmal supraventricular tachycardia) (HCC) 11/26/2020   Syncope and collapse 11/24/2020   Primary osteoarthritis of right knee 05/12/2020   Primary osteoarthritis of left knee 05/12/2020   Hyperlipidemia    Bilateral calf pain 03/05/2019   Genetic testing 02/13/2019   Family history of ovarian cancer    Family history of bladder cancer    Family history of colon cancer    Family history of leukemia    Lumbar radiculopathy 12/08/2018   Myofascial pain 12/08/2018   Impaired gait and mobility 12/08/2018   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   Chronic nonallergic rhinitis 12/29/2016   Mild persistent asthma, uncomplicated 12/29/2016   Vocal fold paralysis, 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   Gastroesophageal reflux disease 12/29/2016   Cervical spondylosis with myelopathy and radiculopathy 11/14/2013   Essential tremor 06/13/2013   Cervical spondylosis without myelopathy 06/13/2013   Breast cancer of lower-outer quadrant of left female breast (HCC) 09/10/2010    PCP: Arva Lathe, MD  REFERRING PROVIDER: Shauna Del, MD  REFERRING DIAG: M17.0 (ICD-10-CM) - Bilateral primary osteoarthritis of knee   THERAPY DIAG:  Unsteadiness on feet  Abnormal posture  Chronic pain of both knees  Other abnormalities of gait and mobility  History of falling  Muscle weakness (generalized)  Rationale for Evaluation and Treatment: Rehabilitation  ONSET DATE: 04/28/2023 MD referral to PT  SUBJECTIVE:   SUBJECTIVE STATEMENT: Pt reporting bil knee pain, with Rt knee 5/10. Pt stating her left knee wasn't bothering her too much today.   PERTINENT  HISTORY: Cervical Decompression surgery 2015, Supraventicular tachycardia, syncope, OA, breast CA, Donated right kidney to her husband, HTN, Parkinson's Disease with slight tremor,   PAIN:  NPRS scale: today 5/10 in Rt knee, 1-2/10 in left knee.  Pain location: knees RLE>LLE more medially Pain description: bad ache Aggravating factors: walking,  standing more than few minutes Relieving factors: injections,  tylenol  takes edge off,  sleeping with pillow bw knees.   PRECAUTIONS: Fall  WEIGHT BEARING RESTRICTIONS: No  FALLS:  Has patient fallen in last 6 months? Yes. Number of falls 3 with knot on head,   LIVING ENVIRONMENT: Lives with: lives alone Lives in: Townhouse 2-story with master bedroom & bathroom downstairs,  Stairs: Yes: Internal: 14-15 steps; on right going up and on  left going up and External: 4 steps; can reach both Has following equipment at home: Single point cane, Walker - 4 wheeled, shower chair, and Grab bars  OCCUPATION: retired  PLOF: Independent  PATIENT GOALS:  learn exercises to function better and with less pain.   Next MD visit:  OBJECTIVE:  DIAGNOSTIC FINDINGS: Radiographs show evidence of joint space narrowing, osteophytes, subchondral sclerosis and/or subchondral cysts  Patient-Specific Activity Scoring Scheme  "0" represents "unable to perform." "10" represents "able to perform at prior level. 0 1 2 3 4 5 6 7 8 9  10 (Date and Score)   Activity Eval  06/06/23  06/13/23  1. Walk around house  5  7 7   2. Stairs   2 (pt changed rating)  5 5  3. Walking in community 3 5 6   4.     5.     Score 4.33 5.67 6   Total score = sum of the activity scores/number of activities Minimum detectable change (90%CI) for average score = 2 points Minimum detectable change (90%CI) for single activity score = 3 points  COGNITION: Overall cognitive status: WFL   POSTURE: rounded shoulders, forward head, and flexed trunk   PALPATION: Tenderness bilateral knees  medially  LOWER EXTREMITY ROM:   ROM Right eval Left eval Right 05/25/23 Left 05/25/23 Right/Left 06/08/23  Hip flexion       Hip extension Standing -11* Standing -12*     Hip abduction       Hip adduction       Hip internal rotation       Hip external rotation       Knee flexion seated A: 104* Seated A: 115*     Knee extension Seated LAQ: -8* Seated LAQ -9* Seated LAQ -4 Seated LAQ -6 Seated LAQ -3/-5  Ankle dorsiflexion       Ankle plantarflexion       Ankle inversion       Ankle eversion        (Blank rows = not tested)  LOWER EXTREMITY MMT:  MMT Right eval Left eval Right 06/13/23 Left 06/13/23  Hip flexion      Hip extension      Hip abduction      Hip adduction      Hip internal rotation      Hip external rotation      Knee flexion seated 4/5 Seated 4/5 5 5   Knee extension 4/5 4/5 5 5   Ankle dorsiflexion 5/5 5/5 5 5   Ankle plantarflexion      Ankle inversion      Ankle eversion       (Blank rows = not tested)  FUNCTIONAL TESTS:  05/09/2023 Evaluation: 18 inch chair transfer: Requires use of single upper extremity on armrest to arise from an 18 inch chair Timed Up and Go with single-point cane: Standard 19.59 seconds and cognitive 30.40 seconds (55% increase) with need to stop naming with turn and limited ability to restart.  Both test indicate high risk of falls especially when distracted.  05/30/23: TUG time with cane 14:37 sec  TUG time without cane 12:88  06/13/23  Cognitive TUG with cane 23 sec counting by 4's  Cognitive TUG with cane 24 sec counting by 3'3  GAIT: 06/08/2023: Gait Velocity with cane self-selected  1.93 ft/sec and fast pace 2.25  ft/sec  No device self-selected  2.19 ft/sec  05/09/2023 Evaluation: Distance walked: 100' Assistive device utilized: Single point cane Level of assistance: SBA Comments:  Patient has forward trunk lean, hip and knee flexion in stance, poor clearance in swing and decreased arm swing.   TODAY'S   TREATMENT                                                                          DATE: 06/21/23:  Therapeutic Exercise:  Nustep seat 7 BUE/BLE L5 for 10 min LAQ x 10 holding 3-5 seconds bil LE Hamstring curls x 15 bil  NMR:  Heel taps on 4 inch step x 20 alternating  Toe taps on 4 inch step x 20 alternating Fitter first 2 point rocker board: rocking side to side x 2 minutes c close supervision to CGA, rocking back and forth x 2 minutes c close supervision to CGA (each c UE support bilaterally) Standing on level surface, UE reaching up/down/cross body, side to side tapping therapist's hand in varying positions 2 x 10 bil UE's Therapeutic Activities: Side stepping: c UE support in parallel bars x 4  Standing marching x 20 c UE support Stepping over theraband strips on the floor x 4 for 30 feet x 2     06/13/2023:  Therapeutic Exercise:  Nustep seat 7 BUE/BLE L5 for 10 min  Therapeutic Activities: Cognitive TUG test  X2, see above for details Assessed and measured PT functional goals Leg press BLEs 68# 10 reps 2 sets;  single leg 31# 15 reps ea LE Alternating step taps onto 4 inch step X 10 bilat without UE support.  .  Placing foot directly on cane that was laying on the floor and stepping contralateral lower extremity forward and back over the cane.  Activity set to work on balance when she is on uneven terrain like to run her under a door frame.  Patient performed 10 reps for leg and improved with the activity.    06/08/2023:  Therapeutic Exercise:  Nustep seat 7 BUE/BLE L6 for 4 min then BLEs only level 4 for 4 min Hamstring stretch seated with cane DF 30 sec hold 2 reps BLEs  Therapeutic Activities: Leg press BLEs 68# 10 reps 2 sets;  single leg 31# 10 reps ea LE Standing TKE (green Theraband) step up on 4" step with weight shift over limb for stance and stepping contralateral LE from terminal stance to tapping 6" cone anteriorly BUE support //bars 10 reps ea LE.  PT tactile &  verbal cues on stance LE ext.   Standing side step up on 4" step with weight shift over limb for stance and stepping contralateral LE from terminal stance to tapping 6" cone anteriorly BUE support //bars 10 reps ea LE. Standing alternating LEs red theraband figure 8 above knees with PT tactile & verbal cues on stance LE ext - 10 reps ea abduction & extension.   Bilateral heel raises with BUE support 10 reps Stepping over cane on floor in front of toes - holding step position with cues on LE ext without UE support - 10 sec hold 5 reps ea LE in front.  Initially required minA for balance progressed to SBA.  Placing foot directly on cane that was laying on the floor and stepping contralateral lower extremity forward and back over the cane.  Activity set to work  on balance when she is on uneven terrain like to run her under a door frame.  Patient performed 10 reps for leg and improved with the activity.    PATIENT EDUCATION:  Education details: HEP, POC Person educated: Patient Education method: Programmer, multimedia, Demonstration, Verbal cues, and Handouts Education comprehension: verbalized understanding, returned demonstration, and verbal cues required  HOME EXERCISE PROGRAM: Access Code: [email or web address] URL: [email or web address] Date: 05/11/2023 Prepared by: Lorie Rook  Exercises - Seated Long Arc Quad  - 1 x daily - 7 x weekly - 2 sets - 10 reps - 5 seconds hold - Seated Hamstring Stretch with Strap  - 1 x daily - 7 x weekly - 1 sets - 3 reps - 30 seconds hold - sit to stand to sit with counter  - 1 x daily - 7 x weekly - 2 sets - 10 reps - 5 seconds hold - squat at sink with chair behind you  - 1 x daily - 7 x weekly - 1 sets - 10 reps - 5 seconds hold - Modified [name] Stretch  - 1 x daily - 7 x weekly - 1 sets - 3 reps - 30 seconds hold - Supine Bridge  - 1 x daily - 7 x weekly - 2 sets - 10 reps - 5 seconds hold  ASSESSMENT: CLINICAL IMPRESSION: Treatment today focusing on  functional mobility and balance. Pt tolerating exercises well. Recommending continuation of skilled PT toward LTGs set.   OBJECTIVE IMPAIRMENTS: Abnormal gait, decreased activity tolerance, decreased balance, decreased coordination, decreased endurance, decreased knowledge of condition, decreased knowledge of use of DME, decreased mobility, decreased ROM, decreased strength, dizziness, impaired flexibility, postural dysfunction, and pain.   ACTIVITY LIMITATIONS: carrying, lifting, standing, stairs, transfers, and locomotion level  PARTICIPATION LIMITATIONS: meal prep, cleaning, and community activity  PERSONAL FACTORS: Age, Fitness, Past/current experiences, and 3+ comorbidities: see PMH  are also affecting patient's functional outcome.   REHAB POTENTIAL: Good  CLINICAL DECISION MAKING: Evolving/moderate complexity  EVALUATION COMPLEXITY: Moderate   GOALS: Goals reviewed with patient? Yes  SHORT TERM GOALS: (target date for Short term goals 06/03/2023)   1.  Patient will demonstrate independent use of home exercise program to maintain progress from in clinic treatments. Baseline: See objective data Goal status: MET 05/23/23  2. Timed Up & Go <15 sec with cane Baseline: See objective data Goal status: MET 05/30/23  LONG TERM GOALS: (target dates for all long term goals  07/01/2023 )   1. Patient will demonstrate/report bilateral pain at worst less than or equal to 4/10 to facilitate minimal limitation in daily activity secondary to pain symptoms. Baseline: See objective data Goal status: ongoing, 5/10  06/21/2023   2. Patient will demonstrate independent use of home exercise program to facilitate ability to maintain/progress functional gains from skilled physical therapy services. Baseline: See objective data Goal status: MET 06/13/23  3.  Patient reports Patient-Specific Activity Score improved the average by 3 to indicate improvement in functional activities.  Baseline: SEE  OBJECTIVE DATA Goal status: Ongoing  06/13/2023, has improved by 2.6 points now   4.  Timed Up & Go cognitive <20 sec. Baseline: See objective data Goal status: Ongoing  06/13/2023, 23 sec at best when tested   5.  Patient reports no fall in last 2 weeks.  Baseline: See objective data Goal status: met 06/13/23   PLAN:  PT FREQUENCY:  2x/week  PT DURATION: 8 weeks  PLANNED INTERVENTIONS: 97164- PT Re-evaluation, 97110-Therapeutic exercises, 97530- Therapeutic activity, 97112- Neuromuscular re-education,  16109- Self Care, 60454- Manual therapy, [phone]- Gait training, [phone]- Canalith repositioning, Patient/Family education, Balance training, Stair training, Taping, Dry Needling, Joint mobilization, Vestibular training, DME instructions, and Physical Performance Testing  PLAN FOR NEXT SESSION: continue with functional strengthening and therapeutic activities to improve standing balance.   Continue to work on range and strength for hips, knees.    Marysue Sola, PT, MPT 06/21/2023, 4:03 PM

## 2023-06-22 DIAGNOSIS — G20C Parkinsonism, unspecified: Secondary | ICD-10-CM | POA: Diagnosis not present

## 2023-06-22 DIAGNOSIS — E785 Hyperlipidemia, unspecified: Secondary | ICD-10-CM | POA: Diagnosis not present

## 2023-06-22 DIAGNOSIS — F3341 Major depressive disorder, recurrent, in partial remission: Secondary | ICD-10-CM | POA: Diagnosis not present

## 2023-06-22 DIAGNOSIS — C50512 Malignant neoplasm of lower-outer quadrant of left female breast: Secondary | ICD-10-CM | POA: Diagnosis not present

## 2023-06-22 DIAGNOSIS — I10 Essential (primary) hypertension: Secondary | ICD-10-CM | POA: Diagnosis not present

## 2023-06-24 ENCOUNTER — Ambulatory Visit (INDEPENDENT_AMBULATORY_CARE_PROVIDER_SITE_OTHER): Admitting: Physical Therapy

## 2023-06-24 ENCOUNTER — Encounter: Payer: Self-pay | Admitting: Physical Therapy

## 2023-06-24 DIAGNOSIS — M6281 Muscle weakness (generalized): Secondary | ICD-10-CM

## 2023-06-24 DIAGNOSIS — Z9181 History of falling: Secondary | ICD-10-CM | POA: Diagnosis not present

## 2023-06-24 DIAGNOSIS — R2689 Other abnormalities of gait and mobility: Secondary | ICD-10-CM | POA: Diagnosis not present

## 2023-06-24 DIAGNOSIS — I10 Essential (primary) hypertension: Secondary | ICD-10-CM | POA: Diagnosis not present

## 2023-06-24 DIAGNOSIS — R293 Abnormal posture: Secondary | ICD-10-CM | POA: Diagnosis not present

## 2023-06-24 DIAGNOSIS — M25561 Pain in right knee: Secondary | ICD-10-CM | POA: Diagnosis not present

## 2023-06-24 DIAGNOSIS — G8929 Other chronic pain: Secondary | ICD-10-CM | POA: Diagnosis not present

## 2023-06-24 DIAGNOSIS — R2681 Unsteadiness on feet: Secondary | ICD-10-CM

## 2023-06-24 DIAGNOSIS — M25562 Pain in left knee: Secondary | ICD-10-CM

## 2023-06-24 NOTE — Therapy (Signed)
 OUTPATIENT PHYSICAL THERAPY LOWER EXTREMITY TREATMENT     Patient Name: April Church MRN: [id number] DOB:05/03/1940, 83 y.o., female Today's Date: 06/24/2023  END OF SESSION:  PT End of Session - 06/24/23 1345     Visit Number 12    Number of Visits 16    Date for PT Re-Evaluation 07/01/23    Authorization Type Medicare & BCBS supplement    PT Start Time 1304    PT Stop Time 1345    PT Time Calculation (min) 41 min    Activity Tolerance Patient tolerated treatment well    Behavior During Therapy [facility] for tasks assessed/performed                   Past Medical History:  Diagnosis Date   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 2011- no further tx. now- Dr. Kendra Pavy , Dr. Ike Malady   Cyst of finger 11/2011   annular cyst right long finger   Dental crowns present    Dermatitis    Frequency of urination    GERD (gastroesophageal reflux disease)    Hemorrhoid    History of colon polyps 2009   Also noted 2012 and 2017 by colonoscopy.   History of shingles 1971   Had right ischial recurrence in the March 2019   Hyperlipidemia    On atorvastatin    Hypertension    under control, has been on med. x 2 yrs.   Liposarcoma of left shoulder (HCC) 1969   Parkinson's disease (HCC)    With mild tremor (neurologist Dr. Salli Crawley), neurosurgeon Dr. Larrie Po   PONV (postoperative nausea and vomiting)    Seasonal allergies    Shingles of eyelid 1971   Right eyelid; recurrent -> last episode 05/11/2017   Tremors of nervous system    hands-essential tremor; associated with Parkinson's.   Trigger finger of right hand 11/2011   long finger   Past Surgical History:  Procedure Laterality Date   14-day Zio Patch Monitor  08/2020   (report to be scanned): Predominant SR w/ HR 61 to 142 bpm and average 86 bpm.  Total of 59 episodes of  PAT/PSVT  4 to 15 beats (not noted on diary).  Fastest was 5 beats at a rate of 193 bpm, longest was 15 beats at a rate of 106 bpm.  Rare isolated PACs (as well as couplets and triplets) with rare isolated PVCs.  No sustained arrhythmias or bradycardia to explain syncope.   ANTERIOR CERVICAL DECOMPRESSION/DISCECTOMY FUSION 4 LEVELS N/A 11/14/2013   Procedure: ANTERIOR CERVICAL DECOMPRESSION/DISCECTOMY FUSION 4 LEVELS;  Surgeon: Garry Kansas, MD;  Location: [facility];  Service: Neurosurgery;  Laterality: N/A;  C34 C45 C56 C67 anterior cervical fusion with interbody prosthesis plating and  bonegraft   APPENDECTOMY  age 75   BREAST LUMPECTOMY  06/16/2009   left; SLN bx.   BREAST LUMPECTOMY  07/01/2009   re-excision   BREAST SURGERY  02/22/1997   reduction   CATARACT EXTRACTION, BILATERAL     COLONOSCOPY WITH PROPOFOL  N/A 12/03/2014   Procedure: COLONOSCOPY WITH PROPOFOL ;  Surgeon: Garrett Kallman, MD;  Location: [facility];  Service: Endoscopy;  Laterality: N/A;   FOOT SURGERY  06/22/2011   left   KNEE ARTHROSCOPY  03/12/2005   right   KNEE ARTHROSCOPY     left   Lower  Extremity Venous Dopplers  12/12/2020   No DVT bilaterally in the deep veins or superficial veins.  No deep or superficial venous reflux noted bilaterally with exception of Right SSV at the knee.   NASAL SINUS SURGERY     x 2   NEPHRECTOMY LIVING DONOR Left 04/22/2008   donated to spouse 2010([facility])   TRANSTHORACIC ECHOCARDIOGRAM  12/10/2020   EF 60 to 65%.  Normal LV size and function.  No heart WMA.  GR 1 DD.  Normal RV, RVP and RAP.Aaron Aas  Normal valves.   TRIGGER FINGER RELEASE  12/16/2011   Procedure: RELEASE TRIGGER FINGER/A-1 PULLEY;  Surgeon: Milagros Alf, MD;  Location: [facility];  Service: Orthopedics;  Laterality: Right;  RIGHT LONG FINGER TRIGGER RELEASE & ANNULAR CYST EXCISION   TUMOR EXCISION  age 14   right arm   Patient Active Problem List   Diagnosis Date Noted   Rectal bleeding  07/27/2022   Hemorrhoids 07/27/2022   Chronic pain of both knees 06/21/2022   Moderate persistent asthma without complication 03/27/2021   PSVT (paroxysmal supraventricular tachycardia) (HCC) 11/26/2020   Syncope and collapse 11/24/2020   Primary osteoarthritis of right knee 05/12/2020   Primary osteoarthritis of left knee 05/12/2020   Hyperlipidemia    Bilateral calf pain 03/05/2019   Genetic testing 02/13/2019   Family history of ovarian cancer    Family history of bladder cancer    Family history of colon cancer    Family history of leukemia    Lumbar radiculopathy 12/08/2018   Myofascial pain 12/08/2018   Impaired gait and mobility 12/08/2018   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   Chronic nonallergic rhinitis 12/29/2016   Mild persistent asthma, uncomplicated 12/29/2016   Vocal fold paralysis, 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   Gastroesophageal reflux disease 12/29/2016   Cervical spondylosis with myelopathy and radiculopathy 11/14/2013   Essential tremor 06/13/2013   Cervical spondylosis without myelopathy 06/13/2013   Breast cancer of lower-outer quadrant of left female breast (HCC) 09/10/2010    PCP: Arva Lathe, MD  REFERRING PROVIDER: Shauna Del, MD  REFERRING DIAG: M17.0 (ICD-10-CM) - Bilateral primary osteoarthritis of knee   THERAPY DIAG:  Unsteadiness on feet  Abnormal posture  Chronic pain of both knees  Other abnormalities of gait and mobility  History of falling  Muscle weakness (generalized)  Rationale for Evaluation and Treatment: Rehabilitation  ONSET DATE: 04/28/2023 MD referral to PT  SUBJECTIVE:   SUBJECTIVE STATEMENT:  I have a nustep at home, its lower than the ones here. Use it about 3 times a week. I will have a few days without pain and then it will start to come back.    PERTINENT HISTORY: Cervical Decompression surgery 2015, Supraventicular tachycardia, syncope, OA, breast CA, Donated right kidney to her husband, HTN, Parkinson's  Disease with slight tremor,   PAIN:  NPRS scale: no number given on NPRS "I have knee pain" Pain location: knees RLE>LLE more medially Pain description: bad ache Aggravating factors: walking,  standing more than few minutes Relieving factors: injections,  tylenol  takes edge off,  sleeping with pillow bw knees.   PRECAUTIONS: Fall  WEIGHT BEARING RESTRICTIONS: No  FALLS:  Has patient fallen in last 6 months? Yes. Number of falls 3 with knot on head,   LIVING ENVIRONMENT: Lives with: lives alone Lives in: Townhouse 2-story with master bedroom & bathroom downstairs,  Stairs: Yes: Internal: 14-15 steps; on right going up and on left going up and External: 4 steps; can reach both Has following  equipment at home: Single point cane, Walker - 4 wheeled, shower chair, and Grab bars  OCCUPATION: retired  PLOF: Independent  PATIENT GOALS:  learn exercises to function better and with less pain.   Next MD visit:  OBJECTIVE:  DIAGNOSTIC FINDINGS: Radiographs show evidence of joint space narrowing, osteophytes, subchondral sclerosis and/or subchondral cysts  Patient-Specific Activity Scoring Scheme  "0" represents "unable to perform." "10" represents "able to perform at prior level. 0 1 2 3 4 5 6 7 8 9  10 (Date and Score)   Activity Eval  06/06/23  06/13/23  1. Walk around house  5  7 7   2. Stairs   2 (pt changed rating)  5 5  3. Walking in community 3 5 6   4.     5.     Score 4.33 5.67 6   Total score = sum of the activity scores/number of activities Minimum detectable change (90%CI) for average score = 2 points Minimum detectable change (90%CI) for single activity score = 3 points  COGNITION: Overall cognitive status: WFL   POSTURE: rounded shoulders, forward head, and flexed trunk   PALPATION: Tenderness bilateral knees medially  LOWER EXTREMITY ROM:   ROM Right eval Left eval Right 05/25/23 Left 05/25/23 Right/Left 06/08/23  Hip flexion       Hip extension  Standing -11* Standing -12*     Hip abduction       Hip adduction       Hip internal rotation       Hip external rotation       Knee flexion seated A: 104* Seated A: 115*     Knee extension Seated LAQ: -8* Seated LAQ -9* Seated LAQ -4 Seated LAQ -6 Seated LAQ -3/-5  Ankle dorsiflexion       Ankle plantarflexion       Ankle inversion       Ankle eversion        (Blank rows = not tested)  LOWER EXTREMITY MMT:  MMT Right eval Left eval Right 06/13/23 Left 06/13/23  Hip flexion      Hip extension      Hip abduction      Hip adduction      Hip internal rotation      Hip external rotation      Knee flexion seated 4/5 Seated 4/5 5 5   Knee extension 4/5 4/5 5 5   Ankle dorsiflexion 5/5 5/5 5 5   Ankle plantarflexion      Ankle inversion      Ankle eversion       (Blank rows = not tested)  FUNCTIONAL TESTS:  05/09/2023 Evaluation: 18 inch chair transfer: Requires use of single upper extremity on armrest to arise from an 18 inch chair Timed Up and Go with single-point cane: Standard 19.59 seconds and cognitive 30.40 seconds (55% increase) with need to stop naming with turn and limited ability to restart.  Both test indicate high risk of falls especially when distracted.  05/30/23: TUG time with cane 14:37 sec  TUG time without cane 12:88  06/13/23  Cognitive TUG with cane 23 sec counting by 4's  Cognitive TUG with cane 24 sec counting by 3'3  GAIT: 06/08/2023: Gait Velocity with cane self-selected  1.93 ft/sec and fast pace 2.25  ft/sec  No device self-selected  2.19 ft/sec  05/09/2023 Evaluation: Distance walked: 100' Assistive device utilized: Single point cane Level of assistance: SBA Comments: Patient has forward trunk lean, hip and knee flexion in stance, poor  clearance in swing and decreased arm swing.   TODAY'S  TREATMENT                                                                          DATE:  06/24/23  Nustep L5x10 minutes all four extremities, seat  7 Leg press 68# BLEs x20  LAQS red TB x10 B STS red TB above knees x5- high mat table, lots of difficulty keeping tension on band/avoiding knee valgus while still using good eccentric control   Tandem stance 2x30 seconds B Narrow BOS 2x30 seconds B  Stepping over 4 inch hurdles x2 rounds- lots of cues for foot clearance        06/21/23:  Therapeutic Exercise:  Nustep seat 7 BUE/BLE L5 for 10 min LAQ x 10 holding 3-5 seconds bil LE Hamstring curls x 15 bil  NMR:  Heel taps on 4 inch step x 20 alternating  Toe taps on 4 inch step x 20 alternating Fitter first 2 point rocker board: rocking side to side x 2 minutes c close supervision to CGA, rocking back and forth x 2 minutes c close supervision to CGA (each c UE support bilaterally) Standing on level surface, UE reaching up/down/cross body, side to side tapping therapist's hand in varying positions 2 x 10 bil UE's Therapeutic Activities: Side stepping: c UE support in parallel bars x 4  Standing marching x 20 c UE support Stepping over theraband strips on the floor x 4 for 30 feet x 2     06/13/2023:  Therapeutic Exercise:  Nustep seat 7 BUE/BLE L5 for 10 min  Therapeutic Activities: Cognitive TUG test  X2, see above for details Assessed and measured PT functional goals Leg press BLEs 68# 10 reps 2 sets;  single leg 31# 15 reps ea LE Alternating step taps onto 4 inch step X 10 bilat without UE support.  .  Placing foot directly on cane that was laying on the floor and stepping contralateral lower extremity forward and back over the cane.  Activity set to work on balance when she is on uneven terrain like to run her under a door frame.  Patient performed 10 reps for leg and improved with the activity.    06/08/2023:  Therapeutic Exercise:  Nustep seat 7 BUE/BLE L6 for 4 min then BLEs only level 4 for 4 min Hamstring stretch seated with cane DF 30 sec hold 2 reps BLEs  Therapeutic Activities: Leg press BLEs 68# 10 reps 2  sets;  single leg 31# 10 reps ea LE Standing TKE (green Theraband) step up on 4" step with weight shift over limb for stance and stepping contralateral LE from terminal stance to tapping 6" cone anteriorly BUE support //bars 10 reps ea LE.  PT tactile & verbal cues on stance LE ext.   Standing side step up on 4" step with weight shift over limb for stance and stepping contralateral LE from terminal stance to tapping 6" cone anteriorly BUE support //bars 10 reps ea LE. Standing alternating LEs red theraband figure 8 above knees with PT tactile & verbal cues on stance LE ext - 10 reps ea abduction & extension.   Bilateral heel raises with BUE support 10 reps Stepping  over cane on floor in front of toes - holding step position with cues on LE ext without UE support - 10 sec hold 5 reps ea LE in front.  Initially required minA for balance progressed to SBA.  Placing foot directly on cane that was laying on the floor and stepping contralateral lower extremity forward and back over the cane.  Activity set to work on balance when she is on uneven terrain like to run her under a door frame.  Patient performed 10 reps for leg and improved with the activity.    PATIENT EDUCATION:  Education details: HEP, POC Person educated: Patient Education method: Programmer, multimedia, Demonstration, Verbal cues, and Handouts Education comprehension: verbalized understanding, returned demonstration, and verbal cues required  HOME EXERCISE PROGRAM: Access Code: [email or web address] URL: [email or web address] Date: 05/11/2023 Prepared by: Lorie Rook  Exercises - Seated Long Arc Quad  - 1 x daily - 7 x weekly - 2 sets - 10 reps - 5 seconds hold - Seated Hamstring Stretch with Strap  - 1 x daily - 7 x weekly - 1 sets - 3 reps - 30 seconds hold - sit to stand to sit with counter  - 1 x daily - 7 x weekly - 2 sets - 10 reps - 5 seconds hold - squat at sink with chair behind you  - 1 x daily - 7 x weekly - 1 sets - 10 reps -  5 seconds hold - Modified [name] Stretch  - 1 x daily - 7 x weekly - 1 sets - 3 reps - 30 seconds hold - Supine Bridge  - 1 x daily - 7 x weekly - 2 sets - 10 reps - 5 seconds hold  ASSESSMENT: CLINICAL IMPRESSION:  Pt arrives today, doing well, no significant changes. We continued working on a mix of functional activity tolerance, balance, and strengthening as per POC. Did well, will continue to assess and progress.    OBJECTIVE IMPAIRMENTS: Abnormal gait, decreased activity tolerance, decreased balance, decreased coordination, decreased endurance, decreased knowledge of condition, decreased knowledge of use of DME, decreased mobility, decreased ROM, decreased strength, dizziness, impaired flexibility, postural dysfunction, and pain.   ACTIVITY LIMITATIONS: carrying, lifting, standing, stairs, transfers, and locomotion level  PARTICIPATION LIMITATIONS: meal prep, cleaning, and community activity  PERSONAL FACTORS: Age, Fitness, Past/current experiences, and 3+ comorbidities: see PMH  are also affecting patient's functional outcome.   REHAB POTENTIAL: Good  CLINICAL DECISION MAKING: Evolving/moderate complexity  EVALUATION COMPLEXITY: Moderate   GOALS: Goals reviewed with patient? Yes  SHORT TERM GOALS: (target date for Short term goals 06/03/2023)   1.  Patient will demonstrate independent use of home exercise program to maintain progress from in clinic treatments. Baseline: See objective data Goal status: MET 05/23/23  2. Timed Up & Go <15 sec with cane Baseline: See objective data Goal status: MET 05/30/23  LONG TERM GOALS: (target dates for all long term goals  07/01/2023 )   1. Patient will demonstrate/report bilateral pain at worst less than or equal to 4/10 to facilitate minimal limitation in daily activity secondary to pain symptoms. Baseline: See objective data Goal status: ongoing, 5/10  06/21/2023   2. Patient will demonstrate independent use of home exercise program  to facilitate ability to maintain/progress functional gains from skilled physical therapy services. Baseline: See objective data Goal status: MET 06/13/23  3.  Patient reports Patient-Specific Activity Score improved the average by 3 to indicate improvement in functional activities.  Baseline: SEE OBJECTIVE DATA Goal status: Ongoing  06/13/2023, has improved by 2.6 points now   4.  Timed Up & Go cognitive <20 sec. Baseline: See objective data Goal status: Ongoing  06/13/2023, 23 sec at best when tested   5.  Patient reports no fall in last 2 weeks.  Baseline: See objective data Goal status: met 06/13/23   PLAN:  PT FREQUENCY:  2x/week  PT DURATION: 8 weeks  PLANNED INTERVENTIONS: 97164- PT Re-evaluation, 97110-Therapeutic exercises, 97530- Therapeutic activity, 97112- Neuromuscular re-education, 97535- Self Care, 16109- Manual therapy, [id number]- Gait training, [id number]- Canalith repositioning, Patient/Family education, Balance training, Stair training, Taping, Dry Needling, Joint mobilization, Vestibular training, DME instructions, and Physical Performance Testing  PLAN FOR NEXT SESSION: continue with functional strengthening and therapeutic activities to improve standing balance.  Keep working on range and strength for hips, knees. Work on foot clearance   Terrel Ferries, PT, DPT 06/24/23 1:45 PM

## 2023-06-28 ENCOUNTER — Ambulatory Visit (INDEPENDENT_AMBULATORY_CARE_PROVIDER_SITE_OTHER): Admitting: Physical Therapy

## 2023-06-28 ENCOUNTER — Encounter: Payer: Self-pay | Admitting: Physical Therapy

## 2023-06-28 DIAGNOSIS — R2681 Unsteadiness on feet: Secondary | ICD-10-CM | POA: Diagnosis not present

## 2023-06-28 DIAGNOSIS — Z9181 History of falling: Secondary | ICD-10-CM | POA: Diagnosis not present

## 2023-06-28 DIAGNOSIS — R293 Abnormal posture: Secondary | ICD-10-CM

## 2023-06-28 DIAGNOSIS — G8929 Other chronic pain: Secondary | ICD-10-CM | POA: Diagnosis not present

## 2023-06-28 DIAGNOSIS — R2689 Other abnormalities of gait and mobility: Secondary | ICD-10-CM

## 2023-06-28 DIAGNOSIS — M25561 Pain in right knee: Secondary | ICD-10-CM

## 2023-06-28 DIAGNOSIS — M25562 Pain in left knee: Secondary | ICD-10-CM

## 2023-06-28 DIAGNOSIS — M6281 Muscle weakness (generalized): Secondary | ICD-10-CM

## 2023-06-28 NOTE — Therapy (Signed)
 OUTPATIENT PHYSICAL THERAPY LOWER EXTREMITY  DISCHARGE     Patient Name: April Church MRN: [id number] DOB:01/26/1941, 83 y.o., female Today's Date: 06/28/2023  END OF SESSION:  PT End of Session - 06/28/23 1339     Visit Number 13    Number of Visits 16    Date for PT Re-Evaluation 07/01/23    Authorization Type Medicare & BCBS supplement    Progress Note Due on Visit 10    PT Start Time 1300    PT Stop Time 1342    PT Time Calculation (min) 42 min    Activity Tolerance Patient tolerated treatment well    Behavior During Therapy 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 2011- no further tx. now- Dr. Kendra Pavy , Dr. Ike Malady   Cyst of finger 11/2011   annular cyst right long finger   Dental crowns present    Dermatitis    Frequency of urination    GERD (gastroesophageal reflux disease)    Hemorrhoid    History of colon polyps 2009   Also noted 2012 and 2017 by colonoscopy.   History of shingles 1971   Had right ischial recurrence in the March 2019   Hyperlipidemia    On atorvastatin    Hypertension    under control, has been on med. x 2 yrs.   Liposarcoma of left shoulder (HCC) 1969   Parkinson's disease (HCC)    With mild tremor (neurologist Dr. Salli Crawley), neurosurgeon Dr. Larrie Po   PONV (postoperative nausea and vomiting)    Seasonal allergies    Shingles of eyelid 1971   Right eyelid; recurrent -> last episode 05/11/2017   Tremors of nervous system    hands-essential tremor; associated with Parkinson's.   Trigger finger of right hand 11/2011   long finger   Past Surgical History:  Procedure Laterality Date   14-day Zio Patch Monitor  08/2020   (report to be scanned): Predominant SR w/ HR 61 to 142 bpm and  average 86 bpm.  Total of 59 episodes of PAT/PSVT  4 to 15 beats (not noted on diary).  Fastest was 5 beats at a rate of 193 bpm, longest was 15 beats at a rate of 106 bpm.  Rare isolated PACs (as well as couplets and triplets) with rare isolated PVCs.  No sustained arrhythmias or bradycardia to explain syncope.   ANTERIOR CERVICAL DECOMPRESSION/DISCECTOMY FUSION 4 LEVELS N/A 11/14/2013   Procedure: ANTERIOR CERVICAL DECOMPRESSION/DISCECTOMY FUSION 4 LEVELS;  Surgeon: Garry Kansas, MD;  Location: [facility];  Service: Neurosurgery;  Laterality: N/A;  C34 C45 C56 C67 anterior cervical fusion with interbody prosthesis plating and  bonegraft   APPENDECTOMY  age 76   BREAST LUMPECTOMY  06/16/2009   left; SLN bx.   BREAST LUMPECTOMY  07/01/2009   re-excision   BREAST SURGERY  02/22/1997   reduction   CATARACT EXTRACTION, BILATERAL     COLONOSCOPY WITH PROPOFOL  N/A 12/03/2014   Procedure: COLONOSCOPY WITH PROPOFOL ;  Surgeon: Garrett Kallman, MD;  Location: [facility];  Service: Endoscopy;  Laterality: N/A;   FOOT SURGERY  06/22/2011   left   KNEE ARTHROSCOPY  03/12/2005   right  KNEE ARTHROSCOPY     left   Lower Extremity Venous Dopplers  12/12/2020   No DVT bilaterally in the deep veins or superficial veins.  No deep or superficial venous reflux noted bilaterally with exception of Right SSV at the knee.   NASAL SINUS SURGERY     x 2   NEPHRECTOMY LIVING DONOR Left 04/22/2008   donated to spouse 2010([facility])   TRANSTHORACIC ECHOCARDIOGRAM  12/10/2020   EF 60 to 65%.  Normal LV size and function.  No heart WMA.  GR 1 DD.  Normal RV, RVP and RAP.Aaron Aas  Normal valves.   TRIGGER FINGER RELEASE  12/16/2011   Procedure: RELEASE TRIGGER FINGER/A-1 PULLEY;  Surgeon: Milagros Alf, MD;  Location: [facility];  Service: Orthopedics;  Laterality: Right;  RIGHT LONG FINGER TRIGGER RELEASE & ANNULAR CYST EXCISION   TUMOR EXCISION  age 43   right arm   Patient Active Problem List    Diagnosis Date Noted   Rectal bleeding 07/27/2022   Hemorrhoids 07/27/2022   Chronic pain of both knees 06/21/2022   Moderate persistent asthma without complication 03/27/2021   PSVT (paroxysmal supraventricular tachycardia) (HCC) 11/26/2020   Syncope and collapse 11/24/2020   Primary osteoarthritis of right knee 05/12/2020   Primary osteoarthritis of left knee 05/12/2020   Hyperlipidemia    Bilateral calf pain 03/05/2019   Genetic testing 02/13/2019   Family history of ovarian cancer    Family history of bladder cancer    Family history of colon cancer    Family history of leukemia    Lumbar radiculopathy 12/08/2018   Myofascial pain 12/08/2018   Impaired gait and mobility 12/08/2018   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   Chronic nonallergic rhinitis 12/29/2016   Mild persistent asthma, uncomplicated 12/29/2016   Vocal fold paralysis, 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   Gastroesophageal reflux disease 12/29/2016   Cervical spondylosis with myelopathy and radiculopathy 11/14/2013   Essential tremor 06/13/2013   Cervical spondylosis without myelopathy 06/13/2013   Breast cancer of lower-outer quadrant of left female breast (HCC) 09/10/2010    PCP: Arva Lathe, MD  REFERRING PROVIDER: Shauna Del, MD  REFERRING DIAG: M17.0 (ICD-10-CM) - Bilateral primary osteoarthritis of knee   THERAPY DIAG:  Unsteadiness on feet  Abnormal posture  Chronic pain of both knees  Other abnormalities of gait and mobility  History of falling  Muscle weakness (generalized)  Rationale for Evaluation and Treatment: Rehabilitation  ONSET DATE: 04/28/2023 MD referral to PT  SUBJECTIVE:   SUBJECTIVE STATEMENT: Pt with 4/10 pain today in her Rt knee. Pt can't think of anything in particular that may have aggravated her pain.    PERTINENT HISTORY: Cervical Decompression surgery 2015, Supraventicular tachycardia, syncope, OA, breast CA, Donated right kidney to her husband, HTN, Parkinson's  Disease with slight tremor,   PAIN:  NPRS scale: 4/10 Rt knee Pain location: knees RLE>LLE more medially Pain description: bad ache Aggravating factors: walking,  standing more than few minutes Relieving factors: injections,  tylenol  takes edge off,  sleeping with pillow bw knees.   PRECAUTIONS: Fall  WEIGHT BEARING RESTRICTIONS: No  FALLS:  Has patient fallen in last 6 months? Yes. Number of falls 3 with knot on head,   LIVING ENVIRONMENT: Lives with: lives alone Lives in: Townhouse 2-story with master bedroom & bathroom downstairs,  Stairs: Yes: Internal: 14-15 steps; on right going up and on left going up and External: 4 steps; can reach both Has following equipment at home: Single point cane, Walker - 4 wheeled,  shower chair, and Grab bars  OCCUPATION: retired  PLOF: Independent  PATIENT GOALS:  learn exercises to function better and with less pain.   Next MD visit:  OBJECTIVE:  DIAGNOSTIC FINDINGS: Radiographs show evidence of joint space narrowing, osteophytes, subchondral sclerosis and/or subchondral cysts  Patient-Specific Activity Scoring Scheme  "0" represents "unable to perform." "10" represents "able to perform at prior level. 0 1 2 3 4 5 6 7 8 9  10 (Date and Score)   Activity Eval  06/06/23  06/13/23 06/28/23  1. Walk around house  5  7 7 7   2. Stairs   2 (pt changed rating)  5 5 5   3. Walking in community 3 5 6 6   4.      5.      Score 4.33 5.67 6 6   Total score = sum of the activity scores/number of activities Minimum detectable change (90%CI) for average score = 2 points Minimum detectable change (90%CI) for single activity score = 3 points  COGNITION: Overall cognitive status: WFL   POSTURE: rounded shoulders, forward head, and flexed trunk   PALPATION: Tenderness bilateral knees medially  LOWER EXTREMITY ROM:   ROM Right eval Left eval Right 05/25/23 Left 05/25/23 Right/Left 06/08/23 Rt/ Left  Hip flexion        Hip extension  Standing -11* Standing -12*      Hip abduction        Hip adduction        Hip internal rotation        Hip external rotation        Knee flexion seated A: 104* Seated A: 115*      Knee extension Seated LAQ: -8* Seated LAQ -9* Seated LAQ -4 Seated LAQ -6 Seated LAQ -3/-5   Ankle dorsiflexion        Ankle plantarflexion        Ankle inversion        Ankle eversion         (Blank rows = not tested)  LOWER EXTREMITY MMT:  MMT Right eval Left eval Right 06/13/23 Left 06/13/23  Hip flexion      Hip extension      Hip abduction      Hip adduction      Hip internal rotation      Hip external rotation      Knee flexion seated 4/5 Seated 4/5 5 5   Knee extension 4/5 4/5 5 5   Ankle dorsiflexion 5/5 5/5 5 5   Ankle plantarflexion      Ankle inversion      Ankle eversion       (Blank rows = not tested)  FUNCTIONAL TESTS:  05/09/2023 Evaluation: 18 inch chair transfer: Requires use of single upper extremity on armrest to arise from an 18 inch chair Timed Up and Go with single-point cane: Standard 19.59 seconds and cognitive 30.40 seconds (55% increase) with need to stop naming with turn and limited ability to restart.  Both test indicate high risk of falls especially when distracted.  05/30/23: TUG time with cane 14:37 sec  TUG time without cane 12:88  06/13/23  Cognitive TUG with cane 23 sec counting by 4's  Cognitive TUG with cane 24 sec counting by 3'3 06/28/23:   Cognitive TUG with cane 21 sec counting by 5'3  GAIT: 06/08/2023: Gait Velocity with cane self-selected  1.93 ft/sec and fast pace 2.25  ft/sec  No device self-selected  2.19 ft/sec  05/09/2023 Evaluation: Distance walked: 100'  Assistive device utilized: Single point cane Level of assistance: SBA Comments: Patient has forward trunk lean, hip and knee flexion in stance, poor clearance in swing and decreased arm swing.   TODAY'S  TREATMENT                                                                           DATE: 06/28/23:  TherEx Nustep: level 4 x 10 minutes, seat 7, arms 7 LAQ 2 x 10 bil LE red TB Hamstring curls 2 x 10 red TB  Neuro Re-Ed Standing on Airex mat: feet apart x 1 minute with lose supervision Standing on Airex mat: feet together x 30 sec with close supervision Walking around 6 cones in figure 8 pattern x 4   TherAct Leg press: 68# BLEs x 20 Leg Press: single LE: 25# x 15 Sit to stand: red TB around knees to prevent valgus Step up on 4 inch step x 15 bil LE c single UE support     06/24/23  Nustep L5x10 minutes all four extremities, seat 7 Leg press 68# BLEs x20  LAQS red TB x10 B STS red TB above knees x5- high mat table, lots of difficulty keeping tension on band/avoiding knee valgus while still using good eccentric control   Tandem stance 2x30 seconds B Narrow BOS 2x30 seconds B  Stepping over 4 inch hurdles x2 rounds- lots of cues for foot clearance     06/21/23:  Therapeutic Exercise:  Nustep seat 7 BUE/BLE L5 for 10 min LAQ x 10 holding 3-5 seconds bil LE Hamstring curls x 15 bil  NMR:  Heel taps on 4 inch step x 20 alternating  Toe taps on 4 inch step x 20 alternating Fitter first 2 point rocker board: rocking side to side x 2 minutes c close supervision to CGA, rocking back and forth x 2 minutes c close supervision to CGA (each c UE support bilaterally) Standing on level surface, UE reaching up/down/cross body, side to side tapping therapist's hand in varying positions 2 x 10 bil UE's Therapeutic Activities: Side stepping: c UE support in parallel bars x 4  Standing marching x 20 c UE support Stepping over theraband strips on the floor x 4 for 30 feet x 2     PATIENT EDUCATION:  Education details: HEP, POC Person educated: Patient Education method: Programmer, multimedia, Demonstration, Verbal cues, and Handouts Education comprehension: verbalized understanding, returned demonstration, and verbal cues required  HOME EXERCISE PROGRAM: Access Code:  [email or web address] URL: [email or web address] Date: 05/11/2023 Prepared by: Lorie Rook  Exercises - Seated Long Arc Quad  - 1 x daily - 7 x weekly - 2 sets - 10 reps - 5 seconds hold - Seated Hamstring Stretch with Strap  - 1 x daily - 7 x weekly - 1 sets - 3 reps - 30 seconds hold - sit to stand to sit with counter  - 1 x daily - 7 x weekly - 2 sets - 10 reps - 5 seconds hold - squat at sink with chair behind you  - 1 x daily - 7 x weekly - 1 sets - 10 reps - 5 seconds hold - Modified [name] Stretch  - 1 x daily - 7 x weekly -  1 sets - 3 reps - 30 seconds hold - Supine Bridge  - 1 x daily - 7 x weekly - 2 sets - 10 reps - 5 seconds hold  ASSESSMENT: CLINICAL IMPRESSION: Pt reporting 4/10 Rt knee pain upon arrival. Treatment focusing on LE strengthening and functional mobility. Pt needing cues for knee alignment to prevent valgus. Pt tolerating exercises well. Pt is pleased with her progress with PT. Pt feels she has reached her maximum potential with skilled PT at present time. I am discharging pt this visit with 3/5 LTG's met.     OBJECTIVE IMPAIRMENTS: Abnormal gait, decreased activity tolerance, decreased balance, decreased coordination, decreased endurance, decreased knowledge of condition, decreased knowledge of use of DME, decreased mobility, decreased ROM, decreased strength, dizziness, impaired flexibility, postural dysfunction, and pain.   ACTIVITY LIMITATIONS: carrying, lifting, standing, stairs, transfers, and locomotion level  PARTICIPATION LIMITATIONS: meal prep, cleaning, and community activity  PERSONAL FACTORS: Age, Fitness, Past/current experiences, and 3+ comorbidities: see PMH  are also affecting patient's functional outcome.   REHAB POTENTIAL: Good  CLINICAL DECISION MAKING: Evolving/moderate complexity  EVALUATION COMPLEXITY: Moderate   GOALS: Goals reviewed with patient? Yes  SHORT TERM GOALS: (target date for Short term goals 06/03/2023)   1.   Patient will demonstrate independent use of home exercise program to maintain progress from in clinic treatments. Baseline: See objective data Goal status: MET 05/23/23  2. Timed Up & Go <15 sec with cane Baseline: See objective data Goal status: MET 05/30/23  LONG TERM GOALS: (target dates for all long term goals  07/01/2023 )   1. Patient will demonstrate/report bilateral pain at worst less than or equal to 4/10 to facilitate minimal limitation in daily activity secondary to pain symptoms. Baseline: See objective data Goal status: On-going 06/28/23   2. Patient will demonstrate independent use of home exercise program to facilitate ability to maintain/progress functional gains from skilled physical therapy services. Baseline: See objective data Goal status: MET 06/13/23  3.  Patient reports Patient-Specific Activity Score improved the average by 3 to indicate improvement in functional activities.  Baseline: SEE OBJECTIVE DATA Goal status: Met 06/28/23   4.  Timed Up & Go cognitive <20 sec. Baseline: See objective data Goal status: Ongoing  06/28/2023, 21.8 seconds   5.  Patient reports no fall in last 2 weeks.  Baseline: See objective data Goal status: met 06/13/23   PLAN:  PT FREQUENCY:  2x/week  PT DURATION: 8 weeks  PLANNED INTERVENTIONS: 97164- PT Re-evaluation, 97110-Therapeutic exercises, 97530- Therapeutic activity, 97112- Neuromuscular re-education, 97535- Self Care, 59563- Manual therapy, [phone]- Gait training, [phone]- Canalith repositioning, Patient/Family education, Balance training, Stair training, Taping, Dry Needling, Joint mobilization, Vestibular training, DME instructions, and Physical Performance Testing  PHYSICAL THERAPY DISCHARGE SUMMARY  Visits from Start of Care: 13  Current functional level related to goals / functional outcomes: See above   Remaining deficits: See above   Education / Equipment: HEP   Patient agrees to discharge. Patient goals were  partially met. Patient is being discharged due to being pleased with the current functional level.   Jerrel Mor, PT, MPT 06/28/23 2:27 PM   06/28/23 2:27 PM

## 2023-07-01 ENCOUNTER — Encounter: Admitting: Physical Therapy

## 2023-07-05 ENCOUNTER — Encounter: Admitting: Physical Therapy

## 2023-07-08 ENCOUNTER — Encounter: Admitting: Physical Therapy

## 2023-07-12 ENCOUNTER — Encounter: Admitting: Physical Therapy

## 2023-07-14 ENCOUNTER — Encounter: Admitting: Physical Therapy

## 2023-07-21 ENCOUNTER — Ambulatory Visit: Admitting: Sports Medicine

## 2023-07-21 ENCOUNTER — Encounter: Payer: Self-pay | Admitting: Sports Medicine

## 2023-07-21 DIAGNOSIS — G8929 Other chronic pain: Secondary | ICD-10-CM

## 2023-07-21 DIAGNOSIS — M25562 Pain in left knee: Secondary | ICD-10-CM | POA: Diagnosis not present

## 2023-07-21 DIAGNOSIS — M25561 Pain in right knee: Secondary | ICD-10-CM | POA: Diagnosis not present

## 2023-07-21 DIAGNOSIS — M170 Bilateral primary osteoarthritis of knee: Secondary | ICD-10-CM

## 2023-07-21 MED ORDER — BUPIVACAINE HCL 0.25 % IJ SOLN
2.0000 mL | INTRAMUSCULAR | Status: AC | PRN
  Administered 2023-07-21: 2 mL via INTRA_ARTICULAR

## 2023-07-21 MED ORDER — HYALURONAN 88 MG/4ML IX SOSY
88.0000 mg | PREFILLED_SYRINGE | INTRA_ARTICULAR | Status: AC | PRN
  Administered 2023-07-21: 88 mg via INTRA_ARTICULAR

## 2023-07-21 MED ORDER — LIDOCAINE HCL 1 % IJ SOLN
2.0000 mL | INTRAMUSCULAR | Status: AC | PRN
Start: 2023-07-21 — End: 2023-07-21
  Administered 2023-07-21: 2 mL

## 2023-07-21 MED ORDER — LIDOCAINE HCL 1 % IJ SOLN
2.0000 mL | INTRAMUSCULAR | Status: AC | PRN
  Administered 2023-07-21: 2 mL

## 2023-07-21 NOTE — Progress Notes (Signed)
 Patient says that her knees have been bothering her for awhile now, with the right still worse than the left. She says that she has the most trouble in the mornings when she first gets out of bed, and needs her cane until she is up and moving. She denies any new injuries or falls.

## 2023-07-21 NOTE — Progress Notes (Signed)
 April Church - 83 y.o. female MRN [id number]  Date of birth: 01-10-1941  Office Visit Note: Visit Date: 07/21/2023 PCP: Arva Lathe, MD Referred by: Arva Lathe,*  Subjective: Chief Complaint  Patient presents with   Right Knee - Follow-up   Left Knee - Follow-up   HPI: April Church is a pleasant 83 y.o. female who presents today for follow-up of bilateral knee pain with advanced OA. Also planning to proceed with MonoVisc injections today.  We did do knee corticosteroid injections b/l on 04/28/23.  She believes this gave her about a good month of pain relief but then her pain slowly started to worsen.  Currently she feels back to about baseline in terms of her pain.  She has done a number of sessions of formalized physical therapy and just recently transition to home therapy.  The right knee is worse than the left.  She has more pain in the mornings and when she gets moving this will improve.  She is using Tylenol  for pain relief as needed.  Pertinent ROS were reviewed with the patient and found to be negative unless otherwise specified above in HPI.   Assessment & Plan: Visit Diagnoses:  1. Bilateral primary osteoarthritis of knee   2. Chronic pain of both knees    Plan: Impression is chronic bilateral knee pain with bone-on-bone arthritic change of the right knee and moderate to severe left knee osteoarthritis.  She did get temporary relief from previous corticosteroid injection, but this is starting to last shorter in terms of duration.  She did receive more prolonged relief from prior viscosupplementation in November, through shared decision making we did proceed with bilateral Monovisc injection into the right and left knee today.  Discussed postinjection protocol.  She has completed formal PT but will continue her home therapy as able.  I would like to see her back in about 1 month to reevaluate how she is doing and discuss next steps.  Follow-up:  Return in about 1 month (around 08/21/2023) for bilateral knee pain f/u  Meds & Orders: No orders of the defined types were placed in this encounter.   Orders Placed This Encounter  Procedures   Large Joint Inj: R knee   Large Joint Inj: L knee     Procedures: Large Joint Inj: R knee on 07/21/2023 1:09 PM Indications: pain Details: 22 G 1.5 in needle, anterolateral approach Medications: 2 mL lidocaine  1 %; 2 mL bupivacaine  0.25 %; 88 mg Hyaluronan 88 MG/4ML Outcome: tolerated well, no immediate complications Procedure, treatment alternatives, risks and benefits explained, specific risks discussed. Consent was given by the patient. Patient was prepped and draped in the usual sterile fashion.    Large Joint Inj: L knee on 07/21/2023 1:11 PM Indications: pain Details: 22 G 1.5 in needle, anterolateral approach Medications: 2 mL lidocaine  1 %; 2 mL bupivacaine  0.25 %; 88 mg Hyaluronan 88 MG/4ML Outcome: tolerated well, no immediate complications Procedure, treatment alternatives, risks and benefits explained, specific risks discussed. Consent was given by the patient. Patient was prepped and draped in the usual sterile fashion.          Clinical History: No specialty comments available.  She reports that she has never smoked. She has never used smokeless tobacco. No results for input(s): "HGBA1C", "LABURIC" in the last 8760 hours.  Objective:    Physical Exam  Gen: Well-appearing, in no acute distress; non-toxic CV: Well-perfused. Warm.  Resp: Breathing unlabored on room air; no wheezing. Psych: Fluid  speech in conversation; appropriate affect; normal thought process  Ortho Exam - Bilateral knees: Minimal effusion on the right knee, left knee without effusion.  Range of motion continues to be limited with the right knee 10-95 degrees compared to 5-110 degrees at the left knee.  There is pseudo instability with knee valgus on the right knee.  There is bilateral patellar  crepitus.  Imaging:  12/23/22: 4 views of bilateral knees including AP standing, Rosenberg, lateral and  sunrise view were ordered and reviewed by myself today.  X-rays show  complete bone-on-bone arthritic change of the medial tibiofemoral joint  space as well as advanced tricompartmental change.  There is notable bony  sclerosis over the medial femoral condyle and to a lesser degree the  medial tibial plateau of bilateral knees.  No acute fracture noted.  There  is varus formation of both knees.   Past Medical/Family/Surgical/Social History: Medications & Allergies reviewed per EMR, new medications updated. Patient Active Problem List   Diagnosis Date Noted   Rectal bleeding 07/27/2022   Hemorrhoids 07/27/2022   Chronic pain of both knees 06/21/2022   Moderate persistent asthma without complication 03/27/2021   PSVT (paroxysmal supraventricular tachycardia) (HCC) 11/26/2020   Syncope and collapse 11/24/2020   Primary osteoarthritis of right knee 05/12/2020   Primary osteoarthritis of left knee 05/12/2020   Hyperlipidemia    Bilateral calf pain 03/05/2019   Genetic testing 02/13/2019   Family history of ovarian cancer    Family history of bladder cancer    Family history of colon cancer    Family history of leukemia    Lumbar radiculopathy 12/08/2018   Myofascial pain 12/08/2018   Impaired gait and mobility 12/08/2018   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   Chronic nonallergic rhinitis 12/29/2016   Mild persistent asthma, uncomplicated 12/29/2016   Vocal fold paralysis, 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 2011- no further tx. now- Dr. Kendra Pavy , Dr. Ike Malady   Cyst of finger 11/2011   annular cyst right long finger   Dental crowns present    Dermatitis    Frequency of urination    GERD (gastroesophageal reflux disease)    Hemorrhoid    History of colon polyps 2009   Also noted 2012 and 2017 by colonoscopy.   History of shingles 1971   Had right ischial recurrence in the March 2019   Hyperlipidemia    On atorvastatin    Hypertension    under control, has been on med. x 2 yrs.   Liposarcoma of left shoulder (HCC) 1969   Parkinson's disease (HCC)    With mild tremor (neurologist Dr. Salli Crawley), neurosurgeon Dr. Larrie Po   PONV (postoperative nausea and vomiting)    Seasonal allergies    Shingles of eyelid 1971   Right eyelid; recurrent -> last episode 05/11/2017   Tremors of nervous system    hands-essential tremor; associated with Parkinson's.   Trigger finger of right hand 11/2011   long finger   Family History  Problem Relation Age of Onset   Kidney disease Mother  Stroke Father    Stroke Sister        08/2015   Diabetes Brother    Heart disease Brother    Ovarian cancer Sister 33   Heart disease Paternal Grandmother    Heart disease Paternal Grandfather    Leukemia Other 15       brother's grandson   Bladder Cancer Brother 101   Colon cancer Niece        dx in late 78s   Past Surgical History:  Procedure Laterality Date   14-day Zio Patch Monitor  08/2020   (report to be scanned): Predominant SR w/ HR 61 to 142 bpm and average 86 bpm.  Total of 59 episodes of PAT/PSVT  4 to 15 beats (not noted on diary).  Fastest was 5 beats at a rate of 193 bpm, longest was 15 beats at a rate of 106 bpm.  Rare isolated PACs (as well as couplets and triplets) with rare isolated PVCs.  No sustained arrhythmias or bradycardia to explain syncope.   ANTERIOR CERVICAL  DECOMPRESSION/DISCECTOMY FUSION 4 LEVELS N/A 11/14/2013   Procedure: ANTERIOR CERVICAL DECOMPRESSION/DISCECTOMY FUSION 4 LEVELS;  Surgeon: Garry Kansas, MD;  Location: [facility];  Service: Neurosurgery;  Laterality: N/A;  C34 C45 C56 C67 anterior cervical fusion with interbody prosthesis plating and  bonegraft   APPENDECTOMY  age 89   BREAST LUMPECTOMY  06/16/2009   left; SLN bx.   BREAST LUMPECTOMY  07/01/2009   re-excision   BREAST SURGERY  02/22/1997   reduction   CATARACT EXTRACTION, BILATERAL     COLONOSCOPY WITH PROPOFOL  N/A 12/03/2014   Procedure: COLONOSCOPY WITH PROPOFOL ;  Surgeon: Garrett Kallman, MD;  Location: [facility];  Service: Endoscopy;  Laterality: N/A;   FOOT SURGERY  06/22/2011   left   KNEE ARTHROSCOPY  03/12/2005   right   KNEE ARTHROSCOPY     left   Lower Extremity Venous Dopplers  12/12/2020   No DVT bilaterally in the deep veins or superficial veins.  No deep or superficial venous reflux noted bilaterally with exception of Right SSV at the knee.   NASAL SINUS SURGERY     x 2   NEPHRECTOMY LIVING DONOR Left 04/22/2008   donated to spouse 2010([facility])   TRANSTHORACIC ECHOCARDIOGRAM  12/10/2020   EF 60 to 65%.  Normal LV size and function.  No heart WMA.  GR 1 DD.  Normal RV, RVP and RAP.Aaron Aas  Normal valves.   TRIGGER FINGER RELEASE  12/16/2011   Procedure: RELEASE TRIGGER FINGER/A-1 PULLEY;  Surgeon: Milagros Alf, MD;  Location: [facility];  Service: Orthopedics;  Laterality: Right;  RIGHT LONG FINGER TRIGGER RELEASE & ANNULAR CYST EXCISION   TUMOR EXCISION  age 4   right arm   Social History   Occupational History   Occupation: Retired   Occupation: Housewife  Tobacco Use   Smoking status: Never   Smokeless tobacco: Never  Vaping Use   Vaping status: Never Used  Substance and Sexual Activity   Alcohol use: No    Alcohol/week: 0.0 standard drinks of alcohol   Drug use: No   Sexual activity: Not Currently

## 2023-07-23 DIAGNOSIS — G20A1 Parkinson's disease without dyskinesia, without mention of fluctuations: Secondary | ICD-10-CM | POA: Diagnosis not present

## 2023-07-23 DIAGNOSIS — F3341 Major depressive disorder, recurrent, in partial remission: Secondary | ICD-10-CM | POA: Diagnosis not present

## 2023-07-23 DIAGNOSIS — E785 Hyperlipidemia, unspecified: Secondary | ICD-10-CM | POA: Diagnosis not present

## 2023-07-23 DIAGNOSIS — I10 Essential (primary) hypertension: Secondary | ICD-10-CM | POA: Diagnosis not present

## 2023-07-23 DIAGNOSIS — C50512 Malignant neoplasm of lower-outer quadrant of left female breast: Secondary | ICD-10-CM | POA: Diagnosis not present

## 2023-08-03 DIAGNOSIS — I10 Essential (primary) hypertension: Secondary | ICD-10-CM | POA: Diagnosis not present

## 2023-08-22 ENCOUNTER — Ambulatory Visit (INDEPENDENT_AMBULATORY_CARE_PROVIDER_SITE_OTHER): Admitting: Sports Medicine

## 2023-08-22 ENCOUNTER — Encounter: Payer: Self-pay | Admitting: Sports Medicine

## 2023-08-22 DIAGNOSIS — E785 Hyperlipidemia, unspecified: Secondary | ICD-10-CM | POA: Diagnosis not present

## 2023-08-22 DIAGNOSIS — M170 Bilateral primary osteoarthritis of knee: Secondary | ICD-10-CM | POA: Diagnosis not present

## 2023-08-22 DIAGNOSIS — M25561 Pain in right knee: Secondary | ICD-10-CM

## 2023-08-22 DIAGNOSIS — Z905 Acquired absence of kidney: Secondary | ICD-10-CM

## 2023-08-22 DIAGNOSIS — G8929 Other chronic pain: Secondary | ICD-10-CM

## 2023-08-22 DIAGNOSIS — M25562 Pain in left knee: Secondary | ICD-10-CM | POA: Diagnosis not present

## 2023-08-22 DIAGNOSIS — C50512 Malignant neoplasm of lower-outer quadrant of left female breast: Secondary | ICD-10-CM | POA: Diagnosis not present

## 2023-08-22 DIAGNOSIS — F3341 Major depressive disorder, recurrent, in partial remission: Secondary | ICD-10-CM | POA: Diagnosis not present

## 2023-08-22 DIAGNOSIS — G20C Parkinsonism, unspecified: Secondary | ICD-10-CM | POA: Diagnosis not present

## 2023-08-22 DIAGNOSIS — I10 Essential (primary) hypertension: Secondary | ICD-10-CM | POA: Diagnosis not present

## 2023-08-22 MED ORDER — BUPIVACAINE HCL 0.25 % IJ SOLN
2.0000 mL | INTRAMUSCULAR | Status: AC | PRN
  Administered 2023-08-22: 2 mL via INTRA_ARTICULAR

## 2023-08-22 MED ORDER — LIDOCAINE HCL 1 % IJ SOLN
2.0000 mL | INTRAMUSCULAR | Status: AC | PRN
  Administered 2023-08-22: 2 mL

## 2023-08-22 MED ORDER — METHYLPREDNISOLONE ACETATE 40 MG/ML IJ SUSP
40.0000 mg | INTRAMUSCULAR | Status: AC | PRN
  Administered 2023-08-22: 40 mg via INTRA_ARTICULAR

## 2023-08-22 MED ORDER — METHYLPREDNISOLONE ACETATE 40 MG/ML IJ SUSP
40.0000 mg | INTRAMUSCULAR | Status: AC | PRN
Start: 2023-08-22 — End: 2023-08-22
  Administered 2023-08-22: 40 mg via INTRA_ARTICULAR

## 2023-08-22 NOTE — Progress Notes (Signed)
 April Church - 83 y.o. female MRN [id number]  Date of birth: Jul 24, 1940  Office Visit Note: Visit Date: 08/22/2023 PCP: Elliot Charm, MD Referred by: Elliot Charm,*  Subjective: Chief Complaint  Patient presents with   Right Knee - Follow-up   Left Knee - Follow-up   HPI: April Church is a pleasant 83 y.o. female who presents today for acute on chronic bilateral knee pain with OA.  She continues with bilateral knee pain, although right worse than left.  She does feel that she got some benefit from the viscosupplementation injection back in May, although does feel like her pain is starting to return.  She has completed her formalized PT but is doing some home therapy as well.  She is open to the idea of possible knee replacement, but would need to discuss this more and make arrangements with family as she has 1 daughter who lives just outside the [place] area.  She is using Tylenol  for pain relief once to twice daily.  Has seen pain management, Dr. Lovorn in the past who has prescribed tramadol  50 mg to take for breakthrough pain only.  Pertinent ROS were reviewed with the patient and found to be negative unless otherwise specified above in HPI.   Assessment & Plan: Visit Diagnoses:  1. Bilateral primary osteoarthritis of knee   2. Chronic pain of both knees   3. Single kidney    Plan: Impression is acute on chronic bilateral knee pain with advanced osteoarthritis.  We did review her x-rays today which show complete bone-on-bone arthritic change of the medial tibiofemoral joint space right and left knee.  She did receive some relief from viscosupplementation injections.  We discussed today trialing corticosteroid injection with active viscosupplementation in place to see if she can get some cumulative benefit while both are within the knee joint.  Through shared decision making, we did proceed with both right and left knee corticosteroid injection,  patient tolerated well.  Advised on postinjection protocol.  After 48 hours, she may return to activity as tolerated and continue her home rehab exercises for strengthening and her gait stability.  We will follow-up in about 6 weeks and reevaluate at that time.  If she is receiving relief, we will continue the current treatment.  If her knees are still rather bothersome, at that point we may consider referral to one of my knee replacement surgeons to at least discuss this to see if this would be an option for her.  She may continue her Tylenol  500 mg twice daily and she may use her tramadol  50 mg to take for breakthrough pain only.  Avoid NSAIDs given her single kidney.  Follow-up in 6 weeks.  Follow-up: Return in about 6 weeks (around 10/03/2023) for Bilateral knee OA.   Meds & Orders: No orders of the defined types were placed in this encounter.  No orders of the defined types were placed in this encounter.    Procedures: Large Joint Inj: R knee on 08/22/2023 1:29 PM Indications: pain Details: 22 G 1.5 in needle, anterolateral approach Medications: 2 mL lidocaine  1 %; 2 mL bupivacaine  0.25 %; 40 mg methylPREDNISolone  acetate 40 MG/ML Outcome: tolerated well, no immediate complications Procedure, treatment alternatives, risks and benefits explained, specific risks discussed. Consent was given by the patient. Patient was prepped and draped in the usual sterile fashion.    Large Joint Inj: L knee on 08/22/2023 1:29 PM Indications: pain Details: 22 G 1.5 in needle, anterolateral approach Medications: 2  mL lidocaine  1 %; 2 mL bupivacaine  0.25 %; 40 mg methylPREDNISolone  acetate 40 MG/ML Outcome: tolerated well, no immediate complications Procedure, treatment alternatives, risks and benefits explained, specific risks discussed. Consent was given by the patient. Patient was prepped and draped in the usual sterile fashion.          Clinical History: No specialty comments available.  She reports  that she has never smoked. She has never used smokeless tobacco. No results for input(s): HGBA1C, LABURIC in the last 8760 hours.  Objective:    Physical Exam  Gen: Well-appearing, in no acute distress; non-toxic CV: Well-perfused. Warm.  Resp: Breathing unlabored on room air; no wheezing. Psych: Fluid speech in conversation; appropriate affect; normal thought process  Ortho Exam - Bilateral knees: No significant effusion of either knee.  Range of motion is limited on the right knee from about 8-95 degrees compared to 5-110 degrees of the contralateral left knee.  There is pseudo instability with knee valgus on the right knee.  Bilateral patellar crepitus.  Patient does have a degree of proximal muscle weakness and does ambulate with a cane.  Imaging:  12/23/22: 4 views of bilateral knees including AP standing, Rosenberg, lateral and  sunrise view were ordered and reviewed by myself today.  X-rays show  complete bone-on-bone arthritic change of the medial tibiofemoral joint  space as well as advanced tricompartmental change.  There is notable bony  sclerosis over the medial femoral condyle and to a lesser degree the  medial tibial plateau of bilateral knees.  No acute fracture noted.  There  is varus formation of both knees.   Past Medical/Family/Surgical/Social History: Medications & Allergies reviewed per EMR, new medications updated. Patient Active Problem List   Diagnosis Date Noted   Rectal bleeding 07/27/2022   Hemorrhoids 07/27/2022   Chronic pain of both knees 06/21/2022   Moderate persistent asthma without complication 03/27/2021   PSVT (paroxysmal supraventricular tachycardia) (HCC) 11/26/2020   Syncope and collapse 11/24/2020   Primary osteoarthritis of right knee 05/12/2020   Primary osteoarthritis of left knee 05/12/2020   Hyperlipidemia    Bilateral calf pain 03/05/2019   Genetic testing 02/13/2019   Family history of ovarian cancer    Family history of  bladder cancer    Family history of colon cancer    Family history of leukemia    Lumbar radiculopathy 12/08/2018   Myofascial pain 12/08/2018   Impaired gait and mobility 12/08/2018   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   Chronic nonallergic rhinitis 12/29/2016   Mild persistent asthma, uncomplicated 12/29/2016   Vocal fold paralysis, 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 2011- no further tx. now- Dr. Melodye , Dr. Jason   Cyst of finger 11/2011   annular cyst right long finger   Dental crowns present    Dermatitis    Frequency of urination    GERD (gastroesophageal reflux disease)    Hemorrhoid    History of colon polyps 2009   Also noted 2012 and 2017 by colonoscopy.   History of shingles 1971  Had right ischial recurrence in the March 2019   Hyperlipidemia    On atorvastatin    Hypertension    under control, has been on med. x 2 yrs.   Liposarcoma of left shoulder (HCC) 1969   Parkinson's disease (HCC)    With mild tremor (neurologist Dr. Margaret), neurosurgeon Dr. Mavis   PONV (postoperative nausea and vomiting)    Seasonal allergies    Shingles of eyelid 1971   Right eyelid; recurrent -> last episode 05/11/2017   Tremors of nervous system    hands-essential tremor; associated with Parkinson's.   Trigger finger of right hand 11/2011   long finger   Family History  Problem Relation Age of Onset   Kidney disease Mother    Stroke Father    Stroke Sister        08/2015   Diabetes  Brother    Heart disease Brother    Ovarian cancer Sister 27   Heart disease Paternal Grandmother    Heart disease Paternal Grandfather    Leukemia Other 15       brother's grandson   Bladder Cancer Brother 73   Colon cancer Niece        dx in late 84s   Past Surgical History:  Procedure Laterality Date   14-day Zio Patch Monitor  08/2020   (report to be scanned): Predominant SR w/ HR 61 to 142 bpm and average 86 bpm.  Total of 59 episodes of PAT/PSVT  4 to 15 beats (not noted on diary).  Fastest was 5 beats at a rate of 193 bpm, longest was 15 beats at a rate of 106 bpm.  Rare isolated PACs (as well as couplets and triplets) with rare isolated PVCs.  No sustained arrhythmias or bradycardia to explain syncope.   ANTERIOR CERVICAL DECOMPRESSION/DISCECTOMY FUSION 4 LEVELS N/A 11/14/2013   Procedure: ANTERIOR CERVICAL DECOMPRESSION/DISCECTOMY FUSION 4 LEVELS;  Surgeon: Reyes Mavis, MD;  Location: [facility];  Service: Neurosurgery;  Laterality: N/A;  C34 C45 C56 C67 anterior cervical fusion with interbody prosthesis plating and  bonegraft   APPENDECTOMY  age 70   BREAST LUMPECTOMY  06/16/2009   left; SLN bx.   BREAST LUMPECTOMY  07/01/2009   re-excision   BREAST SURGERY  02/22/1997   reduction   CATARACT EXTRACTION, BILATERAL     COLONOSCOPY WITH PROPOFOL  N/A 12/03/2014   Procedure: COLONOSCOPY WITH PROPOFOL ;  Surgeon: Gladis MARLA Louder, MD;  Location: [facility];  Service: Endoscopy;  Laterality: N/A;   FOOT SURGERY  06/22/2011   left   KNEE ARTHROSCOPY  03/12/2005   right   KNEE ARTHROSCOPY     left   Lower Extremity Venous Dopplers  12/12/2020   No DVT bilaterally in the deep veins or superficial veins.  No deep or superficial venous reflux noted bilaterally with exception of Right SSV at the knee.   NASAL SINUS SURGERY     x 2   NEPHRECTOMY LIVING DONOR Left 04/22/2008   donated to spouse 2010([facility])   TRANSTHORACIC ECHOCARDIOGRAM  12/10/2020   EF 60 to 65%.  Normal  LV size and function.  No heart WMA.  GR 1 DD.  Normal RV, RVP and RAP.SABRA  Normal valves.   TRIGGER FINGER RELEASE  12/16/2011   Procedure: RELEASE TRIGGER FINGER/A-1 PULLEY;  Surgeon: Franky JONELLE Curia, MD;  Location: [facility];  Service: Orthopedics;  Laterality: Right;  RIGHT LONG FINGER TRIGGER RELEASE & ANNULAR CYST EXCISION   TUMOR EXCISION  age 15  right arm   Social History   Occupational History   Occupation: Retired   Occupation: Housewife  Tobacco Use   Smoking status: Never   Smokeless tobacco: Never  Vaping Use   Vaping status: Never Used  Substance and Sexual Activity   Alcohol use: No    Alcohol/week: 0.0 standard drinks of alcohol   Drug use: No   Sexual activity: Not Currently

## 2023-09-02 DIAGNOSIS — I10 Essential (primary) hypertension: Secondary | ICD-10-CM | POA: Diagnosis not present

## 2023-09-07 ENCOUNTER — Ambulatory Visit (INDEPENDENT_AMBULATORY_CARE_PROVIDER_SITE_OTHER): Admitting: Internal Medicine

## 2023-09-07 ENCOUNTER — Encounter: Payer: Self-pay | Admitting: Internal Medicine

## 2023-09-07 VITALS — BP 122/64 | HR 68 | Ht 60.0 in | Wt 119.0 lb

## 2023-09-07 DIAGNOSIS — K649 Unspecified hemorrhoids: Secondary | ICD-10-CM

## 2023-09-07 DIAGNOSIS — K625 Hemorrhage of anus and rectum: Secondary | ICD-10-CM | POA: Diagnosis not present

## 2023-09-07 DIAGNOSIS — Z860100 Personal history of colon polyps, unspecified: Secondary | ICD-10-CM

## 2023-09-07 MED ORDER — HYDROCORTISONE (PERIANAL) 2.5 % EX CREA: 1.0000 | TOPICAL_CREAM | Freq: Two times a day (BID) | CUTANEOUS | 0 refills | Status: AC

## 2023-09-07 NOTE — Progress Notes (Signed)
 Chief Complaint: Rectal bleeding  HPI : 83 year old female with history of asthma, breast cancer in remission, GERD, Parkinson disease presents for follow up of rectal bleeding  Interval History: She is having some rectal bleeding. This is bright red blood. The Anusol  cream did help. Denies rectal pain. Denies rectal itching. Sometimes she is constipated. She is just taking a stool softener. A few weeks ago she also just started taking iron just a few weeks ago. She is having one BM once every other day. Denies ab pain. 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 2011- no further tx. now- Dr. Melodye , Dr. Jason   Cyst of finger 11/2011   annular cyst right long finger   Dental crowns present    Dermatitis    Frequency of urination    GERD (gastroesophageal reflux disease)    Hemorrhoid    History of colon polyps 2009   Also noted 2012 and 2017 by colonoscopy.   History of shingles 1971   Had right ischial recurrence in the March 2019   Hyperlipidemia    On atorvastatin    Hypertension    under control, has been on med. x 2 yrs.   Liposarcoma of left shoulder (HCC) 1969   Parkinson's disease (HCC)    With mild tremor (neurologist Dr. Margaret), neurosurgeon Dr. Mavis   PONV (postoperative nausea and vomiting)    Seasonal allergies    Shingles of eyelid 1971   Right eyelid; recurrent -> last episode 05/11/2017   Tremors of nervous system    hands-essential tremor; associated with Parkinson's.   Trigger finger of right hand 11/2011   long finger     Past Surgical History:  Procedure Laterality Date   14-day Zio  Patch Monitor  08/2020   (report to be scanned): Predominant SR w/ HR 61 to 142 bpm and average 86 bpm.  Total of 59 episodes of PAT/PSVT  4 to 15 beats (not noted on diary).  Fastest was 5 beats at a rate of 193 bpm, longest was 15 beats at a rate of 106 bpm.  Rare isolated PACs (as well as couplets and triplets) with rare isolated PVCs.  No sustained arrhythmias or bradycardia to explain syncope.   ANTERIOR CERVICAL DECOMPRESSION/DISCECTOMY FUSION 4 LEVELS N/A 11/14/2013   Procedure: ANTERIOR CERVICAL DECOMPRESSION/DISCECTOMY FUSION 4 LEVELS;  Surgeon: Reyes Mavis, MD;  Location: [facility];  Service: Neurosurgery;  Laterality: N/A;  C34 C45 C56 C67 anterior cervical fusion with interbody prosthesis plating and  bonegraft   APPENDECTOMY  age 85   BREAST LUMPECTOMY  06/16/2009   left; SLN bx.   BREAST LUMPECTOMY  07/01/2009   re-excision   BREAST SURGERY  02/22/1997   reduction   CATARACT EXTRACTION, BILATERAL     COLONOSCOPY WITH PROPOFOL  N/A 12/03/2014   Procedure: COLONOSCOPY WITH PROPOFOL ;  Surgeon: Gladis MARLA Louder, MD;  Location: [facility];  Service: Endoscopy;  Laterality: N/A;   FOOT SURGERY  06/22/2011   left   KNEE ARTHROSCOPY  03/12/2005   right   KNEE ARTHROSCOPY  left   Lower Extremity Venous Dopplers  12/12/2020   No DVT bilaterally in the deep veins or superficial veins.  No deep or superficial venous reflux noted bilaterally with exception of Right SSV at the knee.   NASAL SINUS SURGERY     x 2   NEPHRECTOMY LIVING DONOR Left 04/22/2008   donated to spouse 2010([facility])   TRANSTHORACIC ECHOCARDIOGRAM  12/10/2020   EF 60 to 65%.  Normal LV size and function.  No heart WMA.  GR 1 DD.  Normal RV, RVP and RAP.SABRA  Normal valves.   TRIGGER FINGER RELEASE  12/16/2011   Procedure: RELEASE TRIGGER FINGER/A-1 PULLEY;  Surgeon: Franky JONELLE Curia, MD;  Location: [facility];  Service: Orthopedics;  Laterality: Right;  RIGHT LONG FINGER TRIGGER RELEASE & ANNULAR  CYST EXCISION   TUMOR EXCISION  age 89   right arm   Family History  Problem Relation Age of Onset   Kidney disease Mother    Stroke Father    Stroke Sister        08/2015   Diabetes Brother    Heart disease Brother    Ovarian cancer Sister 74   Heart disease Paternal Grandmother    Heart disease Paternal Grandfather    Leukemia Other 15       brother's grandson   Bladder Cancer Brother 16   Colon cancer Niece        dx in late 44s   Social History   Tobacco Use   Smoking status: Never   Smokeless tobacco: Never  Vaping Use   Vaping status: Never Used  Substance Use Topics   Alcohol use: No    Alcohol/week: 0.0 standard drinks of alcohol   Drug use: No   Current Outpatient Medications  Medication Sig Dispense Refill   albuterol  (VENTOLIN  HFA) 108 (90 Base) MCG/ACT inhaler Inhale 1-2 puffs into the lungs every 6 (six) hours as needed for wheezing or shortness of breath. 18 g 1   amLODipine  (NORVASC ) 2.5 MG tablet Take one tablet each day. 90 tablet 3   atorvastatin  (LIPITOR) 20 MG tablet Take 20 mg by mouth daily.     budesonide -formoterol  (SYMBICORT ) 160-4.5 MCG/ACT inhaler Inhale 2 puffs into the lungs as needed. 1 each 5   CALCIUM CARBONATE PO Take 1 tablet by mouth 2 (two) times daily.     Cholecalciferol (VITAMIN D ) 1000 UNITS capsule Take 1,000 Units by mouth 2 (two) times daily.     cyclobenzaprine  (FLEXERIL ) 5 MG tablet TAKE 1 TO UP TO 2 TABLETS BY MOUTH ONCE DAILY AT BEDTIME AS NEEDED FOR MUSCLE SPASM 50 tablet 11   EPINEPHrine  0.3 mg/0.3 mL IJ SOAJ injection Inject 0.3 mg into the muscle as needed. 2 each 1   famotidine  (PEPCID ) 20 MG tablet Take 1 tablet (20 mg total) by mouth daily. 30 tablet 5   hydrocortisone  (ANUSOL -HC) 2.5 % rectal cream Place 1 Application rectally 2 (two) times daily. 30 g 1   ipratropium (ATROVENT ) 0.06 % nasal spray Place 2 sprays into both nostrils 2 (two) times daily. 15 mL 5   ketotifen (ZADITOR) 0.025 % ophthalmic solution Apply 1  drop to eye daily.     levocetirizine (XYZAL ) 5 MG tablet TAKE 1 TABLET BY MOUTH ONCE DAILY IN THE EVENING . APPOINTMENT REQUIRED FOR FUTURE REFILLS 30 tablet 5   metoprolol  succinate (TOPROL -XL) 25 MG 24 hr tablet Take 12.5 mg by mouth daily. Take 1/2 Tablet In The Morning  Na Sulfate-K Sulfate-Mg Sulf 17.5-3.13-1.6 GM/177ML SOLN Use as directed; may use generic; goodrx card if insurance will not cover generic 9912 each 0   nystatin ointment (MYCOSTATIN) Apply 1 Application topically 2 (two) times daily.     Polyethyl Glycol-Propyl Glycol (SYSTANE OP) Place 1 drop into both eyes 2 (two) times daily.     primidone  (MYSOLINE ) 50 MG tablet 3 in the AM, 3 at noon, 2 at night 720 tablet 1   traMADol  (ULTRAM ) 50 MG tablet TAKE 1 TABLET BY MOUTH EVERY 12 HOURS AS NEEDED 20 tablet 0   triamcinolone  ointment (KENALOG ) 0.1 % Apply 1 Application topically as needed.     venlafaxine  (EFFEXOR ) 75 MG tablet Take 1 tablet (75 mg total) by mouth daily. 90 tablet 1   No current facility-administered medications for this visit.   Allergies  Allergen Reactions   Shellfish Allergy Shortness Of Breath   Duloxetine Itching and Rash   Sulfa Drugs Cross Reactors Anxiety and Rash   Lisinopril     Other reaction(s): cough Other reaction(s): cough Other reaction(s): cough   Lyrica  [Pregabalin ] Other (See Comments)    made me drunk, couldn't function   Other     Other reaction(s): hyper   Sulfa Antibiotics Other (See Comments)    Other reaction(s): hyper, break out in a rash Other reaction(s): hyper, break out in a rash   Codeine Anxiety    Other reaction(s): hyper Other reaction(s): hyper     Physical Exam: BP 122/64   Pulse 68   Ht 5' (1.524 m)   Wt 119 lb (54 kg)   BMI 23.24 kg/m  Constitutional: Pleasant,well-developed, female in no acute distress. HEENT: Normocephalic and atraumatic. Conjunctivae are normal. No scleral icterus. Cardiovascular: Normal rate Pulmonary/chest: Effort normal   Abdominal: Soft Rectal: Grade 2 internal hemorrhoids with denuded mucosa seen on one of the anterior columns of hemorrhoids. No anal fissure noted. Extremities: No edema Neurological: Alert and oriented to person place and time. Skin: Skin is warm and dry. No rashes noted. Psychiatric: Normal mood and affect. Behavior is normal.  Labs 07/30/20: BMP nml.   Labs 08/2022: CBC nml. Ferritin/IBC with mildly low iron sat of 19.2%  MR Pelvis w/contrast 10/06/20: IMPRESSION: Multiple small uterine fibroids, largest measuring 2.2 cm. No evidence of pedunculated or intracavitary fibroids. Nonvisualization of ovaries, however no adnexal mass identified. Right-sided sacral insufficiency fracture  05/11/2007 colonoscopy performed with removal of a 4 mm cecal adenomatous polyp.   10/27/2010 colonoscopy performed with removal of a 4 mm ascending colon tubular adenomatous polyp.   Colonoscopy 12/03/14: Assessment: Normal surveillance colonoscopy   Colonoscopy 10/18/22: - The examined portion of the ileum was normal.  - Two 3 to 6 mm polyps in the transverse colon and in the cecum, removed with a cold snare. Resected and retrieved.  - A single ( solitary) ulcer in the rectum. Biopsied. - Internal hemorrhoids Path: 1. Surgical [P], colon, transverse polyp x 1 and cecum polyp x 1, polyp (2) - SESSILE SERRATED POLYP, WITH REACTIVE/REPARATIVE CHANGE AND A SMALL LYMPHOID AGGREGATE, NEGATIVE FOR DYSPLASIA. 2. Surgical [P], colon, rectal ulcer bx - COLONIC MUCOSA WITH ULCERATION AND GRANULATION TISSUE, NONSPECIFIC - IMMUNOHISTOCHEMICAL STAINS FOR CMV AND EBV ARE NEGATIVE.  ASSESSMENT AND PLAN: Rectal bleeding Hemorrhoids History of colon polyps Patient was recently diagnosed with iron deficiency and started on iron supplements a few weeks ago. She has a hemorrhoid with an area of denuded mucosa that is likely the source of her bleeding. Her last  colonoscopy in 09/2022 did not show any other sources of  bleeding. I asked her to start taking Miralax every other day while she is on oral iron supplements. Will have her use topical steroids and stool softeners/fiber supplements as well.  - Cont daily fiber supplement  - Cont daily stool softener - Start Miralax every other day while on oral iron supplements - Sitz baths - Anusol  HC cream BID for 7 days - RTC PRN  Estefana Kidney, MD  I spent 35 minutes of time, including in depth chart review, independent review of results as outlined above, communicating results with the patient directly, face-to-face time with the patient, coordinating care, and ordering studies and medications as appropriate, and documentation.

## 2023-09-07 NOTE — Patient Instructions (Addendum)
 We have sent the following medications to your pharmacy for you to pick up at your convenience: Hydrocortisone  use 2 time daily for 7 days  Start taking Miralax every other day  Start taking a daily fiber supplement    What is a sitz bath? A sitz bath is a warm soothing soak for your perineal or bottom area (area between your legs including your anus and scrotum). The soak is made up of water and baking soda (sodium bicarbonate) or salt. You can buy baking soda or salt in a drug or grocery store. How do I take a sitz bath? There are 2 ways you can take a sitz bath.  You may use: 1. A plastic sitz bath that fits onto your toilet.  2. Your bathtub at home.  Sitz baths are available in the hospital or at your local drug store. Using a plastic sitz bath on the toilet 1. Rinse the sitz bath to remove any soap or salt residue. 2. Lift the toilet seat and put the plastic sitz bath in the toilet bowl. 3. Fill the plastic sitz bath two-thirds (2/3) full with warm water, not hot water. The water temperature should be 37 Celsius to 39  Celsius or 99 Farhenheit to 102 Farhenheit. If the water feels too warm on your wrist, it is too hot to sit in. 4. Add  to 1 tablespoon (5 mL to 15 mL) of baking soda or 1 to 2 teaspoons (5 mL to 10 mL) of salt to the water in the plastic sitz bath. Swirl the water until the baking soda or salt dissolves. 5. Carefully sit down in the plastic sitz bath and soak your bottom area for 10 to 15 minutes. As you sit down, the extra water will spill into the toilet through the openings in the plastic sitz bath. 6. When finished, dry your bottom by patting the area with a clean lint-free towel. Another way to dry the area is to use a blow dryer set on low or use a hand-held fan. You could also lie down and rest as your bottom area dries. 7. Try and leave your bottom open to the air as much as possible. Use cotton underwear with no elastic along the leg holes. Oversized boxer  shorts are great. 8. Clean the plastic sitz bath each time you use it. Using your bathtub at home 1. Rinse the bathtub before using to remove any soap or salt residue. 2. Fill the bathtub with enough warm water to cover your thighs, or about 5 inches deep. 3. The temperature of the water should be warm, not hot--about 99 Fahrenheit to 102 Fahrenheit. If the water feels too warm on your wrist, it is too hot to sit in. 4. Add  cup (125 mL) of baking soda or  cup (75 mL) of salt to the bath water. Swirl the water until the baking soda or salt is dissolved. 5. Carefully enter the bath, sit down and soak your bottom area for 10 to 15 minutes. Lean backwards in the tub, rather than sitting directly on your bottom, so the water can reach the whole area. 6. When finished get out of the bath and dry your bottom by patting the area with a clean, lint-free towel. Another way to dry the area is to use a blow dryer set on low or use a hand-held  fan. You could also lie down and rest until your bottom area dries. 7. Try and leave your bottom open to the  air as much as possible. Use cotton underwear with no elastic along the leg holes. Oversized boxer shorts are great.  Thank you for entrusting me with your care and for choosing [facility], Dr. Estefana Kidney

## 2023-09-22 DIAGNOSIS — C50512 Malignant neoplasm of lower-outer quadrant of left female breast: Secondary | ICD-10-CM | POA: Diagnosis not present

## 2023-09-22 DIAGNOSIS — F3341 Major depressive disorder, recurrent, in partial remission: Secondary | ICD-10-CM | POA: Diagnosis not present

## 2023-09-22 DIAGNOSIS — G20A1 Parkinson's disease without dyskinesia, without mention of fluctuations: Secondary | ICD-10-CM | POA: Diagnosis not present

## 2023-09-22 DIAGNOSIS — I10 Essential (primary) hypertension: Secondary | ICD-10-CM | POA: Diagnosis not present

## 2023-09-22 DIAGNOSIS — E785 Hyperlipidemia, unspecified: Secondary | ICD-10-CM | POA: Diagnosis not present

## 2023-10-03 ENCOUNTER — Encounter: Payer: Self-pay | Admitting: Sports Medicine

## 2023-10-03 ENCOUNTER — Ambulatory Visit: Admitting: Sports Medicine

## 2023-10-03 DIAGNOSIS — M25561 Pain in right knee: Secondary | ICD-10-CM

## 2023-10-03 DIAGNOSIS — G8929 Other chronic pain: Secondary | ICD-10-CM | POA: Diagnosis not present

## 2023-10-03 DIAGNOSIS — M25562 Pain in left knee: Secondary | ICD-10-CM | POA: Diagnosis not present

## 2023-10-03 DIAGNOSIS — M170 Bilateral primary osteoarthritis of knee: Secondary | ICD-10-CM

## 2023-10-03 NOTE — Addendum Note (Signed)
 Addended by: Colinda Barth W III on: 10/03/2023 05:30 PM   Modules accepted: Orders

## 2023-10-03 NOTE — Progress Notes (Addendum)
 April Church - 83 y.o. female MRN [id number]  Date of birth: Mar 06, 1940  Office Visit Note: Visit Date: 10/03/2023 PCP: Elliot Charm, MD Referred by: Elliot Charm,*  Subjective: Chief Complaint  Patient presents with   Right Knee - Follow-up   Left Knee - Follow-up   HPI: April Church is a pleasant 83 y.o. female who presents today for chronic bilateral knee pain with advanced OA.  We did perform viscosupplementation injections on 07/21/2023, followed by corticosteroid injections for each knee on 08/22/2023.  After the combination of these injections, she did receive fairly good relief for the first 3 to 4 weeks and then her pain slowly started returning.  She has done quite a bit of formalized physical therapy on land, but has not performed aquatic-based PT yet.  She is open to this.  She has been using her cane when getting around in the house.  She has pain worse in the morning that was slightly improve as the day goes on but then pain later on as well.  Her right knee is worse than her left knee.  Pertinent ROS were reviewed with the patient and found to be negative unless otherwise specified above in HPI.   Assessment & Plan: Visit Diagnoses:  1. Bilateral primary osteoarthritis of knee   2. Chronic pain of both knees    Plan: Impression is acute on chronic bilateral knee pain with advanced osteoarthritis, right knee greater than left.  She did receive better relief with corticosteroid injection in conjunction with previous viscosupplementation back in May and June subsequently.  This however only gave her relief for just under 1 month and then her pain returned.  Her x-ray findings are advanced enough where she may be a candidate for TKA, although she would like to try anything prior to this if possible.  We had a discussion today regarding a trial of genicular nerve block (prior to RFA or other longer-acting therapies).  Discussed the nature of this  and the expected response.  She is open to trialing this.  She would like to move forward with right knee first.  We will bring her back for ultrasound-guided genicular nerve block for the right knee and see what sort of response she has to this.  She may use Tylenol  as needed, avoid NSAIDs given her solitary kidney.  I am okay with her continuing tramadol  50 mg to take for breakthrough pain only.  We would like to pare this with aquatic-based physical therapy to help with movement of the knees but less aggravation/irritation from land PT.  Referral sent today.  Did discuss that TKA could be an option, at least having her see one of my knee surgeons depending on her improvement from the GNB.   Follow-up: Return in about 11 days (around 10/14/2023) for Setup for US -guided R-knee genicular nerve block (30-mins).   Meds & Orders: No orders of the defined types were placed in this encounter.  No orders of the defined types were placed in this encounter.    Procedures: No procedures performed      Clinical History: No specialty comments available.  She reports that she has never smoked. She has never used smokeless tobacco. No results for input(s): HGBA1C, LABURIC in the last 8760 hours.  Objective:    Physical Exam  Gen: Well-appearing, in no acute distress; non-toxic CV: Well-perfused. Warm.  Resp: Breathing unlabored on room air; no wheezing. Psych: Fluid speech in conversation; appropriate affect; normal thought process  Ortho Exam - Bilateral knees: Range of motion is limited right knee 8-100 degrees, left knee 5-1 110/115 degrees.  There is pseudo instability on the right knee with valgus testing.  There is notable bilateral patellar crepitus.  There is no notable effusions.  Imaging:  *We did review bilateral knee x-rays today as below.  12/23/22: 4 views of bilateral knees including AP standing, Rosenberg, lateral and  sunrise view were ordered and reviewed by myself today.   X-rays show  complete bone-on-bone arthritic change of the medial tibiofemoral joint  space as well as advanced tricompartmental change.  There is notable bony  sclerosis over the medial femoral condyle and to a lesser degree the  medial tibial plateau of bilateral knees.  No acute fracture noted.  There  is varus formation of both knees.   Past Medical/Family/Surgical/Social History: Medications & Allergies reviewed per EMR, new medications updated. Patient Active Problem List   Diagnosis Date Noted   Rectal bleeding 07/27/2022   Hemorrhoids 07/27/2022   Chronic pain of both knees 06/21/2022   Moderate persistent asthma without complication 03/27/2021   PSVT (paroxysmal supraventricular tachycardia) (HCC) 11/26/2020   Syncope and collapse 11/24/2020   Primary osteoarthritis of right knee 05/12/2020   Primary osteoarthritis of left knee 05/12/2020   Hyperlipidemia    Bilateral calf pain 03/05/2019   Genetic testing 02/13/2019   Family history of ovarian cancer    Family history of bladder cancer    Family history of colon cancer    Family history of leukemia    Lumbar radiculopathy 12/08/2018   Myofascial pain 12/08/2018   Impaired gait and mobility 12/08/2018   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   Chronic nonallergic rhinitis 12/29/2016   Mild persistent asthma, uncomplicated 12/29/2016   Vocal fold paralysis, 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 2011- no further tx. now- Dr. Melodye , Dr. Jason    Cyst of finger 11/2011   annular cyst right long finger   Dental crowns present    Dermatitis    Frequency of urination    GERD (gastroesophageal reflux disease)    Hemorrhoid    History of colon polyps 2009   Also noted 2012 and 2017 by colonoscopy.   History of shingles 1971   Had right ischial recurrence in the March 2019   Hyperlipidemia    On atorvastatin    Hypertension    under control, has been on med. x 2 yrs.   Liposarcoma of left shoulder (HCC) 1969   Parkinson's disease (HCC)    With mild tremor (neurologist Dr. Margaret), neurosurgeon Dr. Mavis   PONV (postoperative nausea and vomiting)    Seasonal allergies    Shingles of eyelid 1971   Right eyelid; recurrent -> last episode 05/11/2017   Tremors of nervous system    hands-essential tremor; associated with Parkinson's.   Trigger finger of right hand 11/2011   long finger   Family History  Problem Relation Age of Onset   Kidney disease Mother    Stroke Father    Stroke Sister  08/2015   Diabetes Brother    Heart disease Brother    Ovarian cancer Sister 2   Heart disease Paternal Grandmother    Heart disease Paternal Grandfather    Leukemia Other 15       brother's grandson   Bladder Cancer Brother 49   Colon cancer Niece        dx in late 87s   Past Surgical History:  Procedure Laterality Date   14-day Zio Patch Monitor  08/2020   (report to be scanned): Predominant SR w/ HR 61 to 142 bpm and average 86 bpm.  Total of 59 episodes of PAT/PSVT  4 to 15 beats (not noted on diary).  Fastest was 5 beats at a rate of 193 bpm, longest was 15 beats at a rate of 106 bpm.  Rare isolated PACs (as well as couplets and triplets) with rare isolated PVCs.  No sustained arrhythmias or bradycardia to explain syncope.   ANTERIOR CERVICAL DECOMPRESSION/DISCECTOMY FUSION 4 LEVELS N/A 11/14/2013   Procedure: ANTERIOR CERVICAL DECOMPRESSION/DISCECTOMY FUSION 4 LEVELS;  Surgeon: Reyes Budge, MD;  Location: [facility];  Service: Neurosurgery;  Laterality: N/A;  C34 C45 C56 C67 anterior cervical fusion with interbody prosthesis plating and  bonegraft   APPENDECTOMY  age 61   BREAST LUMPECTOMY  06/16/2009   left; SLN bx.   BREAST LUMPECTOMY  07/01/2009   re-excision   BREAST SURGERY  02/22/1997   reduction   CATARACT EXTRACTION, BILATERAL     COLONOSCOPY WITH PROPOFOL  N/A 12/03/2014   Procedure: COLONOSCOPY WITH PROPOFOL ;  Surgeon: Gladis MARLA Louder, MD;  Location: [facility];  Service: Endoscopy;  Laterality: N/A;   FOOT SURGERY  06/22/2011   left   KNEE ARTHROSCOPY  03/12/2005   right   KNEE ARTHROSCOPY     left   Lower Extremity Venous Dopplers  12/12/2020   No DVT bilaterally in the deep veins or superficial veins.  No deep or superficial venous reflux noted bilaterally with exception of Right SSV at the knee.   NASAL SINUS SURGERY     x 2   NEPHRECTOMY LIVING DONOR Left 04/22/2008   donated to spouse 2010([facility])   TRANSTHORACIC ECHOCARDIOGRAM  12/10/2020   EF 60 to 65%.  Normal LV size and function.  No heart WMA.  GR 1 DD.  Normal RV, RVP and RAP.SABRA  Normal valves.   TRIGGER FINGER RELEASE  12/16/2011   Procedure: RELEASE TRIGGER FINGER/A-1 PULLEY;  Surgeon: Franky JONELLE Curia, MD;  Location: [facility];  Service: Orthopedics;  Laterality: Right;  RIGHT LONG FINGER TRIGGER RELEASE & ANNULAR CYST EXCISION   TUMOR EXCISION  age 60   right arm   Social History   Occupational History   Occupation: Retired   Occupation: Housewife  Tobacco Use   Smoking status: Never   Smokeless tobacco: Never  Vaping Use   Vaping status: Never Used  Substance and Sexual Activity   Alcohol use: No    Alcohol/week: 0.0 standard drinks of alcohol   Drug use: No   Sexual activity: Not Currently

## 2023-10-03 NOTE — Progress Notes (Signed)
 Patient says that she noticed some relief from the injections a couple of days after getting them, and this relief lasted a couple of weeks. She says that she has noticed that her pain seems worse when she first gets up in the mornings. She has been using a cane in the house, which she did not previously have to do.

## 2023-10-11 ENCOUNTER — Telehealth: Payer: Self-pay | Admitting: Sports Medicine

## 2023-10-11 NOTE — Telephone Encounter (Signed)
 Patient daughter  called and wants you to about the nerve block. CB#[phone]

## 2023-10-11 NOTE — Telephone Encounter (Signed)
 Called patient and patient's daughter back. Clarified that this would be an injection using the ultrasound machine, which is similar to previous injections. Advised that because it is the right knee, Dr. Burnetta recommends bringing a driver, and 48 hours of modified activity after the injection.

## 2023-10-14 ENCOUNTER — Other Ambulatory Visit: Payer: Self-pay

## 2023-10-14 ENCOUNTER — Ambulatory Visit (INDEPENDENT_AMBULATORY_CARE_PROVIDER_SITE_OTHER): Admitting: Sports Medicine

## 2023-10-14 ENCOUNTER — Encounter: Payer: Self-pay | Admitting: Sports Medicine

## 2023-10-14 DIAGNOSIS — G8929 Other chronic pain: Secondary | ICD-10-CM | POA: Diagnosis not present

## 2023-10-14 DIAGNOSIS — M25561 Pain in right knee: Secondary | ICD-10-CM

## 2023-10-14 DIAGNOSIS — M170 Bilateral primary osteoarthritis of knee: Secondary | ICD-10-CM | POA: Diagnosis not present

## 2023-10-14 NOTE — Progress Notes (Signed)
   Procedure Note  Patient: April Church             Date of Birth: 1940/09/06           MRN: [id number]             Visit Date: 10/14/2023  Procedures: Visit Diagnoses:  1. Chronic pain of right knee   2. Bilateral primary osteoarthritis of knee    PRE-OP DIAGNOSIS: R-knee osteoarthritis PROCEDURE: Right knee genicular nerve blocks (injection) under ultrasound-guidance Performing Physician: Lonell Sprang, DO   Total Dose:      Bupivacaine  0.25%      16 mL (4 mL each location)                          Depo-medrol  40 mg/ml -  1mL (0.25 mL each location)   R/B/A of procedure provided.  Risks of infection, bleeding, damage to other structures and nerves explained.    Procedure: The area was prepped in the usual sterile manner with Chloraprep and multiple alcohol swabs. Sterile ultrasound gel was applied.  Ultrasound was used to identify the femoral shaft and condyles, along with the tibia shaft and head on the side noted above.  The inflection points were noted at the areas of the superior medial, superior lateral, inferomedial genicular nerves, as well as superior (nerve to vastus intermedius) genicular nerves. A 22-gauge, 1.5 inch  needle was inserted through the skin and to the area of the nerves.  After negative aspiration, 2 ml of the above solution was injected at each of the 4 sites above using a walk-down-approach. Ultrasound demonstrates dynamic spread of the injectate along the cortex of bone and surrounding above genicular nerves. There were no complications during this procedure.   Patient reported relief upon standing after the procedure.   Follow-up: The patient tolerated the procedure well without complications.  Standard post-procedure care is explained and return precautions are given.    - she will see me back in 3 weeks to re-evaluate and gauge improvement/relief - patient did bring a driver today  Lonell Sprang, DO Primary Care Sports Medicine Physician  [facility] - Orthopedics  This note was dictated using Dragon naturally speaking software and may contain errors in syntax, spelling, or content which have not been identified prior to signing this note.

## 2023-10-23 DIAGNOSIS — F3341 Major depressive disorder, recurrent, in partial remission: Secondary | ICD-10-CM | POA: Diagnosis not present

## 2023-10-23 DIAGNOSIS — G20A1 Parkinson's disease without dyskinesia, without mention of fluctuations: Secondary | ICD-10-CM | POA: Diagnosis not present

## 2023-10-23 DIAGNOSIS — E785 Hyperlipidemia, unspecified: Secondary | ICD-10-CM | POA: Diagnosis not present

## 2023-10-23 DIAGNOSIS — I10 Essential (primary) hypertension: Secondary | ICD-10-CM | POA: Diagnosis not present

## 2023-10-23 DIAGNOSIS — C50512 Malignant neoplasm of lower-outer quadrant of left female breast: Secondary | ICD-10-CM | POA: Diagnosis not present

## 2023-11-02 ENCOUNTER — Other Ambulatory Visit: Payer: Self-pay

## 2023-11-02 ENCOUNTER — Encounter: Payer: Self-pay | Admitting: Allergy

## 2023-11-02 ENCOUNTER — Ambulatory Visit (INDEPENDENT_AMBULATORY_CARE_PROVIDER_SITE_OTHER): Admitting: Allergy

## 2023-11-02 VITALS — BP 116/82 | HR 78 | Temp 97.4°F | Resp 18

## 2023-11-02 DIAGNOSIS — J310 Chronic rhinitis: Secondary | ICD-10-CM | POA: Diagnosis not present

## 2023-11-02 DIAGNOSIS — J4540 Moderate persistent asthma, uncomplicated: Secondary | ICD-10-CM | POA: Diagnosis not present

## 2023-11-02 DIAGNOSIS — K219 Gastro-esophageal reflux disease without esophagitis: Secondary | ICD-10-CM | POA: Diagnosis not present

## 2023-11-02 DIAGNOSIS — J3802 Paralysis of vocal cords and larynx, bilateral: Secondary | ICD-10-CM

## 2023-11-02 DIAGNOSIS — T781XXD Other adverse food reactions, not elsewhere classified, subsequent encounter: Secondary | ICD-10-CM | POA: Diagnosis not present

## 2023-11-02 NOTE — Patient Instructions (Addendum)
 Asthma Lung function is stable For now use Breztri 2 puffs twice a day.  Samples provided.  Let me know after you finish first sample if the cough is better.  If so then will send prescription for Breztri.   Hold Symbicort  inhaler for now while using Breztri.  Continue albuterol  2 puff into the lungs every 4-6 hours as needed for shortness of breath or wheezing Have access to albuterol  inhaler 2 puffs every 4-6 hours as needed for cough/wheeze/shortness of breath/chest tightness.  May use 15-20 minutes prior to activity.   Monitor frequency of use.    Chronic nonallergic rhinitis Change Allegra to Xyzal  5mg  daily.  Rotate between Allegra and Xyzal  every 3-6 months to maintain efficacy.  Use Atrovent  0.06% use 2 sprays in each nostril twice a day for a runny nose control. With using nasal sprays point tip of bottle toward eye on same side nostril and lean head slightly forward for best technique.   Consider saline nasal rinses as needed for nasal symptoms. Use this before any medicated nasal sprays for best result  Vocal fold paralysis, bilateral Continue routine follow-up with Dr. Brien with ENT as needed  GERD Continue famotidine  20 mg once a day as you have been Continue dietary and lifestyle modifications  Food allergy Continue avoidance of shellfish and wine  In case of an allergic reaction, take Benadryl  50 mg every 4 hours, and if life-threatening symptoms occur, inject with EpiPen  0.3 mg.  Follow up in 6 months or sooner if needed

## 2023-11-02 NOTE — Progress Notes (Signed)
 Follow-up Note  RE: April Church MRN: [id number] DOB: August 12, 1940 Date of Office Visit: 11/02/2023   History of present illness: April Church is a 83 y.o. female presenting today for follow-up of asthma, nonallergic rhinitis, vocal cord paralysis allergy.  She was last seen in the office on 05/04/2023 by myself. Discussed the use of AI scribe software for clinical note transcription with the patient, who gave verbal consent to proceed.  She has a persistent cough that occurs primarily in the morning and at night, despite using Symbicort  inhaler twice daily. She does not use her albuterol  inhaler for this cough and continues to use a spacer with her inhaler. The cough has been ongoing since the last visit in March.  She uses Atrovent  nasal spray, which helps reduce sinus mucus drainage into her throat. She takes famotidine  every morning and reports no issues with reflux symptoms. She has not needed to use her EpiPen  recently. She was advised to switch from Allegra to another allergy medication but has not done so yet, as she feels Allegra is no longer effective.  She experiences hoarseness and has a history of receiving Botox injections for this, but stopped after the last injection resulted in a four-week period of voice loss.  Since the last visit, she has started using a cane due to a couple of falls, which resulted in facial injuries but no fractures.     Review of systems: 10pt ROS negative unless noted above in HPI  Past medical/social/surgical/family history have been reviewed and are unchanged unless specifically indicated below.  No changes  Medication List: Current Outpatient Medications  Medication Sig Dispense Refill   albuterol  (VENTOLIN  HFA) 108 (90 Base) MCG/ACT inhaler Inhale 1-2 puffs into the lungs every 6 (six) hours as needed for wheezing or shortness of breath. 18 g 1   amLODipine  (NORVASC ) 2.5 MG tablet Take one tablet each day. 90 tablet 3    atorvastatin  (LIPITOR) 20 MG tablet Take 20 mg by mouth daily.     budesonide -formoterol  (SYMBICORT ) 160-4.5 MCG/ACT inhaler Inhale 2 puffs into the lungs as needed. 1 each 5   CALCIUM CARBONATE PO Take 1 tablet by mouth 2 (two) times daily.     Cholecalciferol (VITAMIN D ) 1000 UNITS capsule Take 1,000 Units by mouth 2 (two) times daily.     cyclobenzaprine  (FLEXERIL ) 5 MG tablet TAKE 1 TO UP TO 2 TABLETS BY MOUTH ONCE DAILY AT BEDTIME AS NEEDED FOR MUSCLE SPASM 50 tablet 11   EPINEPHrine  0.3 mg/0.3 mL IJ SOAJ injection Inject 0.3 mg into the muscle as needed. 2 each 1   famotidine  (PEPCID ) 20 MG tablet Take 1 tablet (20 mg total) by mouth daily. 30 tablet 5   ipratropium (ATROVENT ) 0.06 % nasal spray Place 2 sprays into both nostrils 2 (two) times daily. 15 mL 5   ketotifen (ZADITOR) 0.025 % ophthalmic solution Apply 1 drop to eye daily.     levocetirizine (XYZAL ) 5 MG tablet TAKE 1 TABLET BY MOUTH ONCE DAILY IN THE EVENING . APPOINTMENT REQUIRED FOR FUTURE REFILLS 30 tablet 5   metoprolol  succinate (TOPROL -XL) 25 MG 24 hr tablet Take 12.5 mg by mouth daily. Take 1/2 Tablet In The Morning     Na Sulfate-K Sulfate-Mg Sulf 17.5-3.13-1.6 GM/177ML SOLN Use as directed; may use generic; goodrx card if insurance will not cover generic 9912 each 0   nystatin ointment (MYCOSTATIN) Apply 1 Application topically 2 (two) times daily.     Polyethyl Glycol-Propyl Glycol (SYSTANE  OP) Place 1 drop into both eyes 2 (two) times daily.     primidone  (MYSOLINE ) 50 MG tablet 3 in the AM, 3 at noon, 2 at night 720 tablet 1   traMADol  (ULTRAM ) 50 MG tablet TAKE 1 TABLET BY MOUTH EVERY 12 HOURS AS NEEDED 20 tablet 0   triamcinolone  ointment (KENALOG ) 0.1 % Apply 1 Application topically as needed.     venlafaxine  (EFFEXOR ) 75 MG tablet Take 1 tablet (75 mg total) by mouth daily. 90 tablet 1   No current facility-administered medications for this visit.     Known medication allergies: Allergies  Allergen Reactions    Shellfish Allergy Shortness Of Breath   Duloxetine Itching and Rash   Sulfa Drugs Cross Reactors Anxiety and Rash   Lisinopril     Other reaction(s): cough Other reaction(s): cough Other reaction(s): cough   Lyrica  [Pregabalin ] Other (See Comments)    made me drunk, couldn't function   Other     Other reaction(s): hyper   Sulfa Antibiotics Other (See Comments)    Other reaction(s): hyper, break out in a rash Other reaction(s): hyper, break out in a rash   Codeine Anxiety    Other reaction(s): hyper Other reaction(s): hyper     Physical examination: Blood pressure 116/82, pulse 78, temperature (!) 97.4 F (36.3 C), resp. rate 18, SpO2 96%.  General: Alert, interactive, in no acute distress, hoarse voice. HEENT: PERRLA, TMs pearly gray, turbinates minimally edematous without discharge, post-pharynx non erythematous. Neck: Supple without lymphadenopathy. Lungs: Clear to auscultation without wheezing, rhonchi or rales. {no increased work of breathing. CV: Normal S1, S2 without murmurs. Abdomen: Nondistended, nontender. Skin: Warm and dry, without lesions or rashes. Extremities:  No clubbing, cyanosis or edema. Neuro:   Grossly intact.  Diagnostics/Labs:  Spirometry: FEV1: 0.99L 62%, FVC: 1.41L 67% predicted.  This is quite stable for lung function  Assessment and plan: Asthma Lung function is stable For now use Breztri 2 puffs twice a day.  Samples provided.  Let me know after you finish first sample if the cough is better.  If so then will send prescription for Breztri.   Hold Symbicort  inhaler for now while using Breztri.  Continue albuterol  2 puff into the lungs every 4-6 hours as needed for shortness of breath or wheezing Have access to albuterol  inhaler 2 puffs every 4-6 hours as needed for cough/wheeze/shortness of breath/chest tightness.  May use 15-20 minutes prior to activity.   Monitor frequency of use.    Chronic nonallergic rhinitis Change Allegra to Xyzal   5mg  daily.  Rotate between Allegra and Xyzal  every 3-6 months to maintain efficacy.  Use Atrovent  0.06% use 2 sprays in each nostril twice a day for a runny nose control. With using nasal sprays point tip of bottle toward eye on same side nostril and lean head slightly forward for best technique.   Consider saline nasal rinses as needed for nasal symptoms. Use this before any medicated nasal sprays for best result  Vocal fold paralysis, bilateral Continue routine follow-up with Dr. Brien with ENT as needed  GERD Continue famotidine  20 mg once a day as you have been Continue dietary and lifestyle modifications  Food allergy Continue avoidance of shellfish and wine  In case of an allergic reaction, take Benadryl  50 mg every 4 hours, and if life-threatening symptoms occur, inject with EpiPen  0.3 mg.  Follow up in 6 months or sooner if needed   I appreciate the opportunity to take part in Zipporah's care. Please  do not hesitate to contact me with questions.  Sincerely,   Danita Brain, MD Allergy/Immunology [facility]

## 2023-11-04 ENCOUNTER — Ambulatory Visit (INDEPENDENT_AMBULATORY_CARE_PROVIDER_SITE_OTHER): Admitting: Sports Medicine

## 2023-11-04 ENCOUNTER — Other Ambulatory Visit: Payer: Self-pay

## 2023-11-04 ENCOUNTER — Encounter: Payer: Self-pay | Admitting: Sports Medicine

## 2023-11-04 DIAGNOSIS — G8929 Other chronic pain: Secondary | ICD-10-CM | POA: Diagnosis not present

## 2023-11-04 DIAGNOSIS — M25562 Pain in left knee: Secondary | ICD-10-CM | POA: Diagnosis not present

## 2023-11-04 DIAGNOSIS — M170 Bilateral primary osteoarthritis of knee: Secondary | ICD-10-CM

## 2023-11-04 DIAGNOSIS — M25561 Pain in right knee: Secondary | ICD-10-CM | POA: Diagnosis not present

## 2023-11-04 NOTE — Progress Notes (Signed)
 Patient says that her right knee is doing much better, and she is able to get around the house more without her cane. She does still have trouble if she turns the wrong way, but is overall pleased. She says that she feels the left knee more now that the right is improved.

## 2023-11-04 NOTE — Progress Notes (Signed)
   Procedure Note  Patient: April Church             Date of Birth: 07/09/1940           MRN: [id number]             Visit Date: 11/04/2023  Procedures: Visit Diagnoses:  1. Bilateral primary osteoarthritis of knee   2. Chronic pain of left knee    PRE-OP DIAGNOSIS: L-knee osteoarthritis PROCEDURE: Left knee genicular nerve blocks (injection) under ultrasound-guidance Performing Physician: Lonell Sprang, DO   Total Dose:      Lidocaine  1% - 16 mL (4 mL each location)                          Depo-medrol  40 mg/ml -  1mL (0.25 mL each location)   R/B/A of procedure provided.  Risks of infection, bleeding, damage to other structures and nerves explained.    Procedure: The area was prepped in the usual sterile manner with Chloraprep and multiple alcohol swabs. Sterile ultrasound gel was applied.  Ultrasound was used to identify the femoral shaft and condyles, along with the tibia shaft and head on the side noted above. The inflection points were noted at the areas of the superior medial, superior lateral, inferomedial genicular nerves, as well as superior (nerve to vastus intermedius) genicular nerves. A 22-gauge, 1.5 inch  needle was inserted through the skin and to the area of the nerves.  After negative aspiration, 4 ml of the above solution was injected at each of the 4 sites above using a walk-down-approach. Ultrasound demonstrates dynamic spread of the injectate along the cortex of bone and surrounding above genicular nerves. There were no complications during this procedure.   Patient reported relief upon standing after the procedure.   Follow-up: The patient tolerated the procedure well without complications.  Standard post-procedure care is explained and return precautions are given.  Patient did have a driver that brought her to the visit today and drove her home.

## 2023-11-04 NOTE — Progress Notes (Signed)
 April Church - 83 y.o. female MRN [id number]  Date of birth: [birth year]  Office Visit Note: Visit Date: 11/04/2023 PCP: Elliot Charm, MD Referred by: Elliot Charm,*  Subjective: Chief Complaint  Patient presents with   Right Knee - Follow-up   HPI: April Church is a pleasant 83 y.o. female who presents today for follow-up of bilateral knee osteoarthritis with previous genicular nerve block injections for the right knee.  We did perform ultrasound-guided genicular nerve block for the right knee back on 10/03/2023.  Mckynleigh received good relief from these injections/blocks.  She had rather significant relief initially and at this point feels she is at least 60% improved since prior to the injection.  She also notes that she has been able to use her cane less with ambulating as she feels more stable on the knee.  Given the success with her right knee, she would like to move forward with genicular nerve block injections for the left knee.  In the past she did get about 1 month of relief of previous corticosteroid injection and some relief from viscosupplementation, but the genicular nerve block does seem to help more.  Otherwise she is using Tylenol  for pain control.  Pertinent ROS were reviewed with the patient and found to be negative unless otherwise specified above in HPI.   Assessment & Plan: Visit Diagnoses:  1. Chronic pain of left knee   2. Bilateral primary osteoarthritis of knee   3. Chronic pain of right knee    Plan: Impression is bilateral knee osteoarthritis with current exacerbation of her left knee pain.  Her right knee received rather good pain relief and improvement in function after previous genicular nerve block just over 1 month ago.  She received at least 60% reduction in her pain and has been able to ambulate without the use of her cane more so throughout the day.  Earnestine desire to proceed with left knee genicular nerve block,  which we did perform today under ultrasound guidance.  Tolerated procedure well, advised on postinjection protocol today which I discussed with her and her daughter during the visit today.  He may use Tylenol , ice/heat as needed.  She does have tramadol  50 mg which she received previously from Dr. Lovvorn that she may take for breakthrough pain only.  As a reminder, she did get about 1 month of relief from corticosteroid injection and did get partial relief from viscosupplementation -these are both things we could consider in the future as she is wishing to try anything before considering knee replacement.  However, it does seem the genicular nerve block injection has helped her more than any of the previous treatments.  Follow-up: Return in about 2 months (around 01/04/2024) for Bilateral knees  Meds & Orders: No orders of the defined types were placed in this encounter.   Orders Placed This Encounter  Procedures   US  Guided Needle Placement - No Linked Charges     Procedures: - See separate procedure note      Clinical History: No specialty comments available.  She reports that she has never smoked. She has never used smokeless tobacco. No results for input(s): HGBA1C, LABURIC in the last 8760 hours.  Objective:    Physical Exam  Gen: Well-appearing, in no acute distress; non-toxic CV: Well-perfused. Warm.  Resp: Breathing unlabored on room air; no wheezing. Psych: Fluid speech in conversation; appropriate affect; normal thought process  Ortho Exam - Bilateral knees: No redness swelling or effusion of either knee.  There is restriction in range of motion with right knee about 8-105 degrees and left knee 5-110 degrees.  Patient does walk with a slow gait with the assistance of a cane.  There is patellar crepitus noted bilaterally.  Imaging:  12/23/22: 4 views of bilateral knees including AP standing, Rosenberg, lateral and  sunrise view were ordered and reviewed by myself  today.  X-rays show  complete bone-on-bone arthritic change of the medial tibiofemoral joint  space as well as advanced tricompartmental change.  There is notable bony  sclerosis over the medial femoral condyle and to a lesser degree the  medial tibial plateau of bilateral knees.  No acute fracture noted.  There  is varus formation of both knees.   Past Medical/Family/Surgical/Social History: Medications & Allergies reviewed per EMR, new medications updated. Patient Active Problem List   Diagnosis Date Noted   Rectal bleeding 07/27/2022   Hemorrhoids 07/27/2022   Chronic pain of both knees 06/21/2022   Moderate persistent asthma without complication 03/27/2021   PSVT (paroxysmal supraventricular tachycardia) (HCC) 11/26/2020   Syncope and collapse 11/24/2020   Primary osteoarthritis of right knee 05/12/2020   Primary osteoarthritis of left knee 05/12/2020   Hyperlipidemia    Bilateral calf pain 03/05/2019   Genetic testing 02/13/2019   Family history of ovarian cancer    Family history of bladder cancer    Family history of colon cancer    Family history of leukemia    Lumbar radiculopathy 12/08/2018   Myofascial pain 12/08/2018   Impaired gait and mobility 12/08/2018   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   Chronic nonallergic rhinitis 12/29/2016   Mild persistent asthma, uncomplicated 12/29/2016   Vocal fold paralysis, 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 2011- no further tx. now- Dr. Melodye , Dr.  Jason   Cyst of finger 11/2011   annular cyst right long finger   Dental crowns present    Dermatitis    Frequency of urination    GERD (gastroesophageal reflux disease)    Hemorrhoid    History of colon polyps 2009   Also noted 2012 and 2017 by colonoscopy.   History of shingles 1971   Had right ischial recurrence in the March 2019   Hyperlipidemia    On atorvastatin    Hypertension    under control, has been on med. x 2 yrs.   Liposarcoma of left shoulder (HCC) 1969   Parkinson's disease (HCC)    With mild tremor (neurologist Dr. Margaret), neurosurgeon Dr. Mavis   PONV (postoperative nausea and vomiting)    Seasonal allergies    Shingles of eyelid 1971   Right eyelid; recurrent -> last episode 05/11/2017   Tremors of nervous system    hands-essential tremor; associated with Parkinson's.   Trigger finger of right hand 11/2011   long finger   Family History  Problem Relation Age of Onset   Kidney disease Mother    Stroke Father    Stroke Sister        08/2015   Diabetes Brother  Heart disease Brother    Ovarian cancer Sister 62   Heart disease Paternal Grandmother    Heart disease Paternal Grandfather    Leukemia Other 15       brother's grandson   Bladder Cancer Brother 26   Colon cancer Niece        dx in late 53s   Past Surgical History:  Procedure Laterality Date   14-day Zio Patch Monitor  08/2020   (report to be scanned): Predominant SR w/ HR 61 to 142 bpm and average 86 bpm.  Total of 59 episodes of PAT/PSVT  4 to 15 beats (not noted on diary).  Fastest was 5 beats at a rate of 193 bpm, longest was 15 beats at a rate of 106 bpm.  Rare isolated PACs (as well as couplets and triplets) with rare isolated PVCs.  No sustained arrhythmias or bradycardia to explain syncope.   ANTERIOR CERVICAL DECOMPRESSION/DISCECTOMY FUSION 4 LEVELS N/A 11/14/2013   Procedure: ANTERIOR CERVICAL DECOMPRESSION/DISCECTOMY FUSION 4 LEVELS;  Surgeon: Reyes Budge, MD;  Location:  [facility];  Service: Neurosurgery;  Laterality: N/A;  C34 C45 C56 C67 anterior cervical fusion with interbody prosthesis plating and  bonegraft   APPENDECTOMY  age 53   BREAST LUMPECTOMY  06/16/2009   left; SLN bx.   BREAST LUMPECTOMY  07/01/2009   re-excision   BREAST SURGERY  02/22/1997   reduction   CATARACT EXTRACTION, BILATERAL     COLONOSCOPY WITH PROPOFOL  N/A 12/03/2014   Procedure: COLONOSCOPY WITH PROPOFOL ;  Surgeon: Gladis MARLA Louder, MD;  Location: [facility];  Service: Endoscopy;  Laterality: N/A;   FOOT SURGERY  06/22/2011   left   KNEE ARTHROSCOPY  03/12/2005   right   KNEE ARTHROSCOPY     left   Lower Extremity Venous Dopplers  12/12/2020   No DVT bilaterally in the deep veins or superficial veins.  No deep or superficial venous reflux noted bilaterally with exception of Right SSV at the knee.   NASAL SINUS SURGERY     x 2   NEPHRECTOMY LIVING DONOR Left 04/22/2008   donated to spouse 2010([facility])   TRANSTHORACIC ECHOCARDIOGRAM  12/10/2020   EF 60 to 65%.  Normal LV size and function.  No heart WMA.  GR 1 DD.  Normal RV, RVP and RAP.SABRA  Normal valves.   TRIGGER FINGER RELEASE  12/16/2011   Procedure: RELEASE TRIGGER FINGER/A-1 PULLEY;  Surgeon: Franky JONELLE Curia, MD;  Location: [facility];  Service: Orthopedics;  Laterality: Right;  RIGHT LONG FINGER TRIGGER RELEASE & ANNULAR CYST EXCISION   TUMOR EXCISION  age 69   right arm   Social History   Occupational History   Occupation: Retired   Occupation: Housewife  Tobacco Use   Smoking status: Never   Smokeless tobacco: Never  Vaping Use   Vaping status: Never Used  Substance and Sexual Activity   Alcohol use: No    Alcohol/week: 0.0 standard drinks of alcohol   Drug use: No   Sexual activity: Not Currently

## 2023-11-14 DIAGNOSIS — H5000 Unspecified esotropia: Secondary | ICD-10-CM | POA: Diagnosis not present

## 2023-11-14 DIAGNOSIS — Z961 Presence of intraocular lens: Secondary | ICD-10-CM | POA: Diagnosis not present

## 2023-11-14 DIAGNOSIS — H04123 Dry eye syndrome of bilateral lacrimal glands: Secondary | ICD-10-CM | POA: Diagnosis not present

## 2023-11-14 DIAGNOSIS — H524 Presbyopia: Secondary | ICD-10-CM | POA: Diagnosis not present

## 2023-11-22 DIAGNOSIS — E785 Hyperlipidemia, unspecified: Secondary | ICD-10-CM | POA: Diagnosis not present

## 2023-11-22 DIAGNOSIS — F3341 Major depressive disorder, recurrent, in partial remission: Secondary | ICD-10-CM | POA: Diagnosis not present

## 2023-11-22 DIAGNOSIS — C50512 Malignant neoplasm of lower-outer quadrant of left female breast: Secondary | ICD-10-CM | POA: Diagnosis not present

## 2023-11-22 DIAGNOSIS — I10 Essential (primary) hypertension: Secondary | ICD-10-CM | POA: Diagnosis not present

## 2023-11-25 ENCOUNTER — Other Ambulatory Visit: Payer: Self-pay | Admitting: Neurology

## 2023-11-25 DIAGNOSIS — G250 Essential tremor: Secondary | ICD-10-CM

## 2023-12-20 ENCOUNTER — Ambulatory Visit: Admitting: Radiology

## 2023-12-20 ENCOUNTER — Other Ambulatory Visit: Payer: Self-pay

## 2023-12-20 ENCOUNTER — Ambulatory Visit
Admission: EM | Admit: 2023-12-20 | Discharge: 2023-12-20 | Disposition: A | Discharge status: Home / Self Care | Attending: Physician Assistant | Admitting: Physician Assistant

## 2023-12-20 DIAGNOSIS — R7981 Abnormal blood-gas level: Secondary | ICD-10-CM | POA: Diagnosis not present

## 2023-12-20 DIAGNOSIS — J9811 Atelectasis: Secondary | ICD-10-CM | POA: Diagnosis not present

## 2023-12-20 DIAGNOSIS — M40204 Unspecified kyphosis, thoracic region: Secondary | ICD-10-CM | POA: Diagnosis not present

## 2023-12-20 DIAGNOSIS — R112 Nausea with vomiting, unspecified: Secondary | ICD-10-CM | POA: Diagnosis not present

## 2023-12-20 DIAGNOSIS — R1110 Vomiting, unspecified: Secondary | ICD-10-CM | POA: Diagnosis not present

## 2023-12-20 DIAGNOSIS — I771 Stricture of artery: Secondary | ICD-10-CM | POA: Diagnosis not present

## 2023-12-20 DIAGNOSIS — R531 Weakness: Secondary | ICD-10-CM | POA: Diagnosis not present

## 2023-12-20 LAB — GLUCOSE, POCT (MANUAL RESULT ENTRY): POCT Glucose (KUC): 119 mg/dL — AB (ref 70–99)

## 2023-12-20 MED ORDER — ONDANSETRON 4 MG PO TBDP: 4.0000 mg | ORAL_TABLET | Freq: Three times a day (TID) | ORAL | 0 refills | Status: AC | PRN

## 2023-12-20 MED ORDER — AZITHROMYCIN 250 MG PO TABS: ORAL_TABLET | ORAL | 0 refills | Status: AC

## 2023-12-20 MED ORDER — ONDANSETRON 4 MG PO TBDP: 4.0000 mg | ORAL_TABLET | Freq: Once | ORAL | Status: DC

## 2023-12-20 MED ORDER — ONDANSETRON HCL 4 MG/2ML IJ SOLN
4.0000 mg | Freq: Once | INTRAMUSCULAR | Status: AC
  Administered 2023-12-20: 4 mg via INTRAVENOUS

## 2023-12-20 MED ORDER — SODIUM CHLORIDE 0.9 % IV BOLUS
1000.0000 mL | Freq: Once | INTRAVENOUS | Status: AC
  Administered 2023-12-20: 1000 mL via INTRAVENOUS

## 2023-12-20 NOTE — Discharge Instructions (Addendum)
 VISIT SUMMARY:  You came in today because you have been experiencing persistent nausea and vomiting for the past four weeks, along with decreased appetite, dizziness, headaches, and a mild cough. You have also noticed some confusion and difficulty concentrating. Your family is concerned about these symptoms.  YOUR PLAN:  -NAUSEA AND VOMITING WITH DEHYDRATION AND DECREASED APPETITE: Nausea and vomiting can lead to dehydration and a decreased appetite, which can cause further health issues. We will administer Zofran  to help reduce your nausea and vomiting.  We will also order blood work to check for any underlying issues.  You seem to have improved following the IV fluids as well as the IV Zofran .  I have sent in a prescription for the Zofran  for you to take as needed up to every 8 hours.  Please be advised that Zofran  can cause constipation and if this does occur you can use a stool softener or a dose of MiraLAX as needed to help relieve your symptoms.  If your nausea and vomiting return and are not responding to the Zofran  and you feel like you are getting dehydrated again or start having more severe symptoms of confusion, loss of consciousness, palpitations or chest pain please go to the emergency room.  -FATIGUE: Fatigue is likely due to dehydration and not eating enough. Addressing your nausea and vomiting should help improve your energy levels.  -HEADACHE AND DIZZINESS: Your headaches and dizziness are likely related to dehydration and not eating enough. Treating your nausea and vomiting and ensuring you get enough fluids and nutrition should help alleviate these symptoms.  -COUGH: You have a mild cough that is not currently severe or concerning. We will monitor this symptom.  Your x-ray did show signs of something called atelectasis which basically means that part of the lung has slightly collapsed usually due to hypoinflation or not breathing deeply frequently and off.  I am sending you home with a  an incentive spirometer for you to use several times per hour to help with your breathing.  I am also sending you home with an antibiotic called azithromycin to help make sure that you do not develop a bacterial infection.  INSTRUCTIONS:  Please follow up with your primary care physician on Friday for your six-month checkup. Given your current symptoms, do not wait for this appointment if your condition worsens; seek medical attention immediately. Please monitor your blood pressure at home.  It was elevated here in clinic but I suspect this is likely due to your dehydration and your difficulty with taking her medications while having nausea and vomiting.  If your blood pressure is staying elevated please bring this up to your PCP.

## 2023-12-20 NOTE — ED Notes (Addendum)
 Attempted to transport patient for x-ray. In process of changing clothes patient had 2 episodes of emesis with a reddish tint. I informed Rosina, RN and [place], [place]. We will hold off on x-ray until after Zofran  and IV are administered.

## 2023-12-20 NOTE — ED Triage Notes (Signed)
 Pt presents with complaints of nausea and vomiting x 2 days. States she is unable to keep solids or liquids down. Has only had two popsicles today. Denies pain, fevers, and sick contacts. Has been taking an OTC anti-nausea medicine with no improvement/relief. Daughter does mention headaches in appointment comments. Pt currently denies a headache in triage room.

## 2023-12-20 NOTE — ED Provider Notes (Signed)
 [facility]    CSN: [id number] Arrival date & time: 12/20/23  1548      History   Chief Complaint Chief Complaint  Patient presents with   Nausea   Emesis    HPI April Church is a 83 y.o. female.  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   HPI  Discussed the use of AI scribe software for clinical note transcription with the patient, who gave verbal consent to proceed.  The patient presents with persistent nausea and vomiting for four weeks. She is accompanied by her granddaughter   The patient has been experiencing persistent nausea and vomiting for the past four weeks, with fluctuating intensity. She has a decreased appetite and difficulty eating, consuming less than usual. She manages to drink some fluids but not as much as needed. The last vomiting episode occurred this morning, with the vomitus appearing 'bile' yesterday. No blood has been observed in the vomitus.  Associated symptoms include dizziness, headaches, and a persistent cough that is 'not bad yet'. She has not experienced fainting spells, abdominal pain, or diarrhea. She had a normal bowel movement today and is taking iron pills so her stools are dark.  She has not sought prior medical attention for these symptoms and has been using an over-the-counter medication in a 'little purple bottle' for nausea, which provides temporary relief. She feels tired of the ongoing symptoms and notes a  significant reduction in physical activity over the past few weeks, which is unusual for her.  She reports experiencing confusion and difficulty concentrating, needing to think about what day it is, which she attributes to her age. Her daughter has expressed concern about her mental state, suggesting she might be 'losing it'. No one else around her has had similar symptoms with regards to vomiting and nausea.    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 2011- no further tx. now- Dr. Melodye , Dr. Jason   Cyst of finger 11/2011   annular cyst right long finger   Dental crowns present    Dermatitis    Frequency of urination    GERD (gastroesophageal reflux disease)    Hemorrhoid    History of colon polyps 2009   Also noted 2012 and 2017 by colonoscopy.   History of shingles 1971   Had right ischial recurrence in the March 2019   Hyperlipidemia    On atorvastatin    Hypertension    under control, has been on med. x 2 yrs.   Liposarcoma of left shoulder (HCC) 1969   Parkinson's disease (HCC)    With mild tremor (neurologist Dr. Margaret), neurosurgeon Dr. Mavis   PONV (postoperative nausea and vomiting)    Seasonal allergies    Shingles of eyelid 1971   Right eyelid; recurrent -> last episode 05/11/2017   Tremors of nervous system    hands-essential tremor; associated with Parkinson's.   Trigger finger of  right hand 11/2011   long finger    Patient Active Problem List   Diagnosis Date Noted   Rectal bleeding 07/27/2022   Hemorrhoids 07/27/2022   Chronic pain of both knees 06/21/2022   Moderate persistent asthma without complication 03/27/2021   PSVT (paroxysmal supraventricular tachycardia) 11/26/2020   Syncope and collapse 11/24/2020   Primary osteoarthritis of right knee 05/12/2020    Primary osteoarthritis of left knee 05/12/2020   Hyperlipidemia    Bilateral calf pain 03/05/2019   Genetic testing 02/13/2019   Family history of ovarian cancer    Family history of bladder cancer    Family history of colon cancer    Family history of leukemia    Lumbar radiculopathy 12/08/2018   Myofascial pain 12/08/2018   Impaired gait and mobility 12/08/2018   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  Total of 59 episodes of PAT/PSVT  4 to 15 beats (not noted on diary).  Fastest was 5 beats at a rate of 193 bpm, longest was 15 beats at a rate of 106 bpm.  Rare isolated PACs (as well as couplets and triplets) with rare isolated PVCs.  No sustained arrhythmias or bradycardia to explain syncope.   ANTERIOR CERVICAL DECOMPRESSION/DISCECTOMY FUSION 4 LEVELS N/A 11/14/2013   Procedure: ANTERIOR CERVICAL DECOMPRESSION/DISCECTOMY FUSION 4 LEVELS;  Surgeon: Reyes Budge, MD;  Location: [facility];  Service: Neurosurgery;  Laterality: N/A;  C34 C45 C56 C67 anterior cervical fusion with interbody prosthesis plating and  bonegraft   APPENDECTOMY  age 89   BREAST LUMPECTOMY  06/16/2009   left; SLN bx.   BREAST LUMPECTOMY  07/01/2009   re-excision   BREAST SURGERY  02/22/1997   reduction   CATARACT EXTRACTION, BILATERAL     COLONOSCOPY WITH PROPOFOL  N/A 12/03/2014   Procedure: COLONOSCOPY WITH PROPOFOL ;  Surgeon: Gladis MARLA Louder, MD;  Location: [facility];  Service: Endoscopy;  Laterality: N/A;   FOOT SURGERY  06/22/2011   left   KNEE ARTHROSCOPY  03/12/2005   right   KNEE ARTHROSCOPY     left   Lower Extremity Venous Dopplers  12/12/2020   No DVT bilaterally in the deep veins or superficial veins.  No deep or superficial venous reflux noted bilaterally with exception of Right SSV at the knee.   NASAL SINUS SURGERY     x 2   NEPHRECTOMY LIVING DONOR Left 04/22/2008   donated to spouse 2010([facility])   TRANSTHORACIC ECHOCARDIOGRAM  12/10/2020   EF 60 to 65%.  Normal LV size and function.  No heart WMA.  GR 1 DD.  Normal RV, RVP and RAP.SABRA  Normal valves.   TRIGGER FINGER RELEASE  12/16/2011   Procedure: RELEASE TRIGGER FINGER/A-1 PULLEY;  Surgeon: Franky JONELLE Curia, MD;  Location: [facility];  Service: Orthopedics;  Laterality: Right;  RIGHT LONG FINGER TRIGGER RELEASE & ANNULAR CYST EXCISION   TUMOR EXCISION  age 35   right arm    OB  History   No obstetric history on file.      Home Medications    Prior to Admission medications   Medication Sig Start Date End Date Taking? Authorizing Provider  azithromycin (ZITHROMAX) 250 MG tablet Take 500mg  PO daily x1d and then 250mg  daily x4 days 12/20/23  Yes Shaden Higley E, PA-C  ondansetron  (ZOFRAN -ODT) 4 MG disintegrating tablet Take 1 tablet (4 mg total) by mouth every 8 (eight) hours as needed for nausea or vomiting. 12/20/23  Yes Lonna Rabold E, PA-C  albuterol  (VENTOLIN  HFA) 108 (90 Base) MCG/ACT inhaler Inhale 1-2 puffs into the lungs every 6 (six) hours as needed for wheezing or shortness of breath. 05/04/23   Jeneal Danita Macintosh, MD  amLODipine  (NORVASC ) 2.5 MG tablet Take one tablet each day. 07/14/21   Anner Alm ORN, MD  atorvastatin  (LIPITOR) 20 MG tablet Take 20 mg by mouth daily.    [provider]  budesonide -formoterol  (SYMBICORT ) 160-4.5 MCG/ACT inhaler Inhale 2 puffs into the lungs as needed. 05/04/23   Jeneal Danita Macintosh, MD  CALCIUM CARBONATE PO Take 1 tablet by mouth 2 (two) times daily.    [provider]  Cholecalciferol (VITAMIN D ) 1000 UNITS capsule Take 1,000 Units by mouth 2 (two) times daily.    [provider]  cyclobenzaprine  (FLEXERIL ) 5 MG tablet TAKE 1 TO UP TO 2 TABLETS BY MOUTH ONCE DAILY AT BEDTIME AS NEEDED FOR MUSCLE SPASM 09/08/21   Lovorn, Megan, MD  EPINEPHrine  0.3 mg/0.3 mL IJ SOAJ injection Inject 0.3 mg into the muscle as needed. 09/25/21   Jeneal Danita Macintosh, MD  famotidine  (PEPCID ) 20 MG tablet Take 1 tablet (20 mg total) by mouth daily. 05/04/23   Jeneal Danita Macintosh, MD  ipratropium (ATROVENT ) 0.06 % nasal spray Place 2 sprays into both nostrils 2 (two) times daily. 05/04/23   Jeneal Danita Macintosh, MD  ketotifen (ZADITOR) 0.025 % ophthalmic solution Apply 1 drop to eye daily.    [provider]  levocetirizine (XYZAL ) 5 MG tablet TAKE 1 TABLET BY MOUTH ONCE DAILY IN THE EVENING . APPOINTMENT REQUIRED FOR FUTURE REFILLS 05/04/23   Jeneal Danita Macintosh, MD  metoprolol  succinate (TOPROL -XL) 25 MG 24 hr tablet Take 12.5 mg by mouth daily. Take 1/2 Tablet In The Morning    [provider]  Na Sulfate-K Sulfate-Mg Sulf 17.5-3.13-1.6 GM/177ML SOLN Use as directed; may use generic; goodrx card if insurance will not cover generic 09/13/22   Federico Rosario BROCKS, MD  nystatin ointment (MYCOSTATIN) Apply 1 Application topically 2 (two) times daily.    [provider]  Polyethyl Glycol-Propyl Glycol (SYSTANE OP) Place 1 drop into both eyes 2 (two) times daily.    [provider]  primidone  (MYSOLINE ) 50 MG tablet TAKE 3 TABLETS BY MOUTH EVERY MORNING 3 TABLETS AT NOON AND 2 TABLETS EVERY NIGHT AT BEDTIME 11/25/23   Tat, Asberry RAMAN, DO  traMADol  (ULTRAM ) 50 MG tablet TAKE 1 TABLET BY MOUTH EVERY 12 HOURS AS NEEDED 06/29/22   Lovorn, Megan, MD  triamcinolone  ointment (KENALOG ) 0.1 % Apply 1 Application topically as needed.     [provider]  venlafaxine  (EFFEXOR ) 75 MG tablet Take 1 tablet (75 mg total) by mouth daily. 09/12/20   Lovorn, Megan, MD    Family History Family History  Problem Relation Age of Onset   Kidney disease Mother    Stroke Father    Stroke Sister        08/2015   Diabetes Brother  Heart disease Brother    Ovarian cancer Sister 1   Heart disease Paternal Grandmother    Heart disease Paternal Grandfather    Leukemia Other 15       brother's grandson   Bladder Cancer Brother 66   Colon cancer Niece        dx in late 26s    Social History Social History   Tobacco Use   Smoking status: Never   Smokeless tobacco: Never  Vaping Use   Vaping status: Never Used  Substance Use Topics   Alcohol use: No    Alcohol/week: 0.0 standard drinks of alcohol   Drug use: No     Allergies   Shellfish allergy, Duloxetine, Sulfa drugs cross reactors, Lisinopril, Lyrica  [pregabalin ], Other, Sulfa antibiotics, and Codeine   Review of Systems Review of Systems  Constitutional:  Positive for appetite change and fatigue.  Gastrointestinal:  Positive for nausea and vomiting.  Neurological:  Positive for dizziness and headaches. Negative for syncope.     Physical Exam Triage Vital Signs ED Triage Vitals  Encounter Vitals Group     BP 12/20/23 1606 (!) 135/103     Girls Systolic BP Percentile --      Girls Diastolic BP Percentile --      Boys Systolic BP Percentile --      Boys Diastolic BP Percentile --      Pulse Rate 12/20/23 1606 (!) 111     Resp 12/20/23 1606 18     Temp 12/20/23 1606 97.9 F (36.6 C)     Temp Source 12/20/23 1606 Oral     SpO2 12/20/23 1606 93 %     Weight 12/20/23 1608 120 lb (54.4 kg)     Height 12/20/23 1608 5' (1.524 m)     Head Circumference --      Peak Flow --      Pain Score 12/20/23 1607 0     Pain Loc --      Pain Education --      Exclude from Growth Chart --    No data found.  Updated Vital Signs BP (!) 162/99 (BP Location:  Right Arm)   Pulse 90   Temp 97.9 F (36.6 C) (Oral)   Resp 16   Ht 5' (1.524 m)   Wt 120 lb (54.4 kg)   SpO2 98%   BMI 23.44 kg/m   Visual Acuity Right Eye Distance:   Left Eye Distance:   Bilateral Distance:    Right Eye Near:   Left Eye Near:    Bilateral Near:     Physical Exam Vitals reviewed.  Constitutional:      General: She is awake.     Appearance: Normal appearance. She is well-developed and well-groomed.  HENT:     Head: Normocephalic and atraumatic.     Mouth/Throat:     Mouth: Mucous membranes are dry.  Eyes:     General: Lids are normal. Gaze aligned appropriately.     Extraocular Movements: Extraocular movements intact.     Conjunctiva/sclera: Conjunctivae normal.  Cardiovascular:     Rate and Rhythm: Normal rate and regular rhythm.     Pulses:          Radial pulses are 1+ on the right side and 1+ on the left side.     Heart sounds: Normal heart sounds. No murmur heard.    No friction rub. No gallop.  Pulmonary:     Effort: Pulmonary effort is normal.  Breath sounds: Normal breath sounds. Decreased air movement present. No decreased breath sounds, wheezing, rhonchi or rales.  Abdominal:     General: Abdomen is flat. Bowel sounds are normal.     Palpations: Abdomen is soft.     Tenderness: There is no abdominal tenderness. There is no right CVA tenderness or left CVA tenderness. Negative signs include Murphy's sign and McBurney's sign.  Skin:    General: Skin is warm and dry.  Neurological:     Mental Status: She is alert and oriented to person, place, and time.  Psychiatric:        Attention and Perception: Attention and perception normal.        Mood and Affect: Mood and affect normal.        Speech: Speech normal.        Behavior: Behavior normal. Behavior is cooperative.      UC Treatments / Results  Labs (all labs ordered are listed, but only abnormal results are displayed) Labs Reviewed  GLUCOSE, POCT (MANUAL RESULT ENTRY) -  Abnormal; Notable for the following components:      Result Value   POCT Glucose ([facility]) 119 (*)    All other components within normal limits  CBC WITH DIFFERENTIAL/PLATELET  COMPREHENSIVE METABOLIC PANEL WITH GFR  LIPASE    EKG   Radiology DG Chest 2 View Result Date: 12/20/2023 CLINICAL DATA:  Provided history: frequent vomiting and low oxygen saturation, rule out aspiration EXAM: CHEST - 2 VIEW COMPARISON:  Radiograph 09/17/2020 FINDINGS: The heart is normal in size. Aortic tortuosity. Subsegmental atelectasis at the left lung base. No confluent opacity. No pulmonary edema, pleural effusion, or pneumothorax. Scoliotic curvature of the spine. Exaggerated thoracic kyphosis. Lower cervical hardware partially included. IMPRESSION: Subsegmental atelectasis at the left lung base. Electronically Signed   By: Andrea Gasman M.D.   On: 12/20/2023 19:09    Procedures Procedures (including critical care time)  Medications Ordered in UC Medications  sodium chloride  0.9 % bolus 1,000 mL (0 mLs Intravenous Stopped 12/20/23 1900)  ondansetron  (ZOFRAN ) injection 4 mg (4 mg Intravenous Given 12/20/23 1724)    Initial Impression / Assessment and Plan / UC Course  I have reviewed the triage vital signs and the nursing notes.  Pertinent labs & imaging results that were available during my care of the patient were reviewed by me and considered in my medical decision making (see chart for details).    Patient was reassessed routinely while getting IV fluids.  On final exam at approximately 715 I checked on the patient and rechecked her physical exam.  Pulmonary findings appeared slightly improved with her taking deeper breaths and pulses were stronger at 2+ bilaterally at the radial pulse.  Tachycardia had largely resolved but blood pressure is still elevated.  I suspect this likely secondary to her difficulty with keeping medications down as well as getting off of her medication regimen due to her  current illness.  Recommend monitoring her blood pressure at home and following up with PCP for monitoring and management.  Final Clinical Impressions(s) / UC Diagnoses   Final diagnoses:  Weakness generalized  Nausea and vomiting, unspecified vomiting type  Borderline low oxygen saturation level  Atelectasis   Patient presents today with concerns for persistent nausea and vomiting.  She states that she has had similar episodes that have been ongoing for the past month but has come in today because over the last few days she has had severe nausea vomiting and is preventing her  from eating and has limited her liquid p.o. intake.  She reports weakness as well as decreased appetite and difficulty eating.  She denies obvious hematemesis or bilious emesis.  She denies abdominal pain, diarrhea or constipation and states that her most recent bowel movement was this morning and appeared normal for her.  Vitals are initially concerning for oxygen saturation of 93% as well as tachycardia and elevated blood pressure.  I am suspicious that this is likely secondary to dehydration due to persistent nausea and vomiting.  IV fluids were started, patient received approximately 1000  mL of saline which appeared to improve her fatigue and she voices that she is feeling better following this.  IV Zofran  was also administered and she was able to tolerate liquid p.o. intake as well as some peanut butter crackers.  Chest x-ray was completed to rule out potential aspiration pneumonia given reduced oxygen and decreased air movement on physical exam.  Atelectasis was noted in the left lower lobe.  Will send patient home with incentive spirometer as well as a Z-Pak to help manage potential complications and hopefully improve atelectasis.  Zofran  was also sent home with patient to assist with any persisting nausea and vomiting.  Patient's granddaughter is here and we discussed signs and symptoms of worsening condition such as  confusion, syncope, dehydration, persistent nausea and vomiting that is not responding to Zofran .  Reviewed that these would be signs to go to the emergency room.  Patient and her granddaughter voiced agreement understanding with recommendations.  Reviewed home measures to further assist with staying hydrated and preventing further nausea and vomiting.  Patient does have an upcoming appointment with her PCP on Friday and recommend strongly that she attends this for follow-up as well as for appropriate referrals as indicated.     Discharge Instructions      VISIT SUMMARY:  You came in today because you have been experiencing persistent nausea and vomiting for the past four weeks, along with decreased appetite, dizziness, headaches, and a mild cough. You have also noticed some confusion and difficulty concentrating. Your family is concerned about these symptoms.  YOUR PLAN:  -NAUSEA AND VOMITING WITH DEHYDRATION AND DECREASED APPETITE: Nausea and vomiting can lead to dehydration and a decreased appetite, which can cause further health issues. We will administer Zofran  to help reduce your nausea and vomiting.  We will also order blood work to check for any underlying issues.  You seem to have improved following the IV fluids as well as the IV Zofran .  I have sent in a prescription for the Zofran  for you to take as needed up to every 8 hours.  Please be advised that Zofran  can cause constipation and if this does occur you can use a stool softener or a dose of MiraLAX as needed to help relieve your symptoms.  If your nausea and vomiting return and are not responding to the Zofran  and you feel like you are getting dehydrated again or start having more severe symptoms of confusion, loss of consciousness, palpitations or chest pain please go to the emergency room.  -FATIGUE: Fatigue is likely due to dehydration and not eating enough. Addressing your nausea and vomiting should help improve your energy  levels.  -HEADACHE AND DIZZINESS: Your headaches and dizziness are likely related to dehydration and not eating enough. Treating your nausea and vomiting and ensuring you get enough fluids and nutrition should help alleviate these symptoms.  -COUGH: You have a mild cough that is not currently severe  or concerning. We will monitor this symptom.  Your x-ray did show signs of something called atelectasis which basically means that part of the lung has slightly collapsed usually due to hypoinflation or not breathing deeply frequently and off.  I am sending you home with a an incentive spirometer for you to use several times per hour to help with your breathing.  I am also sending you home with an antibiotic called azithromycin to help make sure that you do not develop a bacterial infection.  INSTRUCTIONS:  Please follow up with your primary care physician on Friday for your six-month checkup. Given your current symptoms, do not wait for this appointment if your condition worsens; seek medical attention immediately. Please monitor your blood pressure at home.  It was elevated here in clinic but I suspect this is likely due to your dehydration and your difficulty with taking her medications while having nausea and vomiting.  If your blood pressure is staying elevated please bring this up to your PCP.     ED Prescriptions     Medication Sig Dispense Auth. Provider   azithromycin (ZITHROMAX) 250 MG tablet Take 500mg  PO daily x1d and then 250mg  daily x4 days 6 each Nadie Fiumara E, PA-C   ondansetron  (ZOFRAN -ODT) 4 MG disintegrating tablet Take 1 tablet (4 mg total) by mouth every 8 (eight) hours as needed for nausea or vomiting. 20 tablet Ebany Bowermaster E, PA-C      PDMP not reviewed this encounter.   Jamille Yoshino E, PA-C 12/20/23 2028

## 2023-12-21 ENCOUNTER — Ambulatory Visit (HOSPITAL_COMMUNITY): Payer: Self-pay

## 2023-12-21 LAB — COMPREHENSIVE METABOLIC PANEL WITH GFR
ALT: 16 IU/L (ref 0–32)
AST: 21 IU/L (ref 0–40)
Albumin: 4.4 g/dL (ref 3.7–4.7)
Alkaline Phosphatase: 89 IU/L (ref 48–129)
BUN/Creatinine Ratio: 17 (ref 12–28)
BUN: 11 mg/dL (ref 8–27)
Bilirubin Total: 0.3 mg/dL (ref 0.0–1.2)
CO2: 22 mmol/L (ref 20–29)
Calcium: 8.7 mg/dL (ref 8.7–10.3)
Chloride: 99 mmol/L (ref 96–106)
Creatinine, Ser: 0.63 mg/dL (ref 0.57–1.00)
Globulin, Total: 2.9 g/dL (ref 1.5–4.5)
Glucose: 105 mg/dL — ABNORMAL HIGH (ref 70–99)
Potassium: 4 mmol/L (ref 3.5–5.2)
Sodium: 138 mmol/L (ref 134–144)
Total Protein: 7.3 g/dL (ref 6.0–8.5)
eGFR: 87 mL/min/1.73 (ref >59)

## 2023-12-21 LAB — CBC WITH DIFFERENTIAL/PLATELET
Basophils Absolute: 0 x10E3/uL (ref 0.0–0.2)
Basos: 0 %
EOS (ABSOLUTE): 0 x10E3/uL (ref 0.0–0.4)
Eos: 0 %
Hematocrit: 40.5 % (ref 34.0–46.6)
Hemoglobin: 13.6 g/dL (ref 11.1–15.9)
Immature Grans (Abs): 0 x10E3/uL (ref 0.0–0.1)
Immature Granulocytes: 0 %
Lymphocytes Absolute: 1.3 x10E3/uL (ref 0.7–3.1)
Lymphs: 22 %
MCH: 31.6 pg (ref 26.6–33.0)
MCHC: 33.6 g/dL (ref 31.5–35.7)
MCV: 94 fL (ref 79–97)
Monocytes Absolute: 0.6 x10E3/uL (ref 0.1–0.9)
Monocytes: 10 %
Neutrophils Absolute: 4 x10E3/uL (ref 1.4–7.0)
Neutrophils: 68 %
Platelets: 335 x10E3/uL (ref 150–450)
RBC: 4.3 x10E6/uL (ref 3.77–5.28)
RDW: 12.4 % (ref 11.7–15.4)
WBC: 6 x10E3/uL (ref 3.4–10.8)

## 2023-12-21 LAB — LIPASE: Lipase: 48 U/L (ref 14–85)

## 2023-12-23 ENCOUNTER — Other Ambulatory Visit: Payer: Self-pay | Admitting: Allergy

## 2023-12-23 DIAGNOSIS — F3341 Major depressive disorder, recurrent, in partial remission: Secondary | ICD-10-CM | POA: Diagnosis not present

## 2023-12-23 DIAGNOSIS — J9811 Atelectasis: Secondary | ICD-10-CM | POA: Diagnosis not present

## 2023-12-23 DIAGNOSIS — E785 Hyperlipidemia, unspecified: Secondary | ICD-10-CM | POA: Diagnosis not present

## 2023-12-23 DIAGNOSIS — Z23 Encounter for immunization: Secondary | ICD-10-CM | POA: Diagnosis not present

## 2023-12-23 DIAGNOSIS — I10 Essential (primary) hypertension: Secondary | ICD-10-CM | POA: Diagnosis not present

## 2023-12-23 DIAGNOSIS — R110 Nausea: Secondary | ICD-10-CM | POA: Diagnosis not present

## 2023-12-23 DIAGNOSIS — R2681 Unsteadiness on feet: Secondary | ICD-10-CM | POA: Diagnosis not present

## 2023-12-23 DIAGNOSIS — C50512 Malignant neoplasm of lower-outer quadrant of left female breast: Secondary | ICD-10-CM | POA: Diagnosis not present

## 2023-12-26 ENCOUNTER — Encounter: Payer: Self-pay | Admitting: Radiology

## 2024-01-02 ENCOUNTER — Telehealth: Payer: Self-pay | Admitting: Sports Medicine

## 2024-01-02 NOTE — Telephone Encounter (Signed)
 Pt called asking for a call from [place]. Pt has some medical questions. Please call pt at [phone].

## 2024-01-02 NOTE — Telephone Encounter (Signed)
 Returned call and left voicemail stating that she may return call to talk through her questions. Patient has an appointment scheduled tomorrow 01/03/2024 at 3:00pm.

## 2024-01-03 ENCOUNTER — Encounter: Payer: Self-pay | Admitting: Sports Medicine

## 2024-01-03 ENCOUNTER — Ambulatory Visit: Admitting: Sports Medicine

## 2024-01-03 DIAGNOSIS — M170 Bilateral primary osteoarthritis of knee: Secondary | ICD-10-CM

## 2024-01-03 DIAGNOSIS — Z905 Acquired absence of kidney: Secondary | ICD-10-CM | POA: Diagnosis not present

## 2024-01-03 DIAGNOSIS — G8929 Other chronic pain: Secondary | ICD-10-CM | POA: Diagnosis not present

## 2024-01-03 DIAGNOSIS — M25562 Pain in left knee: Secondary | ICD-10-CM | POA: Diagnosis not present

## 2024-01-03 NOTE — Progress Notes (Signed)
 April Church - 83 y.o. female MRN [id number]  Date of birth: 24-Aug-1940  Office Visit Note: Visit Date: 01/03/2024 PCP: Elliot Charm, MD Referred by: Elliot Charm,*  Subjective: Chief Complaint  Patient presents with   Right Knee - Follow-up   Left Knee - Follow-up   HPI: April Church is a pleasant 83 y.o. female who presents today for follow-up of chronic bilateral knee pain with severe osteoarthritis.  Alyssamarie is doing fairly well for both knees, her right knee is slowly starting to become painful again but is certainly still improved from previous.  The left knee feels very well.  As reminder, we did perform ultrasound-guided genicular nerve blocks for the right knee on 10/14/2023 and for the left knee on 11/04/2023.  Please were significantly helpful for her.  She uses Tylenol  as needed.  In the past, Dr. Cornelio did write her a prescription for tramadol  50 mg to take for breakthrough pain although has not needed this often.  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  In the past she did get about 1 month of relief of previous corticosteroid injection and some relief from viscosupplementation, but the genicular nerve block does seem to help more. Otherwise she is using Tylenol  for pain control.   Pertinent ROS were reviewed with the patient and found to be negative unless otherwise specified above in HPI.   Assessment & Plan: Visit Diagnoses:  1. Bilateral primary osteoarthritis of knee   2. Chronic pain of left knee   3. Single kidney    Plan: Impression is severe bilateral knee osteoarthritis which has received the most amount of pain reduction and longevity of improvement from previous ultrasound-guided genicular nerve blocks.  Both knees are still doing quite well, the left knee essentially has no pain, the right knee is slowly starting to return.  We had a discussion regarding repeating these at some point in the future.  She  has received partial relief from corticosteroid injections and/or viscosupplementation, but this has been to a lesser degree than her genicular nerve blocks. Given this, we will bring her back in about 3 weeks to repeat ultrasound-guided GNB for the right knee.  After this visit we will see how the left knee is doing and could consider follow-up or repeating the procedure in the future only if needed.  She may continue her Tylenol  as needed, she does have tramadol  50 mg she received previously from Dr. Lovvorn that she may take for breakthrough pain only, but did discuss side effect profile and to use this as only last resort.  Follow-up: Return in about 3 weeks (around 01/24/2024) for Schedule for US -guided R-knee genicular nerve block  Meds & Orders: No orders of the defined types were placed in this encounter.  No orders of the defined types were placed in this encounter.    Procedures: No procedures performed      Clinical History: No specialty comments available.  She reports that she has never smoked. She has never used smokeless tobacco. No results for input(s): HGBA1C, LABURIC in the last 8760 hours.  Objective:    Physical Exam  Gen: Well-appearing, in no acute distress; non-toxic CV: Well-perfused. Warm.  Resp: Breathing unlabored on room air; no wheezing. Psych: Fluid speech in conversation; appropriate affect; normal thought process  Ortho Exam - Bilateral knees: Trace effusion of the right knee, no effusion to the left knee.  There is restriction in range of motion of the  right and left knee with patellar crepitus noted.  There is a degree of pseudo instability with valgus testing without giveway.  Patient does walk with the assistance of a cane, slow gait.  Imaging:  *I did review bilateral knee x-rays during the visit today.  12/23/22: 4 views of bilateral knees including AP standing, Rosenberg, lateral and  sunrise view were ordered and reviewed by myself today.   X-rays show  complete bone-on-bone arthritic change of the medial tibiofemoral joint  space as well as advanced tricompartmental change.  There is notable bony  sclerosis over the medial femoral condyle and to a lesser degree the  medial tibial plateau of bilateral knees.  No acute fracture noted.  There  is varus formation of both knees.   Past Medical/Family/Surgical/Social History: Medications & Allergies reviewed per EMR, new medications updated. Patient Active Problem List   Diagnosis Date Noted   Rectal bleeding 07/27/2022   Hemorrhoids 07/27/2022   Chronic pain of both knees 06/21/2022   Moderate persistent asthma without complication 03/27/2021   PSVT (paroxysmal supraventricular tachycardia) 11/26/2020   Syncope and collapse 11/24/2020   Primary osteoarthritis of right knee 05/12/2020   Primary osteoarthritis of left knee 05/12/2020   Hyperlipidemia    Bilateral calf pain 03/05/2019   Genetic testing 02/13/2019   Family history of ovarian cancer    Family history of bladder cancer    Family history of colon cancer    Family history of leukemia    Lumbar radiculopathy 12/08/2018   Myofascial pain 12/08/2018   Impaired gait and mobility 12/08/2018   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   Chronic nonallergic rhinitis 12/29/2016   Mild persistent asthma, uncomplicated 12/29/2016   Vocal fold paralysis, 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 2011- no further tx. now- Dr. Melodye , Dr. Jason   Cyst  of finger 11/2011   annular cyst right long finger   Dental crowns present    Dermatitis    Frequency of urination    GERD (gastroesophageal reflux disease)    Hemorrhoid    History of colon polyps 2009   Also noted 2012 and 2017 by colonoscopy.   History of shingles 1971   Had right ischial recurrence in the March 2019   Hyperlipidemia    On atorvastatin    Hypertension    under control, has been on med. x 2 yrs.   Liposarcoma of left shoulder (HCC) 1969   Parkinson's disease (HCC)    With mild tremor (neurologist Dr. Margaret), neurosurgeon Dr. Mavis   PONV (postoperative nausea and vomiting)    Seasonal allergies    Shingles of eyelid 1971   Right eyelid; recurrent -> last episode 05/11/2017   Tremors of nervous system    hands-essential tremor; associated with Parkinson's.   Trigger finger of right hand 11/2011   long finger   Family History  Problem Relation Age of Onset   Kidney disease Mother    Stroke Father    Stroke Sister        08/2015   Diabetes  Brother    Heart disease Brother    Ovarian cancer Sister 51   Heart disease Paternal Grandmother    Heart disease Paternal Grandfather    Leukemia Other 15       brother's grandson   Bladder Cancer Brother 25   Colon cancer Niece        dx in late 40s   Past Surgical History:  Procedure Laterality Date   14-day Zio Patch Monitor  08/2020   (report to be scanned): Predominant SR w/ HR 61 to 142 bpm and average 86 bpm.  Total of 59 episodes of PAT/PSVT  4 to 15 beats (not noted on diary).  Fastest was 5 beats at a rate of 193 bpm, longest was 15 beats at a rate of 106 bpm.  Rare isolated PACs (as well as couplets and triplets) with rare isolated PVCs.  No sustained arrhythmias or bradycardia to explain syncope.   ANTERIOR CERVICAL DECOMPRESSION/DISCECTOMY FUSION 4 LEVELS N/A 11/14/2013   Procedure: ANTERIOR CERVICAL DECOMPRESSION/DISCECTOMY FUSION 4 LEVELS;  Surgeon: Reyes Budge, MD;  Location: [facility];   Service: Neurosurgery;  Laterality: N/A;  C34 C45 C56 C67 anterior cervical fusion with interbody prosthesis plating and  bonegraft   APPENDECTOMY  age 79   BREAST LUMPECTOMY  06/16/2009   left; SLN bx.   BREAST LUMPECTOMY  07/01/2009   re-excision   BREAST SURGERY  02/22/1997   reduction   CATARACT EXTRACTION, BILATERAL     COLONOSCOPY WITH PROPOFOL  N/A 12/03/2014   Procedure: COLONOSCOPY WITH PROPOFOL ;  Surgeon: Gladis MARLA Louder, MD;  Location: [facility];  Service: Endoscopy;  Laterality: N/A;   FOOT SURGERY  06/22/2011   left   KNEE ARTHROSCOPY  03/12/2005   right   KNEE ARTHROSCOPY     left   Lower Extremity Venous Dopplers  12/12/2020   No DVT bilaterally in the deep veins or superficial veins.  No deep or superficial venous reflux noted bilaterally with exception of Right SSV at the knee.   NASAL SINUS SURGERY     x 2   NEPHRECTOMY LIVING DONOR Left 04/22/2008   donated to spouse 2010([facility])   TRANSTHORACIC ECHOCARDIOGRAM  12/10/2020   EF 60 to 65%.  Normal LV size and function.  No heart WMA.  GR 1 DD.  Normal RV, RVP and RAP.SABRA  Normal valves.   TRIGGER FINGER RELEASE  12/16/2011   Procedure: RELEASE TRIGGER FINGER/A-1 PULLEY;  Surgeon: Franky JONELLE Curia, MD;  Location: [facility];  Service: Orthopedics;  Laterality: Right;  RIGHT LONG FINGER TRIGGER RELEASE & ANNULAR CYST EXCISION   TUMOR EXCISION  age 66   right arm   Social History   Occupational History   Occupation: Retired   Occupation: Housewife  Tobacco Use   Smoking status: Never   Smokeless tobacco: Never  Vaping Use   Vaping status: Never Used  Substance and Sexual Activity   Alcohol use: No    Alcohol/week: 0.0 standard drinks of alcohol   Drug use: No   Sexual activity: Not Currently

## 2024-01-03 NOTE — Progress Notes (Signed)
 Patient says that her knees seem to have done well with the nerve blocks. She says that the left knee is still doing pretty well, and the right knee is becoming more painful again. Her right knee block was performed on 10/14/2023, and her left knee block was performed on 11/04/2023. She denies any new injuries or falls.

## 2024-01-18 ENCOUNTER — Telehealth: Payer: Self-pay | Admitting: Allergy

## 2024-01-18 MED ORDER — BREZTRI AEROSPHERE 160-9-4.8 MCG/ACT IN AERO: 2.0000 | INHALATION_SPRAY | Freq: Two times a day (BID) | RESPIRATORY_TRACT | 5 refills | Status: AC

## 2024-01-18 NOTE — Telephone Encounter (Signed)
 Patient called stating that she received a sample of Beztri and that the medication did work for her and she stated that if it work that Dr. Jeneal let her know to call so the prescription be sent out to her pharmacy... she would like for the prescription be sent to [facility] [id number] [place], [place] - [address] [place], [place] [address] Phone: [phone]  Fax: [phone]

## 2024-01-24 ENCOUNTER — Ambulatory Visit: Admitting: Sports Medicine

## 2024-01-24 ENCOUNTER — Encounter: Payer: Self-pay | Admitting: Sports Medicine

## 2024-01-24 ENCOUNTER — Other Ambulatory Visit: Payer: Self-pay

## 2024-01-24 DIAGNOSIS — M1711 Unilateral primary osteoarthritis, right knee: Secondary | ICD-10-CM | POA: Diagnosis not present

## 2024-01-24 DIAGNOSIS — M25561 Pain in right knee: Secondary | ICD-10-CM

## 2024-01-24 DIAGNOSIS — G8929 Other chronic pain: Secondary | ICD-10-CM

## 2024-01-24 DIAGNOSIS — M170 Bilateral primary osteoarthritis of knee: Secondary | ICD-10-CM

## 2024-01-24 NOTE — Progress Notes (Signed)
    Procedure Note   Patient: April Church                                                         Date of Birth: [birth year]                                                 MRN: [id number]                                                               Visit Date: 11/04/2023  PRE-OP DIAGNOSIS: R-knee osteoarthritis PROCEDURE: Right knee genicular nerve blocks (injection) under ultrasound-guidance Performing Physician: Lonell Sprang, DO   Total Dose:      Bupivacaine  0.25%      16 mL (4 mL each location)                          Depo-medrol  40 mg/ml -  1mL (0.25 mL each location)   R/B/A of procedure provided.  Risks of infection, bleeding, damage to other structures and nerves explained.    Procedure: The area was prepped in the usual sterile manner with Chloraprep and multiple alcohol swabs. Sterile ultrasound gel was applied.  Ultrasound was used to identify the femoral shaft and condyles, along with the tibia shaft and head on the side noted above.  The inflection points were noted at the areas of the superior medial, superior lateral, inferomedial genicular nerves, as well as superior (nerve to vastus intermedius) genicular nerves. A 22-gauge, 1.5 inch needle was inserted through the skin and to the area of the nerves.  After negative aspiration, 2 ml of the above solution was injected at each of the 4 sites above using a walk-down-approach. Ultrasound demonstrates dynamic spread of the injectate along the cortex of bone and surrounding above genicular nerves. There were no complications during this procedure.   Patient reported relief upon standing after the procedure.   Follow-up: The patient tolerated the procedure well without complications.  Standard post-procedure care is explained and return precautions are given.     Lonell Sprang, DO Primary Care Sports Medicine Physician  [facility]  This note was dictated using Dragon naturally speaking software and  may contain errors in syntax, spelling, or content which have not been identified prior to signing this note.

## 2024-01-27 NOTE — Progress Notes (Unsigned)
 Assessment/Plan:   1.  Tremor  -Patient previously saw Dr. Margaret and Dr. Rosalia             -Patient with longstanding history of essential tremor             -Patient's skin biopsy was negative for alpha-synuclein             -Patient had [facility]scan at [facility] in February, 2020 and it stated that radiotracer uptake was mildly decreased bilaterally, but felt to be 2 standard deviations within age-matched controls.             -Records indicate that Dr. Rosalia did on/off testing in February, 2020 and felt that levodopa  helped tremor but did not help bradykinesia (UPDRS off score was 40 and on score was still 31).  DBS was offered and declined at that time because patient's husband was ill.  -Levodopa  was stopped in August, 2024.  The patient had some falls in January, 2025 and family wondered if related to stopping levodopa , but the falls really seemed to be falls that were truly reasonable; 1 was on uneven ground while taking out the trash and 1 occurred while tripping over a rug at a front door.  My suspicion is that the patient has significant enough essential tremor that it has affected the cerebellar climbing fibers, which can affect balance.  Nonetheless, I told the patient and her family that we can certainly retry levodopa  if they want.  Her son today does not disagree and wonders if the falls were just because she was not using her cane with either fall, and she was on an uneven surface.  He also related that she was getting older.  Regardless, they decided to hold on reinitiating levodopa .  -We decided to continue primidone  slowly 50 mg, 3/3/2  -pt now in independent assisted living in [place], [place] and wants to continue care here for now.  Offered VV   2.  Spasmodic dysphonia             -hasn't done botox for a long time.  Has done it at [facility] in the past   3.  Hemifacial spasm, left             -Not bothersome to patient.  She has a little bit of blepharospasm  as well  4.   Asks about finding new PCP in her area.  Discussed concept of direct primary care.  Subjective:   April Church was seen for tremor and gait instability.  She is with daughter Luke who supplements history.  She remains on the primidone .  Tremor is not ideally controlled, but as good as it would get without salt mention and both of us  have agreed that surgery is probably not in her best interest.  Last visit, we did discuss her living situation and she was looking into possible retirement communities.  Patient was in the urgent care October 28 after generalized weakness after a 4-week illness of nausea and vomiting.  She was given fluids and discharged and told to follow-up with her primary care physician.  Has moved now to [place] to independent living.    Current prescribed movement disorder medications: primidone  50 mg, 3/3/2  On low dose metoprolol     PREVIOUS MEDICATIONS:  primidone , up to 250 mg twice per day; carbidopa /levodopa  ; entacapone    ALLERGIES:   Allergies  Allergen Reactions   Shellfish Allergy Shortness Of Breath   Duloxetine Itching  and Rash   Sulfa Drugs Cross Reactors Anxiety and Rash   Lisinopril     Other reaction(s): cough Other reaction(s): cough Other reaction(s): cough   Lyrica  [Pregabalin ] Other (See Comments)    made me drunk, couldn't function   Other     Other reaction(s): hyper   Sulfa Antibiotics Other (See Comments)    Other reaction(s): hyper, break out in a rash Other reaction(s): hyper, break out in a rash   Codeine Anxiety    Other reaction(s): hyper Other reaction(s): hyper    CURRENT MEDICATIONS:  Current Meds  Medication Sig   albuterol  (VENTOLIN  HFA) 108 (90 Base) MCG/ACT inhaler Inhale 1-2 puffs into the lungs every 6 (six) hours as needed for wheezing or shortness of breath.   amLODipine  (NORVASC ) 2.5 MG tablet Take one tablet each day.   atorvastatin (LIPITOR) 20 MG tablet Take 20 mg by mouth daily.    azithromycin  (ZITHROMAX ) 250 MG tablet Take 500mg  PO daily x1d and then 250mg  daily x4 days   budesonide -formoterol  (SYMBICORT ) 160-4.5 MCG/ACT inhaler Inhale 2 puffs into the lungs as needed.   budesonide -glycopyrrolate -formoterol  (BREZTRI  AEROSPHERE) 160-9-4.8 MCG/ACT AERO inhaler Inhale 2 puffs into the lungs in the morning and at bedtime.   CALCIUM CARBONATE PO Take 1 tablet by mouth 2 (two) times daily.   Cholecalciferol (VITAMIN D ) 1000 UNITS capsule Take 1,000 Units by mouth 2 (two) times daily.   cyclobenzaprine  (FLEXERIL ) 5 MG tablet TAKE 1 TO UP TO 2 TABLETS BY MOUTH ONCE DAILY AT BEDTIME AS NEEDED FOR MUSCLE SPASM   EPINEPHrine  0.3 mg/0.3 mL IJ SOAJ injection Inject 0.3 mg into the muscle as needed.   famotidine  (PEPCID ) 20 MG tablet TAKE 1 TABLET BY MOUTH DAILY   ipratropium (ATROVENT ) 0.06 % nasal spray Place 2 sprays into both nostrils 2 (two) times daily.   ketotifen (ZADITOR) 0.025 % ophthalmic solution Apply 1 drop to eye daily.   levocetirizine (XYZAL ) 5 MG tablet TAKE 1 TABLET BY MOUTH ONCE DAILY IN THE EVENING . APPOINTMENT REQUIRED FOR FUTURE REFILLS   metoprolol  succinate (TOPROL -XL) 25 MG 24 hr tablet Take 25 mg by mouth daily.   Na Sulfate-K Sulfate-Mg Sulf 17.5-3.13-1.6 GM/177ML SOLN Use as directed; may use generic; goodrx card if insurance will not cover generic   nystatin ointment (MYCOSTATIN) Apply 1 Application topically 2 (two) times daily.   ondansetron  (ZOFRAN -ODT) 4 MG disintegrating tablet Take 1 tablet (4 mg total) by mouth every 8 (eight) hours as needed for nausea or vomiting.   Polyethyl Glycol-Propyl Glycol (SYSTANE OP) Place 1 drop into both eyes 2 (two) times daily.   primidone  (MYSOLINE ) 50 MG tablet TAKE 3 TABLETS BY MOUTH EVERY MORNING 3 TABLETS AT NOON AND 2 TABLETS EVERY NIGHT AT BEDTIME   traMADol  (ULTRAM ) 50 MG tablet TAKE 1 TABLET BY MOUTH EVERY 12 HOURS AS NEEDED   triamcinolone  ointment (KENALOG ) 0.1 % Apply 1 Application topically as needed.    venlafaxine  (EFFEXOR ) 75 MG tablet Take 1 tablet (75 mg total) by mouth daily.   [DISCONTINUED] metoprolol  succinate (TOPROL -XL) 25 MG 24 hr tablet Take 12.5 mg by mouth daily. Take 1/2 Tablet In The Morning     Objective:   PHYSICAL EXAMINATION:    VITALS:   Vitals:   01/31/24 1252  BP: 126/88  Pulse: 73  SpO2: 98%  Weight: 120 lb 12.8 oz (54.8 kg)     GEN:  The patient appears stated age and is in NAD. HEENT:  Normocephalic, AT.  The mucous  membranes are moist. The superficial temporal arteries are without ropiness or tenderness. CV:  RRR Lungs:  CTAB Neck/HEME:  There are no carotid bruits bilaterally.  Neurological examination:  Orientation: The patient is alert and oriented x3.  She is able to provide her history very well Cranial nerves: There is good facial symmetry (with L pseudoptosis since her fall) without facial hypomimia. The speech is fluent and clear.  She has baseline spasmodic dysphonia.  Soft palate rises symmetrically and there is no tongue deviation. Hearing is intact to conversational tone. Sensation: Sensation is intact to light touch throughout Motor: Strength is at least antigravity x4.  Movement examination: Tone: There is nl tone in the bilateral upper extremities.  The tone in the lower extremities is nl.  Abnormal movements: There is mild left hemifacial spasm.  There is no rest tremor today. Has postural tremor, R>L Coordination: Rapid alternating movements are good today. Gait and Station: The patient pushes off to arise.  The patient's stride length is decreased but no shuffling.  She ambulates with 3 wheeled walker.  Is stable with this  I have reviewed and interpreted the following labs independently    Chemistry      Component Value Date/Time   NA 138 12/20/2023 1923   NA 136 01/20/2015 1326   K 4.0 12/20/2023 1923   K 4.1 01/20/2015 1326   CL 99 12/20/2023 1923   CL 102 06/30/2012 1231   CO2 22 12/20/2023 1923   CO2 27 01/20/2015  1326   BUN 11 12/20/2023 1923   BUN 13.9 01/20/2015 1326   CREATININE 0.63 12/20/2023 1923   CREATININE 0.9 01/20/2015 1326      Component Value Date/Time   CALCIUM 8.7 12/20/2023 1923   CALCIUM 8.8 01/20/2015 1326   ALKPHOS 89 12/20/2023 1923   ALKPHOS 86 01/20/2015 1326   AST 21 12/20/2023 1923   AST 17 01/20/2015 1326   ALT 16 12/20/2023 1923   ALT 13 01/20/2015 1326   BILITOT 0.3 12/20/2023 1923   BILITOT <0.30 01/20/2015 1326       Lab Results  Component Value Date   WBC 6.0 12/20/2023   HGB 13.6 12/20/2023   HCT 40.5 12/20/2023   MCV 94 12/20/2023   PLT 335 12/20/2023    No results found for: TSH   Total time spent on today's visit was 37 minutes, including both face-to-face time and nonface-to-face time.  Time included that spent on review of records (prior notes available to me/labs/imaging if pertinent), discussing treatment and goals, answering patient's questions and coordinating care.  Cc:  Elliot Charm, MD

## 2024-01-31 ENCOUNTER — Ambulatory Visit (INDEPENDENT_AMBULATORY_CARE_PROVIDER_SITE_OTHER): Admitting: Neurology

## 2024-01-31 DIAGNOSIS — G250 Essential tremor: Secondary | ICD-10-CM | POA: Diagnosis not present

## 2024-01-31 MED ORDER — PRIMIDONE 50 MG PO TABS: ORAL_TABLET | ORAL | 1 refills | Status: AC

## 2024-01-31 NOTE — Patient Instructions (Signed)
 VISIT SUMMARY: Today, we discussed your recent episodes of nausea and vomiting, as well as your current medications and overall health. You have been adjusting to your new living environment and managing your daily activities independently. We also reviewed your blood pressure readings and the use of your walker.  YOUR PLAN: -ESSENTIAL TREMOR: Essential tremor is a nervous system disorder that causes involuntary and rhythmic shaking. You are currently managing this with primidone . Continue taking primidone  as prescribed: three tablets in the morning, three at noon, and two at night. We have sent your prescription to [facility] pharmacy in [place]. Additionally, ensure you take metoprolol  at night as prescribed to manage your heart rate and blood pressure. We will check the accuracy of your home blood pressure cuff by comparing its readings with those taken in the office.  INSTRUCTIONS: Please continue taking your medications as prescribed. We will check the accuracy of your home blood pressure cuff at your next visit. If you experience any more episodes of nausea, vomiting, or lightheadedness, please contact our office.  Look up direct primary care (sometimes called concierge care) in your area  The physicians and staff at [facility] are committed to providing excellent care. You may receive a survey requesting feedback about your experience at our office. We strive to receive very good responses to the survey questions. If you feel that your experience would prevent you from giving the office a very good  response, please contact our office to try to remedy the situation. We may be reached at [phone]. Thank you for taking the time out of your busy day to complete the survey.                       Contains text generated by Abridge.                                 Contains text generated by Abridge.

## 2024-02-28 ENCOUNTER — Ambulatory Visit (INDEPENDENT_AMBULATORY_CARE_PROVIDER_SITE_OTHER): Admitting: Sports Medicine

## 2024-02-28 ENCOUNTER — Encounter: Payer: Self-pay | Admitting: Sports Medicine

## 2024-02-28 ENCOUNTER — Other Ambulatory Visit: Payer: Self-pay

## 2024-02-28 DIAGNOSIS — Z905 Acquired absence of kidney: Secondary | ICD-10-CM

## 2024-02-28 DIAGNOSIS — M170 Bilateral primary osteoarthritis of knee: Secondary | ICD-10-CM | POA: Diagnosis not present

## 2024-02-28 DIAGNOSIS — M25562 Pain in left knee: Secondary | ICD-10-CM | POA: Diagnosis not present

## 2024-02-28 NOTE — Progress Notes (Signed)
 Patient says that her right knee was doing well, although in the last few days she has felt a catch in the front of the knee. She says that the left knee has felt okay, but she is interested in procedure for the left knee today if recommended.

## 2024-02-28 NOTE — Progress Notes (Signed)
 "  April Church - 84 y.o. female MRN [id number]  Date of birth: 1941/01/07  Office Visit Note: Visit Date: 02/28/2024 PCP: April Charm, MD Referred by: April Charm, MD  Subjective: Chief Complaint  Patient presents with   Right Knee - Follow-up   Left Knee - Follow-up   HPI: April Church is a pleasant 84 y.o. female who presents today for follow-up of chronic bilateral knee pain with advanced OA.  April Church has a long history of bilateral knee pain with severe osteoarthritis.  She has done well more recently with genicular nerve blocks, the last one for the right knee was about 5 weeks ago.  Had significant pain relief with this although just in the last day or 2 she is feeling more of a catching and pain over the anterior aspect of the right knee.  The left knee overall is still doing rather well.  She uses Tylenol  as needed as well as Voltaren gel.  Previous interventions: - US -guided genicular nerve blocks for the right knee on 10/14/2023, 01/24/24 and for the left knee on 11/04/2023  - these were significantly helpful for her and relief lasted at least twice as long as other regular corticosteroid and Visco injections.  Pertinent ROS were reviewed with the patient and found to be negative unless otherwise specified above in HPI.   Assessment & Plan: Visit Diagnoses:  1. Bilateral primary osteoarthritis of knee   2. Patellofemoral arthralgia of left knee   3. Single kidney    Plan: Impression is severe bilateral knee osteoarthritis with right greater than left.  She has received relief from GNB for both knees, but too soon for right knee.  More of the right knee pain does seem to be emanating from her patellofemoral joint with rather notable crepitus.  Given this and previous response, I would like to set her up for viscosupplementation for both the right and left knee, authorization sent today.  We discussed the nature of her continuing to pain active  to strengthen the quadriceps and help offload the knees, she will continue walking up and down her independent living hallway multiple times a day with the assistance of her walker. She may continue her Tylenol  as needed, recommended increasing Voltaren gel to 3 times daily. She does have tramadol  50 mg she received previously from April Church that she may take for breakthrough pain only. Avoid NSAIDs given solitary kidney. Once she is approved for viscosupplementation, we will notify her to proceed as for her bilateral knees.  Follow-up: Return for Will return once visco injections approved .   Meds & Orders: No orders of the defined types were placed in this encounter.   Orders Placed This Encounter  Procedures   US  Guided Needle Placement - No Linked Charges   Ambulatory request for injection medication     Procedures: No procedures performed      Clinical History: No specialty comments available.  She reports that she has never smoked. She has never used smokeless tobacco. No results for input(s): HGBA1C, LABURIC in the last 8760 hours.  Objective:    Physical Exam  Gen: Well-appearing, in no acute distress; non-toxic CV: Well-perfused. Warm.  Resp: Breathing unlabored on room air; no wheezing. Psych: Fluid speech in conversation; appropriate affect; normal thought process  Ortho Exam - Bilateral knees: Trace effusion about right knee, minimal on the left knee.  Range of motion of the right knee shows range of motion from 3-90 degrees with firm endpoint and  bony blocking, left knee 0-115 degrees.  There is notable patellofemoral crepitus R > L.  Patient ambulates with the assistance of a tripod-rolling walker.  Imaging:  12/23/22: 4 views of bilateral knees including AP standing, Rosenberg, lateral and  sunrise view were ordered and reviewed by myself today.  X-rays show  complete bone-on-bone arthritic change of the medial tibiofemoral joint  space as well as advanced  tricompartmental change.  There is notable bony  sclerosis over the medial femoral condyle and to a lesser degree the  medial tibial plateau of bilateral knees.  No acute fracture noted.  There  is varus formation of both knees.   Past Medical/Family/Surgical/Social History: Medications & Allergies reviewed per EMR, new medications updated. Patient Active Problem List   Diagnosis Date Noted   Rectal bleeding 07/27/2022   Hemorrhoids 07/27/2022   Chronic pain of both knees 06/21/2022   Moderate persistent asthma without complication 03/27/2021   PSVT (paroxysmal supraventricular tachycardia) 11/26/2020   Syncope and collapse 11/24/2020   Primary osteoarthritis of right knee 05/12/2020   Primary osteoarthritis of left knee 05/12/2020   Hyperlipidemia    Bilateral calf pain 03/05/2019   Genetic testing 02/13/2019   Family history of ovarian cancer    Family history of bladder cancer    Family history of colon cancer    Family history of leukemia    Lumbar radiculopathy 12/08/2018   Myofascial pain 12/08/2018   Impaired gait and mobility 12/08/2018   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   Chronic nonallergic rhinitis 12/29/2016   Mild persistent asthma, uncomplicated 12/29/2016   Vocal fold paralysis, 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 2011- no further tx. now- Dr. Melodye , Dr. Jason   Cyst of finger 11/2011   annular cyst right long finger   Dental crowns present    Dermatitis    Frequency of  urination    GERD (gastroesophageal reflux disease)    Hemorrhoid    History of colon polyps 2009   Also noted 2012 and 2017 by colonoscopy.   History of shingles 1971   Had right ischial recurrence in the March 2019   Hyperlipidemia    On atorvastatin   Hypertension    under control, has been on med. x 2 yrs.   Liposarcoma of left shoulder (HCC) 1969   Parkinson's disease (HCC)    With mild tremor (neurologist Dr. Margaret), neurosurgeon Dr. Mavis   PONV (postoperative nausea and vomiting)    Seasonal allergies    Shingles of eyelid 1971   Right eyelid; recurrent -> last episode 05/11/2017   Tremors of nervous system    hands-essential tremor; associated with Parkinson's.   Trigger finger of right hand 11/2011   long finger   Family History  Problem Relation Age of Onset   Kidney disease Mother    Stroke Father    Stroke Sister        08/2015   Diabetes Brother    Heart disease Brother    Ovarian cancer Sister 87   Heart disease Paternal  Grandmother    Heart disease Paternal Grandfather    Leukemia Other 15       brother's grandson   Bladder Cancer Brother 3   Colon cancer Niece        dx in late 17s   Past Surgical History:  Procedure Laterality Date   14-day Zio Patch Monitor  08/2020   (report to be scanned): Predominant SR w/ HR 61 to 142 bpm and average 86 bpm.  Total of 59 episodes of PAT/PSVT  4 to 15 beats (not noted on diary).  Fastest was 5 beats at a rate of 193 bpm, longest was 15 beats at a rate of 106 bpm.  Rare isolated PACs (as well as couplets and triplets) with rare isolated PVCs.  No sustained arrhythmias or bradycardia to explain syncope.   ANTERIOR CERVICAL DECOMPRESSION/DISCECTOMY FUSION 4 LEVELS N/A 11/14/2013   Procedure: ANTERIOR CERVICAL DECOMPRESSION/DISCECTOMY FUSION 4 LEVELS;  Surgeon: Reyes Budge, MD;  Location: [facility];  Service: Neurosurgery;  Laterality: N/A;  C34 C45 C56 C67 anterior cervical fusion with interbody prosthesis  plating and  bonegraft   APPENDECTOMY  age 36   BREAST LUMPECTOMY  06/16/2009   left; SLN bx.   BREAST LUMPECTOMY  07/01/2009   re-excision   BREAST SURGERY  02/22/1997   reduction   CATARACT EXTRACTION, BILATERAL     COLONOSCOPY WITH PROPOFOL  N/A 12/03/2014   Procedure: COLONOSCOPY WITH PROPOFOL ;  Surgeon: Gladis MARLA Louder, MD;  Location: [facility];  Service: Endoscopy;  Laterality: N/A;   FOOT SURGERY  06/22/2011   left   KNEE ARTHROSCOPY  03/12/2005   right   KNEE ARTHROSCOPY     left   Lower Extremity Venous Dopplers  12/12/2020   No DVT bilaterally in the deep veins or superficial veins.  No deep or superficial venous reflux noted bilaterally with exception of Right SSV at the knee.   NASAL SINUS SURGERY     x 2   NEPHRECTOMY LIVING DONOR Left 04/22/2008   donated to spouse 2010([facility])   TRANSTHORACIC ECHOCARDIOGRAM  12/10/2020   EF 60 to 65%.  Normal LV size and function.  No heart WMA.  GR 1 DD.  Normal RV, RVP and RAP.SABRA  Normal valves.   TRIGGER FINGER RELEASE  12/16/2011   Procedure: RELEASE TRIGGER FINGER/A-1 PULLEY;  Surgeon: Franky JONELLE Curia, MD;  Location: [facility];  Service: Orthopedics;  Laterality: Right;  RIGHT LONG FINGER TRIGGER RELEASE & ANNULAR CYST EXCISION   TUMOR EXCISION  age 39   right arm   Social History   Occupational History   Occupation: Retired   Occupation: Housewife  Tobacco Use   Smoking status: Never   Smokeless tobacco: Never  Vaping Use   Vaping status: Never Used  Substance and Sexual Activity   Alcohol use: No    Alcohol/week: 0.0 standard drinks of alcohol   Drug use: No   Sexual activity: Not Currently   "

## 2024-03-05 ENCOUNTER — Telehealth: Payer: Self-pay | Admitting: Sports Medicine

## 2024-03-05 NOTE — Telephone Encounter (Signed)
 Pt called to see if she has been approved for gel injection. Please call pt at [phone].

## 2024-03-07 NOTE — Telephone Encounter (Signed)
 Will call.

## 2024-03-08 NOTE — Telephone Encounter (Signed)
 Left VM for patient to CB to schedule for gel injection with Dr. Burnetta.

## 2024-03-14 ENCOUNTER — Ambulatory Visit: Admitting: Physician Assistant

## 2024-03-22 ENCOUNTER — Encounter: Payer: Self-pay | Admitting: Sports Medicine

## 2024-03-22 ENCOUNTER — Ambulatory Visit: Admitting: Sports Medicine

## 2024-03-22 DIAGNOSIS — M170 Bilateral primary osteoarthritis of knee: Secondary | ICD-10-CM | POA: Diagnosis not present

## 2024-03-22 DIAGNOSIS — Z905 Acquired absence of kidney: Secondary | ICD-10-CM

## 2024-03-22 DIAGNOSIS — I872 Venous insufficiency (chronic) (peripheral): Secondary | ICD-10-CM | POA: Diagnosis not present

## 2024-03-22 MED ORDER — HYALURONAN 88 MG/4ML IX SOSY
88.0000 mg | PREFILLED_SYRINGE | INTRA_ARTICULAR | Status: AC | PRN
  Administered 2024-03-22: 88 mg via INTRA_ARTICULAR

## 2024-03-22 MED ORDER — LIDOCAINE HCL 1 % IJ SOLN
2.0000 mL | INTRAMUSCULAR | Status: AC | PRN
  Administered 2024-03-22: 2 mL

## 2024-03-22 MED ORDER — BUPIVACAINE HCL 0.25 % IJ SOLN
2.0000 mL | INTRAMUSCULAR | Status: AC | PRN
  Administered 2024-03-22: 2 mL via INTRA_ARTICULAR

## 2024-03-22 NOTE — Progress Notes (Signed)
 "  April Church - 84 y.o. female MRN [id number]  Date of birth: May 27, 1940  Office Visit Note: Visit Date: 03/22/2024 PCP: Elliot Charm, MD Referred by: Elliot Charm, MD  Subjective: Chief Complaint  Patient presents with   Right Knee - Pain   Left Knee - Pain   HPI: April Church is a pleasant 84 y.o. female who presents today for chronic bilateral knee OA. Here for viscosupplementation. Also has concerns about lower extremity swelling.  Discussed the use of AI scribe software for clinical note transcription with the patient, who gave verbal consent to proceed.  History of Present Illness April Church is an 84 year old female with bilateral knee osteoarthritis who presents for gel injections and evaluation of new lower extremity swelling.  She is here for scheduled viscosupplementation to both knees for chronic osteoarthritic pain with ongoing functional limitations. She uses acetaminophen  and ice for partial relief and has had multiple prior knee procedures.  Over the past week she developed bilateral lower extremity edema from mid to upper shins to the feet, with possibly greater swelling on the right. Swelling is worst at the end of the day and resolves by morning. She denies leg or calf pain, tenderness, erythema, shortness of breath, or exertional dyspnea, and has no history of venous thromboembolism. She has not tried compression stockings. Her activity level is stable and she ambulates in her building several times daily with a walker.   Pertinent ROS were reviewed with the patient and found to be negative unless otherwise specified above in HPI.   Assessment & Plan: Visit Diagnoses:  1. Bilateral primary osteoarthritis of knee   2. Single kidney   3. Venous insufficiency (chronic) (peripheral)    Assessment & Plan Bilateral primary osteoarthritis of knee Radiographs and symptomatology show advanced, essentially bone-on-bone arthritic  change bilaterally.  Not currently pursuing knee replacement.  Had received benefit short-term from corticosteroid injection, previous viscosupplementation injection, as well as genicular nerve block.  All of these have given her relief for partial.  Last resort would be bilateral TKA, although I do not think she would be an excellent candidate for this and would save this as a last resort. - Gel injections (Monovisc) administered today. No immediate complications post-injection.  Postinjection protocol discussed.  Procedure note below - Advised gel injections may require 1-2 weeks for effect and may cause transient increased fullness or soreness. - Recommended Tylenol  and ice application for 15-20 minutes to manage post-injection soreness. - Advised use of Voltaren gel as needed for knee discomfort. -Needs to avoid NSAIDs given solitary kidney as well as not worsening lower extremity edema/venous insufficiency - Instructed to resume normal activities by end of weekend. - Scheduled follow-up in 3 months; instructed to call sooner if symptoms worsen or change.  Chronic venous insufficiency of lower extremities New dependent pedal edema bilaterally, worse at day's end, resolving overnight. No evidence of deep vein thrombosis. Edema attributed to chronic venous insufficiency. - Recommended daily leg elevation for 15-20 minutes in the evening to reduce swelling. - Advised use of compression socks during daytime to improve venous return and minimize edema; instructed not to wear during sleep. - Provided education on obtaining compression socks from local pharmacies or online. - Encouraged notification of primary care provider if symptoms change or worsen.  Would recommend follow-up with PCP, but could consider venous duplex ultrasound for vein and vascular valve function.  Low suspicion for DVT at this time.  Completed workup would be echocardiogram,  but would leave this up to PCP.  *Additional  considerations: Knee CSI, repeating Genicular nerve block procedures --> RFA  Follow-up: Return in about 3 months (around 06/20/2024) for b/l knees.   Meds & Orders: No orders of the defined types were placed in this encounter.   Orders Placed This Encounter  Procedures   Large Joint Inj: R knee   Large Joint Inj: L knee     Procedures: Large Joint Inj: R knee on 03/22/2024 2:28 PM Indications: pain Details: 22 G 1.5 in needle, anterolateral approach Medications: 2 mL lidocaine  1 %; 2 mL bupivacaine  0.25 %; 88 mg Hyaluronan 88 MG/4ML Outcome: tolerated well, no immediate complications  *Procedure really successful right knee Monovisc injection Procedure, treatment alternatives, risks and benefits explained, specific risks discussed. Consent was given by the patient. Patient was prepped and draped in the usual sterile fashion.    Large Joint Inj: L knee on 03/22/2024 2:29 PM Indications: pain Details: 22 G 1.5 in needle, anterolateral approach Medications: 2 mL lidocaine  1 %; 2 mL bupivacaine  0.25 %; 88 mg Hyaluronan 88 MG/4ML Outcome: tolerated well, no immediate complications  *Procedurally successful left knee Monovisc injection Procedure, treatment alternatives, risks and benefits explained, specific risks discussed. Consent was given by the patient. Patient was prepped and draped in the usual sterile fashion.          Clinical History: No specialty comments available.  She reports that she has never smoked. She has never used smokeless tobacco. No results for input(s): HGBA1C, LABURIC in the last 8760 hours.  Objective:    Physical Exam  Gen: Well-appearing, in no acute distress; non-toxic CV: Well-perfused. Warm.  Resp: Breathing unlabored on room air; no wheezing. Psych: Fluid speech in conversation; appropriate affect; normal thought process  *MSK/Ortho Exam: Physical Exam EXTREMITIES: Pitting edema from mid to upper shin down to feet bilaterally. Pulses  intact bilaterally. Legs symmetrical, no redness, non-tender bilaterally.  *Bilateral knees: Minimal to trace effusion bilaterally.  Range of motion is restricted right knee 3-90 degrees with bony blocking, left knee 0-110 degrees.  There is notable patellofemoral crepitus.  Patient ambulates with the assistance of a rolling walker.  *Legs: There is trace pitting edema in the mid shin to tibia with 1+ over the dorsum of the feet bilaterally.  There is 2+ posterior tibial pulses palpable bilaterally.  There is no redness or cellulitis within the calfs.  Negative Homans' sign bilaterally.  Calf circumference is equal bilaterally.  Imaging:  12/23/2022: 4 views of bilateral knees including AP standing, Rosenberg, lateral and sunrise view were ordered and reviewed by myself today. X-rays show complete bone-on-bone arthritic change of the medial tibiofemoral joint space as well as advanced tricompartmental change. There is notable bony sclerosis over the medial femoral condyle and to a lesser degree the medial tibial plateau of bilateral knees. No acute fracture noted. There is varus formation of both knees.   Past Medical/Family/Surgical/Social History: Medications & Allergies reviewed per EMR, new medications updated. Patient Active Problem List   Diagnosis Date Noted   Rectal bleeding 07/27/2022   Hemorrhoids 07/27/2022   Chronic pain of both knees 06/21/2022   Moderate persistent asthma without complication 03/27/2021   PSVT (paroxysmal supraventricular tachycardia) 11/26/2020   Syncope and collapse 11/24/2020   Primary osteoarthritis of right knee 05/12/2020   Primary osteoarthritis of left knee 05/12/2020   Hyperlipidemia    Bilateral calf pain 03/05/2019   Genetic testing 02/13/2019   Family history of ovarian cancer  Family history of bladder cancer    Family history of colon cancer    Family history of leukemia    Lumbar radiculopathy 12/08/2018   Myofascial pain 12/08/2018    Impaired gait and mobility 12/08/2018   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   Chronic nonallergic rhinitis 12/29/2016   Mild persistent asthma, uncomplicated 12/29/2016   Vocal fold paralysis, 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 2011- no further tx. now- Dr. Melodye , Dr. Jason   Cyst of finger 11/2011   annular cyst right long finger   Dental crowns present    Dermatitis    Frequency of urination    GERD (gastroesophageal reflux disease)    Hemorrhoid    History of colon polyps 2009   Also noted 2012 and 2017 by colonoscopy.   History of shingles 1971   Had right ischial recurrence in the March 2019   Hyperlipidemia    On atorvastatin   Hypertension    under control, has been on med. x 2 yrs.   Liposarcoma of left shoulder (HCC) 1969   Parkinson's disease (HCC)    With mild tremor (neurologist Dr. Margaret), neurosurgeon Dr. Mavis   PONV (postoperative nausea and vomiting)    Seasonal allergies    Shingles of eyelid 1971   Right eyelid; recurrent -> last episode 05/11/2017   Tremors of nervous system    hands-essential tremor; associated with Parkinson's.   Trigger finger of right hand 11/2011   long finger   Family History  Problem Relation Age of Onset   Kidney disease Mother    Stroke Father    Stroke Sister        08/2015   Diabetes Brother    Heart disease Brother    Ovarian cancer Sister 68   Heart disease Paternal Grandmother    Heart disease Paternal Grandfather    Leukemia  Other 15       brother's grandson   Bladder Cancer Brother 68   Colon cancer Niece        dx in late 30s   Past Surgical History:  Procedure Laterality Date   14-day Zio Patch Monitor  08/2020   (report to be scanned): Predominant SR w/ HR 61 to 142 bpm and average 86 bpm.  Total of 59 episodes of PAT/PSVT  4 to 15 beats (not noted on diary).  Fastest was 5 beats at a rate of 193 bpm, longest was 15 beats at a rate of 106 bpm.  Rare isolated PACs (as well as couplets and triplets) with rare isolated PVCs.  No sustained arrhythmias or bradycardia to explain syncope.   ANTERIOR CERVICAL DECOMPRESSION/DISCECTOMY FUSION 4 LEVELS N/A 11/14/2013   Procedure: ANTERIOR CERVICAL DECOMPRESSION/DISCECTOMY FUSION 4 LEVELS;  Surgeon: Reyes Mavis, MD;  Location: [facility];  Service: Neurosurgery;  Laterality: N/A;  C34 C45 C56 C67 anterior cervical fusion with interbody prosthesis plating and  bonegraft   APPENDECTOMY  age 51   BREAST LUMPECTOMY  06/16/2009   left; SLN bx.   BREAST LUMPECTOMY  07/01/2009  re-excision   BREAST SURGERY  02/22/1997   reduction   CATARACT EXTRACTION, BILATERAL     COLONOSCOPY WITH PROPOFOL  N/A 12/03/2014   Procedure: COLONOSCOPY WITH PROPOFOL ;  Surgeon: Gladis MARLA Louder, MD;  Location: [facility];  Service: Endoscopy;  Laterality: N/A;   FOOT SURGERY  06/22/2011   left   KNEE ARTHROSCOPY  03/12/2005   right   KNEE ARTHROSCOPY     left   Lower Extremity Venous Dopplers  12/12/2020   No DVT bilaterally in the deep veins or superficial veins.  No deep or superficial venous reflux noted bilaterally with exception of Right SSV at the knee.   NASAL SINUS SURGERY     x 2   NEPHRECTOMY LIVING DONOR Left 04/22/2008   donated to spouse 2010([facility])   TRANSTHORACIC ECHOCARDIOGRAM  12/10/2020   EF 60 to 65%.  Normal LV size and function.  No heart WMA.  GR 1 DD.  Normal RV, RVP and RAP.SABRA  Normal valves.   TRIGGER FINGER RELEASE  12/16/2011   Procedure: RELEASE  TRIGGER FINGER/A-1 PULLEY;  Surgeon: Franky JONELLE Curia, MD;  Location: [facility];  Service: Orthopedics;  Laterality: Right;  RIGHT LONG FINGER TRIGGER RELEASE & ANNULAR CYST EXCISION   TUMOR EXCISION  age 27   right arm   Social History   Occupational History   Occupation: Retired   Occupation: Housewife  Tobacco Use   Smoking status: Never   Smokeless tobacco: Never  Vaping Use   Vaping status: Never Used  Substance and Sexual Activity   Alcohol use: No    Alcohol/week: 0.0 standard drinks of alcohol   Drug use: No   Sexual activity: Not Currently   "

## 2024-03-22 NOTE — Progress Notes (Signed)
 Patient says that her knee pain has worsened since her last visit especially the right knee. She is here for bilateral gel injections today. She does mention that she has had swelling in the knees and lower legs for the last two weeks, which she has not experienced in the past. The swelling is worse in the right leg than the left, and for both is worse at the end of the day. She says that her daughter has also noticed this swelling. She denies having any injury, or activity/medication change in the last couple of weeks. She has elevated the legs, but otherwise not done anything to treat these symptoms.

## 2024-04-03 ENCOUNTER — Ambulatory Visit: Admitting: Physician Assistant

## 2024-04-25 ENCOUNTER — Ambulatory Visit: Admitting: Allergy

## 2024-06-28 ENCOUNTER — Ambulatory Visit: Admitting: Sports Medicine

## 2024-07-31 ENCOUNTER — Ambulatory Visit: Admitting: Neurology
